# Patient Record
Sex: Female | Born: 1960 | Race: Black or African American | Hispanic: No | Marital: Single | Smoking: Former smoker
Health system: Southern US, Community
[De-identification: ages and names within clinical notes are randomized; demographics above are authoritative.]

## PROBLEM LIST (undated history)

## (undated) DIAGNOSIS — E785 Hyperlipidemia, unspecified: Secondary | ICD-10-CM

## (undated) DIAGNOSIS — I340 Nonrheumatic mitral (valve) insufficiency: Secondary | ICD-10-CM

## (undated) DIAGNOSIS — E872 Acidosis, unspecified: Secondary | ICD-10-CM

## (undated) DIAGNOSIS — I219 Acute myocardial infarction, unspecified: Secondary | ICD-10-CM

## (undated) DIAGNOSIS — D649 Anemia, unspecified: Secondary | ICD-10-CM

## (undated) DIAGNOSIS — G8929 Other chronic pain: Secondary | ICD-10-CM

## (undated) DIAGNOSIS — K649 Unspecified hemorrhoids: Secondary | ICD-10-CM

## (undated) DIAGNOSIS — E119 Type 2 diabetes mellitus without complications: Secondary | ICD-10-CM

## (undated) DIAGNOSIS — M2550 Pain in unspecified joint: Secondary | ICD-10-CM

## (undated) DIAGNOSIS — F419 Anxiety disorder, unspecified: Secondary | ICD-10-CM

## (undated) DIAGNOSIS — M4802 Spinal stenosis, cervical region: Secondary | ICD-10-CM

## (undated) DIAGNOSIS — E871 Hypo-osmolality and hyponatremia: Secondary | ICD-10-CM

## (undated) DIAGNOSIS — S92309A Fracture of unspecified metatarsal bone(s), unspecified foot, initial encounter for closed fracture: Secondary | ICD-10-CM

## (undated) DIAGNOSIS — I10 Essential (primary) hypertension: Secondary | ICD-10-CM

## (undated) DIAGNOSIS — Z5189 Encounter for other specified aftercare: Secondary | ICD-10-CM

## (undated) DIAGNOSIS — I639 Cerebral infarction, unspecified: Secondary | ICD-10-CM

## (undated) DIAGNOSIS — M3214 Glomerular disease in systemic lupus erythematosus: Secondary | ICD-10-CM

## (undated) DIAGNOSIS — K635 Polyp of colon: Secondary | ICD-10-CM

## (undated) DIAGNOSIS — I339 Acute and subacute endocarditis, unspecified: Secondary | ICD-10-CM

## (undated) DIAGNOSIS — R202 Paresthesia of skin: Secondary | ICD-10-CM

## (undated) DIAGNOSIS — K922 Gastrointestinal hemorrhage, unspecified: Secondary | ICD-10-CM

## (undated) DIAGNOSIS — M329 Systemic lupus erythematosus, unspecified: Secondary | ICD-10-CM

## (undated) DIAGNOSIS — J189 Pneumonia, unspecified organism: Secondary | ICD-10-CM

## (undated) DIAGNOSIS — M1990 Unspecified osteoarthritis, unspecified site: Secondary | ICD-10-CM

## (undated) DIAGNOSIS — IMO0002 Reserved for concepts with insufficient information to code with codable children: Secondary | ICD-10-CM

## (undated) DIAGNOSIS — N186 End stage renal disease: Secondary | ICD-10-CM

## (undated) DIAGNOSIS — I69398 Other sequelae of cerebral infarction: Secondary | ICD-10-CM

## (undated) DIAGNOSIS — I69359 Hemiplegia and hemiparesis following cerebral infarction affecting unspecified side: Secondary | ICD-10-CM

## (undated) DIAGNOSIS — M25579 Pain in unspecified ankle and joints of unspecified foot: Secondary | ICD-10-CM

## (undated) DIAGNOSIS — N052 Unspecified nephritic syndrome with diffuse membranous glomerulonephritis: Secondary | ICD-10-CM

## (undated) DIAGNOSIS — K529 Noninfective gastroenteritis and colitis, unspecified: Secondary | ICD-10-CM

## (undated) HISTORY — PX: TUBAL LIGATION: SHX77

## (undated) HISTORY — DX: Acidosis, unspecified: E87.20

## (undated) HISTORY — DX: Hemiplegia and hemiparesis following cerebral infarction affecting unspecified side: I69.359

## (undated) HISTORY — DX: Noninfective gastroenteritis and colitis, unspecified: K52.9

## (undated) HISTORY — DX: Gastrointestinal hemorrhage, unspecified: K92.2

## (undated) HISTORY — PX: COLONOSCOPY W/ BIOPSIES AND POLYPECTOMY: SHX1376

## (undated) HISTORY — DX: Acute myocardial infarction, unspecified: I21.9

## (undated) HISTORY — DX: Unspecified hemorrhoids: K64.9

## (undated) HISTORY — DX: Acidosis: E87.2

## (undated) HISTORY — DX: Hypo-osmolality and hyponatremia: E87.1

## (undated) HISTORY — DX: Hyperlipidemia, unspecified: E78.5

## (undated) HISTORY — DX: Other sequelae of cerebral infarction: I69.398

## (undated) HISTORY — PX: BREAST BIOPSY: SHX20

## (undated) HISTORY — DX: Polyp of colon: K63.5

---

## 1998-04-14 ENCOUNTER — Emergency Department (HOSPITAL_COMMUNITY): Admission: EM | Admit: 1998-04-14 | Discharge: 1998-04-14 | Payer: Self-pay | Admitting: Emergency Medicine

## 2000-05-18 ENCOUNTER — Encounter (INDEPENDENT_AMBULATORY_CARE_PROVIDER_SITE_OTHER): Payer: Self-pay | Admitting: *Deleted

## 2000-05-18 ENCOUNTER — Ambulatory Visit (HOSPITAL_COMMUNITY): Admission: RE | Admit: 2000-05-18 | Discharge: 2000-05-18 | Payer: Self-pay | Admitting: General Surgery

## 2000-05-18 ENCOUNTER — Encounter (HOSPITAL_BASED_OUTPATIENT_CLINIC_OR_DEPARTMENT_OTHER): Payer: Self-pay | Admitting: General Surgery

## 2000-09-03 ENCOUNTER — Emergency Department (HOSPITAL_COMMUNITY): Admission: EM | Admit: 2000-09-03 | Discharge: 2000-09-03 | Payer: Self-pay | Admitting: Internal Medicine

## 2005-11-01 ENCOUNTER — Ambulatory Visit (HOSPITAL_COMMUNITY): Admission: RE | Admit: 2005-11-01 | Discharge: 2005-11-01 | Payer: Self-pay | Admitting: Family Medicine

## 2006-02-09 ENCOUNTER — Emergency Department (HOSPITAL_COMMUNITY): Admission: EM | Admit: 2006-02-09 | Discharge: 2006-02-09 | Payer: Self-pay | Admitting: Emergency Medicine

## 2006-02-24 ENCOUNTER — Ambulatory Visit: Payer: Self-pay | Admitting: Gastroenterology

## 2006-03-12 ENCOUNTER — Encounter: Admission: RE | Admit: 2006-03-12 | Discharge: 2006-03-12 | Payer: Self-pay | Admitting: Internal Medicine

## 2006-04-01 ENCOUNTER — Ambulatory Visit: Payer: Self-pay | Admitting: Gastroenterology

## 2006-04-08 ENCOUNTER — Ambulatory Visit: Payer: Self-pay | Admitting: Gastroenterology

## 2006-04-08 ENCOUNTER — Encounter: Payer: Self-pay | Admitting: Gastroenterology

## 2006-04-08 DIAGNOSIS — D126 Benign neoplasm of colon, unspecified: Secondary | ICD-10-CM

## 2006-04-08 DIAGNOSIS — K649 Unspecified hemorrhoids: Secondary | ICD-10-CM | POA: Insufficient documentation

## 2006-06-02 ENCOUNTER — Encounter: Payer: Self-pay | Admitting: Cardiology

## 2006-06-02 ENCOUNTER — Ambulatory Visit: Payer: Self-pay

## 2006-06-23 ENCOUNTER — Ambulatory Visit (HOSPITAL_COMMUNITY): Admission: RE | Admit: 2006-06-23 | Discharge: 2006-06-23 | Payer: Self-pay | Admitting: Nephrology

## 2006-06-26 ENCOUNTER — Encounter: Admission: RE | Admit: 2006-06-26 | Discharge: 2006-06-26 | Payer: Self-pay | Admitting: Nephrology

## 2006-07-04 ENCOUNTER — Emergency Department (HOSPITAL_COMMUNITY): Admission: EM | Admit: 2006-07-04 | Discharge: 2006-07-04 | Payer: Self-pay | Admitting: Emergency Medicine

## 2006-07-08 ENCOUNTER — Encounter (INDEPENDENT_AMBULATORY_CARE_PROVIDER_SITE_OTHER): Payer: Self-pay | Admitting: Specialist

## 2006-07-08 ENCOUNTER — Ambulatory Visit (HOSPITAL_COMMUNITY): Admission: RE | Admit: 2006-07-08 | Discharge: 2006-07-09 | Payer: Self-pay | Admitting: Nephrology

## 2006-10-29 ENCOUNTER — Encounter: Payer: Self-pay | Admitting: Vascular Surgery

## 2006-10-29 ENCOUNTER — Ambulatory Visit (HOSPITAL_COMMUNITY): Admission: RE | Admit: 2006-10-29 | Discharge: 2006-10-29 | Payer: Self-pay | Admitting: Nephrology

## 2006-11-16 ENCOUNTER — Encounter (HOSPITAL_COMMUNITY): Admission: RE | Admit: 2006-11-16 | Discharge: 2007-02-12 | Payer: Self-pay | Admitting: Nephrology

## 2007-02-15 ENCOUNTER — Encounter (HOSPITAL_COMMUNITY): Admission: RE | Admit: 2007-02-15 | Discharge: 2007-05-16 | Payer: Self-pay | Admitting: Nephrology

## 2007-05-14 ENCOUNTER — Ambulatory Visit: Payer: Self-pay | Admitting: Gastroenterology

## 2007-06-23 ENCOUNTER — Ambulatory Visit: Payer: Self-pay | Admitting: Gastroenterology

## 2007-06-23 LAB — CONVERTED CEMR LAB
Fecal Occult Blood: NEGATIVE
Fecal Occult Blood: NEGATIVE
OCCULT 1: NEGATIVE
OCCULT 1: NEGATIVE
OCCULT 2: NEGATIVE
OCCULT 2: NEGATIVE
OCCULT 3: NEGATIVE
OCCULT 3: NEGATIVE
OCCULT 4: NEGATIVE
OCCULT 4: NEGATIVE
OCCULT 5: NEGATIVE
OCCULT 5: NEGATIVE

## 2007-08-19 ENCOUNTER — Encounter: Payer: Self-pay | Admitting: Infectious Diseases

## 2007-10-25 ENCOUNTER — Encounter: Payer: Self-pay | Admitting: Infectious Diseases

## 2007-10-25 ENCOUNTER — Ambulatory Visit: Payer: Self-pay | Admitting: Infectious Diseases

## 2007-10-25 DIAGNOSIS — M069 Rheumatoid arthritis, unspecified: Secondary | ICD-10-CM

## 2007-10-25 DIAGNOSIS — IMO0002 Reserved for concepts with insufficient information to code with codable children: Secondary | ICD-10-CM

## 2007-10-25 DIAGNOSIS — N766 Ulceration of vulva: Secondary | ICD-10-CM | POA: Insufficient documentation

## 2007-10-25 DIAGNOSIS — E119 Type 2 diabetes mellitus without complications: Secondary | ICD-10-CM | POA: Insufficient documentation

## 2007-10-25 DIAGNOSIS — M329 Systemic lupus erythematosus, unspecified: Secondary | ICD-10-CM | POA: Insufficient documentation

## 2007-10-25 DIAGNOSIS — E785 Hyperlipidemia, unspecified: Secondary | ICD-10-CM | POA: Insufficient documentation

## 2007-10-25 LAB — CONVERTED CEMR LAB
Chlamydia, DNA Probe: NEGATIVE
GC Probe Amp, Genital: NEGATIVE

## 2007-10-26 ENCOUNTER — Encounter: Payer: Self-pay | Admitting: Infectious Diseases

## 2007-10-26 LAB — CONVERTED CEMR LAB

## 2007-11-20 ENCOUNTER — Encounter: Payer: Self-pay | Admitting: Infectious Diseases

## 2007-12-16 ENCOUNTER — Encounter (INDEPENDENT_AMBULATORY_CARE_PROVIDER_SITE_OTHER): Payer: Self-pay | Admitting: *Deleted

## 2008-02-18 DIAGNOSIS — Z8639 Personal history of other endocrine, nutritional and metabolic disease: Secondary | ICD-10-CM | POA: Insufficient documentation

## 2008-02-18 DIAGNOSIS — G5790 Unspecified mononeuropathy of unspecified lower limb: Secondary | ICD-10-CM | POA: Insufficient documentation

## 2008-02-18 DIAGNOSIS — D509 Iron deficiency anemia, unspecified: Secondary | ICD-10-CM

## 2008-03-08 ENCOUNTER — Ambulatory Visit: Payer: Self-pay | Admitting: Infectious Diseases

## 2008-03-09 ENCOUNTER — Encounter: Payer: Self-pay | Admitting: Infectious Diseases

## 2008-05-02 ENCOUNTER — Telehealth: Payer: Self-pay | Admitting: Infectious Diseases

## 2008-10-22 ENCOUNTER — Ambulatory Visit: Payer: Self-pay | Admitting: Family Medicine

## 2008-10-22 ENCOUNTER — Observation Stay (HOSPITAL_COMMUNITY): Admission: EM | Admit: 2008-10-22 | Discharge: 2008-10-24 | Payer: Self-pay | Admitting: Emergency Medicine

## 2009-04-06 ENCOUNTER — Encounter (HOSPITAL_COMMUNITY): Admission: RE | Admit: 2009-04-06 | Discharge: 2009-07-05 | Payer: Self-pay | Admitting: Nephrology

## 2009-07-06 ENCOUNTER — Encounter (HOSPITAL_COMMUNITY): Admission: RE | Admit: 2009-07-06 | Discharge: 2009-10-04 | Payer: Self-pay | Admitting: Nephrology

## 2009-09-12 ENCOUNTER — Emergency Department (HOSPITAL_COMMUNITY): Admission: EM | Admit: 2009-09-12 | Discharge: 2009-09-13 | Payer: Self-pay | Admitting: Emergency Medicine

## 2009-10-05 ENCOUNTER — Encounter (HOSPITAL_COMMUNITY): Admission: RE | Admit: 2009-10-05 | Discharge: 2010-01-03 | Payer: Self-pay | Admitting: Nephrology

## 2010-01-09 ENCOUNTER — Encounter (HOSPITAL_COMMUNITY): Admission: RE | Admit: 2010-01-09 | Discharge: 2010-04-09 | Payer: Self-pay | Admitting: Nephrology

## 2010-04-10 ENCOUNTER — Encounter (HOSPITAL_COMMUNITY): Admission: RE | Admit: 2010-04-10 | Discharge: 2010-07-09 | Payer: Self-pay | Admitting: Nephrology

## 2010-07-12 ENCOUNTER — Encounter (HOSPITAL_COMMUNITY)
Admission: RE | Admit: 2010-07-12 | Discharge: 2010-10-10 | Payer: Self-pay | Source: Home / Self Care | Attending: Nephrology | Admitting: Nephrology

## 2010-10-11 ENCOUNTER — Encounter (HOSPITAL_COMMUNITY)
Admission: RE | Admit: 2010-10-11 | Discharge: 2010-12-03 | Payer: Self-pay | Source: Home / Self Care | Attending: Nephrology | Admitting: Nephrology

## 2010-11-18 LAB — POCT HEMOGLOBIN-HEMACUE: Hemoglobin: 11.7 g/dL — ABNORMAL LOW (ref 12.0–15.0)

## 2010-11-24 ENCOUNTER — Encounter: Payer: Self-pay | Admitting: Nephrology

## 2010-11-24 ENCOUNTER — Encounter: Payer: Self-pay | Admitting: Internal Medicine

## 2010-11-27 LAB — POCT HEMOGLOBIN-HEMACUE: Hemoglobin: 11.2 g/dL — ABNORMAL LOW (ref 12.0–15.0)

## 2010-12-10 ENCOUNTER — Other Ambulatory Visit: Payer: Self-pay

## 2010-12-10 ENCOUNTER — Encounter (HOSPITAL_COMMUNITY)
Admission: RE | Admit: 2010-12-10 | Discharge: 2010-12-10 | Disposition: A | Discharge status: Home / Self Care | Payer: PRIVATE HEALTH INSURANCE | Source: Ambulatory Visit | Attending: Nephrology | Admitting: Nephrology

## 2010-12-10 DIAGNOSIS — N183 Chronic kidney disease, stage 3 unspecified: Secondary | ICD-10-CM | POA: Insufficient documentation

## 2010-12-10 DIAGNOSIS — E785 Hyperlipidemia, unspecified: Secondary | ICD-10-CM | POA: Insufficient documentation

## 2010-12-10 DIAGNOSIS — D638 Anemia in other chronic diseases classified elsewhere: Secondary | ICD-10-CM | POA: Insufficient documentation

## 2010-12-12 LAB — POCT HEMOGLOBIN-HEMACUE: Hemoglobin: 9.4 g/dL — ABNORMAL LOW (ref 12.0–15.0)

## 2010-12-24 ENCOUNTER — Other Ambulatory Visit: Payer: Self-pay

## 2010-12-24 ENCOUNTER — Other Ambulatory Visit: Payer: Self-pay | Admitting: Nephrology

## 2010-12-24 ENCOUNTER — Encounter (HOSPITAL_COMMUNITY): Payer: PRIVATE HEALTH INSURANCE

## 2010-12-24 LAB — IRON AND TIBC
Iron: 25 ug/dL — ABNORMAL LOW (ref 42–135)
Saturation Ratios: 16 % — ABNORMAL LOW (ref 20–55)
TIBC: 156 ug/dL — ABNORMAL LOW (ref 250–470)
UIBC: 131 ug/dL

## 2010-12-25 LAB — POCT HEMOGLOBIN-HEMACUE: Hemoglobin: 12.1 g/dL (ref 12.0–15.0)

## 2011-01-04 ENCOUNTER — Emergency Department (HOSPITAL_COMMUNITY): Payer: PRIVATE HEALTH INSURANCE

## 2011-01-04 ENCOUNTER — Inpatient Hospital Stay (HOSPITAL_COMMUNITY)
Admission: EM | Admit: 2011-01-04 | Discharge: 2011-01-07 | DRG: 293 | Disposition: A | Discharge status: Home / Self Care | Payer: PRIVATE HEALTH INSURANCE | Attending: Family Medicine | Admitting: Family Medicine

## 2011-01-04 DIAGNOSIS — I10 Essential (primary) hypertension: Secondary | ICD-10-CM

## 2011-01-04 DIAGNOSIS — M069 Rheumatoid arthritis, unspecified: Secondary | ICD-10-CM | POA: Diagnosis present

## 2011-01-04 DIAGNOSIS — I5031 Acute diastolic (congestive) heart failure: Principal | ICD-10-CM | POA: Diagnosis present

## 2011-01-04 DIAGNOSIS — M329 Systemic lupus erythematosus, unspecified: Secondary | ICD-10-CM | POA: Diagnosis present

## 2011-01-04 DIAGNOSIS — I5033 Acute on chronic diastolic (congestive) heart failure: Secondary | ICD-10-CM

## 2011-01-04 DIAGNOSIS — N049 Nephrotic syndrome with unspecified morphologic changes: Secondary | ICD-10-CM

## 2011-01-04 DIAGNOSIS — N058 Unspecified nephritic syndrome with other morphologic changes: Secondary | ICD-10-CM | POA: Diagnosis present

## 2011-01-04 DIAGNOSIS — E119 Type 2 diabetes mellitus without complications: Secondary | ICD-10-CM | POA: Diagnosis present

## 2011-01-04 DIAGNOSIS — I509 Heart failure, unspecified: Secondary | ICD-10-CM | POA: Diagnosis present

## 2011-01-04 DIAGNOSIS — E876 Hypokalemia: Secondary | ICD-10-CM | POA: Diagnosis present

## 2011-01-04 LAB — DIFFERENTIAL
Basophils Absolute: 0 10*3/uL (ref 0.0–0.1)
Basophils Relative: 0 % (ref 0–1)
Eosinophils Absolute: 0.2 10*3/uL (ref 0.0–0.7)
Eosinophils Relative: 2 % (ref 0–5)
Lymphocytes Relative: 32 % (ref 12–46)
Lymphs Abs: 3.6 10*3/uL (ref 0.7–4.0)
Monocytes Absolute: 0.5 10*3/uL (ref 0.1–1.0)
Monocytes Relative: 4 % (ref 3–12)
Neutro Abs: 6.8 10*3/uL (ref 1.7–7.7)
Neutrophils Relative %: 61 % (ref 43–77)

## 2011-01-04 LAB — CBC
HCT: 37.9 % (ref 36.0–46.0)
Hemoglobin: 12.4 g/dL (ref 12.0–15.0)
MCH: 26.3 pg (ref 26.0–34.0)
MCHC: 32.7 g/dL (ref 30.0–36.0)
MCV: 80.3 fL (ref 78.0–100.0)
Platelets: 331 10*3/uL (ref 150–400)
RBC: 4.72 MIL/uL (ref 3.87–5.11)
RDW: 16.1 % — ABNORMAL HIGH (ref 11.5–15.5)
WBC: 11 10*3/uL — ABNORMAL HIGH (ref 4.0–10.5)

## 2011-01-04 LAB — URINE MICROSCOPIC-ADD ON

## 2011-01-04 LAB — URINALYSIS, ROUTINE W REFLEX MICROSCOPIC
Bilirubin Urine: NEGATIVE
Nitrite: NEGATIVE
Protein, ur: 100 mg/dL — AB
Specific Gravity, Urine: 1.009 (ref 1.005–1.030)
Urobilinogen, UA: 0.2 mg/dL (ref 0.0–1.0)

## 2011-01-04 LAB — CK TOTAL AND CKMB (NOT AT ARMC)
CK, MB: 5.3 ng/mL — ABNORMAL HIGH (ref 0.3–4.0)
Relative Index: 3.6 — ABNORMAL HIGH (ref 0.0–2.5)
Total CK: 146 U/L (ref 7–177)

## 2011-01-04 LAB — PROTIME-INR
INR: 0.94 (ref 0.00–1.49)
Prothrombin Time: 12.8 seconds (ref 11.6–15.2)

## 2011-01-04 LAB — POCT CARDIAC MARKERS
CKMB, poc: 5.7 ng/mL (ref 1.0–8.0)
Myoglobin, poc: 321 ng/mL (ref 12–200)
Troponin i, poc: 0.09 ng/mL (ref 0.00–0.09)

## 2011-01-04 LAB — BRAIN NATRIURETIC PEPTIDE: Pro B Natriuretic peptide (BNP): 1943 pg/mL — ABNORMAL HIGH (ref 0.0–100.0)

## 2011-01-04 LAB — BASIC METABOLIC PANEL
BUN: 11 mg/dL (ref 6–23)
Chloride: 108 mEq/L (ref 96–112)
Creatinine, Ser: 0.76 mg/dL (ref 0.4–1.2)
Glucose, Bld: 140 mg/dL — ABNORMAL HIGH (ref 70–99)
Potassium: 2.4 mEq/L — CL (ref 3.5–5.1)

## 2011-01-04 LAB — D-DIMER, QUANTITATIVE: D-Dimer, Quant: 8.4 ug/mL-FEU — ABNORMAL HIGH (ref 0.00–0.48)

## 2011-01-04 LAB — TROPONIN I: Troponin I: 0.06 ng/mL (ref 0.00–0.06)

## 2011-01-05 DIAGNOSIS — R0602 Shortness of breath: Secondary | ICD-10-CM

## 2011-01-05 DIAGNOSIS — I509 Heart failure, unspecified: Secondary | ICD-10-CM

## 2011-01-05 LAB — CBC
HCT: 30 % — ABNORMAL LOW (ref 36.0–46.0)
Hemoglobin: 9.9 g/dL — ABNORMAL LOW (ref 12.0–15.0)
Hemoglobin: 9.9 g/dL — ABNORMAL LOW (ref 12.0–15.0)
MCH: 26.1 pg (ref 26.0–34.0)
MCHC: 33 g/dL (ref 30.0–36.0)
MCHC: 33 g/dL (ref 30.0–36.0)
MCV: 79.1 fL (ref 78.0–100.0)
RBC: 3.83 MIL/uL — ABNORMAL LOW (ref 3.87–5.11)
RBC: 3.81 MIL/uL — ABNORMAL LOW (ref 3.87–5.11)
WBC: 8.1 10*3/uL (ref 4.0–10.5)

## 2011-01-05 LAB — CARDIAC PANEL(CRET KIN+CKTOT+MB+TROPI): Total CK: 194 U/L — ABNORMAL HIGH (ref 7–177)

## 2011-01-05 LAB — GLUCOSE, CAPILLARY
Glucose-Capillary: 116 mg/dL — ABNORMAL HIGH (ref 70–99)
Glucose-Capillary: 143 mg/dL — ABNORMAL HIGH (ref 70–99)
Glucose-Capillary: 100 mg/dL — ABNORMAL HIGH (ref 70–99)
Glucose-Capillary: 118 mg/dL — ABNORMAL HIGH (ref 70–99)

## 2011-01-05 LAB — BASIC METABOLIC PANEL
BUN: 10 mg/dL (ref 6–23)
CO2: 24 mEq/L (ref 19–32)
CO2: 23 mEq/L (ref 19–32)
Calcium: 7.6 mg/dL — ABNORMAL LOW (ref 8.4–10.5)
Calcium: 7.6 mg/dL — ABNORMAL LOW (ref 8.4–10.5)
Chloride: 111 mEq/L (ref 96–112)
Creatinine, Ser: 0.93 mg/dL (ref 0.4–1.2)
GFR calc Af Amer: >60 mL/min (ref >60)
GFR calc Af Amer: >60 mL/min (ref >60)
Glucose, Bld: 85 mg/dL (ref 70–99)
Glucose, Bld: 137 mg/dL — ABNORMAL HIGH (ref 70–99)
Potassium: 3.4 mEq/L — ABNORMAL LOW (ref 3.5–5.1)
Sodium: 138 mEq/L (ref 135–145)

## 2011-01-05 LAB — PHOSPHORUS: Phosphorus: 4.8 mg/dL — ABNORMAL HIGH (ref 2.3–4.6)

## 2011-01-05 LAB — MAGNESIUM: Magnesium: 1.5 mg/dL (ref 1.5–2.5)

## 2011-01-06 DIAGNOSIS — I059 Rheumatic mitral valve disease, unspecified: Secondary | ICD-10-CM

## 2011-01-06 LAB — COMPREHENSIVE METABOLIC PANEL
AST: 22 U/L (ref 0–37)
Albumin: 1.2 g/dL — ABNORMAL LOW (ref 3.5–5.2)
Alkaline Phosphatase: 51 U/L (ref 39–117)
CO2: 26 mEq/L (ref 19–32)
Chloride: 112 mEq/L (ref 96–112)
GFR calc Af Amer: >60 mL/min (ref >60)
GFR calc non Af Amer: 54 mL/min — ABNORMAL LOW (ref >60)
Potassium: 3.1 mEq/L — ABNORMAL LOW (ref 3.5–5.1)
Total Bilirubin: 0.2 mg/dL — ABNORMAL LOW (ref 0.3–1.2)

## 2011-01-06 LAB — CBC
Hemoglobin: 9.4 g/dL — ABNORMAL LOW (ref 12.0–15.0)
MCV: 79 fL (ref 78.0–100.0)
Platelets: 230 10*3/uL (ref 150–400)
RBC: 3.66 MIL/uL — ABNORMAL LOW (ref 3.87–5.11)
WBC: 6.1 10*3/uL (ref 4.0–10.5)

## 2011-01-06 LAB — PROTEIN / CREATININE RATIO, URINE: Protein Creatinine Ratio: 4.75 — ABNORMAL HIGH (ref 0.00–0.15)

## 2011-01-07 ENCOUNTER — Inpatient Hospital Stay (HOSPITAL_COMMUNITY): Payer: PRIVATE HEALTH INSURANCE

## 2011-01-07 ENCOUNTER — Encounter (HOSPITAL_COMMUNITY): Payer: PRIVATE HEALTH INSURANCE | Attending: Nephrology

## 2011-01-07 DIAGNOSIS — D638 Anemia in other chronic diseases classified elsewhere: Secondary | ICD-10-CM | POA: Insufficient documentation

## 2011-01-07 DIAGNOSIS — N183 Chronic kidney disease, stage 3 unspecified: Secondary | ICD-10-CM | POA: Insufficient documentation

## 2011-01-07 DIAGNOSIS — E785 Hyperlipidemia, unspecified: Secondary | ICD-10-CM | POA: Insufficient documentation

## 2011-01-07 LAB — GLUCOSE, CAPILLARY
Glucose-Capillary: 128 mg/dL — ABNORMAL HIGH (ref 70–99)
Glucose-Capillary: 77 mg/dL (ref 70–99)
Glucose-Capillary: 95 mg/dL (ref 70–99)

## 2011-01-07 LAB — CBC
MCH: 26.1 pg (ref 26.0–34.0)
MCHC: 32.3 g/dL (ref 30.0–36.0)
MCV: 80.7 fL (ref 78.0–100.0)
Platelets: 220 10*3/uL (ref 150–400)

## 2011-01-07 LAB — BASIC METABOLIC PANEL
BUN: 13 mg/dL (ref 6–23)
Creatinine, Ser: 0.92 mg/dL (ref 0.4–1.2)
GFR calc non Af Amer: >60 mL/min (ref >60)
Glucose, Bld: 78 mg/dL (ref 70–99)

## 2011-01-07 LAB — BRAIN NATRIURETIC PEPTIDE: Pro B Natriuretic peptide (BNP): 687 pg/mL — ABNORMAL HIGH (ref 0.0–100.0)

## 2011-01-10 LAB — ALDOSTERONE: Aldosterone, Serum: 2 ng/dL

## 2011-01-10 NOTE — Consult Note (Signed)
NAMEGEMA, [name] [name]                ACCOUNT NO.:  [id number]  MEDICAL RECORD NO.:  [id number]           PATIENT TYPE:  I  LOCATION:  3315                         FACILITY:  [facility]  PHYSICIAN:  Learta Codding, MD,FACC DATE OF BIRTH:  Mar 02, 1961  DATE OF CONSULTATION: DATE OF DISCHARGE:                                CONSULTATION   PRIMARY CARE PHYSICIAN:  Jonita Albee, M.D. at [facility].  CONSULTING PHYSICIAN:  Pearlean Brownie, M.D.  REASON FOR CONSULTATION:  Evaluation of a 50 year old female with new- onset congestive heart failure.  HISTORY OF PRESENT ILLNESS:  The patient is a 50 year old female who is actually followed by Dr. Hyman Hopes with [facility] for hypertension and lupus.  She also has a history of diabetes mellitus.  The patient was admitted yesterday because of acute onset of shortness of breath. Reportedly, while she was gambling she took a break to go to the bathroom and when she went back to her machine she suddenly found herself short of breath.  She called 911.  As EMS arrived on the scene, they found the saturation of 70% and the patient was placed on BiPAP. In the emergency room, the patient was in acute respiratory distress, but was markedly hypertensive with systolic blood pressures over 409 mmHg.  She rapidly improved with nitroglycerin and intravenous Lasix.  A chest x-ray demonstrated bilateral infiltrates consistent with congestive heart failure.  Her BNP level was markedly elevated.  The patient did apparently undergo a CT angiogram to rule out pulmonary embolism and this was negative for PE.  I reviewed this study myself and the patient appears to have cardiomegaly with significant left ventricular hypertrophy based on the CT scan findings.  I did not see any significant coronary calcifications.  The patient also was anemic with hemoglobin of 9.9 and severely hypokalemic with a potassium of 2.4. Her BNP level as outlined above is elevated  and was 1943.  Today, I did a bedside echocardiogram and the patient has significant left ventricular hypertrophy, albeit with only low normal ejection fraction of about 50-55%, but with moderate mitral regurgitation.  The mitral leaflets appeared to be intact without any significant prolapse. The patient is actually doing much better today.  We have asked the patient to be evaluated for her new-onset congestive heart failure.  She states that she is currently not short of breath.  She denied any episodes of substernal chest pain.  Her initial troponin was 0.09, the second troponin was 0.27.  ALLERGIES:  NO KNOWN DRUG ALLERGIES.  MEDICATIONS ON ADMISSION: 1. Labetalol 200 mg p.o. b.i.d. 2. Potassium 20 mg p.o. b.i.d. 3. Multivitamin. 4. Lisinopril 20 mg p.o. b.i.d. 5. Furosemide 20 mg p.o. b.i.d. 6. CellCept 250 mg 4 tablets twice a day. 7. Amlodipine 10 mg p.o. daily.  PAST MEDICAL HISTORY:  History of hypertension, diabetes, and lupus as well as rheumatoid arthritis and chronic anemia.  PAST SURGICAL HISTORY:  Left breast biopsy.  SOCIAL HISTORY:  The patient is with three children, works as an Health and safety inspector, smoked cigarettes today.  She has 2 drinks of alcohol per year.  She denies any drug use.  FAMILY HISTORY:  Mother with diabetes and hypertension.  Father, alcohol abuse and myocardial infarction.  Siblings are otherwise healthy.  REVIEW OF SYSTEMS:  The patient denies any fever or chills.  Denies any melena or hematochezia.  No dysuria or frequency.  She did say that she had over the last several days increased edema in the lower extremities. She also has more unilateral edema in the left leg.  She reports myalgias and arthralgias.  She has some visual changes with blurred vision over the last several weeks.  PHYSICAL EXAMINATION:  VITAL SIGNS:  Blood pressure is 162/89, heart rate is 74 beats per minute, respiratory rate is 19, saturation 99%, temperature is  98.5. GENERAL:  No acute distress. HEENT:  PERLA.  EOMI.  Poor dentition. NECK:  Normal carotid upstroke and no carotid bruits.  No thyromegaly. No nodular thyroids.  JVD is approximately 7 cm.  No cervical or supraclavicular adenopathy. HEART:  Regular rate and rhythm with a 2-3/6 holosystolic murmur at the apex. LUNGS:  Clear breath sounds bilaterally with few crackles at the bases. ABDOMEN:  Soft, nontender.  No rebound or guarding.  Good bowel sounds. EXTREMITIES:  No cyanosis or clubbing.  There is unilateral edema in the left leg and less edema in the right leg. NEUROLOGIC:  The patient is alert, oriented, and grossly nonfocal. SKIN:  Warm and dry. PSYCHIATRIC:  Normal affect.  LABORATORY WORK:  White count is 9.9, hemoglobin is 9.9, hematocrit 30.3, platelet count is 241.  Potassium is 3.3, sodium is 140, creatinine is 0.93, GFR is greater than 60, calcium 7.6, phosphorus 4.8, magnesium is 1.5.  Troponins as listed above are 0.06 and 0.27 respectively.  BNP is 1943.  UA showed moderate amount of blood and positive for protein.  Chest x-ray and CT angiogram as described above.  The 12-lead electrocardiogram demonstrates normal sinus rhythm.  I cannot rule out septal infarct pattern.  There is also significant QT prolongation up to 587 milliseconds.  This is in the setting of hypokalemia.  PROBLEM LIST: 1. Acute dyspnea with hypoxemia.     a.     Apparent acute diastolic heart failure.     b.     Bedside echocardiogram; ejection fraction of 50-55% with      severe left ventricular hypertrophy.     c.     Hypertensive heart disease. 2. Hypertension, poorly controlled.  The patient is being restarted on     her medications.  I would also suggest to add reserpine to her     medical regimen. 3. Diabetes mellitus, followed by the primary service. 4. Systolic murmur consistent with mitral regurgitation. 5. QTc prolongation, likely secondary to hypokalemia.  PLAN: 1. At this  point, I would recommend to obtain a formal echocardiogram     although a bedside echocardiogram reveals that the patient's     ejection fraction is relatively preserved and I suspect that her     acute heart failure is secondary to hypertensive heart disease.     She needs more aggressive control of her blood pressure.  I would     recommend to add reserpine to her medical regimen in addition to     what she is currently taking. 2. The patient does have unilateral edema and has pain in the left     lower extremity and I would obtain lower extremity venous Dopplers. 3. Although the EKG is consistent with an old septal infarct pattern  as suspected in the setting of severe left ventricular hypertrophy     and at this point, I do not think the patient needs necessarily a     cardiac catheterization.  I suspect her troponin is less elevated     in the setting of acute diastolic heart failure.  Prior to hospital     discharge, I would recommend an ischemia evaluation with nuclear     perfusion study. 4. Mitral regurgitation is likely low dependent and I did not see     intrinsically anything wrong with mitral valve with the bedside     echocardiogram, but further recommendation will be pending the     formal echocardiogram.  In the interim, medical therapy as     prescribed by the primary service is appropriate.     Learta Codding, MD,FACC     GED/MEDQ  D:  01/05/2011  T:  01/05/2011  Job:  [id number]  cc:   Jonita Albee, M.D. Pearlean Brownie, M.D.  Electronically Signed by Lewayne Bunting MDFACC on 01/09/2011 03:48:31 PM

## 2011-01-11 LAB — CULTURE, BLOOD (ROUTINE X 2)
Culture  Setup Time: 201203040034
Culture: NO GROWTH

## 2011-01-13 LAB — IRON AND TIBC
Iron: 16 ug/dL — ABNORMAL LOW (ref 42–135)
Saturation Ratios: 11 % — ABNORMAL LOW (ref 20–55)
TIBC: 148 ug/dL — ABNORMAL LOW (ref 250–470)

## 2011-01-13 LAB — POCT HEMOGLOBIN-HEMACUE: Hemoglobin: 9.6 g/dL — ABNORMAL LOW (ref 12.0–15.0)

## 2011-01-13 NOTE — Discharge Summary (Signed)
[name], [name]                ACCOUNT NO.:  [id number]  MEDICAL RECORD NO.:  [id number]           PATIENT TYPE:  I  LOCATION:  3315                         FACILITY:  [facility]  PHYSICIAN:  Leighton Roach Silveria Botz, M.D.DATE OF BIRTH:  1960-11-26  DATE OF ADMISSION:  01/04/2011 DATE OF DISCHARGE:  01/07/2011                              DISCHARGE SUMMARY   PRIMARY CARE PROVIDER:  [facility].  DISCHARGE DIAGNOSES: 1. Diastolic heart failure exacerbation. 2. Hypertension. 3. Lupus. 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  DISCHARGE MEDICATIONS: 1. Aspirin 81 mg p.o. daily. 2. Metoprolol 50 mg p.o. b.i.d. 3. Reserpine 0.1 mg p.o. daily. 4. Spironolactone 25 mg p.o. daily. 5. Lasix 40 mg p.o. daily. 6. Amlodipine 10 mg p.o. daily. 7. CellCept 250 mg 4 capsules p.o. b.i.d. 8. Lisinopril 20 mg p.o. b.i.d. 9. Multivitamins 1 tablet p.o. daily.  DISCONTINUED MEDICATIONS: 1. Potassium chloride 20 mEq 1 tablet p.o. b.i.d. 2. Labetalol 200 mg 2 tablets p.o. b.i.d.  CONSULTANTS:  Cardiology.  PERTINENT LABORATORY VALUES:  January 04, 2011, at 1819, troponin I 0.06. On January 05, 2011, at 4:55 a.m. troponin I 0.27, CK-MB 7.8.  On January 04, 2011, a D-dimer 8.4, beta natriuretic peptide 1943.0.  On January 07, 2011, beta natriuretic peptide 687.  RADIOLOGY:  On January 04, 2011, a chest x-ray, portable, one-view showed diffuse bilateral airspace disease and left effusion, most likely due to heart failure and edema underlying pneumonia cannot be excluded.  CT angiogram of the chest showed no evidence of pulmonary embolism.  It shows infiltrates bilaterally with associated pleural effusion.  On January 07, 2011, a chest x-ray, two-view showed improved pulmonary edema with persistent small bilateral pleural effusions and basilar airspace disease, both worse on the left.  On January 06, 2011, an echocardiogram showed systolic function normal with EF 55-60%.  Aortic valve, mild regurgitation.   Mitral valve, mild-to-moderate regurgitation.  Mildly dilated left atrium and pulmonary arteries PA peak pressure of 40 mmHg.  BRIEF HOSPITAL COURSE:  [name] [name] is a 50 year old female with past medical history of lupus, lupus nephritis, hypertension, and diabetes that is diet controlled who presented to the hospital with shortness of breath. 1. Shortness of breath.  The patient's chest x-ray indicated pulmonary     edema.  There is concern for congestive heart failure exacerbation,     although she did not carry a prior diagnosis.  There was also a     concern for pulmonary embolism as her D-dimer was elevated.  This     was ruled out with a CTA of the chest.  The patient's cardiac     enzymes were cycled.  The troponin was elevated with a peak at     0.27.  Cardiology was consulted who felt that the elevation in     troponin was due to congestive heart failure exacerbation.  An     echocardiogram was done that showed diastolic heart failure.  The     patient was diuresed with Lasix and her shortness of breath     improved.  On the day of discharge, she was euvolemic.  She was on     room air breathing comfortably, had no complaints of shortness of     breath. 2. Hypertension.  The patient has poorly-controlled hypertension.  On     admission, she was placed on some of her home medications.  She was     changed from labetalol to metoprolol as there is better evidence     for improved mortality with this beta-blocker.  Reserpine 0.1 mg     p.o. daily was added to her blood pressure regimen and     spironolactone 25 mg p.o. daily was also added, amlodipine and     lisinopril were continued during this hospitalization and at     discharge. 3. Hypokalemia.  The patient has had a history of low potassium before     she is chronically on Lasix.  Lasix was continued at discharge,     however, spironolactone was also added and her p.o. potassium     chloride supplement was discontinued.   We would ask Dr. Perrin Maltese at     [facility] to check a basic metabolic panel to ensure the patient's     potassium is within an acceptable range.  Our hope is that the     Lasix and spironolactone will balance out her potassium and she     will not have to take the supplement. 4. Lupus with kidney disease.  The patient's albumin was found to be     very low during this hospitalization.  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  She takes     CellCept for lupus.  Her creatinine was stable throughout     hospitalization.  However, it is thought that some of her lower     extremity edema may be complicated by her low albumin.  The patient     has a scheduled followup appointment with Dr. Hyman Hopes, her     nephrologist.  She was encouraged to keep this appointment during     after hospitalization.  FOLLOWUP ISSUES AND RECOMMENDATIONS:  The patient is to follow up with Dr. Perrin Maltese, [facility], in 1 week after discharge.  We would ask him to get a basic metabolic panel to follow up the patient's creatinine and potassium after medication changes.  The patient is to follow up with Dr. Hyman Hopes at [facility] in March 2012.  DISCHARGE CONDITION:  The patient was discharged home in stable medical condition.    ______________________________ Ardyth Gal, MD   ______________________________ Leighton Roach Enmanuel Zufall, M.D.    CR/MEDQ  D:  01/08/2011  T:  01/09/2011  Job:  [id number]  cc:   Jonita Albee, M.D. Garnetta Buddy, M.D.  Electronically Signed by Ardyth Gal MD on 01/12/2011 12:27:47 PM Electronically Signed by Acquanetta Belling M.D. on 01/13/2011 02:21:03 PM

## 2011-01-14 LAB — POCT HEMOGLOBIN-HEMACUE: Hemoglobin: 8.1 g/dL — ABNORMAL LOW (ref 12.0–15.0)

## 2011-01-15 LAB — IRON AND TIBC
Saturation Ratios: 6 % — ABNORMAL LOW (ref 20–55)
UIBC: 183 ug/dL

## 2011-01-16 LAB — IRON AND TIBC
Iron: 17 ug/dL — ABNORMAL LOW (ref 42–135)
UIBC: 159 ug/dL

## 2011-01-16 LAB — POCT HEMOGLOBIN-HEMACUE
Hemoglobin: 8.6 g/dL — ABNORMAL LOW (ref 12.0–15.0)
Hemoglobin: 8.6 g/dL — ABNORMAL LOW (ref 12.0–15.0)

## 2011-01-17 LAB — POCT HEMOGLOBIN-HEMACUE: Hemoglobin: 8.2 g/dL — ABNORMAL LOW (ref 12.0–15.0)

## 2011-01-18 LAB — IRON AND TIBC
Iron: 28 ug/dL — ABNORMAL LOW (ref 42–135)
Iron: 32 ug/dL — ABNORMAL LOW (ref 42–135)
Saturation Ratios: 18 % — ABNORMAL LOW (ref 20–55)
Saturation Ratios: 22 % (ref 20–55)
TIBC: 143 ug/dL — ABNORMAL LOW (ref 250–470)
UIBC: 132 ug/dL

## 2011-01-18 LAB — RENAL FUNCTION PANEL
CO2: 22 mEq/L (ref 19–32)
CO2: 24 mEq/L (ref 19–32)
Calcium: 7.9 mg/dL — ABNORMAL LOW (ref 8.4–10.5)
Chloride: 109 mEq/L (ref 96–112)
Creatinine, Ser: 0.46 mg/dL (ref 0.4–1.2)
Creatinine, Ser: 0.58 mg/dL (ref 0.4–1.2)
GFR calc Af Amer: >60 mL/min (ref >60)
GFR calc non Af Amer: >60 mL/min (ref >60)
Glucose, Bld: 112 mg/dL — ABNORMAL HIGH (ref 70–99)
Potassium: 3.6 mEq/L (ref 3.5–5.1)
Sodium: 139 mEq/L (ref 135–145)

## 2011-01-18 LAB — CBC
Hemoglobin: 10.1 g/dL — ABNORMAL LOW (ref 12.0–15.0)
MCHC: 32.8 g/dL (ref 30.0–36.0)
Platelets: 307 10*3/uL (ref 150–400)
RDW: 19.1 % — ABNORMAL HIGH (ref 11.5–15.5)

## 2011-01-18 LAB — POCT HEMOGLOBIN-HEMACUE: Hemoglobin: 9.6 g/dL — ABNORMAL LOW (ref 12.0–15.0)

## 2011-01-19 LAB — POCT HEMOGLOBIN-HEMACUE
Hemoglobin: 10.1 g/dL — ABNORMAL LOW (ref 12.0–15.0)
Hemoglobin: 9.5 g/dL — ABNORMAL LOW (ref 12.0–15.0)
Hemoglobin: 9.8 g/dL — ABNORMAL LOW (ref 12.0–15.0)

## 2011-01-20 LAB — POCT HEMOGLOBIN-HEMACUE
Hemoglobin: 12.5 g/dL (ref 12.0–15.0)
Hemoglobin: 10.7 g/dL — ABNORMAL LOW (ref 12.0–15.0)
Hemoglobin: 11.7 g/dL — ABNORMAL LOW (ref 12.0–15.0)

## 2011-01-20 LAB — IRON AND TIBC

## 2011-01-21 LAB — FERRITIN: Ferritin: 41 ng/mL (ref 10–291)

## 2011-01-21 LAB — POCT HEMOGLOBIN-HEMACUE
Hemoglobin: 10.2 g/dL — ABNORMAL LOW (ref 12.0–15.0)
Hemoglobin: 10.4 g/dL — ABNORMAL LOW (ref 12.0–15.0)
Hemoglobin: 9.4 g/dL — ABNORMAL LOW (ref 12.0–15.0)
Hemoglobin: 9.6 g/dL — ABNORMAL LOW (ref 12.0–15.0)

## 2011-01-21 LAB — IRON AND TIBC
Iron: 11 ug/dL — ABNORMAL LOW (ref 42–135)
TIBC: 121 ug/dL — ABNORMAL LOW (ref 250–470)

## 2011-01-21 LAB — RENAL FUNCTION PANEL
BUN: 10 mg/dL (ref 6–23)
CO2: 26 mEq/L (ref 19–32)
Chloride: 106 mEq/L (ref 96–112)
Creatinine, Ser: 0.58 mg/dL (ref 0.4–1.2)
Glucose, Bld: 84 mg/dL (ref 70–99)
Potassium: 3.5 mEq/L (ref 3.5–5.1)

## 2011-01-22 ENCOUNTER — Encounter (HOSPITAL_COMMUNITY): Payer: PRIVATE HEALTH INSURANCE

## 2011-01-22 LAB — POCT HEMOGLOBIN-HEMACUE
Hemoglobin: 8.9 g/dL — ABNORMAL LOW (ref 12.0–15.0)
Hemoglobin: 9.2 g/dL — ABNORMAL LOW (ref 12.0–15.0)

## 2011-01-22 LAB — FERRITIN: Ferritin: 50 ng/mL (ref 10–291)

## 2011-01-22 LAB — IRON AND TIBC
Iron: 20 ug/dL — ABNORMAL LOW (ref 42–135)
Iron: <10 ug/dL — ABNORMAL LOW (ref 42–135)
TIBC: 100 ug/dL — ABNORMAL LOW (ref 250–470)
UIBC: 135 ug/dL

## 2011-01-24 LAB — RENAL FUNCTION PANEL
Albumin: 1.3 g/dL — ABNORMAL LOW (ref 3.5–5.2)
BUN: 10 mg/dL (ref 6–23)
Calcium: 8.2 mg/dL — ABNORMAL LOW (ref 8.4–10.5)
Chloride: 112 mEq/L (ref 96–112)
Creatinine, Ser: 0.56 mg/dL (ref 0.4–1.2)
GFR calc Af Amer: >60 mL/min (ref >60)
GFR calc non Af Amer: >60 mL/min (ref >60)

## 2011-01-24 LAB — IRON AND TIBC
Iron: 47 ug/dL (ref 42–135)
UIBC: 101 ug/dL

## 2011-01-24 LAB — FERRITIN: Ferritin: 86 ng/mL (ref 10–291)

## 2011-01-24 LAB — POCT HEMOGLOBIN-HEMACUE
Hemoglobin: 8.3 g/dL — ABNORMAL LOW (ref 12.0–15.0)
Hemoglobin: 9.2 g/dL — ABNORMAL LOW (ref 12.0–15.0)

## 2011-02-03 LAB — BASIC METABOLIC PANEL
CO2: 27 mEq/L (ref 19–32)
Chloride: 104 mEq/L (ref 96–112)
Creatinine, Ser: 0.45 mg/dL (ref 0.4–1.2)
GFR calc Af Amer: >60 mL/min (ref >60)
Glucose, Bld: 120 mg/dL — ABNORMAL HIGH (ref 70–99)
Sodium: 138 mEq/L (ref 135–145)

## 2011-02-04 ENCOUNTER — Encounter (HOSPITAL_COMMUNITY): Payer: PRIVATE HEALTH INSURANCE | Attending: Nephrology

## 2011-02-04 ENCOUNTER — Other Ambulatory Visit: Payer: Self-pay | Admitting: Nephrology

## 2011-02-04 DIAGNOSIS — N183 Chronic kidney disease, stage 3 unspecified: Secondary | ICD-10-CM | POA: Insufficient documentation

## 2011-02-04 DIAGNOSIS — D638 Anemia in other chronic diseases classified elsewhere: Secondary | ICD-10-CM | POA: Insufficient documentation

## 2011-02-04 DIAGNOSIS — E785 Hyperlipidemia, unspecified: Secondary | ICD-10-CM | POA: Insufficient documentation

## 2011-02-04 LAB — RENAL FUNCTION PANEL
Albumin: 1.2 g/dL — ABNORMAL LOW (ref 3.5–5.2)
BUN: 9 mg/dL (ref 6–23)
Calcium: 7.9 mg/dL — ABNORMAL LOW (ref 8.4–10.5)
Creatinine, Ser: 0.5 mg/dL (ref 0.4–1.2)
GFR calc Af Amer: >60 mL/min (ref >60)
GFR calc non Af Amer: >60 mL/min (ref >60)
Phosphorus: 3.3 mg/dL (ref 2.3–4.6)

## 2011-02-04 LAB — IRON AND TIBC
Saturation Ratios: 27 % (ref 20–55)
TIBC: 139 ug/dL — ABNORMAL LOW (ref 250–470)

## 2011-02-04 LAB — MAGNESIUM: Magnesium: 1.7 mg/dL (ref 1.5–2.5)

## 2011-02-04 LAB — FERRITIN: Ferritin: 196 ng/mL (ref 10–291)

## 2011-02-05 LAB — ANTIPHOSPHOLIPID SYNDROME EVAL, BLD
Anticardiolipin IgA: 4 APL U/mL — ABNORMAL LOW (ref <10)
Anticardiolipin IgG: <10 GPL U/mL — ABNORMAL LOW (ref <10)
Anticardiolipin IgM: <3 MPL U/mL — ABNORMAL LOW (ref <10)
DRVVT: 34 secs — ABNORMAL LOW (ref 34.7–40.5)
Lupus Anticoagulant: NOT DETECTED
PTT Lupus Anticoagulant: 55.7 secs — ABNORMAL HIGH (ref 32.0–43.4)
PTTLA 4:1 Mix: 49 secs — ABNORMAL HIGH (ref 36.3–48.8)
PTTLA Confirmation: 4.1 secs (ref <8.0)

## 2011-02-05 LAB — URINE MICROSCOPIC-ADD ON

## 2011-02-05 LAB — DIFFERENTIAL
Basophils Absolute: 0 10*3/uL (ref 0.0–0.1)
Basophils Relative: 0 % (ref 0–1)
Eosinophils Absolute: 0 10*3/uL (ref 0.0–0.7)
Eosinophils Relative: 0 % (ref 0–5)
Lymphocytes Relative: 10 % — ABNORMAL LOW (ref 12–46)
Lymphs Abs: 1.5 10*3/uL (ref 0.7–4.0)
Monocytes Absolute: 0.5 10*3/uL (ref 0.1–1.0)
Monocytes Relative: 3 % (ref 3–12)
Neutro Abs: 13.5 10*3/uL — ABNORMAL HIGH (ref 1.7–7.7)
Neutrophils Relative %: 87 % — ABNORMAL HIGH (ref 43–77)

## 2011-02-05 LAB — URINALYSIS, ROUTINE W REFLEX MICROSCOPIC
Ketones, ur: NEGATIVE mg/dL
Leukocytes, UA: NEGATIVE
Nitrite: NEGATIVE
Protein, ur: >300 mg/dL — AB

## 2011-02-05 LAB — ANA: Anti Nuclear Antibody(ANA): POSITIVE — AB

## 2011-02-05 LAB — RENAL FUNCTION PANEL
Albumin: 1.2 g/dL — ABNORMAL LOW (ref 3.5–5.2)
Chloride: 106 mEq/L (ref 96–112)
Creatinine, Ser: 0.31 mg/dL — ABNORMAL LOW (ref 0.4–1.2)
GFR calc Af Amer: >60 mL/min (ref >60)
GFR calc non Af Amer: >60 mL/min (ref >60)
Potassium: 3.2 mEq/L — ABNORMAL LOW (ref 3.5–5.1)
Sodium: 136 mEq/L (ref 135–145)

## 2011-02-05 LAB — ANTI-NUCLEAR AB-TITER (ANA TITER): ANA Titer 1: 1:640 {titer} — ABNORMAL HIGH

## 2011-02-05 LAB — CBC
HCT: 32.6 % — ABNORMAL LOW (ref 36.0–46.0)
MCV: 82.3 fL (ref 78.0–100.0)
Platelets: 287 10*3/uL (ref 150–400)
RDW: 18.9 % — ABNORMAL HIGH (ref 11.5–15.5)

## 2011-02-05 LAB — SEDIMENTATION RATE: Sed Rate: 85 mm/hr — ABNORMAL HIGH (ref 0–22)

## 2011-02-05 LAB — BASIC METABOLIC PANEL
BUN: 9 mg/dL (ref 6–23)
Creatinine, Ser: 0.54 mg/dL (ref 0.4–1.2)
GFR calc non Af Amer: >60 mL/min (ref >60)
Glucose, Bld: 82 mg/dL (ref 70–99)
Potassium: 2.4 mEq/L — CL (ref 3.5–5.1)

## 2011-02-05 LAB — C4 COMPLEMENT: Complement C4, Body Fluid: 17 mg/dL (ref 16–47)

## 2011-02-05 LAB — POCT HEMOGLOBIN-HEMACUE
Hemoglobin: 10.1 g/dL — ABNORMAL LOW (ref 12.0–15.0)
Hemoglobin: 11.5 g/dL — ABNORMAL LOW (ref 12.0–15.0)
Hemoglobin: 8.4 g/dL — ABNORMAL LOW (ref 12.0–15.0)

## 2011-02-05 LAB — HIGH SENSITIVITY CRP: CRP, High Sensitivity: 111.6 mg/L — ABNORMAL HIGH

## 2011-02-05 LAB — C3 COMPLEMENT: C3 Complement: 80 mg/dL — ABNORMAL LOW (ref 88–201)

## 2011-02-05 LAB — COMPLEMENT, TOTAL: Compl, Total (CH50): 36 U/mL (ref 31–60)

## 2011-02-05 LAB — IRON AND TIBC: Iron: 17 ug/dL — ABNORMAL LOW (ref 42–135)

## 2011-02-05 LAB — FERRITIN: Ferritin: 186 ng/mL (ref 10–291)

## 2011-02-06 LAB — COMPREHENSIVE METABOLIC PANEL
AST: 22 U/L (ref 0–37)
CO2: 25 mEq/L (ref 19–32)
Calcium: 7.5 mg/dL — ABNORMAL LOW (ref 8.4–10.5)
Creatinine, Ser: 0.53 mg/dL (ref 0.4–1.2)
GFR calc Af Amer: >60 mL/min (ref >60)
GFR calc non Af Amer: >60 mL/min (ref >60)

## 2011-02-06 LAB — FERRITIN: Ferritin: 121 ng/mL (ref 10–291)

## 2011-02-06 LAB — POCT HEMOGLOBIN-HEMACUE
Hemoglobin: 10.5 g/dL — ABNORMAL LOW (ref 12.0–15.0)
Hemoglobin: 8.9 g/dL — ABNORMAL LOW (ref 12.0–15.0)
Hemoglobin: 9.3 g/dL — ABNORMAL LOW (ref 12.0–15.0)

## 2011-02-06 LAB — IRON AND TIBC
Iron: 16 ug/dL — ABNORMAL LOW (ref 42–135)
TIBC: 109 ug/dL — ABNORMAL LOW (ref 250–470)

## 2011-02-06 LAB — CK TOTAL AND CKMB (NOT AT ARMC)
Relative Index: 1.7 (ref 0.0–2.5)
Total CK: 104 U/L (ref 7–177)

## 2011-02-07 LAB — IRON AND TIBC
Iron: 17 ug/dL — ABNORMAL LOW (ref 42–135)
TIBC: 111 ug/dL — ABNORMAL LOW (ref 250–470)

## 2011-02-07 LAB — POCT HEMOGLOBIN-HEMACUE
Hemoglobin: 10.8 g/dL — ABNORMAL LOW (ref 12.0–15.0)
Hemoglobin: 8.8 g/dL — ABNORMAL LOW (ref 12.0–15.0)

## 2011-02-08 LAB — IRON AND TIBC
Iron: 14 ug/dL — ABNORMAL LOW (ref 42–135)
Saturation Ratios: 12 % — ABNORMAL LOW (ref 20–55)
TIBC: 102 ug/dL — ABNORMAL LOW (ref 250–470)
UIBC: 99 ug/dL
UIBC: 83 ug/dL

## 2011-02-08 LAB — POCT HEMOGLOBIN-HEMACUE
Hemoglobin: 9.8 g/dL — ABNORMAL LOW (ref 12.0–15.0)
Hemoglobin: 9.6 g/dL — ABNORMAL LOW (ref 12.0–15.0)

## 2011-02-09 LAB — POCT HEMOGLOBIN-HEMACUE
Hemoglobin: 10.1 g/dL — ABNORMAL LOW (ref 12.0–15.0)
Hemoglobin: 10.2 g/dL — ABNORMAL LOW (ref 12.0–15.0)
Hemoglobin: 9.9 g/dL — ABNORMAL LOW (ref 12.0–15.0)

## 2011-02-09 LAB — IRON AND TIBC
Saturation Ratios: 17 % — ABNORMAL LOW (ref 20–55)
TIBC: 140 ug/dL — ABNORMAL LOW (ref 250–470)
TIBC: 103 ug/dL — ABNORMAL LOW (ref 250–470)
UIBC: 85 ug/dL

## 2011-02-09 LAB — FERRITIN: Ferritin: 242 ng/mL (ref 10–291)

## 2011-02-10 LAB — COMPREHENSIVE METABOLIC PANEL
ALT: 13 U/L (ref 0–35)
Alkaline Phosphatase: 61 U/L (ref 39–117)
BUN: 9 mg/dL (ref 6–23)
CO2: 25 mEq/L (ref 19–32)
Chloride: 113 mEq/L — ABNORMAL HIGH (ref 96–112)
GFR calc non Af Amer: >60 mL/min (ref >60)
Glucose, Bld: 153 mg/dL — ABNORMAL HIGH (ref 70–99)
Potassium: 2.9 mEq/L — ABNORMAL LOW (ref 3.5–5.1)
Sodium: 143 mEq/L (ref 135–145)
Total Bilirubin: 0.5 mg/dL (ref 0.3–1.2)

## 2011-02-10 LAB — LIPID PANEL
Cholesterol: 207 mg/dL — ABNORMAL HIGH (ref 0–200)
LDL Cholesterol: 146 mg/dL — ABNORMAL HIGH (ref 0–99)
Total CHOL/HDL Ratio: 8.6 RATIO

## 2011-02-18 ENCOUNTER — Other Ambulatory Visit: Payer: Self-pay | Admitting: Nephrology

## 2011-02-18 ENCOUNTER — Encounter (HOSPITAL_COMMUNITY): Payer: PRIVATE HEALTH INSURANCE

## 2011-02-18 NOTE — H&P (Signed)
[name] [name], [name] [name]                ACCOUNT NO.:  [id number]  MEDICAL RECORD NO.:  [id number]           PATIENT TYPE:  I  LOCATION:  3315                         FACILITY:  [facility]  PHYSICIAN:  Pearlean Brownie, M.D.DATE OF BIRTH:  [birth year]  DATE OF ADMISSION:  01/04/2011 DATE OF DISCHARGE:                             HISTORY & PHYSICAL   PRIMARY CARE PHYSICIAN:  [facility], Dr. Perrin Maltese.  CHIEF COMPLAINT:  Shortness of breath.  HISTORY OF PRESENT ILLNESS:  The patient is a 50 year old female with a history of hypertension, diabetes, and lupus, admitted to the hospital for an episode of shortness of breath today.  The patient states she was gambling when she took a break to go to the bathroom.  When she got back to her machine, she suddenly got short of breath.  She went outside to get some air and became more short of breath, called 911.  Per ED report, EMS found the patient with sats in 70% on room air.  Placed on BiPAP.  On arrival to ER, patient still in acute respiratory distress, systolic blood pressure in the 200s, diastolics in the 100s.  Placed on nitroglycerin drip and given Lasix, and symptoms improved.  The patient denies diagnosis of congestive heart failure.  Has had swelling off and on in lower extremities, left greater than right, times years. Sometimes has swelling of entire legs and into abdomen.  No fever, no chest pain.  No nausea, vomiting, diarrhea.  No diaphoresis.  Occasional blurry vision.  ALLERGIES:  No known drug allergies.  MEDICATIONS: 1. Labetalol 200 mg p.o. b.i.d. 2. KCl 20 mEq p.o. b.i.d. 3. Multivitamin. 4. Lisinopril 20 mg p.o. b.i.d. 5. Furosemide 20 mg p.o. b.i.d. 6. CellCept 250 mg p.o. 4 capsules b.i.d. 7. Amlodipine 10 mg p.o. daily.  PAST MEDICAL HISTORY: 1. Hypertension. 2. Diabetes. 3. Lupus. 4. Rheumatoid arthritis. 5. Anemia.  PAST SURGICAL HISTORY:  Left breast biopsy.  SOCIAL HISTORY:  Lives with 3 children.   Works as a Sales promotion account executive. Smokes 6 cigarettes per day. Two drinks of alcohol per year.  Denies any drug use.  FAMILY HISTORY:  Mother, diabetes, hypertension.  Father, alcohol abuse, MI.  Siblings, healthy.  REVIEW OF SYSTEMS:  Negative for fevers.  No chills.  No appetite changes.  No fatigue.  No weight changes.  No headache.  No throat pain. No chest pain, no orthopnea, no cough, no nausea, no vomiting, no diarrhea.  No dysuria, hematuria.  No rash.  No easy bruising.  No polyuria.  Positive review of systems for edema, occasional wheezing. Positive feeling of fluid and edema in stomach for 2 days.  Positive ankle swelling off and on.  Positive myalgias and arthralgias that patient blames on rheumatoid arthritis.  The patient endorses visual changes, blurry vision x2-3 weeks in left eye greater than right, and positive polydipsia.  PHYSICAL EXAMINATION:  VITAL SIGNS:  Temperature 36.9, pulse 108, respiratory rate 19, blood pressure 181/99, pulse oximetry 98% on 2 liters. GENERAL:  No acute distress, resting quietly in bed with O2 per nasal cannula. HEENT:  No throat erythema.  Poor dentition.  Funduscopic exam within normal limits. NECK:  No lymphadenopathy. CARDIOVASCULAR:  Regular rate and rhythm.  Positive systolic murmur.  No rubs or gallops. LUNGS:  Clear to auscultation bilateral in upper lobes.  Diminish with scattered crackles in bases. ABDOMEN:  Soft, nontender, nondistended.  Positive bowel sounds. BACK:  No pain with palpation. EXTREMITIES:  2+ edema in left lower extremity, 1+ edema in right lower extremity.  Edema in both extremities up to knee level. NEUROLOGIC:  Alert and orient x3, moving all extremities.  Following all commands.  Strength equal grossly bilateral.  Extraocular movements intact.  LABS AND STUDIES:  BNP 1943.  White blood cells 11, hemoglobin 12.4, platelets 331.  Sodium 139, potassium 2.4, chloride 108, creatinine 0.76.  UA shows  moderate hemoglobin, negative nitrite, negative leukocyte esterase.  D-dimer elevated at 8.4.  Point-of-care cardiac enzymes and first set of cardiac enzymes within normal limits.  Chest x- ray shows diffuse bilateral airspace disease with left effusion, most likely secondary to heart failure and edema, underlying pneumonia cannot be excluded.  The patient is a 50 year old woman with hypertension and diabetes, admitted for shortness of breath.  ASSESSMENT/PLAN: 1. Shortness of breath, most likely secondary to congestive heart     failure exacerbation.  The patient denies history of this.  BNP     1943, left lower extremity edema bilateral significant, chest x-ray     suggestive of heart failure.  We will discontinue nitro drip now if     patient's symptoms improved.  We will continue Lasix 40 mg t.i.d.     x1 day.  We will monitor I's and O's and weight daily.  We will     obtain cardiac echocardiogram to further evaluate function.  We     will follow cardiac enzymes to rule out cardiac event, repeat EKG     in morning. 2. Hypertension.  Patient did not take blood pressure med today.  We     will restart beta-blocker, Lopressor.  We will monitor closely and     restart all home meds when stable. 3. Diabetes.  We will monitor CBG closely.  We will cover with sliding     scale insulin. 4. Lupus.  Patient states she takes CellCept for this reason.  We will     continue home dose. 5. Positive systolic murmur.  The patient not aware of history.  We     will evaluate murmur with cardiac echocardiogram that we are     obtaining in order to evaluate heart function. 6. Elevated D-dimer at 8.4 on arrival to the ER.  Shortness of breath     now resolved with treatment of congestive heart failure.  We will     order lower extremity Doppler since D-dimer is high, sudden onset     of symptoms and edema, greater in left than right, although this     pattern of edema has been present times year for  patient.  Since     symptoms improved, no urgent workup needed. 7. Decreased potassium 2.4, repleted.  We will follow up with     potassium in the morning. 8. Prophylaxis.  Heparin 5000 units t.i.d. 9. FEN/GI.  Saline lock IV, heart-healthy diet. 10.Disposition pending clinical improvement.     Ellin Mayhew, MD   ______________________________ Pearlean Brownie, M.D.    DC/MEDQ  D:  01/05/2011  T:  01/05/2011  Job:  [id number]  Electronically Signed by Ellin Mayhew  on 02/18/2011 02:17:25 PM Electronically Signed by  Pearlean Brownie M.D. on 02/18/2011 02:44:15 PM

## 2011-02-19 LAB — POCT HEMOGLOBIN-HEMACUE: Hemoglobin: 7.6 g/dL — ABNORMAL LOW (ref 12.0–15.0)

## 2011-02-21 ENCOUNTER — Encounter (HOSPITAL_COMMUNITY): Payer: PRIVATE HEALTH INSURANCE

## 2011-03-07 ENCOUNTER — Encounter (HOSPITAL_COMMUNITY)
Admission: RE | Admit: 2011-03-07 | Discharge: 2011-03-07 | Disposition: A | Discharge status: Home / Self Care | Payer: PRIVATE HEALTH INSURANCE | Source: Ambulatory Visit | Attending: Nephrology | Admitting: Nephrology

## 2011-03-07 DIAGNOSIS — E785 Hyperlipidemia, unspecified: Secondary | ICD-10-CM | POA: Insufficient documentation

## 2011-03-07 DIAGNOSIS — D638 Anemia in other chronic diseases classified elsewhere: Secondary | ICD-10-CM | POA: Insufficient documentation

## 2011-03-07 DIAGNOSIS — N183 Chronic kidney disease, stage 3 unspecified: Secondary | ICD-10-CM | POA: Insufficient documentation

## 2011-03-18 NOTE — H&P (Signed)
[name], [name]                ACCOUNT NO.:  [id number]   MEDICAL RECORD NO.:  [id number]          PATIENT TYPE:  INP   LOCATION:  6741                         FACILITY:  [facility]   PHYSICIAN:  [name] Ramp, MD        DATE OF BIRTH:  July 10, 1961   DATE OF ADMISSION:  10/22/2008  DATE OF DISCHARGE:                              HISTORY & PHYSICAL   CHIEF COMPLAINT:  Swelling.   PRIMARY CARE PHYSICIAN:  [name] [name], M.D.   NEPHROLOGIST:  [name] [name], M.D.   HISTORY OF PRESENT ILLNESS:  This is a 50 year old female with history  of SLE with membranous glomerulonephropathy who presents with a 1-week  history of progressive generalized edema and malaise.  The patient also  endorses feeling more tired than usual.  Previously she had a similar  episode years ago and was treated as outpatient with Methotrexate and  prednisone taper.  The patient states that the prednisone was stopped  and Methotrexate was recently stopped secondary to the possible rash  reaction.  The patient denies pain anywhere.  The patient denies any  change in urination.  She does not know an inciting factor for this  flare.  The patient reports compliance with medicines.  She recently had  to reschedule her appointment with Dr. Hyman Hopes, her nephrologist.  The  patient also endorses using OE often.  She came to [facility]  today to be evaluated given her increased swelling and more tiredness  and malaise than usual where she was found to have hemoglobin of 8.6 and  a urinalysis showing greater than 300 protein, small amounts of blood,  and RBC casts per micro from [place].   REVIEW OF SYSTEMS:  Positive for knee arthralgias that are starting a  tooth abscess for three days now in her left lower gum line/cheeks.  There is also a new rash that comes and goes per patient on her palms.  The patient denies fevers, chills, nausea, vomiting, diarrhea,  constipation, myalgias, or changes in weight.  Otherwise as in  HPI.   PAST MEDICAL HISTORY:  1. 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  2. Type 2 diabetes diagnosed two years ago and not on any medications.      Stopped six months ago.  3. Hypertension.  4. Rheumatoid arthritis.  5. Iron deficiency anemia.   ALLERGIES:  No known drug allergies per patient, although she does have  in her chart allergies to SULFA and HYDROCODONE.   MEDICATIONS:  1. Lasix 20 mg p.o. b.i.d.  2. Micardis/hydrochlorothiazide 80/12.5 mg daily.  3. Gabapentin 300 mg as needed.  4. Multivitamin per Dr. Hyman Hopes.  5. Aleve daily to b.i.d. a couple times a week.   FAMILY HISTORY:  Diabetes, hypertension, and grandmother with heart  disease into her 45s and an aunt with heart attack and stroke in her  48s.  No cancers, no kidney problems.   SOCIAL HISTORY:  The patient lives in [place] with her  three  children, 15, 18, and 23.  She is separated from her husband or  divorced.  No pets, no alcohol.  The patient does endorse smoking half a  pack a day, but denies recreational drugs.  The patient works at a [employer].   PHYSICAL EXAMINATION:  VITAL SIGNS:  Temperature 98, heart rate 100,  respiratory rate 18, blood pressure 193-196/91-95, O2 saturation 100% on  room air.  GENERAL:  African American female with evident preorbital edema.  HEENT:  Pupils equally round and reactive to light.  Extraocular  movements intact.  Periorbital edema.  Moist mucous membranes.  Left  cheek and lower gum line with swelling but no fluctuance noted.  NECK:  Positive anterior cervical lymphadenopathy bilaterally.  No  pharyngeal edema or exudates.  CARDIOVASCULAR:  Normal S1/S2, 2/6 systolic ejection murmur heard best  at left upper sternal border.  PULMONARY:  Clear to auscultation bilaterally.  No wheezing.  ABDOMEN:  Soft, nontender, nondistended.  Normal  active bowel sounds.  Obese.  No masses or hepatosplenomegaly noted.  EXTREMITIES:  1-2+ pitting edema in bilateral lower extremities.  No  edema in bilateral upper extremities.  SKIN:  Skin breakdown between breasts on chest and superior gluteal  cleft appeared to be small ulcers.  No jaundice.  There is a blanchable  macular rash on bilateral palms medially.  NEUROLOGIC:  Cranial nerves II-XII grossly intact.  Strength and  sensation intact.   LABORATORY DATA:  Sodium 144, potassium 2.5, on repeat 2.4, chloride  106, bicarb 29, BUN 7, creatinine 0.7, glucose 100.  White blood cell  5.8, hemoglobin 8.9, platelets 294, RDW 16.6, MCV 80.8.  Point of care  enzymes negative x1.  BNP 216.  Urinalysis showing greater than 300  protein, no nitrites, small leukocyte esterase.  Micro showing hyaline  casts and 3-6 white blood cells, and many bacteria, no reds.   Chest x-ray in the ER showed mild cardiomegaly with small bilateral  pleural effusions.   EKG:  Normal sinus rhythm.   At [facility], labs showed white blood cells 6.1, hemoglobin 8.6, platelets  318, MCV 78, RDW 18.9.  Urinalysis showed small blood and greater than  300 protein and positive for RBC gas on micro./   ASSESSMENT/PLAN:  This is a 50 year old female with history of  membranous glomerulonephropathy secondary to systemic lupus, also with  history of hypertension, diabetes, and rheumatoid arthritis, who  presents with a 1-week history of swelling, increased blood pressure,  and hypokalemia, and anemia.  1. Swelling.  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  With RBC casts and blood in the [place]      urinalysis, there was concern for concomitant proliferative      glomerulonephritis; however, repeat urinalysis here does not show      the same.  Will  start steroid and increase her Lasix to 40 b.i.d.      to help with her swelling.  The patient reports compliance with      medications  but noncompliance could then explain recent progressive      worsening along with increased blood pressure.  Also could be      explained by questionable new tooth infection.  Consult renal in am      for formal consult.  Per renal, patient with history of      noncompliance.  Encouraged patient to seek dental care.  2. Decreased hemoglobin.  A 9.5  hemoglobin in 2008, today 8.9.      Patient with history of iron deficiency anemia from a GI note in      the chart.  We will check an iron panel and ferritin and start her      on iron supplementation.  Her MCV today was 80, so borderline low,      and elevated RDW.  3. Hypertension.  Restart Micardis/HCTZ and hydralazine p.r.n.  This      could explain the worsening proteinuria.  4. Systemic lupus erythematosus.  The patient is off medications.      Monitor and question to restart prednisone.  Will discuss with      renal while we await renal biopsy.  5. Rheumatoid arthritis.  The patient is off Methotrexate but states      she is willing to retry.  She is followed by outpatient      rheumatology.  6. Type 2 diabetes.  Monitor.  Cover with sliding scale insulin.  7. FEN/GI.  Regular diet.  Block IV.  The patient does have      hypokalemia.  We will replete with 40 mEq p.o. x2  Will check a      magnesium, however, this hypokalemia could possibly be related to      her Lasix dose.  Consider supplementation when discharged.  8. Prophylaxis.  Lovenox as increased coagulable state with nephrosis.   DISPOSITION:  24-hour observation, renal consult in the morning.      [name] Boyden, MD  Electronically Signed      [name] Ramp, MD  Electronically Signed    JG/MEDQ  D:  10/22/2008  T:  10/23/2008  Job:  [id number]   cc:   [name] [name], M.D.

## 2011-03-18 NOTE — Assessment & Plan Note (Signed)
[name] [name]                         GASTROENTEROLOGY OFFICE NOTE   NAME:[name], [name] [name]                       MRN:          [id number]  DATE:05/14/2007                            DOB:          May 11, 1961    NEPHROLOGIST:  Garnetta Buddy, M.D.   GASTROINTESTINAL PROBLEM LIST:  1. Iron-deficiency anemia.  Colonoscopy, June 2007, by me that was      essentially normal, except for internal hemorrhoid.  No tubes or      adenomas.  There was a small hyperplastic polyp that was removed.      Her next routine colonoscopy would not be until 2017.  EGD, June      2007, by me.  It was normal.  Biopsies taken of duodenum to check      for underlying sprue were also normal.  The patient was sent home      with 2 sets of FOBT cards but she did not return them.   INTERVAL HISTORY:  I last saw [name] [name] in June 2007.  She was referred by  [facility] [place] for a workup for iron-deficiency anemia.  That  workup was summarized above.  The last contact was we gave her 2 sets of  hemoccult cards to return for review and the plan was if they were  positive to proceed with small bowel capsule endoscopy.  She did not  return those cards.  She was re-referred back here in April 2008, but  she no-showed for that appointment.  She is now showing up today.  She  does not see any overt GI bleeding.  She does have a menstrual cycle  with monthly periods that she has described as somewhat heavy.   CURRENT MEDICATIONS:  Metformin, prednisone, methotrexate, gabapentin,  acyclovir, Lasix, folate, and amlodipine.   PHYSICAL EXAMINATION:  VITAL SIGNS:  Weight 156 pounds, blood pressure  122/82, pulse 80.  CONSTITUTIONAL:  Generally well-appearing.  ABDOMEN:  Soft, nontender, nondistended.  Normal bowel sounds.  EXTREMITIES:  No lower extremity edema.   ASSESSMENT/PLAN:  A 50 year old woman with iron-deficiency anemia,  status post June 2007, colonoscopy and upper endoscopy that  were both  essentially negative.  The patient did not follow up with  recommendations (FOBT cards not sent back in).  It is not clear that her  iron-deficiency is from a gastrointestinal source given the workup  summarized above.  We have given her 2 sets of FOBT cards again and if  any of these are positive, then I will arrange for her to have a small  bowel capsule study performed.  She does have rather heavy periods on a  monthly basis and perhaps this is where she is loosing her iron.    Rachael Fee, MD  Electronically Signed   DPJ/MedQ  DD: 05/14/2007  DT: 05/15/2007  Job #: [id number]   cc:   Garnetta Buddy, M.D.

## 2011-03-21 NOTE — Discharge Summary (Signed)
NAMESANAIYA, [name] [name]                ACCOUNT NO.:  [id number]   MEDICAL RECORD NO.:  [id number]          PATIENT TYPE:  INP   LOCATION:  6741                         FACILITY:  [facility]   PHYSICIAN:  [name] [name], M.D.DATE OF BIRTH:  Dec 01, 1960   DATE OF ADMISSION:  10/22/2008  DATE OF DISCHARGE:  10/24/2008                               DISCHARGE SUMMARY   ATTENDING PHYSICIAN:  [name] [name], Family Practice Teaching  Service.   PRIMARY CARE [name] [name]:  [name] Braun L. Hal Hope, [name], [facility].   DISCHARGE DIAGNOSES:  1. Systemic lupus erythematosus nephropathy.  2. Hypertension.  3. Hypokalemia.  4. Diabetes mellitus.  5. Left bottom molar tooth abscess.  6. Bilateral superficial breast abscess.  7. Chronic gluteal cleft ulcerations.  8. Iron deficiency anemia.  9. Rheumatoid arthritis.  10.Dyslipidemia.   DISCHARGE MEDICATIONS:  1. Lasix 40 mg p.o. b.i.d.  2. Iron sulfate 325 mg 1 p.o. t.i.d.  3. Doxycycline 100 mg p.o. daily x14 days.  4. Simvastatin 80 mg p.o. nightly.  5. Prednisone 50 mg p.o. daily until the patient is seen by primary      nephrologist.  6. Potassium chloride 40 mEq p.o. daily.  7. Myocardial 80/25 mg p.o. daily.  8. Gabapentin 300 mg p.o. p.r.n.  9. Multivitamin daily per [name] [name].  10.Probiotics daily as directed while taking antibiotics.   DISCONTINUED MEDICATIONS:  Micardis 80/12.5 mg p.o. daily.   CONSULT:  Primary nephrologist was unofficially consulted.   PROCEDURES:  None.   IMAGING:  Chest x-ray on October 22, 2008.  Impression:  Mild  cardiomegaly with small bilateral pleural effusion.   DISCHARGE LABORATORY DATA:  A 16-hour urine collection pending.  Urine  culture, no growth.  Magnesium 1.7.  CMET, sodium 148, potassium 3.3,  chloride 109, CO2 of 27, glucose 168, BUN 6, and creatinine 0.58.  LFTs  within normal limits.  Total protein 4.5.  Albumin less than 1.  Calcium  7.7.  Hemoglobin 9.1, hematocrit 28.8, white count  5.2, and platelets  262.  ESR 103.  Ferritin 59 and iron less than 10.  Lipid profile, total  cholesterol 257, triglycerides 167, HDL 15, and LDL 209.  Repeat UA on  October 23, 2008, within normal limits.   BRIEF HOSPITAL COURSE:  A 50 year old female with history of systemic  lupus erythematosus with membranous glomerular nephropathy presented  with a 1-week history of progressive generalized edema and malaise to  primary care [name] [name].  Urinalysis at [facility] showed greater  than 300 protein with small amount of blood and rbc cast, concern for  progressive glomerular nephritis.  The patient was admitted with  increased edema, hypertension, hypokalemia, and concern for worsening  lupus nephritis.  1. Systemic lupus erythematosus nephropathy.  The patient with history      of membranous nephropathy.  Primary nephrologist was consulted and      thought deterioration, although stable to be secondary to some      noncompliance with diuretics and steroids per the patient.  Lasix      was increased to 40 mg p.o.  b.i.d. from the patient's home dose of      20 mg p.o. b.i.d.  The patient diuresed well.  Nephrology decided      the patient will follow up outpatient for repeat biopsy.  The      patient was started on prednisone 50 mg p.o. daily to be taken      until biopsy was done by Renal outpatient.  Lower extremity edema      as well as edema located in phase bilaterally improved prior to      discharge.  Initially, primary team started 24-hour urine which was      cut off at 16 hours as the patient will follow up with the Renal as      outpatient and repeat labs will be done under their supervision.  A      24-hour urine at that time would not change clinical course.  There      was a repeat urinalysis as initially the patient was found to have      red blood cell casts on UA at primary care [name] [name]'s office.  In-      and-out cath was done and did not show any evidence of rbc  cast.      Urine culture was also normal.  Antibiotics were not needed as      there was no evidence for urinary tract infection.  2. Hypertension.  The patient is found to have blood pressure elevated      upon admission.  Blood pressure ranged from 150-180 systolic over      70s to upper 90s diastolic.  The patient was initially treated with      home dose of Micardis/HCTZ, however, Benicar was substituted for      Micardis as this is the hospital formulary.  HCTZ was increased to      25 mg p.o. daily during admission.  Blood pressures did improve,      however, optimal blood pressures for this patient will be less than      125/75 with history of advanced renal disease.  Upon discharge, the      patient will continue with Micardis 80/25 HCTZ p.o. daily.  3. Hypokalemia.  The patient is found to be hypokalemic upon      discharge.  Initially, potassium was repleted with both p.o. and      IV.  Potassium was 3.3 at discharge, however, the patient did      receive p.o. repletions.  After that, potassium level was drawn.      Magnesium was within normal limits.  The patient will continue with      potassium supplementation daily as the patient is on large doses of      diuretics and BP meds.  4. Diabetes mellitus.  The patient with history of diabetes mellitus,      however, was diet controlled upon admission.  Sliding scale insulin      was used as the patient was receiving high doses of      corticosteroids.  Upon discharge, the patient will continue diet      control for diabetes mellitus and follow up with PCP.  5. Tooth abscess.  The patient is found to have tooth abscess upon      admission and will need followup with dentist.  Initially,      Augmentin was started, however, the patient was also found to have      bilateral superficial breast  abscess, therefore doxycycline was      used instead of Augmentin to cover for both tooth abscess and      bilateral breast infections.  6.  Bilateral breast abscess.  The patient gave a subjective history of      bilateral breast abscess which started a couple of days prior to      admission, with increasing swelling in lower extremities and upper      extremities.  Abscesses were superficial in nature with some white      discharge.  Doxycycline was started and the patient is to use      cornstarch powder beneath the breasts to keep dry.  Wound Care was      consulted and recommended dermatology rereferral if abscesses did      not clear.  Of note, the patient has a history of many abscesses      and superficial ulcerations, which have been present for years and      have been intermittent.  Per the patient, it is unclear regarding      etiology of recurrent abscess/ulcerations.  7. Chronic gluteal cleft abscess/ulcerations.  The patient with      chronic gluteal cleft ulcerations.  Wound Care was consulted,      however, the patient had previously seen dermatologist on multiple      occasions for these ulcerations.  There was no new treatment.      Barrier cream and bandaging was given.  8. Iron deficiency anemia.  The patient is found to be iron deficient      during admission.  After review of records, she appeared to have      chronic history of iron deficiency anemia.  Iron was started at 325      mg t.i.d., both total iron and ferritin were low.  9. Dyslipidemia.  Fasting lipid panel showed increased total      cholesterol as well as triglycerides and LDL.  The patient was      started on simvastatin 80 mg nightly especially with history of      progressive renal disease, hypertension, and diabetes.  10.Rheumatoid arthritis.  The patient with history of rheumatoid      arthritis.  Per the patient, prednisone and methotrexate have been      previously discontinued.  She will follow up with primary      rheumatologist upon discharge.  Will use Aleve as needed.   DISCHARGE INSTRUCTIONS:  Applied clean gauze beneath the  breasts  bilaterally.  Keep the area dry and clean.  Follow up with primary  rheumatologist and nephrologist as scheduled.  Follow up with primary  care Hajer Dwyer as scheduled.  Heart-healthy diet.   FOLLOWUP APPOINTMENTS:  1. Garnetta Buddy, [name], primary Nephrology, on November 07, 2008, at 1:45      p.m.  2. Marcos Eke. Hal [name], primary care Taressa Rauh, on November 10, 2008, at      1:15 p.m.   DISCHARGE CONDITION:  Stable.   DISCHARGE LOCATION:  Home.      Milinda Antis, [name]  Electronically Signed      [name] [name], M.D.  Electronically Signed    KD/MEDQ  D:  10/28/2008  T:  10/29/2008  Job:  [id number]   cc:   [name] [name], M.D.  Garnetta Buddy, M.D.

## 2011-03-21 NOTE — Op Note (Signed)
[name], [name]                ACCOUNT NO.:  [id number]   MEDICAL RECORD NO.:  [id number]          PATIENT TYPE:  OIB   LOCATION:  5127                         FACILITY:  [facility]   PHYSICIAN:  Garnetta Buddy, M.D.   DATE OF BIRTH:  [birth year]   DATE OF PROCEDURE:  07/08/2006  DATE OF DISCHARGE:                                 OPERATIVE REPORT   SURGEON:  Garnetta Buddy, M.D.   PROCEDURE:  Renal biopsy.   REASON FOR RENAL BIOPSY:  Nephrotic-range proteinuria.   DESCRIPTION OF PROCEDURE:  The patient was taken to the procedure ultrasound  room and was prepped and draped in the usual sterile fashion.  The  ultrasounds of her left kidney and right kidney were obtained.  Her left  kidney appeared to be within 5.3 cm of the surface.  The area was demarcated  with a nonsharp pen and 15 cc of 1% lidocaine was infiltrated into the area  in order to obtain anesthesia.  Using a biopsy gun, this was introduced  through a guide, and followed directly into the capsule of the kidney.  Three biopsies were obtained sequentially using a similar technique.  One  core of kidney material was obtained and placed into a saline solution.  The  review of the ultrasound after the biopsy revealed no evidence of any trauma  or bleeding.  The patient tolerated the procedure well and will be kept over  24 hours for observation with serial hemoglobins.      Garnetta Buddy, M.D.  Electronically Signed     MWW/MEDQ  D:  07/08/2006  T:  07/08/2006  Job:  [id number]

## 2011-03-21 NOTE — H&P (Signed)
[name] [name], [name] [name]                ACCOUNT NO.:  [id number]   MEDICAL RECORD NO.:  [id number]          PATIENT TYPE:  OIB   LOCATION:  2857                         FACILITY:  [facility]   PHYSICIAN:  Garnetta Buddy, M.D.   DATE OF BIRTH:  June 22, 1961   DATE OF ADMISSION:  07/08/2006  DATE OF DISCHARGE:                                HISTORY & PHYSICAL   Renal biopsy July 08, 2006, 3 p.m.   HISTORY OF PRESENT ILLNESS:  This is a 50 year old African-American female  with history of proteinuria, 3.7 g.  She has a known history of systemic  lupus erythematosus and rheumatoid arthritis and being followed by  rheumatology with renal function being relatively stable.   PAST MEDICAL HISTORY:  1. History of mixed connective tissue disorder.  2. History of hypertension.  3. History of hyperlipidemia.  4. Diabetes mellitus type 2.  5. History of tubal ligation.  6. History of menorrhagia.   MEDICATIONS:  1. Methotrexate 10 mg weekly.  2. 10 mg weekly.  3. Amlodipine 5 mg daily.  4. Lasix 20 mg b.i.d.  5. Zestoretic 20/25 mg daily.  6. Lipitor 20 mg daily.  7. Metformin 500 mg daily.   ALLERGIES:  SULFA.   REVIEW OF SYSTEMS:  GENERAL:  Denies fatigue, fever, sweats or chills.  EYES:  Denies visual complaints, blurred vision, double vision, eye pain.  EARS, NOSE, MOUTH, THROAT:  No hearing loss, sore throat, no epistaxis.  No  mouth or gum lesions.  CARDIOVASCULAR:  No angina, orthopnea, syncope.  Admits to lower extremity swelling.  RESPIRATORY:  Admits to occasional  cough.  Smokes 1 pack of cigarettes per day.  Denies any wheezing.  Denies  any use of inhaler or any abnormality on chest x-ray.  ABDOMEN:  Denies  weight change, nausea or vomiting or diarrhea.  She has a history of a  colonoscopy and EGD 1-2 months ago by Dr. Christella Hartigan for iron-deficiency anemia.  UROGENITAL:  Denies urgency, frequency, dysuria.  She has passed a lot of  blood per vagina with heavy periods.  No history  of renal calculi.  MUSCULOSKELETAL:  She has a diagnosis of mixed connective tissue disorder  and possibly systemic lupus erythematosus and rheumatoid arthritis, followed  by Dr. Jimmy Footman.  She denies any use of nonsteroidal anti-inflammatory  drugs and denies any present joint pain or swelling but does complain of  back pain and knee pain and wrist pain.  She does not have any history of  gout.  NEUROLOGIC:  No strokes, seizures.  Admits to paresthesias over the  lower extremities.  She denies any weakness.  HEMATOLOGIC/ONCOLOGIC:  No  history of DVT or pulmonary embolus.  No history of cancer.   SOCIAL HISTORY:  She is married, 3 children.  A one pack per day smoker.  Works in a factory.  High school education.   FAMILY HISTORY:  Negative for end-stage renal disease.  Positive for  diabetes.  Father died at 57 of heart failure.  His name was Dorothey Baseman.  Her mother has poor health.  Her name is Pattricia Boss  Corpening.   PHYSICAL EXAMINATION:  GENERAL:  Alert, very pleasant lady in no obvious  distress.  VITAL SIGNS:  Blood pressure 130/80, pulse of 100, temperature afebrile,  weight 172.  HEENT:  Head and eyes normocephalic, atraumatic, pupils are round, equally  reactive.  Funduscopic evaluation benign.  Ears, nose, mouth, throat:  The  __________ is normal, nasal mucosa clear.  Oropharynx is clear.  NECK:  Supple, no thyromegaly or adenopathy.  No JVD, no carotid bruits.  CARDIOVASCULAR:  No heaves, thrills, rubs, gallops.  Regular rate and  rhythm.  BREASTS:  Normal.  LUNGS:  Clear to auscultation.  No wheezes or rales to percussion, not  resonant throughout.  ABDOMEN:  Soft, nontender.  No hepatosplenomegaly.  Bowel sounds present.  No pulsatile abdominal masses.  No abdominal bruits.  Peripheral pulses 2+  radial, femoral, carotid, dorsalis pedis, posterior tibial, without bruits.  NEUROLOGIC:  Cranial nerves II-XII were intact.  Reflexes symmetric.  There  is no loss of sensation to  percussion or vibration, upper or lower  extremities.  She had noted edema on the lower extremities to the mid-shin,  1+ pitting.   Most recent labs pending.  Most recent creatinine 0.7.  Most recent  hemoglobin of 9.5.   ASSESSMENT AND PLAN:  Nephrotic range proteinuria.  Differential:  Mixed  connective tissue disorder, systemic lupus erythematosus, diabetes mellitus.  Will await the results of the renal biopsy and I will establish a diagnosis  and management plan.      Garnetta Buddy, M.D.  Electronically Signed     MWW/MEDQ  D:  07/08/2006  T:  07/08/2006  Job:  [id number]

## 2011-03-21 NOTE — Op Note (Signed)
[name]  Patient:    [name] [name], [name] [name]                       MRN: [id number] Proc. Date: 05/18/00 Adm. Date:  [id number] Attending:  Sonda Primes CC:         Mardene Celeste. Lurene Shadow, M.D. (2)                           Operative Report  PREOPERATIVE DIAGNOSIS:  Right breast mass.  POSTOPERATIVE DIAGNOSIS:  Right breast mass.  Pathology pending.  OPERATION PERFORMED:  Excisional biopsy of right breast mass.  SURGEON:  Mardene Celeste. Lurene Shadow, M.D.  ASSISTANT:  Nurse.  ANESTHESIA:  General.  INDICATIONS FOR PROCEDURE:  The patient is a 50 year old woman presenting with a subareolar right-sided breast mass with some nipple retraction.  The mass appears to be generally smooth-walled.  She is brought to the operating room now for excisional biopsy of this mass to rule out carcinoma.  DESCRIPTION OF PROCEDURE:  Following the induction of anesthesia, the patient was positioned supinely and the right breast prepped and draped to be included in a sterile operative field.  A circumareolar incision was made at the superior medial border of the nipple, deepened through the skin and subcutaneous tissues and dissection carried up to the nipple area.  The mass was dissected free from the overlying nipple tissues.  There was some milky white substance coming from within the mass consistent with an obstructed duct.  The mass was excised in its entirety, removed and forwarded for pathologic evaluation. Hemostasis was assured with electrocautery.  Sponge, instrument and sharp counts were verified.  Wound closed in layers as follows. Subcutaneous tissues closed with interrupted 3-0 Vicryl suture and the skin closed with a 5-0 Monocryl and reinforced with Steri-Strips.  Sterile compressive dressing was applied.  Anesthetic reversed.  Patient removed from the operating room to the recovery room in stable condition having tolerated the procedure well. DD:   05/18/00 TD:  05/18/00 Job: [id number]

## 2011-04-04 ENCOUNTER — Encounter (HOSPITAL_COMMUNITY): Payer: PRIVATE HEALTH INSURANCE

## 2011-04-10 ENCOUNTER — Encounter (HOSPITAL_COMMUNITY): Payer: PRIVATE HEALTH INSURANCE | Attending: Nephrology

## 2011-04-10 ENCOUNTER — Other Ambulatory Visit: Payer: Self-pay | Admitting: Nephrology

## 2011-04-10 DIAGNOSIS — D638 Anemia in other chronic diseases classified elsewhere: Secondary | ICD-10-CM | POA: Insufficient documentation

## 2011-04-10 DIAGNOSIS — E785 Hyperlipidemia, unspecified: Secondary | ICD-10-CM | POA: Insufficient documentation

## 2011-04-10 DIAGNOSIS — N183 Chronic kidney disease, stage 3 unspecified: Secondary | ICD-10-CM | POA: Insufficient documentation

## 2011-04-10 LAB — IRON AND TIBC: TIBC: 180 ug/dL — ABNORMAL LOW (ref 250–470)

## 2011-04-11 LAB — POCT HEMOGLOBIN-HEMACUE: Hemoglobin: 11 g/dL — ABNORMAL LOW (ref 12.0–15.0)

## 2011-04-24 ENCOUNTER — Other Ambulatory Visit: Payer: Self-pay | Admitting: Nephrology

## 2011-04-24 ENCOUNTER — Encounter (HOSPITAL_COMMUNITY): Payer: PRIVATE HEALTH INSURANCE

## 2011-04-25 LAB — IRON AND TIBC
Iron: 45 ug/dL (ref 42–135)
TIBC: 160 ug/dL — ABNORMAL LOW (ref 250–470)
UIBC: 115 ug/dL

## 2011-05-08 ENCOUNTER — Encounter (HOSPITAL_COMMUNITY): Payer: PRIVATE HEALTH INSURANCE

## 2011-06-12 ENCOUNTER — Other Ambulatory Visit: Payer: Self-pay | Admitting: Nephrology

## 2011-06-12 ENCOUNTER — Encounter (HOSPITAL_COMMUNITY)
Admission: RE | Admit: 2011-06-12 | Discharge: 2011-06-12 | Disposition: A | Discharge status: Home / Self Care | Payer: PRIVATE HEALTH INSURANCE | Source: Ambulatory Visit | Attending: Nephrology | Admitting: Nephrology

## 2011-06-12 DIAGNOSIS — D638 Anemia in other chronic diseases classified elsewhere: Secondary | ICD-10-CM | POA: Insufficient documentation

## 2011-06-12 DIAGNOSIS — N183 Chronic kidney disease, stage 3 unspecified: Secondary | ICD-10-CM | POA: Insufficient documentation

## 2011-06-12 DIAGNOSIS — E785 Hyperlipidemia, unspecified: Secondary | ICD-10-CM | POA: Insufficient documentation

## 2011-06-12 LAB — IRON AND TIBC
Iron: 32 ug/dL — ABNORMAL LOW (ref 42–135)
Saturation Ratios: 16 % — ABNORMAL LOW (ref 20–55)
TIBC: 197 ug/dL — ABNORMAL LOW (ref 250–470)
UIBC: 165 ug/dL

## 2011-06-12 LAB — POCT HEMOGLOBIN-HEMACUE: Hemoglobin: 10.7 g/dL — ABNORMAL LOW (ref 12.0–15.0)

## 2011-06-26 ENCOUNTER — Encounter (HOSPITAL_COMMUNITY): Payer: PRIVATE HEALTH INSURANCE

## 2011-06-27 ENCOUNTER — Other Ambulatory Visit: Payer: Self-pay | Admitting: Nephrology

## 2011-06-27 ENCOUNTER — Encounter (HOSPITAL_COMMUNITY): Payer: PRIVATE HEALTH INSURANCE

## 2011-06-27 LAB — POCT HEMOGLOBIN-HEMACUE: Hemoglobin: 10 g/dL — ABNORMAL LOW (ref 12.0–15.0)

## 2011-07-10 ENCOUNTER — Other Ambulatory Visit: Payer: Self-pay | Admitting: Nephrology

## 2011-07-10 ENCOUNTER — Encounter (HOSPITAL_COMMUNITY): Payer: PRIVATE HEALTH INSURANCE | Attending: Nephrology

## 2011-07-10 DIAGNOSIS — E785 Hyperlipidemia, unspecified: Secondary | ICD-10-CM | POA: Insufficient documentation

## 2011-07-10 DIAGNOSIS — D638 Anemia in other chronic diseases classified elsewhere: Secondary | ICD-10-CM | POA: Insufficient documentation

## 2011-07-10 DIAGNOSIS — N183 Chronic kidney disease, stage 3 unspecified: Secondary | ICD-10-CM | POA: Insufficient documentation

## 2011-07-10 LAB — IRON AND TIBC
Iron: 21 ug/dL — ABNORMAL LOW (ref 42–135)
UIBC: 121 ug/dL — ABNORMAL LOW (ref 125–400)

## 2011-07-24 ENCOUNTER — Encounter (HOSPITAL_COMMUNITY): Payer: PRIVATE HEALTH INSURANCE

## 2011-07-25 ENCOUNTER — Other Ambulatory Visit: Payer: Self-pay | Admitting: Nephrology

## 2011-07-25 ENCOUNTER — Encounter (HOSPITAL_COMMUNITY): Payer: PRIVATE HEALTH INSURANCE

## 2011-07-28 LAB — POCT HEMOGLOBIN-HEMACUE: Hemoglobin: 10.9 g/dL — ABNORMAL LOW (ref 12.0–15.0)

## 2011-08-07 LAB — DIFFERENTIAL
Basophils Absolute: 0 10*3/uL (ref 0.0–0.1)
Basophils Relative: 1 % (ref 0–1)
Eosinophils Absolute: 0 10*3/uL (ref 0.0–0.7)
Monocytes Relative: 8 % (ref 3–12)
Neutro Abs: 4.1 10*3/uL (ref 1.7–7.7)
Neutrophils Relative %: 71 % (ref 43–77)

## 2011-08-07 LAB — FERRITIN: Ferritin: 59 ng/mL (ref 10–291)

## 2011-08-07 LAB — RENAL FUNCTION PANEL
BUN: 7 mg/dL (ref 6–23)
Chloride: 110 mEq/L (ref 96–112)
Creatinine, Ser: 0.42 mg/dL (ref 0.4–1.2)
Glucose, Bld: 80 mg/dL (ref 70–99)
Potassium: 2.4 mEq/L — CL (ref 3.5–5.1)

## 2011-08-07 LAB — BASIC METABOLIC PANEL
CO2: 29 mEq/L (ref 19–32)
Calcium: 7.3 mg/dL — ABNORMAL LOW (ref 8.4–10.5)
Chloride: 103 mEq/L (ref 96–112)
GFR calc Af Amer: >60 mL/min (ref >60)
Potassium: 3 mEq/L — ABNORMAL LOW (ref 3.5–5.1)
Sodium: 141 mEq/L (ref 135–145)

## 2011-08-07 LAB — CBC
HCT: 24.1 % — ABNORMAL LOW (ref 36.0–46.0)
MCHC: 31.5 g/dL (ref 30.0–36.0)
MCHC: 32 g/dL (ref 30.0–36.0)
MCHC: 32.7 g/dL (ref 30.0–36.0)
MCV: 79.2 fL (ref 78.0–100.0)
MCV: 80.5 fL (ref 78.0–100.0)
MCV: 80.8 fL (ref 78.0–100.0)
Platelets: 252 10*3/uL (ref 150–400)
Platelets: 262 10*3/uL (ref 150–400)
Platelets: 294 10*3/uL (ref 150–400)
RBC: 3.04 MIL/uL — ABNORMAL LOW (ref 3.87–5.11)
RBC: 3.44 MIL/uL — ABNORMAL LOW (ref 3.87–5.11)
RDW: 16.5 % — ABNORMAL HIGH (ref 11.5–15.5)
RDW: 16.6 % — ABNORMAL HIGH (ref 11.5–15.5)
WBC: 5.2 10*3/uL (ref 4.0–10.5)
WBC: 5.4 10*3/uL (ref 4.0–10.5)

## 2011-08-07 LAB — URINE MICROSCOPIC-ADD ON

## 2011-08-07 LAB — CREATININE CLEARANCE, URINE, 24 HOUR
Creatinine, 24H Ur: 1115 mg/d (ref 700–1800)
Creatinine: 0.35 mg/dL — ABNORMAL LOW (ref 0.40–1.20)

## 2011-08-07 LAB — GLUCOSE, CAPILLARY: Glucose-Capillary: 109 mg/dL — ABNORMAL HIGH (ref 70–99)

## 2011-08-07 LAB — PROTEIN, URINE, 24 HOUR
Collection Interval-UPROT: 16 hours
Urine Total Volume-UPROT: 700 mL

## 2011-08-07 LAB — LIPID PANEL
HDL: 15 mg/dL — ABNORMAL LOW (ref >39)
LDL Cholesterol: 209 mg/dL — ABNORMAL HIGH (ref 0–99)
Total CHOL/HDL Ratio: 17.1 RATIO
Triglycerides: 167 mg/dL — ABNORMAL HIGH (ref <150)
VLDL: 33 mg/dL (ref 0–40)

## 2011-08-07 LAB — POCT I-STAT, CHEM 8
BUN: 7 mg/dL (ref 6–23)
Calcium, Ion: 1.06 mmol/L — ABNORMAL LOW (ref 1.12–1.32)
Hemoglobin: 8.5 g/dL — ABNORMAL LOW (ref 12.0–15.0)
Sodium: 144 mEq/L (ref 135–145)
TCO2: 29 mmol/L (ref 0–100)

## 2011-08-07 LAB — COMPREHENSIVE METABOLIC PANEL
AST: 19 U/L (ref 0–37)
Albumin: <1 g/dL — ABNORMAL LOW (ref 3.5–5.2)
Calcium: 7.7 mg/dL — ABNORMAL LOW (ref 8.4–10.5)
Creatinine, Ser: 0.58 mg/dL (ref 0.4–1.2)
GFR calc Af Amer: >60 mL/min (ref >60)
Total Protein: 4.5 g/dL — ABNORMAL LOW (ref 6.0–8.3)

## 2011-08-07 LAB — POCT CARDIAC MARKERS

## 2011-08-07 LAB — URINALYSIS, ROUTINE W REFLEX MICROSCOPIC
Bilirubin Urine: NEGATIVE
Glucose, UA: NEGATIVE mg/dL
Ketones, ur: NEGATIVE mg/dL
Nitrite: NEGATIVE
Nitrite: NEGATIVE
Specific Gravity, Urine: 1.015 (ref 1.005–1.030)
Specific Gravity, Urine: 1.029 (ref 1.005–1.030)
pH: 7 (ref 5.0–8.0)
pH: 7.5 (ref 5.0–8.0)

## 2011-08-07 LAB — SEDIMENTATION RATE: Sed Rate: 103 mm/hr — ABNORMAL HIGH (ref 0–22)

## 2011-08-07 LAB — URINE CULTURE

## 2011-08-07 LAB — POTASSIUM: Potassium: 2.5 mEq/L — CL (ref 3.5–5.1)

## 2011-08-08 ENCOUNTER — Encounter (HOSPITAL_COMMUNITY)
Admission: RE | Admit: 2011-08-08 | Discharge: 2011-08-08 | Disposition: A | Discharge status: Home / Self Care | Payer: PRIVATE HEALTH INSURANCE | Source: Ambulatory Visit | Attending: Nephrology | Admitting: Nephrology

## 2011-08-08 DIAGNOSIS — D638 Anemia in other chronic diseases classified elsewhere: Secondary | ICD-10-CM | POA: Insufficient documentation

## 2011-08-08 DIAGNOSIS — N183 Chronic kidney disease, stage 3 unspecified: Secondary | ICD-10-CM | POA: Insufficient documentation

## 2011-08-08 DIAGNOSIS — E785 Hyperlipidemia, unspecified: Secondary | ICD-10-CM | POA: Insufficient documentation

## 2011-08-11 ENCOUNTER — Encounter (HOSPITAL_COMMUNITY): Payer: PRIVATE HEALTH INSURANCE

## 2011-08-21 LAB — RENAL FUNCTION PANEL
Albumin: 1.1 — ABNORMAL LOW
Chloride: 107
Creatinine, Ser: 0.36 — ABNORMAL LOW
GFR calc Af Amer: >60
GFR calc non Af Amer: >60
Phosphorus: 4.5
Potassium: 3.1 — ABNORMAL LOW

## 2011-08-21 LAB — IRON AND TIBC: Saturation Ratios: 81 — ABNORMAL HIGH

## 2011-08-21 LAB — HEMOGLOBIN AND HEMATOCRIT, BLOOD: HCT: 29.4 — ABNORMAL LOW

## 2011-10-14 ENCOUNTER — Other Ambulatory Visit (HOSPITAL_COMMUNITY): Payer: Self-pay | Admitting: *Deleted

## 2011-10-15 ENCOUNTER — Encounter (HOSPITAL_COMMUNITY)
Admission: RE | Admit: 2011-10-15 | Discharge: 2011-10-15 | Disposition: A | Discharge status: Home / Self Care | Payer: PRIVATE HEALTH INSURANCE | Source: Ambulatory Visit | Attending: Nephrology | Admitting: Nephrology

## 2011-10-15 DIAGNOSIS — E785 Hyperlipidemia, unspecified: Secondary | ICD-10-CM | POA: Insufficient documentation

## 2011-10-15 DIAGNOSIS — N183 Chronic kidney disease, stage 3 unspecified: Secondary | ICD-10-CM | POA: Insufficient documentation

## 2011-10-15 DIAGNOSIS — D638 Anemia in other chronic diseases classified elsewhere: Secondary | ICD-10-CM | POA: Insufficient documentation

## 2011-10-15 LAB — IRON AND TIBC: Saturation Ratios: 14 % — ABNORMAL LOW (ref 20–55)

## 2011-10-15 MED ORDER — EPOETIN ALFA 20000 UNIT/ML IJ SOLN
INTRAMUSCULAR | Status: AC
  Administered 2011-10-15: 16:00:00 via SUBCUTANEOUS
  Filled 2011-10-15: qty 1

## 2011-10-15 MED ORDER — EPOETIN ALFA 20000 UNIT/ML IJ SOLN: 20000.0000 [IU] | INTRAMUSCULAR | Status: DC

## 2011-10-21 ENCOUNTER — Other Ambulatory Visit (HOSPITAL_COMMUNITY): Payer: Self-pay | Admitting: *Deleted

## 2011-10-24 ENCOUNTER — Inpatient Hospital Stay (HOSPITAL_COMMUNITY)
Admission: RE | Admit: 2011-10-24 | Discharge: 2011-10-24 | Payer: PRIVATE HEALTH INSURANCE | Source: Ambulatory Visit | Attending: Nephrology | Admitting: Nephrology

## 2011-10-24 MED ORDER — FERUMOXYTOL INJECTION 510 MG/17 ML
510.0000 mg | INTRAVENOUS | Status: DC
  Administered 2011-10-24: 510 mg via INTRAVENOUS

## 2011-10-31 ENCOUNTER — Encounter (HOSPITAL_COMMUNITY): Payer: PRIVATE HEALTH INSURANCE

## 2011-10-31 ENCOUNTER — Encounter (HOSPITAL_COMMUNITY)
Admission: RE | Admit: 2011-10-31 | Discharge: 2011-10-31 | Disposition: A | Discharge status: Home / Self Care | Payer: PRIVATE HEALTH INSURANCE | Source: Ambulatory Visit | Attending: Nephrology | Admitting: Nephrology

## 2011-10-31 MED ORDER — FERUMOXYTOL INJECTION 510 MG/17 ML
INTRAVENOUS | Status: AC
  Administered 2011-10-31: 510 mg via INTRAVENOUS
  Filled 2011-10-31: qty 17

## 2011-10-31 MED ORDER — EPOETIN ALFA 20000 UNIT/ML IJ SOLN
INTRAMUSCULAR | Status: AC
  Administered 2011-10-31: 20000 [IU] via SUBCUTANEOUS
  Filled 2011-10-31: qty 1

## 2011-10-31 MED ORDER — FERUMOXYTOL INJECTION 510 MG/17 ML
510.0000 mg | Freq: Once | INTRAVENOUS | Status: AC
  Administered 2011-10-31: 510 mg via INTRAVENOUS

## 2011-10-31 MED ORDER — EPOETIN ALFA 20000 UNIT/ML IJ SOLN
20000.0000 [IU] | INTRAMUSCULAR | Status: AC
  Administered 2011-10-31: 20000 [IU] via SUBCUTANEOUS

## 2011-11-17 ENCOUNTER — Encounter (HOSPITAL_COMMUNITY)
Admission: RE | Admit: 2011-11-17 | Discharge: 2011-11-17 | Disposition: A | Discharge status: Home / Self Care | Payer: PRIVATE HEALTH INSURANCE | Source: Ambulatory Visit | Attending: Nephrology | Admitting: Nephrology

## 2011-11-17 DIAGNOSIS — D638 Anemia in other chronic diseases classified elsewhere: Secondary | ICD-10-CM | POA: Insufficient documentation

## 2011-11-17 DIAGNOSIS — E785 Hyperlipidemia, unspecified: Secondary | ICD-10-CM | POA: Insufficient documentation

## 2011-11-17 DIAGNOSIS — N183 Chronic kidney disease, stage 3 unspecified: Secondary | ICD-10-CM | POA: Insufficient documentation

## 2011-11-17 NOTE — Progress Notes (Signed)
Pt's bp was too high.  Pt did not want to stay and take clonidine, rather she requested to reschedule her appt for this Friday. She stated she took all her meds today and has another BP med to take this evening which I told her to be sure she takes it.  She stated that the Dr. Riley Churches her BP meds around recently and she is taking labetalol now and thinks it hasnt started working yet.  I called [facility] and could not reach the nurse for Dr. Hyman Hopes so I left a voicemail stating all of the above and told them that the patient left but stated she would come back this Friday.  I stated on the message to give [place] or the pt a call if there  Were any further orders regarding this patient.

## 2011-11-21 ENCOUNTER — Encounter (HOSPITAL_COMMUNITY): Payer: PRIVATE HEALTH INSURANCE

## 2011-11-28 ENCOUNTER — Encounter (HOSPITAL_COMMUNITY): Admission: RE | Admit: 2011-11-28 | Payer: PRIVATE HEALTH INSURANCE | Source: Ambulatory Visit

## 2011-12-01 ENCOUNTER — Encounter (HOSPITAL_COMMUNITY)
Admission: RE | Admit: 2011-12-01 | Discharge: 2011-12-01 | Disposition: A | Discharge status: Home / Self Care | Payer: PRIVATE HEALTH INSURANCE | Source: Ambulatory Visit | Attending: Nephrology | Admitting: Nephrology

## 2011-12-01 MED ORDER — CLONIDINE HCL 0.1 MG PO TABS
0.1000 mg | ORAL_TABLET | Freq: Once | ORAL | Status: AC | PRN
  Administered 2011-12-01: 0.1 mg via ORAL

## 2011-12-01 NOTE — Progress Notes (Signed)
Called Dr. Hyman Hopes regarding bp elevation despite giving clonidine to pt per standing order. Orders given to hold shot for today and instruct pt to check her Bp at home this week and if her systolic is >140 to call his office.  Also told to schedule her for next week in short stay.

## 2011-12-04 ENCOUNTER — Other Ambulatory Visit: Payer: Self-pay | Admitting: Family Medicine

## 2011-12-08 ENCOUNTER — Encounter (HOSPITAL_COMMUNITY): Payer: PRIVATE HEALTH INSURANCE

## 2012-01-26 ENCOUNTER — Other Ambulatory Visit (HOSPITAL_COMMUNITY): Payer: Self-pay | Admitting: *Deleted

## 2012-01-27 ENCOUNTER — Encounter (HOSPITAL_COMMUNITY)
Admission: RE | Admit: 2012-01-27 | Discharge: 2012-01-27 | Disposition: A | Discharge status: Home / Self Care | Payer: PRIVATE HEALTH INSURANCE | Source: Ambulatory Visit | Attending: Nephrology | Admitting: Nephrology

## 2012-01-27 DIAGNOSIS — N183 Chronic kidney disease, stage 3 unspecified: Secondary | ICD-10-CM | POA: Insufficient documentation

## 2012-01-27 DIAGNOSIS — D638 Anemia in other chronic diseases classified elsewhere: Secondary | ICD-10-CM | POA: Insufficient documentation

## 2012-01-27 DIAGNOSIS — E785 Hyperlipidemia, unspecified: Secondary | ICD-10-CM | POA: Insufficient documentation

## 2012-01-27 LAB — IRON AND TIBC
Iron: 26 ug/dL — ABNORMAL LOW (ref 42–135)
UIBC: 136 ug/dL (ref 125–400)

## 2012-01-27 LAB — POCT HEMOGLOBIN-HEMACUE: Hemoglobin: 10 g/dL — ABNORMAL LOW (ref 12.0–15.0)

## 2012-01-27 MED ORDER — EPOETIN ALFA 20000 UNIT/ML IJ SOLN: 20000.0000 [IU] | INTRAMUSCULAR | Status: DC

## 2012-01-27 MED ORDER — CLONIDINE HCL 0.1 MG PO TABS
ORAL_TABLET | ORAL | Status: AC
  Filled 2012-01-27: qty 1

## 2012-01-27 MED ORDER — CLONIDINE HCL 0.1 MG PO TABS
0.1000 mg | ORAL_TABLET | Freq: Once | ORAL | Status: AC | PRN
  Administered 2012-01-27: 0.1 mg via ORAL

## 2012-02-09 ENCOUNTER — Other Ambulatory Visit (HOSPITAL_COMMUNITY): Payer: Self-pay | Admitting: *Deleted

## 2012-02-11 ENCOUNTER — Encounter (HOSPITAL_COMMUNITY)
Admission: RE | Admit: 2012-02-11 | Discharge: 2012-02-11 | Disposition: A | Discharge status: Home / Self Care | Payer: PRIVATE HEALTH INSURANCE | Source: Ambulatory Visit | Attending: Nephrology | Admitting: Nephrology

## 2012-02-11 DIAGNOSIS — N183 Chronic kidney disease, stage 3 unspecified: Secondary | ICD-10-CM | POA: Insufficient documentation

## 2012-02-11 DIAGNOSIS — D638 Anemia in other chronic diseases classified elsewhere: Secondary | ICD-10-CM | POA: Insufficient documentation

## 2012-02-11 DIAGNOSIS — E785 Hyperlipidemia, unspecified: Secondary | ICD-10-CM | POA: Insufficient documentation

## 2012-02-11 LAB — IRON AND TIBC
Iron: 791 ug/dL — ABNORMAL HIGH (ref 42–135)
UIBC: <15 ug/dL — ABNORMAL LOW (ref 125–400)

## 2012-02-11 LAB — POCT HEMOGLOBIN-HEMACUE: Hemoglobin: 7.9 g/dL — ABNORMAL LOW (ref 12.0–15.0)

## 2012-02-11 MED ORDER — SODIUM CHLORIDE 0.9 % IV SOLN
INTRAVENOUS | Status: DC
  Administered 2012-02-11: 15:00:00 via INTRAVENOUS

## 2012-02-11 MED ORDER — EPOETIN ALFA 10000 UNIT/ML IJ SOLN
INTRAMUSCULAR | Status: AC
  Administered 2012-02-11: 10000 [IU]
  Filled 2012-02-11: qty 1

## 2012-02-11 MED ORDER — FERUMOXYTOL INJECTION 510 MG/17 ML
510.0000 mg | INTRAVENOUS | Status: DC
  Administered 2012-02-11: 510 mg via INTRAVENOUS
  Filled 2012-02-11: qty 17

## 2012-02-11 MED ORDER — EPOETIN ALFA 10000 UNIT/ML IJ SOLN: 10000.0000 [IU] | Freq: Once | INTRAMUSCULAR | Status: DC

## 2012-02-11 MED ORDER — EPOETIN ALFA 20000 UNIT/ML IJ SOLN
20000.0000 [IU] | INTRAMUSCULAR | Status: DC
  Administered 2012-02-11: 20000 [IU] via SUBCUTANEOUS
  Filled 2012-02-11: qty 1

## 2012-02-18 ENCOUNTER — Encounter (HOSPITAL_COMMUNITY)
Admission: RE | Admit: 2012-02-18 | Discharge: 2012-02-18 | Disposition: A | Discharge status: Home / Self Care | Payer: PRIVATE HEALTH INSURANCE | Source: Ambulatory Visit | Attending: Nephrology | Admitting: Nephrology

## 2012-02-18 MED ORDER — FERUMOXYTOL INJECTION 510 MG/17 ML
510.0000 mg | INTRAVENOUS | Status: AC
  Administered 2012-02-18: 510 mg via INTRAVENOUS
  Filled 2012-02-18: qty 17

## 2012-02-18 MED ORDER — SODIUM CHLORIDE 0.9 % IV SOLN
INTRAVENOUS | Status: AC
  Administered 2012-02-18: 14:00:00 via INTRAVENOUS

## 2012-02-27 ENCOUNTER — Encounter (HOSPITAL_COMMUNITY)
Admission: RE | Admit: 2012-02-27 | Discharge: 2012-02-27 | Disposition: A | Discharge status: Home / Self Care | Payer: PRIVATE HEALTH INSURANCE | Source: Ambulatory Visit | Attending: Nephrology | Admitting: Nephrology

## 2012-02-27 LAB — CBC
HCT: 27.6 % — ABNORMAL LOW (ref 36.0–46.0)
MCH: 25.8 pg — ABNORMAL LOW (ref 26.0–34.0)
MCV: 80.9 fL (ref 78.0–100.0)
Platelets: 342 10*3/uL (ref 150–400)
RDW: 16.6 % — ABNORMAL HIGH (ref 11.5–15.5)
WBC: 6.9 10*3/uL (ref 4.0–10.5)

## 2012-02-27 LAB — IRON AND TIBC
Iron: 49 ug/dL (ref 42–135)
TIBC: 194 ug/dL — ABNORMAL LOW (ref 250–470)

## 2012-02-27 MED ORDER — EPOETIN ALFA 20000 UNIT/ML IJ SOLN: 20000.0000 [IU] | INTRAMUSCULAR | Status: DC

## 2012-03-01 ENCOUNTER — Telehealth: Payer: Self-pay | Admitting: Family Medicine

## 2012-03-01 ENCOUNTER — Ambulatory Visit (INDEPENDENT_AMBULATORY_CARE_PROVIDER_SITE_OTHER): Payer: PRIVATE HEALTH INSURANCE | Admitting: Emergency Medicine

## 2012-03-01 ENCOUNTER — Inpatient Hospital Stay (HOSPITAL_COMMUNITY): Admission: RE | Admit: 2012-03-01 | Payer: PRIVATE HEALTH INSURANCE | Source: Ambulatory Visit

## 2012-03-01 VITALS — BP 193/75 | HR 78 | Temp 98.3°F | Resp 18 | Ht 66.0 in | Wt 130.0 lb

## 2012-03-01 DIAGNOSIS — IMO0001 Reserved for inherently not codable concepts without codable children: Secondary | ICD-10-CM

## 2012-03-01 DIAGNOSIS — G510 Bell's palsy: Secondary | ICD-10-CM

## 2012-03-01 DIAGNOSIS — I10 Essential (primary) hypertension: Secondary | ICD-10-CM

## 2012-03-01 DIAGNOSIS — M329 Systemic lupus erythematosus, unspecified: Secondary | ICD-10-CM

## 2012-03-01 DIAGNOSIS — R29810 Facial weakness: Secondary | ICD-10-CM

## 2012-03-01 DIAGNOSIS — R9089 Other abnormal findings on diagnostic imaging of central nervous system: Secondary | ICD-10-CM

## 2012-03-01 DIAGNOSIS — E785 Hyperlipidemia, unspecified: Secondary | ICD-10-CM

## 2012-03-01 LAB — COMPREHENSIVE METABOLIC PANEL
ALT: <8 U/L (ref 0–35)
AST: 13 U/L (ref 0–37)
Alkaline Phosphatase: 72 U/L (ref 39–117)
Sodium: 142 mEq/L (ref 135–145)
Total Bilirubin: 0.2 mg/dL — ABNORMAL LOW (ref 0.3–1.2)
Total Protein: 5.8 g/dL — ABNORMAL LOW (ref 6.0–8.3)

## 2012-03-01 LAB — POCT URINALYSIS DIPSTICK
Glucose, UA: NEGATIVE
Leukocytes, UA: NEGATIVE
Protein, UA: >=300
Urobilinogen, UA: 0.2

## 2012-03-01 LAB — GLUCOSE, POCT (MANUAL RESULT ENTRY): POC Glucose: 149

## 2012-03-01 LAB — POCT GLYCOSYLATED HEMOGLOBIN (HGB A1C): Hemoglobin A1C: 4.4

## 2012-03-01 LAB — POCT CBC
Granulocyte percent: 61.5 %G (ref 37–80)
Hemoglobin: 9 g/dL — AB (ref 12.2–16.2)
Lymph, poc: 1.5 (ref 0.6–3.4)
MCHC: 30.7 g/dL — AB (ref 31.8–35.4)
MPV: 9.7 fL (ref 0–99.8)
POC Granulocyte: 3.4 (ref 2–6.9)
POC MID %: 10.6 %M (ref 0–12)
RDW, POC: 16.2 %

## 2012-03-01 LAB — POCT UA - MICROSCOPIC ONLY
Casts, Ur, LPF, POC: NEGATIVE
Crystals, Ur, HPF, POC: NEGATIVE

## 2012-03-01 MED ORDER — VALACYCLOVIR HCL 1 G PO TABS: 1000.0000 mg | ORAL_TABLET | Freq: Three times a day (TID) | ORAL | Status: AC

## 2012-03-01 NOTE — Progress Notes (Signed)
I have examined this patient along with the student and agree. Suspect primary diagnosis is Bell's Palsy.  Await CT results.  Fatigue and SOB are likely resultant to poor control of her other chronic illnesses.

## 2012-03-01 NOTE — Telephone Encounter (Signed)
Received call from Dr. Vear Clock at [facility] and MRI shows nothing acute, just old, deep white matter lesions and the patient is gone.  Report called to Dr. Cleta Alberts, who advises referral to Neuro for abn MRI and Bells Palsy followup.  Patient notified and I will put in referral.

## 2012-03-01 NOTE — Progress Notes (Signed)
  Subjective:    Patient ID: [name] [name], female    DOB: 10-Jul-1961, 51 y.o.   MRN: [id number]  HPI patient enters with onset over the weekend of any weakness in the left side of her face. She has not had any headaches weakness dizziness. She does have multiple medical problems.    Review of Systems She does not have any numbness weakness in her arms or legs. She has not any trouble with her speech. She's not had a headache     Objective:   Physical Exam physical exam pupils equal reactive to light cranial nerve exam reveals a dense left seventh nerve palsy the neck is supple. Deep tendon reflexes upper and lower extremities are 2+ motor strength 5 out of 5 in all muscle groups.        Assessment & Plan:  Suspect this is a dense left seventh nerve palsy. We'll treat without Trexan Lacri-Lube. We'll withhold steroids due to the history of diabetes high blood pressure and poor compliance with medications.

## 2012-03-01 NOTE — Progress Notes (Signed)
Call from Dr. Vear Clock at [facility] with report of CT scan.  Reports an area deep in the right white matter concerning for ischemia.  No evidence of bleeding.  Recommend MRI brain, without contrast.  Spoke with patient to advise of the findings and the plan. Also reviewed findings with Dr. Cleta Alberts.

## 2012-03-01 NOTE — Progress Notes (Signed)
Addended by: Fernande Bras on: 03/01/2012 03:05 PM   Modules accepted: Orders

## 2012-03-01 NOTE — Patient Instructions (Addendum)
You are going to have a Head CT. Your test will be performed at [facility]  Located at: [address] [place], [place]  [phone] () ? The radiologists will contact us with the results of your test.    Please wait at [facility] until we contact you with your results.   If everything is normal, please go to the pharmacy and pick up the prescription for Valtrex. Please take this medication as directed and drink plenty of water.  You should use paper tape or an eye patch at night to close your left eyelid.  You need to purchase Systane Balance eye drops or other lubricating eye drops and use them as directed to keep the left eye moist. Please return to [place] in 7 days to re evaluate your symptoms and address other health issues including your elevated blood pressures.

## 2012-03-01 NOTE — Progress Notes (Signed)
Subjective:    Patient ID: [name] [name], female    DOB: 11-17-1960, 51 y.o.   MRN: [id number]  HPI [name] [name] is a 51 y/o AA female with a history of HTN, DM II, SLE, and hyperlipidemia who presents today with a cc facial drooping.  [name] [name] states that it began Saturday 4/27.  [name] [name] went in to work today and coworkers urged her to see a Librarian, academic.  [name] [name] quit smoking last Friday (4/26) with nicotine patches byt has a 15 pack year smoking history.  [name] [name] states that her blood pressure has been "running high" for the past 2 weeks. [name] [name] has had increased fatigue and says [name] [name] can "fall asleep anywhere." [name] [name] has DOE with minimal walking- 10 -15 feet. [name] [name] denies chest pain, pressure, racing or skipping heart, nausea,vomiting, orthopnea, or PND.[name] [name] denies symptoms of claudication. [name] [name] denies, loss of vision, double vision, weakness, aphasia, or change in gait.   [name] [name] denies any recent history of fever, myalgias, chills or other constitutional sxs.  [name] [name] did have a dental abscess on the left side of her mouth last week which went away.  [name] [name] has been taking her blood pressure medications as directed.  [name] [name] does not check her blood glucose regularly.     Review of Systems as stated in HPI   Objective:   Physical Exam  Vitals reviewed. Constitutional: [name] [name] is oriented to person, place, and time.       Thin AA female with drooping left face.  Appears older than stated age. NAD.  Eyes: Conjunctivae and EOM are normal. Pupils are equal, round, and reactive to light.  Neck: Carotid bruit is not present.  Cardiovascular: Regular rhythm.   No extrasystoles are present. Exam reveals decreased pulses (dorsalis pedis and posterior tibialis. ).   Pulmonary/Chest: Breath sounds normal. No accessory muscle usage. No respiratory distress.       Effort poor, shallow breaths  Lymphadenopathy:    [name] [name] has no cervical adenopathy.  Neurological: [name] [name] is alert and oriented to person, place, and time. [name] [name] has normal strength and normal  reflexes. A cranial nerve deficit (facial weakness on left side, unable to wrinkle forehead) is present. No sensory deficit. [name] [name] displays a negative Romberg sign.   Filed Vitals:   03/01/12 1011  BP: 193/75  Pulse: 78  Temp: 98.3 F (36.8 C)  Resp: 18   Results for orders placed in visit on 03/01/12  POCT CBC      Component Value Range   WBC 5.5  4.6 - 10.2 (K/uL)   Lymph, poc 1.5  0.6 - 3.4    POC LYMPH PERCENT 27.9  10 - 50 (%L)   MID (cbc) 0.6  0 - 0.9    POC MID % 10.6  0 - 12 (%M)   POC Granulocyte 3.4  2 - 6.9    Granulocyte percent 61.5  37 - 80 (%G)   RBC 3.56 (*) 4.04 - 5.48 (M/uL)   Hemoglobin 9.0 (*) 12.2 - 16.2 (g/dL)   HCT, POC 08.6 (*) 57.8 - 47.9 (%)   MCV 82.4  80 - 97 (fL)   MCH, POC 25.3 (*) 27 - 31.2 (pg)   MCHC 30.7 (*) 31.8 - 35.4 (g/dL)   RDW, POC 46.9     Platelet Count, POC 284  142 - 424 (K/uL)   MPV 9.7  0 - 99.8 (fL)  GLUCOSE, POCT (MANUAL RESULT ENTRY)      Component Value Range   POC Glucose 149  POCT GLYCOSYLATED HEMOGLOBIN (HGB A1C)      Component Value Range   Hemoglobin A1C 4.4    POCT UA - MICROSCOPIC ONLY      Component Value Range   WBC, Ur, HPF, POC 0-2     RBC, urine, microscopic 6-8     Bacteria, U Microscopic 1+     Mucus, UA neg     Epithelial cells, urine per micros 3-6     Crystals, Ur, HPF, POC neg     Casts, Ur, LPF, POC neg     Yeast, UA neg    POCT URINALYSIS DIPSTICK      Component Value Range   Color, UA yellow     Clarity, UA clear     Glucose, UA neg     Bilirubin, UA neg     Ketones, UA neg     Spec Grav, UA 1.020     Blood, UA small     pH, UA 6.0     Protein, UA >=300     Urobilinogen, UA 0.2     Nitrite, UA neg     Leukocytes, UA Negative            Assessment & Plan:    1. Facial weakness  POCT CBC, CT Head Wo Contrast, Valtrex 1mg  TID x 7days  2. HTN (hypertension)  POCT CBC, POCT UA - Microscopic Only, POCT urinalysis dipstick, Comprehensive metabolic panel, TSH,   3. Type II or  unspecified type diabetes mellitus without mention of complication, uncontrolled  POCT glucose (manual entry), POCT glycosylated hemoglobin (Hb A1C), POCT UA - Microscopic Only, POCT urinalysis dipstick, Comprehensive metabolic panel, Microalbumin, urine  4. SLE (systemic lupus erythematosus)    5. Hyperlipidemia  Comprehensive metabolic panel

## 2012-03-01 NOTE — Progress Notes (Deleted)
  Subjective:    Patient ID: [name] [name], female    DOB: 08/05/1961, 51 y.o.   MRN: [id number]  HPI    Review of Systems     Objective:   Physical Exam        Assessment & Plan:

## 2012-03-02 ENCOUNTER — Telehealth: Payer: Self-pay

## 2012-03-02 NOTE — Telephone Encounter (Signed)
Please see students notes. They were routed to the staff message's.

## 2012-03-02 NOTE — Telephone Encounter (Signed)
Message copied by Wende Mott on Tue Mar 02, 2012  1:45 PM ------      Message from: Arthor Captain      Created: Tue Mar 02, 2012 11:13 AM      Regarding: fax results       Please also fax results of yesterday's (4/29) CT and MRI  To Dr. Hyman Hopes at [facility].      THANK YOU!

## 2012-03-02 NOTE — Telephone Encounter (Signed)
PATIENT NEEDS A WORK NOTE TO BE OUT FROM Monday 4/29 UNTIL Wednesday 03/03/2012. HER BLOOD PRESSURE IS STILL HIGH AND SHE HAS A HEADACHE. SHE WILL RETURN TO WORK ON THURS. 03/04/2012. PLEASE CALL HER WHEN IT IS READY TO BE PICKED UP. BEST PHONE [phone] (CELL)    MBC

## 2012-03-03 ENCOUNTER — Ambulatory Visit (INDEPENDENT_AMBULATORY_CARE_PROVIDER_SITE_OTHER): Payer: PRIVATE HEALTH INSURANCE | Admitting: Emergency Medicine

## 2012-03-03 VITALS — BP 188/71 | HR 79 | Temp 98.7°F | Resp 16 | Ht 65.0 in | Wt 127.6 lb

## 2012-03-03 DIAGNOSIS — I10 Essential (primary) hypertension: Secondary | ICD-10-CM

## 2012-03-03 DIAGNOSIS — C7640 Malignant neoplasm of unspecified upper limb: Secondary | ICD-10-CM

## 2012-03-03 DIAGNOSIS — G510 Bell's palsy: Secondary | ICD-10-CM

## 2012-03-03 DIAGNOSIS — Z0271 Encounter for disability determination: Secondary | ICD-10-CM

## 2012-03-03 NOTE — Progress Notes (Signed)
  Subjective:    Patient ID: [name] [name], female    DOB: 10-06-1961, 51 y.o.   MRN: [id number]  HPI patient here to followup on Bell's palsy. She had an MRI done which showed significant white matter disease on the right side of the brain consistent with microvascular disease. An appointment has been scheduled for her to followup with a neurologist before further testing is done. She has had no neurological symptoms develop since she was last here. She continues to have a dense left-sided facial weakness.    Review of Systems she has no history of lupus and renal disease is followed by Dr. Hyman Hopes the nephrologist      Objective:   Physical Exam  Neurological:       Cranial nerves reveal a dense left seventh nerve palsy. There are no other focal cranial nerve signs. Patellar reflexes are 2+ and symmetrical motor strength 5 out of 5 all muscle groups. MRI is reviewed and shows some nonspecific deep white matter and brainstem changes. Consideration to be given to be doing a carotid MRA) and brain mra in the future          Assessment & Plan:  Continue out of work for now. He is to continue the Valtrex. He did not place her on steroids because of elevated blood pressure. To check with Dr. Hyman Hopes regarding her blood pressure elevation.

## 2012-03-03 NOTE — Patient Instructions (Signed)
Bell's Palsy  Bell's palsy is a condition in which the muscles on one side of the face cannot move (paralysis). This is because the nerves in the face are paralyzed. It is most often thought to be caused by a virus. The virus causes swelling of the nerve that controls movement on one side of the face. The nerve travels through a tight space surrounded by bone. When the nerve swells, it can be compressed by the bone. This results in damage to the protective covering around the nerve. This damage interferes with how the nerve communicates with the muscles of the face. As a result, it can cause weakness or paralysis of the facial muscles.   Injury (trauma), tumor, and surgery may cause Bell's palsy, but most of the time the cause is unknown. It is a relatively common condition. It starts suddenly (abrupt onset) with the paralysis usually ending within 2 days. Bell's palsy is not dangerous. But because the eye does not close properly, you may need care to keep the eye from getting dry. This can include splinting (to keep the eye shut) or moistening with artificial tears. Bell's palsy very seldom occurs on both sides of the face at the same time.  SYMPTOMS    Eyebrow sagging.   Drooping of the eyelid and corner of the mouth.   Inability to close one eye.   Loss of taste on the front of the tongue.   Sensitivity to loud noises.  TREATMENT   The treatment is usually non-surgical. If the patient is seen within the first 24 to 48 hours, a short course of steroids may be prescribed, in an attempt to shorten the length of the condition. Antiviral medicines may also be used with the steroids, but it is unclear if they are helpful.   You will need to protect your eye, if you cannot close it. The cornea (clear covering over your eye) will become dry and can be damaged. Artificial tears can be used to keep your eye moist. Glasses or an eye patch should be worn to protect your eye.  PROGNOSIS   Recovery is variable, ranging  from days to months. Although the problem usually goes away completely (about 80% of cases resolve), predicting the outcome is impossible. Most people improve within 3 weeks of when the symptoms began. Improvement may continue for 3 to 6 months. A small number of people have moderate to severe weakness that is permanent.   HOME CARE INSTRUCTIONS    If your caregiver prescribed medication to reduce swelling in the nerve, use as directed. Do not stop taking the medication unless directed by your caregiver.   Use moisturizing eye drops as needed to prevent drying of your eye, as directed by your caregiver.   Protect your eye, as directed by your caregiver.   Use facial massage and exercises, as directed by your caregiver.   Perform your normal activities, and get your normal rest.  SEEK IMMEDIATE MEDICAL CARE IF:    There is pain, redness or irritation in the eye.   You or your child has an oral temperature above 102 F (38.9 C), not controlled by medicine.  MAKE SURE YOU:    Understand these instructions.   Will watch your condition.   Will get help right away if you are not doing well or get worse.  Document Released: 10/20/2005 Document Revised: 10/09/2011 Document Reviewed: 10/29/2009  [employer] Patient Information 2012 [employer].

## 2012-03-03 NOTE — Telephone Encounter (Signed)
LMOM notifying patient OOW note ready to pickup.  Recheck if unable to RTW tomorrow.

## 2012-03-03 NOTE — Telephone Encounter (Signed)
Please give pt OOW note for 03/01/12-03/03/12 with return to work on Thursday.  She should RTC and see Chelle if she persists in having a headache or her BP stays high another day or two.

## 2012-03-09 ENCOUNTER — Ambulatory Visit (INDEPENDENT_AMBULATORY_CARE_PROVIDER_SITE_OTHER): Payer: PRIVATE HEALTH INSURANCE | Admitting: Family Medicine

## 2012-03-09 ENCOUNTER — Ambulatory Visit: Payer: PRIVATE HEALTH INSURANCE

## 2012-03-09 VITALS — BP 171/63 | HR 73 | Temp 98.2°F | Resp 16 | Ht 65.0 in | Wt 126.0 lb

## 2012-03-09 DIAGNOSIS — M79671 Pain in right foot: Secondary | ICD-10-CM

## 2012-03-09 DIAGNOSIS — G510 Bell's palsy: Secondary | ICD-10-CM

## 2012-03-09 DIAGNOSIS — M329 Systemic lupus erythematosus, unspecified: Secondary | ICD-10-CM

## 2012-03-09 DIAGNOSIS — M79609 Pain in unspecified limb: Secondary | ICD-10-CM

## 2012-03-09 DIAGNOSIS — N049 Nephrotic syndrome with unspecified morphologic changes: Secondary | ICD-10-CM

## 2012-03-09 MED ORDER — HYDROCODONE-ACETAMINOPHEN 5-500 MG PO CAPS: 1.0000 | ORAL_CAPSULE | Freq: Three times a day (TID) | ORAL | Status: DC | PRN

## 2012-03-09 NOTE — Progress Notes (Signed)
  Subjective:    Patient ID: [name] [name], female    DOB: 12-15-1960, 51 y.o.   MRN: [id number]  HPI  Patient presents in follow up of Bell's Palsy.  States palsy of (L) facial muscles unchanged. See MRI report Visit with Dr. Allena Katz who reassured patient of temporary nature to facial droop. He did not feel additional testing necessary nor were any changes made to medication regimen.   Today complains of (R) foot pain; no injury and denies history of inflammatory arthropathy. Pain upon weight bearing.  Review of Systems     Objective:   Physical Exam  Constitutional:       Chronically ill appearing  HENT:  Mouth/Throat: Oropharynx is clear and moist.  Neck: Neck supple.       No bruits  Cardiovascular: Normal rate, regular rhythm and normal heart sounds.   Pulmonary/Chest: Effort normal and breath sounds normal.  Musculoskeletal:       Right foot: She exhibits tenderness. She exhibits normal range of motion, no swelling and no deformity.       Plantar surface of (R) forth phalanx and proximal aspect of forth metatarsal very tender to palpation  Neurological: She is alert. A cranial nerve deficit is present.       Dense (L) facial droop (L) eye lid fully closes     [facility] reading (PRIMARY) by  Dr. Hal Hope (R) foot ?punched out lesion distal phalanx of 4 th toe      Assessment & Plan:   1. Bell's palsy    2. Foot pain, right  Uric acid, DG Foot 2 Views Right, hydrocodone-acetaminophen (LORCET-HD) 5-500 MG per capsule  3. SLE (systemic lupus erythematosus)    4. Nephrotic syndrome      Post op shoe OK to extend FMLA with return to work 03/15/12

## 2012-03-10 ENCOUNTER — Telehealth: Payer: Self-pay | Admitting: Family Medicine

## 2012-03-10 LAB — URIC ACID: Uric Acid, Serum: 4.7 mg/dL (ref 2.4–7.0)

## 2012-03-10 NOTE — Telephone Encounter (Signed)
Can we let Maudia know that I extended patient's leave. She may RTW 03/15/12. Thanks

## 2012-03-12 ENCOUNTER — Inpatient Hospital Stay (HOSPITAL_COMMUNITY): Admission: RE | Admit: 2012-03-12 | Payer: PRIVATE HEALTH INSURANCE | Source: Ambulatory Visit

## 2012-03-16 ENCOUNTER — Encounter: Payer: Self-pay | Admitting: *Deleted

## 2012-04-13 ENCOUNTER — Other Ambulatory Visit (HOSPITAL_COMMUNITY): Payer: Self-pay | Admitting: *Deleted

## 2012-04-14 ENCOUNTER — Encounter (HOSPITAL_COMMUNITY)
Admission: RE | Admit: 2012-04-14 | Discharge: 2012-04-14 | Disposition: A | Discharge status: Home / Self Care | Payer: PRIVATE HEALTH INSURANCE | Source: Ambulatory Visit | Attending: Nephrology | Admitting: Nephrology

## 2012-04-14 DIAGNOSIS — E785 Hyperlipidemia, unspecified: Secondary | ICD-10-CM | POA: Insufficient documentation

## 2012-04-14 DIAGNOSIS — D638 Anemia in other chronic diseases classified elsewhere: Secondary | ICD-10-CM | POA: Insufficient documentation

## 2012-04-14 DIAGNOSIS — N183 Chronic kidney disease, stage 3 unspecified: Secondary | ICD-10-CM | POA: Insufficient documentation

## 2012-04-14 LAB — POCT HEMOGLOBIN-HEMACUE: Hemoglobin: 8 g/dL — ABNORMAL LOW (ref 12.0–15.0)

## 2012-04-14 MED ORDER — EPOETIN ALFA 20000 UNIT/ML IJ SOLN
20000.0000 [IU] | INTRAMUSCULAR | Status: DC
  Administered 2012-04-14: 20000 [IU] via SUBCUTANEOUS

## 2012-04-14 MED ORDER — EPOETIN ALFA 20000 UNIT/ML IJ SOLN
INTRAMUSCULAR | Status: AC
  Filled 2012-04-14: qty 1

## 2012-04-15 LAB — IRON AND TIBC: UIBC: 172 ug/dL (ref 125–400)

## 2012-04-27 ENCOUNTER — Other Ambulatory Visit (HOSPITAL_COMMUNITY): Payer: Self-pay | Admitting: *Deleted

## 2012-04-28 ENCOUNTER — Encounter (HOSPITAL_COMMUNITY)
Admission: RE | Admit: 2012-04-28 | Discharge: 2012-04-28 | Disposition: A | Discharge status: Home / Self Care | Payer: PRIVATE HEALTH INSURANCE | Source: Ambulatory Visit | Attending: Nephrology | Admitting: Nephrology

## 2012-04-28 ENCOUNTER — Encounter (HOSPITAL_COMMUNITY): Payer: PRIVATE HEALTH INSURANCE

## 2012-04-28 LAB — CBC
HCT: 28.9 % — ABNORMAL LOW (ref 36.0–46.0)
Hemoglobin: 9.3 g/dL — ABNORMAL LOW (ref 12.0–15.0)
RBC: 3.48 MIL/uL — ABNORMAL LOW (ref 3.87–5.11)

## 2012-04-28 LAB — URINALYSIS, MICROSCOPIC ONLY
Ketones, ur: NEGATIVE mg/dL
Leukocytes, UA: NEGATIVE
Nitrite: NEGATIVE
Specific Gravity, Urine: 1.013 (ref 1.005–1.030)
pH: 6.5 (ref 5.0–8.0)

## 2012-04-28 LAB — RENAL FUNCTION PANEL
CO2: 24 mEq/L (ref 19–32)
GFR calc Af Amer: 44 mL/min — ABNORMAL LOW (ref >90)
Glucose, Bld: 83 mg/dL (ref 70–99)
Potassium: 3.8 mEq/L (ref 3.5–5.1)
Sodium: 138 mEq/L (ref 135–145)

## 2012-04-28 MED ORDER — SODIUM CHLORIDE 0.9 % IV SOLN
INTRAVENOUS | Status: DC
  Administered 2012-04-28: 15:00:00 via INTRAVENOUS

## 2012-04-28 MED ORDER — EPOETIN ALFA 20000 UNIT/ML IJ SOLN
20000.0000 [IU] | INTRAMUSCULAR | Status: DC
  Administered 2012-04-28: 20000 [IU] via SUBCUTANEOUS
  Filled 2012-04-28: qty 1

## 2012-04-28 MED ORDER — FERUMOXYTOL INJECTION 510 MG/17 ML
510.0000 mg | INTRAVENOUS | Status: DC
  Administered 2012-04-28: 510 mg via INTRAVENOUS
  Filled 2012-04-28: qty 17

## 2012-04-28 NOTE — Progress Notes (Signed)
Temp reported to [facility]. Orders received

## 2012-04-30 LAB — URINE CULTURE: Colony Count: NO GROWTH

## 2012-05-07 ENCOUNTER — Encounter (HOSPITAL_COMMUNITY)
Admission: RE | Admit: 2012-05-07 | Discharge: 2012-05-07 | Disposition: A | Discharge status: Home / Self Care | Payer: PRIVATE HEALTH INSURANCE | Source: Ambulatory Visit | Attending: Nephrology | Admitting: Nephrology

## 2012-05-07 DIAGNOSIS — E785 Hyperlipidemia, unspecified: Secondary | ICD-10-CM | POA: Insufficient documentation

## 2012-05-07 DIAGNOSIS — N183 Chronic kidney disease, stage 3 unspecified: Secondary | ICD-10-CM | POA: Insufficient documentation

## 2012-05-07 DIAGNOSIS — D638 Anemia in other chronic diseases classified elsewhere: Secondary | ICD-10-CM | POA: Insufficient documentation

## 2012-05-07 MED ORDER — FERUMOXYTOL INJECTION 510 MG/17 ML
510.0000 mg | Freq: Once | INTRAVENOUS | Status: AC
  Administered 2012-05-07: 510 mg via INTRAVENOUS
  Filled 2012-05-07: qty 17

## 2012-05-07 MED ORDER — SODIUM CHLORIDE 0.9 % IV SOLN
Freq: Once | INTRAVENOUS | Status: AC
  Administered 2012-05-07: 09:00:00 via INTRAVENOUS

## 2012-05-12 ENCOUNTER — Encounter (HOSPITAL_COMMUNITY)
Admission: RE | Admit: 2012-05-12 | Discharge: 2012-05-12 | Disposition: A | Discharge status: Home / Self Care | Payer: PRIVATE HEALTH INSURANCE | Source: Ambulatory Visit | Attending: Nephrology | Admitting: Nephrology

## 2012-05-12 LAB — POCT HEMOGLOBIN-HEMACUE: Hemoglobin: 10.1 g/dL — ABNORMAL LOW (ref 12.0–15.0)

## 2012-05-12 MED ORDER — EPOETIN ALFA 20000 UNIT/ML IJ SOLN
INTRAMUSCULAR | Status: AC
  Filled 2012-05-12: qty 1

## 2012-05-12 MED ORDER — EPOETIN ALFA 20000 UNIT/ML IJ SOLN
20000.0000 [IU] | INTRAMUSCULAR | Status: DC
  Administered 2012-05-12: 20000 [IU] via SUBCUTANEOUS

## 2012-05-13 LAB — IRON AND TIBC
Iron: 35 ug/dL — ABNORMAL LOW (ref 42–135)
TIBC: 204 ug/dL — ABNORMAL LOW (ref 250–470)

## 2012-05-25 ENCOUNTER — Other Ambulatory Visit (HOSPITAL_COMMUNITY): Payer: Self-pay | Admitting: *Deleted

## 2012-05-26 ENCOUNTER — Encounter (HOSPITAL_COMMUNITY)
Admission: RE | Admit: 2012-05-26 | Discharge: 2012-05-26 | Disposition: A | Discharge status: Home / Self Care | Payer: PRIVATE HEALTH INSURANCE | Source: Ambulatory Visit | Attending: Nephrology | Admitting: Nephrology

## 2012-05-26 ENCOUNTER — Encounter (HOSPITAL_COMMUNITY): Payer: PRIVATE HEALTH INSURANCE

## 2012-05-26 MED ORDER — FERUMOXYTOL INJECTION 510 MG/17 ML
510.0000 mg | INTRAVENOUS | Status: DC
  Administered 2012-05-26: 510 mg via INTRAVENOUS
  Filled 2012-05-26: qty 17

## 2012-05-26 MED ORDER — SODIUM CHLORIDE 0.9 % IV SOLN
INTRAVENOUS | Status: DC
  Administered 2012-05-26: 14:00:00 via INTRAVENOUS

## 2012-05-26 MED ORDER — EPOETIN ALFA 20000 UNIT/ML IJ SOLN
INTRAMUSCULAR | Status: AC
  Administered 2012-05-26: 20000 [IU]
  Filled 2012-05-26: qty 1

## 2012-05-26 MED ORDER — EPOETIN ALFA 20000 UNIT/ML IJ SOLN: 20000.0000 [IU] | INTRAMUSCULAR | Status: DC

## 2012-05-26 MED ORDER — HYDROMORPHONE HCL PF 1 MG/ML IJ SOLN
INTRAMUSCULAR | Status: AC
  Filled 2012-05-26: qty 1

## 2012-06-02 ENCOUNTER — Encounter (HOSPITAL_COMMUNITY)
Admission: RE | Admit: 2012-06-02 | Discharge: 2012-06-02 | Disposition: A | Discharge status: Home / Self Care | Payer: PRIVATE HEALTH INSURANCE | Source: Ambulatory Visit | Attending: Nephrology | Admitting: Nephrology

## 2012-06-02 MED ORDER — SODIUM CHLORIDE 0.9 % IV SOLN
INTRAVENOUS | Status: AC
  Administered 2012-06-02: 14:00:00 via INTRAVENOUS

## 2012-06-02 MED ORDER — FERUMOXYTOL INJECTION 510 MG/17 ML
510.0000 mg | INTRAVENOUS | Status: AC
  Administered 2012-06-02: 510 mg via INTRAVENOUS
  Filled 2012-06-02: qty 17

## 2012-06-04 ENCOUNTER — Encounter: Payer: Self-pay | Admitting: Physician Assistant

## 2012-06-09 ENCOUNTER — Encounter (HOSPITAL_COMMUNITY)
Admission: RE | Admit: 2012-06-09 | Discharge: 2012-06-09 | Disposition: A | Discharge status: Home / Self Care | Payer: PRIVATE HEALTH INSURANCE | Source: Ambulatory Visit | Attending: Nephrology | Admitting: Nephrology

## 2012-06-09 DIAGNOSIS — E785 Hyperlipidemia, unspecified: Secondary | ICD-10-CM | POA: Insufficient documentation

## 2012-06-09 DIAGNOSIS — D638 Anemia in other chronic diseases classified elsewhere: Secondary | ICD-10-CM | POA: Insufficient documentation

## 2012-06-09 DIAGNOSIS — N183 Chronic kidney disease, stage 3 unspecified: Secondary | ICD-10-CM | POA: Insufficient documentation

## 2012-06-09 MED ORDER — EPOETIN ALFA 20000 UNIT/ML IJ SOLN
INTRAMUSCULAR | Status: AC
  Administered 2012-06-09: 20000 [IU] via SUBCUTANEOUS
  Filled 2012-06-09: qty 1

## 2012-06-09 MED ORDER — EPOETIN ALFA 20000 UNIT/ML IJ SOLN
20000.0000 [IU] | INTRAMUSCULAR | Status: DC
  Administered 2012-06-09: 20000 [IU] via SUBCUTANEOUS

## 2012-06-22 ENCOUNTER — Other Ambulatory Visit (HOSPITAL_COMMUNITY): Payer: Self-pay | Admitting: *Deleted

## 2012-06-23 ENCOUNTER — Encounter (HOSPITAL_COMMUNITY)
Admission: RE | Admit: 2012-06-23 | Discharge: 2012-06-23 | Disposition: A | Discharge status: Home / Self Care | Payer: PRIVATE HEALTH INSURANCE | Source: Ambulatory Visit | Attending: Nephrology | Admitting: Nephrology

## 2012-06-23 LAB — POCT HEMOGLOBIN-HEMACUE: Hemoglobin: 12.5 g/dL (ref 12.0–15.0)

## 2012-06-23 MED ORDER — EPOETIN ALFA 20000 UNIT/ML IJ SOLN: 20000.0000 [IU] | INTRAMUSCULAR | Status: DC

## 2012-07-07 ENCOUNTER — Encounter (HOSPITAL_COMMUNITY)
Admission: RE | Admit: 2012-07-07 | Discharge: 2012-07-07 | Disposition: A | Discharge status: Home / Self Care | Payer: PRIVATE HEALTH INSURANCE | Source: Ambulatory Visit | Attending: Nephrology | Admitting: Nephrology

## 2012-07-07 DIAGNOSIS — E785 Hyperlipidemia, unspecified: Secondary | ICD-10-CM | POA: Insufficient documentation

## 2012-07-07 DIAGNOSIS — D638 Anemia in other chronic diseases classified elsewhere: Secondary | ICD-10-CM | POA: Insufficient documentation

## 2012-07-07 DIAGNOSIS — N183 Chronic kidney disease, stage 3 unspecified: Secondary | ICD-10-CM | POA: Insufficient documentation

## 2012-07-07 LAB — IRON AND TIBC
Iron: 42 ug/dL (ref 42–135)
Saturation Ratios: 24 % (ref 20–55)
UIBC: 136 ug/dL (ref 125–400)

## 2012-07-07 LAB — POCT HEMOGLOBIN-HEMACUE: Hemoglobin: 10.1 g/dL — ABNORMAL LOW (ref 12.0–15.0)

## 2012-07-07 MED ORDER — EPOETIN ALFA 20000 UNIT/ML IJ SOLN
INTRAMUSCULAR | Status: AC
  Administered 2012-07-07: 20000 [IU] via SUBCUTANEOUS
  Filled 2012-07-07: qty 1

## 2012-07-07 MED ORDER — EPOETIN ALFA 20000 UNIT/ML IJ SOLN
20000.0000 [IU] | INTRAMUSCULAR | Status: DC
  Administered 2012-07-07: 20000 [IU] via SUBCUTANEOUS

## 2012-07-21 ENCOUNTER — Encounter (HOSPITAL_COMMUNITY)
Admission: RE | Admit: 2012-07-21 | Discharge: 2012-07-21 | Disposition: A | Discharge status: Home / Self Care | Payer: PRIVATE HEALTH INSURANCE | Source: Ambulatory Visit | Attending: Nephrology | Admitting: Nephrology

## 2012-07-21 MED ORDER — EPOETIN ALFA 20000 UNIT/ML IJ SOLN
20000.0000 [IU] | INTRAMUSCULAR | Status: DC
  Administered 2012-07-21: 20000 [IU] via SUBCUTANEOUS
  Filled 2012-07-21: qty 1

## 2012-07-22 LAB — POCT HEMOGLOBIN-HEMACUE: Hemoglobin: 11 g/dL — ABNORMAL LOW (ref 12.0–15.0)

## 2012-08-04 ENCOUNTER — Encounter (HOSPITAL_COMMUNITY)
Admission: RE | Admit: 2012-08-04 | Discharge: 2012-08-04 | Disposition: A | Discharge status: Home / Self Care | Payer: PRIVATE HEALTH INSURANCE | Source: Ambulatory Visit | Attending: Nephrology | Admitting: Nephrology

## 2012-08-04 DIAGNOSIS — N183 Chronic kidney disease, stage 3 unspecified: Secondary | ICD-10-CM | POA: Insufficient documentation

## 2012-08-04 DIAGNOSIS — D638 Anemia in other chronic diseases classified elsewhere: Secondary | ICD-10-CM | POA: Insufficient documentation

## 2012-08-04 DIAGNOSIS — E785 Hyperlipidemia, unspecified: Secondary | ICD-10-CM | POA: Insufficient documentation

## 2012-08-04 LAB — IRON AND TIBC
Saturation Ratios: 32 % (ref 20–55)
TIBC: 170 ug/dL — ABNORMAL LOW (ref 250–470)
UIBC: 116 ug/dL — ABNORMAL LOW (ref 125–400)

## 2012-08-04 MED ORDER — EPOETIN ALFA 20000 UNIT/ML IJ SOLN
20000.0000 [IU] | INTRAMUSCULAR | Status: DC
Start: 2012-08-04 — End: 2012-08-05
  Administered 2012-08-04: 20000 [IU] via SUBCUTANEOUS
  Filled 2012-08-04: qty 1

## 2012-08-10 ENCOUNTER — Ambulatory Visit: Payer: PRIVATE HEALTH INSURANCE

## 2012-08-10 ENCOUNTER — Ambulatory Visit (INDEPENDENT_AMBULATORY_CARE_PROVIDER_SITE_OTHER): Payer: PRIVATE HEALTH INSURANCE | Admitting: Family Medicine

## 2012-08-10 VITALS — BP 178/74 | HR 76 | Temp 98.1°F | Resp 16 | Ht 65.0 in | Wt 133.2 lb

## 2012-08-10 DIAGNOSIS — M79671 Pain in right foot: Secondary | ICD-10-CM

## 2012-08-10 DIAGNOSIS — I10 Essential (primary) hypertension: Secondary | ICD-10-CM

## 2012-08-10 DIAGNOSIS — M79609 Pain in unspecified limb: Secondary | ICD-10-CM

## 2012-08-10 DIAGNOSIS — S92901A Unspecified fracture of right foot, initial encounter for closed fracture: Secondary | ICD-10-CM

## 2012-08-10 DIAGNOSIS — S92909A Unspecified fracture of unspecified foot, initial encounter for closed fracture: Secondary | ICD-10-CM

## 2012-08-10 MED ORDER — VITAMIN D3 125 MCG (5000 UT) PO CAPS: 5000.0000 [IU] | ORAL_CAPSULE | ORAL | Status: DC

## 2012-08-10 MED ORDER — HYDROCODONE-ACETAMINOPHEN 5-500 MG PO CAPS: 1.0000 | ORAL_CAPSULE | Freq: Three times a day (TID) | ORAL | Status: AC | PRN

## 2012-08-10 MED ORDER — SPIRONOLACTONE 25 MG PO TABS: 25.0000 mg | ORAL_TABLET | Freq: Every day | ORAL | Status: DC

## 2012-08-10 NOTE — Progress Notes (Signed)
51 yo woman with SLE, diabetes, and hypertension who misstepped on rock on sidewalk and twisted right foot two days ago with immediate swelling and pain.  The swelling has gone down some, but the pain persists, particularly with weight bearing.  Also needs the aldactone 25 mg qd refilled.  Objective:  NAD Thin woman using cane Right Vth metatarsal is tender and swollen. [facility] reading (PRIMARY) by  Dr. Milus Glazier foot xray:  fx prox Vth metatarsal  A: 1. Foot pain, right  DG Foot Complete Right, hydrocodone-acetaminophen (LORCET-HD) 5-500 MG per capsule  2. Hypertension  spironolactone (ALDACTONE) 25 MG tablet  3. Foot fracture, right     .

## 2012-08-11 LAB — VITAMIN D 25 HYDROXY (VIT D DEFICIENCY, FRACTURES): Vit D, 25-Hydroxy: <10 ng/mL — ABNORMAL LOW (ref 30–89)

## 2012-08-12 ENCOUNTER — Telehealth: Payer: Self-pay

## 2012-08-12 NOTE — Telephone Encounter (Signed)
It looks like Dr L sent her in Vitamin D, she is questioning dose, pharmacy advised Dr Elbert Ewings may want her to take 50,000 units, please advise.

## 2012-08-12 NOTE — Telephone Encounter (Signed)
Pt calling to talk to the nurse about the vitamins we told her to take please call her at [phone]

## 2012-08-13 MED ORDER — VITAMIN D3 1.25 MG (50000 UT) PO CAPS: 1.0000 | ORAL_CAPSULE | ORAL | Status: DC

## 2012-08-13 NOTE — Telephone Encounter (Signed)
50,000 units is correct dose twice a week

## 2012-08-13 NOTE — Telephone Encounter (Signed)
Correct Rx is sent in for her, her phone was busy unable to let her know.

## 2012-08-14 NOTE — Telephone Encounter (Signed)
Lm for her to advise this was corrected for her at the pharmacy.

## 2012-08-17 ENCOUNTER — Ambulatory Visit: Payer: PRIVATE HEALTH INSURANCE

## 2012-08-17 ENCOUNTER — Ambulatory Visit (INDEPENDENT_AMBULATORY_CARE_PROVIDER_SITE_OTHER): Payer: PRIVATE HEALTH INSURANCE | Admitting: Family Medicine

## 2012-08-17 VITALS — BP 140/92 | HR 90 | Temp 98.2°F | Resp 18 | Ht 60.0 in | Wt 135.0 lb

## 2012-08-17 DIAGNOSIS — S92309A Fracture of unspecified metatarsal bone(s), unspecified foot, initial encounter for closed fracture: Secondary | ICD-10-CM

## 2012-08-17 DIAGNOSIS — M79609 Pain in unspecified limb: Secondary | ICD-10-CM

## 2012-08-17 NOTE — Progress Notes (Signed)
51 yo woman with SLE, diabetes, and hypertension who misstepped on rock on sidewalk and twisted right foot 9 days ago with immediate swelling and pain. The swelling has gone down some, but the pain persists, particularly with weight bearing.    Objective: NAD  Thin woman using cam walker Right Vth metatarsal is tender and swollen. [facility] reading (PRIMARY) by  Dr. Milus Glazier:  Right foot -no significant change in fracture  Assessment: jones type fx  Plan:  Continue Cam walker Recheck in 2 weeks No work until stable .

## 2012-08-18 ENCOUNTER — Encounter (HOSPITAL_COMMUNITY): Payer: PRIVATE HEALTH INSURANCE

## 2012-08-31 ENCOUNTER — Encounter (HOSPITAL_COMMUNITY)
Admission: RE | Admit: 2012-08-31 | Discharge: 2012-08-31 | Disposition: A | Discharge status: Home / Self Care | Payer: PRIVATE HEALTH INSURANCE | Source: Ambulatory Visit | Attending: Nephrology | Admitting: Nephrology

## 2012-08-31 LAB — POCT HEMOGLOBIN-HEMACUE: Hemoglobin: 13.4 g/dL (ref 12.0–15.0)

## 2012-08-31 LAB — IRON AND TIBC: UIBC: 147 ug/dL (ref 125–400)

## 2012-08-31 MED ORDER — EPOETIN ALFA 20000 UNIT/ML IJ SOLN: 20000.0000 [IU] | INTRAMUSCULAR | Status: DC

## 2012-09-04 ENCOUNTER — Encounter: Payer: Self-pay | Admitting: Family Medicine

## 2012-09-04 ENCOUNTER — Ambulatory Visit (INDEPENDENT_AMBULATORY_CARE_PROVIDER_SITE_OTHER): Payer: PRIVATE HEALTH INSURANCE | Admitting: Family Medicine

## 2012-09-04 ENCOUNTER — Ambulatory Visit: Payer: PRIVATE HEALTH INSURANCE

## 2012-09-04 VITALS — BP 159/70 | HR 76 | Temp 98.0°F | Resp 16 | Ht 65.5 in | Wt 135.0 lb

## 2012-09-04 DIAGNOSIS — M79609 Pain in unspecified limb: Secondary | ICD-10-CM

## 2012-09-04 DIAGNOSIS — S92309A Fracture of unspecified metatarsal bone(s), unspecified foot, initial encounter for closed fracture: Secondary | ICD-10-CM

## 2012-09-04 DIAGNOSIS — I10 Essential (primary) hypertension: Secondary | ICD-10-CM

## 2012-09-04 DIAGNOSIS — S92909A Unspecified fracture of unspecified foot, initial encounter for closed fracture: Secondary | ICD-10-CM

## 2012-09-04 NOTE — Progress Notes (Signed)
51 yo woman with SLE, diabetes, and hypertension who misstepped on rock on sidewalk and twisted right foot 28 days ago with immediate swelling and pain. The swelling has gone as well as the pain even with weight bearing.   Objective: NAD  Thin woman using cam walker  Right Vth metatarsal is nontender and without swelling [facility] reading (PRIMARY) by Dr. Milus Glazier: Right foot -no displacement.  Some blurring of fracture lines suggests new bone  Assessment: jones type fx, healing well  Plan:   Encourage smoking cessation Sweedo ankle brace Recheck 2 weeks.

## 2012-09-13 ENCOUNTER — Other Ambulatory Visit (HOSPITAL_COMMUNITY): Payer: Self-pay | Admitting: *Deleted

## 2012-09-14 ENCOUNTER — Encounter (HOSPITAL_COMMUNITY): Payer: PRIVATE HEALTH INSURANCE

## 2012-09-17 ENCOUNTER — Encounter (HOSPITAL_COMMUNITY)
Admission: RE | Admit: 2012-09-17 | Discharge: 2012-09-17 | Disposition: A | Discharge status: Home / Self Care | Payer: PRIVATE HEALTH INSURANCE | Source: Ambulatory Visit | Attending: Nephrology | Admitting: Nephrology

## 2012-09-17 DIAGNOSIS — N183 Chronic kidney disease, stage 3 unspecified: Secondary | ICD-10-CM | POA: Insufficient documentation

## 2012-09-17 DIAGNOSIS — D638 Anemia in other chronic diseases classified elsewhere: Secondary | ICD-10-CM | POA: Insufficient documentation

## 2012-09-17 DIAGNOSIS — E785 Hyperlipidemia, unspecified: Secondary | ICD-10-CM | POA: Insufficient documentation

## 2012-09-17 LAB — POCT HEMOGLOBIN-HEMACUE: Hemoglobin: 11.2 g/dL — ABNORMAL LOW (ref 12.0–15.0)

## 2012-09-17 MED ORDER — EPOETIN ALFA 20000 UNIT/ML IJ SOLN
INTRAMUSCULAR | Status: AC
  Filled 2012-09-17: qty 1

## 2012-09-17 MED ORDER — EPOETIN ALFA 20000 UNIT/ML IJ SOLN
20000.0000 [IU] | INTRAMUSCULAR | Status: DC
  Administered 2012-09-17: 20000 [IU] via SUBCUTANEOUS

## 2012-10-01 ENCOUNTER — Encounter (HOSPITAL_COMMUNITY)
Admission: RE | Admit: 2012-10-01 | Discharge: 2012-10-01 | Disposition: A | Discharge status: Home / Self Care | Payer: PRIVATE HEALTH INSURANCE | Source: Ambulatory Visit | Attending: Nephrology | Admitting: Nephrology

## 2012-10-01 LAB — IRON AND TIBC
Iron: 44 ug/dL (ref 42–135)
UIBC: 138 ug/dL (ref 125–400)

## 2012-10-01 MED ORDER — EPOETIN ALFA 20000 UNIT/ML IJ SOLN: 20000.0000 [IU] | INTRAMUSCULAR | Status: DC

## 2012-10-13 ENCOUNTER — Encounter (HOSPITAL_COMMUNITY)
Admission: RE | Admit: 2012-10-13 | Discharge: 2012-10-13 | Disposition: A | Discharge status: Home / Self Care | Payer: PRIVATE HEALTH INSURANCE | Source: Ambulatory Visit | Attending: Nephrology | Admitting: Nephrology

## 2012-10-13 DIAGNOSIS — E785 Hyperlipidemia, unspecified: Secondary | ICD-10-CM | POA: Insufficient documentation

## 2012-10-13 DIAGNOSIS — N183 Chronic kidney disease, stage 3 unspecified: Secondary | ICD-10-CM | POA: Insufficient documentation

## 2012-10-13 DIAGNOSIS — D638 Anemia in other chronic diseases classified elsewhere: Secondary | ICD-10-CM | POA: Insufficient documentation

## 2012-10-13 MED ORDER — EPOETIN ALFA 20000 UNIT/ML IJ SOLN
INTRAMUSCULAR | Status: AC
  Administered 2012-10-13: 20000 [IU] via SUBCUTANEOUS
  Filled 2012-10-13: qty 1

## 2012-10-13 MED ORDER — EPOETIN ALFA 20000 UNIT/ML IJ SOLN: 20000.0000 [IU] | INTRAMUSCULAR | Status: DC

## 2012-10-28 ENCOUNTER — Encounter (HOSPITAL_COMMUNITY)
Admission: RE | Admit: 2012-10-28 | Discharge: 2012-10-28 | Disposition: A | Discharge status: Home / Self Care | Payer: PRIVATE HEALTH INSURANCE | Source: Ambulatory Visit | Attending: Nephrology | Admitting: Nephrology

## 2012-10-28 LAB — POCT HEMOGLOBIN-HEMACUE: Hemoglobin: 11.7 g/dL — ABNORMAL LOW (ref 12.0–15.0)

## 2012-10-28 MED ORDER — EPOETIN ALFA 20000 UNIT/ML IJ SOLN
INTRAMUSCULAR | Status: AC
  Filled 2012-10-28: qty 1

## 2012-10-28 MED ORDER — EPOETIN ALFA 20000 UNIT/ML IJ SOLN
20000.0000 [IU] | INTRAMUSCULAR | Status: DC
  Administered 2012-10-28: 20000 [IU] via SUBCUTANEOUS

## 2012-11-11 ENCOUNTER — Encounter (HOSPITAL_COMMUNITY): Payer: PRIVATE HEALTH INSURANCE

## 2014-08-26 ENCOUNTER — Other Ambulatory Visit: Payer: Self-pay | Admitting: Nurse Practitioner

## 2014-09-28 ENCOUNTER — Encounter (HOSPITAL_COMMUNITY): Payer: Self-pay | Admitting: *Deleted

## 2014-09-28 ENCOUNTER — Inpatient Hospital Stay (HOSPITAL_COMMUNITY)
Admission: EM | Admit: 2014-09-28 | Discharge: 2014-10-05 | DRG: 065 | Disposition: A | Discharge status: Rehabilitation Facility | Payer: Managed Care, Other (non HMO) | Attending: Neurology | Admitting: Neurology

## 2014-09-28 ENCOUNTER — Emergency Department (HOSPITAL_COMMUNITY): Payer: Managed Care, Other (non HMO)

## 2014-09-28 DIAGNOSIS — I613 Nontraumatic intracerebral hemorrhage in brain stem: Secondary | ICD-10-CM | POA: Diagnosis present

## 2014-09-28 DIAGNOSIS — Z7982 Long term (current) use of aspirin: Secondary | ICD-10-CM | POA: Diagnosis not present

## 2014-09-28 DIAGNOSIS — M25579 Pain in unspecified ankle and joints of unspecified foot: Secondary | ICD-10-CM

## 2014-09-28 DIAGNOSIS — Z8673 Personal history of transient ischemic attack (TIA), and cerebral infarction without residual deficits: Secondary | ICD-10-CM | POA: Diagnosis present

## 2014-09-28 DIAGNOSIS — F17210 Nicotine dependence, cigarettes, uncomplicated: Secondary | ICD-10-CM | POA: Diagnosis present

## 2014-09-28 DIAGNOSIS — E119 Type 2 diabetes mellitus without complications: Secondary | ICD-10-CM | POA: Diagnosis present

## 2014-09-28 DIAGNOSIS — D649 Anemia, unspecified: Secondary | ICD-10-CM | POA: Diagnosis present

## 2014-09-28 DIAGNOSIS — Z9114 Patient's other noncompliance with medication regimen: Secondary | ICD-10-CM | POA: Diagnosis present

## 2014-09-28 DIAGNOSIS — R0602 Shortness of breath: Secondary | ICD-10-CM

## 2014-09-28 DIAGNOSIS — M329 Systemic lupus erythematosus, unspecified: Secondary | ICD-10-CM | POA: Diagnosis present

## 2014-09-28 DIAGNOSIS — E785 Hyperlipidemia, unspecified: Secondary | ICD-10-CM | POA: Diagnosis present

## 2014-09-28 DIAGNOSIS — I129 Hypertensive chronic kidney disease with stage 1 through stage 4 chronic kidney disease, or unspecified chronic kidney disease: Secondary | ICD-10-CM | POA: Diagnosis present

## 2014-09-28 DIAGNOSIS — R1313 Dysphagia, pharyngeal phase: Secondary | ICD-10-CM | POA: Diagnosis present

## 2014-09-28 DIAGNOSIS — G8929 Other chronic pain: Secondary | ICD-10-CM | POA: Diagnosis present

## 2014-09-28 DIAGNOSIS — N189 Chronic kidney disease, unspecified: Secondary | ICD-10-CM | POA: Diagnosis present

## 2014-09-28 DIAGNOSIS — G8191 Hemiplegia, unspecified affecting right dominant side: Secondary | ICD-10-CM | POA: Diagnosis present

## 2014-09-28 DIAGNOSIS — J9602 Acute respiratory failure with hypercapnia: Secondary | ICD-10-CM

## 2014-09-28 DIAGNOSIS — Z882 Allergy status to sulfonamides status: Secondary | ICD-10-CM | POA: Diagnosis not present

## 2014-09-28 DIAGNOSIS — I161 Hypertensive emergency: Secondary | ICD-10-CM | POA: Diagnosis present

## 2014-09-28 DIAGNOSIS — Z09 Encounter for follow-up examination after completed treatment for conditions other than malignant neoplasm: Secondary | ICD-10-CM

## 2014-09-28 DIAGNOSIS — R531 Weakness: Secondary | ICD-10-CM | POA: Diagnosis present

## 2014-09-28 DIAGNOSIS — I619 Nontraumatic intracerebral hemorrhage, unspecified: Secondary | ICD-10-CM | POA: Insufficient documentation

## 2014-09-28 DIAGNOSIS — I10 Essential (primary) hypertension: Secondary | ICD-10-CM

## 2014-09-28 DIAGNOSIS — E872 Acidosis: Secondary | ICD-10-CM | POA: Diagnosis present

## 2014-09-28 DIAGNOSIS — I639 Cerebral infarction, unspecified: Secondary | ICD-10-CM

## 2014-09-28 HISTORY — DX: Spinal stenosis, cervical region: M48.02

## 2014-09-28 HISTORY — DX: Systemic lupus erythematosus, unspecified: M32.9

## 2014-09-28 HISTORY — DX: Pain in unspecified ankle and joints of unspecified foot: M25.579

## 2014-09-28 HISTORY — DX: Essential (primary) hypertension: I10

## 2014-09-28 HISTORY — DX: Type 2 diabetes mellitus without complications: E11.9

## 2014-09-28 HISTORY — DX: Other chronic pain: G89.29

## 2014-09-28 HISTORY — DX: Fracture of unspecified metatarsal bone(s), unspecified foot, initial encounter for closed fracture: S92.309A

## 2014-09-28 HISTORY — DX: Reserved for concepts with insufficient information to code with codable children: IMO0002

## 2014-09-28 LAB — COMPREHENSIVE METABOLIC PANEL
ALBUMIN: 2.8 g/dL — AB (ref 3.5–5.2)
ALT: 20 U/L (ref 0–35)
ANION GAP: 17 — AB (ref 5–15)
AST: 27 U/L (ref 0–37)
Alkaline Phosphatase: 89 U/L (ref 39–117)
BUN: 38 mg/dL — AB (ref 6–23)
CALCIUM: 9.3 mg/dL (ref 8.4–10.5)
CHLORIDE: 108 meq/L (ref 96–112)
CO2: 16 mEq/L — ABNORMAL LOW (ref 19–32)
CREATININE: 3.2 mg/dL — AB (ref 0.50–1.10)
GFR calc Af Amer: 18 mL/min — ABNORMAL LOW (ref >90)
GFR, EST NON AFRICAN AMERICAN: 16 mL/min — AB (ref >90)
Glucose, Bld: 90 mg/dL (ref 70–99)
Potassium: 4.4 mEq/L (ref 3.7–5.3)
Sodium: 141 mEq/L (ref 137–147)
Total Protein: 7.8 g/dL (ref 6.0–8.3)

## 2014-09-28 LAB — DIFFERENTIAL
BASOS ABS: 0 10*3/uL (ref 0.0–0.1)
BASOS PCT: 0 % (ref 0–1)
BASOS PCT: 1 % (ref 0–1)
Basophils Absolute: 0 10*3/uL (ref 0.0–0.1)
EOS ABS: 0.1 10*3/uL (ref 0.0–0.7)
EOS PCT: 1 % (ref 0–5)
EOS PCT: 3 % (ref 0–5)
Eosinophils Absolute: 0.2 10*3/uL (ref 0.0–0.7)
LYMPHS ABS: 0.6 10*3/uL — AB (ref 0.7–4.0)
Lymphocytes Relative: 10 % — ABNORMAL LOW (ref 12–46)
Lymphocytes Relative: 31 % (ref 12–46)
Lymphs Abs: 1.9 10*3/uL (ref 0.7–4.0)
MONO ABS: 0.5 10*3/uL (ref 0.1–1.0)
MONOS PCT: 8 % (ref 3–12)
Monocytes Absolute: 0.3 10*3/uL (ref 0.1–1.0)
Monocytes Relative: 4 % (ref 3–12)
NEUTROS ABS: 3.6 10*3/uL (ref 1.7–7.7)
Neutro Abs: 5 10*3/uL (ref 1.7–7.7)
Neutrophils Relative %: 57 % (ref 43–77)
Neutrophils Relative %: 85 % — ABNORMAL HIGH (ref 43–77)

## 2014-09-28 LAB — I-STAT CHEM 8, ED
BUN: 39 mg/dL — AB (ref 6–23)
CALCIUM ION: 1.18 mmol/L (ref 1.12–1.23)
CREATININE: 3.5 mg/dL — AB (ref 0.50–1.10)
Chloride: 114 mEq/L — ABNORMAL HIGH (ref 96–112)
GLUCOSE: 90 mg/dL (ref 70–99)
HCT: 34 % — ABNORMAL LOW (ref 36.0–46.0)
HEMOGLOBIN: 11.6 g/dL — AB (ref 12.0–15.0)
Potassium: 4.1 mEq/L (ref 3.7–5.3)
SODIUM: 144 meq/L (ref 137–147)
TCO2: 17 mmol/L (ref 0–100)

## 2014-09-28 LAB — URINE MICROSCOPIC-ADD ON

## 2014-09-28 LAB — CBC
HCT: 30.6 % — ABNORMAL LOW (ref 36.0–46.0)
Hemoglobin: 9.7 g/dL — ABNORMAL LOW (ref 12.0–15.0)
MCH: 26.4 pg (ref 26.0–34.0)
MCHC: 31.7 g/dL (ref 30.0–36.0)
MCV: 83.4 fL (ref 78.0–100.0)
PLATELETS: 187 10*3/uL (ref 150–400)
RBC: 3.67 MIL/uL — AB (ref 3.87–5.11)
RDW: 14.2 % (ref 11.5–15.5)
WBC: 5.9 10*3/uL (ref 4.0–10.5)

## 2014-09-28 LAB — URINALYSIS, ROUTINE W REFLEX MICROSCOPIC
Bilirubin Urine: NEGATIVE
GLUCOSE, UA: NEGATIVE mg/dL
KETONES UR: NEGATIVE mg/dL
LEUKOCYTES UA: NEGATIVE
Nitrite: NEGATIVE
Protein, ur: >300 mg/dL — AB
Specific Gravity, Urine: 1.016 (ref 1.005–1.030)
Urobilinogen, UA: 0.2 mg/dL (ref 0.0–1.0)
pH: 6.5 (ref 5.0–8.0)

## 2014-09-28 LAB — CBG MONITORING, ED: Glucose-Capillary: 106 mg/dL — ABNORMAL HIGH (ref 70–99)

## 2014-09-28 LAB — I-STAT TROPONIN, ED: TROPONIN I, POC: 0.02 ng/mL (ref 0.00–0.08)

## 2014-09-28 LAB — RAPID URINE DRUG SCREEN, HOSP PERFORMED
Amphetamines: NOT DETECTED
BENZODIAZEPINES: NOT DETECTED
Barbiturates: NOT DETECTED
COCAINE: NOT DETECTED
Opiates: NOT DETECTED
Tetrahydrocannabinol: NOT DETECTED

## 2014-09-28 MED ORDER — ACETAMINOPHEN 650 MG RE SUPP: 650.0000 mg | RECTAL | Status: DC | PRN

## 2014-09-28 MED ORDER — NICARDIPINE HCL IN NACL 20-0.86 MG/200ML-% IV SOLN
5.0000 mg/h | Freq: Once | INTRAVENOUS | Status: AC
  Administered 2014-09-28: 5 mg/h via INTRAVENOUS
  Filled 2014-09-28: qty 200

## 2014-09-28 MED ORDER — STROKE: EARLY STAGES OF RECOVERY BOOK
Freq: Once | Status: AC
  Administered 2014-09-29: 1
  Filled 2014-09-28: qty 1

## 2014-09-28 MED ORDER — SODIUM CHLORIDE 0.9 % IV SOLN
INTRAVENOUS | Status: DC
  Administered 2014-09-28: 75 mL/h via INTRAVENOUS
  Administered 2014-09-29: 11:00:00 via INTRAVENOUS
  Administered 2014-09-30: 75 mL/h via INTRAVENOUS

## 2014-09-28 MED ORDER — LABETALOL HCL 5 MG/ML IV SOLN
20.0000 mg | Freq: Once | INTRAVENOUS | Status: AC
  Administered 2014-09-28: 20 mg via INTRAVENOUS
  Filled 2014-09-28: qty 4

## 2014-09-28 MED ORDER — PANTOPRAZOLE SODIUM 40 MG IV SOLR
40.0000 mg | Freq: Every day | INTRAVENOUS | Status: DC
  Administered 2014-09-29 (×2): 40 mg via INTRAVENOUS
  Filled 2014-09-28 (×2): qty 40

## 2014-09-28 MED ORDER — NICARDIPINE HCL IN NACL 20-0.86 MG/200ML-% IV SOLN
3.0000 mg/h | INTRAVENOUS | Status: DC
  Administered 2014-09-29: 5 mg/h via INTRAVENOUS
  Administered 2014-09-29: 3 mg/h via INTRAVENOUS
  Administered 2014-09-29: 15 mg/h via INTRAVENOUS
  Administered 2014-09-29: 9 mg/h via INTRAVENOUS
  Administered 2014-09-29: 7 mg/h via INTRAVENOUS
  Administered 2014-09-29: 10 mg/h via INTRAVENOUS
  Administered 2014-09-29: 5 mg/h via INTRAVENOUS
  Administered 2014-09-29: 15 mg/h via INTRAVENOUS
  Filled 2014-09-28 (×7): qty 200

## 2014-09-28 MED ORDER — SENNOSIDES-DOCUSATE SODIUM 8.6-50 MG PO TABS
1.0000 | ORAL_TABLET | Freq: Two times a day (BID) | ORAL | Status: DC
  Administered 2014-09-29 – 2014-10-05 (×10): 1 via ORAL
  Filled 2014-09-28 (×14): qty 1

## 2014-09-28 MED ORDER — ACETAMINOPHEN 325 MG PO TABS
650.0000 mg | ORAL_TABLET | ORAL | Status: DC | PRN
  Administered 2014-09-29 – 2014-10-01 (×6): 650 mg via ORAL
  Filled 2014-09-28 (×6): qty 2

## 2014-09-28 NOTE — ED Notes (Signed)
Per EMS: pt coming from home with c/o right sided numbness and gaze to the left. LSN 2055. Pt denies pain, pt is hypertensive, out of her medications for a week. Pt A&Ox4, respirations equal and unlabored, skin warm and dry

## 2014-09-28 NOTE — Code Documentation (Signed)
Ms. Nygard is a 53yo bf presenting with Rt side weakness, Lt gaze pref,, & Rt sensory deficit.  She has not taken her BP meds in over 3 weeks & sees Urgent Care for primary care.  NIH 7.  BP 258/99 on arrival labetalol 20mg  IV given x1.  BP then 238/85 & cardene gtt started.  Pt to be admitted to Neuro ICU.

## 2014-09-28 NOTE — H&P (Signed)
Admission H&P    Chief Complaint: Acute onset of right-sided weakness and numbness and gaze preference.  HPI: [name] [name] is an 53 y.o. female history of hypertension, hyperlipidemia as well as compliance issues with treatment, presenting with acute onset right-sided weakness and numbness as well as gaze to the left side. He has no previous history of stroke nor TIA. She ran out of blood pressure medicine about 3 weeks ago. Blood pressure in the emergency room was 238/88. She was initially given 20 mg of labetalol and subsequently started on Cardene drip. 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 NIH stroke score was 7.  LSN: 8:55 PM on 09/28/2014 tPA Given: No: Acute pontine hemorrhage mRankin:  Past medical history: Hypertension and hyperlipidemia  No past surgical history on file.  Family History  Problem Relation Age of Onset  . Hypertension    . Lupus    . Rheum arthritis    . Hypertension Mother   . Diabetes Mother   . Hypertension Sister   . Diabetes Father   . Hypertension Maternal Grandmother    Social History:  reports that she has been smoking Cigarettes.  She has a 15 pack-year smoking history. She does not have any smokeless tobacco history on file. She reports that she does not drink alcohol or use illicit drugs.  Allergies:  Allergies  Allergen Reactions  . Sulfa Antibiotics Itching   Medications: Patient's medication list was reviewed recently by me at the time of admission. Patient admitted to noncompliance with taking blood pressure medicine over the past 3 weeks.  ROS: History obtained from the patient  General ROS: negative for - chills, fatigue, fever, night sweats, weight gain or weight loss Psychological ROS: negative for - behavioral disorder, hallucinations, memory difficulties, mood swings or suicidal ideation Ophthalmic ROS: negative for - blurry vision, double vision, eye pain or loss of vision ENT ROS: negative for -  epistaxis, nasal discharge, oral lesions, sore throat, tinnitus or vertigo Allergy and Immunology ROS: negative for - hives or itchy/watery eyes Hematological and Lymphatic ROS: negative for - bleeding problems, bruising or swollen lymph nodes Endocrine ROS: negative for - galactorrhea, hair pattern changes, polydipsia/polyuria or temperature intolerance Respiratory ROS: negative for - cough, hemoptysis, shortness of breath or wheezing Cardiovascular ROS: negative for - chest pain, dyspnea on exertion, edema or irregular heartbeat Gastrointestinal ROS: negative for - abdominal pain, diarrhea, hematemesis, nausea/vomiting or stool incontinence Genito-Urinary ROS: negative for - dysuria, hematuria, incontinence or urinary frequency/urgency Musculoskeletal ROS: negative for - joint swelling or muscular weakness Neurological ROS: as noted in HPI Dermatological ROS: negative for rash and skin lesion changes  Physical Examination: Blood pressure 203/78, pulse 98, temperature 99 F (37.2 C), resp. rate 28, SpO2 100 %.  HEENT-  Normocephalic, no lesions, without obvious abnormality.  Normal external eye and conjunctiva.  Normal TM's bilaterally.  Normal auditory canals and external ears. Normal external nose, mucus membranes and septum.  Normal pharynx. Neck supple with no masses, nodes, nodules or enlargement. Cardiovascular - regular rate and rhythm, S1, S2 normal, no murmur, click, rub or gallop Lungs - chest clear, no wheezing, rales, normal symmetric air entry, Heart exam - S1, S2 normal, no murmur, no gallop, rate regular Abdomen - soft, non-tender; bowel sounds normal; no masses,  no organomegaly Extremities - no joint deformities, effusion, or inflammation, no edema and no skin discoloration  Neurologic Examination: Mental Status: Slightly lethargic, oriented, thought content appropriate.  Speech fluent without evidence of  aphasia. Able to follow commands without difficulty. Cranial  Nerves: II-Visual fields were normal. III/IV/VI-Pupils were equal and reacted. Extraocular movements were full and conjugate; patient had a gaze preference at rest  V/VII-there was right facial numbness to tactile stimulation on the right; no facial weakness. VIII-normal. X-normal speech. Motor: Mild drift of right upper extremity and moderate drift of right lower extremity; motor exam is otherwise unremarkable. Sensory: Sensory testing was normal and symmetrical involving the extremities. Deep Tendon Reflexes: 2+, brisk and symmetric. Plantars: Mute bilaterally Cerebellar: Impaired finger to nose and heel to shin testing on the right. Carotid auscultation: Normal  Results for orders placed or performed during the hospital encounter of 09/28/14 (from the past 48 hour(s))  Differential     Status: None   Collection Time: 09/28/14  9:53 PM  Result Value Ref Range   Neutrophils Relative % 57 43 - 77 %   Neutro Abs 3.6 1.7 - 7.7 K/uL   Lymphocytes Relative 31 12 - 46 %   Lymphs Abs 1.9 0.7 - 4.0 K/uL   Monocytes Relative 8 3 - 12 %   Monocytes Absolute 0.5 0.1 - 1.0 K/uL   Eosinophils Relative 3 0 - 5 %   Eosinophils Absolute 0.2 0.0 - 0.7 K/uL   Basophils Relative 1 0 - 1 %   Basophils Absolute 0.0 0.0 - 0.1 K/uL  Comprehensive metabolic panel     Status: Abnormal   Collection Time: 09/28/14  9:53 PM  Result Value Ref Range   Sodium 141 137 - 147 mEq/L   Potassium 4.4 3.7 - 5.3 mEq/L   Chloride 108 96 - 112 mEq/L   CO2 16 (L) 19 - 32 mEq/L   Glucose, Bld 90 70 - 99 mg/dL   BUN 38 (H) 6 - 23 mg/dL   Creatinine, Ser 2.80 (H) 0.50 - 1.10 mg/dL   Calcium 9.3 8.4 - 76.6 mg/dL   Total Protein 7.8 6.0 - 8.3 g/dL   Albumin 2.8 (L) 3.5 - 5.2 g/dL   AST 27 0 - 37 U/L   ALT 20 0 - 35 U/L   Alkaline Phosphatase 89 39 - 117 U/L   Total Bilirubin <0.2 (L) 0.3 - 1.2 mg/dL   GFR calc non Af Amer 16 (L) >90 mL/min   GFR calc Af Amer 18 (L) >90 mL/min    Comment: (NOTE) The eGFR has been  calculated using the CKD EPI equation. This calculation has not been validated in all clinical situations. eGFR's persistently <90 mL/min signify possible Chronic Kidney Disease.    Anion gap 17 (H) 5 - 15  I-stat troponin, ED (not at [facility])     Status: None   Collection Time: 09/28/14 10:00 PM  Result Value Ref Range   Troponin i, poc 0.02 0.00 - 0.08 ng/mL   Comment 3            Comment: Due to the release kinetics of cTnI, a negative result within the first hours of the onset of symptoms does not rule out myocardial infarction with certainty. If myocardial infarction is still suspected, repeat the test at appropriate intervals.   I-Stat Chem 8, ED     Status: Abnormal   Collection Time: 09/28/14 10:02 PM  Result Value Ref Range   Sodium 144 137 - 147 mEq/L   Potassium 4.1 3.7 - 5.3 mEq/L   Chloride 114 (H) 96 - 112 mEq/L   BUN 39 (H) 6 - 23 mg/dL   Creatinine, Ser 6.62 (H)  0.50 - 1.10 mg/dL   Glucose, Bld 90 70 - 99 mg/dL   Calcium, Ion 1.18 1.12 - 1.23 mmol/L   TCO2 17 0 - 100 mmol/L   Hemoglobin 11.6 (L) 12.0 - 15.0 g/dL   HCT 34.0 (L) 36.0 - 46.0 %   Ct Head (brain) Wo Contrast  09/28/2014   CLINICAL DATA:  Code stroke. Ran out of hypertensive Medicine several days ago.  EXAM: CT HEAD WITHOUT CONTRAST  TECHNIQUE: Contiguous axial images were obtained from the base of the skull through the vertex without intravenous contrast.  COMPARISON:  None.  FINDINGS: There is mild low attenuation within the subcortical white matter of the right frontoparietal lobe compatible with chronic microvascular disease. Within the pons there is a 1.1 cm hyper dense mass, image number 8/ series 2. This is consistent with pontine hemorrhage. No additional areas of hemorrhage identified. The ventricular volumes and sulci appear within normal limits. Paranasal sinuses and mastoid air cells are clear. The skull appears intact.  IMPRESSION: 1. 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 2.  Chronic microvascular disease 3. Critical Value/emergent results were called by telephone at the time of interpretation on 09/28/2014 at 10:12 pm to Dr. Wallie Char, who verbally acknowledged these results.   Electronically Signed   By: Kerby Moors M.D.   On: 09/28/2014 22:13    Assessment: 53 y.o. female with a history of hypertension and hyperlipidemia as well as compliance with treatment, presenting with acute pontine hemorrhage as well as hypertensive emergency.  Stroke Risk Factors - family history, hyperlipidemia and hypertension  Plan: 1. HgbA1c, fasting lipid panel 2. MRI, MRA  of the brain without contrast 3. PT consult, OT consult, Speech consult 4. Echocardiogram 5. Carotid dopplers 6. Prophylactic therapy-None 7. Management of hypertensive emergency with Cardene drip 8. Repeat CT scan of the head without contrast in the a.m.   C.R. Nicole Kindred, MD [facility] [phone]  09/28/2014, 10:55 PM

## 2014-09-29 ENCOUNTER — Inpatient Hospital Stay (HOSPITAL_COMMUNITY): Payer: Managed Care, Other (non HMO)

## 2014-09-29 DIAGNOSIS — I10 Essential (primary) hypertension: Secondary | ICD-10-CM | POA: Insufficient documentation

## 2014-09-29 LAB — COMPREHENSIVE METABOLIC PANEL
ALBUMIN: 2.6 g/dL — AB (ref 3.5–5.2)
ALT: 17 U/L (ref 0–35)
ANION GAP: 14 (ref 5–15)
AST: 21 U/L (ref 0–37)
Alkaline Phosphatase: 83 U/L (ref 39–117)
BUN: 39 mg/dL — AB (ref 6–23)
CALCIUM: 9 mg/dL (ref 8.4–10.5)
CO2: 17 mEq/L — ABNORMAL LOW (ref 19–32)
Chloride: 109 mEq/L (ref 96–112)
Creatinine, Ser: 3.17 mg/dL — ABNORMAL HIGH (ref 0.50–1.10)
GFR calc Af Amer: 18 mL/min — ABNORMAL LOW (ref >90)
GFR calc non Af Amer: 16 mL/min — ABNORMAL LOW (ref >90)
Glucose, Bld: 138 mg/dL — ABNORMAL HIGH (ref 70–99)
Potassium: 4.2 mEq/L (ref 3.7–5.3)
SODIUM: 140 meq/L (ref 137–147)
TOTAL PROTEIN: 7.2 g/dL (ref 6.0–8.3)
Total Bilirubin: <0.2 mg/dL — ABNORMAL LOW (ref 0.3–1.2)

## 2014-09-29 LAB — MRSA PCR SCREENING: MRSA by PCR: NEGATIVE

## 2014-09-29 LAB — PROTIME-INR
INR: 1.06 (ref 0.00–1.49)
Prothrombin Time: 13.9 seconds (ref 11.6–15.2)

## 2014-09-29 LAB — APTT: aPTT: 48 seconds — ABNORMAL HIGH (ref 24–37)

## 2014-09-29 LAB — ETHANOL: Alcohol, Ethyl (B): <11 mg/dL (ref 0–11)

## 2014-09-29 MED ORDER — ADULT MULTIVITAMIN W/MINERALS CH
1.0000 | ORAL_TABLET | Freq: Every day | ORAL | Status: DC
Start: 2014-09-29 — End: 2014-10-05
  Administered 2014-09-29 – 2014-10-05 (×6): 1 via ORAL
  Filled 2014-09-29 (×7): qty 1

## 2014-09-29 MED ORDER — LABETALOL HCL 200 MG PO TABS
200.0000 mg | ORAL_TABLET | Freq: Two times a day (BID) | ORAL | Status: DC
  Administered 2014-09-29 – 2014-09-30 (×4): 200 mg via ORAL
  Filled 2014-09-29 (×6): qty 1

## 2014-09-29 MED ORDER — HYDRALAZINE HCL 20 MG/ML IJ SOLN
INTRAMUSCULAR | Status: AC
  Filled 2014-09-29: qty 1

## 2014-09-29 MED ORDER — VITAMIN D3 25 MCG (1000 UNIT) PO TABS
5000.0000 [IU] | ORAL_TABLET | ORAL | Status: DC
Start: 2014-09-29 — End: 2014-10-05
  Administered 2014-09-29 – 2014-10-02 (×2): 5000 [IU] via ORAL
  Filled 2014-09-29 (×5): qty 5

## 2014-09-29 MED ORDER — FERROUS SULFATE 325 (65 FE) MG PO TABS
325.0000 mg | ORAL_TABLET | Freq: Every day | ORAL | Status: DC
  Administered 2014-09-29 – 2014-10-05 (×6): 325 mg via ORAL
  Filled 2014-09-29 (×8): qty 1

## 2014-09-29 MED ORDER — NICOTINE 7 MG/24HR TD PT24
7.0000 mg | MEDICATED_PATCH | Freq: Every day | TRANSDERMAL | Status: DC
  Administered 2014-09-29 – 2014-10-05 (×7): 7 mg via TRANSDERMAL
  Filled 2014-09-29 (×7): qty 1

## 2014-09-29 MED ORDER — LISINOPRIL 20 MG PO TABS
20.0000 mg | ORAL_TABLET | Freq: Every day | ORAL | Status: DC
  Administered 2014-09-29 – 2014-10-03 (×5): 20 mg via ORAL
  Filled 2014-09-29 (×5): qty 1

## 2014-09-29 MED ORDER — ALPRAZOLAM 0.5 MG PO TABS
0.5000 mg | ORAL_TABLET | Freq: Four times a day (QID) | ORAL | Status: DC | PRN
  Administered 2014-09-29 – 2014-09-30 (×4): 0.5 mg via ORAL
  Filled 2014-09-29 (×4): qty 1

## 2014-09-29 MED ORDER — NICARDIPINE HCL IN NACL 40-0.83 MG/200ML-% IV SOLN
3.0000 mg/h | INTRAVENOUS | Status: DC
  Administered 2014-09-29 – 2014-09-30 (×3): 15 mg/h via INTRAVENOUS
  Administered 2014-09-30 (×2): 9 mg/h via INTRAVENOUS
  Administered 2014-09-30: 15 mg/h via INTRAVENOUS
  Administered 2014-09-30: 8 mg/h via INTRAVENOUS
  Administered 2014-09-30: 15 mg/h via INTRAVENOUS
  Administered 2014-10-01: 4 mg/h via INTRAVENOUS
  Administered 2014-10-01 (×3): 15 mg/h via INTRAVENOUS
  Administered 2014-10-01: 5 mg/h via INTRAVENOUS
  Administered 2014-10-01 (×2): 15 mg/h via INTRAVENOUS
  Filled 2014-09-29 (×15): qty 200

## 2014-09-29 MED ORDER — AMLODIPINE BESYLATE 10 MG PO TABS
10.0000 mg | ORAL_TABLET | Freq: Every day | ORAL | Status: DC
  Administered 2014-09-29 – 2014-10-05 (×7): 10 mg via ORAL
  Filled 2014-09-29 (×7): qty 1

## 2014-09-29 MED ORDER — ADULT MULTIVITAMIN W/MINERALS CH
1.0000 | ORAL_TABLET | Freq: Every day | ORAL | Status: DC
  Filled 2014-09-29: qty 1

## 2014-09-29 MED ORDER — FUROSEMIDE 20 MG PO TABS
20.0000 mg | ORAL_TABLET | Freq: Two times a day (BID) | ORAL | Status: DC
  Administered 2014-09-29 – 2014-10-03 (×9): 20 mg via ORAL
  Filled 2014-09-29 (×12): qty 1

## 2014-09-29 MED ORDER — HYDRALAZINE HCL 20 MG/ML IJ SOLN
10.0000 mg | INTRAMUSCULAR | Status: DC | PRN
  Administered 2014-09-29 – 2014-10-03 (×3): 10 mg via INTRAVENOUS
  Filled 2014-09-29 (×3): qty 1

## 2014-09-29 MED ORDER — MYCOPHENOLATE MOFETIL 250 MG PO CAPS
1000.0000 mg | ORAL_CAPSULE | Freq: Two times a day (BID) | ORAL | Status: DC
  Administered 2014-09-29 – 2014-10-05 (×11): 1000 mg via ORAL
  Filled 2014-09-29 (×14): qty 4

## 2014-09-29 NOTE — Progress Notes (Signed)
STROKE TEAM PROGRESS NOTE   HISTORY [name] [name] is a 53 y.o. female history of hypertension, hyperlipidemia as well as compliance issues with treatment, presenting with acute onset left sided weakness and numbness as well as gaze to the right side. She has no previous history of stroke nor TIA. She ran out of blood pressure medicine about 3 weeks ago. Blood pressure in the emergency room was 238/88. She was initially given 20 mg of labetalol and subsequently started on Cardene drip. 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 NIH stroke score was 7.  LSN: 8:55 PM on 09/28/2014 tPA Given: No: Acute pontine hemorrhage   SUBJECTIVE (INTERVAL HISTORY) Patient has remained stable overnight with BP adequately controlled on cardene drip.Left sided  Numbness persists but weakness is improving.She is requesting nicotine patch for smoking cessation   OBJECTIVE Temp:  [98.2 F (36.8 C)-99 F (37.2 C)] 98.7 F (37.1 C) (11/27 0400) Pulse Rate:  [70-100] 73 (11/27 0715) Cardiac Rhythm:  [-] Normal sinus rhythm (11/27 0700) Resp:  [11-31] 14 (11/27 0715) BP: (124-238)/(49-95) 152/58 mmHg (11/27 0715) SpO2:  [99 %-100 %] 99 % (11/27 0715) Weight:  [142 lb 6.7 oz (64.6 kg)] 142 lb 6.7 oz (64.6 kg) (11/26 2345)   Recent Labs Lab 09/28/14 2229  GLUCAP 106*    Recent Labs Lab 09/28/14 2153 09/28/14 2202 09/28/14 2340  NA 141 144 140  K 4.4 4.1 4.2  CL 108 114* 109  CO2 16*  --  17*  GLUCOSE 90 90 138*  BUN 38* 39* 39*  CREATININE 3.20* 3.50* 3.17*  CALCIUM 9.3  --  9.0    Recent Labs Lab 09/28/14 2153 09/28/14 2340  AST 27 21  ALT 20 17  ALKPHOS 89 83  BILITOT <0.2* <0.2*  PROT 7.8 7.2  ALBUMIN 2.8* 2.6*    Recent Labs Lab 09/28/14 2153 09/28/14 2202 09/28/14 2340  WBC  --   --  5.9  NEUTROABS 3.6  --  5.0  HGB  --  11.6* 9.7*  HCT  --  34.0* 30.6*  MCV  --   --  83.4  PLT  --   --  187   No results for input(s): CKTOTAL, CKMB, CKMBINDEX,  TROPONINI in the last 168 hours.  Recent Labs  09/28/14 2340  LABPROT 13.9  INR 1.06    Recent Labs  09/28/14 2239  COLORURINE YELLOW  LABSPEC 1.016  PHURINE 6.5  GLUCOSEU NEGATIVE  HGBUR SMALL*  BILIRUBINUR NEGATIVE  KETONESUR NEGATIVE  PROTEINUR >300*  UROBILINOGEN 0.2  NITRITE NEGATIVE  LEUKOCYTESUR NEGATIVE       Component Value Date/Time   CHOL * 04/06/2009 1452    207        ATP III CLASSIFICATION:  <200     mg/dL   Desirable  200-239  mg/dL   Borderline High  >=240    mg/dL   High          TRIG 184* 04/06/2009 1452   HDL 24* 04/06/2009 1452   CHOLHDL 8.6 04/06/2009 1452   VLDL 37 04/06/2009 1452   LDLCALC * 04/06/2009 1452    146        Total Cholesterol/HDL:CHD Risk Coronary Heart Disease Risk Table                     Men   Women  1/2 Average Risk   3.4   3.3  Average Risk       5.0  4.4  2 X Average Risk   9.6   7.1  3 X Average Risk  23.4   11.0        Use the calculated Patient Ratio above and the CHD Risk Table to determine the patient's CHD Risk.        ATP III CLASSIFICATION (LDL):  <100     mg/dL   Optimal  100-129  mg/dL   Near or Above                    Optimal  130-159  mg/dL   Borderline  160-189  mg/dL   High  >190     mg/dL   Very High   Lab Results  Component Value Date   HGBA1C 4.4 03/01/2012      Component Value Date/Time   LABOPIA NONE DETECTED 09/28/2014 2239   COCAINSCRNUR NONE DETECTED 09/28/2014 2239   LABBENZ NONE DETECTED 09/28/2014 2239   AMPHETMU NONE DETECTED 09/28/2014 2239   THCU NONE DETECTED 09/28/2014 2239   LABBARB NONE DETECTED 09/28/2014 2239     Recent Labs Lab 09/28/14 2340  ETH <11    Ct Head (brain) Wo Contrast 09/28/2014    1. 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  2. Chronic microvascular disease     Chest Port 1 View 09/29/2014    There is no active cardiopulmonary disease. A PA and lateral chest x-ray would be useful when the patient can tolerate the procedure.    Electronically Signed   By: David  Martinique   On: 09/29/2014 07:42     PHYSICAL EXAM [name] not in distress.Awake alert. Afebrile. Head is nontraumatic. Neck is supple without bruit. Hearing is normal. Cardiac exam no murmur or gallop. Lungs are clear to auscultation. Distal pulses are well felt.  Neurological Exam :  Awake alert oriented x 3 normal speech and language. Mild left lower face asymmetry. Tongue midline. No drift. Mild diminished fine finger movements on left. Orbits right over left upper extremity. Mild left grip weak.Mild left hip flexor weakness. Diminished left hemibody touch/pinprick sensation. Normal sensation . Normal coordination.     ASSESSMENT/PLAN [name] [name] is a 53 y.o. female with history of hypertension, diabetes history, lupus, tobacco use, hyperlipidemia, and medical noncompliance,  presenting with blood pressure 238/88, right sided numbness and weakness, with left gaze preference. 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  Resultant - right hemiparesis and right hemisensory deficits.  MRI  pending  MRA    Carotid Doppler pending  2D Echo   LDL 146  HgbA1c pending  SCDs for VTE prophylaxis  Diet NPO time specified no liquids  aspirin 81 mg orally every day prior to admission, now on no antithrombotics secondary to hemorrhage.  Ongoing aggressive stroke risk factor management  Therapy recommendations:  Pending  Disposition:  Pending  Hypertension  Home meds:  Norvasc 10 mg daily, Lasix 20 mg daily, labetalol 400 mg twice a day, lisinopril 20 mg daily, spironolactone 25 mg daily, and reserpine 0.1 mg daily. Reportedly ran out of blood pressure medications 3 weeks prior to admission.  Stable - on Cardene drip.Home medicines resumed  Patient  counseled to be compliant with her blood pressure medications  Hyperlipidemia  Home meds:  The patient was not on cholesterol lowering medications prior to admission.  LDL 146, goal < 70  Add statin when access is available.  Continue statin at discharge  Diabetes  Hemoglobin A1c - pending  Other Stroke Risk Factors  Cigarette smoker, advised to stop smoking  Medical noncompliance   Other Active Problems  Anemia  Kidney disease  Other Pertinent History  Lupus - on immunosuppression.  Hospital day # 1 I have personally examined this patient, reviewed notes, independently viewed imaging studies, participated in medical decision making and plan of care. I have made any additions or clarifications directly to the above note.  Patient counselled to be compliant with her medicines. Will taper cardene drip and start home medicines.Repeat Ct head today or MRI whichever can be done. This patient is critically ill and at significant risk of neurological worsening, death and care requires constant monitoring of vital signs, hemodynamics,respiratory and cardiac monitoring,review of multiple databases, neurological assessment, discussion with family, other specialists and medical decision making of high complexity.I have made any additions or clarifications directly to the above note.  I spent 30 minutes of neurocritical care time  in the care of  this patient.  Antony Contras, MD Medical Director [facility] Pager: [phone] 09/29/2014 9:50 AM      To contact Stroke Continuity provider, please refer to [email or web address] After hours, contact General Neurology

## 2014-09-29 NOTE — Plan of Care (Signed)
Problem: Acute Treatment Outcomes Goal: BP within ordered parameters Outcome: Progressing Pt is within BP parameters with Cardene drip on board.  Goal: 02 Sats > 94% Outcome: Completed/Met Date Met:  09/29/14 Pt has been oxygenating > 94% for the entire shift and since admission.   Problem: Progression Outcomes Goal: Bowel & Bladder Continence Outcome: Completed/Met Date Met:  09/29/14 Pt is continent of bladder and bowel. Goal: Educational plan initiated Outcome: Completed/Met Date Met:  09/29/14 Education plan initiated.

## 2014-09-29 NOTE — ED Provider Notes (Signed)
I saw and evaluated the patient, reviewed the resident's note and I agree with the findings and plan.   EKG Interpretation   Date/Time:  Thursday September 28 2014 22:15:42 EST Ventricular Rate:  100 PR Interval:  175 QRS Duration: 87 QT Interval:  383 QTC Calculation: 494 R Axis:   -16 Text Interpretation:  Sinus tachycardia Biatrial enlargement Borderline  left axis deviation Anteroseptal infarct, old borderline  ST elevation  Confirmed by Hazle Coca [phone]) on 09/28/2014 10:23:08 PM      CRITICAL CARE Performed by: Quintella Reichert   Total critical care time: 30  Critical care time was exclusive of separately billable procedures and treating other patients.  Critical care was necessary to treat or prevent imminent or life-threatening deterioration.  Critical care was time spent personally by me on the following activities: development of treatment plan with patient and/or surrogate as well as nursing, discussions with consultants, evaluation of patient's response to treatment, examination of patient, obtaining history from patient or surrogate, ordering and performing treatments and interventions, ordering and review of laboratory studies, ordering and review of radiographic studies, pulse oximetry and re-evaluation of patient's condition.  Pt presented to ED as code stroke.  Pt with tinnitus, speech difficulties, markedly hypertensive.  CT head with ICH.  Pt started on cardene gtt for BP mgmt with plan to admit to neuro ICU.  Pt also with acute renal failure.  EKG with borderline ST elevation - no CP, no STEMI.     Quintella Reichert, MD 09/30/14 [phone]

## 2014-09-29 NOTE — Progress Notes (Signed)
Rehab Admissions Coordinator Note:  Patient was screened by Retta Diones for appropriateness for an Inpatient Acute Rehab Consult.  At this time, we are recommending Inpatient Rehab consult.  Retta Diones 09/29/2014, 12:57 PM  I can be reached at [phone].

## 2014-09-29 NOTE — Evaluation (Signed)
Physical Therapy Evaluation Patient Details Name: [name] [name] MRN: [id number] DOB: 06-Jun-1961 Today's Date: 09/29/2014   History of Present Illness  Patient is a 53 y/o female admitted with left side weakness and gaze preference with BP 238/88.  CT showed acute pontine hemorrhagic infarct.  PMH positive for hyperlipidemia, HTN and compliance issues.  Clinical Impression  Patient presents with decreased independence with mobility due to left side weakness and incoordination.  She will benefit from skilled PT in the acute setting and follow up CIR to maximize independence and allow return home with family assist.    Follow Up Recommendations CIR    Equipment Recommendations  Rolling walker with 5" wheels (may change depending on progress)    Recommendations for Other Services Rehab consult     Precautions / Restrictions Precautions Precautions: Fall Restrictions Weight Bearing Restrictions: No      Mobility  Bed Mobility Overal bed mobility: Needs Assistance Bed Mobility: Supine to Sit     Supine to sit: Min assist     General bed mobility comments: assist to lift trunk, patient moves feet off bed  Transfers Overall transfer level: Needs assistance Equipment used: Rolling walker (2 wheeled) Transfers: Sit to/from Stand Sit to Stand: Min assist         General transfer comment: cues and lifting assist   Ambulation/Gait Ambulation/Gait assistance: Mod assist Ambulation Distance (Feet): 12 Feet Assistive device: Rolling walker (2 wheeled) Gait Pattern/deviations: Step-to pattern;Ataxic;Scissoring     General Gait Details: left LE moves in synergy with difficulty with initiating swing and makes contact with left lateral heel and rolls over on lateral aspect of foot  Stairs            Wheelchair Mobility    Modified Rankin (Stroke Patients Only) Modified Rankin (Stroke Patients Only) Pre-Morbid Rankin Score: No symptoms Modified Rankin: Moderately  severe disability     Balance Overall balance assessment: Needs assistance Sitting-balance support: Feet unsupported;Bilateral upper extremity supported Sitting balance-Leahy Scale: Fair Sitting balance - Comments: leans right  Postural control: Right lateral lean Standing balance support: Bilateral upper extremity supported Standing balance-Leahy Scale: Poor Standing balance comment: weight shifted right and with initial difficulty loading left LE                             Pertinent Vitals/Pain Pain Assessment: No/denies pain    Home Living Family/patient expects to be discharged to:: Private residence Living Arrangements: Children Available Help at Discharge: Family;Available 24 hours/day Type of Home: House Home Access: Stairs to enter Entrance Stairs-Rails: Right Entrance Stairs-Number of Steps: 4 Home Layout: Two level;Able to live on main level with bedroom/bathroom Home Equipment: Walker - standard (belonged to her aunt)      Prior Function Level of Independence: Independent         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    Extremity/Trunk Assessment   Upper Extremity Assessment: LUE deficits/detail       LUE Deficits / Details: AROM WFL, strength shoulder flexion 4/5, elbow flexion 4/5, extension   Lower Extremity Assessment: LLE deficits/detail   LLE Deficits / Details: AROM WFL except ankle DF limited due to strength/coordination, strength Hip flexion 4-/5, knee extension 4/5, knee flexion 3+/5, ankle DF 2-/5  Cervical / Trunk Assessment: Other exceptions  Communication   Communication: No difficulties  Cognition Arousal/Alertness: Awake/alert Behavior During Therapy: WFL for tasks assessed/performed  Overall Cognitive Status: Within Functional Limits for tasks assessed                      General Comments General comments (skin integrity, edema, etc.): peripheral vision seems  intact; wears glasses normally    Exercises Other Exercises Other Exercises: encouraged pt to practice small graded movements of left LE on foot rest on chair for practice with coordination and movement out of synergy      Assessment/Plan    PT Assessment Patient needs continued PT services  PT Diagnosis Abnormality of gait;Hemiplegia non-dominant side   PT Problem List Decreased strength;Decreased balance;Decreased mobility;Decreased coordination;Decreased knowledge of use of DME;Decreased safety awareness;Impaired tone  PT Treatment Interventions DME instruction;Gait training;Balance training;Neuromuscular re-education;Stair training;Functional mobility training;Patient/family education;Therapeutic activities;Therapeutic exercise   PT Goals (Current goals can be found in the Care Plan section) Acute Rehab PT Goals Patient Stated Goal: To return to independent PT Goal Formulation: With patient Time For Goal Achievement: 10/13/14 Potential to Achieve Goals: Good    Frequency Min 4X/week   Barriers to discharge        Co-evaluation               End of Session Equipment Utilized During Treatment: Gait belt Activity Tolerance: Patient tolerated treatment well Patient left: in chair;with call bell/phone within reach Nurse Communication: Mobility status         Time: 0940-1009 PT Time Calculation (min) (ACUTE ONLY): 29 min   Charges:   PT Evaluation $Initial PT Evaluation Tier I: 1 Procedure PT Treatments $Gait Training: 8-22 mins   PT G Codes:          [name],[name] October 22, 2014, 10:38 AM Magda Kiel, [place] 2014/10/22

## 2014-09-29 NOTE — Evaluation (Signed)
Speech Language Pathology Evaluation Patient Details Name: [name] [name] MRN: [id number] DOB: Sep 14, 1961 Today's Date: 09/29/2014 Time: 8413-2440 SLP Time Calculation (min) (ACUTE ONLY): 20 min  Problem List:  Patient Active Problem List   Diagnosis Date Noted  . Malignant hypertension   . Stroke 09/28/2014  . 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 Hypertensive emergency 09/28/2014  . ANEMIA, IRON DEFICIENCY 02/18/2008  . 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 ADRENAL INSUFFICIENCY, HX OF 02/18/2008  . DIABETES MELLITUS, TYPE II 10/25/2007  . HYPERLIPIDEMIA 10/25/2007  . UNSPECIFIED ULCERATION OF VULVA 10/25/2007  . SYSTEMIC LUPUS ERYTHEMATOSUS 10/25/2007  . RHEUMATOID ARTHRITIS 10/25/2007  . HYPERTENSION NEC 10/25/2007  . COLONIC POLYPS, HYPERPLASTIC 04/08/2006  . HEMORRHOIDS 04/08/2006   Past Medical History:  Past Medical History  Diagnosis Date  . Hypertension    Past Surgical History: History reviewed. No pertinent past surgical history. HPI:  Patient is a 53 y/o female admitted with left side weakness and gaze preference with BP 238/88.  CT showed acute pontine hemorrhagic infarct.  PMH positive for hyperlipidemia, HTN and compliance issues.    Assessment / Plan / Recommendation Clinical Impression  Patient presents with high level cognitive impairements impacting short term recall of information and high level problem solving/reasoning, particularly in the area of mathmatics. Verbal cueing for repetition of information assists in facilitating functional short term recall of auditory information provided following a 1-2 minute delay. Education complete with patient regarding evaluation findings. Patient acknowledges deficits and agreeable to rehab. Recommend CIR prior to d/c.     SLP Assessment  Patient needs continued Speech Lanaguage Pathology Services    Follow Up Recommendations  Inpatient Rehab    Frequency and Duration min 2x/week  2  weeks   Pertinent Vitals/Pain Pain Assessment: No/denies pain   SLP Goals  Potential to Achieve Goals (ACUTE ONLY): Good  SLP Evaluation Prior Functioning  Cognitive/Linguistic Baseline: Within functional limits Type of Home: House Available Help at Discharge: Family;Available 24 hours/day Vocation: Full time employment   Cognition  Overall Cognitive Status: Impaired/Different from baseline Arousal/Alertness: Awake/alert Orientation Level: Oriented X4 Attention: Selective Selective Attention: Appears intact Memory: Impaired Memory Impairment: Retrieval deficit;Decreased short term memory Decreased Short Term Memory: Verbal basic Awareness: Appears intact Problem Solving: Impaired Problem Solving Impairment: Verbal complex Executive Function: 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, [employer]) Reasoning: Impaired Reasoning Impairment: Verbal complex Safety/Judgment: Appears intact    Comprehension  Auditory Comprehension Overall Auditory Comprehension: Appears within functional limits for tasks assessed Interfering Components: Hearing Visual Recognition/Discrimination Discrimination: Within Function Limits Reading Comprehension Reading Status: Within funtional limits    Expression Expression Primary Mode of Expression: Verbal Verbal Expression Overall Verbal Expression: Appears within functional limits for tasks assessed Written Expression Dominant Hand: Right   Oral / Motor Oral Motor/Sensory Function Overall Oral Motor/Sensory Function: Appears within functional limits for tasks assessed (except left eye twitching, droop at baseline) Motor Speech Overall Motor Speech: Appears within functional limits for tasks assessed   GO    [name] Rainwater MA, [name] [phone]  [name] [name] [name] [name] 09/29/2014, 11:10 AM

## 2014-09-29 NOTE — Progress Notes (Signed)
[facility] Progress Note Patient Name: [name] [name] DOB: Jul 30, 1961 MRN: [id number]   Date of Service  09/29/2014  HPI/Events of Note  Admitted with HTN urgency, acute ICH with neuro deficits. Currently stable  eICU Interventions       Intervention Category Evaluation Type: New Patient Evaluation  [name] S. 09/29/2014, 12:06 AM

## 2014-09-30 LAB — CBC
HEMATOCRIT: 29.6 % — AB (ref 36.0–46.0)
HEMOGLOBIN: 9.5 g/dL — AB (ref 12.0–15.0)
MCH: 26.9 pg (ref 26.0–34.0)
MCHC: 32.1 g/dL (ref 30.0–36.0)
MCV: 83.9 fL (ref 78.0–100.0)
Platelets: 165 10*3/uL (ref 150–400)
RBC: 3.53 MIL/uL — AB (ref 3.87–5.11)
RDW: 14.5 % (ref 11.5–15.5)
WBC: 10 10*3/uL (ref 4.0–10.5)

## 2014-09-30 LAB — LIPID PANEL
CHOL/HDL RATIO: 5.3 ratio
CHOLESTEROL: 163 mg/dL (ref 0–200)
HDL: 31 mg/dL — AB (ref >39)
LDL CALC: 109 mg/dL — AB (ref 0–99)
Triglycerides: 117 mg/dL (ref <150)
VLDL: 23 mg/dL (ref 0–40)

## 2014-09-30 LAB — BASIC METABOLIC PANEL
Anion gap: 13 (ref 5–15)
BUN: 33 mg/dL — AB (ref 6–23)
CO2: 12 meq/L — AB (ref 19–32)
Calcium: 9 mg/dL (ref 8.4–10.5)
Chloride: 113 mEq/L — ABNORMAL HIGH (ref 96–112)
Creatinine, Ser: 2.76 mg/dL — ABNORMAL HIGH (ref 0.50–1.10)
GFR calc Af Amer: 22 mL/min — ABNORMAL LOW (ref >90)
GFR, EST NON AFRICAN AMERICAN: 19 mL/min — AB (ref >90)
GLUCOSE: 117 mg/dL — AB (ref 70–99)
POTASSIUM: 4.9 meq/L (ref 3.7–5.3)
SODIUM: 138 meq/L (ref 137–147)

## 2014-09-30 LAB — HEMOGLOBIN A1C
Hgb A1c MFr Bld: 4.8 % (ref <5.7)
Mean Plasma Glucose: 91 mg/dL (ref <117)

## 2014-09-30 MED ORDER — PANTOPRAZOLE SODIUM 40 MG PO TBEC
40.0000 mg | DELAYED_RELEASE_TABLET | Freq: Every day | ORAL | Status: DC
  Administered 2014-09-30 – 2014-10-01 (×2): 40 mg via ORAL
  Filled 2014-09-30 (×2): qty 1

## 2014-09-30 MED ORDER — ATORVASTATIN CALCIUM 20 MG PO TABS
20.0000 mg | ORAL_TABLET | Freq: Every day | ORAL | Status: DC
  Administered 2014-09-30 – 2014-10-04 (×4): 20 mg via ORAL
  Filled 2014-09-30 (×6): qty 1

## 2014-09-30 NOTE — Plan of Care (Signed)
Problem: Consults Goal: Skin Care Protocol Initiated - if Braden Score 18 or less If consults are not indicated, leave blank or document N/A  Outcome: Completed/Met Date Met:  09/30/14 Goal: Diabetes Guidelines if Diabetic/Glucose > 140 If diabetic or lab glucose is > 140 mg/dl - Initiate Diabetes/Hyperglycemia Guidelines & Document Interventions  Outcome: Not Applicable Date Met:  68/61/68  Problem: Acute Treatment Outcomes Goal: Neuro exam at baseline or improved Outcome: Completed/Met Date Met:  09/30/14 Goal: Airway maintained/protected Outcome: Completed/Met Date Met:  09/30/14

## 2014-09-30 NOTE — Progress Notes (Addendum)
STROKE TEAM PROGRESS NOTE   HISTORY [name] [name] is a 53 y.o. female history of hypertension, hyperlipidemia as well as compliance issues with treatment, presenting with acute onset left sided weakness and numbness as well as gaze to the right side. She has no previous history of stroke nor TIA. She ran out of blood pressure medicine about 3 weeks ago. Blood pressure in the emergency room was 238/88. She was initially given 20 mg of labetalol and subsequently started on Cardene drip. 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 NIH stroke score was 7.  LSN: 8:55 PM on 09/28/2014 tPA Given: No: Acute pontine hemorrhage   SUBJECTIVE (INTERVAL HISTORY): She reports that she is feeling "better today". CIR has been recommended. EKG was done overnight due to patient's complaints of SOB, chest tightness, anti-anxiety medication was given. Per family, patient has a PMHx of Lupus as well as baseline left leg weakness and drags the left leg when she is tired.     OBJECTIVE Temp:  [97.7 F (36.5 C)-98.5 F (36.9 C)] 98 F (36.7 C) (11/28 0757) Pulse Rate:  [68-105] 80 (11/28 0715) Cardiac Rhythm:  [-] Normal sinus rhythm (11/28 0400) Resp:  [14-38] 19 (11/28 0715) BP: (125-192)/(47-149) 157/50 mmHg (11/28 0715) SpO2:  [96 %-100 %] 98 % (11/28 0715)   Recent Labs Lab 09/28/14 2229  GLUCAP 106*    Recent Labs Lab 09/28/14 2153 09/28/14 2202 09/28/14 2340  NA 141 144 140  K 4.4 4.1 4.2  CL 108 114* 109  CO2 16*  --  17*  GLUCOSE 90 90 138*  BUN 38* 39* 39*  CREATININE 3.20* 3.50* 3.17*  CALCIUM 9.3  --  9.0    Recent Labs Lab 09/28/14 2153 09/28/14 2340  AST 27 21  ALT 20 17  ALKPHOS 89 83  BILITOT <0.2* <0.2*  PROT 7.8 7.2  ALBUMIN 2.8* 2.6*    Recent Labs Lab 09/28/14 2153 09/28/14 2202 09/28/14 2340  WBC  --   --  5.9  NEUTROABS 3.6  --  5.0  HGB  --  11.6* 9.7*  HCT  --  34.0* 30.6*  MCV  --   --  83.4  PLT  --   --  187   No results  for input(s): CKTOTAL, CKMB, CKMBINDEX, TROPONINI in the last 168 hours.  Recent Labs  09/28/14 2340  LABPROT 13.9  INR 1.06    Recent Labs  09/28/14 2239  COLORURINE YELLOW  LABSPEC 1.016  PHURINE 6.5  GLUCOSEU NEGATIVE  HGBUR SMALL*  BILIRUBINUR NEGATIVE  KETONESUR NEGATIVE  PROTEINUR >300*  UROBILINOGEN 0.2  NITRITE NEGATIVE  LEUKOCYTESUR NEGATIVE       Component Value Date/Time   CHOL * 04/06/2009 1452    207        ATP III CLASSIFICATION:  <200     mg/dL   Desirable  200-239  mg/dL   Borderline High  >=240    mg/dL   High          TRIG 184* 04/06/2009 1452   HDL 24* 04/06/2009 1452   CHOLHDL 8.6 04/06/2009 1452   VLDL 37 04/06/2009 1452   LDLCALC * 04/06/2009 1452    146        Total Cholesterol/HDL:CHD Risk Coronary Heart Disease Risk Table                     Men   Women  1/2 Average Risk   3.4  3.3  Average Risk       5.0   4.4  2 X Average Risk   9.6   7.1  3 X Average Risk  23.4   11.0        Use the calculated Patient Ratio above and the CHD Risk Table to determine the patient's CHD Risk.        ATP III CLASSIFICATION (LDL):  <100     mg/dL   Optimal  100-129  mg/dL   Near or Above                    Optimal  130-159  mg/dL   Borderline  160-189  mg/dL   High  >190     mg/dL   Very High   Lab Results  Component Value Date   HGBA1C 4.4 03/01/2012      Component Value Date/Time   LABOPIA NONE DETECTED 09/28/2014 2239   COCAINSCRNUR NONE DETECTED 09/28/2014 2239   LABBENZ NONE DETECTED 09/28/2014 2239   AMPHETMU NONE DETECTED 09/28/2014 2239   THCU NONE DETECTED 09/28/2014 2239   LABBARB NONE DETECTED 09/28/2014 2239     Recent Labs Lab 09/28/14 2340  ETH <11    Ct Head (brain) Wo Contrast 09/28/2014    1. 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  2. Chronic microvascular disease     Chest Port 1 View 09/29/2014    There is no active cardiopulmonary disease. A PA and lateral chest x-ray would be useful when  the patient can tolerate the procedure.   Electronically Signed   By: David  Martinique   On: 09/29/2014 07:42    MRI of the Brain Pending   CT of the head without contrast Pending   PHYSICAL EXAM [name] not in distress.Awake alert. Afebrile. Head is nontraumatic. Neck is supple without bruit. Hearing is normal. Cardiac exam RRR, no murmur or gallop. Lungs are clear to auscultation.   Neurological Exam : Awake alert oriented x 3 normal speech and language. Visual fields full. EOMI. Pupils equal and reactive. Mild right lower face asymmetry and numbness, mild NL flattening. Tongue midline. No drift. Mild diminished fine finger movements on right . Mild right-sided weakness right arm 4/5,  Right leg 3+/5, mild right grip weak. Diminished right hemibody touch/pinprick sensation. Mild right-sided dysmetria. Toes downgoing.     ASSESSMENT/PLAN [name] [name] is a 53 y.o. female with history of hypertension, diabetes history, lupus, tobacco use, hyperlipidemia, and medical noncompliance,  presenting with blood pressure 238/88, right sided numbness and weakness, with left gaze preference. 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  Resultant - right hemiparesis and right hemisensory deficits.  MRI  pending  MRA  not ordered  Carotid Doppler pending  2D Echo not ordered  LDL 146  HgbA1c pending  SCDs for VTE prophylaxis  Diet Heart   aspirin 81 mg orally every day prior to admission, now on no antithrombotics secondary to hemorrhage.  Ongoing aggressive stroke risk factor management  Therapy recommendations:  CIR  Disposition:  Pending  Hypertension  Home meds:  Norvasc 10 mg daily, Lasix 20 mg daily, labetalol 400 mg twice a day, lisinopril 20 mg daily, spironolactone 25 mg daily,  and reserpine 0.1 mg daily. Reportedly ran out of blood pressure medications 3 weeks prior to admission.  Stable - on Cardene drip.Home medicines resumed today. Goal normotensive, still running high but just restarted home meds will review again tomorrow. Patient counseled to be compliant with her blood pressure medications on discharge  Hyperlipidemia  Home meds:  The patient was not on cholesterol lowering medications prior to admission.  LDL 146, goal < 70  Add Lipitor 20mg   Continue statin at discharge  Diabetes  Hemoglobin A1c - pending  Other Stroke Risk Factors  Cigarette smoker, advised to stop smoking  Medical noncompliance   Other Active Problems  Anemia  Kidney disease  Other Pertinent History  Lupus - on immunosuppression.   Mikey Bussing PA-C [facility] Pager [phone] 09/30/2014, 8:07 AM   Hospital day # 2   Personally examined patient and images, agree with history, physical, neuro exam as stated above. Agree with assessment and plan.   Sarina Ill, MD [facility]        To contact Stroke Continuity provider, please refer to [email or web address] After hours, contact General Neurology

## 2014-09-30 NOTE — Progress Notes (Signed)
Pt states that she is having chest pain but then denies that it is actual pain when asked to rate it on a scale of 0-10. She then describes it more as tightness, shortness of breath and difficulty taking breaths. Pt is oxygenating on room air at >98% at all times. Pt confirmed that she does take an anti-anxiety medication but cannot remember when she took it last or what it is called.   Called Dr. Nicole Kindred to report symptoms. EKG being done at this time.   MD gave order for anti-anxiety medication. See MAR.   Rosalynn Sergent GARNER

## 2014-09-30 NOTE — Progress Notes (Signed)
Two attempts made to complete the carotid duplex. In the chair once and asleep the second. RN request the study be done tomorror due to the fact that it had been a very difficult day with the patient. The patient needed to sleep.  [facility], [place] 09/30/2014 12:56 PM

## 2014-09-30 NOTE — Progress Notes (Signed)
Physical Therapy Treatment Patient Details Name: [name] [name] MRN: [id number] DOB: [birth year] Today's Date: 09/30/2014    History of Present Illness Patient is a 53 y/o female admitted with left side weakness and gaze preference with BP 238/88.  CT showed acute pontine hemorrhagic infarct.  PMH positive for hyperlipidemia, HTN and compliance issues.    PT Comments    Pt progressing with transfers and gait and not demonstrating gaze preference today. Pt with cues for sequence with transfers, gait and safety. Pt fatigued end of session and RN aware of mobility. Pt motivated to return to independent level.   Follow Up Recommendations  CIR     Equipment Recommendations       Recommendations for Other Services       Precautions / Restrictions Precautions Precautions: Fall Restrictions Weight Bearing Restrictions: No    Mobility  Bed Mobility Overal bed mobility: Needs Assistance Bed Mobility: Supine to Sit     Supine to sit: Min assist     General bed mobility comments: cues for sequence with min assist to elevate trunk  Transfers Overall transfer level: Needs assistance   Transfers: Sit to/from Stand;Stand Pivot Transfers Sit to Stand: Min assist Stand pivot transfers: Min assist       General transfer comment: cues for hand placement and sequence. Increased cueing and assist with pivot left due to decreased control of LLE with knee extension and increased hip flexion  Ambulation/Gait Ambulation/Gait assistance: Min assist Ambulation Distance (Feet): 60 Feet Assistive device: Rolling walker (2 wheeled) Gait Pattern/deviations: Step-to pattern;Decreased stance time - left;Narrow base of support   Gait velocity interpretation: Below normal speed for age/gender General Gait Details: Pt with narrow BOS without scissoring and maintaining left foot flat today with decreased knee flexion and short stride length with cues for sequence and RW use   Stairs             Wheelchair Mobility    Modified Rankin (Stroke Patients Only) Modified Rankin (Stroke Patients Only) Pre-Morbid Rankin Score: No symptoms Modified Rankin: Moderately severe disability     Balance Overall balance assessment: Needs assistance   Sitting balance-Leahy Scale: Fair       Standing balance-Leahy Scale: Poor                      Cognition Arousal/Alertness: Awake/alert Behavior During Therapy: Flat affect Overall Cognitive Status: Impaired/Different from baseline Area of Impairment: Orientation;Safety/judgement Orientation Level: Time       Safety/Judgement: Decreased awareness of deficits          Exercises General Exercises - Lower Extremity Long Arc Quad: AROM;Seated;Left;20 reps Hip Flexion/Marching: AROM;Seated;Left;20 reps    General Comments        Pertinent Vitals/Pain Pain Assessment: 0-10 Pain Score: 4  Pain Location: neck and back Pain Descriptors / Indicators: Aching Pain Intervention(s): Repositioned  176/66 HR 91 sats 100%    Home Living                      Prior Function            PT Goals (current goals can now be found in the care plan section) Progress towards PT goals: Progressing toward goals    Frequency       PT Plan Current plan remains appropriate    Co-evaluation             End of Session Equipment Utilized During Treatment: Gait belt Activity Tolerance: Patient  tolerated treatment well Patient left: in chair;with call bell/phone within reach     Time: 0945-1013 PT Time Calculation (min) (ACUTE ONLY): 28 min  Charges:  $Gait Training: 8-22 mins $Therapeutic Activity: 8-22 mins                    G Codes:      Melford Aase 10-18-2014, 10:20 AM Elwyn Reach, [place]

## 2014-09-30 NOTE — ED Provider Notes (Signed)
CSN: [id number]     Arrival date & time 09/28/14  2149 History   First MD Initiated Contact with Patient 09/28/14 2151     Chief Complaint  Patient presents with  . Code Stroke     (Consider location/radiation/quality/duration/timing/severity/associated sxs/prior Treatment) Patient is a 53 y.o. female presenting with neurologic complaint. The history is provided by the patient and the EMS personnel.  Neurologic Problem This is a new problem. The current episode started today. The problem occurs constantly. The problem has been unchanged. Associated symptoms include numbness and weakness. Pertinent negatives include no abdominal pain, chest pain, congestion, coughing, fever, headaches or joint swelling. Nothing aggravates the symptoms. She has tried nothing for the symptoms.    Past Medical History  Diagnosis Date  . Hypertension    History reviewed. No pertinent past surgical history. Family History  Problem Relation Age of Onset  . Hypertension    . Lupus    . Rheum arthritis    . Hypertension Mother   . Diabetes Mother   . Hypertension Sister   . Diabetes Father   . Hypertension Maternal Grandmother    History  Substance Use Topics  . Smoking status: Current Every Day Smoker -- 0.50 packs/day for 30 years    Types: Cigarettes  . Smokeless tobacco: Not on file  . Alcohol Use: No   OB History    No data available     Review of Systems  Constitutional: Negative for fever and activity change.  HENT: Negative for congestion and facial swelling.   Eyes: Negative for discharge and redness.  Respiratory: Negative for cough and shortness of breath.   Cardiovascular: Negative for chest pain and palpitations.  Gastrointestinal: Negative for abdominal pain and abdominal distention.  Endocrine: Negative for polydipsia and polyuria.  Genitourinary: Negative for dysuria and menstrual problem.  Musculoskeletal: Negative for back pain and joint swelling.  Skin: Negative for  color change and wound.  Neurological: Positive for weakness and numbness. Negative for dizziness, light-headedness and headaches.      Allergies  Sulfa antibiotics  Home Medications   Prior to Admission medications   Medication Sig Start Date End Date Taking? Authorizing Provider  amLODipine (NORVASC) 10 MG tablet take 1 tablet by mouth once daily 12/04/11  Yes Dayna N Dunn, PA-C  aspirin 81 MG tablet Take 81 mg by mouth daily.   Yes Historical Provider, MD  Cholecalciferol (VITAMIN D3) 50000 UNITS CAPS Take 1 capsule by mouth 3 (three) times a week. 08/13/12  Yes Robyn Haber, MD  ferrous sulfate 325 (65 FE) MG tablet Take 325 mg by mouth daily with breakfast.   Yes Historical Provider, MD  furosemide (LASIX) 20 MG tablet Take 20 mg by mouth 2 (two) times daily.   Yes Historical Provider, MD  labetalol (NORMODYNE) 200 MG tablet Take 200 mg by mouth 2 (two) times daily.    Yes Historical Provider, MD  lisinopril (PRINIVIL,ZESTRIL) 20 MG tablet Take 20 mg by mouth daily.   Yes Historical Provider, MD  multivitamin (RENA-VIT) TABS tablet Take 1 tablet by mouth daily.   Yes Historical Provider, MD  mycophenolate (CELLCEPT) 250 MG capsule Take 1,000 mg by mouth 2 (two) times daily.   Yes Historical Provider, MD  traMADol (ULTRAM) 50 MG tablet Take 50 mg by mouth 2 (two) times daily as needed for moderate pain.    Yes Historical Provider, MD  nicotine (NICODERM CQ - DOSED IN MG/24 HOURS) 14 mg/24hr patch Place 1 patch onto the skin  daily.    Historical Provider, MD  reserpine 0.1 MG TABS Take 0.1 mg by mouth daily.    Historical Provider, MD  spironolactone (ALDACTONE) 25 MG tablet Take 1 tablet (25 mg total) by mouth daily. Patient not taking: Reported on 09/28/2014 08/10/12   Robyn Haber, MD   BP 125/47 mmHg  Pulse 73  Temp(Src) 98.2 F (36.8 C) (Oral)  Resp 31  Ht 5' 5.5" (1.664 m)  Wt 142 lb 6.7 oz (64.6 kg)  BMI 23.33 kg/m2  SpO2 96%  LMP 07/18/2012 Physical Exam   Constitutional: She is oriented to person, place, and time. She appears well-developed and well-nourished.  HENT:  Head: Normocephalic and atraumatic.  Eyes: Conjunctivae and EOM are normal. Right eye exhibits no discharge. Left eye exhibits no discharge.  Cardiovascular: Normal rate and regular rhythm.   Pulmonary/Chest: Effort normal and breath sounds normal. No respiratory distress.  Abdominal: Soft. She exhibits no distension. There is no tenderness. There is no rebound.  Musculoskeletal: Normal range of motion. She exhibits no edema or tenderness.  Neurological: She is alert and oriented to person, place, and time.  Skin: Skin is warm and dry.  Nursing note and vitals reviewed.   ED Course  Procedures (including critical care time) Labs Review Labs Reviewed  COMPREHENSIVE METABOLIC PANEL - Abnormal; Notable for the following:    CO2 16 (*)    BUN 38 (*)    Creatinine, Ser 3.20 (*)    Albumin 2.8 (*)    Total Bilirubin <0.2 (*)    GFR calc non Af Amer 16 (*)    GFR calc Af Amer 18 (*)    Anion gap 17 (*)    All other components within normal limits  URINALYSIS, ROUTINE W REFLEX MICROSCOPIC - Abnormal; Notable for the following:    Hgb urine dipstick SMALL (*)    Protein, ur >300 (*)    All other components within normal limits  APTT - Abnormal; Notable for the following:    aPTT 48 (*)    All other components within normal limits  CBC - Abnormal; Notable for the following:    RBC 3.67 (*)    Hemoglobin 9.7 (*)    HCT 30.6 (*)    All other components within normal limits  DIFFERENTIAL - Abnormal; Notable for the following:    Neutrophils Relative % 85 (*)    Lymphocytes Relative 10 (*)    Lymphs Abs 0.6 (*)    All other components within normal limits  COMPREHENSIVE METABOLIC PANEL - Abnormal; Notable for the following:    CO2 17 (*)    Glucose, Bld 138 (*)    BUN 39 (*)    Creatinine, Ser 3.17 (*)    Albumin 2.6 (*)    Total Bilirubin <0.2 (*)    GFR calc non  Af Amer 16 (*)    GFR calc Af Amer 18 (*)    All other components within normal limits  URINE MICROSCOPIC-ADD ON - Abnormal; Notable for the following:    Squamous Epithelial / LPF FEW (*)    All other components within normal limits  CBG MONITORING, ED - Abnormal; Notable for the following:    Glucose-Capillary 106 (*)    All other components within normal limits  I-STAT CHEM 8, ED - Abnormal; Notable for the following:    Chloride 114 (*)    BUN 39 (*)    Creatinine, Ser 3.50 (*)    Hemoglobin 11.6 (*)  HCT 34.0 (*)    All other components within normal limits  MRSA PCR SCREENING  DIFFERENTIAL  ETHANOL  URINE RAPID DRUG SCREEN (HOSP PERFORMED)  PROTIME-INR  I-STAT TROPOININ, ED  I-STAT TROPOININ, ED    Imaging Review Ct Head (brain) Wo Contrast  09/28/2014   CLINICAL DATA:  Code stroke. Ran out of hypertensive Medicine several days ago.  EXAM: CT HEAD WITHOUT CONTRAST  TECHNIQUE: Contiguous axial images were obtained from the base of the skull through the vertex without intravenous contrast.  COMPARISON:  None.  FINDINGS: There is mild low attenuation within the subcortical white matter of the right frontoparietal lobe compatible with chronic microvascular disease. Within the pons there is a 1.1 cm hyper dense mass, image number 8/ series 2. This is consistent with pontine hemorrhage. No additional areas of hemorrhage identified. The ventricular volumes and sulci appear within normal limits. Paranasal sinuses and mastoid air cells are clear. The skull appears intact.  IMPRESSION: 1. 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 2. Chronic microvascular disease 3. Critical Value/emergent results were called by telephone at the time of interpretation on 09/28/2014 at 10:12 pm to Dr. Wallie Char, who verbally acknowledged these results.   Electronically Signed   By: Kerby Moors M.D.   On: 09/28/2014 22:13   Chest Port 1 View  09/29/2014   CLINICAL DATA:  Stroke  symptoms, shortness of breath  EXAM: PORTABLE CHEST - 1 VIEW  COMPARISON:  PA and lateral chest x-ray of January 07, 2011  FINDINGS: The lungs are adequately inflated. The left hemidiaphragm is better demonstrated today than in the past. The cardiac silhouette is top-normal in size. The pulmonary vascularity is not engorged. The mediastinum is normal in width. The trachea is midline.  IMPRESSION: There is no active cardiopulmonary disease. A PA and lateral chest x-ray would be useful when the patient can tolerate the procedure.   Electronically Signed   By: David  Martinique   On: 09/29/2014 07:42     EKG Interpretation   Date/Time:  Thursday September 28 2014 22:15:42 EST Ventricular Rate:  100 PR Interval:  175 QRS Duration: 87 QT Interval:  383 QTC Calculation: 494 R Axis:   -16 Text Interpretation:  Sinus tachycardia Biatrial enlargement Borderline  left axis deviation Anteroseptal infarct, old borderline  ST elevation  Confirmed by Hazle Coca [phone]) on 09/28/2014 10:23:08 PM      MDM   Final diagnoses:  Stroke    53 yo F here with new onset right sided numbness, hypertensive on arrival. Straight to ct scan found to have pontine hemorrhage, labetalol and subsequently cardine for BP control. Admitted to icu w/ neuro for further control.     Merrily Pew, MD 09/30/14 8527  Quintella Reichert, MD 09/30/14 [phone]

## 2014-10-01 MED ORDER — LABETALOL HCL 200 MG PO TABS
400.0000 mg | ORAL_TABLET | Freq: Two times a day (BID) | ORAL | Status: DC
  Administered 2014-10-01 – 2014-10-05 (×8): 400 mg via ORAL
  Filled 2014-10-01 (×11): qty 2

## 2014-10-01 MED ORDER — OXYCODONE-ACETAMINOPHEN 5-325 MG PO TABS
1.0000 | ORAL_TABLET | Freq: Four times a day (QID) | ORAL | Status: DC | PRN
  Administered 2014-10-01 – 2014-10-05 (×6): 1 via ORAL
  Filled 2014-10-01 (×6): qty 1

## 2014-10-01 NOTE — Progress Notes (Signed)
STROKE TEAM PROGRESS NOTE   HISTORY [name] [name] is a 53 y.o. female history of hypertension, hyperlipidemia as well as compliance issues with treatment, presenting with acute onset left sided weakness and numbness as well as gaze to the right side. She has no previous history of stroke nor TIA. She ran out of blood pressure medicine about 3 weeks ago. Blood pressure in the emergency room was 238/88. She was initially given 20 mg of labetalol and subsequently started on Cardene drip. 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 NIH stroke score was 7.  LSN: 8:55 PM on 09/28/2014 tPA Given: No: Acute pontine hemorrhage   SUBJECTIVE (INTERVAL HISTORY):  Patient is feeling better today. She has some muscular neck pain. No issues overnight. Unable to do carotids, they will try again in the morning. She is still on the Cardene drip due to elevated blood pressures. Her oral medications were started yesterday. Will increase her Labetalol today to try and wean off the Cardene drip. CIR has been recommended.    OBJECTIVE Temp:  [97.9 F (36.6 C)-99.7 F (37.6 C)] 97.9 F (36.6 C) (11/29 0316) Pulse Rate:  [83-104] 83 (11/29 0730) Cardiac Rhythm:  [-] Normal sinus rhythm (11/28 2000) Resp:  [14-34] 23 (11/29 0730) BP: (110-188)/(46-110) 150/58 mmHg (11/29 0730) SpO2:  [92 %-100 %] 94 % (11/29 0730)   Recent Labs Lab 09/28/14 2229  GLUCAP 106*    Recent Labs Lab 09/28/14 2153 09/28/14 2202 09/28/14 2340 09/30/14 1000  NA 141 144 140 138  K 4.4 4.1 4.2 4.9  CL 108 114* 109 113*  CO2 16*  --  17* 12*  GLUCOSE 90 90 138* 117*  BUN 38* 39* 39* 33*  CREATININE 3.20* 3.50* 3.17* 2.76*  CALCIUM 9.3  --  9.0 9.0    Recent Labs Lab 09/28/14 2153 09/28/14 2340  AST 27 21  ALT 20 17  ALKPHOS 89 83  BILITOT <0.2* <0.2*  PROT 7.8 7.2  ALBUMIN 2.8* 2.6*    Recent Labs Lab 09/28/14 2153 09/28/14 2202 09/28/14 2340 09/30/14 1000  WBC  --   --  5.9 10.0   NEUTROABS 3.6  --  5.0  --   HGB  --  11.6* 9.7* 9.5*  HCT  --  34.0* 30.6* 29.6*  MCV  --   --  83.4 83.9  PLT  --   --  187 165   No results for input(s): CKTOTAL, CKMB, CKMBINDEX, TROPONINI in the last 168 hours.  Recent Labs  09/28/14 2340  LABPROT 13.9  INR 1.06    Recent Labs  09/28/14 2239  COLORURINE YELLOW  LABSPEC 1.016  PHURINE 6.5  GLUCOSEU NEGATIVE  HGBUR SMALL*  BILIRUBINUR NEGATIVE  KETONESUR NEGATIVE  PROTEINUR >300*  UROBILINOGEN 0.2  NITRITE NEGATIVE  LEUKOCYTESUR NEGATIVE       Component Value Date/Time   CHOL 163 09/30/2014 1000   TRIG 117 09/30/2014 1000   HDL 31* 09/30/2014 1000   CHOLHDL 5.3 09/30/2014 1000   VLDL 23 09/30/2014 1000   LDLCALC 109* 09/30/2014 1000   Lab Results  Component Value Date   HGBA1C 4.8 09/30/2014      Component Value Date/Time   LABOPIA NONE DETECTED 09/28/2014 2239   COCAINSCRNUR NONE DETECTED 09/28/2014 2239   LABBENZ NONE DETECTED 09/28/2014 2239   AMPHETMU NONE DETECTED 09/28/2014 2239   THCU NONE DETECTED 09/28/2014 2239   LABBARB NONE DETECTED 09/28/2014 2239     Recent Labs Lab 09/28/14 2340  ETH <11    Ct Head (brain) Wo Contrast 09/28/2014    1. 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  2. Chronic microvascular disease     Chest Port 1 View 09/29/2014    There is no active cardiopulmonary disease. A PA and lateral chest x-ray would be useful when the patient can tolerate the procedure.   Electronically Signed   By: David  Martinique   On: 09/29/2014 07:42    MRI of the Brain Pending   CT of the head without contrast Pending   PHYSICAL EXAM [name] not in distress.Awake alert. Afebrile. Head is nontraumatic. Neck is supple without bruit. Hearing is normal. Cardiac exam RRR, no murmur or gallop. Lungs are clear to auscultation.   Neurological Exam : no changes from previous exam Awake alert oriented x 3 normal speech and language.  Visual fields full. EOMI. Pupils equal and reactive. Mild right lower face asymmetry and numbness, NL flattening. Tongue midline. No drift. Mild diminished fine finger movements on right . Mild right-sided weakness right arm 4/5,  Right leg 3+/5, mild right grip weak. Diminished right hemibody touch/pinprick sensation. Mild right-sided dysmetria. Toes downgoing.     ASSESSMENT/PLAN [name] [name] is a 53 y.o. female with history of hypertension, diabetes history, lupus, tobacco use, hyperlipidemia, and medical noncompliance,  presenting with blood pressure 238/88, right sided numbness and weakness, with left gaze preference. 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  Resultant - right hemiparesis and right hemisensory deficits.  MRI  pending  MRA  not ordered  Carotid Doppler pending  2D Echo not ordered  LDL 146  HgbA1c - 4.8  SCDs for VTE prophylaxis  Diet Heart   aspirin 81 mg orally every day prior to admission, now on no antithrombotics secondary to hemorrhage.   Ongoing aggressive stroke risk factor management  Therapy recommendations:  CIR - rehabilitation M.D. consult placed  Disposition:  Pending  Hypertension  Home meds:  Norvasc 10 mg daily, Lasix 20 mg daily, labetalol 400 mg twice a day, lisinopril 20 mg daily, spironolactone 25 mg daily, and reserpine 0.1 mg daily. Reportedly ran out of blood pressure medications 3 weeks prior to admission.  Stable - on Cardene drip.Home medicines resumed today. Goal normotensive, still running high but just restarted home meds will review again tomorrow. Patient counseled to be compliant with her blood pressure medications on discharge  Hyperlipidemia  Home meds:  The patient was not on cholesterol lowering medications prior to admission.  LDL 146, goal <  70  Add Lipitor 20mg   Continue statin at discharge  Diabetes  Hemoglobin A1c - 4.8  Other Stroke Risk Factors  Cigarette smoker, advised to stop smoking  Medical noncompliance   Other Active Problems  Anemia - recheck labs Monday  Kidney disease  Hypertension - SBP in the 150s range (Cardene drip; Zestril; Norvasc; Labetalol; PRN Apresoline) Will increase Labetalol.  Other Pertinent History  Lupus - on immunosuppression.   Mikey Bussing PA-C [facility] Pager [phone] 10/01/2014, 8:12 AM   Hospital day # 3   Personally examined patient and images, agree with history, physical, neuro exam as stated above. Agree with assessment and plan.   Sarina Ill, MD [facility]          To contact Stroke Continuity provider, please refer to  [email or web address] After hours, contact General Neurology

## 2014-10-01 NOTE — Progress Notes (Signed)
VASCULAR LAB PRELIMINARY  PRELIMINARY  PRELIMINARY  PRELIMINARY  Carotid duplex completed.    Preliminary report:  Right:  40-59% internal carotid artery stenosis.  Left - 1% to 39% ICA stenosis upper end of scale. Bilateral - Vertebral artery flow is antegrade  Madalynn Pickelsimer, RVS 10/01/2014, 5:14 PM

## 2014-10-01 NOTE — Plan of Care (Signed)
Problem: Acute Treatment Outcomes Goal: Other Acute Treatment Outcomes Outcome: Not Applicable Date Met:  29/93/71  Problem: Progression Outcomes Goal: Tolerating diet/TF at goal rate Outcome: Progressing

## 2014-10-02 ENCOUNTER — Encounter (HOSPITAL_COMMUNITY): Payer: Self-pay | Admitting: Physical Medicine and Rehabilitation

## 2014-10-02 ENCOUNTER — Inpatient Hospital Stay (HOSPITAL_COMMUNITY): Payer: Managed Care, Other (non HMO)

## 2014-10-02 DIAGNOSIS — I619 Nontraumatic intracerebral hemorrhage, unspecified: Secondary | ICD-10-CM

## 2014-10-02 DIAGNOSIS — M25579 Pain in unspecified ankle and joints of unspecified foot: Secondary | ICD-10-CM

## 2014-10-02 DIAGNOSIS — G8929 Other chronic pain: Secondary | ICD-10-CM | POA: Diagnosis present

## 2014-10-02 DIAGNOSIS — I6350 Cerebral infarction due to unspecified occlusion or stenosis of unspecified cerebral artery: Secondary | ICD-10-CM

## 2014-10-02 LAB — BASIC METABOLIC PANEL
ANION GAP: 16 — AB (ref 5–15)
BUN: 33 mg/dL — ABNORMAL HIGH (ref 6–23)
CALCIUM: 8.3 mg/dL — AB (ref 8.4–10.5)
CHLORIDE: 111 meq/L (ref 96–112)
CO2: 11 mEq/L — ABNORMAL LOW (ref 19–32)
Creatinine, Ser: 2.8 mg/dL — ABNORMAL HIGH (ref 0.50–1.10)
GFR calc non Af Amer: 18 mL/min — ABNORMAL LOW (ref >90)
GFR, EST AFRICAN AMERICAN: 21 mL/min — AB (ref >90)
Glucose, Bld: 95 mg/dL (ref 70–99)
Potassium: 4.9 mEq/L (ref 3.7–5.3)
Sodium: 138 mEq/L (ref 137–147)

## 2014-10-02 LAB — CBC
HCT: 26.6 % — ABNORMAL LOW (ref 36.0–46.0)
Hemoglobin: 8.4 g/dL — ABNORMAL LOW (ref 12.0–15.0)
MCH: 26.2 pg (ref 26.0–34.0)
MCHC: 31.6 g/dL (ref 30.0–36.0)
MCV: 82.9 fL (ref 78.0–100.0)
Platelets: 163 10*3/uL (ref 150–400)
RBC: 3.21 MIL/uL — ABNORMAL LOW (ref 3.87–5.11)
RDW: 14.6 % (ref 11.5–15.5)
WBC: 12.5 10*3/uL — ABNORMAL HIGH (ref 4.0–10.5)

## 2014-10-02 MED ORDER — MENTHOL 3 MG MT LOZG
1.0000 | LOZENGE | OROMUCOSAL | Status: DC | PRN
  Administered 2014-10-02: 3 mg via ORAL
  Filled 2014-10-02 (×2): qty 9

## 2014-10-02 NOTE — Plan of Care (Signed)
Problem: Progression Outcomes Goal: Hemodynamically stable Outcome: Completed/Met Date Met:  10/02/14 Goal: Progressive activity as tolerated Outcome: Completed/Met Date Met:  10/02/14 Goal: Tolerating diet/TF at goal rate Outcome: Completed/Met Date Met:  10/02/14 Goal: Pain controlled Outcome: Completed/Met Date Met:  10/02/14 Goal: Other Progression Outcomes Outcome: Not Applicable Date Met:  84/03/75

## 2014-10-02 NOTE — Plan of Care (Signed)
Problem: Acute Treatment Outcomes Goal: Pain controlled Outcome: Progressing Pt was started on new medication for pain control. Pain in neck and ear has gone from 8 to 0 during this shift.   Problem: Progression Outcomes Goal: Progressive activity as tolerated Outcome: Not Progressing Pt somnolent today, remained in bed all day, did not work with PT/OT.

## 2014-10-02 NOTE — Clinical Social Work Note (Signed)
Clinical Social Worker received referral for possible ST-SNF placement.  Chart reviewed.  PT/OT recommending inpatient rehab placement.  Spoke with rehab admissions coordinator who will follow up with patient to discuss potential inpatient rehab stay.  Patient in agreement and insurance authorization has been initiated.  CSW signing off - please re consult if social work needs arise.  Barbette Or, [place]

## 2014-10-02 NOTE — Progress Notes (Signed)
Rehab admissions - Evaluated for possible admission.  I met with patient.  She is interested in inpatient rehab admission here in the hospital.  I will open the case with [employer] and request acute inpatient rehab admission.  Call me for questions.  #[phone]

## 2014-10-02 NOTE — Consult Note (Signed)
Physical Medicine and Rehabilitation Consult  Reason for Consult: Left sided weakness, high level cognitive deficits and left inattention.  Referring Physician:  Dr. Leonie Man.    HPI: [name] [name] is a 53 y.o. female with history of HTN, hyperlipidemia, lupus with mild LLE weakness as well as compliance issues who was admitted on 09/28/14 with acute onset left sided weakness and numbness as well as gaze to the right side. Patient reported being out of BP meds for 3 weeks and BP 238/88 at admission. UDS negative.  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  Carotid dopplers significant for R-40-59% ICA stenosis. ST evaluation with high level cognitive deficits affecting STM as well as solving/reasoning especially in math area. Blood pressure remains labile requiring Cardene drip, neck/back pain as well as issues with lethargy. Patient with resultant left sided weakness impacting mobility as well as safety.  Therapy ongoing and CIR recommended by rehab team and MD.    Review of Systems  HENT: Negative for hearing loss.   Eyes: Negative for blurred vision.  Respiratory: Negative for cough and shortness of breath.   Cardiovascular: Negative for chest pain and palpitations.  Gastrointestinal: Negative for heartburn and nausea.  Musculoskeletal: Positive for myalgias, joint pain (left ankle pain) and neck pain.  Neurological: Positive for dizziness, sensory change (pins and neddles LLE), focal weakness, weakness and headaches.  Psychiatric/Behavioral: The patient is nervous/anxious. The patient does not have insomnia.      Past Medical History  Diagnosis Date  . Hypertension   . Lupus   . CKD (chronic kidney disease)     due to lupus/Dr. [name]  . Diabetes mellitus   . Stenosis of cervical spine region     with HNP at C5/6, C6/7  . Metatarsal bone fracture right      4th    Past Surgical History  Procedure Laterality Date  . Breast biopsy        Family History  Problem Relation Age of Onset  . Hypertension    . Lupus    . Rheum arthritis    . Hypertension Mother   . Diabetes Mother   . Hypertension Sister   . Diabetes Father   . Hypertension Maternal Grandmother     Social History:  reports that she has been smoking Cigarettes.  She has a 15 pack-year smoking history. She does not have any smokeless tobacco history on file. She reports that she does not drink alcohol or use illicit drugs.    Allergies  Allergen Reactions  . Sulfa Antibiotics Itching   Medications Prior to Admission  Medication Sig Dispense Refill  . amLODipine (NORVASC) 10 MG tablet take 1 tablet by mouth once daily 30 tablet 0  . aspirin 81 MG tablet Take 81 mg by mouth daily.    . Cholecalciferol (VITAMIN D3) 50000 UNITS CAPS Take 1 capsule by mouth 3 (three) times a week. 30 capsule 3  . ferrous sulfate 325 (65 FE) MG tablet Take 325 mg by mouth daily with breakfast.    . furosemide (LASIX) 20 MG tablet Take 20 mg by mouth 2 (two) times daily.    Marland Kitchen labetalol (NORMODYNE) 200 MG tablet Take 200 mg by mouth 2 (two) times daily.     Marland Kitchen lisinopril (PRINIVIL,ZESTRIL) 20 MG tablet Take 20 mg by mouth daily.    . multivitamin (RENA-VIT)  TABS tablet Take 1 tablet by mouth daily.    . mycophenolate (CELLCEPT) 250 MG capsule Take 1,000 mg by mouth 2 (two) times daily.    . traMADol (ULTRAM) 50 MG tablet Take 50 mg by mouth 2 (two) times daily as needed for moderate pain.     . nicotine (NICODERM CQ - DOSED IN MG/24 HOURS) 14 mg/24hr patch Place 1 patch onto the skin daily.    . reserpine 0.1 MG TABS Take 0.1 mg by mouth daily.    Marland Kitchen spironolactone (ALDACTONE) 25 MG tablet Take 1 tablet (25 mg total) by mouth daily. 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 Transfers Sit to Stand: Min assist Stand pivot transfers: Min assist General transfer comment: cues for hand placement and sequence. Increased cueing and assist with pivot left due to decreased control of LLE with knee extension and increased hip flexion Ambulation/Gait Ambulation/Gait assistance: Min assist Ambulation Distance (Feet): 60 Feet Assistive device: Rolling walker (2 wheeled) Gait Pattern/deviations: Step-to pattern, Decreased stance time - left, Narrow base of support Gait velocity interpretation: Below normal speed for age/gender General Gait Details: Pt with narrow BOS without scissoring and maintaining left foot flat today with decreased knee flexion and short stride length with cues for sequence and RW use    ADL:    Cognition: Cognition Overall Cognitive Status: Impaired/Different from baseline Arousal/Alertness: Awake/alert Orientation Level: Oriented X4 Attention: Selective Selective Attention: Appears intact Memory: Impaired Memory Impairment: Retrieval deficit, Decreased short term memory Decreased Short Term Memory: Verbal basic Awareness: Appears intact Problem Solving:  Impaired Problem Solving Impairment: Verbal complex Executive Function: 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, trade [employer]) Reasoning: Impaired Reasoning Impairment: Verbal complex Safety/Judgment: Appears intact Cognition Arousal/Alertness: Awake/alert Behavior During Therapy: Flat affect Overall Cognitive Status: Impaired/Different from baseline Area of Impairment: Orientation, Safety/judgement Orientation Level: Time Safety/Judgement: Decreased awareness of deficits  Blood pressure 163/66, pulse 96, temperature 98.2 F (36.8 C), temperature source Oral, resp. rate 18, height 5' 5.5" (1.664 m), weight 64.6 kg (142 lb 6.7 oz), last menstrual period 07/18/2012, SpO2 95 %. Physical Exam  Nursing note and vitals reviewed. Constitutional: She is oriented to person, place, and time. She appears well-developed and well-nourished. She appears lethargic. She is easily aroused. Nasal cannula in place.  HENT:  Head: Normocephalic and atraumatic.  Eyes: Conjunctivae are normal. Pupils are equal, round, and reactive to light.  Neck: Normal range of motion. Neck supple.  Cardiovascular: Regular rhythm.  Tachycardia present.   Respiratory: Effort normal. She has wheezes.  GI: Soft. Bowel sounds are normal. She exhibits no distension. There is no tenderness.  Musculoskeletal:  2+ edema bilateral hands  Neurological: She is oriented to person, place, and time and easily aroused. She appears lethargic.  Anxious appearing.  Needs cues to keep eyes open. Left inattention. Able to state day, month as well as medical questions. Able to follow basic commands without difficulty. Subjective RUE weakness --poor effort. LLE weakness with dysesthesias. Sensation 1/2 LUE and LLE with PP and LT. Left PD.   Skin: Skin is warm and dry.  Psychiatric:  Affect flat, follows basic commands.     Results for orders placed or performed during the hospital encounter of 09/28/14 (from the past 24 hour(s))  Basic metabolic panel      Status: Abnormal   Collection Time: 10/02/14  4:54 AM  Result Value Ref Range   Sodium 138 137 - 147 mEq/L   Potassium 4.9 3.7 - 5.3 mEq/L   Chloride 111 96 - 112 mEq/L   CO2 11 (L) 19 - 32 mEq/L   Glucose, Bld 95 70 - 99 mg/dL   BUN 33 (H) 6 - 23 mg/dL   Creatinine, Ser 2.80 (H) 0.50 - 1.10 mg/dL   Calcium 8.3 (L) 8.4 - 10.5 mg/dL   GFR calc non Af Amer 18 (L) >90 mL/min   GFR calc Af Amer 21 (L) >90 mL/min   Anion gap 16 (H) 5 - 15  CBC     Status: Abnormal   Collection Time: 10/02/14  4:54 AM  Result Value Ref Range   WBC 12.5 (H) 4.0 - 10.5 K/uL   RBC 3.21 (L) 3.87 - 5.11 MIL/uL   Hemoglobin 8.4 (L) 12.0 - 15.0 g/dL   HCT 26.6 (L) 36.0 - 46.0 %   MCV 82.9 78.0 - 100.0 fL   MCH 26.2 26.0 - 34.0 pg   MCHC 31.6 30.0 - 36.0 g/dL   RDW 14.6 11.5 - 15.5 %   Platelets 163 150 - 400 K/uL   No results found.  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 Does the need for close, 24 hr/day medical supervision in concert with the patient's rehab needs make it unreasonable for this patient to be served in a less intensive setting? Yes 2. Co-Morbidities requiring supervision/potential complications: htn, dm, ra 3. Due to bladder management, bowel management, safety, skin/wound care, disease management, medication administration, pain management and patient education, does the patient require 24 hr/day rehab nursing? Yes 4. Does the patient require coordinated care of a physician, rehab nurse, PT (1-2 hrs/day, 5 days/week), OT (1-2 hrs/day, 5 days/week) and SLP (1-2 hrs/day, 5 days/week) to address physical and functional deficits in the context of the above medical diagnosis(es)? Yes Addressing deficits in the following areas: balance, endurance, locomotion, strength, transferring, bowel/bladder control, bathing, dressing, feeding, grooming, toileting, cognition, speech and psychosocial support 5. Can the  patient actively participate in an intensive therapy program of at least 3 hrs of therapy per day at least 5 days per week? Yes 6. The potential for patient to make measurable gains while on inpatient rehab is excellent 7. Anticipated functional outcomes upon discharge from inpatient rehab are modified independent  with PT, modified independent with OT, modified independent with SLP. 8. Estimated rehab length of stay to reach the above functional goals is: 7-10 days 9. Does the patient have adequate social supports and living environment to accommodate these discharge functional goals? Yes 10. Anticipated D/C setting: Home 11. Anticipated post D/C treatments: [place] therapy 12. Overall Rehab/Functional Prognosis: excellent  RECOMMENDATIONS: This patient's condition is appropriate for continued rehabilitative care in the following setting: CIR Patient has agreed to participate in recommended program. Yes Note that insurance  prior authorization may be required for reimbursement for recommended care.  Comment: Rehab Admissions Coordinator to follow up.  Thanks,  Meredith Staggers, MD, Mellody Drown     10/02/2014

## 2014-10-02 NOTE — Progress Notes (Signed)
Physical Therapy Treatment Patient Details Name: CORLENE SABIA MRN: [id number] DOB: 08/08/1961 Today's Date: 10/02/2014    History of Present Illness Patient is a 53 y/o female admitted with left side weakness and gaze preference with BP 238/88.  CT showed acute pontine hemorrhagic infarct.  PMH positive for hyperlipidemia, HTN and compliance issues.    PT Comments    Pt mobility limited by pain in neck, back, and L side today.  RN aware.  Pt continues to require MinA throughout mobility for balance and safety.  Still feel CIR is most appropriate D/C plan at this time.  Will continue to follow.    Follow Up Recommendations  CIR     Equipment Recommendations  Rolling walker with 5" wheels    Recommendations for Other Services       Precautions / Restrictions Precautions Precautions: Fall Restrictions Weight Bearing Restrictions: No    Mobility  Bed Mobility Overal bed mobility: Needs Assistance Bed Mobility: Supine to Sit     Supine to sit: Mod assist;HOB elevated     General bed mobility comments: pt requiring increased A to come to sit today 2/2 increased pain.    Transfers Overall transfer level: Needs assistance Equipment used: Rolling walker (2 wheeled) Transfers: Sit to/from [facility] Sit to Stand: Min assist Stand pivot transfers: Min assist       General transfer comment: cues to scoot to EOB prior to trying to stand.  pt needed consistent A throughout transfer for balance and movement of RW.    Ambulation/Gait                 Stairs            Wheelchair Mobility    Modified Rankin (Stroke Patients Only) Modified Rankin (Stroke Patients Only) Pre-Morbid Rankin Score: No symptoms Modified Rankin: Moderately severe disability     Balance Overall balance assessment: Needs assistance Sitting-balance support: No upper extremity supported;Feet supported Sitting balance-Leahy Scale: Fair Sitting balance - Comments:  leans right    Standing balance support: Bilateral upper extremity supported;During functional activity Standing balance-Leahy Scale: Poor                      Cognition Arousal/Alertness: Awake/alert Behavior During Therapy: Flat affect Overall Cognitive Status: Impaired/Different from baseline Area of Impairment: Orientation;Safety/judgement Orientation Level: Disoriented to;Time       Safety/Judgement: Decreased awareness of deficits          Exercises      General Comments        Pertinent Vitals/Pain Pain Assessment: 0-10 Pain Score: 8  Pain Location: Neck, back, L side Pain Descriptors / Indicators: Aching Pain Intervention(s): Limited activity within patient's tolerance;Repositioned;Patient requesting pain meds-RN notified    Home Living                      Prior Function            PT Goals (current goals can now be found in the care plan section) Acute Rehab PT Goals Patient Stated Goal: To return to independent PT Goal Formulation: With patient Time For Goal Achievement: 10/13/14 Potential to Achieve Goals: Good Progress towards PT goals: Not progressing toward goals - comment (2/2 pain)    Frequency  Min 4X/week    PT Plan Current plan remains appropriate    Co-evaluation             End of Session Equipment Utilized During  Treatment: Gait belt Activity Tolerance: Patient limited by pain Patient left: in chair;with call bell/phone within reach;with family/visitor present     Time: 0932-1001 PT Time Calculation (min) (ACUTE ONLY): 29 min  Charges:  $Therapeutic Activity: 23-37 mins                    G CodesCatarina Hartshorn, [place] 10/02/2014, 12:22 PM

## 2014-10-02 NOTE — Evaluation (Signed)
Occupational Therapy Evaluation Patient Details Name: [name] [name] MRN: [id number] DOB: June 08, 1961 Today's Date: 10/02/2014    History of Present Illness Patient is a 53 y/o female admitted with left side weakness and gaze preference with BP 238/88.  CT showed acute pontine hemorrhagic infarct.  PMH positive for hyperlipidemia, HTN and compliance issues.   Clinical Impression   Pt was independent prior to admission in all aspects of her life.  Pt presents today with lethargy, impaired cognition, L inattention, B UE weakness and incoordination, generalized weakness and decreased balance interfering with ability to perform ADL and ADL transfers.   Pt will require intense rehab prior to return home and is highly motivated to return to PLOF.  Will follow acutely.  Follow Up Recommendations  CIR    Equipment Recommendations       Recommendations for Other Services Rehab consult     Precautions / Restrictions Precautions Precautions: Fall Restrictions Weight Bearing Restrictions: No      Mobility Bed Mobility Overal bed mobility: Needs Assistance Bed Mobility: Supine to Sit;Sit to Supine     Supine to sit: Mod assist;HOB elevated Sit to supine: Mod assist   General bed mobility comments: pt requiring increased A to come to sit today 2/2 increased pain.    Transfers Overall transfer level: Needs assistance Equipment used: Rolling walker (2 wheeled) Transfers: Sit to/from Stand Sit to Stand: Min assist Stand pivot transfers: Min assist       General transfer comment: step by step verbal cues to perform sit to stand with RW    Balance Overall balance assessment: Needs assistance Sitting-balance support: Feet supported Sitting balance-Leahy Scale: Fair Sitting balance - Comments: leans right  Postural control: Right lateral lean Standing balance support: Bilateral upper extremity supported;During functional activity Standing balance-Leahy Scale: Poor Standing balance  comment: unable to release walker for UE use                             ADL Overall ADL's : Needs assistance/impaired Eating/Feeding: Minimal assistance;Bed level   Grooming: Wash/dry hands;Wash/dry face;Moderate assistance;Sitting Grooming Details (indicate cue type and reason): decreased thoroughness and task persistence Upper Body Bathing: Moderate assistance;Sitting   Lower Body Bathing: Maximal assistance;Sit to/from stand   Upper Body Dressing : Moderate assistance;Sitting   Lower Body Dressing: Maximal assistance;Sit to/from stand   Toilet Transfer: Minimal assistance;Stand-pivot;RW Armed forces technical officer Details (indicate cue type and reason): simulated to chair Toileting- Clothing Manipulation and Hygiene: Maximal assistance;Sit to/from stand         General ADL Comments: Pt with lethargy interfering with ability to perform ADL.     Vision                     Perception Perception Comments: L side inattention   Praxis      Pertinent Vitals/Pain Pain Assessment: Faces Pain Score: 8  Faces Pain Scale: Hurts even more Pain Location: throat Pain Descriptors / Indicators: Sore Pain Intervention(s): Monitored during session (cool drink)     Hand Dominance Right   Extremity/Trunk Assessment Upper Extremity Assessment Upper Extremity Assessment: RUE deficits/detail;LUE deficits/detail RUE Deficits / Details: tremor with intention, full AROM, strength 4/5 grossly RUE Coordination: decreased fine motor LUE Deficits / Details: Full AROM, strength grossly 4/5, inattention for use LUE Sensation: decreased light touch LUE Coordination: decreased fine motor;decreased gross motor   Lower Extremity Assessment Lower Extremity Assessment: Defer to PT evaluation  Communication Communication Communication: No difficulties   Cognition Arousal/Alertness: Lethargic Behavior During Therapy: Flat affect Overall Cognitive Status: Impaired/Different  from baseline Area of Impairment: Orientation;Safety/judgement Orientation Level: Disoriented to;Time       Safety/Judgement: Decreased awareness of deficits         General Comments       Exercises       Shoulder Instructions      Home Living Family/patient expects to be discharged to:: Private residence Living Arrangements: Children Available Help at Discharge: Family;Available 24 hours/day Type of Home: House Home Access: Stairs to enter [employer] of Steps: 4 Entrance Stairs-Rails: Right Home Layout: Two level;Able to live on main level with bedroom/bathroom     Bathroom Shower/Tub: Walk-in shower   Bathroom Toilet: Handicapped height     Home Equipment: Environmental consultant - standard          Prior Functioning/Environment Level of Independence: Independent        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[employer]    OT Diagnosis: Generalized weakness;Cognitive deficits;Hemiplegia non-dominant side   OT Problem List: Decreased strength;Decreased activity tolerance;Impaired balance (sitting and/or standing);Decreased knowledge of use of DME or AE;Decreased safety awareness;Decreased cognition;Decreased coordination;Pain;Impaired UE functional use   OT Treatment/Interventions: Self-care/ADL training;DME and/or AE instruction;Neuromuscular education;Therapeutic activities;Cognitive remediation/compensation;Patient/family education;Balance training    OT Goals(Current goals can be found in the care plan section) Acute Rehab OT Goals Patient Stated Goal: To return to independent OT Goal Formulation: With patient Time For Goal Achievement: 10/16/14 Potential to Achieve Goals: Good ADL Goals Pt Will Perform Eating: sitting;with supervision Pt Will Perform Grooming: with min guard assist;standing Pt Will Perform Upper Body Dressing: with supervision;sitting Pt Will Perform Lower Body Dressing: with min guard assist;sit to/from stand Pt Will Transfer to Toilet: with min  guard assist;ambulating;bedside commode Pt Will Perform Toileting - Clothing Manipulation and hygiene: with min guard assist;sit to/from stand Additional ADL Goal #1: Pt will locate ADL items and visual targets on L side with minimal verbal cues.  OT Frequency: Min 3X/week   Barriers to D/C:            Co-evaluation              End of Session Equipment Utilized During Treatment: Gait belt;Rolling walker Nurse Communication:  (lethargy)  Activity Tolerance: Patient limited by fatigue Patient left: in bed;with call bell/phone within reach;with bed alarm set   Time: 1505-6979 OT Time Calculation (min): 32 min Charges:  OT General Charges $OT Visit: 1 Procedure OT Evaluation $Initial OT Evaluation Tier I: 1 Procedure OT Treatments $Self Care/Home Management : 8-22 mins G-Codes:    [name] 10/02/2014, 3:27 PM  [phone]

## 2014-10-02 NOTE — Progress Notes (Signed)
STROKE TEAM PROGRESS NOTE   HISTORY [name] [name] is a 53 y.o. female history of hypertension, hyperlipidemia as well as compliance issues with treatment, presenting with acute onset left sided weakness and numbness as well as gaze to the right side. She has no previous history of stroke nor TIA. She ran out of blood pressure medicine about 3 weeks ago. Blood pressure in the emergency room was 238/88. She was initially given 20 mg of labetalol and subsequently started on Cardene drip. 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 NIH stroke score was 7.  LSN: 8:55 PM on 09/28/2014 tPA Given: No: Acute pontine hemorrhage   SUBJECTIVE (INTERVAL HISTORY):  Patient is feeling better today.  . No issues overnight.  . She is now off the Cardene drip   Her oral medications were restarted  . CIR has been recommended. Husband is at bedside . Questions answered   OBJECTIVE Temp:  [97.3 F (36.3 C)-98.3 F (36.8 C)] 98.2 F (36.8 C) (11/30 0749) Pulse Rate:  [70-111] 98 (11/30 1200) Cardiac Rhythm:  [-] Normal sinus rhythm (11/30 1200) Resp:  [16-34] 21 (11/30 1200) BP: (119-177)/(45-69) 152/68 mmHg (11/30 1200) SpO2:  [91 %-98 %] 95 % (11/30 1200)   Recent Labs Lab 09/28/14 2229  GLUCAP 106*    Recent Labs Lab 09/28/14 2153 09/28/14 2202 09/28/14 2340 09/30/14 1000 10/02/14 0454  NA 141 144 140 138 138  K 4.4 4.1 4.2 4.9 4.9  CL 108 114* 109 113* 111  CO2 16*  --  17* 12* 11*  GLUCOSE 90 90 138* 117* 95  BUN 38* 39* 39* 33* 33*  CREATININE 3.20* 3.50* 3.17* 2.76* 2.80*  CALCIUM 9.3  --  9.0 9.0 8.3*    Recent Labs Lab 09/28/14 2153 09/28/14 2340  AST 27 21  ALT 20 17  ALKPHOS 89 83  BILITOT <0.2* <0.2*  PROT 7.8 7.2  ALBUMIN 2.8* 2.6*    Recent Labs Lab 09/28/14 2153 09/28/14 2202 09/28/14 2340 09/30/14 1000 10/02/14 0454  WBC  --   --  5.9 10.0 12.5*  NEUTROABS 3.6  --  5.0  --   --   HGB  --  11.6* 9.7* 9.5* 8.4*  HCT  --  34.0* 30.6*  29.6* 26.6*  MCV  --   --  83.4 83.9 82.9  PLT  --   --  187 165 163   No results for input(s): CKTOTAL, CKMB, CKMBINDEX, TROPONINI in the last 168 hours. No results for input(s): LABPROT, INR in the last 72 hours. No results for input(s): COLORURINE, LABSPEC, [place], GLUCOSEU, HGBUR, BILIRUBINUR, KETONESUR, PROTEINUR, UROBILINOGEN, NITRITE, LEUKOCYTESUR in the last 72 hours.  Invalid input(s): APPERANCEUR     Component Value Date/Time   CHOL 163 09/30/2014 1000   TRIG 117 09/30/2014 1000   HDL 31* 09/30/2014 1000   CHOLHDL 5.3 09/30/2014 1000   VLDL 23 09/30/2014 1000   LDLCALC 109* 09/30/2014 1000   Lab Results  Component Value Date   HGBA1C 4.8 09/30/2014      Component Value Date/Time   LABOPIA NONE DETECTED 09/28/2014 2239   COCAINSCRNUR NONE DETECTED 09/28/2014 2239   LABBENZ NONE DETECTED 09/28/2014 2239   AMPHETMU NONE DETECTED 09/28/2014 2239   THCU NONE DETECTED 09/28/2014 2239   LABBARB NONE DETECTED 09/28/2014 2239     Recent Labs Lab 09/28/14 2340  ETH <11    Ct Head (brain) Wo Contrast 09/28/2014    1. Acute hemorrhagic infarct identified within the  pons.  2. Chronic microvascular disease     Chest Port 1 View 09/29/2014    There is no active cardiopulmonary disease. A PA and lateral chest x-ray would be useful when the patient can tolerate the procedure.   Electronically Signed   By: David  Martinique   On: 09/29/2014 07:42    MRI of the Brain Pending   CT of the head without contrast Pending Carotid Dopplers :Right: 40-59% internal carotid artery stenosis. Left - 1% to 39% ICA stenosis upper end of scale. Bilateral - Vertebral artery flow is antegrade  PHYSICAL EXAM [name] not in distress.Awake alert. Afebrile. Head is nontraumatic. Neck is supple without bruit. Hearing is normal. Cardiac exam RRR, no murmur or gallop. Lungs are clear to auscultation.   Neurological Exam : no changes from previous  exam Awake alert oriented x 3 normal speech and language. Visual fields full. EOMI. Pupils equal and reactive. Mild right lower face asymmetry and numbness, NL flattening. Tongue midline. No drift. Mild diminished fine finger movements on right . Mild right-sided weakness right arm 4/5,  Right leg 3+/5, mild right grip weak. Diminished right hemibody touch/pinprick sensation. Mild right-sided dysmetria. Toes downgoing. Gait deferred    ASSESSMENT/PLAN [name] is a 53 y.o. female with history of hypertension, diabetes history, lupus, tobacco use, hyperlipidemia, and medical noncompliance,  presenting with blood pressure 238/88, right sided numbness and weakness, with left gaze preference. 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  Resultant - right hemiparesis and right hemisensory deficits.  MRI  pending  MRA  not ordered  Carotid Doppler pending  2D Echo not ordered  LDL 146  HgbA1c - 4.8  SCDs for VTE prophylaxis  Diet Heart   aspirin 81 mg orally every day prior to admission, now on no antithrombotics secondary to hemorrhage.   Ongoing aggressive stroke risk factor management  Therapy recommendations:  CIR    Disposition:  CLR Pending  Hypertension  Home meds:  Norvasc 10 mg daily, Lasix 20 mg daily, labetalol 400 mg twice a day, lisinopril 20 mg daily, spironolactone 25 mg daily, and reserpine 0.1 mg daily. Reportedly ran out of blood pressure medications 3 weeks prior to admission.  Stable - off Cardene drip.Home medicines resumed 10/01/14. Goal SBP below 180, still running high but just restarted home meds will review again tomorrow. Patient counseled to be compliant with her blood pressure medications on discharge  Hyperlipidemia  Home meds:  The patient was not on cholesterol lowering medications  prior to admission.  LDL 146, goal < 70  Add Lipitor 20mg   Continue statin at discharge  Diabetes  Hemoglobin A1c - 4.8  Other Stroke Risk Factors  Cigarette smoker, advised to stop smoking  Medical noncompliance   Other Active Problems  Anemia - recheck labs Monday  Kidney disease  Hypertension - SBP in the 150s range (Cardene drip; Zestril; Norvasc; Labetalol; PRN Apresoline) Will increase Labetalol.  Other Pertinent History  Lupus - on immunosuppression.  Transfer to neuro floor bed today. D/W husband and answered questions      Hospital day # 4   Personally examined patient and images, agree with history, physical, neuro exam as stated above. Agree with assessment and plan.   Antony Contras, MD [facility]          To contact  Stroke Continuity provider, please refer to [email or web address] After hours, contact General Neurology

## 2014-10-02 NOTE — Plan of Care (Signed)
Problem: Acute Treatment Outcomes Goal: BP within ordered parameters Outcome: Completed/Met Date Met:  10/02/14 Goal: Coagulation normalized Outcome: Completed/Met Date Met:  10/02/14 Goal: Pain controlled Outcome: Completed/Met Date Met:  10/02/14 Goal: Prognosis discussed with family/patient as appropriate Outcome: Completed/Met Date Met:  10/02/14

## 2014-10-02 NOTE — Progress Notes (Signed)
Pt very sleepy since beginning of this RN shift. Dayshift RN reported pt slept most of the day as well. Pt family expressed concern amount the level of drowsiness. Pt remains neurologically intact otherwise.   Also pt has not had a repeat CT head since admission.   This information was reported to Dr. Nicole Kindred at [facility] 10/02/2014. MD gave verbal order for a non-contrast head CT tomorrow.   [name] [name]

## 2014-10-03 ENCOUNTER — Inpatient Hospital Stay (HOSPITAL_COMMUNITY): Payer: Managed Care, Other (non HMO)

## 2014-10-03 DIAGNOSIS — J9621 Acute and chronic respiratory failure with hypoxia: Secondary | ICD-10-CM

## 2014-10-03 DIAGNOSIS — R1314 Dysphagia, pharyngoesophageal phase: Secondary | ICD-10-CM

## 2014-10-03 MED ORDER — PHENOL 1.4 % MT LIQD: 1.0000 | OROMUCOSAL | Status: DC | PRN

## 2014-10-03 MED ORDER — FUROSEMIDE 10 MG/ML IJ SOLN
40.0000 mg | Freq: Once | INTRAMUSCULAR | Status: AC
  Administered 2014-10-03: 40 mg via INTRAVENOUS
  Filled 2014-10-03: qty 4

## 2014-10-03 MED ORDER — FUROSEMIDE 10 MG/ML IJ SOLN
20.0000 mg | Freq: Two times a day (BID) | INTRAMUSCULAR | Status: DC
  Administered 2014-10-04: 20 mg via INTRAVENOUS
  Filled 2014-10-03 (×3): qty 2

## 2014-10-03 NOTE — Progress Notes (Signed)
Note given to son stating that patient is present in hospital since 11/26.

## 2014-10-03 NOTE — Progress Notes (Signed)
Rehab admissions - I have approval from [facility] for acute inpatient rehab admission for today.  Bed available and will admit to acute inpatient rehab today.  Call me for questions.  #[phone]

## 2014-10-03 NOTE — PMR Pre-admission (Addendum)
PMR Admission Coordinator Pre-Admission Assessment  Patient: [name] [name] is an 53 y.o., female MRN: [id number] DOB: 12/19/1960 Height: 5' 5.5" (166.4 cm) Weight: 64.6 kg (142 lb 6.7 oz)              Insurance Information HMO:      PPO:       PCP:       IPA:       80/20:       OTHER:   PRIMARY: [employer] managed      Policy#: [id number]      Subscriber: [name] [name] CM Name: [name] [name]      Phone#: [phone]     Fax#: [phone] Pre-Cert#: [id number]      Employer: [employer] Benefits:  Phone #: [phone]     Name: [name] [name]. Date: 12/04/12 X 7 days with update on 10/10/14     Deduct: $1500      Out of Pocket Max: $3500.00      Life Max: unlimited CIR: 80% w/auth      SNF: 80% w/auth and 100 days max Outpatient: 36 visits/yr     Co-Pay: $50 copay Home Health: 80%      Co-Pay: 20% DME: 80%     Co-Pay: 20% Providers: in network  Emergency Contact Information Contact Information    Name Relation Home Work Mobile   [name] [name] Son [phone] [name] [name] other [phone]  [phone]   [name] [name] Daughter   [phone]   [name] [name]   [phone]     Current Medical History  Patient Admitting Diagnosis:  [name] pontine infarct  History of Present Illness:  A 53 y.o. female with history of HTN, hyperlipidemia, lupus with mild LLE weakness as well as compliance issues who was admitted on 09/28/14 with acute onset left sided weakness and numbness as well as gaze to the right side. Patient reported being out of BP meds for 3 weeks and BP 238/88 at admission. UDS negative. 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 Carotid dopplers significant for [name]-40-59% ICA stenosis. ST evaluation with high level cognitive deficits affecting STM as well as solving/reasoning especially in math area. Blood pressure  remains labile requiring Cardene drip, neck, back and ankle pain as well as issues with fluctuating bouts of lethargy. Follow up MRI/MRA brain done today revealing changes c/w acute/subacute hemorrhage in right parmedian pons and no underlying mass evident. Minimal diffusion abnormality in high frontal lobes bilaterally likely due to calcification and probably meningioma.  She had worsening of respiratory status on 12/01 with difficulty handling secretions and complaints of severe throat pain. Admit to inpatient rehab on hold 12/01.  She was made NPO and CXR done revealing fluid overload. CCM was consulted for input and patient was treated with IV diuresis with improvement in symptoms. Lisinopril placed on hold due to woresing of renal status. Repeat cardiac echo showed EF 60-70% with AV sclerosis without stenosis. Patient with resultant left sided weakness impacting mobility as well as safety. Therapy ongoing and CIR recommended by rehab team and MD.   Total: 2=NIH  Past Medical History  Past Medical History  Diagnosis Date  . Hypertension   . Lupus   . CKD (chronic kidney disease)     due to lupus/Dr. Justin [name]  . Diabetes mellitus   . Stenosis of cervical spine region     with HNP  at C5/6, C6/7  . Metatarsal bone fracture right    4th  . 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    Family History  family history includes Diabetes in her father and mother; Hypertension in her maternal grandmother, mother, sister, and another family member; Lupus in an other family member; Rheum arthritis in an other family member.  Prior Rehab/Hospitalizations:  None   Current Medications  Current facility-administered medications: acetaminophen (TYLENOL) tablet 650 mg, 650 mg, Oral, Q4H PRN, 650 mg at 10/01/14 0552 **OR** acetaminophen (TYLENOL) suppository 650 mg, 650 mg, Rectal, Q4H PRN, [name] [name];  ALPRAZolam [name] [name]) tablet 0.5 mg, 0.5 mg, Oral, Q6H PRN, [name] [name], 0.5 mg at 09/30/14 2143;   amLODipine (NORVASC) tablet 10 mg, 10 mg, Oral, Daily, [name] Contras, MD, 10 mg at 10/05/14 1019 atorvastatin (LIPITOR) tablet 20 mg, 20 mg, Oral, q1800, [name] Beam, MD, 20 mg at 10/04/14 2213;  cholecalciferol (VITAMIN D) tablet 5,000 Units, 5,000 Units, Oral, Once per day on Mon Wed Fri, [name] Sethi, MD, 5,000 Units at 10/02/14 1539;  fentaNYL (SUBLIMAZE) injection 12.5 mcg, 12.5 mcg, Intravenous, Q2H PRN, [name] Miyamoto, MD, 12.5 mcg at 10/04/14 1220 ferrous sulfate tablet 325 mg, 325 mg, Oral, Q breakfast, [name] Contras, MD, 325 mg at 10/05/14 1020;  hydrALAZINE (APRESOLINE) injection 10 mg, 10 mg, Intravenous, Q4H PRN, [name] Portland, MD, 10 mg at 10/03/14 2001;  labetalol (NORMODYNE) tablet 400 mg, 400 mg, Oral, BID, [name] Beam, MD, 400 mg at 10/05/14 1019;  labetalol (NORMODYNE,TRANDATE) injection 10 mg, 10 mg, Intravenous, Q4H PRN, [name] Miyamoto, MD, 10 mg at 10/04/14 1652 menthol-cetylpyridinium (CEPACOL) lozenge 3 mg, 1 lozenge, Oral, PRN, [name] Starch, NP, 3 mg at 10/02/14 1552;  multivitamin with minerals tablet 1 tablet, 1 tablet, Oral, Q1500, [name] Contras, MD, 1 tablet at 10/04/14 1421;  mycophenolate (CELLCEPT) capsule 1,000 mg, 1,000 mg, Oral, BID, [name] Contras, MD, 1,000 mg at 10/05/14 1020 nicotine (NICODERM CQ - dosed in mg/24 hr) patch 7 mg, 7 mg, Transdermal, Daily, [name] Contras, MD, 7 mg at 10/05/14 1020;  oxyCODONE-acetaminophen (PERCOCET/ROXICET) 5-325 MG per tablet 1 tablet, 1 tablet, Oral, Q6H PRN, [name] Beam, MD, 1 tablet at 10/05/14 1019;  phenol (CHLORASEPTIC) mouth spray 1 spray, 1 spray, Mouth/Throat, PRN, Rush Farmer, MD senna-docusate (Senokot-S) tablet 1 tablet, 1 tablet, Oral, BID, [name] [name], 1 tablet at 10/05/14 1019  Patients Current Diet: Diet Heart  Precautions / Restrictions Precautions Precautions: Fall Restrictions Weight Bearing Restrictions: No   Prior Activity Level Community (5-7x/wk): Went out daily.  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   Home Assistive Devices / Equipment Home Assistive Devices/Equipment: Kasandra Knudsen (specify quad or straight) (pt stated she uses it when her arthritis flares up) Home Equipment: Walker - standard  Prior Functional Level Prior Function Level of Independence: Independent 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[employer]  Current Functional Level Cognition  Arousal/Alertness: Awake/alert Overall Cognitive Status: Impaired/Different from baseline Current Attention Level: Selective Orientation Level: Oriented X4 Safety/Judgement: Decreased awareness of safety, Decreased awareness of deficits General Comments: Pt responded to questions but did not initiate interactions; she was tired at the end of the session and had eyes closed when she was not conversing with therapy. Attention: Selective Selective Attention: Appears intact Memory: Impaired Memory Impairment: Retrieval deficit, Decreased short term memory Decreased Short Term Memory: Verbal basic Awareness: Appears intact Problem Solving: Impaired Problem Solving Impairment: Verbal complex Executive Function: 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, trade union) Reasoning: Impaired Reasoning  Impairment: Verbal complex Safety/Judgment: Appears intact    Extremity Assessment (includes Sensation/Coordination)  Upper Extremity Assessment: LUE deficits/detail LUE Deficits / Details: AROM WFL, strength shoulder flexion 4/5, elbow flexion 4/5, extension Lower Extremity Assessment: LLE deficits/detail LLE Deficits / Details: AROM WFL except ankle DF limited due to strength/coordination, strength Hip flexion 4-/5, knee extension 4/5, knee flexion 3+/5, ankle DF 2-/5 Cervical / Trunk Assessment: Other exceptions    ADLs  Overall ADL's : Needs assistance/impaired Eating/Feeding: Minimal assistance, Sitting Eating/Feeding Details (indicate cue type and reason): using L hand to hold pudding cup with min A to prevent dropping Grooming: Wash/dry hands,  Wash/dry face, Moderate assistance, Sitting Grooming Details (indicate cue type and reason): decreased thoroughness and task persistence Upper Body Bathing: Moderate assistance, Sitting Lower Body Bathing: Maximal assistance, Sit to/from stand Upper Body Dressing : Moderate assistance, Sitting Lower Body Dressing: Maximal assistance, Sit to/from stand Toilet Transfer: Moderate assistance, BSC, Stand-pivot Toilet Transfer Details (indicate cue type and reason): simulated to chair Toileting- Clothing Manipulation and Hygiene: Moderate assistance, Sitting/lateral lean Functional mobility during ADLs: Moderate assistance, Cueing for safety, Cueing for sequencing (stand pivot transfer) General ADL Comments: Pt with lethargy interfering with ability to perform ADL.    Mobility  Overal bed mobility: Needs Assistance Bed Mobility: Supine to Sit Rolling: Mod assist Sidelying to sit: Mod assist Supine to sit: Mod assist, HOB elevated Sit to supine: Mod assist General bed mobility comments: A to move legs off bed. Able to push trunk up to sitting with increased HOB    Transfers  Overall transfer level: Needs assistance Equipment used: Rolling walker (2 wheeled) Transfers: Stand Pivot Transfers, Sit to/from Stand Sit to Stand: Min assist Stand pivot transfers: Mod assist General transfer comment: increased assistance to transfer toward weak L side    Ambulation / Gait / Stairs / Wheelchair Mobility  Ambulation/Gait Ambulation/Gait assistance: Min assist Ambulation Distance (Feet): 60 Feet Assistive device: Rolling walker (2 wheeled) Gait Pattern/deviations: Step-to pattern, Decreased stride length, Decreased step length - left, Ataxic Gait velocity interpretation: Below normal speed for age/gender General Gait Details: Pt able to advance LLE and place it in an appropriate position with frequent verbal cues from therapist.  Min assist required to maintain balance.  Verbal cues for direction  and posture.  Pt had one minor LOB but recovered independently.    Posture / Balance Dynamic Sitting Balance Sitting balance - Comments: improved midline position today    Special needs/care consideration BiPAP/CPAP No CPM No Continuous Drip IV No Dialysis No     Life Vest No Oxygen NO Special Bed No Trach Size No Wound Vac (area) No     Skin NO                             Bowel mgmt: Last BM 10/05/14 Bladder mgmt: Voiding WDL.  Incontinent episode last night. Diabetic mgmt Yes, on oral medications at home    Previous Home Environment Living Arrangements: Children Available Help at Discharge: Family, Available 24 hours/day Type of Home: House Home Layout: Two level, Able to live on main level with bedroom/bathroom Home Access: Stairs to enter Entrance Stairs-Rails: Right Entrance Stairs-Number of Steps: 4 Bathroom Shower/Tub: Multimedia programmer: Handicapped height [place]: No  Discharge [place] for Discharge Living Setting: Lives with (comment), Apartment (Lives with 2 sons and 1 dtr.) Type of Home at Discharge: Apartment Discharge Home Layout: Two level, Able to live  on main level with bedroom/bathroom Alternate Level Stairs-Number of Steps: Flight Discharge Home Access: Stairs to enter Entrance Stairs-Number of Steps: 5 step entry Does the patient have any problems obtaining your medications?: Yes (Describe)  Social/Family/Support Systems Patient Roles: Parent (Has 2 sons and 1 daughter.) Contact Information: Navaya Wiatrek - son [phone] Anticipated Caregiver: Robin Searing - Rodney's father Anticipated Caregiver's Contact Information: Delfino Lovett - [phone] Ability/Limitations of Caregiver: Children all work.  Richard, Rodney's father, not working and can stay with patient as needed. Caregiver Availability: Other (Comment) (Can have someone stay while children working as needed.) Discharge Plan Discussed with Primary  Caregiver: Yes Is Caregiver In Agreement with Plan?: Yes Does Caregiver/Family have Issues with Lodging/Transportation while Pt is in Rehab?: No  Goals/Additional Needs Patient/Family Goal for Rehab: PT/OTST mod I goals Expected length of stay: 7-10 days Cultural Considerations: None Dietary Needs: Heart, thin liquids Equipment Needs: TBD Pt/Family Agrees to Admission and willing to participate: Yes Program Orientation Provided & Reviewed with Pt/Caregiver Including Roles  & Responsibilities: Yes  Decrease burden of Care through IP rehab admission: N/A  Possible need for SNF placement upon discharge: Not planned  Patient Condition: This patient's condition remains as documented in the consult dated 10/02/14, in which the Rehabilitation Physician determined and documented that the patient's condition is appropriate for intensive rehabilitative care in an inpatient rehabilitation facility.   Note:  Held admission on 12/02 to inpatient rehab due to pulmonary edema, fever, decline in status.  See above history update.  See by Dr. Naaman Plummer today and cleared for acute inpatient rehab admission.  Currently requiring min assist to ambulate 60 ft, min assist transfers, mod assist toilet transfers. Will admit to acute inpatient rehab today.  Preadmission Screen Completed By:  Retta Diones, 10/05/2014 11:07 AM ______________________________________________________________________   Discussed status with Dr. Naaman Plummer on 10/05/14 at 1107 and received telephone approval for admission today.  Admission Coordinator:  Retta Diones, time1123/Date12/03/15

## 2014-10-03 NOTE — Progress Notes (Signed)
STROKE TEAM PROGRESS NOTE   HISTORY [name] [name] is a 53 y.o. female history of hypertension, hyperlipidemia as well as compliance issues with treatment, presenting with acute onset left sided weakness and numbness as well as gaze to the right side. She has no previous history of stroke nor TIA. She ran out of blood pressure medicine about 3 weeks ago. Blood pressure in the emergency room was 238/88. She was initially given 20 mg of labetalol and subsequently started on Cardene drip. 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 NIH stroke score was 7.  LSN: 8:55 PM on 09/28/2014 tPA Given: No: Acute pontine hemorrhage   SUBJECTIVE (INTERVAL HISTORY):  Patient is feeling better today.  . No issues overnight.  .  BP well controlled on oral medications  . CIR has been recommended. Husband is at bedside . Questions answered   OBJECTIVE Temp:  [98.2 F (36.8 C)-100.5 F (38.1 C)] 99.6 F (37.6 C) (12/01 1148) Pulse Rate:  [99-118] 103 (12/01 1315) Cardiac Rhythm:  [-] Normal sinus rhythm (12/01 0800) Resp:  [20-38] 38 (12/01 1315) BP: (155-194)/(62-84) 170/68 mmHg (12/01 1315) SpO2:  [92 %-99 %] 99 % (12/01 1315)   Recent Labs Lab 09/28/14 2229  GLUCAP 106*    Recent Labs Lab 09/28/14 2153 09/28/14 2202 09/28/14 2340 09/30/14 1000 10/02/14 0454  NA 141 144 140 138 138  K 4.4 4.1 4.2 4.9 4.9  CL 108 114* 109 113* 111  CO2 16*  --  17* 12* 11*  GLUCOSE 90 90 138* 117* 95  BUN 38* 39* 39* 33* 33*  CREATININE 3.20* 3.50* 3.17* 2.76* 2.80*  CALCIUM 9.3  --  9.0 9.0 8.3*    Recent Labs Lab 09/28/14 2153 09/28/14 2340  AST 27 21  ALT 20 17  ALKPHOS 89 83  BILITOT <0.2* <0.2*  PROT 7.8 7.2  ALBUMIN 2.8* 2.6*    Recent Labs Lab 09/28/14 2153 09/28/14 2202 09/28/14 2340 09/30/14 1000 10/02/14 0454  WBC  --   --  5.9 10.0 12.5*  NEUTROABS 3.6  --  5.0  --   --   HGB  --  11.6* 9.7* 9.5* 8.4*  HCT  --  34.0* 30.6* 29.6* 26.6*  MCV  --   --   83.4 83.9 82.9  PLT  --   --  187 165 163   No results for input(s): CKTOTAL, CKMB, CKMBINDEX, TROPONINI in the last 168 hours. No results for input(s): LABPROT, INR in the last 72 hours. No results for input(s): COLORURINE, LABSPEC, [place], GLUCOSEU, HGBUR, BILIRUBINUR, KETONESUR, PROTEINUR, UROBILINOGEN, NITRITE, LEUKOCYTESUR in the last 72 hours.  Invalid input(s): APPERANCEUR     Component Value Date/Time   CHOL 163 09/30/2014 1000   TRIG 117 09/30/2014 1000   HDL 31* 09/30/2014 1000   CHOLHDL 5.3 09/30/2014 1000   VLDL 23 09/30/2014 1000   LDLCALC 109* 09/30/2014 1000   Lab Results  Component Value Date   HGBA1C 4.8 09/30/2014      Component Value Date/Time   LABOPIA NONE DETECTED 09/28/2014 2239   COCAINSCRNUR NONE DETECTED 09/28/2014 2239   LABBENZ NONE DETECTED 09/28/2014 2239   AMPHETMU NONE DETECTED 09/28/2014 2239   THCU NONE DETECTED 09/28/2014 2239   LABBARB NONE DETECTED 09/28/2014 2239     Recent Labs Lab 09/28/14 2340  ETH <11    Ct Head (brain) Wo Contrast 09/28/2014    1. 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  2. Chronic microvascular disease  Chest Port 1 View 09/29/2014    There is no active cardiopulmonary disease. A PA and lateral chest x-ray would be useful when the patient can tolerate the procedure.   Electronically Signed   By: David  Martinique   On: 09/29/2014 07:42    MRI of the Brain Pending   CT of the head without contrast Pending Carotid Dopplers :Right: 40-59% internal carotid artery stenosis. Left - 1% to 39% ICA stenosis upper end of scale. Bilateral - Vertebral artery flow is antegrade  PHYSICAL EXAM [name] not in distress.Awake alert. Afebrile. Head is nontraumatic. Neck is supple without bruit. Hearing is normal. Cardiac exam RRR, no murmur or gallop. Lungs are clear to auscultation.   Neurological Exam : no changes from previous exam Awake alert oriented x 3  normal speech and language. Visual fields full. EOMI. Pupils equal and reactive. Mild right lower face asymmetry and numbness, NL flattening. Tongue midline. No drift. Mild diminished fine finger movements on right . Mild right-sided weakness right arm 4/5,  Right leg 3+/5, mild right grip weak. Diminished right hemibody touch/pinprick sensation. Mild right-sided dysmetria. Toes downgoing. Gait deferred    ASSESSMENT/PLAN [name] [name] is a 53 y.o. female with history of hypertension, diabetes history, lupus, tobacco use, hyperlipidemia, and medical noncompliance,  presenting with blood pressure 238/88, right sided numbness and weakness, with left gaze preference. 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  Resultant - right hemiparesis and right hemisensory deficits.  MRI  pending  MRA  not ordered  Carotid Doppler pending  2D Echo not ordered  LDL 146  HgbA1c - 4.8  SCDs for VTE prophylaxis  Diet NPO time specified   aspirin 81 mg orally every day prior to admission, now on no antithrombotics secondary to hemorrhage.   Ongoing aggressive stroke risk factor management  Therapy recommendations:  CIR    Disposition:  CLR Pending  Hypertension  Home meds:  Norvasc 10 mg daily, Lasix 20 mg daily, labetalol 400 mg twice a day, lisinopril 20 mg daily, spironolactone 25 mg daily, and reserpine 0.1 mg daily. Reportedly ran out of blood pressure medications 3 weeks prior to admission.  Stable - off Cardene drip.Home medicines resumed 10/01/14. Goal SBP below 180, still running high but just restarted home meds will review again tomorrow. Patient counseled to be compliant with her blood pressure medications on discharge  Hyperlipidemia  Home meds:  The patient was not on cholesterol lowering medications prior to  admission.  LDL 146, goal < 70  Add Lipitor 20mg   Continue statin at discharge  Diabetes  Hemoglobin A1c - 4.8  Other Stroke Risk Factors  Cigarette smoker, advised to stop smoking  Medical noncompliance   Other Active Problems  Anemia - recheck labs Monday  Kidney disease  Hypertension - SBP in the 150s range (Cardene drip; Zestril; Norvasc; Labetalol; PRN Apresoline) Will increase Labetalol.  Other Pertinent History  Lupus - on immunosuppression.  Transfer to neuro floor bed today. D/W husband and answered questions      Hospital day # 5   Personally examined patient and images, agree with history, physical, neuro exam as stated above. Agree with assessment and plan. Transfer to inpatient rehab today pending bed availability and after insurance approval.  Antony Contras, MD [facility]  To contact Stroke Continuity provider, please refer to [email or web address] After hours, contact General Neurology

## 2014-10-03 NOTE — Evaluation (Signed)
Clinical/Bedside Swallow Evaluation Patient Details  Name: [name] [name] MRN: [id number] Date of Birth: 1961/05/16  Today's Date: 10/03/2014 Time: 6213-0865 SLP Time Calculation (min) (ACUTE ONLY): 23 min  Past Medical History:  Past Medical History  Diagnosis Date  . Hypertension   . Lupus   . CKD (chronic kidney disease)     due to lupus/Dr. Hyman Hopes  . Diabetes mellitus   . Stenosis of cervical spine region     with HNP at C5/6, C6/7  . Metatarsal bone fracture right    4th  . 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   Past Surgical History:  Past Surgical History  Procedure Laterality Date  . Breast biopsy     HPI:  Patient is a 53 y/o female admitted with left side weakness and gaze preference with BP 238/88.  CT showed acute pontine hemorrhagic infarct.  PMH positive for hyperlipidemia, HTN and compliance issues. Patient with new onset dysphagia 11/30 (patient reported) and increased lethargy. Repeat MRI complete 12/1 and indicated signal changes compatible with acute/subacute hemorrhage in the   Assessment / Plan / Recommendation Clinical Impression  Patient presents with significant change in swallowing function as compared to when seen 11/27 for cognitive-linguistic evaluation. Patient with increased lethargy and now presents with a severe pharyngeal based dysphagia characterized by suspected aspiration of secretions and c/o severe odynophagia. SLP provided cueing for attempts at initiation of swallow with hesitancy reported by patient due to pain. Once swallow initiated, patient with rapid worsening of wet vocal quality, throat clearing, and cough indicative of decreased airway protection. Differential diagnosis include a neurologically based dysphagia however per MD, MRI does not indicate significant changes vs possible thrush (pharyngeal?) which could result in pain/edema with swallow. Discussed with both CCM and neuro MD. Recommend NPO as aspiration risk severe at this time. SLP  will f/u 12/2 for readiness for instrumental testing.     Aspiration Risk  Severe    Diet Recommendation NPO   Medication Administration: Via alternative means    Other  Recommendations Oral Care Recommendations:  (QID)   Follow Up Recommendations  Inpatient Rehab    Frequency and Duration min 2x/week  2 weeks   Pertinent Vitals/Pain n/a        Swallow Study    General HPI: Patient is a 53 y/o female admitted with left side weakness and gaze preference with BP 238/88.  CT showed acute pontine hemorrhagic infarct.  PMH positive for hyperlipidemia, HTN and compliance issues. Patient with new onset dysphagia 11/30 (patient reported) and increased lethargy. Repeat MRI complete 12/1 and indicated signal changes compatible with acute/subacute hemorrhage in the Type of Study: Bedside swallow evaluation Previous Swallow Assessment: none Diet Prior to this Study: Regular;Thin liquids Temperature Spikes Noted: No Respiratory Status: Room air History of Recent Intubation: No Behavior/Cognition: Lethargic;Cooperative Oral Cavity - Dentition: Adequate natural dentition Self-Feeding Abilities: Able to feed self Patient Positioning: Upright in bed Baseline Vocal Quality: Wet Volitional Cough: Weak Volitional Swallow: Able to elicit    Oral/Motor/Sensory Function Overall Oral Motor/Sensory Function: Appears within functional limits for tasks assessed (except left eye twitching, droop at baseline)   Ice Chips Ice chips: Impaired Presentation: Spoon Pharyngeal Phase Impairments: Wet Vocal Quality;Throat Clearing - Immediate;Cough - Delayed   Thin Liquid Thin Liquid: Impaired Presentation: Spoon Pharyngeal  Phase Impairments: Multiple swallows;Wet Vocal Quality;Cough - Immediate    Nectar Thick Nectar Thick Liquid: Not tested   Honey Thick Honey Thick Liquid: Not tested  Puree Puree: Not tested   Solid   GO   [name] Can MA, [name] [phone]  Solid: Not tested        [name] [name] 10/03/2014,3:52 PM

## 2014-10-03 NOTE — Evaluation (Signed)
Clinical/Bedside Swallow Evaluation Patient Details  Name: [name] [name] MRN: [id number] Date of Birth: January 13, 1961  Today's Date: 10/03/2014 Time: 4098-1191 SLP Time Calculation (min) (ACUTE ONLY): 23 min  Past Medical History:  Past Medical History  Diagnosis Date  . Hypertension   . Lupus   . CKD (chronic kidney disease)     due to lupus/Dr. Hyman Hopes  . Diabetes mellitus   . Stenosis of cervical spine region     with HNP at C5/6, C6/7  . Metatarsal bone fracture right    4th  . 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   Past Surgical History:  Past Surgical History  Procedure Laterality Date  . Breast biopsy     HPI:  Patient is a 53 y/o female admitted with left side weakness and gaze preference with BP 238/88.  CT showed acute pontine hemorrhagic infarct.  PMH positive for hyperlipidemia, HTN and compliance issues. Patient with new onset dysphagia 11/30 (patient reported) and increased lethargy. Repeat MRI complete 12/1 and indicated signal changes compatible with acute/subacute hemorrhage in the   Assessment / Plan / Recommendation Clinical Impression    Patient presents with significant change in swallowing function as compared to when seen 11/27 for cognitive-linguistic evaluation. Patient with increased lethargy and now presents with a severe pharyngeal based dysphagia characterized by suspected aspiration of secretions and c/o severe odynophagia. SLP provided cueing for attempts at initiation of swallow with hesitancy reported by patient due to pain. Once swallow initiated, patient with rapid worsening of wet vocal quality, throat clearing, and cough indicative of decreased airway protection. Differential diagnosis include a neurologically based dysphagia however per MD, MRI does not indicate significant changes vs possible thrush (pharyngeal?) which could result in pain/edema with swallow. Discussed with both CCM and neuro MD. Recommend NPO as aspiration risk severe at this time. SLP  will f/u 12/2 for readiness for instrumental testing.     Aspiration Risk  Severe    Diet Recommendation NPO   Medication Administration: Via alternative means    Other  Recommendations Oral Care Recommendations:  (QID)   Follow Up Recommendations  Inpatient Rehab    Frequency and Duration min 2x/week  2 weeks               General HPI: Patient is a 53 y/o female admitted with left side weakness and gaze preference with BP 238/88.  CT showed acute pontine hemorrhagic infarct.  PMH positive for hyperlipidemia, HTN and compliance issues. Patient with new onset dysphagia 11/30 (patient reported) and increased lethargy. Repeat MRI complete 12/1 and indicated signal changes compatible with acute/subacute hemorrhage in the Type of Study: Bedside swallow evaluation Previous Swallow Assessment: none Diet Prior to this Study: Regular;Thin liquids Temperature Spikes Noted: No Respiratory Status: Room air History of Recent Intubation: No Behavior/Cognition: Lethargic;Cooperative Oral Cavity - Dentition: Adequate natural dentition Self-Feeding Abilities: Able to feed self Patient Positioning: Upright in bed Baseline Vocal Quality: Wet Volitional Cough: Weak Volitional Swallow: Able to elicit    Oral/Motor/Sensory Function Overall Oral Motor/Sensory Function: Appears within functional limits for tasks assessed (except left eye twitching, droop at baseline)   Ice Chips Ice chips: Impaired Presentation: Spoon Pharyngeal Phase Impairments: Wet Vocal Quality;Throat Clearing - Immediate;Cough - Delayed   Thin Liquid Thin Liquid: Impaired Presentation: Spoon Pharyngeal  Phase Impairments: Multiple swallows;Wet Vocal Quality;Cough - Immediate    Nectar Thick Nectar Thick Liquid: Not tested   Honey Thick Honey Thick Liquid: Not tested   Puree  Puree: Not tested   Solid   GO   [name] Benedict MA, [name] [phone]  Solid: Not tested       [name] [name] Meryl 10/03/2014,3:47  PM

## 2014-10-03 NOTE — Progress Notes (Signed)
UR completed.  Therapies recommending CIR at d/c and admission coordinator has auth process in progress.  Sandi Mariscal, RN BSN [place] CCM Trauma/Neuro ICU Case Manager [phone]

## 2014-10-03 NOTE — Discharge Summary (Addendum)
Physician Discharge Summary  Patient ID: [name] [name] MRN: [id number] DOB/AGE: 1960/11/14 53 y.o.  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 female history of hypertension, hyperlipidemia as well as compliance issues with treatment, presenting with acute onset right-sided weakness and numbness as well as gaze to the left side. He has no previous history of stroke nor TIA. She ran out of blood pressure medicine about 3 weeks ago. Blood pressure in the emergency room was 238/88. She was initially given 20 mg of labetalol and subsequently started on Cardene drip. CT scan of her head showed 9 mm acute  pontine hemorrhage. NIH stroke score was 7.  LSN: 8:55 PM on 09/28/2014 tPA Given: No: Acute pontine hemorrhage She was admitted to the intensive care unit where blood pressure was tightly controlled initially with Cardizem drip and subsequently when she was able to swallow her own medications were resumed. Patient admitted to being off her blood pressure medications for several weeks. Repeat CT scan of the head showed stable appearance of the small pontine hemorrhage and subsequently an MRI scan was obtained which Confirmed the right paramedian pontine hemorrhage without any underlying mass lesion noted. There are age advanced changes of generalized cerebral atrophy and chronic microvascular ischemia as well. MRA of the brain did not reveal any large vessel stenosis or aneurysm. Carotid ultrasound showed 40-59% right ICA and 1-39% left ICA stenosis only. Urine drug screen was negative.  Complete metabolic panel labs and CBC were unremarkable. Total cholesterol was 163, triglycerides 117, HDL 31 and LDL 109 mg percent. Patient was seen by physical therapy, occupational therapy therapy and rehabilitation team and felt to be a good patient for inpatient rehabilitation. She was found to have only mild right lower facial and hand and leg weakness on the day of discharge with diminished right hemibody sensation and felt to be a candidate for inpatient rehabilitation and medically stable for discharge.she was counseled to be compliant with her blood pressure and other medications and keep follow-up with her medical physician Consults: rehabilitation medicine  Significant Diagnostic Studies: see above Treatments: see above  Discharge Exam: Blood pressure 154/62, pulse 84, temperature 98.4 F (36.9 C), temperature source Oral, resp. rate 17, height 5' 5.5" (1.664 m), weight 142 lb 6.7 oz (64.6 kg), last menstrual period 07/18/2012, SpO2 97 %. PHYSICAL EXAM [name] not in distress.Awake alert. Afebrile. Head is nontraumatic. Neck is supple without bruit. Hearing is normal. Cardiac exam RRR, no murmur or gallop. Lungs are clear to auscultation.   Neurological Exam : no changes from previous exam Awake alert oriented x 3 normal speech and language. Visual fields full. EOMI. Pupils equal and reactive. Mild right lower face asymmetry and numbness, NL flattening. Tongue midline. No drift. Mild diminished fine finger movements on right . Mild right-sided weakness right arm 4/5, Right leg 3+/5, mild right grip weak. Diminished right hemibody touch/pinprick sensation. Mild right-sided dysmetria. Toes downgoing. Gait deferred  Disposition: Inpatient rehabilitation      Discharge Instructions    Ambulatory referral to Neurology    Complete by:  As directed  Stroke patient. Dr. Leonie Man prefers follow up in 1 month            Medication List    TAKE these  medications        amLODipine 10 MG tablet  Commonly known as:  NORVASC  take 1 tablet by mouth once daily     aspirin 81 MG tablet  Take 81 mg by mouth daily.     ferrous sulfate 325 (65 FE) MG tablet  Take 325 mg by mouth daily with breakfast.     furosemide 20 MG tablet  Commonly known as:  LASIX  Take 20 mg by mouth 2 (two) times daily.     labetalol 200 MG tablet  Commonly known as:  NORMODYNE  Take 200 mg by mouth 2 (two) times daily.     lisinopril 20 MG tablet  Commonly known as:  PRINIVIL,ZESTRIL  Take 20 mg by mouth daily.     multivitamin Tabs tablet  Take 1 tablet by mouth daily.     mycophenolate 250 MG capsule  Commonly known as:  CELLCEPT  Take 1,000 mg by mouth 2 (two) times daily.     nicotine 14 mg/24hr patch  Commonly known as:  NICODERM CQ - dosed in mg/24 hours  Place 1 patch onto the skin daily.     reserpine 0.1 MG Tabs tablet  Take 0.1 mg by mouth daily.     spironolactone 25 MG tablet  Commonly known as:  ALDACTONE  Take 1 tablet (25 mg total) by mouth daily.     traMADol 50 MG tablet  Commonly known as:  ULTRAM  Take 50 mg by mouth 2 (two) times daily as needed for moderate pain.     Vitamin D3 50000 UNITS Caps  Take 1 capsule by mouth 3 (three) times a week.       Follow-up Information    Follow up with SETHI,PRAMOD, MD In 1 month.   Specialties:  Neurology, Radiology   Why:  Stroke Clinic, Office will call you with appointment date & time   Contact information:   [place] [address] [phone]       Follow up with Bobby Rumpf, MD. Schedule an appointment as soon as possible for a visit in 1 month.   Specialty:  Infectious Diseases   Why:  for hospital follow up   Contact information:   [place] [address] [phone]     Discharge summary time spent : 35 minutes  Signed: SETHI,PRAMOD 10/05/2014, 3:18 PM

## 2014-10-03 NOTE — Plan of Care (Signed)
Problem: Consults Goal: Intracranial Hemorrhage Patient Education See Patient Education Module for education specifics.  Outcome: Completed/Met Date Met:  10/03/14

## 2014-10-03 NOTE — Consult Note (Signed)
Name: [name] [name] MRN: [id number] DOB: 1961-03-27    ADMISSION DATE:  09/28/2014 CONSULTATION DATE:  10/03/14  REFERRING MD :  Dr. 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  Patient was progressing and planned for discharge 12/1 but d/c was held due to concerns of pulmonary edema / respiratory status changes.  PCCM consulted for evaluation.    SIGNIFICANT EVENTS  11/26  Admit with acute pontine hemorrhage.  Stopped anti-hypertensive's 3 weeks PTA.  12/01  Planned d/c to CIR, concerns for pulmonary edema & d/c held.  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  The patient reportedly ran out of blood pressure medications approximately 3 weeks prior to admission.  No hx of stroke or TIA.  She was admitted to the ICU and BP was tightly controlled with IV medications.  Patient had apparently progressed well with an unchanged MRI of head on 12/1 and was being discharged to Rehab.  However, she was later noted to have difficulty swallowing, weak cough, pain with swallowing, change in secretion management, mild tachycardia, and low grade fevers (tmax 100.5).  PCCM consulted for evaluation.   PAST MEDICAL HISTORY :   has a past medical history of Hypertension; Lupus; CKD (chronic kidney disease); Diabetes mellitus; Stenosis of cervical spine region; Metatarsal bone fracture (right); and Chronic ankle pain.  has past surgical history that includes Breast biopsy.    Prior to Admission medications   Medication Sig Start Date End  Date Taking? Authorizing Provider  amLODipine (NORVASC) 10 MG tablet take 1 tablet by mouth once daily 12/04/11  Yes Dayna N Dunn, PA-C  aspirin 81 MG tablet Take 81 mg by mouth daily.   Yes Historical Provider, MD  Cholecalciferol (VITAMIN D3) 50000 UNITS CAPS Take 1 capsule by mouth 3 (three) times a week. 08/13/12  Yes Robyn Haber, MD  ferrous sulfate 325 (65 FE) MG tablet Take 325 mg by mouth daily with breakfast.   Yes Historical Provider, MD  furosemide (LASIX) 20 MG tablet Take 20 mg by mouth 2 (two) times daily.   Yes Historical Provider, MD  labetalol (NORMODYNE) 200 MG tablet Take 200 mg by mouth 2 (two) times daily.    Yes Historical Provider, MD  lisinopril (PRINIVIL,ZESTRIL) 20 MG tablet Take 20 mg by mouth daily.   Yes Historical Provider, MD  multivitamin (RENA-VIT) TABS tablet Take 1 tablet by mouth daily.   Yes Historical Provider, MD  mycophenolate (CELLCEPT) 250 MG capsule Take 1,000 mg by mouth 2 (two) times daily.   Yes Historical Provider, MD  traMADol (ULTRAM) 50 MG tablet Take 50 mg by mouth 2 (two) times daily as needed for moderate pain.    Yes Historical Provider, MD  nicotine (NICODERM CQ - DOSED IN MG/24 HOURS) 14 mg/24hr patch Place 1 patch onto the skin daily.    Historical Provider, MD  reserpine 0.1 MG TABS Take 0.1 mg by mouth daily.    Historical Provider, MD  spironolactone (ALDACTONE) 25 MG tablet  Take 1 tablet (25 mg total) by mouth daily. Patient not taking: Reported on 09/28/2014 08/10/12   Robyn Haber, MD   Allergies  Allergen Reactions  . Sulfa Antibiotics Itching    FAMILY HISTORY:  family history includes Diabetes in her father and mother; Hypertension in her maternal grandmother, mother, sister, and another family member; Lupus in an other family member; Rheum arthritis in an other family member.   SOCIAL HISTORY:  reports that she has been smoking Cigarettes.  She has a 15 pack-year smoking history. She does not have any smokeless tobacco  history on file. She reports that she does not drink alcohol or use illicit drugs.  REVIEW OF SYSTEMS:  Unable to complete with patient. Information obtained from staff and previous medical documentation.   SUBJECTIVE:  SLP reports worsening of swallowing, wet voice  VITAL SIGNS: Temp:  [98.2 F (36.8 C)-100.5 F (38.1 C)] 99.6 F (37.6 C) (12/01 1148) Pulse Rate:  [99-118] 103 (12/01 1315) Resp:  [20-38] 38 (12/01 1315) BP: (155-194)/(62-84) 170/68 mmHg (12/01 1315) SpO2:  [92 %-99 %] 99 % (12/01 1315)  PHYSICAL EXAMINATION: General:  Adult female, generalized weakness Neuro:  Mild lethargy, arouses, appropriate, generalized weakness, able to lift all 4 ext's to gravity, tongue midline, speech clear, mild wet voice quality HEENT:  Mm pink/moist Cardiovascular:  s1s2 rrr, tachy  Lungs:  Mild tachypnea, no distress, lungs bilaterally diminished but clear  Abdomen:  Round/soft, bsx4 active  Musculoskeletal:  No acute deformities  Skin:  Warm/dry, no edema    Recent Labs Lab 09/28/14 2340 09/30/14 1000 10/02/14 0454  NA 140 138 138  K 4.2 4.9 4.9  CL 109 113* 111  CO2 17* 12* 11*  BUN 39* 33* 33*  CREATININE 3.17* 2.76* 2.80*  GLUCOSE 138* 117* 95    Recent Labs Lab 09/28/14 2340 09/30/14 1000 10/02/14 0454  HGB 9.7* 9.5* 8.4*  HCT 30.6* 29.6* 26.6*  WBC 5.9 10.0 12.5*  PLT 187 165 163   Ct Head Wo Contrast  10/02/2014   CLINICAL DATA:  Brainstem hemorrhage. Acute onset of left-sided weakness gaze preference. Hypertension.  EXAM: CT HEAD WITHOUT CONTRAST  TECHNIQUE: Contiguous axial images were obtained from the base of the skull through the vertex without intravenous contrast.  COMPARISON:  CT head without contrast 09/28/2014.  FINDINGS: A 9.5 mm hyperdense lesion in the right paramedian posterior pons is slightly less distinct than on the prior exam, compatible with hemorrhage. There is mild surrounding edema. The fourth ventricle is intact.  Scattered subcortical  white matter hypoattenuation is advanced for age. No other hemorrhage or mass lesion is present. The cortex is intact without evidence for acute infarct. The basal ganglia are intact.  The paranasal sinuses and mastoid air cells are clear. The osseous skull is intact.  IMPRESSION: 1. Slight decrease in size of a hyperdense right paramedian posterior pontine lesion compatible with resolving hemorrhage. Recommend continued follow-up to assure resolution of the hyperdense lesion compatible with hemorrhage. 2. Mild subcortical white matter hypoattenuation is advanced for age.   Electronically Signed   By: Lawrence Santiago M.D.   On: 10/02/2014 12:57   Mr Jodene Nam Head Wo Contrast  10/03/2014   CLINICAL DATA:  Acute onset of left-sided weakness and numbness with right-sided gaze. Abnormal CT scan with a focal brainstem hemorrhage.  EXAM: MRI HEAD WITHOUT CONTRAST  MRA HEAD WITHOUT CONTRAST  TECHNIQUE: Multiplanar, multiecho pulse sequences of the brain and surrounding structures were obtained without intravenous contrast. Angiographic images of  the head were obtained using MRA technique without contrast.  COMPARISON:  CT head without contrast 10/02/2014  FINDINGS: MRI HEAD FINDINGS  A focal area of susceptibility is evident within the right paramedian pons compatible with hemorrhage. There is surrounding edema. No discrete mass lesion is evident. There is intermediate to high T1 signal compatible with acute blood products. The fourth ventricle is intact. No additional hemorrhage is present.  3 punctate areas of focal diffusion signal abnormality are evident thumb height medial left frontal lobe, just above the cingulate gyrus. These are adjacent to an area of focal calcification, likely representing a small meningioma. No other diffusion abnormalities are present.  Mild generalized atrophy and white matter changes are present bilaterally, advanced for age.  Flow is present in the major intracranial arteries. The globes and  orbits are intact. Midline structures are within normal limits. Mild mucosal thickening is present in the maxillary sinuses bilaterally.  MRA HEAD FINDINGS  The internal carotid arteries are within normal limits from the high cervical segments through the ICA termini bilaterally. The A1 and M1 segments are normal. Anterior communicating artery is patent. The MCA bifurcations are intact. There is some attenuation of distal small vessels bilaterally.  The right vertebral artery is the dominant vessel. There is mild irregularity of the distal left vertebral artery without a focal stenosis. The right PICA origin is visualized and normal. Bilateral AICA vessels are evident. The basilar artery is normal. Both posterior cerebral arteries originate the basilar tip. There is mild attenuation of distal PCA branch vessels.  IMPRESSION: 1. Signal changes compatible with acute/subacute hemorrhage in the right paramedian pons. No underlying mass lesion is evident. 2. Age advanced atrophy and white matter disease likely reflects the sequela of chronic microvascular ischemia. 3. Minimal diffusion abnormality in the medial high frontal lobes bilaterally is likely related to the adjacent calcification and probable meningioma. Punctate cortical infarcts in addition to the brainstem hemorrhage are considered less likely. 4. Mild distal small vessel disease without significant proximal stenosis, aneurysm, or branch vessel occlusion.   Electronically Signed   By: Lawrence Santiago M.D.   On: 10/03/2014 10:19   Mr Brain Wo Contrast  10/03/2014   CLINICAL DATA:  Acute onset of left-sided weakness and numbness with right-sided gaze. Abnormal CT scan with a focal brainstem hemorrhage.  EXAM: MRI HEAD WITHOUT CONTRAST  MRA HEAD WITHOUT CONTRAST  TECHNIQUE: Multiplanar, multiecho pulse sequences of the brain and surrounding structures were obtained without intravenous contrast. Angiographic images of the head were obtained using MRA technique  without contrast.  COMPARISON:  CT head without contrast 10/02/2014  FINDINGS: MRI HEAD FINDINGS  A focal area of susceptibility is evident within the right paramedian pons compatible with hemorrhage. There is surrounding edema. No discrete mass lesion is evident. There is intermediate to high T1 signal compatible with acute blood products. The fourth ventricle is intact. No additional hemorrhage is present.  3 punctate areas of focal diffusion signal abnormality are evident thumb height medial left frontal lobe, just above the cingulate gyrus. These are adjacent to an area of focal calcification, likely representing a small meningioma. No other diffusion abnormalities are present.  Mild generalized atrophy and white matter changes are present bilaterally, advanced for age.  Flow is present in the major intracranial arteries. The globes and orbits are intact. Midline structures are within normal limits. Mild mucosal thickening is present in the maxillary sinuses bilaterally.  MRA HEAD FINDINGS  The internal carotid arteries are within normal limits from  the high cervical segments through the ICA termini bilaterally. The A1 and M1 segments are normal. Anterior communicating artery is patent. The MCA bifurcations are intact. There is some attenuation of distal small vessels bilaterally.  The right vertebral artery is the dominant vessel. There is mild irregularity of the distal left vertebral artery without a focal stenosis. The right PICA origin is visualized and normal. Bilateral AICA vessels are evident. The basilar artery is normal. Both posterior cerebral arteries originate the basilar tip. There is mild attenuation of distal PCA branch vessels.  IMPRESSION: 1. Signal changes compatible with acute/subacute hemorrhage in the right paramedian pons. No underlying mass lesion is evident. 2. Age advanced atrophy and white matter disease likely reflects the sequela of chronic microvascular ischemia. 3. Minimal  diffusion abnormality in the medial high frontal lobes bilaterally is likely related to the adjacent calcification and probable meningioma. Punctate cortical infarcts in addition to the brainstem hemorrhage are considered less likely. 4. Mild distal small vessel disease without significant proximal stenosis, aneurysm, or branch vessel occlusion.   Electronically Signed   By: Lawrence Santiago M.D.   On: 10/03/2014 10:19   Dg Chest Port 1 View  10/03/2014   CLINICAL DATA:  Shortness of breath this morning.  EXAM: PORTABLE CHEST - 1 VIEW  COMPARISON:  09/29/2014  FINDINGS: The cardiac silhouette appears mildly enlarged. There is new pulmonary vascular congestion with interstitial and airspace opacities in the mid and lower lungs bilaterally. Both hemidiaphragms are obscured. Small bilateral pleural effusions are suspected. No pneumothorax is identified. No acute osseous abnormality is identified.  IMPRESSION: New pulmonary vascular congestion and bibasilar opacities consistent with pulmonary edema. Possible small bilateral pleural effusions.   Electronically Signed   By: Logan Bores   On: 10/03/2014 14:04    ASSESSMENT / PLAN:   Pulmonary Edema - in setting of CKD / HTN RLL Airspace Disease - pleural effusion in setting of CKD, HTN vs atx and poor inspiration on film  At Risk for Aspiration   Plan: Push pulmonary hygiene:  Mobilize, flutter valve  Additional dose 40 mg lasix now Follow up CXR in am to eval  Monitor fever curve / leukocytosis  Monitor for airway protection, given mental status would not use bipap unless improvement   Dysphagia   Plan: SLP following NPO   Pontine ICH   Plan: Further imaging in am 12/2 per Neuro BP goal < 180   HTN - longstanding, pattern of LVH on CXR.  Difficulty obtaining medications as outpatient ?   Plan: Assess ECHO to r/o cardiac source edema  Hold lisinopril in setting of CKD  BP control per primary  Will likely need med assistance prior to  d/c  CKD  Plan: Trend BMP Hold nephrotoxic agents  Noe Gens, NP-C [facility] Pgr: [phone] or [phone]  Patient's airway's protection is questionable at best.  Continue airway clearance.  Close monitoring for airway protection.  No BiPAP.  If deteriorates then proceed with intubation.  Hold in the ICU due to high risk of respiratory failure.  The patient is critically ill with multiple organ systems failure and requires high complexity decision making for assessment and support, frequent evaluation and titration of therapies, application of advanced monitoring technologies and extensive interpretation of multiple databases.   Critical Care Time devoted to patient care services described in this note is  35  Minutes. This time reflects time of care of this signee Dr Jennet Maduro. This critical care time does  not reflect procedure time, or teaching time or supervisory time of PA/NP/Med student/Med Resident etc but could involve care discussion time.  Rush Farmer, M.D. [facility]Critical [facility] Medicine. Pager: [phone]. After hours pager: [phone].  10/03/2014, 3:22 PM

## 2014-10-03 NOTE — Progress Notes (Signed)
Physical Therapy Treatment Patient Details Name: [name] [name] MRN: [id number] DOB: October 09, 1961 Today's Date: 10/03/2014    History of Present Illness Patient is a 53 y/o female admitted with left side weakness and gaze preference with BP 238/88.  CT showed acute pontine hemorrhagic infarct.  PMH positive for hyperlipidemia, HTN and compliance issues.    PT Comments    Pt continues to be drowsy, but able to participate in mobility today.  Pt requires A for all aspects of mobility and will continue to benefit from CIR at D/C.  Will continue to follow.    Follow Up Recommendations  CIR     Equipment Recommendations  Rolling walker with 5" wheels    Recommendations for Other Services       Precautions / Restrictions Precautions Precautions: Fall Restrictions Weight Bearing Restrictions: No    Mobility  Bed Mobility Overal bed mobility: Needs Assistance Bed Mobility: Supine to Sit     Supine to sit: Mod assist;HOB elevated     General bed mobility comments: pt continues to require A to come to sitting and needs cues for attending to task.    Transfers Overall transfer level: Needs assistance Equipment used: Rolling walker (2 wheeled) Transfers: Sit to/from Stand Sit to Stand: Min assist         General transfer comment: cues for use of UEs and positioning of L LE.    Ambulation/Gait Ambulation/Gait assistance: Min assist Ambulation Distance (Feet): 25 Feet Assistive device: Rolling walker (2 wheeled) Gait Pattern/deviations: Step-to pattern;Decreased step length - left;Decreased weight shift to left;Ataxic     General Gait Details: pt maintained more wide BOS and tends to drag L LE.  Cues to attend to L LE and not leave it outside of RW.  pt leans posteriorly and needs cueing for more upright psoture.     Stairs            Wheelchair Mobility    Modified Rankin (Stroke Patients Only) Modified Rankin (Stroke Patients Only) Pre-Morbid Rankin Score: No  symptoms Modified Rankin: Moderately severe disability     Balance Overall balance assessment: Needs assistance Sitting-balance support: Feet supported;No upper extremity supported Sitting balance-Leahy Scale: Fair Sitting balance - Comments: leans right  Postural control: Right lateral lean;Posterior lean Standing balance support: Bilateral upper extremity supported Standing balance-Leahy Scale: Poor                      Cognition Arousal/Alertness: Lethargic Behavior During Therapy: Flat affect Overall Cognitive Status: Impaired/Different from baseline Area of Impairment: Orientation;Safety/judgement Orientation Level: Disoriented to;Time       Safety/Judgement: Decreased awareness of deficits     General Comments: pt drowsy and maintaining eyes closed throughout most of session unless prompted.      Exercises      General Comments        Pertinent Vitals/Pain Pain Assessment: 0-10 Pain Score: 5  Pain Location: Throat and neck Pain Descriptors / Indicators: Sore Pain Intervention(s): Monitored during session;Repositioned    Home Living                      Prior Function            PT Goals (current goals can now be found in the care plan section) Acute Rehab PT Goals Patient Stated Goal: To return to independent PT Goal Formulation: With patient Time For Goal Achievement: 10/13/14 Progress towards PT goals: Progressing toward goals    Frequency  Min 4X/week    PT Plan Current plan remains appropriate    Co-evaluation             End of Session Equipment Utilized During Treatment: Gait belt Activity Tolerance: Patient limited by lethargy Patient left: in chair;with call bell/phone within reach     Time: 1008-1035 PT Time Calculation (min) (ACUTE ONLY): 27 min  Charges:  $Gait Training: 8-22 mins $Therapeutic Activity: 8-22 mins                    G CodesCatarina Hartshorn, [place] 10/03/2014, 11:12  AM

## 2014-10-03 NOTE — Progress Notes (Addendum)
Rehab admissions - I have cancelled the admission to inpatient rehab today per my medical director, Dr. Naaman Plummer.  The results of the chest xray were abnormal today.  I will follow up again tomorrow.  Call me for questions.  [phone]   Given the edema/?infiltrates on CXR, pt's dyspnea, poor handling of secretions, etc I feel it's best that we hold on this admission for now to make sure she's medically ready for our unit. Will follow up tomorrow.  I did make the patient NPO pending SLP bedside evaluation.  Meredith Staggers, MD, [facility] 10/03/2014

## 2014-10-04 ENCOUNTER — Inpatient Hospital Stay (HOSPITAL_COMMUNITY): Payer: Managed Care, Other (non HMO)

## 2014-10-04 ENCOUNTER — Encounter (HOSPITAL_COMMUNITY): Payer: Self-pay | Admitting: Radiology

## 2014-10-04 DIAGNOSIS — I619 Nontraumatic intracerebral hemorrhage, unspecified: Secondary | ICD-10-CM | POA: Insufficient documentation

## 2014-10-04 DIAGNOSIS — I616 Nontraumatic intracerebral hemorrhage, multiple localized: Secondary | ICD-10-CM

## 2014-10-04 DIAGNOSIS — R0609 Other forms of dyspnea: Secondary | ICD-10-CM

## 2014-10-04 DIAGNOSIS — I517 Cardiomegaly: Secondary | ICD-10-CM

## 2014-10-04 DIAGNOSIS — G8929 Other chronic pain: Secondary | ICD-10-CM

## 2014-10-04 DIAGNOSIS — J9602 Acute respiratory failure with hypercapnia: Secondary | ICD-10-CM | POA: Insufficient documentation

## 2014-10-04 DIAGNOSIS — E872 Acidosis: Secondary | ICD-10-CM

## 2014-10-04 DIAGNOSIS — M25571 Pain in right ankle and joints of right foot: Secondary | ICD-10-CM

## 2014-10-04 LAB — BASIC METABOLIC PANEL
Anion gap: 21 — ABNORMAL HIGH (ref 5–15)
BUN: 46 mg/dL — ABNORMAL HIGH (ref 6–23)
CO2: 11 meq/L — AB (ref 19–32)
Calcium: 9.1 mg/dL (ref 8.4–10.5)
Chloride: 111 mEq/L (ref 96–112)
Creatinine, Ser: 3.21 mg/dL — ABNORMAL HIGH (ref 0.50–1.10)
GFR calc Af Amer: 18 mL/min — ABNORMAL LOW (ref >90)
GFR, EST NON AFRICAN AMERICAN: 16 mL/min — AB (ref >90)
GLUCOSE: 75 mg/dL (ref 70–99)
POTASSIUM: 4.5 meq/L (ref 3.7–5.3)
SODIUM: 143 meq/L (ref 137–147)

## 2014-10-04 LAB — CBC
HEMATOCRIT: 26 % — AB (ref 36.0–46.0)
HEMOGLOBIN: 8.5 g/dL — AB (ref 12.0–15.0)
MCH: 26.3 pg (ref 26.0–34.0)
MCHC: 32.7 g/dL (ref 30.0–36.0)
MCV: 80.5 fL (ref 78.0–100.0)
Platelets: 205 10*3/uL (ref 150–400)
RBC: 3.23 MIL/uL — ABNORMAL LOW (ref 3.87–5.11)
RDW: 14 % (ref 11.5–15.5)
WBC: 13 10*3/uL — ABNORMAL HIGH (ref 4.0–10.5)

## 2014-10-04 LAB — BLOOD GAS, ARTERIAL
Acid-base deficit: 12.8 mmol/L — ABNORMAL HIGH (ref 0.0–2.0)
Bicarbonate: 12.2 mEq/L — ABNORMAL LOW (ref 20.0–24.0)
Drawn by: 249101
FIO2: 0.21 %
O2 SAT: 94.6 %
PO2 ART: 82.5 mmHg (ref 80.0–100.0)
Patient temperature: 98.6
TCO2: 12.9 mmol/L (ref 0–100)
pCO2 arterial: 24.4 mmHg — ABNORMAL LOW (ref 35.0–45.0)
pH, Arterial: 7.318 — ABNORMAL LOW (ref 7.350–7.450)

## 2014-10-04 LAB — LACTIC ACID, PLASMA: Lactic Acid, Venous: 0.5 mmol/L (ref 0.5–2.2)

## 2014-10-04 LAB — MAGNESIUM: MAGNESIUM: 1.8 mg/dL (ref 1.5–2.5)

## 2014-10-04 MED ORDER — SODIUM CHLORIDE 0.45 % IV SOLN
INTRAVENOUS | Status: DC
  Administered 2014-10-04: 12:00:00 via INTRAVENOUS
  Filled 2014-10-04 (×2): qty 100

## 2014-10-04 MED ORDER — SODIUM CHLORIDE 0.45 % IV SOLN: INTRAVENOUS | Status: DC

## 2014-10-04 MED ORDER — FENTANYL CITRATE 0.05 MG/ML IJ SOLN
12.5000 ug | INTRAMUSCULAR | Status: DC | PRN
  Administered 2014-10-04 – 2014-10-05 (×2): 12.5 ug via INTRAVENOUS
  Filled 2014-10-04: qty 2

## 2014-10-04 MED ORDER — FENTANYL CITRATE 0.05 MG/ML IJ SOLN
INTRAMUSCULAR | Status: AC
  Administered 2014-10-04: 12.5 ug via INTRAVENOUS
  Filled 2014-10-04: qty 2

## 2014-10-04 MED ORDER — LABETALOL HCL 5 MG/ML IV SOLN
10.0000 mg | INTRAVENOUS | Status: DC | PRN
  Administered 2014-10-04: 10 mg via INTRAVENOUS
  Filled 2014-10-04 (×2): qty 4

## 2014-10-04 NOTE — Progress Notes (Signed)
STROKE TEAM PROGRESS NOTE   HISTORY [name] [name] is a 53 y.o. female history of hypertension, hyperlipidemia as well as compliance issues with treatment, presenting with acute onset left sided weakness and numbness as well as gaze to the right side. She has no previous history of stroke nor TIA. She ran out of blood pressure medicine about 3 weeks ago. Blood pressure in the emergency room was 238/88. She was initially given 20 mg of labetalol and subsequently started on Cardene drip. 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 NIH stroke score was 7.  LSN: 8:55 PM on 09/28/2014 tPA Given: No: Acute pontine hemorrhage   SUBJECTIVE (INTERVAL HISTORY):  Patient is feeling better today.  . No issues overnight.  .  BP well controlled on oral medications  Follow up chest xray shows resolving pulm edema and Ct head shows stable pontine hemorrhage. Speech therapy plan swallow eval today. CIR has been recommended but on hold pending medical stability. Husband is at bedside . Questions answered   OBJECTIVE Temp:  [98.5 F (36.9 C)-100.5 F (38.1 C)] 99.1 F (37.3 C) (12/02 1200) Pulse Rate:  [110-120] 110 (12/02 1400) Cardiac Rhythm:  [-] Sinus tachycardia (12/02 0800) Resp:  [19-44] 36 (12/02 1400) BP: (142-194)/(66-156) 176/73 mmHg (12/02 1400) SpO2:  [88 %-99 %] 99 % (12/02 1400)   Recent Labs Lab 09/28/14 2229  GLUCAP 106*    Recent Labs Lab 09/28/14 2153 09/28/14 2202 09/28/14 2340 09/30/14 1000 10/02/14 0454 10/04/14 0258  NA 141 144 140 138 138 143  K 4.4 4.1 4.2 4.9 4.9 4.5  CL 108 114* 109 113* 111 111  CO2 16*  --  17* 12* 11* 11*  GLUCOSE 90 90 138* 117* 95 75  BUN 38* 39* 39* 33* 33* 46*  CREATININE 3.20* 3.50* 3.17* 2.76* 2.80* 3.21*  CALCIUM 9.3  --  9.0 9.0 8.3* 9.1  MG  --   --   --   --   --  1.8    Recent Labs Lab 09/28/14 2153 09/28/14 2340  AST 27 21  ALT 20 17  ALKPHOS 89 83  BILITOT <0.2* <0.2*  PROT 7.8 7.2  ALBUMIN 2.8*  2.6*    Recent Labs Lab 09/28/14 2153 09/28/14 2202 09/28/14 2340 09/30/14 1000 10/02/14 0454 10/04/14 0258  WBC  --   --  5.9 10.0 12.5* 13.0*  NEUTROABS 3.6  --  5.0  --   --   --   HGB  --  11.6* 9.7* 9.5* 8.4* 8.5*  HCT  --  34.0* 30.6* 29.6* 26.6* 26.0*  MCV  --   --  83.4 83.9 82.9 80.5  PLT  --   --  187 165 163 205   No results for input(s): CKTOTAL, CKMB, CKMBINDEX, TROPONINI in the last 168 hours. No results for input(s): LABPROT, INR in the last 72 hours. No results for input(s): COLORURINE, LABSPEC, [place], GLUCOSEU, HGBUR, BILIRUBINUR, KETONESUR, PROTEINUR, UROBILINOGEN, NITRITE, LEUKOCYTESUR in the last 72 hours.  Invalid input(s): APPERANCEUR     Component Value Date/Time   CHOL 163 09/30/2014 1000   TRIG 117 09/30/2014 1000   HDL 31* 09/30/2014 1000   CHOLHDL 5.3 09/30/2014 1000   VLDL 23 09/30/2014 1000   LDLCALC 109* 09/30/2014 1000   Lab Results  Component Value Date   HGBA1C 4.8 09/30/2014      Component Value Date/Time   LABOPIA NONE DETECTED 09/28/2014 2239   COCAINSCRNUR NONE DETECTED 09/28/2014 2239   LABBENZ  NONE DETECTED 09/28/2014 2239   AMPHETMU NONE DETECTED 09/28/2014 2239   THCU NONE DETECTED 09/28/2014 2239   LABBARB NONE DETECTED 09/28/2014 2239     Recent Labs Lab 09/28/14 2340  ETH <11    Ct Head (brain) Wo Contrast 09/28/2014    1. 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  2. Chronic microvascular disease     Chest Port 1 View 09/29/2014    There is no active cardiopulmonary disease. A PA and lateral chest x-ray would be useful when the patient can tolerate the procedure.   Electronically Signed   By: David  Martinique   On: 09/29/2014 07:42    MRI of the Brain Pending   CT of the head without contrast Pending Carotid Dopplers :Right: 40-59% internal carotid artery stenosis. Left - 1% to 39% ICA stenosis upper end of scale. Bilateral - Vertebral artery flow is antegrade  PHYSICAL EXAM Pleasant  middle aged african american lady not in distress.Awake alert. Afebrile. Head is nontraumatic. Neck is supple without bruit. Hearing is normal. Cardiac exam RRR, no murmur or gallop. Lungs are clear to auscultation.   Neurological Exam : no changes from previous exam Awake alert oriented x 3 normal speech and language. Visual fields full. EOMI. Pupils equal and reactive. Mild right lower face asymmetry and numbness, NL flattening. Tongue midline. No drift. Mild diminished fine finger movements on right . Mild right-sided weakness right arm 4/5,  Right leg 3+/5, mild right grip weak. Diminished right hemibody touch/pinprick sensation. Mild right-sided dysmetria. Toes downgoing. Gait deferred    ASSESSMENT/PLAN [name] is a 53 y.o. female with history of hypertension, diabetes history, lupus, tobacco use, hyperlipidemia, and medical noncompliance,  presenting with blood pressure 238/88, right sided numbness and weakness, with left gaze preference. 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  Resultant - right hemiparesis and right hemisensory deficits.  MRI  pending  MRA  not ordered  Carotid Doppler pending  2D Echo not ordered  LDL 146  HgbA1c - 4.8  SCDs for VTE prophylaxis  Diet Heart   aspirin 81 mg orally every day prior to admission, now on no antithrombotics secondary to hemorrhage.   Ongoing aggressive stroke risk factor management  Therapy recommendations:  CIR    Disposition:  CLR Pending  Hypertension  Home meds:  Norvasc 10 mg daily, Lasix 20 mg daily, labetalol 400 mg twice a day, lisinopril 20 mg daily, spironolactone 25 mg daily, and reserpine 0.1 mg daily. Reportedly ran out of blood pressure medications 3 weeks prior to admission.  Stable - off Cardene drip.Home medicines resumed 10/01/14.  Goal SBP below 180, still running high but just restarted home meds will review again tomorrow. Patient counseled to be compliant with her blood pressure medications on discharge  Hyperlipidemia  Home meds:  The patient was not on cholesterol lowering medications prior to admission.  LDL 146, goal < 70  Add Lipitor 20mg   Continue statin at discharge  Diabetes  Hemoglobin A1c - 4.8  Other Stroke Risk Factors  Cigarette smoker, advised to stop smoking  Medical noncompliance   Other Active Problems  Anemia - recheck labs Monday  Kidney disease  Hypertension - SBP in the 150s range (Cardene drip; Zestril; Norvasc; Labetalol; PRN Apresoline) Will increase Labetalol.  Other Pertinent History  Lupus - on immunosuppression.  Transfer to neuro floor  bed today. D/W husband and answered questions.Appreciate CCM help in managing pulm edema      Hospital day # 6   Personally examined patient and images, agree with history, physical, neuro exam as stated above. Agree with assessment and plan. Transfer to neurology floor bed today  l.  Antony Contras, MD [facility]          To contact Stroke Continuity provider, please refer to [email or web address] After hours, contact General Neurology

## 2014-10-04 NOTE — Consult Note (Signed)
Name: [name] [name] MRN: [id number] DOB: [birth year]    ADMISSION DATE:  09/28/2014 CONSULTATION DATE:  10/03/14  REFERRING MD :  Dr. 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  Patient was progressing and planned for discharge 12/1 but d/c was held due to concerns of pulmonary edema / respiratory status changes.  PCCM consulted for evaluation.    SIGNIFICANT EVENTS  11/26  Admit with acute pontine hemorrhage.  Stopped anti-hypertensive's 3 weeks PTA.  12/01  Planned d/c to CIR, concerns for pulmonary edema & d/c held.  PCCM consulted  STUDIES:  11/26  CT Head >> acute hemorrhage within the pons, chronic microvascular disease 11/30  CT Head >> decrease of hemorrhage size 12/01  MRI Head >> acute / subacute hemorrhage in R paramedian pons, advanced age atrophy   SUBJECTIVE: no sob, has a cough  VITAL SIGNS: Temp:  [98.5 F (36.9 C)-100.5 F (38.1 C)] 100.3 F (37.9 C) (12/02 0800) Pulse Rate:  [102-120] 118 (12/02 0900) Resp:  [19-44] 26 (12/02 0900) BP: (142-194)/(65-156) 183/80 mmHg (12/02 0900) SpO2:  [88 %-99 %] 99 % (12/02 0900)  PHYSICAL EXAMINATION: General:  Adult female, generalized weakness Neuro:  More awake, follows commands, left slight weaker than rt HEENT:  Mm pink/moist Cardiovascular:  s1s2 rrr, tachy mild Lungs:  NO SOB, cta improved  Abdomen:  Round/soft, bsx4 active  Musculoskeletal:  No acute deformities  Skin:  Warm/dry, no edema    Recent Labs Lab 09/30/14 1000 10/02/14 0454 10/04/14 0258  NA 138 138 143  K 4.9 4.9 4.5  CL 113* 111 111  CO2 12* 11* 11*  BUN 33* 33* 46*  CREATININE 2.76* 2.80* 3.21*  GLUCOSE 117* 95 75    Recent Labs Lab 09/30/14 1000 10/02/14 0454 10/04/14 0258  HGB 9.5* 8.4* 8.5*  HCT 29.6* 26.6* 26.0*  WBC 10.0 12.5* 13.0*  PLT 165 163 205   Ct Head Wo Contrast  10/04/2014   CLINICAL DATA:  Follow-up exam for  intracranial hemorrhage, left-sided weakness  EXAM: CT HEAD WITHOUT CONTRAST  TECHNIQUE: Contiguous axial images were obtained from the base of the skull through the vertex without intravenous contrast.  COMPARISON:  Prior MRI from 10/14/2014 and CT from 10/02/2014.  FINDINGS: There has been continued interval evolution of right paramedian hemorrhagic focus within the pons, which is slightly less dense in appearance as compared to prior CT from 10/02/2014. This hemorrhage measures or size at 9-10 mm AP, and 7 mm transverse. This is slightly decreased in size. No significant edema. The adjacent fourth ventricle and basilar cisterns remain widely patent. No hydrocephalus.  No new intracranial hemorrhage or infarct. Chronic microvascular ischemic changes again noted. No midline shift. No extra-axial fluid collection.  No acute abnormality seen about the orbits. Scalp soft tissues within normal limits. Calvarium intact.  Paranasal sinuses and mastoid air cells remain clear  IMPRESSION: 1. Continued interval evolution of resolving parenchymal hemorrhage within the right paramedian pons, which is slightly decreased in size and less dense as compared to 10/02/14. No hydrocephalus. 2. No new intracranial process. 3. Chronic microvascular ischemic disease.   Electronically Signed   By: Jeannine Boga M.D.   On: 10/04/2014 05:41   Ct Head Wo Contrast  10/02/2014   CLINICAL DATA:  Brainstem hemorrhage. Acute onset of left-sided weakness gaze preference. Hypertension.  EXAM: CT HEAD WITHOUT CONTRAST  TECHNIQUE: Contiguous axial images were obtained  from the base of the skull through the vertex without intravenous contrast.  COMPARISON:  CT head without contrast 09/28/2014.  FINDINGS: A 9.5 mm hyperdense lesion in the right paramedian posterior pons is slightly less distinct than on the prior exam, compatible with hemorrhage. There is mild surrounding edema. The fourth ventricle is intact.  Scattered subcortical white  matter hypoattenuation is advanced for age. No other hemorrhage or mass lesion is present. The cortex is intact without evidence for acute infarct. The basal ganglia are intact.  The paranasal sinuses and mastoid air cells are clear. The osseous skull is intact.  IMPRESSION: 1. Slight decrease in size of a hyperdense right paramedian posterior pontine lesion compatible with resolving hemorrhage. Recommend continued follow-up to assure resolution of the hyperdense lesion compatible with hemorrhage. 2. Mild subcortical white matter hypoattenuation is advanced for age.   Electronically Signed   By: Lawrence Santiago M.D.   On: 10/02/2014 12:57   Mr Jodene Nam Head Wo Contrast  10/03/2014   CLINICAL DATA:  Acute onset of left-sided weakness and numbness with right-sided gaze. Abnormal CT scan with a focal brainstem hemorrhage.  EXAM: MRI HEAD WITHOUT CONTRAST  MRA HEAD WITHOUT CONTRAST  TECHNIQUE: Multiplanar, multiecho pulse sequences of the brain and surrounding structures were obtained without intravenous contrast. Angiographic images of the head were obtained using MRA technique without contrast.  COMPARISON:  CT head without contrast 10/02/2014  FINDINGS: MRI HEAD FINDINGS  A focal area of susceptibility is evident within the right paramedian pons compatible with hemorrhage. There is surrounding edema. No discrete mass lesion is evident. There is intermediate to high T1 signal compatible with acute blood products. The fourth ventricle is intact. No additional hemorrhage is present.  3 punctate areas of focal diffusion signal abnormality are evident thumb height medial left frontal lobe, just above the cingulate gyrus. These are adjacent to an area of focal calcification, likely representing a small meningioma. No other diffusion abnormalities are present.  Mild generalized atrophy and white matter changes are present bilaterally, advanced for age.  Flow is present in the major intracranial arteries. The globes and orbits  are intact. Midline structures are within normal limits. Mild mucosal thickening is present in the maxillary sinuses bilaterally.  MRA HEAD FINDINGS  The internal carotid arteries are within normal limits from the high cervical segments through the ICA termini bilaterally. The A1 and M1 segments are normal. Anterior communicating artery is patent. The MCA bifurcations are intact. There is some attenuation of distal small vessels bilaterally.  The right vertebral artery is the dominant vessel. There is mild irregularity of the distal left vertebral artery without a focal stenosis. The right PICA origin is visualized and normal. Bilateral AICA vessels are evident. The basilar artery is normal. Both posterior cerebral arteries originate the basilar tip. There is mild attenuation of distal PCA branch vessels.  IMPRESSION: 1. Signal changes compatible with acute/subacute hemorrhage in the right paramedian pons. No underlying mass lesion is evident. 2. Age advanced atrophy and white matter disease likely reflects the sequela of chronic microvascular ischemia. 3. Minimal diffusion abnormality in the medial high frontal lobes bilaterally is likely related to the adjacent calcification and probable meningioma. Punctate cortical infarcts in addition to the brainstem hemorrhage are considered less likely. 4. Mild distal small vessel disease without significant proximal stenosis, aneurysm, or branch vessel occlusion.   Electronically Signed   By: Lawrence Santiago M.D.   On: 10/03/2014 10:19   Mr Brain Wo Contrast  10/03/2014  CLINICAL DATA:  Acute onset of left-sided weakness and numbness with right-sided gaze. Abnormal CT scan with a focal brainstem hemorrhage.  EXAM: MRI HEAD WITHOUT CONTRAST  MRA HEAD WITHOUT CONTRAST  TECHNIQUE: Multiplanar, multiecho pulse sequences of the brain and surrounding structures were obtained without intravenous contrast. Angiographic images of the head were obtained using MRA technique without  contrast.  COMPARISON:  CT head without contrast 10/02/2014  FINDINGS: MRI HEAD FINDINGS  A focal area of susceptibility is evident within the right paramedian pons compatible with hemorrhage. There is surrounding edema. No discrete mass lesion is evident. There is intermediate to high T1 signal compatible with acute blood products. The fourth ventricle is intact. No additional hemorrhage is present.  3 punctate areas of focal diffusion signal abnormality are evident thumb height medial left frontal lobe, just above the cingulate gyrus. These are adjacent to an area of focal calcification, likely representing a small meningioma. No other diffusion abnormalities are present.  Mild generalized atrophy and white matter changes are present bilaterally, advanced for age.  Flow is present in the major intracranial arteries. The globes and orbits are intact. Midline structures are within normal limits. Mild mucosal thickening is present in the maxillary sinuses bilaterally.  MRA HEAD FINDINGS  The internal carotid arteries are within normal limits from the high cervical segments through the ICA termini bilaterally. The A1 and M1 segments are normal. Anterior communicating artery is patent. The MCA bifurcations are intact. There is some attenuation of distal small vessels bilaterally.  The right vertebral artery is the dominant vessel. There is mild irregularity of the distal left vertebral artery without a focal stenosis. The right PICA origin is visualized and normal. Bilateral AICA vessels are evident. The basilar artery is normal. Both posterior cerebral arteries originate the basilar tip. There is mild attenuation of distal PCA branch vessels.  IMPRESSION: 1. Signal changes compatible with acute/subacute hemorrhage in the right paramedian pons. No underlying mass lesion is evident. 2. Age advanced atrophy and white matter disease likely reflects the sequela of chronic microvascular ischemia. 3. Minimal diffusion  abnormality in the medial high frontal lobes bilaterally is likely related to the adjacent calcification and probable meningioma. Punctate cortical infarcts in addition to the brainstem hemorrhage are considered less likely. 4. Mild distal small vessel disease without significant proximal stenosis, aneurysm, or branch vessel occlusion.   Electronically Signed   By: Gennette Pac M.D.   On: 10/03/2014 10:19   Dg Chest Port 1 View  10/04/2014   CLINICAL DATA:  Intracranial hemorrhage.  EXAM: PORTABLE CHEST - 1 VIEW  COMPARISON:  10/03/2014.  FINDINGS: Mediastinum and hilar structures are normal. Cardiomegaly with mild pulmonary vascular prominence. Interim partial clearing of bilateral pulmonary edema. Small bilateral pleural effusions. No pneumothorax. No acute osseous abnormality .  IMPRESSION: Interim partial clearing of bilateral pulmonary edema.   Electronically Signed   By: Maisie Fus  Register   On: 10/04/2014 07:22   Dg Chest Port 1 View  10/03/2014   CLINICAL DATA:  Shortness of breath this morning.  EXAM: PORTABLE CHEST - 1 VIEW  COMPARISON:  09/29/2014  FINDINGS: The cardiac silhouette appears mildly enlarged. There is new pulmonary vascular congestion with interstitial and airspace opacities in the mid and lower lungs bilaterally. Both hemidiaphragms are obscured. Small bilateral pleural effusions are suspected. No pneumothorax is identified. No acute osseous abnormality is identified.  IMPRESSION: New pulmonary vascular congestion and bibasilar opacities consistent with pulmonary edema. Possible small bilateral pleural effusions.   Electronically Signed  By: Logan Bores   On: 10/03/2014 14:04    ASSESSMENT / PLAN:   Pulmonary Edema - in setting of CKD / HTN RLL Airspace Disease - pleural effusion in setting of CKD, HTN vs atx and poor inspiration on film  At Risk for Aspiration  Component of compensation for met acidosis R/o worsening MR  Plan: Push pulmonary hygiene: flutter valve  May  need to hold lasix with crt rise Follow up CXR in am to eval for edema further Improved clinically abg to assessment compensation and winters formula  Echo re assessment for mitral  Dysphagia   Plan: SLP following, for FEES NPO  maintain  Pontine ICH   Plan: Further imaging in am 12/2 per Neuro BP goal < 180 Alter meds to IV, dc oral   HTN - longstanding, pattern of LVH on CXR.  Difficulty obtaining medications as outpatient ?   Plan: Assess ECHO to r/o cardiac source edema  Hold lisinopril in setting of CKD  Will add IV labetolol Will likely need med assistance prior to d/c  CKD Metabolic acidosis (uremia) larger portion Small NONAG in setting of renal failure  Plan: Trend BMP bicarb  Add bicarb drip for NONAG correction with som increase rr Hold nephrotoxic agents Dc lasix Send bnp  To med floor, will follow as medical consultant further Address acidosis, bicarb ofr small NONAG, BP control fees, hold lasix  Lavon Paganini. Titus Mould, MD, [facility]: [facility]

## 2014-10-04 NOTE — Plan of Care (Signed)
Problem: Progression Outcomes Goal: Rehab Team goals identified Outcome: Completed/Met Date Met:  10/04/14

## 2014-10-04 NOTE — Progress Notes (Signed)
Physical Therapy Treatment Patient Details Name: [name] [name] MRN: [id number] DOB: [birth year] Today's Date: 10/04/2014    History of Present Illness Patient is a 53 y/o female admitted with left side weakness and gaze preference with BP 238/88.  CT showed acute pontine hemorrhagic infarct.  PMH positive for hyperlipidemia, HTN and compliance issues.    PT Comments    Patient had complaints of pain in her neck throughout session and was lethargic but agreeable to therapy.  Monitored HR during activity; reached 134 bpm at end of 104ft walk but came down quickly with seated rest.  Plan for eventual D/C to CIR remains appropriate.  Will continue to follow to maximize independence and functional mobility.   Follow Up Recommendations  CIR     Equipment Recommendations  Rolling walker with 5" wheels    Recommendations for Other Services       Precautions / Restrictions Precautions Precautions: Fall Restrictions Weight Bearing Restrictions: No    Mobility  Bed Mobility Overal bed mobility: Needs Assistance Bed Mobility: Rolling;Sidelying to Sit Rolling: Mod assist Sidelying to sit: Mod assist       General bed mobility comments: Pt requiring assistance to move legs off side of bed, elevate trunk to seated, and scooting to EOB.  Verbal cues throughout for hand position and sequencing.  Transfers Overall transfer level: Needs assistance Equipment used: Rolling walker (2 wheeled) Transfers: Sit to/from Stand Sit to Stand: Min assist Stand pivot transfers: Min assist       General transfer comment: sit-to-stand x1, stand-pivot x2; pt requiring min assist to power up from seated position and to acheive upright posture.  Verbal cues for positioning of LLE, hand position, and sequencing to stand.  Ambulation/Gait Ambulation/Gait assistance: Min assist Ambulation Distance (Feet): 60 Feet Assistive device: Rolling walker (2 wheeled) Gait Pattern/deviations: Step-to  pattern;Decreased stride length;Decreased step length - left;Ataxic   Gait velocity interpretation: Below normal speed for age/gender General Gait Details: Pt able to advance LLE and place it in an appropriate position with frequent verbal cues from therapist.  Min assist required to maintain balance.  Verbal cues for direction and posture.  Pt had one minor LOB but recovered independently.   Stairs            Wheelchair Mobility    Modified Rankin (Stroke Patients Only)       Balance Overall balance assessment: Needs assistance Sitting-balance support: Feet supported;No upper extremity supported Sitting balance-Leahy Scale: Fair Sitting balance - Comments: leans right  Postural control: Posterior lean;Right lateral lean Standing balance support: Bilateral upper extremity supported Standing balance-Leahy Scale: Poor                      Cognition Arousal/Alertness: Lethargic Behavior During Therapy: Flat affect Overall Cognitive Status: Impaired/Different from baseline Area of Impairment: Orientation;Attention;Safety/judgement Orientation Level: Disoriented to;Time Current Attention Level: Sustained     Safety/Judgement: Decreased awareness of deficits     General Comments: Pt responded to questions but did not initiate interactions; she was tired at the end of the session and had eyes closed when she was not conversing with therapy.    Exercises      General Comments        Pertinent Vitals/Pain Pain Assessment: 0-10 Pain Score: 8  Pain Location: Neck Pain Descriptors / Indicators: Sore Pain Intervention(s): Limited activity within patient's tolerance;Monitored during session;Repositioned (Pain remained unchanged throughout session)    Home Living  Prior Function            PT Goals (current goals can now be found in the care plan section) Progress towards PT goals: Progressing toward goals    Frequency  Min  4X/week    PT Plan Current plan remains appropriate    Co-evaluation             End of Session Equipment Utilized During Treatment: Gait belt Activity Tolerance: Patient tolerated treatment well;Patient limited by fatigue Patient left: in chair;with call bell/phone within reach;with family/visitor present     Time: 0981-1914 PT Time Calculation (min) (ACUTE ONLY): 35 min  Charges:                       G Codes:      Alic Hilburn SPT 10/04/2014, 12:01 PM

## 2014-10-04 NOTE — Progress Notes (Signed)
Occupational Therapy Treatment Patient Details Name: [name] [name] MRN: [id number] DOB: Feb 28, 1961 Today's Date: 10/04/2014    History of present illness Patient is a 53 y/o female admitted with left side weakness and gaze preference with BP 238/88.  CT showed acute pontine hemorrhagic infarct.  PMH positive for hyperlipidemia, HTN and compliance issues.   OT comments  Pt making steady progress. Pt very motivated to improved functional level of independence. Continue to recommend CIR. Will further assess vision next session.   Follow Up Recommendations  CIR    Equipment Recommendations  3 in 1 bedside comode    Recommendations for Other Services Rehab consult    Precautions / Restrictions Precautions Precautions: Fall       Mobility Bed Mobility Overal bed mobility: Needs Assistance Bed Mobility: Supine to Sit Rolling: Mod assist         General bed mobility comments: A to move legs off bed. Able to push trunk up to sitting with increased HOB  Transfers Overall transfer level: Needs assistance   Transfers: Stand Pivot Transfers;Sit to/from Stand Sit to Stand: Min assist Stand pivot transfers: Mod assist       General transfer comment: increased assistance to transfer toward weak L side    Balance Overall balance assessment: Needs assistance   Sitting balance-Leahy Scale: Fair Sitting balance - Comments: improved midline position today   Standing balance support: Bilateral upper extremity supported;During functional activity Standing balance-Leahy Scale: Poor Standing balance comment: unable to maintain midline postural control                   ADL Overall ADL's : Needs assistance/impaired Eating/Feeding: Minimal assistance;Sitting Eating/Feeding Details (indicate cue type and reason): using L hand to hold pudding cup with min A to prevent dropping                     Toilet Transfer: Moderate assistance;BSC;Stand-pivot   Toileting-  Clothing Manipulation and Hygiene: Moderate assistance;Sitting/lateral lean       Functional mobility during ADLs: Moderate assistance;Cueing for safety;Cueing for sequencing (stand pivot transfer)        Vision                     Perception     Praxis      Cognition   Behavior During Therapy: Flat affect Overall Cognitive Status: Impaired/Different from baseline Area of Impairment: Orientation;Attention;Safety/judgement;Problem solving;Awareness Orientation Level: Disoriented to;Time Current Attention Level: Selective      Safety/Judgement: Decreased awareness of safety;Decreased awareness of deficits Awareness: Emergent Problem Solving: Slow processing      Extremity/Trunk Assessment               Exercises     Shoulder Instructions       General Comments      Pertinent Vitals/ Pain       Pain Assessment: Faces Faces Pain Scale: Hurts little more Pain Location: all over Pain Descriptors / Indicators: Sore Pain Intervention(s): Monitored during session  Home Living                                          Prior Functioning/Environment              Frequency Min 3X/week     Progress Toward Goals  OT Goals(current goals can now be found in the care plan section)  Progress towards OT goals: Progressing toward goals  Acute Rehab OT Goals Patient Stated Goal: To return to independent OT Goal Formulation: With patient Time For Goal Achievement: 10/16/14 Potential to Achieve Goals: Good ADL Goals Pt Will Perform Eating: sitting;with supervision Pt Will Perform Grooming: with min guard assist;standing Pt Will Perform Upper Body Dressing: with supervision;sitting Pt Will Perform Lower Body Dressing: with min guard assist;sit to/from stand Pt Will Transfer to Toilet: with min guard assist;ambulating;bedside commode Pt Will Perform Toileting - Clothing Manipulation and hygiene: with min guard assist;sit to/from  stand Additional ADL Goal #1: Pt will locate ADL items and visual targets on L side with minimal verbal cues.  Plan Discharge plan remains appropriate    Co-evaluation                 End of Session Equipment Utilized During Treatment: Gait belt   Activity Tolerance Patient tolerated treatment well   Patient Left Other (comment) (w/c with nursing)   Nurse Communication Mobility status        Time: 4098-1191 OT Time Calculation (min): 29 min  Charges: OT General Charges $OT Visit: 1 Procedure OT Treatments $Self Care/Home Management : 23-37 mins  Ermine Spofford,HILLARY 10/04/2014, 4:08 PM   [facility], OTR/L  [phone] 10/04/2014

## 2014-10-04 NOTE — Procedures (Signed)
Objective Swallowing Evaluation: Fiberoptic Endoscopic Evaluation of Swallowing  Patient Details  Name: [name] [name] MRN: [id number] Date of Birth: 11-11-1960  Today's Date: 10/04/2014 Time: 1300-1400 SLP Time Calculation (min) (ACUTE ONLY): 60 min  Past Medical History:  Past Medical History  Diagnosis Date  . Hypertension   . Lupus   . CKD (chronic kidney disease)     due to lupus/Dr. Hyman Hopes  . Diabetes mellitus   . Stenosis of cervical spine region     with HNP at C5/6, C6/7  . Metatarsal bone fracture right    4th  . 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   Past Surgical History:  Past Surgical History  Procedure Laterality Date  . Breast biopsy     HPI:  Patient is a 53 y/o female admitted with left side weakness and gaze preference with BP 238/88.  CT showed acute pontine hemorrhagic infarct.  PMH positive for hyperlipidemia, HTN and compliance issues. Patient with new onset dysphagia 11/30 (patient reported) and increased lethargy. Repeat MRI complete 12/1 and indicated signal changes compatible with acute/subacute hemorrhage in the paramedian pons.     Assessment / Plan / Recommendation Clinical Impression  Dysphagia Diagnosis: Mild pharyngeal phase dysphagia  Clinical impression: Pt presents with mild pharyngeal dysphagia marked by penetration of thin liquids during consumption of large, consecutive boluses.  Pt with adequate sensation, and spontaneously cleared throat, which effectively expelled penetrate.  There was good bilateral mobility of vocal folds; standing secretions noted in larynx, leading to frequent throat-clearing and wet-sounding phonation at baseline.   Adequate pharyngeal clearance noted with all liquid and solid consistencies.  Pt reviewed video after completion of study; areas of concern identified for pt.  She agrees with recs for basic precautions and resumption of regular diet, thin liquids.   RR frequently increased to 40s during assessment - pt  cautioned to allow adequate time to breathe between PO boluses.  Will follow briefly to ensure tolerance/precautions.      Treatment Recommendation       Diet Recommendation Regular;Thin liquid   Liquid Administration via: Cup Medication Administration: Whole meds with puree Supervision: Patient able to self feed Compensations: Slow rate;Small sips/bites;Clear throat intermittently Postural Changes and/or Swallow Maneuvers: Seated upright 90 degrees    Other  Recommendations Oral Care Recommendations: Oral care BID   Follow Up Recommendations       Frequency and Duration min 1 x/week  1 week       SLP Swallow Goals     General Date of Onset: 09/28/14 HPI: Patient is a 53 y/o female admitted with left side weakness and gaze preference with BP 238/88.  CT showed acute pontine hemorrhagic infarct.  PMH positive for hyperlipidemia, HTN and compliance issues. Patient with new onset dysphagia 11/30 (patient reported) and increased lethargy. Repeat MRI complete 12/1 and indicated signal changes compatible with acute/subacute hemorrhage in the paramedian pons. Type of Study: Fiberoptic Endoscopic Evaluation of Swallowing Reason for Referral: Objectively evaluate swallowing function Previous Swallow Assessment: clinical swallow eval 12/2 am Diet Prior to this Study: NPO Temperature Spikes Noted: No Respiratory Status: Room air History of Recent Intubation: No Behavior/Cognition: Alert;Cooperative Oral Cavity - Dentition: Adequate natural dentition Oral Motor / Sensory Function: Within functional limits Self-Feeding Abilities: Able to feed self Patient Positioning: Upright in bed Volitional Cough: Strong Volitional Swallow: Able to elicit Anatomy: Within functional limits Pharyngeal Secretions: Standing secretions in (comment) (mild - in laryngeal vestibule)    Reason for Referral  Objectively evaluate swallowing function   Oral Phase Oral Preparation/Oral Phase Oral Phase: WFL    Pharyngeal Phase Pharyngeal Phase Pharyngeal Phase: Impaired Pharyngeal - Thin Pharyngeal - Thin Straw: Delayed swallow initiation;Reduced airway/laryngeal closure;Penetration/Aspiration after swallow;Penetration/Aspiration during swallow Penetration/Aspiration details (thin straw): Material enters airway, remains ABOVE vocal cords then ejected out  Cervical Esophageal Phase  [name] L. Samson Frederic, [place] CCC/SLP Pager [phone]               Blenda Mounts [name] 10/04/2014, 2:12 PM

## 2014-10-04 NOTE — Progress Notes (Signed)
Speech Language Pathology Treatment: Dysphagia  Patient Details Name: [name] [name] MRN: [id number] DOB: 1961-10-15 Today's Date: 10/04/2014 Time: 1749-4496 SLP Time Calculation (min) (ACUTE ONLY): 12 min  Assessment / Plan / Recommendation Clinical Impression  Diagnostic treatment complete with focus on swallowing function. Patient remains mildly lethargic however improved from 12/1 with ability to maintain alert state with minimal verbal cueing. Po trials provided. Patient continues to demonstrate signs of a neurogenic dysphagia characterized by wet vocal quality at baseline suggestive of penetration and/or aspiration of secretions as well as  evidence of decreased airway protection across consistencies with throat clearing and coughing post swallow. Presence of dysphagia not surprising given location of CVA (pontine) although not documented as present initially after admit or during initial SLP cognitive evaluation. Question if patient with mild dysphagia resulting from acute CVA not detected on RN stroke swallow screen which has now exacerbated with acute medical changes including renal failure and pulmonary edema as head CT remains stable if not improved from admission. Discussed with CCM and neuro MD. Plan to complete FEES this pm to instrumentally assess swallow function as aspiration risk appears to remain high.    HPI HPI: Patient is a 53 y/o female admitted with left side weakness and gaze preference with BP 238/88.  CT showed acute pontine hemorrhagic infarct.  PMH positive for hyperlipidemia, HTN and compliance issues. Patient with new onset dysphagia 11/30 (patient reported) and increased lethargy. Repeat MRI complete 12/1 and indicated signal changes compatible with acute/subacute hemorrhage in the   Pertinent Vitals Pain Assessment: No/denies pain (reports improving throat pain with swallow)  SLP Plan   (FEES)    Recommendations Diet recommendations: NPO Medication Administration:  Via alternative means              Oral Care Recommendations:  (QID) Follow up Recommendations: Inpatient Rehab Plan:  (FEES)    Mack MA, [name] [phone]  [name] 10/04/2014, 10:12 AM

## 2014-10-04 NOTE — Patient Care Conference (Signed)
Pt bp 220/84 hr 121, paged Dr. Leonie Man

## 2014-10-04 NOTE — Progress Notes (Signed)
  Echocardiogram 2D Echocardiogram has been performed.  [name] [name] 10/04/2014, 10:04 AM

## 2014-10-04 NOTE — Progress Notes (Signed)
Rehab admissions - Not medically ready for acute inpatient rehab admission today.  Will follow progress daily.  Call me for questions.  #[phone]

## 2014-10-04 NOTE — Progress Notes (Signed)
Pt systollic BP 680, gave labetalol 10 mg IV for bp.  Pt asymptomatic to bp.

## 2014-10-04 NOTE — Progress Notes (Signed)
Re-paged Dr. Leonie Man for pt Bp 220/84 hr 118 at this time.

## 2014-10-05 ENCOUNTER — Inpatient Hospital Stay (HOSPITAL_COMMUNITY)
Admission: RE | Admit: 2014-10-05 | Discharge: 2014-10-10 | DRG: 057 | Disposition: A | Discharge status: Home / Self Care | Payer: Managed Care, Other (non HMO) | Source: Intra-hospital | Attending: Physical Medicine & Rehabilitation | Admitting: Physical Medicine & Rehabilitation

## 2014-10-05 ENCOUNTER — Inpatient Hospital Stay (HOSPITAL_COMMUNITY): Payer: Managed Care, Other (non HMO)

## 2014-10-05 DIAGNOSIS — D631 Anemia in chronic kidney disease: Secondary | ICD-10-CM | POA: Diagnosis present

## 2014-10-05 DIAGNOSIS — G8194 Hemiplegia, unspecified affecting left nondominant side: Secondary | ICD-10-CM | POA: Diagnosis present

## 2014-10-05 DIAGNOSIS — E119 Type 2 diabetes mellitus without complications: Secondary | ICD-10-CM | POA: Diagnosis present

## 2014-10-05 DIAGNOSIS — D72829 Elevated white blood cell count, unspecified: Secondary | ICD-10-CM | POA: Diagnosis not present

## 2014-10-05 DIAGNOSIS — G8929 Other chronic pain: Secondary | ICD-10-CM | POA: Diagnosis present

## 2014-10-05 DIAGNOSIS — M329 Systemic lupus erythematosus, unspecified: Secondary | ICD-10-CM | POA: Diagnosis present

## 2014-10-05 DIAGNOSIS — R531 Weakness: Secondary | ICD-10-CM | POA: Diagnosis present

## 2014-10-05 DIAGNOSIS — E785 Hyperlipidemia, unspecified: Secondary | ICD-10-CM | POA: Diagnosis present

## 2014-10-05 DIAGNOSIS — R6510 Systemic inflammatory response syndrome (SIRS) of non-infectious origin without acute organ dysfunction: Secondary | ICD-10-CM | POA: Diagnosis present

## 2014-10-05 DIAGNOSIS — A419 Sepsis, unspecified organism: Secondary | ICD-10-CM | POA: Diagnosis present

## 2014-10-05 DIAGNOSIS — I129 Hypertensive chronic kidney disease with stage 1 through stage 4 chronic kidney disease, or unspecified chronic kidney disease: Secondary | ICD-10-CM | POA: Diagnosis present

## 2014-10-05 DIAGNOSIS — Z79899 Other long term (current) drug therapy: Secondary | ICD-10-CM

## 2014-10-05 DIAGNOSIS — N189 Chronic kidney disease, unspecified: Secondary | ICD-10-CM

## 2014-10-05 DIAGNOSIS — N184 Chronic kidney disease, stage 4 (severe): Secondary | ICD-10-CM

## 2014-10-05 DIAGNOSIS — D62 Acute posthemorrhagic anemia: Secondary | ICD-10-CM | POA: Diagnosis present

## 2014-10-05 DIAGNOSIS — F17210 Nicotine dependence, cigarettes, uncomplicated: Secondary | ICD-10-CM | POA: Diagnosis present

## 2014-10-05 DIAGNOSIS — N179 Acute kidney failure, unspecified: Secondary | ICD-10-CM | POA: Diagnosis present

## 2014-10-05 DIAGNOSIS — J810 Acute pulmonary edema: Secondary | ICD-10-CM

## 2014-10-05 DIAGNOSIS — M436 Torticollis: Secondary | ICD-10-CM | POA: Diagnosis present

## 2014-10-05 DIAGNOSIS — I613 Nontraumatic intracerebral hemorrhage in brain stem: Secondary | ICD-10-CM | POA: Diagnosis present

## 2014-10-05 DIAGNOSIS — I10 Essential (primary) hypertension: Secondary | ICD-10-CM | POA: Diagnosis present

## 2014-10-05 DIAGNOSIS — I69054 Hemiplegia and hemiparesis following nontraumatic subarachnoid hemorrhage affecting left non-dominant side: Secondary | ICD-10-CM | POA: Diagnosis not present

## 2014-10-05 DIAGNOSIS — Z7982 Long term (current) use of aspirin: Secondary | ICD-10-CM | POA: Diagnosis not present

## 2014-10-05 DIAGNOSIS — J9601 Acute respiratory failure with hypoxia: Secondary | ICD-10-CM

## 2014-10-05 DIAGNOSIS — D649 Anemia, unspecified: Secondary | ICD-10-CM | POA: Diagnosis present

## 2014-10-05 DIAGNOSIS — E875 Hyperkalemia: Secondary | ICD-10-CM | POA: Diagnosis present

## 2014-10-05 LAB — COMPREHENSIVE METABOLIC PANEL
ALK PHOS: 83 U/L (ref 39–117)
ALT: 7 U/L (ref 0–35)
ANION GAP: 16 — AB (ref 5–15)
AST: 12 U/L (ref 0–37)
Albumin: 1.9 g/dL — ABNORMAL LOW (ref 3.5–5.2)
BUN: 50 mg/dL — AB (ref 6–23)
CALCIUM: 8.9 mg/dL (ref 8.4–10.5)
CO2: 15 mEq/L — ABNORMAL LOW (ref 19–32)
CREATININE: 2.86 mg/dL — AB (ref 0.50–1.10)
Chloride: 108 mEq/L (ref 96–112)
GFR calc Af Amer: 21 mL/min — ABNORMAL LOW (ref >90)
GFR calc non Af Amer: 18 mL/min — ABNORMAL LOW (ref >90)
Glucose, Bld: 114 mg/dL — ABNORMAL HIGH (ref 70–99)
POTASSIUM: 4 meq/L (ref 3.7–5.3)
Sodium: 139 mEq/L (ref 137–147)
TOTAL PROTEIN: 6.6 g/dL (ref 6.0–8.3)
Total Bilirubin: 0.3 mg/dL (ref 0.3–1.2)

## 2014-10-05 LAB — URINALYSIS, ROUTINE W REFLEX MICROSCOPIC
BILIRUBIN URINE: NEGATIVE
Glucose, UA: 100 mg/dL — AB
KETONES UR: NEGATIVE mg/dL
Leukocytes, UA: NEGATIVE
Nitrite: NEGATIVE
Protein, ur: >300 mg/dL — AB
Specific Gravity, Urine: 1.016 (ref 1.005–1.030)
Urobilinogen, UA: 0.2 mg/dL (ref 0.0–1.0)
pH: 6 (ref 5.0–8.0)

## 2014-10-05 LAB — PRO B NATRIURETIC PEPTIDE: Pro B Natriuretic peptide (BNP): 20673 pg/mL — ABNORMAL HIGH (ref 0–125)

## 2014-10-05 LAB — CBC WITH DIFFERENTIAL/PLATELET
Basophils Absolute: 0 10*3/uL (ref 0.0–0.1)
Basophils Relative: 0 % (ref 0–1)
Eosinophils Absolute: 0.1 10*3/uL (ref 0.0–0.7)
Eosinophils Relative: 1 % (ref 0–5)
HCT: 23.8 % — ABNORMAL LOW (ref 36.0–46.0)
HEMOGLOBIN: 7.7 g/dL — AB (ref 12.0–15.0)
Lymphocytes Relative: 9 % — ABNORMAL LOW (ref 12–46)
Lymphs Abs: 0.8 10*3/uL (ref 0.7–4.0)
MCH: 26 pg (ref 26.0–34.0)
MCHC: 32.4 g/dL (ref 30.0–36.0)
MCV: 80.4 fL (ref 78.0–100.0)
MONO ABS: 0.6 10*3/uL (ref 0.1–1.0)
MONOS PCT: 7 % (ref 3–12)
NEUTROS ABS: 7.3 10*3/uL (ref 1.7–7.7)
NEUTROS PCT: 83 % — AB (ref 43–77)
Platelets: 213 10*3/uL (ref 150–400)
RBC: 2.96 MIL/uL — ABNORMAL LOW (ref 3.87–5.11)
RDW: 14 % (ref 11.5–15.5)
WBC: 8.8 10*3/uL (ref 4.0–10.5)

## 2014-10-05 LAB — GLUCOSE, CAPILLARY: GLUCOSE-CAPILLARY: 146 mg/dL — AB (ref 70–99)

## 2014-10-05 LAB — URINE MICROSCOPIC-ADD ON

## 2014-10-05 MED ORDER — MYCOPHENOLATE MOFETIL 250 MG PO CAPS
1000.0000 mg | ORAL_CAPSULE | Freq: Two times a day (BID) | ORAL | Status: DC
Start: 2014-10-05 — End: 2014-10-10
  Administered 2014-10-05 – 2014-10-10 (×10): 1000 mg via ORAL
  Filled 2014-10-05 (×12): qty 4

## 2014-10-05 MED ORDER — BISACODYL 10 MG RE SUPP: 10.0000 mg | Freq: Every day | RECTAL | Status: DC | PRN

## 2014-10-05 MED ORDER — AMLODIPINE BESYLATE 10 MG PO TABS
10.0000 mg | ORAL_TABLET | Freq: Every day | ORAL | Status: DC
  Administered 2014-10-06 – 2014-10-10 (×5): 10 mg via ORAL
  Filled 2014-10-05 (×6): qty 1

## 2014-10-05 MED ORDER — ALPRAZOLAM 0.25 MG PO TABS: 0.5000 mg | ORAL_TABLET | Freq: Four times a day (QID) | ORAL | Status: DC | PRN

## 2014-10-05 MED ORDER — FERROUS SULFATE 325 (65 FE) MG PO TABS
325.0000 mg | ORAL_TABLET | Freq: Every day | ORAL | Status: DC
Start: 2014-10-06 — End: 2014-10-10
  Administered 2014-10-06 – 2014-10-10 (×5): 325 mg via ORAL
  Filled 2014-10-05 (×6): qty 1

## 2014-10-05 MED ORDER — OXYCODONE HCL 5 MG PO TABS
5.0000 mg | ORAL_TABLET | ORAL | Status: DC | PRN
  Administered 2014-10-05 – 2014-10-06 (×4): 10 mg via ORAL
  Administered 2014-10-07: 5 mg via ORAL
  Administered 2014-10-07 – 2014-10-09 (×3): 10 mg via ORAL
  Administered 2014-10-09 (×2): 5 mg via ORAL
  Administered 2014-10-10: 10 mg via ORAL
  Filled 2014-10-05 (×2): qty 1
  Filled 2014-10-05 (×3): qty 2
  Filled 2014-10-05: qty 1
  Filled 2014-10-05 (×6): qty 2
  Filled 2014-10-05 (×2): qty 1

## 2014-10-05 MED ORDER — POLYETHYLENE GLYCOL 3350 17 G PO PACK
17.0000 g | PACK | Freq: Every day | ORAL | Status: DC | PRN
  Filled 2014-10-05: qty 1

## 2014-10-05 MED ORDER — SENNOSIDES-DOCUSATE SODIUM 8.6-50 MG PO TABS
1.0000 | ORAL_TABLET | Freq: Two times a day (BID) | ORAL | Status: DC
  Administered 2014-10-05 – 2014-10-10 (×10): 1 via ORAL
  Filled 2014-10-05 (×14): qty 1

## 2014-10-05 MED ORDER — ENSURE COMPLETE PO LIQD
237.0000 mL | Freq: Two times a day (BID) | ORAL | Status: DC
  Administered 2014-10-06 – 2014-10-09 (×6): 237 mL via ORAL

## 2014-10-05 MED ORDER — MENTHOL 3 MG MT LOZG
1.0000 | LOZENGE | OROMUCOSAL | Status: DC | PRN
  Filled 2014-10-05: qty 9

## 2014-10-05 MED ORDER — ATORVASTATIN CALCIUM 20 MG PO TABS
20.0000 mg | ORAL_TABLET | Freq: Every day | ORAL | Status: DC
Start: 2014-10-05 — End: 2014-10-10
  Administered 2014-10-05 – 2014-10-09 (×5): 20 mg via ORAL
  Filled 2014-10-05 (×6): qty 1

## 2014-10-05 MED ORDER — CYCLOBENZAPRINE HCL 10 MG PO TABS
5.0000 mg | ORAL_TABLET | Freq: Three times a day (TID) | ORAL | Status: DC | PRN
  Administered 2014-10-05 – 2014-10-10 (×2): 5 mg via ORAL
  Filled 2014-10-05 (×2): qty 1

## 2014-10-05 MED ORDER — PHENOL 1.4 % MT LIQD
1.0000 | OROMUCOSAL | Status: DC | PRN
  Filled 2014-10-05: qty 177

## 2014-10-05 MED ORDER — ACETAMINOPHEN 325 MG PO TABS
325.0000 mg | ORAL_TABLET | ORAL | Status: DC | PRN
  Administered 2014-10-06 – 2014-10-10 (×3): 650 mg via ORAL
  Filled 2014-10-05 (×3): qty 2

## 2014-10-05 MED ORDER — ONDANSETRON HCL 4 MG/2ML IJ SOLN: 4.0000 mg | Freq: Four times a day (QID) | INTRAMUSCULAR | Status: DC | PRN

## 2014-10-05 MED ORDER — LABETALOL HCL 200 MG PO TABS
400.0000 mg | ORAL_TABLET | Freq: Two times a day (BID) | ORAL | Status: DC
  Administered 2014-10-05 – 2014-10-10 (×10): 400 mg via ORAL
  Filled 2014-10-05 (×12): qty 2

## 2014-10-05 MED ORDER — NICOTINE 7 MG/24HR TD PT24
7.0000 mg | MEDICATED_PATCH | Freq: Every day | TRANSDERMAL | Status: DC
  Administered 2014-10-06 – 2014-10-10 (×5): 7 mg via TRANSDERMAL
  Filled 2014-10-05 (×6): qty 1

## 2014-10-05 MED ORDER — ONDANSETRON HCL 4 MG PO TABS: 4.0000 mg | ORAL_TABLET | Freq: Four times a day (QID) | ORAL | Status: DC | PRN

## 2014-10-05 MED ORDER — FLEET ENEMA 7-19 GM/118ML RE ENEM: 1.0000 | ENEMA | Freq: Once | RECTAL | Status: AC | PRN

## 2014-10-05 MED ORDER — ALUM & MAG HYDROXIDE-SIMETH 200-200-20 MG/5ML PO SUSP: 30.0000 mL | ORAL | Status: DC | PRN

## 2014-10-05 MED ORDER — VITAMIN D3 25 MCG (1000 UNIT) PO TABS
5000.0000 [IU] | ORAL_TABLET | ORAL | Status: DC
  Administered 2014-10-06 – 2014-10-09 (×2): 5000 [IU] via ORAL
  Filled 2014-10-05 (×3): qty 5

## 2014-10-05 MED ORDER — GUAIFENESIN-DM 100-10 MG/5ML PO SYRP: 5.0000 mL | ORAL_SOLUTION | Freq: Four times a day (QID) | ORAL | Status: DC | PRN

## 2014-10-05 MED ORDER — ADULT MULTIVITAMIN W/MINERALS CH
1.0000 | ORAL_TABLET | Freq: Every day | ORAL | Status: DC
  Administered 2014-10-06 – 2014-10-09 (×4): 1 via ORAL
  Filled 2014-10-05 (×5): qty 1

## 2014-10-05 NOTE — H&P (Signed)
Physical Medicine and Rehabilitation Admission H&P   Chief Complaint  Patient presents with  . Left sided weakness, high level cognitive deficits and left inattention   HPI: [name] [name] is a 53 y.o. female with history of HTN, hyperlipidemia, lupus with mild LLE weakness as well as compliance issues who was admitted on 09/28/14 with acute onset left sided weakness and numbness as well as gaze to the right side. Patient reported being out of BP meds for 3 weeks and BP 238/88 at admission. UDS negative. 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 Carotid dopplers significant for R-40-59% ICA stenosis. ST evaluation with high level cognitive deficits affecting STM as well as solving/reasoning especially in math area. Blood pressure remains labile requiring Cardene drip, neck, back and ankle pain as well as issues with fluctuating bouts of lethargy. Follow up MRI/MRA brain done today revealing changes c/w acute/subacute hemorrhage in right parmedian pons and no underlying mass evident. Minimal diffusion abnormality in high frontal lobes bilaterally likely due to calcification and probably meningioma.  She has worsening of respiratory status on 12/01 with difficulty handling secretions and complaints of severe throat pain. She was made NPO and CXR was ordered revealing fluid overload. CCM was consulted for input and patient was treated with IV diuresis and has had improvement in symptoms. Lisinopril was dicontinued due to of renal status and she was treated with IV bicarb for acidosis. Repeat cardiac echo showed EF 60-70% with AV sclerosis without stenosis. FEES done yesterday showing mild dysphagia with pentration due to large boluses but no aspiration therefore she was started on regular textures with thin liquids. Patient with resultant left sided weakness impacting  mobility as well as safety. Therapy ongoing and CIR recommended by rehab team and MD.    Review of Systems  HENT: Negative for hearing loss.  Eyes: Negative for blurred vision and double vision.  Respiratory: Negative for shortness of breath.  Cardiovascular: Negative for chest pain and palpitations.  Gastrointestinal: Negative for heartburn, nausea, abdominal pain and constipation.   Throat feels better  Genitourinary: Negative for urgency and frequency.  Musculoskeletal: Positive for myalgias, back pain, joint pain (chronic bilateral ankle pain) and neck pain.  Neurological: Positive for weakness. Negative for dizziness and headaches.  Psychiatric/Behavioral: The patient has insomnia.      Past Medical History  Diagnosis Date  . Hypertension   . Lupus   . CKD (chronic kidney disease)     due to lupus/Dr. [name]  . Diabetes mellitus   . Stenosis of cervical spine region     with HNP at C5/6, C6/7  . Metatarsal bone fracture right    4th  . 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   Past Surgical History  Procedure Laterality Date  . Breast biopsy     Family History  Problem Relation Age of Onset  . Hypertension    . Lupus    . Rheum arthritis    . Hypertension Mother   . Diabetes Mother   . Hypertension Sister   . Diabetes Father   . Hypertension Maternal Grandmother    Social History: Lives with son and daughter. 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 Has a boyfriend who can also assist past discharge. She reports that she has been smoking Cigarettes--about 1/2 PPD. She has a 15 pack-year smoking history. She does not  have any smokeless tobacco history on file. She reports that she drink alcohol couple of times a year. She does not use illicit drugs.    Allergies  Allergen Reactions  . Sulfa Antibiotics Itching   Medications Prior to Admission  Medication Sig Dispense  Refill  . amLODipine (NORVASC) 10 MG tablet take 1 tablet by mouth once daily 30 tablet 0  . aspirin 81 MG tablet Take 81 mg by mouth daily.    . Cholecalciferol (VITAMIN D3) 50000 UNITS CAPS Take 1 capsule by mouth 3 (three) times a week. 30 capsule 3  . ferrous sulfate 325 (65 FE) MG tablet Take 325 mg by mouth daily with breakfast.    . furosemide (LASIX) 20 MG tablet Take 20 mg by mouth 2 (two) times daily.    Marland Kitchen labetalol (NORMODYNE) 200 MG tablet Take 200 mg by mouth 2 (two) times daily.     Marland Kitchen lisinopril (PRINIVIL,ZESTRIL) 20 MG tablet Take 20 mg by mouth daily.    . multivitamin (RENA-VIT) TABS tablet Take 1 tablet by mouth daily.    . mycophenolate (CELLCEPT) 250 MG capsule Take 1,000 mg by mouth 2 (two) times daily.    . traMADol (ULTRAM) 50 MG tablet Take 50 mg by mouth 2 (two) times daily as needed for moderate pain.     . nicotine (NICODERM CQ - DOSED IN MG/24 HOURS) 14 mg/24hr patch Place 1 patch onto the skin daily.    . reserpine 0.1 MG TABS Take 0.1 mg by mouth daily.    Marland Kitchen spironolactone (ALDACTONE) 25 MG tablet Take 1 tablet (25 mg total) by mouth daily. (Patient not taking: Reported on 09/28/2014) 90 tablet 1    Home: [place] expects to be discharged to:: Private residence Living Arrangements: Children Available Help at Discharge: Family, Available 24 hours/day Type of Home: House Home Access: Stairs to enter [employer] of Steps: 4 Entrance Stairs-Rails: Right Home Layout: Two level, Able to live on main level with bedroom/bathroom Home Equipment: Walker - standard  Functional History: Prior Function Level of Independence: Independent 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[employer]:  Mobility: Bed Mobility Overal bed mobility: Needs Assistance Bed Mobility: Supine to Sit Rolling: Mod assist Sidelying to sit: Mod assist Supine to sit: Mod assist, HOB  elevated Sit to supine: Mod assist General bed mobility comments: A to move legs off bed. Able to push trunk up to sitting with increased HOB Transfers Overall transfer level: Needs assistance Equipment used: Rolling walker (2 wheeled) Transfers: Stand Pivot Transfers, Sit to/from Stand Sit to Stand: Min assist Stand pivot transfers: Mod assist General transfer comment: increased assistance to transfer toward weak L side Ambulation/Gait Ambulation/Gait assistance: Min assist Ambulation Distance (Feet): 60 Feet Assistive device: Rolling walker (2 wheeled) Gait Pattern/deviations: Step-to pattern, Decreased stride length, Decreased step length - left, Ataxic Gait velocity interpretation: Below normal speed for age/gender General Gait Details: Pt able to advance LLE and place it in an appropriate position with frequent verbal cues from therapist. Min assist required to maintain balance. Verbal cues for direction and posture. Pt had one minor LOB but recovered independently.    ADL: ADL Overall ADL's : Needs assistance/impaired Eating/Feeding: Minimal assistance, Sitting Eating/Feeding Details (indicate cue type and reason): using L hand to hold pudding cup with min A to prevent dropping Grooming: Wash/dry hands, Wash/dry face, Moderate assistance, Sitting Grooming Details (indicate cue type and reason): decreased thoroughness and task persistence Upper Body Bathing: Moderate assistance, Sitting Lower Body  Bathing: Maximal assistance, Sit to/from stand Upper Body Dressing : Moderate assistance, Sitting Lower Body Dressing: Maximal assistance, Sit to/from stand Toilet Transfer: Moderate assistance, BSC, Stand-pivot Toilet Transfer Details (indicate cue type and reason): simulated to chair Toileting- Clothing Manipulation and Hygiene: Moderate assistance, Sitting/lateral lean Functional mobility during ADLs: Moderate assistance, Cueing for safety, Cueing for sequencing (stand pivot  transfer) General ADL Comments: Pt with lethargy interfering with ability to perform ADL.  Cognition: Cognition Overall Cognitive Status: Impaired/Different from baseline Arousal/Alertness: Awake/alert Orientation Level: Oriented X4 Attention: Selective Selective Attention: Appears intact Memory: Impaired Memory Impairment: Retrieval deficit, Decreased short term memory Decreased Short Term Memory: Verbal basic Awareness: Appears intact Problem Solving: Impaired Problem Solving Impairment: Verbal complex Executive Function: 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, trade [employer]) Reasoning: Impaired Reasoning Impairment: Verbal complex Safety/Judgment: Appears intact Cognition Arousal/Alertness: Awake/alert Behavior During Therapy: Flat affect Overall Cognitive Status: Impaired/Different from baseline Area of Impairment: Orientation, Attention, Safety/judgement, Problem solving, Awareness Orientation Level: Disoriented to, Time Current Attention Level: Selective Safety/Judgement: Decreased awareness of safety, Decreased awareness of deficits Awareness: Emergent Problem Solving: Slow processing General Comments: Pt responded to questions but did not initiate interactions; she was tired at the end of the session and had eyes closed when she was not conversing with therapy.  Physical Exam: Blood pressure 146/65, pulse 84, temperature 98.9 F (37.2 C), temperature source Oral, resp. rate 18, height 5' 5.5" (1.664 m), weight 64.6 kg (142 lb 6.7 oz), last menstrual period 07/18/2012, SpO2 100 %.  Constitutional: She is oriented to person, place, and time. She appears well-developed and well-nourished.  Lying in bed with head turned to the right. 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  HENT:  Head: Normocephalic and atraumatic.  Blistered areas on tip of tongue--non tender  Eyes: Conjunctivae are normal. Pupils are equal, round, and reactive to  light.  Neck: Muscular tenderness present. Decreased range of motion present.  Cardiovascular: Regular rhythm. Tachycardia present.  Respiratory: Effort normal. She has decreased breath sounds in the right lower field and the left lower field.  GI: Soft. Bowel sounds are normal.  Musculoskeletal: She exhibits no edema or tenderness.  Neurological: She is oriented to person, place, and time.  Left inattention. Able to state day, month as well as medical questions. Able to follow basic commands without difficulty. Subjective RUE weakness --poor effort. LLE weakness with dysesthesias. Sensation 1/2 LUE and LLE with PP and LT. Left PD. Emerging tone LUE. Has poor awareness and insight into deficits.  Skin: Skin is warm and dry.  Psychiatric: Her speech is normal. Her affect is blunt. She is inattentive.     Lab Results Last 48 Hours    Results for orders placed or performed during the hospital encounter of 09/28/14 (from the past 48 hour(s))  Basic metabolic panel Status: Abnormal   Collection Time: 10/04/14 2:58 AM  Result Value Ref Range   Sodium 143 137 - 147 mEq/L   Potassium 4.5 3.7 - 5.3 mEq/L   Chloride 111 96 - 112 mEq/L   CO2 11 (L) 19 - 32 mEq/L   Glucose, Bld 75 70 - 99 mg/dL   BUN 46 (H) 6 - 23 mg/dL   Creatinine, Ser 3.21 (H) 0.50 - 1.10 mg/dL   Calcium 9.1 8.4 - 10.5 mg/dL   GFR calc non Af Amer 16 (L) >90 mL/min   GFR calc Af Amer 18 (L) >90 mL/min    Comment: (NOTE) The eGFR has been  calculated using the CKD EPI equation. This calculation has not been validated in all clinical situations. eGFR's persistently <90 mL/min signify possible Chronic Kidney Disease.    Anion gap 21 (H) 5 - 15  Magnesium Status: None   Collection Time: 10/04/14 2:58 AM  Result Value Ref Range   Magnesium 1.8 1.5 - 2.5 mg/dL  CBC Status: Abnormal   Collection Time: 10/04/14 2:58 AM  Result Value Ref  Range   WBC 13.0 (H) 4.0 - 10.5 K/uL   RBC 3.23 (L) 3.87 - 5.11 MIL/uL   Hemoglobin 8.5 (L) 12.0 - 15.0 g/dL   HCT 26.0 (L) 36.0 - 46.0 %   MCV 80.5 78.0 - 100.0 fL   MCH 26.3 26.0 - 34.0 pg   MCHC 32.7 30.0 - 36.0 g/dL   RDW 14.0 11.5 - 15.5 %   Platelets 205 150 - 400 K/uL  Lactic acid, plasma Status: None   Collection Time: 10/04/14 10:00 AM  Result Value Ref Range   Lactic Acid, Venous 0.5 0.5 - 2.2 mmol/L  Blood gas, arterial Status: Abnormal   Collection Time: 10/04/14 12:00 PM  Result Value Ref Range   FIO2 0.21 %   Delivery systems ROOM AIR    pH, Arterial 7.318 (L) 7.350 - 7.450   pCO2 arterial 24.4 (L) 35.0 - 45.0 mmHg   pO2, Arterial 82.5 80.0 - 100.0 mmHg   Bicarbonate 12.2 (L) 20.0 - 24.0 mEq/L   TCO2 12.9 0 - 100 mmol/L   Acid-base deficit 12.8 (H) 0.0 - 2.0 mmol/L   O2 Saturation 94.6 %   Patient temperature 98.6    Collection site LEFT RADIAL    Drawn by [phone]    Sample type ARTERIAL DRAW    Allens test (pass/fail) PASS PASS  Comprehensive metabolic panel Status: Abnormal   Collection Time: 10/05/14 5:21 AM  Result Value Ref Range   Sodium 139 137 - 147 mEq/L   Potassium 4.0 3.7 - 5.3 mEq/L   Chloride 108 96 - 112 mEq/L   CO2 15 (L) 19 - 32 mEq/L   Glucose, Bld 114 (H) 70 - 99 mg/dL   BUN 50 (H) 6 - 23 mg/dL   Creatinine, Ser 2.86 (H) 0.50 - 1.10 mg/dL   Calcium 8.9 8.4 - 10.5 mg/dL   Total Protein 6.6 6.0 - 8.3 g/dL   Albumin 1.9 (L) 3.5 - 5.2 g/dL   AST 12 0 - 37 U/L   ALT 7 0 - 35 U/L   Alkaline Phosphatase 83 39 - 117 U/L   Total Bilirubin 0.3 0.3 - 1.2 mg/dL   GFR calc non Af Amer 18 (L) >90 mL/min   GFR calc Af Amer 21 (L) >90 mL/min    Comment: (NOTE) The eGFR has been calculated using the CKD EPI equation. This calculation has not been validated in  all clinical situations. eGFR's persistently <90 mL/min signify possible Chronic Kidney Disease.    Anion gap 16 (H) 5 - 15  CBC with Differential Status: Abnormal   Collection Time: 10/05/14 5:21 AM  Result Value Ref Range   WBC 8.8 4.0 - 10.5 K/uL   RBC 2.96 (L) 3.87 - 5.11 MIL/uL   Hemoglobin 7.7 (L) 12.0 - 15.0 g/dL   HCT 23.8 (L) 36.0 - 46.0 %   MCV 80.4 78.0 - 100.0 fL   MCH 26.0 26.0 - 34.0 pg   MCHC 32.4 30.0 - 36.0 g/dL   RDW 14.0 11.5 - 15.5 %   Platelets 213 150 -  400 K/uL   Neutrophils Relative % 83 (H) 43 - 77 %   Neutro Abs 7.3 1.7 - 7.7 K/uL   Lymphocytes Relative 9 (L) 12 - 46 %   Lymphs Abs 0.8 0.7 - 4.0 K/uL   Monocytes Relative 7 3 - 12 %   Monocytes Absolute 0.6 0.1 - 1.0 K/uL   Eosinophils Relative 1 0 - 5 %   Eosinophils Absolute 0.1 0.0 - 0.7 K/uL   Basophils Relative 0 0 - 1 %   Basophils Absolute 0.0 0.0 - 0.1 K/uL  Pro b natriuretic peptide (BNP) Status: Abnormal   Collection Time: 10/05/14 5:21 AM  Result Value Ref Range   Pro B Natriuretic peptide (BNP) 20673.0 (H) 0 - 125 pg/mL      Imaging Results (Last 48 hours)    Ct Head Wo Contrast  10/04/2014 CLINICAL DATA: Follow-up exam for intracranial hemorrhage, left-sided weakness EXAM: CT HEAD WITHOUT CONTRAST TECHNIQUE: Contiguous axial images were obtained from the base of the skull through the vertex without intravenous contrast. COMPARISON: Prior MRI from 10/14/2014 and CT from 10/02/2014. FINDINGS: There has been continued interval evolution of right paramedian hemorrhagic focus within the pons, which is slightly less dense in appearance as compared to prior CT from 10/02/2014. This hemorrhage measures or size at 9-10 mm AP, and 7 mm transverse. This is slightly decreased in size. No significant edema. The adjacent fourth ventricle and basilar cisterns remain widely patent. No  hydrocephalus. No new intracranial hemorrhage or infarct. Chronic microvascular ischemic changes again noted. No midline shift. No extra-axial fluid collection. No acute abnormality seen about the orbits. Scalp soft tissues within normal limits. Calvarium intact. Paranasal sinuses and mastoid air cells remain clear IMPRESSION: 1. Continued interval evolution of resolving parenchymal hemorrhage within the right paramedian pons, which is slightly decreased in size and less dense as compared to 10/02/14. No hydrocephalus. 2. No new intracranial process. 3. Chronic microvascular ischemic disease. Electronically Signed By: Jeannine Boga M.D. On: 10/04/2014 05:41   Dg Chest Port 1 View  10/05/2014 CLINICAL DATA: Acute respiratory acidosis. EXAM: PORTABLE CHEST - 1 VIEW COMPARISON: 10/04/2014 FINDINGS: No residual pulmonary edema. Probable small effusions with mild basilar atelectasis. Cardiac silhouette is mildly enlarged. Normal mediastinal and hilar contours. No pneumothorax. IMPRESSION: Resolved pulmonary edema. Probable small residual pleural effusions with mild basilar atelectasis. Electronically Signed By: Lajean Manes M.D. On: 10/05/2014 08:00   Dg Chest Port 1 View  10/04/2014 CLINICAL DATA: Intracranial hemorrhage. EXAM: PORTABLE CHEST - 1 VIEW COMPARISON: 10/03/2014. FINDINGS: Mediastinum and hilar structures are normal. Cardiomegaly with mild pulmonary vascular prominence. Interim partial clearing of bilateral pulmonary edema. Small bilateral pleural effusions. No pneumothorax. No acute osseous abnormality . IMPRESSION: Interim partial clearing of bilateral pulmonary edema. Electronically Signed By: Marcello Moores Register On: 10/04/2014 07:22   Dg Chest Port 1 View  10/03/2014 CLINICAL DATA: Shortness of breath this morning. EXAM: PORTABLE CHEST - 1 VIEW COMPARISON: 09/29/2014 FINDINGS: The cardiac silhouette appears mildly enlarged. There is new pulmonary  vascular congestion with interstitial and airspace opacities in the mid and lower lungs bilaterally. Both hemidiaphragms are obscured. Small bilateral pleural effusions are suspected. No pneumothorax is identified. No acute osseous abnormality is identified. IMPRESSION: New pulmonary vascular congestion and bibasilar opacities consistent with pulmonary edema. Possible small bilateral pleural effusions. Electronically Signed By: Logan Bores On: 10/03/2014 14:04        Medical Problem List and Plan: 1. 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 DVT Prophylaxis/Anticoagulation: Mechanical: Sequential compression devices, below knee Bilateral lower extremities 3. Chronic pain/Pain Management: Alternate heat with ice to neck in addition to oxycodone prn. Therapy to work on ROM.  4. Mood: More alert today. LCSW to follow for evaluation and support.  5. Neuropsych: This patient is capable of making decisions on his own behalf. 6. Skin/Wound Care: Routine pressure relief measures.  7. Fluids/Electrolytes/Nutrition: Monitor I/O. Will add supplements to help with hydration and intake. Check lyes in am.  8. HTN: Monitor qid with SBP goal below 180. Continue lasix, Normodyne and Norvasc. Monitor for hypotensive episode as medication reach steady state.  9. Lupus: Has not seen rheum (Dr. Mearl Latin) for the past year and question compliance with medications. Continue Cellcept bid.  10. Acute on chronic CKD: Baseline BUN/Cr- 38/3.20 at admission. Encourage po fluid intake.  11. ABLA: Has had drop in Hgb 11.6--> 9.5-->8.5-->7.7. Continue iron supplement. Check stool guaiacs.  12. Dyslipidemia: On Lipitor daily.  13. Medical non-compliance: Counsel/Educate patient on importance of adhering to medication regimen.  14. Leucocytosis: Likely reactive. Will check UA/UCS.    Post Admission Physician  Evaluation: 1. Functional deficits secondary to right paramedian pontine infarct with left hemiparesis and sensory deficits.  2. Patient is admitted to receive collaborative, interdisciplinary care between the physiatrist, rehab nursing staff, and therapy team. 3. Patient's level of medical complexity and substantial therapy needs in context of that medical necessity cannot be provided at a lesser intensity of care such as a SNF. 4. Patient has experienced substantial functional loss from his/her baseline which was documented above under the "Functional History" and "Functional Status" headings. Judging by the patient's diagnosis, physical exam, and functional history, the patient has potential for functional progress which will result in measurable gains while on inpatient rehab. These gains will be of substantial and practical use upon discharge in facilitating mobility and self-care at the household level. 5. Physiatrist will provide 24 hour management of medical needs as well as oversight of the therapy plan/treatment and provide guidance as appropriate regarding the interaction of the two. 6. 24 hour rehab nursing will assist with bladder management, bowel management, safety, skin/wound care, disease management, medication administration, pain management and patient education and help integrate therapy concepts, techniques,education, etc. 7. PT will assess and treat for/with: Lower extremity strength, range of motion, stamina, balance, functional mobility, safety, adaptive techniques and equipment, NMR, cognitive perceptual rx, pain mgt, ego support. Goals are: mod I to supervision. 8. OT will assess and treat for/with: ADL's, functional mobility, safety, upper extremity strength, adaptive techniques and equipment, NMR, cognitive perceptual awareness, leisure awareness, community reintegration. Goals are: mod I to supervision. Therapy may proceed with showering this patient. 9. SLP will  assess and treat for/with: speech, swallowing, communication. Goals are: mod I. 10. Case Management and Social Worker will assess and treat for psychological issues and discharge planning. 11. Team conference will be held weekly to assess progress toward goals and to determine barriers to discharge. 12. Patient will receive at least 3 hours of therapy per day at least 5 days per week. 13. ELOS: 10-15 days  14. Prognosis: good     Meredith Staggers, MD, [facility] 10/05/2014

## 2014-10-05 NOTE — Progress Notes (Signed)
PMR Admission Coordinator Pre-Admission Assessment  Patient: [name] [name] is an 53 y.o., female MRN: [id number] DOB: 14-Mar-1961 Height: 5' 5.5" (166.4 cm) Weight: 64.6 kg (142 lb 6.7 oz)  Insurance Information HMO: PPO: PCP: IPA: 80/20: OTHER:  PRIMARY: [employer] managed Policy#: [id number] Subscriber: Tresa Res CM Name: Rayetta Humphrey Phone#: [phone] Fax#: [phone] Pre-Cert#: [id number] Employer: [employer] Benefits: Phone #: [phone] Name: [name]. Date: 12/04/12 X 7 days with update on 10/10/14 Deduct: $1500 Out of Pocket Max: $3500.00 Life Max: unlimited CIR: 80% w/auth SNF: 80% w/auth and 100 days max Outpatient: 36 visits/yr Co-Pay: $50 copay Home Health: 80% Co-Pay: 20% DME: 80% Co-Pay: 20% Providers: in network  Emergency Contact Information Contact Information    Name Relation Home Work Mobile   [place] Son [phone] [place] other [phone]  [phone]   Fauteux,Brittany Daughter   [phone]   Melea, Prezioso   [phone]     Current Medical History  Patient Admitting Diagnosis: R pontine infarct  History of Present Illness: A 53 y.o. female with history of HTN, hyperlipidemia, lupus with mild LLE weakness as well as compliance issues who was admitted on 09/28/14 with acute onset left sided weakness and numbness as well as gaze to the right side. Patient reported being out of BP meds for 3 weeks and BP 238/88 at admission. UDS negative. 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 Carotid dopplers  significant for R-40-59% ICA stenosis. ST evaluation with high level cognitive deficits affecting STM as well as solving/reasoning especially in math area. Blood pressure remains labile requiring Cardene drip, neck, back and ankle pain as well as issues with fluctuating bouts of lethargy. Follow up MRI/MRA brain done today revealing changes c/w acute/subacute hemorrhage in right parmedian pons and no underlying mass evident. Minimal diffusion abnormality in high frontal lobes bilaterally likely due to calcification and probably meningioma.  She had worsening of respiratory status on 12/01 with difficulty handling secretions and complaints of severe throat pain. Admit to inpatient rehab on hold 12/01. She was made NPO and CXR done revealing fluid overload. CCM was consulted for input and patient was treated with IV diuresis with improvement in symptoms. Lisinopril placed on hold due to woresing of renal status. Repeat cardiac echo showed EF 60-70% with AV sclerosis without stenosis. Patient with resultant left sided weakness impacting mobility as well as safety. Therapy ongoing and CIR recommended by rehab team and MD.   Total: 2=NIH  Past Medical History  Past Medical History  Diagnosis Date  . Hypertension   . Lupus   . CKD (chronic kidney disease)     due to lupus/Dr. [name]  . Diabetes mellitus   . Stenosis of cervical spine region     with HNP at C5/6, C6/7  . Metatarsal bone fracture right    4th  . 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    Family History  family history includes Diabetes in her father and mother; Hypertension in her maternal grandmother, mother, sister, and another family member; Lupus in an other family member; Rheum arthritis in an other family member.  Prior Rehab/Hospitalizations: None  Current Medications  Current facility-administered medications: acetaminophen (TYLENOL) tablet 650 mg, 650 mg, Oral, Q4H PRN, 650 mg  at 10/01/14 0552 **OR** acetaminophen (TYLENOL) suppository 650 mg, 650 mg, Rectal, Q4H PRN, Wallie Char; ALPRAZolam Duanne Moron)  tablet 0.5 mg, 0.5 mg, Oral, Q6H PRN, Wallie Char, 0.5 mg at 09/30/14 2143; amLODipine (NORVASC) tablet 10 mg, 10 mg, Oral, Daily, Antony Contras, MD, 10 mg at 10/05/14 1019 atorvastatin (LIPITOR) tablet 20 mg, 20 mg, Oral, q1800, Melvenia Beam, MD, 20 mg at 10/04/14 2213; cholecalciferol (VITAMIN D) tablet 5,000 Units, 5,000 Units, Oral, Once per day on Mon Wed Fri, Pramod Sethi, MD, 5,000 Units at 10/02/14 1539; fentaNYL (SUBLIMAZE) injection 12.5 mcg, 12.5 mcg, Intravenous, Q2H PRN, Raylene Miyamoto, MD, 12.5 mcg at 10/04/14 1220 ferrous sulfate tablet 325 mg, 325 mg, Oral, Q breakfast, Antony Contras, MD, 325 mg at 10/05/14 1020; hydrALAZINE (APRESOLINE) injection 10 mg, 10 mg, Intravenous, Q4H PRN, Amie Portland, MD, 10 mg at 10/03/14 2001; labetalol (NORMODYNE) tablet 400 mg, 400 mg, Oral, BID, Melvenia Beam, MD, 400 mg at 10/05/14 1019; labetalol (NORMODYNE,TRANDATE) injection 10 mg, 10 mg, Intravenous, Q4H PRN, Raylene Miyamoto, MD, 10 mg at 10/04/14 1652 menthol-cetylpyridinium (CEPACOL) lozenge 3 mg, 1 lozenge, Oral, PRN, Donzetta Starch, NP, 3 mg at 10/02/14 1552; multivitamin with minerals tablet 1 tablet, 1 tablet, Oral, Q1500, Antony Contras, MD, 1 tablet at 10/04/14 1421; mycophenolate (CELLCEPT) capsule 1,000 mg, 1,000 mg, Oral, BID, Antony Contras, MD, 1,000 mg at 10/05/14 1020 nicotine (NICODERM CQ - dosed in mg/24 hr) patch 7 mg, 7 mg, Transdermal, Daily, Antony Contras, MD, 7 mg at 10/05/14 1020; oxyCODONE-acetaminophen (PERCOCET/ROXICET) 5-325 MG per tablet 1 tablet, 1 tablet, Oral, Q6H PRN, Melvenia Beam, MD, 1 tablet at 10/05/14 1019; phenol (CHLORASEPTIC) mouth spray 1 spray, 1 spray, Mouth/Throat, PRN, Rush Farmer, MD senna-docusate (Senokot-S) tablet 1 tablet, 1 tablet, Oral, BID, Wallie Char, 1 tablet at 10/05/14 1019  Patients  Current Diet: Diet Heart  Precautions / Restrictions Precautions Precautions: Fall Restrictions Weight Bearing Restrictions: No   Prior Activity Level Community (5-7x/wk): Went out daily. 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   Home Assistive Devices / Equipment Home Assistive Devices/Equipment: Kasandra Knudsen (specify quad or straight) (pt stated she uses it when her arthritis flares up) Home Equipment: Walker - standard  Prior Functional Level Prior Function Level of Independence: Independent 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[employer]  Current Functional Level Cognition  Arousal/Alertness: Awake/alert Overall Cognitive Status: Impaired/Different from baseline Current Attention Level: Selective Orientation Level: Oriented X4 Safety/Judgement: Decreased awareness of safety, Decreased awareness of deficits General Comments: Pt responded to questions but did not initiate interactions; she was tired at the end of the session and had eyes closed when she was not conversing with therapy. Attention: Selective Selective Attention: Appears intact Memory: Impaired Memory Impairment: Retrieval deficit, Decreased short term memory Decreased Short Term Memory: Verbal basic Awareness: Appears intact Problem Solving: Impaired Problem Solving Impairment: Verbal complex Executive Function: 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, trade union) Reasoning: Impaired Reasoning Impairment: Verbal complex Safety/Judgment: Appears intact   Extremity Assessment (includes Sensation/Coordination)  Upper Extremity Assessment: LUE deficits/detail LUE Deficits / Details: AROM WFL, strength shoulder flexion 4/5, elbow flexion 4/5, extension Lower Extremity Assessment: LLE deficits/detail LLE Deficits / Details: AROM WFL except ankle DF limited due to strength/coordination, strength Hip flexion 4-/5, knee extension 4/5, knee flexion 3+/5, ankle DF 2-/5 Cervical / Trunk Assessment: Other exceptions   ADLs  Overall ADL's : Needs  assistance/impaired Eating/Feeding: Minimal assistance, Sitting Eating/Feeding Details (indicate cue type and reason): using L hand to hold pudding cup with min A to prevent dropping Grooming: Wash/dry hands, Wash/dry face, Moderate assistance, Sitting Grooming Details (indicate cue type and reason): decreased thoroughness and  task persistence Upper Body Bathing: Moderate assistance, Sitting Lower Body Bathing: Maximal assistance, Sit to/from stand Upper Body Dressing : Moderate assistance, Sitting Lower Body Dressing: Maximal assistance, Sit to/from stand Toilet Transfer: Moderate assistance, BSC, Stand-pivot Toilet Transfer Details (indicate cue type and reason): simulated to chair Toileting- Clothing Manipulation and Hygiene: Moderate assistance, Sitting/lateral lean Functional mobility during ADLs: Moderate assistance, Cueing for safety, Cueing for sequencing (stand pivot transfer) General ADL Comments: Pt with lethargy interfering with ability to perform ADL.    Mobility  Overal bed mobility: Needs Assistance Bed Mobility: Supine to Sit Rolling: Mod assist Sidelying to sit: Mod assist Supine to sit: Mod assist, HOB elevated Sit to supine: Mod assist General bed mobility comments: A to move legs off bed. Able to push trunk up to sitting with increased HOB    Transfers  Overall transfer level: Needs assistance Equipment used: Rolling walker (2 wheeled) Transfers: Stand Pivot Transfers, Sit to/from Stand Sit to Stand: Min assist Stand pivot transfers: Mod assist General transfer comment: increased assistance to transfer toward weak L side    Ambulation / Gait / Stairs / Wheelchair Mobility  Ambulation/Gait Ambulation/Gait assistance: Min assist Ambulation Distance (Feet): 60 Feet Assistive device: Rolling walker (2 wheeled) Gait Pattern/deviations: Step-to pattern, Decreased stride length, Decreased step length - left, Ataxic Gait velocity interpretation: Below normal  speed for age/gender General Gait Details: Pt able to advance LLE and place it in an appropriate position with frequent verbal cues from therapist. Min assist required to maintain balance. Verbal cues for direction and posture. Pt had one minor LOB but recovered independently.    Posture / Balance Dynamic Sitting Balance Sitting balance - Comments: improved midline position today    Special needs/care consideration BiPAP/CPAP No CPM No Continuous Drip IV No Dialysis No  Life Vest No Oxygen NO Special Bed No Trach Size No Wound Vac (area) No  Skin NO  Bowel mgmt: Last BM 10/05/14 Bladder mgmt: Voiding WDL. Incontinent episode last night. Diabetic mgmt Yes, on oral medications at home    Previous Home Environment Living Arrangements: Children Available Help at Discharge: Family, Available 24 hours/day Type of Home: House Home Layout: Two level, Able to live on main level with bedroom/bathroom Home Access: Stairs to enter Entrance Stairs-Rails: Right Entrance Stairs-Number of Steps: 4 Bathroom Shower/Tub: Multimedia programmer: Handicapped height [place]: No  Discharge [place] for Discharge Living Setting: Lives with (comment), Apartment (Lives with 2 sons and 1 dtr.) Type of Home at Discharge: Apartment Discharge Home Layout: Two level, Able to live on main level with bedroom/bathroom Alternate Level Stairs-Number of Steps: Flight Discharge Home Access: Stairs to enter Entrance Stairs-Number of Steps: 5 step entry Does the patient have any problems obtaining your medications?: Yes (Describe)  Social/Family/Support Systems Patient Roles: Parent (Has 2 sons and 1 daughter.) Contact Information: Gerda Yin - son [phone] Anticipated Caregiver: Robin Searing - Rodney's father Anticipated Caregiver's Contact Information: Delfino Lovett - [phone] Ability/Limitations of Caregiver: Children all  work. Richard, Rodney's father, not working and can stay with patient as needed. Caregiver Availability: Other (Comment) (Can have someone stay while children working as needed.) Discharge Plan Discussed with Primary Caregiver: Yes Is Caregiver In Agreement with Plan?: Yes Does Caregiver/Family have Issues with Lodging/Transportation while Pt is in Rehab?: No  Goals/Additional Needs Patient/Family Goal for Rehab: PT/OTST mod I goals Expected length of stay: 7-10 days Cultural Considerations: None Dietary Needs: Heart, thin liquids Equipment Needs: TBD Pt/Family Agrees to Admission and willing  to participate: Yes Program Orientation Provided & Reviewed with Pt/Caregiver Including Roles & Responsibilities: Yes  Decrease burden of Care through IP rehab admission: N/A  Possible need for SNF placement upon discharge: Not planned  Patient Condition: This patient's condition remains as documented in the consult dated 10/02/14, in which the Rehabilitation Physician determined and documented that the patient's condition is appropriate for intensive rehabilitative care in an inpatient rehabilitation facility. Note: Held admission on 12/02 to inpatient rehab due to pulmonary edema, fever, decline in status. See above history update. See by Dr. Naaman Plummer today and cleared for acute inpatient rehab admission. Currently requiring min assist to ambulate 60 ft, min assist transfers, mod assist toilet transfers. Will admit to acute inpatient rehab today.  Preadmission Screen Completed By: Retta Diones, 10/05/2014 11:07 AM ______________________________________________________________________  Discussed status with Dr. Naaman Plummer on 10/05/14 at 1107 and received telephone approval for admission today.  Admission Coordinator: Retta Diones, time1123/Date12/03/15          Cosigned by: Meredith Staggers, MD at 10/05/2014 12:22 PM

## 2014-10-05 NOTE — Progress Notes (Signed)
Physical Medicine and Rehabilitation Consult  Reason for Consult: Left sided weakness, high level cognitive deficits and left inattention.  Referring Physician: Dr. Leonie Man.    HPI: [name] [name] is a 53 y.o. female with history of HTN, hyperlipidemia, lupus with mild LLE weakness as well as compliance issues who was admitted on 09/28/14 with acute onset left sided weakness and numbness as well as gaze to the right side. Patient reported being out of BP meds for 3 weeks and BP 238/88 at admission. UDS negative. 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 Carotid dopplers significant for R-40-59% ICA stenosis. ST evaluation with high level cognitive deficits affecting STM as well as solving/reasoning especially in math area. Blood pressure remains labile requiring Cardene drip, neck/back pain as well as issues with lethargy. Patient with resultant left sided weakness impacting mobility as well as safety. Therapy ongoing and CIR recommended by rehab team and MD.    Review of Systems  HENT: Negative for hearing loss.  Eyes: Negative for blurred vision.  Respiratory: Negative for cough and shortness of breath.  Cardiovascular: Negative for chest pain and palpitations.  Gastrointestinal: Negative for heartburn and nausea.  Musculoskeletal: Positive for myalgias, joint pain (left ankle pain) and neck pain.  Neurological: Positive for dizziness, sensory change (pins and neddles LLE), focal weakness, weakness and headaches.  Psychiatric/Behavioral: The patient is nervous/anxious. The patient does not have insomnia.     Past Medical History  Diagnosis Date  . Hypertension   . Lupus   . CKD (chronic kidney disease)     due to lupus/Dr. [name]  . Diabetes mellitus   . Stenosis of cervical spine region     with HNP at C5/6, C6/7  .  Metatarsal bone fracture right    4th    Past Surgical History  Procedure Laterality Date  . Breast biopsy        Family History  Problem Relation Age of Onset  . Hypertension    . Lupus    . Rheum arthritis    . Hypertension Mother   . Diabetes Mother   . Hypertension Sister   . Diabetes Father   . Hypertension Maternal Grandmother     Social History:  reports that she has been smoking Cigarettes. She has a 15 pack-year smoking history. She does not have any smokeless tobacco history on file. She reports that she does not drink alcohol or use illicit drugs.    Allergies  Allergen Reactions  . Sulfa Antibiotics Itching   Medications Prior to Admission  Medication Sig Dispense Refill  . amLODipine (NORVASC) 10 MG tablet take 1 tablet by mouth once daily 30 tablet 0  . aspirin 81 MG tablet Take 81 mg by mouth daily.    . Cholecalciferol (VITAMIN D3) 50000 UNITS CAPS Take 1 capsule by mouth 3 (three) times a week. 30 capsule 3  . ferrous sulfate 325 (65 FE) MG tablet Take 325 mg by mouth daily with breakfast.    . furosemide (LASIX) 20 MG tablet Take 20 mg by mouth 2 (two) times daily.    Marland Kitchen labetalol (NORMODYNE) 200 MG tablet Take 200 mg by mouth 2 (two) times daily.     Marland Kitchen lisinopril (PRINIVIL,ZESTRIL) 20 MG tablet Take 20 mg by mouth daily.    . multivitamin (RENA-VIT) TABS tablet Take 1 tablet by mouth daily.    Marland Kitchen  mycophenolate (CELLCEPT) 250 MG capsule Take 1,000 mg by mouth 2 (two) times daily.    . traMADol (ULTRAM) 50 MG tablet Take 50 mg by mouth 2 (two) times daily as needed for moderate pain.     . nicotine (NICODERM CQ - DOSED IN MG/24 HOURS) 14 mg/24hr patch Place 1 patch onto the skin daily.    . reserpine 0.1 MG TABS Take 0.1 mg by mouth daily.    Marland Kitchen spironolactone (ALDACTONE) 25 MG tablet Take 1 tablet (25 mg total) by mouth daily.  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 Transfers Sit to Stand: Min assist Stand pivot transfers: Min assist General transfer comment: cues for hand placement and sequence. Increased cueing and assist with pivot left due to decreased control of LLE with knee extension and increased hip flexion Ambulation/Gait Ambulation/Gait assistance: Min assist Ambulation Distance (Feet): 60 Feet Assistive device: Rolling walker (2 wheeled) Gait Pattern/deviations: Step-to pattern, Decreased stance time - left, Narrow base of support Gait velocity interpretation: Below normal speed for age/gender General Gait Details: Pt with narrow BOS without scissoring and maintaining left foot flat today with decreased knee flexion and short stride length with cues for sequence and RW use    ADL:    Cognition: Cognition Overall Cognitive Status: Impaired/Different from baseline Arousal/Alertness: Awake/alert Orientation Level: Oriented X4 Attention: Selective Selective Attention: Appears intact Memory:  Impaired Memory Impairment: Retrieval deficit, Decreased short term memory Decreased Short Term Memory: Verbal basic Awareness: Appears intact Problem Solving: Impaired Problem Solving Impairment: Verbal complex Executive Function: 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, trade union) Reasoning: Impaired Reasoning Impairment: Verbal complex Safety/Judgment: Appears intact Cognition Arousal/Alertness: Awake/alert Behavior During Therapy: Flat affect Overall Cognitive Status: Impaired/Different from baseline Area of Impairment: Orientation, Safety/judgement Orientation Level: Time Safety/Judgement: Decreased awareness of deficits  Blood pressure 163/66, pulse 96, temperature 98.2 F (36.8 C), temperature source Oral, resp. rate 18, height 5' 5.5" (1.664 m), weight 64.6 kg (142 lb 6.7 oz), last menstrual period 07/18/2012, SpO2 95 %. Physical Exam  Nursing note and vitals reviewed. Constitutional: She is oriented to person, place, and time. She appears well-developed and well-nourished. She appears lethargic. She is easily aroused. Nasal cannula in place.  HENT:  Head: Normocephalic and atraumatic.  Eyes: Conjunctivae are normal. Pupils are equal, round, and reactive to light.  Neck: Normal range of motion. Neck supple.  Cardiovascular: Regular rhythm. Tachycardia present.  Respiratory: Effort normal. She has wheezes.  GI: Soft. Bowel sounds are normal. She exhibits no distension. There is no tenderness.  Musculoskeletal:  2+ edema bilateral hands  Neurological: She is oriented to person, place, and time and easily aroused. She appears lethargic.  Anxious appearing. Needs cues to keep eyes open. Left inattention. Able to state day, month as  well as medical questions. Able to follow basic commands without difficulty. Subjective RUE weakness --poor effort. LLE weakness with dysesthesias. Sensation 1/2 LUE and LLE with PP and LT. Left PD.  Skin: Skin is warm and dry.  Psychiatric:  Affect flat, follows  basic commands.     Lab Results Last 24 Hours    Results for orders placed or performed during the hospital encounter of 09/28/14 (from the past 24 hour(s))  Basic metabolic panel Status: Abnormal   Collection Time: 10/02/14 4:54 AM  Result Value Ref Range   Sodium 138 137 - 147 mEq/L   Potassium 4.9 3.7 - 5.3 mEq/L   Chloride 111 96 - 112 mEq/L   CO2 11 (L) 19 - 32 mEq/L   Glucose, Bld 95 70 - 99 mg/dL   BUN 33 (H) 6 - 23 mg/dL   Creatinine, Ser 2.80 (H) 0.50 - 1.10 mg/dL   Calcium 8.3 (L) 8.4 - 10.5 mg/dL   GFR calc non Af Amer 18 (L) >90 mL/min   GFR calc Af Amer 21 (L) >90 mL/min   Anion gap 16 (H) 5 - 15  CBC Status: Abnormal   Collection Time: 10/02/14 4:54 AM  Result Value Ref Range   WBC 12.5 (H) 4.0 - 10.5 K/uL   RBC 3.21 (L) 3.87 - 5.11 MIL/uL   Hemoglobin 8.4 (L) 12.0 - 15.0 g/dL   HCT 26.6 (L) 36.0 - 46.0 %   MCV 82.9 78.0 - 100.0 fL   MCH 26.2 26.0 - 34.0 pg   MCHC 31.6 30.0 - 36.0 g/dL   RDW 14.6 11.5 - 15.5 %   Platelets 163 150 - 400 K/uL      Imaging Results (Last 48 hours)    No results found.    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 Does the need for close, 24 hr/day medical supervision in concert with the patient's rehab needs make it unreasonable for this patient to be served in a less intensive setting? Yes 2. Co-Morbidities requiring supervision/potential complications: htn, dm, ra 3. Due to bladder management, bowel management, safety, skin/wound care, disease management, medication administration, pain management and patient education, does the patient require 24 hr/day rehab nursing? Yes 4. Does the patient require coordinated care of a physician, rehab nurse, PT (1-2 hrs/day, 5 days/week), OT (1-2 hrs/day, 5 days/week) and SLP (1-2 hrs/day, 5 days/week)  to address physical and functional deficits in the context of the above medical diagnosis(es)? Yes Addressing deficits in the following areas: balance, endurance, locomotion, strength, transferring, bowel/bladder control, bathing, dressing, feeding, grooming, toileting, cognition, speech and psychosocial support 5. Can the patient actively participate in [name] intensive therapy program of at least 3 hrs of therapy per day at least 5 days per week? Yes 6. The potential for patient to make measurable gains while on inpatient rehab is excellent 7. Anticipated functional outcomes upon discharge from inpatient rehab are modified independent with PT, modified independent with OT, modified independent with SLP. 8. Estimated rehab length of stay to reach the above functional goals is: 7-10 days 9. Does the patient have adequate social supports and living environment to accommodate these discharge functional goals? Yes 10. Anticipated D/C setting: Home 11. Anticipated post D/C treatments: [place] therapy 12. Overall Rehab/Functional Prognosis: excellent  RECOMMENDATIONS: This patient's condition is appropriate for continued rehabilitative care in the following setting: CIR Patient has agreed to participate in recommended program. Yes Note that insurance prior authorization may be required  for reimbursement for recommended care.  Comment: Rehab Admissions Coordinator to follow up.  Thanks,  Meredith Staggers, MD, [facility]     10/02/2014       Revision History

## 2014-10-05 NOTE — Progress Notes (Addendum)
Pt transferred to [facility]. Report called to  Flo Shanks RN. Per pt report, valuables had be sent home with family.

## 2014-10-05 NOTE — Plan of Care (Signed)
Problem: SLP Dysphagia Goals Goal: Patient will demonstrate readiness for PO's Patient will demonstrate readiness for PO's and/or instrumental swallow study as evidenced by:  Outcome: Completed/Met Date Met:  10/05/14

## 2014-10-05 NOTE — Plan of Care (Signed)
Problem: Discharge/Transitional Outcomes Goal: Family/Caregiver willing and able to support plan Family/Caregiver willing and able to support plan for self-management after transition home  Outcome: Progressing

## 2014-10-05 NOTE — Progress Notes (Signed)
Rehab admissions - Noted Hgb down to 7.7 today and has trended down over over past several days.  RN and MD are aware of Hgb.  Once cleared medically, I hope to be able to admit to acute inpatient rehab today.  Call me for questions.  #[phone]

## 2014-10-05 NOTE — Care Management Note (Signed)
CARE MANAGEMENT NOTE 10/05/2014  Patient:  [name] [name], [name] [name]   Account Number:  [id number]  Date Initiated:  10/03/2014  Documentation initiated by:  Sandi Mariscal  Subjective/Objective Assessment:   hemorrhagic CVA; Nicardipine gtt until 11/30     Action/Plan:   therapy evals recommending CIR; Josem Kaufmann is in progress for transfer  10/05/2014 Pt to d/c to CIR for rehab.   Anticipated DC Date:  10/05/2014   Anticipated DC Plan:  IP REHAB FACILITY      DC Planning Services  CM consult      Choice offered to / List presented to:             Status of service:  Completed, signed off Medicare Important Message given?  NA - LOS <3 / Initial given by admissions (If response is "NO", the following Medicare IM given date fields will be blank) Date Medicare IM given:   Medicare IM given by:   Date Additional Medicare IM given:   Additional Medicare IM given by:    Discharge Disposition:  IP REHAB FACILITY  Per UR Regulation:  Reviewed for med. necessity/level of care/duration of stay  If discussed at [place] of Stay Meetings, dates discussed:   10/03/2014    Comments:

## 2014-10-05 NOTE — Progress Notes (Signed)
Notified MD at [phone] of current Hg of 7.7. Will continue to monitor pt closely.

## 2014-10-05 NOTE — Progress Notes (Signed)
   Name: [name] [name] MRN: [id number] DOB: 11/13/1960    ADMISSION DATE:  09/28/2014 CONSULTATION DATE:  10/03/14  REFERRING MD :  Dr. Leonie Man   CHIEF COMPLAINT:  Pulmonary Edema   BRIEF PATIENT DESCRIPTION: 53 y/o F admitted 11/26 after an acute pontine hemorrhage.  Patient was progressing and planned for discharge 12/1 but d/c was held due to concerns of pulmonary edema / respiratory status changes.  She was tx to ICU and PCCM consulted for evaluation.  Diuresed with improvement.    SIGNIFICANT EVENTS  11/26  Admit with acute pontine hemorrhage.  Stopped anti-hypertensive's 3 weeks PTA.  12/01  Planned d/c to CIR, concerns for pulmonary edema & d/c held.  PCCM consulted  STUDIES:  11/26  CT Head >> acute hemorrhage within the pons, chronic microvascular disease 11/30  CT Head >> decrease of hemorrhage size 12/01  MRI Head >> acute / subacute hemorrhage in R paramedian pons, advanced age atrophy   SUBJECTIVE:  Denies SOB, chest pain.  Eating breakfast without difficulty.   VITAL SIGNS: Temp:  [98.7 F (37.1 C)-99.8 F (37.7 C)] 98.9 F (37.2 C) (12/03 0459) Pulse Rate:  [84-120] 84 (12/03 0459) Resp:  [18-36] 18 (12/03 0459) BP: (146-206)/(65-121) 146/65 mmHg (12/03 0459) SpO2:  [97 %-100 %] 100 % (12/03 0459)  PHYSICAL EXAMINATION: General:  Adult female, NAD eating breakfast  Neuro:  Awake, alert, appropriate, LUE weakness, uses RUE freely HEENT:  Mm pink/moist Cardiovascular:  s1s2 rrr, tachy mild Lungs:  NO SOB, cta  Abdomen:  Round/soft, bsx4 active  Musculoskeletal:  No acute deformities  Skin:  Warm/dry, no edema    Recent Labs Lab 10/02/14 0454 10/04/14 0258 10/05/14 0521  NA 138 143 139  K 4.9 4.5 4.0  CL 111 111 108  CO2 11* 11* 15*  BUN 33* 46* 50*  CREATININE 2.80* 3.21* 2.86*  GLUCOSE 95 75 114*    Recent Labs Lab 10/02/14 0454 10/04/14 0258 10/05/14 0521  HGB 8.4* 8.5* 7.7*  HCT 26.6* 26.0* 23.8*  WBC 12.5* 13.0* 8.8  PLT 163 205 213    CXR:  Resolving pulmonary edema   ASSESSMENT / PLAN:  Pulmonary Edema, resolved RLL Airspace Disease, resolved  REC: Optimize RX for dCHF  Dysphagia  Per Stroke team   Pontine ICH  Per Stroke team   HTN 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 Please call if we can be of further assistance  Nickolas Madrid, NP 10/05/2014  9:28 AM Pager: [phone] or [phone]  Merton Border, MD ; [facility] service Mobile [phone].  After 5:30 PM or weekends, call [phone]

## 2014-10-05 NOTE — Progress Notes (Signed)
Speech Language Pathology Treatment: Dysphagia  Patient Details Name: [name] [name] MRN: [id number] DOB: Jan 02, 1961 Today's Date: 10/05/2014 Time: 9024-0973 SLP Time Calculation (min) (ACUTE ONLY): 11 min  Assessment / Plan / Recommendation Clinical Impression  F/u after 12/2 FEES: Pt today with diminished clinical symptoms of dysphagia; better secretion management.  Following basic precautions, pt consumed regular solids, thin liquids with no s/s of penetration/aspiration, + use of intermittent throat-clear.  Recommend continuing regular diet, thin liquids.  No f/u for swallow warranted.  Continue tx higher-level cognitive goals and f/u on CIR.    HPI HPI: Patient is a 53 y/o female admitted with left side weakness and gaze preference with BP 238/88.  CT showed acute pontine hemorrhagic infarct.  PMH positive for hyperlipidemia, HTN and compliance issues. Patient with new onset dysphagia 11/30 (patient reported) and increased lethargy. Repeat MRI complete 12/1 and indicated signal changes compatible with acute/subacute hemorrhage in the paramedian pons.   Pertinent Vitals Pain Assessment: 0-10 Pain Score: 4  Pain Location: neck Pain Intervention(s): Repositioned  SLP Plan  All goals met (dysphagia goals met; continue higher level cogn )    Recommendations Diet recommendations: Regular;Thin liquid Liquids provided via: Straw;Cup Medication Administration: Whole meds with puree Supervision: Patient able to self feed Compensations: Clear throat intermittently Postural Changes and/or Swallow Maneuvers: Seated upright 90 degrees              Oral Care Recommendations: Oral care BID Follow up Recommendations: Inpatient Rehab Plan: All goals met (dysphagia goals met; continue higher level cogn )   [name] [name] L. Tivis Ringer, [place] CCC/SLP Pager [phone]      [name] [name] 10/05/2014, 11:36 AM

## 2014-10-05 NOTE — Progress Notes (Signed)
Rehab admissions - I have clearance for Stroke service to admit to acute inpatient rehab today.  Bed available and will admit to inpatient rehab today.  Call me for questions.  #[phone]

## 2014-10-06 ENCOUNTER — Inpatient Hospital Stay (HOSPITAL_COMMUNITY): Payer: Managed Care, Other (non HMO) | Admitting: Occupational Therapy

## 2014-10-06 ENCOUNTER — Inpatient Hospital Stay (HOSPITAL_COMMUNITY): Payer: Managed Care, Other (non HMO) | Admitting: Speech Pathology

## 2014-10-06 ENCOUNTER — Inpatient Hospital Stay (HOSPITAL_COMMUNITY): Payer: Managed Care, Other (non HMO) | Admitting: Physical Therapy

## 2014-10-06 DIAGNOSIS — M436 Torticollis: Secondary | ICD-10-CM | POA: Diagnosis present

## 2014-10-06 LAB — COMPREHENSIVE METABOLIC PANEL
ALBUMIN: 1.8 g/dL — AB (ref 3.5–5.2)
ALT: 7 U/L (ref 0–35)
ANION GAP: 16 — AB (ref 5–15)
AST: 11 U/L (ref 0–37)
Alkaline Phosphatase: 85 U/L (ref 39–117)
BILIRUBIN TOTAL: 0.3 mg/dL (ref 0.3–1.2)
BUN: 49 mg/dL — AB (ref 6–23)
CHLORIDE: 108 meq/L (ref 96–112)
CO2: 15 mEq/L — ABNORMAL LOW (ref 19–32)
Calcium: 9.1 mg/dL (ref 8.4–10.5)
Creatinine, Ser: 3.04 mg/dL — ABNORMAL HIGH (ref 0.50–1.10)
GFR calc Af Amer: 19 mL/min — ABNORMAL LOW (ref >90)
GFR calc non Af Amer: 17 mL/min — ABNORMAL LOW (ref >90)
Glucose, Bld: 88 mg/dL (ref 70–99)
Potassium: 4.3 mEq/L (ref 3.7–5.3)
Sodium: 139 mEq/L (ref 137–147)
Total Protein: 6.8 g/dL (ref 6.0–8.3)

## 2014-10-06 LAB — CBC WITH DIFFERENTIAL/PLATELET
BASOS PCT: 0 % (ref 0–1)
Basophils Absolute: 0 10*3/uL (ref 0.0–0.1)
Eosinophils Absolute: 0.1 10*3/uL (ref 0.0–0.7)
Eosinophils Relative: 1 % (ref 0–5)
HCT: 25 % — ABNORMAL LOW (ref 36.0–46.0)
HEMOGLOBIN: 8.2 g/dL — AB (ref 12.0–15.0)
LYMPHS PCT: 13 % (ref 12–46)
Lymphs Abs: 1 10*3/uL (ref 0.7–4.0)
MCH: 27.3 pg (ref 26.0–34.0)
MCHC: 32.8 g/dL (ref 30.0–36.0)
MCV: 83.3 fL (ref 78.0–100.0)
MONOS PCT: 5 % (ref 3–12)
Monocytes Absolute: 0.4 10*3/uL (ref 0.1–1.0)
NEUTROS ABS: 6.6 10*3/uL (ref 1.7–7.7)
NEUTROS PCT: 81 % — AB (ref 43–77)
Platelets: 216 10*3/uL (ref 150–400)
RBC: 3 MIL/uL — ABNORMAL LOW (ref 3.87–5.11)
RDW: 14.1 % (ref 11.5–15.5)
WBC: 8.1 10*3/uL (ref 4.0–10.5)

## 2014-10-06 LAB — URINE CULTURE: Colony Count: 30000

## 2014-10-06 LAB — OCCULT BLOOD X 1 CARD TO LAB, STOOL: Fecal Occult Bld: NEGATIVE

## 2014-10-06 MED ORDER — DICLOFENAC SODIUM 1 % TD GEL
2.0000 g | Freq: Four times a day (QID) | TRANSDERMAL | Status: DC
  Administered 2014-10-06 – 2014-10-10 (×15): 2 g via TOPICAL
  Filled 2014-10-06: qty 100

## 2014-10-06 MED ORDER — TROLAMINE SALICYLATE 10 % EX CREA
1.0000 "application " | TOPICAL_CREAM | Freq: Three times a day (TID) | CUTANEOUS | Status: DC
  Filled 2014-10-06: qty 85

## 2014-10-06 MED ORDER — TIZANIDINE HCL 2 MG PO TABS
2.0000 mg | ORAL_TABLET | Freq: Every day | ORAL | Status: DC
  Administered 2014-10-06 – 2014-10-09 (×4): 2 mg via ORAL
  Filled 2014-10-06 (×5): qty 1

## 2014-10-06 MED ORDER — MUSCLE RUB 10-15 % EX CREA
1.0000 "application " | TOPICAL_CREAM | Freq: Three times a day (TID) | CUTANEOUS | Status: DC
  Administered 2014-10-06 – 2014-10-10 (×11): 1 via TOPICAL
  Filled 2014-10-06: qty 85

## 2014-10-06 NOTE — Progress Notes (Signed)
Social Work Assessment and Plan Social Work Assessment and Plan  Patient Details  Name: [name] [name] MRN: [id number] Date of Birth: 1961-08-24  Today's Date: 10/06/2014  Problem List:  Patient Active Problem List   Diagnosis Date Noted  . Torticollis, acquired 10/06/2014  . Essential hypertension 10/05/2014  . Acute respiratory acidosis   . ICH (intracerebral hemorrhage)   . Chronic ankle pain   . Cerebral hemorrhage   . Malignant hypertension   . Stroke 09/28/2014  . 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 Hypertensive emergency 09/28/2014  . ANEMIA, IRON DEFICIENCY 02/18/2008  . 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 ADRENAL INSUFFICIENCY, HX OF 02/18/2008  . DIABETES MELLITUS, TYPE II 10/25/2007  . HYPERLIPIDEMIA 10/25/2007  . UNSPECIFIED ULCERATION OF VULVA 10/25/2007  . Systemic lupus erythematosus 10/25/2007  . RHEUMATOID ARTHRITIS 10/25/2007  . HYPERTENSION NEC 10/25/2007  . COLONIC POLYPS, HYPERPLASTIC 04/08/2006  . HEMORRHOIDS 04/08/2006   Past Medical History:  Past Medical History  Diagnosis Date  . Hypertension   . Lupus   . CKD (chronic kidney disease)     due to lupus/Dr. [name]  . Diabetes mellitus   . Stenosis of cervical spine region     with HNP at C5/6, C6/7  . Metatarsal bone fracture right    4th  . 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   Past Surgical History:  Past Surgical History  Procedure Laterality Date  . Breast biopsy     Social History:  reports that she has been smoking Cigarettes.  She has a 15 pack-year smoking history. She does not have any smokeless tobacco history on file. She reports that she does not drink alcohol or use illicit drugs.  Family / Support Systems Marital Status: Single Patient Roles: Partner, Parent, Other (Comment) (Employee) Spouse/Significant Other: Richard Wharton-boyfriend/fatehr of Barbaraann Rondo Children: Rodney-son  [phone]  [phone]  Brittany-daughter  [phone] Other  Supports: Joan-sister  [phone] Anticipated Caregiver: Children and boyfriend Ability/Limitations of Caregiver: Between all of them they can provide 24 hr care-work different shifts Caregiver Availability: 24/7 Family Dynamics: Close knit family pt and three children all live together and boyfriend comes over every day to see them.  They have a very strong relationship and will do anything for one another.    Social History Preferred language: English Religion: Non-Denominational Cultural Background: No issues Education: High School Read: Yes Write: Yes Employment Status: Employed Name of Employer: [employer] Return to Work Plans: Plans to return if can Freight forwarder Issues: No issues Guardian/Conservator: None-according to MD pt is capable of making her own decisions while here.   Abuse/Neglect Physical Abuse: Denies Verbal Abuse: Denies Sexual Abuse: Denies Exploitation of patient/patient's resources: Denies Self-Neglect: Denies  Emotional Status Pt's affect, behavior adn adjustment status: Pt is motivated but is very sleepy and can't stay awake.  She wants to do therapies but has trouble keeping her eyes open.  She is aware it is due to her stroke/bleed.  She has always been independent and wants to regain this while here. Recent Psychosocial Issues: Other health issues which she was non-complaint with.  She is realizing she needs to take her meds and go to the doctor's regularly. Pyschiatric History: No history deferred depression screen due to not able to stay awake, but do feel with her age she would benefit form Neuro-psych.  Will see on Monday Substance Abuse History: Tobacco-smoked a pack a day-plans to quit.  Awar eof the resources available to her  Patient /  Family Perceptions, Expectations & Goals Pt/Family understanding of illness & functional limitations: Pt and son have a good understanding of her stroke and deficits.  They both are hopeful she will do  well here and not need much care at discharge.  Family would like to talk with the MD and find out more information abouther bleed and how to prevent another one.  RN has given them information to read.  Will have PA speak to . Premorbid pt/family roles/activities: Mother, Employee, grandmother, Sister, church member,. etc Anticipated changes in roles/activities/participation: resume Pt/family expectations/goals: Pt states: " I want to do for myself, not used to havng others do for me."  Son states: " We will do whatever she needs done, but hope she does well here."  [employer]: None Premorbid Home Care/DME Agencies: None Transportation available at discharge: [employer] referrals recommended: Neuropsychology, Support group (specify)  Discharge Planning Living Arrangements: Children Support Systems: Spouse/significant other, Children, Other relatives, Water engineer, Social worker community Type of Residence: Private residence Insurance Resources: Multimedia programmer (specify) Psychologist, counselling) Financial Resources: Employment, Secondary school teacher Screen Referred: Yes Living Expenses: Rent Money Management: Patient Does the patient have any problems obtaining your medications?: No Home Management: Everyone-pt and children Patient/Family Preliminary Plans: Return home with children and boyfriend assisting with her care.  Between all of them they feel they can provide 24 hr care, boyfriend doesn't work and can be there when children are working. Pt and three grown children live together.  Aware she will need 24 hr care/supervision at discharge. Social Work Anticipated Follow Up Needs: HH/OP, Support Group  Clinical Impression Pleasant sleepy female who is falling asleep in our interview.  She reports she can't keep her eyes open since her stroke.  Very committed and supportive family who will provide 24 hr care at discharge. Pt is young and will benefit from  Neuro-psych while here and to discuss the importance of going to the doctors' regularly and taking her medicines.  Will work with all of a safe discharge plan.  Elease Hashimoto 10/06/2014, 3:42 PM

## 2014-10-06 NOTE — Evaluation (Signed)
Occupational Therapy Assessment and Plan  Patient Details  Name: [name] [name] MRN: [id number] Date of Birth: 12/15/1960  OT Diagnosis: abnormal posture, disturbance of vision and hemiplegia affecting non-dominant side Rehab Potential:  good for stated goals ELOS: 14-18days   Today's Date: 10/06/2014 OT Individual Time: 9937-1696 OT Individual Time Calculation (min): 60 min     Problem List:  Patient Active Problem List   Diagnosis Date Noted  . Torticollis, acquired 10/06/2014  . Essential hypertension 10/05/2014  . Acute respiratory acidosis   . ICH (intracerebral hemorrhage)   . Chronic ankle pain   . Cerebral hemorrhage   . Malignant hypertension   . Stroke 09/28/2014  . 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 Hypertensive emergency 09/28/2014  . ANEMIA, IRON DEFICIENCY 02/18/2008  . 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 ADRENAL INSUFFICIENCY, HX OF 02/18/2008  . DIABETES MELLITUS, TYPE II 10/25/2007  . HYPERLIPIDEMIA 10/25/2007  . UNSPECIFIED ULCERATION OF VULVA 10/25/2007  . Systemic lupus erythematosus 10/25/2007  . RHEUMATOID ARTHRITIS 10/25/2007  . HYPERTENSION NEC 10/25/2007  . COLONIC POLYPS, HYPERPLASTIC 04/08/2006  . HEMORRHOIDS 04/08/2006    Past Medical History:  Past Medical History  Diagnosis Date  . Hypertension   . Lupus   . CKD (chronic kidney disease)     due to lupus/Dr. [name]  . Diabetes mellitus   . Stenosis of cervical spine region     with HNP at C5/6, C6/7  . Metatarsal bone fracture right    4th  . 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   Past Surgical History:  Past Surgical History  Procedure Laterality Date  . Breast biopsy      Assessment & Plan Clinical Impression: Patient is a 53 y.o. year old female with history of HTN, hyperlipidemia, lupus with mild LLE weakness as well as compliance issues who was admitted on 09/28/14 with acute onset left sided weakness and numbness as well as gaze to the right side.  Patient reported being out of BP meds for 3 weeks and BP 238/88 at admission. UDS negative. 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 Carotid dopplers significant for R-40-59% ICA stenosis. ST evaluation with high level cognitive deficits affecting STM as well as solving/reasoning especially in math area. Blood pressure remains labile requiring Cardene drip, neck, back and ankle pain as well as issues with fluctuating bouts of lethargy. Follow up MRI/MRA brain done today revealing changes c/w acute/subacute hemorrhage in right parmedian pons and no underlying mass evident. Minimal diffusion abnormality in high frontal lobes bilaterally likely due to calcification and probably meningioma.  She has worsening of respiratory status on 12/01 with difficulty handling secretions and complaints of severe throat pain. She was made NPO and CXR was ordered revealing fluid overload. CCM was consulted for input and patient was treated with IV diuresis and has had improvement in symptoms. Lisinopril was dicontinued due to of renal status and she was treated with IV bicarb for acidosis. Repeat cardiac echo showed EF 60-70% with AV sclerosis without stenosis. FEES done yesterday showing mild dysphagia with pentration due to large boluses but no aspiration therefore she was started on regular textures with thin liquids. Patient with resultant left sided weakness impacting mobility as well as safety. Therapy ongoing and CIR recommended by rehab team and MD.  Patient transferred to CIR on 10/05/2014 .    Patient currently requires  Mod- Max A with basic self-care skills secondary to muscle weakness, decreased cardiorespiratoy endurance, L inattention, decreased awareness and decreased safety awareness and decreased standing balance, decreased postural control, hemiplegia and decreased balance  strategies.  Prior to hospitalization, patient could complete ADLs and IADLs with independent .  Patient will benefit from skilled intervention to increase independence with basic self-care skills prior to discharge home with care partner.  Anticipate patient will require 24 hour supervision and follow up home health.      Skilled Therapeutic Intervention Upon entering the room, pt supine in bed with no c/o pain this session. OT educated pt on OT purpose, POC, and goals in which pt verbalized understanding and agreement. B & D session performed seated on EOB with close supervision - steady assist while seated. Pt standing with Mod A and Max A for dynamic standing balance for clothing management. Pt transferred to wheelchair with Mod A stand pivot. Pt appears to be anxious with movement and repeats, "Don't let me fall." Grooming performed seated in wheelchair at sink side with set up A. Prior to exiting room, food tray set up as well with opening of containers and pt seated in wheelchair with tray table in front of her and call bell within reach while eating.   OT Evaluation Precautions/Restrictions  Precautions Precautions: Fall Restrictions Weight Bearing Restrictions: No General Chart Reviewed: Yes Vital Signs Therapy Vitals Temp: 98.7 F (37.1 C) Pulse Rate: 85 Resp: 16 BP: (!) 159/70 mmHg Oxygen Therapy SpO2: 96 % O2 Device: Not Delivered Pain Pain Assessment Pain Assessment: 0-10 Pain Score: 3  Pain Location: Neck Pain Orientation: Left Pain Descriptors / Indicators: Aching;Sore Pain Onset: On-going Patients Stated Pain Goal: 0 Pain Intervention(s): RN made aware;Repositioned;Other (Comment) (heating pad on pt neck and L UE upon entering the room) Multiple Pain Sites: No Home Living/Prior Functioning Home Living Available Help at Discharge: Family, Available 24 hours/day Type of Home: House Home Access: Stairs to enter [employer] of Steps: 3-4 Entrance  Stairs-Rails: Right Home Layout: Two level, Able to live on main level with bedroom/bathroom IADL History Homemaking Responsibilities: Yes Homemaking Comments: Pt reports she has 3 children ages 85-29 living in the home who work different shifts. "Everyone takes turns with the chores and cooking." Occupation: Full time employment Prior Function Level of Independence: Independent with basic ADLs, Independent with homemaking with ambulation, Independent with gait  Able to Take Stairs?: Yes Driving: Yes Vocation: Full time employment Leisure: Hobbies-yes (Comment) 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 Hobbies include spending time with family and going out to eat Vision/Perception  Vision- History Baseline Vision/History: Wears glasses Wears Glasses: At all times Patient Visual Report: No change from baseline Vision- Assessment Vision Assessment?: Vision impaired- to be further tested in functional context Additional Comments: L inattention  Cognition Overall Cognitive Status: Impaired/Different from baseline Arousal/Alertness: Lethargic Orientation Level: Oriented to person;Oriented to place;Disoriented to time;Oriented to situation Attention: Sustained Sustained Attention: Impaired Sustained Attention Impairment: Verbal basic;Functional basic Memory: Impaired Memory Impairment: Storage deficit;Retrieval deficit;Decreased recall of new information Decreased Short Term Memory: Verbal basic;Functional basic Awareness: Impaired Awareness Impairment: Emergent impairment Problem Solving: Impaired Problem Solving Impairment: Functional basic Executive Function: Reasoning;Self Monitoring;Self Correcting Reasoning: Impaired Reasoning Impairment: Functional complex Self Monitoring: Impaired Self Monitoring Impairment: Functional basic;Functional complex Self Correcting: Impaired Self Correcting Impairment: Functional basic;Functional complex Safety/Judgment: Impaired Comments: L  inattention Sensation Sensation Light Touch: Impaired Detail Light Touch Impaired Details: Impaired LUE;Impaired LLE Stereognosis: Not tested Hot/Cold: Impaired Detail Hot/Cold Impaired Details: Impaired  LUE;Impaired LLE Proprioception: Impaired by gross assessment Coordination Gross Motor Movements are Fluid and Coordinated: No Fine Motor Movements are Fluid and Coordinated: No Coordination and Movement Description: Pt with decreased [place] and gross coordination in L UE with functional tasks especially for speed and dexterity Motor  Motor Motor: Hemiplegia Mobility  Transfers Sit to Stand: 4: Min assist;3: Mod assist;With armrests;With upper extremity assist;From chair/3-in-1 Sit to Stand Details: Manual facilitation for weight shifting;Manual facilitation for placement;Verbal cues for technique;Verbal cues for sequencing;Tactile cues for weight shifting Sit to Stand Details (indicate cue type and reason): pt requires manual facilitation for anterior weight shift and assist for L foot placement as she tends to keep it extended in front when attempting to stand Stand to Sit: 4: Min assist;With upper extremity assist;With armrests;To chair/3-in-1 Stand to Sit Details (indicate cue type and reason): Verbal cues for technique;Verbal cues for precautions/safety  Trunk/Postural Assessment  Cervical Assessment Cervical Assessment: Exceptions to [facility] (R gaze and head turn) Thoracic Assessment Thoracic Assessment: Within Functional Limits Lumbar Assessment Lumbar Assessment: Within Functional Limits Postural Control Postural Control: Deficits on evaluation Protective Responses: delayed/impaired Postural Limitations: difficulty with neck AROM due to R rotation and L lateral flexion as well as pain  Balance Balance Balance Assessed: Yes Dynamic Sitting Balance Dynamic Sitting - Balance Support: Feet supported;During functional activity Dynamic Sitting - Level of Assistance: 5: Stand by  assistance Static Standing Balance Static Standing - Balance Support: During functional activity;Bilateral upper extremity supported Static Standing - Level of Assistance: 4: Min assist Dynamic Standing Balance Dynamic Standing - Balance Support: Bilateral upper extremity supported;During functional activity Dynamic Standing - Level of Assistance: 3: Mod assist Dynamic Standing - Comments: LB bathing and dressing Extremity/Trunk Assessment RUE Assessment RUE Assessment: Within Functional Limits LUE Assessment LUE Assessment: Exceptions to WFL LUE AROM (degrees) Overall AROM Left Upper Extremity: Deficits;Due to pain;Other (comment) (AROM ~ 120 degress but limited secondary to pain ) LUE PROM (degrees) LUE Overall PROM Comments: PROM slightly limited secondary to pain LUE Strength LUE Overall Strength: Within Functional Limits for tasks assessed (3+/5 throughout. Pt able to wring wash cloth and assist with bathing and dressing with L UE)  FIM:  FIM - Eating Eating Activity: 5: Set-up assist for open containers FIM - Grooming Grooming Steps: Wash, rinse, dry face;Wash, rinse, dry hands;Oral care, brush teeth, clean dentures Grooming: 5: Set-up assist to open containers FIM - Bathing Bathing Steps Patient Completed: Chest;Right Arm;Left Arm;Abdomen;Front perineal area;Right upper leg;Left upper leg Bathing: 3: Mod-Patient completes 5-7 [id number] parts or 50-74% FIM - Upper Body Dressing/Undressing Upper body dressing/undressing steps patient completed: Thread/unthread right bra strap;Thread/unthread left bra strap;Thread/unthread right sleeve of pullover shirt/dresss;Thread/unthread left sleeve of pullover shirt/dress Upper body dressing/undressing: 3: Mod-Patient completed 50-74% of tasks FIM - Lower Body Dressing/Undressing Lower body dressing/undressing steps patient completed: Thread/unthread right underwear leg;Thread/unthread right pants leg Lower body dressing/undressing: 2:  Max-Patient completed 25-49% of tasks FIM - BEngineer, siteAssistive Devices: Arm rests Bed/Chair Transfer: 3: Supine > Sit: Mod A (lifting assist/Pt. 50-74%/lift 2 legs;3: Bed > Chair or W/C: Mod A (lift or lower assist) FIM - Tub/Shower Transfers Tub/shower Transfers: 0-Activity did not occur or was simulated   Refer to Care Plan for Long Term Goals  Recommendations for other services: Neuropsych  Discharge Criteria: Patient will be discharged from OT if patient refuses treatment 3 consecutive times without medical reason, if treatment goals not met, if there is a change in medical status, if patient makes no  progress towards goals or if patient is discharged from hospital.  The above assessment, treatment plan, treatment alternatives and goals were discussed and mutually agreed upon: by patient  Phineas Semen 10/06/2014, 8:21 AM

## 2014-10-06 NOTE — Plan of Care (Signed)
Problem: RH SAFETY Goal: RH STG ADHERE TO SAFETY PRECAUTIONS W/ASSISTANCE/DEVICE STG Adhere to Safety Precautions With min Assistance/Device or cues.  Outcome: Progressing  Problem: RH PAIN MANAGEMENT Goal: RH STG PAIN MANAGED AT OR BELOW PT'S PAIN GOAL Pt will maintain a pain score of 5 or less with PRN pain medications. Outcome: Progressing

## 2014-10-06 NOTE — Progress Notes (Signed)
Patient information reviewed and entered into eRehab system by Krikor Willet, RN, CRRN, PPS Coordinator.  Information including medical coding and functional independence measure will be reviewed and updated through discharge.    

## 2014-10-06 NOTE — Evaluation (Signed)
Speech Language Pathology Assessment and Plan  Patient Details  Name: [name] [name] MRN: [id number] Date of Birth: 02/28/1961  SLP Diagnosis: Cognitive Impairments;Dysphagia  Rehab Potential: Good ELOS: 14-18 days    Today's Date: 10/06/2014 SLP Individual Time: 6967-8938 SLP Individual Time Calculation (min): 60 min   Problem List:  Patient Active Problem List   Diagnosis Date Noted  . Torticollis, acquired 10/06/2014  . Essential hypertension 10/05/2014  . Acute respiratory acidosis   . ICH (intracerebral hemorrhage)   . Chronic ankle pain   . Cerebral hemorrhage   . Malignant hypertension   . Stroke 09/28/2014  . 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 Hypertensive emergency 09/28/2014  . ANEMIA, IRON DEFICIENCY 02/18/2008  . 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 ADRENAL INSUFFICIENCY, HX OF 02/18/2008  . DIABETES MELLITUS, TYPE II 10/25/2007  . HYPERLIPIDEMIA 10/25/2007  . UNSPECIFIED ULCERATION OF VULVA 10/25/2007  . Systemic lupus erythematosus 10/25/2007  . RHEUMATOID ARTHRITIS 10/25/2007  . HYPERTENSION NEC 10/25/2007  . COLONIC POLYPS, HYPERPLASTIC 04/08/2006  . HEMORRHOIDS 04/08/2006   Past Medical History:  Past Medical History  Diagnosis Date  . Hypertension   . Lupus   . CKD (chronic kidney disease)     due to lupus/Dr. [name]  . Diabetes mellitus   . Stenosis of cervical spine region     with HNP at C5/6, C6/7  . Metatarsal bone fracture right    4th  . 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   Past Surgical History:  Past Surgical History  Procedure Laterality Date  . Breast biopsy      Assessment / Plan / Recommendation Clinical Impression [name] is a 53 year old female with history of HTN, hyperlipidemia, lupus with mild LLE weakness who was admitted on 09/28/14 with acute onset left sided weakness and numbness as well as gaze to the right side. Patient reported being out of BP meds for 3 weeks and BP 238/88 at  admission. UDS negative.  CT of head revealed acute hemorrhagic infarct in pons and chronic subcortical white matter disease in right frontoparietal area. SLP evaluation identified high level cognitive deficits affecting short term memory as well as solving/reasoning especially in math area. Follow up MRI/MRA brain done revealing changes in acute/subacute hemorrhage in right parmedian pons and no underlying mass evident. Minimal diffusion abnormality in high frontal lobes bilaterally likely due to calcification and probably meningioma. She had worsening of respiratory status on 12/01 with difficulty handling secretions and complaints of severe throat pain. She was made NPO and CXR was ordered revealing fluid overload. CCM was consulted for input and patient was treated with IV diuresis and has had improvement in symptoms.  FEES done yesterday showing mild dysphagia with pentration due to large boluses but no aspiration therefore she was started on regular textures with thin liquids.  Patient with resultant left sided weakness impacting mobility as well as safety.  Therapy ongoing and CIR recommended by rehab team and MD; patient admitted 10/05/14.     Orders received; bedside swallow and cognitive-linguistic evaluations completed.  Patient presents with intact speech and language abilities.  Oral motor exam remarkable for mild left sided lingual and labial weakness and FEES confirmed mild pharyngeal weakness resulting in mild dysphagia which is compound by current cognitive deficits. Patient demonstrates moderately severe cognitive deficits resulting from impaired sustained attention, which impacts all higher level abilities.  As a result, patient requires skilled SLP services to address deficits to maximize functional  independence and reduce overall burden of care prior to discharge home with 24/7 supervision.       Skilled Therapeutic Interventions          Bedside swallow and cognitive-linguistic evaluation  completed with results and recommendations reviewed with patient.  Patient also required Max multimodal cues to sustain attention to tasks throughout session for ~1 minute increments.     SLP Assessment  Patient will need skilled Speech Lanaguage Pathology Services during CIR admission    Recommendations  Diet Recommendations: Regular;Thin liquid Liquid Administration via: Cup;Straw Medication Administration: Whole meds with puree (or crushed for large meds) Supervision: Patient able to self feed;Intermittent supervision to cue for compensatory strategies Compensations: Slow rate;Small sips/bites;Clear throat intermittently Postural Changes and/or Swallow Maneuvers: Out of bed for meals;Upright 30-60 min after meal Oral Care Recommendations: Oral care BID Recommendations for Other Services: Neuropsych consult Patient destination: Home Follow up Recommendations: 24 hour supervision/assistance;Outpatient SLP Equipment Recommended: None recommended by SLP    SLP Frequency 5 out of 7 days   SLP Treatment/Interventions Cognitive remediation/compensation;Cueing hierarchy;Dysphagia/aspiration precaution training;Environmental controls;Functional tasks;Internal/external aids;Patient/family education;Therapeutic Activities    Pain Pain Assessment Pain Assessment: No/denies pain  Prior Functioning Cognitive/Linguistic Baseline: Within functional limits Type of Home: House  Lives With: Son;Daughter Available Help at Discharge: Family;Available 24 hours/day Education: 2 years of college Vocation: Full time employment  Short Term Goals: Week 1: SLP Short Term Goal 1 (Week 1): Patient will consume regular textures and thin liquids with Mod I for use of safe swallow compensatory strategies SLP Short Term Goal 2 (Week 1): Patient will sustain attention to familiar tasks for 2-3 minutes with Mod vebral cues for redirection  SLP Short Term Goal 3 (Week 1): Patient will solve moderately complex  problems with Mod verbal cues to self-monitor errors  SLP Short Term Goal 4 (Week 1): Patient will utilize external aids to assist with recall of daily information with Mod question cues SLP Short Term Goal 5 (Week 1): Patient will request help as needed with Mod question cues  See FIM for current functional status Refer to Care Plan for Long Term Goals  Recommendations for other services: Neuropsych  Discharge Criteria: Patient will be discharged from SLP if patient refuses treatment 3 consecutive times without medical reason, if treatment goals not met, if there is a change in medical status, if patient makes no progress towards goals or if patient is discharged from hospital.  The above assessment, treatment plan, treatment alternatives and goals were discussed and mutually agreed upon: by patient  Gunnar Fusi, M.A., [name] [phone]  [place] 10/06/2014, 12:35 PM

## 2014-10-06 NOTE — Progress Notes (Signed)
[facility] PHYSICAL [facility]ICINE & REHABILITATION     PROGRESS NOTE    Subjective/Complaints:   Objective: Vital Signs: Blood pressure 175/75, pulse 97, temperature 98 F (36.7 C), temperature source Oral, resp. rate 20, height 5' 5.5" (1.664 m), weight 66.5 kg (146 lb 9.7 oz), last menstrual period 07/18/2012, SpO2 98 %. Dg Chest Port 1 View  10/05/2014   CLINICAL DATA:  Acute respiratory acidosis.  EXAM: PORTABLE CHEST - 1 VIEW  COMPARISON:  10/04/2014  FINDINGS: No residual pulmonary edema. Probable small effusions with mild basilar atelectasis. Cardiac silhouette is mildly enlarged. Normal mediastinal and hilar contours. No pneumothorax.  IMPRESSION: Resolved pulmonary edema. Probable small residual pleural effusions with mild basilar atelectasis.   Electronically Signed   By: Lajean Manes M.D.   On: 10/05/2014 08:00    Recent Labs  10/05/14 0521 10/06/14 0611  WBC 8.8 8.1  HGB 7.7* 8.2*  HCT 23.8* 25.0*  PLT 213 216    Recent Labs  10/05/14 0521 10/06/14 0611  NA 139 139  K 4.0 4.3  CL 108 108  GLUCOSE 114* 88  BUN 50* 49*  CREATININE 2.86* 3.04*  CALCIUM 8.9 9.1   CBG (last 3)   Recent Labs  10/05/14 1059  GLUCAP 146*    Wt Readings from Last 3 Encounters:  10/05/14 66.5 kg (146 lb 9.7 oz)  09/28/14 64.6 kg (142 lb 6.7 oz)  09/04/12 61.236 kg (135 lb)    Physical Exam:  Constitutional: She is oriented to person, place, and time. She appears well-developed and well-nourished.   HENT: dentition fair Head: Normocephalic and atraumatic.  Blistered areas on tip of tongue--non tender  Eyes: Conjunctivae are normal. Pupils are equal, round, and reactive to light.  Neck: Muscular tenderness present. Decreased range of motion present. right torticollis Cardiovascular: Regular rhythm. Tachycardia present.  Respiratory: Effort normal. She has decreased breath sounds in the right lower field and the left lower field.  GI: Soft. Bowel sounds are normal.   Musculoskeletal: She exhibits no edema or tenderness. Head still rotated rightward Neurological: She is oriented to person, place, and time.  Left inattention. Able to state day, month as well as medical questions. Able to follow basic commands without difficulty. Inconsistent RUE weakness --poor effort. LLE weakness with dysesthesias. Sensation 1/2 LUE and LLE with PP and LT. Left PD. intermittent tone LUE. Has poor awareness and insight into deficits.  Skin: Skin is warm and dry.  Psychiatric: Her speech is normal. Her affect is blunt. She is inattentive.   Assessment/Plan: 1. Functional deficits secondary to right paramedian pontine infarct which require 3+ hours per day of interdisciplinary therapy in a comprehensive inpatient rehab setting. Physiatrist is providing close team supervision and 24 hour management of active medical problems listed below. Physiatrist and rehab team continue to assess barriers to discharge/monitor patient progress toward functional and medical goals. FIM:                   Comprehension Comprehension Mode: Auditory Comprehension: 5-Follows basic conversation/direction: With no assist  Expression Expression Mode: Verbal Expression: 5-Expresses basic needs/ideas: With no assist  Social Interaction Social Interaction: 6-Interacts appropriately with others with medication or extra time (anti-anxiety, antidepressant).  Problem Solving Problem Solving: 5-Solves complex 90% of the time/cues < 10% of the time  Memory Memory: 6-More than reasonable amt of time  Medical Problem List and Plan: 1. 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 DVT Prophylaxis/Anticoagulation: Mechanical:  Sequential compression devices, below knee Bilateral lower extremities 3. Chronic pain/Pain Management: Alternate heat with ice to neck in addition to oxycodone prn. Therapy to work on ROM for neck.  Keep pillow on head/neck to support and stretch  -add low dose zanaflex as well. 4. Mood: More alert today. LCSW to follow for evaluation and support.  5. Neuropsych: This patient is capable of making decisions on his own behalf. 6. Skin/Wound Care: Routine pressure relief measures.  7. Fluids/Electrolytes/Nutrition: Monitor I/O. Will add supplements to help with hydration and intake. Check lyes in am.  8. HTN: Monitor qid with SBP goal below 180. Continue lasix, Normodyne and Norvasc.   -bp's improving 9. Lupus: Has not seen rheum (Dr. Mearl Latin) for the past year and question compliance with medications. Continue Cellcept bid.  10. Acute on chronic CKD: Baseline BUN/Cr- 38/3.20 at admission. Encourage po fluid intake.  11. ABLA: Has had drop in Hgb 11.6--> 9.5-->8.5-->7.7. Continue iron supplement.   -hgb back up to 8.2 today  -need a stool guaiac  12. Dyslipidemia: On Lipitor daily.  13. Medical non-compliance: Counsel/Educate patient on importance of adhering to medication regimen.  14. Leucocytosis: Likely reactive. Will check UA/UCS.  LOS (Days) 1 A FACE TO FACE EVALUATION WAS PERFORMED  SWARTZ,ZACHARY T 10/06/2014 7:51 AM

## 2014-10-06 NOTE — Evaluation (Signed)
Physical Therapy Assessment and Plan  Patient Details  Name: [name] [name] MRN: [id number] Date of Birth: 05-17-1961  PT Diagnosis: Abnormal posture, Abnormality of gait, Ataxia, Cognitive deficits, Coordination disorder, Hemiplegia non-dominant, Impaired cognition, Impaired sensation, Muscle weakness and Pain in ankles and neck Rehab Potential: Good ELOS: 14-18 days   Today's Date: 10/06/2014 PT Individual Time: 3149-7026 PT Individual Time Calculation (min): 66 min    Problem List:  Patient Active Problem List   Diagnosis Date Noted  . Torticollis, acquired 10/06/2014  . Essential hypertension 10/05/2014  . Acute respiratory acidosis   . ICH (intracerebral hemorrhage)   . Chronic ankle pain   . Cerebral hemorrhage   . Malignant hypertension   . Stroke 09/28/2014  . 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 Hypertensive emergency 09/28/2014  . ANEMIA, IRON DEFICIENCY 02/18/2008  . 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 ADRENAL INSUFFICIENCY, HX OF 02/18/2008  . DIABETES MELLITUS, TYPE II 10/25/2007  . HYPERLIPIDEMIA 10/25/2007  . UNSPECIFIED ULCERATION OF VULVA 10/25/2007  . Systemic lupus erythematosus 10/25/2007  . RHEUMATOID ARTHRITIS 10/25/2007  . HYPERTENSION NEC 10/25/2007  . COLONIC POLYPS, HYPERPLASTIC 04/08/2006  . HEMORRHOIDS 04/08/2006    Past Medical History:  Past Medical History  Diagnosis Date  . Hypertension   . Lupus   . CKD (chronic kidney disease)     due to lupus/Dr. [name]  . Diabetes mellitus   . Stenosis of cervical spine region     with HNP at C5/6, C6/7  . Metatarsal bone fracture right    4th  . 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   Past Surgical History:  Past Surgical History  Procedure Laterality Date  . Breast biopsy      Assessment & Plan Clinical Impression: [name] [name] is a 53 y.o. female with history of HTN, hyperlipidemia, lupus with mild LLE weakness as well as compliance issues who was admitted on  09/28/14 with acute onset left sided weakness and numbness as well as gaze to the right side. Patient reported being out of BP meds for 3 weeks and BP 238/88 at admission. UDS negative. 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 Carotid dopplers significant for R-40-59% ICA stenosis. ST evaluation with high level cognitive deficits affecting STM as well as solving/reasoning especially in math area. Blood pressure remains labile requiring Cardene drip, neck, back and ankle pain as well as issues with fluctuating bouts of lethargy. Follow up MRI/MRA brain done today revealing changes c/w acute/subacute hemorrhage in right parmedian pons and no underlying mass evident. Minimal diffusion abnormality in high frontal lobes bilaterally likely due to calcification and probably meningioma.  She has worsening of respiratory status on 12/01 with difficulty handling secretions and complaints of severe throat pain. She was made NPO and CXR was ordered revealing fluid overload. CCM was consulted for input and patient was treated with IV diuresis and has had improvement in symptoms. Lisinopril was dicontinued due to of renal status and she was treated with IV bicarb for acidosis. Repeat cardiac echo showed EF 60-70% with AV sclerosis without stenosis. FEES done yesterday showing mild dysphagia with pentration due to large boluses but no aspiration therefore she was started on regular textures with thin liquids. Patient with resultant left sided weakness impacting mobility as well as safety. Patient transferred to CIR on 10/05/2014.   Patient currently requires mod with mobility  secondary to muscle weakness, decreased cardiorespiratoy endurance, impaired timing and sequencing, abnormal tone, unbalanced muscle activation, ataxia and decreased coordination, decreased attention to left and decreased  attention, decreased awareness, decreased problem solving, decreased safety awareness and decreased memory.  Prior to hospitalization, patient was independent  with mobility and lived with Son, Daughter in a House home.  Home access is 4Stairs to enter.  Patient will benefit from skilled PT intervention to maximize safe functional mobility, minimize fall risk and decrease caregiver burden for planned discharge home with 24 hour supervision.  Anticipate patient will benefit from follow up HH at discharge.  PT - End of Session Activity Tolerance: Decreased this session;Tolerates 30+ min activity with multiple rests Endurance Deficit: Yes Endurance Deficit Description: pt reporting fatigue after ambulation and stair negotiation, requires seated rest breaks PT Assessment Rehab Potential (ACUTE/IP ONLY): Good Barriers to Discharge: Inaccessible home environment;Decreased caregiver support PT Patient demonstrates impairments in the following area(s): Balance;Behavior;Endurance;Motor;Nutrition;Pain;Perception;Safety;Sensory PT Transfers Functional Problem(s): Bed Mobility;Bed to Chair;Car;Furniture;Floor PT Locomotion Functional Problem(s): Ambulation;Wheelchair Mobility;Stairs PT Plan PT Intensity: Minimum of 1-2 x/day ,45 to 90 minutes PT Frequency: 5 out of 7 days PT Duration Estimated Length of Stay: 14-18 days PT Treatment/Interventions: Ambulation/gait training;Balance/vestibular training;Cognitive remediation/compensation;Discharge planning;Disease management/prevention;DME/adaptive equipment instruction;Functional mobility training;Neuromuscular re-education;Pain management;Patient/family education;Psychosocial support;Stair training;Therapeutic Activities;Therapeutic Exercise;UE/LE Strength taining/ROM;UE/LE Coordination activities;Wheelchair propulsion/positioning PT Transfers Anticipated Outcome(s): supervision PT Locomotion Anticipated Outcome(s): supervision PT Recommendation Follow Up  Recommendations: Home health PT Patient destination: Home Equipment Recommended: To be determined Equipment Details: anticipate patient will need RW  Skilled Therapeutic Intervention Skilled therapeutic intervention initiated after completion of evaluation. Discussed with patient falls risk, safety within room, and focus of therapy during stay. Discussed possible LOS, goals, and f/u therapy. Pt with ataxic movement in LLE and keeps LLE extended with hips hinged, posterior weight shift. Unable to attempt gait with HHA but able to ambulate 30 ft using RW with mod A and LLE in full extension during swing phase. Pt left sitting in w/c with quick release belt on and all needs within reach.   PT Evaluation Precautions/Restrictions Precautions Precautions: Fall Restrictions Weight Bearing Restrictions: No General Chart Reviewed: Yes Family/Caregiver Present: No Vital Signs Pain Pain Assessment Pain Assessment: 0-10 Pain Score: 8  Pain Type: Acute pain Pain Location: Neck Pain Orientation: Left Pain Descriptors / Indicators: Aching Pain Onset: On-going Patients Stated Pain Goal: 2 Pain Intervention(s): Heat applied Multiple Pain Sites: No Home Living/Prior Functioning Home Living Available Help at Discharge: Family;Available 24 hours/day Type of Home: House Home Access: Stairs to enter Entrance Stairs-Number of Steps: 4 Entrance Stairs-Rails: Right Home Layout: Two level;Able to live on main level with bedroom/bathroom  Lives With: Son;Daughter Prior Function Level of Independence: Independent with basic ADLs;Independent with homemaking with ambulation;Independent with gait;Independent with transfers  Able to Take Stairs?: Yes Driving: Yes Vocation: Full time employment Leisure: Hobbies-yes (Comment) 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 Hobbies include spending time with family and going out to eat, play candy crush Vision/Perception   No changes from baseline   Cognition Overall Cognitive Status: Impaired/Different from baseline Arousal/Alertness: Lethargic Orientation Level: Oriented to person;Oriented to place;Disoriented to time;Oriented to situation Attention: Sustained Sustained Attention: Impaired Sustained Attention Impairment: Verbal basic;Functional basic Memory: Impaired Memory Impairment: Storage deficit;Retrieval deficit;Decreased recall of new information Decreased Short Term Memory: Verbal basic;Functional basic Awareness: Impaired Awareness Impairment: Emergent impairment Problem Solving: Impaired Problem Solving Impairment: Functional basic Executive Function: Reasoning;Self Monitoring;Self Correcting Reasoning: Impaired Reasoning Impairment: Functional complex Self Monitoring: Impaired Self Monitoring Impairment: Functional   basic;Functional complex Self Correcting: Impaired Self Correcting Impairment: Functional basic;Functional complex Behaviors: Other (comment) (left inattention ) Safety/Judgment: Impaired Sensation Sensation Light Touch: Impaired Detail Light Touch Impaired Details: Impaired LUE;Impaired LLE Stereognosis: Not tested Hot/Cold: Impaired Detail Hot/Cold Impaired Details: Impaired LUE;Impaired LLE Proprioception: Impaired by gross assessment Additional Comments: decreased sensation LLE and unable to detect LT at L ankle Coordination Gross Motor Movements are Fluid and Coordinated: No Fine Motor Movements are Fluid and Coordinated: No Coordination and Movement Description: Pt with decreased FMC and gross coordination in L UE with functional tasks especially for speed and dexterity Motor  Motor Motor: Hemiplegia;Abnormal tone;Abnormal postural alignment and control Motor - Skilled Clinical Observations: Pt demo mild L UE/LE hemiplegia, question tone versus ataxia versus proprioceptive deficits in LLE with standing/ambulation, pt with head turn to R and L cervical lateral flexion   Mobility Transfers Transfers: Yes Sit to Stand: 4: Min assist;3: Mod assist;With armrests;With upper extremity assist;From chair/3-in-1 Sit to Stand Details: Manual facilitation for weight shifting;Manual facilitation for placement;Verbal cues for technique;Verbal cues for sequencing;Tactile cues for weight shifting Sit to Stand Details (indicate cue type and reason): pt requires manual facilitation for anterior weight shift and assist for L foot placement as she tends to keep it extended in front when attempting to stand Stand to Sit: 4: Min assist;With upper extremity assist;With armrests;To chair/3-in-1 Stand to Sit Details (indicate cue type and reason): Verbal cues for technique;Verbal cues for precautions/safety Squat Pivot Transfers: 3: Mod assist;With upper extremity assistance Squat Pivot Transfer Details: Verbal cues for technique;Verbal cues for sequencing;Manual facilitation for weight bearing;Verbal cues for precautions/safety;Visual cues/gestures for sequencing Squat Pivot Transfer Details (indicate cue type and reason): patient requires assist for LLE weightbearing with transfer, maintains L knee flexed and foot off floor due to tingling sensation in LE Locomotion  Ambulation Ambulation: Yes Ambulation/Gait Assistance: 4: Min assist;3: Mod assist Ambulation Distance (Feet): 30 Feet Assistive device: Rolling walker Gait Gait: Yes Gait Pattern: Impaired Gait Pattern: Step-to pattern;Decreased step length - right;Decreased stance time - left;Decreased hip/knee flexion - left;Decreased dorsiflexion - left;Decreased weight shift to left;Decreased trunk rotation;Trunk flexed;Ataxic Gait velocity: decreased Stairs / Additional Locomotion Stairs: Yes Stairs Assistance: 3: Mod assist Stairs Assistance Details: Verbal cues for technique;Verbal cues for sequencing;Manual facilitation for weight shifting Stairs Assistance Details (indicate cue type and reason): maintains weight shifted  posteriorly with hips flexed Stair Management Technique: Two rails;Step to pattern;Forwards Number of Stairs: 5 Height of Stairs: 6 Ramp: Not tested (comment) Curb: Not tested (comment) Wheelchair Mobility Wheelchair Mobility: Yes Wheelchair Assistance: 5: Supervision Wheelchair Assistance Details: Verbal cues for technique;Verbal cues for sequencing Wheelchair Propulsion: Both upper extremities Wheelchair Parts Management: Needs assistance Distance: 150  Trunk/Postural Assessment  Cervical Assessment Cervical Assessment: Exceptions to WFL (cervical rotation to R with L lateral flexion) Thoracic Assessment Thoracic Assessment: Within Functional Limits Lumbar Assessment Lumbar Assessment: Within Functional Limits Postural Control Postural Control: Deficits on evaluation Protective Responses: delayed/impaired Postural Limitations: difficulty with neck AROM due to R rotation and L lateral flexion as well as pain  Balance Balance Balance Assessed: Yes Dynamic Sitting Balance Dynamic Sitting - Balance Support: Feet supported;During functional activity Dynamic Sitting - Level of Assistance: 5: Stand by assistance Static Standing Balance Static Standing - Balance Support: During functional activity;Bilateral upper extremity supported Static Standing - Level of Assistance: 4: Min assist Dynamic Standing Balance Dynamic Standing - Balance Support: Bilateral upper extremity supported;During functional activity Dynamic Standing - Level of Assistance: 3: Mod assist Extremity Assessment  RUE Assessment   RUE Assessment: Within Functional Limits LUE Assessment LUE Assessment: Exceptions to WFL LUE AROM (degrees) Overall AROM Left Upper Extremity: Deficits;Due to pain;Other (comment) (AROM ~ 120 degress but limited secondary to pain ) LUE PROM (degrees) LUE Overall PROM Comments: PROM slightly limited secondary to pain LUE Strength LUE Overall Strength: Within Functional Limits for tasks  assessed (3+/5 throughout. Pt able to wring wash cloth and assist with bathing and dressing with L UE) RLE Assessment RLE Assessment: Exceptions to [facility] RLE Strength RLE Overall Strength: Deficits RLE Overall Strength Comments: grossly 4+/5 throughout, pain on dorsal aspect of base of toes on palpation LLE Assessment LLE Assessment: Exceptions to [facility] LLE Strength LLE Overall Strength: Deficits LLE Overall Strength Comments: grossly 3+ to 4-/5 throughout, L ankle PROM appears fixed with no DF or PF noted, pain with palpation of ball of foot  FIM:  FIM - Bed/Chair Transfer Bed/Chair Transfer Assistive Devices: Arm rests Bed/Chair Transfer: 3: Chair or W/C > Bed: Mod A (lift or lower assist);3: Bed > Chair or W/C: Mod A (lift or lower assist) FIM - Locomotion: Wheelchair Distance: 150 Locomotion: Wheelchair: 5: Travels 150 ft or more: maneuvers on rugs and over door sills with supervision, cueing or coaxing FIM - Locomotion: Ambulation Locomotion: Ambulation Assistive Devices: Administrator Ambulation/Gait Assistance: 4: Min assist;3: Mod assist Locomotion: Ambulation: 1: Travels less than 50 ft with moderate assistance (Pt: 50 - 74%) FIM - Locomotion: Stairs Locomotion: Scientist, physiological: Hand rail - 2 Locomotion: Stairs: 2: Up and Down 4 - 11 stairs with moderate assistance (Pt: 50 - 74%)   Refer to Care Plan for Long Term Goals  Recommendations for other services: None  Discharge Criteria: Patient will be discharged from PT if patient refuses treatment 3 consecutive times without medical reason, if treatment goals not met, if there is a change in medical status, if patient makes no progress towards goals or if patient is discharged from hospital.  The above assessment, treatment plan, treatment alternatives and goals were discussed and mutually agreed upon: by patient  Carney Living A 10/06/2014, 11:01 AM

## 2014-10-06 NOTE — IPOC Note (Addendum)
Overall Plan of Care [facility]) Patient Details Name: [name] [name] MRN: [id number] DOB: 07/21/1961  Admitting Diagnosis: r cva  Hospital Problems: Principal Problem:   Pontine hemorrhage Active Problems:   Systemic lupus erythematosus   Malignant hypertension   Torticollis, acquired     Functional Problem List: Nursing Bladder, Sensory, Endurance, Medication Management, Motor, Pain  PT Balance, Behavior, Endurance, Motor, Nutrition, Pain, Perception, Safety, Sensory  OT Balance, Safety, Sensory, Behavior, Cognition, Vision, Endurance, Motor  SLP Cognition, Nutrition  TR  Activity tolerance, functional mobility, balance, cognition, safety, pain, anxiety/stress       Basic ADL's: OT Grooming, Bathing, Dressing, Toileting     Advanced  ADL's: OT Simple Meal Preparation     Transfers: PT Bed Mobility, Bed to Chair, Car, Furniture, Futures trader, Metallurgist: PT Ambulation, Emergency planning/management officer, Stairs     Additional Impairments: OT Fuctional Use of Upper Extremity  SLP Swallowing, Social Cognition   Problem Solving, Memory, Attention, Awareness, Social Interaction  TR      Anticipated Outcomes Item Anticipated Outcome  Self Feeding    Swallowing  Mod I    Basic self-care  supervision  Toileting  supervision   Bathroom Transfers supervision  Bowel/Bladder  cont  Transfers  supervision  Locomotion  supervision  Communication     Cognition  Supervision   Pain  Pain cxontrolled with prn meds and non med interventionw to allow pt to complete ADLs with Mod I assist  Safety/Judgment  maintain safety with cues   Therapy Plan: PT Intensity: Minimum of 1-2 x/day ,45 to 90 minutes PT Frequency: 5 out of 7 days PT Duration Estimated Length of Stay: 14-18 days OT Intensity: Minimum of 1-2 x/day, 45 to 90 minutes OT Frequency: 5 out of 7 days OT Duration/Estimated Length of Stay: 14-18days SLP Intensity: Minumum of 1-2 x/day, 30 to 90  minutes SLP Frequency: 5 out of 7 days SLP Duration/Estimated Length of Stay: 14-18 days  TR Duration/ELOS:  2 weeks TR Frequency:  Min 1 time per week >20 minutes        Team Interventions: Nursing Interventions Patient/Family Education, Pain Management, Medication Management, Discharge Planning, Psychosocial Support, Disease Management/Prevention, Dysphagia/Aspiration Precaution Training  PT interventions Ambulation/gait training, Training and development officer, Cognitive remediation/compensation, Discharge planning, Disease management/prevention, DME/adaptive equipment instruction, Functional mobility training, Neuromuscular re-education, Pain management, Patient/family education, Psychosocial support, Stair training, Therapeutic Activities, Therapeutic Exercise, UE/LE Strength taining/ROM, UE/LE Coordination activities, Wheelchair propulsion/positioning  OT Interventions Balance/vestibular training, Discharge planning, Self Care/advanced ADL retraining, Therapeutic Activities, UE/LE Coordination activities, Cognitive remediation/compensation, Functional mobility training, Patient/family education, Therapeutic Exercise, Visual/perceptual remediation/compensation, Community reintegration, Engineer, drilling, Neuromuscular re-education, Psychosocial support, UE/LE Strength taining/ROM  SLP Interventions Cognitive remediation/compensation, English as a second language teacher, Dysphagia/aspiration precaution training, Environmental controls, Functional tasks, Internal/external aids, Patient/family education, Therapeutic Activities  TR Interventions Recreation/leisure participation, Balance/Vestibular training, functional mobility, therapeutic activities, UE/LE strength/coordination, cognitive retraining/compensation, w/c mobility, community reintegration, pt/family education, adaptive equipment instruction/use, discharge planning, psychosocial support  SW/CM Interventions Discharge Planning, Astronomer, Patient/Family Education    Team Discharge Planning: Destination: PT-Home ,OT- Home , SLP-Home Projected Follow-up: PT-Home health PT, OT-  24 hour supervision/assistance, Home health OT, SLP-24 hour supervision/assistance, Outpatient SLP Projected Equipment Needs: PT-To be determined, OT- To be determined, SLP-None recommended by SLP Equipment Details: PT-anticipate patient will need RW, OT-  Patient/family involved in discharge planning: PT- Patient,  OT-Patient, SLP-Patient  MD ELOS: 14-18 days Medical Rehab Prognosis:  Excellent Assessment: The patient has been admitted for  CIR therapies with the diagnosis of CVA. The team will be addressing functional mobility, strength, stamina, balance, safety, adaptive techniques and equipment, self-care, bowel and bladder mgt, patient and caregiver education, swallowing, cognition, communication, NMR, stroke education, ego support, leisure awareness. Goals have been set at supervision for mobility, self-care and cognition/swallowing.    Meredith Staggers, MD, FAAPMR      See Team Conference Notes for weekly updates to the plan of care

## 2014-10-06 NOTE — Care Management Note (Signed)
Inpatient [place] Individual Statement of Services  Patient Name:  [name] [name]  Date:  10/06/2014  Welcome to the [facility].  Our goal is to provide you with an individualized program based on your diagnosis and situation, designed to meet your specific needs.  With this comprehensive rehabilitation program, you will be expected to participate in at least 3 hours of rehabilitation therapies Monday-Friday, with modified therapy programming on the weekends.  Your rehabilitation program will include the following services:  Physical Therapy (PT), Occupational Therapy (OT), Speech Therapy (ST), 24 hour per day rehabilitation nursing, Therapeutic Recreaction (TR), Neuropsychology, Case Management (Social Worker), Rehabilitation Medicine, Nutrition Services and Pharmacy Services  Weekly team conferences will be held on Wednesday to discuss your progress.  Your Social Worker will talk with you frequently to get your input and to update you on team discussions.  Team conferences with you and your family in attendance may also be held  Expected length of stay: 14-18 days  Overall anticipated outcome: supervision with cues  Depending on your progress and recovery, your program may change. Your Social Worker will coordinate services and will keep you informed of any changes. Your Social Worker's name and contact numbers are listed  below.  The following services may also be recommended but are not provided by the [facility] will be made to provide these services after discharge if needed.  Arrangements include referral to agencies that provide these services.  Your insurance has been verified to be:  [place] Your primary doctor is:  Lita Mains  Pertinent information will be shared with your doctor and  your insurance company.  Social Worker:  Ovidio Kin, [place] or [phone]  Information discussed with and copy given to patient by: Elease Hashimoto, 10/06/2014, 2:51 PM

## 2014-10-06 NOTE — Plan of Care (Signed)
Problem: RH BLADDER ELIMINATION Goal: RH STG MANAGE BLADDER WITH ASSISTANCE STG Manage Bladder With Mod I Assistance  Outcome: Completed/Met Date Met:  10/06/14  Problem: RH SAFETY Goal: RH STG ADHERE TO SAFETY PRECAUTIONS W/ASSISTANCE/DEVICE STG Adhere to Safety Precautions With min Assistance/Device or cues.  Outcome: Progressing  Problem: RH PAIN MANAGEMENT Goal: RH STG PAIN MANAGED AT OR BELOW PT'S PAIN GOAL Pt will maintain a pain score of 5 or less with PRN pain medications. Outcome: Progressing

## 2014-10-07 ENCOUNTER — Inpatient Hospital Stay (HOSPITAL_COMMUNITY): Payer: Managed Care, Other (non HMO) | Admitting: Speech Pathology

## 2014-10-07 ENCOUNTER — Inpatient Hospital Stay (HOSPITAL_COMMUNITY): Payer: Managed Care, Other (non HMO) | Admitting: Occupational Therapy

## 2014-10-07 ENCOUNTER — Inpatient Hospital Stay (HOSPITAL_COMMUNITY): Payer: Managed Care, Other (non HMO) | Admitting: Physical Therapy

## 2014-10-07 LAB — HEMOGLOBIN AND HEMATOCRIT, BLOOD
HEMATOCRIT: 23.9 % — AB (ref 36.0–46.0)
HEMOGLOBIN: 8.1 g/dL — AB (ref 12.0–15.0)

## 2014-10-07 NOTE — Progress Notes (Signed)
[name] [name] is a 53 y.o. female [birth year] [id number]  Subjective: No new complaints. No new problems. Slept well. Feeling OK.  Objective: Vital signs in last 24 hours: Temp:  [98 F (36.7 C)-99.3 F (37.4 C)] 99.3 F (37.4 C) (12/05 0506) Pulse Rate:  [88-99] 99 (12/05 0804) Resp:  [17-18] 18 (12/05 0506) BP: (140-156)/(60-78) 143/60 mmHg (12/05 0804) SpO2:  [98 %-99 %] 99 % (12/05 0506) Weight change:  Last BM Date: 10/06/14  Intake/Output from previous day: 12/04 0701 - 12/05 0700 In: 360 [P.O.:360] Out: -  Last cbgs: CBG (last 3)   Recent Labs  10/05/14 1059  GLUCAP 146*     Physical Exam General: No apparent distress.  Appears depressed HEENT: not dry Lungs: Normal effort. Lungs clear to auscultation, no crackles or wheezes. Cardiovascular: Regular rate and rhythm, no edema Abdomen: S/NT/ND; BS(+) Musculoskeletal:  unchanged Neurological: No new neurological deficits Wounds: clean    Skin: clear  Aging changes Mental state: Alert, oriented, cooperative    Lab Results: BMET    Component Value Date/Time   NA 139 10/06/2014 0611   K 4.3 10/06/2014 0611   CL 108 10/06/2014 0611   CO2 15* 10/06/2014 0611   GLUCOSE 88 10/06/2014 0611   BUN 49* 10/06/2014 0611   CREATININE 3.04* 10/06/2014 0611   CREATININE 2.10* 03/01/2012 1123   CREATININE 0.35* 10/23/2008 2300   CALCIUM 9.1 10/06/2014 0611   GFRNONAA 17* 10/06/2014 0611   GFRAA 19* 10/06/2014 0611   CBC    Component Value Date/Time   WBC 8.1 10/06/2014 0611   WBC 5.5 03/01/2012 1128   RBC 3.00* 10/06/2014 0611   RBC 3.56* 03/01/2012 1128   HGB 8.2* 10/06/2014 0611   HGB 9.0* 03/01/2012 1128   HCT 25.0* 10/06/2014 0611   HCT 29.3* 03/01/2012 1128   PLT 216 10/06/2014 0611   MCV 83.3 10/06/2014 0611   MCV 82.4 03/01/2012 1128   MCH 27.3 10/06/2014 0611   MCH 25.3* 03/01/2012 1128   MCHC 32.8 10/06/2014 0611   MCHC 30.7* 03/01/2012 1128   RDW 14.1 10/06/2014 0611   LYMPHSABS 1.0  10/06/2014 0611   MONOABS 0.4 10/06/2014 0611   EOSABS 0.1 10/06/2014 0611   BASOSABS 0.0 10/06/2014 0611    Studies/Results: No results found.  Medications: I have reviewed the patient's current medications.  Assessment/Plan:  1. 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 DVT Prophylaxis/Anticoagulation: Mechanical: Sequential compression devices, below knee Bilateral lower extremities 3. Chronic pain/Pain Management: Alternate heat with ice to neck in addition to oxycodone prn. Therapy to work on ROM for neck. Keep pillow on head/neck to support and stretch -add low dose zanaflex as well. 4. Mood: More alert today. LCSW to follow for evaluation and support. Consider an antidepressant 5. Neuropsych: This patient is capable of making decisions on his own behalf. 6. Skin/Wound Care: Routine pressure relief measures.  7. Fluids/Electrolytes/Nutrition: Monitor I/O. Will add supplements to help with hydration and intake. Check lyes in am.  8. HTN: Monitor qid with SBP goal below 180. Continue lasix, Normodyne and Norvasc.  -bp's improving 9. Lupus: Has not seen rheum (Dr. Mearl Latin) for the past year and question compliance with medications. Continue Cellcept bid.  10. Acute on chronic CKD: Baseline BUN/Cr- 38/3.20 at admission. Encourage po fluid intake.  11. ABLA: Has had drop in Hgb 11.6--> 9.5-->8.5-->7.7. Continue iron supplement.  -hgb back up to 8.2 today -need a stool guaiac  12. Dyslipidemia:  On Lipitor daily.  13. Medical non-compliance: Counsel/Educate patient on importance of adhering to medication regimen.  14. Leucocytosis: Likely reactive. Will check UA/UCS.  15. Anemia. Monitor CBC  Cont Rx    Length of stay, days: 2  Walker Kehr , MD 10/07/2014, 9:36 AM

## 2014-10-07 NOTE — Progress Notes (Signed)
Physical Therapy Session Note  Patient Details  Name: [name] [name] MRN: [id number] Date of Birth: 1961/10/05  Today's Date: 10/07/2014 PT Individual Time: 0900-1000 PT Individual Time Calculation (min): 60 min   Short Term Goals: Week 1:  PT Short Term Goal 1 (Week 1): Pt will perform bed <> w/c transfers with min A. PT Short Term Goal 2 (Week 1): Pt will perform bed mobility with min A. PT Short Term Goal 3 (Week 1): Pt will ambulate 100 ft using RW with min A.  PT Short Term Goal 4 (Week 1): Pt negotiate up/down 4 stairs using 1 rail with min A.   Skilled Therapeutic Interventions/Progress Updates:  Pt was seen bedside in the am sitting up in w/c. Pt had difficulty maintaining arousal throughout treatment, pt would respond and attempt to participate but would quickly drift off when not engaged. Pt performed w/c mobility with mod A about 20 feet with B UEs and constant verbal cues. Pt transferred sit to stand with rolling walker and max A, pt tolerated standing about 15 seconds with max A. Pt transferred w/c to edge of mat and edge of mat to supine with max A and constant verbal cues. While supine on mat performed AAROM to B LEs for flexibility and strengthening. Pt transferred supine to edge of mat with total A. Pt tolerated edge of mat about 5 minutes with min guard to min A. Pt transferred edge of mat to w/c with max A, during transfer pt momentarily froze requiring constant verbal cues to complete transfer. Pt returned to room. Pt transferred w/c to edge of bed and edge of bed to supine with max A and constant verbal cues. Pt quickly drifted off once back in bed. Notified pt's tech would recommend assist x 2 for transfers at this time.   Therapy Documentation Precautions:  Precautions Precautions: Fall Restrictions Weight Bearing Restrictions: No General:  Pain: Pt stated she had no c/o pain, however during treatment c/o pain with any movement.  See FIM for current functional  status  Therapy/Group: Individual Therapy  Dub Amis 10/07/2014, 12:38 PM

## 2014-10-07 NOTE — Progress Notes (Signed)
Occupational Therapy Session Note  Patient Details  Name: [name] [name] MRN: [id number] Date of Birth: [birth year]  Today's Date: 10/07/2014 OT Individual Time: 8242-3536 and 1500-1530 60 min and 30 min   Short Term Goals: Week 1:  OT Short Term Goal 1 (Week 1): Pt will perform toilet transfer with Min A in order to increase I in functional transfers.  OT Short Term Goal 2 (Week 1): Pt will perform shower tranfer with Min a in order to increase I in functional transfers. OT Short Term Goal 3 (Week 1): Pt will perform LB dressing with Min A in order to increase I in self care. OT Short Term Goal 4 (Week 1): Pt will perform bathing with Min A in order to increase I in self care. OT Short Term Goal 5 (Week 1): Pt will perform UB dressing with Min a in order to increase I in self care.   Skilled Therapeutic Interventions/Progress Updates:  Session 1:Upon entering the room, pt supine in bed sleeping. Pt with 5/10 c/o pain this session generalized as "all over".  Pt very lethargic during session requiring max verbal cues to attend to tasks. Pt required Max A for bed mobility supine >sit. Squat pivot with Max A - total A to wheelchair as pt became very resistive to movements. Pt engaged in bathing and dressing seated in wheelchair at sink side with Mod -total A for all self care today. STS x3 with Max A for LB bathing and dressing. Pt unable to follow verbal cues for hemiplegic dressing as she continues to fall asleep. UB dressing requiring total A secondary to being lethargic. Pt seated in wheelchair with call bell, QRB donned, and breakfast tray set up in front of her upon exiting the room.   Session 2: Upon entering room, pt ending session with SLP and transitioning easily to OT session. R hand noted to have edema present as well as bruising. Pt may also be having possible lupus flare up at this time as pt having significant decline in functional status today. Pt seated in wheelchair for self feed to  continue. OT provided red built up utensil grip for feeding attempts. Pt continued to require max verbal cues during session secondary to lethargy. Pt falling asleep with food in mouth and being made full supervision by SLP for meals to increase safety. Pt able to get spoon to mouth 1/5 attempts successfully with R hand. R hand requiring hand over hand assistance to get drink to mouth. OT assisted pt back to bed with bobath technique with total A as pt very resistive with movements or fall asleep second person utilized for safety. Pt supine in bed with bed alarm on and call bell within reach.  Therapy Documentation Precautions:  Precautions Precautions: Fall Restrictions Weight Bearing Restrictions: No Pain: Pain Assessment Pain Assessment: 0-10 Pain Score: 5  Pain Type: Chronic pain Pain Location: Generalized Pain Descriptors / Indicators: Aching Pain Frequency: Constant Pain Onset: On-going Patients Stated Pain Goal: 3 Pain Intervention(s): Medication (See eMAR)  See FIM for current functional status  Therapy/Group: Individual Therapy  Phineas Semen 10/07/2014, 8:49 AM

## 2014-10-07 NOTE — Progress Notes (Signed)
Speech Language Pathology Daily Session Note  Patient Details  Name: [name] [name] MRN: [id number] Date of Birth: 02-Oct-1961  Today's Date: 10/07/2014 SLP Individual Time: 1430-1500 SLP Individual Time Calculation (min): 30 min  Short Term Goals: Week 1: SLP Short Term Goal 1 (Week 1): Patient will consume regular textures and thin liquids with Mod I for use of safe swallow compensatory strategies SLP Short Term Goal 2 (Week 1): Patient will sustain attention to familiar tasks for 2-3 minutes with Mod vebral cues for redirection  SLP Short Term Goal 3 (Week 1): Patient will solve moderately complex problems with Mod verbal cues to self-monitor errors  SLP Short Term Goal 4 (Week 1): Patient will utilize external aids to assist with recall of daily information with Mod question cues SLP Short Term Goal 5 (Week 1): Patient will request help as needed with Mod question cues  Skilled Therapeutic Interventions: Skilled treatment session focused on addressing cognitive goals.  SLP facilitated session with 2+ assist with RN for transfer from bed to chair.  SLP also facilitated session with Max multimodal cues to sustained attention to basic self-feeding task for ~15 minutes.  Patient required increased cues and demonstrated mild oral holding today, recommend change in plan of care to full supervision for safety.     FIM:  Comprehension Comprehension Mode: Auditory Comprehension: 3-Understands basic 50 - 74% of the time/requires cueing 25 - 50%  of the time Expression Expression Mode: Verbal Expression: 3-Expresses basic 50 - 74% of the time/requires cueing 25 - 50% of the time. Needs to repeat parts of sentences. Social Interaction Social Interaction: 2-Interacts appropriately 25 - 49% of time - Needs frequent redirection. Problem Solving Problem Solving: 2-Solves basic 25 - 49% of the time - needs direction more than half the time to initiate, plan or complete simple  activities Memory Memory: 2-Recognizes or recalls 25 - 49% of the time/requires cueing 51 - 75% of the time FIM - Eating Eating Activity: 5: Set-up assist for open containers;5: Set-up assist for cut food;5: Needs verbal cues/supervision;4: Helper checks for pocketed food;4: Helper occasionally brings food to mouth;4: Help with managing cup/glass;3: Helper scoops food on utensil every scoop  Pain Pain Assessment Pain Assessment: 0-10 Pain Score: 9  Pain Type: Chronic pain Pain Location: Generalized Pain Descriptors / Indicators: Aching Pain Onset: On-going Pain Intervention(s): RN made aware;Repositioned Multiple Pain Sites: No  Therapy/Group: Individual Therapy  Carmelia Roller., [place] [phone]  [place] 10/07/2014, 3:13 PM

## 2014-10-07 NOTE — Plan of Care (Signed)
Problem: RH SAFETY Goal: RH STG ADHERE TO SAFETY PRECAUTIONS W/ASSISTANCE/DEVICE STG Adhere to Safety Precautions With min Assistance/Device or cues.  Outcome: Progressing  Problem: RH PAIN MANAGEMENT Goal: RH STG PAIN MANAGED AT OR BELOW PT'S PAIN GOAL Pt will maintain a pain score of 5 or less with PRN pain medications. Outcome: Progressing

## 2014-10-08 ENCOUNTER — Inpatient Hospital Stay (HOSPITAL_COMMUNITY): Payer: Managed Care, Other (non HMO) | Admitting: *Deleted

## 2014-10-08 DIAGNOSIS — M436 Torticollis: Secondary | ICD-10-CM

## 2014-10-08 LAB — OCCULT BLOOD X 1 CARD TO LAB, STOOL: Fecal Occult Bld: NEGATIVE

## 2014-10-08 NOTE — Plan of Care (Signed)
Problem: RH SAFETY Goal: RH STG ADHERE TO SAFETY PRECAUTIONS W/ASSISTANCE/DEVICE STG Adhere to Safety Precautions With min Assistance/Device or cues.  Outcome: Progressing  Problem: RH PAIN MANAGEMENT Goal: RH STG PAIN MANAGED AT OR BELOW PT'S PAIN GOAL Pt will maintain a pain score of 5 or less with PRN pain medications. Outcome: Progressing

## 2014-10-08 NOTE — Plan of Care (Signed)
Problem: RH PAIN MANAGEMENT Goal: RH STG PAIN MANAGED AT OR BELOW PT'S PAIN GOAL Pt will maintain a pain score of 5 or less with PRN pain medications. Outcome: Progressing  Problem: RH KNOWLEDGE DEFICIT Goal: RH STG INCREASE KNOWLEDGE OF HYPERTENSION Pt will be able to verbalize strategies to manage her blood pressure with medications and diet with cues.  Outcome: Progressing

## 2014-10-08 NOTE — Progress Notes (Signed)
Occupational Therapy Session Note  Patient Details  Name: [name] [name] MRN: [id number] Date of Birth: 1961-02-26  Today's Date: 10/08/2014 OT Individual Time:  -   1530-1600  (30 min)  Short Term Goals: Week 1:  OT Short Term Goal 1 (Week 1): Pt will perform toilet transfer with Min A in order to increase I in functional transfers.  OT Short Term Goal 2 (Week 1): Pt will perform shower tranfer with Min a in order to increase I in functional transfers. OT Short Term Goal 3 (Week 1): Pt will perform LB dressing with Min A in order to increase I in self care. OT Short Term Goal 4 (Week 1): Pt will perform bathing with Min A in order to increase I in self care. OT Short Term Goal 5 (Week 1): Pt will perform UB dressing with Min a in order to increase I in self care.   Skilled Therapeutic Interventions/Progress Updates:    Pt. Lying in bed upon OT arrival.  Pt went from supine to sit with mod assist.  Engaged in LUE NMRE with AROM, AAROM.  Provided exercises for pt to work Left hand with flexion and  Extension.  Pt. Verbalized understanding.  Pt. returned to supine and assisted pt to get in comfortable position.   Therapy Documentation Precautions:  Precautions Precautions: Fall Restrictions Weight Bearing Restrictions: No    Pain:  Some stiffness but no pain    See FIM for current functional status  Therapy/Group: Individual Therapy  Lisa Roca 10/08/2014, 12:52 PM

## 2014-10-08 NOTE — Progress Notes (Signed)
[name] [name] is a 53 y.o. female [birth year] [id number]  Subjective: No new complaints. No new problems. Slept well. Had a BM  Objective: Vital signs in last 24 hours: Temp:  [98.3 F (36.8 C)-100.7 F (38.2 C)] 98.3 F (36.8 C) (12/06 0628) Pulse Rate:  [98-118] 98 (12/06 0628) Resp:  [16] 16 (12/06 0628) BP: (134-174)/(58-66) 134/59 mmHg (12/06 0628) SpO2:  [95 %-99 %] 97 % (12/06 0628) Weight change:  Last BM Date: 10/06/14  Intake/Output from previous day: 12/05 0701 - 12/06 0700 In: 37 [P.O.:60] Out: -  Last cbgs: CBG (last 3)   Recent Labs  10/05/14 1059  GLUCAP 146*     Physical Exam General: No apparent distress.  Appears depressed HEENT: not dry Lungs: Normal effort. Lungs clear to auscultation, no crackles or wheezes. Cardiovascular: Regular rate and rhythm, no edema Abdomen: S/NT/ND; BS(+) Musculoskeletal:  unchanged Neurological: No new neurological deficits, dysarthric Wounds: clean    Skin: clear  Aging changes Mental state: Alert, oriented, cooperative    Lab Results: BMET    Component Value Date/Time   NA 139 10/06/2014 0611   K 4.3 10/06/2014 0611   CL 108 10/06/2014 0611   CO2 15* 10/06/2014 0611   GLUCOSE 88 10/06/2014 0611   BUN 49* 10/06/2014 0611   CREATININE 3.04* 10/06/2014 0611   CREATININE 2.10* 03/01/2012 1123   CREATININE 0.35* 10/23/2008 2300   CALCIUM 9.1 10/06/2014 0611   GFRNONAA 17* 10/06/2014 0611   GFRAA 19* 10/06/2014 0611   CBC    Component Value Date/Time   WBC 8.1 10/06/2014 0611   WBC 5.5 03/01/2012 1128   RBC 3.00* 10/06/2014 0611   RBC 3.56* 03/01/2012 1128   HGB 8.1* 10/07/2014 1118   HGB 9.0* 03/01/2012 1128   HCT 23.9* 10/07/2014 1118   HCT 29.3* 03/01/2012 1128   PLT 216 10/06/2014 0611   MCV 83.3 10/06/2014 0611   MCV 82.4 03/01/2012 1128   MCH 27.3 10/06/2014 0611   MCH 25.3* 03/01/2012 1128   MCHC 32.8 10/06/2014 0611   MCHC 30.7* 03/01/2012 1128   RDW 14.1 10/06/2014 0611   LYMPHSABS  1.0 10/06/2014 0611   MONOABS 0.4 10/06/2014 0611   EOSABS 0.1 10/06/2014 0611   BASOSABS 0.0 10/06/2014 0611    Studies/Results: No results found.  Medications: I have reviewed the patient's current medications.  Assessment/Plan:  1. 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 DVT Prophylaxis/Anticoagulation: Mechanical: Sequential compression devices, below knee Bilateral lower extremities 3. Chronic pain/Pain Management: Alternate heat with ice to neck in addition to oxycodone prn. Therapy to work on ROM for neck. Keep pillow on head/neck to support and stretch -add low dose zanaflex as well. 4. Mood: More alert today. LCSW to follow for evaluation and support. Consider an antidepressant 5. Neuropsych: This patient is capable of making decisions on his own behalf. 6. Skin/Wound Care: Routine pressure relief measures.  7. Fluids/Electrolytes/Nutrition: Monitor I/O. Will add supplements to help with hydration and intake. Check lyes in am.  8. HTN: Monitor qid with SBP goal below 180. Continue lasix, Normodyne and Norvasc.  -bp's improving 9. Lupus: Has not seen rheum (Dr. Mearl Latin) for the past year and question compliance with medications. Continue Cellcept bid.  10. Acute on chronic CKD: Baseline BUN/Cr- 38/3.20 at admission. Encourage po fluid intake.  11. ABLA: Has had drop in Hgb 11.6--> 9.5-->8.5-->7.7. Continue iron supplement.  -hgb back up to 8.1-8.2  - stool guaiac (-) x1  12. Dyslipidemia: On Lipitor daily.  13. Medical non-compliance: Counsel/Educate patient on importance of adhering to medication regimen.  14. Leucocytosis: Likely reactive. Will check UA/UCS.  15. Anemia. Monitor CBC  Cont current Rx    Length of stay, days: 3  Walker Kehr , MD 10/08/2014, 8:07 AM

## 2014-10-09 ENCOUNTER — Inpatient Hospital Stay (HOSPITAL_COMMUNITY): Payer: Managed Care, Other (non HMO) | Admitting: Speech Pathology

## 2014-10-09 ENCOUNTER — Inpatient Hospital Stay (HOSPITAL_COMMUNITY): Payer: Managed Care, Other (non HMO) | Admitting: Occupational Therapy

## 2014-10-09 ENCOUNTER — Inpatient Hospital Stay (HOSPITAL_COMMUNITY): Payer: Managed Care, Other (non HMO) | Admitting: Rehabilitation

## 2014-10-09 ENCOUNTER — Encounter (HOSPITAL_COMMUNITY): Payer: Self-pay | Admitting: *Deleted

## 2014-10-09 NOTE — Progress Notes (Signed)
[facility] PHYSICAL MEDICINE & REHABILITATION     PROGRESS NOTE    Subjective/Complaints: Asleep. No new questions this am. Denies fever chills.  Objective: Vital Signs: Blood pressure 134/59, pulse 99, temperature 99.6 F (37.6 C), temperature source Oral, resp. rate 18, height 5' 5.5" (1.664 m), weight 66.5 kg (146 lb 9.7 oz), last menstrual period 07/18/2012, SpO2 95 %. No results found.  Recent Labs  10/07/14 1118  HGB 8.1*  HCT 23.9*   No results for input(s): NA, K, CL, GLUCOSE, BUN, CREATININE, CALCIUM in the last 72 hours.  Invalid input(s): CO CBG (last 3)  No results for input(s): GLUCAP in the last 72 hours.  Wt Readings from Last 3 Encounters:  10/05/14 66.5 kg (146 lb 9.7 oz)  09/28/14 64.6 kg (142 lb 6.7 oz)  09/04/12 61.236 kg (135 lb)    Physical Exam:  Constitutional: She is oriented to person, place, and time. She appears well-developed and well-nourished.   HENT: dentition fair Head: Normocephalic and atraumatic.  Blistered areas on tip of tongue--non tender  Eyes: Conjunctivae are normal. Pupils are equal, round, and reactive to light.  Neck: Muscular tenderness present. Decreased range of motion present. right torticollis Cardiovascular: Regular rhythm. Tachycardia present.  Respiratory: Effort normal. She has decreased breath sounds in the right lower field and the left lower field.  GI: Soft. Bowel sounds are normal.  Musculoskeletal: She exhibits no edema or tenderness. Head still rotated rightward Neurological: She is oriented to person, place, and time.  Left inattention. Able to state day, month as well as medical questions. Able to follow basic commands without difficulty. Inconsistent RUE weakness --poor effort. LLE weakness with dysesthesias. Sensation 1/2 LUE and LLE with PP and LT. Left PD. intermittent tone LUE. Has poor awareness and insight into deficits.  Skin: Skin is warm and dry.  Psychiatric: Her speech is normal. Her  affect is blunt. She is inattentive still.   Assessment/Plan: 1. Functional deficits secondary to right paramedian pontine infarct which require 3+ hours per day of interdisciplinary therapy in a comprehensive inpatient rehab setting. Physiatrist is providing close team supervision and 24 hour management of active medical problems listed below. Physiatrist and rehab team continue to assess barriers to discharge/monitor patient progress toward functional and medical goals. FIM: FIM - Bathing Bathing Steps Patient Completed: Chest, Abdomen, Front perineal area, Right upper leg, Left upper leg Bathing: 3: Mod-Patient completes 5-7 76f 10 parts or 50-74%  FIM - Upper Body Dressing/Undressing Upper body dressing/undressing steps patient completed: Thread/unthread right bra strap, Thread/unthread left bra strap, Thread/unthread right sleeve of pullover shirt/dresss, Thread/unthread left sleeve of pullover shirt/dress Upper body dressing/undressing: 1: Total-Patient completed less than 25% of tasks FIM - Lower Body Dressing/Undressing Lower body dressing/undressing steps patient completed: Thread/unthread right underwear leg, Thread/unthread right pants leg Lower body dressing/undressing: 2: Max-Patient completed 25-49% of tasks        FIM - Control and instrumentation engineer Devices: Arm rests Bed/Chair Transfer: 1: Two helpers, 1: Chair or W/C > Bed: Total A (helper does all/Pt. < 25%)  FIM - Locomotion: Wheelchair Distance: 150 Locomotion: Wheelchair: 1: Travels less than 50 ft with moderate assistance (Pt: 50 - 74%) FIM - Locomotion: Ambulation Locomotion: Ambulation Assistive Devices: Administrator Ambulation/Gait Assistance: 4: Min assist, 3: Mod assist Locomotion: Ambulation: 1: Travels less than 50 ft with moderate assistance (Pt: 50 - 74%)  Comprehension Comprehension Mode: Auditory Comprehension: 3-Understands basic 50 - 74% of the time/requires cueing 25 - 50%  of the time  Expression Expression Mode: Verbal Expression: 3-Expresses basic 50 - 74% of the time/requires cueing 25 - 50% of the time. Needs to repeat parts of sentences.  Social Interaction Social Interaction: 2-Interacts appropriately 25 - 49% of time - Needs frequent redirection.  Problem Solving Problem Solving: 2-Solves basic 25 - 49% of the time - needs direction more than half the time to initiate, plan or complete simple activities  Memory Memory: 2-Recognizes or recalls 25 - 49% of the time/requires cueing 51 - 75% of the time  Medical Problem List and Plan: 1. 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 DVT Prophylaxis/Anticoagulation: Mechanical: Sequential compression devices, below knee Bilateral lower extremities 3. Chronic pain/Pain Management: Alternate heat with ice to neck in addition to oxycodone prn. Therapy to work on ROM for neck. Keep pillow on head/neck to support and stretch  -added low dose zanaflex as well---watch for sedation 4. Mood: More alert today. LCSW to follow for evaluation and support.  5. Neuropsych: This patient is capable of making decisions on his own behalf. 6. Skin/Wound Care: Routine pressure relief measures.  7. Fluids/Electrolytes/Nutrition: Monitor I/O. Will add supplements to help with hydration and intake. Check lyes in am.  8. HTN: Monitor qid with SBP goal below 180. Continue lasix, Normodyne and Norvasc.   -bp's improving 9. Lupus: Has not seen rheum (Dr. Mearl Latin) for the past year and question compliance with medications. Continue Cellcept bid.  10. Acute on chronic CKD: Baseline BUN/Cr- 38/3.20 at admission. Encourage po fluid intake.  11. ABLA: Has had drop in Hgb 11.6--> 9.5-->8.5-->7.7. Continue iron supplement.   -hgb at 8.1 over weekend---recheck tomorrow  -need a stool guaiac  12. Dyslipidemia: On Lipitor daily.  13. Medical non-compliance:  Counsel/Educate patient on importance of adhering to medication regimen.  14. Leucocytosis: ucx 30k only  -encourage IS, OOB LOS (Days) 4 A FACE TO FACE EVALUATION WAS PERFORMED  [name] [name] 10/09/2014 7:58 AM

## 2014-10-09 NOTE — Progress Notes (Signed)
Recreational Therapy Assessment and Plan  Patient Details  Name: [name] [name] MRN: [id number] Date of Birth: Aug 12, 1961 Today's Date: 10/09/2014  Rehab Potential: Good ELOS: 2 weeks   Assessment Clinical Impression: Problem List:  Patient Active Problem List   Diagnosis Date Noted  . Torticollis, acquired 10/06/2014  . Essential hypertension 10/05/2014  . Acute respiratory acidosis   . ICH (intracerebral hemorrhage)   . Chronic ankle pain   . Cerebral hemorrhage   . Malignant hypertension   . Stroke 09/28/2014  . 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 Hypertensive emergency 09/28/2014  . ANEMIA, IRON DEFICIENCY 02/18/2008  . 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 ADRENAL INSUFFICIENCY, HX OF 02/18/2008  . DIABETES MELLITUS, TYPE II 10/25/2007  . HYPERLIPIDEMIA 10/25/2007  . UNSPECIFIED ULCERATION OF VULVA 10/25/2007  . Systemic lupus erythematosus 10/25/2007  . RHEUMATOID ARTHRITIS 10/25/2007  . HYPERTENSION NEC 10/25/2007  . COLONIC POLYPS, HYPERPLASTIC 04/08/2006  . HEMORRHOIDS 04/08/2006    Past Medical History:  Past Medical History  Diagnosis Date  . Hypertension   . Lupus   . CKD (chronic kidney disease)     due to lupus/Dr. [name]  . Diabetes mellitus   . Stenosis of cervical spine region     with HNP at C5/6, C6/7  . Metatarsal bone fracture right    4th  . 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   Past Surgical History:  Past Surgical History  Procedure Laterality Date  . Breast biopsy      Assessment & Plan Clinical Impression: [name] [name] is a 53 y.o. female with history of HTN, hyperlipidemia, lupus with mild LLE weakness as well as compliance issues who was admitted on 09/28/14 with acute onset left sided weakness and numbness as well as gaze to the right side. Patient reported being out of BP meds for 3 weeks and BP  238/88 at admission. UDS negative. 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 Carotid dopplers significant for R-40-59% ICA stenosis. ST evaluation with high level cognitive deficits affecting STM as well as solving/reasoning especially in math area. Blood pressure remains labile requiring Cardene drip, neck, back and ankle pain as well as issues with fluctuating bouts of lethargy. Follow up MRI/MRA brain done today revealing changes c/w acute/subacute hemorrhage in right parmedian pons and no underlying mass evident. Minimal diffusion abnormality in high frontal lobes bilaterally likely due to calcification and probably meningioma.  She has worsening of respiratory status on 12/01 with difficulty handling secretions and complaints of severe throat pain. She was made NPO and CXR was ordered revealing fluid overload. CCM was consulted for input and patient was treated with IV diuresis and has had improvement in symptoms. Lisinopril was dicontinued due to of renal status and she was treated with IV bicarb for acidosis. Repeat cardiac echo showed EF 60-70% with AV sclerosis without stenosis. FEES done yesterday showing mild dysphagia with pentration due to large boluses but no aspiration therefore she was started on regular textures with thin liquids. Patient with resultant left sided weakness impacting mobility as well as safety. Patient transferred to CIR on 10/05/2014.   Pt presents with decreased activity tolerance, decreased functional mobility, decreased balance, ataxia, decreased coordination, left inattention, decreased attention, decreased awareness, decreased problem solving, decreased safety awareness and decreased memory Limiting pt's independence with leisure/community pursuits.    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, so busy with activities with them, recently joined a gym, walking) Other Leisure Interests: Television Leisure Participation Style: With Family/Friends Psychosocial / Spiritual Social interaction - Mood/Behavior: Cooperative Academic librarian Appropriate for Education?: Yes Recreational Therapy Orientation Orientation -Reviewed with patient: Available activity resources Strengths/Weaknesses Patient Strengths/Abilities: Willingness to participate;Active premorbidly Patient weaknesses: Physical limitations TR Patient demonstrates impairments in the following area(s): Motor;Endurance;Edema;Pain;Safety;Skin Integrity  Plan Rec Therapy Plan Is patient appropriate for Therapeutic Recreation?: Yes Rehab Potential: Good Treatment times per week: Min 1 time per week >20 minutes Estimated Length of Stay: 2 weeks TR Treatment/Interventions: Adaptive equipment instruction;1:1 session;Balance/vestibular training;Functional mobility training;Community reintegration;Cognitive remediation/compensation;Patient/family education;Therapeutic activities;Recreation/leisure participation;Provide activity resources in room;Therapeutic exercise;UE/LE Coordination activities;Wheelchair propulsion/positioning Recommendations for other services: Neuropsych  Recommendations for other services: Neuropsych  Discharge Criteria: Patient will be discharged from TR if patient refuses treatment 3 consecutive times without medical reason.  If treatment goals not met, if there is a change in medical status, if patient makes no progress towards goals or if patient is discharged from hospital.  The above assessment, treatment plan, treatment alternatives and goals were discussed and mutually agreed upon: by patient  [place] 10/09/2014, 10:52 AM

## 2014-10-09 NOTE — Plan of Care (Signed)
Problem: RH SAFETY Goal: RH STG ADHERE TO SAFETY PRECAUTIONS W/ASSISTANCE/DEVICE STG Adhere to Safety Precautions With min Assistance/Device or cues.  Outcome: Progressing  Problem: RH PAIN MANAGEMENT Goal: RH STG PAIN MANAGED AT OR BELOW PT'S PAIN GOAL Pt will maintain a pain score of 5 or less with PRN pain medications. Outcome: Progressing

## 2014-10-09 NOTE — Progress Notes (Signed)
Instructed pt on use of Flutter Valve.  Pt stated she knows how to use.  She has used one in ICU

## 2014-10-09 NOTE — Progress Notes (Signed)
Social Work Patient ID: [name] [name], female   DOB: 1961-02-26, 53 y.o.   MRN: [id number] Met with sister-Joan to get pt's paperwork from work to be completed, have left for Pam-PA to complete. Discussed pt will require 24 hr supervision level at discharge, will contact Wed to inform of team conference and targeted date. They plan to take time off and arrange 24 hr care for pt at discharge.

## 2014-10-09 NOTE — Progress Notes (Signed)
Physical Therapy Session Note  Patient Details  Name: [name] [name] MRN: [id number] Date of Birth: April 05, 1961  Today's Date: 10/09/2014 PT Individual Time: 6606-3016 PT Individual Time Calculation (min): 63 min   Short Term Goals: Week 1:  PT Short Term Goal 1 (Week 1): Pt will perform bed <> w/c transfers with min A. PT Short Term Goal 2 (Week 1): Pt will perform bed mobility with min A. PT Short Term Goal 3 (Week 1): Pt will ambulate 100 ft using RW with min A.  PT Short Term Goal 4 (Week 1): Pt negotiate up/down 4 stairs using 1 rail with min A.   Skilled Therapeutic Interventions/Progress Updates:   Pt received lying in bed, notably fatigued, but agreeable to therapy session.  Sister present during session to observe.  Pt performed bed mobility with HOB flat and without rails at max A level with total A continuous multimodal cues for sequencing and technique to get to EOB.  Pt with decreased movement in LLE, therefore requires assist with c/o pain in L knee.  Once at [facility], educated on scooting to front of bed so that feet are on floor prior to transfer.  Then performed squat pivot transfer to w/c with mod/max A.  Requires continuous max verbal cues and facilitation for forward weight shift for improved buttocks clearance during transfer.  Also provided cues for using LUE to reach for chair during transfer.  Once in chair, had pt attempt w/c propulsion x 50' with R hemi technique.  Pt with increased difficulty motor planning and coordinating movements of RUE and LE together and note difficulty reaching RLE to ground, therefore donned shoes once in hallway to assist.  Performed gait x 30' with RW at mod A level.  Pt requires increased facilitation for forward weight shift to stand with max verbal cues for hand placement throughout session.  Note that she continues to keep LLE in extension, despite cues for flexion during swing phase of gait.  Also note difficulty with placement of LLE and narrow  BOS.  Provided visual/verbal cues for "aim R foot to R wheel."  Pt unable to maintain during gait.  Once in therapy gym on mat, donned 2LB ankle weight to LLE in order to perform standing stepping task to 4" step with BUE support on rails.  Pt able to perform, however note hip IR and foot moving out before actually getting foot to target.  RT joined session to continue to work on standing task without RW for forward weight shift as she tends to keep weight posteriorly and to the R.  Provided visual target of soccer ball for her to "bring belly to ball."  Pt able to perform x 15 reps with initially max assist fading to min A.  Provided pt with RW to then work on kicking LLE to ball.  Performed x 5 reps.  Transferred back to w/c with RW at mod A level.  Assisted pt back to room and left in w/c with quick release belt donned and all needs in reach.   Therapy Documentation Precautions:  Precautions Precautions: Fall Restrictions Weight Bearing Restrictions: No  Pain: Pain Assessment Pain Score: 4    Locomotion : Ambulation Ambulation/Gait Assistance: 3: Mod assist Wheelchair Mobility Distance: 50   See FIM for current functional status  Therapy/Group: Individual Therapy and co-treat with RT during last part of session.   Denice Bors 10/09/2014, 10:53 AM

## 2014-10-09 NOTE — Plan of Care (Signed)
Problem: RH SAFETY Goal: RH STG ADHERE TO SAFETY PRECAUTIONS W/ASSISTANCE/DEVICE STG Adhere to Safety Precautions With min Assistance/Device or cues.  Outcome: Progressing  Problem: RH PAIN MANAGEMENT Goal: RH STG PAIN MANAGED AT OR BELOW PT'S PAIN GOAL Pt will maintain a pain score of 5 or less with PRN pain medications. Outcome: Progressing  Problem: RH KNOWLEDGE DEFICIT Goal: RH STG INCREASE KNOWLEDGE OF DIABETES Pt will be able to demonstrate understanding of diet and food choices to manage diabetes.  Outcome: Not Progressing Patient is too drowsy for education    Goal: RH STG INCREASE KNOWLEDGE OF HYPERTENSION Pt will be able to verbalize strategies to manage her blood pressure with medications and diet with cues.  Outcome: Not Progressing

## 2014-10-09 NOTE — Progress Notes (Signed)
Speech Language Pathology Daily Session Note  Patient Details  Name: [name] [name] MRN: [id number] Date of Birth: 20-Aug-1961  Today's Date: 10/09/2014 SLP Individual Time: 1101-1208 SLP Individual Time Calculation (min): 67 min  Short Term Goals: Week 1: SLP Short Term Goal 1 (Week 1): Patient will consume regular textures and thin liquids with Mod I for use of safe swallow compensatory strategies SLP Short Term Goal 2 (Week 1): Patient will sustain attention to familiar tasks for 2-3 minutes with Mod vebral cues for redirection  SLP Short Term Goal 3 (Week 1): Patient will solve moderately complex problems with Mod verbal cues to self-monitor errors  SLP Short Term Goal 4 (Week 1): Patient will utilize external aids to assist with recall of daily information with Mod question cues SLP Short Term Goal 5 (Week 1): Patient will request help as needed with Mod question cues  Skilled Therapeutic Interventions:  Pt was seen for skilled ST targeting cognitive goals.  Upon arrival, pt was seated upright in wheelchair with family present, awake, lethargic, but agreeable to participate in ST with encouragement.  SLP facilitated the session with a basic money management task targeting sustained attention and functional problem solving for organization and error awareness.  Pt sorted coins into groups by value with min assist cues for alertness.  SLP increased task challenge with pt requiring overall mod assist verbal cues to generate values with coins when named; mod-max assist to make change when given two different values.  SLP provided skilled family education related to the cognitive hierarchy and how alertness and attention impact all higher level cognitive processes.   Upon return to room, pt was very anxious when transferring from wheelchair to bed, stating that she felt she was going to have a "panic attack" and required max encouragement and mod cues for sequencing and safety awareness to safely get  to bed.  Pt left with family member present, 3 bed rails up, and bed alarm activated.  Continue per current plan of care.    FIM:  Comprehension Comprehension Mode: Auditory Comprehension: 4-Understands basic 75 - 89% of the time/requires cueing 10 - 24% of the time Expression Expression Mode: Verbal Expression: 3-Expresses basic 50 - 74% of the time/requires cueing 25 - 50% of the time. Needs to repeat parts of sentences. Social Interaction Social Interaction: 3-Interacts appropriately 50 - 74% of the time - May be physically or verbally inappropriate. Problem Solving Problem Solving: 3-Solves basic 50 - 74% of the time/requires cueing 25 - 49% of the time Memory Memory: 2-Recognizes or recalls 25 - 49% of the time/requires cueing 51 - 75% of the time  Pain Pain Assessment Pain Assessment: No/denies pain  Therapy/Group: Individual Therapy   Windell Moulding, M.A. [name]  Danylah Holden, Selinda Orion 10/09/2014, 12:46 PM

## 2014-10-09 NOTE — Progress Notes (Signed)
Occupational Therapy Session Note  Patient Details  Name: [name] [name] MRN: [id number] Date of Birth: Jan 19, 1961  Today's Date: 10/09/2014 OT Individual Time: 1300-1400 OT Individual Time Calculation (min): 60 min    Short Term Goals: Week 1:  OT Short Term Goal 1 (Week 1): Pt will perform toilet transfer with Min A in order to increase I in functional transfers.  OT Short Term Goal 2 (Week 1): Pt will perform shower tranfer with Min a in order to increase I in functional transfers. OT Short Term Goal 3 (Week 1): Pt will perform LB dressing with Min A in order to increase I in self care. OT Short Term Goal 4 (Week 1): Pt will perform bathing with Min A in order to increase I in self care. OT Short Term Goal 5 (Week 1): Pt will perform UB dressing with Min a in order to increase I in self care.   Skilled Therapeutic Interventions/Progress Updates:    Pt received supine in bed, requiring increased time for arousal and to respond to all verbal cues.  Pt continuing to drift back to sleep throughout session requiring constant verbal cues and attempts at movement to promote arousal.  Increased time for bed mobility and stand pivot to w/c with max multimodal cues and max assist (lift and lower) due to decreased motor planning and initiation.  Performed toilet transfer with use of grab bar with max verbal cues for sequencing of transfer and hand placement to promote participation in sit > stand.  +2 for safety with transfer secondary to pt anxiety and fearfulness of falling and +2 for clothing management.  Multimodal cues for upright standing to improve standing tolerance and balance.  Pt willing to participate in standing activity in therapy gym, however upon arrival pt reports too fatigued and requesting to return to bed.  Engaged in 1 additional sit > stand in gym with focus on hand placement and forward weight shift for sit > stand as well as increased weight distribution in standing.  Pt returned to  bed at end of session, sister present for 2nd half of session.  Therapy Documentation Precautions:  Precautions Precautions: Fall Restrictions Weight Bearing Restrictions: No Pain: Pain Assessment Pain Assessment: No/denies pain  See FIM for current functional status  Therapy/Group: Individual Therapy  Simonne Come 10/09/2014, 2:15 PM

## 2014-10-10 ENCOUNTER — Encounter (HOSPITAL_COMMUNITY): Payer: Managed Care, Other (non HMO) | Admitting: Occupational Therapy

## 2014-10-10 ENCOUNTER — Inpatient Hospital Stay (HOSPITAL_COMMUNITY): Payer: Managed Care, Other (non HMO) | Admitting: Physical Therapy

## 2014-10-10 ENCOUNTER — Inpatient Hospital Stay (HOSPITAL_COMMUNITY): Payer: Managed Care, Other (non HMO)

## 2014-10-10 ENCOUNTER — Encounter (HOSPITAL_COMMUNITY): Payer: Self-pay | Admitting: Nephrology

## 2014-10-10 ENCOUNTER — Inpatient Hospital Stay (HOSPITAL_COMMUNITY)
Admission: AD | Admit: 2014-10-10 | Discharge: 2014-10-17 | DRG: 871 | Payer: Managed Care, Other (non HMO) | Source: Intra-hospital | Attending: Internal Medicine | Admitting: Internal Medicine

## 2014-10-10 ENCOUNTER — Inpatient Hospital Stay (HOSPITAL_COMMUNITY): Payer: Managed Care, Other (non HMO) | Admitting: Speech Pathology

## 2014-10-10 DIAGNOSIS — N184 Chronic kidney disease, stage 4 (severe): Secondary | ICD-10-CM

## 2014-10-10 DIAGNOSIS — F17210 Nicotine dependence, cigarettes, uncomplicated: Secondary | ICD-10-CM | POA: Diagnosis present

## 2014-10-10 DIAGNOSIS — Z720 Tobacco use: Secondary | ICD-10-CM | POA: Insufficient documentation

## 2014-10-10 DIAGNOSIS — I619 Nontraumatic intracerebral hemorrhage, unspecified: Secondary | ICD-10-CM | POA: Diagnosis present

## 2014-10-10 DIAGNOSIS — N186 End stage renal disease: Secondary | ICD-10-CM | POA: Diagnosis present

## 2014-10-10 DIAGNOSIS — T464X5A Adverse effect of angiotensin-converting-enzyme inhibitors, initial encounter: Secondary | ICD-10-CM | POA: Diagnosis present

## 2014-10-10 DIAGNOSIS — I429 Cardiomyopathy, unspecified: Secondary | ICD-10-CM | POA: Diagnosis present

## 2014-10-10 DIAGNOSIS — M4802 Spinal stenosis, cervical region: Secondary | ICD-10-CM | POA: Diagnosis present

## 2014-10-10 DIAGNOSIS — A419 Sepsis, unspecified organism: Secondary | ICD-10-CM | POA: Diagnosis present

## 2014-10-10 DIAGNOSIS — J811 Chronic pulmonary edema: Secondary | ICD-10-CM | POA: Diagnosis present

## 2014-10-10 DIAGNOSIS — N052 Unspecified nephritic syndrome with diffuse membranous glomerulonephritis: Secondary | ICD-10-CM | POA: Diagnosis present

## 2014-10-10 DIAGNOSIS — R739 Hyperglycemia, unspecified: Secondary | ICD-10-CM | POA: Diagnosis present

## 2014-10-10 DIAGNOSIS — M1990 Unspecified osteoarthritis, unspecified site: Secondary | ICD-10-CM | POA: Diagnosis present

## 2014-10-10 DIAGNOSIS — T380X5A Adverse effect of glucocorticoids and synthetic analogues, initial encounter: Secondary | ICD-10-CM | POA: Diagnosis present

## 2014-10-10 DIAGNOSIS — E872 Acidosis: Secondary | ICD-10-CM | POA: Diagnosis present

## 2014-10-10 DIAGNOSIS — R52 Pain, unspecified: Secondary | ICD-10-CM

## 2014-10-10 DIAGNOSIS — I613 Nontraumatic intracerebral hemorrhage in brain stem: Secondary | ICD-10-CM | POA: Diagnosis present

## 2014-10-10 DIAGNOSIS — D509 Iron deficiency anemia, unspecified: Secondary | ICD-10-CM | POA: Diagnosis present

## 2014-10-10 DIAGNOSIS — N179 Acute kidney failure, unspecified: Secondary | ICD-10-CM | POA: Diagnosis present

## 2014-10-10 DIAGNOSIS — Z882 Allergy status to sulfonamides status: Secondary | ICD-10-CM

## 2014-10-10 DIAGNOSIS — Z9119 Patient's noncompliance with other medical treatment and regimen: Secondary | ICD-10-CM | POA: Diagnosis present

## 2014-10-10 DIAGNOSIS — D62 Acute posthemorrhagic anemia: Secondary | ICD-10-CM | POA: Insufficient documentation

## 2014-10-10 DIAGNOSIS — D589 Hereditary hemolytic anemia, unspecified: Secondary | ICD-10-CM | POA: Diagnosis present

## 2014-10-10 DIAGNOSIS — G9340 Encephalopathy, unspecified: Secondary | ICD-10-CM | POA: Diagnosis present

## 2014-10-10 DIAGNOSIS — M069 Rheumatoid arthritis, unspecified: Secondary | ICD-10-CM | POA: Diagnosis present

## 2014-10-10 DIAGNOSIS — M351 Other overlap syndromes: Secondary | ICD-10-CM | POA: Insufficient documentation

## 2014-10-10 DIAGNOSIS — I10 Essential (primary) hypertension: Secondary | ICD-10-CM | POA: Diagnosis present

## 2014-10-10 DIAGNOSIS — E876 Hypokalemia: Secondary | ICD-10-CM | POA: Diagnosis present

## 2014-10-10 DIAGNOSIS — Z9114 Patient's other noncompliance with medication regimen: Secondary | ICD-10-CM | POA: Diagnosis present

## 2014-10-10 DIAGNOSIS — E8770 Fluid overload, unspecified: Secondary | ICD-10-CM | POA: Insufficient documentation

## 2014-10-10 DIAGNOSIS — E785 Hyperlipidemia, unspecified: Secondary | ICD-10-CM | POA: Diagnosis present

## 2014-10-10 DIAGNOSIS — M3214 Glomerular disease in systemic lupus erythematosus: Secondary | ICD-10-CM | POA: Diagnosis present

## 2014-10-10 DIAGNOSIS — E871 Hypo-osmolality and hyponatremia: Secondary | ICD-10-CM | POA: Insufficient documentation

## 2014-10-10 DIAGNOSIS — D649 Anemia, unspecified: Secondary | ICD-10-CM | POA: Diagnosis present

## 2014-10-10 DIAGNOSIS — E875 Hyperkalemia: Secondary | ICD-10-CM | POA: Diagnosis present

## 2014-10-10 DIAGNOSIS — I120 Hypertensive chronic kidney disease with stage 5 chronic kidney disease or end stage renal disease: Secondary | ICD-10-CM | POA: Diagnosis present

## 2014-10-10 DIAGNOSIS — R6510 Systemic inflammatory response syndrome (SIRS) of non-infectious origin without acute organ dysfunction: Secondary | ICD-10-CM | POA: Diagnosis present

## 2014-10-10 DIAGNOSIS — N2581 Secondary hyperparathyroidism of renal origin: Secondary | ICD-10-CM | POA: Diagnosis present

## 2014-10-10 DIAGNOSIS — Z992 Dependence on renal dialysis: Secondary | ICD-10-CM

## 2014-10-10 DIAGNOSIS — N189 Chronic kidney disease, unspecified: Secondary | ICD-10-CM

## 2014-10-10 DIAGNOSIS — R4182 Altered mental status, unspecified: Secondary | ICD-10-CM | POA: Diagnosis present

## 2014-10-10 DIAGNOSIS — M329 Systemic lupus erythematosus, unspecified: Secondary | ICD-10-CM | POA: Insufficient documentation

## 2014-10-10 DIAGNOSIS — Z91148 Patient's other noncompliance with medication regimen for other reason: Secondary | ICD-10-CM | POA: Insufficient documentation

## 2014-10-10 DIAGNOSIS — D72829 Elevated white blood cell count, unspecified: Secondary | ICD-10-CM | POA: Diagnosis not present

## 2014-10-10 DIAGNOSIS — E559 Vitamin D deficiency, unspecified: Secondary | ICD-10-CM | POA: Diagnosis present

## 2014-10-10 DIAGNOSIS — M130 Polyarthritis, unspecified: Secondary | ICD-10-CM | POA: Diagnosis present

## 2014-10-10 HISTORY — DX: Unspecified osteoarthritis, unspecified site: M19.90

## 2014-10-10 HISTORY — DX: Anemia, unspecified: D64.9

## 2014-10-10 HISTORY — DX: Unspecified nephritic syndrome with diffuse membranous glomerulonephritis: N05.2

## 2014-10-10 HISTORY — DX: Cerebral infarction, unspecified: I63.9

## 2014-10-10 LAB — LACTATE DEHYDROGENASE: LDH: 182 U/L (ref 94–250)

## 2014-10-10 LAB — COMPREHENSIVE METABOLIC PANEL
ALT: 23 U/L (ref 0–35)
AST: 31 U/L (ref 0–37)
Albumin: 1.8 g/dL — ABNORMAL LOW (ref 3.5–5.2)
Alkaline Phosphatase: 111 U/L (ref 39–117)
Anion gap: 20 — ABNORMAL HIGH (ref 5–15)
BILIRUBIN TOTAL: 0.6 mg/dL (ref 0.3–1.2)
BUN: 74 mg/dL — ABNORMAL HIGH (ref 6–23)
CHLORIDE: 98 meq/L (ref 96–112)
CO2: 14 meq/L — AB (ref 19–32)
Calcium: 9.4 mg/dL (ref 8.4–10.5)
Creatinine, Ser: 4.45 mg/dL — ABNORMAL HIGH (ref 0.50–1.10)
GFR calc Af Amer: 12 mL/min — ABNORMAL LOW (ref >90)
GFR, EST NON AFRICAN AMERICAN: 10 mL/min — AB (ref >90)
Glucose, Bld: 105 mg/dL — ABNORMAL HIGH (ref 70–99)
Potassium: 5.5 mEq/L — ABNORMAL HIGH (ref 3.7–5.3)
SODIUM: 132 meq/L — AB (ref 137–147)
Total Protein: 7.1 g/dL (ref 6.0–8.3)

## 2014-10-10 LAB — URINALYSIS, ROUTINE W REFLEX MICROSCOPIC
Bilirubin Urine: NEGATIVE
GLUCOSE, UA: NEGATIVE mg/dL
Ketones, ur: NEGATIVE mg/dL
LEUKOCYTES UA: NEGATIVE
Nitrite: NEGATIVE
Protein, ur: 100 mg/dL — AB
Specific Gravity, Urine: 1.018 (ref 1.005–1.030)
Urobilinogen, UA: 0.2 mg/dL (ref 0.0–1.0)
pH: 5 (ref 5.0–8.0)

## 2014-10-10 LAB — CBC WITH DIFFERENTIAL/PLATELET
BASOS ABS: 0 10*3/uL (ref 0.0–0.1)
Basophils Relative: 0 % (ref 0–1)
EOS PCT: 0 % (ref 0–5)
Eosinophils Absolute: 0 10*3/uL (ref 0.0–0.7)
HEMATOCRIT: 18.9 % — AB (ref 36.0–46.0)
Hemoglobin: 6.1 g/dL — CL (ref 12.0–15.0)
Lymphocytes Relative: 3 % — ABNORMAL LOW (ref 12–46)
Lymphs Abs: 0.7 10*3/uL (ref 0.7–4.0)
MCH: 26 pg (ref 26.0–34.0)
MCHC: 32.3 g/dL (ref 30.0–36.0)
MCV: 80.4 fL (ref 78.0–100.0)
MONO ABS: 1.3 10*3/uL — AB (ref 0.1–1.0)
MONOS PCT: 6 % (ref 3–12)
Neutro Abs: 19.9 10*3/uL — ABNORMAL HIGH (ref 1.7–7.7)
Neutrophils Relative %: 91 % — ABNORMAL HIGH (ref 43–77)
Platelets: 232 10*3/uL (ref 150–400)
RBC: 2.35 MIL/uL — ABNORMAL LOW (ref 3.87–5.11)
RDW: 13.6 % (ref 11.5–15.5)
WBC: 21.9 10*3/uL — AB (ref 4.0–10.5)

## 2014-10-10 LAB — PREPARE RBC (CROSSMATCH)

## 2014-10-10 LAB — IRON AND TIBC
IRON: 42 ug/dL (ref 42–135)
SATURATION RATIOS: 32 % (ref 20–55)
TIBC: 130 ug/dL — ABNORMAL LOW (ref 250–470)
UIBC: 88 ug/dL — ABNORMAL LOW (ref 125–400)

## 2014-10-10 LAB — PROTEIN / CREATININE RATIO, URINE
Creatinine, Urine: 114.51 mg/dL
Protein Creatinine Ratio: 1.88 — ABNORMAL HIGH (ref 0.00–0.15)
TOTAL PROTEIN, URINE: 215.8 mg/dL

## 2014-10-10 LAB — URINE MICROSCOPIC-ADD ON

## 2014-10-10 LAB — CBC
HCT: 18.3 % — ABNORMAL LOW (ref 36.0–46.0)
Hemoglobin: 6.2 g/dL — CL (ref 12.0–15.0)
MCH: 27.8 pg (ref 26.0–34.0)
MCHC: 33.9 g/dL (ref 30.0–36.0)
MCV: 82.1 fL (ref 78.0–100.0)
PLATELETS: 218 10*3/uL (ref 150–400)
RBC: 2.23 MIL/uL — ABNORMAL LOW (ref 3.87–5.11)
RDW: 13.6 % (ref 11.5–15.5)
WBC: 20.1 10*3/uL — ABNORMAL HIGH (ref 4.0–10.5)

## 2014-10-10 LAB — MRSA PCR SCREENING: MRSA by PCR: NEGATIVE

## 2014-10-10 LAB — SODIUM, URINE, RANDOM

## 2014-10-10 LAB — VITAMIN B12: Vitamin B-12: 825 pg/mL (ref 211–911)

## 2014-10-10 LAB — SEDIMENTATION RATE

## 2014-10-10 LAB — LACTIC ACID, PLASMA: Lactic Acid, Venous: 0.5 mmol/L (ref 0.5–2.2)

## 2014-10-10 LAB — URIC ACID: URIC ACID, SERUM: 8.3 mg/dL — AB (ref 2.4–7.0)

## 2014-10-10 LAB — CREATININE, URINE, RANDOM: CREATININE, URINE: 114.32 mg/dL

## 2014-10-10 LAB — CK: CK TOTAL: 244 U/L — AB (ref 7–177)

## 2014-10-10 MED ORDER — SODIUM CHLORIDE 0.9 % IV SOLN
INTRAVENOUS | Status: DC
Start: 2014-10-10 — End: 2014-10-10

## 2014-10-10 MED ORDER — CYCLOBENZAPRINE HCL 5 MG PO TABS: 5.0000 mg | ORAL_TABLET | Freq: Three times a day (TID) | ORAL | Status: DC | PRN

## 2014-10-10 MED ORDER — ONDANSETRON HCL 4 MG/2ML IJ SOLN: 4.0000 mg | Freq: Four times a day (QID) | INTRAMUSCULAR | Status: DC | PRN

## 2014-10-10 MED ORDER — SODIUM CHLORIDE 0.9 % IV SOLN: Freq: Once | INTRAVENOUS | Status: DC

## 2014-10-10 MED ORDER — ONDANSETRON HCL 4 MG PO TABS: 4.0000 mg | ORAL_TABLET | Freq: Four times a day (QID) | ORAL | Status: DC | PRN

## 2014-10-10 MED ORDER — ACETAMINOPHEN 650 MG RE SUPP: 650.0000 mg | Freq: Four times a day (QID) | RECTAL | Status: DC | PRN

## 2014-10-10 MED ORDER — SODIUM BICARBONATE 8.4 % IV SOLN
INTRAVENOUS | Status: DC
  Administered 2014-10-10: 21:00:00 via INTRAVENOUS
  Filled 2014-10-10 (×3): qty 1000

## 2014-10-10 MED ORDER — VITAMIN D (ERGOCALCIFEROL) 1.25 MG (50000 UNIT) PO CAPS
50000.0000 [IU] | ORAL_CAPSULE | ORAL | Status: DC
  Filled 2014-10-10: qty 1

## 2014-10-10 MED ORDER — VITAMIN D3 1.25 MG (50000 UT) PO CAPS: 1.0000 | ORAL_CAPSULE | ORAL | Status: DC

## 2014-10-10 MED ORDER — MORPHINE SULFATE 4 MG/ML IJ SOLN
4.0000 mg | Freq: Once | INTRAMUSCULAR | Status: AC
  Administered 2014-10-10: 4 mg via INTRAVENOUS
  Filled 2014-10-10: qty 1

## 2014-10-10 MED ORDER — SODIUM CHLORIDE 0.9 % IJ SOLN
3.0000 mL | Freq: Two times a day (BID) | INTRAMUSCULAR | Status: DC
  Administered 2014-10-10 – 2014-10-15 (×8): 3 mL via INTRAVENOUS

## 2014-10-10 MED ORDER — ACETAMINOPHEN 325 MG PO TABS
650.0000 mg | ORAL_TABLET | Freq: Four times a day (QID) | ORAL | Status: DC | PRN
  Administered 2014-10-11 – 2014-10-13 (×2): 650 mg via ORAL
  Filled 2014-10-10 (×2): qty 2

## 2014-10-10 MED ORDER — PANTOPRAZOLE SODIUM 40 MG IV SOLR
40.0000 mg | Freq: Two times a day (BID) | INTRAVENOUS | Status: DC
  Administered 2014-10-10: 40 mg via INTRAVENOUS
  Filled 2014-10-10 (×3): qty 40

## 2014-10-10 MED ORDER — VANCOMYCIN HCL IN DEXTROSE 1-5 GM/200ML-% IV SOLN
1000.0000 mg | INTRAVENOUS | Status: DC
  Administered 2014-10-10: 1000 mg via INTRAVENOUS
  Filled 2014-10-10: qty 200

## 2014-10-10 MED ORDER — AMLODIPINE BESYLATE 10 MG PO TABS
10.0000 mg | ORAL_TABLET | Freq: Every day | ORAL | Status: DC
  Administered 2014-10-10 – 2014-10-17 (×8): 10 mg via ORAL
  Filled 2014-10-10 (×9): qty 1

## 2014-10-10 MED ORDER — SODIUM CHLORIDE 0.9 % IV SOLN
INTRAVENOUS | Status: DC
  Administered 2014-10-10: 09:00:00 via INTRAVENOUS

## 2014-10-10 MED ORDER — PIPERACILLIN-TAZOBACTAM IN DEX 2-0.25 GM/50ML IV SOLN
2.2500 g | Freq: Three times a day (TID) | INTRAVENOUS | Status: DC
  Administered 2014-10-10 – 2014-10-11 (×3): 2.25 g via INTRAVENOUS
  Filled 2014-10-10 (×5): qty 50

## 2014-10-10 MED ORDER — CETYLPYRIDINIUM CHLORIDE 0.05 % MT LIQD
7.0000 mL | Freq: Two times a day (BID) | OROMUCOSAL | Status: DC
  Administered 2014-10-10 – 2014-10-17 (×14): 7 mL via OROMUCOSAL

## 2014-10-10 MED ORDER — FERROUS SULFATE 325 (65 FE) MG PO TABS
325.0000 mg | ORAL_TABLET | Freq: Every day | ORAL | Status: DC
  Administered 2014-10-11 – 2014-10-13 (×3): 325 mg via ORAL
  Filled 2014-10-10 (×5): qty 1

## 2014-10-10 MED ORDER — MORPHINE SULFATE 4 MG/ML IJ SOLN: 4.0000 mg | Freq: Once | INTRAMUSCULAR | Status: DC

## 2014-10-10 MED ORDER — MYCOPHENOLATE MOFETIL 250 MG PO CAPS
1000.0000 mg | ORAL_CAPSULE | Freq: Two times a day (BID) | ORAL | Status: DC
  Administered 2014-10-10 – 2014-10-12 (×4): 1000 mg via ORAL
  Filled 2014-10-10 (×6): qty 4

## 2014-10-10 MED ORDER — NICOTINE 14 MG/24HR TD PT24
14.0000 mg | MEDICATED_PATCH | TRANSDERMAL | Status: DC
  Administered 2014-10-10 – 2014-10-17 (×8): 14 mg via TRANSDERMAL
  Filled 2014-10-10 (×9): qty 1

## 2014-10-10 MED ORDER — RENA-VITE PO TABS
1.0000 | ORAL_TABLET | Freq: Every day | ORAL | Status: DC
  Administered 2014-10-10 – 2014-10-17 (×8): 1 via ORAL
  Filled 2014-10-10 (×8): qty 1

## 2014-10-10 NOTE — Progress Notes (Signed)
Orthopedic Tech Progress Note Patient Details:  [name] [name] [birth year] [id number]  Patient ID: [name], female   DOB: Apr 16, 1961, 53 y.o.   MRN: [id number] Called in advanced brace order; spoke with Jane Canary, Caileigh Canche 10/10/2014, 9:19 AM

## 2014-10-10 NOTE — Progress Notes (Signed)
10/10/2014 Patient transfer from rehab to 2 central at 1245. She is alert, oriented and very weak. Patient c/o pain arms, and legs. Patient was sleeping while on unit, but was able to wake patient. Patient have bruise on hand, swelling on both hands. She does have a heal area on sacrum.  Patient was place on telemetry and bed alarm. Scnetx RN.

## 2014-10-10 NOTE — H&P (Addendum)
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 [name]), rheumatoid arthritis, chronic right torticollis, who was hospitalized recently with a right paramedian pontine infarct and monitored in the ICU. Patient had residual upper extremity weakness. Stroke workup done showed nonsignificant carotid stenosis and 2-D echo with severe LVH. During hospitalization she also double up mild pulmonary edema and improved with diuresis. I'll up head CT and MRI brain showed improvement in the size of hemorrhage. Patient was discharged to inpatient rehabilitation for ongoing physical therapy on 12/3. Reportedly following admission there patient has been more sleepy . Patient reports she has not been participating in rehabilitation much due to worsening pain in her knees and ankles for past few days. Blood work done at the rehabilitation showing acute leukocytosis and progressive drop in H&H (11>9>8>6.1 today). She has history of iron deficiency anemia 19 supplements. Stool for occult blood done today was negative. Chemistry done showing anion gap metabolic acidosis with hyperkalemia and worsening renal function. Reportedly patient has poor by mouth intake. Also noted for low-grade temperature this morning. Patient has been reporting pain in her neck, worsened wrists, elbows, bilateral knee and ankle pain. Patient is not able to provide a good detailed history as she is sleepy but is easily arousable and oriented on questioning. This morning she continued to be sleepy and blood work showed abnormal results as outlined above. Hospitalist was consulted for evaluation and admitted to stepdown  with concern for sepsis and further workup. Patient denies headache, dizziness, fever, chills, nausea , vomiting, chest pain, palpitations, SOB, abdominal pain, bowel or urinary symptoms. Denies hematemesis or melena.  Review of Systems:  As outlined in history of present illness. Detailed review of systems Limited due to patient's increased sleepiness and not engaging quickly in conversation.   Past Medical History  Diagnosis Date  . Hypertension   . Lupus   . CKD (chronic kidney disease)     due to lupus/Dr. [name]  . Diabetes mellitus   . Stenosis of cervical spine region     with HNP at C5/6, C6/7  . Metatarsal bone fracture right    4th  . 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   Past Surgical History  Procedure Laterality Date  . Breast biopsy     Social History:  reports that she has been smoking Cigarettes.  She has a 15 pack-year smoking history. She does not have any smokeless tobacco history on file. She reports that she does not drink alcohol or use illicit drugs.  Allergies  Allergen Reactions  . Sulfa Antibiotics Itching    Family History  Problem Relation Age of Onset  . Hypertension    . Lupus    . Rheum arthritis    . Hypertension Mother   . Diabetes Mother   . Hypertension Sister   . Diabetes Father   . Hypertension Maternal Grandmother     Prior to Admission medications   Medication Sig Start Date End Date Taking? Authorizing Provider  amLODipine (NORVASC) 10 MG tablet take 1 tablet by mouth once daily 12/04/11  Yes Dayna N Dunn, PA-C  Cholecalciferol (VITAMIN D3) 50000 UNITS CAPS Take 1 capsule by mouth 3 (three) times a  week. 08/13/12  Yes Robyn Haber, MD  ferrous sulfate 325 (65 FE) MG tablet Take 325 mg by mouth daily with breakfast.   Yes Historical Provider, MD  multivitamin (RENA-VIT) TABS tablet Take 1 tablet by mouth daily.   Yes Historical Provider, MD  mycophenolate (CELLCEPT) 250 MG capsule Take 1,000 mg by mouth 2 (two) times daily.   Yes  Historical Provider, MD  nicotine (NICODERM CQ - DOSED IN MG/24 HOURS) 14 mg/24hr patch Place 1 patch onto the skin daily.   Yes Historical Provider, MD  cyclobenzaprine (FLEXERIL) 5 MG tablet Take 1 tablet (5 mg total) by mouth 3 (three) times daily as needed for muscle spasms. 10/10/14   Bary Leriche, PA-C     Physical Exam:  Filed Vitals:   10/10/14 1049 10/10/14 1231  BP: 116/52 133/59  Temp: 97.7 F (36.5 C) 98.1 F (36.7 C)  TempSrc: Oral Axillary  Height: 5\' 1"  (1.549 m)   Weight: 66.679 kg (147 lb)     Constitutional: Vital signs reviewed.  Middle aged female lying in bed appears sleepy but not in distress HEENT: no pallor, no icterus, moist oral mucosa, no cervical lymphadenopathy Cardiovascular: RRR, S1 normal, S2 normal, no MRG Chest: CTAB, no wheezes, rales, or rhonchi Abdominal: Soft. Non-tender, non-distended, bowel sounds are normal,  GU: no CVA tenderness Ext: warm, minimal swelling over bilateral knees and ankles with tenderness to pressure, swollen hands with tender to pressure over bilateral wrists and elbows. swelling or tenderness  Neurological: Sleepy but arousable and oriented. Cranial lungs 2-12 intact, 3+/5 power over RUE, patient unable to lift bilateral leg c/o pain in her ankles and knees, answers downgoing bilaterally, 4/5 power over her left upper extremity. Normal sensations. Cerebellar function and gait not checked  Labs on Admission:  Basic Metabolic Panel:  Recent Labs Lab 10/04/14 0258 10/05/14 0521 10/06/14 0611 10/10/14 0726  NA 143 139 139 132*  K 4.5 4.0 4.3 5.5*  CL 111 108 108 98  CO2 11* 15* 15* 14*  GLUCOSE 75 114* 88 105*  BUN 46* 50* 49* 74*  CREATININE 3.21* 2.86* 3.04* 4.45*  CALCIUM 9.1 8.9 9.1 9.4  MG 1.8  --   --   --    Liver Function Tests:  Recent Labs Lab 10/05/14 0521 10/06/14 0611 10/10/14 0726  AST 12 11 31   ALT 7 7 23   ALKPHOS 83 85 111  BILITOT 0.3 0.3 0.6  PROT 6.6 6.8 7.1  ALBUMIN 1.9* 1.8* 1.8*    No results for input(s): LIPASE, AMYLASE in the last 168 hours. No results for input(s): AMMONIA in the last 168 hours. CBC:  Recent Labs Lab 10/04/14 0258 10/05/14 0521 10/06/14 0611 10/07/14 1118 10/10/14 0439 10/10/14 0726  WBC 13.0* 8.8 8.1  --  20.1* 21.9*  NEUTROABS  --  7.3 6.6  --   --  19.9*  HGB 8.5* 7.7* 8.2* 8.1* 6.2* 6.1*  HCT 26.0* 23.8* 25.0* 23.9* 18.3* 18.9*  MCV 80.5 80.4 83.3  --  82.1 80.4  PLT 205 213 216  --  218 232   Cardiac Enzymes: No results for input(s): CKTOTAL, CKMB, CKMBINDEX, TROPONINI in the last 168 hours. BNP: Invalid input(s): POCBNP CBG:  Recent Labs Lab 10/05/14 1059  GLUCAP 146*    Radiological Exams on Admission: Dg Chest Port 1 View  10/10/2014   CLINICAL DATA:  Sepsis, hypertension, shortness of Breath.  EXAM: PORTABLE CHEST - 1 VIEW  COMPARISON:  10/05/2014  FINDINGS: Stable moderate cardiomegaly. Slightly  improved aeration with some residual subsegmental atelectasis in the lung bases. No definite effusion Visualized skeletal structures are unremarkable.  IMPRESSION: 1. Stable cardiomegaly. 2. Improved aeration with minimal residual bibasilar subsegmental atelectasis.   Electronically Signed   By: Arne Cleveland M.D.   On: 10/10/2014 12:09    EKG: Normal Sinus rhythm, no ST-T changes (independently reviewed)  Assessment/Plan Principal Problem:   Sepsis Admit to stepdown for close monitoring. HR>100, and significant leucocytosis with encephalopathy meeting criteria for sepsis. Check blood culture, UA and urine culture, chest x-ray. Placed on empiric vancomycin and Zosyn. -Continue neuro checks every 2 hours. -Lactic acid normal. -No clear source of sepsis at this time. Patient has progressive diffuse polyarthritis. Cannot  Rule out septic arthritis. Will check bilateral knee x-rays. Have ask Dr. Marcelino Scot to evaluate need for diagnostic arthrocentesis.  Active Problems: Acute anemia Patient has progressive drop in her H&H  from 11.7 I'll in the hospital to 6.1 today. Stool for occult blood checked this morning is negative. She has underlying iron deficiency anemia. No signs of GI bleed noted. Would place on IV PPI twice a day. -Avoid NSAIDs. Will transfuse with 2 units PRBC. Monitor serial H&H. [facility] consulted for evaluation.   Right pontine hemorrhage with acute encephalopathy and ? New RUE weakness Recent hospitalization for the same. Now has acute encephalopathy and right upper extremity weakness which seems new. Patient had residual left upper extremity weakness during recent hospitalization.. Will obtain follow-up MRI brain and EEG. -Continue neuro checks. PT/OT eval. Not on antiplatelet therapy. Continue statin. Consulted neurology to evaluate. -disontinue Flexeril and avoid narcotics or benzodiazepines.     Acute renal failure superimposed on stage 4 chronic kidney disease Patient has acute worsening of her underlying chronic kidney disease. (Baseline creatinine appears to be around 3.2-3.5.). Likely related to lupus nephritis. She has underlying metabolic acidosis and hypokalemia. On reviewing recent discharge summary patient was on Lasix and ACE inhibitor which is now discontinued (possibly for the last few days). Check UA, urine lites, uric acid and renal ultrasound. Avoid NSAIDs. Patient is also on CellCept for her underlying lupus. Check level. -Monitor I/O closely and daily electrolytes and renal function. -We'll place on D5 with 2 amps of IV bicarbonate. This would likely correct the hyperkalemia.  -Place Foley catheter for closer urine output monitoring.    Hyperkalemia No EKG changes. Placed on D5 with IV bicarbonate which would likely correct her hypokalemia. Will give her 15 mg of Kayexalate.  Rheumatoid arthritis Followed with Dr. [name] as outpt until one year ago. On CellCept. Check levels.  Acute polyarthritis As outlined above. Acute septic arthritis versus acute polyarticular gout  versus worsening of rheumatoid arthritis. Check x-rays of the knees. Check uric acid. Orthopedics consulted.     Essential hypertension Blood pressure stable. Continue amlodipine.     Diet: npo until cleared for bedside swallow.  DVT prophylaxis: SCD   Code Status: Full code Family Communication: Discussed with son and daughter at bedside Disposition Plan: Continue step down monitoring  Louellen Molder [facility] Pager [phone]  Total time spent on admission :70 minutes  If 7PM-7AM, please contact night-coverage [email or web address] Password [facility] 10/10/2014, 12:33 PM

## 2014-10-10 NOTE — Progress Notes (Signed)
ANTIBIOTIC CONSULT NOTE - INITIAL  Pharmacy Consult for Vancomycin / Zosyn Indication: sepsis  Allergies  Allergen Reactions  . Sulfa Antibiotics Itching    Patient Measurements: Height: 5\' 1"  (154.9 cm) Weight: 147 lb (66.679 kg) IBW/kg (Calculated) : 47.8  Labs:  Recent Labs  10/10/14 0439 10/10/14 0726  WBC 20.1* 21.9*  HGB 6.2* 6.1*  PLT 218 232  CREATININE  --  4.45*   Estimated Creatinine Clearance: 12.9 mL/min (by C-G formula based on Cr of 4.45). No results for input(s): VANCOTROUGH, VANCOPEAK, VANCORANDOM, GENTTROUGH, GENTPEAK, GENTRANDOM, TOBRATROUGH, TOBRAPEAK, TOBRARND, AMIKACINPEAK, AMIKACINTROU, AMIKACIN in the last 72 hours.   Microbiology: Recent Results (from the past 720 hour(s))  MRSA PCR Screening     Status: None   Collection Time: 09/28/14 11:31 PM  Result Value Ref Range Status   MRSA by PCR NEGATIVE NEGATIVE Final    Comment:        The GeneXpert MRSA Assay (FDA approved for NASAL specimens only), is one component of a comprehensive MRSA colonization surveillance program. It is not intended to diagnose MRSA infection nor to guide or monitor treatment for MRSA infections.   Urine culture     Status: None   Collection Time: 10/05/14  7:03 PM  Result Value Ref Range Status   Specimen Description URINE, CLEAN CATCH  Final   Special Requests NONE  Final   Culture  Setup Time   Final    10/05/2014 23:02 Performed at [facility]   Final    30,000 COLONIES/ML Performed at [facility]    Culture   Final    Multiple bacterial morphotypes present, none predominant. Suggest appropriate recollection if clinically indicated. Performed at [facility]    Report Status 10/06/2014 FINAL  Final    Medical History: Past Medical History  Diagnosis Date  . Hypertension   . Lupus   . CKD (chronic kidney disease)     due to lupus/Dr. [name]  . Diabetes mellitus   . Stenosis of cervical spine region    with HNP at C5/6, C6/7  . Metatarsal bone fracture right    4th  . 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    Assessment: 53 year old female originally admitted 09/28/14 with a hemorrhagic CVA.  She has a history of HTN, hyperlipidemia, lupus, and CKD.   She transferred to rehab 12/3, but is now transferred back to 2 C with a low HgB and elevated WBC.  Pharmacy asked to begin vancomycin and Zosyn for rule out sepsis.  Goal of Therapy:  Vancomycin trough level 15-20 mcg/ml  Appropriate Zosyn dosing  Plan:  Zosyn 2.25 grams iv Q 8 hours Vancomycin 1 gram iv Q 48 hours Follow up Scr, cultures, progress  Thank you. Anette Guarneri, PharmD [phone]  10/10/2014,11:25 AM

## 2014-10-10 NOTE — Progress Notes (Signed)
EEG completed; results pending.    

## 2014-10-10 NOTE — Progress Notes (Signed)
[facility]     PROGRESS NOTE    Subjective/Complaints: Bloodwork reviewed, c/o joint pain in hands and knees.  No BRBPR, no hematemesis Denies fever chills.  Objective: Vital Signs: Blood pressure 114/52, pulse 96, temperature 99.7 F (37.6 C), temperature source Oral, resp. rate 18, height 5' 5.5" (1.664 m), weight 66.5 kg (146 lb 9.7 oz), last menstrual period 07/18/2012, SpO2 97 %. No results found.  Recent Labs  10/07/14 1118 10/10/14 0439  WBC  --  20.1*  HGB 8.1* 6.2*  HCT 23.9* 18.3*  PLT  --  218   No results for input(s): NA, K, CL, GLUCOSE, BUN, CREATININE, CALCIUM in the last 72 hours.  Invalid input(s): CO CBG (last 3)  No results for input(s): GLUCAP in the last 72 hours.  Wt Readings from Last 3 Encounters:  10/05/14 66.5 kg (146 lb 9.7 oz)  09/28/14 64.6 kg (142 lb 6.7 oz)  09/04/12 61.236 kg (135 lb)    Physical Exam:  Constitutional: She is oriented to person, place, and time. She appears well-developed and well-nourished.   HENT: dentition fair Head: Normocephalic and atraumatic.  Blistered areas on tip of tongue--non tender  Eyes: Conjunctivae are normal. Pupils are equal, round, and reactive to light.  Neck: Muscular tenderness present. Decreased range of motion present. right torticollis Cardiovascular: Regular rhythm. Tachycardia present. systolic ej Murmur 3/6 Respiratory: Effort normal. She has decreased breath sounds in the right lower field and the left lower field.  GI: Soft. Bowel sounds are normal.  Musculoskeletal: She exhibits no edema or tenderness. Pain to palp B MCP, PIP and knees Neurological: She is oriented to person, place, and time.  . Able to state day, month as well as medical questions. Able to follow basic commands without difficulty. Inconsistent RUE weakness -- joint painpoor effort. LLE weakness with dysesthesias. Sensation 1/2 LUE and LLE with PP and LT. Left PD. intermittent  tone LUE. Has poor awareness and insight into deficits.  Skin: Skin is warm and dry.  Psychiatric: Her speech is normal. Her affect is blunt.  Assessment/Plan: 1. Functional deficits secondary to right paramedian pontine infarct which require 3+ hours per day of interdisciplinary therapy in a comprehensive inpatient rehab setting. Physiatrist is providing close team supervision and 24 hour management of active medical problems listed below. Physiatrist and rehab team continue to assess barriers to discharge/monitor patient progress toward functional and medical goals. FIM: FIM - Bathing Bathing Steps Patient Completed: Chest, Abdomen, Front perineal area, Right upper leg, Left upper leg Bathing: 3: Mod-Patient completes 5-7 13f 10 parts or 50-74%  FIM - Upper Body Dressing/Undressing Upper body dressing/undressing steps patient completed: Thread/unthread right bra strap, Thread/unthread left bra strap, Thread/unthread right sleeve of pullover shirt/dresss, Thread/unthread left sleeve of pullover shirt/dress Upper body dressing/undressing: 1: Total-Patient completed less than 25% of tasks FIM - Lower Body Dressing/Undressing Lower body dressing/undressing steps patient completed: Thread/unthread right underwear leg, Thread/unthread right pants leg Lower body dressing/undressing: 2: Max-Patient completed 25-49% of tasks  FIM - Toileting Toileting steps completed by patient: Performs perineal hygiene Toileting: 2: Max-Patient completed 1 of 3 steps  FIM - Radio producer Devices: Grab bars Toilet Transfers: 2-To toilet/BSC: Max A (lift and lower assist), 2-From toilet/BSC: Max A (lift and lower assist)  FIM - Control and instrumentation engineer Devices: Arm rests Bed/Chair Transfer: 2: Bed > Chair or W/C: Max A (lift and lower assist), 2: Chair or W/C > Bed: Max A (lift  and lower assist), 2: Supine > Sit: Max A (lifting assist/Pt. 25-49%), 3: Sit >  Supine: Mod A (lifting assist/Pt. 50-74%/lift 2 legs)  FIM - Locomotion: Wheelchair Distance: 50 Locomotion: Wheelchair: 2: Travels 50 - 149 ft with moderate assistance (Pt: 50 - 74%) FIM - Locomotion: Ambulation Locomotion: Ambulation Assistive Devices: Administrator Ambulation/Gait Assistance: 3: Mod assist Locomotion: Ambulation: 1: Travels less than 50 ft with moderate assistance (Pt: 50 - 74%)  Comprehension Comprehension Mode: Auditory Comprehension: 5-Understands basic 90% of the time/requires cueing < 10% of the time  Expression Expression Mode: Verbal Expression: 4-Expresses basic 75 - 89% of the time/requires cueing 10 - 24% of the time. Needs helper to occlude trach/needs to repeat words.  Social Interaction Social Interaction: 4-Interacts appropriately 75 - 89% of the time - Needs redirection for appropriate language or to initiate interaction.  Problem Solving Problem Solving: 4-Solves basic 75 - 89% of the time/requires cueing 10 - 24% of the time  Memory Memory: 4-Recognizes or recalls 75 - 89% of the time/requires cueing 10 - 24% of the time  Medical Problem List and Plan: 1. 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 DVT Prophylaxis/Anticoagulation: Mechanical: Sequential compression devices, below knee Bilateral lower extremities 3. Chronic pain/Pain Management: Alternate heat with ice to neck in addition to oxycodone prn. Therapy to work on ROM for neck. Keep pillow on head/neck to support and stretch  -added low dose zanaflex as well---watch for sedation 4. Mood: More alert today. LCSW to follow for evaluation and support.  5. Neuropsych: This patient is capable of making decisions on his own behalf. 6. Skin/Wound Care: Routine pressure relief measures.  7. Fluids/Electrolytes/Nutrition: Monitor I/O. Will add supplements to help with hydration and intake. Check lyes in am.  8. HTN:  Monitor qid with SBP goal below 180. Continue lasix, Normodyne and Norvasc.   -bp's improving 9. Lupus: Has not seen rheum (Dr. Mearl Latin) for the past year and question compliance with medications. Continue Cellcept bid.  10. Acute on chronic CKD: Baseline BUN/Cr- 38/3.20 at admission. Encourage po fluid intake.  11. ABLA: Has had drop in Hgb 11.6--> 9.5-->8.5-->7.7. Continue iron supplement.   -hgb at 8.1 over weekend---recheck 6.2 but will repeat to make sure, no obvious signs of blood loss  -stool guaiac negative 12. Dyslipidemia: On Lipitor daily.  13. Medical non-compliance: Counsel/Educate patient on importance of adhering to medication regimen.  14. Leucocytosis: ? RA flare vs infectious  Will consult IM     LOS (Days) 5 A FACE TO FACE EVALUATION WAS PERFORMED  Kiree Dejarnette E 10/10/2014 6:50 AM

## 2014-10-10 NOTE — Procedures (Signed)
EEG report.  Brief clinical history:  53 y/o with right pontine hemorrhage with acute encephalopathy and ? New RUE weakness.    Technique: this is a 17 channel routine scalp EEG performed at the bedside with bipolar and monopolar montages arranged in accordance to the international 10/20 system of electrode placement. One channel was dedicated to EKG recording.  The study was performed during wakefulness and drowsiness. No activating procedures performed.  Description:In the wakeful state, the best background consisted of a medium amplitude, posterior dominant, well sustained, symmetric and reactive 9 Hz rhythm. Drowsiness demonstrated dropout of the alpha rhythm. No focal or generalized epileptiform discharges noted.  No pathologic areas of slowing seen.  EKG showed sinus rhythm.  Impression: this is a normal awake and drowsy EEG. Please, be aware that a normal EEG does not exclude the possibility of epilepsy.  Clinical correlation is advised.  Dorian Pod, MD

## 2014-10-10 NOTE — Discharge Summary (Signed)
Physician Discharge Summary  Patient ID: [name] [name] MRN: [id number] DOB/AGE: July 28, 1961 53 y.o.  Admit date: 10/05/2014 Discharge date: 10/10/2014  Discharge Diagnoses:  Principal Problem:   SIRS (systemic inflammatory response syndrome) Active Problems:   Systemic lupus erythematosus   Pontine hemorrhage   Malignant hypertension   Torticollis, acquired   Sepsis   Leucocytosis   Chronic kidney disease   Acute renal failure superimposed on stage 4 chronic kidney disease   Anemia   Hyperkalemia   Discharged Condition: Guarded  Significant Diagnostic Studies: Dg Chest Port 1 View  10/05/2014   CLINICAL DATA:  Acute respiratory acidosis.  EXAM: PORTABLE CHEST - 1 VIEW  COMPARISON:  10/04/2014  FINDINGS: No residual pulmonary edema. Probable small effusions with mild basilar atelectasis. Cardiac silhouette is mildly enlarged. Normal mediastinal and hilar contours. No pneumothorax.  IMPRESSION: Resolved pulmonary edema. Probable small residual pleural effusions with mild basilar atelectasis.   Electronically Signed   By: Lajean Manes M.D.   On: 10/05/2014 08:00   Dg Chest Port 1 View  10/04/2014   CLINICAL DATA:  Intracranial hemorrhage.  EXAM: PORTABLE CHEST - 1 VIEW  COMPARISON:  10/03/2014.  FINDINGS: Mediastinum and hilar structures are normal. Cardiomegaly with mild pulmonary vascular prominence. Interim partial clearing of bilateral pulmonary edema. Small bilateral pleural effusions. No pneumothorax. No acute osseous abnormality .  IMPRESSION: Interim partial clearing of bilateral pulmonary edema.   Electronically Signed   By: Marcello Moores  Register   On: 10/04/2014 07:22   Dg Chest Port 1 View  10/03/2014   CLINICAL DATA:  Shortness of breath this morning.  EXAM: PORTABLE CHEST - 1 VIEW  COMPARISON:  09/29/2014  FINDINGS: The cardiac silhouette appears mildly enlarged. There is new pulmonary vascular congestion with interstitial and airspace opacities in the mid and lower lungs  bilaterally. Both hemidiaphragms are obscured. Small bilateral pleural effusions are suspected. No pneumothorax is identified. No acute osseous abnormality is identified.  IMPRESSION: New pulmonary vascular congestion and bibasilar opacities consistent with pulmonary edema. Possible small bilateral pleural effusions.   Electronically Signed   By: Logan Bores   On: 10/03/2014 14:04    Labs:  Basic Metabolic Panel:  Recent Labs Lab 10/04/14 0258 10/05/14 0521 10/06/14 0611 10/10/14 0726  NA 143 139 139 132*  K 4.5 4.0 4.3 5.5*  CL 111 108 108 98  CO2 11* 15* 15* 14*  GLUCOSE 75 114* 88 105*  BUN 46* 50* 49* 74*  CREATININE 3.21* 2.86* 3.04* 4.45*  CALCIUM 9.1 8.9 9.1 9.4  MG 1.8  --   --   --     CBC:  Recent Labs Lab 10/05/14 0521 10/06/14 0611 10/07/14 1118 10/10/14 0439 10/10/14 0726  WBC 8.8 8.1  --  20.1* 21.9*  NEUTROABS 7.3 6.6  --   --  19.9*  HGB 7.7* 8.2* 8.1* 6.2* 6.1*  HCT 23.8* 25.0* 23.9* 18.3* 18.9*  MCV 80.4 83.3  --  82.1 80.4  PLT 213 216  --  218 232    CBG:  Recent Labs Lab 10/05/14 1059  GLUCAP 146*    Brief HPI:   [name] [name] is a 53 y.o. female with history of HTN, hyperlipidemia, lupus with mild LLE weakness as well as compliance issues who was admitted on 09/28/14 with acute onset left sided weakness and numbness as well as gaze to the right side. Patient reported being out of BP meds for 3 weeks and was BP 238/88 at admission. UDS negative. 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  Follow up MRI/MRA brain revealed changes c/w acute/subacute hemorrhage in right parmedian pons and no underlying mass evident. Minimal diffusion abnormality in high frontal lobes bilaterally likely due to calcification and probably meningioma.  She has worsening of respiratory status on 12/01 with difficulty handling secretions and  complaints of severe throat pain and was found to be in fluid overload requiring IV diuresis with improvement in respiratory status.  Lisinopril was dicontinued due to of renal status and she was treated with IV bicarb for acidosis. Repeat cardiac echo showed EF 60-70% with AV sclerosis without stenosis. FEES was done  showing mild dysphagia with pentration due to large boluses but no aspiration therefore she was started on regular textures with thin liquids. Patient with resultant left sided weakness impacting mobility as well as safety, left neck and shoulder pain as well as fluctuating bouts of lethargy. CIR was recommended by rehab team and MD.    Hospital Course: [name] was admitted to rehab 10/05/2014 for inpatient therapies to consist of PT, ST and OT at least three hours five days a week. Past admission physiatrist, therapy team and rehab RN have worked together to provide customized collaborative inpatient rehab. Blood pressures have been controlled and respiratory status has been stable. Follow up CXR shows that pulmonary edema has resolved. Follow up check of lytes shows minimal improvement in renal status.  CBC done revealed some improvement in H/H to 8.2/25.0. She was noted to have torticollis with neck fixed to the right.  Local therapy with heat alternating with ice as well as Sportscreme was added to help with symptoms. Oxycodone was used additionally on prn basis to help with pain  Management as well as tolerance of therapy. Range of motion of neck as well as pain has greatly improved but she has had complaints of bilateral forefoot pain with ambulation. Therefore Voltaren gel was adde to bilateral feet to help with acute on chronic pain.    She continues to have Left hemiparesis with ongoing bouts of lethargy and fatigue. Full supervision was added at meals to monitor for safety with meals. Po intake has been variable and nutritional supplements were offered to augment intake.  Therapy was ongoing and patient required moderate to max assist with ADL tasks and max assist with mobility. On early am of 12/8, patient was noted to be febrile with T-101. Am labs done revealed leucocytosis with WBC 21.9 as well as worsening of renal status. [facility] hospitalist were contacted for assistance with management of sepsis as well as drop in Hgb to 6.1 despite heme negative stools. Dr. Clementeen Graham that patient was septic with SIRS and recommended transferring patient to step down for work up and closer monitoring. Patient was discharged to acute services on 10/10/14   Disposition: ICU  Diet: Cardiac diet. Full supervision with meals.      Medication List    STOP taking these medications        aspirin 81 MG tablet     furosemide 20 MG tablet  Commonly known as:  LASIX     labetalol 200 MG tablet  Commonly known as:  NORMODYNE     lisinopril 20 MG tablet  Commonly known as:  PRINIVIL,ZESTRIL     reserpine 0.1 MG Tabs tablet     spironolactone 25 MG tablet  Commonly known as:  ALDACTONE  traMADol 50 MG tablet  Commonly known as:  ULTRAM      TAKE these medications        amLODipine 10 MG tablet  Commonly known as:  NORVASC  take 1 tablet by mouth once daily     cyclobenzaprine 5 MG tablet  Commonly known as:  FLEXERIL  Take 1 tablet (5 mg total) by mouth 3 (three) times daily as needed for muscle spasms.     ferrous sulfate 325 (65 FE) MG tablet  Take 325 mg by mouth daily with breakfast.     multivitamin Tabs tablet  Take 1 tablet by mouth daily.     mycophenolate 250 MG capsule  Commonly known as:  CELLCEPT  Take 1,000 mg by mouth 2 (two) times daily.     nicotine 14 mg/24hr patch  Commonly known as:  NICODERM CQ - dosed in mg/24 hours  Place 1 patch onto the skin daily.     Vitamin D3 50000 UNITS Caps  Take 1 capsule by mouth 3 (three) times a week.       Follow-up Information    Follow up with Bobby Rumpf, MD.   Specialty:  Infectious  Diseases   Contact information:   [address] [place] [address] [phone]       Signed: Bary Leriche 10/10/2014, 9:43 AM

## 2014-10-10 NOTE — Progress Notes (Signed)
Pt to be transferred to medical SD unit. Report called to nurse on [facility]. Pt's son called and notified of transfer and belongings gathered to send home with family.

## 2014-10-10 NOTE — Progress Notes (Signed)
Patient has c/o of muscle spasms in legs. Provided PRN Flexeril.  Oval Linsey, RN

## 2014-10-10 NOTE — Progress Notes (Signed)
10/10/2014 Patient had a bladder scan done today  And she had 290 cc. Orders were given by MD to place  foley cath. Foley cath was place  at 1315, she had yellow, cloudy urine. Rico Sheehan RN

## 2014-10-10 NOTE — Consult Note (Signed)
[name] [name] Admit Date: 10/10/2014 10/10/2014 [name] [name] Requesting Physician:  Dhungel  Reason for Consult:  AKI, Hyperkalemia, metabolic acidosis HPI:  89A hx/o CKD (sees ?[name] [name] at [employer], ? BL SCr high 2s), hx/o SLE on MMF (manifestations unclear) admitted late last month with small ICH and went to [facility].  There has developed fever, leukocytosis, fatigue, worsened anemia and found to have AoCKD, mild hyperkalemia, and persistent metabolic acidosis.  Was rec lasix, lisinopril, and spironolactone at [facility].  Was complaining of polyarticular arthralgias, esp hands, feet, shoulders,and knees over past 10d or so  No overt blood loss identified.  FOBT negative.    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  ANA 09/2009 positive 1:640, relatively normal C3 and C4.  Kidneys structurally normal in 2010 CT A/P.   CREATININE (mg/dL)  Date Value  10/23/2008 0.35*   CREAT (mg/dL)  Date Value  03/01/2012 2.10*   CREATININE, SER (mg/dL)  Date Value  10/10/2014 4.45*  10/06/2014 3.04*  10/05/2014 2.86*  10/04/2014 3.21*  10/02/2014 2.80*  09/30/2014 2.76*  09/28/2014 3.17*  09/28/2014 3.50*  09/28/2014 3.20*  04/28/2012 1.57*  ] ROS Balance of 12 systems is negative w/ exceptions as above  PMH  Past Medical History  Diagnosis Date  . Hypertension   . Lupus   . CKD (chronic kidney disease)     due to lupus/Dr. Justin [name]  . Diabetes mellitus   . Stenosis of cervical spine region     with HNP at C5/6, C6/7  . Metatarsal bone fracture right    4th  . 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   PSH  Past Surgical History  Procedure Laterality Date  . Breast biopsy     FH  Family History  Problem Relation Age of Onset  . Hypertension    . Lupus    . Rheum arthritis    . Hypertension Mother   . Diabetes Mother   . Hypertension Sister   . Diabetes Father   . Hypertension Maternal Grandmother    SH  reports that she has been smoking  Cigarettes.  She has a 15 pack-year smoking history. She does not have any smokeless tobacco history on file. She reports that she does not drink alcohol or use illicit drugs. Allergies  Allergies  Allergen Reactions  . Sulfa Antibiotics Itching   Home medications Prior to Admission medications   Medication Sig Start Date End Date Taking? Authorizing Provider  amLODipine (NORVASC) 10 MG tablet take 1 tablet by mouth once daily 12/04/11  Yes [name] N Dunn, PA-C  Cholecalciferol (VITAMIN D3) 50000 UNITS CAPS Take 1 capsule by mouth 3 (three) times a week. 08/13/12  Yes Robyn Haber, MD  ferrous sulfate 325 (65 FE) MG tablet Take 325 mg by mouth daily with breakfast.   Yes Historical Provider, MD  multivitamin (RENA-VIT) TABS tablet Take 1 tablet by mouth daily.   Yes Historical Provider, MD  mycophenolate (CELLCEPT) 250 MG capsule Take 1,000 mg by mouth 2 (two) times daily.   Yes Historical Provider, MD  nicotine (NICODERM CQ - DOSED IN MG/24 HOURS) 14 mg/24hr patch Place 1 patch onto the skin daily.   Yes Historical Provider, MD  cyclobenzaprine (FLEXERIL) 5 MG tablet Take 1 tablet (5 mg total) by mouth 3 (three) times daily as needed for muscle spasms. 10/10/14   Bary Leriche, PA-C    Current Medications Scheduled Meds: . sodium chloride  Intravenous Once  . amLODipine  10 mg Oral Daily  . [START ON 10/11/2014] ferrous sulfate  325 mg Oral Q breakfast  . multivitamin  1 tablet Oral Daily  . mycophenolate  1,000 mg Oral BID  . nicotine  14 mg Transdermal Q24H  . pantoprazole (PROTONIX) IV  40 mg Intravenous Q12H  . piperacillin-tazobactam (ZOSYN)  IV  2.25 g Intravenous Q8H  . sodium chloride  3 mL Intravenous Q12H  . vancomycin  1,000 mg Intravenous Q48H  . [START ON 10/11/2014] Vitamin D (Ergocalciferol)  50,000 Units Oral Q M,W,F   Continuous Infusions: . dextrose 5 % 1,000 mL with sodium bicarbonate 100 mEq infusion     PRN Meds:.acetaminophen **OR** acetaminophen, ondansetron  **OR** ondansetron (ZOFRAN) IV  CBC  Recent Labs Lab 10/05/14 0521 10/06/14 0611 10/07/14 1118 10/10/14 0439 10/10/14 0726  WBC 8.8 8.1  --  20.1* 21.9*  NEUTROABS 7.3 6.6  --   --  19.9*  HGB 7.7* 8.2* 8.1* 6.2* 6.1*  HCT 23.8* 25.0* 23.9* 18.3* 18.9*  MCV 80.4 83.3  --  82.1 80.4  PLT 213 216  --  218 170   Basic Metabolic Panel  Recent Labs Lab 10/04/14 0258 10/05/14 0521 10/06/14 0611 10/10/14 0726  NA 143 139 139 132*  K 4.5 4.0 4.3 5.5*  CL 111 108 108 98  CO2 11* 15* 15* 14*  GLUCOSE 75 114* 88 105*  BUN 46* 50* 49* 74*  CREATININE 3.21* 2.86* 3.04* 4.45*  CALCIUM 9.1 8.9 9.1 9.4    Physical Exam  Blood pressure 137/56, pulse 161, temperature 98.1 F (36.7 C), temperature source Axillary, resp. rate 15, height 5\' 1"  (1.549 m), weight 66.679 kg (147 lb), last menstrual period 07/18/2012, SpO2 66 %. GEN: appears unwell, uncomfortable ENT: NCAT EYES: EOMI CV: RRR, no rub, nl s1s2 PULM: clear b/l ABD: s/nt/nd MSK: b/l MCP, PIP effusions, erythrema, tenderness.  B/l Knee tenderness.  Metatarsal squeeze test + b/l SKIN: no rashes/lesiosn EXT:no LEE NEURO: AAOx3   Assessment/Plan 70F with CKD, BL SCr high 2s/low 3s, originally admitted with small ICH, transferred from [facility] 10/10/14 with AoCKD, Hyperkalemia (On ACEi/MRB), persistent metabolic acidosis.  Has arthralgias, leukocytosis, F/C.  Hx/o SLE w/ unclear course.    1. AoCKD 1. Hx/o Membranous 2007 2. BL SCr high 2s, low 3s 3. Suspect recent worsening is hypovolemic / hemodynamic 4. Agree with held furosemide, spionolactone, lisinopril 5. Daily renal panel 6. Strict I/Os 7. Will follow along, no RRT indicated        2. Hyperkalemia, mild 1. Holding meds as above 2. Would not treat hyperkalemia unless > 6 3. Metabolic Acidosis, AG about 24 1. Placed onto D5W + 176mEq NaHCO3 at 75/h 2. ? 2/2 CKD 4. Hx/o Membranous GN 1. ACEi held 2. UP/C 5. SLE 1. On MMF 1000 BID, not sure how adherent as  outpt 2. As per #6 6. Arthralgias, Polyarticular with swelling, erythema, tenderness 1. ? Inflammatory / autoimmune (RA or SLE) 2. ? Crystal based 3. Agree with possible arthrocentesis 4. Check C3, C4, ANA, dsDNA, RF, CCP 7. Low Grade Fever / Leukocytosis   1. Pt pan cultured 2. Consider noninfectious causes as well 3. Vanc / Zosyn per TR[facility] 4. Not on prednisone 5. If micro data stays negative, consider empiric prednisone 8. Recent [place] ICH 9. Anemia, borderline microcytic 1. 2u PRBC 10/10/14 2. FOBT neg  Pearson Grippe MD 10/10/2014, 2:50 PM

## 2014-10-10 NOTE — Progress Notes (Signed)
CRITICAL VALUE ALERT  Critical value received:HGB 6.2  Date of notification: 10/10/2014  Time of notification:0522  Critical value read back:Yes.    Nurse who received alert:Louis Ivery Terence Lux  MD notified (1st page):Eunice Marcello Moores  Time of first page:0522  MD notified (2nd page):  Time of second page:  Responding ZJ:IRCVEL Marcello Moores  Time MD [id number]

## 2014-10-10 NOTE — Progress Notes (Signed)
Patient's temperature reading was 101.3, discussed with patient removing extra blankets, applied thin sheet, applied ice pack to back of the neck. Will continue to monitor.  Oval Linsey, RN

## 2014-10-10 NOTE — Progress Notes (Signed)
Patient stated that she did not have the urge to void,scanned her bladder, reading was 0 ml, re-scanned and reading was still 0 ml. Offered the patient 240 ml of water, then offered an additional cup of water. Will continue to monitor.  Oval Linsey, RN

## 2014-10-10 NOTE — Progress Notes (Signed)
Physical Therapy Session Note  Patient Details  Name: [name] [name] MRN: [id number] Date of Birth: 1961/07/13  Today's Date: 10/10/2014   Pt missed 60 min of skilled physical therapy due to RN notifying therapist pt is medically unable to participate and pending transfer off unit.   Carney Living A 10/10/2014, 8:06 AM

## 2014-10-10 NOTE — Progress Notes (Signed)
Received critical lab HGB 6.2 called and made Danella Sensing aware, patient denies chest pain and shortness of breath and fatigue. Received order for type and cross. Will continue to monitor.  Oval Linsey, RN

## 2014-10-10 NOTE — Consult Note (Signed)
Orthopaedic Trauma Service (OTS)  Reason for Consult: Polyarthralgia Referring Physician: Louellen Molder, MD (internal medicine)   HPI: [name] [name] is an 53 y.o. b;ack female with history notable for lupus nephritis, CK-MB, hypertension, hyperlipidemia, ? Rheumatoid arthritis who is originally hospitalized on 09/28/2012 after sustaining a right paramedian pontine infarct. Patient was subsequently discharged to inpatient rehabilitation. She was there for approximately 3 days when she began to complain of worsening pain in her extremities, all lab work was obtained which demonstrated acute psychosis as well as drop in her H&H. Patient was transferred to the acute unit here in the hospital. Orthopedics consult requested for her polyarthralgias.  I did speak with the rehabilitation physician assistant who indicated that patient's pain is essentially been ongoing since her original admission. That essentially no significant changes. She also notes that the patient has been fairly noncompliant with her home medication regimen. She has not seen her rheumatologist in over a year. She is on CellCept chronically.  Patient seen in [facility], family at bedside. Patient is sleepy but arousable and does answer questions. She complains of diffuse pain in her neck, bilateral wrists and hands, bilateral knees and bilateral ankles. She does report some soreness in her low back as well. PT notes reviewed from her original hospitalization. It does appear that she had appropriate progression given her medical conditions albeit her progressions or slow. I did see a maximum distance ambulated about 60 feet with a rolling walker.   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 He was ambulating without any assistive devices by report of her family  Past Medical History  Diagnosis Date  . Hypertension   . Lupus   . CKD (chronic kidney disease)     due to lupus/Dr. [name]  . Diabetes  mellitus   . Stenosis of cervical spine region     with HNP at C5/6, C6/7  . Metatarsal bone fracture right    4th  . 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 Arthritis     RA  . Anemia   . Stroke     Past Surgical History  Procedure Laterality Date  . Breast biopsy      Family History  Problem Relation Age of Onset  . Hypertension    . Lupus    . Rheum arthritis    . Hypertension Mother   . Diabetes Mother   . Hypertension Sister   . Diabetes Father   . Hypertension Maternal Grandmother     Social History:  reports that she has been smoking Cigarettes.  She has a 15 pack-year smoking history. She has never used smokeless tobacco. She reports that she does not drink alcohol or use illicit drugs.  Allergies:  Allergies  Allergen Reactions  . Sulfa Antibiotics Itching    Medications:  I have reviewed the patient's current medications. Prior to Admission:  Prescriptions prior to admission  Medication Sig Dispense Refill Last Dose  . amLODipine (NORVASC) 10 MG tablet take 1 tablet by mouth once daily 30 tablet 0 10/05/14  . Cholecalciferol (VITAMIN D3) 50000 UNITS CAPS Take 1 capsule by mouth 3 (three) times a week. 30 capsule 3 Past Month at Unknown time  . ferrous sulfate 325 (65 FE) MG tablet Take 325 mg by mouth daily with breakfast.   Past Month at Unknown time  . multivitamin (RENA-VIT) TABS  tablet Take 1 tablet by mouth daily.   10/05/14  . mycophenolate (CELLCEPT) 250 MG capsule Take 1,000 mg by mouth 2 (two) times daily.   10/05/14  . nicotine (NICODERM CQ - DOSED IN MG/24 HOURS) 14 mg/24hr patch Place 1 patch onto the skin daily.   10/04/14  . cyclobenzaprine (FLEXERIL) 5 MG tablet Take 1 tablet (5 mg total) by mouth 3 (three) times daily as needed for muscle spasms. 30 tablet 0 Not yet picked up    Results for orders placed or performed during the hospital encounter of 10/10/14 (from the past 48 hour(s))  MRSA PCR  Screening     Status: None   Collection Time: 10/10/14 10:48 AM  Result Value Ref Range   MRSA by PCR NEGATIVE NEGATIVE    Comment:        The GeneXpert MRSA Assay (FDA approved for NASAL specimens only), is one component of a comprehensive MRSA colonization surveillance program. It is not intended to diagnose MRSA infection nor to guide or monitor treatment for MRSA infections.   Uric acid     Status: Abnormal   Collection Time: 10/10/14 12:05 PM  Result Value Ref Range   Uric Acid, Serum 8.3 (H) 2.4 - 7.0 mg/dL  Vitamin B12     Status: None   Collection Time: 10/10/14 12:05 PM  Result Value Ref Range   Vitamin B-12 825 211 - 911 pg/mL    Comment: Performed at [facility]  Prepare RBC     Status: None   Collection Time: 10/10/14  1:00 PM  Result Value Ref Range   Order Confirmation ORDER PROCESSED BY BLOOD BANK   Urinalysis, Routine w reflex microscopic     Status: Abnormal   Collection Time: 10/10/14  4:29 PM  Result Value Ref Range   Color, Urine YELLOW YELLOW   APPearance TURBID (A) CLEAR   Specific Gravity, Urine 1.018 1.005 - 1.030   pH 5.0 5.0 - 8.0   Glucose, UA NEGATIVE NEGATIVE mg/dL   Hgb urine dipstick MODERATE (A) NEGATIVE   Bilirubin Urine NEGATIVE NEGATIVE   Ketones, ur NEGATIVE NEGATIVE mg/dL   Protein, ur 100 (A) NEGATIVE mg/dL   Urobilinogen, UA 0.2 0.0 - 1.0 mg/dL   Nitrite NEGATIVE NEGATIVE   Leukocytes, UA NEGATIVE NEGATIVE  Sodium, urine, random     Status: None   Collection Time: 10/10/14  4:29 PM  Result Value Ref Range   Sodium, Ur <20 mEq/L  Creatinine, urine, random     Status: None   Collection Time: 10/10/14  4:29 PM  Result Value Ref Range   Creatinine, Urine 114.32 mg/dL  Urine microscopic-add on     Status: Abnormal   Collection Time: 10/10/14  4:29 PM  Result Value Ref Range   Squamous Epithelial / LPF MANY (A) RARE   WBC, UA 0-2 <3 WBC/hpf   RBC / HPF 3-6 <3 RBC/hpf   Bacteria, UA RARE RARE   Casts GRANULAR CAST (A)  NEGATIVE   Urine-Other AMORPHOUS URATES/PHOSPHATES   Protein / creatinine ratio, urine     Status: Abnormal   Collection Time: 10/10/14  4:33 PM  Result Value Ref Range   Creatinine, Urine 114.51 mg/dL   Total Protein, Urine 215.8 mg/dL    Comment: NO NORMAL RANGE ESTABLISHED FOR THIS TEST   Protein Creatinine Ratio 1.88 (H) 0.00 - 0.15    Corrected Calcium approximately 11.2 mg/dL  Dg Knee 1-2 Views Left  10/10/2014   CLINICAL DATA:  Bilateral  knee pain for several days, no known injury  EXAM: LEFT KNEE - 1-2 VIEW  COMPARISON:  None.  FINDINGS: Two views of the left knee submitted. No acute fracture or subluxation. Minimal narrowing of medial joint compartment. Small joint effusion.  IMPRESSION: No acute fracture or subluxation. Minimal narrowing of medial joint compartment. Small joint effusion.   Electronically Signed   By: Lahoma Crocker M.D.   On: 10/10/2014 14:41   Dg Knee 1-2 Views Right  10/10/2014   CLINICAL DATA:  Bilateral knee pain for several days, no known injury  EXAM: RIGHT KNEE - 1-2 VIEW  COMPARISON:  None.  FINDINGS: Two views of the right knee submitted. No acute fracture or subluxation. Mild narrowing of medial joint compartment. Small joint effusion.  IMPRESSION: No acute fracture or subluxation. Mild narrowing of medial joint compartment. Small joint effusion.   Electronically Signed   By: Lahoma Crocker M.D.   On: 10/10/2014 14:40   US Renal  10/10/2014   CLINICAL DATA:  Acute kidney injury  EXAM: RENAL/URINARY TRACT ULTRASOUND COMPLETE  COMPARISON:  CT abdomen 09/12/2009  FINDINGS: Right Kidney:  Length: 10.6 cm. Increased renal cortical echogenicity. No mass or hydronephrosis visualized. Mild fullness of the right renal collecting system.  Left Kidney:  Length: 11.2 cm. Increased renal cortical echogenicity. 2.7 x 2.2 x 2.6 cm anechoic left renal mass with increased through transmission most consistent with a cyst. No hydronephrosis visualized.  IMPRESSION: 1. Increased  bilateral renal cortical echogenicity as can be seen with medical renal disease versus acute kidney injury. No obstructive uropathy.   Electronically Signed   By: Kathreen Devoid   On: 10/10/2014 17:42   Dg Chest Port 1 View  10/10/2014   CLINICAL DATA:  Sepsis, hypertension, shortness of Breath.  EXAM: PORTABLE CHEST - 1 VIEW  COMPARISON:  10/05/2014  FINDINGS: Stable moderate cardiomegaly. Slightly improved aeration with some residual subsegmental atelectasis in the lung bases. No definite effusion Visualized skeletal structures are unremarkable.  IMPRESSION: 1. Stable cardiomegaly. 2. Improved aeration with minimal residual bibasilar subsegmental atelectasis.   Electronically Signed   By: Arne Cleveland M.D.   On: 10/10/2014 12:09    Review of Systems  Constitutional: Negative for fever and chills.  Respiratory: Negative for shortness of breath and wheezing.   Cardiovascular: Negative for chest pain and palpitations.  Gastrointestinal: Negative for nausea, vomiting and abdominal pain.  Musculoskeletal: Positive for myalgias, joint pain and neck pain.       Diffuse myalgias and arthralgias  Neurological: Negative for tingling.   Blood pressure 131/71, pulse 89, temperature 98.8 F (37.1 C), temperature source Oral, resp. rate 30, height $RemoveBe'5\' 1"'ZUwsyrAeo$  (1.549 m), weight 66.679 kg (147 lb), last menstrual period 07/18/2012, SpO2 93 %. Physical Exam  Constitutional: She is cooperative. She is easily aroused.  Sleepy   Cardiovascular: Normal rate and regular rhythm.   Pulses:      Dorsalis pedis pulses are 2+ on the right side, and 2+ on the left side.  Musculoskeletal:  Right upper extremity  Inspection:   Shoulder and elbow w/o acute findings   Wrist and hand with edema   No deformities noted  Bone and soft tissue:   Diffusely tender with evaluation    No crepitus or gross motion with palpation of osseous structures    No open wounds or lesions  ROM:   Painful ROM of elbow/wrist and  hand Sensation:   Grossly intact along R/U/M distributions  Motor:   Grossly intact  along R/U/M distributions     Intact elbow flexion, extension    Intact shoulder ABD and FF Vascular:    + radial pulse     Ext warm  Left Upper Extremity  Inspection:   Shoulder and elbow w/o acute findings   Wrist and hand with edema   No deformities noted  Bone and soft tissue:   Diffusely tender with evaluation    No crepitus or gross motion with palpation of osseous structures    No open wounds or lesions  ROM:   Painful ROM of elbow/wrist and hand Sensation:   Grossly intact along R/U/M distributions  Motor:   Grossly intact along R/U/M distributions     Intact elbow flexion, extension    Intact shoulder ABD and FF Vascular:    + radial pulse     Ext warm  Bilateral Lower Extremities  Inspection:   No gross deformities   No clinically apparent effusions of the knees or ankles   Atrophic skin changes B    dystrophic toe nail changes noted as well, ? Related to onychomycosis Bone and soft tissue:   Diffusely tender with palpation of knees and ankles    Very nonspecific but a lot of tenderness over tendon insertion/origin sites (tibial tuberosity/pes). Joint pain as well knees and ankles   No open wounds or lesion    No effusions  ROM:   Pt not willing to move hips, knees or ankles  Sensation:   DPN, SPN, TN grossly intact  Motor:   EHL, FHL, AT, PT, peroneals, gastroc grossly intact B    Pt not willing to attempt SLR, knee flexion, extension, hip flexion  Vascular:   + DP pulses   Did not appreciate PT pulses   Pelvis   Low back soreness with LC of pelvis   No gross instability   Neurological: She is easily aroused.    Assessment/Plan:  53 y/o black female with complicated history with concern for sepsis and polyarthralgias/myalgias  1. Sepsis  Per primary team  Do not think pt has septic knees or other septic joints at this time. Would expect some appreciable  effusion and other signs consistent with joint infection which I do not appreciate   Knee films do show some mild arthritis B  Also do not think this is gout again given number of areas involved and lack of findings   2. Polyarthralgias and Polymyalgias  Nephrology has ordered labs to eval for autoimmune diseases agree with these  Added CRP, ESR, and histone abs  Pt is also taking vitamin d 2 50,0000 IU's three x week, check vitamin D levels and repeat calcium levels to r/o hypervitaminosis D/hypercalcemia as this has been linked to renal damage, muscle weakness, confusion, HTN, fatigue   pts corrected calcium with albumin of 1.8 is approximately 11.2  Will also check PTH levels and urinary calcium    Appears pt is having some type of rheumatologic flare be it SLE and/or RA.    Also check CK levels    Given ? Hx of RA will check c spine films with flex and ext given neck pain      Pt needs to re-establish care with Rheumatology and pt likely needs systemic treatment     Will review xrays that are pending as well   3. Acute on chronic kidney disease  Per renal   4. Dispo  Continue per primary service   Trial of systemic steroids may be warranted if primary  team feels appropriate   Will continue to follow   Jari Pigg, PA-C [facility] [phone] (P) 10/10/2014, 6:15 PM

## 2014-10-10 NOTE — Progress Notes (Signed)
PT Cancellation Note  Patient Details Name: [name] [name] MRN: [id number] DOB: [birth year]   Cancelled Treatment:    Reason Eval/Treat Not Completed: Medical issues which prohibited therapy .  Per [name], RN, pt not ready for therapy today. 10/10/2014  [name] [name], [name] [phone]  (pager)  [name] [name], [name] 10/10/2014, 3:21 PM

## 2014-10-10 NOTE — Progress Notes (Signed)
Pt discharged from [facility] and transferred to [facility].

## 2014-10-11 ENCOUNTER — Inpatient Hospital Stay (HOSPITAL_COMMUNITY): Payer: Managed Care, Other (non HMO)

## 2014-10-11 DIAGNOSIS — E875 Hyperkalemia: Secondary | ICD-10-CM

## 2014-10-11 DIAGNOSIS — M069 Rheumatoid arthritis, unspecified: Secondary | ICD-10-CM

## 2014-10-11 LAB — COMPREHENSIVE METABOLIC PANEL
ALT: 22 U/L (ref 0–35)
AST: 26 U/L (ref 0–37)
Albumin: 1.6 g/dL — ABNORMAL LOW (ref 3.5–5.2)
Alkaline Phosphatase: 142 U/L — ABNORMAL HIGH (ref 39–117)
Anion gap: 22 — ABNORMAL HIGH (ref 5–15)
BUN: 76 mg/dL — ABNORMAL HIGH (ref 6–23)
CALCIUM: 9.2 mg/dL (ref 8.4–10.5)
CO2: 12 mEq/L — ABNORMAL LOW (ref 19–32)
Chloride: 97 mEq/L (ref 96–112)
Creatinine, Ser: 4.48 mg/dL — ABNORMAL HIGH (ref 0.50–1.10)
GFR calc Af Amer: 12 mL/min — ABNORMAL LOW (ref >90)
GFR calc non Af Amer: 10 mL/min — ABNORMAL LOW (ref >90)
Glucose, Bld: 105 mg/dL — ABNORMAL HIGH (ref 70–99)
Potassium: 5.6 mEq/L — ABNORMAL HIGH (ref 3.7–5.3)
SODIUM: 131 meq/L — AB (ref 137–147)
Total Bilirubin: 0.7 mg/dL (ref 0.3–1.2)
Total Protein: 6.8 g/dL (ref 6.0–8.3)

## 2014-10-11 LAB — ANTI-NUCLEAR AB-TITER (ANA TITER): ANA Titer 1: 1:1280 {titer} — ABNORMAL HIGH

## 2014-10-11 LAB — TYPE AND SCREEN
ABO/RH(D): O POS
Antibody Screen: NEGATIVE
UNIT DIVISION: 0
Unit division: 0

## 2014-10-11 LAB — CBC
HCT: 26 % — ABNORMAL LOW (ref 36.0–46.0)
Hemoglobin: 8.9 g/dL — ABNORMAL LOW (ref 12.0–15.0)
MCH: 28 pg (ref 26.0–34.0)
MCHC: 34.2 g/dL (ref 30.0–36.0)
MCV: 81.8 fL (ref 78.0–100.0)
PLATELETS: 217 10*3/uL (ref 150–400)
RBC: 3.18 MIL/uL — AB (ref 3.87–5.11)
RDW: 13.6 % (ref 11.5–15.5)
WBC: 21.9 10*3/uL — ABNORMAL HIGH (ref 4.0–10.5)

## 2014-10-11 LAB — VITAMIN D 25 HYDROXY (VIT D DEFICIENCY, FRACTURES): Vit D, 25-Hydroxy: 18 ng/mL — ABNORMAL LOW (ref 30–100)

## 2014-10-11 LAB — C3 COMPLEMENT: C3 Complement: 153 mg/dL (ref 90–180)

## 2014-10-11 LAB — C-REACTIVE PROTEIN: CRP: 29.4 mg/dL — ABNORMAL HIGH (ref <0.60)

## 2014-10-11 LAB — CALCIUM, URINE, RANDOM: Calcium, Ur: <1 mg/dL

## 2014-10-11 LAB — OCCULT BLOOD X 1 CARD TO LAB, STOOL: FECAL OCCULT BLD: NEGATIVE

## 2014-10-11 LAB — ANA: Anti Nuclear Antibody(ANA): POSITIVE — AB

## 2014-10-11 LAB — RHEUMATOID FACTOR: RHEUMATOID FACTOR: 66 [IU]/mL — AB (ref <=14)

## 2014-10-11 LAB — ANTI-DNA ANTIBODY, DOUBLE-STRANDED: DS DNA AB: 1 [IU]/mL

## 2014-10-11 LAB — CYCLIC CITRUL PEPTIDE ANTIBODY, IGG: Cyclic Citrullin Peptide Ab: 16.8 U/mL — ABNORMAL HIGH (ref 0.0–5.0)

## 2014-10-11 LAB — PTH, INTACT AND CALCIUM
CALCIUM TOTAL (PTH): 8.9 mg/dL (ref 8.4–10.5)
PTH: 185 pg/mL — ABNORMAL HIGH (ref 14–64)

## 2014-10-11 LAB — CALCIUM, IONIZED: Calcium, Ion: 1.28 mmol/L — ABNORMAL HIGH (ref 1.12–1.23)

## 2014-10-11 LAB — HISTONE ANTIBODIES, IGG, BLOOD: DNA-Histone: >8

## 2014-10-11 LAB — C4 COMPLEMENT: Complement C4, Body Fluid: 37 mg/dL (ref 10–40)

## 2014-10-11 MED ORDER — CAMPHOR-MENTHOL 0.5-0.5 % EX LOTN
TOPICAL_LOTION | CUTANEOUS | Status: DC | PRN
  Filled 2014-10-11: qty 222

## 2014-10-11 MED ORDER — DEXTROSE 5 % IV SOLN
INTRAVENOUS | Status: DC
  Administered 2014-10-11 – 2014-10-13 (×4): via INTRAVENOUS
  Filled 2014-10-11 (×6): qty 1000

## 2014-10-11 MED ORDER — VITAMIN D (ERGOCALCIFEROL) 1.25 MG (50000 UNIT) PO CAPS
50000.0000 [IU] | ORAL_CAPSULE | ORAL | Status: DC
  Administered 2014-10-11: 50000 [IU] via ORAL
  Filled 2014-10-11: qty 1

## 2014-10-11 MED ORDER — CYCLOBENZAPRINE HCL 10 MG PO TABS: 5.0000 mg | ORAL_TABLET | Freq: Three times a day (TID) | ORAL | Status: DC | PRN

## 2014-10-11 MED ORDER — PREDNISONE 50 MG PO TABS
60.0000 mg | ORAL_TABLET | Freq: Every day | ORAL | Status: DC
  Administered 2014-10-11 – 2014-10-13 (×3): 60 mg via ORAL
  Filled 2014-10-11 (×6): qty 1

## 2014-10-11 MED ORDER — PANTOPRAZOLE SODIUM 40 MG PO TBEC
40.0000 mg | DELAYED_RELEASE_TABLET | Freq: Two times a day (BID) | ORAL | Status: DC
  Administered 2014-10-11 – 2014-10-17 (×13): 40 mg via ORAL
  Filled 2014-10-11 (×12): qty 1

## 2014-10-11 MED ORDER — LABETALOL HCL 100 MG PO TABS
100.0000 mg | ORAL_TABLET | Freq: Two times a day (BID) | ORAL | Status: DC
  Administered 2014-10-11 (×2): 100 mg via ORAL
  Filled 2014-10-11 (×4): qty 1

## 2014-10-11 MED ORDER — SODIUM BICARBONATE 650 MG PO TABS
1300.0000 mg | ORAL_TABLET | Freq: Three times a day (TID) | ORAL | Status: DC
  Administered 2014-10-11 – 2014-10-13 (×7): 1300 mg via ORAL
  Filled 2014-10-11 (×9): qty 2

## 2014-10-11 NOTE — Progress Notes (Deleted)
10/11/2014 Patient came back to [place] from intervention radiology at 1815. Dressing to the right groin after having a angiogram is clean and dry. Patient have pulses noted in right femoral plus 2 and in feet. Skin was warm to touch.

## 2014-10-11 NOTE — Progress Notes (Signed)
Admit: 10/10/2014 LOS: 1  65F with CKD, BL SCr high 2s/low 3s, originally admitted with small ICH, transferred from [facility] 10/10/14 with AoCKD, Hyperkalemia (On ACEi/MRB), persistent metabolic acidosis. Has arthralgias, leukocytosis, F/C. Hx/o SLE w/ unclear course.   Subjective:  Seen by orthopedics and GI +RF, ESR.  Complements normal UA with 3-6 RBC / HPF; no pyuria, minimal proteinuria Renal US with no obstruction. Medical renal disease.  Normal size Culture data reviewed, on Vanc/Zosyn Tranfused 2u PRBC  12/08 0701 - 12/09 0700 In: 6659 [I.V.:450; Blood:670; IV Piggyback:150] Out: -   Filed Weights   10/10/14 1049 10/11/14 0006  Weight: 66.679 kg (147 lb) 65.8 kg (145 lb 1 oz)    Scheduled Meds: . sodium chloride   Intravenous Once  . amLODipine  10 mg Oral Daily  . antiseptic oral rinse  7 mL Mouth Rinse BID  . ferrous sulfate  325 mg Oral Q breakfast  . multivitamin  1 tablet Oral Daily  . mycophenolate  1,000 mg Oral BID  . nicotine  14 mg Transdermal Q24H  . pantoprazole (PROTONIX) IV  40 mg Intravenous Q12H  . piperacillin-tazobactam (ZOSYN)  IV  2.25 g Intravenous Q8H  . sodium bicarbonate  1,300 mg Oral TID  . sodium chloride  3 mL Intravenous Q12H  . vancomycin  1,000 mg Intravenous Q48H  . Vitamin D (Ergocalciferol)  50,000 Units Oral Q M,W,F   Continuous Infusions: . dextrose 5 % 1,000 mL with sodium bicarbonate 100 mEq infusion 75 mL/hr at 10/10/14 2112   PRN Meds:.acetaminophen **OR** acetaminophen, ondansetron **OR** ondansetron (ZOFRAN) IV  Current Labs: reviewed    Physical Exam:  Blood pressure 156/76, pulse 104, temperature 98.7 F (37.1 C), temperature source Oral, resp. rate 30, height 5' 5.5" (1.664 m), weight 65.8 kg (145 lb 1 oz), last menstrual period 07/18/2012, SpO2 93 %. GEN: appears unwell, uncomfortable ENT: NCAT EYES: EOMI CV: RRR, no rub, nl s1s2 PULM: clear b/l ABD: s/nt/nd MSK: b/l MCP, PIP effusions, erythrema, tenderness. B/l  Knee tenderness. Metatarsal squeeze test + b/l SKIN: no rashes/lesiosn EXT:no LEE NEURO: AAOx3  A/P 1. AoCKD 1. Hx/o Membranous 2007 2. BL SCr high 2s, low 3s 3. Renal US 10/10/14 w/o obstruction 4. Suspect recent worsening is hypovolemic / hemodynamic 5. Cont to hold furosemide, spionolactone, lisinopril 6. Daily renal panel 7. Strict I/Os 8. Will follow along, no RRT indicated 9. Foley in place  2. Hyperkalemia, mild 1. Holding meds as above 2. Would not treat hyperkalemia unless > 6 3. Metabolic Acidosis, AG about 24 1. Cont on D5W + 151mq NaHCO3 at 75/h 2. ? 2/2 CKD 3. Add NaHCO3 130102mPO TID today 4. Monitor serum Na with slightly hypotonic fluids 4. Hx/o Membranous GN 1. ACEi held 2. UP/C 1.88 on 10/10/14 5. SLE 1. On MMF 1000 BID, not sure how adherent as outpt 2. As per #6 6. Arthralgias, Polyarticular with swelling, erythema, tenderness 1. ? Inflammatory / autoimmune (RA or SLE) 2. Serologies reveiwed / pending: C3, C4, ANA, dsDNA, RF, CCP 3. I doubt this is septic joints; favor trial of prednisone, perhaps [id number] 4. Not sure how much MMF is helping here 7. Low Grade Fever / Leukocytosis  1. Pt pan cultured 12/8; NGTD 2. Could be 2/2 #6 3. Vanc / Zosyn per TRH 8. Recent Pontine ICH 9. Anemia, borderline microcytic 1. 2u PRBC 10/10/14 2. FOBT neg 3. GI following,  RyaPearson Grippe 10/11/2014, 8:36 AM   Recent Labs Lab 10/06/14 [id number] 7/06/18  0726 10/11/14 0417  NA 139 132* 131*  K 4.3 5.5* 5.6*  CL 108 98 97  CO2 15* 14* 12*  GLUCOSE 88 105* 105*  BUN 49* 74* 76*  CREATININE 3.04* 4.45* 4.48*  CALCIUM 9.1 9.4 9.2    Recent Labs Lab 10/05/14 0521 10/06/14 0611  10/10/14 0439 10/10/14 0726 10/11/14 0417  WBC 8.8 8.1  --  20.1* 21.9* 21.9*  NEUTROABS 7.3 6.6  --   --  19.9*  --   HGB 7.7* 8.2*  < > 6.2* 6.1* 8.9*  HCT 23.8* 25.0*  < > 18.3* 18.9* 26.0*  MCV 80.4 83.3  --  82.1 80.4 81.8  PLT 213 216  --  218 232 217  < > =  values in this interval not displayed.

## 2014-10-11 NOTE — Evaluation (Signed)
Clinical/Bedside Swallow Evaluation Patient Details  Name: [name] [name] MRN: [id number] Date of Birth: Nov 24, 1960  Today's Date: 10/11/2014 Time: 0830-0915 SLP Time Calculation (min) (ACUTE ONLY): 45 min  Past Medical History:  Past Medical History  Diagnosis Date  . Hypertension   . Lupus   . CKD (chronic kidney disease)     due to lupus/Dr. Hyman [name]  . Diabetes mellitus   . Stenosis of cervical spine region     with HNP at C5/6, C6/7  . Metatarsal bone fracture right    4th  . 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 Arthritis     RA  . Anemia   . Stroke    Past Surgical History:  Past Surgical History  Procedure Laterality Date  . Breast biopsy     HPI:  Patient is a 53 y/o female admitted with left side weakness and gaze preference with BP 238/88.  CT showed acute pontine hemorrhagic infarct.  PMH positive for hyperlipidemia, HTN and compliance issues. Patient with new onset dysphagia 11/30 (patient reported) and increased lethargy. Repeat MRI complete 12/1 and indicated signal changes compatible with acute/subacute hemorrhage in the paramedian pons. Patient had FEES and was recommended regular textures with thin liquids. Patient transferred to CIR on 10/05/14 but then was discharged back to acute on 12/8 due to questionable sepsis. BSE today to assess swallow function.    Assessment / Plan / Recommendation Clinical Impression  Orders received and patient administered a BSE. Oral care was provided and patient demonstrated what appeared to be a timely swallow with thin liquids via cup and puree textures without overt s/s of aspiration. Patient also consumed trials of Dys. 2 textures and demonstrated prolonged mastication with moderate oral residue, suspect due to generalized weakness and fatigue. Patient's RR and O2 saturations also fluctuated throughout the evaluation, suspect due to lethargy and pain, especially when attempting to  self-feed.  Recommend patient initiate Dys. 1 textures with thin liquids with full supervision in order to maximize swallowing safety and overall energy conservation.  Patient may also require assistance with self-feeding due to severe joint pain. SLP will f/u for diet tolerance.     Aspiration Risk  Moderate    Diet Recommendation Thin liquid;Dysphagia 1 (Puree)   Liquid Administration via: Cup;No straw Medication Administration: Crushed with puree Supervision: Full supervision/cueing for compensatory strategies;Staff to assist with self feeding;Patient able to self feed Compensations: Slow rate;Small sips/bites;Clear throat intermittently Postural Changes and/or Swallow Maneuvers: Upright 30-60 min after meal;Seated upright 90 degrees    Other  Recommendations Oral Care Recommendations: Oral care BID   Follow Up Recommendations  Inpatient Rehab    Frequency and Duration min 1 x/week  1 week   Pertinent Vitals/Pain Patient reports severe pain in joints, especially with any movement     Swallow Study       General Date of Onset: 09/28/14 HPI: Patient is a 53 y/o female admitted with left side weakness and gaze preference with BP 238/88.  CT showed acute pontine hemorrhagic infarct.  PMH positive for hyperlipidemia, HTN and compliance issues. Patient with new onset dysphagia 11/30 (patient reported) and increased lethargy. Repeat MRI complete 12/1 and indicated signal changes compatible with acute/subacute hemorrhage in the paramedian pons. Patient had FEES and was recommended regular textures with thin liquids. Patient transferred to CIR on 10/05/14 but then was discharged back to acute on 12/8 due to questionable sepsis.  BSE today to assess swallow function.  Type of Study: Bedside swallow evaluation Previous Swallow Assessment: FEES 12/2 and recommended regular textures with thin liquids  Diet Prior to this Study: NPO Temperature Spikes Noted: No Respiratory Status: Room  air History of Recent Intubation: No Behavior/Cognition: Lethargic;Cooperative;Pleasant mood Oral Cavity - Dentition: Adequate natural dentition;Missing dentition Self-Feeding Abilities: Able to feed self;Needs assist Patient Positioning: Upright in bed Baseline Vocal Quality: Clear;Low vocal intensity Volitional Cough: Strong Volitional Swallow: Able to elicit    Oral/Motor/Sensory Function Overall Oral Motor/Sensory Function: Impaired Lingual Sensation: Reduced (left)   Ice Chips Ice chips: Within functional limits Presentation: Spoon   Thin Liquid Thin Liquid: Within functional limits Presentation: Self Fed;Cup    Nectar Thick Nectar Thick Liquid: Not tested   Honey Thick Honey Thick Liquid: Not tested   Puree Puree: Within functional limits Presentation: Self Fed;Spoon   Solid   GO    Solid: Impaired Presentation: Self Fed;Spoon Oral Phase Impairments: Impaired mastication Oral Phase Functional Implications: Oral residue Other Comments: Patient with proloned masticatin and moderate lingual residue with Dys. 2 textures       [name] [name] 10/11/2014,9:38 AM  Feliberto Gottron, MA, [name] [phone]

## 2014-10-11 NOTE — Progress Notes (Signed)
Patient c/o itchy skin.  MD notified. Awaiting new orders. RN will continue to monitor. Shellee Milo, RN

## 2014-10-11 NOTE — Progress Notes (Signed)
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  Workup showed nonsignificant carotid stenosis and 2-D echo noted severe LVH.  During that hospitalization she developed mild pulmonary edema which improved with diuresis. Follow up head CT and MRI brain showed improvement in the size of hemorrhage. Patient was discharged to inpatient rehabilitation for ongoing physical therapy on 12/3.   Patient reported she had not been participating in rehabilitation much due to worsening pain in her knees and ankles. Blood work revealed acute leukocytosis and progressive drop in H&H (11>9>8>6.1). Stool for occult blood done was negative. Chemistries noted anion gap metabolic acidosis with hyperkalemia and worsening renal function. Reportedly patient had poor by mouth intake. Patient had been reporting pain in her neck, worsened wrists, elbows, bilateral knee and ankle pain.   HPI/Subjective: Pt c/o achy joints "all over."  Denies cp, sob, n/v, or abdom pain.    Assessment/Plan:  SIRS No evidence of active infection - most c/w connective tissue disease flair - SIRS physiology improving - stop abx and follow  Acute anemia progressive drop in her Hgb from 11.7 to 6.1 - stool occult blood negative - transfused 2 units PRBC - Hgb improved accordingly - follow trend - Fe studies c/w ACD  Right pontine hemorrhage with acute encephalopathy and ? New RUE weakness acute encephalopathy and right upper extremity weakness seems new - had residual left upper extremity weakness during recent  hospitalization - follow-up MRI brain w/o acute findings - delay PT/OT until joints cool off a bit   Acute renal failure superimposed on stage 4 chronic kidney disease acute worsening of her underlying chronic kidney disease - baseline appears to be 3.2-3.5 - Lasix and ACE inhibitor discontinued - on CellCept for her underlying lupus so check level - Nephrology following - no obstruction on renal US  Hyperkalemia Improving on IV bicarb - follow   Rheumatoid arthritis  Followed with Dr. [name] as outpt until one year ago - on CellCept  Acute polyarthritis - Progressive diffuse polyarthritis Most c/w flair of rheumatoid arthritis - serologic w/u underway - x-rays unrevealing - agree w/ trial of prednisone 60mg  daily   Essential hypertension BP trending up - adjust med tx and follow trend   Code Status: FULL Family Communication: no family present at time of exam Disposition Plan: SDU  Consultants: Ortho GI Nephrology  Procedures: EEG - 12/8 - normal   Antibiotics: Zosyn 12/8 Vanc 12/8  DVT prophylaxis: SCDs  Objective: Blood pressure 156/76, pulse 104, temperature 98.7 F (37.1 C), temperature source Oral, resp. rate 30, height 5' 5.5" (1.664 m), weight 65.8 kg (145 lb 1 oz), last menstrual period 07/18/2012, SpO2 93 %.  Intake/Output Summary (Last 24 hours) at 10/11/14 9470 Last data filed at 10/11/14 0316  Gross per 24 hour  Intake   1270 ml  Output      0 ml  Net   1270 ml   Exam: General: No acute respiratory distress Lungs: Clear to auscultation bilaterally without wheezes or crackles Cardiovascular: Regular rhythm - tachycardic - without murmur gallop or rub normal S1 and S2 Abdomen: Nontender, nondistended, soft, bowel sounds positive, no rebound, no ascites,  no appreciable mass Extremities: No significant cyanosis, clubbing, or edema bilateral lower extremities  Data Reviewed: Basic Metabolic Panel:  Recent Labs Lab 10/05/14 0521 10/06/14 0611  10/10/14 0726 10/11/14 0417  NA 139 139 132* 131*  K 4.0 4.3 5.5* 5.6*  CL 108 108 98 97  CO2 15* 15* 14* 12*  GLUCOSE 114* 88 105* 105*  BUN 50* 49* 74* 76*  CREATININE 2.86* 3.04* 4.45* 4.48*  CALCIUM 8.9 9.1 9.4 9.2    Liver Function Tests:  Recent Labs Lab 10/05/14 0521 10/06/14 0611 10/10/14 0726 10/11/14 0417  AST 12 11 31 26   ALT 7 7 23 22   ALKPHOS 83 85 111 142*  BILITOT 0.3 0.3 0.6 0.7  PROT 6.6 6.8 7.1 6.8  ALBUMIN 1.9* 1.8* 1.8* 1.6*   CBC:  Recent Labs Lab 10/05/14 0521 10/06/14 0611 10/07/14 1118 10/10/14 0439 10/10/14 0726 10/11/14 0417  WBC 8.8 8.1  --  20.1* 21.9* 21.9*  NEUTROABS 7.3 6.6  --   --  19.9*  --   HGB 7.7* 8.2* 8.1* 6.2* 6.1* 8.9*  HCT 23.8* 25.0* 23.9* 18.3* 18.9* 26.0*  MCV 80.4 83.3  --  82.1 80.4 81.8  PLT 213 216  --  218 232 217    Cardiac Enzymes:  Recent Labs Lab 10/10/14 1900  CKTOTAL 244*   BNP (last 3 results)  Recent Labs  10/05/14 0521  PROBNP 20673.0*    CBG:  Recent Labs Lab 10/05/14 1059  GLUCAP 146*    Recent Results (from the past 240 hour(s))  Urine culture     Status: None   Collection Time: 10/05/14  7:03 PM  Result Value Ref Range Status   Specimen Description URINE, CLEAN CATCH  Final   Special Requests NONE  Final   Culture  Setup Time   Final    10/05/2014 23:02 Performed at [place]   Final    30,000 COLONIES/ML Performed at [facility]    Culture   Final    Multiple bacterial morphotypes present, none predominant. Suggest appropriate recollection if clinically indicated. Performed at [facility]    Report Status 10/06/2014 FINAL  Final  Culture, blood (routine x 2)     Status: None (Preliminary result)   Collection Time: 10/10/14  9:38 AM  Result Value Ref Range Status   Specimen Description BLOOD LEFT FOREARM  Final   Special Requests BOTTLES DRAWN AEROBIC ONLY 5CC  Final   Culture  Setup Time   Final    10/10/2014  14:39 Performed at [facility]    Culture   Final           BLOOD CULTURE RECEIVED NO GROWTH TO DATE CULTURE WILL BE HELD FOR 5 DAYS BEFORE ISSUING A FINAL NEGATIVE REPORT Performed at [facility]    Report Status PENDING  Incomplete  Culture, blood (routine x 2)     Status: None (Preliminary result)   Collection Time: 10/10/14  9:44 AM  Result Value Ref Range Status   Specimen Description BLOOD LEFT ARM  Final   Special Requests   Final    BOTTLES DRAWN AEROBIC AND ANAEROBIC 5CC BLUE  4CC RED   Culture  Setup Time   Final    10/10/2014 14:39 Performed at [facility]    Culture   Final           BLOOD CULTURE RECEIVED NO GROWTH TO DATE CULTURE WILL BE HELD FOR  5 DAYS BEFORE ISSUING A FINAL NEGATIVE REPORT Performed at [facility]    Report Status PENDING  Incomplete  MRSA PCR Screening     Status: None   Collection Time: 10/10/14 10:48 AM  Result Value Ref Range Status   MRSA by PCR NEGATIVE NEGATIVE Final    Comment:        The GeneXpert MRSA Assay (FDA approved for NASAL specimens only), is one component of a comprehensive MRSA colonization surveillance program. It is not intended to diagnose MRSA infection nor to guide or monitor treatment for MRSA infections.      Studies:  Recent x-ray studies have been reviewed in detail by the Attending Physician  Scheduled Meds:  Scheduled Meds: . sodium chloride   Intravenous Once  . amLODipine  10 mg Oral Daily  . antiseptic oral rinse  7 mL Mouth Rinse BID  . ferrous sulfate  325 mg Oral Q breakfast  . multivitamin  1 tablet Oral Daily  . mycophenolate  1,000 mg Oral BID  . nicotine  14 mg Transdermal Q24H  . pantoprazole (PROTONIX) IV  40 mg Intravenous Q12H  . piperacillin-tazobactam (ZOSYN)  IV  2.25 g Intravenous Q8H  . sodium bicarbonate  1,300 mg Oral TID  . sodium chloride  3 mL Intravenous Q12H  . vancomycin  1,000 mg Intravenous Q48H  . Vitamin D (Ergocalciferol)   50,000 Units Oral Q7 days    Time spent on care of this patient: 35 mins   [facility] T , MD   [facility] Office  [phone] Pager - Text Page per Shea Evans as per below:  On-Call/Text Page:      [email or web address]      password [email or web address]  If 7PM-7AM, please contact night-coverage [email or web address] Password [email or web address] 10/11/2014, 9:28 AM   LOS: 1 day

## 2014-10-11 NOTE — Consult Note (Signed)
[facility] Consult Note  Referring Provider: No ref. provider found Primary Care Physician:  Bobby Rumpf, MD Primary Gastroenterologist:  Dr.  Laurel Dimmer Complaint: Anemia HPI: [name] [name] is an 53 y.o. black female  transferred from stroke rehabilitation with a history of lupus and recent hemorrhagic pontine infarct. On rehabilitation he became very lethargic with low-grade temperature. Her hemoglobin was also noted to progressively dropped from 11-9-6.1 over several days. There is not any report of any emesis melena or hematochezia. She had one heme-negative stool upon transfer. She states she has had a colonoscopy about 5 or 6 years ago but is not sure where. She does not think she has had an EGD. She denies any dysphagia nausea vomiting abdominal pain or rectal bleeding.  Past Medical History  Diagnosis Date  . Hypertension   . Lupus   . CKD (chronic kidney disease)     due to lupus/Dr. [name]  . Diabetes mellitus   . Stenosis of cervical spine region     with HNP at C5/6, C6/7  . Metatarsal bone fracture right    4th  . 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 Arthritis     RA  . Anemia   . Stroke     Past Surgical History  Procedure Laterality Date  . Breast biopsy      Medications Prior to Admission  Medication Sig Dispense Refill  . amLODipine (NORVASC) 10 MG tablet take 1 tablet by mouth once daily 30 tablet 0  . Cholecalciferol (VITAMIN D3) 50000 UNITS CAPS Take 1 capsule by mouth 3 (three) times a week. 30 capsule 3  . ferrous sulfate 325 (65 FE) MG tablet Take 325 mg by mouth daily with breakfast.    . multivitamin (RENA-VIT) TABS tablet Take 1 tablet by mouth daily.    . mycophenolate (CELLCEPT) 250 MG capsule Take 1,000 mg by mouth 2 (two) times daily.    . nicotine (NICODERM CQ - DOSED IN MG/24 HOURS) 14 mg/24hr patch Place 1 patch onto the skin daily.    . cyclobenzaprine (FLEXERIL) 5 MG tablet Take  1 tablet (5 mg total) by mouth 3 (three) times daily as needed for muscle spasms. 30 tablet 0    Allergies:  Allergies  Allergen Reactions  . Sulfa Antibiotics Itching    Family History  Problem Relation Age of Onset  . Hypertension    . Lupus    . Rheum arthritis    . Hypertension Mother   . Diabetes Mother   . Hypertension Sister   . Diabetes Father   . Hypertension Maternal Grandmother     Social History:  reports that she has been smoking Cigarettes.  She has a 15 pack-year smoking history. She has never used smokeless tobacco. She reports that she does not drink alcohol or use illicit drugs.  Review of Systems: negative except as above   Blood pressure 156/76, pulse 104, temperature 98.7 F (37.1 C), temperature source Oral, resp. rate 30, height 5' 5.5" (1.664 m), weight 65.8 kg (145 lb 1 oz), last menstrual period 07/18/2012, SpO2 93 %. Head: Normocephalic, without obvious abnormality, atraumatic Neck: no adenopathy, no carotid bruit, no JVD, supple, symmetrical, trachea midline and thyroid not enlarged, symmetric, no tenderness/mass/nodules Resp: clear to auscultation bilaterally Cardio: regular rate and rhythm, S1, S2 normal, no murmur, click, rub or gallop GI: Abdomen soft, nondistended with normoactive bowel sounds. No hepatosplenomegaly mass  or guarding Extremities: extremities normal, atraumatic, no cyanosis or edema  Results for orders placed or performed during the hospital encounter of 10/10/14 (from the past 48 hour(s))  MRSA PCR Screening     Status: None   Collection Time: 10/10/14 10:48 AM  Result Value Ref Range   MRSA by PCR NEGATIVE NEGATIVE    Comment:        The GeneXpert MRSA Assay (FDA approved for NASAL specimens only), is one component of a comprehensive MRSA colonization surveillance program. It is not intended to diagnose MRSA infection nor to guide or monitor treatment for MRSA infections.   Uric acid     Status: Abnormal    Collection Time: 10/10/14 12:05 PM  Result Value Ref Range   Uric Acid, Serum 8.3 (H) 2.4 - 7.0 mg/dL  Iron and TIBC     Status: Abnormal   Collection Time: 10/10/14 12:05 PM  Result Value Ref Range   Iron 42 42 - 135 ug/dL   TIBC 130 (L) 250 - 470 ug/dL   Saturation Ratios 32 20 - 55 %   UIBC 88 (L) 125 - 400 ug/dL    Comment: Performed at [facility]  Vitamin B12     Status: None   Collection Time: 10/10/14 12:05 PM  Result Value Ref Range   Vitamin B-12 825 211 - 911 pg/mL    Comment: Performed at [facility]  Prepare RBC     Status: None   Collection Time: 10/10/14  1:00 PM  Result Value Ref Range   Order Confirmation ORDER PROCESSED BY BLOOD BANK   Urinalysis, Routine w reflex microscopic     Status: Abnormal   Collection Time: 10/10/14  4:29 PM  Result Value Ref Range   Color, Urine YELLOW YELLOW   APPearance TURBID (A) CLEAR   Specific Gravity, Urine 1.018 1.005 - 1.030   pH 5.0 5.0 - 8.0   Glucose, UA NEGATIVE NEGATIVE mg/dL   Hgb urine dipstick MODERATE (A) NEGATIVE   Bilirubin Urine NEGATIVE NEGATIVE   Ketones, ur NEGATIVE NEGATIVE mg/dL   Protein, ur 100 (A) NEGATIVE mg/dL   Urobilinogen, UA 0.2 0.0 - 1.0 mg/dL   Nitrite NEGATIVE NEGATIVE   Leukocytes, UA NEGATIVE NEGATIVE  Sodium, urine, random     Status: None   Collection Time: 10/10/14  4:29 PM  Result Value Ref Range   Sodium, Ur <20 mEq/L  Creatinine, urine, random     Status: None   Collection Time: 10/10/14  4:29 PM  Result Value Ref Range   Creatinine, Urine 114.32 mg/dL  Urine microscopic-add on     Status: Abnormal   Collection Time: 10/10/14  4:29 PM  Result Value Ref Range   Squamous Epithelial / LPF MANY (A) RARE   WBC, UA 0-2 <3 WBC/hpf   RBC / HPF 3-6 <3 RBC/hpf   Bacteria, UA RARE RARE   Casts GRANULAR CAST (A) NEGATIVE   Urine-Other AMORPHOUS URATES/PHOSPHATES   Protein / creatinine ratio, urine     Status: Abnormal   Collection Time: 10/10/14  4:33 PM  Result Value  Ref Range   Creatinine, Urine 114.51 mg/dL   Total Protein, Urine 215.8 mg/dL    Comment: NO NORMAL RANGE ESTABLISHED FOR THIS TEST   Protein Creatinine Ratio 1.88 (H) 0.00 - 0.15  C3 complement     Status: None   Collection Time: 10/10/14  7:00 PM  Result Value Ref Range   C3 Complement 153 90 - 180 mg/dL  Comment: Performed at [place] complement     Status: None   Collection Time: 10/10/14  7:00 PM  Result Value Ref Range   Complement C4, Body Fluid 37 10 - 40 mg/dL    Comment: Performed at [facility]  Rheumatoid factor     Status: Abnormal   Collection Time: 10/10/14  7:00 PM  Result Value Ref Range   Rhuematoid fact SerPl-aCnc 66 (H) <=14 IU/mL    Comment: (NOTE)                         Interpretive Table                    Low Positive: 15 - 41 IU/mL                    High Positive:  >= 42 IU/mL In addition to the RF result, and clinical symptoms including joint involvement, the 2010 ACR Classification Criteria for scoring/diagnosing Rheumatoid Arthritis include the results of the following tests:  CRP (65537), ESR (15010), and CCP (APCA) (48270). www.rheumatology.org/practice/clinical/classification/ra/ra_2010.asp Performed at [facility]   Sedimentation rate     Status: Abnormal   Collection Time: 10/10/14  7:00 PM  Result Value Ref Range   Sed Rate >140 (H) 0 - 22 mm/hr  C-reactive protein     Status: Abnormal   Collection Time: 10/10/14  7:00 PM  Result Value Ref Range   CRP 29.4 (H) <0.60 mg/dL    Comment: Performed at [facility]  CK     Status: Abnormal   Collection Time: 10/10/14  7:00 PM  Result Value Ref Range   Total CK 244 (H) 7 - 177 U/L  Vit D  25 hydroxy (routine osteoporosis monitoring)     Status: Abnormal   Collection Time: 10/10/14  7:00 PM  Result Value Ref Range   Vit D, 25-Hydroxy 18 (L) 30 - 100 ng/mL    Comment: (NOTE) ** Please note change in reference range(s). ** Vitamin D Status            25-OH Vitamin D       Deficiency                <20 ng/mL       Insufficiency         20 - 29 ng/mL       Optimal             > or = 30 ng/mL For 25-OH Vitamin D testing on patients on D2-supplementation and patients for whom quantitation of D2 and D3 fractions is required, the QuestAssureD 25-OH VIT D, (D2,D3), LC/MS/MS is recommended: order code [id number] (patients > 2 yrs). Performed at [facility]   Calcium, ionized     Status: Abnormal   Collection Time: 10/10/14  7:31 PM  Result Value Ref Range   Calcium, Ion 1.28 (H) 1.12 - 1.23 mmol/L    Comment: Performed at [facility]  CBC     Status: Abnormal   Collection Time: 10/11/14  4:17 AM  Result Value Ref Range   WBC 21.9 (H) 4.0 - 10.5 K/uL   RBC 3.18 (L) 3.87 - 5.11 MIL/uL   Hemoglobin 8.9 (L) 12.0 - 15.0 g/dL    Comment: DELTA CHECK NOTED POST TRANSFUSION SPECIMEN    HCT 26.0 (L) 36.0 - 46.0 %   MCV 81.8 78.0 - 100.0  fL   MCH 28.0 26.0 - 34.0 pg   MCHC 34.2 30.0 - 36.0 g/dL   RDW 13.6 11.5 - 15.5 %   Platelets 217 150 - 400 K/uL  Comprehensive metabolic panel     Status: Abnormal   Collection Time: 10/11/14  4:17 AM  Result Value Ref Range   Sodium 131 (L) 137 - 147 mEq/L   Potassium 5.6 (H) 3.7 - 5.3 mEq/L   Chloride 97 96 - 112 mEq/L   CO2 12 (L) 19 - 32 mEq/L   Glucose, Bld 105 (H) 70 - 99 mg/dL   BUN 76 (H) 6 - 23 mg/dL   Creatinine, Ser 4.48 (H) 0.50 - 1.10 mg/dL   Calcium 9.2 8.4 - 10.5 mg/dL   Total Protein 6.8 6.0 - 8.3 g/dL   Albumin 1.6 (L) 3.5 - 5.2 g/dL   AST 26 0 - 37 U/L   ALT 22 0 - 35 U/L   Alkaline Phosphatase 142 (H) 39 - 117 U/L   Total Bilirubin 0.7 0.3 - 1.2 mg/dL   GFR calc non Af Amer 10 (L) >90 mL/min   GFR calc Af Amer 12 (L) >90 mL/min    Comment: (NOTE) The eGFR has been calculated using the CKD EPI equation. This calculation has not been validated in all clinical situations. eGFR's persistently <90 mL/min signify possible Chronic Kidney Disease.    Anion gap 22 (H)  5 - 15   Dg Cervical Spine With Flex & Extend  10/10/2014   CLINICAL DATA:  Full body pain, no known injury.Pt has pain and swelling on the posterior portion of both of her hands. Her head is "heavy" and tilts to the right, with extreme neck pain. Both ankles are in pain and are stiff.Hx of Stroke 1 week ago, left sided weakness as a result. Pt also has a Hx of Lupus and a Fx on her right foot as a result Chronic ankle pain  EXAM: CERVICAL SPINE COMPLETE WITH FLEXION AND EXTENSION VIEWS  COMPARISON:  MR 03/12/2006 and companion radiographs  FINDINGS: No evidence of dynamic instability on lateral flexion-extension. There is narrowing of the C5-6 interspace with small anterior endplate spurs. Negative for fracture or dislocation. The cervicothoracic junction is not well visualized. No swimmer's view was submitted.  IMPRESSION: 1. Negative for fracture or other acute bone abnormality. 2. No dynamic instability. 3. Degenerative disc disease C5-6.   Electronically Signed   By: Arne Cleveland M.D.   On: 10/10/2014 19:16   Dg Wrist Complete Left  10/10/2014   CLINICAL DATA:  Full body pain, no known injury.Pt has pain and swelling on the posterior portion of both of her hands. Her head is "heavy" and tilts to the right, with extreme neck pain. Both ankles are in pain and are stiff.Hx of Stroke 1 week ago, left sided weakness as a result. Pt also has a Hx of Lupus and a Fx on her right foot as a result  EXAM: LEFT WRIST - COMPLETE 3+ VIEW  COMPARISON:  None.  FINDINGS: There is no evidence of fracture or dislocation. There is no evidence of arthropathy or other focal bone abnormality. Soft tissues are unremarkable.  IMPRESSION: Negative.   Electronically Signed   By: Arne Cleveland M.D.   On: 10/10/2014 19:06   Dg Wrist Complete Right  10/10/2014   CLINICAL DATA:  Full body pain, no known injury.Pt has pain and swelling on the posterior portion of both of her hands. Her head is "heavy"  and tilts to the right,  with extreme neck pain. Both ankles are in pain and are stiff.Hx of Stroke 1 week ago, left sided weakness as a result. Pt also has a Hx of Lupus and a Fx on her right foot as a result Chronic ankle pain  EXAM: RIGHT WRIST - COMPLETE 3+ VIEW  COMPARISON:  None.  FINDINGS: There is no evidence of fracture or dislocation. There is no evidence of arthropathy or other focal bone abnormality. Soft tissues are unremarkable. Carpal rows intact.  IMPRESSION: Negative.   Electronically Signed   By: Arne Cleveland M.D.   On: 10/10/2014 19:18   Dg Knee 1-2 Views Left  10/10/2014   CLINICAL DATA:  Bilateral knee pain for several days, no known injury  EXAM: LEFT KNEE - 1-2 VIEW  COMPARISON:  None.  FINDINGS: Two views of the left knee submitted. No acute fracture or subluxation. Minimal narrowing of medial joint compartment. Small joint effusion.  IMPRESSION: No acute fracture or subluxation. Minimal narrowing of medial joint compartment. Small joint effusion.   Electronically Signed   By: Lahoma Crocker M.D.   On: 10/10/2014 14:41   Dg Knee 1-2 Views Right  10/10/2014   CLINICAL DATA:  Bilateral knee pain for several days, no known injury  EXAM: RIGHT KNEE - 1-2 VIEW  COMPARISON:  None.  FINDINGS: Two views of the right knee submitted. No acute fracture or subluxation. Mild narrowing of medial joint compartment. Small joint effusion.  IMPRESSION: No acute fracture or subluxation. Mild narrowing of medial joint compartment. Small joint effusion.   Electronically Signed   By: Lahoma Crocker M.D.   On: 10/10/2014 14:40   Dg Ankle Complete Left  10/10/2014   CLINICAL DATA:  Full body pain, no known injury.Pt has pain and swelling on the posterior portion of both of her hands. Her head is "heavy" and tilts to the right, with extreme neck pain. Both ankles are in pain and are stiff.Hx of Stroke 1 week ago, left sided weakness as a result. Pt also has a Hx of Lupus and a Fx on her right foot as a result Chronic ankle pain  EXAM:  LEFT ANKLE COMPLETE - 3+ VIEW  COMPARISON:  MR 06/24/2007  FINDINGS: Small dorsal spurs from the distal talus and navicular. Calcaneal spur at the Achilles tendon insertion. Negative for fracture. Normal alignment. Mild diffuse osteopenia.  IMPRESSION: 1. Negative for fracture or other acute bone abnormality. 2. Osteopenia and chronic/degenerative changes as above   Electronically Signed   By: Arne Cleveland M.D.   On: 10/10/2014 19:12   Dg Ankle Complete Right  10/10/2014   CLINICAL DATA:  Full body pain, no known injury.Pt has pain and swelling on the posterior portion of both of her hands. Her head is "heavy" and tilts to the right, with extreme neck pain. Both ankles are in pain and are stiff.Hx of Stroke 1 week ago, left sided weakness as a result. Pt also has a Hx of Lupus and a Fx on her right foot as a result Chronic ankle pain  EXAM: RIGHT ANKLE - COMPLETE 3+ VIEW  COMPARISON:  09/04/2012  FINDINGS: There is no evidence of fracture, dislocation, or joint effusion. Interval fusion between the navicular and at least 1 of the cuneiforms. Small dorsal spurs from the distal talus and navicular. Small calcaneal spur at the Achilles tendon insertion. There is no evidence of other focal bone abnormality. Soft tissues are unremarkable.  IMPRESSION: 1. Negative for fracture  or other acute abnormality. 2. Interval midfoot fusion with small dorsal degenerative spur formation.   Electronically Signed   By: Arne Cleveland M.D.   On: 10/10/2014 19:14   Mr Brain Wo Contrast  10/11/2014   CLINICAL DATA:  Recent pontine infarct, residual RIGHT upper extremity weakness. Subsequent encounter.  EXAM: MRI HEAD WITHOUT CONTRAST  TECHNIQUE: Multiplanar, multiecho pulse sequences of the brain and surrounding structures were obtained without intravenous contrast.  COMPARISON:  MR brain 10/03/2014. CT head 10/02/2014. CT head 09/28/2014.  FINDINGS: Redemonstrated is a RIGHT paramedian pontine hemorrhage. There is both T1 and  T2 shortening consistent with a subacute time course. Mild surrounding edema is slightly diminishing. No new areas of hemorrhage.  Slight premature for age cerebral and cerebellar atrophy. Moderate T2 and FLAIR hyperintensity of the periventricular and subcortical white matter, also affecting the brainstem, consistent with chronic microvascular ischemic change.  Minor DWI susceptibility artifact related to falx calcification is redemonstrated in the LEFT frontal parasagittal cortex, and does not represent acute infarction.  Flow voids are maintained. Slight hypercellular marrow could relate to anemia.  IMPRESSION: Findings consistent with expected time course of the previously demonstrated brainstem hemorrhage, now subacute. Slight diminution surrounding edema.   Electronically Signed   By: Rolla Flatten M.D.   On: 10/11/2014 08:29   US Renal  10/10/2014   CLINICAL DATA:  Acute kidney injury  EXAM: RENAL/URINARY TRACT ULTRASOUND COMPLETE  COMPARISON:  CT abdomen 09/12/2009  FINDINGS: Right Kidney:  Length: 10.6 cm. Increased renal cortical echogenicity. No mass or hydronephrosis visualized. Mild fullness of the right renal collecting system.  Left Kidney:  Length: 11.2 cm. Increased renal cortical echogenicity. 2.7 x 2.2 x 2.6 cm anechoic left renal mass with increased through transmission most consistent with a cyst. No hydronephrosis visualized.  IMPRESSION: 1. Increased bilateral renal cortical echogenicity as can be seen with medical renal disease versus acute kidney injury. No obstructive uropathy.   Electronically Signed   By: Kathreen Devoid   On: 10/10/2014 17:42   Dg Chest Port 1 View  10/10/2014   CLINICAL DATA:  Sepsis, hypertension, shortness of Breath.  EXAM: PORTABLE CHEST - 1 VIEW  COMPARISON:  10/05/2014  FINDINGS: Stable moderate cardiomegaly. Slightly improved aeration with some residual subsegmental atelectasis in the lung bases. No definite effusion Visualized skeletal structures are  unremarkable.  IMPRESSION: 1. Stable cardiomegaly. 2. Improved aeration with minimal residual bibasilar subsegmental atelectasis.   Electronically Signed   By: Arne Cleveland M.D.   On: 10/10/2014 12:09   Dg Hand Complete Left  10/10/2014   CLINICAL DATA:  Full body pain, no known injury.Pt has pain and swelling on the posterior portion of both of her hands. Her head is "heavy" and tilts to the right, with extreme neck pain. Both ankles are in pain and are stiff.Hx of Stroke 1 week ago, left sided weakness as a result. Pt also has a Hx of Lupus and a Fx on her right foot as a result Chronic ankle pain  EXAM: LEFT HAND - COMPLETE 3+ VIEW  COMPARISON:  None.  FINDINGS: There is no evidence of fracture or dislocation. There is no evidence of arthropathy or other focal bone abnormality. No osseous erosions. Soft tissues are unremarkable.  IMPRESSION: Negative.   Electronically Signed   By: Arne Cleveland M.D.   On: 10/10/2014 19:17   Dg Hand Complete Right  10/10/2014   CLINICAL DATA:  Full body pain, no known injury.Pt has pain and swelling on the  posterior portion of both of her hands. Her head is "heavy" and tilts to the right, with extreme neck pain. Both ankles are in pain and are stiff.Hx of Stroke 1 week ago, left sided weakness as a result. Pt also has a Hx of Lupus and a Fx on her right foot as a result Chronic ankle pain  EXAM: RIGHT HAND - COMPLETE 3+ VIEW  COMPARISON:  None.  FINDINGS: There is no evidence of fracture or dislocation. There is no evidence of arthropathy or other focal bone abnormality. Soft tissues are unremarkable.  IMPRESSION: Negative.   Electronically Signed   By: Arne Cleveland M.D.   On: 10/10/2014 19:17    Assessment: Anemia with drop in hemoglobin in the face of multiple medical problems including lupus, no reported overt GI bleeding and stool heme negative. Plan:  As long as stools heme negative doubt significant GI bleeding, could be an autoimmune component to her  anemia. We'll continue to monitor stools and hemoglobin after transfusion. Reserve endoscopy for confirmed GI bleeding. Will follow with you Jaquese Irving C 10/11/2014, 8:33 AM

## 2014-10-11 NOTE — Evaluation (Signed)
Physical Therapy Evaluation Patient Details Name: [name] [name] MRN: [id number] DOB: 06/15/1961 Today's Date: 10/11/2014   History of Present Illness  53 y/o black female with complicated history with concern for sepsis and polyarthralgias/myalgias.  Recently was at [facility] following admission with acute pontine hemorrhagic infarct. PMH positive for hyperlipidemia, HTN and compliance issues.  Clinical Impression  Patient demonstrates deficits in functional mobility as indicated below. Will need continued skilled PT to address deficits and maximize function. Will see as indicated and progress as tolerated.    Follow Up Recommendations CIR    Equipment Recommendations  Rolling walker with 5" wheels    Recommendations for Other Services Rehab consult     Precautions / Restrictions Precautions Precautions: Fall Precaution Comments: swollen joints, increased pain      Mobility  Bed Mobility Overal bed mobility: Needs Assistance Bed Mobility: Supine to Sit     Supine to sit: Max assist;+2 for physical assistance;HOB elevated Sit to supine: Max assist;+2 for physical assistance   General bed mobility comments: Increased pain with mobility, difficulty tolerating movement, able to initate LE movement off EOB, assist to improve ROM in knee flexion upon sitting EOB  Transfers Overall transfer level: Needs assistance Equipment used: 2 person hand held assist Transfers: Sit to/from Stand Sit to Stand: Mod assist;+2 physical assistance         General transfer comment: mod assist +2 to power up to standing secondary to pain, patient able to tolerate static standing for 30 seconds but unable to tolerate further, assisted back to bed and positioned for comfort and edema control  Ambulation/Gait                Stairs            Wheelchair Mobility    Modified Rankin (Stroke Patients Only)       Balance   Sitting-balance support: Feet supported (passive ROM with  rhythmic motion to position [facility]) Sitting balance-Leahy Scale: Fair Sitting balance - Comments: improved midline position today     Standing balance-Leahy Scale: Poor                               Pertinent Vitals/Pain Pain Assessment: 0-10 Pain Score: 9  Pain Location: joints Pain Descriptors / Indicators: Sore Pain Intervention(s): Limited activity within patient's tolerance;Monitored during session;Repositioned    Home Living Family/patient expects to be discharged to:: Inpatient rehab Living Arrangements: Children Available Help at Discharge: Family;Available 24 hours/day Type of Home: House Home Access: Stairs to enter Entrance Stairs-Rails: Right Entrance Stairs-Number of Steps: 4 Home Layout: Two level;Able to live on main level with bedroom/bathroom Home Equipment: Walker - standard      Prior Function Level of Independence: Independent         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 Hobbies include spending time with family and going out to eat, play candy crush     Hand Dominance   Dominant Hand: Right    Extremity/Trunk Assessment   Upper Extremity Assessment: Defer to OT evaluation           Lower Extremity Assessment: RLE deficits/detail;LLE deficits/detail (limited ROM secondary to pain and swelling of joints)         Communication   Communication: No difficulties (needed soft touch call bell)  Cognition Arousal/Alertness: Awake/alert Behavior During Therapy: Flat affect Overall Cognitive Status: Impaired/Different from baseline     Current Attention Level: Selective  Safety/Judgement: Decreased awareness of safety;Decreased awareness of deficits Awareness: Emergent Problem Solving: Slow processing      General Comments      Exercises  PROM BLEs during EOB       Assessment/Plan    PT Assessment Patient needs continued PT services  PT Diagnosis Abnormality of gait;Hemiplegia non-dominant side   PT Problem List  Decreased strength;Decreased balance;Decreased mobility;Decreased coordination;Decreased knowledge of use of DME;Decreased safety awareness;Impaired tone  PT Treatment Interventions DME instruction;Gait training;Balance training;Neuromuscular re-education;Stair training;Functional mobility training;Patient/family education;Therapeutic activities;Therapeutic exercise   PT Goals (Current goals can be found in the Care Plan section) Acute Rehab PT Goals Patient Stated Goal: To return to independent PT Goal Formulation: With patient Time For Goal Achievement: 10/27/14 Potential to Achieve Goals: Good    Frequency Min 3X/week   Barriers to discharge Inaccessible home environment;Decreased caregiver support      Co-evaluation               End of Session     Patient left: in bed;with call bell/phone within reach (soft touch call bell set up)           Time: 0950-1004 PT Time Calculation (min) (ACUTE ONLY): 14 min   Charges:   PT Evaluation $Initial PT Evaluation Tier I: 1 Procedure PT Treatments $Therapeutic Activity: 8-22 mins   PT G CodesDuncan Dull 10/11/2014, 3:45 PM Alben Deeds, [place] DPT  [phone]

## 2014-10-12 DIAGNOSIS — I429 Cardiomyopathy, unspecified: Secondary | ICD-10-CM

## 2014-10-12 DIAGNOSIS — E871 Hypo-osmolality and hyponatremia: Secondary | ICD-10-CM | POA: Insufficient documentation

## 2014-10-12 DIAGNOSIS — Z9114 Patient's other noncompliance with medication regimen: Secondary | ICD-10-CM | POA: Insufficient documentation

## 2014-10-12 DIAGNOSIS — D62 Acute posthemorrhagic anemia: Secondary | ICD-10-CM | POA: Insufficient documentation

## 2014-10-12 DIAGNOSIS — M329 Systemic lupus erythematosus, unspecified: Secondary | ICD-10-CM

## 2014-10-12 DIAGNOSIS — Z720 Tobacco use: Secondary | ICD-10-CM

## 2014-10-12 DIAGNOSIS — M351 Other overlap syndromes: Secondary | ICD-10-CM | POA: Insufficient documentation

## 2014-10-12 DIAGNOSIS — I613 Nontraumatic intracerebral hemorrhage in brain stem: Secondary | ICD-10-CM

## 2014-10-12 DIAGNOSIS — R739 Hyperglycemia, unspecified: Secondary | ICD-10-CM

## 2014-10-12 DIAGNOSIS — N179 Acute kidney failure, unspecified: Secondary | ICD-10-CM | POA: Insufficient documentation

## 2014-10-12 DIAGNOSIS — N189 Chronic kidney disease, unspecified: Secondary | ICD-10-CM

## 2014-10-12 DIAGNOSIS — M358 Other specified systemic involvement of connective tissue: Secondary | ICD-10-CM

## 2014-10-12 LAB — LIPID PANEL
CHOL/HDL RATIO: 6.5 ratio
Cholesterol: 78 mg/dL (ref 0–200)
HDL: 12 mg/dL — AB (ref >39)
LDL CALC: 48 mg/dL (ref 0–99)
Triglycerides: 92 mg/dL (ref <150)
VLDL: 18 mg/dL (ref 0–40)

## 2014-10-12 LAB — COMPREHENSIVE METABOLIC PANEL
ALT: 21 U/L (ref 0–35)
AST: 27 U/L (ref 0–37)
Albumin: 1.6 g/dL — ABNORMAL LOW (ref 3.5–5.2)
Alkaline Phosphatase: 145 U/L — ABNORMAL HIGH (ref 39–117)
Anion gap: 19 — ABNORMAL HIGH (ref 5–15)
BILIRUBIN TOTAL: 0.5 mg/dL (ref 0.3–1.2)
BUN: 77 mg/dL — ABNORMAL HIGH (ref 6–23)
CALCIUM: 9.1 mg/dL (ref 8.4–10.5)
CO2: 19 mEq/L (ref 19–32)
Chloride: 93 mEq/L — ABNORMAL LOW (ref 96–112)
Creatinine, Ser: 4.19 mg/dL — ABNORMAL HIGH (ref 0.50–1.10)
GFR calc non Af Amer: 11 mL/min — ABNORMAL LOW (ref >90)
GFR, EST AFRICAN AMERICAN: 13 mL/min — AB (ref >90)
GLUCOSE: 236 mg/dL — AB (ref 70–99)
POTASSIUM: 4.5 meq/L (ref 3.7–5.3)
Sodium: 131 mEq/L — ABNORMAL LOW (ref 137–147)
Total Protein: 6.8 g/dL (ref 6.0–8.3)

## 2014-10-12 LAB — CBC
HCT: 25.1 % — ABNORMAL LOW (ref 36.0–46.0)
Hemoglobin: 8.8 g/dL — ABNORMAL LOW (ref 12.0–15.0)
MCH: 27.6 pg (ref 26.0–34.0)
MCHC: 35.1 g/dL (ref 30.0–36.0)
MCV: 78.7 fL (ref 78.0–100.0)
PLATELETS: 231 10*3/uL (ref 150–400)
RBC: 3.19 MIL/uL — ABNORMAL LOW (ref 3.87–5.11)
RDW: 13.7 % (ref 11.5–15.5)
WBC: 19.9 10*3/uL — AB (ref 4.0–10.5)

## 2014-10-12 LAB — GLUCOSE, CAPILLARY
GLUCOSE-CAPILLARY: 246 mg/dL — AB (ref 70–99)
Glucose-Capillary: 244 mg/dL — ABNORMAL HIGH (ref 70–99)
Glucose-Capillary: 228 mg/dL — ABNORMAL HIGH (ref 70–99)

## 2014-10-12 LAB — MAGNESIUM: MAGNESIUM: 1.8 mg/dL (ref 1.5–2.5)

## 2014-10-12 LAB — ANTI-SMITH ANTIBODY: ENA SM AB SER-ACNC: POSITIVE

## 2014-10-12 LAB — HEMOGLOBIN A1C
Hgb A1c MFr Bld: 5.2 % (ref <5.7)
Mean Plasma Glucose: 103 mg/dL (ref <117)

## 2014-10-12 MED ORDER — CARVEDILOL 12.5 MG PO TABS
12.5000 mg | ORAL_TABLET | Freq: Two times a day (BID) | ORAL | Status: DC
  Filled 2014-10-12 (×2): qty 1

## 2014-10-12 MED ORDER — CARVEDILOL 25 MG PO TABS: 25.0000 mg | ORAL_TABLET | Freq: Two times a day (BID) | ORAL | Status: DC

## 2014-10-12 MED ORDER — CLONIDINE HCL 0.1 MG PO TABS
0.1000 mg | ORAL_TABLET | Freq: Two times a day (BID) | ORAL | Status: DC
  Administered 2014-10-12 – 2014-10-13 (×3): 0.1 mg via ORAL
  Filled 2014-10-12 (×4): qty 1

## 2014-10-12 MED ORDER — INSULIN ASPART 100 UNIT/ML ~~LOC~~ SOLN
0.0000 [IU] | SUBCUTANEOUS | Status: DC
  Administered 2014-10-12 (×2): 3 [IU] via SUBCUTANEOUS

## 2014-10-12 MED ORDER — HYDROXYCHLOROQUINE SULFATE 200 MG PO TABS
400.0000 mg | ORAL_TABLET | Freq: Every day | ORAL | Status: DC
  Administered 2014-10-12 – 2014-10-17 (×6): 400 mg via ORAL
  Filled 2014-10-12 (×6): qty 2

## 2014-10-12 MED ORDER — CARVEDILOL 12.5 MG PO TABS
12.5000 mg | ORAL_TABLET | Freq: Two times a day (BID) | ORAL | Status: DC
Start: 2014-10-12 — End: 2014-10-14
  Administered 2014-10-12 – 2014-10-13 (×4): 12.5 mg via ORAL
  Filled 2014-10-12 (×6): qty 1

## 2014-10-12 MED ORDER — VANCOMYCIN HCL IN DEXTROSE 1-5 GM/200ML-% IV SOLN
1000.0000 mg | INTRAVENOUS | Status: DC
  Administered 2014-10-12: 1000 mg via INTRAVENOUS
  Filled 2014-10-12: qty 200

## 2014-10-12 MED ORDER — INSULIN ASPART 100 UNIT/ML ~~LOC~~ SOLN
0.0000 [IU] | SUBCUTANEOUS | Status: DC
  Administered 2014-10-12: 5 [IU] via SUBCUTANEOUS
  Administered 2014-10-13: 3 [IU] via SUBCUTANEOUS
  Administered 2014-10-13 (×2): 5 [IU] via SUBCUTANEOUS
  Administered 2014-10-13: 3 [IU] via SUBCUTANEOUS

## 2014-10-12 NOTE — Progress Notes (Signed)
Orthopaedic Trauma [facility] Progress Note  Subjective  Doing better after starting prednisone Soreness/myalgias/arthralgias decreased Waiting to start therapy Understands she needs to followup with Rheum as an outpatient  Review of Systems  Constitutional: Negative for fever.  Respiratory: Negative for shortness of breath and wheezing.   Cardiovascular: Negative for chest pain and palpitations.  Musculoskeletal:       Decreased muscle and joint pain      Objective   BP 165/80 mmHg  Pulse 88  Temp(Src) 97.5 F (36.4 C) (Oral)  Resp 17  Ht 5' 5.5" (1.664 m)  Wt 70.6 kg (155 lb 10.3 oz)  BMI 25.50 kg/m2  SpO2 94%  LMP 07/18/2012  Intake/Output      12/09 0701 - 12/10 0700 12/10 0701 - 12/11 0700   P.O. 240    I.V. (mL/kg) 690 (9.8) 739.3 (10.5)   Blood     IV Piggyback     Total Intake(mL/kg) 930 (13.2) 739.3 (10.5)   Urine (mL/kg/hr) 150 (0.1)    Total Output 150     Net +780 +739.3        Stool Occurrence 1 x      Labs Results for [facility] (MRN [id number]) as of 10/12/2014 12:52  Ref. Range 10/10/2014 19:00  Vit D, 25-Hydroxy Latest Range: 30-100 ng/mL 18 (L)   Results for [facility] (MRN [id number]) as of 10/12/2014 12:52  Ref. Range 10/10/2014 19:31  PTH Latest Range: 14-64 pg/mL 185 (H)   Results for [facility] (MRN [id number]) as of 10/12/2014 12:52  Ref. Range 10/10/2014 19:00  CRP Latest Range: <0.60 mg/dL 29.4 (H)   Results for [facility] (MRN [id number]) as of 10/12/2014 12:52  Ref. Range 10/10/2014 19:00  Sed Rate Latest Range: 0-22 mm/hr >140 (H)   Results for [facility] (MRN [id number]) as of 10/12/2014 12:52  Ref. Range 10/10/2014 19:00  ANA Ser Ql Latest Range: NEGATIVE  POSITIVE (A)  ANA Pattern 1 No range found SPECKLED  ANA Titer 1 Latest Range: <1:40  6:3149 (H)  Cyclic Citrullin Peptide Ab Latest Range: 0.0-5.0 U/mL 16.8 (H)  ds DNA Ab No range found 1  Rhuematoid fact SerPl-aCnc Latest Range: <=14 IU/mL 66 (H)   C3 Complement Latest Range: 90-180 mg/dL 153  Complement C4, Body Fluid Latest Range: 10-40 mg/dL 37   Results for [facility], Kellyn A (MRN [id number]) as of 10/12/2014 12:52  Ref. Range 10/10/2014 19:31  Calcium Ionized Latest Range: 1.12-1.23 mmol/L 1.28 (H)    Histone antibodies, IgG, blood  Status: Final result     Visible to patient:  Not Released     Next appt: None              Ref Range 2d ago     DNA-Histone <1.0 NEG AI  >8.0 POS    Comments: Performed at [facility]     Specimen Collected: 10/10/14 7:00 PM Last Resulted: 10/11/14 1:25 PM      Exam  Gen: resting comfortably in bed, NAD, more engaging today  Ext:  Decreased tenderness throughout  AROM improved   Assessment and Plan   POD/HD#: 2  Labs and responsiveness to prednisone suggest rheumatologic condition (SLE +/- RA) Will need outpatient follow up with Rheum 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  Pt with hyperparathyroidism as well, ? If related to CKD   No acute ortho issues at this time Will sign off  Please call if there are questions   Jari Pigg, PA-C [facility] [phone] (O) 10/12/2014 12:51 PM

## 2014-10-12 NOTE — Progress Notes (Signed)
CRITICAL VALUE ALERT  Critical value received:  Anaerobic gram positive rod blood culture  Date of notification:  10/12/14  Time of notification:  7:30 PM  Critical value read back: Yes  Nurse who received alert:  Alessandra Grout  MD notified (1st page):  Fredirick Maudlin  Time of first page:  0730 PM  MD notified (2nd page):  Time of second page:  Responding MD:    Time MD responded:  [phone]

## 2014-10-12 NOTE — Progress Notes (Signed)
Speech Language Pathology Treatment: Dysphagia  Patient Details Name: SAKINAH ROSAMOND MRN: [id number] DOB: 04-21-1961 Today's Date: 10/12/2014 Time: 5872-7618 SLP Time Calculation (min) (ACUTE ONLY): 17 min  Assessment / Plan / Recommendation Clinical Impression  Pt fully alert, able to masticate solids without difficulty. Pt required min verbal cues to state and follow basic aspiration precautions and throat clear strategy. Even with large sips, no evidence of aspiration seen. Reviewed findings of FEES, precautions with pt and family. Visual reminder placed. Will upgrade diet to regular thin, No SLP f/u for dysphagia needed. May need f/u for cognition at next level of care.    HPI     Pertinent Vitals    SLP Plan  All goals met    Recommendations Diet recommendations: Regular;Thin liquid Liquids provided via: Cup;No straw Medication Administration: Whole meds with liquid Supervision: Intermittent supervision to cue for compensatory strategies Compensations: Slow rate;Small sips/bites;Clear throat intermittently Postural Changes and/or Swallow Maneuvers: Upright 30-60 min after meal;Seated upright 90 degrees              Oral Care Recommendations: Oral care BID Plan: All goals met    GO    Herbie Baltimore, MA [name] [phone]  Lynann Beaver 10/12/2014, 1:54 PM

## 2014-10-12 NOTE — Progress Notes (Signed)
ANTIBIOTIC CONSULT NOTE - INITIAL  Pharmacy Consult for Restart vancomycin Indication: bacteremia  Allergies  Allergen Reactions  . Sulfa Antibiotics Itching    Patient Measurements: Height: 5' 5.5" (166.4 cm) Weight: 155 lb 10.3 oz (70.6 kg) IBW/kg (Calculated) : 58.15 Vital Signs: Temp: 97.6 F (36.4 C) (12/10 1620) Temp Source: Oral (12/10 1620) BP: 161/84 mmHg (12/10 1800) Pulse Rate: 64 (12/10 1800) Intake/Output from previous day: 12/09 0701 - 12/10 0700 In: 930 [P.O.:240; I.V.:690] Out: 150 [Urine:150] Intake/Output from this shift:    Labs:  Recent Labs  10/10/14 0726 10/10/14 1629 10/10/14 1633 10/11/14 0417 10/12/14 0251  WBC 21.9*  --   --  21.9* 19.9*  HGB 6.1*  --   --  8.9* 8.8*  PLT 232  --   --  217 231  LABCREA  --  114.32 114.51  --   --   CREATININE 4.45*  --   --  4.48* 4.19*   Estimated Creatinine Clearance: 15.7 mL/min (by C-G formula based on Cr of 4.19). No results for input(s): VANCOTROUGH, VANCOPEAK, VANCORANDOM, GENTTROUGH, GENTPEAK, GENTRANDOM, TOBRATROUGH, TOBRAPEAK, TOBRARND, AMIKACINPEAK, AMIKACINTROU, AMIKACIN in the last 72 hours.   Microbiology: Recent Results (from the past 720 hour(s))  MRSA PCR Screening     Status: None   Collection Time: 09/28/14 11:31 PM  Result Value Ref Range Status   MRSA by PCR NEGATIVE NEGATIVE Final    Comment:        The GeneXpert MRSA Assay (FDA approved for NASAL specimens only), is one component of a comprehensive MRSA colonization surveillance program. It is not intended to diagnose MRSA infection nor to guide or monitor treatment for MRSA infections.   Urine culture     Status: None   Collection Time: 10/05/14  7:03 PM  Result Value Ref Range Status   Specimen Description URINE, CLEAN CATCH  Final   Special Requests NONE  Final   Culture  Setup Time   Final    10/05/2014 23:02 Performed at [facility]   Final    30,000 COLONIES/ML Performed at [facility]    Culture   Final    Multiple bacterial morphotypes present, none predominant. Suggest appropriate recollection if clinically indicated. Performed at [facility]    Report Status 10/06/2014 FINAL  Final  Culture, blood (routine x 2)     Status: None (Preliminary result)   Collection Time: 10/10/14  9:38 AM  Result Value Ref Range Status   Specimen Description BLOOD LEFT FOREARM  Final   Special Requests BOTTLES DRAWN AEROBIC ONLY 5CC  Final   Culture  Setup Time   Final    10/10/2014 14:39 Performed at [facility]    Culture   Final           BLOOD CULTURE RECEIVED NO GROWTH TO DATE CULTURE WILL BE HELD FOR 5 DAYS BEFORE ISSUING A FINAL NEGATIVE REPORT Performed at [facility]    Report Status PENDING  Incomplete  Culture, blood (routine x 2)     Status: None (Preliminary result)   Collection Time: 10/10/14  9:44 AM  Result Value Ref Range Status   Specimen Description BLOOD LEFT ARM  Final   Special Requests   Final    BOTTLES DRAWN AEROBIC AND ANAEROBIC 5CC BLUE  4CC RED   Culture  Setup Time   Final    10/10/2014 14:39 Performed at [facility]  Final    GRAM POSITIVE RODS        BLOOD CULTURE RECEIVED NO GROWTH TO DATE CULTURE WILL BE HELD FOR 5 DAYS BEFORE ISSUING A FINAL NEGATIVE REPORT Note: Gram Stain Report Called to,Read Back By and Verified With: Alessandra Grout RN 8P Performed at [facility]    Report Status PENDING  Incomplete  MRSA PCR Screening     Status: None   Collection Time: 10/10/14 10:48 AM  Result Value Ref Range Status   MRSA by PCR NEGATIVE NEGATIVE Final    Comment:        The GeneXpert MRSA Assay (FDA approved for NASAL specimens only), is one component of a comprehensive MRSA colonization surveillance program. It is not intended to diagnose MRSA infection nor to guide or monitor treatment for MRSA infections.     Medical History: Past Medical History  Diagnosis Date   . Hypertension   . Lupus   . CKD (chronic kidney disease)     due to lupus/Dr. [name]  . Diabetes mellitus   . Stenosis of cervical spine region     with HNP at C5/6, C6/7  . Metatarsal bone fracture right    4th  . 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 Arthritis     RA  . Anemia   . Stroke    Assessment: 53 year old female s/p vancomycin and zosyn from 12/8 to 12/9 for rule out sepsis now to resume vancomycin with lab report of anaerobic GPR in blood culture. WBC 19.9 (down). Afebrile. Patient has acute on chronic kidney disease- SCr up to 4.19, slow improvement (baseline 2-3s) - no dialysis per nephrology at this time. Estimated CrCl ~15 mL/min. Last vancomycin dose was on 12/8 at 12:40PM x1 dose. Current UOP recorded as 0.1-0.2 cc/kg/hr.   Goal of Therapy:  Vancomycin trough level 15-20 mcg/ml  Plan:  Restart Vancomycin 1g IV q48h.  May need to follow based on levels with changing renal function.  Follow-up ability to narrow therapy based on culture results.   Sloan Leiter, PharmD, BCPS Clinical Pharmacist [phone] 10/12/2014,7:40 PM

## 2014-10-12 NOTE — Progress Notes (Signed)
[facility] TEAM 1 - Stepdown/ICU TEAM Progress Note  [name] [name] DQQ:[id number] DOB: November 10, 1960 DOA: 10/10/2014 PCP: No primary care provider on file.  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 Workup showed nonsignificant carotid stenosis and 2-D echo noted severe LVH. During that hospitalization she developed mild pulmonary edema which improved with diuresis. Follow up head CT and MRI brain showed improvement in the size of hemorrhage. Patient was discharged to inpatient rehabilitation for ongoing physical therapy on 12/3.   Patient reported she had not been participating in rehabilitation much due to worsening pain in her knees and ankles. Blood work revealed acute leukocytosis and progressive drop in H&H (11>9>8>6.1). Stool for occult blood done was negative. Chemistries noted anion gap metabolic acidosis with hyperkalemia and worsening renal function. Reportedly patient had poor by mouth intake. Patient had been reporting pain in her neck, worsened wrists, elbows, bilateral knee and ankle pain.   HPI/Subjective: A/O 4, NAD, states joint pain has decreased significantly, negative CP, negative SOB. States has not been compliant with her home medication.  Assessment/Plan: SIRS -No evidence of active infection, most c/w connective tissue disease flair  - SIRS physiology improving, stop abx and follow  Acute anemia -progressive drop in her Hgb from 11.7 to 6.1 - stool occult blood negative  - transfused 2 units PRBC - Hgb improved accordingly  -Hemoglobin Stable - Fe studies c/w ACD  Right pontine hemorrhage with acute encephalopathy and ? New RUE weakness -acute encephalopathy resolved, right upper extremity weakness  resolved.  -follow-up MRI brain w/o acute findings - Consult PT/OT  CIR vs SNF vs HH   Acute renal failure superimposed on stage 4 chronic kidney disease -acute worsening of her underlying chronic kidney disease - baseline appears to be 3.2-3.5  - Lasix and ACE inhibitor discontinued  - Hold CellCept in the face of acute renal failure  - Nephrology following  - no obstruction on renal US  Hyperkalemia -Resolved  -Continue sodium bicarbonate PO 1300 mg TID  Hyponatremia -Most likely secondary to cardiomyopathy, and acute renal failure   Hyperglycemia -Drug-induced? 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 -Will need rheumatology referral upon discharge  Rheumatoid arthritis  -Followed with Dr. [name] as outpt until one year ago - on CellCept -Hold CellCept secondary to acute renal failure (can cause/exacerbate) Patient both RF and CCP positive which is a bad prognostic sign. Patients with positive RF+ CCP positive will usually have a more severe disease process which progresses more rapidly. -Continue prednisone 60 mg daily -Patient needs to be on DMARD. Considering patient's renal function would start on Plaquenil 400 mg daily -On discharge patient will need ophthalmology appointment to obtain baseline  Acute polyarthritis - Progressive diffuse polyarthritis Most c/w flair of rheumatoid arthritis + SLE  -See rheumatoid arthritis   Essential hypertension -Hydralazine DC'd secondary to possible use lupus. BP still uncontrolled  -Do not use ACE inhibitors or ARB's secondary to acute on chronic renal failure -Continue amlodipine 10 mg daily  -Coreg to 12.5 mg BID -Clonidine 0.1mg  BID   Cardiomyopathy -See hypertension  Noncompliance with medication -Counseled patient and family on sequela of noncompliance to include ESRD on  HD,  additional hemorrhagic stroke, and death  Tobacco abuse -Counseled on sequela of continued tobacco abuse to include increased risk of stroke, MI, death    Code Status: FULL Family Communication: family present at time of exam Disposition Plan: Resolution acute on chronic renal failure, MCTD flair    Consultants: Dr. Ainsley Spinner (orthopedics) Dr. Allison Quarry (nephrology) Dr. Teena Irani [facility]) Dr. Dorian Pod (neurology)     Procedure/Significant Events: 12/2 echocardiogram; Left ventricle: severe LVH. -LVEF  65%- 70%. -No cardiac source of embolism  12/8 renal ultrasound;- Increased bilateral renal cortical echogenicity seen with medical renal disease vs acute kidney injury.- No obstructive uropathy 12/8 PCXR: Stable cardiomegaly.Improved aeration with minimal residual bibasilar subsegmental Atelectasis 12/8 EEG;normal awake and drowsy EEG 12/9 MRI brain without contrast;Findings consistent with expected time course previously demonstrated brainstem hemorrhage, now subacute. Slight diminution surrounding edema.     Culture 12/8 blood left forearm/arm NGTD 12/8 MRSA by PCR negative   Antibiotics: NA  DVT prophylaxis: SCD   Devices NA   LINES / TUBES:      Continuous Infusions: . dextrose 5 % 1,000 mL with sodium bicarbonate 150 mEq infusion 75 mL/hr at 10/12/14 0800    Objective: VITAL SIGNS: Temp: 97.5 F (36.4 C) (12/10 0752) Temp Source: Oral (12/10 0752) BP: 160/72 mmHg (12/10 0752) Pulse Rate: 87 (12/10 0752) SPO2; FIO2:   Intake/Output Summary (Last 24 hours) at 10/12/14 1051 Last data filed at 10/12/14 0800  Gross per 24 hour  Intake 1666.25 ml  Output    150 ml  Net 1516.25 ml     Exam: General: A/O 4, NAD, No acute respiratory distress Lungs: Clear to auscultation bilaterally without wheezes or crackles Cardiovascular: Tachycardic, Regular rhythm without murmur gallop or rub normal S1 and S2 Abdomen: Nontender,  nondistended, soft, bowel sounds positive, no rebound, no ascites, no appreciable mass Extremities: No significant cyanosis, clubbing, multiple joints tender to palpation left ankle/knee > right ankle/knee, positive increased pain with gripping objects bilateral Lt>Rt  Neurologic; cranial nerves II through XII intact, tongue/uvula midline, strength all extremities 5/5, sensation intact, did not ambulate patient.  Data Reviewed: Basic Metabolic Panel:  Recent Labs Lab 10/06/14 0611 10/10/14 0726 10/10/14 1931 10/11/14 0417 10/12/14 0251  NA 139 132*  --  131* 131*  K 4.3 5.5*  --  5.6* 4.5  CL 108 98  --  97 93*  CO2 15* 14*  --  12* 19  GLUCOSE 88 105*  --  105* 236*  BUN 49* 74*  --  76* 77*  CREATININE 3.04* 4.45*  --  4.48* 4.19*  CALCIUM 9.1 9.4 8.9 9.2 9.1   Liver Function Tests:  Recent Labs Lab 10/06/14 0611 10/10/14 0726 10/11/14 0417 10/12/14 0251  AST 11 31 26 27   ALT 7 23 22 21   ALKPHOS 85 111 142* 145*  BILITOT 0.3 0.6 0.7 0.5  PROT 6.8 7.1 6.8 6.8  ALBUMIN 1.8* 1.8* 1.6* 1.6*   No results for input(s): LIPASE, AMYLASE in the last 168 hours. No results for input(s): AMMONIA in the last 168 hours. CBC:  Recent Labs Lab 10/06/14 0611 10/07/14 1118 10/10/14 0439 10/10/14 0726 10/11/14 0417 10/12/14 0251  WBC 8.1  --  20.1* 21.9* 21.9* 19.9*  NEUTROABS 6.6  --   --  19.9*  --   --   HGB 8.2* 8.1* 6.2* 6.1* 8.9* 8.8*  HCT 25.0* 23.9* 18.3* 18.9* 26.0* 25.1*  MCV 83.3  --  82.1 80.4 81.8 78.7  PLT 216  --  218 232 217 231   Cardiac Enzymes:  Recent Labs Lab 10/10/14 1900  CKTOTAL 244*   BNP (last 3 results)  Recent Labs  10/05/14 0521  PROBNP 62694.8*   CBG:  Recent Labs Lab 10/05/14 1059  GLUCAP 146*    Recent Results (from the past 240 hour(s))  Urine culture     Status: None   Collection Time: 10/05/14  7:03 PM  Result Value Ref Range Status   Specimen Description URINE, CLEAN CATCH  Final   Special Requests NONE  Final    Culture  Setup Time   Final    10/05/2014 23:02 Performed at [facility]   Final    30,000 COLONIES/ML Performed at [facility]    Culture   Final    Multiple bacterial morphotypes present, none predominant. Suggest appropriate recollection if clinically indicated. Performed at [facility]    Report Status 10/06/2014 FINAL  Final  Culture, blood (routine x 2)     Status: None (Preliminary result)   Collection Time: 10/10/14  9:38 AM  Result Value Ref Range Status   Specimen Description BLOOD LEFT FOREARM  Final   Special Requests BOTTLES DRAWN AEROBIC ONLY 5CC  Final   Culture  Setup Time   Final    10/10/2014 14:39 Performed at [facility]    Culture   Final           BLOOD CULTURE RECEIVED NO GROWTH TO DATE CULTURE WILL BE HELD FOR 5 DAYS BEFORE ISSUING A FINAL NEGATIVE REPORT Performed at [facility]    Report Status PENDING  Incomplete  Culture, blood (routine x 2)     Status: None (Preliminary result)   Collection Time: 10/10/14  9:44 AM  Result Value Ref Range Status   Specimen Description BLOOD LEFT ARM  Final   Special Requests   Final    BOTTLES DRAWN AEROBIC AND ANAEROBIC 5CC BLUE  4CC RED   Culture  Setup Time   Final    10/10/2014 14:39 Performed at [facility]    Culture   Final           BLOOD CULTURE RECEIVED NO GROWTH TO DATE CULTURE WILL BE HELD FOR 5 DAYS BEFORE ISSUING A FINAL NEGATIVE REPORT Performed at [facility]    Report Status PENDING  Incomplete  MRSA PCR Screening     Status: None   Collection Time: 10/10/14 10:48 AM  Result Value Ref Range Status   MRSA by PCR NEGATIVE NEGATIVE Final    Comment:        The GeneXpert MRSA Assay (FDA approved for NASAL specimens only), is one component of a comprehensive MRSA colonization surveillance program. It is not intended to diagnose MRSA infection nor to guide or monitor treatment for MRSA infections.       Studies:  Recent x-ray studies have been reviewed in detail by the Attending Physician  Scheduled Meds:  Scheduled Meds: . amLODipine  10 mg Oral Daily  . antiseptic oral rinse  7 mL Mouth Rinse BID  . carvedilol  12.5 mg Oral BID WC  . ferrous sulfate  325 mg Oral Q breakfast  . multivitamin  1 tablet Oral Daily  . mycophenolate  1,000 mg Oral BID  . nicotine  14 mg Transdermal Q24H  . pantoprazole  40 mg Oral BID  . predniSONE  60 mg Oral Q breakfast  .  sodium bicarbonate  1,300 mg Oral TID  . sodium chloride  3 mL Intravenous Q12H  . Vitamin D (Ergocalciferol)  50,000 Units Oral Q Wed    Time spent on care of this patient: 40 mins   Allie Bossier , MD   [facility] Office  [phone] Pager [phone]  On-Call/Text Page:      [email or web address]      password [email or web address]  If 7PM-7AM, please contact night-coverage [email or web address] Password [email or web address] 10/12/2014, 10:51 AM   LOS: 2 days

## 2014-10-12 NOTE — Progress Notes (Signed)
Admit: 10/10/2014 LOS: 2  52F with CKD, BL SCr high 2s/low 3s, originally admitted with small ICH, transferred from [facility] 10/10/14 with AoCKD, Hyperkalemia (On ACEi/MRB), persistent metabolic acidosis. Has arthralgias, leukocytosis, F/C. Hx/o SLE w/ unclear course.   Subjective:  Started 60 prednisone yesterday Feels improved today, less achy, less joint paints + RF, CCP, Anti Histone Ab, ANA 1:1280 Plenty of urine in foley bag, ? Not recording Kidney function improved  12/09 0701 - 12/10 0700 In: 930 [P.O.:240; I.V.:690] Out: 150 [Urine:150]  Filed Weights   10/10/14 1049 10/11/14 0006 10/12/14 0000  Weight: 66.679 kg (147 lb) 65.8 kg (145 lb 1 oz) 70.6 kg (155 lb 10.3 oz)    Scheduled Meds: . amLODipine  10 mg Oral Daily  . antiseptic oral rinse  7 mL Mouth Rinse BID  . ferrous sulfate  325 mg Oral Q breakfast  . labetalol  100 mg Oral BID  . multivitamin  1 tablet Oral Daily  . mycophenolate  1,000 mg Oral BID  . nicotine  14 mg Transdermal Q24H  . pantoprazole  40 mg Oral BID  . predniSONE  60 mg Oral Q breakfast  . sodium bicarbonate  1,300 mg Oral TID  . sodium chloride  3 mL Intravenous Q12H  . Vitamin D (Ergocalciferol)  50,000 Units Oral Q Wed   Continuous Infusions: . dextrose 5 % 1,000 mL with sodium bicarbonate 150 mEq infusion 75 mL/hr at 10/12/14 0800   PRN Meds:.acetaminophen **OR** acetaminophen, camphor-menthol, cyclobenzaprine, ondansetron **OR** ondansetron (ZOFRAN) IV  Current Labs: reviewed    Physical Exam:  Blood pressure 160/72, pulse 87, temperature 97.5 F (36.4 C), temperature source Oral, resp. rate 17, height 5' 5.5" (1.664 m), weight 70.6 kg (155 lb 10.3 oz), last menstrual period 07/18/2012, SpO2 94 %. GEN: appears unwell, uncomfortable ENT: NCAT EYES: EOMI CV: RRR, no rub, nl s1s2 PULM: clear b/l ABD: s/nt/nd MSK: b/l MCP, PIP effusions, erythrema, tenderness. B/l Knee tenderness. Metatarsal squeeze test + b/l SKIN: no  rashes/lesiosn EXT:no LEE NEURO: AAOx3  A/P 1. AoCKD; improving 1. Hx/o Membranous 2007 2. BL SCr high 2s, low 3s 3. Renal US 10/10/14 w/o obstruction 4. Suspect recent worsening is hypovolemic / hemodynamic 5. Cont to hold furosemide, spionolactone, lisinopril 6. Daily renal panel 7. Strict I/Os 8. Will follow along, no RRT indicated 9. Foley in place  2. Hyperkalemia, resolved 1. Holding meds as above 2. Would not treat hyperkalemia unless > 6 3. Metabolic Acidosis, AG about 24 1. Add NaHCO3 1300mg  PO TID today 2. Monitor serum Na with slightly hypotonic fluids 4. Hx/o Membranous GN 1. ACEi held 2. UP/C 1.88 on 10/10/14 5. SLE 1. On MMF 1000 BID, not sure how adherent as outpt 2. As per #6 6. Arthralgias, Polyarticular with swelling, erythema, tenderness 1. Seems to have responded to prednisone 2. +RF, CCP, Histone Ab.   1. ? Hydralazine induced lupus; would hold all further hydralazine 2. No other clear Drug induced lupus meds 3. ? Contribution of rheumatoid arthritis 3. Not sure how much MMF is helping here 4. Will need rheum eval 7. Low Grade Fever / Leukocytosis  1. Pt pan cultured 12/8; NGTD 2. Could be 2/2 #6 3. Off ABX 8. Recent Pontine ICH 9. HTN, need tighter control 2/2 recent CVA/ICH 1. Amlodipine 10, Labetalol 100 BID 2. HR ok 3. Change to coreg 12.5 BID, stop labetalol 4. No hyralazine 5. ACEi contributed to hyperkalemia; can consider in future 10. Anemia, borderline microcytic 1. 2u PRBC 10/10/14,  now Hb stable 2. FOBT neg 3. GI following,  Pearson Grippe MD 10/12/2014, 9:10 AM   Recent Labs Lab 10/10/14 0726 10/10/14 1931 10/11/14 0417 10/12/14 0251  NA 132*  --  131* 131*  K 5.5*  --  5.6* 4.5  CL 98  --  97 93*  CO2 14*  --  12* 19  GLUCOSE 105*  --  105* 236*  BUN 74*  --  76* 77*  CREATININE 4.45*  --  4.48* 4.19*  CALCIUM 9.4 8.9 9.2 9.1    Recent Labs Lab 10/06/14 0611  10/10/14 0726 10/11/14 0417 10/12/14 0251   WBC 8.1  < > 21.9* 21.9* 19.9*  NEUTROABS 6.6  --  19.9*  --   --   HGB 8.2*  < > 6.1* 8.9* 8.8*  HCT 25.0*  < > 18.9* 26.0* 25.1*  MCV 83.3  < > 80.4 81.8 78.7  PLT 216  < > 232 217 231  < > = values in this interval not displayed.

## 2014-10-13 LAB — GLUCOSE, CAPILLARY
GLUCOSE-CAPILLARY: 193 mg/dL — AB (ref 70–99)
GLUCOSE-CAPILLARY: 229 mg/dL — AB (ref 70–99)
GLUCOSE-CAPILLARY: 241 mg/dL — AB (ref 70–99)
Glucose-Capillary: 196 mg/dL — ABNORMAL HIGH (ref 70–99)
Glucose-Capillary: 185 mg/dL — ABNORMAL HIGH (ref 70–99)
Glucose-Capillary: 241 mg/dL — ABNORMAL HIGH (ref 70–99)

## 2014-10-13 LAB — COMPREHENSIVE METABOLIC PANEL
ALBUMIN: 1.6 g/dL — AB (ref 3.5–5.2)
ALT: 21 U/L (ref 0–35)
AST: 23 U/L (ref 0–37)
Alkaline Phosphatase: 158 U/L — ABNORMAL HIGH (ref 39–117)
Anion gap: 18 — ABNORMAL HIGH (ref 5–15)
BILIRUBIN TOTAL: 0.3 mg/dL (ref 0.3–1.2)
BUN: 82 mg/dL — AB (ref 6–23)
CHLORIDE: 94 meq/L — AB (ref 96–112)
CO2: 27 mEq/L (ref 19–32)
CREATININE: 3.37 mg/dL — AB (ref 0.50–1.10)
Calcium: 9.2 mg/dL (ref 8.4–10.5)
GFR calc Af Amer: 17 mL/min — ABNORMAL LOW (ref >90)
GFR calc non Af Amer: 15 mL/min — ABNORMAL LOW (ref >90)
Glucose, Bld: 193 mg/dL — ABNORMAL HIGH (ref 70–99)
POTASSIUM: 4.1 meq/L (ref 3.7–5.3)
Sodium: 139 mEq/L (ref 137–147)
TOTAL PROTEIN: 6.7 g/dL (ref 6.0–8.3)

## 2014-10-13 LAB — CBC WITH DIFFERENTIAL/PLATELET
BASOS ABS: 0 10*3/uL (ref 0.0–0.1)
BASOS PCT: 0 % (ref 0–1)
EOS ABS: 0 10*3/uL (ref 0.0–0.7)
Eosinophils Relative: 0 % (ref 0–5)
HCT: 26.6 % — ABNORMAL LOW (ref 36.0–46.0)
Hemoglobin: 9 g/dL — ABNORMAL LOW (ref 12.0–15.0)
Lymphocytes Relative: 3 % — ABNORMAL LOW (ref 12–46)
Lymphs Abs: 0.5 10*3/uL — ABNORMAL LOW (ref 0.7–4.0)
MCH: 26.7 pg (ref 26.0–34.0)
MCHC: 33.8 g/dL (ref 30.0–36.0)
MCV: 78.9 fL (ref 78.0–100.0)
MONOS PCT: 2 % — AB (ref 3–12)
Monocytes Absolute: 0.3 10*3/uL (ref 0.1–1.0)
Neutro Abs: 14.9 10*3/uL — ABNORMAL HIGH (ref 1.7–7.7)
Neutrophils Relative %: 95 % — ABNORMAL HIGH (ref 43–77)
Platelets: 256 10*3/uL (ref 150–400)
RBC: 3.37 MIL/uL — ABNORMAL LOW (ref 3.87–5.11)
RDW: 13.8 % (ref 11.5–15.5)
WBC: 15.6 10*3/uL — ABNORMAL HIGH (ref 4.0–10.5)

## 2014-10-13 LAB — CULTURE, BLOOD (ROUTINE X 2)

## 2014-10-13 LAB — VITAMIN D 1,25 DIHYDROXY
VITAMIN D 1, 25 (OH) TOTAL: 13 pg/mL — AB (ref 18–72)
Vitamin D2 1, 25 (OH)2: <8 pg/mL
Vitamin D3 1, 25 (OH)2: 13 pg/mL

## 2014-10-13 MED ORDER — SODIUM BICARBONATE 650 MG PO TABS
1300.0000 mg | ORAL_TABLET | Freq: Two times a day (BID) | ORAL | Status: DC
  Administered 2014-10-13 – 2014-10-17 (×8): 1300 mg via ORAL
  Filled 2014-10-13 (×9): qty 2

## 2014-10-13 MED ORDER — INSULIN ASPART 100 UNIT/ML ~~LOC~~ SOLN
0.0000 [IU] | Freq: Three times a day (TID) | SUBCUTANEOUS | Status: DC
  Administered 2014-10-13: 3 [IU] via SUBCUTANEOUS

## 2014-10-13 MED ORDER — CLONIDINE HCL 0.1 MG PO TABS
0.1000 mg | ORAL_TABLET | Freq: Three times a day (TID) | ORAL | Status: DC
  Administered 2014-10-13 – 2014-10-17 (×13): 0.1 mg via ORAL
  Filled 2014-10-13 (×16): qty 1

## 2014-10-13 NOTE — Plan of Care (Addendum)
Problem: Consults Goal: General Medical Patient Education See Patient Education Module for specific education.  Outcome: Not Applicable Date Met:  76/16/07 Pt unable to understand medical condition     Charted in error

## 2014-10-13 NOTE — Progress Notes (Signed)
Pt transferred to [address] room 21 per bed with all belongings RN aware pt is here

## 2014-10-13 NOTE — Progress Notes (Signed)
[place] TEAM 1 - Stepdown/ICU TEAM Progress Note  [name] [name] WGN:[id number] DOB: April 27, 1961 DOA: 10/10/2014 PCP: No primary care provider on file.  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  Workup showed nonsignificant carotid stenosis and 2-D echo noted severe LVH.  During that hospitalization she developed mild pulmonary edema which improved with diuresis. Follow up head CT and MRI brain showed improvement in the size of hemorrhage. Patient was discharged to inpatient rehabilitation for ongoing physical therapy on 12/3.   Patient reported she had not been participating in rehabilitation much due to worsening pain in her knees and ankles. Blood work revealed acute leukocytosis and progressive drop in H&H (11>9>8>6.1). Stool for occult blood done was negative. Chemistries noted anion gap metabolic acidosis with hyperkalemia and worsening renal function. Reportedly patient had poor by mouth intake. Patient had been reporting pain in her neck, worsened wrists, elbows, bilateral knee and ankle pain.   HPI/Subjective: Pt looks much better.  Pain is well controlled.  She denies cp, n/v, sob, or abdom pain.  Her joints feel "much better" and she is ready to participate w/ PT/OT.  Assessment/Plan:  SIRS No evidence of active infection - most c/w connective tissue disease flair - SIRS physiology improving - stop abx and follow  Diptheroids in 1/2 blood cx Most c/w contaminant - no indication for abx tx for this issue   Acute anemia progressive drop in her Hgb from 11.7 to 6.1 - stool occult blood negative - transfused 2 units PRBC - Hgb improved accordingly - follow trend - Fe studies c/w ACD  Right  pontine hemorrhage with acute encephalopathy and ? New RUE weakness acute encephalopathy and right upper extremity weakness seems new - had residual left upper extremity weakness during recent hospitalization - follow-up MRI brain w/o acute findings - delay PT/OT until joints cool off a bit   Acute renal failure superimposed on stage 4 chronic kidney disease acute worsening of her underlying chronic kidney disease - baseline appears to be 3.2-3.5 - Lasix and ACE inhibitor discontinued - on CellCept for her underlying lupus so check level - Nephrology following - no obstruction on renal US  Hyperkalemia Improving on IV bicarb - follow   Hyperglycemia  Due to steroids - A1c 5.2 - utilize SSI only for now   MCTD Patient's current autoimmune workup indicates that she most likely has an aggressive form of RA - significance of a + ANA in this setting is unclear - will need Rheumatology referral upon discharge - trial of prednisone 60mg  daily resulting in significant clinical improvement - xrays and Ortho eval unrevealing   Rheumatoid arthritis  Followed with Dr. Angeline Slim as outpt until one year ago - holding CellCept secondary to acute renal failure - patient both RF and CCP positive which is a bad prognostic sign - continue prednisone 60 mg daily - patient needs to be on DMARD - considering patient's renal function Dr. Joseph Art started Plaquenil - on discharge patient will need ophthalmology appointment to obtain baseline and f/u appointment w/ Rheumatologist   Essential hypertension BP trending up - adjust med tx and follow trend - avoid hydralazine due to MCD  Tobacco abuse Counseled on sequela of continued tobacco abuse to include increased risk of stroke, MI, death  Code  Status: FULL Family Communication: no family present at time of exam Disposition Plan: transfer to med bed - follow Hgb and renal fxn - PT/OT to help determine venue for d/c    Consultants: Ortho GI Nephrology  Procedures: EEG - 12/8 - normal   Antibiotics: Zosyn 12/8 Vanc 12/8 + 12/10  DVT prophylaxis: SCDs  Objective: Blood pressure 159/76, pulse 65, temperature 97.4 F (36.3 C), temperature source Oral, resp. rate 14, height 5' 5.5" (1.664 m), weight 70.6 kg (155 lb 10.3 oz), last menstrual period 07/18/2012, SpO2 96 %.  Intake/Output Summary (Last 24 hours) at 10/13/14 1327 Last data filed at 10/13/14 1212  Gross per 24 hour  Intake 2671.25 ml  Output   1825 ml  Net 846.25 ml   Exam: General: No acute respiratory distress - alert and conversant  Lungs: Clear to auscultation bilaterally without wheezes or crackles Cardiovascular: Regular rhythm - without murmur gallop or rub normal S1 and S2 Abdomen: Nontender, nondistended, soft, bowel sounds positive, no rebound, no ascites, no appreciable mass Extremities: No significant cyanosis, clubbing, or edema bilateral lower extremities  Data Reviewed: Basic Metabolic Panel:  Recent Labs Lab 10/10/14 0726 10/10/14 1931 10/11/14 0417 10/12/14 0251 10/12/14 2230 10/13/14 0316  NA 132*  --  131* 131*  --  139  K 5.5*  --  5.6* 4.5  --  4.1  CL 98  --  97 93*  --  94*  CO2 14*  --  12* 19  --  27  GLUCOSE 105*  --  105* 236*  --  193*  BUN 74*  --  76* 77*  --  82*  CREATININE 4.45*  --  4.48* 4.19*  --  3.37*  CALCIUM 9.4 8.9 9.2 9.1  --  9.2  MG  --   --   --   --  1.8  --     Liver Function Tests:  Recent Labs Lab 10/10/14 0726 10/11/14 0417 10/12/14 0251 10/13/14 0316  AST 31 26 27 23   ALT 23 22 21 21   ALKPHOS 111 142* 145* 158*  BILITOT 0.6 0.7 0.5 0.3  PROT 7.1 6.8 6.8 6.7  ALBUMIN 1.8* 1.6* 1.6* 1.6*   CBC:  Recent Labs Lab 10/10/14 0439 10/10/14 0726 10/11/14 0417 10/12/14 0251 10/13/14 0316  WBC 20.1* 21.9* 21.9* 19.9* 15.6*  NEUTROABS  --  19.9*  --   --  14.9*  HGB 6.2* 6.1* 8.9* 8.8* 9.0*  HCT 18.3* 18.9* 26.0* 25.1* 26.6*  MCV 82.1 80.4 81.8 78.7  78.9  PLT 218 232 217 231 256    Cardiac Enzymes:  Recent Labs Lab 10/10/14 1900  CKTOTAL 244*   BNP (last 3 results)  Recent Labs  10/05/14 0521  PROBNP 20673.0*    CBG:  Recent Labs Lab 10/12/14 2055 10/13/14 0054 10/13/14 0318 10/13/14 0913 10/13/14 1206  GLUCAP 246* 229* 196* 185* 241*    Recent Results (from the past 240 hour(s))  Urine culture     Status: None   Collection Time: 10/05/14  7:03 PM  Result Value Ref Range Status   Specimen Description URINE, CLEAN CATCH  Final   Special Requests NONE  Final   Culture  Setup Time   Final    10/05/2014 23:02 Performed at [facility] Count   Final    30,000 COLONIES/ML Performed at [facility]    Culture   Final    Multiple bacterial morphotypes present, none predominant. Suggest appropriate  recollection if clinically indicated. Performed at [facility]    Report Status 10/06/2014 FINAL  Final  Culture, blood (routine x 2)     Status: None (Preliminary result)   Collection Time: 10/10/14  9:38 AM  Result Value Ref Range Status   Specimen Description BLOOD LEFT FOREARM  Final   Special Requests BOTTLES DRAWN AEROBIC ONLY 5CC  Final   Culture  Setup Time   Final    10/10/2014 14:39 Performed at [facility]    Culture   Final           BLOOD CULTURE RECEIVED NO GROWTH TO DATE CULTURE WILL BE HELD FOR 5 DAYS BEFORE ISSUING A FINAL NEGATIVE REPORT Performed at [facility]    Report Status PENDING  Incomplete  Culture, blood (routine x 2)     Status: None   Collection Time: 10/10/14  9:44 AM  Result Value Ref Range Status   Specimen Description BLOOD LEFT ARM  Final   Special Requests   Final    BOTTLES DRAWN AEROBIC AND ANAEROBIC 5CC BLUE  4CC RED   Culture  Setup Time   Final    10/10/2014 14:39 Performed at [facility]    Culture   Final    DIPHTHEROIDS(CORYNEBACTERIUM SPECIES) Note: Standardized susceptibility testing for this  organism is not available. Note: Gram Stain Report Called to,Read Back By and Verified With: Caralee Ates RN [phone] Performed at [facility]    Report Status 10/13/2014 FINAL  Final  MRSA PCR Screening     Status: None   Collection Time: 10/10/14 10:48 AM  Result Value Ref Range Status   MRSA by PCR NEGATIVE NEGATIVE Final    Comment:        The GeneXpert MRSA Assay (FDA approved for NASAL specimens only), is one component of a comprehensive MRSA colonization surveillance program. It is not intended to diagnose MRSA infection nor to guide or monitor treatment for MRSA infections.      Studies:  Recent x-ray studies have been reviewed in detail by the Attending Physician  Scheduled Meds:  Scheduled Meds: . amLODipine  10 mg Oral Daily  . antiseptic oral rinse  7 mL Mouth Rinse BID  . carvedilol  12.5 mg Oral BID WC  . cloNIDine  0.1 mg Oral BID  . ferrous sulfate  325 mg Oral Q breakfast  . hydroxychloroquine  400 mg Oral Daily  . insulin aspart  0-15 Units Subcutaneous 6 times per day  . multivitamin  1 tablet Oral Daily  . nicotine  14 mg Transdermal Q24H  . pantoprazole  40 mg Oral BID  . predniSONE  60 mg Oral Q breakfast  . sodium bicarbonate  1,300 mg Oral TID  . sodium chloride  3 mL Intravenous Q12H  . vancomycin  1,000 mg Intravenous Q48H  . Vitamin D (Ergocalciferol)  50,000 Units Oral Q Wed    Time spent on care of this patient: 35 mins   Belvin Gauss T , MD   [facility] Office  [phone] Pager - Text Page per Loretha Stapler as per below:  On-Call/Text Page:      [email or web address]      password [email or web address]  If 7PM-7AM, please contact night-coverage [email or web address] Password [email or web address] 10/13/2014, 1:27 PM   LOS: 3 days

## 2014-10-13 NOTE — Progress Notes (Signed)
[facility] Progress Note  Subjective: Patient without GI complaint, 3 out of 3 stools heme negative.  Objective: Vital signs in last 24 hours: Temp:  [97.4 F (36.3 C)-98.2 F (36.8 C)] 97.4 F (36.3 C) (12/11 0800) Pulse Rate:  [61-91] 67 (12/11 0800) Resp:  [14-24] 16 (12/11 0800) BP: (144-172)/(63-118) 169/98 mmHg (12/11 0800) SpO2:  [93 %-97 %] 96 % (12/11 0800) Weight change:    PE: Unchanged  Lab Results: Results for orders placed or performed during the hospital encounter of 10/10/14 (from the past 24 hour(s))  Hemoglobin A1c     Status: None   Collection Time: 10/12/14 12:46 PM  Result Value Ref Range   Hgb A1c MFr Bld 5.2 <5.7 %   Mean Plasma Glucose 103 <117 mg/dL  Lipid panel     Status: Abnormal   Collection Time: 10/12/14 12:46 PM  Result Value Ref Range   Cholesterol 78 0 - 200 mg/dL   Triglycerides 92 <150 mg/dL   HDL 12 (L) >39 mg/dL   Total CHOL/HDL Ratio 6.5 RATIO   VLDL 18 0 - 40 mg/dL   LDL Cholesterol 48 0 - 99 mg/dL  Glucose, capillary     Status: Abnormal   Collection Time: 10/12/14  2:06 PM  Result Value Ref Range   Glucose-Capillary 244 (H) 70 - 99 mg/dL  Glucose, capillary     Status: Abnormal   Collection Time: 10/12/14  4:14 PM  Result Value Ref Range   Glucose-Capillary 228 (H) 70 - 99 mg/dL  Glucose, capillary     Status: Abnormal   Collection Time: 10/12/14  8:55 PM  Result Value Ref Range   Glucose-Capillary 246 (H) 70 - 99 mg/dL  Magnesium     Status: None   Collection Time: 10/12/14 10:30 PM  Result Value Ref Range   Magnesium 1.8 1.5 - 2.5 mg/dL  Glucose, capillary     Status: Abnormal   Collection Time: 10/13/14 12:54 AM  Result Value Ref Range   Glucose-Capillary 229 (H) 70 - 99 mg/dL  CBC with Differential     Status: Abnormal   Collection Time: 10/13/14  3:16 AM  Result Value Ref Range   WBC 15.6 (H) 4.0 - 10.5 K/uL   RBC 3.37 (L) 3.87 - 5.11 MIL/uL   Hemoglobin 9.0 (L) 12.0 - 15.0 g/dL   HCT 26.6 (L)  36.0 - 46.0 %   MCV 78.9 78.0 - 100.0 fL   MCH 26.7 26.0 - 34.0 pg   MCHC 33.8 30.0 - 36.0 g/dL   RDW 13.8 11.5 - 15.5 %   Platelets 256 150 - 400 K/uL   Neutrophils Relative % 95 (H) 43 - 77 %   Neutro Abs 14.9 (H) 1.7 - 7.7 K/uL   Lymphocytes Relative 3 (L) 12 - 46 %   Lymphs Abs 0.5 (L) 0.7 - 4.0 K/uL   Monocytes Relative 2 (L) 3 - 12 %   Monocytes Absolute 0.3 0.1 - 1.0 K/uL   Eosinophils Relative 0 0 - 5 %   Eosinophils Absolute 0.0 0.0 - 0.7 K/uL   Basophils Relative 0 0 - 1 %   Basophils Absolute 0.0 0.0 - 0.1 K/uL  Comprehensive metabolic panel     Status: Abnormal   Collection Time: 10/13/14  3:16 AM  Result Value Ref Range   Sodium 139 137 - 147 mEq/L   Potassium 4.1 3.7 - 5.3 mEq/L   Chloride 94 (L) 96 - 112 mEq/L   CO2 27 19 -  32 mEq/L   Glucose, Bld 193 (H) 70 - 99 mg/dL   BUN 82 (H) 6 - 23 mg/dL   Creatinine, Ser 3.37 (H) 0.50 - 1.10 mg/dL   Calcium 9.2 8.4 - 10.5 mg/dL   Total Protein 6.7 6.0 - 8.3 g/dL   Albumin 1.6 (L) 3.5 - 5.2 g/dL   AST 23 0 - 37 U/L   ALT 21 0 - 35 U/L   Alkaline Phosphatase 158 (H) 39 - 117 U/L   Total Bilirubin 0.3 0.3 - 1.2 mg/dL   GFR calc non Af Amer 15 (L) >90 mL/min   GFR calc Af Amer 17 (L) >90 mL/min   Anion gap 18 (H) 5 - 15  Glucose, capillary     Status: Abnormal   Collection Time: 10/13/14  3:18 AM  Result Value Ref Range   Glucose-Capillary 196 (H) 70 - 99 mg/dL    Studies/Results: No results found.    Assessment: Anemia, reason for acute drop unclear but with 3 heme-negative stools almost certainly not due to GI blood loss.  Plan: I would hold off on endoscopic evaluation unless stools become heme-positive. She reportedly had a colonoscopy about 5 or 6 years ago but I cannot locate these results. Will sign off for now. Please call back if stools become heme-positive.    [name] [name] C 10/13/2014, 8:12 AM

## 2014-10-13 NOTE — Progress Notes (Signed)
Admit: 10/10/2014 LOS: 3  69F with CKD, BL SCr high 2s/low 3s, originally admitted with small ICH, transferred from [facility] 10/10/14 with AoCKD, Hyperkalemia (On ACEi/MRB), persistent metabolic acidosis. Has arthralgias, leukocytosis, F/C. Hx/o SLE w/ unclear course.   Subjective:  Joints further improved Great UOP, SCr returning to baseline Acidosis resolved  12/10 0701 - 12/11 0700 In: 3010.5 [P.O.:480; I.V.:2330.5; IV Piggyback:200] Out: 1875 [Urine:1875]  Filed Weights   10/10/14 1049 10/11/14 0006 10/12/14 0000  Weight: 66.679 kg (147 lb) 65.8 kg (145 lb 1 oz) 70.6 kg (155 lb 10.3 oz)    Scheduled Meds: . amLODipine  10 mg Oral Daily  . antiseptic oral rinse  7 mL Mouth Rinse BID  . carvedilol  12.5 mg Oral BID WC  . cloNIDine  0.1 mg Oral BID  . ferrous sulfate  325 mg Oral Q breakfast  . hydroxychloroquine  400 mg Oral Daily  . insulin aspart  0-15 Units Subcutaneous 6 times per day  . multivitamin  1 tablet Oral Daily  . nicotine  14 mg Transdermal Q24H  . pantoprazole  40 mg Oral BID  . predniSONE  60 mg Oral Q breakfast  . sodium bicarbonate  1,300 mg Oral TID  . sodium chloride  3 mL Intravenous Q12H  . vancomycin  1,000 mg Intravenous Q48H  . Vitamin D (Ergocalciferol)  50,000 Units Oral Q Wed   Continuous Infusions: . dextrose 5 % 1,000 mL with sodium bicarbonate 150 mEq infusion 75 mL/hr at 10/13/14 0614   PRN Meds:.acetaminophen **OR** acetaminophen, camphor-menthol, cyclobenzaprine, ondansetron **OR** ondansetron (ZOFRAN) IV  Current Labs: reviewed    Physical Exam:  Blood pressure 169/98, pulse 67, temperature 97.4 F (36.3 C), temperature source Oral, resp. rate 16, height 5' 5.5" (1.664 m), weight 70.6 kg (155 lb 10.3 oz), last menstrual period 07/18/2012, SpO2 96 %. GEN: appears more comfortable ENT: NCAT EYES: EOMI CV: RRR, no rub, nl s1s2 PULM: clear b/l ABD: s/nt/nd MSK: b/l MCP, PIP effusions, erythrema, tenderness. B/l Knee tenderness.  Metatarsal squeeze test + b/l SKIN: no rashes/lesiosn EXT:no LEE NEURO: AAOx3  A/P 1. AoCKD; improving 1. Hx/o Membranous 2007 -- Sees [facility] at [facility] 2. BL SCr high 2s, low 3s 3. Renal US 10/10/14 w/o obstruction 4. Suspect recent worsening was hypovolemic / hemodynamic 5. Cont to hold furosemide, spionolactone, lisinopril 6. Daily renal panel 7. Strict I/Os 8. OK with Foley removal 9. Will arragne f/u with [facility] / [name]  2. Hyperkalemia, resolved 3. Metabolic Acidosis, resolved 1. Cont NaHCO3 PO 2. Titrate to serum HCO3 > 22 4. Hx/o Membranous GN 1. ACEi held 2. UP/C 1.88 on 10/10/14 5. SLE 1. On MMF 1000 BID, not sure how adherent as outpt 2. As per #6 6. Arthralgias, Polyarticular with swelling, erythema, tenderness 1. Seems to have responded to prednisone 2. +RF, CCP, Histone Ab.   1. ? Hydralazine induced lupus; would hold all further hydralazine 2. No other clear Drug induced lupus meds 3. ? Contribution of rheumatoid arthritis 3. Not sure how much MMF is helping here 4. Will need rheum eval 7. Low Grade Fever / Leukocytosis  1. Probably 2/2 #6 8. Recent [place] ICH 9. HTN, need tighter control 2/2 recent CVA/ICH 1. Amlodipine 10, Labetalol 100 BID 2. HR ok 3. Change to coreg 12.5 BID, stop labetalol 4. No hyralazine 5. ACEi contributed to hyperkalemia; can consider in future 10. Anemia, borderline microcytic 1. 2u PRBC 10/10/14, now Hb stable 2. FOBT neg 3. GI following  Will sign  off for now.  Please call with any questions or concerns.  Pt does need follow up with nephrology and I will arrange.    Pearson Grippe MD 10/13/2014, 8:42 AM   Recent Labs Lab 10/11/14 0417 10/12/14 0251 10/13/14 0316  NA 131* 131* 139  K 5.6* 4.5 4.1  CL 97 93* 94*  CO2 12* 19 27  GLUCOSE 105* 236* 193*  BUN 76* 77* 82*  CREATININE 4.48* 4.19* 3.37*  CALCIUM 9.2 9.1 9.2    Recent Labs Lab 10/10/14 0726 10/11/14 0417 10/12/14 0251 10/13/14 0316  WBC 21.9* 21.9*  19.9* 15.6*  NEUTROABS 19.9*  --   --  14.9*  HGB 6.1* 8.9* 8.8* 9.0*  HCT 18.9* 26.0* 25.1* 26.6*  MCV 80.4 81.8 78.7 78.9  PLT 232 217 231 256

## 2014-10-13 NOTE — Progress Notes (Signed)
NURSING PROGRESS NOTE  TY [facility] [id number] Transfer Data: 10/13/2014 5:16 PM Attending Provider: Cherene Altes, MD PCP:No primary care provider on file. Code Status: Full  [name] [name] is a 53 y.o. female patient transferred from [place]  -No acute distress noted.  -No complaints of shortness of breath.  -No complaints of chest pain.    Blood pressure 159/76, pulse 65, temperature 97.4 F (36.3 C), temperature source Oral, resp. rate 14, height 5' 5.5" (1.664 m), weight 70.6 kg (155 lb 10.3 oz), last menstrual period 07/18/2012, SpO2 96 %.    Allergies:  Sulfa antibiotics  Past Medical History:   has a past medical history of Hypertension; Lupus; CKD (chronic kidney disease); Diabetes mellitus; Stenosis of cervical spine region; Metatarsal bone fracture (right); Chronic ankle pain; Membranous glomerulonephritis; Arthritis; Anemia; and Stroke.  Past Surgical History:   has past surgical history that includes Breast biopsy.  Social History:   reports that she has been smoking Cigarettes.  She has a 15 pack-year smoking history. She has never used smokeless tobacco. She reports that she does not drink alcohol or use illicit drugs.  Skin: Intact  Patient/Family orientated to room. Information packet given to patient/family. Admission inpatient armband information verified with patient/family to include name and date of birth and placed on patient arm. Side rails up x 2, fall assessment and education completed with patient/family. Patient/family able to verbalize understanding of risk associated with falls and verbalized understanding to call for assistance before getting out of bed. Call light within reach. Patient/family able to voice and demonstrate understanding of unit orientation instructions.    Will continue to evaluate and treat per MD orders.

## 2014-10-13 NOTE — Evaluation (Signed)
Occupational Therapy Evaluation Patient Details Name: [name] [name] MRN: [id number] DOB: [birth year] Today's Date: 10/13/2014    History of Present Illness 53 y/o black female with complicated history with concern for sepsis and polyarthralgias/myalgias.  Recently was at [facility] following admission with acute pontine hemorrhagic infarct. PMH positive for hyperlipidemia, HTN and compliance issues.   Clinical Impression   Pt admitted with above. She demonstrates the below listed deficits and will benefit from continued OT to maximize safety and independence with BADLs.  Pt presents to OT with ataxia; impaired sensation, decreased trunk control, and decreased UE funtion.  She requires min - mod A with BADLs.  She is very motivated, and should make excellent progress. Recommend CIR.       Follow Up Recommendations  CIR    Equipment Recommendations  3 in 1 bedside comode    Recommendations for Other Services Rehab consult     Precautions / Restrictions Precautions Precautions: Fall      Mobility Bed Mobility Overal bed mobility: Needs Assistance Bed Mobility: Supine to Sit     Supine to sit: Min guard     General bed mobility comments: Vcs for positioning at EOB  Transfers Overall transfer level: Needs assistance Equipment used: Rolling walker (2 wheeled) Transfers: Sit to/from [employer] Sit to Stand: Min assist;+2 physical assistance Stand pivot transfers: Mod assist       General transfer comment: assist to power to upright, VCs for hand placement and positioning    Balance Overall balance assessment: Needs assistance Sitting-balance support: Feet supported Sitting balance-Leahy Scale: Fair     Standing balance support: Bilateral upper extremity supported Standing balance-Leahy Scale: Poor                              ADL Overall ADL's : Needs assistance/impaired Eating/Feeding: Set up;Bed level   Grooming: Wash/dry  hands;Wash/dry face;Oral care;Minimal assistance;Standing   Upper Body Bathing: Minimal assitance;Sitting   Lower Body Bathing: Moderate assistance;Sit to/from stand   Upper Body Dressing : Minimal assistance;Sitting   Lower Body Dressing: Moderate assistance;Sit to/from stand Lower Body Dressing Details (indicate cue type and reason): Pt able to don/doff socks.  Dependent upon UE support for standing  Toilet Transfer: Minimal assistance;+2 for physical assistance Toilet Transfer Details (indicate cue type and reason): facilitation for knee control and hip extension as well as trunk control  Toileting- Clothing Manipulation and Hygiene: Moderate assistance;Sitting/lateral lean       Functional mobility during ADLs: Minimal assistance;+2 for physical assistance General ADL Comments: Pt very motivated     Vision                 Additional Comments: recomment further assessment    Perception     Praxis      Pertinent Vitals/Pain Pain Assessment: 0-10 Pain Score: 4  Faces Pain Scale: Hurts little more Pain Location: sore Lt. LE (reports ankle soreness Pain Descriptors / Indicators: Sore Pain Intervention(s): Monitored during session     Hand Dominance Right   Extremity/Trunk Assessment Upper Extremity Assessment Upper Extremity Assessment: LUE deficits/detail;Generalized weakness;RUE deficits/detail RUE Deficits / Details: generalized weakness LUE Deficits / Details: Pt with full AROM.  Strength 4-/5; impaired sensation and coordination LUE Sensation: decreased light touch;decreased proprioception LUE Coordination: decreased fine motor;decreased gross motor   Lower Extremity Assessment Lower Extremity Assessment: Defer to PT evaluation   Cervical / Trunk Assessment Cervical / Trunk Assessment: Other exceptions Cervical /  Trunk Exceptions: Lt. lateral flexion (passive) with dynamic standing and gait   Communication Communication Communication: No  difficulties   Cognition Arousal/Alertness: Awake/alert Behavior During Therapy: WFL for tasks assessed/performed Overall Cognitive Status: Within Functional Limits for tasks assessed                     General Comments       Exercises       Shoulder Instructions      Home Living Family/patient expects to be discharged to:: Inpatient rehab Living Arrangements: Children Available Help at Discharge: Family;Available 24 hours/day Type of Home: House Home Access: Stairs to enter [employer] of Steps: 4 Entrance Stairs-Rails: Right Home Layout: Two level;Able to live on main level with bedroom/bathroom     Bathroom Shower/Tub: Walk-in shower;Door   [employer] Toilet: Handicapped height Bathroom Accessibility: Yes How Accessible: Accessible via walker Home Equipment: Walker - standard      Lives With: Son;Daughter    Prior Functioning/Environment Level of Independence: Independent        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 Hobbies include spending time with family and going out to eat, play candy crush    OT Diagnosis: Generalized weakness;Disturbance of vision;Ataxia   OT Problem List: Decreased strength;Decreased activity tolerance;Impaired balance (sitting and/or standing);Impaired vision/perception;Decreased coordination;Decreased knowledge of use of DME or AE;Impaired sensation;Impaired UE functional use   OT Treatment/Interventions: Self-care/ADL training;Neuromuscular education;DME and/or AE instruction;Therapeutic activities;Visual/perceptual remediation/compensation;Patient/family education;Balance training    OT Goals(Current goals can be found in the care plan section) Acute Rehab OT Goals Patient Stated Goal: To return to independent OT Goal Formulation: With patient Time For Goal Achievement: 10/27/14 Potential to Achieve Goals: Good ADL Goals Pt Will Perform Grooming: with min guard assist;standing Pt Will Perform Upper Body  Bathing: with set-up;sitting Pt Will Perform Lower Body Bathing: with min guard assist;sit to/from stand Pt Will Perform Upper Body Dressing: with set-up;sitting Pt Will Perform Lower Body Dressing: with min guard assist;sit to/from stand Pt Will Transfer to Toilet: with min guard assist;ambulating;grab bars Pt Will Perform Toileting - Clothing Manipulation and hygiene: with min guard assist;sit to/from stand Additional ADL Goal #1: pt will participate in further visual assessment  OT Frequency: Min 3X/week   Barriers to D/C:            Co-evaluation   Reason for Co-Treatment: Complexity of the patient's impairments (multi-system involvement)   OT goals addressed during session: ADL's and self-care      End of Session Equipment Utilized During Treatment: Gait belt;Rolling walker Nurse Communication: Mobility status  Activity Tolerance: Patient tolerated treatment well Patient left: in bed;with call bell/phone within reach   Time: 1516-1550 OT Time Calculation (min): 34 min Charges:  OT General Charges $OT Visit: 1 Procedure OT Evaluation $Initial OT Evaluation Tier I: 1 Procedure OT Treatments $Neuromuscular Re-education: 8-22 mins G-Codes:    Salvatrice Morandi M Oct 17, 2014, 10:31 PM

## 2014-10-13 NOTE — Progress Notes (Signed)
Over the last three days patient has become increasing lethargic and edematous, she has occasionally had low grade temp elevations, her blood work indicates a WBC of 21.9, RBC of 2.35 and hemoglobin of 6.1.  The urine culture was negative and the blood culture was positive for gram positive rods. Consult to internal medicine per MD resulted in recomendation patient being moved to acute care for treatment.

## 2014-10-13 NOTE — Progress Notes (Signed)
Physical Therapy Treatment Patient Details Name: [name] [name] MRN: [id number] DOB: [birth year] Today's Date: 10/13/2014    History of Present Illness 53 y/o black female with complicated history with concern for sepsis and polyarthralgias/myalgias.  Recently was at [facility] following admission with acute pontine hemorrhagic infarct. PMH positive for hyperlipidemia, HTN and compliance issues.    PT Comments    Patient with significant improvements in activity tolerance and function this day, reports resolution of joint pain and swelling. Patient tolerated gait re-training and neuro-facilitation of functional positioning for standing and mobility activities. At this time, feel patient is ideal candidate to return to CIR when medically ready. Will continue to see and progress as tolerated.   Follow Up Recommendations  CIR     Equipment Recommendations  Rolling walker with 5" wheels    Recommendations for Other Services Rehab consult     Precautions / Restrictions Precautions Precautions: Fall Precaution Comments: swollen joints, increased pain Restrictions Weight Bearing Restrictions: No    Mobility  Bed Mobility Overal bed mobility: Needs Assistance Bed Mobility: Supine to Sit     Supine to sit: Min guard     General bed mobility comments: Vcs for positioning at EOB  Transfers Overall transfer level: Needs assistance Equipment used: Rolling walker (2 wheeled) Transfers: Sit to/from Stand Sit to Stand: Min assist;+2 physical assistance         General transfer comment: assist to power to upright, VCs for hand placement and positioning  Ambulation/Gait Ambulation/Gait assistance: Min assist;+2 physical assistance Ambulation Distance (Feet): 60 Feet Assistive device: Rolling walker (2 wheeled) Gait Pattern/deviations: Ataxic;Step-through pattern;Decreased stride length;Drifts right/left;Narrow base of support Gait velocity: decreased Gait velocity interpretation:  Below normal speed for age/gender General Gait Details: Gait retraining with tactile facilitation of LE position from heel strike through mid stance with attention to posture and postioning to address hyperextension LLE.  +2 faciliation for hip and trunk control   Stairs            Wheelchair Mobility    Modified Rankin (Stroke Patients Only)       Balance Overall balance assessment: Needs assistance Sitting-balance support: Feet supported Sitting balance-Leahy Scale: Fair     Standing balance support: Bilateral upper extremity supported Standing balance-Leahy Scale: Poor                      Cognition Arousal/Alertness: Awake/alert Behavior During Therapy: WFL for tasks assessed/performed Overall Cognitive Status: Within Functional Limits for tasks assessed                      Exercises      General Comments        Pertinent Vitals/Pain Pain Assessment: 0-10 Pain Score: 4  Pain Location: sore LLE (reports ankle soreness) Pain Descriptors / Indicators: Sore Pain Intervention(s): Monitored during session;Repositioned    Home Living Family/patient expects to be discharged to:: Inpatient rehab Living Arrangements: Children Available Help at Discharge: Family;Available 24 hours/day Type of Home: House Home Access: Stairs to enter Entrance Stairs-Rails: Right Home Layout: Two level;Able to live on main level with bedroom/bathroom Home Equipment: Walker - standard      Prior Function Level of Independence: Independent      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 Hobbies include spending time with family and going out to eat, play candy crush   PT Goals (current goals can now be found in the care plan section) Acute Rehab PT Goals Patient Stated Goal: To  return to independent PT Goal Formulation: With patient Time For Goal Achievement: 10/27/14 Potential to Achieve Goals: Good    Frequency  Min 3X/week    PT Plan      Co-evaluation  PT/OT/SLP Co-Evaluation/Treatment: Yes Reason for Co-Treatment: Complexity of the patient's impairments (multi-system involvement) PT goals addressed during session: Mobility/safety with mobility       End of Session Equipment Utilized During Treatment: Gait belt Activity Tolerance: Patient tolerated treatment well;Patient limited by fatigue Patient left: in bed;with call bell/phone within reach     Time: 1516-1550 PT Time Calculation (min) (ACUTE ONLY): 34 min  Charges:  $Gait Training: 8-22 mins                    G CodesDuncan Dull November 08, 2014, [place], [place] DPT  [phone]

## 2014-10-13 NOTE — Progress Notes (Signed)
Pt has 13 beats of V-tach, pt was asymptomatic, vital signs were stable, Rogue Bussing on call was notified, orders received for labs.------Luverne Zerkle, rn

## 2014-10-13 NOTE — Progress Notes (Signed)
Inpatient Diabetes Program Recommendations  AACE/ADA: New Consensus Statement on Inpatient Glycemic Control (2013)  Target Ranges:  Prepandial:   less than 140 mg/dL      Peak postprandial:   less than 180 mg/dL (1-2 hours)      Critically ill patients:  140 - 180 mg/dL   Reason for Visit: Hyperglycemia  Diabetes history: Dm2 Outpatient Diabetes medications: None Current orders for Inpatient glycemic control: Novolog moderate Q4H  Results for [facility] (MRN [id number]) as of 10/13/2014 12:03  Ref. Range 10/12/2014 16:14 10/12/2014 20:55 10/13/2014 00:54 10/13/2014 03:18 10/13/2014 09:13  Glucose-Capillary Latest Range: 70-99 mg/dL 228 (H) 246 (H) 229 (H) 196 (H) 185 (H)    Inpatient Diabetes Program Recommendations Insulin - Basal: Consider addition of Levemir 8 units QHS  Note: Will continue to follow. Thank you. Lorenda Peck, RD, LDN, CDE Inpatient Diabetes Coordinator [phone]

## 2014-10-14 DIAGNOSIS — I10 Essential (primary) hypertension: Secondary | ICD-10-CM

## 2014-10-14 LAB — BASIC METABOLIC PANEL
Anion gap: 17 — ABNORMAL HIGH (ref 5–15)
BUN: 90 mg/dL — ABNORMAL HIGH (ref 6–23)
CO2: 29 mEq/L (ref 19–32)
Calcium: 9.1 mg/dL (ref 8.4–10.5)
Chloride: 94 mEq/L — ABNORMAL LOW (ref 96–112)
Creatinine, Ser: 3.27 mg/dL — ABNORMAL HIGH (ref 0.50–1.10)
GFR, EST AFRICAN AMERICAN: 18 mL/min — AB (ref >90)
GFR, EST NON AFRICAN AMERICAN: 15 mL/min — AB (ref >90)
Glucose, Bld: 206 mg/dL — ABNORMAL HIGH (ref 70–99)
POTASSIUM: 3.7 meq/L (ref 3.7–5.3)
SODIUM: 140 meq/L (ref 137–147)

## 2014-10-14 LAB — CBC
HCT: 25.8 % — ABNORMAL LOW (ref 36.0–46.0)
Hemoglobin: 8.8 g/dL — ABNORMAL LOW (ref 12.0–15.0)
MCH: 28.2 pg (ref 26.0–34.0)
MCHC: 34.1 g/dL (ref 30.0–36.0)
MCV: 82.7 fL (ref 78.0–100.0)
PLATELETS: 267 10*3/uL (ref 150–400)
RBC: 3.12 MIL/uL — AB (ref 3.87–5.11)
RDW: 13.9 % (ref 11.5–15.5)
WBC: 11.6 10*3/uL — ABNORMAL HIGH (ref 4.0–10.5)

## 2014-10-14 LAB — MYCOPHENOLIC ACID (CELLCEPT)
MPA GLUCURONIDE: 315 ug/mL — AB (ref 35.0–100.0)
MPA: 1.2 ug/mL (ref 1.0–3.5)

## 2014-10-14 LAB — GLUCOSE, CAPILLARY
GLUCOSE-CAPILLARY: 261 mg/dL — AB (ref 70–99)
Glucose-Capillary: 172 mg/dL — ABNORMAL HIGH (ref 70–99)
Glucose-Capillary: 192 mg/dL — ABNORMAL HIGH (ref 70–99)
Glucose-Capillary: 299 mg/dL — ABNORMAL HIGH (ref 70–99)

## 2014-10-14 MED ORDER — PREDNISONE 50 MG PO TABS
60.0000 mg | ORAL_TABLET | Freq: Every day | ORAL | Status: DC
  Administered 2014-10-14 – 2014-10-16 (×3): 60 mg via ORAL
  Filled 2014-10-14 (×4): qty 1

## 2014-10-14 MED ORDER — FERROUS SULFATE 325 (65 FE) MG PO TABS
325.0000 mg | ORAL_TABLET | Freq: Every day | ORAL | Status: DC
  Administered 2014-10-14 – 2014-10-17 (×4): 325 mg via ORAL
  Filled 2014-10-14 (×5): qty 1

## 2014-10-14 MED ORDER — INSULIN ASPART 100 UNIT/ML ~~LOC~~ SOLN
0.0000 [IU] | Freq: Three times a day (TID) | SUBCUTANEOUS | Status: DC
  Administered 2014-10-14: 3 [IU] via SUBCUTANEOUS
  Administered 2014-10-14: 8 [IU] via SUBCUTANEOUS
  Administered 2014-10-14: 3 [IU] via SUBCUTANEOUS
  Administered 2014-10-15: 15 [IU] via SUBCUTANEOUS
  Administered 2014-10-15: 5 [IU] via SUBCUTANEOUS
  Administered 2014-10-15: 8 [IU] via SUBCUTANEOUS

## 2014-10-14 MED ORDER — CARVEDILOL 12.5 MG PO TABS
12.5000 mg | ORAL_TABLET | Freq: Two times a day (BID) | ORAL | Status: DC
  Administered 2014-10-14 – 2014-10-15 (×3): 12.5 mg via ORAL
  Filled 2014-10-14 (×5): qty 1

## 2014-10-14 NOTE — Progress Notes (Signed)
Progress Note  [name] [name] QAS:[id number] DOB: 12-08-1960 DOA: 10/10/2014 PCP: No primary care provider on file.  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  Workup showed nonsignificant carotid stenosis and 2-D echo noted severe LVH.  During that hospitalization she developed mild pulmonary edema which improved with diuresis. Follow up head CT and MRI brain showed improvement in the size of hemorrhage. Patient was discharged to inpatient rehabilitation for ongoing physical therapy on 12/3.   Patient reported she had not been participating in rehabilitation much due to worsening pain in her knees and ankles. Blood work revealed acute leukocytosis and progressive drop in H&H (11>9>8>6.1). Stool for occult blood done was negative. Chemistries noted anion gap metabolic acidosis with hyperkalemia and worsening renal function. Reportedly patient had poor by mouth intake. Patient had been reporting pain in her neck, worsened wrists, elbows, bilateral knee and ankle pain.   HPI/Subjective: Patient feels much better, denies any specific complaints. Ready for PT/OT.  Assessment/Plan:  SIRS No evidence of active infection - most c/w connective tissue disease flair - SIRS physiology improving - stop abx and follow. Continue prednisone. Start PT/OT.  Diptheroids in 1/2 blood cx Most c/w contaminant - no indication for abx tx for this issue   Acute anemia progressive drop in her Hgb from 11.7 to 6.1 - stool occult blood negative - transfused 2 units PRBC - Hgb improved accordingly - follow trend - Fe studies c/w ACD  Right pontine hemorrhage with acute encephalopathy and ? New RUE weakness acute  encephalopathy and right upper extremity weakness seems new - had residual left upper extremity weakness during recent hospitalization - follow-up MRI brain w/o acute findings - delay PT/OT until joints cool off a bit   Acute renal failure superimposed on stage 4 chronic kidney disease acute worsening of her underlying chronic kidney disease - baseline appears to be 3.2-3.5 - Lasix and ACE inhibitor discontinued - on CellCept for her underlying lupus so check level - Nephrology following - no obstruction on renal US This is improving, creatinine is 3.27 today.  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 [name] as outpt until one year ago - holding CellCept secondary to acute renal failure - patient both RF and CCP positive which is a bad prognostic sign - continue prednisone 60 mg daily - patient needs to be on DMARD - considering patient's renal function Dr. [name] started Plaquenil - on discharge patient will need ophthalmology appointment to obtain baseline and f/u appointment w/ Rheumatologist   Essential hypertension BP trending up - adjust med tx and follow trend - avoid hydralazine due to MCD  Tobacco abuse Counseled on sequela of continued tobacco abuse to include increased risk of stroke, MI, death  Code Status: FULL Family Communication: no family present at time of exam Disposition Plan: transfer to med bed -  follow Hgb and renal fxn - PT/OT to help determine venue for d/c   Consultants: Ortho GI Nephrology  Procedures: EEG - 12/8 -  normal   Antibiotics: Zosyn 12/8 Vanc 12/8 + 12/10  DVT prophylaxis: SCDs  Objective: Blood pressure 175/57, pulse 70, temperature 98.1 F (36.7 C), temperature source Oral, resp. rate 18, height 5' 5.5" (1.664 m), weight 70.6 kg (155 lb 10.3 oz), last menstrual period 07/18/2012, SpO2 98 %.  Intake/Output Summary (Last 24 hours) at 10/14/14 1053 Last data filed at 10/14/14 0751  Gross per 24 hour  Intake   1242 ml  Output   1550 ml  Net   -308 ml   Exam: General: No acute respiratory distress - alert and conversant  Lungs: Clear to auscultation bilaterally without wheezes or crackles Cardiovascular: Regular rhythm - without murmur gallop or rub normal S1 and S2 Abdomen: Nontender, nondistended, soft, bowel sounds positive, no rebound, no ascites, no appreciable mass Extremities: No significant cyanosis, clubbing, or edema bilateral lower extremities  Data Reviewed: Basic Metabolic Panel:  Recent Labs Lab 10/10/14 0726 10/10/14 1931 10/11/14 0417 10/12/14 0251 10/12/14 2230 10/13/14 0316 10/14/14 0450  NA 132*  --  131* 131*  --  139 140  K 5.5*  --  5.6* 4.5  --  4.1 3.7  CL 98  --  97 93*  --  94* 94*  CO2 14*  --  12* 19  --  27 29  GLUCOSE 105*  --  105* 236*  --  193* 206*  BUN 74*  --  76* 77*  --  82* 90*  CREATININE 4.45*  --  4.48* 4.19*  --  3.37* 3.27*  CALCIUM 9.4 8.9 9.2 9.1  --  9.2 9.1  MG  --   --   --   --  1.8  --   --     Liver Function Tests:  Recent Labs Lab 10/10/14 0726 10/11/14 0417 10/12/14 0251 10/13/14 0316  AST 31 26 27 23   ALT 23 22 21 21   ALKPHOS 111 142* 145* 158*  BILITOT 0.6 0.7 0.5 0.3  PROT 7.1 6.8 6.8 6.7  ALBUMIN 1.8* 1.6* 1.6* 1.6*   CBC:  Recent Labs Lab 10/10/14 0726 10/11/14 0417 10/12/14 0251 10/13/14 0316 10/14/14 0450  WBC 21.9* 21.9* 19.9* 15.6* 11.6*  NEUTROABS 19.9*  --   --  14.9*  --   HGB 6.1* 8.9* 8.8* 9.0* 8.8*  HCT 18.9* 26.0* 25.1* 26.6* 25.8*  MCV 80.4 81.8 78.7 78.9 82.7  PLT 232 217  231 256 267    Cardiac Enzymes:  Recent Labs Lab 10/10/14 1900  CKTOTAL 244*   BNP (last 3 results)  Recent Labs  10/05/14 0521  PROBNP 20673.0*    CBG:  Recent Labs Lab 10/13/14 0913 10/13/14 1206 10/13/14 1631 10/13/14 2142 10/14/14 0747  GLUCAP 185* 241* 193* 241* 172*    Recent Results (from the past 240 hour(s))  Urine culture     Status: None   Collection Time: 10/05/14  7:03 PM  Result Value Ref Range Status   Specimen Description URINE, CLEAN CATCH  Final   Special Requests NONE  Final   Culture  Setup Time   Final    10/05/2014 23:02 Performed at [place]   Final    30,000 COLONIES/ML Performed at [facility]    Culture   Final    Multiple bacterial morphotypes present, none predominant. Suggest appropriate recollection  if clinically indicated. Performed at [facility]    Report Status 10/06/2014 FINAL  Final  Culture, blood (routine x 2)     Status: None (Preliminary result)   Collection Time: 10/10/14  9:38 AM  Result Value Ref Range Status   Specimen Description BLOOD LEFT FOREARM  Final   Special Requests BOTTLES DRAWN AEROBIC ONLY 5CC  Final   Culture  Setup Time   Final    10/10/2014 14:39 Performed at [facility]    Culture   Final           BLOOD CULTURE RECEIVED NO GROWTH TO DATE CULTURE WILL BE HELD FOR 5 DAYS BEFORE ISSUING A FINAL NEGATIVE REPORT Performed at [facility]    Report Status PENDING  Incomplete  Culture, blood (routine x 2)     Status: None   Collection Time: 10/10/14  9:44 AM  Result Value Ref Range Status   Specimen Description BLOOD LEFT ARM  Final   Special Requests   Final    BOTTLES DRAWN AEROBIC AND ANAEROBIC 5CC BLUE  4CC RED   Culture  Setup Time   Final    10/10/2014 14:39 Performed at [facility]    Culture   Final    DIPHTHEROIDS(CORYNEBACTERIUM SPECIES) Note: Standardized susceptibility testing for this organism is not  available. Note: Gram Stain Report Called to,Read Back By and Verified With: Alessandra Grout RN [phone] Performed at [facility]    Report Status 10/13/2014 FINAL  Final  MRSA PCR Screening     Status: None   Collection Time: 10/10/14 10:48 AM  Result Value Ref Range Status   MRSA by PCR NEGATIVE NEGATIVE Final    Comment:        The GeneXpert MRSA Assay (FDA approved for NASAL specimens only), is one component of a comprehensive MRSA colonization surveillance program. It is not intended to diagnose MRSA infection nor to guide or monitor treatment for MRSA infections.      Studies:  Recent x-ray studies have been reviewed in detail by the Attending Physician  Scheduled Meds:  Scheduled Meds: . amLODipine  10 mg Oral Daily  . antiseptic oral rinse  7 mL Mouth Rinse BID  . carvedilol  12.5 mg Oral BID WC  . cloNIDine  0.1 mg Oral TID  . ferrous sulfate  325 mg Oral Q breakfast  . hydroxychloroquine  400 mg Oral Daily  . insulin aspart  0-15 Units Subcutaneous TID WC  . multivitamin  1 tablet Oral Daily  . nicotine  14 mg Transdermal Q24H  . pantoprazole  40 mg Oral BID  . predniSONE  60 mg Oral Q breakfast  . sodium bicarbonate  1,300 mg Oral BID  . sodium chloride  3 mL Intravenous Q12H  . Vitamin D (Ergocalciferol)  50,000 Units Oral Q Wed    Time spent on care of this patient: 35 mins   Birdie Hopes , MD   [facility] Office  [phone] Pager - Text Page per Shea Evans as per below:  On-Call/Text Page:      [email or web address]      password [email or web address]  If 7PM-7AM, please contact night-coverage [email or web address] Password [email or web address] 10/14/2014, 10:53 AM   LOS: 4 days

## 2014-10-15 LAB — RENAL FUNCTION PANEL
Albumin: 1.9 g/dL — ABNORMAL LOW (ref 3.5–5.2)
Anion gap: 17 — ABNORMAL HIGH (ref 5–15)
BUN: 94 mg/dL — ABNORMAL HIGH (ref 6–23)
CALCIUM: 9.4 mg/dL (ref 8.4–10.5)
CO2: 26 meq/L (ref 19–32)
Chloride: 93 mEq/L — ABNORMAL LOW (ref 96–112)
Creatinine, Ser: 3.26 mg/dL — ABNORMAL HIGH (ref 0.50–1.10)
GFR calc non Af Amer: 15 mL/min — ABNORMAL LOW (ref >90)
GFR, EST AFRICAN AMERICAN: 18 mL/min — AB (ref >90)
GLUCOSE: 231 mg/dL — AB (ref 70–99)
Phosphorus: 4.6 mg/dL (ref 2.3–4.6)
Potassium: 3.8 mEq/L (ref 3.7–5.3)
Sodium: 136 mEq/L — ABNORMAL LOW (ref 137–147)

## 2014-10-15 LAB — GLUCOSE, CAPILLARY
GLUCOSE-CAPILLARY: 298 mg/dL — AB (ref 70–99)
GLUCOSE-CAPILLARY: 232 mg/dL — AB (ref 70–99)
GLUCOSE-CAPILLARY: 381 mg/dL — AB (ref 70–99)
GLUCOSE-CAPILLARY: 324 mg/dL — AB (ref 70–99)

## 2014-10-15 MED ORDER — INSULIN ASPART 100 UNIT/ML ~~LOC~~ SOLN
0.0000 [IU] | Freq: Three times a day (TID) | SUBCUTANEOUS | Status: DC
  Administered 2014-10-16: 5 [IU] via SUBCUTANEOUS
  Administered 2014-10-16: 3 [IU] via SUBCUTANEOUS
  Administered 2014-10-16: 8 [IU] via SUBCUTANEOUS
  Administered 2014-10-17: 5 [IU] via SUBCUTANEOUS
  Administered 2014-10-17: 3 [IU] via SUBCUTANEOUS

## 2014-10-15 MED ORDER — ISOSORBIDE MONONITRATE ER 60 MG PO TB24
60.0000 mg | ORAL_TABLET | Freq: Every day | ORAL | Status: DC
  Administered 2014-10-15 – 2014-10-17 (×3): 60 mg via ORAL
  Filled 2014-10-15 (×3): qty 1

## 2014-10-15 MED ORDER — FUROSEMIDE 10 MG/ML IJ SOLN
40.0000 mg | Freq: Once | INTRAMUSCULAR | Status: AC
  Administered 2014-10-15: 40 mg via INTRAVENOUS
  Filled 2014-10-15: qty 4

## 2014-10-15 MED ORDER — INSULIN ASPART 100 UNIT/ML ~~LOC~~ SOLN
0.0000 [IU] | Freq: Every day | SUBCUTANEOUS | Status: DC
  Administered 2014-10-15: 4 [IU] via SUBCUTANEOUS
  Administered 2014-10-16: 3 [IU] via SUBCUTANEOUS

## 2014-10-15 MED ORDER — CLONIDINE HCL 0.1 MG PO TABS
0.1000 mg | ORAL_TABLET | Freq: Once | ORAL | Status: AC
  Administered 2014-10-15: 0.1 mg via ORAL
  Filled 2014-10-15: qty 1

## 2014-10-15 MED ORDER — CLONIDINE HCL 0.1 MG PO TABS
0.1000 mg | ORAL_TABLET | Freq: Once | ORAL | Status: AC
  Administered 2014-10-16: 0.1 mg via ORAL
  Filled 2014-10-15 (×2): qty 1

## 2014-10-15 MED ORDER — HYDRALAZINE HCL 10 MG PO TABS
10.0000 mg | ORAL_TABLET | Freq: Once | ORAL | Status: DC
  Filled 2014-10-15: qty 1

## 2014-10-15 MED ORDER — CARVEDILOL 25 MG PO TABS
25.0000 mg | ORAL_TABLET | Freq: Two times a day (BID) | ORAL | Status: DC
  Administered 2014-10-15 – 2014-10-16 (×2): 25 mg via ORAL
  Filled 2014-10-15 (×4): qty 1

## 2014-10-15 MED ORDER — LABETALOL HCL 5 MG/ML IV SOLN
5.0000 mg | Freq: Once | INTRAVENOUS | Status: DC
  Filled 2014-10-15: qty 4

## 2014-10-15 NOTE — Progress Notes (Signed)
On-call provider notified of SBP 179 following pm dose of Clonidine. Pt complains of no acute distress at this time. Will await orders and continue to monitor.

## 2014-10-15 NOTE — Progress Notes (Signed)
Progress Note  [name] [name] WVP:[id number] DOB: 11/16/1960 DOA: 10/10/2014 PCP: No primary care provider on file.  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  Workup showed nonsignificant carotid stenosis and 2-D echo noted severe LVH.  During that hospitalization she developed mild pulmonary edema which improved with diuresis. Follow up head CT and MRI brain showed improvement in the size of hemorrhage. Patient was discharged to inpatient rehabilitation for ongoing physical therapy on 12/3.   Patient reported she had not been participating in rehabilitation much due to worsening pain in her knees and ankles. Blood work revealed acute leukocytosis and progressive drop in H&H (11>9>8>6.1). Stool for occult blood done was negative. Chemistries noted anion gap metabolic acidosis with hyperkalemia and worsening renal function. Reportedly patient had poor by mouth intake. Patient had been reporting pain in her neck, worsened wrists, elbows, bilateral knee and ankle pain.   HPI/Subjective: Patient emotional this morning, when I asked her she said she remembers something upset her. Denies any chest pain or shortness of breath. Blood pressure is in the high side  Assessment/Plan:  SIRS No evidence of active infection - most c/w connective tissue disease flair - SIRS physiology improving - stop abx and follow. Continue prednisone. Start PT/OT.  Diptheroids in 1/2 blood cx Most c/w contaminant - no indication for abx tx for this issue. No evidence of infection.   Essential hypertension BP trending up, her Lisinopril discontinued because of the worsening renal function. I will adjust HTN medication as it is  trending up.  Acute anemia progressive drop in her Hgb from 11.7 to 6.1 - stool occult blood negative - transfused 2 units PRBC - Hgb improved accordingly - follow trend - Fe studies c/w ACD  Right pontine hemorrhage with acute encephalopathy and ? New RUE weakness acute encephalopathy and right upper extremity weakness seems new - had residual left upper extremity weakness during recent hospitalization - follow-up MRI brain w/o acute findings - delay PT/OT until joints cool off a bit   Acute renal failure superimposed on stage 4 chronic kidney disease acute worsening of her underlying chronic kidney disease - baseline appears to be 3.2-3.5 - Lasix and ACE inhibitor discontinued - on CellCept for her underlying lupus so check level - Nephrology following - no obstruction on renal US This is improving, creatinine is 3.27 today.  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 [name] as outpt until one year ago - holding CellCept secondary to acute renal failure - patient both RF and CCP positive which is a bad prognostic sign - continue prednisone 60 mg daily - patient needs to be on DMARD - considering patient's renal function Dr. [name] started Plaquenil - on discharge patient will need ophthalmology appointment to obtain baseline and f/u appointment w/ Rheumatologist   Tobacco abuse Counseled on sequela of continued  tobacco abuse to include increased risk of stroke, MI, death  Code Status: FULL Family Communication: no family present at time of  exam Disposition Plan: transfer to med bed - follow Hgb and renal fxn - PT/OT to help determine venue for d/c   Consultants: Ortho GI Nephrology  Procedures: EEG - 12/8 - normal   Antibiotics: Zosyn 12/8 Vanc 12/8 + 12/10  DVT prophylaxis: SCDs  Objective: Blood pressure 182/71, pulse 79, temperature 98.2 F (36.8 C), temperature source Oral, resp. rate 16, height 5' 5.5" (1.664 m), weight 70.6 kg (155 lb 10.3 oz), last menstrual period 07/18/2012, SpO2 95 %.  Intake/Output Summary (Last 24 hours) at 10/15/14 1224 Last data filed at 10/15/14 0543  Gross per 24 hour  Intake    120 ml  Output    800 ml  Net   -680 ml   Exam: General: No acute respiratory distress - alert and conversant  Lungs: Clear to auscultation bilaterally without wheezes or crackles Cardiovascular: Regular rhythm - without murmur gallop or rub normal S1 and S2 Abdomen: Nontender, nondistended, soft, bowel sounds positive, no rebound, no ascites, no appreciable mass Extremities: No significant cyanosis, clubbing, or edema bilateral lower extremities  Data Reviewed: Basic Metabolic Panel:  Recent Labs Lab 10/11/14 0417 10/12/14 0251 10/12/14 2230 10/13/14 0316 10/14/14 0450 10/15/14 0524  NA 131* 131*  --  139 140 136*  K 5.6* 4.5  --  4.1 3.7 3.8  CL 97 93*  --  94* 94* 93*  CO2 12* 19  --  27 29 26   GLUCOSE 105* 236*  --  193* 206* 231*  BUN 76* 77*  --  82* 90* 94*  CREATININE 4.48* 4.19*  --  3.37* 3.27* 3.26*  CALCIUM 9.2 9.1  --  9.2 9.1 9.4  MG  --   --  1.8  --   --   --   PHOS  --   --   --   --   --  4.6    Liver Function Tests:  Recent Labs Lab 10/10/14 0726 10/11/14 0417 10/12/14 0251 10/13/14 0316 10/15/14 0524  AST 31 26 27 23   --   ALT 23 22 21 21   --   ALKPHOS 111 142* 145* 158*  --   BILITOT 0.6 0.7 0.5 0.3  --   PROT 7.1 6.8 6.8 6.7  --   ALBUMIN 1.8* 1.6* 1.6* 1.6* 1.9*   CBC:  Recent Labs Lab 10/10/14 0726 10/11/14 0417 10/12/14 0251 10/13/14 0316  10/14/14 0450  WBC 21.9* 21.9* 19.9* 15.6* 11.6*  NEUTROABS 19.9*  --   --  14.9*  --   HGB 6.1* 8.9* 8.8* 9.0* 8.8*  HCT 18.9* 26.0* 25.1* 26.6* 25.8*  MCV 80.4 81.8 78.7 78.9 82.7  PLT 232 217 231 256 267    Cardiac Enzymes:  Recent Labs Lab 10/10/14 1900  CKTOTAL 244*   BNP (last 3 results)  Recent Labs  10/05/14 0521  PROBNP 20673.0*    CBG:  Recent Labs Lab 10/14/14 1148 10/14/14 1656 10/14/14 2120 10/15/14 0750 10/15/14 1208  GLUCAP 192* 299* 261* 298* 232*    Recent Results (from the past 240 hour(s))  Urine culture     Status: None   Collection Time: 10/05/14  7:03 PM  Result Value Ref Range Status   Specimen Description URINE, CLEAN CATCH  Final   Special Requests NONE  Final   Culture  Setup Time   Final    10/05/2014 23:02 Performed  at [facility]   Final    30,000 COLONIES/ML Performed at [facility]   Final    Multiple bacterial morphotypes present, none predominant. Suggest appropriate recollection if clinically indicated. Performed at [facility]    Report Status 10/06/2014 FINAL  Final  Culture, blood (routine x 2)     Status: None (Preliminary result)   Collection Time: 10/10/14  9:38 AM  Result Value Ref Range Status   Specimen Description BLOOD LEFT FOREARM  Final   Special Requests BOTTLES DRAWN AEROBIC ONLY 5CC  Final   Culture  Setup Time   Final    10/10/2014 14:39 Performed at [facility]    Culture   Final           BLOOD CULTURE RECEIVED NO GROWTH TO DATE CULTURE WILL BE HELD FOR 5 DAYS BEFORE ISSUING A FINAL NEGATIVE REPORT Performed at [facility]    Report Status PENDING  Incomplete  Culture, blood (routine x 2)     Status: None   Collection Time: 10/10/14  9:44 AM  Result Value Ref Range Status   Specimen Description BLOOD LEFT ARM  Final   Special Requests   Final    BOTTLES DRAWN AEROBIC AND ANAEROBIC 5CC BLUE  4CC RED   Culture  Setup  Time   Final    10/10/2014 14:39 Performed at [facility]    Culture   Final    DIPHTHEROIDS(CORYNEBACTERIUM SPECIES) Note: Standardized susceptibility testing for this organism is not available. Note: Gram Stain Report Called to,Read Back By and Verified With: Alessandra Grout RN [phone] Performed at [facility]    Report Status 10/13/2014 FINAL  Final  MRSA PCR Screening     Status: None   Collection Time: 10/10/14 10:48 AM  Result Value Ref Range Status   MRSA by PCR NEGATIVE NEGATIVE Final    Comment:        The GeneXpert MRSA Assay (FDA approved for NASAL specimens only), is one component of a comprehensive MRSA colonization surveillance program. It is not intended to diagnose MRSA infection nor to guide or monitor treatment for MRSA infections.      Studies:  Recent x-ray studies have been reviewed in detail by the Attending Physician  Scheduled Meds:  Scheduled Meds: . amLODipine  10 mg Oral Daily  . antiseptic oral rinse  7 mL Mouth Rinse BID  . carvedilol  12.5 mg Oral BID WC  . cloNIDine  0.1 mg Oral TID  . ferrous sulfate  325 mg Oral Q breakfast  . hydroxychloroquine  400 mg Oral Daily  . insulin aspart  0-15 Units Subcutaneous TID WC  . multivitamin  1 tablet Oral Daily  . nicotine  14 mg Transdermal Q24H  . pantoprazole  40 mg Oral BID  . predniSONE  60 mg Oral Q breakfast  . sodium bicarbonate  1,300 mg Oral BID  . sodium chloride  3 mL Intravenous Q12H  . Vitamin D (Ergocalciferol)  50,000 Units Oral Q Wed    Time spent on care of this patient: 35 mins   Birdie Hopes , MD   [facility] Office  [phone] Pager - Text Page per Shea Evans as per below:  On-Call/Text Page:      [email or web address]      password [email or web address]  If 7PM-7AM, please contact night-coverage [email or web address] Password [email or web address] 10/15/2014, 12:24 PM   LOS: 5 days

## 2014-10-15 NOTE — Progress Notes (Addendum)
On-call provider notified of patient's BP 175/56 after PM Clonidine dose. Pt complains of no distress at this time. Hydralazine PO ordered due to no IV access, note stated to avoid Hydralazine. Hydralazine DC, additional dose of Clonidine ordered. Will await med from pharmacy and continue to monitor.

## 2014-10-15 NOTE — Progress Notes (Signed)
On-call provider called to notify of SBP 189. No distress noted. Additional dose of Clonidine ordered. Will await med from pharmacy and continue to monitor.

## 2014-10-15 NOTE — Progress Notes (Signed)
Notified on-call provider of BP 177/72 following dose of Clonidine. No distress noted. Will continue to monitor.

## 2014-10-16 LAB — CULTURE, BLOOD (ROUTINE X 2): Culture: NO GROWTH

## 2014-10-16 LAB — BASIC METABOLIC PANEL
ANION GAP: 16 — AB (ref 5–15)
BUN: 93 mg/dL — ABNORMAL HIGH (ref 6–23)
CHLORIDE: 95 meq/L — AB (ref 96–112)
CO2: 26 mEq/L (ref 19–32)
Calcium: 9.5 mg/dL (ref 8.4–10.5)
Creatinine, Ser: 3.37 mg/dL — ABNORMAL HIGH (ref 0.50–1.10)
GFR calc Af Amer: 17 mL/min — ABNORMAL LOW (ref >90)
GFR, EST NON AFRICAN AMERICAN: 15 mL/min — AB (ref >90)
Glucose, Bld: 180 mg/dL — ABNORMAL HIGH (ref 70–99)
POTASSIUM: 3.9 meq/L (ref 3.7–5.3)
SODIUM: 137 meq/L (ref 137–147)

## 2014-10-16 LAB — GLUCOSE, CAPILLARY
Glucose-Capillary: 233 mg/dL — ABNORMAL HIGH (ref 70–99)
Glucose-Capillary: 167 mg/dL — ABNORMAL HIGH (ref 70–99)
Glucose-Capillary: 231 mg/dL — ABNORMAL HIGH (ref 70–99)
Glucose-Capillary: 277 mg/dL — ABNORMAL HIGH (ref 70–99)
Glucose-Capillary: 263 mg/dL — ABNORMAL HIGH (ref 70–99)

## 2014-10-16 MED ORDER — LABETALOL HCL 200 MG PO TABS
200.0000 mg | ORAL_TABLET | Freq: Two times a day (BID) | ORAL | Status: DC
Start: 2014-10-16 — End: 2014-10-17
  Administered 2014-10-16 – 2014-10-17 (×3): 200 mg via ORAL
  Filled 2014-10-16 (×4): qty 1

## 2014-10-16 MED ORDER — FUROSEMIDE 80 MG PO TABS
80.0000 mg | ORAL_TABLET | Freq: Two times a day (BID) | ORAL | Status: DC
Start: 2014-10-16 — End: 2014-10-17
  Administered 2014-10-16 – 2014-10-17 (×2): 80 mg via ORAL
  Filled 2014-10-16 (×4): qty 1

## 2014-10-16 MED ORDER — CLONIDINE HCL 0.1 MG PO TABS
0.1000 mg | ORAL_TABLET | Freq: Once | ORAL | Status: AC
  Administered 2014-10-16: 0.1 mg via ORAL
  Filled 2014-10-16: qty 1

## 2014-10-16 MED ORDER — PREDNISONE 20 MG PO TABS
40.0000 mg | ORAL_TABLET | Freq: Every day | ORAL | Status: DC
  Administered 2014-10-17: 40 mg via ORAL
  Filled 2014-10-16 (×2): qty 2

## 2014-10-16 MED ORDER — FUROSEMIDE 8 MG/ML PO SOLN
40.0000 mg | Freq: Once | ORAL | Status: DC
  Filled 2014-10-16 (×2): qty 5

## 2014-10-16 MED ORDER — FUROSEMIDE 10 MG/ML IJ SOLN
40.0000 mg | Freq: Once | INTRAMUSCULAR | Status: DC
  Filled 2014-10-16: qty 4

## 2014-10-16 MED ORDER — FUROSEMIDE 10 MG/ML PO SOLN
40.0000 mg | Freq: Once | ORAL | Status: AC
  Administered 2014-10-16: 40 mg via ORAL
  Filled 2014-10-16 (×2): qty 4

## 2014-10-16 NOTE — Progress Notes (Signed)
Rehab Admissions Coordinator Note:  Patient was screened by Retta Diones for appropriateness for an Inpatient Acute Rehab Consult. Patient known to me from previous inpatient rehab admission.  I will ask rehab MD if re admit to inpatient rehab would be appropriate.  I will then get back to all.  We would need [employer] approval again if Dr. Naaman Plummer decides to re admit.  I will follow up.  Retta Diones 10/16/2014, 10:43 AM  I can be reached at [phone].

## 2014-10-16 NOTE — H&P (Signed)
Physical Medicine and Rehabilitation Admission H&P   CC: left sided weakness, left inattention, diffuse pain.  HPI: [name] [name] is a 53 y.o. female with history of HTN, hyperlipidemia, lupus with mild LLE weakness as well as compliance issues who was admitted on 09/28/14 with acute onset left sided weakness and numbness as well as gaze to the right side. Patient reported being out of BP meds for 3 weeks and BP 238/88 at admission. UDS negative. 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 Carotid dopplers significant for R-40-59% ICA stenosis. She was treated with IV diuresis for SOB due to fluid overload. Lisinopril was dicontinued due to worsening of renal status and she was treated with IV bicarb for acidosis. Repeat cardiac echo showed EF 60-70% with AV sclerosis without stenosis. FEES done yesterday showing mild dysphagia with pentration due to large boluses but no aspiration therefore she was started on regular textures with thin liquids. She continued to have complaints of neck, back and ankle pain as well as issues with fluctuating bouts of lethargy. Follow up MRI/MRA brain done on 12/03 revealing changes c/w acute/subacute hemorrhage in right parmedian pons and no underlying mass evident. Minimal diffusion abnormality in high frontal lobes bilaterally likely due to calcification and probably meningioma.  Patient with resultant left sided weakness impacting mobility as well as safety.  CIR was recommended by rehab team and patient was admitted for inpatient rehab program on 10/05/14. Heat alternating with ice was added to help with left neck and shoulder pain. Oxycodone was used additionally for ongoing issues with diffuse arthralgias. Blood pressures were monitored on TID basis and were resonably controlled. H/H was monitored with serial checks due to downward  trend. Stool guaiac X 1 was negative but recheck labs on 12/8 showed further drop in H/H to 6.1 with elevated WBC 21.9 as well as hyperkalemia with K- 5.5. She was febrile with T-max 101 was noted to be lethargic with complaints of diffuse pain affecting bilateral knees, feet, elbows as well as neck and feet. Dr. Clementeen Graham was consulted for input and recommended transfer to stepdown due to concerns of sepsis as well as GIB.  EEG was negative for seizure activity.   She was transfused with 2 units PRBC and started on IV antibiotics . Work up without evidence of infection and most consistent with connective disease flare. X rays of bilateral knees, ankles, hands and wrists negative for inflammatory/infectious changes. She was started on high dose steroids per input from ortho. Renal was consulted for input and recommended IV bicarb for metabolic acidosis and no indication to treat mild hyperkalemia. 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 Blood work done revealing ANA +, elevated RA factor, normal complement and elevated levels mycophenolic acid. Rheumatology consult recommended for work up.  Renal ultrasound showed evidence of medical renal disease and no obstructive uropathy. Dr. Amedeo Plenty was consulted for input and recommended conservative management as stools were heme negative and anemia likely due to autoimmune component. H/H has been stable and GI has signed off. Patient has has labile blood pressures as well as increase of > 4 kg in weight therefore lasix was resumed and titrated to bid yesterday per renal input. Dr. Jimmy Footman suspects Cr to rise with better control and can push labatelol or coreg. On plaquenil with recommendations for Opthal evaluation for baseline past discharge. Recommendations for  prednisone taper in a week? As well as follow up with Rheum past discharge.   Arthralgias have improved with resolution of lethargy. Patient has been afebrile with reactive  leucocytosis resolving.  Blood sugars trending high on current dose steriods. Baseline Cr 3.2-3.5. Therapies ongoing and working on pregait activity. She is showing improvement in tolerance of activity and was admitted back to complete CIR program.    Review of Systems  HENT: Negative for hearing loss.   Eyes: Negative for blurred vision and double vision.  Respiratory: Negative for cough, shortness of breath and wheezing.   Cardiovascular: Negative for chest pain and palpitations.  Gastrointestinal: Negative for heartburn, nausea, vomiting and abdominal pain.  Genitourinary: Negative for urgency and frequency.  Musculoskeletal: Positive for myalgias, joint pain (left foot and ankle) and neck pain.  Neurological: Negative for dizziness, tingling and headaches.      Past Medical History  Diagnosis Date  . Hypertension   . Lupus   . CKD (chronic kidney disease)     due to lupus/Dr. [name]  . Diabetes mellitus   . Stenosis of cervical spine region     with HNP at C5/6, C6/7  . Metatarsal bone fracture right    4th  . 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 Arthritis     RA  . Anemia   . Stroke     Past Surgical History  Procedure Laterality Date  . Breast biopsy      Family History  Problem Relation Age of Onset  . Hypertension    . Lupus    . Rheum arthritis    . Hypertension Mother   . Diabetes Mother   . Hypertension Sister   . Diabetes Father   . Hypertension Maternal Grandmother     Social History:  Lives with son and daughter. 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 Has a boyfriend who can also assist past discharge. She reports that she has been smoking Cigarettes--about 1/2 PPD. She has a 15 pack-year smoking history. She does not have any smokeless tobacco history on file. She reports that she drink alcohol couple of times a year. She does not use illicit drugs.    Allergies  Allergen Reactions  . Sulfa  Antibiotics Itching    Medications Prior to Admission  Medication Sig Dispense Refill  . amLODipine (NORVASC) 10 MG tablet take 1 tablet by mouth once daily 30 tablet 0  . Cholecalciferol (VITAMIN D3) 50000 UNITS CAPS Take 1 capsule by mouth 3 (three) times a week. 30 capsule 3  . ferrous sulfate 325 (65 FE) MG tablet Take 325 mg by mouth daily with breakfast.    . multivitamin (RENA-VIT) TABS tablet Take 1 tablet by mouth daily.    . mycophenolate (CELLCEPT) 250 MG capsule Take 1,000 mg by mouth 2 (two) times daily.    . nicotine (NICODERM CQ - DOSED IN MG/24 HOURS) 14 mg/24hr patch Place 1 patch onto the skin daily.    . cyclobenzaprine (FLEXERIL) 5 MG tablet Take 1 tablet (5 mg total) by mouth 3 (three) times daily as needed for muscle spasms. 30 tablet 0    Home: Home Living Family/patient expects to be discharged to:: Inpatient rehab Living Arrangements: Children Available Help at Discharge: Family, Available 24 hours/day Type of Home: House Home Access: Stairs to enter [employer] of Steps: 4 Entrance Stairs-Rails: Right Home Layout: Two level, Able to  live on main level with bedroom/bathroom Home Equipment: Gilford Rile - standard  Lives With: Son, Daughter   Functional History: Prior Function Level of Independence: Independent 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 Hobbies include spending time with family and going out to eat, play candy crush  Functional Status:  Mobility: Bed Mobility Overal bed mobility: Needs Assistance Bed Mobility: Supine to Sit Supine to sit: Min guard Sit to supine: Max assist, +2 for physical assistance General bed mobility comments: Vcs for positioning at EOB Transfers Overall transfer level: Needs assistance Equipment used: Rolling walker (2 wheeled) Transfers: Sit to/from Stand, [employer] Transfers Sit to Stand: Min assist, +2 physical assistance Stand pivot transfers: Mod assist General transfer comment: assist to power to  upright, VCs for hand placement and positioning Ambulation/Gait Ambulation/Gait assistance: Min assist, +2 physical assistance Ambulation Distance (Feet): 60 Feet Assistive device: Rolling walker (2 wheeled) Gait Pattern/deviations: Ataxic, Step-through pattern, Decreased stride length, Drifts right/left, Narrow base of support Gait velocity: decreased Gait velocity interpretation: Below normal speed for age/gender General Gait Details: Gait retraining with tactile facilitation of LE position from heel strike through mid stance with attention to posture and postioning to address hyperextension LLE.  +2 faciliation for hip and trunk control    ADL: ADL Overall ADL's : Needs assistance/impaired Eating/Feeding: Set up, Bed level Grooming: Wash/dry hands, Wash/dry face, Oral care, Minimal assistance, Standing Upper Body Bathing: Minimal assitance, Sitting Lower Body Bathing: Moderate assistance, Sit to/from stand Upper Body Dressing : Minimal assistance, Sitting Lower Body Dressing: Moderate assistance, Sit to/from stand Lower Body Dressing Details (indicate cue type and reason): Pt able to don/doff socks.  Dependent upon UE support for standing  Toilet Transfer: Minimal assistance, +2 for physical assistance Toilet Transfer Details (indicate cue type and reason): facilitation for knee control and hip extension as well as trunk control  Toileting- Clothing Manipulation and Hygiene: Moderate assistance, Sitting/lateral lean Functional mobility during ADLs: Minimal assistance, +2 for physical assistance General ADL Comments: Pt very motivated  Cognition: Cognition Overall Cognitive Status: Within Functional Limits for tasks assessed Orientation Level: Oriented X4 Cognition Arousal/Alertness: Awake/alert Behavior During Therapy: WFL for tasks assessed/performed Overall Cognitive Status: Within Functional Limits for tasks assessed Current Attention Level: Selective Safety/Judgement:  Decreased awareness of safety, Decreased awareness of deficits Awareness: Emergent Problem Solving: Slow processing  Physical Exam: Blood pressure 176/72, pulse 65, temperature 98.3 F (36.8 C), temperature source Oral, resp. rate 18, height 5' 5.5" (1.664 m), weight 70.6 kg (155 lb 10.3 oz), last menstrual period 07/18/2012, SpO2 100 %. Physical Exam  Nursing note and vitals reviewed. Constitutional: She is oriented to person, place, and time. She appears well-developed and well-nourished.  HENT:  Head: Normocephalic and atraumatic.  Right Ear: External ear normal.  Left Ear: External ear normal.  Eyes: Conjunctivae are normal. Pupils are equal, round, and reactive to light.  Neck: Normal range of motion. Neck supple.  Cardiovascular: Normal rate and regular rhythm.   Murmur (systolic murmur) heard. Respiratory: Effort normal and breath sounds normal. No respiratory distress. She has no wheezes. She has no rales.  GI: Soft. Bowel sounds are normal. She exhibits no distension. There is tenderness. There is rebound.  Large area of dependent fluid collection LLQ?   Musculoskeletal: She exhibits no edema or tenderness.  Mild joint pain in hands, feet with palpation. No obvious joint irregularities   Neurological: She is alert and oriented to person, place, and time.  Alert without lethargy. Speech clear. Attending to left without difficulty. Moves  all four. No obvious cranial nerve abnormalities. Strength appears symmetrical. UE: 4/5 deltoid, bicep, tricep to 5/5 HI, LE: 4/5 HF, KE and 5/5 ankles. Good insight and awareness  Skin: Skin is warm and dry.  Psychiatric: She has a normal mood and affect. Her behavior is normal.    Results for orders placed or performed during the hospital encounter of 10/10/14 (from the past 48 hour(s))  Glucose, capillary     Status: Abnormal   Collection Time: 10/14/14 11:48 AM  Result Value Ref Range   Glucose-Capillary 192 (H) 70 - 99 mg/dL  Glucose,  capillary     Status: Abnormal   Collection Time: 10/14/14  4:56 PM  Result Value Ref Range   Glucose-Capillary 299 (H) 70 - 99 mg/dL  Glucose, capillary     Status: Abnormal   Collection Time: 10/14/14  9:20 PM  Result Value Ref Range   Glucose-Capillary 261 (H) 70 - 99 mg/dL  Renal function panel     Status: Abnormal   Collection Time: 10/15/14  5:24 AM  Result Value Ref Range   Sodium 136 (L) 137 - 147 mEq/L   Potassium 3.8 3.7 - 5.3 mEq/L   Chloride 93 (L) 96 - 112 mEq/L   CO2 26 19 - 32 mEq/L   Glucose, Bld 231 (H) 70 - 99 mg/dL   BUN 94 (H) 6 - 23 mg/dL   Creatinine, Ser 3.26 (H) 0.50 - 1.10 mg/dL   Calcium 9.4 8.4 - 10.5 mg/dL   Phosphorus 4.6 2.3 - 4.6 mg/dL   Albumin 1.9 (L) 3.5 - 5.2 g/dL   GFR calc non Af Amer 15 (L) >90 mL/min   GFR calc Af Amer 18 (L) >90 mL/min    Comment: (NOTE) The eGFR has been calculated using the CKD EPI equation. This calculation has not been validated in all clinical situations. eGFR's persistently <90 mL/min signify possible Chronic Kidney Disease.    Anion gap 17 (H) 5 - 15  Glucose, capillary     Status: Abnormal   Collection Time: 10/15/14  7:50 AM  Result Value Ref Range   Glucose-Capillary 298 (H) 70 - 99 mg/dL  Glucose, capillary     Status: Abnormal   Collection Time: 10/15/14 12:08 PM  Result Value Ref Range   Glucose-Capillary 232 (H) 70 - 99 mg/dL  Glucose, capillary     Status: Abnormal   Collection Time: 10/15/14  5:31 PM  Result Value Ref Range   Glucose-Capillary 381 (H) 70 - 99 mg/dL  Glucose, capillary     Status: Abnormal   Collection Time: 10/15/14  9:46 PM  Result Value Ref Range   Glucose-Capillary 324 (H) 70 - 99 mg/dL   Comment 1 Documented in Chart    Comment 2 Notify RN   Glucose, capillary     Status: Abnormal   Collection Time: 10/16/14 12:59 AM  Result Value Ref Range   Glucose-Capillary 233 (H) 70 - 99 mg/dL  Basic metabolic panel     Status: Abnormal   Collection Time: 10/16/14  5:47 AM  Result  Value Ref Range   Sodium 137 137 - 147 mEq/L   Potassium 3.9 3.7 - 5.3 mEq/L   Chloride 95 (L) 96 - 112 mEq/L   CO2 26 19 - 32 mEq/L   Glucose, Bld 180 (H) 70 - 99 mg/dL   BUN 93 (H) 6 - 23 mg/dL   Creatinine, Ser 3.37 (H) 0.50 - 1.10 mg/dL   Calcium 9.5 8.4 - 10.5 mg/dL  GFR calc non Af Amer 15 (L) >90 mL/min   GFR calc Af Amer 17 (L) >90 mL/min    Comment: (NOTE) The eGFR has been calculated using the CKD EPI equation. This calculation has not been validated in all clinical situations. eGFR's persistently <90 mL/min signify possible Chronic Kidney Disease.    Anion gap 16 (H) 5 - 15  Glucose, capillary     Status: Abnormal   Collection Time: 10/16/14  7:50 AM  Result Value Ref Range   Glucose-Capillary 167 (H) 70 - 99 mg/dL   No results found.     Medical Problem List and Plan: 1. 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  2.  DVT Prophylaxis/Anticoagulation: Mechanical:  Antiembolism stockings, knee (TED hose) Bilateral lower extremities Sequential compression devices, below knee Bilateral lower extremities 3. Pain Management: flexeril prn for spasms. rx of connective tissue disorder/rx with prednsione 4. Mood: team to provide ego support as needed. Appears to be in good spirits at present 5. Neuropsych: This patient is capable of making decisions on her own behalf. 6. Skin/Wound Care: ensure appropriate nutrition and skin pressure relief measures 7. Fluids/Electrolytes/Nutrition: encourage PO intake. labwork on admit 8. Acute on chronic renal failure---baseline Cr around 3.3.  -cellcept resumed, prednisone for now 9. Mixed Connective Tissue Disorder/RA-  -pain much better controlled after steroid burst  -outpt rheum follow up  -plaquenil  -prednisone taper to begin next week 10. Likely hemolytic anemia  -continue to monitor blood  counts  -immuno-supressive therapy as above 11. Steroid induced hyperglycemia:  -SSI 12. Malignant hypertension:   -lasix, catapres, labetalol, imdur  -further adjustments as possible      Post Admission Physician Evaluation: 1. Functional deficits secondary  to recent pontine hemorrhage complicated by flare of connective tissue disease/RA 2. Patient is admitted to receive collaborative, interdisciplinary care between the physiatrist, rehab nursing staff, and therapy team. 3. Patient's level of medical complexity and substantial therapy needs in context of that medical necessity cannot be provided at a lesser intensity of care such as a SNF. 4. Patient has experienced substantial functional loss from his/her baseline which was documented above under the "Functional History" and "Functional Status" headings.  Judging by the patient's diagnosis, physical exam, and functional history, the patient has potential for functional progress which will result in measurable gains while on inpatient rehab.  These gains will be of substantial and practical use upon discharge  in facilitating mobility and self-care at the household level. 5. Physiatrist will provide 24 hour management of medical needs as well as oversight of the therapy plan/treatment and provide guidance as appropriate regarding the interaction of the two. 6. 24 hour rehab nursing will assist with bladder management, bowel management, safety, skin/wound care, disease management, medication administration, pain management and patient education  and help integrate therapy concepts, techniques,education, etc. 7. PT will assess and treat for/with: Lower extremity strength, range of motion, stamina, balance, functional mobility, safety, adaptive techniques and equipment, pain mgt, ego support, community reintegration.   Goals are: mod I. 8. OT will assess and treat for/with: ADL's, functional mobility, safety, upper extremity strength, adaptive  techniques and equipment, pain mgt, ego suppoprt, leisure awareness, community reintegration.   Goals are: mod I. Therapy may proceed with showering this patient. 9. SLP will assess and treat for/with: cognition, communication.  Goals are: mod I 10. Case Management and Social Worker will assess and treat for psychological  issues and discharge planning. 11. Team conference will be held weekly to assess progress toward goals and to determine barriers to discharge. 12. Patient will receive at least 3 hours of therapy per day at least 5 days per week. 13. ELOS: 10 days       14. Prognosis:  excellent     Meredith Staggers, MD, [facility] 10/17/2014   10/16/2014

## 2014-10-16 NOTE — Progress Notes (Signed)
Nephrology:       Asked to comment on bp meds/bp.   Wgt is up >4kg since admit.  With low GFR, would start Lasix at 80mg  po bid.  Suspect Cr to rise with better bp control and lower RBF with relatively fixed Renovasc resistance.  Also can push Labetolol or Carvedilol (on previously), but would wait 2-3 d after add Lasix.

## 2014-10-16 NOTE — Progress Notes (Signed)
Progress Note  [name] [name]:[id number] DOB: 01-05-1961 DOA: 10/10/2014 PCP: No primary care provider on file.  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  Workup showed nonsignificant carotid stenosis and 2-D echo noted severe LVH.  During that hospitalization she developed mild pulmonary edema which improved with diuresis. Follow up head CT and MRI brain showed improvement in the size of hemorrhage. Patient was discharged to inpatient rehabilitation for ongoing physical therapy on 12/3.   Patient reported she had not been participating in rehabilitation much due to worsening pain in her knees and ankles. Blood work revealed acute leukocytosis and progressive drop in H&H (11>9>8>6.1). Stool for occult blood done was negative. Chemistries noted anion gap metabolic acidosis with hyperkalemia and worsening renal function. Reportedly patient had poor by mouth intake. Patient had been reporting pain in her neck, worsened wrists, elbows, bilateral knee and ankle pain.   HPI/Subjective: Await recommendation from CIR. Blood pressure still elevated, adjusted the medications.  Assessment/Plan:  SIRS No evidence of active infection - most c/w connective tissue disease flair - SIRS physiology improving - stop abx and follow. Continue prednisone. Start PT/OT.  Diptheroids in 1/2 blood cx Most c/w contaminant - no indication for abx tx for this issue. No evidence of infection.   Essential hypertension BP trending up, her Lisinopril discontinued because of the worsening renal function. Currently on amlodipine 10 mg, Coreg 25 mg, clonidine 0.1 mg and Imdur 60 mg. ACEI/ARB cannot be started because of  worsening renal function, try to avoid hydralazine because of lupus. Blood pressure still in the high side, discontinue Coreg and restarted labetalol.  Acute anemia progressive drop in her Hgb from 11.7 to 6.1 - stool occult blood negative - transfused 2 units PRBC - Hgb improved accordingly - follow trend - Fe studies c/w ACD  Right pontine hemorrhage with acute encephalopathy and ? New RUE weakness acute encephalopathy and right upper extremity weakness seems new - had residual left upper extremity weakness during recent hospitalization - follow-up MRI brain w/o acute findings - delay PT/OT until joints cool off a bit   Acute renal failure superimposed on stage 4 chronic kidney disease acute worsening of her underlying chronic kidney disease - baseline appears to be 3.2-3.5 - Lasix and ACE inhibitor discontinued - on CellCept for her underlying lupus so check level - Nephrology following - no obstruction on renal US This is improving, creatinine is 3.27 today.  Hyperkalemia This is resolved.  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 [name] as outpt until one year ago - holding CellCept secondary to acute renal failure - patient both RF and CCP positive which is a bad prognostic sign - continue prednisone 60 mg daily - patient needs to be on DMARD - considering patient's renal function Dr. [name] started Plaquenil - on discharge patient will need ophthalmology appointment to obtain baseline and f/u appointment w/  Rheumatologist   Tobacco abuse Counseled on sequela of continued tobacco abuse to  include increased risk of stroke, MI, death  Code Status: FULL Family Communication: no family present at time of exam Disposition Plan: transfer to med bed - follow Hgb and renal fxn - PT/OT to help determine venue for d/c   Consultants: Ortho GI Nephrology  Procedures: EEG - 12/8 - normal   Antibiotics: Zosyn 12/8 Vanc 12/8 + 12/10  DVT prophylaxis: SCDs  Objective: Blood pressure 176/72, pulse 65, temperature 98.3 F (36.8 C), temperature source Oral, resp. rate 18, height 5' 5.5" (1.664 m), weight 70.6 kg (155 lb 10.3 oz), last menstrual period 07/18/2012, SpO2 100 %.  Intake/Output Summary (Last 24 hours) at 10/16/14 1307 Last data filed at 10/16/14 0943  Gross per 24 hour  Intake    120 ml  Output    460 ml  Net   -340 ml   Exam: General: No acute respiratory distress - alert and conversant  Lungs: Clear to auscultation bilaterally without wheezes or crackles Cardiovascular: Regular rhythm - without murmur gallop or rub normal S1 and S2 Abdomen: Nontender, nondistended, soft, bowel sounds positive, no rebound, no ascites, no appreciable mass Extremities: No significant cyanosis, clubbing, or edema bilateral lower extremities  Data Reviewed: Basic Metabolic Panel:  Recent Labs Lab 10/12/14 0251 10/12/14 2230 10/13/14 0316 10/14/14 0450 10/15/14 0524 10/16/14 0547  NA 131*  --  139 140 136* 137  K 4.5  --  4.1 3.7 3.8 3.9  CL 93*  --  94* 94* 93* 95*  CO2 19  --  27 29 26 26   GLUCOSE 236*  --  193* 206* 231* 180*  BUN 77*  --  82* 90* 94* 93*  CREATININE 4.19*  --  3.37* 3.27* 3.26* 3.37*  CALCIUM 9.1  --  9.2 9.1 9.4 9.5  MG  --  1.8  --   --   --   --   PHOS  --   --   --   --  4.6  --     Liver Function Tests:  Recent Labs Lab 10/10/14 0726 10/11/14 0417 10/12/14 0251 10/13/14 0316 10/15/14 0524  AST 31 26 27 23   --   ALT 23 22 21 21   --   ALKPHOS 111 142* 145* 158*  --   BILITOT 0.6 0.7 0.5 0.3  --   PROT 7.1 6.8 6.8 6.7  --     ALBUMIN 1.8* 1.6* 1.6* 1.6* 1.9*   CBC:  Recent Labs Lab 10/10/14 0726 10/11/14 0417 10/12/14 0251 10/13/14 0316 10/14/14 0450  WBC 21.9* 21.9* 19.9* 15.6* 11.6*  NEUTROABS 19.9*  --   --  14.9*  --   HGB 6.1* 8.9* 8.8* 9.0* 8.8*  HCT 18.9* 26.0* 25.1* 26.6* 25.8*  MCV 80.4 81.8 78.7 78.9 82.7  PLT 232 217 231 256 267    Cardiac Enzymes:  Recent Labs Lab 10/10/14 1900  CKTOTAL 244*   BNP (last 3 results)  Recent Labs  10/05/14 0521  PROBNP 20673.0*    CBG:  Recent Labs Lab 10/15/14 1731 10/15/14 2146 10/16/14 0059 10/16/14 0750 10/16/14 1200  GLUCAP 381* 324* 233* 167* 231*    Recent Results (from the past 240 hour(s))  Culture, blood (routine x 2)     Status: None   Collection Time: 10/10/14  9:38 AM  Result Value Ref Range Status   Specimen Description BLOOD LEFT FOREARM  Final   Special Requests BOTTLES DRAWN AEROBIC ONLY  [facility]  Final   Culture  Setup Time   Final    10/10/2014 14:39 Performed at [facility]    Culture   Final    NO GROWTH 5 DAYS Performed at [facility]    Report Status 10/16/2014 FINAL  Final  Culture, blood (routine x 2)     Status: None   Collection Time: 10/10/14  9:44 AM  Result Value Ref Range Status   Specimen Description BLOOD LEFT ARM  Final   Special Requests   Final    BOTTLES DRAWN AEROBIC AND ANAEROBIC 5CC BLUE  4CC RED   Culture  Setup Time   Final    10/10/2014 14:39 Performed at [facility]    Culture   Final    DIPHTHEROIDS(CORYNEBACTERIUM SPECIES) Note: Standardized susceptibility testing for this organism is not available. Note: Gram Stain Report Called to,Read Back By and Verified With: Alessandra Grout RN [phone] Performed at [facility]    Report Status 10/13/2014 FINAL  Final  MRSA PCR Screening     Status: None   Collection Time: 10/10/14 10:48 AM  Result Value Ref Range Status   MRSA by PCR NEGATIVE NEGATIVE Final    Comment:        The GeneXpert MRSA Assay  (FDA approved for NASAL specimens only), is one component of a comprehensive MRSA colonization surveillance program. It is not intended to diagnose MRSA infection nor to guide or monitor treatment for MRSA infections.      Studies:  Recent x-ray studies have been reviewed in detail by the Attending Physician  Scheduled Meds:  Scheduled Meds: . amLODipine  10 mg Oral Daily  . antiseptic oral rinse  7 mL Mouth Rinse BID  . cloNIDine  0.1 mg Oral TID  . ferrous sulfate  325 mg Oral Q breakfast  . hydroxychloroquine  400 mg Oral Daily  . insulin aspart  0-15 Units Subcutaneous TID WC  . insulin aspart  0-5 Units Subcutaneous QHS  . isosorbide mononitrate  60 mg Oral Daily  . labetalol  200 mg Oral BID  . multivitamin  1 tablet Oral Daily  . nicotine  14 mg Transdermal Q24H  . pantoprazole  40 mg Oral BID  . [START ON 10/17/2014] predniSONE  40 mg Oral Q breakfast  . sodium bicarbonate  1,300 mg Oral BID  . sodium chloride  3 mL Intravenous Q12H  . Vitamin D (Ergocalciferol)  50,000 Units Oral Q Wed    Time spent on care of this patient: 35 mins   Birdie Hopes , MD   [facility] Office  [phone] Pager - Text Page per Shea Evans as per below:  On-Call/Text Page:      [email or web address]      password [email or web address]  If 7PM-7AM, please contact night-coverage [email or web address] Password [email or web address] 10/16/2014, 1:07 PM   LOS: 6 days

## 2014-10-16 NOTE — Progress Notes (Signed)
Physical Therapy Treatment Patient Details Name: [name] [name] MRN: [id number] DOB: 1961-04-04 Today's Date: 10/16/2014    History of Present Illness 53 y/o black female with complicated history with concern for sepsis and polyarthralgias/myalgias.  Recently was at [facility] following admission with acute pontine hemorrhagic infarct. PMH positive for hyperlipidemia, HTN and compliance issues.    PT Comments    Patient making progress toward goals.  Agree with need for Inpatient Rehab stay at discharge to maximize functional mobility.  Follow Up Recommendations  CIR     Equipment Recommendations  Rolling walker with 5" wheels    Recommendations for Other Services Rehab consult     Precautions / Restrictions Precautions Precautions: Fall Restrictions Weight Bearing Restrictions: No    Mobility  Bed Mobility Overal bed mobility: Needs Assistance Bed Mobility: Supine to Sit;Sit to Supine     Supine to sit: Min guard Sit to supine: Min guard   General bed mobility comments: Verbal cues for technique, and for symmetrical posture at EOB  Transfers Overall transfer level: Needs assistance Equipment used: None Transfers: Sit to/from Stand Sit to Stand: Min assist         General transfer comment: Verbal cues for hand placement.  Worked on partial sit > stand, focusing on midline transition - patient shifts weight to RLE during transition. PT sitting in front of patient, and patient putting hands on PT's shoulders. Patient then worked in standing on weight shifting to LLE, Lt knee control in stance, shallow knee bends, and midline stance posture.  Pre-gait activities of shifting weight to LLE and stepping forward/back with RLE.  Ambulation/Gait                 Stairs            Wheelchair Mobility    Modified Rankin (Stroke Patients Only)       Balance           Standing balance support: Bilateral upper extremity supported Standing balance-Leahy  Scale: Poor Standing balance comment: Patient tends to shift weight to RLE.  Focused on weight shifting to LLE and Lt knee control.                    Cognition Arousal/Alertness: Awake/alert Behavior During Therapy: WFL for tasks assessed/performed Overall Cognitive Status: Within Functional Limits for tasks assessed                      Exercises      General Comments        Pertinent Vitals/Pain Pain Assessment: No/denies pain    Home Living                      Prior Function            PT Goals (current goals can now be found in the care plan section) Progress towards PT goals: Progressing toward goals    Frequency  Min 3X/week    PT Plan Current plan remains appropriate    Co-evaluation             End of Session Equipment Utilized During Treatment: Gait belt Activity Tolerance: Patient tolerated treatment well;Patient limited by fatigue Patient left: in bed;with call bell/phone within reach;with bed alarm set     Time: 1607-3710 PT Time Calculation (min) (ACUTE ONLY): 23 min  Charges:  $Therapeutic Activity: 23-37 mins  G Codes:      Despina Pole 10/16/2014, 3:21 PM Carita Pian Sanjuana Kava, [place] Pager [phone]

## 2014-10-17 ENCOUNTER — Inpatient Hospital Stay (HOSPITAL_COMMUNITY)
Admission: RE | Admit: 2014-10-17 | Discharge: 2014-10-25 | DRG: 065 | Disposition: A | Discharge status: Home Health | Payer: Managed Care, Other (non HMO) | Source: Intra-hospital | Attending: Physical Medicine & Rehabilitation | Admitting: Physical Medicine & Rehabilitation

## 2014-10-17 DIAGNOSIS — M069 Rheumatoid arthritis, unspecified: Secondary | ICD-10-CM | POA: Diagnosis present

## 2014-10-17 DIAGNOSIS — M351 Other overlap syndromes: Secondary | ICD-10-CM | POA: Diagnosis present

## 2014-10-17 DIAGNOSIS — N184 Chronic kidney disease, stage 4 (severe): Secondary | ICD-10-CM | POA: Diagnosis present

## 2014-10-17 DIAGNOSIS — M358 Other specified systemic involvement of connective tissue: Secondary | ICD-10-CM

## 2014-10-17 DIAGNOSIS — I129 Hypertensive chronic kidney disease with stage 1 through stage 4 chronic kidney disease, or unspecified chronic kidney disease: Secondary | ICD-10-CM | POA: Diagnosis present

## 2014-10-17 DIAGNOSIS — I613 Nontraumatic intracerebral hemorrhage in brain stem: Principal | ICD-10-CM | POA: Diagnosis present

## 2014-10-17 DIAGNOSIS — R252 Cramp and spasm: Secondary | ICD-10-CM | POA: Diagnosis present

## 2014-10-17 DIAGNOSIS — E1165 Type 2 diabetes mellitus with hyperglycemia: Secondary | ICD-10-CM | POA: Diagnosis present

## 2014-10-17 DIAGNOSIS — D589 Hereditary hemolytic anemia, unspecified: Secondary | ICD-10-CM | POA: Diagnosis present

## 2014-10-17 DIAGNOSIS — N189 Chronic kidney disease, unspecified: Secondary | ICD-10-CM | POA: Diagnosis present

## 2014-10-17 DIAGNOSIS — T380X5A Adverse effect of glucocorticoids and synthetic analogues, initial encounter: Secondary | ICD-10-CM | POA: Diagnosis present

## 2014-10-17 DIAGNOSIS — N179 Acute kidney failure, unspecified: Secondary | ICD-10-CM | POA: Diagnosis present

## 2014-10-17 DIAGNOSIS — I10 Essential (primary) hypertension: Secondary | ICD-10-CM | POA: Diagnosis present

## 2014-10-17 LAB — GLUCOSE, CAPILLARY
GLUCOSE-CAPILLARY: 246 mg/dL — AB (ref 70–99)
GLUCOSE-CAPILLARY: 323 mg/dL — AB (ref 70–99)
GLUCOSE-CAPILLARY: 210 mg/dL — AB (ref 70–99)
Glucose-Capillary: 169 mg/dL — ABNORMAL HIGH (ref 70–99)
Glucose-Capillary: 264 mg/dL — ABNORMAL HIGH (ref 70–99)

## 2014-10-17 LAB — MYCOPHENOLIC ACID (CELLCEPT)
MPA Glucuronide: 262 ug/mL — ABNORMAL HIGH (ref 35.0–100.0)
MPA: 1.3 ug/mL (ref 1.0–3.5)

## 2014-10-17 MED ORDER — PREDNISONE 20 MG PO TABS
20.0000 mg | ORAL_TABLET | Freq: Every day | ORAL | Status: DC
  Filled 2014-10-17: qty 1

## 2014-10-17 MED ORDER — MENTHOL 3 MG MT LOZG
1.0000 | LOZENGE | OROMUCOSAL | Status: DC | PRN
  Filled 2014-10-17: qty 9

## 2014-10-17 MED ORDER — DICLOFENAC SODIUM 1 % TD GEL
2.0000 g | Freq: Four times a day (QID) | TRANSDERMAL | Status: DC
  Administered 2014-10-17 – 2014-10-24 (×15): 2 g via TOPICAL
  Filled 2014-10-17: qty 100

## 2014-10-17 MED ORDER — VITAMIN D3 25 MCG (1000 UNIT) PO TABS: 5000.0000 [IU] | ORAL_TABLET | ORAL | Status: DC

## 2014-10-17 MED ORDER — AMLODIPINE BESYLATE 10 MG PO TABS: 10.0000 mg | ORAL_TABLET | Freq: Every day | ORAL | Status: DC

## 2014-10-17 MED ORDER — FUROSEMIDE 80 MG PO TABS: 80.0000 mg | ORAL_TABLET | Freq: Two times a day (BID) | ORAL | Status: DC

## 2014-10-17 MED ORDER — GUAIFENESIN-DM 100-10 MG/5ML PO SYRP: 5.0000 mL | ORAL_SOLUTION | Freq: Four times a day (QID) | ORAL | Status: DC | PRN

## 2014-10-17 MED ORDER — CAMPHOR-MENTHOL 0.5-0.5 % EX LOTN
TOPICAL_LOTION | CUTANEOUS | Status: DC | PRN
  Filled 2014-10-17: qty 222

## 2014-10-17 MED ORDER — PREDNISONE 50 MG PO TABS
50.0000 mg | ORAL_TABLET | Freq: Every day | ORAL | Status: AC
  Administered 2014-10-18 – 2014-10-19 (×2): 50 mg via ORAL
  Filled 2014-10-17 (×3): qty 1

## 2014-10-17 MED ORDER — FUROSEMIDE 80 MG PO TABS
80.0000 mg | ORAL_TABLET | Freq: Two times a day (BID) | ORAL | Status: DC
  Administered 2014-10-17 – 2014-10-25 (×16): 80 mg via ORAL
  Filled 2014-10-17 (×19): qty 1

## 2014-10-17 MED ORDER — ONDANSETRON HCL 4 MG PO TABS: 4.0000 mg | ORAL_TABLET | Freq: Three times a day (TID) | ORAL | Status: DC | PRN

## 2014-10-17 MED ORDER — ALPRAZOLAM 0.5 MG PO TABS
0.5000 mg | ORAL_TABLET | Freq: Four times a day (QID) | ORAL | Status: DC | PRN
  Administered 2014-10-17 – 2014-10-25 (×2): 0.5 mg via ORAL
  Filled 2014-10-17 (×3): qty 1

## 2014-10-17 MED ORDER — TROLAMINE SALICYLATE 10 % EX CREA
TOPICAL_CREAM | Freq: Three times a day (TID) | CUTANEOUS | Status: DC
  Filled 2014-10-17: qty 85

## 2014-10-17 MED ORDER — ADULT MULTIVITAMIN W/MINERALS CH: 1.0000 | ORAL_TABLET | Freq: Every day | ORAL | Status: DC

## 2014-10-17 MED ORDER — FERROUS SULFATE 325 (65 FE) MG PO TABS
325.0000 mg | ORAL_TABLET | Freq: Every day | ORAL | Status: DC
Start: 2014-10-18 — End: 2014-10-25
  Administered 2014-10-18 – 2014-10-25 (×8): 325 mg via ORAL
  Filled 2014-10-17 (×10): qty 1

## 2014-10-17 MED ORDER — CLONIDINE HCL 0.1 MG PO TABS
0.1000 mg | ORAL_TABLET | Freq: Three times a day (TID) | ORAL | Status: DC
  Administered 2014-10-17 – 2014-10-18 (×2): 0.1 mg via ORAL
  Filled 2014-10-17 (×5): qty 1

## 2014-10-17 MED ORDER — CLONIDINE HCL 0.1 MG PO TABS: 0.1000 mg | ORAL_TABLET | Freq: Three times a day (TID) | ORAL | Status: DC

## 2014-10-17 MED ORDER — ONDANSETRON HCL 4 MG PO TABS: 4.0000 mg | ORAL_TABLET | Freq: Four times a day (QID) | ORAL | Status: DC | PRN

## 2014-10-17 MED ORDER — LABETALOL HCL 200 MG PO TABS
400.0000 mg | ORAL_TABLET | Freq: Two times a day (BID) | ORAL | Status: DC
  Administered 2014-10-17 – 2014-10-19 (×4): 400 mg via ORAL
  Filled 2014-10-17 (×6): qty 2

## 2014-10-17 MED ORDER — POLYETHYLENE GLYCOL 3350 17 G PO PACK: 17.0000 g | PACK | Freq: Every day | ORAL | Status: DC | PRN

## 2014-10-17 MED ORDER — MYCOPHENOLATE MOFETIL 250 MG PO CAPS
1000.0000 mg | ORAL_CAPSULE | Freq: Two times a day (BID) | ORAL | Status: DC
  Administered 2014-10-17 – 2014-10-25 (×16): 1000 mg via ORAL
  Filled 2014-10-17 (×19): qty 4

## 2014-10-17 MED ORDER — MUSCLE RUB 10-15 % EX CREA
1.0000 "application " | TOPICAL_CREAM | Freq: Three times a day (TID) | CUTANEOUS | Status: DC
  Administered 2014-10-18 – 2014-10-25 (×8): 1 via TOPICAL
  Filled 2014-10-17: qty 85

## 2014-10-17 MED ORDER — NICOTINE 7 MG/24HR TD PT24: 7.0000 mg | MEDICATED_PATCH | Freq: Every day | TRANSDERMAL | Status: DC

## 2014-10-17 MED ORDER — DICLOFENAC SODIUM 1 % TD GEL: 2.0000 g | Freq: Four times a day (QID) | TRANSDERMAL | Status: DC

## 2014-10-17 MED ORDER — PANTOPRAZOLE SODIUM 40 MG PO TBEC
40.0000 mg | DELAYED_RELEASE_TABLET | Freq: Two times a day (BID) | ORAL | Status: DC
  Administered 2014-10-17 – 2014-10-25 (×16): 40 mg via ORAL
  Filled 2014-10-17 (×14): qty 1

## 2014-10-17 MED ORDER — DIPHENHYDRAMINE HCL 12.5 MG/5ML PO ELIX: 12.5000 mg | ORAL_SOLUTION | Freq: Four times a day (QID) | ORAL | Status: DC | PRN

## 2014-10-17 MED ORDER — FERROUS SULFATE 325 (65 FE) MG PO TABS: 325.0000 mg | ORAL_TABLET | Freq: Every day | ORAL | Status: DC

## 2014-10-17 MED ORDER — CYCLOBENZAPRINE HCL 10 MG PO TABS: 5.0000 mg | ORAL_TABLET | Freq: Three times a day (TID) | ORAL | Status: DC | PRN

## 2014-10-17 MED ORDER — ACETAMINOPHEN 325 MG PO TABS: 650.0000 mg | ORAL_TABLET | ORAL | Status: DC | PRN

## 2014-10-17 MED ORDER — FLEET ENEMA 7-19 GM/118ML RE ENEM: 1.0000 | ENEMA | Freq: Once | RECTAL | Status: AC | PRN

## 2014-10-17 MED ORDER — CETYLPYRIDINIUM CHLORIDE 0.05 % MT LIQD
7.0000 mL | Freq: Two times a day (BID) | OROMUCOSAL | Status: DC
  Administered 2014-10-17 – 2014-10-25 (×14): 7 mL via OROMUCOSAL

## 2014-10-17 MED ORDER — AMLODIPINE BESYLATE 10 MG PO TABS
10.0000 mg | ORAL_TABLET | Freq: Every day | ORAL | Status: DC
  Administered 2014-10-18: 10 mg via ORAL
  Filled 2014-10-17 (×4): qty 1

## 2014-10-17 MED ORDER — HYDROXYCHLOROQUINE SULFATE 200 MG PO TABS
400.0000 mg | ORAL_TABLET | Freq: Every day | ORAL | Status: DC
  Administered 2014-10-18 – 2014-10-25 (×8): 400 mg via ORAL
  Filled 2014-10-17 (×10): qty 2

## 2014-10-17 MED ORDER — PREDNISONE 20 MG PO TABS
30.0000 mg | ORAL_TABLET | Freq: Every day | ORAL | Status: AC
  Administered 2014-10-23 – 2014-10-25 (×3): 30 mg via ORAL
  Filled 2014-10-17 (×4): qty 1

## 2014-10-17 MED ORDER — RENA-VITE PO TABS
1.0000 | ORAL_TABLET | Freq: Every day | ORAL | Status: DC
  Administered 2014-10-18 – 2014-10-25 (×8): 1 via ORAL
  Filled 2014-10-17 (×10): qty 1

## 2014-10-17 MED ORDER — INSULIN ASPART 100 UNIT/ML ~~LOC~~ SOLN
0.0000 [IU] | Freq: Every day | SUBCUTANEOUS | Status: DC
  Administered 2014-10-17: 2 [IU] via SUBCUTANEOUS
  Administered 2014-10-19 (×2): 3 [IU] via SUBCUTANEOUS
  Administered 2014-10-20 – 2014-10-21 (×2): 2 [IU] via SUBCUTANEOUS
  Administered 2014-10-23 – 2014-10-24 (×2): 3 [IU] via SUBCUTANEOUS

## 2014-10-17 MED ORDER — PREDNISONE 20 MG PO TABS
40.0000 mg | ORAL_TABLET | Freq: Every day | ORAL | Status: AC
  Administered 2014-10-20 – 2014-10-22 (×3): 40 mg via ORAL
  Filled 2014-10-17 (×3): qty 2

## 2014-10-17 MED ORDER — CLONIDINE HCL 0.1 MG PO TABS
0.1000 mg | ORAL_TABLET | Freq: Once | ORAL | Status: AC
Start: 2014-10-17 — End: 2014-10-17
  Administered 2014-10-17: 0.1 mg via ORAL
  Filled 2014-10-17: qty 1

## 2014-10-17 MED ORDER — ONDANSETRON HCL 4 MG/2ML IJ SOLN: 4.0000 mg | Freq: Four times a day (QID) | INTRAMUSCULAR | Status: DC | PRN

## 2014-10-17 MED ORDER — PREDNISONE 5 MG PO TABS: 5.0000 mg | ORAL_TABLET | Freq: Every day | ORAL | Status: DC

## 2014-10-17 MED ORDER — VITAMIN D (ERGOCALCIFEROL) 1.25 MG (50000 UNIT) PO CAPS
50000.0000 [IU] | ORAL_CAPSULE | ORAL | Status: DC
  Administered 2014-10-18 – 2014-10-25 (×2): 50000 [IU] via ORAL
  Filled 2014-10-17 (×2): qty 1

## 2014-10-17 MED ORDER — SODIUM BICARBONATE 650 MG PO TABS
1300.0000 mg | ORAL_TABLET | Freq: Two times a day (BID) | ORAL | Status: DC
  Administered 2014-10-17 – 2014-10-25 (×16): 1300 mg via ORAL
  Filled 2014-10-17 (×18): qty 2

## 2014-10-17 MED ORDER — LABETALOL HCL 200 MG PO TABS: 200.0000 mg | ORAL_TABLET | Freq: Two times a day (BID) | ORAL | Status: DC

## 2014-10-17 MED ORDER — SENNOSIDES-DOCUSATE SODIUM 8.6-50 MG PO TABS
1.0000 | ORAL_TABLET | Freq: Two times a day (BID) | ORAL | Status: DC
  Administered 2014-10-17 – 2014-10-25 (×16): 1 via ORAL
  Filled 2014-10-17 (×15): qty 1

## 2014-10-17 MED ORDER — TRAZODONE HCL 50 MG PO TABS
25.0000 mg | ORAL_TABLET | Freq: Every evening | ORAL | Status: DC | PRN
  Filled 2014-10-17: qty 1

## 2014-10-17 MED ORDER — PREDNISONE 20 MG PO TABS: 40.0000 mg | ORAL_TABLET | Freq: Every day | ORAL | Status: DC

## 2014-10-17 MED ORDER — NICOTINE 14 MG/24HR TD PT24
14.0000 mg | MEDICATED_PATCH | TRANSDERMAL | Status: DC
  Administered 2014-10-18 – 2014-10-24 (×7): 14 mg via TRANSDERMAL
  Filled 2014-10-17 (×8): qty 1

## 2014-10-17 MED ORDER — SODIUM CHLORIDE 0.9 % IV SOLN: INTRAVENOUS | Status: DC

## 2014-10-17 MED ORDER — OXYCODONE HCL 5 MG PO TABS: 10.0000 mg | ORAL_TABLET | ORAL | Status: DC | PRN

## 2014-10-17 MED ORDER — PREDNISONE 10 MG PO TABS: 10.0000 mg | ORAL_TABLET | Freq: Every day | ORAL | Status: DC

## 2014-10-17 MED ORDER — ENSURE COMPLETE PO LIQD
237.0000 mL | Freq: Two times a day (BID) | ORAL | Status: DC
  Administered 2014-10-18 – 2014-10-25 (×14): 237 mL via ORAL

## 2014-10-17 MED ORDER — INSULIN ASPART 100 UNIT/ML ~~LOC~~ SOLN
0.0000 [IU] | Freq: Three times a day (TID) | SUBCUTANEOUS | Status: DC
  Administered 2014-10-17: 11 [IU] via SUBCUTANEOUS
  Administered 2014-10-18: 5 [IU] via SUBCUTANEOUS
  Administered 2014-10-18 (×2): 3 [IU] via SUBCUTANEOUS
  Administered 2014-10-19: 5 [IU] via SUBCUTANEOUS
  Administered 2014-10-19: 3 [IU] via SUBCUTANEOUS
  Administered 2014-10-19: 11 [IU] via SUBCUTANEOUS
  Administered 2014-10-20 (×2): 2 [IU] via SUBCUTANEOUS
  Administered 2014-10-20: 3 [IU] via SUBCUTANEOUS
  Administered 2014-10-21: 2 [IU] via SUBCUTANEOUS
  Administered 2014-10-21: 5 [IU] via SUBCUTANEOUS
  Administered 2014-10-22: 11 [IU] via SUBCUTANEOUS
  Administered 2014-10-22 – 2014-10-23 (×3): 2 [IU] via SUBCUTANEOUS
  Administered 2014-10-23: 11 [IU] via SUBCUTANEOUS
  Administered 2014-10-24: 3 [IU] via SUBCUTANEOUS
  Administered 2014-10-24 – 2014-10-25 (×2): 2 [IU] via SUBCUTANEOUS

## 2014-10-17 MED ORDER — ALUM & MAG HYDROXIDE-SIMETH 200-200-20 MG/5ML PO SUSP: 30.0000 mL | ORAL | Status: DC | PRN

## 2014-10-17 MED ORDER — ALUMINUM HYDROXIDE GEL 320 MG/5ML PO SUSP: 30.0000 mL | Freq: Four times a day (QID) | ORAL | Status: DC | PRN

## 2014-10-17 MED ORDER — OXYCODONE HCL 5 MG PO TABS: 5.0000 mg | ORAL_TABLET | ORAL | Status: DC | PRN

## 2014-10-17 MED ORDER — POLYETHYLENE GLYCOL 3350 17 G PO PACK
17.0000 g | PACK | Freq: Every day | ORAL | Status: DC | PRN
  Filled 2014-10-17: qty 1

## 2014-10-17 MED ORDER — BISACODYL 10 MG RE SUPP: 10.0000 mg | Freq: Every day | RECTAL | Status: DC | PRN

## 2014-10-17 MED ORDER — ISOSORBIDE MONONITRATE ER 60 MG PO TB24
60.0000 mg | ORAL_TABLET | Freq: Every day | ORAL | Status: DC
  Administered 2014-10-18 – 2014-10-25 (×8): 60 mg via ORAL
  Filled 2014-10-17 (×11): qty 1

## 2014-10-17 MED ORDER — ATORVASTATIN CALCIUM 20 MG PO TABS
20.0000 mg | ORAL_TABLET | Freq: Every day | ORAL | Status: DC
  Administered 2014-10-17 – 2014-10-24 (×8): 20 mg via ORAL
  Filled 2014-10-17 (×9): qty 1

## 2014-10-17 MED ORDER — INSULIN ASPART 100 UNIT/ML ~~LOC~~ SOLN: 0.0000 [IU] | Freq: Three times a day (TID) | SUBCUTANEOUS | Status: DC

## 2014-10-17 NOTE — Care Management Note (Signed)
    Page 1 of 1   10/17/2014     2:36:15 PM CARE MANAGEMENT NOTE 10/17/2014  Patient:  [name] [name], [name] [name]   Account Number:  [id number]  Date Initiated:  10/12/2014  Documentation initiated by:  MAYO,HENRIETTA  Subjective/Objective Assessment:   dx sepsis; admitted from IP rehab     Action/Plan:   Anticipated DC Date:  10/17/2014   Anticipated DC Plan:  IP REHAB FACILITY  In-house referral  Clinical Social Worker      DC Forensic scientist  CM consult      PAC Choice  IP REHAB   Choice offered to / List presented to:  C-1 Patient           Status of service:  Completed, signed off Medicare Important Message given?  NO (If response is "NO", the following Medicare IM given date fields will be blank) Date Medicare IM given:   Medicare IM given by:   Date Additional Medicare IM given:   Additional Medicare IM given by:    Discharge Disposition:  IP REHAB FACILITY  Per UR Regulation:  Reviewed for med. necessity/level of care/duration of stay  If discussed at [place] of Stay Meetings, dates discussed:    Comments:  10/17/14 [place], BSN [phone] patient is for dc to CIR today, also NCM faxed paer work over to Dr. Elmon Else office for a f/u for RA and Lupsu per Dr. Hartford Poli.  Crystal at Dr. Elmon Else office states they will contact patient at home to schedule an appt.  10/12/14 [place] MSN BSN CCM Received request to assist pt with rheumatology referral. Provided list of rheumatologists in [place].  Pt will review list with sister.

## 2014-10-17 NOTE — Progress Notes (Signed)
Inpatient Diabetes Program Recommendations  AACE/ADA: New Consensus Statement on Inpatient Glycemic Control (2013)  Target Ranges:  Prepandial:   less than 140 mg/dL      Peak postprandial:   less than 180 mg/dL (1-2 hours)      Critically ill patients:  140 - 180 mg/dL   Glucose remains in 200 range despite increase in correction to moderate tidwc and HS scales. Prednisone, any dose greater than 10 mg tends to effect the post-prandial cbg's. Pt would benefit from addition of novolog meal coverage per below. Inpatient Diabetes Program Recommendations Insulin - Basal: xxxxxxxxxxxxxx Insulin - Meal Coverage: In addition to correction insulin, please consider addition of meal coverage novolog at 3 units tidwc until prednisone is titrated down to  5-10 units daily  Thank you, Rosita Kea, RN, CNS, Diabetes Coordinator [phone])

## 2014-10-17 NOTE — Progress Notes (Signed)
Rehab admissions - I am opening the case with [name] [name] this am requesting acute inpatient rehab admission.  I should be able to get patient into inpatient rehab today.  I will let all know once I hear back from insurance case manager.  Call me for questions.  #[phone]

## 2014-10-17 NOTE — Progress Notes (Signed)
Physical Medicine and Rehabilitation Consult  Reason for Consult: Left sided weakness, high level cognitive deficits and left inattention.  Referring Physician: Dr. Leonie Man.    HPI: [name] [name] is a 53 y.o. female with history of HTN, hyperlipidemia, lupus with mild LLE weakness as well as compliance issues who was admitted on 09/28/14 with acute onset left sided weakness and numbness as well as gaze to the right side. Patient reported being out of BP meds for 3 weeks and BP 238/88 at admission. UDS negative. 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 Carotid dopplers significant for R-40-59% ICA stenosis. ST evaluation with high level cognitive deficits affecting STM as well as solving/reasoning especially in math area. Blood pressure remains labile requiring Cardene drip, neck/back pain as well as issues with lethargy. Patient with resultant left sided weakness impacting mobility as well as safety. Therapy ongoing and CIR recommended by rehab team and MD.    Review of Systems  HENT: Negative for hearing loss.  Eyes: Negative for blurred vision.  Respiratory: Negative for cough and shortness of breath.  Cardiovascular: Negative for chest pain and palpitations.  Gastrointestinal: Negative for heartburn and nausea.  Musculoskeletal: Positive for myalgias, joint pain (left ankle pain) and neck pain.  Neurological: Positive for dizziness, sensory change (pins and neddles LLE), focal weakness, weakness and headaches.  Psychiatric/Behavioral: The patient is nervous/anxious. The patient does not have insomnia.     Past Medical History  Diagnosis Date  . Hypertension   . Lupus   . CKD (chronic kidney disease)     due to lupus/Dr. [name]  . Diabetes mellitus   . Stenosis of cervical spine region     with HNP at C5/6, C6/7  .  Metatarsal bone fracture right    4th    Past Surgical History  Procedure Laterality Date  . Breast biopsy        Family History  Problem Relation Age of Onset  . Hypertension    . Lupus    . Rheum arthritis    . Hypertension Mother   . Diabetes Mother   . Hypertension Sister   . Diabetes Father   . Hypertension Maternal Grandmother     Social History:  reports that she has been smoking Cigarettes. She has a 15 pack-year smoking history. She does not have any smokeless tobacco history on file. She reports that she does not drink alcohol or use illicit drugs.    Allergies  Allergen Reactions  . Sulfa Antibiotics Itching   Medications Prior to Admission  Medication Sig Dispense Refill  . amLODipine (NORVASC) 10 MG tablet take 1 tablet by mouth once daily 30 tablet 0  . aspirin 81 MG tablet Take 81 mg by mouth daily.    . Cholecalciferol (VITAMIN D3) 50000 UNITS CAPS Take 1 capsule by mouth 3 (three) times a week. 30 capsule 3  . ferrous sulfate 325 (65 FE) MG tablet Take 325 mg by mouth daily with breakfast.    . furosemide (LASIX) 20 MG tablet Take 20 mg by mouth 2 (two) times daily.    Marland Kitchen labetalol (NORMODYNE) 200 MG tablet Take 200 mg by mouth 2 (two) times daily.     Marland Kitchen lisinopril (PRINIVIL,ZESTRIL) 20 MG tablet Take 20 mg by mouth daily.    . multivitamin (RENA-VIT) TABS tablet Take 1 tablet by mouth daily.    Marland Kitchen  mycophenolate (CELLCEPT) 250 MG capsule Take 1,000 mg by mouth 2 (two) times daily.    . traMADol (ULTRAM) 50 MG tablet Take 50 mg by mouth 2 (two) times daily as needed for moderate pain.     . nicotine (NICODERM CQ - DOSED IN MG/24 HOURS) 14 mg/24hr patch Place 1 patch onto the skin daily.    . reserpine 0.1 MG TABS Take 0.1 mg by mouth daily.    Marland Kitchen spironolactone (ALDACTONE) 25 MG tablet Take 1 tablet (25 mg total) by mouth daily.  (Patient not taking: Reported on 09/28/2014) 90 tablet 1    Home: [place] expects to be discharged to:: Private residence Living Arrangements: Children Available Help at Discharge: Family, Available 24 hours/day Type of Home: House Home Access: Stairs to enter Technical brewer of Steps: 4 Entrance Stairs-Rails: Right Home Layout: Two level, Able to live on main level with bedroom/bathroom Home Equipment: Walker - standard (belonged to her aunt)  Functional History: Prior Function Level of Independence: Independent 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[employer]:  Mobility: Bed Mobility Overal bed mobility: Needs Assistance Bed Mobility: Supine to Sit Supine to sit: Min assist General bed mobility comments: cues for sequence with min assist to elevate trunk Transfers Overall transfer level: Needs assistance Equipment used: Rolling walker (2 wheeled) Transfers: Sit to/from Stand, [employer] Transfers Sit to Stand: Min assist Stand pivot transfers: Min assist General transfer comment: cues for hand placement and sequence. Increased cueing and assist with pivot left due to decreased control of LLE with knee extension and increased hip flexion Ambulation/Gait Ambulation/Gait assistance: Min assist Ambulation Distance (Feet): 60 Feet Assistive device: Rolling walker (2 wheeled) Gait Pattern/deviations: Step-to pattern, Decreased stance time - left, Narrow base of support Gait velocity interpretation: Below normal speed for age/gender General Gait Details: Pt with narrow BOS without scissoring and maintaining left foot flat today with decreased knee flexion and short stride length with cues for sequence and RW use    ADL:    Cognition: Cognition Overall Cognitive Status: Impaired/Different from baseline Arousal/Alertness: Awake/alert Orientation Level: Oriented X4 Attention: Selective Selective Attention: Appears intact Memory:  Impaired Memory Impairment: Retrieval deficit, Decreased short term memory Decreased Short Term Memory: Verbal basic Awareness: Appears intact Problem Solving: Impaired Problem Solving Impairment: Verbal complex Executive Function: 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, [employer]) Reasoning: Impaired Reasoning Impairment: Verbal complex Safety/Judgment: Appears intact Cognition Arousal/Alertness: Awake/alert Behavior During Therapy: Flat affect Overall Cognitive Status: Impaired/Different from baseline Area of Impairment: Orientation, Safety/judgement Orientation Level: Time Safety/Judgement: Decreased awareness of deficits  Blood pressure 163/66, pulse 96, temperature 98.2 F (36.8 C), temperature source Oral, resp. rate 18, height 5' 5.5" (1.664 m), weight 64.6 kg (142 lb 6.7 oz), last menstrual period 07/18/2012, SpO2 95 %. Physical Exam  Nursing note and vitals reviewed. Constitutional: She is oriented to person, place, and time. She appears well-developed and well-nourished. She appears lethargic. She is easily aroused. Nasal cannula in place.  HENT:  Head: Normocephalic and atraumatic.  Eyes: Conjunctivae are normal. Pupils are equal, round, and reactive to light.  Neck: Normal range of motion. Neck supple.  Cardiovascular: Regular rhythm. Tachycardia present.  Respiratory: Effort normal. She has wheezes.  GI: Soft. Bowel sounds are normal. She exhibits no distension. There is no tenderness.  Musculoskeletal:  2+ edema bilateral hands  Neurological: She is oriented to person, place, and time and easily aroused. She appears lethargic.  Anxious appearing. Needs cues to keep eyes open. Left inattention. Able to state day, month as  well as medical questions. Able to follow basic commands without difficulty. Subjective RUE weakness --poor effort. LLE weakness with dysesthesias. Sensation 1/2 LUE and LLE with PP and LT. Left PD.  Skin: Skin is warm and dry.  Psychiatric:  Affect flat, follows  basic commands.     Lab Results Last 24 Hours    Results for orders placed or performed during the hospital encounter of 09/28/14 (from the past 24 hour(s))  Basic metabolic panel Status: Abnormal   Collection Time: 10/02/14 4:54 AM  Result Value Ref Range   Sodium 138 137 - 147 mEq/L   Potassium 4.9 3.7 - 5.3 mEq/L   Chloride 111 96 - 112 mEq/L   CO2 11 (L) 19 - 32 mEq/L   Glucose, Bld 95 70 - 99 mg/dL   BUN 33 (H) 6 - 23 mg/dL   Creatinine, Ser 2.80 (H) 0.50 - 1.10 mg/dL   Calcium 8.3 (L) 8.4 - 10.5 mg/dL   GFR calc non Af Amer 18 (L) >90 mL/min   GFR calc Af Amer 21 (L) >90 mL/min   Anion gap 16 (H) 5 - 15  CBC Status: Abnormal   Collection Time: 10/02/14 4:54 AM  Result Value Ref Range   WBC 12.5 (H) 4.0 - 10.5 K/uL   RBC 3.21 (L) 3.87 - 5.11 MIL/uL   Hemoglobin 8.4 (L) 12.0 - 15.0 g/dL   HCT 26.6 (L) 36.0 - 46.0 %   MCV 82.9 78.0 - 100.0 fL   MCH 26.2 26.0 - 34.0 pg   MCHC 31.6 30.0 - 36.0 g/dL   RDW 14.6 11.5 - 15.5 %   Platelets 163 150 - 400 K/uL      Imaging Results (Last 48 hours)    No results found.    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 Does the need for close, 24 hr/day medical supervision in concert with the patient's rehab needs make it unreasonable for this patient to be served in a less intensive setting? Yes 2. Co-Morbidities requiring supervision/potential complications: htn, dm, ra 3. Due to bladder management, bowel management, safety, skin/wound care, disease management, medication administration, pain management and patient education, does the patient require 24 hr/day rehab nursing? Yes 4. Does the patient require coordinated care of a physician, rehab nurse, PT (1-2 hrs/day, 5 days/week), OT (1-2 hrs/day, 5 days/week) and SLP (1-2 hrs/day, 5 days/week)  to address physical and functional deficits in the context of the above medical diagnosis(es)? Yes Addressing deficits in the following areas: balance, endurance, locomotion, strength, transferring, bowel/bladder control, bathing, dressing, feeding, grooming, toileting, cognition, speech and psychosocial support 5. Can the patient actively participate in an intensive therapy program of at least 3 hrs of therapy per day at least 5 days per week? Yes 6. The potential for patient to make measurable gains while on inpatient rehab is excellent 7. Anticipated functional outcomes upon discharge from inpatient rehab are modified independent with PT, modified independent with OT, modified independent with SLP. 8. Estimated rehab length of stay to reach the above functional goals is: 7-10 days 9. Does the patient have adequate social supports and living environment to accommodate these discharge functional goals? Yes 10. Anticipated D/C setting: Home 11. Anticipated post D/C treatments: [facility] therapy 12. Overall Rehab/Functional Prognosis: excellent  RECOMMENDATIONS: This patient's condition is appropriate for continued rehabilitative care in the following setting: CIR Patient has agreed to participate in recommended program. Yes Note that insurance prior authorization may be required  for reimbursement for recommended care.  Comment: Rehab Admissions Coordinator to follow up.  Thanks,  Meredith Staggers, MD, [facility]     10/02/2014       Revision History     Date/Time User Provider Type Action   10/02/2014 9:32 AM Meredith Staggers, MD Physician Sign   10/02/2014 9:27 AM Meredith Staggers, MD Physician Share   10/02/2014 8:51 AM Bary Leriche, PA-C Physician Assistant Share   View Details Report       Routing History

## 2014-10-17 NOTE — PMR Pre-admission (Signed)
PMR Admission Coordinator Pre-Admission Assessment  Patient: [name] [name] is an 53 y.o., female MRN: [id number] DOB: July 15, 1961 Height: 5' 5.5" (166.4 cm) Weight: 71.169 kg (156 lb 14.4 oz)              Insurance Information HMO:     PPO:       PCP:       IPA:       80/20:       OTHER:  Group # [id number] PRIMARY: [employer] Managed      Policy#: [id number]      Subscriber: Tresa Res CM Name: Rayetta Humphrey      Phone#:  [phone]     Fax#: [phone] Pre-Cert#: [id number]      Employer: Working FT Benefits:  Phone #:  [phone]     Name: Automated Eff. Date: 12/04/12     Deduct: $1500 (met all)      Out of Pocket Max: $3500 (met $ 2064.31      Life Max: unlimited CIR: 80%      SNF: 80% with 100 days max Outpatient: 80%     Co-Pay: 20% Home Health: 80%      Co-Pay: 20% DME: 80%     Co-Pay: 20% Providers: in network   Emergency Contact Information Contact Information    Name Relation Home Work Mobile   Andrews Son [phone] [place] other [phone]  [phone]   Madill,Brittany Daughter   [phone]   Krisy, Dix   [phone]     Current Medical History  Patient Admitting Diagnosis:  R pontine hemorrhage  History of Present Illness:  A 53 y.o. female with history of HTN, hyperlipidemia, lupus with mild LLE weakness as well as compliance issues who was admitted on 09/28/14 with acute onset left sided weakness and numbness as well as gaze to the right side. Patient reported being out of BP meds for 3 weeks and BP 238/88 at admission. UDS negative. 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 Carotid dopplers significant for R-40-59% ICA stenosis. She was treated with IV diuresis for SOB due to fluid overload. Lisinopril was dicontinued due to worsening of renal status and she was treated  with IV bicarb for acidosis. Repeat cardiac echo showed EF 60-70% with AV sclerosis without stenosis. FEES done yesterday showing mild dysphagia with pentration due to large boluses but no aspiration therefore she was started on regular textures with thin liquids. She continued to have complaints of neck, back and ankle pain as well as issues with fluctuating bouts of lethargy. Follow up MRI/MRA brain done on 12/03 revealing changes c/w acute/subacute hemorrhage in right parmedian pons and no underlying mass evident. Minimal diffusion abnormality in high frontal lobes bilaterally likely due to calcification and probably meningioma.  Patient with resultant left sided weakness impacting mobility as well as safety.  CIR was recommended by rehab team and patient was admitted for inpatient rehab program on 10/05/14. Heat alternating with ice was added to help with left neck and shoulder pain. Oxycodone was used additionally for ongoing issues with diffuse arthralgias. Blood pressures were monitored on TID basis and were resonably controlled. H/H was monitored with serial checks due to downward trend. Stool guaiac X 1 was negative but recheck labs on 12/8 showed further drop in H/H to 6.1 with elevated WBC 21.9. Patient returned to acute hospital on 10/10/14  with sepsis.  She has continued to receive and participate with PT and OT and is now stable.  Will re admit to acute inpatient rehab today to complete rehab program.  Past Medical History  Past Medical History  Diagnosis Date  . Hypertension   . Lupus   . CKD (chronic kidney disease)     due to lupus/Dr. [name]  . Diabetes mellitus   . Stenosis of cervical spine region     with HNP at C5/6, C6/7  . Metatarsal bone fracture right    4th  . 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 Arthritis     RA  . Anemia   . Stroke     Family History  family history includes Diabetes in her father and mother;  Hypertension in her maternal grandmother, mother, sister, and another family member; Lupus in an other family member; Rheum arthritis in an other family member.  Prior Rehab/Hospitalizations:  Was on inpatient rehab from 10/05/14 to 10/10/14 when she returned to acute hospital with sepsis.   Current Medications  Current facility-administered medications: acetaminophen (TYLENOL) tablet 650 mg, 650 mg, Oral, Q6H PRN, 650 mg at 10/13/14 0316 **OR** acetaminophen (TYLENOL) suppository 650 mg, 650 mg, Rectal, Q6H PRN, Nishant Dhungel, MD;  amLODipine (NORVASC) tablet 10 mg, 10 mg, Oral, Daily, Nishant Dhungel, MD, 10 mg at 10/17/14 1004 antiseptic oral rinse (CPC / CETYLPYRIDINIUM CHLORIDE 0.05%) solution 7 mL, 7 mL, Mouth Rinse, BID, Nishant Dhungel, MD, 7 mL at 10/17/14 1000;  camphor-menthol (SARNA) lotion, , Topical, PRN, Jeryl Columbia, NP;  cloNIDine (CATAPRES) tablet 0.1 mg, 0.1 mg, Oral, TID, Cherene Altes, MD, 0.1 mg at 10/17/14 1308;  cyclobenzaprine (FLEXERIL) tablet 5 mg, 5 mg, Oral, TID PRN, Cherene Altes, MD ferrous sulfate tablet 325 mg, 325 mg, Oral, Q breakfast, Cherene Altes, MD, 325 mg at 10/17/14 0825;  furosemide (LASIX) tablet 80 mg, 80 mg, Oral, BID, Verlee Monte, MD, 80 mg at 10/17/14 0825;  hydroxychloroquine (PLAQUENIL) tablet 400 mg, 400 mg, Oral, Daily, Allie Bossier, MD, 400 mg at 10/17/14 1004;  insulin aspart (novoLOG) injection 0-15 Units, 0-15 Units, Subcutaneous, TID WC, Dianne Dun, NP, 5 Units at 10/17/14 1148 insulin aspart (novoLOG) injection 0-5 Units, 0-5 Units, Subcutaneous, QHS, Dianne Dun, NP, 3 Units at 10/16/14 2147;  isosorbide mononitrate (IMDUR) 24 hr tablet 60 mg, 60 mg, Oral, Daily, Verlee Monte, MD, 60 mg at 10/17/14 1004;  labetalol (NORMODYNE) tablet 200 mg, 200 mg, Oral, BID, Verlee Monte, MD, 200 mg at 10/17/14 1004 multivitamin (RENA-VIT) tablet 1 tablet, 1 tablet, Oral, Daily, Nishant Dhungel, MD, 1 tablet at  10/17/14 1004;  nicotine (NICODERM CQ - dosed in mg/24 hours) patch 14 mg, 14 mg, Transdermal, Q24H, Nishant Dhungel, MD, 14 mg at 10/17/14 1308;  ondansetron (ZOFRAN) tablet 4 mg, 4 mg, Oral, Q6H PRN **OR** ondansetron (ZOFRAN) injection 4 mg, 4 mg, Intravenous, Q6H PRN, Nishant Dhungel, MD pantoprazole (PROTONIX) EC tablet 40 mg, 40 mg, Oral, BID, Cherene Altes, MD, 40 mg at 10/17/14 1147;  predniSONE (DELTASONE) tablet 40 mg, 40 mg, Oral, Q breakfast, Verlee Monte, MD, 40 mg at 10/17/14 0825;  sodium bicarbonate tablet 1,300 mg, 1,300 mg, Oral, BID, Cherene Altes, MD, 1,300 mg at 10/17/14 1004;  sodium chloride 0.9 % injection 3 mL, 3 mL, Intravenous, Q12H, Nishant Dhungel, MD, 3 mL at 10/15/14 1043 Vitamin D (Ergocalciferol) (DRISDOL)  capsule 50,000 Units, 50,000 Units, Oral, Q Wed, [name], MD, 50,000 Units at 10/11/14 1056  Patients Current Diet: Diet Carb Modified Diet - low sodium heart healthy  Precautions / Restrictions Precautions Precautions: Fall Precaution Comments: swollen joints, increased pain Restrictions Weight Bearing Restrictions: No   Prior Activity Level Went out daily.  Worked FT in [employer].  Was driving.  Home Assistive Devices / Equipment Home Assistive Devices/Equipment: Cane (specify quad or straight) Home Equipment: Walker - standard  Prior Functional Level Prior Function Level of Independence: Independent 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 Hobbies include spending time with family and going out to eat, play candy crush  Current Functional Level Cognition  Overall Cognitive Status: Within Functional Limits for tasks assessed Current Attention Level: Selective Orientation Level: Oriented X4 Safety/Judgement: Decreased awareness of safety, Decreased awareness of deficits    Extremity Assessment (includes Sensation/Coordination)  Upper Extremity Assessment: Defer to OT evaluation Lower Extremity Assessment: RLE deficits/detail;LLE  deficits/detail (limited ROM secondary to pain and swelling of joints)    ADLs  Overall ADL's : Needs assistance/impaired Eating/Feeding: Set up, Bed level Grooming: Wash/dry hands, Wash/dry face, Oral care, Minimal assistance, Standing Upper Body Bathing: Minimal assitance, Sitting Lower Body Bathing: Moderate assistance, Sit to/from stand Upper Body Dressing : Min guard, Sitting, Standing Upper Body Dressing Details (indicate cue type and reason): Pt donned and doffed robe sit to stand. Lower Body Dressing: Moderate assistance, Sit to/from stand Lower Body Dressing Details (indicate cue type and reason): Pt able to don/doff socks.  Dependent upon UE support for standing  Toilet Transfer: Minimal assistance, Ambulation, BSC Toilet Transfer Details (indicate cue type and reason): Simulated transfer as pt did not need to toilet. Toileting- Clothing Manipulation and Hygiene: Moderate assistance, Sitting/lateral lean Functional mobility during ADLs: Minimal assistance General ADL Comments: Pt currently needs min assist for mobility but demonstrates short step length on the left with increased ataxia when advancing the LLE and decreased stance phase when attempting to advance the RLE.  Pt educated on FM coordination tasks of manipulating her tops on the toothpaste and deodorant with the LUE.  Also discussed getting a deck of cards to use for sorting/shuffling when she is not in therapy.  Provided taped up papertowel to simulate ball to work on manipulating and tossing with her son Barbaraann Rondo.       Mobility  Overal bed mobility: Needs Assistance Bed Mobility: Supine to Sit Supine to sit: Supervision, HOB elevated Sit to supine: Min guard General bed mobility comments: Pt with slight use of the bed rail on the right side to transition to sitting.    Transfers  Overall transfer level: Needs assistance Equipment used: None Transfers: Sit to/from Stand Sit to Stand: Min assist Stand pivot  transfers: Min assist General transfer comment: Pt with increased weightshift to the right in standing and with transfer.  Decreased ability to feel on her left side making it apprehensive to support her weight at times.     Ambulation / Gait / Stairs / Wheelchair Mobility  Ambulation/Gait Ambulation/Gait assistance: Min assist, +2 physical assistance Ambulation Distance (Feet): 60 Feet Assistive device: Rolling walker (2 wheeled) Gait Pattern/deviations: Ataxic, Step-through pattern, Decreased stride length, Drifts right/left, Narrow base of support Gait velocity: decreased Gait velocity interpretation: Below normal speed for age/gender General Gait Details: Gait retraining with tactile facilitation of LE position from heel strike through mid stance with attention to posture and postioning to address hyperextension LLE.  +2 faciliation for hip and trunk control  Posture / Balance Dynamic Sitting Balance Sitting balance - Comments: improved midline position today    Special needs/care consideration BiPAP/CPAP No CPM No Continuous Drip IV No Dialysis No         Life Vest No Oxygen No Special Bed No Trach Size No Wound Vac (area) No     Skin No                              Bowel mgmt: Last BM 10/16/14 Bladder mgmt: Voiding WDL Diabetic mgmt Yes    Previous Home Environment Living Arrangements: Children  Lives With: Son, Daughter Available Help at Discharge: Family, Available 24 hours/day Type of Home: House Home Layout: Two level, Able to live on main level with bedroom/bathroom Home Access: Stairs to enter Entrance Stairs-Rails: Right Entrance Stairs-Number of Steps: 4 Bathroom Shower/Tub: Gaffer, Door [employer] Toilet: Handicapped height Bathroom Accessibility: Yes How Accessible: Accessible via walker [place]: No  Discharge Living Setting Plans for Discharge Living Setting: Lives with (comment), Apartment (Lives with 2 sons and 1 daughter.  Children  all work.) Type of Home at Discharge: Apartment Discharge Home Layout: Two level, Able to live on main level with bedroom/bathroom Alternate Level Stairs-Number of Steps: Flight Discharge Home Access: Stairs to enter Entrance Stairs-Number of Steps: 4-5 steps Does the patient have any problems obtaining your medications?: No  Social/Family/Support Systems Patient Roles: Parent, Other (Comment) (Has 2 sons, 1 dtr and son's father.) Contact Information: Rashonda Warrior - son [phone] Anticipated Caregiver: Children and son's father Anticipated Caregiver's Contact Information: Delfino Lovett - son's father [phone] Ability/Limitations of Caregiver: Between sons, dtr and son's father, only short periods of the day may be alone, only 30 minute or so gaps in coverage Caregiver Availability: Other (Comment) (Has close to 24/7 coverage.) Discharge Plan Discussed with Primary Caregiver: Yes Is Caregiver In Agreement with Plan?: Yes Does Caregiver/Family have Issues with Lodging/Transportation while Pt is in Rehab?: No  Goals/Additional Needs Patient/Family Goal for Rehab: PT/OT/ST mod I to Supervision goals Expected length of stay: 7-10 days Cultural Considerations: None Dietary Needs: Carb mod med cal, thin liquids Equipment Needs: TBD Pt/Family Agrees to Admission and willing to participate: Yes Program Orientation Provided & Reviewed with Pt/Caregiver Including Roles  & Responsibilities: Yes  Decrease burden of Care through IP rehab admission: N/A  Possible need for SNF placement upon discharge: Not planned  Patient Condition: This patient's medical and functional status has changed since the consult dated: 10/02/14 in which the Rehabilitation Physician determined and documented that the patient's condition is appropriate for intensive rehabilitative care in an inpatient rehabilitation facility. See "History of Present Illness" (above) for medical update. Functional changes are: Currently  requiring min assist for transfers and min assist to amb 60 ft RW. Patient's medical and functional status update has been discussed with the Rehabilitation physician and patient remains appropriate for inpatient rehabilitation. Will admit to inpatient rehab today.  Preadmission Screen Completed By:  Retta Diones, 10/17/2014 2:16 PM ______________________________________________________________________   Discussed status with Dr. Naaman Plummer on 10/17/14 at 1415 and received telephone approval for admission today.  Admission Coordinator:  Retta Diones, time1415/Date12/15/15

## 2014-10-17 NOTE — Progress Notes (Signed)
Items gathered and pt moved to 4w04

## 2014-10-17 NOTE — Progress Notes (Signed)
Occupational Therapy Treatment Patient Details Name: [name] [name] MRN: [id number] DOB: 20-Feb-1961 Today's Date: 10/17/2014    History of present illness 53 y/o black female with complicated history with concern for sepsis and polyarthralgias/myalgias.  Recently was at [facility] following admission with acute pontine hemorrhagic infarct. PMH positive for hyperlipidemia, HTN and compliance issues.   OT comments  Pt making good progress.  Min assist hand held for transfers and short distance mobility.  Still with decreased timing and sequencing in the LLE resulting in short step length and the need for hand held support.  She has been educated on FM coordination tasks for the LUE to be performed when she is in her room.  Will benefit from continued more intensive rehab at CIR level.   Follow Up Recommendations  CIR    Equipment Recommendations  3 in 1 bedside comode    Recommendations for Other Services Rehab consult    Precautions / Restrictions Precautions Precautions: Fall Restrictions Weight Bearing Restrictions: No       Mobility Bed Mobility Overal bed mobility: Needs Assistance Bed Mobility: Supine to Sit     Supine to sit: Supervision;HOB elevated     General bed mobility comments: Pt with slight use of the bed rail on the right side to transition to sitting.  Transfers Overall transfer level: Needs assistance Equipment used: None   Sit to Stand: Min assist Stand pivot transfers: Min assist       General transfer comment: Pt with increased weightshift to the right in standing and with transfer.  Decreased ability to feel on her left side making it apprehensive to support her weight at times.     Balance Overall balance assessment: Needs assistance   Sitting balance-Leahy Scale: Good     Standing balance support: During functional activity Standing balance-Leahy Scale: Fair                     ADL Overall ADL's : Needs assistance/impaired                  Upper Body Dressing : Min guard;Sitting;Standing Upper Body Dressing Details (indicate cue type and reason): Pt donned and doffed robe sit to stand.     Toilet Transfer: Minimal assistance;Ambulation;BSC Toilet Transfer Details (indicate cue type and reason): Simulated transfer as pt did not need to toilet.         Functional mobility during ADLs: Minimal assistance General ADL Comments: Pt currently needs min assist for mobility but demonstrates short step length on the left with increased ataxia when advancing the LLE and decreased stance phase when attempting to advance the RLE.  Pt educated on FM coordination tasks of manipulating her tops on the toothpaste and deodorant with the LUE.  Also discussed getting a deck of cards to use for sorting/shuffling when she is not in therapy.  Provided taped up papertowel to simulate ball to work on manipulating and tossing with her son Thereasa Distance.                   Cognition   Behavior During Therapy: WFL for tasks assessed/performed Overall Cognitive Status: Within Functional Limits for tasks assessed     Current Attention Level: Selective                              Pertinent Vitals/ Pain       Pain Assessment: No/denies pain  Frequency Min 3X/week     Progress Toward Goals  OT Goals(current goals can now be found in the care plan section)  Progress towards OT goals: Progressing toward goals  Acute Rehab OT Goals Potential to Achieve Goals: Good  Plan Discharge plan remains appropriate       End of Session Equipment Utilized During Treatment: Gait belt   Activity Tolerance Patient tolerated treatment well   Patient Left in bed;with call bell/phone within reach;with family/visitor present   Nurse Communication Mobility status        Time: 1610-9604 OT Time Calculation (min): 30 min  Charges: OT General Charges $OT Visit: 1 Procedure OT Treatments $Neuromuscular Re-education: 23-37  mins  Ariq Khamis OTR/L 10/17/2014, 2:05 PM

## 2014-10-17 NOTE — Progress Notes (Signed)
Report called and given to [place] on [place].

## 2014-10-17 NOTE — Progress Notes (Signed)
PMR Admission Coordinator Pre-Admission Assessment  Patient: [name] [name] is an 53 y.o., female MRN: [id number] DOB: 1961/03/04 Height: 5' 5.5" (166.4 cm) Weight: 71.169 kg (156 lb 14.4 oz)  Insurance Information HMO: PPO: PCP: IPA: 80/20: OTHER: Group # [id number] PRIMARY: [employer] Managed Policy#: [id number] Subscriber: Tresa Res CM Name: Rayetta Humphrey Phone#: [phone] Fax#: [phone] Pre-Cert#: [id number] Employer: Working FT Benefits: Phone #: [phone] Name: Automated Eff. Date: 12/04/12 Deduct: $1500 (met all) Out of Pocket Max: $3500 (met $ 2064.31 Life Max: unlimited CIR: 80% SNF: 80% with 100 days max Outpatient: 80% Co-Pay: 20% Home Health: 80% Co-Pay: 20% DME: 80% Co-Pay: 20% Providers: in network   Emergency Contact Information Contact Information    Name Relation Home Work Mobile   [place] Son [phone] [place] other [phone]  [phone]   Capito,Brittany Daughter   [phone]   Melane, [place]   [phone]     Current Medical History  Patient Admitting Diagnosis: R pontine hemorrhage  History of Present Illness: A 53 y.o. female with history of HTN, hyperlipidemia, lupus with mild LLE weakness as well as compliance issues who was admitted on 09/28/14 with acute onset left sided weakness and numbness as well as gaze to the right side. Patient reported being out of BP meds for 3 weeks and BP 238/88 at admission. UDS negative. 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 Carotid dopplers significant for  R-40-59% ICA stenosis. She was treated with IV diuresis for SOB due to fluid overload. Lisinopril was dicontinued due to worsening of renal status and she was treated with IV bicarb for acidosis. Repeat cardiac echo showed EF 60-70% with AV sclerosis without stenosis. FEES done yesterday showing mild dysphagia with pentration due to large boluses but no aspiration therefore she was started on regular textures with thin liquids. She continued to have complaints of neck, back and ankle pain as well as issues with fluctuating bouts of lethargy. Follow up MRI/MRA brain done on 12/03 revealing changes c/w acute/subacute hemorrhage in right parmedian pons and no underlying mass evident. Minimal diffusion abnormality in high frontal lobes bilaterally likely due to calcification and probably meningioma.  Patient with resultant left sided weakness impacting mobility as well as safety.  CIR was recommended by rehab team and patient was admitted for inpatient rehab program on 10/05/14. Heat alternating with ice was added to help with left neck and shoulder pain. Oxycodone was used additionally for ongoing issues with diffuse arthralgias. Blood pressures were monitored on TID basis and were resonably controlled. H/H was monitored with serial checks due to downward trend. Stool guaiac X 1 was negative but recheck labs on 12/8 showed further drop in H/H to 6.1 with elevated WBC 21.9. Patient returned to acute hospital on 10/10/14 with sepsis. She has continued to receive and participate with PT and OT and is now stable. Will re admit to acute inpatient rehab today to complete rehab program.  Past Medical History  Past Medical History  Diagnosis Date  . Hypertension   . Lupus   . CKD (chronic kidney disease)     due to lupus/Dr. [name]  . Diabetes mellitus   . Stenosis of cervical spine region     with HNP at C5/6, C6/7  . Metatarsal bone fracture right    4th  . Chronic ankle  pain     due to RA?  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 Arthritis     RA  . Anemia   . Stroke     Family History  family history includes Diabetes in her father and mother; Hypertension in her maternal grandmother, mother, sister, and another family member; Lupus in an other family member; Rheum arthritis in an other family member.  Prior Rehab/Hospitalizations: Was on inpatient rehab from 10/05/14 to 10/10/14 when she returned to acute hospital with sepsis.  Current Medications  Current facility-administered medications: acetaminophen (TYLENOL) tablet 650 mg, 650 mg, Oral, Q6H PRN, 650 mg at 10/13/14 0316 **OR** acetaminophen (TYLENOL) suppository 650 mg, 650 mg, Rectal, Q6H PRN, Nishant Dhungel, MD; amLODipine (NORVASC) tablet 10 mg, 10 mg, Oral, Daily, Nishant Dhungel, MD, 10 mg at 10/17/14 1004 antiseptic oral rinse (CPC / CETYLPYRIDINIUM CHLORIDE 0.05%) solution 7 mL, 7 mL, Mouth Rinse, BID, Nishant Dhungel, MD, 7 mL at 10/17/14 1000; camphor-menthol (SARNA) lotion, , Topical, PRN, Jeryl Columbia, NP; cloNIDine (CATAPRES) tablet 0.1 mg, 0.1 mg, Oral, TID, Cherene Altes, MD, 0.1 mg at 10/17/14 1308; cyclobenzaprine (FLEXERIL) tablet 5 mg, 5 mg, Oral, TID PRN, Cherene Altes, MD ferrous sulfate tablet 325 mg, 325 mg, Oral, Q breakfast, Cherene Altes, MD, 325 mg at 10/17/14 0825; furosemide (LASIX) tablet 80 mg, 80 mg, Oral, BID, Verlee Monte, MD, 80 mg at 10/17/14 0825; hydroxychloroquine (PLAQUENIL) tablet 400 mg, 400 mg, Oral, Daily, Allie Bossier, MD, 400 mg at 10/17/14 1004; insulin aspart (novoLOG) injection 0-15 Units, 0-15 Units, Subcutaneous, TID WC, Dianne Dun, NP, 5 Units at 10/17/14 1148 insulin aspart (novoLOG) injection 0-5 Units, 0-5 Units, Subcutaneous, QHS, Dianne Dun, NP, 3 Units at 10/16/14 2147; isosorbide mononitrate (IMDUR) 24 hr tablet 60 mg, 60 mg, Oral, Daily, Verlee Monte, MD,  60 mg at 10/17/14 1004; labetalol (NORMODYNE) tablet 200 mg, 200 mg, Oral, BID, Verlee Monte, MD, 200 mg at 10/17/14 1004 multivitamin (RENA-VIT) tablet 1 tablet, 1 tablet, Oral, Daily, Nishant Dhungel, MD, 1 tablet at 10/17/14 1004; nicotine (NICODERM CQ - dosed in mg/24 hours) patch 14 mg, 14 mg, Transdermal, Q24H, Nishant Dhungel, MD, 14 mg at 10/17/14 1308; ondansetron (ZOFRAN) tablet 4 mg, 4 mg, Oral, Q6H PRN **OR** ondansetron (ZOFRAN) injection 4 mg, 4 mg, Intravenous, Q6H PRN, Nishant Dhungel, MD pantoprazole (PROTONIX) EC tablet 40 mg, 40 mg, Oral, BID, Cherene Altes, MD, 40 mg at 10/17/14 1147; predniSONE (DELTASONE) tablet 40 mg, 40 mg, Oral, Q breakfast, Verlee Monte, MD, 40 mg at 10/17/14 0825; sodium bicarbonate tablet 1,300 mg, 1,300 mg, Oral, BID, Cherene Altes, MD, 1,300 mg at 10/17/14 1004; sodium chloride 0.9 % injection 3 mL, 3 mL, Intravenous, Q12H, Nishant Dhungel, MD, 3 mL at 10/15/14 1043 Vitamin D (Ergocalciferol) (DRISDOL) capsule 50,000 Units, 50,000 Units, Oral, Q Wed, [name], MD, 50,000 Units at 10/11/14 1056  Patients Current Diet: Diet Carb Modified Diet - low sodium heart healthy  Precautions / Restrictions Precautions Precautions: Fall Precaution Comments: swollen joints, increased pain Restrictions Weight Bearing Restrictions: No   Prior Activity Level Went out daily. 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  Home Assistive Devices / Equipment Home Assistive Devices/Equipment: Cane (specify quad or straight) Home Equipment: Walker - standard  Prior Functional Level Prior Function Level of Independence: Independent 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 Hobbies include spending time with family and going out to eat, play candy crush  Current Functional Level Cognition  Overall Cognitive Status: Within Functional Limits for tasks assessed  Current Attention Level: Selective Orientation Level: Oriented X4 Safety/Judgement: Decreased  awareness of safety, Decreased awareness of deficits   Extremity Assessment (includes Sensation/Coordination)  Upper Extremity Assessment: Defer to OT evaluation Lower Extremity Assessment: RLE deficits/detail;LLE deficits/detail (limited ROM secondary to pain and swelling of joints)   ADLs  Overall ADL's : Needs assistance/impaired Eating/Feeding: Set up, Bed level Grooming: Wash/dry hands, Wash/dry face, Oral care, Minimal assistance, Standing Upper Body Bathing: Minimal assitance, Sitting Lower Body Bathing: Moderate assistance, Sit to/from stand Upper Body Dressing : Min guard, Sitting, Standing Upper Body Dressing Details (indicate cue type and reason): Pt donned and doffed robe sit to stand. Lower Body Dressing: Moderate assistance, Sit to/from stand Lower Body Dressing Details (indicate cue type and reason): Pt able to don/doff socks. Dependent upon UE support for standing  Toilet Transfer: Minimal assistance, Ambulation, BSC Toilet Transfer Details (indicate cue type and reason): Simulated transfer as pt did not need to toilet. Toileting- Clothing Manipulation and Hygiene: Moderate assistance, Sitting/lateral lean Functional mobility during ADLs: Minimal assistance General ADL Comments: Pt currently needs min assist for mobility but demonstrates short step length on the left with increased ataxia when advancing the LLE and decreased stance phase when attempting to advance the RLE. Pt educated on FM coordination tasks of manipulating her tops on the toothpaste and deodorant with the LUE. Also discussed getting a deck of cards to use for sorting/shuffling when she is not in therapy. Provided taped up papertowel to simulate ball to work on manipulating and tossing with her son [name] [name].     Mobility  Overal bed mobility: Needs Assistance Bed Mobility: Supine to Sit Supine to sit: Supervision, HOB elevated Sit to supine: Min guard General bed mobility comments: Pt with  slight use of the bed rail on the right side to transition to sitting.    Transfers  Overall transfer level: Needs assistance Equipment used: None Transfers: Sit to/from Stand Sit to Stand: Min assist Stand pivot transfers: Min assist General transfer comment: Pt with increased weightshift to the right in standing and with transfer. Decreased ability to feel on her left side making it apprehensive to support her weight at times.     Ambulation / Gait / Stairs / Wheelchair Mobility  Ambulation/Gait Ambulation/Gait assistance: Min assist, +2 physical assistance Ambulation Distance (Feet): 60 Feet Assistive device: Rolling walker (2 wheeled) Gait Pattern/deviations: Ataxic, Step-through pattern, Decreased stride length, Drifts right/left, Narrow base of support Gait velocity: decreased Gait velocity interpretation: Below normal speed for age/gender General Gait Details: Gait retraining with tactile facilitation of LE position from heel strike through mid stance with attention to posture and postioning to address hyperextension LLE. +2 faciliation for hip and trunk control    Posture / Balance Dynamic Sitting Balance Sitting balance - Comments: improved midline position today    Special needs/care consideration BiPAP/CPAP No CPM No Continuous Drip IV No Dialysis No  Life Vest No Oxygen No Special Bed No Trach Size No Wound Vac (area) No  Skin No  Bowel mgmt: Last BM 10/16/14 Bladder mgmt: Voiding WDL Diabetic mgmt Yes    Previous Home Environment Living Arrangements: Children Lives With: Son, Daughter Available Help at Discharge: Family, Available 24 hours/day Type of Home: House Home Layout: Two level, Able to live on main level with bedroom/bathroom Home Access: Stairs to enter Entrance Stairs-Rails: Right Entrance Stairs-Number of Steps: 4 Bathroom Shower/Tub: Gaffer, Door [employer] Toilet: Handicapped  height Bathroom Accessibility: Yes How Accessible: Accessible via walker Home Care Services: No  Discharge Living Setting Plans for Discharge Living Setting: Lives with (comment), Apartment (Lives with 2 sons and 1 daughter. Children all work.) Type of Home at Discharge: Apartment Discharge Home Layout: Two level, Able to live on main level with bedroom/bathroom Alternate Level Stairs-Number of Steps: Flight Discharge Home Access: Stairs to enter Entrance Stairs-Number of Steps: 4-5 steps Does the patient have any problems obtaining your medications?: No  Social/Family/Support Systems Patient Roles: Parent, Other (Comment) (Has 2 sons, 1 dtr and son's father.) Contact Information: [name] [name] - son [phone] Anticipated Caregiver: Children and son's father Anticipated Caregiver's Contact Information: [name] [name] - son's father [phone] Ability/Limitations of Caregiver: Between sons, dtr and son's father, only short periods of the day may be alone, only 30 minute or so gaps in coverage Caregiver Availability: Other (Comment) (Has close to 24/7 coverage.) Discharge Plan Discussed with Primary Caregiver: Yes Is Caregiver In Agreement with Plan?: Yes Does Caregiver/Family have Issues with Lodging/Transportation while Pt is in Rehab?: No  Goals/Additional Needs Patient/Family Goal for Rehab: PT/OT/ST mod I to Supervision goals Expected length of stay: 7-10 days Cultural Considerations: None Dietary Needs: Carb mod med cal, thin liquids Equipment Needs: TBD Pt/Family Agrees to Admission and willing to participate: Yes Program Orientation Provided & Reviewed with Pt/Caregiver Including Roles & Responsibilities: Yes  Decrease burden of Care through IP rehab admission: N/A  Possible need for SNF placement upon discharge: Not planned  Patient Condition: This patient's medical and functional status has changed since the consult dated: 10/02/14 in which the Rehabilitation Physician  determined and documented that the patient's condition is appropriate for intensive rehabilitative care in an inpatient rehabilitation facility. See "History of Present Illness" (above) for medical update. Functional changes are: Currently requiring min assist for transfers and min assist to amb 60 ft RW. Patient's medical and functional status update has been discussed with the Rehabilitation physician and patient remains appropriate for inpatient rehabilitation. Will admit to inpatient rehab today.  Preadmission Screen Completed By: Retta Diones, 10/17/2014 2:16 PM ______________________________________________________________________  Discussed status with Dr. Naaman Plummer on 10/17/14 at 1415 and received telephone approval for admission today.  Admission Coordinator: Retta Diones, time1415/Date12/15/15          Cosigned by: Meredith Staggers, MD at 10/17/2014 2:36 PM  Revision History

## 2014-10-17 NOTE — Progress Notes (Signed)
Rehab admissions - I have approval for acute inpatient rehab admission for today.  Bed available and will admit to rehab today.  Call me for questions.  #[phone]

## 2014-10-17 NOTE — H&P (View-Only) (Signed)
Physical Medicine and Rehabilitation Admission H&P   CC: left sided weakness, left inattention, diffuse pain.  HPI: [name] [name] is a 53 y.o. female with history of HTN, hyperlipidemia, lupus with mild LLE weakness as well as compliance issues who was admitted on 09/28/14 with acute onset left sided weakness and numbness as well as gaze to the right side. Patient reported being out of BP meds for 3 weeks and BP 238/88 at admission. UDS negative. 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 Carotid dopplers significant for R-40-59% ICA stenosis. She was treated with IV diuresis for SOB due to fluid overload. Lisinopril was dicontinued due to worsening of renal status and she was treated with IV bicarb for acidosis. Repeat cardiac echo showed EF 60-70% with AV sclerosis without stenosis. FEES done yesterday showing mild dysphagia with pentration due to large boluses but no aspiration therefore she was started on regular textures with thin liquids. She continued to have complaints of neck, back and ankle pain as well as issues with fluctuating bouts of lethargy. Follow up MRI/MRA brain done on 12/03 revealing changes c/w acute/subacute hemorrhage in right parmedian pons and no underlying mass evident. Minimal diffusion abnormality in high frontal lobes bilaterally likely due to calcification and probably meningioma.  Patient with resultant left sided weakness impacting mobility as well as safety.  CIR was recommended by rehab team and patient was admitted for inpatient rehab program on 10/05/14. Heat alternating with ice was added to help with left neck and shoulder pain. Oxycodone was used additionally for ongoing issues with diffuse arthralgias. Blood pressures were monitored on TID basis and were resonably controlled. H/H was monitored with serial checks due to downward  trend. Stool guaiac X 1 was negative but recheck labs on 12/8 showed further drop in H/H to 6.1 with elevated WBC 21.9 as well as hyperkalemia with K- 5.5. She was febrile with T-max 101 was noted to be lethargic with complaints of diffuse pain affecting bilateral knees, feet, elbows as well as neck and feet. Dr. Clementeen Graham was consulted for input and recommended transfer to stepdown due to concerns of sepsis as well as GIB.  EEG was negative for seizure activity.   She was transfused with 2 units PRBC and started on IV antibiotics . Work up without evidence of infection and most consistent with connective disease flare. X rays of bilateral knees, ankles, hands and wrists negative for inflammatory/infectious changes. She was started on high dose steroids per input from ortho. Renal was consulted for input and recommended IV bicarb for metabolic acidosis and no indication to treat mild hyperkalemia. 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 Blood work done revealing ANA +, elevated RA factor, normal complement and elevated levels mycophenolic acid. Rheumatology consult recommended for work up.  Renal ultrasound showed evidence of medical renal disease and no obstructive uropathy. Dr. Amedeo Plenty was consulted for input and recommended conservative management as stools were heme negative and anemia likely due to autoimmune component. H/H has been stable and GI has signed off. Patient has has labile blood pressures as well as increase of > 4 kg in weight therefore lasix was resumed and titrated to bid yesterday per renal input. Dr. Jimmy Footman suspects Cr to rise with better control and can push labatelol or coreg. On plaquenil with recommendations for Opthal evaluation for baseline past discharge. Recommendations for  prednisone taper in a week? As well as follow up with Rheum past discharge.   Arthralgias have improved with resolution of lethargy. Patient has been afebrile with reactive  leucocytosis resolving.  Blood sugars trending high on current dose steriods. Baseline Cr 3.2-3.5. Therapies ongoing and working on pregait activity. She is showing improvement in tolerance of activity and was admitted back to complete CIR program.    Review of Systems  HENT: Negative for hearing loss.   Eyes: Negative for blurred vision and double vision.  Respiratory: Negative for cough, shortness of breath and wheezing.   Cardiovascular: Negative for chest pain and palpitations.  Gastrointestinal: Negative for heartburn, nausea, vomiting and abdominal pain.  Genitourinary: Negative for urgency and frequency.  Musculoskeletal: Positive for myalgias, joint pain (left foot and ankle) and neck pain.  Neurological: Negative for dizziness, tingling and headaches.      Past Medical History  Diagnosis Date  . Hypertension   . Lupus   . CKD (chronic kidney disease)     due to lupus/Dr. Hyman Hopes  . Diabetes mellitus   . Stenosis of cervical spine region     with HNP at C5/6, C6/7  . Metatarsal bone fracture right    4th  . 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 Arthritis     RA  . Anemia   . Stroke     Past Surgical History  Procedure Laterality Date  . Breast biopsy      Family History  Problem Relation Age of Onset  . Hypertension    . Lupus    . Rheum arthritis    . Hypertension Mother   . Diabetes Mother   . Hypertension Sister   . Diabetes Father   . Hypertension Maternal Grandmother     Social History:  Lives with son and daughter. Works as a Merchandiser, retail in [employer]. Has a boyfriend who can also assist past discharge. She reports that she has been smoking Cigarettes--about 1/2 PPD. She has a 15 pack-year smoking history. She does not have any smokeless tobacco history on file. She reports that she drink alcohol couple of times a year. She does not use illicit drugs.    Allergies  Allergen Reactions  . Sulfa  Antibiotics Itching    Medications Prior to Admission  Medication Sig Dispense Refill  . amLODipine (NORVASC) 10 MG tablet take 1 tablet by mouth once daily 30 tablet 0  . Cholecalciferol (VITAMIN D3) 50000 UNITS CAPS Take 1 capsule by mouth 3 (three) times a week. 30 capsule 3  . ferrous sulfate 325 (65 FE) MG tablet Take 325 mg by mouth daily with breakfast.    . multivitamin (RENA-VIT) TABS tablet Take 1 tablet by mouth daily.    . mycophenolate (CELLCEPT) 250 MG capsule Take 1,000 mg by mouth 2 (two) times daily.    . nicotine (NICODERM CQ - DOSED IN MG/24 HOURS) 14 mg/24hr patch Place 1 patch onto the skin daily.    . cyclobenzaprine (FLEXERIL) 5 MG tablet Take 1 tablet (5 mg total) by mouth 3 (three) times daily as needed for muscle spasms. 30 tablet 0    Home: Home Living Family/patient expects to be discharged to:: Inpatient rehab Living Arrangements: Children Available Help at Discharge: Family, Available 24 hours/day Type of Home: House Home Access: Stairs to enter [employer] of Steps: 4 Entrance Stairs-Rails: Right Home Layout: Two level, Able to  live on main level with bedroom/bathroom Home Equipment: Dan Humphreys - standard  Lives With: Son, Daughter   Functional History: Prior Function Level of Independence: Independent 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 Hobbies include spending time with family and going out to eat, play candy crush  Functional Status:  Mobility: Bed Mobility Overal bed mobility: Needs Assistance Bed Mobility: Supine to Sit Supine to sit: Min guard Sit to supine: Max assist, +2 for physical assistance General bed mobility comments: Vcs for positioning at EOB Transfers Overall transfer level: Needs assistance Equipment used: Rolling walker (2 wheeled) Transfers: Sit to/from Stand, [employer] Transfers Sit to Stand: Min assist, +2 physical assistance Stand pivot transfers: Mod assist General transfer comment: assist to power to  upright, VCs for hand placement and positioning Ambulation/Gait Ambulation/Gait assistance: Min assist, +2 physical assistance Ambulation Distance (Feet): 60 Feet Assistive device: Rolling walker (2 wheeled) Gait Pattern/deviations: Ataxic, Step-through pattern, Decreased stride length, Drifts right/left, Narrow base of support Gait velocity: decreased Gait velocity interpretation: Below normal speed for age/gender General Gait Details: Gait retraining with tactile facilitation of LE position from heel strike through mid stance with attention to posture and postioning to address hyperextension LLE.  +2 faciliation for hip and trunk control    ADL: ADL Overall ADL's : Needs assistance/impaired Eating/Feeding: Set up, Bed level Grooming: Wash/dry hands, Wash/dry face, Oral care, Minimal assistance, Standing Upper Body Bathing: Minimal assitance, Sitting Lower Body Bathing: Moderate assistance, Sit to/from stand Upper Body Dressing : Minimal assistance, Sitting Lower Body Dressing: Moderate assistance, Sit to/from stand Lower Body Dressing Details (indicate cue type and reason): Pt able to don/doff socks.  Dependent upon UE support for standing  Toilet Transfer: Minimal assistance, +2 for physical assistance Toilet Transfer Details (indicate cue type and reason): facilitation for knee control and hip extension as well as trunk control  Toileting- Clothing Manipulation and Hygiene: Moderate assistance, Sitting/lateral lean Functional mobility during ADLs: Minimal assistance, +2 for physical assistance General ADL Comments: Pt very motivated  Cognition: Cognition Overall Cognitive Status: Within Functional Limits for tasks assessed Orientation Level: Oriented X4 Cognition Arousal/Alertness: Awake/alert Behavior During Therapy: WFL for tasks assessed/performed Overall Cognitive Status: Within Functional Limits for tasks assessed Current Attention Level: Selective Safety/Judgement:  Decreased awareness of safety, Decreased awareness of deficits Awareness: Emergent Problem Solving: Slow processing  Physical Exam: Blood pressure 176/72, pulse 65, temperature 98.3 F (36.8 C), temperature source Oral, resp. rate 18, height 5' 5.5" (1.664 m), weight 70.6 kg (155 lb 10.3 oz), last menstrual period 07/18/2012, SpO2 100 %. Physical Exam  Nursing note and vitals reviewed. Constitutional: She is oriented to person, place, and time. She appears well-developed and well-nourished.  HENT:  Head: Normocephalic and atraumatic.  Right Ear: External ear normal.  Left Ear: External ear normal.  Eyes: Conjunctivae are normal. Pupils are equal, round, and reactive to light.  Neck: Normal range of motion. Neck supple.  Cardiovascular: Normal rate and regular rhythm.   Murmur (systolic murmur) heard. Respiratory: Effort normal and breath sounds normal. No respiratory distress. She has no wheezes. She has no rales.  GI: Soft. Bowel sounds are normal. She exhibits no distension. There is tenderness. There is rebound.  Large area of dependent fluid collection LLQ?   Musculoskeletal: She exhibits no edema or tenderness.  Mild joint pain in hands, feet with palpation. No obvious joint irregularities   Neurological: She is alert and oriented to person, place, and time.  Alert without lethargy. Speech clear. Attending to left without difficulty. Moves  all four. No obvious cranial nerve abnormalities. Strength appears symmetrical. UE: 4/5 deltoid, bicep, tricep to 5/5 HI, LE: 4/5 HF, KE and 5/5 ankles. Good insight and awareness  Skin: Skin is warm and dry.  Psychiatric: She has a normal mood and affect. Her behavior is normal.    Results for orders placed or performed during the hospital encounter of 10/10/14 (from the past 48 hour(s))  Glucose, capillary     Status: Abnormal   Collection Time: 10/14/14 11:48 AM  Result Value Ref Range   Glucose-Capillary 192 (H) 70 - 99 mg/dL  Glucose,  capillary     Status: Abnormal   Collection Time: 10/14/14  4:56 PM  Result Value Ref Range   Glucose-Capillary 299 (H) 70 - 99 mg/dL  Glucose, capillary     Status: Abnormal   Collection Time: 10/14/14  9:20 PM  Result Value Ref Range   Glucose-Capillary 261 (H) 70 - 99 mg/dL  Renal function panel     Status: Abnormal   Collection Time: 10/15/14  5:24 AM  Result Value Ref Range   Sodium 136 (L) 137 - 147 mEq/L   Potassium 3.8 3.7 - 5.3 mEq/L   Chloride 93 (L) 96 - 112 mEq/L   CO2 26 19 - 32 mEq/L   Glucose, Bld 231 (H) 70 - 99 mg/dL   BUN 94 (H) 6 - 23 mg/dL   Creatinine, Ser 3.26 (H) 0.50 - 1.10 mg/dL   Calcium 9.4 8.4 - 10.5 mg/dL   Phosphorus 4.6 2.3 - 4.6 mg/dL   Albumin 1.9 (L) 3.5 - 5.2 g/dL   GFR calc non Af Amer 15 (L) >90 mL/min   GFR calc Af Amer 18 (L) >90 mL/min    Comment: (NOTE) The eGFR has been calculated using the CKD EPI equation. This calculation has not been validated in all clinical situations. eGFR's persistently <90 mL/min signify possible Chronic Kidney Disease.    Anion gap 17 (H) 5 - 15  Glucose, capillary     Status: Abnormal   Collection Time: 10/15/14  7:50 AM  Result Value Ref Range   Glucose-Capillary 298 (H) 70 - 99 mg/dL  Glucose, capillary     Status: Abnormal   Collection Time: 10/15/14 12:08 PM  Result Value Ref Range   Glucose-Capillary 232 (H) 70 - 99 mg/dL  Glucose, capillary     Status: Abnormal   Collection Time: 10/15/14  5:31 PM  Result Value Ref Range   Glucose-Capillary 381 (H) 70 - 99 mg/dL  Glucose, capillary     Status: Abnormal   Collection Time: 10/15/14  9:46 PM  Result Value Ref Range   Glucose-Capillary 324 (H) 70 - 99 mg/dL   Comment 1 Documented in Chart    Comment 2 Notify RN   Glucose, capillary     Status: Abnormal   Collection Time: 10/16/14 12:59 AM  Result Value Ref Range   Glucose-Capillary 233 (H) 70 - 99 mg/dL  Basic metabolic panel     Status: Abnormal   Collection Time: 10/16/14  5:47 AM  Result  Value Ref Range   Sodium 137 137 - 147 mEq/L   Potassium 3.9 3.7 - 5.3 mEq/L   Chloride 95 (L) 96 - 112 mEq/L   CO2 26 19 - 32 mEq/L   Glucose, Bld 180 (H) 70 - 99 mg/dL   BUN 93 (H) 6 - 23 mg/dL   Creatinine, Ser 3.37 (H) 0.50 - 1.10 mg/dL   Calcium 9.5 8.4 - 10.5 mg/dL  GFR calc non Af Amer 15 (L) >90 mL/min   GFR calc Af Amer 17 (L) >90 mL/min    Comment: (NOTE) The eGFR has been calculated using the CKD EPI equation. This calculation has not been validated in all clinical situations. eGFR's persistently <90 mL/min signify possible Chronic Kidney Disease.    Anion gap 16 (H) 5 - 15  Glucose, capillary     Status: Abnormal   Collection Time: 10/16/14  7:50 AM  Result Value Ref Range   Glucose-Capillary 167 (H) 70 - 99 mg/dL   No results found.     Medical Problem List and Plan: 1. 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  2.  DVT Prophylaxis/Anticoagulation: Mechanical:  Antiembolism stockings, knee (TED hose) Bilateral lower extremities Sequential compression devices, below knee Bilateral lower extremities 3. Pain Management: flexeril prn for spasms. rx of connective tissue disorder/rx with prednsione 4. Mood: team to provide ego support as needed. Appears to be in good spirits at present 5. Neuropsych: This patient is capable of making decisions on her own behalf. 6. Skin/Wound Care: ensure appropriate nutrition and skin pressure relief measures 7. Fluids/Electrolytes/Nutrition: encourage PO intake. labwork on admit 8. Acute on chronic renal failure---baseline Cr around 3.3.  -cellcept resumed, prednisone for now 9. Mixed Connective Tissue Disorder/RA-  -pain much better controlled after steroid burst  -outpt rheum follow up  -plaquenil  -prednisone taper to begin next week 10. Likely hemolytic anemia  -continue to monitor blood  counts  -immuno-supressive therapy as above 11. Steroid induced hyperglycemia:  -SSI 12. Malignant hypertension:   -lasix, catapres, labetalol, imdur  -further adjustments as possible      Post Admission Physician Evaluation: 1. Functional deficits secondary  to recent pontine hemorrhage complicated by flare of connective tissue disease/RA 2. Patient is admitted to receive collaborative, interdisciplinary care between the physiatrist, rehab nursing staff, and therapy team. 3. Patient's level of medical complexity and substantial therapy needs in context of that medical necessity cannot be provided at a lesser intensity of care such as a SNF. 4. Patient has experienced substantial functional loss from his/her baseline which was documented above under the "Functional History" and "Functional Status" headings.  Judging by the patient's diagnosis, physical exam, and functional history, the patient has potential for functional progress which will result in measurable gains while on inpatient rehab.  These gains will be of substantial and practical use upon discharge  in facilitating mobility and self-care at the household level. 5. Physiatrist will provide 24 hour management of medical needs as well as oversight of the therapy plan/treatment and provide guidance as appropriate regarding the interaction of the two. 6. 24 hour rehab nursing will assist with bladder management, bowel management, safety, skin/wound care, disease management, medication administration, pain management and patient education  and help integrate therapy concepts, techniques,education, etc. 7. PT will assess and treat for/with: Lower extremity strength, range of motion, stamina, balance, functional mobility, safety, adaptive techniques and equipment, pain mgt, ego support, community reintegration.   Goals are: mod I. 8. OT will assess and treat for/with: ADL's, functional mobility, safety, upper extremity strength, adaptive  techniques and equipment, pain mgt, ego suppoprt, leisure awareness, community reintegration.   Goals are: mod I. Therapy may proceed with showering this patient. 9. SLP will assess and treat for/with: cognition, communication.  Goals are: mod I 10. Case Management and Social Worker will assess and treat for psychological  issues and discharge planning. 11. Team conference will be held weekly to assess progress toward goals and to determine barriers to discharge. 12. Patient will receive at least 3 hours of therapy per day at least 5 days per week. 13. ELOS: 10 days       14. Prognosis:  excellent     Meredith Staggers, MD, [place] 10/17/2014   10/16/2014

## 2014-10-17 NOTE — Interval H&P Note (Signed)
[name] [name] was admitted today to Inpatient Rehabilitation with the diagnosis of mixed connective tissue disorder, recent pontine hemorrhage.  The patient's history has been reviewed, patient examined, and there is no change in status.  Patient continues to be appropriate for intensive inpatient rehabilitation.  I have reviewed the patient's chart and labs.  Questions were answered to the patient's satisfaction.  Min Collymore T 10/17/2014, 7:52 PM

## 2014-10-17 NOTE — Progress Notes (Signed)
On-call provider notified of SBP >160. No distress noted at this time. All PM BP meds were given. Will await any new orders and continue to monitor.

## 2014-10-17 NOTE — Discharge Summary (Signed)
Physician Discharge Summary  [name] [name] HKV:[id number] DOB: 1961-08-14 DOA: 10/10/2014  PCP: No primary care provider on file.  Admit date: 10/10/2014 Discharge date: 10/17/2014  Time spent: 40 minutes  Recommendations for Outpatient Follow-up:  1. Follow-up with primary care physician within one week. 2. Continue to follow pressure closely, reconsult with nephrology if blood pressure continues to be in the high side. 3. Please arrange follow-up with rheumatology as outpatient in 1-2 weeks after discharge  Discharge Diagnoses:  Principal Problem:   Sepsis Active Problems:   Iron deficiency anemia   Rheumatoid arthritis   Pontine hemorrhage   ICH (intracerebral hemorrhage)   Essential hypertension   Acute renal failure superimposed on stage 4 chronic kidney disease   Hyperkalemia   Acute encephalopathy   Membranous glomerulonephritis   Acute blood loss anemia   Acute on chronic renal failure   Hyponatremia   Hyperglycemia   MCTD (mixed connective tissue disease)   SLE (systemic lupus erythematosus)   Secondary cardiomyopathy   Noncompliance with medication regimen   Tobacco abuse   Discharge Condition: Stable  Diet recommendation: Heart healthy  Filed Weights   10/11/14 0006 10/12/14 0000 10/17/14 0954  Weight: 65.8 kg (145 lb 1 oz) 70.6 kg (155 lb 10.3 oz) 71.169 kg (156 lb 14.4 oz)    History of present illness:  53 year old female with a history of hypertension, hyperlipidemia, 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 Workup showed nonsignificant carotid stenosis and 2-D echo noted severe LVH. During that hospitalization she developed mild pulmonary edema which improved with diuresis. Follow up head CT and MRI brain showed improvement in the size of hemorrhage.  Patient was discharged to inpatient rehabilitation for ongoing physical therapy on 12/3.   Patient reported she had not been participating in rehabilitation much due to worsening pain in her knees and ankles. Blood work revealed acute leukocytosis and progressive drop in H&H (11>9>8>6.1). Stool for occult blood done was negative. Chemistries noted anion gap metabolic acidosis with hyperkalemia and worsening renal function. Reportedly patient had poor by mouth intake. Patient had been reporting pain in her neck, worsened wrists, elbows, bilateral knee and ankle pain.  Hospital Course:   SIRS No evidence of active infection - most c/w connective tissue disease flair. SIRS physiology improving - stop abx and follow. Patient does not show any symptoms or signs of infection off antibiotics.  Diptheroids in 1/2 blood cx Most c/w contaminant - no indication for abx tx for this issue. No evidence of infection.   Malignant hypertension Blood pressure medications were held at the time of admission including lisinopril because of worsening renal function. Was on amlodipine 10 mg, Coreg 25 mg, clonidine 0.1 mg and Imdur 60 mg. ACEI/ARB cannot be started because of worsening renal function, try to avoid hydralazine because of lupus. Blood pressure still in the high side, discontinue Coreg and restarted labetalol. Discussed with Dr. Darrick Penna, recommended to start 80 mg of Lasix twice a day. It will probably take some time for the blood pressure to improve, recommended to increase labetalol if still high in 1-2 days. If blood pressure continue to be high, reconsult nephrology please.  Acute anemia progressive drop in her Hgb from 11.7 to 6.1 - stool occult blood negative - transfused 2 units PRBC - Hgb improved accordingly - follow trend - Fe studies c/w ACD  Right pontine hemorrhage with acute encephalopathy and ? New RUE weakness acute encephalopathy and right upper extremity weakness seems new - had  residual left upper extremity weakness during recent hospitalization - follow-up MRI brain w/o acute findings. PT/OT recommended return to [facility]  Acute renal failure superimposed on stage 4 chronic kidney disease acute worsening of her underlying chronic kidney disease - baseline appears to be 3.2-3.5. Lasix and ACE inhibitor discontinued. CellCept was held and prednisone was started. Renal ultrasound showed no acute abnormalities. At the time of discharge restarted CellCept and tapered prednisone to be discontinued in about a week.  Hyperkalemia This is resolved.  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 Angeline Slim as outpt until one year ago. CellCept held secondary to acute renal failure - patient both RF and CCP positive which is a bad prognostic sign. Patient was started on prednisone, on discharge CellCept restarted and prednisone tapered. On discharge patient will need ophthalmology appointment to obtain baseline and f/u appointment w/ Rheumatologist    Tobacco abuse Counseled on sequela of continued tobacco abuse to include increased risk of stroke, MI, death  Procedures:  11/05/23 EEG was normal.  Consultations:  Nephrology.  Gastroenterology.  Discharge Exam: Filed Vitals:   10/17/14 1004  BP: 183/65  Pulse: 70  Temp:   Resp:    General: Alert and awake, oriented x3, not in any acute distress. HEENT: anicteric sclera, pupils reactive to light and accommodation, EOMI CVS: S1-S2 clear, no murmur rubs or gallops Chest: clear to auscultation  bilaterally, no wheezing, rales or rhonchi Abdomen: soft nontender, nondistended, normal bowel sounds, no organomegaly Extremities: no cyanosis, clubbing or edema noted bilaterally Neuro: Cranial nerves II-XII intact, no focal neurological deficits  Discharge Instructions You were cared for by a hospitalist during your hospital stay. If you have any questions about your discharge medications or the care you received while you were in the hospital after you are discharged, you can call the unit and asked to speak with the hospitalist on call if the hospitalist that took care of you is not available. Once you are discharged, your primary care physician will handle any further medical issues. Please note that NO REFILLS for any discharge medications will be authorized once you are discharged, as it is imperative that you return to your primary care physician (or establish a relationship with a primary care physician if you do not have one) for your aftercare needs so that they can reassess your need for medications and monitor your lab values.  Discharge Instructions    Diet - low sodium heart healthy    Complete by:  As directed      Increase activity slowly    Complete by:  As directed           Current Discharge Medication List    START taking these medications   Details  cloNIDine (CATAPRES) 0.1 MG tablet Take 1 tablet (0.1 mg total) by mouth 3 (three) times daily. Qty: 60 tablet, Refills: 11    furosemide (LASIX) 80 MG tablet Take 1 tablet (80 mg total) by mouth 2 (two)  times daily.    labetalol (NORMODYNE) 200 MG tablet Take 1 tablet (200 mg total) by mouth 2 (two) times daily.    predniSONE (DELTASONE) 20 MG tablet Take 2 tablets (40 mg total) by mouth daily with breakfast.      CONTINUE these medications which have NOT CHANGED   Details  amLODipine (NORVASC) 10 MG tablet take 1 tablet by mouth once daily Qty: 30 tablet, Refills: 0    Cholecalciferol (VITAMIN D3) 50000 UNITS  CAPS Take 1 capsule by mouth 3 (three) times a week. Qty: 30 capsule, Refills: 3    ferrous sulfate 325 (65 FE) MG tablet Take 325 mg by mouth daily with breakfast.    multivitamin (RENA-VIT) TABS tablet Take 1 tablet by mouth daily.    mycophenolate (CELLCEPT) 250 MG capsule Take 1,000 mg by mouth 2 (two) times daily.    cyclobenzaprine (FLEXERIL) 5 MG tablet Take 1 tablet (5 mg total) by mouth 3 (three) times daily as needed for muscle spasms. Qty: 30 tablet, Refills: 0      STOP taking these medications     nicotine (NICODERM CQ - DOSED IN MG/24 HOURS) 14 mg/24hr patch        Allergies  Allergen Reactions  . Sulfa Antibiotics Itching   Follow-up Information    Follow up with Garnetta Buddy, MD In 1 week.   Specialty:  Nephrology   Contact information:   [address] [place] [address] [phone]        The results of significant diagnostics from this hospitalization (including imaging, microbiology, ancillary and laboratory) are listed below for reference.    Significant Diagnostic Studies: Dg Cervical Spine With Flex & Extend  10/10/2014   CLINICAL DATA:  Full body pain, no known injury.Pt has pain and swelling on the posterior portion of both of her hands. Her head is "heavy" and tilts to the right, with extreme neck pain. Both ankles are in pain and are stiff.Hx of Stroke 1 week ago, left sided weakness as a result. Pt also has a Hx of Lupus and a Fx on her right foot as a result Chronic ankle pain  EXAM: CERVICAL SPINE COMPLETE WITH FLEXION AND EXTENSION VIEWS  COMPARISON:  MR 03/12/2006 and companion radiographs  FINDINGS: No evidence of dynamic instability on lateral flexion-extension. There is narrowing of the C5-6 interspace with small anterior endplate spurs. Negative for fracture or dislocation. The cervicothoracic junction is not well visualized. No swimmer's view was submitted.  IMPRESSION: 1. Negative for fracture or other acute bone abnormality. 2. No dynamic  instability. 3. Degenerative disc disease C5-6.   Electronically Signed   By: Oley Balm M.D.   On: 10/10/2014 19:16   Dg Wrist Complete Left  10/10/2014   CLINICAL DATA:  Full body pain, no known injury.Pt has pain and swelling on the posterior portion of both of her hands. Her head is "heavy" and tilts to the right, with extreme neck pain. Both ankles are in pain and are stiff.Hx of Stroke 1 week ago, left sided weakness as a result. Pt also has a Hx of Lupus and a Fx on her right foot as a result  EXAM: LEFT WRIST - COMPLETE 3+ VIEW  COMPARISON:  None.  FINDINGS: There is no evidence of fracture or dislocation. There is no evidence of arthropathy or other focal bone abnormality. Soft tissues are unremarkable.  IMPRESSION: Negative.   Electronically Signed   By: Oley Balm M.D.   On: 10/10/2014 19:06  Dg Wrist Complete Right  10/10/2014   CLINICAL DATA:  Full body pain, no known injury.Pt has pain and swelling on the posterior portion of both of her hands. Her head is "heavy" and tilts to the right, with extreme neck pain. Both ankles are in pain and are stiff.Hx of Stroke 1 week ago, left sided weakness as a result. Pt also has a Hx of Lupus and a Fx on her right foot as a result Chronic ankle pain  EXAM: RIGHT WRIST - COMPLETE 3+ VIEW  COMPARISON:  None.  FINDINGS: There is no evidence of fracture or dislocation. There is no evidence of arthropathy or other focal bone abnormality. Soft tissues are unremarkable. Carpal rows intact.  IMPRESSION: Negative.   Electronically Signed   By: Oley Balm M.D.   On: 10/10/2014 19:18   Dg Knee 1-2 Views Left  10/10/2014   CLINICAL DATA:  Bilateral knee pain for several days, no known injury  EXAM: LEFT KNEE - 1-2 VIEW  COMPARISON:  None.  FINDINGS: Two views of the left knee submitted. No acute fracture or subluxation. Minimal narrowing of medial joint compartment. Small joint effusion.  IMPRESSION: No acute fracture or subluxation. Minimal narrowing  of medial joint compartment. Small joint effusion.   Electronically Signed   By: Natasha Mead M.D.   On: 10/10/2014 14:41   Dg Knee 1-2 Views Right  10/10/2014   CLINICAL DATA:  Bilateral knee pain for several days, no known injury  EXAM: RIGHT KNEE - 1-2 VIEW  COMPARISON:  None.  FINDINGS: Two views of the right knee submitted. No acute fracture or subluxation. Mild narrowing of medial joint compartment. Small joint effusion.  IMPRESSION: No acute fracture or subluxation. Mild narrowing of medial joint compartment. Small joint effusion.   Electronically Signed   By: Natasha Mead M.D.   On: 10/10/2014 14:40   Dg Ankle Complete Left  10/10/2014   CLINICAL DATA:  Full body pain, no known injury.Pt has pain and swelling on the posterior portion of both of her hands. Her head is "heavy" and tilts to the right, with extreme neck pain. Both ankles are in pain and are stiff.Hx of Stroke 1 week ago, left sided weakness as a result. Pt also has a Hx of Lupus and a Fx on her right foot as a result Chronic ankle pain  EXAM: LEFT ANKLE COMPLETE - 3+ VIEW  COMPARISON:  MR 06/24/2007  FINDINGS: Small dorsal spurs from the distal talus and navicular. Calcaneal spur at the Achilles tendon insertion. Negative for fracture. Normal alignment. Mild diffuse osteopenia.  IMPRESSION: 1. Negative for fracture or other acute bone abnormality. 2. Osteopenia and chronic/degenerative changes as above   Electronically Signed   By: Oley Balm M.D.   On: 10/10/2014 19:12   Dg Ankle Complete Right  10/10/2014   CLINICAL DATA:  Full body pain, no known injury.Pt has pain and swelling on the posterior portion of both of her hands. Her head is "heavy" and tilts to the right, with extreme neck pain. Both ankles are in pain and are stiff.Hx of Stroke 1 week ago, left sided weakness as a result. Pt also has a Hx of Lupus and a Fx on her right foot as a result Chronic ankle pain  EXAM: RIGHT ANKLE - COMPLETE 3+ VIEW  COMPARISON:  09/04/2012   FINDINGS: There is no evidence of fracture, dislocation, or joint effusion. Interval fusion between the navicular and at least 1 of the cuneiforms. Small dorsal  spurs from the distal talus and navicular. Small calcaneal spur at the Achilles tendon insertion. There is no evidence of other focal bone abnormality. Soft tissues are unremarkable.  IMPRESSION: 1. Negative for fracture or other acute abnormality. 2. Interval midfoot fusion with small dorsal degenerative spur formation.   Electronically Signed   By: Oley Balm M.D.   On: 10/10/2014 19:14   Ct Head Wo Contrast  10/04/2014   CLINICAL DATA:  Follow-up exam for intracranial hemorrhage, left-sided weakness  EXAM: CT HEAD WITHOUT CONTRAST  TECHNIQUE: Contiguous axial images were obtained from the base of the skull through the vertex without intravenous contrast.  COMPARISON:  Prior MRI from 10/14/2014 and CT from 10/02/2014.  FINDINGS: There has been continued interval evolution of right paramedian hemorrhagic focus within the pons, which is slightly less dense in appearance as compared to prior CT from 10/02/2014. This hemorrhage measures or size at 9-10 mm AP, and 7 mm transverse. This is slightly decreased in size. No significant edema. The adjacent fourth ventricle and basilar cisterns remain widely patent. No hydrocephalus.  No new intracranial hemorrhage or infarct. Chronic microvascular ischemic changes again noted. No midline shift. No extra-axial fluid collection.  No acute abnormality seen about the orbits. Scalp soft tissues within normal limits. Calvarium intact.  Paranasal sinuses and mastoid air cells remain clear  IMPRESSION: 1. Continued interval evolution of resolving parenchymal hemorrhage within the right paramedian pons, which is slightly decreased in size and less dense as compared to 10/02/14. No hydrocephalus. 2. No new intracranial process. 3. Chronic microvascular ischemic disease.   Electronically Signed   By: Rise Mu M.D.   On: 10/04/2014 05:41   Ct Head Wo Contrast  10/02/2014   CLINICAL DATA:  Brainstem hemorrhage. Acute onset of left-sided weakness gaze preference. Hypertension.  EXAM: CT HEAD WITHOUT CONTRAST  TECHNIQUE: Contiguous axial images were obtained from the base of the skull through the vertex without intravenous contrast.  COMPARISON:  CT head without contrast 09/28/2014.  FINDINGS: A 9.5 mm hyperdense lesion in the right paramedian posterior pons is slightly less distinct than on the prior exam, compatible with hemorrhage. There is mild surrounding edema. The fourth ventricle is intact.  Scattered subcortical white matter hypoattenuation is advanced for age. No other hemorrhage or mass lesion is present. The cortex is intact without evidence for acute infarct. The basal ganglia are intact.  The paranasal sinuses and mastoid air cells are clear. The osseous skull is intact.  IMPRESSION: 1. Slight decrease in size of a hyperdense right paramedian posterior pontine lesion compatible with resolving hemorrhage. Recommend continued follow-up to assure resolution of the hyperdense lesion compatible with hemorrhage. 2. Mild subcortical white matter hypoattenuation is advanced for age.   Electronically Signed   By: Gennette Pac M.D.   On: 10/02/2014 12:57   Ct Head (brain) Wo Contrast  09/28/2014   CLINICAL DATA:  Code stroke. Ran out of hypertensive Medicine several days ago.  EXAM: CT HEAD WITHOUT CONTRAST  TECHNIQUE: Contiguous axial images were obtained from the base of the skull through the vertex without intravenous contrast.  COMPARISON:  None.  FINDINGS: There is mild low attenuation within the subcortical white matter of the right frontoparietal lobe compatible with chronic microvascular disease. Within the pons there is a 1.1 cm hyper dense mass, image number 8/ series 2. This is consistent with pontine hemorrhage. No additional areas of hemorrhage identified. The ventricular volumes and  sulci appear within normal limits. Paranasal sinuses and mastoid air  cells are clear. The skull appears intact.  IMPRESSION: 1. 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 2. Chronic microvascular disease 3. Critical Value/emergent results were called by telephone at the time of interpretation on 09/28/2014 at 10:12 pm to Dr. Noel Christmas, who verbally acknowledged these results.   Electronically Signed   By: Signa Kell M.D.   On: 09/28/2014 22:13   Mr Maxine Glenn Head Wo Contrast  10/03/2014   CLINICAL DATA:  Acute onset of left-sided weakness and numbness with right-sided gaze. Abnormal CT scan with a focal brainstem hemorrhage.  EXAM: MRI HEAD WITHOUT CONTRAST  MRA HEAD WITHOUT CONTRAST  TECHNIQUE: Multiplanar, multiecho pulse sequences of the brain and surrounding structures were obtained without intravenous contrast. Angiographic images of the head were obtained using MRA technique without contrast.  COMPARISON:  CT head without contrast 10/02/2014  FINDINGS: MRI HEAD FINDINGS  A focal area of susceptibility is evident within the right paramedian pons compatible with hemorrhage. There is surrounding edema. No discrete mass lesion is evident. There is intermediate to high T1 signal compatible with acute blood products. The fourth ventricle is intact. No additional hemorrhage is present.  3 punctate areas of focal diffusion signal abnormality are evident thumb height medial left frontal lobe, just above the cingulate gyrus. These are adjacent to an area of focal calcification, likely representing a small meningioma. No other diffusion abnormalities are present.  Mild generalized atrophy and white matter changes are present bilaterally, advanced for age.  Flow is present in the major intracranial arteries. The globes and orbits are intact. Midline structures are within normal limits. Mild mucosal thickening is present in the maxillary sinuses bilaterally.  MRA HEAD FINDINGS  The internal carotid  arteries are within normal limits from the high cervical segments through the ICA termini bilaterally. The A1 and M1 segments are normal. Anterior communicating artery is patent. The MCA bifurcations are intact. There is some attenuation of distal small vessels bilaterally.  The right vertebral artery is the dominant vessel. There is mild irregularity of the distal left vertebral artery without a focal stenosis. The right PICA origin is visualized and normal. Bilateral AICA vessels are evident. The basilar artery is normal. Both posterior cerebral arteries originate the basilar tip. There is mild attenuation of distal PCA branch vessels.  IMPRESSION: 1. Signal changes compatible with acute/subacute hemorrhage in the right paramedian pons. No underlying mass lesion is evident. 2. Age advanced atrophy and white matter disease likely reflects the sequela of chronic microvascular ischemia. 3. Minimal diffusion abnormality in the medial high frontal lobes bilaterally is likely related to the adjacent calcification and probable meningioma. Punctate cortical infarcts in addition to the brainstem hemorrhage are considered less likely. 4. Mild distal small vessel disease without significant proximal stenosis, aneurysm, or branch vessel occlusion.   Electronically Signed   By: Gennette Pac M.D.   On: 10/03/2014 10:19   Mr Brain Wo Contrast  10/11/2014   CLINICAL DATA:  Recent pontine infarct, residual RIGHT upper extremity weakness. Subsequent encounter.  EXAM: MRI HEAD WITHOUT CONTRAST  TECHNIQUE: Multiplanar, multiecho pulse sequences of the brain and surrounding structures were obtained without intravenous contrast.  COMPARISON:  MR brain 10/03/2014. CT head 10/02/2014. CT head 09/28/2014.  FINDINGS: Redemonstrated is a RIGHT paramedian pontine hemorrhage. There is both T1 and T2 shortening consistent with a subacute time course. Mild surrounding edema is slightly diminishing. No new areas of hemorrhage.  Slight  premature for age cerebral and cerebellar atrophy. Moderate T2 and FLAIR hyperintensity of  the periventricular and subcortical white matter, also affecting the brainstem, consistent with chronic microvascular ischemic change.  Minor DWI susceptibility artifact related to falx calcification is redemonstrated in the LEFT frontal parasagittal cortex, and does not represent acute infarction.  Flow voids are maintained. Slight hypercellular marrow could relate to anemia.  IMPRESSION: Findings consistent with expected time course of the previously demonstrated brainstem hemorrhage, now subacute. Slight diminution surrounding edema.   Electronically Signed   By: Davonna Belling M.D.   On: 10/11/2014 08:29   Mr Brain Wo Contrast  10/03/2014   CLINICAL DATA:  Acute onset of left-sided weakness and numbness with right-sided gaze. Abnormal CT scan with a focal brainstem hemorrhage.  EXAM: MRI HEAD WITHOUT CONTRAST  MRA HEAD WITHOUT CONTRAST  TECHNIQUE: Multiplanar, multiecho pulse sequences of the brain and surrounding structures were obtained without intravenous contrast. Angiographic images of the head were obtained using MRA technique without contrast.  COMPARISON:  CT head without contrast 10/02/2014  FINDINGS: MRI HEAD FINDINGS  A focal area of susceptibility is evident within the right paramedian pons compatible with hemorrhage. There is surrounding edema. No discrete mass lesion is evident. There is intermediate to high T1 signal compatible with acute blood products. The fourth ventricle is intact. No additional hemorrhage is present.  3 punctate areas of focal diffusion signal abnormality are evident thumb height medial left frontal lobe, just above the cingulate gyrus. These are adjacent to an area of focal calcification, likely representing a small meningioma. No other diffusion abnormalities are present.  Mild generalized atrophy and white matter changes are present bilaterally, advanced for age.  Flow is present in  the major intracranial arteries. The globes and orbits are intact. Midline structures are within normal limits. Mild mucosal thickening is present in the maxillary sinuses bilaterally.  MRA HEAD FINDINGS  The internal carotid arteries are within normal limits from the high cervical segments through the ICA termini bilaterally. The A1 and M1 segments are normal. Anterior communicating artery is patent. The MCA bifurcations are intact. There is some attenuation of distal small vessels bilaterally.  The right vertebral artery is the dominant vessel. There is mild irregularity of the distal left vertebral artery without a focal stenosis. The right PICA origin is visualized and normal. Bilateral AICA vessels are evident. The basilar artery is normal. Both posterior cerebral arteries originate the basilar tip. There is mild attenuation of distal PCA branch vessels.  IMPRESSION: 1. Signal changes compatible with acute/subacute hemorrhage in the right paramedian pons. No underlying mass lesion is evident. 2. Age advanced atrophy and white matter disease likely reflects the sequela of chronic microvascular ischemia. 3. Minimal diffusion abnormality in the medial high frontal lobes bilaterally is likely related to the adjacent calcification and probable meningioma. Punctate cortical infarcts in addition to the brainstem hemorrhage are considered less likely. 4. Mild distal small vessel disease without significant proximal stenosis, aneurysm, or branch vessel occlusion.   Electronically Signed   By: Gennette Pac M.D.   On: 10/03/2014 10:19   US Renal  10/10/2014   CLINICAL DATA:  Acute kidney injury  EXAM: RENAL/URINARY TRACT ULTRASOUND COMPLETE  COMPARISON:  CT abdomen 09/12/2009  FINDINGS: Right Kidney:  Length: 10.6 cm. Increased renal cortical echogenicity. No mass or hydronephrosis visualized. Mild fullness of the right renal collecting system.  Left Kidney:  Length: 11.2 cm. Increased renal cortical echogenicity.  2.7 x 2.2 x 2.6 cm anechoic left renal mass with increased through transmission most consistent with a cyst. No hydronephrosis visualized.  IMPRESSION: 1. Increased bilateral renal cortical echogenicity as can be seen with medical renal disease versus acute kidney injury. No obstructive uropathy.   Electronically Signed   By: Elige Ko   On: 10/10/2014 17:42   Dg Chest Port 1 View  10/10/2014   CLINICAL DATA:  Sepsis, hypertension, shortness of Breath.  EXAM: PORTABLE CHEST - 1 VIEW  COMPARISON:  10/05/2014  FINDINGS: Stable moderate cardiomegaly. Slightly improved aeration with some residual subsegmental atelectasis in the lung bases. No definite effusion Visualized skeletal structures are unremarkable.  IMPRESSION: 1. Stable cardiomegaly. 2. Improved aeration with minimal residual bibasilar subsegmental atelectasis.   Electronically Signed   By: Oley Balm M.D.   On: 10/10/2014 12:09   Dg Chest Port 1 View  10/05/2014   CLINICAL DATA:  Acute respiratory acidosis.  EXAM: PORTABLE CHEST - 1 VIEW  COMPARISON:  10/04/2014  FINDINGS: No residual pulmonary edema. Probable small effusions with mild basilar atelectasis. Cardiac silhouette is mildly enlarged. Normal mediastinal and hilar contours. No pneumothorax.  IMPRESSION: Resolved pulmonary edema. Probable small residual pleural effusions with mild basilar atelectasis.   Electronically Signed   By: Amie Portland M.D.   On: 10/05/2014 08:00   Dg Chest Port 1 View  10/04/2014   CLINICAL DATA:  Intracranial hemorrhage.  EXAM: PORTABLE CHEST - 1 VIEW  COMPARISON:  10/03/2014.  FINDINGS: Mediastinum and hilar structures are normal. Cardiomegaly with mild pulmonary vascular prominence. Interim partial clearing of bilateral pulmonary edema. Small bilateral pleural effusions. No pneumothorax. No acute osseous abnormality .  IMPRESSION: Interim partial clearing of bilateral pulmonary edema.   Electronically Signed   By: Maisie Fus  Register   On: 10/04/2014 07:22    Dg Chest Port 1 View  10/03/2014   CLINICAL DATA:  Shortness of breath this morning.  EXAM: PORTABLE CHEST - 1 VIEW  COMPARISON:  09/29/2014  FINDINGS: The cardiac silhouette appears mildly enlarged. There is new pulmonary vascular congestion with interstitial and airspace opacities in the mid and lower lungs bilaterally. Both hemidiaphragms are obscured. Small bilateral pleural effusions are suspected. No pneumothorax is identified. No acute osseous abnormality is identified.  IMPRESSION: New pulmonary vascular congestion and bibasilar opacities consistent with pulmonary edema. Possible small bilateral pleural effusions.   Electronically Signed   By: Sebastian Ache   On: 10/03/2014 14:04   Chest Port 1 View  09/29/2014   CLINICAL DATA:  Stroke symptoms, shortness of breath  EXAM: PORTABLE CHEST - 1 VIEW  COMPARISON:  PA and lateral chest x-ray of January 07, 2011  FINDINGS: The lungs are adequately inflated. The left hemidiaphragm is better demonstrated today than in the past. The cardiac silhouette is top-normal in size. The pulmonary vascularity is not engorged. The mediastinum is normal in width. The trachea is midline.  IMPRESSION: There is no active cardiopulmonary disease. A PA and lateral chest x-ray would be useful when the patient can tolerate the procedure.   Electronically Signed   By: David  [place]   On: 09/29/2014 07:42   Dg Hand Complete Left  10/10/2014   CLINICAL DATA:  Full body pain, no known injury.Pt has pain and swelling on the posterior portion of both of her hands. Her head is "heavy" and tilts to the right, with extreme neck pain. Both ankles are in pain and are stiff.Hx of Stroke 1 week ago, left sided weakness as a result. Pt also has a Hx of Lupus and a Fx on her right foot as a result Chronic ankle pain  EXAM: LEFT HAND - COMPLETE 3+ VIEW  COMPARISON:  None.  FINDINGS: There is no evidence of fracture or dislocation. There is no evidence of arthropathy or other focal bone  abnormality. No osseous erosions. Soft tissues are unremarkable.  IMPRESSION: Negative.   Electronically Signed   By: Oley Balm M.D.   On: 10/10/2014 19:17   Dg Hand Complete Right  10/10/2014   CLINICAL DATA:  Full body pain, no known injury.Pt has pain and swelling on the posterior portion of both of her hands. Her head is "heavy" and tilts to the right, with extreme neck pain. Both ankles are in pain and are stiff.Hx of Stroke 1 week ago, left sided weakness as a result. Pt also has a Hx of Lupus and a Fx on her right foot as a result Chronic ankle pain  EXAM: RIGHT HAND - COMPLETE 3+ VIEW  COMPARISON:  None.  FINDINGS: There is no evidence of fracture or dislocation. There is no evidence of arthropathy or other focal bone abnormality. Soft tissues are unremarkable.  IMPRESSION: Negative.   Electronically Signed   By: Oley Balm M.D.   On: 10/10/2014 19:17    Microbiology: Recent Results (from the past 240 hour(s))  Culture, blood (routine x 2)     Status: None   Collection Time: 10/10/14  9:38 AM  Result Value Ref Range Status   Specimen Description BLOOD LEFT FOREARM  Final   Special Requests BOTTLES DRAWN AEROBIC ONLY 5CC  Final   Culture  Setup Time   Final    10/10/2014 14:39 Performed at [facility]    Culture   Final    NO GROWTH 5 DAYS Performed at [facility]    Report Status 10/16/2014 FINAL  Final  Culture, blood (routine x 2)     Status: None   Collection Time: 10/10/14  9:44 AM  Result Value Ref Range Status   Specimen Description BLOOD LEFT ARM  Final   Special Requests   Final    BOTTLES DRAWN AEROBIC AND ANAEROBIC 5CC BLUE  4CC RED   Culture  Setup Time   Final    10/10/2014 14:39 Performed at [facility]    Culture   Final    DIPHTHEROIDS(CORYNEBACTERIUM SPECIES) Note: Standardized susceptibility testing for this organism is not available. Note: Gram Stain Report Called to,Read Back By and Verified With: Caralee Ates RN  [phone] Performed at [facility]    Report Status 10/13/2014 FINAL  Final  MRSA PCR Screening     Status: None   Collection Time: 10/10/14 10:48 AM  Result Value Ref Range Status   MRSA by PCR NEGATIVE NEGATIVE Final    Comment:        The GeneXpert MRSA Assay (FDA approved for NASAL specimens only), is one component of a comprehensive MRSA colonization surveillance program. It is not intended to diagnose MRSA infection nor to guide or monitor treatment for MRSA infections.      Labs: Basic Metabolic Panel:  Recent Labs Lab 10/12/14 0251 10/12/14 2230 10/13/14 0316 10/14/14 0450 10/15/14 0524 10/16/14 0547  NA 131*  --  139 140 136* 137  K 4.5  --  4.1 3.7 3.8 3.9  CL 93*  --  94* 94* 93* 95*  CO2 19  --  27 29 26 26   GLUCOSE 236*  --  193* 206* 231* 180*  BUN 77*  --  82* 90* 94* 93*  CREATININE 4.19*  --  3.37*  3.27* 3.26* 3.37*  CALCIUM 9.1  --  9.2 9.1 9.4 9.5  MG  --  1.8  --   --   --   --   PHOS  --   --   --   --  4.6  --    Liver Function Tests:  Recent Labs Lab 10/11/14 0417 10/12/14 0251 10/13/14 0316 10/15/14 0524  AST 26 27 23   --   ALT 22 21 21   --   ALKPHOS 142* 145* 158*  --   BILITOT 0.7 0.5 0.3  --   PROT 6.8 6.8 6.7  --   ALBUMIN 1.6* 1.6* 1.6* 1.9*   No results for input(s): LIPASE, AMYLASE in the last 168 hours. No results for input(s): AMMONIA in the last 168 hours. CBC:  Recent Labs Lab 10/11/14 0417 10/12/14 0251 10/13/14 0316 10/14/14 0450  WBC 21.9* 19.9* 15.6* 11.6*  NEUTROABS  --   --  14.9*  --   HGB 8.9* 8.8* 9.0* 8.8*  HCT 26.0* 25.1* 26.6* 25.8*  MCV 81.8 78.7 78.9 82.7  PLT 217 231 256 267   Cardiac Enzymes:  Recent Labs Lab 10/10/14 1900  CKTOTAL 244*   BNP: BNP (last 3 results)  Recent Labs  10/05/14 0521  PROBNP 20673.0*   CBG:  Recent Labs Lab 10/16/14 1200 10/16/14 1706 10/16/14 2132 10/17/14 0748 10/17/14 1136  GLUCAP 231* 277* 263* 169* 246*        Signed:  Briyana Badman A  [facility] 10/17/2014, 12:48 PM

## 2014-10-18 ENCOUNTER — Inpatient Hospital Stay (HOSPITAL_COMMUNITY): Payer: Managed Care, Other (non HMO) | Admitting: Occupational Therapy

## 2014-10-18 ENCOUNTER — Encounter (HOSPITAL_COMMUNITY): Payer: Self-pay

## 2014-10-18 ENCOUNTER — Inpatient Hospital Stay (HOSPITAL_COMMUNITY): Payer: Managed Care, Other (non HMO) | Admitting: Speech Pathology

## 2014-10-18 ENCOUNTER — Inpatient Hospital Stay (HOSPITAL_COMMUNITY): Payer: Managed Care, Other (non HMO) | Admitting: Rehabilitation

## 2014-10-18 LAB — COMPREHENSIVE METABOLIC PANEL
ALBUMIN: 2.1 g/dL — AB (ref 3.5–5.2)
ALT: 24 U/L (ref 0–35)
AST: 17 U/L (ref 0–37)
Alkaline Phosphatase: 97 U/L (ref 39–117)
Anion gap: 15 (ref 5–15)
BUN: 94 mg/dL — AB (ref 6–23)
CO2: 25 mEq/L (ref 19–32)
Calcium: 9.3 mg/dL (ref 8.4–10.5)
Chloride: 98 mEq/L (ref 96–112)
Creatinine, Ser: 3.21 mg/dL — ABNORMAL HIGH (ref 0.50–1.10)
GFR calc Af Amer: 18 mL/min — ABNORMAL LOW (ref >90)
GFR calc non Af Amer: 16 mL/min — ABNORMAL LOW (ref >90)
Glucose, Bld: 172 mg/dL — ABNORMAL HIGH (ref 70–99)
POTASSIUM: 3.8 meq/L (ref 3.7–5.3)
Sodium: 138 mEq/L (ref 137–147)
TOTAL PROTEIN: 6.2 g/dL (ref 6.0–8.3)
Total Bilirubin: 0.3 mg/dL (ref 0.3–1.2)

## 2014-10-18 LAB — CBC WITH DIFFERENTIAL/PLATELET
BASOS ABS: 0 10*3/uL (ref 0.0–0.1)
BASOS PCT: 0 % (ref 0–1)
EOS ABS: 0 10*3/uL (ref 0.0–0.7)
Eosinophils Relative: 0 % (ref 0–5)
HEMATOCRIT: 26.2 % — AB (ref 36.0–46.0)
Hemoglobin: 8.4 g/dL — ABNORMAL LOW (ref 12.0–15.0)
LYMPHS PCT: 9 % — AB (ref 12–46)
Lymphs Abs: 1.2 10*3/uL (ref 0.7–4.0)
MCH: 26.6 pg (ref 26.0–34.0)
MCHC: 32.1 g/dL (ref 30.0–36.0)
MCV: 82.9 fL (ref 78.0–100.0)
MONO ABS: 0.5 10*3/uL (ref 0.1–1.0)
MONOS PCT: 4 % (ref 3–12)
Neutro Abs: 11.1 10*3/uL — ABNORMAL HIGH (ref 1.7–7.7)
Neutrophils Relative %: 87 % — ABNORMAL HIGH (ref 43–77)
PLATELETS: 263 10*3/uL (ref 150–400)
RBC: 3.16 MIL/uL — AB (ref 3.87–5.11)
RDW: 13.8 % (ref 11.5–15.5)
WBC: 12.8 10*3/uL — AB (ref 4.0–10.5)

## 2014-10-18 LAB — GLUCOSE, CAPILLARY
GLUCOSE-CAPILLARY: 158 mg/dL — AB (ref 70–99)
GLUCOSE-CAPILLARY: 194 mg/dL — AB (ref 70–99)
Glucose-Capillary: 245 mg/dL — ABNORMAL HIGH (ref 70–99)

## 2014-10-18 MED ORDER — CLONIDINE HCL 0.2 MG PO TABS
0.2000 mg | ORAL_TABLET | Freq: Three times a day (TID) | ORAL | Status: DC
  Administered 2014-10-18 – 2014-10-24 (×18): 0.2 mg via ORAL
  Filled 2014-10-18 (×21): qty 1

## 2014-10-18 NOTE — Progress Notes (Signed)
Occupational Therapy Assessment and Plan  Patient Details  Name: [name] [name] MRN: [id number] Date of Birth: 1961/01/13  OT Diagnosis: ataxia, hemiplegia affecting non-dominant side and coordination disorder Rehab Potential:  Good for stated goals ELOS: 7 days   Today's Date: 10/18/2014 OT Individual Time:  2229- 7989     60 minutes  Problem List:  Patient Active Problem List   Diagnosis Date Noted  . Acute blood loss anemia   . Acute on chronic renal failure   . Hyponatremia   . Hyperglycemia   . MCTD (mixed connective tissue disease)   . SLE (systemic lupus erythematosus)   . Secondary cardiomyopathy   . Noncompliance with medication regimen   . Tobacco abuse   . Sepsis 10/10/2014  . Leucocytosis 10/10/2014  . Fluid overload 10/10/2014  . Chronic kidney disease 10/10/2014  . SIRS (systemic inflammatory response syndrome) 10/10/2014  . Acute renal failure superimposed on stage 4 chronic kidney disease 10/10/2014  . Anemia 10/10/2014  . Hyperkalemia 10/10/2014  . Acute encephalopathy 10/10/2014  . Membranous glomerulonephritis   . Torticollis, acquired 10/06/2014  . Essential hypertension 10/05/2014  . Acute respiratory acidosis   . ICH (intracerebral hemorrhage)   . Chronic ankle pain   . Cerebral hemorrhage   . Malignant hypertension   . Stroke 09/28/2014  . 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 Hypertensive emergency 09/28/2014  . Iron deficiency anemia 02/18/2008  . 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 ADRENAL INSUFFICIENCY, HX OF 02/18/2008  . DIABETES MELLITUS, TYPE II 10/25/2007  . HYPERLIPIDEMIA 10/25/2007  . UNSPECIFIED ULCERATION OF VULVA 10/25/2007  . Systemic lupus erythematosus 10/25/2007  . Rheumatoid arthritis 10/25/2007  . HYPERTENSION NEC 10/25/2007  . COLONIC POLYPS, HYPERPLASTIC 04/08/2006  . HEMORRHOIDS 04/08/2006    Past Medical History:  Past Medical History  Diagnosis Date  . Hypertension   . Lupus   . CKD  (chronic kidney disease)     due to lupus/Dr. [name]  . Diabetes mellitus   . Stenosis of cervical spine region     with HNP at C5/6, C6/7  . Metatarsal bone fracture right    4th  . 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 Arthritis     RA  . Anemia   . Stroke    Past Surgical History:  Past Surgical History  Procedure Laterality Date  . Breast biopsy      Assessment & Plan Clinical Impression: Patient is a 53 y.o. year old female with history of HTN, hyperlipidemia, lupus with mild LLE weakness as well as compliance issues who was admitted on 09/28/14 with acute onset left sided weakness and numbness as well as gaze to the right side. Patient reported being out of BP meds for 3 weeks and BP 238/88 at admission. UDS negative. 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 Carotid dopplers significant for R-40-59% ICA stenosis. She was treated with IV diuresis for SOB due to fluid overload. Lisinopril was dicontinued due to worsening of renal status and she was treated with IV bicarb for acidosis. Repeat cardiac echo showed EF 60-70% with AV sclerosis without stenosis. FEES done yesterday showing mild dysphagia with pentration due to large boluses but no aspiration therefore she was started on regular textures with thin liquids. She continued to have complaints of neck, back  and ankle pain as well as issues with fluctuating bouts of lethargy. Follow up MRI/MRA brain done on 12/03 revealing changes c/w acute/subacute hemorrhage in right parmedian pons and no underlying mass evident. Minimal diffusion abnormality in high frontal lobes bilaterally likely due to calcification and probably meningioma.  Patient with resultant left sided weakness impacting mobility as well as safety.  CIR was recommended by rehab team and patient  was admitted for inpatient rehab program on 10/05/14. Heat alternating with ice was added to help with left neck and shoulder pain. Oxycodone was used additionally for ongoing issues with diffuse arthralgias. Blood pressures were monitored on TID basis and were resonably controlled. H/H was monitored with serial checks due to downward trend. Stool guaiac X 1 was negative but recheck labs on 12/8 showed further drop in H/H to 6.1 with elevated WBC 21.9 as well as hyperkalemia with K- 5.5. She was febrile with T-max 101 was noted to be lethargic with complaints of diffuse pain affecting bilateral knees, feet, elbows as well as neck and feet. Dr. Clementeen Graham was consulted for input and recommended transfer to stepdown due to concerns of sepsis as well as GIB. EEG was negative for seizure activity.   She was transfused with 2 units PRBC and started on IV antibiotics . Work up without evidence of infection and most consistent with connective disease flare. X rays of bilateral knees, ankles, hands and wrists negative for inflammatory/infectious changes. She was started on high dose steroids per input from ortho. Renal was consulted for input and recommended IV bicarb for metabolic acidosis and no indication to treat mild hyperkalemia. 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 Blood work done revealing ANA +, elevated RA factor, normal complement and elevated levels mycophenolic acid. Rheumatology consult recommended for work up. Renal ultrasound showed evidence of medical renal disease and no obstructive uropathy. Dr. Amedeo Plenty was consulted for input and recommended conservative management as stools were heme negative and anemia likely due to autoimmune component. H/H has been stable and GI has signed off. Patient has has labile blood pressures as well as increase of > 4 kg in weight therefore lasix was resumed and titrated to bid yesterday per renal input. Dr. Jimmy Footman suspects Cr to  rise with better control and can push labatelol or coreg. On plaquenil with recommendations for Opthal evaluation for baseline past discharge. Recommendations for prednisone taper in a week? As well as follow up with Rheum past discharge.   Arthralgias have improved with resolution of lethargy. Patient has been afebrile with reactive leucocytosis resolving. Blood sugars trending high on current dose steriods. Baseline Cr 3.2-3.5. Therapies ongoing and working on pregait activity. She is showing improvement in tolerance of activity and was admitted back to complete CIR program.  Patient transferred to CIR on 10/17/2014 .    Patient currently requires min with basic self-care skills secondary to muscle weakness, decreased cardiorespiratoy endurance, ataxia, decreased coordination and decreased motor planning and decreased standing balance, hemiplegia and decreased balance strategies.  Prior to hospitalization, patient could complete ADLs and IADLs with independent .  Patient will benefit from skilled intervention to increase independence with basic self-care skills prior to discharge home with care partner.  Anticipate patient will require intermittent supervision and possible outpatient OT services.  OT - End of Session Activity Tolerance: Decreased this session Endurance Deficit: Yes Endurance Deficit Description: fatigues during B & D session OT Assessment Barriers to Discharge:  (none known at this time) OT Patient  demonstrates impairments in the following area(s): Balance;Sensory;Endurance;Motor;Safety OT Basic ADL's Functional Problem(s): Grooming;Bathing;Dressing;Toileting OT Advanced ADL's Functional Problem(s): Simple Meal Preparation;Laundry OT Transfers Functional Problem(s): Toilet;Tub/Shower OT Additional Impairment(s): None OT Plan OT Intensity: Minimum of 1-2 x/day, 45 to 90 minutes OT Frequency: 5 out of 7 days OT Duration/Estimated Length of Stay: 7 days OT  Treatment/Interventions: Balance/vestibular training;Discharge planning;Self Care/advanced ADL retraining;Therapeutic Activities;UE/LE Coordination activities;Cognitive remediation/compensation;Functional mobility training;Patient/family education;Therapeutic Exercise;Visual/perceptual remediation/compensation;Community reintegration;DME/adaptive equipment instruction;Neuromuscular re-education;Psychosocial support;UE/LE Strength taining/ROM OT Self Feeding Anticipated Outcome(s): n/a OT Basic Self-Care Anticipated Outcome(s): Mod I OT Toileting Anticipated Outcome(s): Mod I OT Bathroom Transfers Anticipated Outcome(s): toilet - Mod I  shower - S OT Recommendation Recommendations for Other Services: Neuropsych consult Patient destination: Home Follow Up Recommendations: Home health OT;Outpatient OT;24 hour supervision/assistance Equipment Recommended: To be determined   Skilled Therapeutic Intervention Upon entering room, pt supine in bed sleeping with no c/o pain this session. OT educated pt on OT purpose, POC, and goals. Pt performed supine > sit with supervision and verbal cues for technique. Bathing and dressing performed while seated on EOB with SBA for dynamic seated balance. Pt required Min A for balance when standing to wash peri area and buttocks and for LB clothing management. Pt ambulated to bathroom hand held with Min A for balance. Pt seated in recliner chair once dressed with feet up and call bell within reach awaiting breakfast tray.   OT Evaluation Precautions/Restrictions  Fall risk Pain  No c/o pain Home Living/Prior Functioning Home Living Family/patient expects to be discharged to:: Private residence Living Arrangements: Children Available Help at Discharge: Available 24 hours/day, Family Type of Home: Apartment Home Access: Stairs to enter Technical brewer of Steps: 4 (sidewalk to get to stairs) Entrance Stairs-Rails: Right Home Layout: Two level, Able to live  on main level with bedroom/bathroom Alternate Level Stairs-Number of Steps: flight  Lives With: Son, Daughter (two sons) IADL History Homemaking Responsibilities: Yes Meal Prep Responsibility: Primary Laundry Responsibility: Primary Cleaning Responsibility: Primary Homemaking Comments: Pt reports children are ages 20,24,and 29 who work different shifts and are available.  Current License: Yes Mode of TransportationNurse, mental health: 2 years of college Occupation: Full time employment Prior Function Level of Independence: Independent with basic ADLs, Independent with homemaking with ambulation, Independent with gait, Independent with transfers  Able to Take Stairs?: Yes Driving: Yes Vocation: Full time employment Vocation Requirements: Development worker, international aid at [employer] Leisure: Hobbies-yes (Comment) Comments: play candy crush, out to eat, family activities Vision/Perception Wears glasses at times with pt reported no change since baseline. Cognition Overall Cognitive Status: Impaired/Different from baseline Arousal/Alertness: Awake/alert Orientation Level: Oriented X4 Attention: Selective Sustained Attention: Appears intact Selective Attention: Impaired Selective Attention Impairment: Functional basic Memory: Impaired Memory Impairment: Decreased recall of new information;Retrieval deficit;Decreased short term memory Decreased Short Term Memory: Verbal basic Awareness: Impaired Awareness Impairment: Emergent impairment Problem Solving: Impaired Problem Solving Impairment: Functional complex Executive Function: Reasoning;Self Monitoring;Self Correcting Reasoning: Impaired Reasoning Impairment: Functional complex Self Monitoring: Impaired Self Monitoring Impairment: Functional complex Self Correcting: Impaired Self Correcting Impairment: Functional complex Safety/Judgment: Appears intact Sensation Sensation Light Touch: Impaired Detail Light Touch Impaired Details: Impaired  LLE;Impaired LUE Stereognosis: Not tested Hot/Cold: Impaired by gross assessment Hot/Cold Impaired Details: Impaired LLE;Impaired LUE Proprioception: Appears Intact Coordination Gross Motor Movements are Fluid and Coordinated: No Fine Motor Movements are Fluid and Coordinated: No Coordination and Movement Description: Pt with decreased L FMC but pt was able to tie shoes with increased time but unable to hook bra  Motor  Motor Motor: Hemiplegia;Abnormal postural alignment and control Motor - Skilled Clinical Observations: Pt with L hemi paresis, decreased balance and decreased coordination in LLE.  Mobility  Bed Mobility Bed Mobility: Supine to Sit;Sit to Supine Supine to Sit: 5: Supervision Supine to Sit Details: Verbal cues for precautions/safety Sit to Supine: 5: Supervision Sit to Supine - Details: Verbal cues for precautions/safety Transfers Sit to Stand: 4: Min assist Sit to Stand Details: Verbal cues for technique;Verbal cues for precautions/safety  Trunk/Postural Assessment  Cervical Assessment Cervical Assessment: Within Functional Limits Thoracic Assessment Thoracic Assessment: Within Functional Limits Lumbar Assessment Lumbar Assessment: Within Functional Limits Postural Control Postural Control: Within Functional Limits Protective Responses: delayed, especially to the L  Balance Balance Balance Assessed: Yes Dynamic Standing Balance Dynamic Standing - Balance Support: During functional activity;Bilateral upper extremity supported Dynamic Standing - Level of Assistance: 4: Min assist;5: Stand by assistance Dynamic Standing - Balance Activities: Other (comment) Dynamic Standing - Comments: clothing management and bathing Extremity/Trunk Assessment RUE Assessment RUE Assessment: Within Functional Limits LUE Assessment LUE Assessment: Exceptions to WFL LUE AROM (degrees) Overall AROM Left Upper Extremity: Deficits;Due to pain;Other (comment) (shoulder flexion  limited by pain but still WFLs)  FIM:  FIM - Grooming Grooming Steps: Wash, rinse, dry face;Wash, rinse, dry hands;Oral care, brush teeth, clean dentures;Brush, comb hair Grooming: 4: Steadying assist  or patient completes 3 of 4 or 4 of 5 steps FIM - Bathing Bathing Steps Patient Completed: Chest;Right Arm;Left Arm;Abdomen;Front perineal area;Buttocks;Right upper leg;Left upper leg Bathing: 4: Min-Patient completes 8-9 [id number] parts or 75+ percent FIM - Upper Body Dressing/Undressing Upper body dressing/undressing steps patient completed: Thread/unthread left bra strap;Thread/unthread right bra strap;Thread/unthread right sleeve of pullover shirt/dresss;Thread/unthread left sleeve of pullover shirt/dress;Put head through opening of pull over shirt/dress;Pull shirt over trunk Upper body dressing/undressing: 4: Min-Patient completed 75 plus % of tasks FIM - Lower Body Dressing/Undressing Lower body dressing/undressing steps patient completed: Thread/unthread right underwear leg;Thread/unthread left underwear leg;Pull underwear up/down;Thread/unthread right pants leg;Thread/unthread left pants leg;Pull pants up/down;Don/Doff right sock;Don/Doff left sock;Don/Doff right shoe;Don/Doff left shoe;Fasten/unfasten right shoe;Fasten/unfasten left shoe Lower body dressing/undressing: 4: Steadying Assist FIM - BControl and instrumentation engineerDevices: Arm rests Bed/Chair Transfer: 5: Supine > Sit: Supervision (verbal cues/safety issues);5: Sit > Supine: Supervision (verbal cues/safety issues);4: Bed > Chair or W/C: Min A (steadying Pt. > 75%);4: Chair or W/C > Bed: Min A (steadying Pt. > 75%) FIM - Tub/Shower Transfers Tub/shower Transfers: 0-Activity did not occur or was simulated   Refer to Care Plan for Long Term Goals  Recommendations for other services: Neuropsych  Discharge Criteria: Patient will be discharged from OT if patient refuses treatment 3 consecutive times without medical  reason, if treatment goals not met, if there is a change in medical status, if patient makes no progress towards goals or if patient is discharged from hospital.  The above assessment, treatment plan, treatment alternatives and goals were discussed and mutually agreed upon: by patient  PPhineas Semen12/16/2015, 8:32 PM

## 2014-10-18 NOTE — Evaluation (Signed)
Speech Language Pathology Assessment and Plan  Patient Details  Name: [name] [name] MRN: [id number] Date of Birth: Sep 12, 1961  SLP Diagnosis: Cognitive Impairments  Rehab Potential: Good ELOS: 12/23    Today's Date: 10/18/2014 SLP Individual Time: 6644-0347 SLP Individual Time Calculation (min): 50 min   Problem List:  Patient Active Problem List   Diagnosis Date Noted  . Acute blood loss anemia   . Acute on chronic renal failure   . Hyponatremia   . Hyperglycemia   . MCTD (mixed connective tissue disease)   . SLE (systemic lupus erythematosus)   . Secondary cardiomyopathy   . Noncompliance with medication regimen   . Tobacco abuse   . Sepsis 10/10/2014  . Leucocytosis 10/10/2014  . Fluid overload 10/10/2014  . Chronic kidney disease 10/10/2014  . SIRS (systemic inflammatory response syndrome) 10/10/2014  . Acute renal failure superimposed on stage 4 chronic kidney disease 10/10/2014  . Anemia 10/10/2014  . Hyperkalemia 10/10/2014  . Acute encephalopathy 10/10/2014  . Membranous glomerulonephritis   . Torticollis, acquired 10/06/2014  . Essential hypertension 10/05/2014  . Acute respiratory acidosis   . ICH (intracerebral hemorrhage)   . Chronic ankle pain   . Cerebral hemorrhage   . Malignant hypertension   . Stroke 09/28/2014  . 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 Hypertensive emergency 09/28/2014  . Iron deficiency anemia 02/18/2008  . 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 ADRENAL INSUFFICIENCY, HX OF 02/18/2008  . DIABETES MELLITUS, TYPE II 10/25/2007  . HYPERLIPIDEMIA 10/25/2007  . UNSPECIFIED ULCERATION OF VULVA 10/25/2007  . Systemic lupus erythematosus 10/25/2007  . Rheumatoid arthritis 10/25/2007  . HYPERTENSION NEC 10/25/2007  . COLONIC POLYPS, HYPERPLASTIC 04/08/2006  . HEMORRHOIDS 04/08/2006   Past Medical History:  Past Medical History  Diagnosis Date  . Hypertension   . Lupus   . CKD (chronic kidney disease)    due to lupus/Dr. Hyman Hopes  . Diabetes mellitus   . Stenosis of cervical spine region     with HNP at C5/6, C6/7  . Metatarsal bone fracture right    4th  . 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 Arthritis     RA  . Anemia   . Stroke    Past Surgical History:  Past Surgical History  Procedure Laterality Date  . Breast biopsy      Assessment / Plan / Recommendation Clinical Impression [name] [name] is a 53 year old female with history of HTN, hyperlipidemia, lupus with mild LLE weakness as well as compliance issues who was admitted on 09/28/14 with acute onset left sided weakness and numbness as well as gaze to the right side. Patient reported being out of BP meds for 3 weeks and BP 238/88 at admission. CT of head done revealing acute hemorrhagic infarct in pons and chronic subcortical white matter disease in right frontoparietal area.  Follow up MRI/MRA brain done on 12/03 revealing changes c/w acute/subacute hemorrhage in right parmedian pons and no underlying mass evident. Patient with resultant left sided weakness impacting mobility as well as safety. CIR was recommended by rehab team and patient was admitted for inpatient rehab program on 10/05/14. Patient was attempting to participate in therapies; WBC 21.9 as well as hyperkalemia with K- 5.5. She was febrile with T-max 101 was noted to be lethargic with complaints of diffuse pain affecting bilateral knees, feet, elbows as well as neck and feet. Dr. Gonzella Lex was consulted  for input and recommended transfer to stepdown due to concerns of sepsis as well as GIB.  Work up without evidence of infection and most consistent with connective disease flare. X rays of bilateral knees, ankles, hands and wrists negative for inflammatory/infectious changes. She was started on high dose steroids per input from ortho. Dr. Madilyn Fireman was consulted for input and recommended conservative management of anemia, which was  likely due to autoimmune component. Improvements have been noted with resolution of lethargy. Patient has been afebrile with reactive leucocytosis resolving.  Blood sugars trending high on current dose steriods. Therapies ongoing; she is showing improvement in tolerance of activity and was admitted back to complete CIR program 10/17/14.  Orders received and cognitive-linguistic testing revealed increased abilities with Min-Mod assist to complete complex problem solving tasks.  Patient would continue to benefit from skilled SLP services to maximize functional abilities prior to discharge home with intermittent supervision for complex problem solving.    Skilled Therapeutic Interventions          Cognitive-linguistic evaluation completed with results and recommendations reviewed with patient.  SLP initiated education regarding use of compensatory strategies for recall as well as self-monitoring and correcting of problem solving errors.      SLP Assessment  Patient will need skilled Speech Lanaguage Pathology Services during CIR admission    Recommendations  Oral Care Recommendations: Oral care BID Patient destination: Home Follow up Recommendations: Outpatient SLP;Other (comment) (Intermittent Supervision ) Equipment Recommended: None recommended by SLP    SLP Frequency 5 out of 7 days   SLP Treatment/Interventions Cognitive remediation/compensation;Cueing hierarchy;Environmental controls;Functional tasks;Internal/external aids;Patient/family education;Medication managment;Therapeutic Activities    Pain Pain Assessment Pain Assessment: No/denies pain Prior Functioning    Short Term Goals: Week 1: SLP Short Term Goal 1 (Week 1): STGs=LTGs due to length of stay  See FIM for current functional status Refer to Care Plan for Long Term Goals  Recommendations for other services: None  Discharge Criteria: Patient will be discharged from SLP if patient refuses treatment 3 consecutive times without  medical reason, if treatment goals not met, if there is a change in medical status, if patient makes no progress towards goals or if patient is discharged from hospital.  The above assessment, treatment plan, treatment alternatives and goals were discussed and mutually agreed upon: by patient  Fae Pippin, M.A., [name] [phone]  Ginny Loomer 10/18/2014, 4:27 PM

## 2014-10-18 NOTE — Progress Notes (Signed)
Patient information reviewed and entered into [facility] system by Trenisha Lafavor, RN, CRRN, PPS Coordinator.  Information including medical coding and functional independence measure will be reviewed and updated through discharge.    

## 2014-10-18 NOTE — Evaluation (Signed)
Physical Therapy Assessment and Plan  Patient Details  Name: [name] [name] MRN: [id number] Date of Birth: Mar 16, 1961  PT Diagnosis: Ataxia, Coordination disorder, Difficulty walking, Hemiparesis non-dominant and Impaired sensation Rehab Potential: Good ELOS: 7 days    Today's Date: 10/18/2014 PT Individual Time: 0830-0927 PT Individual Time Calculation (min): 57 min    Problem List:  Patient Active Problem List   Diagnosis Date Noted  . Acute blood loss anemia   . Acute on chronic renal failure   . Hyponatremia   . Hyperglycemia   . MCTD (mixed connective tissue disease)   . SLE (systemic lupus erythematosus)   . Secondary cardiomyopathy   . Noncompliance with medication regimen   . Tobacco abuse   . Sepsis 10/10/2014  . Leucocytosis 10/10/2014  . Fluid overload 10/10/2014  . Chronic kidney disease 10/10/2014  . SIRS (systemic inflammatory response syndrome) 10/10/2014  . Acute renal failure superimposed on stage 4 chronic kidney disease 10/10/2014  . Anemia 10/10/2014  . Hyperkalemia 10/10/2014  . Acute encephalopathy 10/10/2014  . Membranous glomerulonephritis   . Torticollis, acquired 10/06/2014  . Essential hypertension 10/05/2014  . Acute respiratory acidosis   . ICH (intracerebral hemorrhage)   . Chronic ankle pain   . Cerebral hemorrhage   . Malignant hypertension   . Stroke 09/28/2014  . 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 Hypertensive emergency 09/28/2014  . Iron deficiency anemia 02/18/2008  . 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 ADRENAL INSUFFICIENCY, HX OF 02/18/2008  . DIABETES MELLITUS, TYPE II 10/25/2007  . HYPERLIPIDEMIA 10/25/2007  . UNSPECIFIED ULCERATION OF VULVA 10/25/2007  . Systemic lupus erythematosus 10/25/2007  . Rheumatoid arthritis 10/25/2007  . HYPERTENSION NEC 10/25/2007  . COLONIC POLYPS, HYPERPLASTIC 04/08/2006  . HEMORRHOIDS 04/08/2006    Past Medical History:  Past Medical History  Diagnosis Date   . Hypertension   . Lupus   . CKD (chronic kidney disease)     due to lupus/Dr. Hyman Hopes  . Diabetes mellitus   . Stenosis of cervical spine region     with HNP at C5/6, C6/7  . Metatarsal bone fracture right    4th  . 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 Arthritis     RA  . Anemia   . Stroke    Past Surgical History:  Past Surgical History  Procedure Laterality Date  . Breast biopsy      Assessment & Plan Clinical Impression: Patient is a 53 y.o. year old female with history of HTN, hyperlipidemia, lupus with mild LLE weakness as well as compliance issues who was admitted on 09/28/14 with acute onset left sided weakness and numbness as well as gaze to the right side. Patient reported being out of BP meds for 3 weeks and BP 238/88 at admission. UDS negative. 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 Carotid dopplers significant for R-40-59% ICA stenosis. ST evaluation with high level cognitive deficits affecting STM as well as solving/reasoning especially in math area. Blood pressure remains labile requiring Cardene drip, neck/back pain as well as issues with lethargy. Patient with resultant left sided weakness impacting mobility as well as safety. Therapy ongoing and CIR recommended by rehab team and MD. Patient transferred to CIR on 10/17/2014 .   Patient currently requires min with mobility secondary to muscle weakness, decreased  cardiorespiratoy endurance, impaired timing and sequencing, ataxia and decreased coordination and decreased attention and decreased memory.  Prior to hospitalization, patient was independent  with mobility and lived with Son, Daughter (two sons) in a Apartment home.  Home access is 4 (sidewalk to get to stairs)Stairs to enter.  Patient will benefit from skilled PT intervention to  maximize safe functional mobility, minimize fall risk and decrease caregiver burden for planned discharge home with intermittent S.  Anticipate patient will benefit from follow up OP at discharge.  PT - End of Session Activity Tolerance: Decreased this session;Tolerates 30+ min activity with multiple rests Endurance Deficit: Yes Endurance Deficit Description: fatigues following multiple bouts of gait.  PT Assessment Rehab Potential (ACUTE/IP ONLY): Good PT Patient demonstrates impairments in the following area(s): Balance;Endurance;Motor;Safety PT Transfers Functional Problem(s): Bed Mobility;Bed to Chair;Car;Furniture;Floor PT Locomotion Functional Problem(s): Ambulation;Wheelchair Mobility;Stairs PT Plan PT Intensity: Minimum of 1-2 x/day ,45 to 90 minutes PT Frequency: 5 out of 7 days PT Duration Estimated Length of Stay: 7 days  PT Treatment/Interventions: Ambulation/gait training;Balance/vestibular training;Cognitive remediation/compensation;Community reintegration;Discharge planning;Disease management/prevention;DME/adaptive equipment instruction;Functional mobility training;Neuromuscular re-education;Patient/family education;UE/LE Strength taining/ROM;UE/LE Coordination activities;Wheelchair propulsion/positioning PT Transfers Anticipated Outcome(s): mod I  PT Locomotion Anticipated Outcome(s): mod I  PT Recommendation Follow Up Recommendations: Home health PT;Outpatient PT Patient destination: Home Equipment Recommended: To be determined Equipment Details: anticipate patient will need RW  Skilled Therapeutic Intervention Pt assessment and evaluation completed, see full details below.  Initiated gait training with RW and pt able to ambulate at close S level, however note difficulty with placement and coordination with LLE and tends to hyperextend L knee with L stance phase of gait.  Also performed BERG with score of 29/56, placing pt at 100% risk of falling, however feel that this  will greatly improve with PT and with use of RW.  Educated pt on rehab schedule, expected outcomes, equipment, ELOS.  Pt verbalized understanding.   Pt left with SLP for next session.   PT Evaluation Precautions/Restrictions Precautions Precautions: Fall Precaution Comments: swollen joints, increased pain ( feet and ankle) Restrictions Weight Bearing Restrictions: No General Chart Reviewed: Yes Family/Caregiver Present: No Vital SignsOxygen Therapy O2 Device: Not Delivered Pain Pain Assessment Pain Assessment: No/denies pain Pain Score: 0-No pain Faces Pain Scale: No hurt Home Living/Prior Functioning Home Living Family/patient expects to be discharged to:: Private residence Living Arrangements: Children Available Help at Discharge: Available 24 hours/day;Family Type of Home: Apartment Home Access: Stairs to enter [employer] of Steps: 4 (sidewalk to get to stairs) Entrance Stairs-Rails: Right Home Layout: Two level;Able to live on main level with bedroom/bathroom Alternate Level Stairs-Number of Steps: flight  Lives With: Son;Daughter (two sons) Prior Function Level of Independence: Independent with basic ADLs;Independent with homemaking with ambulation;Independent with gait;Independent with transfers  Able to Take Stairs?: Yes Driving: Yes Vocation: Full time employment Leisure: Hobbies-yes (Comment) Comments: play candy crush, out to eat, family activities Vision/Perception   See OT note Cognition Overall Cognitive Status: Impaired/Different from baseline Arousal/Alertness: Awake/alert Orientation Level: Oriented X4 Attention: Selective Sustained Attention: Appears intact Selective Attention: Impaired Selective Attention Impairment: Functional basic Memory: Impaired Memory Impairment: Decreased recall of new information Awareness: Impaired Awareness Impairment: Emergent impairment Safety/Judgment: Appears intact Comments: Pt with good understanding of  her balance and strength deficits during PT eval.  Sensation Sensation Light Touch: Impaired Detail Light Touch Impaired Details: Impaired LLE;Impaired LUE Stereognosis: Not tested Hot/Cold: Not tested Proprioception: Appears Intact Coordination Gross Motor Movements are Fluid and Coordinated: No Fine Motor Movements are  Fluid and Coordinated: No Coordination and Movement Description: Pt with decreased [place] in LUE, and note decreased fine motor and gross motor movements in LLE during gait and stair training.  Heel Shin Test: over shooting noted with LLE Motor  Motor Motor: Hemiplegia;Abnormal postural alignment and control Motor - Skilled Clinical Observations: Pt with L hemi paresis, decreased balance and decreased coordination in LLE.   Mobility Bed Mobility Bed Mobility: Supine to Sit;Sit to Supine Supine to Sit: 5: Supervision Supine to Sit Details: Verbal cues for precautions/safety Sit to Supine: 5: Supervision Sit to Supine - Details: Verbal cues for precautions/safety Transfers Transfers: Yes Sit to Stand: 4: Min assist Sit to Stand Details: Verbal cues for technique;Verbal cues for precautions/safety Stand to Sit: 4: Min assist Stand to Sit Details (indicate cue type and reason): Verbal cues for sequencing;Verbal cues for technique;Verbal cues for precautions/safety Stand Pivot Transfers: 4: Min assist Stand Pivot Transfer Details: Verbal cues for technique;Verbal cues for precautions/safety Locomotion  Ambulation Ambulation: Yes Ambulation/Gait Assistance: 4: Min assist Ambulation Distance (Feet): 100 Feet (another 100' with RW) Assistive device: None;Rolling walker Ambulation/Gait Assistance Details: Verbal cues for sequencing;Verbal cues for technique;Verbal cues for precautions/safety;Verbal cues for gait pattern;Verbal cues for safe use of DME/AE;Manual facilitation for weight shifting Ambulation/Gait Assistance Details: min facilitation to decrease L knee  hyperextension during L stance phase of gait.  Gait Gait: Yes Gait Pattern: Impaired Gait Pattern: Step-through pattern;Decreased stride length;Decreased weight shift to left;Ataxic;Narrow base of support Stairs / Additional Locomotion Stairs: Yes Stairs Assistance: 4: Min assist Stairs Assistance Details: Verbal cues for sequencing;Verbal cues for technique;Verbal cues for precautions/safety;Verbal cues for gait pattern Stair Management Technique: One rail Right;Step to pattern;Forwards Number of Stairs: 5 Height of Stairs: 4 Architect: Yes Wheelchair Assistance: 5: Investment banker, operational Details: Verbal cues for sequencing;Verbal cues for technique;Verbal cues for Information systems manager: Both upper extremities;Both lower extermities Wheelchair Parts Management: Needs assistance Distance: 150  Trunk/Postural Assessment  Cervical Assessment Cervical Assessment: Within Functional Limits Thoracic Assessment Thoracic Assessment: Within Functional Limits Lumbar Assessment Lumbar Assessment: Within Functional Limits Postural Control Postural Control: Within Functional Limits Protective Responses: delayed, especially to the L  Balance Balance Balance Assessed: Yes Standardized Balance Assessment Standardized Balance Assessment: Berg Balance Test Berg Balance Test Sit to Stand: Able to stand  independently using hands Standing Unsupported: Able to stand 2 minutes with supervision Sitting with Back Unsupported but Feet Supported on Floor or Stool: Able to sit safely and securely 2 minutes Stand to Sit: Sits independently, has uncontrolled descent Transfers: Able to transfer with verbal cueing and /or supervision Standing Unsupported with Eyes Closed: Able to stand 10 seconds with supervision Standing Ubsupported with Feet Together: Able to place feet together independently and stand for 1 minute with supervision From  Standing, Reach Forward with Outstretched Arm: Can reach forward >12 cm safely (5") From Standing Position, Pick up Object from Floor: Able to pick up shoe, needs supervision From Standing Position, Turn to Look Behind Over each Shoulder: Needs assist to keep from losing balance and falling Turn 360 Degrees: Needs assistance while turning Standing Unsupported, Alternately Place Feet on Step/Stool: Needs assistance to keep from falling or unable to try Standing Unsupported, One Foot in Front: Able to plae foot ahead of the other independently and hold 30 seconds Standing on One Leg: Tries to lift leg/unable to hold 3 seconds but remains standing independently Total Score: 29 Dynamic Sitting Balance Dynamic Sitting - Balance Support: Feet supported;During functional activity Dynamic  Sitting - Level of Assistance: 5: Stand by assistance Static Standing Balance Static Standing - Balance Support: Right upper extremity supported;During functional activity Static Standing - Level of Assistance: 5: Stand by assistance Dynamic Standing Balance Dynamic Standing - Balance Support: During functional activity;Bilateral upper extremity supported Dynamic Standing - Level of Assistance: 4: Min assist;5: Stand by assistance Extremity Assessment      RLE Assessment RLE Assessment: Within Functional Limits LLE Assessment LLE Assessment: Exceptions to [facility] LLE Strength LLE Overall Strength: Deficits LLE Overall Strength Comments: grossly 4/5 throughout, continue to note decreased ankle PF/DF, but seems improved from previous CIR stay.   FIM:  FIM - Control and instrumentation engineer Devices: Arm rests Bed/Chair Transfer: 5: Supine > Sit: Supervision (verbal cues/safety issues);5: Sit > Supine: Supervision (verbal cues/safety issues);4: Bed > Chair or W/C: Min A (steadying Pt. > 75%);4: Chair or W/C > Bed: Min A (steadying Pt. > 75%) FIM - Locomotion: Wheelchair Distance: 150 Locomotion:  Wheelchair: 5: Travels 150 ft or more: maneuvers on rugs and over door sills with supervision, cueing or coaxing FIM - Locomotion: Ambulation Locomotion: Ambulation Assistive Devices: Other (comment) (no AD) Ambulation/Gait Assistance: 4: Min assist Locomotion: Ambulation: 2: Travels 50 - 149 ft with minimal assistance (Pt.>75%) FIM - Locomotion: Stairs Locomotion: Scientist, physiological: Hand rail - 1 Locomotion: Stairs: 2: Up and Down 4 - 11 stairs with minimal assistance (Pt.>75%)   Refer to Care Plan for Long Term Goals  Recommendations for other services: None  Discharge Criteria: Patient will be discharged from PT if patient refuses treatment 3 consecutive times without medical reason, if treatment goals not met, if there is a change in medical status, if patient makes no progress towards goals or if patient is discharged from hospital.  The above assessment, treatment plan, treatment alternatives and goals were discussed and mutually agreed upon: by patient  Denice Bors 10/18/2014, 11:25 AM

## 2014-10-18 NOTE — Progress Notes (Signed)
53 y.o. female with history of HTN, hyperlipidemia, lupus with mild LLE weakness as well as compliance issues who was admitted on 09/28/14 with acute onset left sided weakness and numbness as well as gaze to the right side. Patient reported being out of BP meds for 3 weeks and BP 238/88 at admission. UDS negative.  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  Carotid dopplers significant for R-40-59% ICA stenosis.  She was treated with IV diuresis for SOB due to fluid overload.  Lisinopril was dicontinued due to worsening of renal status and she was treated with IV bicarb for acidosis. Repeat cardiac echo showed EF 60-70% with AV sclerosis without stenosis.  FEES done yesterday showing mild dysphagia with pentration due to large boluses but no aspiration therefore she was started on regular textures with thin liquids. She continued to have complaints of neck, back and ankle pain as well as issues with fluctuating bouts of lethargy.  Follow up MRI/MRA brain done on 12/03 revealing changes c/w acute/subacute hemorrhage in right parmedian pons and no underlying mass evident. Minimal diffusion abnormality in high frontal lobes bilaterally likely due to calcification and probably meningioma.    Subjective/Complaints:   Objective: Vital Signs: Blood pressure 184/73, pulse 86, temperature 98.6 F (37 C), temperature source Oral, resp. rate 18, weight 70.988 kg (156 lb 8 oz), last menstrual period 07/18/2012, SpO2 94 %. No results found. Results for orders placed or performed during the hospital encounter of 10/17/14 (from the past 72 hour(s))  Glucose, capillary     Status: Abnormal   Collection Time: 10/17/14  5:23 PM  Result Value Ref Range   Glucose-Capillary 323 (H) 70 - 99 mg/dL  Glucose, capillary     Status: Abnormal   Collection Time: 10/17/14  9:03 PM  Result Value Ref Range    Glucose-Capillary 264 (H) 70 - 99 mg/dL  Glucose, capillary     Status: Abnormal   Collection Time: 10/17/14 11:07 PM  Result Value Ref Range   Glucose-Capillary 210 (H) 70 - 99 mg/dL  CBC WITH DIFFERENTIAL     Status: Abnormal   Collection Time: 10/18/14  4:32 AM  Result Value Ref Range   WBC 12.8 (H) 4.0 - 10.5 K/uL   RBC 3.16 (L) 3.87 - 5.11 MIL/uL   Hemoglobin 8.4 (L) 12.0 - 15.0 g/dL   HCT 26.2 (L) 36.0 - 46.0 %   MCV 82.9 78.0 - 100.0 fL   MCH 26.6 26.0 - 34.0 pg   MCHC 32.1 30.0 - 36.0 g/dL   RDW 13.8 11.5 - 15.5 %   Platelets 263 150 - 400 K/uL   Neutrophils Relative % 87 (H) 43 - 77 %   Neutro Abs 11.1 (H) 1.7 - 7.7 K/uL   Lymphocytes Relative 9 (L) 12 - 46 %   Lymphs Abs 1.2 0.7 - 4.0 K/uL   Monocytes Relative 4 3 - 12 %   Monocytes Absolute 0.5 0.1 - 1.0 K/uL   Eosinophils Relative 0 0 - 5 %   Eosinophils Absolute 0.0 0.0 - 0.7 K/uL   Basophils Relative 0 0 - 1 %   Basophils Absolute 0.0 0.0 - 0.1 K/uL  Comprehensive metabolic panel     Status: Abnormal   Collection Time: 10/18/14  4:32 AM  Result Value Ref Range   Sodium 138 137 - 147 mEq/L   Potassium 3.8 3.7 -  5.3 mEq/L   Chloride 98 96 - 112 mEq/L   CO2 25 19 - 32 mEq/L   Glucose, Bld 172 (H) 70 - 99 mg/dL   BUN 94 (H) 6 - 23 mg/dL   Creatinine, Ser 3.21 (H) 0.50 - 1.10 mg/dL   Calcium 9.3 8.4 - 10.5 mg/dL   Total Protein 6.2 6.0 - 8.3 g/dL   Albumin 2.1 (L) 3.5 - 5.2 g/dL   AST 17 0 - 37 U/L   ALT 24 0 - 35 U/L   Alkaline Phosphatase 97 39 - 117 U/L   Total Bilirubin 0.3 0.3 - 1.2 mg/dL   GFR calc non Af Amer 16 (L) >90 mL/min   GFR calc Af Amer 18 (L) >90 mL/min    Comment: (NOTE) The eGFR has been calculated using the CKD EPI equation. This calculation has not been validated in all clinical situations. eGFR's persistently <90 mL/min signify possible Chronic Kidney Disease.    Anion gap 15 5 - 15  Glucose, capillary     Status: Abnormal   Collection Time: 10/18/14  7:03 AM  Result Value Ref  Range   Glucose-Capillary 158 (H) 70 - 99 mg/dL     HEENT: normal Cardio: RRR and no murmur Resp: CTA B/L and unlabored GI: BS positive and NT, ND Extremity:  Pulses positive and No Edema Skin:   Intact Neuro: Alert/Oriented, Normal Sensory, Abnormal Motor 5 minus/5 in the left deltoid, biceps, triceps, grip, hip flexor, knee extensor, ankle dorsal flexor plantar flexor and Abnormal FMC Ataxic/ dec FMC Musc/Skel:  Other no joint tenderness in the knees hands and feet, full active range of motion without pain Gen. no acute distress   Assessment/Plan: 1. 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 Physiatrist is providing close team supervision and 24 hour management of active medical problems listed below. Physiatrist and rehab team continue to assess barriers to discharge/monitor patient progress toward functional and medical goals. FIM:                   Comprehension Comprehension Mode: Auditory Comprehension: 5-Follows basic conversation/direction: With no assist  Expression Expression Mode: Verbal Expression: 5-Expresses basic needs/ideas: With no assist  Social Interaction Social Interaction: 6-Interacts appropriately with others with medication or extra time (anti-anxiety, antidepressant).  Problem Solving Problem Solving: 5-Solves basic problems: With no assist  Memory Memory: 6-More than reasonable amt of time  Medical Problem List and Plan: 1. 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   2.  DVT Prophylaxis/Anticoagulation: Mechanical:  Antiembolism stockings, knee (TED hose) Bilateral lower extremities Sequential compression  devices, below knee Bilateral lower extremities 3. Pain Management: flexeril prn for spasms. rx of connective tissue disorder/rx with prednsione 4. Mood: team to provide ego support as needed. Appears to be in good spirits at present 5. Neuropsych: This patient is capable of making decisions on her own behalf. 6. Skin/Wound Care: ensure appropriate nutrition and skin pressure relief measures 7. Fluids/Electrolytes/Nutrition: encourage PO intake. labwork on admit 8. Acute on chronic renal failure---baseline Cr around 3.3.             -cellcept resumed, prednisone for now 9. Mixed Connective Tissue Disorder/RA-             -  pain much better controlled after steroid burst             -outpt rheum follow up             -plaquenil             -prednisone taper to begin next week 10. Likely hemolytic anemia             -continue to monitor blood counts             -immuno-supressive therapy as above 11. Steroid induced hyperglycemia:             -SSI 12. Malignant hypertension:  mainly systolic elevation at this point             -lasix, catapres, labetalol, imdur             -further adjustments  LOS (Days) 1 A FACE TO FACE EVALUATION WAS PERFORMED  KIRSTEINS,ANDREW E 10/18/2014, 8:57 AM

## 2014-10-18 NOTE — Patient Care Conference (Signed)
Inpatient RehabilitationTeam Conference and Plan of Care Update Date: 10/18/2014   Time: 11:53 AM    Patient Name: [name] [name]      Medical Record Number: [id number]  Date of Birth: [birth year] Sex: Female         Room/Bed: 4W04C/4W04C-01 Payor Info: Payor: [employer] / Plan: CIGNA MANAGED / Product Type: *No Product type* /    Admitting Diagnosis: R Pontine hemorrhage  Admit Date/Time:  10/17/2014  5:07 PM Admission Comments: No comment available   Primary Diagnosis:  MCTD (mixed connective tissue disease) Principal Problem: MCTD (mixed connective tissue disease)  Patient Active Problem List   Diagnosis Date Noted  . Acute blood loss anemia   . Acute on chronic renal failure   . Hyponatremia   . Hyperglycemia   . MCTD (mixed connective tissue disease)   . SLE (systemic lupus erythematosus)   . Secondary cardiomyopathy   . Noncompliance with medication regimen   . Tobacco abuse   . Sepsis 10/10/2014  . Leucocytosis 10/10/2014  . Fluid overload 10/10/2014  . Chronic kidney disease 10/10/2014  . SIRS (systemic inflammatory response syndrome) 10/10/2014  . Acute renal failure superimposed on stage 4 chronic kidney disease 10/10/2014  . Anemia 10/10/2014  . Hyperkalemia 10/10/2014  . Acute encephalopathy 10/10/2014  . Membranous glomerulonephritis   . Torticollis, acquired 10/06/2014  . Essential hypertension 10/05/2014  . Acute respiratory acidosis   . ICH (intracerebral hemorrhage)   . Chronic ankle pain   . Cerebral hemorrhage   . Malignant hypertension   . Stroke 09/28/2014  . 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 Hypertensive emergency 09/28/2014  . Iron deficiency anemia 02/18/2008  . 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 ADRENAL INSUFFICIENCY, HX OF 02/18/2008  . DIABETES MELLITUS, TYPE II 10/25/2007  . HYPERLIPIDEMIA 10/25/2007  . UNSPECIFIED ULCERATION OF VULVA 10/25/2007  . Systemic lupus erythematosus 10/25/2007  . Rheumatoid arthritis  10/25/2007  . HYPERTENSION NEC 10/25/2007  . COLONIC POLYPS, HYPERPLASTIC 04/08/2006  . HEMORRHOIDS 04/08/2006    Expected Discharge Date: Expected Discharge Date: 10/25/14  Team Members Present: Physician leading conference: Dr. Claudette Laws Social Worker Present: Dossie Der, LCSW Nurse Present: Carmie End, RN PT Present: Edman Circle, PT;Marleah Beever Varner, Clearnce Hasten, PT OT Present: Perrin Maltese, Domenic Schwab, OT SLP Present: Jackalyn Lombard, SLP PPS Coordinator present : Tora Duck, RN, CRRN     Current Status/Progress Goal Weekly Team Focus  Medical   more awake, no joint swelling  maintain med stability  initiate program   Bowel/Bladder   pt. is continent of B/B  pt. will remain continent B/B upon discharge  reassess q shift   Swallow/Nutrition/ Hydration   WFL         ADL's     new eval        Mobility     new eval        Communication   [facility]         Safety/Cognition/ Behavioral Observations  min assist  supervision  monitor q shift   Pain   patient denies pain  manage and assess pain q shift  monitor for changes; assess q shift   Skin   no skin issues  patient to be free of skin breakdown upon discharge  monitor q shift      *See Care Plan and progress notes for long and short-term goals.  Barriers to Discharge: needs to be mod I    Possible Resolutions to Barriers:  Cont Rehab  Discharge Planning/Teaching Needs:    Home with children assisting her-taking time off-different shifts. Sister also involved     Team Discussion:  Doing much better medically stable and at min/supervision level.  Goals of mod/i level. 29/56 berg-short length of stay  Revisions to Treatment Plan:  New eval   Continued Need for Acute Rehabilitation Level of Care: The patient requires daily medical management by a physician with specialized training in physical medicine and rehabilitation for the following conditions: Daily direction of a multidisciplinary physical  rehabilitation program to ensure safe treatment while eliciting the highest outcome that is of practical value to the patient.: Yes Daily medical management of patient stability for increased activity during participation in an intensive rehabilitation regime.: Yes Daily analysis of laboratory values and/or radiology reports with any subsequent need for medication adjustment of medical intervention for : Neurological problems;Other  Clifford Coudriet, Lemar Livings 10/19/2014, 11:53 AM

## 2014-10-19 ENCOUNTER — Inpatient Hospital Stay (HOSPITAL_COMMUNITY): Payer: Managed Care, Other (non HMO) | Admitting: Speech Pathology

## 2014-10-19 ENCOUNTER — Inpatient Hospital Stay (HOSPITAL_COMMUNITY): Payer: Managed Care, Other (non HMO) | Admitting: Occupational Therapy

## 2014-10-19 ENCOUNTER — Inpatient Hospital Stay (HOSPITAL_COMMUNITY): Payer: Managed Care, Other (non HMO) | Admitting: Rehabilitation

## 2014-10-19 LAB — GLUCOSE, CAPILLARY
GLUCOSE-CAPILLARY: 314 mg/dL — AB (ref 70–99)
Glucose-Capillary: 196 mg/dL — ABNORMAL HIGH (ref 70–99)
Glucose-Capillary: 246 mg/dL — ABNORMAL HIGH (ref 70–99)
Glucose-Capillary: 251 mg/dL — ABNORMAL HIGH (ref 70–99)
Glucose-Capillary: 266 mg/dL — ABNORMAL HIGH (ref 70–99)
Glucose-Capillary: 294 mg/dL — ABNORMAL HIGH (ref 70–99)

## 2014-10-19 MED ORDER — INSULIN ASPART 100 UNIT/ML ~~LOC~~ SOLN
5.0000 [IU] | Freq: Three times a day (TID) | SUBCUTANEOUS | Status: DC
  Administered 2014-10-19 – 2014-10-25 (×16): 5 [IU] via SUBCUTANEOUS

## 2014-10-19 MED ORDER — AMLODIPINE BESYLATE 10 MG PO TABS
10.0000 mg | ORAL_TABLET | Freq: Every day | ORAL | Status: DC
  Administered 2014-10-19 – 2014-10-25 (×7): 10 mg via ORAL
  Filled 2014-10-19 (×8): qty 1

## 2014-10-19 MED ORDER — LABETALOL HCL 200 MG PO TABS
400.0000 mg | ORAL_TABLET | Freq: Two times a day (BID) | ORAL | Status: DC
  Administered 2014-10-19 – 2014-10-25 (×13): 400 mg via ORAL
  Filled 2014-10-19 (×14): qty 2

## 2014-10-19 NOTE — Progress Notes (Signed)
53 y.o. female with history of HTN, hyperlipidemia, lupus with mild LLE weakness as well as compliance issues who was admitted on 09/28/14 with acute onset left sided weakness and numbness as well as gaze to the right side. Patient reported being out of BP meds for 3 weeks and BP 238/88 at admission. UDS negative.  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  Carotid dopplers significant for R-40-59% ICA stenosis.  She was treated with IV diuresis for SOB due to fluid overload.  Lisinopril was dicontinued due to worsening of renal status and she was treated with IV bicarb for acidosis. Repeat cardiac echo showed EF 60-70% with AV sclerosis without stenosis.  FEES done yesterday showing mild dysphagia with pentration due to large boluses but no aspiration therefore she was started on regular textures with thin liquids. She continued to have complaints of neck, back and ankle pain as well as issues with fluctuating bouts of lethargy.  Follow up MRI/MRA brain done on 12/03 revealing changes c/w acute/subacute hemorrhage in right parmedian pons and no underlying mass evident. Minimal diffusion abnormality in high frontal lobes bilaterally likely due to calcification and probably meningioma.    Subjective/Complaints: No pain c/os  Review of Systems - Negative except as above  Objective: Vital Signs: Blood pressure 180/62, pulse 70, temperature 97.8 F (36.6 C), temperature source Oral, resp. rate 20, height $RemoveBe'5\' 5"'QkIxgmgWV$  (1.651 m), weight 69.3 kg (152 lb 12.5 oz), last menstrual period 07/18/2012, SpO2 99 %. No results found. Results for orders placed or performed during the hospital encounter of 10/17/14 (from the past 72 hour(s))  Glucose, capillary     Status: Abnormal   Collection Time: 10/17/14  5:23 PM  Result Value Ref Range   Glucose-Capillary 323 (H) 70 - 99 mg/dL  Glucose,  capillary     Status: Abnormal   Collection Time: 10/17/14  9:03 PM  Result Value Ref Range   Glucose-Capillary 264 (H) 70 - 99 mg/dL  Glucose, capillary     Status: Abnormal   Collection Time: 10/17/14 11:07 PM  Result Value Ref Range   Glucose-Capillary 210 (H) 70 - 99 mg/dL  CBC WITH DIFFERENTIAL     Status: Abnormal   Collection Time: 10/18/14  4:32 AM  Result Value Ref Range   WBC 12.8 (H) 4.0 - 10.5 K/uL   RBC 3.16 (L) 3.87 - 5.11 MIL/uL   Hemoglobin 8.4 (L) 12.0 - 15.0 g/dL   HCT 26.2 (L) 36.0 - 46.0 %   MCV 82.9 78.0 - 100.0 fL   MCH 26.6 26.0 - 34.0 pg   MCHC 32.1 30.0 - 36.0 g/dL   RDW 13.8 11.5 - 15.5 %   Platelets 263 150 - 400 K/uL   Neutrophils Relative % 87 (H) 43 - 77 %   Neutro Abs 11.1 (H) 1.7 - 7.7 K/uL   Lymphocytes Relative 9 (L) 12 - 46 %   Lymphs Abs 1.2 0.7 - 4.0 K/uL   Monocytes Relative 4 3 - 12 %   Monocytes Absolute 0.5 0.1 - 1.0 K/uL   Eosinophils Relative 0 0 - 5 %   Eosinophils Absolute 0.0 0.0 - 0.7 K/uL   Basophils Relative 0 0 - 1 %   Basophils Absolute 0.0 0.0 - 0.1 K/uL  Comprehensive metabolic panel     Status: Abnormal   Collection Time: 10/18/14  4:32 AM  Result Value  Ref Range   Sodium 138 137 - 147 mEq/L   Potassium 3.8 3.7 - 5.3 mEq/L   Chloride 98 96 - 112 mEq/L   CO2 25 19 - 32 mEq/L   Glucose, Bld 172 (H) 70 - 99 mg/dL   BUN 94 (H) 6 - 23 mg/dL   Creatinine, Ser 3.21 (H) 0.50 - 1.10 mg/dL   Calcium 9.3 8.4 - 10.5 mg/dL   Total Protein 6.2 6.0 - 8.3 g/dL   Albumin 2.1 (L) 3.5 - 5.2 g/dL   AST 17 0 - 37 U/L   ALT 24 0 - 35 U/L   Alkaline Phosphatase 97 39 - 117 U/L   Total Bilirubin 0.3 0.3 - 1.2 mg/dL   GFR calc non Af Amer 16 (L) >90 mL/min   GFR calc Af Amer 18 (L) >90 mL/min    Comment: (NOTE) The eGFR has been calculated using the CKD EPI equation. This calculation has not been validated in all clinical situations. eGFR's persistently <90 mL/min signify possible Chronic Kidney Disease.    Anion gap 15 5 - 15   Glucose, capillary     Status: Abnormal   Collection Time: 10/18/14  7:03 AM  Result Value Ref Range   Glucose-Capillary 158 (H) 70 - 99 mg/dL  Glucose, capillary     Status: Abnormal   Collection Time: 10/18/14 11:45 AM  Result Value Ref Range   Glucose-Capillary 194 (H) 70 - 99 mg/dL  Glucose, capillary     Status: Abnormal   Collection Time: 10/18/14  4:32 PM  Result Value Ref Range   Glucose-Capillary 245 (H) 70 - 99 mg/dL  Glucose, capillary     Status: Abnormal   Collection Time: 10/18/14  9:08 PM  Result Value Ref Range   Glucose-Capillary 266 (H) 70 - 99 mg/dL  Glucose, capillary     Status: Abnormal   Collection Time: 10/19/14 12:33 AM  Result Value Ref Range   Glucose-Capillary 251 (H) 70 - 99 mg/dL  Glucose, capillary     Status: Abnormal   Collection Time: 10/19/14  6:03 AM  Result Value Ref Range   Glucose-Capillary 246 (H) 70 - 99 mg/dL     HEENT: normal Cardio: RRR and no murmur Resp: CTA B/L and unlabored GI: BS positive and NT, ND Extremity:  Pulses positive and No Edema Skin:   Intact Neuro: Alert/Oriented, Normal Sensory, Abnormal Motor 5 minus/5 in the left deltoid, biceps, triceps, grip, hip flexor, knee extensor, ankle dorsal flexor plantar flexor and Abnormal FMC Ataxic/ dec FMC Musc/Skel:  Other no joint tenderness in the knees hands and feet, full active range of motion without pain Gen. no acute distress   Assessment/Plan: 1. 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 Physiatrist is providing close team supervision and 24 hour management of active medical problems listed below. Physiatrist and rehab team continue to assess barriers to discharge/monitor patient progress toward functional and medical goals. FIM: FIM - Bathing Bathing  Steps Patient Completed: Chest, Right Arm, Left Arm, Abdomen, Front perineal area, Buttocks, Right upper leg, Left upper leg, Right lower leg (including foot), Left lower leg (including foot) Bathing: 4: Steadying assist  FIM - Upper Body Dressing/Undressing Upper body dressing/undressing steps patient completed: Thread/unthread left bra strap, Thread/unthread right bra strap, Hook/unhook bra, Thread/unthread right sleeve of pullover shirt/dresss, Thread/unthread left  sleeve of pullover shirt/dress, Put head through opening of pull over shirt/dress, Pull shirt over trunk Upper body dressing/undressing: 4: Steadying assist FIM - Lower Body Dressing/Undressing Lower body dressing/undressing steps patient completed: Thread/unthread right underwear leg, Thread/unthread left underwear leg, Pull underwear up/down, Thread/unthread right pants leg, Thread/unthread left pants leg, Pull pants up/down, Don/Doff right sock, Don/Doff left sock, Don/Doff right shoe, Don/Doff left shoe, Fasten/unfasten right shoe, Fasten/unfasten left shoe Lower body dressing/undressing: 4: Steadying Assist  FIM - Toileting Toileting steps completed by patient: Adjust clothing prior to toileting, Performs perineal hygiene, Adjust clothing after toileting Toileting: 4: Steadying assist  FIM - Radio producer Devices: Insurance account manager Transfers: 4-To toilet/BSC: Min A (steadying Pt. > 75%), 4-From toilet/BSC: Min A (steadying Pt. > 75%)  FIM - Bed/Chair Transfer Bed/Chair Transfer Assistive Devices: Arm rests Bed/Chair Transfer: 5: Supine > Sit: Supervision (verbal cues/safety issues), 5: Sit > Supine: Supervision (verbal cues/safety issues), 4: Bed > Chair or W/C: Min A (steadying Pt. > 75%), 4: Chair or W/C > Bed: Min A (steadying Pt. > 75%)  FIM - Locomotion: Wheelchair Distance: 150 Locomotion: Wheelchair: 5: Travels 150 ft or more: maneuvers on rugs and over door sills with supervision, cueing or  coaxing FIM - Locomotion: Ambulation Locomotion: Ambulation Assistive Devices: Other (comment) (no AD) Ambulation/Gait Assistance: 4: Min assist Locomotion: Ambulation: 2: Travels 50 - 149 ft with minimal assistance (Pt.>75%)  Comprehension Comprehension Mode: Auditory Comprehension: 5-Follows basic conversation/direction: With no assist  Expression Expression Mode: Verbal Expression: 6-Expresses complex ideas: With extra time/assistive device  Social Interaction Social Interaction: 6-Interacts appropriately with others with medication or extra time (anti-anxiety, antidepressant).  Problem Solving Problem Solving: 5-Solves basic problems: With no assist  Memory Memory: 5-Recognizes or recalls 90% of the time/requires cueing < 10% of the time  Medical Problem List and Plan: 1. 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   2.  DVT Prophylaxis/Anticoagulation: Mechanical:  Antiembolism stockings, knee (TED hose) Bilateral lower extremities Sequential compression devices, below knee Bilateral lower extremities 3. Pain Management: flexeril prn for spasms. rx of connective tissue disorder/rx with prednsione 4. Mood: team to provide ego support as needed. Appears to be in good spirits at present 5. Neuropsych: This patient is capable of making decisions on her own behalf. 6. Skin/Wound Care: ensure appropriate nutrition and skin pressure relief measures 7. Fluids/Electrolytes/Nutrition: encourage PO intake. labwork on admit 8. Acute on chronic renal failure---baseline Cr around 3.3.             -cellcept resumed, prednisone for now 9. Mixed Connective Tissue Disorder/RA-             -pain much better controlled after steroid burst             -outpt rheum follow up             -plaquenil             -prednisone taper to begin next week 10. Likely hemolytic anemia              -continue to monitor blood counts             -immuno-supressive therapy as above 11. Steroid induced hyperglycemia:             -SSI 12. Malignant hypertension:  mainly systolic elevation at this point             -  lasix, catapres, labetalol, imdur             -further adjustments, increased catapres  LOS (Days) 2 A FACE TO FACE EVALUATION WAS PERFORMED  Beza Steppe E 10/19/2014, 8:41 AM

## 2014-10-19 NOTE — Progress Notes (Signed)
Physical Therapy Session Note  Patient Details  Name: [name] [name] MRN: [id number] Date of Birth: [birth year]  Today's Date: 10/19/2014 PT Individual Time: 1030-1130 PT Individual Time Calculation (min): 60 min   Short Term Goals: Week 1:  PT Short Term Goal 1 (Week 1): =LTG's due to ELOS  Skilled Therapeutic Interventions/Progress Updates:   Pt received sitting on EOB in room, getting meds from RN and agreeable to therapy session.  Pt able to doff non-skid socks and don shoes and tie shoes at mod I level for increased time and effort.  Had pt ambulate to/from therapy gym without AD to further challenge balance and to work on gait speed and coordination in LLE.  Provided auditory cues for stepping sequence to keep her movements more fluid.  Requires min A with cues at hips for increased anterior pelvic weight shift.  Once in therapy gym, worked on stepping RLE to 4" step with and without UE support for increased weight bearing through LLE and increased quad/glute activation.  Then progressed to tapping LLE to/from 4" step with and without UE support with 3LB weight on LLE to increase proprioceptive feedback.  Note marked improvement with weight added.  Then progressed to tapping to cones without UE support in alternating pattern.  Requires min A and requires increased cues for hip/knee flexion when removing LLE from cone.  Performed quadruped activity for WB and proprioceptive feedback through Bone Gap with reaching task with RUE and then with LUE for increased coordination.  Pt tolerated well with min facilitation for increased forward weight shift.  Ended session with gait with 3LB weight donned on LLE in controlled and carpeted environment at min A level.  Again, provided auditory cues for speed of gait for increased smoothness of step.  Note marked improvement with weight donned with min A, but lighter min A.  Pt left in bed with all needs in reach.  Discussed that if she does get back into w/c, she  can wheel around unit with LEs to improve coordination.  Pt verbalized understanding.    Therapy Documentation Precautions:  Precautions Precautions: Fall Precaution Comments: swollen joints, increased pain ( feet and ankle) Restrictions Weight Bearing Restrictions: No  Pain: Pain Assessment Pain Assessment: No/denies pain Pain Score: 0-No pain   Locomotion : Ambulation Ambulation/Gait Assistance: 4: Min assist   See FIM for current functional status  Therapy/Group: Individual Therapy  Denice Bors 10/19/2014, 12:02 PM

## 2014-10-19 NOTE — Progress Notes (Signed)
Inpatient Diabetes Program Recommendations  AACE/ADA: New Consensus Statement on Inpatient Glycemic Control (2013)  Target Ranges:  Prepandial:   less than 140 mg/dL      Peak postprandial:   less than 180 mg/dL (1-2 hours)      Critically ill patients:  140 - 180 mg/dL   Results for [facility] (MRN [id number]) as of 10/19/2014 08:40  Ref. Range 10/18/2014 11:45 10/18/2014 16:32 10/18/2014 21:08 10/19/2014 00:33 10/19/2014 06:03  Glucose-Capillary Latest Range: 70-99 mg/dL 194 (H) 245 (H) 266 (H) 251 (H) 246 (H)    Reason for assessment: elevated CBG  Diabetes history: Type 2 Outpatient Diabetes medications: none Current orders for Inpatient glycemic control: Novolog moderate correction 0-15 units tid with meals and Novolog 0-5 units qhs  Please consider starting Lantus 10 units qday and Novolog 3 units tid with meals- continue correction Novolog as ordered.  Gentry Fitz, RN, BA, MHA, CDE Diabetes Coordinator Inpatient Diabetes Program  [phone] (Team Pager) [phone] [facility]) 10/19/2014 8:41 AM

## 2014-10-19 NOTE — Progress Notes (Signed)
Social Work Assessment and Plan Social Work Assessment and Plan  Patient Details  Name: [name] [name] MRN: [id number] Date of Birth: [birth year]  Today's Date: 10/19/2014  Problem List:  Patient Active Problem List   Diagnosis Date Noted  . Acute blood loss anemia   . Acute on chronic renal failure   . Hyponatremia   . Hyperglycemia   . MCTD (mixed connective tissue disease)   . SLE (systemic lupus erythematosus)   . Secondary cardiomyopathy   . Noncompliance with medication regimen   . Tobacco abuse   . Sepsis 10/10/2014  . Leucocytosis 10/10/2014  . Fluid overload 10/10/2014  . Chronic kidney disease 10/10/2014  . SIRS (systemic inflammatory response syndrome) 10/10/2014  . Acute renal failure superimposed on stage 4 chronic kidney disease 10/10/2014  . Anemia 10/10/2014  . Hyperkalemia 10/10/2014  . Acute encephalopathy 10/10/2014  . Membranous glomerulonephritis   . Torticollis, acquired 10/06/2014  . Essential hypertension 10/05/2014  . Acute respiratory acidosis   . ICH (intracerebral hemorrhage)   . Chronic ankle pain   . Cerebral hemorrhage   . Malignant hypertension   . Stroke 09/28/2014  . 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 Hypertensive emergency 09/28/2014  . Iron deficiency anemia 02/18/2008  . 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 ADRENAL INSUFFICIENCY, HX OF 02/18/2008  . DIABETES MELLITUS, TYPE II 10/25/2007  . HYPERLIPIDEMIA 10/25/2007  . UNSPECIFIED ULCERATION OF VULVA 10/25/2007  . Systemic lupus erythematosus 10/25/2007  . Rheumatoid arthritis 10/25/2007  . HYPERTENSION NEC 10/25/2007  . COLONIC POLYPS, HYPERPLASTIC 04/08/2006  . HEMORRHOIDS 04/08/2006   Past Medical History:  Past Medical History  Diagnosis Date  . Hypertension   . Lupus   . CKD (chronic kidney disease)     due to lupus/Dr. [name]  . Diabetes mellitus   . Stenosis of cervical spine region     with HNP at C5/6, C6/7  . Metatarsal bone fracture  right    4th  . 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 Arthritis     RA  . Anemia   . Stroke    Past Surgical History:  Past Surgical History  Procedure Laterality Date  . Breast biopsy     Social History:  reports that she has been smoking Cigarettes.  She has a 15 pack-year smoking history. She has never used smokeless tobacco. She reports that she does not drink alcohol or use illicit drugs.  Family / Support Systems Marital Status: Divorced Patient Roles: Parent, Other (Comment), Partner (Employee) Spouse/Significant Other: [name] [name] [phone]  [phone] Children: [name] [name]  [phone]  [phone]   [name] [name] [phone] Other Supports: [name] [name]  [phone]-cell Anticipated Caregiver: All family to pull together to provide assist-boyfriend was going to assist daytime but having surgery 12/22. Ability/Limitations of Caregiver: family will provide 24 hr supervision level-short time Caregiver Availability: 24/7 Family Dynamics: Close knit family they all live together-three children and pt.  All work different shifts and can do what pt needs.  Pt's sister is also involved and will assist with what she can.  So pleased with how well pt is doing now.  Social History Preferred language: English Religion: Holiness Cultural Background: No issues Education: High School Read: Yes Write: Yes Employment Status: Employed Name of Employer: Advertising account executive Return to Work Plans: Plans to return once recovered Freight forwarder Issues: No issues Guardian/Conservator: None-according to MD pt is capable of making  her own decisions while here.   Abuse/Neglect Physical Abuse: Denies Verbal Abuse: Denies Sexual Abuse: Denies Exploitation of patient/patient's resources: Denies Self-Neglect: Denies  Emotional Status Pt's affect, behavior adn adjustment status: Pt is motivated and  awake now to be able to participate in therapies.  She has no memory of the past admission on rehab and is being told how she did. She has always been able to take care of herself and realizes she needs to change some of her ways now. Recent Psychosocial Issues: Other medical issues-lupus and arthritis Pyschiatric History: No issues deferrde depression screen due to doing so much better and reaching mod/i level goals. Substance Abuse History: Tobacco plans to quit now, awar eof resources available to her.  Patient / Family Perceptions, Expectations & Goals Pt/Family understanding of illness & functional limitations: Pt and sister are able to explain her stroke/bleed and is recovering from.  Both have spoken with MD and feel they are being heard and needs are being addressed. Premorbid pt/family roles/activities: Mom, Sister, Glass blower/designer, Girlfriend, etc Anticipated changes in roles/activities/participation: resume Pt/family expectations/goals: Pt states: " I can't believe how bad I was, they are telling me this."  Sister states: ": Isn't it a miracle how much better she is?  This is her old self."  Recruitment consultant: None Premorbid Home Care/DME Agencies: None Transportation available at discharge: [employer] referrals recommended: Neuropsychology, Support group (specify)  Discharge Planning Living Arrangements: Children Support Systems: Spouse/significant other, Children, Other relatives, Water engineer, Social worker community Type of Residence: Private residence Insurance Resources: Multimedia programmer (specify) Psychologist, counselling) Financial Resources: Employment Museum/gallery curator Screen Referred: No Living Expenses: Lives with family Money Management: Patient Does the patient have any problems obtaining your medications?: No Home Management: pt and children Patient/Family Preliminary Plans: Return home wiht chidlren to assist along wiht boyfriend and sister.  Pt is on target to  reach mod/i level and will not need 24 hr care.  All very pleased with this.  Pt plans to change her lifestyle habits and follow up with MD for her lupus now. Social Work Anticipated Follow Up Needs: HH/OP, Support Group  Clinical Impression Pleasant alert patient who is another person compared to her last admit here.  She is progressing quickly and will reach mod/i level. Her family is very supportive and involved. She should do well here and be short length of stay.  Elease Hashimoto 10/19/2014, 3:58 PM

## 2014-10-19 NOTE — Progress Notes (Signed)
Social Work Patient ID: [name] [name], female   DOB: January 20, 1961, 53 y.o.   MRN: [id number] Met with pt to inform team conference goals-mod/i level and discharge 12/23.  She is pleased with how well she is doing and has no memory of her past admit To rehab.  Will touch base with sister and get family in for education.  Pt is aware she will need to get into a lupus MD and she has someone to call. Work toward discharge.

## 2014-10-19 NOTE — Progress Notes (Signed)
Occupational Therapy Session Note  Patient Details  Name: [name] [name] MRN: [id number] Date of Birth: 1961-07-20  Today's Date: 10/19/2014 OT Individual Time:  8938- 1017   60 minutes   Short Term Goals: Week 1:  OT Short Term Goal 1 (Week 1): STGs= LTGs secondary to estimated short LOS  Skilled Therapeutic Interventions/Progress Updates:  Upon entering room, pt supine in bed sleeping with no c/o pain during session. Skilled OT session with focus on functional transfer, functional mobility, self care, dynamic standing balance, and pt education. Pt performed bed mobility with supervision for supine >sit. Pt ambulated with RW and steady assist to bathroom for toileting. Pt required steady assist for clothing management and hygiene after void. Pt then ambulating to TTB with steady assist for transfer. Pt performing bathing at shower level with steady assist when standing to wash buttocks and peri area. She obtained all items for dressing for drawers with steady assist for balance with use of RW. Pt seated on EOB for dressing tasks with pt able to hook bra as well as tie shoes for bilateral [facility] tasks. OT placed breakfast tray in front of pt and she was able to open all containers. Bed alarm and call bell within reach upon exiting the room.   Therapy Documentation Precautions:  Precautions Precautions: Fall Precaution Comments: swollen joints, increased pain ( feet and ankle) Restrictions Weight Bearing Restrictions: No  See FIM for current functional status  Therapy/Group: Individual Therapy  Phineas Semen 10/19/2014, 7:47 AM

## 2014-10-19 NOTE — Progress Notes (Signed)
Speech Language Pathology Daily Session Note  Patient Details  Name: [name] [name] MRN: [id number] Date of Birth: January 14, 1961  Today's Date: 10/19/2014 SLP Individual Time: 0930-1030 SLP Individual Time Calculation (min): 60 min  Short Term Goals: Week 1: SLP Short Term Goal 1 (Week 1): STGs=LTGs due to length of stay  Skilled Therapeutic Interventions: Skilled treatment session focused on addressing cognitive goals.  SLP facilitated session with Min verbal cues to self-monitor and correct moderately complex calculations during completion of a daily household management task.  SLP also facilitated session with implementation of an external aid to address increase recall of new medications, functions and frequencies.  Patient initially required Mod cues to utilize aid but following 2 repetitions patient was able to utilize with Supervision question cues.  Continue with current plan of care.  FIM:  Comprehension Comprehension Mode: Auditory Comprehension: 5-Follows basic conversation/direction: With no assist Expression Expression Mode: Verbal Expression: 6-Expresses complex ideas: With extra time/assistive device Social Interaction Social Interaction: 6-Interacts appropriately with others with medication or extra time (anti-anxiety, antidepressant). Problem Solving Problem Solving: 5-Solves basic problems: With no assist Memory Memory: 5-Recognizes or recalls 90% of the time/requires cueing < 10% of the time  Pain Pain Assessment Pain Assessment: No/denies pain Pain Score: 0-No pain  Therapy/Group: Individual Therapy  Carmelia Roller., [name] [phone]  [place] 10/19/2014, 11:58 AM

## 2014-10-19 NOTE — IPOC Note (Signed)
Overall Plan of Care [facility]) Patient Details Name: [name] [name] MRN: [id number] DOB: 21-May-1961  Admitting Diagnosis: R Pontine hemorrhage  Hospital Problems: Principal Problem:   MCTD (mixed connective tissue disease) Active Problems:   Rheumatoid arthritis   Pontine hemorrhage   Malignant hypertension   Acute renal failure superimposed on stage 4 chronic kidney disease     Functional Problem List: Nursing    PT Balance, Endurance, Motor, Safety  OT Balance, Sensory, Endurance, Motor, Safety  SLP Cognition  TR         Basic ADL's: OT Grooming, Bathing, Dressing, Toileting     Advanced  ADL's: OT Simple Meal Preparation, Laundry     Transfers: PT Bed Mobility, Bed to Chair, Car, [employer], Futures trader, Metallurgist: PT Ambulation, Emergency planning/management officer, Stairs     Additional Impairments: OT None  SLP     Problem Solving, Memory, Attention, Awareness  TR      Anticipated Outcomes Item Anticipated Outcome  Self Feeding n/a  Swallowing      Basic self-care  Mod I  Toileting  Mod I   Bathroom Transfers toilet - Mod I  shower - S  Bowel/Bladder  continent of bowel and bladder with modified independence  Transfers  mod I   Locomotion  mod I   Communication     Cognition  Supervision with complex  Pain  3 or less on scale of 1-10  Safety/Judgment  supervision   Therapy Plan: PT Intensity: Minimum of 1-2 x/day ,45 to 90 minutes PT Frequency: 5 out of 7 days PT Duration Estimated Length of Stay: 7 days  OT Intensity: Minimum of 1-2 x/day, 45 to 90 minutes OT Frequency: 5 out of 7 days OT Duration/Estimated Length of Stay: 7 days SLP Intensity: Minumum of 1-2 x/day, 30 to 90 minutes SLP Frequency: 5 out of 7 days SLP Duration/Estimated Length of Stay: 12/23       Team Interventions: Nursing Interventions Patient/Family Education, Bladder Management, Bowel Management, Disease Management/Prevention, Pain Management, Medication  Management, Skin Care/Wound Management, Discharge Planning, Psychosocial Support  PT interventions Ambulation/gait training, Training and development officer, Cognitive remediation/compensation, Community reintegration, Discharge planning, Disease management/prevention, DME/adaptive equipment instruction, Functional mobility training, Neuromuscular re-education, Patient/family education, UE/LE Strength taining/ROM, UE/LE Coordination activities, Wheelchair propulsion/positioning  OT Interventions Training and development officer, Discharge planning, Self Care/advanced ADL retraining, Therapeutic Activities, UE/LE Coordination activities, Cognitive remediation/compensation, Functional mobility training, Patient/family education, Therapeutic Exercise, Visual/perceptual remediation/compensation, Community reintegration, Engineer, drilling, Neuromuscular re-education, Psychosocial support, UE/LE Strength taining/ROM  SLP Interventions Cognitive remediation/compensation, English as a second language teacher, Environmental controls, Functional tasks, Internal/external aids, Patient/family education, Medication managment, Therapeutic Activities  TR Interventions    SW/CM Interventions Discharge Planning, Psychosocial Support, Patient/Family Education    Team Discharge Planning: Destination: PT-Home ,OT- Home , SLP-Home Projected Follow-up: PT-Home health PT, Outpatient PT, OT-  Home health OT, Outpatient OT, 24 hour supervision/assistance, SLP-Outpatient SLP, Other (comment) (Intermittent Supervision ) Projected Equipment Needs: PT-To be determined, OT- To be determined, SLP-None recommended by SLP Equipment Details: PT-anticipate patient will need RW, OT-  Patient/family involved in discharge planning: PT- Patient,  OT-Patient, SLP-Patient  MD ELOS: 7-10d Medical Rehab Prognosis:  Excellent Assessment: 53 y.o. female with history of HTN, hyperlipidemia, lupus with mild LLE weakness as well as compliance issues who was  admitted on 09/28/14 with acute onset left sided weakness and numbness as well as gaze to the right side. Patient reported being out of BP meds for 3 weeks and BP 238/88 at admission.  UDS negative. 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 Carotid dopplers significant for R-40-59% ICA stenosis. She was treated with IV diuresis for SOB due to fluid overload. Lisinopril was dicontinued due to worsening of renal status and she was treated with IV bicarb for acidosis. Repeat cardiac echo showed EF 60-70% with AV sclerosis without stenosis. FEES done yesterday showing mild dysphagia with pentration due to large boluses but no aspiration therefore she was started on regular textures with thin liquids. She continued to have complaints of neck, back and ankle pain as well as issues with fluctuating bouts of lethargy. Follow up MRI/MRA brain done on 12/03 revealing changes c/w acute/subacute hemorrhage in right parmedian pons and no underlying mass evident. Minimal diffusion abnormality in high frontal lobes bilaterally likely due to calcification and probably meningioma.  Patient with resultant left sided weakness impacting mobility as well as safety.  CIR was recommended by rehab team and patient was admitted for inpatient rehab program on 10/05/14. Heat alternating with ice was added to help with left neck and shoulder pain. Oxycodone was used additionally for ongoing issues with diffuse arthralgias. Blood pressures were monitored on TID basis and were resonably controlled. H/H was monitored with serial checks due to downward trend. Stool guaiac X 1 was negative but recheck labs on 12/8 showed further drop in H/H to 6.1 with elevated WBC 21.9 as well as hyperkalemia with K- 5.5. She was febrile with T-max 101 was noted to be lethargic with complaints of diffuse pain affecting bilateral  knees, feet, elbows as well as neck and feet. Dr. Clementeen Graham was consulted for input and recommended transfer to stepdown due to concerns of sepsis as well as GIB.  Now requiring 24/7 Rehab RN,MD, as well as CIR level PT, OT and SLP.  Treatment team will focus on ADLs and mobility with goals set at Mod I   See Team Conference Notes for weekly updates to the plan of care

## 2014-10-19 NOTE — Progress Notes (Signed)
Recreational Therapy Session Note  Patient Details  Name: [name] [name] MRN: [id number] Date of Birth: 06-24-1961 Today's Date: 10/19/2014  Order received & chart reviewed.  Pt with short LOS, therefore full eval deferred.  Pt is appropriate for community reintegration & agreeable to participate in an outing on Monday.   Fausto Sampedro .  10/19/2014, 3:23 PM

## 2014-10-20 ENCOUNTER — Inpatient Hospital Stay (HOSPITAL_COMMUNITY): Payer: Managed Care, Other (non HMO) | Admitting: Speech Pathology

## 2014-10-20 ENCOUNTER — Inpatient Hospital Stay (HOSPITAL_COMMUNITY): Payer: Managed Care, Other (non HMO) | Admitting: Occupational Therapy

## 2014-10-20 ENCOUNTER — Inpatient Hospital Stay (HOSPITAL_COMMUNITY): Payer: Managed Care, Other (non HMO) | Admitting: Rehabilitation

## 2014-10-20 LAB — BASIC METABOLIC PANEL
ANION GAP: 15 (ref 5–15)
BUN: 85 mg/dL — ABNORMAL HIGH (ref 6–23)
CHLORIDE: 99 meq/L (ref 96–112)
CO2: 23 meq/L (ref 19–32)
CREATININE: 3.27 mg/dL — AB (ref 0.50–1.10)
Calcium: 9.3 mg/dL (ref 8.4–10.5)
GFR calc Af Amer: 18 mL/min — ABNORMAL LOW (ref >90)
GFR calc non Af Amer: 15 mL/min — ABNORMAL LOW (ref >90)
Glucose, Bld: 173 mg/dL — ABNORMAL HIGH (ref 70–99)
Potassium: 4.4 mEq/L (ref 3.7–5.3)
Sodium: 137 mEq/L (ref 137–147)

## 2014-10-20 LAB — CBC
HEMATOCRIT: 25.7 % — AB (ref 36.0–46.0)
HEMOGLOBIN: 8.6 g/dL — AB (ref 12.0–15.0)
MCH: 28.4 pg (ref 26.0–34.0)
MCHC: 33.5 g/dL (ref 30.0–36.0)
MCV: 84.8 fL (ref 78.0–100.0)
Platelets: 238 10*3/uL (ref 150–400)
RBC: 3.03 MIL/uL — ABNORMAL LOW (ref 3.87–5.11)
RDW: 14.1 % (ref 11.5–15.5)
WBC: 11 10*3/uL — ABNORMAL HIGH (ref 4.0–10.5)

## 2014-10-20 LAB — GLUCOSE, CAPILLARY
GLUCOSE-CAPILLARY: 142 mg/dL — AB (ref 70–99)
GLUCOSE-CAPILLARY: 229 mg/dL — AB (ref 70–99)
Glucose-Capillary: 161 mg/dL — ABNORMAL HIGH (ref 70–99)
Glucose-Capillary: 133 mg/dL — ABNORMAL HIGH (ref 70–99)

## 2014-10-20 NOTE — Progress Notes (Signed)
Physical Therapy Session Note  Patient Details  Name: [name] [name] MRN: [id number] Date of Birth: 07/02/1961  Today's Date: 10/20/2014 PT Individual Time: 1300-1400 PT Individual Time Calculation (min): 60 min   Short Term Goals: Week 1:  PT Short Term Goal 1 (Week 1): =LTG's due to ELOS  Skilled Therapeutic Interventions/Progress Updates:   Pt received in restroom with RN present in room for S and to provide meds.  Pt able to stand and manage clothing and peri care at S level and transferred back to w/c at S level.  Wheeled out of room and had pt doff non-skid socks and don shoes.  She was able to perform all tasks.  Daughter present during session to observe pts current level of functioning.  Explained that expected outcomes are mod I level with use of RW, however during therapy we will challenge her to ambulate without device to work on balance and more fluent stepping sequence.  Also educated daughter that our goals are mod I for mobility but that for higher level activities like medication and money management, she will still need supervision.  Daughter verbalized understanding.  Pt ambulated to/from therapy gym without device at min A level with facilitation for increased gait speed for more fluid stepping.  Once in therapy gym, donned 3lb ankle weight to LLE while working on stepping tasks to cones for coordination.  Also applied small heel lift in L shoe to prevent some L knee hyperextension.  Note mild improvement, but still intermittently forces back L knee.  Worked on gait with ankle weight donned x 130' at min/guard level, again with facilitation for increased gait speed.  Performed car transfer to simulated home vehicle height at S level with cues for hand placement and safe technique.  Ambulated back down hallway and then performed stairs x 11, 6" steps with R rail at min A level with cues for sequencing and technique.  Ended session with seated nustep x 5 mins at level 4-5 with LEs only  for increased resistance for proprioceptive feedback.  Ambulated back to room and left in recliner with all needs in reach.   Therapy Documentation Precautions:  Precautions Precautions: Fall Precaution Comments: swollen joints, increased pain ( feet and ankle) Restrictions Weight Bearing Restrictions: No   Vital Signs: Therapy Vitals Temp: 97.9 F (36.6 C) Temp Source: Oral Pulse Rate: 72 Resp: 18 BP: (!) 165/74 mmHg (165/74, pulse 72) Patient Position (if appropriate): Sitting Oxygen Therapy SpO2: 100 % Pain: Pt with no c/o pain during session.      See FIM for current functional status  Therapy/Group: Individual Therapy  Denice Bors 10/20/2014, 4:26 PM

## 2014-10-20 NOTE — Progress Notes (Signed)
53 y.o. female with history of HTN, hyperlipidemia, lupus with mild LLE weakness as well as compliance issues who was admitted on 09/28/14 with acute onset left sided weakness and numbness as well as gaze to the right side. Patient reported being out of BP meds for 3 weeks and BP 238/88 at admission. UDS negative.  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  Carotid dopplers significant for R-40-59% ICA stenosis.  She was treated with IV diuresis for SOB due to fluid overload.  Lisinopril was dicontinued due to worsening of renal status and she was treated with IV bicarb for acidosis. Repeat cardiac echo showed EF 60-70% with AV sclerosis without stenosis.  FEES done yesterday showing mild dysphagia with pentration due to large boluses but no aspiration therefore she was started on regular textures with thin liquids. She continued to have complaints of neck, back and ankle pain as well as issues with fluctuating bouts of lethargy.  Follow up MRI/MRA brain done on 12/03 revealing changes c/w acute/subacute hemorrhage in right parmedian pons and no underlying mass evident. Minimal diffusion abnormality in high frontal lobes bilaterally likely due to calcification and probably meningioma.    Subjective/Complaints: No joint pain  Feels like fluid on Left side of abdomen  Review of Systems - Negative except as above  Objective: Vital Signs: Blood pressure 170/69, pulse 80, temperature 97.9 F (36.6 C), temperature source Oral, resp. rate 18, height $RemoveBe'5\' 5"'DadOJVsWL$  (1.651 m), weight 69.3 kg (152 lb 12.5 oz), last menstrual period 07/18/2012, SpO2 97 %. No results found. Results for orders placed or performed during the hospital encounter of 10/17/14 (from the past 72 hour(s))  Glucose, capillary     Status: Abnormal   Collection Time: 10/17/14  5:23 PM  Result Value Ref Range    Glucose-Capillary 323 (H) 70 - 99 mg/dL  Glucose, capillary     Status: Abnormal   Collection Time: 10/17/14  9:03 PM  Result Value Ref Range   Glucose-Capillary 264 (H) 70 - 99 mg/dL  Glucose, capillary     Status: Abnormal   Collection Time: 10/17/14 11:07 PM  Result Value Ref Range   Glucose-Capillary 210 (H) 70 - 99 mg/dL  CBC WITH DIFFERENTIAL     Status: Abnormal   Collection Time: 10/18/14  4:32 AM  Result Value Ref Range   WBC 12.8 (H) 4.0 - 10.5 K/uL   RBC 3.16 (L) 3.87 - 5.11 MIL/uL   Hemoglobin 8.4 (L) 12.0 - 15.0 g/dL   HCT 26.2 (L) 36.0 - 46.0 %   MCV 82.9 78.0 - 100.0 fL   MCH 26.6 26.0 - 34.0 pg   MCHC 32.1 30.0 - 36.0 g/dL   RDW 13.8 11.5 - 15.5 %   Platelets 263 150 - 400 K/uL   Neutrophils Relative % 87 (H) 43 - 77 %   Neutro Abs 11.1 (H) 1.7 - 7.7 K/uL   Lymphocytes Relative 9 (L) 12 - 46 %   Lymphs Abs 1.2 0.7 - 4.0 K/uL   Monocytes Relative 4 3 - 12 %   Monocytes Absolute 0.5 0.1 - 1.0 K/uL   Eosinophils Relative 0 0 - 5 %   Eosinophils Absolute 0.0 0.0 - 0.7 K/uL   Basophils Relative 0 0 - 1 %   Basophils Absolute 0.0 0.0 - 0.1 K/uL  Comprehensive metabolic panel     Status: Abnormal  Collection Time: 10/18/14  4:32 AM  Result Value Ref Range   Sodium 138 137 - 147 mEq/L   Potassium 3.8 3.7 - 5.3 mEq/L   Chloride 98 96 - 112 mEq/L   CO2 25 19 - 32 mEq/L   Glucose, Bld 172 (H) 70 - 99 mg/dL   BUN 94 (H) 6 - 23 mg/dL   Creatinine, Ser 3.21 (H) 0.50 - 1.10 mg/dL   Calcium 9.3 8.4 - 10.5 mg/dL   Total Protein 6.2 6.0 - 8.3 g/dL   Albumin 2.1 (L) 3.5 - 5.2 g/dL   AST 17 0 - 37 U/L   ALT 24 0 - 35 U/L   Alkaline Phosphatase 97 39 - 117 U/L   Total Bilirubin 0.3 0.3 - 1.2 mg/dL   GFR calc non Af Amer 16 (L) >90 mL/min   GFR calc Af Amer 18 (L) >90 mL/min    Comment: (NOTE) The eGFR has been calculated using the CKD EPI equation. This calculation has not been validated in all clinical situations. eGFR's persistently <90 mL/min signify possible  Chronic Kidney Disease.    Anion gap 15 5 - 15  Glucose, capillary     Status: Abnormal   Collection Time: 10/18/14  7:03 AM  Result Value Ref Range   Glucose-Capillary 158 (H) 70 - 99 mg/dL  Glucose, capillary     Status: Abnormal   Collection Time: 10/18/14 11:45 AM  Result Value Ref Range   Glucose-Capillary 194 (H) 70 - 99 mg/dL  Glucose, capillary     Status: Abnormal   Collection Time: 10/18/14  4:32 PM  Result Value Ref Range   Glucose-Capillary 245 (H) 70 - 99 mg/dL  Glucose, capillary     Status: Abnormal   Collection Time: 10/18/14  9:08 PM  Result Value Ref Range   Glucose-Capillary 266 (H) 70 - 99 mg/dL  Glucose, capillary     Status: Abnormal   Collection Time: 10/19/14 12:33 AM  Result Value Ref Range   Glucose-Capillary 251 (H) 70 - 99 mg/dL  Glucose, capillary     Status: Abnormal   Collection Time: 10/19/14  6:03 AM  Result Value Ref Range   Glucose-Capillary 246 (H) 70 - 99 mg/dL  Glucose, capillary     Status: Abnormal   Collection Time: 10/19/14 11:27 AM  Result Value Ref Range   Glucose-Capillary 196 (H) 70 - 99 mg/dL  Glucose, capillary     Status: Abnormal   Collection Time: 10/19/14  4:33 PM  Result Value Ref Range   Glucose-Capillary 314 (H) 70 - 99 mg/dL  Glucose, capillary     Status: Abnormal   Collection Time: 10/19/14  8:22 PM  Result Value Ref Range   Glucose-Capillary 294 (H) 70 - 99 mg/dL  Basic metabolic panel     Status: Abnormal   Collection Time: 10/20/14  5:44 AM  Result Value Ref Range   Sodium 137 137 - 147 mEq/L   Potassium 4.4 3.7 - 5.3 mEq/L   Chloride 99 96 - 112 mEq/L   CO2 23 19 - 32 mEq/L   Glucose, Bld 173 (H) 70 - 99 mg/dL   BUN 85 (H) 6 - 23 mg/dL   Creatinine, Ser 3.27 (H) 0.50 - 1.10 mg/dL   Calcium 9.3 8.4 - 10.5 mg/dL   GFR calc non Af Amer 15 (L) >90 mL/min   GFR calc Af Amer 18 (L) >90 mL/min    Comment: (NOTE) The eGFR has been calculated using the CKD EPI  equation. This calculation has not been validated  in all clinical situations. eGFR's persistently <90 mL/min signify possible Chronic Kidney Disease.    Anion gap 15 5 - 15  CBC     Status: Abnormal   Collection Time: 10/20/14  5:44 AM  Result Value Ref Range   WBC 11.0 (H) 4.0 - 10.5 K/uL   RBC 3.03 (L) 3.87 - 5.11 MIL/uL   Hemoglobin 8.6 (L) 12.0 - 15.0 g/dL   HCT 25.7 (L) 36.0 - 46.0 %   MCV 84.8 78.0 - 100.0 fL   MCH 28.4 26.0 - 34.0 pg   MCHC 33.5 30.0 - 36.0 g/dL   RDW 14.1 11.5 - 15.5 %   Platelets 238 150 - 400 K/uL  Glucose, capillary     Status: Abnormal   Collection Time: 10/20/14  6:42 AM  Result Value Ref Range   Glucose-Capillary 161 (H) 70 - 99 mg/dL     HEENT: normal Cardio: RRR and no murmur Resp: CTA B/L and unlabored GI: BS positive and NT, mild distension, +tympani Extremity:  Pulses positive and No Edema Skin:   Intact Neuro: Alert/Oriented, Normal Sensory, Abnormal Motor 5 minus/5 in the left deltoid, biceps, triceps, grip, hip flexor, knee extensor, ankle dorsal flexor plantar flexor and Abnormal FMC Ataxic/ dec FMC Musc/Skel:  Other no joint tenderness in the knees hands and feet, full active range of motion without pain Gen. no acute distress   Assessment/Plan: 1. 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 Physiatrist is providing close team supervision and 24 hour management of active medical problems listed below. Physiatrist and rehab team continue to assess barriers to discharge/monitor patient progress toward functional and medical goals. FIM: FIM - Bathing Bathing Steps Patient Completed: Chest, Right Arm, Left Arm, Abdomen, Front perineal area, Buttocks, Right upper leg, Left upper leg, Right lower leg (including foot), Left lower leg (including foot) Bathing: 4: Steadying  assist  FIM - Upper Body Dressing/Undressing Upper body dressing/undressing steps patient completed: Thread/unthread left bra strap, Thread/unthread right bra strap, Hook/unhook bra, Thread/unthread right sleeve of pullover shirt/dresss, Thread/unthread left sleeve of pullover shirt/dress, Put head through opening of pull over shirt/dress, Pull shirt over trunk Upper body dressing/undressing: 4: Steadying assist FIM - Lower Body Dressing/Undressing Lower body dressing/undressing steps patient completed: Thread/unthread right underwear leg, Thread/unthread left underwear leg, Pull underwear up/down, Thread/unthread right pants leg, Thread/unthread left pants leg, Pull pants up/down, Don/Doff right sock, Don/Doff left sock, Don/Doff right shoe, Don/Doff left shoe, Fasten/unfasten right shoe, Fasten/unfasten left shoe Lower body dressing/undressing: 4: Steadying Assist  FIM - Toileting Toileting steps completed by patient: Adjust clothing prior to toileting, Performs perineal hygiene, Adjust clothing after toileting Toileting: 4: Steadying assist  FIM - Radio producer Devices: Insurance account manager Transfers: 4-To toilet/BSC: Min A (steadying Pt. > 75%), 4-From toilet/BSC: Min A (steadying Pt. > 75%)  FIM - Bed/Chair Transfer Bed/Chair Transfer Assistive Devices: Arm rests Bed/Chair Transfer: 5: Supine > Sit: Supervision (verbal cues/safety issues), 5: Sit > Supine: Supervision (verbal cues/safety issues), 4: Bed > Chair or W/C: Min A (steadying Pt. > 75%), 4: Chair or W/C > Bed: Min A (steadying Pt. > 75%)  FIM - Locomotion: Wheelchair Distance: 150 Locomotion: Wheelchair: 0: Activity did not occur FIM - Locomotion: Ambulation Locomotion: Ambulation Assistive Devices: Other (comment) (no AD) Ambulation/Gait  Assistance: 4: Min assist Locomotion: Ambulation: 4: Travels 150 ft or more with minimal assistance (Pt.>75%)  Comprehension Comprehension Mode:  Auditory Comprehension: 5-Follows basic conversation/direction: With no assist  Expression Expression Mode: Verbal Expression: 6-Expresses complex ideas: With extra time/assistive device  Social Interaction Social Interaction: 6-Interacts appropriately with others with medication or extra time (anti-anxiety, antidepressant).  Problem Solving Problem Solving: 5-Solves complex 90% of the time/cues < 10% of the time  Memory Memory: 5-Recognizes or recalls 90% of the time/requires cueing < 10% of the time  Medical Problem List and Plan: 1. 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   2.  DVT Prophylaxis/Anticoagulation: Mechanical:  Antiembolism stockings, knee (TED hose) Bilateral lower extremities Sequential compression devices, below knee Bilateral lower extremities 3. Pain Management: flexeril prn for spasms. rx of connective tissue disorder/rx with prednsione 4. Mood: team to provide ego support as needed. Appears to be in good spirits at present 5. Neuropsych: This patient is capable of making decisions on her own behalf. 6. Skin/Wound Care: ensure appropriate nutrition and skin pressure relief measures 7. Fluids/Electrolytes/Nutrition: encourage PO intake. labwork on admit 8. Acute on chronic renal failure---baseline Cr around 3.3.             -cellcept resumed, prednisone for now 9. Mixed Connective Tissue Disorder/RA-             -pain much better controlled after steroid burst             -outpt rheum follow up             -plaquenil             -prednisone taper to begin next week 10. Likely hemolytic anemia             -continue to monitor blood counts             -immuno-supressive therapy as above 11. Steroid induced hyperglycemia:             -SSI 12. Malignant hypertension:  mainly systolic elevation at this point             -lasix, catapres,  labetalol, imdur             -further adjustments, increased catapres  LOS (Days) 3 A FACE TO FACE EVALUATION WAS PERFORMED  Irys Nigh E 10/20/2014, 8:46 AM

## 2014-10-20 NOTE — Progress Notes (Signed)
Contacted [facility] office for patient referral. They report that patient has not been seen by Dr. Shirley Friar for about 3 years and will have to be set up as a new patient.  They will need information faxed for MD to review prior to setting up appointment. Contacted Dr. Arlean Hopping office (does not have appt till Feb 2016) and they too need records faxed to MD for review prior to appt set up.  Records faxed to [facility] [place] and Dr. Arlean Hopping office. They will contact the patient with follow up appointment.

## 2014-10-20 NOTE — Progress Notes (Addendum)
Speech Language Pathology Daily Session Note  Patient Details  Name: [name] [name] MRN: [id number] Date of Birth: May 12, 1961  Today's Date: 10/20/2014 SLP Individual Time: 4097-3532 SLP Individual Time Calculation (min): 75 min  Short Term Goals: Week 1: SLP Short Term Goal 1 (Week 1): STGs=LTGs due to length of stay  Skilled Therapeutic Interventions: Skilled treatment session focused on addressing cognitive and self-care goals. SLP facilitated session by initially providing Mod A multimodal cues which faded to Min A by end of task for problem solving and organization during a mildly complex task of organizing a 4 time a day pill box.  SLP also facilitated session by providing supervision multimodal cues for recall and problem solving during a mildly complex, new learning card task.  Continue with current plan of care.    Last 15 minutes of session was to make-up for previously missed minutes.   FIM:  Comprehension Comprehension Mode: Auditory Comprehension: 5-Understands complex 90% of the time/Cues < 10% of the time Expression Expression Mode: Verbal Expression: 6-Expresses complex ideas: With extra time/assistive device Social Interaction Social Interaction: 6-Interacts appropriately with others with medication or extra time (anti-anxiety, antidepressant). Problem Solving Problem Solving: 5-Solves complex 90% of the time/cues < 10% of the time Memory Memory: 5-Recognizes or recalls 90% of the time/requires cueing < 10% of the time  Pain No/Denies Pain   Therapy/Group: Individual Therapy  Cecilia Vancleve 10/20/2014, 3:46 PM

## 2014-10-20 NOTE — Progress Notes (Signed)
Occupational Therapy Session Note  Patient Details  Name: SANTORIA CHASON MRN: [id number] Date of Birth: 05/14/1961  Today's Date: 10/20/2014 OT Individual [id number]  - [id number]   Duration: 60 min   Short Term Goals: Week 1:  OT Short Term Goal 1 (Week 1): STGs= LTGs secondary to estimated short LOS  Skilled Therapeutic Interventions/Progress Updates:  Upon entering the room, pt supine in bed with no c/o pain this session. Skilled OT session with focus on dynamic standing balance, functional mobility, functional transfers, and self care. Pt ambulating with close supervision and use of RW to obtain all clothing and items needed for B & D session. Pt ambulating into bathroom for toileting and toilet transfer onto standard toilet with supervision. Pt required steady assist for transfer onto TTB for bathing. After bath completed pt seated in recliner chair for dressing as pt reporting fatigue with activity. Pt stood at sink for grooming tasks with close supervision. Upon exiting the room, NT present to take BP.  Therapy Documentation Precautions:  Precautions Precautions: Fall Precaution Comments: swollen joints, increased pain ( feet and ankle) Restrictions Weight Bearing Restrictions: No  See FIM for current functional status  Therapy/Group: Individual Therapy  Phineas Semen 10/20/2014, 8:41 PM

## 2014-10-21 ENCOUNTER — Inpatient Hospital Stay (HOSPITAL_COMMUNITY): Payer: Managed Care, Other (non HMO) | Admitting: *Deleted

## 2014-10-21 ENCOUNTER — Inpatient Hospital Stay (HOSPITAL_COMMUNITY): Payer: Managed Care, Other (non HMO) | Admitting: Speech Pathology

## 2014-10-21 ENCOUNTER — Inpatient Hospital Stay (HOSPITAL_COMMUNITY): Payer: Managed Care, Other (non HMO) | Admitting: Occupational Therapy

## 2014-10-21 LAB — GLUCOSE, CAPILLARY
GLUCOSE-CAPILLARY: 135 mg/dL — AB (ref 70–99)
GLUCOSE-CAPILLARY: 84 mg/dL (ref 70–99)
Glucose-Capillary: 235 mg/dL — ABNORMAL HIGH (ref 70–99)
Glucose-Capillary: 218 mg/dL — ABNORMAL HIGH (ref 70–99)

## 2014-10-21 NOTE — Progress Notes (Signed)
Occupational Therapy Session Note  Patient Details  Name: [name] [name] MRN: [id number] Date of Birth: [birth year]  Today's Date: 10/21/2014 OT Individual Time: 8341-9622 and 1330-1415 OT Individual Time Calculation (min): 45 min and 45 min   Short Term Goals: Week 1:  OT Short Term Goal 1 (Week 1): STGs= LTGs secondary to estimated short LOS  Skilled Therapeutic Interventions/Progress Updates:    1) Engaged in ADL retraining with focus on functional mobility with RW, transfers, dynamic standing balance, and increased independence with self-care tasks.  Pt ambulated with min assist initially progressing to supervision during mobility in room to obtain clothing items and perform toilet and walk-in shower transfer.  Pt completed bathing at sit > stand level with supervision with use of tub transfer bench for safety and energy conservation.  Pt dressed at sit > stand level from edge of bed as she reports that is where she will most likely dress at home.  Grooming completed in standing at sink with close supervision.  Pt left seated in recliner awaiting PT session.  2) Engaged in therapeutic activity with focus on home management tasks and functional mobility in community environment.  Discussed laundry goal and current routine/setup of laundry task with discussion of various methods of transporting laundry to/from laundry room and suggestions to improve ability to retrieve items from front loading dryer.  Pt transported laundry to unit washing machine with RW and supervision and loaded clothes into washing machine.  Pt will benefit from use of reacher and reports having a chair in laundry room she can sit in to retrieve clothes from dryer.  Engaged in functional mobility with RW in hospital gift shop while negotiating narrow, crowded aisles with no LOB or bumping in to anything.  Educated on energy conservation strategies in community and increased challenges that present when in community setting.   Upon return to room, pt completed toilet transfer and toileting with RW with supervision.  Therapy Documentation Precautions:  Precautions Precautions: Fall Precaution Comments: swollen joints, increased pain ( feet and ankle) Restrictions Weight Bearing Restrictions: No Pain:  Pt with no c/o pain  See FIM for current functional status  Therapy/Group: Individual Therapy  Simonne Come 10/21/2014, 10:49 AM

## 2014-10-21 NOTE — Progress Notes (Signed)
Physical Therapy Session Note  Patient Details  Name: [name] [name] MRN: [id number] Date of Birth: 1961-07-04  Today's Date: 10/21/2014 PT Individual Time: 5974-1638 PT Individual Time Calculation (min): 60 min   Skilled Therapeutic Interventions/Progress Updates:  Patient in her room at the beginning of the session, agrees to therapy and has no complains of pain. Gait training with RW 1x150 feet 1 x 300 feet with SBA and manual assistance to reduce knee hyperextension.  Stairs 1 x 11 with one rail and HHA with min A and cues for sequencing. Kinetron 2 x 1 min in standing to increase Quad strength and balance with weight shifting.  Reciprocal movement training, alternating marching and UE movement training.  Exercises to increase strength with B LE and incorporate balance: B SLR while holding a 2 lbs ball between ankles 2 x 10, resisted LAQ 2 x10, mini squats with hip abd 2 x10, toe raises 2 x 10. Patient returned to room with all needs within reach.  Therapy Documentation Precautions:  Precautions Precautions: Fall Precaution Comments: swollen joints, increased pain ( feet and ankle) Restrictions Weight Bearing Restrictions: No  See FIM for current functional status  Therapy/Group: Individual Therapy  Guadlupe Spanish 10/21/2014, 10:49 AM

## 2014-10-21 NOTE — Progress Notes (Signed)
53 y.o. female with history of HTN, hyperlipidemia, lupus with mild LLE weakness as well as compliance issues who was admitted on 09/28/14 with acute onset left sided weakness and numbness as well as gaze to the right side. Patient reported being out of BP meds for 3 weeks and BP 238/88 at admission. UDS negative.  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  Carotid dopplers significant for R-40-59% ICA stenosis.  She was treated with IV diuresis for SOB due to fluid overload.  Lisinopril was dicontinued due to worsening of renal status and she was treated with IV bicarb for acidosis. Repeat cardiac echo showed EF 60-70% with AV sclerosis without stenosis.  FEES done yesterday showing mild dysphagia with pentration due to large boluses but no aspiration therefore she was started on regular textures with thin liquids. She continued to have complaints of neck, back and ankle pain as well as issues with fluctuating bouts of lethargy.  Follow up MRI/MRA brain done on 12/03 revealing changes c/w acute/subacute hemorrhage in right parmedian pons and no underlying mass evident. Minimal diffusion abnormality in high frontal lobes bilaterally likely due to calcification and probably meningioma.    Subjective/Complaints: Patient with a fascia. She is able to answer yes no questions by shaking her head.  Objective: Vital Signs: Blood pressure 180/79, pulse 68, temperature 98.2 F (36.8 C), temperature source Oral, resp. rate 17, height 5\' 5"  (1.651 m), weight 155 lb 10.3 oz (70.6 kg), last menstrual period 07/18/2012, SpO2 100 %.   Thin female in no acute distress. She appears older than her documented age of 53 Chest clear to auscultation. Cardiac exam S1 and S2 are regular she does have a 3/6 systolic ejection murmur. Abdominal exam thin, active bowel sounds, soft. Assessment/Plan: 1.  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   2.  DVT Prophylaxis/Anticoagulation: Mechanical:  Antiembolism stockings, knee (TED hose) Bilateral lower extremities Sequential compression devices, below knee Bilateral lower extremities 3. Pain Management: flexeril prn for spasms. rx of connective tissue disorder/rx with prednsione 4. Mood: team to provide ego support as needed. Appears to be in good spirits at present 5. Neuropsych:  6. Skin/Wound Care: ensure appropriate nutrition and skin pressure relief measures 7. Fluids/Electrolytes/Nutrition: encourage PO intake. labwork on admit 8. Acute on chronic renal failure---baseline Cr around 3.3.             -cellcept resumed, prednisone for now 9. Mixed Connective Tissue Disorder/RA-             -pain much better controlled after steroid burst             -outpt rheum follow up             -plaquenil             -prednisone taper to begin next week 10. Likely hemolytic anemia             -continue to monitor blood counts             -immuno-supressive therapy as above 11. Steroid induced hyperglycemia:             -  SSI CBG (last 3)   Recent Labs  10/20/14 1135 10/20/14 1636 10/20/14 2035  GLUCAP 142* 133* 229*   12. Malignant hypertension: 131/160-197/75       blood pressure is variable. We'll continue to follow.  LOS (Days) 4 A FACE TO FACE EVALUATION WAS PERFORMED  Jericha Bryden HENRY 10/21/2014, 7:03 AM

## 2014-10-21 NOTE — Progress Notes (Signed)
Speech Language Pathology Daily Session Note  Patient Details  Name: [name] [name] MRN: [id number] Date of Birth: 1961/08/04  Today's Date: 10/21/2014 SLP Individual Time: 03-1454 SLP Individual Time Calculation (min): 30 min  Short Term Goals: Week 1: SLP Short Term Goal 1 (Week 1): STGs=LTGs due to length of stay  Skilled Therapeutic Interventions: Skilled treatment session focused on cognitive goals. SLP facilitated session by providing supervision question cues for recall of rules to a previously learned task in yesterday's session and for problem solving throughout the mildly complex task. Patient independently requested to use the bathroom and ambulated with the RW with supervision. Patient left in recliner with family member present. Continue with current plan of care.    FIM:  Comprehension Comprehension: 6-Follows complex conversation/direction: With extra time/assistive device Expression Expression: 6-Expresses complex ideas: With extra time/assistive device Social Interaction Social Interaction: 6-Interacts appropriately with others with medication or extra time (anti-anxiety, antidepressant). Problem Solving Problem Solving: 5-Solves complex 90% of the time/cues < 10% of the time Memory Memory: 5-Recognizes or recalls 90% of the time/requires cueing < 10% of the time  Pain Pain Assessment Pain Assessment: No/denies pain  Therapy/Group: Individual Therapy  Particia Strahm 10/21/2014, 3:00 PM

## 2014-10-22 ENCOUNTER — Inpatient Hospital Stay (HOSPITAL_COMMUNITY): Payer: Managed Care, Other (non HMO) | Admitting: *Deleted

## 2014-10-22 LAB — GLUCOSE, CAPILLARY
GLUCOSE-CAPILLARY: 318 mg/dL — AB (ref 70–99)
Glucose-Capillary: 136 mg/dL — ABNORMAL HIGH (ref 70–99)
Glucose-Capillary: 128 mg/dL — ABNORMAL HIGH (ref 70–99)
Glucose-Capillary: 132 mg/dL — ABNORMAL HIGH (ref 70–99)

## 2014-10-22 MED ORDER — HYDRALAZINE HCL 25 MG PO TABS
25.0000 mg | ORAL_TABLET | Freq: Three times a day (TID) | ORAL | Status: DC
  Administered 2014-10-22 – 2014-10-25 (×9): 25 mg via ORAL
  Filled 2014-10-22 (×13): qty 1

## 2014-10-22 MED ORDER — HYDROCORTISONE 2.5 % RE CREA
TOPICAL_CREAM | Freq: Two times a day (BID) | RECTAL | Status: DC
  Administered 2014-10-23: 10:00:00 via RECTAL
  Administered 2014-10-23: 1 via RECTAL
  Administered 2014-10-24 (×2): via RECTAL
  Filled 2014-10-22: qty 28.35

## 2014-10-22 NOTE — Progress Notes (Signed)
53 y.o. female with history of HTN, hyperlipidemia, lupus with mild LLE weakness as well as compliance issues who was admitted on 09/28/14 with acute onset left sided weakness and numbness as well as gaze to the right side. Patient reported being out of BP meds for 3 weeks and BP 238/88 at admission. UDS negative.  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  Carotid dopplers significant for R-40-59% ICA stenosis.  She was treated with IV diuresis for SOB due to fluid overload.  Lisinopril was dicontinued due to worsening of renal status and she was treated with IV bicarb for acidosis. Repeat cardiac echo showed EF 60-70% with AV sclerosis without stenosis.  FEES done yesterday showing mild dysphagia with pentration due to large boluses but no aspiration therefore she was started on regular textures with thin liquids. She continued to have complaints of neck, back and ankle pain as well as issues with fluctuating bouts of lethargy.  Follow up MRI/MRA brain done on 12/03 revealing changes c/w acute/subacute hemorrhage in right parmedian pons and no underlying mass evident. Minimal diffusion abnormality in high frontal lobes bilaterally likely due to calcification and probably meningioma.   (As recorded in history and physical.) Subjective/Complaints:  Patient has no significant complaints. Denies pain.  Objective: Vital Signs: Blood pressure 172/78, pulse 78, temperature 98.2 F (36.8 C), temperature source Oral, resp. rate 18, height 5\' 5"  (1.651 m), weight 155 lb 10.3 oz (70.6 kg), last menstrual period 07/18/2012, SpO2 98 %.   Thin female in no acute distress. She appears older than her documented age of 48 Chest clear to auscultation. Cardiac exam S1 and S2 are regular she does have a 3/6 systolic ejection murmur. Abdominal exam thin, active bowel sounds, soft. Extremities no  edema. Assessment/Plan: 1. 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   2.  DVT Prophylaxis/Anticoagulation: Mechanical:  Antiembolism stockings, knee (TED hose) Bilateral lower extremities Sequential compression devices, below knee Bilateral lower extremities 3. Pain Management: flexeril prn for spasms. rx of connective tissue disorder/rx with prednsione 4. Mood: team to provide ego support as needed. Appears to be in good spirits at present 5. Neuropsych:  6. Skin/Wound Care: ensure appropriate nutrition and skin pressure relief measures 7. Fluids/Electrolytes/Nutrition: encourage PO intake. labwork on admit 8. Acute on chronic renal failure---baseline Cr around 3.3.             -cellcept resumed, prednisone for now 9. Mixed Connective Tissue Disorder/RA-             -pain much better controlled after steroid burst             -outpt rheum follow up             -plaquenil             -prednisone taper to begin next week 10. Likely hemolytic anemia             -continue to monitor blood counts             -immuno-supressive therapy as above 11. Steroid induced hyperglycemia:             -  SSI CBG (last 3)   Recent Labs  10/21/14 1624 10/21/14 2106 10/22/14 0724  GLUCAP 235* 218* 136*   12. Malignant hypertension:   blood pressure is not controlled.Burnis Medin continue to follow. Will add hydralazine today. LOS (Days) 5 A FACE TO FACE EVALUATION WAS PERFORMED  SWORDS,BRUCE HENRY 10/22/2014, 9:13 AM

## 2014-10-22 NOTE — Progress Notes (Signed)
Physical Therapy Session Note  Patient Details  Name: [name] [name] MRN: [id number] Date of Birth: 08-24-1961  Today's Date: 10/22/2014 PT Individual Time: 8101-7510 PT Individual Time Calculation (min): 45 min     Skilled Therapeutic Interventions/Progress Updates:  Patient in bed at the beginning of the session, supervision for dressing lower body and donning compression stockings. Patient needs increased time to do dressing. Gait training with manual reduction of Knee hyperextension. NuStep 5 min , just LE to increase Quad strength and motor control. Eccentric exercises to increase motor control and strength in L Quad. Weighted exercises to increase strength 5 lbs on ankles for Hamstring curls and quad sets. Patient returned to room , all needs within reach.  Therapy Documentation Precautions:  Precautions Precautions: Fall Precaution Comments: swollen joints, increased pain ( feet and ankle) Restrictions Weight Bearing Restrictions: No Vital Signs: Therapy Vitals Temp: 98.2 F (36.8 C) Temp Source: Oral Resp: 18 Patient Position (if appropriate): Orthostatic Vitals Oxygen Therapy SpO2: 98 %   See FIM for current functional status  Therapy/Group: Individual Therapy  Guadlupe Spanish 10/22/2014, 9:33 AM

## 2014-10-23 ENCOUNTER — Inpatient Hospital Stay (HOSPITAL_COMMUNITY): Payer: Managed Care, Other (non HMO) | Admitting: Rehabilitation

## 2014-10-23 ENCOUNTER — Inpatient Hospital Stay (HOSPITAL_COMMUNITY): Payer: Managed Care, Other (non HMO) | Admitting: Occupational Therapy

## 2014-10-23 ENCOUNTER — Inpatient Hospital Stay (HOSPITAL_COMMUNITY): Payer: Managed Care, Other (non HMO) | Admitting: Speech Pathology

## 2014-10-23 LAB — GLUCOSE, CAPILLARY
GLUCOSE-CAPILLARY: 129 mg/dL — AB (ref 70–99)
GLUCOSE-CAPILLARY: 349 mg/dL — AB (ref 70–99)
GLUCOSE-CAPILLARY: 263 mg/dL — AB (ref 70–99)
Glucose-Capillary: 62 mg/dL — ABNORMAL LOW (ref 70–99)
Glucose-Capillary: 91 mg/dL (ref 70–99)

## 2014-10-23 NOTE — Progress Notes (Signed)
Orthopedic Tech Progress Note Patient Details:  [name] [name] May 12, 1961 [id number]  Patient ID: [name] [name], female   DOB: [birth year], 53 y.o.   MRN: [id number] Called in [facility] brace order; spoke with Theodosia Paling, Jaeven Wanzer 10/23/2014, 9:05 AM

## 2014-10-23 NOTE — Progress Notes (Signed)
Hypoglycemic Event  CBG: 62 Treatment: 15 GM carbohydrate snack Patient eating lunch Symptoms: None  Follow-up CBG: Time:1250  CBG Result:91  Possible Reasons for Event: Unknown  Comments/MD notified:P. Love, PA    Milus Mallick  Remember to initiate Hypoglycemia Order Set & complete

## 2014-10-23 NOTE — Progress Notes (Signed)
Physical Therapy Session Note  Patient Details  Name: [name] [name] MRN: [id number] Date of Birth: February 15, 1961  Today's Date: 10/23/2014 PT Individual Time: 0830-0930 PT Individual Time Calculation (min): 60 min   Short Term Goals: Week 1:  PT Short Term Goal 1 (Week 1): =LTG's due to ELOS  Skilled Therapeutic Interventions/Progress Updates:   Pt received lying in bed, agreeable to therapy session.  Donned and tied shoes at EOB at mod I level.  Pt requesting to use restroom prior to leaving room.  Pt able to ambulate to/from restroom with RW and perform all toileting tasks (adjusting clothing and peri care) at S level.  Discussed making pt mod I in room tomorrow as she continues to progress towards D/C.  Pt also washed/dried hands at S level.  Ambulated to/from therapy gym with RW at S level.  Continue to note mild L knee hyperextension during stance phase, therefore during session donned L ace wrap to knee only in slightly flexed position and this seemed to assist throughout session for >200' gait in controlled environment.  Performed seated nustep x 7 mins at level 4 resistance with LEs only for increased proprioceptive feedback and NMR through LLE.  Tolerated well with single rest break.  Ambulated to ortho gym in order to perform car transfer.  Did so at S level for management of RW.  Ambulated back to therapy gym and performed stairs x 4, 6" steps with single R rail at S level to simulate home entry.  Ended session with balance and LUE coordination activity.  Stood on balance beam for hip strategy while placing clothes pins with LUE to pole on the L for increased WB and weight shift to LLE.   Progressed to tossing horse shoes while in airdex pad and then retrieving from floor with LUE.  Tolerated well and ambulated back to room.  Notified PA to order knee brace and this PT notified [facility] for proper brace.  Pt left in recliner in room with all needs in reach.   Therapy  Documentation Precautions:  Precautions Precautions: Fall Precaution Comments: swollen joints, increased pain ( feet and ankle) Restrictions Weight Bearing Restrictions: No  Pain: pt with no c/o pain during session.    Locomotion : Ambulation Ambulation/Gait Assistance: 5: Supervision   See FIM for current functional status  Therapy/Group: Individual Therapy  Denice Bors 10/23/2014, 11:44 AM

## 2014-10-23 NOTE — Progress Notes (Signed)
53 y.o. female with history of HTN, hyperlipidemia, lupus with mild LLE weakness as well as compliance issues who was admitted on 09/28/14 with acute onset left sided weakness and numbness as well as gaze to the right side. Patient reported being out of BP meds for 3 weeks and BP 238/88 at admission. UDS negative.  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  Carotid dopplers significant for R-40-59% ICA stenosis.  She was treated with IV diuresis for SOB due to fluid overload.  Lisinopril was dicontinued due to worsening of renal status and she was treated with IV bicarb for acidosis. Repeat cardiac echo showed EF 60-70% with AV sclerosis without stenosis.  FEES done yesterday showing mild dysphagia with pentration due to large boluses but no aspiration therefore she was started on regular textures with thin liquids. She continued to have complaints of neck, back and ankle pain as well as issues with fluctuating bouts of lethargy.  Follow up MRI/MRA brain done on 12/03 revealing changes c/w acute/subacute hemorrhage in right parmedian pons and no underlying mass evident. Minimal diffusion abnormality in high frontal lobes bilaterally likely due to calcification and probably meningioma.    Subjective/Complaints: No joint pain , muscles feel a little achy coming off of prednisone, she notices this usually during a taper No abdominal discomfort, no breathing problems no bladder problems  Review of Systems - Negative except as above  Objective: Vital Signs: Blood pressure 165/69, pulse 81, temperature 98.1 F (36.7 C), temperature source Oral, resp. rate 19, height 5\' 5"  (1.651 m), weight 66.4 kg (146 lb 6.2 oz), last menstrual period 07/18/2012, SpO2 99 %. No results found. Results for orders placed or performed during the hospital encounter of 10/17/14 (from the past 72 hour(s))   Glucose, capillary     Status: Abnormal   Collection Time: 10/20/14 11:35 AM  Result Value Ref Range   Glucose-Capillary 142 (H) 70 - 99 mg/dL  Glucose, capillary     Status: Abnormal   Collection Time: 10/20/14  4:36 PM  Result Value Ref Range   Glucose-Capillary 133 (H) 70 - 99 mg/dL  Glucose, capillary     Status: Abnormal   Collection Time: 10/20/14  8:35 PM  Result Value Ref Range   Glucose-Capillary 229 (H) 70 - 99 mg/dL  Glucose, capillary     Status: Abnormal   Collection Time: 10/21/14  7:21 AM  Result Value Ref Range   Glucose-Capillary 135 (H) 70 - 99 mg/dL  Glucose, capillary     Status: None   Collection Time: 10/21/14 11:44 AM  Result Value Ref Range   Glucose-Capillary 84 70 - 99 mg/dL  Glucose, capillary     Status: Abnormal   Collection Time: 10/21/14  4:24 PM  Result Value Ref Range   Glucose-Capillary 235 (H) 70 - 99 mg/dL   Comment 1 Notify RN   Glucose, capillary     Status: Abnormal   Collection Time: 10/21/14  9:06 PM  Result Value Ref Range   Glucose-Capillary 218 (H) 70 - 99 mg/dL  Glucose, capillary     Status: Abnormal   Collection Time: 10/22/14  7:24 AM  Result Value Ref Range   Glucose-Capillary 136 (H) 70 - 99 mg/dL  Glucose, capillary     Status: Abnormal   Collection Time: 10/22/14 11:30 AM  Result Value Ref Range   Glucose-Capillary 128 (H) 70 -  99 mg/dL   Comment 1 Documented in Chart   Glucose, capillary     Status: Abnormal   Collection Time: 10/22/14  4:39 PM  Result Value Ref Range   Glucose-Capillary 318 (H) 70 - 99 mg/dL  Glucose, capillary     Status: Abnormal   Collection Time: 10/22/14  8:56 PM  Result Value Ref Range   Glucose-Capillary 132 (H) 70 - 99 mg/dL  Glucose, capillary     Status: Abnormal   Collection Time: 10/23/14  7:00 AM  Result Value Ref Range   Glucose-Capillary 129 (H) 70 - 99 mg/dL     HEENT: normal Cardio: RRR and no murmur Resp: CTA B/L and unlabored GI: BS positive and NT, mild distension,  +tympani Extremity:  Pulses positive and No Edema Skin:   Intact Neuro: Alert/Oriented, Normal Sensory, Abnormal Motor 5 minus/5 in the left deltoid, biceps, triceps, grip, hip flexor, knee extensor, ankle dorsal flexor plantar flexor and Abnormal FMC Ataxic/ dec FMC Musc/Skel:  Other no joint tenderness in the knees hands and feet, full active range of motion without pain Gen. no acute distress   Assessment/Plan: 1. 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 Physiatrist is providing close team supervision and 24 hour management of active medical problems listed below. Physiatrist and rehab team continue to assess barriers to discharge/monitor patient progress toward functional and medical goals. FIM: FIM - Bathing Bathing Steps Patient Completed: Chest, Right Arm, Left Arm, Abdomen, Front perineal area, Buttocks, Right upper leg, Left upper leg, Right lower leg (including foot), Left lower leg (including foot) Bathing: 5: Supervision: Safety issues/verbal cues  FIM - Upper Body Dressing/Undressing Upper body dressing/undressing steps patient completed: Thread/unthread left bra strap, Thread/unthread right bra strap, Hook/unhook bra, Thread/unthread right sleeve of pullover shirt/dresss, Thread/unthread left sleeve of pullover shirt/dress, Put head through opening of pull over shirt/dress, Pull shirt over trunk Upper body dressing/undressing: 5: Supervision: Safety issues/verbal cues FIM - Lower Body Dressing/Undressing Lower body dressing/undressing steps patient completed: Thread/unthread right underwear leg, Thread/unthread left underwear leg, Pull underwear up/down, Thread/unthread right pants leg, Thread/unthread left pants leg, Pull pants up/down, Don/Doff right shoe, Don/Doff  left shoe, Fasten/unfasten right shoe, Fasten/unfasten left shoe Lower body dressing/undressing: 5: Set-up assist to: Don/Doff TED stocking  FIM - Toileting Toileting steps completed by patient: Adjust clothing prior to toileting, Performs perineal hygiene, Adjust clothing after toileting Toileting Assistive Devices: Grab bar or rail for support Toileting: 5: Supervision: Safety issues/verbal cues  FIM - Radio producer Devices: Insurance account manager Transfers: 5-To toilet/BSC: Supervision (verbal cues/safety issues), 5-From toilet/BSC: Supervision (verbal cues/safety issues)  FIM - Control and instrumentation engineer Devices: Walker (Simultaneous filing. User may not have seen previous data.) Bed/Chair Transfer: 5: Supine > Sit: Supervision (verbal cues/safety issues), 4: Bed > Chair or W/C: Min A (steadying Pt. > 75%), 4: Chair or W/C > Bed: Min A (steadying Pt. > 75%) (Simultaneous filing. User may not have seen previous data.)  FIM - Locomotion: Wheelchair Distance: 150 Locomotion: Wheelchair: 0: Activity did not occur FIM - Locomotion: Ambulation Locomotion: Ambulation Assistive Devices: Administrator Ambulation/Gait Assistance: 5: Supervision Locomotion: Ambulation: 5: Travels 150 ft or more with supervision/safety issues  Comprehension Comprehension Mode: Auditory Comprehension: 6-Follows complex conversation/direction: With extra time/assistive device  Expression Expression Mode: Verbal Expression: 6-Expresses complex ideas: With  extra time/assistive device  Social Interaction Social Interaction: 6-Interacts appropriately with others with medication or extra time (anti-anxiety, antidepressant).  Problem Solving Problem Solving: 5-Solves complex 90% of the time/cues < 10% of the time  Memory Memory: 5-Recognizes or recalls 90% of the time/requires cueing < 10% of the time  Medical Problem List and Plan: 1. Functional deficits  secondary to recent right pontine hemorrhage and further deconditioning after acute blood loss and flare of mixed connective tissue disease/RA.   2.  DVT Prophylaxis/Anticoagulation: Mechanical:  Antiembolism stockings, knee (TED hose) Bilateral lower extremities Sequential compression devices, below knee Bilateral lower extremities 3. Pain Management: flexeril prn for spasms. rx of connective tissue disorder/rx with prednsione 4. Mood: team to provide ego support as needed. Appears to be in good spirits at present 5. Neuropsych: This patient is capable of making decisions on her own behalf. 6. Skin/Wound Care: ensure appropriate nutrition and skin pressure relief measures 7. Fluids/Electrolytes/Nutrition: encourage PO intake. labwork on admit 8. Acute on chronic renal failure---baseline Cr around 3.3.             -cellcept resumed, prednisone for now 9. Mixed Connective Tissue Disorder/RA-             -pain much better controlled after steroid burst             -outpt rheum follow up             -plaquenil             -prednisone taper , monitor for recurrence of symptoms 10. Likely hemolytic anemia             -continue to monitor blood counts             -immuno-supressive therapy as above 11. Steroid induced hyperglycemia:             -SSI 12. Malignant hypertension:  mainly systolic elevation at this point             -lasix, catapres, labetalol, imdur             -further adjustments, increased catapres  LOS (Days) 6 A FACE TO FACE EVALUATION WAS PERFORMED  KIRSTEINS,ANDREW E 10/23/2014, 10:34 AM

## 2014-10-23 NOTE — Progress Notes (Signed)
Social Work Patient ID: [name] [name], female   DOB: [birth year], 53 y.o.   MRN: [id number] Met with pt to discuss follow up therapies, she prefers home health due to transportation issues and co-pays high for OP.  Can transition to OP once only requires one therapy follow up. Pt is pleased with her progress and is looking forward to going home.

## 2014-10-23 NOTE — Progress Notes (Signed)
Speech Language Pathology Daily Session Note  Patient Details  Name: [name] [name] MRN: [id number] Date of Birth: 1961/05/10  Today's Date: 10/23/2014 SLP Individual Time: 0930-1030 SLP Individual Time Calculation (min): 60 min  Short Term Goals: Week 1: SLP Short Term Goal 1 (Week 1): STGs=LTGs due to length of stay  Skilled Therapeutic Interventions: Skilled treatment session focused on addressing cognition goals.  SLP facilitated session with moderately complex functional math calculations with 5/5 accuracy doing calculations in her head but after question cues to utilize a self-check strategy patient was able to identify and correct errors with 10/10 accuracy at the end of the task.  SLP discussed effective compensatory strategies for problem solving and memory.  Continue with current plan of care.    FIM:  Comprehension Comprehension Mode: Auditory Comprehension: 6-Follows complex conversation/direction: With extra time/assistive device Expression Expression Mode: Verbal Expression: 6-Expresses complex ideas: With extra time/assistive device Social Interaction Social Interaction: 6-Interacts appropriately with others with medication or extra time (anti-anxiety, antidepressant). Problem Solving Problem Solving: 5-Solves complex 90% of the time/cues < 10% of the time Memory Memory: 5-Recognizes or recalls 90% of the time/requires cueing < 10% of the time  Pain Pain Assessment Pain Assessment: No/denies pain  Therapy/Group: Individual Therapy  Carmelia Roller., [name] [phone]  [place] 10/23/2014, 12:16 PM

## 2014-10-23 NOTE — Progress Notes (Signed)
Occupational Therapy Session Note  Patient Details  Name: [name] [name] MRN: [id number] Date of Birth: Mar 04, 1961  Today's Date: 10/23/2014 OT Individual Time: 1110-1210 OT Individual Time Calculation (min): 60 min    Short Term Goals: Week 1:  OT Short Term Goal 1 (Week 1): STGs= LTGs secondary to estimated short LOS  Skilled Therapeutic Interventions/Progress Updates:    Engaged in ADL retraining with focus on dynamic standing balance, functional mobility and tranfers, and increased independence with self-care tasks.  Pt ambulated with RW with distant supervision while gathering clothing and necessary items prior to bathing and dressing. Pt Mod I with ambulation into bathroom and to toilet with RW.  Bathing completed at sit > stand level with pt recalling need to sit for increased safety with bathing.  Pt dressed at sit > stand level from EOB, reporting she will most likely dress there upon return home.  Had pt retrieve towels and items from floor at end of session with no LOB. Discussed simple meal prep goal with plan to address during session tomorrow.  Therapy Documentation Precautions:  Precautions Precautions: Fall Precaution Comments: swollen joints, increased pain ( feet and ankle) Restrictions Weight Bearing Restrictions: No General:   Vital Signs: Therapy Vitals Temp: 97.7 F (36.5 C) Temp Source: Oral Pulse Rate: 76 Resp: 18 BP: (!) 154/65 mmHg Patient Position (if appropriate): Lying Oxygen Therapy SpO2: 100 % O2 Device: Not Delivered Pain: Pain Assessment Pain Assessment: No/denies pain  See FIM for current functional status  Therapy/Group: Individual Therapy  Simonne Come 10/23/2014, 3:26 PM

## 2014-10-24 ENCOUNTER — Inpatient Hospital Stay (HOSPITAL_COMMUNITY): Payer: Managed Care, Other (non HMO) | Admitting: Occupational Therapy

## 2014-10-24 ENCOUNTER — Inpatient Hospital Stay (HOSPITAL_COMMUNITY): Payer: Managed Care, Other (non HMO) | Admitting: Speech Pathology

## 2014-10-24 ENCOUNTER — Inpatient Hospital Stay (HOSPITAL_COMMUNITY): Payer: Managed Care, Other (non HMO) | Admitting: Rehabilitation

## 2014-10-24 ENCOUNTER — Inpatient Hospital Stay (HOSPITAL_COMMUNITY): Payer: Managed Care, Other (non HMO)

## 2014-10-24 LAB — GLUCOSE, CAPILLARY
GLUCOSE-CAPILLARY: 125 mg/dL — AB (ref 70–99)
GLUCOSE-CAPILLARY: 103 mg/dL — AB (ref 70–99)
GLUCOSE-CAPILLARY: 198 mg/dL — AB (ref 70–99)
Glucose-Capillary: 286 mg/dL — ABNORMAL HIGH (ref 70–99)

## 2014-10-24 MED ORDER — CLONIDINE HCL 0.3 MG PO TABS
0.3000 mg | ORAL_TABLET | Freq: Three times a day (TID) | ORAL | Status: DC
  Administered 2014-10-24 – 2014-10-25 (×3): 0.3 mg via ORAL
  Filled 2014-10-24 (×6): qty 1

## 2014-10-24 NOTE — Plan of Care (Signed)
Problem: RH Wheelchair Mobility Goal: LTG Patient will propel w/c in controlled environment (PT) LTG: Patient will propel wheelchair in controlled environment, # of feet with assist (PT)  Outcome: Not Applicable Date Met:  29/52/84 Pt ambulatory at D/C, goal is N/A.

## 2014-10-24 NOTE — Progress Notes (Signed)
Subjective/Complaints: Pulling sensation Left ribs Ok for Mod I in room No abdominal discomfort, no breathing problems no bladder problems  Review of Systems - Negative except as above  Objective: Vital Signs: Blood pressure 182/66, pulse 80, temperature 98 F (36.7 C), temperature source Oral, resp. rate 20, height 5\' 5"  (1.651 m), weight 66.2 kg (145 lb 15.1 oz), last menstrual period 07/18/2012, SpO2 100 %. No results found. Results for orders placed or performed during the hospital encounter of 10/17/14 (from the past 72 hour(s))  Glucose, capillary     Status: None   Collection Time: 10/21/14 11:44 AM  Result Value Ref Range   Glucose-Capillary 84 70 - 99 mg/dL  Glucose, capillary     Status: Abnormal   Collection Time: 10/21/14  4:24 PM  Result Value Ref Range   Glucose-Capillary 235 (H) 70 - 99 mg/dL   Comment 1 Notify RN   Glucose, capillary     Status: Abnormal   Collection Time: 10/21/14  9:06 PM  Result Value Ref Range   Glucose-Capillary 218 (H) 70 - 99 mg/dL  Glucose, capillary     Status: Abnormal   Collection Time: 10/22/14  7:24 AM  Result Value Ref Range   Glucose-Capillary 136 (H) 70 - 99 mg/dL  Glucose, capillary     Status: Abnormal   Collection Time: 10/22/14 11:30 AM  Result Value Ref Range   Glucose-Capillary 128 (H) 70 - 99 mg/dL   Comment 1 Documented in Chart   Glucose, capillary     Status: Abnormal   Collection Time: 10/22/14  4:39 PM  Result Value Ref Range   Glucose-Capillary 318 (H) 70 - 99 mg/dL  Glucose, capillary     Status: Abnormal   Collection Time: 10/22/14  8:56 PM  Result Value Ref Range   Glucose-Capillary 132 (H) 70 - 99 mg/dL  Glucose, capillary     Status: Abnormal   Collection Time: 10/23/14  7:00 AM  Result Value Ref Range   Glucose-Capillary 129 (H) 70 - 99 mg/dL  Glucose, capillary     Status: Abnormal   Collection Time: 10/23/14 12:21 PM  Result Value Ref Range   Glucose-Capillary 62 (L) 70 - 99 mg/dL  Glucose,  capillary     Status: None   Collection Time: 10/23/14 12:54 PM  Result Value Ref Range   Glucose-Capillary 91 70 - 99 mg/dL  Glucose, capillary     Status: Abnormal   Collection Time: 10/23/14  4:33 PM  Result Value Ref Range   Glucose-Capillary 349 (H) 70 - 99 mg/dL  Glucose, capillary     Status: Abnormal   Collection Time: 10/23/14  8:33 PM  Result Value Ref Range   Glucose-Capillary 263 (H) 70 - 99 mg/dL  Glucose, capillary     Status: Abnormal   Collection Time: 10/24/14  6:26 AM  Result Value Ref Range   Glucose-Capillary 125 (H) 70 - 99 mg/dL     HEENT: normal Cardio: RRR and no murmur Resp: CTA B/L and unlabored GI: BS positive and NT, mild distension, +tympani Extremity:  Pulses positive and No Edema Skin:   Intact Neuro: Alert/Oriented, Normal Sensory, Abnormal Motor 5 minus/5 in the left deltoid, biceps, triceps, grip, hip flexor, knee extensor, ankle dorsal flexor plantar flexor and Abnormal FMC Ataxic/ dec FMC Musc/Skel:  Other no joint tenderness in the knees hands and feet, full active range of motion without pain Gen. no acute distress   Assessment/Plan: 1. 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 Physiatrist is providing close team supervision and 24 hour management of active medical problems listed below. Physiatrist and rehab team continue to assess barriers to discharge/monitor patient progress toward functional and medical goals. FIM: FIM - Bathing Bathing Steps Patient Completed: Chest, Right Arm, Left Arm, Abdomen, Front perineal area, Buttocks, Right upper leg, Left upper leg, Right lower leg (including foot), Left lower leg (including foot) Bathing: 6: Assistive device (Comment)  FIM - Upper Body Dressing/Undressing Upper body  dressing/undressing steps patient completed: Thread/unthread left bra strap, Thread/unthread right bra strap, Hook/unhook bra, Thread/unthread right sleeve of pullover shirt/dresss, Thread/unthread left sleeve of pullover shirt/dress, Put head through opening of pull over shirt/dress, Pull shirt over trunk Upper body dressing/undressing: 6: More than reasonable amount of time FIM - Lower Body Dressing/Undressing Lower body dressing/undressing steps patient completed: Thread/unthread right underwear leg, Thread/unthread left underwear leg, Pull underwear up/down, Thread/unthread right pants leg, Thread/unthread left pants leg, Pull pants up/down, Don/Doff right shoe, Don/Doff left shoe, Fasten/unfasten right shoe, Fasten/unfasten left shoe, Don/Doff right sock, Don/Doff left sock Lower body dressing/undressing: 6: More than reasonable amount of time  FIM - Toileting Toileting steps completed by patient: Adjust clothing prior to toileting, Performs perineal hygiene, Adjust clothing after toileting Toileting Assistive Devices: Grab bar or rail for support Toileting: 6: Assistive device: No helper  FIM - Radio producer Devices: Insurance account manager Transfers: 6-To toilet/ BSC, 6-From toilet/BSC, 6-Assistive device: No helper  FIM - Control and instrumentation engineer Devices: Walker (Simultaneous filing. User may not have seen previous data.) Bed/Chair Transfer: 5: Supine > Sit: Supervision (verbal cues/safety issues), 4: Bed > Chair or W/C: Min A (steadying Pt. > 75%), 4: Chair or W/C > Bed: Min A (steadying Pt. > 75%) (Simultaneous filing. User may not have seen previous data.)  FIM - Locomotion: Wheelchair Distance: 150 Locomotion: Wheelchair: 0: Activity did not occur FIM - Locomotion: Ambulation Locomotion: Ambulation Assistive Devices: Administrator Ambulation/Gait Assistance: 5: Supervision Locomotion: Ambulation: 5: Travels 150 ft or more with  supervision/safety issues  Comprehension Comprehension Mode: Auditory Comprehension: 6-Follows complex conversation/direction: With extra time/assistive device  Expression Expression Mode: Verbal Expression: 6-Expresses complex ideas: With extra time/assistive device  Social Interaction Social Interaction: 6-Interacts appropriately with others with medication or extra time (anti-anxiety, antidepressant).  Problem Solving Problem Solving: 6-Solves complex problems: With extra time  Memory Memory: 6-More than reasonable amt of time  Medical Problem List and Plan: 1. 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   2.  DVT Prophylaxis/Anticoagulation: Mechanical:  Antiembolism stockings, knee (TED hose) Bilateral lower extremities Sequential compression devices, below knee Bilateral lower extremities 3. Pain Management: flexeril prn for spasms. rx of connective tissue disorder/rx with prednsione 4. Mood: team to provide ego support as needed. Appears to be in good spirits at present 5. Neuropsych: This patient is capable of making decisions on her own behalf. 6. Skin/Wound Care: ensure appropriate nutrition and skin pressure relief measures 7. Fluids/Electrolytes/Nutrition: encourage PO intake. labwork on admit 8. Acute on chronic renal failure---baseline Cr around 3.3.             -cellcept resumed, prednisone for now 9. Mixed Connective Tissue Disorder/RA-             -  pain much better controlled after steroid burst             -outpt rheum follow up             -plaquenil             -prednisone taper , monitor for recurrence of symptoms 10. Likely hemolytic anemia             -continue to monitor blood counts             -immuno-supressive therapy as above 11. Steroid induced hyperglycemia:             -SSI 12. Malignant hypertension:  mainly systolic elevation  at this point             -lasix, catapres, labetalol, imdur, add norvasc             -further adjustments, increased catapres  LOS (Days) 7 A FACE TO FACE EVALUATION WAS PERFORMED  KIRSTEINS,ANDREW E 10/24/2014, 9:30 AM

## 2014-10-24 NOTE — Progress Notes (Signed)
Occupational Therapy Discharge Summary  Patient Details  Name: [name] [name] MRN: [id number] Date of Birth: 1961/07/11  Today's Date: 10/24/2014   Patient has met 10 of 10 long term goals due to improved activity tolerance, improved balance, postural control, ability to compensate for deficits, improved attention and improved awareness.  Patient to discharge at overall Modified Independent level.  Patient's care partner is independent to provide the necessary assistance at discharge.  Pt is modified overall and no family have been present for formal education with OT.  Reasons goals not met: NA  Recommendation:  Patient will benefit from ongoing skilled OT services in home health setting to continue to advance functional skills in the area of iADL and Reduce care partner burden.  Equipment: No equipment provided  Reasons for discharge: treatment goals met and discharge from hospital  Patient/family agrees with progress made and goals achieved: Yes  OT Discharge Precautions/Restrictions  Precautions Precautions: Fall Precaution Comments: swollen joints, increased pain ( feet and ankle) Restrictions Weight Bearing Restrictions: No General   Vital Signs Therapy Vitals Temp: 97.8 F (36.6 C) Temp Source: Oral Pulse Rate: 69 Resp: 20 BP: (!) 144/61 mmHg Patient Position (if appropriate): Sitting Oxygen Therapy SpO2: 100 % O2 Device: Not Delivered Pain Pain Assessment Pain Assessment: No/denies pain Pain Score: 0-No pain ADL   Vision/Perception  Vision- History Baseline Vision/History: Wears glasses Wears Glasses: At all times  Cognition Overall Cognitive Status: Impaired/Different from baseline Arousal/Alertness: Awake/alert Orientation Level: Oriented X4 Attention: Alternating Sustained Attention: Appears intact Selective Attention: Appears intact Alternating Attention: Impaired Alternating Attention Impairment: Functional basic Memory: Appears  intact Awareness: Appears intact Safety/Judgment: Appears intact Comments: pt Mod I in room Sensation Sensation Light Touch: Impaired Detail Light Touch Impaired Details: Impaired LUE Stereognosis: Not tested Hot/Cold: Not tested Hot/Cold Impaired Details: Impaired LLE;Impaired LUE Proprioception: Appears Intact Coordination 9 Hole Peg Test: Lt: 1:40 and Rt: 40 seconds Motor  Motor Motor: Hemiplegia Motor - Discharge Observations: Pt with slightly decreased strength in LLE/UE vs R, decreased balance.  Mobility  Bed Mobility Bed Mobility: Supine to Sit;Sit to Supine Supine to Sit: 6: Modified independent (Device/Increase time) Sit to Supine: 6: Modified independent (Device/Increase time) Transfers Sit to Stand: 6: Modified independent (Device/Increase time) Stand to Sit: 6: Modified independent (Device/Increase time)  Trunk/Postural Assessment  Cervical Assessment Cervical Assessment: Within Functional Limits Thoracic Assessment Thoracic Assessment: Within Functional Limits Lumbar Assessment Lumbar Assessment: Within Functional Limits Postural Control Postural Control: Within Functional Limits Protective Responses: delayed and tends to lose balance to the R with decreased stepping strategy noted.   Balance   Extremity/Trunk Assessment RUE Assessment RUE Assessment: Within Functional Limits LUE Assessment LUE Assessment: Within Functional Limits  See FIM for current functional status  Griff Badley, [facility] 10/24/2014, 3:16 PM

## 2014-10-24 NOTE — Progress Notes (Signed)
Occupational Therapy Session Note  Patient Details  Name: [name] [name] MRN: [id number] Date of Birth: 09-14-1961  Today's Date: 10/24/2014 OT Individual Time: 1100-1200 and 1300-1330 OT Individual Time Calculation (min): 60 min and 30 min   Short Term Goals: Week 1:  OT Short Term Goal 1 (Week 1): STGs= LTGs secondary to estimated short LOS  Skilled Therapeutic Interventions/Progress Updates:    1) Engaged in ADL retraining with focus on dynamic standing balance, functional mobility and transfers, and increased independence with self-care tasks.  Pt gathered all clothing and necessary items prior to bathing and dressing with use of RW at overall Mod I level.  Provided distant supervision with walk-in shower transfer.  Dressing completed at sit > stand level from EOB at Mod I level.  Ambulated to ADL apt to complete simple meal prep task.  Pt retrieved grapes from refrigerator, washed, and cut them for fruit salad with supervision.  Educated on use of counters to transport items.  Pt reports family member purchased a tray for the walker.  Also discussed having family member obtain large, heavy items and bring to counter so she doesn't have to retreive them.  Discussed having supervision for cooking tasks at this time, with pt reporting understanding.    2) Engaged in therapeutic activity with focus on LUE FMC and strength.  Pt ambulated to therapy gym with RW at mod I level.  Completed 9 hole peg test Lt: 1:40 and Rt: 40 seconds.  Pt reports decreased coordination and sensation in Lt fingers, making task difficult.  Provided pt with HEP handout for [facility] tasks and theraputty and had pt demonstrate tasks.  Completed in-hand manipulation, translation, and stacking coins with moderate difficulty with stacking.  Also located pennies in putty with focus on use of Lt hand for strengthening and [facility].  Therapy Documentation Precautions:  Precautions Precautions: Fall Precaution Comments: swollen joints,  increased pain ( feet and ankle) Restrictions Weight Bearing Restrictions: No Pain: Pain Assessment Pain Assessment: No/denies pain Pain Score: 0-No pain  See FIM for current functional status  Therapy/Group: Individual Therapy  Simonne Come 10/24/2014, 12:23 PM

## 2014-10-24 NOTE — Progress Notes (Signed)
Social Work Discharge Note Discharge Note  The overall goal for the admission was met for:   Discharge location: Yes-HOME WITH CHILDREN ASSISTING-SUPERVISION  Length of Stay: Yes-9 DAYS  Discharge activity level: Yes-MOD/I LEVEL  Home/community participation: Yes  Services provided included: MD, RD, PT, OT, SLP, RN, CM, TR, Pharmacy and [place]: Private Insurance: [employer]  Follow-up services arranged: Home Health: [place] [facility]ARE-PT,SP,OT, DME: [facility] and Patient/Family has no preference for HH/DME agencies  Comments (or additional information):FAMILY HERE FOR FAMILY EDUCATION-GRANDDAUGHTER GOT SICK AND THEY LEFT. WRITTEN INFORMATION GIVEN TO DAUGHTER  Patient/Family verbalized understanding of follow-up arrangements: Yes  Individual responsible for coordination of the follow-up plan: SELF & BRITTANY-DAUGHTER  Confirmed correct DME delivered: Elease Hashimoto 10/24/2014    Elease Hashimoto

## 2014-10-24 NOTE — Progress Notes (Signed)
Physical Therapy Discharge Summary  Patient Details  Name: [name] [name] MRN: [id number] Date of Birth: 11/28/1960  Today's Date: 10/24/2014 PT Individual Time: 0830-0930 PT Individual Time Calculation (min): 60 min    Patient has met 9 of 10 long term goals due to improved activity tolerance, improved balance, improved postural control, increased strength, ability to compensate for deficits, improved attention, improved awareness and improved coordination.  Patient to discharge at an ambulatory level Modified Independent.   Patient's care partner available to provide the necessary intermittent S for higher level cognitive tasks at discharge.  Reasons goals not met: pt did not meet w/c goal as she is ambulatory and will not be given a w/c.   Recommendation:  Patient will benefit from ongoing skilled PT services in outpatient setting to continue to advance safe functional mobility, address ongoing impairments in decreased balance, decreased coordination, decreased strength in LUE/LE, and minimize fall risk.  Equipment: RW  Reasons for discharge: treatment goals met and discharge from hospital  Patient/family agrees with progress made and goals achieved: Yes   PT Treatment/Intervention: Pt received lying in bed, getting meds from RN.  Performed bed mobility at mod I level.   Assisted with donning shoes to save time during session.  Pt requesting to use restroom prior to leaving room.  Ambulated in room and managed all toileting tasks at mod I level.  Ambulated to sink to wash/dry hands at mod I level.  Ambulated >200' with RW at mod I level during session in controlled, home and simulated community environment.  Pt with good problem solving tasks to perform side stepping with RW when can't go forwards.  Performed BERG balance test with new score of 42/56, improvement from 29/56 from day of eval.  Discussed that she is still a fall risk which is why we recommend she use RW and continue to get  therapy when she leaves.  Pt verbalized understanding.  Performed 6, 6" steps with R handrail to simulate home entry.  Performed at S level with min single cue for safety.  Ambulated to ADL apt in order to perform bed mobility at mod I level, furniture transfers at mod I level.  Ambulated to ortho gym in order to perform car transfer.  Did so at S level for management of RW.  Assessed strength, coordination, sensation, etc, see full details below.  Ended session with floor recovery.  Educated on when to perform vs when to call 911.  Pt verbalized understanding and return demonstration at S level.  Pt ambulated back to room.  Made pt mod I in room, RN made aware.  Also discussed that she could ambulate to Christmas party this afternoon if she wanted.    PT Discharge Precautions/Restrictions Precautions Precautions: Fall Precaution Comments: swollen joints, increased pain ( feet and ankle) Restrictions Weight Bearing Restrictions: No  Pain Pain Assessment Pain Assessment: No/denies pain Pain Score: 0-No pain Vision/Perception   See OT note Cognition Overall Cognitive Status: Impaired/Different from baseline Arousal/Alertness: Awake/alert Orientation Level: Oriented X4 Attention: Alternating Sustained Attention: Appears intact Selective Attention: Appears intact Alternating Attention: Impaired Alternating Attention Impairment: Functional basic Memory: Appears intact Awareness: Appears intact Safety/Judgment: Appears intact Comments: pt Mod I in room Sensation Sensation Light Touch: Impaired Detail Light Touch Impaired Details: Impaired LUE Stereognosis: Not tested Hot/Cold: Not tested Proprioception: Appears Intact Coordination Gross Motor Movements are Fluid and Coordinated: Yes Fine Motor Movements are Fluid and Coordinated: Yes Coordination and Movement Description: All movements grossly coordinated during testing and   functional tasks in [place].  Heel Shin Test: [facility] Motor   Motor Motor: Hemiplegia Motor - Discharge Observations: Pt with slightly decreased strength in LLE/UE vs R, decreased balance.   Mobility Bed Mobility Bed Mobility: Supine to Sit;Sit to Supine Supine to Sit: 6: Modified independent (Device/Increase time) Sit to Supine: 6: Modified independent (Device/Increase time) Transfers Transfers: Yes Sit to Stand: 6: Modified independent (Device/Increase time) Stand to Sit: 6: Modified independent (Device/Increase time) Stand Pivot Transfers: 6: Modified independent (Device/Increase time) Locomotion  Ambulation Ambulation: Yes Ambulation/Gait Assistance: 6: Modified independent (Device/Increase time) Ambulation Distance (Feet): 300 Feet Assistive device: Rolling walker Gait Gait: Yes Gait Pattern: Impaired Gait Pattern: Decreased weight shift to left;Decreased stride length;Left genu recurvatum Stairs / Additional Locomotion Stairs: Yes Stairs Assistance: 5: Supervision Stairs Assistance Details: Verbal cues for precautions/safety Stair Management Technique: One rail Right Number of Stairs: 6 Height of Stairs: 6 Wheelchair Mobility Wheelchair Mobility: No (pt ambulatory at D/C) Distance:  (pt is ambulatory at D/C)  Trunk/Postural Assessment  Cervical Assessment Cervical Assessment: Within Functional Limits Thoracic Assessment Thoracic Assessment: Within Functional Limits Lumbar Assessment Lumbar Assessment: Within Functional Limits Postural Control Postural Control: Within Functional Limits Protective Responses: delayed and tends to lose balance to the R with decreased stepping strategy noted.   Balance Balance Balance Assessed: Yes Standardized Balance Assessment Standardized Balance Assessment: Berg Balance Test Berg Balance Test Sit to Stand: Able to stand without using hands and stabilize independently Standing Unsupported: Able to stand safely 2 minutes Sitting with Back Unsupported but Feet Supported on Floor or Stool:  Able to sit safely and securely 2 minutes Stand to Sit: Controls descent by using hands Transfers: Able to transfer safely, definite need of hands Standing Unsupported with Eyes Closed: Able to stand 10 seconds with supervision Standing Ubsupported with Feet Together: Able to place feet together independently and stand 1 minute safely From Standing, Reach Forward with Outstretched Arm: Can reach confidently >25 cm (10") From Standing Position, Pick up Object from Floor: Able to pick up shoe safely and easily From Standing Position, Turn to Look Behind Over each Shoulder: Looks behind one side only/other side shows less weight shift Turn 360 Degrees: Able to turn 360 degrees safely but slowly Standing Unsupported, Alternately Place Feet on Step/Stool: Needs assistance to keep from falling or unable to try Standing Unsupported, One Foot in Front: Able to plae foot ahead of the other independently and hold 30 seconds Standing on One Leg: Tries to lift leg/unable to hold 3 seconds but remains standing independently Total Score: 42 Dynamic Sitting Balance Dynamic Sitting - Balance Support: Feet supported;During functional activity Dynamic Sitting - Level of Assistance: 6: Modified independent (Device/Increase time) Static Standing Balance Static Standing - Balance Support: During functional activity Static Standing - Level of Assistance: 6: Modified independent (Device/Increase time) Dynamic Standing Balance Dynamic Standing - Balance Support: During functional activity;Left upper extremity supported Dynamic Standing - Level of Assistance: 6: Modified independent (Device/Increase time) Dynamic Standing - Balance Activities: Lateral lean/weight shifting;Forward lean/weight shifting Extremity Assessment      RLE Assessment RLE Assessment: Within Functional Limits LLE Strength LLE Overall Strength: Deficits LLE Overall Strength Comments: hip flex 3+/5, knee flex 3+/5, otherwise 4/5, still  decreased ROM in L ankle  See FIM for current functional status  Denice Bors 10/24/2014, 11:51 AM

## 2014-10-24 NOTE — Progress Notes (Signed)
Speech Language Pathology Session Note & Discharge Summary  Patient Details  Name: [name] [name] MRN: [id number] Date of Birth: 11-29-1960  Today's Date: 10/24/2014 SLP Individual Time: 1330-1400 SLP Individual Time Calculation (min): 30 min   Skilled Therapeutic Interventions:  Skilled treatment session focused on completion of patient education. SLP provided education in regards to her cognitive function and strategies to utilize at home to increase working memory, problem solving and overall safety. Handouts were also given to reinforce information. Patient's daughter was not present for family education due to her daughter being sick, however, all information was provided in written form and patient will provide the handout to her family. Continue with current plan of care.     Patient has met 3 of 3 long term goals.  Patient to discharge at overall Supervision level.  Reasons goals not met: N/A   Clinical Impression/Discharge Summary: Patient has made functional gains and has met 3 of 3 LTG's this admission due to improved working memory, problem solving and awareness. Currently, patient requires supervision multimodal cues for functional problem solving with mildly complex tasks, recall of new information and anticipatory awareness.  Education is complete and patient will discharge home with intermittent supervision from family. Patient would benefit from f/u SLP services in order to maximize her cognitive function and overall functional independence.   Care Partner:  Caregiver Able to Provide Assistance: Yes  Type of Caregiver Assistance: Physical;Cognitive  Recommendation:  Outpatient SLP;Other (comment) (Intermittent supervision)  Rationale for SLP Follow Up: Maximize cognitive function and independence;Reduce caregiver burden   Equipment: N/A   Reasons for discharge: Treatment goals met;Discharged from hospital   Patient/Family Agrees with Progress Made and Goals Achieved:  Yes   See FIM for current functional status  Mavin Dyke 10/24/2014, 5:19 PM

## 2014-10-24 NOTE — Plan of Care (Signed)
Problem: RH BOWEL ELIMINATION Goal: RH STG MANAGE BOWEL W/MEDICATION W/ASSISTANCE STG Manage Bowel with Medication with [place].  Outcome: Progressing LBM 10/23/2014  Problem: RH SKIN INTEGRITY Goal: RH STG ABLE TO PERFORM INCISION/WOUND CARE W/ASSISTANCE STG Able To Perform Incision/Wound Care With total Assistance of caregiver. Alleyvn to left buttocks  Barrier cream to left buttucks

## 2014-10-25 LAB — GLUCOSE, CAPILLARY: Glucose-Capillary: 125 mg/dL — ABNORMAL HIGH (ref 70–99)

## 2014-10-25 MED ORDER — SODIUM BICARBONATE 650 MG PO TABS: 1300.0000 mg | ORAL_TABLET | Freq: Two times a day (BID) | ORAL | Status: DC

## 2014-10-25 MED ORDER — NICOTINE 14 MG/24HR TD PT24: 14.0000 mg | MEDICATED_PATCH | TRANSDERMAL | Status: DC

## 2014-10-25 MED ORDER — FERROUS SULFATE 325 (65 FE) MG PO TABS: 325.0000 mg | ORAL_TABLET | Freq: Every day | ORAL | Status: DC

## 2014-10-25 MED ORDER — HYDROXYCHLOROQUINE SULFATE 200 MG PO TABS: 400.0000 mg | ORAL_TABLET | Freq: Every day | ORAL | Status: DC

## 2014-10-25 MED ORDER — ALPRAZOLAM 0.5 MG PO TABS: 0.5000 mg | ORAL_TABLET | Freq: Two times a day (BID) | ORAL | Status: DC | PRN

## 2014-10-25 MED ORDER — FUROSEMIDE 80 MG PO TABS: 80.0000 mg | ORAL_TABLET | Freq: Two times a day (BID) | ORAL | Status: DC

## 2014-10-25 MED ORDER — AMLODIPINE BESYLATE 10 MG PO TABS: 10.0000 mg | ORAL_TABLET | Freq: Every day | ORAL | Status: DC

## 2014-10-25 MED ORDER — DICLOFENAC SODIUM 1 % TD GEL: 2.0000 g | Freq: Four times a day (QID) | TRANSDERMAL | Status: DC

## 2014-10-25 MED ORDER — HYDRALAZINE HCL 25 MG PO TABS: 25.0000 mg | ORAL_TABLET | Freq: Three times a day (TID) | ORAL | Status: DC

## 2014-10-25 MED ORDER — PREDNISONE 10 MG PO TABS: ORAL_TABLET | ORAL | Status: DC

## 2014-10-25 MED ORDER — ISOSORBIDE MONONITRATE ER 60 MG PO TB24: 60.0000 mg | ORAL_TABLET | Freq: Every day | ORAL | Status: DC

## 2014-10-25 MED ORDER — VITAMIN D (ERGOCALCIFEROL) 1.25 MG (50000 UNIT) PO CAPS: 50000.0000 [IU] | ORAL_CAPSULE | ORAL | Status: DC

## 2014-10-25 MED ORDER — ATORVASTATIN CALCIUM 20 MG PO TABS: 20.0000 mg | ORAL_TABLET | Freq: Every day | ORAL | Status: DC

## 2014-10-25 MED ORDER — SENNOSIDES-DOCUSATE SODIUM 8.6-50 MG PO TABS: 1.0000 | ORAL_TABLET | Freq: Two times a day (BID) | ORAL | Status: DC

## 2014-10-25 MED ORDER — LABETALOL HCL 200 MG PO TABS: 400.0000 mg | ORAL_TABLET | Freq: Two times a day (BID) | ORAL | Status: DC

## 2014-10-25 MED ORDER — ACETAMINOPHEN 325 MG PO TABS: 650.0000 mg | ORAL_TABLET | ORAL | Status: DC | PRN

## 2014-10-25 MED ORDER — PANTOPRAZOLE SODIUM 40 MG PO TBEC: 40.0000 mg | DELAYED_RELEASE_TABLET | Freq: Two times a day (BID) | ORAL | Status: DC

## 2014-10-25 MED ORDER — MUSCLE RUB 10-15 % EX CREA: 1.0000 "application " | TOPICAL_CREAM | Freq: Three times a day (TID) | CUTANEOUS | Status: DC

## 2014-10-25 MED ORDER — CLONIDINE HCL 0.3 MG PO TABS
0.3000 mg | ORAL_TABLET | Freq: Three times a day (TID) | ORAL | Status: DC
Start: 2014-10-25 — End: 2015-02-22

## 2014-10-25 MED ORDER — MYCOPHENOLATE MOFETIL 250 MG PO CAPS: 1000.0000 mg | ORAL_CAPSULE | Freq: Two times a day (BID) | ORAL | Status: DC

## 2014-10-25 NOTE — Discharge Instructions (Signed)
Inpatient Rehab Discharge Instructions  [name] [name] Discharge date and time: 10/25/14   Activities/Precautions/ Functional Status: Activity: activity as tolerated Diet: cardiac diet Wound Care: none needed   Functional status:  ___ No restrictions     ___ Walk up steps independently ___ 24/7 supervision/assistance   ___ Walk up steps with assistance _X__ Intermittent supervision/assistance  _X__ Bathe/dress independently ___ Walk with walker     ___ Bathe/dress with assistance ___ Walk Independently    ___ Shower independently ___ Walk with assistance    ___ Shower with assistance _X__ No alcohol/tobacco    ___ Return to work/school ________  Special Instructions: 1. Set appointment with opthalmology for eye exam in the next 1-2 weeks. Need to get baseline due to plaquenil (can cause eye problems)   COMMUNITY REFERRALS UPON DISCHARGE:    Home Health:   PT, OT, [place] EUMPN:[phone] Date of last service:10/25/2014   Medical Equipment/Items Ordered:ROLLING [facility]  Agency/Supplier:[facility]    [phone]   GENERAL COMMUNITY RESOURCES FOR PATIENT/FAMILY: Support Groups:[facility] SUPPORT GROUP  STROKE/TIA DISCHARGE INSTRUCTIONS SMOKING Cigarette smoking nearly doubles your risk of having a stroke & is the single most alterable risk factor  If you smoke or have smoked in the last 12 months, you are advised to quit smoking for your health.  Most of the excess cardiovascular risk related to smoking disappears within a year of stopping.  Ask you doctor about anti-smoking medications  [place] Quit Line: 1-800-QUIT NOW  Free Smoking Cessation Classes [phone]  CHOLESTEROL Know your levels; limit fat & cholesterol in your diet  Lipid Panel     Component Value Date/Time   CHOL 78 10/12/2014 1246   TRIG 92 10/12/2014 1246   HDL 12* 10/12/2014 1246   CHOLHDL 6.5 10/12/2014 1246   VLDL 18 10/12/2014 1246   [place] 48 10/12/2014 1246      Many  patients benefit from treatment even if their cholesterol is at goal.  Goal: Total Cholesterol (CHOL) less than 160  Goal:  Triglycerides (TRIG) less than 150  Goal:  HDL greater than 40  Goal:  LDL (LDLCALC) less than 100   BLOOD PRESSURE American Stroke Association blood pressure target is less that 120/80 mm/Hg  Your discharge blood pressure is:  BP: (!) 160/65 mmHg  Monitor your blood pressure  Limit your salt and alcohol intake  Many individuals will require more than one medication for high blood pressure  DIABETES (A1c is a blood sugar average for last 3 months) Goal HGBA1c is under 7% (HBGA1c is blood sugar average for last 3 months)  Diabetes: No known diagnosis of diabetes    Lab Results  Component Value Date   HGBA1C 5.2 10/12/2014     Your HGBA1c can be lowered with medications, healthy diet, and exercise.  Check your blood sugar as directed by your physician  Call your physician if you experience unexplained or low blood sugars.  PHYSICAL ACTIVITY/REHABILITATION Goal is 30 minutes at least 4 days per week  Activity: No driving, Therapies: See above Return to work: To be decided on follow up appointment.  Activity decreases your risk of heart attack and stroke and makes your heart stronger.  It helps control your weight and blood pressure; helps you relax and can improve your mood.  Participate in a regular exercise program.  Talk with your doctor about the best form of exercise for you (dancing, walking, swimming, cycling).  DIET/WEIGHT Goal is to maintain a healthy  weight  Your discharge diet is: Diet Carb Modified thin liquids Your height is:  Height: 5\' 5"  (165.1 cm) Your current weight is: Weight: 67.586 kg (149 lb) Your Body Mass Index (BMI) is:  BMI (Calculated): 26.1  Following the type of diet specifically designed for you will help prevent another stroke.  Your goal weight  is: 150 lbs  Your goal Body Mass Index (BMI) is 19-24.  Healthy food  habits can help reduce 3 risk factors for stroke:  High cholesterol, hypertension, and excess weight.  RESOURCES Stroke/Support Group:  Call [phone]   STROKE EDUCATION PROVIDED/REVIEWED AND GIVEN TO PATIENT Stroke warning signs and symptoms How to activate emergency medical system (call 911). Medications prescribed at discharge. Need for follow-up after discharge. Personal risk factors for stroke. Pneumonia vaccine given:  Flu vaccine given:  My questions have been answered, the writing is legible, and I understand these instructions.  I will adhere to these goals & educational materials that have been provided to me after my discharge from the hospital.      My questions have been answered and I understand these instructions. I will adhere to these goals and the provided educational materials after my discharge from the hospital.  Patient/Caregiver Signature _______________________________ Date __________  Clinician Signature _______________________________________ Date __________  Please bring this form and your medication list with you to all your follow-up doctor's appointments.

## 2014-10-25 NOTE — Progress Notes (Signed)
Patient received verbal and written discharge instructions from Algis Liming, PA-C with verbal understanding. Patient discharged to home with family and belongings.

## 2014-10-25 NOTE — Progress Notes (Signed)
Subjective/Complaints: Doing well amb in room No abdominal discomfort, no breathing problems no bladder problems  Review of Systems - Negative except as above  Objective: Vital Signs: Blood pressure 177/78, pulse 103, temperature 97.7 F (36.5 C), temperature source Oral, resp. rate 19, height 5\' 5"  (1.651 m), weight 67.586 kg (149 lb), last menstrual period 07/18/2012, SpO2 100 %. No results found. Results for orders placed or performed during the hospital encounter of 10/17/14 (from the past 72 hour(s))  Glucose, capillary     Status: Abnormal   Collection Time: 10/22/14 11:30 AM  Result Value Ref Range   Glucose-Capillary 128 (H) 70 - 99 mg/dL   Comment 1 Documented in Chart   Glucose, capillary     Status: Abnormal   Collection Time: 10/22/14  4:39 PM  Result Value Ref Range   Glucose-Capillary 318 (H) 70 - 99 mg/dL  Glucose, capillary     Status: Abnormal   Collection Time: 10/22/14  8:56 PM  Result Value Ref Range   Glucose-Capillary 132 (H) 70 - 99 mg/dL  Glucose, capillary     Status: Abnormal   Collection Time: 10/23/14  7:00 AM  Result Value Ref Range   Glucose-Capillary 129 (H) 70 - 99 mg/dL  Glucose, capillary     Status: Abnormal   Collection Time: 10/23/14 12:21 PM  Result Value Ref Range   Glucose-Capillary 62 (L) 70 - 99 mg/dL  Glucose, capillary     Status: None   Collection Time: 10/23/14 12:54 PM  Result Value Ref Range   Glucose-Capillary 91 70 - 99 mg/dL  Glucose, capillary     Status: Abnormal   Collection Time: 10/23/14  4:33 PM  Result Value Ref Range   Glucose-Capillary 349 (H) 70 - 99 mg/dL  Glucose, capillary     Status: Abnormal   Collection Time: 10/23/14  8:33 PM  Result Value Ref Range   Glucose-Capillary 263 (H) 70 - 99 mg/dL  Glucose, capillary     Status: Abnormal   Collection Time: 10/24/14  6:26 AM  Result Value Ref Range   Glucose-Capillary 125 (H) 70 - 99 mg/dL  Glucose, capillary     Status: Abnormal   Collection Time:  10/24/14 12:17 PM  Result Value Ref Range   Glucose-Capillary 103 (H) 70 - 99 mg/dL  Glucose, capillary     Status: Abnormal   Collection Time: 10/24/14  5:09 PM  Result Value Ref Range   Glucose-Capillary 198 (H) 70 - 99 mg/dL  Glucose, capillary     Status: Abnormal   Collection Time: 10/24/14  8:53 PM  Result Value Ref Range   Glucose-Capillary 286 (H) 70 - 99 mg/dL  Glucose, capillary     Status: Abnormal   Collection Time: 10/25/14  6:46 AM  Result Value Ref Range   Glucose-Capillary 125 (H) 70 - 99 mg/dL     HEENT: normal Cardio: RRR and no murmur Resp: CTA B/L and unlabored GI: BS positive and NT, mild distension, +tympani Extremity:  Pulses positive and No Edema Skin:   Intact Neuro: Alert/Oriented, Normal Sensory, Abnormal Motor 5 minus/5 in the left deltoid, biceps, triceps, grip, hip flexor, knee extensor, ankle dorsal flexor plantar flexor and Abnormal FMC Ataxic/ dec FMC Musc/Skel:  Other no joint tenderness in the knees hands and feet, full active range of motion without pain Gen. no acute distress   Assessment/Plan: 1. 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  Stable for D/C today F/u PCP in 1-2  weeks F/u Rheum F/u PM&R 3 weeks See D/C summary See D/C instructions FIM: FIM - Bathing Bathing Steps Patient Completed: Chest, Right Arm, Left Arm, Abdomen, Front perineal area, Buttocks, Right upper leg, Left upper leg, Right lower leg (including foot), Left lower leg (including foot) Bathing: 6: Assistive device (Comment)  FIM - Upper Body Dressing/Undressing Upper body dressing/undressing steps patient completed: Thread/unthread left bra strap, Thread/unthread right bra strap, Hook/unhook bra, Thread/unthread right sleeve of pullover shirt/dresss, Thread/unthread left sleeve of pullover shirt/dress, Put head through opening of pull over shirt/dress, Pull shirt over trunk Upper body dressing/undressing: 6: More than  reasonable amount of time FIM - Lower Body Dressing/Undressing Lower body dressing/undressing steps patient completed: Thread/unthread right underwear leg, Thread/unthread left underwear leg, Pull underwear up/down, Thread/unthread right pants leg, Thread/unthread left pants leg, Pull pants up/down, Don/Doff right shoe, Don/Doff left shoe, Fasten/unfasten right shoe, Fasten/unfasten left shoe, Don/Doff right sock, Don/Doff left sock Lower body dressing/undressing: 6: More than reasonable amount of time  FIM - Toileting Toileting steps completed by patient: Adjust clothing prior to toileting, Performs perineal hygiene, Adjust clothing after toileting Toileting Assistive Devices: Grab bar or rail for support Toileting: 6: Assistive device: No helper  FIM - Radio producer Devices: Insurance account manager Transfers: 6-To toilet/ BSC, 6-From toilet/BSC  FIM - Control and instrumentation engineer Devices: Copy: 6: Supine > Sit: No assist, 6: Sit > Supine: No assist, 6: Bed > Chair or W/C: No assist, 6: Chair or W/C > Bed: No assist  FIM - Locomotion: Wheelchair Distance:  (pt is ambulatory at D/C) Locomotion: Wheelchair: 0: Activity did not occur FIM - Locomotion: Ambulation Locomotion: Ambulation Assistive Devices: Administrator Ambulation/Gait Assistance: 6: Modified independent (Device/Increase time) Locomotion: Ambulation: 6: Travels 150 ft or more with assistive device/no helper  Comprehension Comprehension Mode: Auditory Comprehension: 6-Follows complex conversation/direction: With extra time/assistive device  Expression Expression Mode: Verbal Expression: 6-Expresses complex ideas: With extra time/assistive device  Social Interaction Social Interaction Mode: Not assessed Social Interaction: 6-Interacts appropriately with others with medication or extra time (anti-anxiety, antidepressant).  Problem Solving Problem Solving:  5-Solves complex 90% of the time/cues < 10% of the time  Memory Memory: 5-Requires cues to use assistive device  Medical Problem List and Plan: 1. 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   2.  DVT Prophylaxis/Anticoagulation: Mechanical:  Antiembolism stockings, knee (TED hose) Bilateral lower extremities Sequential compression devices, below knee Bilateral lower extremities 3. Pain Management: flexeril prn for spasms. rx of connective tissue disorder/rx with prednsione 4. Mood: team to provide ego support as needed. Appears to be in good spirits at present 5. Neuropsych: This patient is capable of making decisions on her own behalf. 6. Skin/Wound Care: ensure appropriate nutrition and skin pressure relief measures 7. Fluids/Electrolytes/Nutrition: encourage PO intake. labwork on admit 8. Acute on chronic renal failure---baseline Cr around 3.3.             -cellcept resumed, prednisone for now 9. Mixed Connective Tissue Disorder/RA-             -pain much better controlled after steroid burst             -outpt rheum follow up             -plaquenil             -prednisone taper , monitor for recurrence of symptoms  10. Likely hemolytic anemia             -continue to monitor blood counts             -immuno-supressive therapy as above 11. Steroid induced hyperglycemia:             -SSI 12. Malignant hypertension:  mainly systolic elevation at this point             -lasix, catapres, labetalol, imdur, add norvasc             -further adjustments, increased catapres  LOS (Days) 8 A FACE TO FACE EVALUATION WAS PERFORMED  KIRSTEINS,ANDREW E 10/25/2014, 7:53 AM

## 2014-10-30 NOTE — Discharge Summary (Signed)
Physician Discharge Summary  Patient ID: [name] [name] MRN: [id number] DOB/AGE: Oct 19, 1961 53 y.o.  Admit date: 10/17/2014 Discharge date: 10/25/2014  Discharge Diagnoses:  Principal Problem:   Pontine hemorrhage Active Problems:   Rheumatoid arthritis   Malignant hypertension   Acute renal failure superimposed on stage 4 chronic kidney disease   MCTD (mixed connective tissue disease)   Discharged Condition: Stable    Labs:  Basic Metabolic Panel: BMP Latest Ref Rng 10/20/2014 10/18/2014 10/16/2014  Glucose 70 - 99 mg/dL 173(H) 172(H) 180(H)  BUN 6 - 23 mg/dL 85(H) 94(H) 93(H)  Creatinine 0.50 - 1.10 mg/dL 3.27(H) 3.21(H) 3.37(H)  Sodium 137 - 147 mEq/L 137 138 137  Potassium 3.7 - 5.3 mEq/L 4.4 3.8 3.9  Chloride 96 - 112 mEq/L 99 98 95(L)  CO2 19 - 32 mEq/L 23 25 26   Calcium 8.4 - 10.5 mg/dL 9.3 9.3 9.5     CBC: CBC Latest Ref Rng 10/20/2014 10/18/2014 10/14/2014  WBC 4.0 - 10.5 K/uL 11.0(H) 12.8(H) 11.6(H)  Hemoglobin 12.0 - 15.0 g/dL 8.6(L) 8.4(L) 8.8(L)  Hematocrit 36.0 - 46.0 % 25.7(L) 26.2(L) 25.8(L)  Platelets 150 - 400 K/uL 238 263 267     CBG:  Recent Labs Lab 10/24/14 0626 10/24/14 1217 10/24/14 1709 10/24/14 2053 10/25/14 0646  GLUCAP 125* 103* 198* 286* 125*     Today's Vitals   10/24/14 2200 10/25/14 0508 10/25/14 0653 10/25/14 1037  BP: 164/60 177/78  160/65  Pulse: 88 103  78  Temp:  97.7 F (36.5 C)  97.7 F (36.5 C)  TempSrc:  Oral    Resp:  19  18  Height:      Weight:   67.586 kg (149 lb)   SpO2:  100%  100%  PainSc:       Brief HPI:   [name] [name] is a 53 y.o. female with history of HTN, hyperlipidemia, lupus with mild LLE weakness as well as compliance issues who was admitted on 09/28/14 with elevated BP and subsequent acute hemorrhagic infarct in pons and chronic subcortical white matter disease in right frontoparietal area.she has had fluctuating bouts of lethargy and follow up MRI/MRA brain done on 12/03 revealing  changes c/w acute/subacute hemorrhage in right parmedian pons and no underlying mass evident. Hospital course complicated by fluid overload requiring diuresis as well as complaints of left neck/shoulder pain. She was admitted to CIR on 10/05/14 but developed diffuse joint pain with fever, acute drop in Hgb to 6.1 with , elevated WBC 21.9 and hyperkalemia with K- 5.5. She was transferred to acute services and transfused with 2 units PRBC and started on IV antibiotics briefly. Work up was consistent with connective disease flare with blood  work revealing ANA +, elevated RA factor, normal complement and elevated levels mycophenolic acid.  She was started on high dose steroids per input from Dr. Marcelino Scot and renal service  has assisted with with management of metabolic acidosis. Nephrology questioned hydralazine induced lupus and renal status has shown gradual improvement. She was started on plaquenil with recommendations for outpatient follow up with Rheumatology and opthalmology.   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 H/H has been stable and arthralgias and lethargy has resolved. Therapies was ongoing and she was readmitted to complete CIR program.    Hospital Course: [name] [name] was admitted to rehab 10/17/2014 for inpatient therapies to consist of PT, ST and OT at least three hours five days  a week. Past admission physiatrist, therapy team and rehab RN have worked together to provide customized collaborative inpatient rehab. She was started on slow steroid taper and SSI was used to manage steroid induced hyperglycemia as Hgb A1c WNL. Po intake has been good and she is continent of bowel and bladder. She was educated on CM diet and is to continue this while on steroids. Pain has been well controlled on tylenol alone and no joint tenderness reported with full ROM. Blood pressures continued to be labile requiring multiple adjustment  in medication regimen. She was educated on importance of medication adherence and is to follow up with Dr. [name] for further adjustment in medication regimen. H/H has been stable on serial recheck and BUN/Cr has improved to 85/3.27.  She continues to have right ataxia but has had improvement in balance and coordination. She has made good progress during her rehab stay and is currently independent for mobility and ADL tasks. Supervision is recommended due to cues required for mildly complex tasks. She will continue to receive [place], [place], [place] by  [place] past discharge. She has not seen Rheumatology for the past 2-3 years and has been set up with Dr. Amil Amen for follow up as new patient. Importance of follow up with rheumatology as well as opthalmology for baseline vision screening has been stressed with patient. She was discharged to home on 112/23/15.    Rehab course: During patient's stay in rehab weekly team conferences were held to monitor patient's progress, set goals and discuss barriers to discharge. At admission patient required min assist with ADL tasks as well as mobility. She has had improvement in activity tolerance, balance, postural control, as well as ability to compensate for deficits. She is ambulating > 200' with RW at modified independent level. BERG score has improved from 29/56 to 42/56. She requires supervision for stair navigation. She is able to complete ADL tasks independently and needs distant supervision for shower transfers. She has been educated on HEP for [facility] and advised on having supervision with IADLs. Speech therapy has worked with patient on cognitive tasks. Patient requires supervision with multimodal cues for functional problem solving with mildly complex tasks, recall of new information and anticipatory awareness.   Disposition: 01-Home or Self Care   Diet: Carb modified diet  Special Instructions: 1. Set appointment with opthalmology for eye exam in the  next 1-2 weeks. Need to get baseline due to plaquenil (can cause eye problems)      Medication List    STOP taking these medications        cyclobenzaprine 5 MG tablet  Commonly known as:  FLEXERIL     Vitamin D3 50000 UNITS Caps      TAKE these medications        acetaminophen 325 MG tablet  Commonly known as:  TYLENOL  Take 2 tablets (650 mg total) by mouth every 4 (four) hours as needed for mild pain.     ALPRAZolam 0.5 MG tablet  Commonly known as:  XANAX  Take 1 tablet (0.5 mg total) by mouth 2 (two) times daily as needed for anxiety.     amLODipine 10 MG tablet  Commonly known as:  NORVASC  Take 1 tablet (10 mg total) by mouth daily.     atorvastatin 20 MG tablet  Commonly known as:  LIPITOR  Take 1 tablet (20 mg total) by mouth daily at 6 PM.     cloNIDine 0.3 MG tablet  Commonly known as:  CATAPRES  Take 1 tablet (0.3 mg total) by mouth 3 (three) times daily.     diclofenac sodium 1 % Gel  Commonly known as:  VOLTAREN  Apply 2 g topically 4 (four) times daily. To forefoot     ferrous sulfate 325 (65 FE) MG tablet  Take 1 tablet (325 mg total) by mouth daily with breakfast.     furosemide 80 MG tablet  Commonly known as:  LASIX  Take 1 tablet (80 mg total) by mouth 2 (two) times daily.     hydrALAZINE 25 MG tablet  Commonly known as:  APRESOLINE  Take 1 tablet (25 mg total) by mouth every 8 (eight) hours.     hydroxychloroquine 200 MG tablet  Commonly known as:  PLAQUENIL  Take 2 tablets (400 mg total) by mouth daily.     isosorbide mononitrate 60 MG 24 hr tablet  Commonly known as:  IMDUR  Take 1 tablet (60 mg total) by mouth daily.     labetalol 200 MG tablet  Commonly known as:  NORMODYNE  Take 2 tablets (400 mg total) by mouth 2 (two) times daily.     multivitamin Tabs tablet  Take 1 tablet by mouth daily.     MUSCLE RUB 10-15 % Crea  Apply 1 application topically 3 (three) times daily before meals.     mycophenolate 250 MG capsule   Commonly known as:  CELLCEPT  Take 4 capsules (1,000 mg total) by mouth 2 (two) times daily.     nicotine 14 mg/24hr patch  Commonly known as:  NICODERM CQ - dosed in mg/24 hours  Place 1 patch (14 mg total) onto the skin daily.     pantoprazole 40 MG tablet  Commonly known as:  PROTONIX  Take 1 tablet (40 mg total) by mouth 2 (two) times daily.     predniSONE 10 MG tablet  Commonly known as:  DELTASONE  Take two pills daily for three day. Then decrease to one pill X 3 days. Then decrease to 1/2 pill daily till gone.     senna-docusate 8.6-50 MG per tablet  Commonly known as:  Senokot-S  Take 1 tablet by mouth 2 (two) times daily.     sodium bicarbonate 650 MG tablet  Take 2 tablets (1,300 mg total) by mouth 2 (two) times daily.     Vitamin D (Ergocalciferol) 50000 UNITS Caps capsule  Commonly known as:  DRISDOL  Take 1 capsule (50,000 Units total) by mouth every Wednesday.            Follow-up Information    Follow up with [name], MD On 11/28/2014.   Specialty:  Physical Medicine and Rehabilitation   Why:  Be there at 9:00  for 9:30 am appointment   Contact information:   [place] [address] [phone]       Follow up with Hennie Duos, MD On 11/22/2014.   Specialty:  Rheumatology   Why:  Appointment at 10:30. Arrive 15 minutes early. Need picture ID, insurance card and co payment.    Contact information:   [address] [place]  [place] [address] [phone]       Follow up with [facility].   Why:  IIN THE NEXT WEEK FOLLOW UP WITH MD THERE. WALK IN M-THURS 8;00-8;30 PM, FRI-8;00-6;00PM SAT & SUN 8;00-4;00PM   Contact information:   [address] [place] [address] [phone]  SignedBary Leriche 10/30/2014, 6:34 PM

## 2014-11-03 DIAGNOSIS — I639 Cerebral infarction, unspecified: Secondary | ICD-10-CM

## 2014-11-03 HISTORY — DX: Cerebral infarction, unspecified: I63.9

## 2014-11-06 ENCOUNTER — Telehealth: Payer: Self-pay | Admitting: *Deleted

## 2014-11-06 ENCOUNTER — Emergency Department (HOSPITAL_COMMUNITY): Payer: Managed Care, Other (non HMO)

## 2014-11-06 ENCOUNTER — Emergency Department (HOSPITAL_COMMUNITY)
Admission: EM | Admit: 2014-11-06 | Discharge: 2014-11-06 | Disposition: A | Discharge status: Home / Self Care | Payer: Managed Care, Other (non HMO) | Attending: Emergency Medicine | Admitting: Emergency Medicine

## 2014-11-06 ENCOUNTER — Encounter (HOSPITAL_COMMUNITY): Payer: Self-pay | Admitting: Cardiology

## 2014-11-06 DIAGNOSIS — N184 Chronic kidney disease, stage 4 (severe): Secondary | ICD-10-CM | POA: Diagnosis not present

## 2014-11-06 DIAGNOSIS — Z79899 Other long term (current) drug therapy: Secondary | ICD-10-CM | POA: Insufficient documentation

## 2014-11-06 DIAGNOSIS — R1013 Epigastric pain: Secondary | ICD-10-CM | POA: Insufficient documentation

## 2014-11-06 DIAGNOSIS — Z8673 Personal history of transient ischemic attack (TIA), and cerebral infarction without residual deficits: Secondary | ICD-10-CM | POA: Diagnosis not present

## 2014-11-06 DIAGNOSIS — M1990 Unspecified osteoarthritis, unspecified site: Secondary | ICD-10-CM | POA: Insufficient documentation

## 2014-11-06 DIAGNOSIS — R079 Chest pain, unspecified: Secondary | ICD-10-CM | POA: Diagnosis present

## 2014-11-06 DIAGNOSIS — G8929 Other chronic pain: Secondary | ICD-10-CM | POA: Diagnosis not present

## 2014-11-06 DIAGNOSIS — J029 Acute pharyngitis, unspecified: Secondary | ICD-10-CM | POA: Insufficient documentation

## 2014-11-06 DIAGNOSIS — Z8781 Personal history of (healed) traumatic fracture: Secondary | ICD-10-CM | POA: Diagnosis not present

## 2014-11-06 DIAGNOSIS — R1310 Dysphagia, unspecified: Secondary | ICD-10-CM

## 2014-11-06 DIAGNOSIS — R05 Cough: Secondary | ICD-10-CM | POA: Diagnosis not present

## 2014-11-06 DIAGNOSIS — J3489 Other specified disorders of nose and nasal sinuses: Secondary | ICD-10-CM | POA: Insufficient documentation

## 2014-11-06 DIAGNOSIS — I129 Hypertensive chronic kidney disease with stage 1 through stage 4 chronic kidney disease, or unspecified chronic kidney disease: Secondary | ICD-10-CM | POA: Diagnosis not present

## 2014-11-06 DIAGNOSIS — R0602 Shortness of breath: Secondary | ICD-10-CM | POA: Diagnosis not present

## 2014-11-06 DIAGNOSIS — R0789 Other chest pain: Secondary | ICD-10-CM

## 2014-11-06 DIAGNOSIS — D649 Anemia, unspecified: Secondary | ICD-10-CM | POA: Insufficient documentation

## 2014-11-06 DIAGNOSIS — Z720 Tobacco use: Secondary | ICD-10-CM | POA: Diagnosis not present

## 2014-11-06 DIAGNOSIS — E119 Type 2 diabetes mellitus without complications: Secondary | ICD-10-CM | POA: Diagnosis not present

## 2014-11-06 LAB — BASIC METABOLIC PANEL
Anion gap: 16 — ABNORMAL HIGH (ref 5–15)
BUN: 86 mg/dL — AB (ref 6–23)
CALCIUM: 8.5 mg/dL (ref 8.4–10.5)
CO2: 17 mmol/L — ABNORMAL LOW (ref 19–32)
Chloride: 98 mEq/L (ref 96–112)
Creatinine, Ser: 4.5 mg/dL — ABNORMAL HIGH (ref 0.50–1.10)
GFR calc Af Amer: 12 mL/min — ABNORMAL LOW (ref >90)
GFR calc non Af Amer: 10 mL/min — ABNORMAL LOW (ref >90)
GLUCOSE: 135 mg/dL — AB (ref 70–99)
Potassium: 5.2 mmol/L — ABNORMAL HIGH (ref 3.5–5.1)
Sodium: 131 mmol/L — ABNORMAL LOW (ref 135–145)

## 2014-11-06 LAB — CBC
HEMATOCRIT: 26.5 % — AB (ref 36.0–46.0)
Hemoglobin: 8.6 g/dL — ABNORMAL LOW (ref 12.0–15.0)
MCH: 26.6 pg (ref 26.0–34.0)
MCHC: 32.5 g/dL (ref 30.0–36.0)
MCV: 82 fL (ref 78.0–100.0)
Platelets: 119 10*3/uL — ABNORMAL LOW (ref 150–400)
RBC: 3.23 MIL/uL — ABNORMAL LOW (ref 3.87–5.11)
RDW: 13.8 % (ref 11.5–15.5)
WBC: 13.1 10*3/uL — ABNORMAL HIGH (ref 4.0–10.5)

## 2014-11-06 LAB — I-STAT TROPONIN, ED: Troponin i, poc: 0.03 ng/mL (ref 0.00–0.08)

## 2014-11-06 MED ORDER — MAGIC MOUTHWASH W/LIDOCAINE: 5.0000 mL | Freq: Three times a day (TID) | ORAL | Status: DC | PRN

## 2014-11-06 MED ORDER — GI COCKTAIL ~~LOC~~
30.0000 mL | Freq: Once | ORAL | Status: AC
  Administered 2014-11-06: 30 mL via ORAL
  Filled 2014-11-06: qty 30

## 2014-11-06 MED ORDER — NYSTATIN-TRIAMCINOLONE 100000-0.1 UNIT/GM-% EX OINT: 1.0000 "application " | TOPICAL_OINTMENT | Freq: Two times a day (BID) | CUTANEOUS | Status: DC

## 2014-11-06 MED ORDER — SODIUM CHLORIDE 0.9 % IV BOLUS (SEPSIS)
1000.0000 mL | Freq: Once | INTRAVENOUS | Status: AC
  Administered 2014-11-06: 1000 mL via INTRAVENOUS

## 2014-11-06 MED ORDER — PANTOPRAZOLE SODIUM 40 MG IV SOLR
40.0000 mg | Freq: Once | INTRAVENOUS | Status: AC
  Administered 2014-11-06: 40 mg via INTRAVENOUS
  Filled 2014-11-06: qty 40

## 2014-11-06 MED ORDER — OMEPRAZOLE 20 MG PO CPDR: 20.0000 mg | DELAYED_RELEASE_CAPSULE | Freq: Every day | ORAL | Status: DC

## 2014-11-06 NOTE — ED Notes (Signed)
Patient returned from X-ray 

## 2014-11-06 NOTE — ED Notes (Signed)
Pt reports cough with small amount of productive phlegm. Pt reports chills/sweats since yesterday. PT also reports of epigastric pain since yesterday.

## 2014-11-06 NOTE — ED Provider Notes (Signed)
CSN: [id number]     Arrival date & time 11/06/14  1220 History   First MD Initiated Contact with Patient 11/06/14 1605     Chief Complaint  Patient presents with  . Chest Pain  . Dysphagia     (Consider location/radiation/quality/duration/timing/severity/associated sxs/prior Treatment) HPI Comments: [name] [name] is a 54 y.o. 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 She reports that when she swallows, she experiences a sharp pain from the back of her throat into the epigastrum, which she states is 9/10, radiating to her back, coming intermittently only with swallowing, unrelieved with times, and mildly relieved with elevating the head of her bed last night. She reports this occurs with all foods and liquids. She states that when she experiences this pain, she notices a "swelling" at the bottom of her rib cage where she is experiencing the pain. Additionally she reports that for the last 2 weeks she has had intermittent shortness of breath, which was evaluated while she was in the hospital and has not changed. She also has had a cough with clear sputum production and clear rhinorrhea. Additionally she states that since the hospitalization, her stool has been darker but not tarry. She denies any fevers, chills, ear pain or drainage, abdominal pain, nausea, vomiting, diarrhea, constipation, dysuria, hematuria, vaginal discharge or bleeding, hematochezia, lightheadedness, dizziness, headache, vision changes, recent travel or antibiotics, or sick contacts.  Patient is a 54 y.o. female presenting with chest pain. The history is provided by the patient. No language interpreter was used.  Chest Pain Pain location:  Epigastric Pain quality: sharp   Pain radiates to:   Mid back Pain radiates to the back: yes   Pain severity:  Severe (9/10) Onset quality:  Gradual Duration:  1 day Timing:  Intermittent Progression:  Unchanged Chronicity:  New Context: eating   Relieved by:  Certain positions (raising head of bed) Exacerbated by: eating. Ineffective treatments:  Antacids Associated symptoms: cough (clear sputum production), dysphagia (painful), heartburn and shortness of breath (chronic and ongoing since discharge)   Associated symptoms: no abdominal pain, no altered mental status, no anxiety, no diaphoresis, no dizziness, no fever, no headache, no lower extremity edema, no nausea, no near-syncope, no numbness, no orthopnea, no palpitations, no PND, no syncope, not vomiting and no weakness   Risk factors: diabetes mellitus and smoking     Past Medical History  Diagnosis Date  . Hypertension   . Lupus   . CKD (chronic kidney disease)     due to lupus/Dr. [name]  . Diabetes mellitus   . Stenosis of cervical spine region     with HNP at C5/6, C6/7  . Metatarsal bone fracture right    4th  . 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 Arthritis     RA  . Anemia   . Stroke    Past Surgical History  Procedure Laterality Date  . Breast biopsy     Family History  Problem Relation Age of Onset  . Hypertension    . Lupus    . Rheum arthritis    . Hypertension Mother   . Diabetes Mother   . Hypertension Sister   . Diabetes Father   .  Hypertension Maternal Grandmother    History  Substance Use Topics  . Smoking status: Current Every Day Smoker -- 0.50 packs/day for 30 years    Types: Cigarettes  . Smokeless tobacco: Never Used  . Alcohol Use: No   OB History    No data available     Review of Systems  Constitutional: Negative for fever, chills and diaphoresis.  HENT: Positive for rhinorrhea (clear), sore throat and trouble swallowing (painful). Negative for drooling, ear discharge and ear  pain.   Eyes: Negative for pain and visual disturbance.  Respiratory: Positive for cough (clear sputum production) and shortness of breath (chronic and ongoing since discharge). Negative for wheezing.   Cardiovascular: Positive for chest pain. Negative for palpitations, orthopnea, leg swelling, syncope, PND and near-syncope.  Gastrointestinal: Positive for heartburn. Negative for nausea, vomiting, abdominal pain, diarrhea and constipation.  Genitourinary: Negative for dysuria, hematuria, vaginal bleeding and vaginal discharge.  Musculoskeletal: Negative for myalgias, arthralgias and neck pain.  Skin: Negative for rash.  Allergic/Immunologic: Positive for immunocompromised state. Negative for environmental allergies.  Neurological: Negative for dizziness, weakness, light-headedness, numbness and headaches.  Psychiatric/Behavioral: Negative for confusion.   10 Systems reviewed and are negative for acute change except as noted in the HPI.    Allergies  Sulfa antibiotics  Home Medications   Prior to Admission medications   Medication Sig Start Date End Date Taking? Authorizing Provider  acetaminophen (TYLENOL) 325 MG tablet Take 2 tablets (650 mg total) by mouth every 4 (four) hours as needed for mild pain. 10/25/14   Bary Leriche, PA-C  ALPRAZolam Duanne Moron) 0.5 MG tablet Take 1 tablet (0.5 mg total) by mouth 2 (two) times daily as needed for anxiety. 10/25/14   Ivan Anchors Love, PA-C  amLODipine (NORVASC) 10 MG tablet Take 1 tablet (10 mg total) by mouth daily. 10/25/14   Bary Leriche, PA-C  atorvastatin (LIPITOR) 20 MG tablet Take 1 tablet (20 mg total) by mouth daily at 6 PM. 10/25/14   Ivan Anchors Love, PA-C  cloNIDine (CATAPRES) 0.3 MG tablet Take 1 tablet (0.3 mg total) by mouth 3 (three) times daily. 10/25/14   Bary Leriche, PA-C  diclofenac sodium (VOLTAREN) 1 % GEL Apply 2 g topically 4 (four) times daily. To forefoot 10/25/14   Ivan Anchors Love, PA-C  ferrous sulfate 325 (65 FE) MG tablet  Take 1 tablet (325 mg total) by mouth daily with breakfast. 10/25/14   Ivan Anchors Love, PA-C  furosemide (LASIX) 80 MG tablet Take 1 tablet (80 mg total) by mouth 2 (two) times daily. 10/25/14   Bary Leriche, PA-C  hydrALAZINE (APRESOLINE) 25 MG tablet Take 1 tablet (25 mg total) by mouth every 8 (eight) hours. 10/25/14   Bary Leriche, PA-C  hydroxychloroquine (PLAQUENIL) 200 MG tablet Take 2 tablets (400 mg total) by mouth daily. 10/25/14   Bary Leriche, PA-C  isosorbide mononitrate (IMDUR) 60 MG 24 hr tablet Take 1 tablet (60 mg total) by mouth daily. 10/25/14   Bary Leriche, PA-C  labetalol (NORMODYNE) 200 MG tablet Take 2 tablets (400 mg total) by mouth 2 (two) times daily. 10/25/14   Bary Leriche, PA-C  Menthol-Methyl Salicylate (MUSCLE RUB) 10-15 % CREA Apply 1 application topically 3 (three) times daily before meals. 10/25/14   Bary Leriche, PA-C  multivitamin (RENA-VIT) TABS tablet Take 1 tablet by mouth daily.    Historical Provider, MD  mycophenolate (CELLCEPT) 250 MG capsule Take 4 capsules (1,000 mg total) by  mouth 2 (two) times daily. 10/25/14   Bary Leriche, PA-C  nicotine (NICODERM CQ - DOSED IN MG/24 HOURS) 14 mg/24hr patch Place 1 patch (14 mg total) onto the skin daily. 10/25/14   Ivan Anchors Love, PA-C  pantoprazole (PROTONIX) 40 MG tablet Take 1 tablet (40 mg total) by mouth 2 (two) times daily. 10/25/14   Ivan Anchors Love, PA-C  predniSONE (DELTASONE) 10 MG tablet Take two pills daily for three day. Then decrease to one pill X 3 days. Then decrease to 1/2 pill daily till gone. 10/25/14   Ivan Anchors Love, PA-C  senna-docusate (SENOKOT-S) 8.6-50 MG per tablet Take 1 tablet by mouth 2 (two) times daily. 10/25/14   Ivan Anchors Love, PA-C  sodium bicarbonate 650 MG tablet Take 2 tablets (1,300 mg total) by mouth 2 (two) times daily. 10/25/14   Bary Leriche, PA-C  Vitamin D, Ergocalciferol, (DRISDOL) 50000 UNITS CAPS capsule Take 1 capsule (50,000 Units total) by mouth every Wednesday.  10/25/14   Ivan Anchors Love, PA-C   BP 120/70 mmHg  Pulse 100  Temp(Src) 97.9 F (36.6 C)  Resp 18  Wt 149 lb (67.586 kg)  SpO2 99%  LMP 07/18/2012 Physical Exam  Constitutional: She is oriented to person, place, and time. Vital signs are normal. She appears well-developed and well-nourished.  Non-toxic appearance. No distress.  Afebrile, nontoxic, NAD, thin frail appearing female  HENT:  Head: Normocephalic and atraumatic.  Right Ear: Hearing, tympanic membrane, external ear and ear canal normal.  Left Ear: Hearing, tympanic membrane, external ear and ear canal normal.  Nose: Nose normal.  Mouth/Throat: Uvula is midline, oropharynx is clear and moist and mucous membranes are normal. No trismus in the jaw. No uvula swelling.  Uvula midline without deviation, posterior oropharynx clear  Eyes: Conjunctivae and EOM are normal. Right eye exhibits no discharge. Left eye exhibits no discharge.  Neck: Normal range of motion. Neck supple.  Cardiovascular: Normal rate, regular rhythm, normal heart sounds and intact distal pulses.  Exam reveals no gallop and no friction rub.   No murmur heard. RRR, nl s1/s2, no m/r/g  Pulmonary/Chest: Effort normal and breath sounds normal. No respiratory distress. She has no decreased breath sounds. She has no wheezes. She has no rhonchi. She has no rales. She exhibits no tenderness, no crepitus and no retraction.  CTAB in all lung fields, no w/r/r, no chest wall TTP  Abdominal: Soft. Normal appearance and bowel sounds are normal. She exhibits no distension. There is tenderness in the epigastric area. There is no rigidity, no rebound, no guarding, no tenderness at McBurney's point and negative Murphy's sign.    Soft, ND, +BS throughout, with epigastric TTP at xyphoid process, no r/g/r, neg murphy's, neg mcburney's  Musculoskeletal: Normal range of motion.  Baseline strength and sensation intact  Neurological: She is alert and oriented to person, place, and time.  She has normal strength. No sensory deficit.  Skin: Skin is warm, dry and intact. No rash noted.  Psychiatric: She has a normal mood and affect.  Nursing note and vitals reviewed.   ED Course  Procedures (including critical care time) Labs Review Labs Reviewed  CBC - Abnormal; Notable for the following:    WBC 13.1 (*)    RBC 3.23 (*)    Hemoglobin 8.6 (*)    HCT 26.5 (*)    Platelets 119 (*)    All other components within normal limits  BASIC METABOLIC PANEL - Abnormal; Notable for the following:  Sodium 131 (*)    Potassium 5.2 (*)    CO2 17 (*)    Glucose, Bld 135 (*)    BUN 86 (*)    Creatinine, Ser 4.50 (*)    GFR calc non Af Amer 10 (*)    GFR calc Af Amer 12 (*)    Anion gap 16 (*)    All other components within normal limits  I-STAT TROPOININ, ED    Imaging Review Dg Chest 2 View  11/06/2014   CLINICAL DATA:  Initial evaluation for dysphagia and chest pain, shortness of breath, personal history of lupus and stroke, patient smokes  EXAM: CHEST  2 VIEW  COMPARISON:  10/10/2014  FINDINGS: Mild cardiac enlargement. Vascular pattern is normal. No consolidation or effusion. No change from prior study.  IMPRESSION: Stable cardiac enlargement with no acute findings   Electronically Signed   By: Skipper Cliche M.D.   On: 11/06/2014 14:29     EKG Interpretation   Date/Time:  Monday November 06 2014 12:30:51 EST Ventricular Rate:  101 PR Interval:  170 QRS Duration: 80 QT Interval:  348 QTC Calculation: 451 R Axis:   0 Text Interpretation:  Sinus tachycardia Biatrial enlargement Septal  infarct , age undetermined Abnormal ECG Sinus tachycardia T wave  abnormality diffusely, but with new inversion in aVl Abnormal ekg  Confirmed by Carmin Muskrat  MD [phone]) on 11/06/2014 4:04:26 PM      MDM   Final diagnoses:  Dysphagia  Atypical chest pain  CKD (chronic kidney disease), stage 4 (severe)    54 y.o. female with dysphagia and CP. EKG with sinus tachy, BAE, new T  wave inversion in aVL. Trop neg. CBC showing mildly elevated WBC but consistent with prior labs. Baseline anemia noted. Potassium bumped at 5.2 without EKG changes, serum Cr 4.5 (slightly worsened from baseline of ~3.4). CXR with stable cardiac enlargement. CP appears to be GI related, given pt's immunocompromised status, could represent esophagitis vs GERD. Will give GI cocktail and protonix with IVFs now and reassess.  6:04 PM Pt feeling improved. Discussed use of magic mouthwash (swish and swallow), and prilosec for her symptoms, and f/up with PCP for GI referral. Pt mentioning a rash on her L breast, appears to be yeast, will give nystatin cream. Discussed staying hydrated due to bumped kidney function, and call her nephrologist this week for an appointment. I explained the diagnosis and have given explicit precautions to return to the ER including for any other new or worsening symptoms. The patient understands and accepts the medical plan as it's been dictated and I have answered their questions. Discharge instructions concerning home care and prescriptions have been given. The patient is STABLE and is discharged to home in good condition.  BP 147/73 mmHg  Pulse 87  Temp(Src) 97.9 F (36.6 C)  Resp 18  Wt 149 lb (67.586 kg)  SpO2 100%  LMP 07/18/2012  Meds ordered this encounter  Medications  . gi cocktail (Maalox,Lidocaine,Donnatal)    Sig:   . pantoprazole (PROTONIX) injection 40 mg    Sig:   . sodium chloride 0.9 % bolus 1,000 mL    Sig:   . Alum & Mag Hydroxide-Simeth (MAGIC MOUTHWASH W/LIDOCAINE) SOLN    Sig: Take 5 mLs by mouth 3 (three) times daily as needed for mouth pain.    Dispense:  150 mL    Refill:  0    Order Specific Question:  Supervising Provider    Answer:  Johnna Acosta [  [id number]]  . omeprazole (PRILOSEC) 20 MG capsule    Sig: Take 1 capsule (20 mg total) by mouth daily.    Dispense:  30 capsule    Refill:  0    Order Specific Question:  Supervising Provider     Answer:  Noemi Chapel D [[id number]]  . nystatin-triamcinolone ointment (MYCOLOG)    Sig: Apply 1 application topically 2 (two) times daily.    Dispense:  30 g    Refill:  0    Order Specific Question:  Supervising Provider    Answer:  Johnna Acosta [address] [place], PA-C 11/06/14 1805  Carmin Muskrat, MD 11/06/14 [phone]

## 2014-11-06 NOTE — Telephone Encounter (Signed)
Debra from [facility] called - inquiring about a PCP for this patient.  I let her know that she does not have one at this time but we will encourage her to get one at her first office visit on 11/17/14

## 2014-11-06 NOTE — ED Notes (Signed)
Pt reports chest pain and dysphasia. Reports she has also been having some SOB. States that she was just in the hospital for lupus and a stroke

## 2014-11-06 NOTE — Discharge Instructions (Signed)
Your chest pain seems to be related to gastrointestinal issues, such as esophagitis or reflux. Start taking prilosec and using magic mouthwash to help with this discomfort, and see your regular doctor for a referral to a gastroenterologist [facility]) for a possible endoscopy. Use the ointment on your breast rash to help with this possible yeast infection. Stay well hydrated and see your kidney specialist this week for ongoing management of your kidney disease. Return to the ER for changes or worsening in your symptoms.   Dysphagia Swallowing problems (dysphagia) occur when solids and liquids seem to stick in your throat on the way down to your stomach, or the food takes longer to get to the stomach. Other symptoms include regurgitating food, noises coming from the throat, chest discomfort with swallowing, and a feeling of fullness or the feeling of something being stuck in your throat when swallowing. When blockage in your throat is complete, it may be associated with drooling. CAUSES  Problems with swallowing may occur because of problems with the muscles. The food cannot be propelled in the usual manner into your stomach. You may have ulcers, scar tissue, or inflammation in the tube down which food travels from your mouth to your stomach (esophagus), which blocks food from passing normally into the stomach. Causes of inflammation include:  Acid reflux from your stomach into your esophagus.  Infection.  Radiation treatment for cancer.  Medicines taken without enough fluids to wash them down into your stomach. You may have nerve problems that prevent signals from being sent to the muscles of your esophagus to contract and move your food down to your stomach. Globus pharyngeus is a relatively common problem in which there is a sense of an obstruction or difficulty in swallowing, without any physical abnormalities of the swallowing passages being found. This problem usually improves over  time with reassurance and testing to rule out other causes. DIAGNOSIS Dysphagia can be diagnosed and its cause can be determined by tests in which you swallow a white substance that helps illuminate the inside of your throat (contrast medium) while X-rays are taken. Sometimes a flexible telescope that is inserted down your throat (endoscopy) to look at your esophagus and stomach is used. TREATMENT   If the dysphagia is caused by acid reflux or infection, medicines may be used.  If the dysphagia is caused by problems with your swallowing muscles, swallowing therapy may be used to help you strengthen your swallowing muscles.  If the dysphagia is caused by a blockage or mass, procedures to remove the blockage may be done. HOME CARE INSTRUCTIONS  Try to eat soft food that is easier to swallow and check your weight on a daily basis to be sure that it is not decreasing.  Be sure to drink liquids when sitting upright (not lying down). SEEK MEDICAL CARE IF:  You are losing weight because you are unable to swallow.  You are coughing when you drink liquids (aspiration).  You are coughing up partially digested food. SEEK IMMEDIATE MEDICAL CARE IF:  You are unable to swallow your own saliva .  You are having shortness of breath or a fever, or both.  You have a hoarse voice along with difficulty swallowing. MAKE SURE YOU:  Understand these instructions.  Will watch your condition.  Will get help right away if you are not doing well or get worse. Document Released: 10/17/2000 Document Revised: 03/06/2014 Document Reviewed: 04/08/2013 [facility] Patient Information 2015 [place], [place]. This information is not intended to replace  advice given to you by your health care provider. Make sure you discuss any questions you have with your health care provider.  Food Basics for Chronic Kidney Disease When your kidneys are not working well, they cannot remove waste and excess substances from your  blood as effectively as they did before. This can lead to a buildup and imbalance of these substances, which can affect how your body functions. This buildup can also make your kidneys work harder, causing even more damage. You may need to eat less of certain foods that can lead to the buildup of these substances in your body. By making the changes to your diet that are recommended by your dietitian or health care provider, you could possibly help prevent further kidney damage and delay or prevent the need for dialysis. The following information can help give you a basic understanding of these substances and how they affect your bodily functions. The information also gives examples of foods that contain the highest amounts of these substances. WHAT DO I NEED TO KNOW ABOUT SUBSTANCES IN MY FOOD THAT I MAY NEED TO ADJUST? Food adjustments will be different for each person with chronic kidney disease. It is important that you see a dietitian who can help you determine the specific adjustments that you will need to make for each of the following substances: Potassium Potassium affects how steadily your heart beats. If too much potassium builds up in your blood, it can cause an irregular heartbeat or even a heart attack. Examples of foods rich in potassium include:  Milk.  Fruits.  Vegetables. Phosphorus Phosphorus is a mineral found in your bones. A balance between calcium and phosphorous is needed to build and maintain healthy bones. Too much phosphorus pulls calcium from your bones. This can make your bones weak and more likely to break. Too much phosphorus can also make your skin itch. Examples of foods rich in phosphorus include:  Milk and cheese.  Dried beans.  Peas.  Colas.  Nuts and peanut butter. Animal Protein Animal protein helps you make and keep muscle. It also helps in the repair of your body's cells and tissues. One of the natural breakdown products of protein is a waste product  called urea. When your kidneys are not working properly, they cannot remove wastes such as urea like they did before you developed chronic kidney disease. You will likely need to limit the amount of protein you eat to help prevent a buildup of urea in your blood. Examples of animal protein include:  Meat (all types).  Fish and seafood.  Poultry.  Eggs. Sodium Sodium, which is found in salt, helps maintain a healthy balance of fluids in your body. Too much sodium can increase your blood pressure level and have a negative affect on the function of your heart and lungs. Too much sodium also can cause your body to retain too much fluid, making your kidneys work harder. Examples of foods with high levels of sodium include:  Salt seasonings.  Soy sauce.  Cured and processed meats.  Salted crackers and snack foods.  Fast food.  Canned soups and most canned foods. Glucose Glucose provides energy for your body. If you have diabetes mellitus that is not properly controlled, you have too much glucose in your blood. Too much glucose in your blood can worsen the function of your kidneys by damaging small blood vessels. This prevents enough blood flow to your kidneys to give them what they need to work. If you have diabetes  mellitus and chronic kidney disease, it is important to maintain your blood glucose at a level recommended by your health care provider. SHOULD I TAKE A VITAMIN AND MINERAL SUPPLEMENT? Because you may need to avoid eating certain foods, you may not get all of the vitamins and minerals that would normally come from those foods. Your health care provider or dietitian may recommend that you take a supplement to ensure that you get all of the vitamins and minerals that your body needs.  Document Released: 01/10/2003 Document Revised: 03/06/2014 Document Reviewed: 09/16/2013 [facility] Patient Information 2015 [place], [place]. This information is not intended to replace advice given to you  by your health care provider. Make sure you discuss any questions you have with your health care provider.  Chest Pain (Nonspecific) It is often hard to give a diagnosis for the cause of chest pain. There is always a chance that your pain could be related to something serious, such as a heart attack or a blood clot in the lungs. You need to follow up with your doctor. HOME CARE  If antibiotic medicine was given, take it as directed by your doctor. Finish the medicine even if you start to feel better.  For the next few days, avoid activities that bring on chest pain. Continue physical activities as told by your doctor.  Do not use any tobacco products. This includes cigarettes, chewing tobacco, and e-cigarettes.  Avoid drinking alcohol.  Only take medicine as told by your doctor.  Follow your doctor's suggestions for more testing if your chest pain does not go away.  Keep all doctor visits you made. GET HELP IF:  Your chest pain does not go away, even after treatment.  You have a rash with blisters on your chest.  You have a fever. GET HELP RIGHT AWAY IF:   You have more pain or pain that spreads to your arm, neck, jaw, back, or belly (abdomen).  You have shortness of breath.  You cough more than usual or cough up blood.  You have very bad back or belly pain.  You feel sick to your stomach (nauseous) or throw up (vomit).  You have very bad weakness.  You pass out (faint).  You have chills. This is an emergency. Do not wait to see if the problems will go away. Call your local emergency services (911 in [place] Do not drive yourself to the hospital. MAKE SURE YOU:   Understand these instructions.  Will watch your condition.  Will get help right away if you are not doing well or get worse. Document Released: 04/07/2008 Document Revised: 10/25/2013 Document Reviewed: 04/07/2008 [facility] Patient Information 2015 [place], [place]. This information is not intended to replace  advice given to you by your health care provider. Make sure you discuss any questions you have with your health care provider.

## 2014-11-06 NOTE — ED Notes (Signed)
Patient transported to X-ray 

## 2014-11-10 ENCOUNTER — Ambulatory Visit (INDEPENDENT_AMBULATORY_CARE_PROVIDER_SITE_OTHER): Payer: PRIVATE HEALTH INSURANCE | Admitting: Family Medicine

## 2014-11-10 VITALS — BP 130/65 | HR 90 | Temp 98.6°F | Resp 18 | Ht 66.0 in | Wt 131.0 lb

## 2014-11-10 DIAGNOSIS — N189 Chronic kidney disease, unspecified: Secondary | ICD-10-CM

## 2014-11-10 DIAGNOSIS — M792 Neuralgia and neuritis, unspecified: Secondary | ICD-10-CM

## 2014-11-10 DIAGNOSIS — I69359 Hemiplegia and hemiparesis following cerebral infarction affecting unspecified side: Secondary | ICD-10-CM

## 2014-11-10 DIAGNOSIS — I69959 Hemiplegia and hemiparesis following unspecified cerebrovascular disease affecting unspecified side: Secondary | ICD-10-CM

## 2014-11-10 NOTE — Patient Instructions (Signed)
We may well be able to try a medication called gabapentin for your arm pain.  Let me know see how your kidney function looks and then we can decide on this.   Take care and please see me for recheck in about 1 month

## 2014-11-10 NOTE — Progress Notes (Addendum)
[facility] [address], [place] [address] [phone]  Date:  11/10/2014   Name:  [name] [name]   DOB:  November 04, 1960   MRN:  [id number]  PCP:  No primary care provider on file.    Chief Complaint: Follow-up   History of Present Illness:  [name] [name] is a 54 y.o. very pleasant female patient who presents with the following:  Here today for follow-up of stroke. She was originally admitted on 11/26 with hypertensive urgency, acute left sided weakness and numbness.  She spent two periods in inpt rehab- finished rehab for the second time on 12/23.  She is living with her 3 children.   She is getting PT, OT and speech therapies now at home.   She is on regular diet, is able to swallow normally. Is using a walker to ambulate in her home  History of CKD- she is a pt of Dr. [name]- her creat had gone up at her last visit to the ER- she was seen because she was not able to swallow due to odonophagia.  This was treated and she now feels better.  However noted that her creat was at approx 4.5 when she normally runs about 3.2  One of her main concerns today is a persistent "numbing, burning" feeling in her left hand.  This has been present since her recent stroke and she is not sure what to do about it. It is really bothering her.   BP Readings from Last 3 Encounters:  11/10/14 148/62  11/06/14 122/83  10/25/14 160/65     Patient Active Problem List   Diagnosis Date Noted  . Acute blood loss anemia   . Acute on chronic renal failure   . Hyponatremia   . Hyperglycemia   . MCTD (mixed connective tissue disease)   . SLE (systemic lupus erythematosus)   . Secondary cardiomyopathy   . Noncompliance with medication regimen   . Tobacco abuse   . Sepsis 10/10/2014  . Leucocytosis 10/10/2014  . Fluid overload 10/10/2014  . Chronic kidney disease 10/10/2014  . SIRS (systemic inflammatory response syndrome) 10/10/2014  . Acute renal failure superimposed on stage 4  chronic kidney disease 10/10/2014  . Anemia 10/10/2014  . Hyperkalemia 10/10/2014  . Acute encephalopathy 10/10/2014  . Membranous glomerulonephritis   . Torticollis, acquired 10/06/2014  . Essential hypertension 10/05/2014  . Acute respiratory acidosis   . ICH (intracerebral hemorrhage)   . Chronic ankle pain   . Cerebral hemorrhage   . Malignant hypertension   . Stroke 09/28/2014  . 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 Hypertensive emergency 09/28/2014  . Iron deficiency anemia 02/18/2008  . 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 ADRENAL INSUFFICIENCY, HX OF 02/18/2008  . DIABETES MELLITUS, TYPE II 10/25/2007  . HYPERLIPIDEMIA 10/25/2007  . UNSPECIFIED ULCERATION OF VULVA 10/25/2007  . Systemic lupus erythematosus 10/25/2007  . Rheumatoid arthritis 10/25/2007  . HYPERTENSION NEC 10/25/2007  . COLONIC POLYPS, HYPERPLASTIC 04/08/2006  . HEMORRHOIDS 04/08/2006    Past Medical History  Diagnosis Date  . Hypertension   . Lupus   . CKD (chronic kidney disease)     due to lupus/Dr. [name]  . Diabetes mellitus   . Stenosis of cervical spine region     with HNP at C5/6, C6/7  . Metatarsal bone fracture right    4th  . 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  Arthritis     RA  . Anemia   . Stroke     Past Surgical History  Procedure Laterality Date  . Breast biopsy      History  Substance Use Topics  . Smoking status: Former Smoker -- 0.50 packs/day for 30 years    Types: Cigarettes  . Smokeless tobacco: Never Used  . Alcohol Use: No    Family History  Problem Relation Age of Onset  . Hypertension    . Lupus    . Rheum arthritis    . Hypertension Mother   . Diabetes Mother   . Hypertension Sister   . Diabetes Father   . Hypertension Maternal Grandmother     Allergies  Allergen Reactions  . Sulfa Antibiotics Itching    Medication list has been reviewed and updated.  Current  Outpatient Prescriptions on File Prior to Visit  Medication Sig Dispense Refill  . acetaminophen (TYLENOL) 325 MG tablet Take 2 tablets (650 mg total) by mouth every 4 (four) hours as needed for mild pain.    Marland Kitchen ALPRAZolam (XANAX) 0.5 MG tablet Take 1 tablet (0.5 mg total) by mouth 2 (two) times daily as needed for anxiety. 15 tablet 0  . Alum & Mag Hydroxide-Simeth (MAGIC MOUTHWASH W/LIDOCAINE) SOLN Take 5 mLs by mouth 3 (three) times daily as needed for mouth pain. 150 mL 0  . amLODipine (NORVASC) 10 MG tablet Take 1 tablet (10 mg total) by mouth daily. 30 tablet 0  . atorvastatin (LIPITOR) 20 MG tablet Take 1 tablet (20 mg total) by mouth daily at 6 PM. 30 tablet 1  . cloNIDine (CATAPRES) 0.3 MG tablet Take 1 tablet (0.3 mg total) by mouth 3 (three) times daily. 90 tablet 1  . diclofenac sodium (VOLTAREN) 1 % GEL Apply 2 g topically 4 (four) times daily. To forefoot 4 Tube 0  . ferrous sulfate 325 (65 FE) MG tablet Take 1 tablet (325 mg total) by mouth daily with breakfast. 30 tablet 1  . furosemide (LASIX) 80 MG tablet Take 1 tablet (80 mg total) by mouth 2 (two) times daily. 60 tablet 1  . hydrALAZINE (APRESOLINE) 25 MG tablet Take 1 tablet (25 mg total) by mouth every 8 (eight) hours. 90 tablet 1  . hydroxychloroquine (PLAQUENIL) 200 MG tablet Take 2 tablets (400 mg total) by mouth daily. 30 tablet 1  . isosorbide mononitrate (IMDUR) 60 MG 24 hr tablet Take 1 tablet (60 mg total) by mouth daily. 30 tablet 0  . labetalol (NORMODYNE) 200 MG tablet Take 2 tablets (400 mg total) by mouth 2 (two) times daily. 120 tablet 0  . Menthol-Methyl Salicylate (MUSCLE RUB) 10-15 % CREA Apply 1 application topically 3 (three) times daily before meals. 85 g 0  . multivitamin (RENA-VIT) TABS tablet Take 1 tablet by mouth daily.    . mycophenolate (CELLCEPT) 250 MG capsule Take 4 capsules (1,000 mg total) by mouth 2 (two) times daily. 320 capsule 1  . nicotine (NICODERM CQ - DOSED IN MG/24 HOURS) 14 mg/24hr  patch Place 1 patch (14 mg total) onto the skin daily. 28 patch 0  . nystatin-triamcinolone ointment (MYCOLOG) Apply 1 application topically 2 (two) times daily. 30 g 0  . omeprazole (PRILOSEC) 20 MG capsule Take 1 capsule (20 mg total) by mouth daily. 30 capsule 0  . pantoprazole (PROTONIX) 40 MG tablet Take 1 tablet (40 mg total) by mouth 2 (two) times daily. 60 tablet 1  . predniSONE (DELTASONE) 10 MG tablet Take two  pills daily for three day. Then decrease to one pill X 3 days. Then decrease to 1/2 pill daily till gone. 11 tablet 0  . senna-docusate (SENOKOT-S) 8.6-50 MG per tablet Take 1 tablet by mouth 2 (two) times daily. 60 tablet 1  . sodium bicarbonate 650 MG tablet Take 2 tablets (1,300 mg total) by mouth 2 (two) times daily. 120 tablet 1  . Vitamin D, Ergocalciferol, (DRISDOL) 50000 UNITS CAPS capsule Take 1 capsule (50,000 Units total) by mouth every Wednesday. 4 capsule 1   No current facility-administered medications on file prior to visit.    Review of Systems:  As per HPI- otherwise negative.   Physical Examination: Filed Vitals:   11/10/14 1713  BP: 148/62  Pulse: 102  Temp: 98.6 F (37 C)  Resp: 18   Filed Vitals:   11/10/14 1713  Height: 5\' 6"  (1.676 m)  Weight: 131 lb (59.421 kg)   Body mass index is 21.15 kg/(m^2). Ideal Body Weight: Weight in (lb) to have BMI = 25: 154.6  GEN: WDWN, NAD, Non-toxic, A & O x 3, appears older than stated age 55: Atraumatic, Normocephalic. Neck supple. No masses, No LAD. Ears and Nose: No external deformity. CV: RRR, No M/G/R. No JVD. No thrill. No extra heart sounds. PULM: CTA B, no wheezes, crackles, rhonchi. No retractions. No resp. distress. No accessory muscle use. ABD: S, NT, ND, +BS. No rebound. No HSM. EXTR: No c/c/e NEURO uses a walker for support.  Normal facial movement and speech.  Diffuse weakness of all extremiteis  PSYCH: Normally interactive. Conversant. Not depressed or anxious appearing.  Calm  demeanor.    Assessment and Plan: Chronic renal failure, unspecified stage - Plan: Basic metabolic panel  History of hemorrhagic stroke with residual hemiplegia  Follow-up visit today after recent stroke Her BP is controlled Will recheck BMP to see if her renal function is back to baseline Once creat clearance is available we can think about gabapentin for her hand pain  Signed Lamar Blinks, MD  addnd 1/11: creat clearance is 20.  She can take 200- 700 mg of gabapentin daily.  Called back and spoke to a female relative who had Terilynn close to the phone. She would like to start on gabapentin in hopes of improving her hand pain.  Will have her start on 100qd, and work up to TID over a week or so.   The relative also mentioned that [name] [name]'s ears are hurting and that anything she eats seems to "go right through her."  Explained that she had not raised these concerns to me so I am not sure of the cause but we are glad to see her for a recheck any time.  Also explained that she needs a repeat BMP in 1-2 weeks for stable mild hyponatremia.  Her creatinine is closer to her baseline Results for orders placed or performed in visit on 27/03/50  Basic metabolic panel  Result Value Ref Range   Sodium 131 (L) 135 - 145 mEq/L   Potassium 4.5 3.5 - 5.3 mEq/L   Chloride 100 96 - 112 mEq/L   CO2 18 (L) 19 - 32 mEq/L   Glucose, Bld 173 (H) 70 - 99 mg/dL   BUN 63 (H) 6 - 23 mg/dL   Creat 3.49 (H) 0.50 - 1.10 mg/dL   Calcium 7.8 (L) 8.4 - 10.5 mg/dL

## 2014-11-11 LAB — BASIC METABOLIC PANEL
BUN: 63 mg/dL — AB (ref 6–23)
CHLORIDE: 100 meq/L (ref 96–112)
CO2: 18 meq/L — AB (ref 19–32)
CREATININE: 3.49 mg/dL — AB (ref 0.50–1.10)
Calcium: 7.8 mg/dL — ABNORMAL LOW (ref 8.4–10.5)
GLUCOSE: 173 mg/dL — AB (ref 70–99)
Potassium: 4.5 mEq/L (ref 3.5–5.3)
Sodium: 131 mEq/L — ABNORMAL LOW (ref 135–145)

## 2014-11-13 ENCOUNTER — Telehealth: Payer: Self-pay

## 2014-11-13 MED ORDER — GABAPENTIN 100 MG PO CAPS: 100.0000 mg | ORAL_CAPSULE | Freq: Three times a day (TID) | ORAL | Status: DC

## 2014-11-13 NOTE — Telephone Encounter (Signed)
Patient states that she is expecting a script for Gabapentin for her hand (says she is in a lot of pain). She wants to know how quickly this med will be called in. [facility]   [phone]

## 2014-11-13 NOTE — Telephone Encounter (Signed)
Pt calling in regards to lab. Please advise. Thanks

## 2014-11-16 ENCOUNTER — Telehealth: Payer: Self-pay

## 2014-11-16 NOTE — Telephone Encounter (Signed)
[name] [name] at [facility] requesting to speak to a nurse or provider regarding patients BP medication. Her call back number is [phone]

## 2014-11-17 NOTE — Telephone Encounter (Signed)
Lm for [name] [name] to rtn call.

## 2014-11-17 NOTE — Telephone Encounter (Signed)
Pt had her BP taken yesterday around noon- 115/62 pt states that after taking her amlodipine. It makes her feel weaker and not wanting to do anything she is very fatigued. Pt has advised her PT that she is no longer going to take amlodipine. Is this acceptable and do you want to have pt RTC- she was just here on 1/8.   Please advise. Either pt or Ebony Hail

## 2014-11-17 NOTE — Telephone Encounter (Signed)
Called and LMOM.  Suggested that she try taking a 1/2 tablet of her amlodipine prior to stopping altogether, as it seems like HTN contributed to her recent stroke.  Of course she is free to make her own decision regarding this medication.  Please come and see me soon for a recheck/ labs

## 2014-11-18 ENCOUNTER — Encounter (HOSPITAL_COMMUNITY): Payer: Self-pay | Admitting: *Deleted

## 2014-11-18 ENCOUNTER — Telehealth: Payer: Self-pay | Admitting: Family Medicine

## 2014-11-18 ENCOUNTER — Inpatient Hospital Stay (HOSPITAL_COMMUNITY)
Admission: EM | Admit: 2014-11-18 | Discharge: 2014-11-25 | DRG: 377 | Disposition: A | Discharge status: Home Health | Payer: Managed Care, Other (non HMO) | Attending: Internal Medicine | Admitting: Internal Medicine

## 2014-11-18 DIAGNOSIS — K5900 Constipation, unspecified: Secondary | ICD-10-CM | POA: Diagnosis present

## 2014-11-18 DIAGNOSIS — Z8673 Personal history of transient ischemic attack (TIA), and cerebral infarction without residual deficits: Secondary | ICD-10-CM

## 2014-11-18 DIAGNOSIS — T380X5A Adverse effect of glucocorticoids and synthetic analogues, initial encounter: Secondary | ICD-10-CM | POA: Diagnosis present

## 2014-11-18 DIAGNOSIS — M069 Rheumatoid arthritis, unspecified: Secondary | ICD-10-CM | POA: Diagnosis present

## 2014-11-18 DIAGNOSIS — R109 Unspecified abdominal pain: Secondary | ICD-10-CM | POA: Diagnosis present

## 2014-11-18 DIAGNOSIS — D62 Acute posthemorrhagic anemia: Secondary | ICD-10-CM | POA: Diagnosis present

## 2014-11-18 DIAGNOSIS — K921 Melena: Secondary | ICD-10-CM | POA: Diagnosis not present

## 2014-11-18 DIAGNOSIS — E872 Acidosis: Secondary | ICD-10-CM | POA: Diagnosis present

## 2014-11-18 DIAGNOSIS — Z720 Tobacco use: Secondary | ICD-10-CM | POA: Diagnosis present

## 2014-11-18 DIAGNOSIS — D649 Anemia, unspecified: Secondary | ICD-10-CM

## 2014-11-18 DIAGNOSIS — R251 Tremor, unspecified: Secondary | ICD-10-CM

## 2014-11-18 DIAGNOSIS — D509 Iron deficiency anemia, unspecified: Secondary | ICD-10-CM | POA: Diagnosis present

## 2014-11-18 DIAGNOSIS — N189 Chronic kidney disease, unspecified: Secondary | ICD-10-CM

## 2014-11-18 DIAGNOSIS — E785 Hyperlipidemia, unspecified: Secondary | ICD-10-CM | POA: Diagnosis present

## 2014-11-18 DIAGNOSIS — I129 Hypertensive chronic kidney disease with stage 1 through stage 4 chronic kidney disease, or unspecified chronic kidney disease: Secondary | ICD-10-CM | POA: Diagnosis present

## 2014-11-18 DIAGNOSIS — I69198 Other sequelae of nontraumatic intracerebral hemorrhage: Secondary | ICD-10-CM

## 2014-11-18 DIAGNOSIS — Z87891 Personal history of nicotine dependence: Secondary | ICD-10-CM

## 2014-11-18 DIAGNOSIS — Z7952 Long term (current) use of systemic steroids: Secondary | ICD-10-CM

## 2014-11-18 DIAGNOSIS — M792 Neuralgia and neuritis, unspecified: Secondary | ICD-10-CM

## 2014-11-18 DIAGNOSIS — D631 Anemia in chronic kidney disease: Secondary | ICD-10-CM | POA: Diagnosis present

## 2014-11-18 DIAGNOSIS — Z882 Allergy status to sulfonamides status: Secondary | ICD-10-CM

## 2014-11-18 DIAGNOSIS — M329 Systemic lupus erythematosus, unspecified: Secondary | ICD-10-CM | POA: Diagnosis present

## 2014-11-18 DIAGNOSIS — G629 Polyneuropathy, unspecified: Secondary | ICD-10-CM | POA: Diagnosis present

## 2014-11-18 DIAGNOSIS — E1165 Type 2 diabetes mellitus with hyperglycemia: Secondary | ICD-10-CM | POA: Diagnosis present

## 2014-11-18 DIAGNOSIS — N052 Unspecified nephritic syndrome with diffuse membranous glomerulonephritis: Secondary | ICD-10-CM | POA: Diagnosis present

## 2014-11-18 DIAGNOSIS — N184 Chronic kidney disease, stage 4 (severe): Secondary | ICD-10-CM

## 2014-11-18 DIAGNOSIS — K529 Noninfective gastroenteritis and colitis, unspecified: Secondary | ICD-10-CM

## 2014-11-18 DIAGNOSIS — E43 Unspecified severe protein-calorie malnutrition: Secondary | ICD-10-CM | POA: Diagnosis present

## 2014-11-18 DIAGNOSIS — B379 Candidiasis, unspecified: Secondary | ICD-10-CM | POA: Diagnosis not present

## 2014-11-18 DIAGNOSIS — I619 Nontraumatic intracerebral hemorrhage, unspecified: Secondary | ICD-10-CM | POA: Diagnosis present

## 2014-11-18 DIAGNOSIS — E871 Hypo-osmolality and hyponatremia: Secondary | ICD-10-CM | POA: Diagnosis present

## 2014-11-18 DIAGNOSIS — K219 Gastro-esophageal reflux disease without esophagitis: Secondary | ICD-10-CM | POA: Diagnosis present

## 2014-11-18 DIAGNOSIS — R197 Diarrhea, unspecified: Secondary | ICD-10-CM | POA: Diagnosis not present

## 2014-11-18 DIAGNOSIS — K922 Gastrointestinal hemorrhage, unspecified: Secondary | ICD-10-CM

## 2014-11-18 DIAGNOSIS — Z8639 Personal history of other endocrine, nutritional and metabolic disease: Secondary | ICD-10-CM

## 2014-11-18 DIAGNOSIS — J189 Pneumonia, unspecified organism: Secondary | ICD-10-CM | POA: Diagnosis present

## 2014-11-18 DIAGNOSIS — I10 Essential (primary) hypertension: Secondary | ICD-10-CM | POA: Diagnosis present

## 2014-11-18 DIAGNOSIS — K559 Vascular disorder of intestine, unspecified: Secondary | ICD-10-CM | POA: Diagnosis present

## 2014-11-18 DIAGNOSIS — R509 Fever, unspecified: Secondary | ICD-10-CM

## 2014-11-18 DIAGNOSIS — Z79899 Other long term (current) drug therapy: Secondary | ICD-10-CM

## 2014-11-18 HISTORY — DX: Paresthesia of skin: R20.2

## 2014-11-18 HISTORY — DX: Pain in unspecified joint: M25.50

## 2014-11-18 LAB — CBC WITH DIFFERENTIAL/PLATELET
BASOS ABS: 0 10*3/uL (ref 0.0–0.1)
Basophils Relative: 0 % (ref 0–1)
Eosinophils Absolute: 0 10*3/uL (ref 0.0–0.7)
Eosinophils Relative: 0 % (ref 0–5)
HCT: 16.9 % — ABNORMAL LOW (ref 36.0–46.0)
Hemoglobin: 5.4 g/dL — CL (ref 12.0–15.0)
LYMPHS PCT: 11 % — AB (ref 12–46)
Lymphs Abs: 0.6 10*3/uL — ABNORMAL LOW (ref 0.7–4.0)
MCH: 26.6 pg (ref 26.0–34.0)
MCHC: 32 g/dL (ref 30.0–36.0)
MCV: 83.3 fL (ref 78.0–100.0)
MONOS PCT: 8 % (ref 3–12)
Monocytes Absolute: 0.4 10*3/uL (ref 0.1–1.0)
NEUTROS ABS: 4.5 10*3/uL (ref 1.7–7.7)
NEUTROS PCT: 81 % — AB (ref 43–77)
Platelets: 245 10*3/uL (ref 150–400)
RBC: 2.03 MIL/uL — ABNORMAL LOW (ref 3.87–5.11)
RDW: 14.1 % (ref 11.5–15.5)
WBC: 5.6 10*3/uL (ref 4.0–10.5)

## 2014-11-18 MED ORDER — SODIUM CHLORIDE 0.9 % IV SOLN
INTRAVENOUS | Status: DC
  Administered 2014-11-19 (×2): via INTRAVENOUS

## 2014-11-18 NOTE — Telephone Encounter (Signed)
Called and spoke to her sister. I met [name] [name] once about 8 days ago when she was recovering from her acute stroke.  We had planned to see her back for a lab recheck.  Her sister reports that [name] [name] is having dark watery stools and seems weak. This has been going on for a few days.  Advised her that as [name] [name] was fragile to being with I think she should take her to the ER now; there is not a lot I can help with over the phone, she needs eval and labs.  Her sister agreed with the plan and will take her for further care.

## 2014-11-18 NOTE — ED Notes (Signed)
The pt has not had an appetite for  3 weeks.  For the past 4-5 days the pt has had  Diarrhea and is c/o leg pain

## 2014-11-19 ENCOUNTER — Emergency Department (HOSPITAL_COMMUNITY): Payer: Managed Care, Other (non HMO)

## 2014-11-19 ENCOUNTER — Inpatient Hospital Stay (HOSPITAL_COMMUNITY): Payer: Managed Care, Other (non HMO)

## 2014-11-19 DIAGNOSIS — Z7952 Long term (current) use of systemic steroids: Secondary | ICD-10-CM | POA: Diagnosis not present

## 2014-11-19 DIAGNOSIS — E872 Acidosis: Secondary | ICD-10-CM | POA: Diagnosis present

## 2014-11-19 DIAGNOSIS — Z8639 Personal history of other endocrine, nutritional and metabolic disease: Secondary | ICD-10-CM

## 2014-11-19 DIAGNOSIS — K219 Gastro-esophageal reflux disease without esophagitis: Secondary | ICD-10-CM | POA: Diagnosis present

## 2014-11-19 DIAGNOSIS — R109 Unspecified abdominal pain: Secondary | ICD-10-CM

## 2014-11-19 DIAGNOSIS — M329 Systemic lupus erythematosus, unspecified: Secondary | ICD-10-CM

## 2014-11-19 DIAGNOSIS — Z8673 Personal history of transient ischemic attack (TIA), and cerebral infarction without residual deficits: Secondary | ICD-10-CM

## 2014-11-19 DIAGNOSIS — D509 Iron deficiency anemia, unspecified: Secondary | ICD-10-CM | POA: Diagnosis present

## 2014-11-19 DIAGNOSIS — Z87891 Personal history of nicotine dependence: Secondary | ICD-10-CM | POA: Diagnosis not present

## 2014-11-19 DIAGNOSIS — E43 Unspecified severe protein-calorie malnutrition: Secondary | ICD-10-CM | POA: Diagnosis present

## 2014-11-19 DIAGNOSIS — R269 Unspecified abnormalities of gait and mobility: Secondary | ICD-10-CM | POA: Diagnosis not present

## 2014-11-19 DIAGNOSIS — K559 Vascular disorder of intestine, unspecified: Secondary | ICD-10-CM | POA: Diagnosis present

## 2014-11-19 DIAGNOSIS — E785 Hyperlipidemia, unspecified: Secondary | ICD-10-CM | POA: Diagnosis present

## 2014-11-19 DIAGNOSIS — I69398 Other sequelae of cerebral infarction: Secondary | ICD-10-CM | POA: Diagnosis not present

## 2014-11-19 DIAGNOSIS — Z79899 Other long term (current) drug therapy: Secondary | ICD-10-CM | POA: Diagnosis not present

## 2014-11-19 DIAGNOSIS — M6281 Muscle weakness (generalized): Secondary | ICD-10-CM | POA: Diagnosis not present

## 2014-11-19 DIAGNOSIS — N184 Chronic kidney disease, stage 4 (severe): Secondary | ICD-10-CM

## 2014-11-19 DIAGNOSIS — K5289 Other specified noninfective gastroenteritis and colitis: Secondary | ICD-10-CM

## 2014-11-19 DIAGNOSIS — K529 Noninfective gastroenteritis and colitis, unspecified: Secondary | ICD-10-CM

## 2014-11-19 DIAGNOSIS — D62 Acute posthemorrhagic anemia: Secondary | ICD-10-CM | POA: Diagnosis present

## 2014-11-19 DIAGNOSIS — E1165 Type 2 diabetes mellitus with hyperglycemia: Secondary | ICD-10-CM | POA: Diagnosis present

## 2014-11-19 DIAGNOSIS — G629 Polyneuropathy, unspecified: Secondary | ICD-10-CM | POA: Diagnosis present

## 2014-11-19 DIAGNOSIS — K921 Melena: Secondary | ICD-10-CM | POA: Diagnosis present

## 2014-11-19 DIAGNOSIS — D631 Anemia in chronic kidney disease: Secondary | ICD-10-CM | POA: Diagnosis present

## 2014-11-19 DIAGNOSIS — I10 Essential (primary) hypertension: Secondary | ICD-10-CM

## 2014-11-19 DIAGNOSIS — I6931 Cognitive deficits following cerebral infarction: Secondary | ICD-10-CM | POA: Diagnosis not present

## 2014-11-19 DIAGNOSIS — T380X5A Adverse effect of glucocorticoids and synthetic analogues, initial encounter: Secondary | ICD-10-CM | POA: Diagnosis present

## 2014-11-19 DIAGNOSIS — M069 Rheumatoid arthritis, unspecified: Secondary | ICD-10-CM

## 2014-11-19 DIAGNOSIS — E871 Hypo-osmolality and hyponatremia: Secondary | ICD-10-CM | POA: Diagnosis present

## 2014-11-19 DIAGNOSIS — B379 Candidiasis, unspecified: Secondary | ICD-10-CM | POA: Diagnosis not present

## 2014-11-19 DIAGNOSIS — I69198 Other sequelae of nontraumatic intracerebral hemorrhage: Secondary | ICD-10-CM | POA: Diagnosis not present

## 2014-11-19 DIAGNOSIS — I129 Hypertensive chronic kidney disease with stage 1 through stage 4 chronic kidney disease, or unspecified chronic kidney disease: Secondary | ICD-10-CM | POA: Diagnosis present

## 2014-11-19 DIAGNOSIS — J189 Pneumonia, unspecified organism: Secondary | ICD-10-CM | POA: Diagnosis present

## 2014-11-19 DIAGNOSIS — K5900 Constipation, unspecified: Secondary | ICD-10-CM | POA: Diagnosis present

## 2014-11-19 DIAGNOSIS — Z882 Allergy status to sulfonamides status: Secondary | ICD-10-CM | POA: Diagnosis not present

## 2014-11-19 DIAGNOSIS — R197 Diarrhea, unspecified: Secondary | ICD-10-CM | POA: Diagnosis present

## 2014-11-19 DIAGNOSIS — K922 Gastrointestinal hemorrhage, unspecified: Secondary | ICD-10-CM

## 2014-11-19 LAB — CBC
HCT: 15.4 % — ABNORMAL LOW (ref 36.0–46.0)
HCT: 21 % — ABNORMAL LOW (ref 36.0–46.0)
HCT: 22.9 % — ABNORMAL LOW (ref 36.0–46.0)
HEMATOCRIT: 23.2 % — AB (ref 36.0–46.0)
HEMOGLOBIN: 5 g/dL — AB (ref 12.0–15.0)
HEMOGLOBIN: 7 g/dL — AB (ref 12.0–15.0)
Hemoglobin: 7.7 g/dL — ABNORMAL LOW (ref 12.0–15.0)
Hemoglobin: 7.6 g/dL — ABNORMAL LOW (ref 12.0–15.0)
MCH: 27.6 pg (ref 26.0–34.0)
MCH: 27.8 pg (ref 26.0–34.0)
MCH: 27.7 pg (ref 26.0–34.0)
MCH: 28 pg (ref 26.0–34.0)
MCHC: 32.5 g/dL (ref 30.0–36.0)
MCHC: 33.2 g/dL (ref 30.0–36.0)
MCHC: 33.3 g/dL (ref 30.0–36.0)
MCHC: 33.2 g/dL (ref 30.0–36.0)
MCV: 85.1 fL (ref 78.0–100.0)
MCV: 83.8 fL (ref 78.0–100.0)
MCV: 83 fL (ref 78.0–100.0)
MCV: 84.5 fL (ref 78.0–100.0)
PLATELETS: 211 10*3/uL (ref 150–400)
Platelets: 206 10*3/uL (ref 150–400)
Platelets: 196 10*3/uL (ref 150–400)
Platelets: 201 10*3/uL (ref 150–400)
RBC: 1.81 MIL/uL — AB (ref 3.87–5.11)
RBC: 2.77 MIL/uL — ABNORMAL LOW (ref 3.87–5.11)
RBC: 2.53 MIL/uL — ABNORMAL LOW (ref 3.87–5.11)
RBC: 2.71 MIL/uL — ABNORMAL LOW (ref 3.87–5.11)
RDW: 14.2 % (ref 11.5–15.5)
RDW: 13.8 % (ref 11.5–15.5)
RDW: 14.2 % (ref 11.5–15.5)
RDW: 14.4 % (ref 11.5–15.5)
WBC: 5.3 10*3/uL (ref 4.0–10.5)
WBC: 4.4 10*3/uL (ref 4.0–10.5)
WBC: 4.8 10*3/uL (ref 4.0–10.5)
WBC: 4.4 10*3/uL (ref 4.0–10.5)

## 2014-11-19 LAB — BASIC METABOLIC PANEL
Anion gap: 13 (ref 5–15)
BUN: 55 mg/dL — ABNORMAL HIGH (ref 6–23)
CO2: 16 mmol/L — ABNORMAL LOW (ref 19–32)
Calcium: 7.3 mg/dL — ABNORMAL LOW (ref 8.4–10.5)
Chloride: 103 mEq/L (ref 96–112)
Creatinine, Ser: 3.24 mg/dL — ABNORMAL HIGH (ref 0.50–1.10)
GFR, EST AFRICAN AMERICAN: 18 mL/min — AB (ref >90)
GFR, EST NON AFRICAN AMERICAN: 15 mL/min — AB (ref >90)
Glucose, Bld: 97 mg/dL (ref 70–99)
Potassium: 4 mmol/L (ref 3.5–5.1)
Sodium: 132 mmol/L — ABNORMAL LOW (ref 135–145)

## 2014-11-19 LAB — URINALYSIS, ROUTINE W REFLEX MICROSCOPIC
Bilirubin Urine: NEGATIVE
Glucose, UA: NEGATIVE mg/dL
Hgb urine dipstick: NEGATIVE
Ketones, ur: NEGATIVE mg/dL
Leukocytes, UA: NEGATIVE
Nitrite: NEGATIVE
Protein, ur: 100 mg/dL — AB
SPECIFIC GRAVITY, URINE: 1.015 (ref 1.005–1.030)
Urobilinogen, UA: 0.2 mg/dL (ref 0.0–1.0)
pH: 5.5 (ref 5.0–8.0)

## 2014-11-19 LAB — TROPONIN I: Troponin I: <0.03 ng/mL (ref <0.031)

## 2014-11-19 LAB — COMPREHENSIVE METABOLIC PANEL
ALK PHOS: 77 U/L (ref 39–117)
ALT: 8 U/L (ref 0–35)
AST: 18 U/L (ref 0–37)
Albumin: 2.1 g/dL — ABNORMAL LOW (ref 3.5–5.2)
Anion gap: 14 (ref 5–15)
BUN: 59 mg/dL — ABNORMAL HIGH (ref 6–23)
CHLORIDE: 97 meq/L (ref 96–112)
CO2: 18 mmol/L — ABNORMAL LOW (ref 19–32)
Calcium: 7.7 mg/dL — ABNORMAL LOW (ref 8.4–10.5)
Creatinine, Ser: 3.57 mg/dL — ABNORMAL HIGH (ref 0.50–1.10)
GFR calc Af Amer: 16 mL/min — ABNORMAL LOW (ref >90)
GFR calc non Af Amer: 14 mL/min — ABNORMAL LOW (ref >90)
Glucose, Bld: 128 mg/dL — ABNORMAL HIGH (ref 70–99)
POTASSIUM: 4.5 mmol/L (ref 3.5–5.1)
Sodium: 129 mmol/L — ABNORMAL LOW (ref 135–145)
TOTAL PROTEIN: 6.2 g/dL (ref 6.0–8.3)
Total Bilirubin: 0.3 mg/dL (ref 0.3–1.2)

## 2014-11-19 LAB — LACTIC ACID, PLASMA: LACTIC ACID, VENOUS: 1 mmol/L (ref 0.5–2.2)

## 2014-11-19 LAB — PREPARE RBC (CROSSMATCH)

## 2014-11-19 LAB — APTT: aPTT: 43 seconds — ABNORMAL HIGH (ref 24–37)

## 2014-11-19 LAB — MRSA PCR SCREENING: MRSA by PCR: NEGATIVE

## 2014-11-19 LAB — URINE MICROSCOPIC-ADD ON

## 2014-11-19 LAB — GLUCOSE, CAPILLARY: GLUCOSE-CAPILLARY: 84 mg/dL (ref 70–99)

## 2014-11-19 LAB — PROTIME-INR
INR: 1.18 (ref 0.00–1.49)
Prothrombin Time: 15.1 seconds (ref 11.6–15.2)

## 2014-11-19 LAB — LIPASE, BLOOD: Lipase: 28 U/L (ref 11–59)

## 2014-11-19 LAB — POC OCCULT BLOOD, ED: FECAL OCCULT BLD: POSITIVE — AB

## 2014-11-19 LAB — CK: Total CK: 36 U/L (ref 7–177)

## 2014-11-19 MED ORDER — HYDROXYCHLOROQUINE SULFATE 200 MG PO TABS
400.0000 mg | ORAL_TABLET | Freq: Every day | ORAL | Status: DC
  Administered 2014-11-19 – 2014-11-25 (×7): 400 mg via ORAL
  Filled 2014-11-19 (×7): qty 2

## 2014-11-19 MED ORDER — FERROUS SULFATE 325 (65 FE) MG PO TABS
325.0000 mg | ORAL_TABLET | Freq: Every day | ORAL | Status: DC
  Administered 2014-11-19 – 2014-11-25 (×7): 325 mg via ORAL
  Filled 2014-11-19 (×8): qty 1

## 2014-11-19 MED ORDER — METRONIDAZOLE IN NACL 5-0.79 MG/ML-% IV SOLN
500.0000 mg | Freq: Three times a day (TID) | INTRAVENOUS | Status: DC
  Administered 2014-11-19 – 2014-11-21 (×7): 500 mg via INTRAVENOUS
  Filled 2014-11-19 (×11): qty 100

## 2014-11-19 MED ORDER — FENTANYL CITRATE 0.05 MG/ML IJ SOLN: 25.0000 ug | INTRAMUSCULAR | Status: DC | PRN

## 2014-11-19 MED ORDER — ACETAMINOPHEN 500 MG PO TABS
1000.0000 mg | ORAL_TABLET | Freq: Once | ORAL | Status: AC
  Administered 2014-11-19: 1000 mg via ORAL
  Filled 2014-11-19: qty 2

## 2014-11-19 MED ORDER — SODIUM CHLORIDE 0.9 % IJ SOLN
3.0000 mL | Freq: Two times a day (BID) | INTRAMUSCULAR | Status: DC
  Administered 2014-11-19 (×2): 3 mL via INTRAVENOUS
  Administered 2014-11-20: 10 mL via INTRAVENOUS
  Administered 2014-11-21 – 2014-11-25 (×7): 3 mL via INTRAVENOUS

## 2014-11-19 MED ORDER — IOHEXOL 300 MG/ML  SOLN
25.0000 mL | INTRAMUSCULAR | Status: AC
  Administered 2014-11-19: 25 mL via ORAL

## 2014-11-19 MED ORDER — CIPROFLOXACIN IN D5W 400 MG/200ML IV SOLN
400.0000 mg | INTRAVENOUS | Status: DC
  Administered 2014-11-19 – 2014-11-21 (×3): 400 mg via INTRAVENOUS
  Filled 2014-11-19 (×4): qty 200

## 2014-11-19 MED ORDER — ISOSORBIDE MONONITRATE ER 60 MG PO TB24
60.0000 mg | ORAL_TABLET | Freq: Every day | ORAL | Status: DC
Start: 2014-11-19 — End: 2014-11-25
  Administered 2014-11-19 – 2014-11-25 (×7): 60 mg via ORAL
  Filled 2014-11-19 (×7): qty 1

## 2014-11-19 MED ORDER — PANTOPRAZOLE SODIUM 40 MG IV SOLR
40.0000 mg | Freq: Two times a day (BID) | INTRAVENOUS | Status: DC
  Administered 2014-11-19 – 2014-11-21 (×5): 40 mg via INTRAVENOUS
  Filled 2014-11-19 (×6): qty 40

## 2014-11-19 MED ORDER — SODIUM BICARBONATE 650 MG PO TABS
1300.0000 mg | ORAL_TABLET | Freq: Two times a day (BID) | ORAL | Status: DC
  Administered 2014-11-19 – 2014-11-25 (×13): 1300 mg via ORAL
  Filled 2014-11-19 (×14): qty 2

## 2014-11-19 MED ORDER — ONDANSETRON HCL 4 MG PO TABS: 4.0000 mg | ORAL_TABLET | Freq: Four times a day (QID) | ORAL | Status: DC | PRN

## 2014-11-19 MED ORDER — DICLOFENAC SODIUM 1 % TD GEL
2.0000 g | Freq: Four times a day (QID) | TRANSDERMAL | Status: DC
  Administered 2014-11-19 – 2014-11-25 (×18): 2 g via TOPICAL
  Filled 2014-11-19: qty 100

## 2014-11-19 MED ORDER — ATORVASTATIN CALCIUM 20 MG PO TABS
20.0000 mg | ORAL_TABLET | Freq: Every day | ORAL | Status: DC
  Administered 2014-11-19 – 2014-11-24 (×6): 20 mg via ORAL
  Filled 2014-11-19 (×7): qty 1

## 2014-11-19 MED ORDER — MORPHINE SULFATE 2 MG/ML IJ SOLN
2.0000 mg | INTRAMUSCULAR | Status: DC | PRN
  Administered 2014-11-21: 2 mg via INTRAVENOUS
  Filled 2014-11-19: qty 1

## 2014-11-19 MED ORDER — NYSTATIN-TRIAMCINOLONE 100000-0.1 UNIT/GM-% EX OINT
1.0000 "application " | TOPICAL_OINTMENT | Freq: Two times a day (BID) | CUTANEOUS | Status: DC
  Administered 2014-11-19 (×2): 1 via TOPICAL
  Filled 2014-11-19: qty 15

## 2014-11-19 MED ORDER — SODIUM CHLORIDE 0.9 % IV BOLUS (SEPSIS)
500.0000 mL | Freq: Once | INTRAVENOUS | Status: AC
  Administered 2014-11-19: 500 mL via INTRAVENOUS

## 2014-11-19 MED ORDER — FENTANYL CITRATE 0.05 MG/ML IJ SOLN
25.0000 ug | INTRAMUSCULAR | Status: DC | PRN
  Administered 2014-11-19: 25 ug via INTRAVENOUS
  Filled 2014-11-19: qty 2

## 2014-11-19 MED ORDER — MYCOPHENOLATE MOFETIL 250 MG PO CAPS
1000.0000 mg | ORAL_CAPSULE | Freq: Two times a day (BID) | ORAL | Status: DC
  Administered 2014-11-19 – 2014-11-25 (×13): 1000 mg via ORAL
  Filled 2014-11-19 (×14): qty 4

## 2014-11-19 MED ORDER — HYDRALAZINE HCL 20 MG/ML IJ SOLN
10.0000 mg | Freq: Four times a day (QID) | INTRAMUSCULAR | Status: DC | PRN
  Administered 2014-11-20: 10 mg via INTRAVENOUS
  Filled 2014-11-19: qty 1

## 2014-11-19 MED ORDER — SODIUM CHLORIDE 0.9 % IV SOLN: 10.0000 mL/h | Freq: Once | INTRAVENOUS | Status: DC

## 2014-11-19 MED ORDER — GABAPENTIN 100 MG PO CAPS
100.0000 mg | ORAL_CAPSULE | Freq: Three times a day (TID) | ORAL | Status: DC
  Administered 2014-11-19 – 2014-11-22 (×12): 100 mg via ORAL
  Filled 2014-11-19 (×15): qty 1

## 2014-11-19 MED ORDER — CLONIDINE HCL 0.3 MG PO TABS
0.3000 mg | ORAL_TABLET | Freq: Three times a day (TID) | ORAL | Status: DC
  Administered 2014-11-19 – 2014-11-25 (×19): 0.3 mg via ORAL
  Filled 2014-11-19 (×21): qty 1

## 2014-11-19 MED ORDER — RENA-VITE PO TABS
1.0000 | ORAL_TABLET | Freq: Every day | ORAL | Status: DC
  Administered 2014-11-19: 1 via ORAL
  Administered 2014-11-20: 11:00:00 via ORAL
  Administered 2014-11-21 – 2014-11-25 (×5): 1 via ORAL
  Filled 2014-11-19 (×7): qty 1

## 2014-11-19 MED ORDER — HYDRALAZINE HCL 25 MG PO TABS
25.0000 mg | ORAL_TABLET | Freq: Three times a day (TID) | ORAL | Status: DC
  Administered 2014-11-19 – 2014-11-20 (×3): 25 mg via ORAL
  Filled 2014-11-19 (×8): qty 1

## 2014-11-19 MED ORDER — AMLODIPINE BESYLATE 10 MG PO TABS
10.0000 mg | ORAL_TABLET | Freq: Every day | ORAL | Status: DC
  Administered 2014-11-20 – 2014-11-25 (×6): 10 mg via ORAL
  Filled 2014-11-19 (×8): qty 1

## 2014-11-19 MED ORDER — LABETALOL HCL 200 MG PO TABS
400.0000 mg | ORAL_TABLET | Freq: Two times a day (BID) | ORAL | Status: DC
  Administered 2014-11-19 – 2014-11-23 (×9): 400 mg via ORAL
  Filled 2014-11-19 (×10): qty 2

## 2014-11-19 MED ORDER — ACETAMINOPHEN 325 MG PO TABS: 650.0000 mg | ORAL_TABLET | ORAL | Status: DC | PRN

## 2014-11-19 MED ORDER — ONDANSETRON HCL 4 MG/2ML IJ SOLN
4.0000 mg | Freq: Four times a day (QID) | INTRAMUSCULAR | Status: DC | PRN
Start: 2014-11-19 — End: 2014-11-25
  Administered 2014-11-19: 4 mg via INTRAVENOUS
  Filled 2014-11-19: qty 2

## 2014-11-19 MED ORDER — MUSCLE RUB 10-15 % EX CREA
1.0000 "application " | TOPICAL_CREAM | Freq: Three times a day (TID) | CUTANEOUS | Status: DC
  Administered 2014-11-19 – 2014-11-25 (×12): 1 via TOPICAL
  Filled 2014-11-19: qty 85

## 2014-11-19 MED ORDER — ALPRAZOLAM 0.5 MG PO TABS
0.5000 mg | ORAL_TABLET | Freq: Two times a day (BID) | ORAL | Status: DC | PRN
  Administered 2014-11-24: 0.5 mg via ORAL
  Filled 2014-11-19: qty 1

## 2014-11-19 NOTE — ED Provider Notes (Signed)
CSN: [id number]     Arrival date & time 11/18/14  2256 History   First MD Initiated Contact with Patient 11/18/14 2353     Chief Complaint  Patient presents with  . Diarrhea      HPI  Pt was seen at [place] [facility]. Per pt and her family, c/o gradual onset and persistence of multiple intermittent episodes of diarrhea for the past 4 to 5 days. Describes the stools as "black" and "watery." Has been associated with nausea, poor PO intake, generalized abd "cramping," and well as generalized weakness/fatigue. Denies back pain, no vomiting, no fevers, no CP/SOB.    Past Medical History  Diagnosis Date  . Hypertension   . Lupus   . CKD (chronic kidney disease)     due to lupus/Dr. [name]  . Diabetes mellitus   . Stenosis of cervical spine region     with HNP at C5/6, C6/7  . Metatarsal bone fracture right    4th  . 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 Arthritis     RA  . Anemia   . Stroke 11/2014    left sided weakness, dysphagia  . Pain in joints   . Paresthesias    Past Surgical History  Procedure Laterality Date  . Breast biopsy     Family History  Problem Relation Age of Onset  . Hypertension    . Lupus    . Rheum arthritis    . Hypertension Mother   . Diabetes Mother   . Hypertension Sister   . Diabetes Father   . Hypertension Maternal Grandmother    History  Substance Use Topics  . Smoking status: Former Smoker -- 0.50 packs/day for 30 years    Types: Cigarettes  . Smokeless tobacco: Never Used  . Alcohol Use: No    Review of Systems ROS: Statement: All systems negative except as marked or noted in the HPI; Constitutional: Negative for fever and chills. ; ; Eyes: Negative for eye pain, redness and discharge. ; ; ENMT: Negative for ear pain, hoarseness, nasal congestion, sinus pressure and sore throat. ; ; Cardiovascular: Negative for chest pain, palpitations, diaphoresis, dyspnea and peripheral edema. ; ; Respiratory:  Negative for cough, wheezing and stridor. ; ; Gastrointestinal: +nausea, diarrhea. Negative for vomiting, abdominal pain, blood in stool, hematemesis, jaundice and rectal bleeding. . ; ; Genitourinary: Negative for dysuria, flank pain and hematuria. ; ; Musculoskeletal: +muscles cramping. Negative for back pain and neck pain. Negative for swelling and trauma.; ; Skin: Negative for pruritus, rash, abrasions, blisters, bruising and skin lesion.; ; Neuro: Negative for headache, lightheadedness and neck stiffness. Negative for weakness, altered level of consciousness , altered mental status, extremity weakness, paresthesias, involuntary movement, seizure and syncope.     Allergies  Sulfa antibiotics  Home Medications   Prior to Admission medications   Medication Sig Start Date End Date Taking? Authorizing Provider  acetaminophen (TYLENOL) 325 MG tablet Take 2 tablets (650 mg total) by mouth every 4 (four) hours as needed for mild pain. 10/25/14  Yes Ivan Anchors Love, PA-C  ALPRAZolam (XANAX) 0.5 MG tablet Take 1 tablet (0.5 mg total) by mouth 2 (two) times daily as needed for anxiety. 10/25/14  Yes Ivan Anchors Love, PA-C  amLODipine (NORVASC) 10 MG tablet Take 1 tablet (10 mg total) by mouth daily. 10/25/14  Yes Ivan Anchors Love, PA-C  atorvastatin (LIPITOR) 20 MG tablet Take 1 tablet (  20 mg total) by mouth daily at 6 PM. 10/25/14  Yes Ivan Anchors Love, PA-C  cloNIDine (CATAPRES) 0.3 MG tablet Take 1 tablet (0.3 mg total) by mouth 3 (three) times daily. 10/25/14  Yes Ivan Anchors Love, PA-C  diclofenac sodium (VOLTAREN) 1 % GEL Apply 2 g topically 4 (four) times daily. To forefoot 10/25/14  Yes Bary Leriche, PA-C  ferrous sulfate 325 (65 FE) MG tablet Take 1 tablet (325 mg total) by mouth daily with breakfast. 10/25/14  Yes Ivan Anchors Love, PA-C  furosemide (LASIX) 80 MG tablet Take 1 tablet (80 mg total) by mouth 2 (two) times daily. 10/25/14  Yes Ivan Anchors Love, PA-C  gabapentin (NEURONTIN) 100 MG capsule Take 1  capsule (100 mg total) by mouth 3 (three) times daily. Start with 1 daily and taper up to three times daily 11/13/14  Yes Gay Filler Copland, MD  hydrALAZINE (APRESOLINE) 25 MG tablet Take 1 tablet (25 mg total) by mouth every 8 (eight) hours. 10/25/14  Yes Ivan Anchors Love, PA-C  hydroxychloroquine (PLAQUENIL) 200 MG tablet Take 2 tablets (400 mg total) by mouth daily. 10/25/14  Yes Ivan Anchors Love, PA-C  isosorbide mononitrate (IMDUR) 60 MG 24 hr tablet Take 1 tablet (60 mg total) by mouth daily. 10/25/14  Yes Ivan Anchors Love, PA-C  labetalol (NORMODYNE) 200 MG tablet Take 2 tablets (400 mg total) by mouth 2 (two) times daily. 10/25/14  Yes Ivan Anchors Love, PA-C  Menthol-Methyl Salicylate (MUSCLE RUB) 10-15 % CREA Apply 1 application topically 3 (three) times daily before meals. 10/25/14  Yes Ivan Anchors Love, PA-C  multivitamin (RENA-VIT) TABS tablet Take 1 tablet by mouth daily.   Yes Historical Provider, MD  mycophenolate (CELLCEPT) 250 MG capsule Take 4 capsules (1,000 mg total) by mouth 2 (two) times daily. 10/25/14  Yes Ivan Anchors Love, PA-C  nicotine (NICODERM CQ - DOSED IN MG/24 HOURS) 14 mg/24hr patch Place 1 patch (14 mg total) onto the skin daily. 10/25/14  Yes Ivan Anchors Love, PA-C  nystatin-triamcinolone ointment (MYCOLOG) Apply 1 application topically 2 (two) times daily. 11/06/14  Yes Mercedes Strupp Camprubi-Soms, PA-C  omeprazole (PRILOSEC) 20 MG capsule Take 1 capsule (20 mg total) by mouth daily. 11/06/14  Yes Mercedes Strupp Camprubi-Soms, PA-C  pantoprazole (PROTONIX) 40 MG tablet Take 1 tablet (40 mg total) by mouth 2 (two) times daily. 10/25/14  Yes Ivan Anchors Love, PA-C  senna-docusate (SENOKOT-S) 8.6-50 MG per tablet Take 1 tablet by mouth 2 (two) times daily. 10/25/14  Yes Ivan Anchors Love, PA-C  sodium bicarbonate 650 MG tablet Take 2 tablets (1,300 mg total) by mouth 2 (two) times daily. 10/25/14  Yes Ivan Anchors Love, PA-C  Alum & Mag Hydroxide-Simeth (MAGIC MOUTHWASH W/LIDOCAINE) SOLN Take 5 mLs by  mouth 3 (three) times daily as needed for mouth pain. Patient not taking: Reported on 11/18/2014 11/06/14   Patty Sermons Camprubi-Soms, PA-C  predniSONE (DELTASONE) 10 MG tablet Take two pills daily for three day. Then decrease to one pill X 3 days. Then decrease to 1/2 pill daily till gone. Patient not taking: Reported on 11/18/2014 10/25/14   Ivan Anchors Love, PA-C  Vitamin D, Ergocalciferol, (DRISDOL) 50000 UNITS CAPS capsule Take 1 capsule (50,000 Units total) by mouth every Wednesday. 10/25/14   Ivan Anchors Love, PA-C   BP 156/76 mmHg  Pulse 128  Temp(Src) 102.5 F (39.2 C) (Oral)  Resp 35  SpO2 97%  LMP 07/18/2012   Filed Vitals:   11/19/14 0200 11/19/14 0315 11/19/14  0327 11/19/14 0330  BP: 132/65 140/77  156/76  Pulse: 117 123  128  Temp:   102.5 F (39.2 C)   TempSrc:   Oral   Resp: 22 26  35  SpO2: 99% 100%  97%    Physical Exam  0010: Physical examination:  Nursing notes reviewed; Vital signs and O2 SAT reviewed;  Constitutional: Well developed, Well nourished, In no acute distress; Head:  Normocephalic, atraumatic; Eyes: EOMI, PERRL, No scleral icterus. Conjunctiva pale.; ENMT: Mouth and pharynx normal, Mucous membranes dry; Neck: Supple, Full range of motion, No lymphadenopathy; Cardiovascular: Tachycardic rate and rhythm, No gallop; Respiratory: Breath sounds clear & equal bilaterally, No rales, rhonchi, wheezes.  Speaking full sentences with ease, Normal respiratory effort/excursion; Chest: Nontender, Movement normal; Abdomen: Soft, Nontender, Nondistended, Normal bowel sounds. Rectal exam performed w/permission of pt and ED RN chaperone present.  Anal tone normal.  Non-tender, soft black stool in rectal vault, heme positive.  No fissures, +external hemorrhoids without thrombosis or bleeding. No palp masses.; Genitourinary: No CVA tenderness; Extremities: Pulses normal, No tenderness, No edema, No calf edema or asymmetry.; Neuro: AA&Ox3, Major CN grossly intact.  Speech clear.  +mild left sided weakness (hx CVA), otherwise no new gross focal motor deficits in extremities.; Skin: Color pale, Warm, Dry.   ED Course  Procedures     EKG Interpretation   Date/Time:  Sunday November 19 2014 00:49:34 EST Ventricular Rate:  118 PR Interval:  161 QRS Duration: 85 QT Interval:  347 QTC Calculation: 486 R Axis:   -19 Text Interpretation:  Sinus tachycardia Probable left atrial enlargement  Left axis deviation Probable anteroseptal infarct, old Nonspecific T wave  abnormality Lateral leads Baseline wander When compared with ECG of  11/06/2014 No significant change was found Confirmed by [facility]  MD,  Nunzio Cory [phone]) on 11/19/2014 1:03:15 AM      MDM  MDM Reviewed: previous chart, nursing note and vitals Reviewed previous: labs and ECG Interpretation: labs, ECG and x-ray Total time providing critical care: 30-74 minutes. This excludes time spent performing separately reportable procedures and services. Consults: admitting MD   CRITICAL CARE Performed by: Alfonzo Feller Total critical care time: 35 Critical care time was exclusive of separately billable procedures and treating other patients. Critical care was necessary to treat or prevent imminent or life-threatening deterioration. Critical care was time spent personally by me on the following activities: development of treatment plan with patient and/or surrogate as well as nursing, discussions with consultants, evaluation of patient's response to treatment, examination of patient, obtaining history from patient or surrogate, ordering and performing treatments and interventions, ordering and review of laboratory studies, ordering and review of radiographic studies, pulse oximetry and re-evaluation of patient's condition.    Results for orders placed or performed during the hospital encounter of 11/18/14  CBC with Differential  Result Value Ref Range   WBC 5.6 4.0 - 10.5 K/uL   RBC 2.03 (L) 3.87 - 5.11  MIL/uL   Hemoglobin 5.4 (LL) 12.0 - 15.0 g/dL   HCT 16.9 (L) 36.0 - 46.0 %   MCV 83.3 78.0 - 100.0 fL   MCH 26.6 26.0 - 34.0 pg   MCHC 32.0 30.0 - 36.0 g/dL   RDW 14.1 11.5 - 15.5 %   Platelets 245 150 - 400 K/uL   Neutrophils Relative % 81 (H) 43 - 77 %   Neutro Abs 4.5 1.7 - 7.7 K/uL   Lymphocytes Relative 11 (L) 12 - 46 %   Lymphs Abs 0.6 (  L) 0.7 - 4.0 K/uL   Monocytes Relative 8 3 - 12 %   Monocytes Absolute 0.4 0.1 - 1.0 K/uL   Eosinophils Relative 0 0 - 5 %   Eosinophils Absolute 0.0 0.0 - 0.7 K/uL   Basophils Relative 0 0 - 1 %   Basophils Absolute 0.0 0.0 - 0.1 K/uL  Comprehensive metabolic panel  Result Value Ref Range   Sodium 129 (L) 135 - 145 mmol/L   Potassium 4.5 3.5 - 5.1 mmol/L   Chloride 97 96 - 112 mEq/L   CO2 18 (L) 19 - 32 mmol/L   Glucose, Bld 128 (H) 70 - 99 mg/dL   BUN 59 (H) 6 - 23 mg/dL   Creatinine, Ser 3.57 (H) 0.50 - 1.10 mg/dL   Calcium 7.7 (L) 8.4 - 10.5 mg/dL   Total Protein 6.2 6.0 - 8.3 g/dL   Albumin 2.1 (L) 3.5 - 5.2 g/dL   AST 18 0 - 37 U/L   ALT 8 0 - 35 U/L   Alkaline Phosphatase 77 39 - 117 U/L   Total Bilirubin 0.3 0.3 - 1.2 mg/dL   GFR calc non Af Amer 14 (L) >90 mL/min   GFR calc Af Amer 16 (L) >90 mL/min   Anion gap 14 5 - 15  Urinalysis, Routine w reflex microscopic  Result Value Ref Range   Color, Urine YELLOW YELLOW   APPearance CLEAR CLEAR   Specific Gravity, Urine 1.015 1.005 - 1.030   pH 5.5 5.0 - 8.0   Glucose, UA NEGATIVE NEGATIVE mg/dL   Hgb urine dipstick NEGATIVE NEGATIVE   Bilirubin Urine NEGATIVE NEGATIVE   Ketones, ur NEGATIVE NEGATIVE mg/dL   Protein, ur 100 (A) NEGATIVE mg/dL   Urobilinogen, UA 0.2 0.0 - 1.0 mg/dL   Nitrite NEGATIVE NEGATIVE   Leukocytes, UA NEGATIVE NEGATIVE  Lipase, blood  Result Value Ref Range   Lipase 28 11 - 59 U/L  Lactic acid, plasma  Result Value Ref Range   Lactic Acid, Venous 1.0 0.5 - 2.2 mmol/L  Troponin I  Result Value Ref Range   Troponin I <0.03 <0.031 ng/mL  CK   Result Value Ref Range   Total CK 36 7 - 177 U/L  Urine microscopic-add on  Result Value Ref Range   WBC, UA 0-2 <3 WBC/hpf   RBC / HPF 0-2 <3 RBC/hpf   Bacteria, UA RARE RARE  POC occult blood, ED  Result Value Ref Range   Fecal Occult Bld POSITIVE (A) NEGATIVE  Type and screen  Result Value Ref Range   ABO/RH(D) O POS    Antibody Screen NEG    Sample Expiration 11/21/2014    Unit Number [id number]    Blood Component Type RED CELLS,LR    Unit division 00    Status of Unit ISSUED    Transfusion Status OK TO TRANSFUSE    Crossmatch Result Compatible    Unit Number [id number]    Blood Component Type RED CELLS,LR    Unit division 00    Status of Unit ALLOCATED    Transfusion Status OK TO TRANSFUSE    Crossmatch Result Compatible   Prepare RBC  Result Value Ref Range   Order Confirmation ORDER PROCESSED BY BLOOD BANK    Dg Abd Acute W/chest 11/19/2014   CLINICAL DATA:  Diarrhea for 2 weeks.  EXAM: ACUTE ABDOMEN SERIES (ABDOMEN 2 VIEW & CHEST 1 VIEW)  COMPARISON:  Chest radiograph 11/06/2014  FINDINGS: Mild cardiomegaly is stable. The lungs are clear.  There is no free intra-abdominal air. No dilated bowel loops to suggest obstruction. There is questionable colonic wall thickening involving the rectosigmoid colon. No radiopaque calculi. No acute osseous abnormalities are seen.  IMPRESSION: 1. Nonobstructive bowel gas pattern. No free air. There is questionable wall thickening of the sigmoid colon, may reflect enteritis/colitis. 2. Stable mild cardiomegaly.   Electronically Signed   By: Jeb Levering M.D.   On: 11/19/2014 02:16    Results for TYESHIA, CORNFORTH (MRN [id number]) as of 11/19/2014 02:21  Ref. Range 10/14/2014 04:50 10/18/2014 04:32 10/20/2014 05:44 11/06/2014 13:00 11/18/2014 23:24  Hemoglobin Latest Range: 12.0-15.0 g/dL 8.8 (L) 8.4 (L) 8.6 (L) 8.6 (L) 5.4 (LL)  HCT Latest Range: 36.0-46.0 % 25.8 (L) 26.2 (L) 25.7 (L) 26.5 (L) 16.9 (L)   Results for [facility]  (MRN [id number]) as of 11/19/2014 02:21  Ref. Range 11/06/2014 13:00 11/10/2014 18:22 11/18/2014 23:24  BUN Latest Range: 6-23 mg/dL 86 (H) 63 (H) 59 (H)  Creatinine Latest Range: 0.50-1.10 mg/dL 4.50 (H) 3.49 (H) 3.57 (H)    0245:  BUN/Cr elevated per baseline. New anemia with heme positive stool; will transfuse PRBC's. Tachycardia improving with IVF; will continue same. Abd remains benign on exam. No N/V or stooling while in the ED; cdiff PCR and GI pathogen panel ordered. Dx and testing d/w pt and family.  Questions answered.  Verb understanding, agreeable to admit. [place] Residents are capped per Unit Secretary. T/C to [facility]. Blaine Hamper, case discussed, including:  HPI, pertinent PM/SHx, VS/PE, dx testing, ED course and treatment:  Agreeable to admit, requests to write temporary orders and call GI MD to consult, obtain tele bed to team MCAdmits.  0400:  No c/b from GI MD.    Francine Graven, DO 11/20/14 [phone]

## 2014-11-19 NOTE — ED Notes (Signed)
The patient is unable to give an urine specimen at this time. The tech has reported to the RN in charge. 

## 2014-11-19 NOTE — Progress Notes (Addendum)
PT Cancellation Note  Patient Details Name: [name] [name] MRN: [id number] DOB: 1961-08-13   Cancelled Treatment:    Reason Eval/Treat Not Completed: Other (comment) (pt on bedrest, MD paged.)  Pt's Hgb is also 5 and she is getting blood.  Pt would not be appropriate for PT today anyway.  Please update activity orders when appropriate for pt and PT will complete assessment.   Thanks,    Barbarann Ehlers. [place], [place], DPT [phone]   11/19/2014, 9:22 AM

## 2014-11-19 NOTE — Progress Notes (Signed)
PATIENT DETAILS Name: [name] [name] Age: 54 y.o. Sex: female Date of Birth: 21-Jun-1961 Admit Date: 11/18/2014 Admitting Physician Ivor Costa, MD PCP:No primary care provider on file.  Subjective: No new complaints this am  Assessment/Plan: Principal Problem:   Lower GIB (gastrointestinal bleeding):suspect secondary to Colitis. GI consulted, plans are for a CT Abd.Follow CBC.  Active Problems:   Suspected Colitis:black watery stools for 1 week,on immunosuppressives. Will continue Empiric Cipro/Flagyl. Await stool studies and CT Abd.Clear liquids started.    Acute Blood Loss Anemia:has baseline chronic anemia secondary to CKD, acute anemia is secondary to blood loss from colitis. Transfused 2 units of PRBC, follow post transfusion CBC.    Hyponatremia:better. Suspect this was secondary to dehydration.    Metabolic Acidosis:secondary to CKD, continue Bicarbonate    CKD stage 4:creatinine close to usual baseline. 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 Continue Cellcept.    Hx of SLE/RA:continue Cellcept and Plaquenil.    JHE:RDEYCXKG control, continue Amlodipine,Clonidine,Hydralazine,Imdur, Labetalol. Will follow for another 24 hours before adjusting. Lasix remains on hold-resume in next few days depending on clinical course.    Recent hx of YJE:HUDJ residual left sided deficits.  Disposition: Remain inpatient  Antibiotics:  See below   Anti-infectives    Start     Dose/Rate Route Frequency Ordered Stop   11/19/14 1000  hydroxychloroquine (PLAQUENIL) tablet 400 mg     400 mg Oral Daily 11/19/14 0530     11/19/14 0600  metroNIDAZOLE (FLAGYL) IVPB 500 mg     500 mg100 mL/hr over 60 Minutes Intravenous Every 8 hours 11/19/14 0530     11/19/14 0600  ciprofloxacin (CIPRO) IVPB 400 mg     400 mg200 mL/hr over 60 Minutes Intravenous Every 24 hours 11/19/14 0539        DVT Prophylaxis: SCD's  Code Status: Full code  Family Communication None  at bedside  Procedures:  None  CONSULTS:  GI  Time spent 40 minutes-which includes 50% of the time with face-to-face with patient/ family and coordinating care related to the above assessment and plan.  MEDICATIONS: Scheduled Meds: . amLODipine  10 mg Oral Daily  . atorvastatin  20 mg Oral q1800  . ciprofloxacin  400 mg Intravenous Q24H  . cloNIDine  0.3 mg Oral TID  . diclofenac sodium  2 g Topical QID  . ferrous sulfate  325 mg Oral Q breakfast  . gabapentin  100 mg Oral TID  . hydrALAZINE  25 mg Oral 3 times per day  . hydroxychloroquine  400 mg Oral Daily  . isosorbide mononitrate  60 mg Oral Daily  . labetalol  400 mg Oral BID  . metronidazole  500 mg Intravenous Q8H  . multivitamin  1 tablet Oral Daily  . MUSCLE RUB  1 application Topical TID AC  . mycophenolate  1,000 mg Oral BID  . nystatin-triamcinolone ointment  1 application Topical BID  . pantoprazole (PROTONIX) IV  40 mg Intravenous Q12H  . sodium bicarbonate  1,300 mg Oral BID  . sodium chloride  3 mL Intravenous Q12H   Continuous Infusions: . sodium chloride 100 mL/hr at 11/19/14 0800   PRN Meds:.acetaminophen, ALPRAZolam, morphine injection, ondansetron **OR** ondansetron (ZOFRAN) IV    PHYSICAL EXAM: Vital signs in last 24 hours: Filed Vitals:   11/19/14 1015 11/19/14 1100 11/19/14 1200 11/19/14 1224  BP:  154/57 175/71 175/71  Pulse: 101 102 104 106  Temp:    99.3 F (37.4 C)  TempSrc:    Oral  Resp: 25 16 30  34  Height:      Weight:      SpO2: 100% 100% 100% 99%    Weight change:  Filed Weights   11/19/14 0700  Weight: 58.06 kg (128 lb)   Body mass index is 21.3 kg/(m^2).   Gen Exam: Awake and alert.Sleepy when I walked in. Neck: Supple, No JVD.   Chest: B/L Clear.   CVS: S1 S2 Regular, no murmurs.  Abdomen: soft, BS +, tender in left abd, non distended. Extremities: no edema, lower extremities warm to touch. Neurologic: Left sided residual weakness Skin: No Rash.   Wounds:  N/A.    Intake/Output from previous day:  Intake/Output Summary (Last 24 hours) at 11/19/14 1241 Last data filed at 11/19/14 1200  Gross per 24 hour  Intake   2115 ml  Output    700 ml  Net   1415 ml     LAB RESULTS: CBC  Recent Labs Lab 11/18/14 2324 11/19/14 0603 11/19/14 1133  WBC 5.6 5.3 4.4  HGB 5.4* 5.0* 7.7*  HCT 16.9* 15.4* 23.2*  PLT 245 206 211  MCV 83.3 85.1 83.8  MCH 26.6 27.6 27.8  MCHC 32.0 32.5 33.2  RDW 14.1 14.2 13.8  LYMPHSABS 0.6*  --   --   MONOABS 0.4  --   --   EOSABS 0.0  --   --   BASOSABS 0.0  --   --     Chemistries   Recent Labs Lab 11/18/14 2324 11/19/14 0603  NA 129* 132*  K 4.5 4.0  CL 97 103  CO2 18* 16*  GLUCOSE 128* 97  BUN 59* 55*  CREATININE 3.57* 3.24*  CALCIUM 7.7* 7.3*    CBG:  Recent Labs Lab 11/19/14 0821  GLUCAP 84    GFR Estimated Creatinine Clearance: 18.3 mL/min (by C-G formula based on Cr of 3.24).  Coagulation profile  Recent Labs Lab 11/19/14 0603  INR 1.18    Cardiac Enzymes  Recent Labs Lab 11/19/14 0302  TROPONINI <0.03    Invalid input(s): POCBNP No results for input(s): DDIMER in the last 72 hours. No results for input(s): HGBA1C in the last 72 hours. No results for input(s): CHOL, HDL, LDLCALC, TRIG, CHOLHDL, LDLDIRECT in the last 72 hours. No results for input(s): TSH, T4TOTAL, T3FREE, THYROIDAB in the last 72 hours.  Invalid input(s): FREET3 No results for input(s): VITAMINB12, FOLATE, FERRITIN, TIBC, IRON, RETICCTPCT in the last 72 hours.  Recent Labs  11/19/14 0302  LIPASE 28    Urine Studies No results for input(s): UHGB, CRYS in the last 72 hours.  Invalid input(s): UACOL, UAPR, USPG, UPH, UTP, UGL, UKET, UBIL, UNIT, UROB, ULEU, UEPI, UWBC, URBC, UBAC, CAST, UCOM, BILUA  MICROBIOLOGY: Recent Results (from the past 240 hour(s))  MRSA PCR Screening     Status: None   Collection Time: 11/19/14  5:30 AM  Result Value Ref Range Status   MRSA by PCR NEGATIVE  NEGATIVE Final    Comment:        The GeneXpert MRSA Assay (FDA approved for NASAL specimens only), is one component of a comprehensive MRSA colonization surveillance program. It is not intended to diagnose MRSA infection nor to guide or monitor treatment for MRSA infections.     RADIOLOGY STUDIES/RESULTS: Dg Chest 2 View  11/06/2014   CLINICAL DATA:  Initial evaluation for dysphagia and chest pain, shortness of breath, personal history of lupus and stroke, patient smokes  EXAM: CHEST  2 VIEW  COMPARISON:  10/10/2014  FINDINGS: Mild cardiac enlargement. Vascular pattern is normal. No consolidation or effusion. No change from prior study.  IMPRESSION: Stable cardiac enlargement with no acute findings   Electronically Signed   By: Skipper Cliche M.D.   On: 11/06/2014 14:29   Dg Abd Acute W/chest  11/19/2014   CLINICAL DATA:  Diarrhea for 2 weeks.  EXAM: ACUTE ABDOMEN SERIES (ABDOMEN 2 VIEW & CHEST 1 VIEW)  COMPARISON:  Chest radiograph 11/06/2014  FINDINGS: Mild cardiomegaly is stable. The lungs are clear. There is no free intra-abdominal air. No dilated bowel loops to suggest obstruction. There is questionable colonic wall thickening involving the rectosigmoid colon. No radiopaque calculi. No acute osseous abnormalities are seen.  IMPRESSION: 1. Nonobstructive bowel gas pattern. No free air. There is questionable wall thickening of the sigmoid colon, may reflect enteritis/colitis. 2. Stable mild cardiomegaly.   Electronically Signed   By: Jeb Levering M.D.   On: 11/19/2014 02:16    Oren Binet, MD  [facility] Pager:[phone]  If 7PM-7AM, please contact night-coverage [email or web address] Password [email or web address] 11/19/2014, 12:41 PM   LOS: 1 day

## 2014-11-19 NOTE — H&P (Addendum)
[facility] History and Physical  DESHONDA CRYDERMAN RWE:[id number] DOB: 07/04/1961 DOA: 11/18/2014  Referring physician: ED physician PCP: No primary care provider on file.  Specialists:   Chief Complaint: abdominal pain, diarrhea and black stool.  HPI: [name] [name] is a 54 y.o. female with past medical history of hypertension, lupus (on plaquenil and CellCept), chronic kidney disease-IV, recent history of intracranial hemorrhage, memberanous glomerulonephritis, GERD, history of adrenal insufficiency, who presents with abdominal pain, diarrhea and black stool.  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 She went home, and currently is doing physical therapy at home. She has been doing fine until 4-5 days ago when she started having abdominal pain and diarrhea with black and watery stools. She does not have nausea, vomiting, no subjective fever or chills. Patient does not have chest pain or shortness of breath. Denies recent antibiotic use. She has right side of the weakness from previous stroke which has not worsened. She also has severe generalized weakness. Patient denies cough, chest pain, SOB, dysuria, urgency, frequency, hematuria, skin rashes or leg swelling.   Work up in the ED demonstrates a decreased hemoglobin from 8.6 on 11/06/14 to 5.4 on this admission. FOBT is positive. X-ray of abdomen showed possible enteritis or colitis. Patient has mild fever with temperature 99.9. She has tachycardia with heart rate at about 120/minute. Patient is admitted to inpatient for further evaluation and treatment. GI was consulted by ED.  * patient belongs to [place], but family medicine is capped. We were asked to admit patient to ED.  Review of Systems: As presented in the history of presenting illness, rest negative.  Where does patient live? At home  Can patient participate in ADLs? none  Allergy:  Allergies  Allergen Reactions  . Sulfa Antibiotics  Itching    Past Medical History  Diagnosis Date  . Hypertension   . Lupus   . CKD (chronic kidney disease)     due to lupus/Dr. [name]  . Diabetes mellitus   . Stenosis of cervical spine region     with HNP at C5/6, C6/7  . Metatarsal bone fracture right    4th  . 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 Arthritis     RA  . Anemia   . Stroke 11/2014    left sided weakness, dysphagia  . Pain in joints   . Paresthesias     Past Surgical History  Procedure Laterality Date  . Breast biopsy      Social History:  reports that she has quit smoking. Her smoking use included Cigarettes. She has a 15 pack-year smoking history. She has never used smokeless tobacco. She reports that she does not drink alcohol or use illicit drugs.  Family History:  Family History  Problem Relation Age of Onset  . Hypertension    . Lupus    . Rheum arthritis    . Hypertension Mother   . Diabetes Mother   . Hypertension Sister   . Diabetes Father   . Hypertension Maternal Grandmother      Prior to Admission medications   Medication Sig Start Date End Date Taking? Authorizing Provider  acetaminophen (TYLENOL) 325 MG tablet Take 2 tablets (650 mg total) by mouth every 4 (four) hours as needed for mild pain. 10/25/14  Yes Ivan Anchors Love, PA-C  ALPRAZolam Duanne Moron) 0.5 MG tablet Take 1 tablet (0.5 mg total)  by mouth 2 (two) times daily as needed for anxiety. 10/25/14  Yes Ivan Anchors Love, PA-C  amLODipine (NORVASC) 10 MG tablet Take 1 tablet (10 mg total) by mouth daily. 10/25/14  Yes Ivan Anchors Love, PA-C  atorvastatin (LIPITOR) 20 MG tablet Take 1 tablet (20 mg total) by mouth daily at 6 PM. 10/25/14  Yes Ivan Anchors Love, PA-C  cloNIDine (CATAPRES) 0.3 MG tablet Take 1 tablet (0.3 mg total) by mouth 3 (three) times daily. 10/25/14  Yes Ivan Anchors Love, PA-C  diclofenac sodium (VOLTAREN) 1 % GEL Apply 2 g topically 4 (four) times daily. To forefoot 10/25/14  Yes  Bary Leriche, PA-C  ferrous sulfate 325 (65 FE) MG tablet Take 1 tablet (325 mg total) by mouth daily with breakfast. 10/25/14  Yes Ivan Anchors Love, PA-C  furosemide (LASIX) 80 MG tablet Take 1 tablet (80 mg total) by mouth 2 (two) times daily. 10/25/14  Yes Ivan Anchors Love, PA-C  gabapentin (NEURONTIN) 100 MG capsule Take 1 capsule (100 mg total) by mouth 3 (three) times daily. Start with 1 daily and taper up to three times daily 11/13/14  Yes Gay Filler Copland, MD  hydrALAZINE (APRESOLINE) 25 MG tablet Take 1 tablet (25 mg total) by mouth every 8 (eight) hours. 10/25/14  Yes Ivan Anchors Love, PA-C  hydroxychloroquine (PLAQUENIL) 200 MG tablet Take 2 tablets (400 mg total) by mouth daily. 10/25/14  Yes Ivan Anchors Love, PA-C  isosorbide mononitrate (IMDUR) 60 MG 24 hr tablet Take 1 tablet (60 mg total) by mouth daily. 10/25/14  Yes Ivan Anchors Love, PA-C  labetalol (NORMODYNE) 200 MG tablet Take 2 tablets (400 mg total) by mouth 2 (two) times daily. 10/25/14  Yes Ivan Anchors Love, PA-C  Menthol-Methyl Salicylate (MUSCLE RUB) 10-15 % CREA Apply 1 application topically 3 (three) times daily before meals. 10/25/14  Yes Ivan Anchors Love, PA-C  multivitamin (RENA-VIT) TABS tablet Take 1 tablet by mouth daily.   Yes Historical Provider, MD  mycophenolate (CELLCEPT) 250 MG capsule Take 4 capsules (1,000 mg total) by mouth 2 (two) times daily. 10/25/14  Yes Ivan Anchors Love, PA-C  nicotine (NICODERM CQ - DOSED IN MG/24 HOURS) 14 mg/24hr patch Place 1 patch (14 mg total) onto the skin daily. 10/25/14  Yes Ivan Anchors Love, PA-C  nystatin-triamcinolone ointment (MYCOLOG) Apply 1 application topically 2 (two) times daily. 11/06/14  Yes Mercedes Strupp Camprubi-Soms, PA-C  omeprazole (PRILOSEC) 20 MG capsule Take 1 capsule (20 mg total) by mouth daily. 11/06/14  Yes Mercedes Strupp Camprubi-Soms, PA-C  pantoprazole (PROTONIX) 40 MG tablet Take 1 tablet (40 mg total) by mouth 2 (two) times daily. 10/25/14  Yes Ivan Anchors Love, PA-C   senna-docusate (SENOKOT-S) 8.6-50 MG per tablet Take 1 tablet by mouth 2 (two) times daily. 10/25/14  Yes Ivan Anchors Love, PA-C  sodium bicarbonate 650 MG tablet Take 2 tablets (1,300 mg total) by mouth 2 (two) times daily. 10/25/14  Yes Ivan Anchors Love, PA-C  Alum & Mag Hydroxide-Simeth (MAGIC MOUTHWASH W/LIDOCAINE) SOLN Take 5 mLs by mouth 3 (three) times daily as needed for mouth pain. Patient not taking: Reported on 11/18/2014 11/06/14   Patty Sermons Camprubi-Soms, PA-C  predniSONE (DELTASONE) 10 MG tablet Take two pills daily for three day. Then decrease to one pill X 3 days. Then decrease to 1/2 pill daily till gone. Patient not taking: Reported on 11/18/2014 10/25/14   Ivan Anchors Love, PA-C  Vitamin D, Ergocalciferol, (DRISDOL) 50000 UNITS CAPS capsule Take 1 capsule (50,000  Units total) by mouth every Wednesday. 10/25/14   Bary Leriche, PA-C    Physical Exam: Filed Vitals:   11/19/14 0100 11/19/14 0119 11/19/14 0149 11/19/14 0200  BP: 148/64 148/100 150/69 132/65  Pulse: 119  121 117  Temp:      TempSrc:      Resp: 21  20 22   SpO2: 100%  100% 99%   General: Not in acute distress HEENT:       Eyes: PERRL, EOMI, no scleral icterus       ENT: No discharge from the ears and nose, no pharynx injection, no tonsillar enlargement.        Neck: No JVD, no bruit, no mass felt. Cardiac: S1/S2, RRR, No murmurs, No gallops or rubs Pulm: Good air movement bilaterally. Clear to auscultation bilaterally. No rales, wheezing, rhonchi or rubs. Abd: Soft, nondistended, diffused tenderness, no rebound pain, no organomegaly, BS present Ext: No edema bilaterally. 2+DP/PT pulse bilaterally Musculoskeletal: No joint deformities, erythema, or stiffness, ROM full Skin: No rashes.  Neuro: Alert and oriented X3, cranial nerves II-XII grossly intact, muscle strength 2/5 in in left arm and leg.  Brachial reflex 1+ bilaterally. Knee reflex 1+ bilaterally. Negative Babinski's sign. Psych: Patient is not psychotic,  no suicidal or hemocidal ideation.  Labs on Admission:  Basic Metabolic Panel:  Recent Labs Lab 11/18/14 2324  NA 129*  K 4.5  CL 97  CO2 18*  GLUCOSE 128*  BUN 59*  CREATININE 3.57*  CALCIUM 7.7*   Liver Function Tests:  Recent Labs Lab 11/18/14 2324  AST 18  ALT 8  ALKPHOS 77  BILITOT 0.3  PROT 6.2  ALBUMIN 2.1*   No results for input(s): LIPASE, AMYLASE in the last 168 hours. No results for input(s): AMMONIA in the last 168 hours. CBC:  Recent Labs Lab 11/18/14 2324  WBC 5.6  NEUTROABS 4.5  HGB 5.4*  HCT 16.9*  MCV 83.3  PLT 245   Cardiac Enzymes:  Recent Labs Lab 11/19/14 0018  CKTOTAL 36    BNP (last 3 results)  Recent Labs  10/05/14 0521  PROBNP 20673.0*   CBG: No results for input(s): GLUCAP in the last 168 hours.  Radiological Exams on Admission: Dg Abd Acute W/chest  11/19/2014   CLINICAL DATA:  Diarrhea for 2 weeks.  EXAM: ACUTE ABDOMEN SERIES (ABDOMEN 2 VIEW & CHEST 1 VIEW)  COMPARISON:  Chest radiograph 11/06/2014  FINDINGS: Mild cardiomegaly is stable. The lungs are clear. There is no free intra-abdominal air. No dilated bowel loops to suggest obstruction. There is questionable colonic wall thickening involving the rectosigmoid colon. No radiopaque calculi. No acute osseous abnormalities are seen.  IMPRESSION: 1. Nonobstructive bowel gas pattern. No free air. There is questionable wall thickening of the sigmoid colon, may reflect enteritis/colitis. 2. Stable mild cardiomegaly.   Electronically Signed   By: Jeb Levering M.D.   On: 11/19/2014 02:16    EKG: Independently reviewed.   Assessment/Plan Principal Problem:   GIB (gastrointestinal bleeding) Active Problems:   Iron deficiency anemia   Systemic lupus erythematosus   Rheumatoid arthritis   History of adrenal insufficiency   History of stroke   ICH (intracerebral hemorrhage)   Essential hypertension   Membranous glomerulonephritis   Hyponatremia   Tobacco abuse    CKD (chronic kidney disease) stage 4, GFR 15-29 ml/min  Abdominal pain and GIB: It is most likely caused by enteritis or colitis. Patient's diarrhea, abdominal pain, fever and x-ray findings are all consistent with this  diagnosis. Since patient is immunosuppressed, she is at high risk for opportunistic infection. Currently patient is hemodynamically stable.  - will admit to SDU - GI consulted by Ed, will follow up recommendations - NPO - transfuse 2 units of blood - NS at 100 mL/hr - Start IV pantoprazole 40 mg bib - Zofran IV for nausea and morphine for pain - Avoid NSAIDs and SQ heparin - Monitor closely and follow q6h cbc, transfuse as necessary. - LaB: INR, PTT, Lactate, lipase, cortisol level - start IV Flagyl and Cipro - blood culture x 2, stool culture, ova/parasite, GI pathogen panel  HTN: -hold Lasix -continue amlodipine, clonidine, hydralazine, labetalol, -will d/c all bp meds if her bp drops which will be due to sepsis  SLE and RA: -continue home Plaquenil and cellcept  Hx of ICH: with sequela of left side weakness   -continue lipitor -bp control  CKD-IV: Baseline creatinine is 3.2-3.49. Her creatinine is 3.57 on admission, which is close to baseline. Slight elevation is most likely due to prerenal secondary to severe anemia. -will hold lasix in the setting of GIB -continue other home meds, including bicarbonate, rena-vit -IVF and blood transfusion as above   DVT ppx: SCD Code Status: Full code Family Communication:    Yes, patient's  sister      at bed side Disposition Plan: Admit to inpatient   Date of Service 11/19/2014    Ivor Costa [facility] Pager [phone]  If 7PM-7AM, please contact night-coverage [email or web address] Password [email or web address] 11/19/2014, 2:55 AM

## 2014-11-19 NOTE — Consult Note (Signed)
Consultation  Referring Provider: [facility] Primary Care Physician:  No primary care provider on file. Primary Gastroenterologist:  none  Reason for Consultation:  Melena, anemia, diarrhea  HPI: [name] [name] is a 54 y.o. female with multiple medical problems who was admitted last night with complaints of 1 week history of diarrhea abdominal discomfort and black stools. She has history of lupus and is maintained on Plaquenil and CellCept. 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 She has adult onset diabetes mellitus, hyperlipidemia, peripheral neuropathy, rheumatoid arthritis, adrenal insufficiency, and had a recent intracranial hemorrhage in December 2015. Patient had seen Dr. Ardis Hughs remotely in 2008 she believes for colonoscopy. Those records are not available  In EP[facility]. Patient says she had been sick at home for about a week having diarrhea with everything that she ate. She was generally having 5-6 watery dark to black bowel movements per day. No bright red blood. No documented fever or chills. No nausea or vomiting. She has had some generalized lower abdominal discomfort. She also developed progressive weakness prior to admission. She denies any regular aspirin or NSAID use. She does have history of chronic anemia and hemoglobin was 8.6 on 11/06/2014 down to 5.4 on admission. No recent antibiotics. She has had low-grade temps since admission. Stool cultures and stool for C. difficile are pending. Plain abdominal films on admission showed questionable  thickening of the sigmoid colon. Today she says she has no appetite has left greater than right lower abdominal discomfort and has not had any diarrhea.   Past Medical History  Diagnosis Date  . Hypertension   . Lupus   . CKD (chronic kidney disease)     due to lupus/Dr. [name]  . Diabetes mellitus   . Stenosis of cervical spine region     with HNP at C5/6, C6/7  . Metatarsal bone fracture right      4th  . 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 Arthritis     RA  . Anemia   . Stroke 11/2014    left sided weakness, dysphagia  . Pain in joints   . Paresthesias     Past Surgical History  Procedure Laterality Date  . Breast biopsy      Prior to Admission medications   Medication Sig Start Date End Date Taking? Authorizing Provider  acetaminophen (TYLENOL) 325 MG tablet Take 2 tablets (650 mg total) by mouth every 4 (four) hours as needed for mild pain. 10/25/14  Yes Ivan Anchors Love, PA-C  ALPRAZolam (XANAX) 0.5 MG tablet Take 1 tablet (0.5 mg total) by mouth 2 (two) times daily as needed for anxiety. 10/25/14  Yes Ivan Anchors Love, PA-C  amLODipine (NORVASC) 10 MG tablet Take 1 tablet (10 mg total) by mouth daily. 10/25/14  Yes Ivan Anchors Love, PA-C  atorvastatin (LIPITOR) 20 MG tablet Take 1 tablet (20 mg total) by mouth daily at 6 PM. 10/25/14  Yes Ivan Anchors Love, PA-C  cloNIDine (CATAPRES) 0.3 MG tablet Take 1 tablet (0.3 mg total) by mouth 3 (three) times daily. 10/25/14  Yes Ivan Anchors Love, PA-C  diclofenac sodium (VOLTAREN) 1 % GEL Apply 2 g topically 4 (four) times daily. To forefoot 10/25/14  Yes Bary Leriche, PA-C  ferrous sulfate 325 (65 FE) MG tablet Take 1 tablet (325 mg total) by mouth daily with breakfast. 10/25/14  Yes [place], PA-C  furosemide (LASIX)  80 MG tablet Take 1 tablet (80 mg total) by mouth 2 (two) times daily. 10/25/14  Yes Ivan Anchors Love, PA-C  gabapentin (NEURONTIN) 100 MG capsule Take 1 capsule (100 mg total) by mouth 3 (three) times daily. Start with 1 daily and taper up to three times daily 11/13/14  Yes Gay Filler Copland, MD  hydrALAZINE (APRESOLINE) 25 MG tablet Take 1 tablet (25 mg total) by mouth every 8 (eight) hours. 10/25/14  Yes Ivan Anchors Love, PA-C  hydroxychloroquine (PLAQUENIL) 200 MG tablet Take 2 tablets (400 mg total) by mouth daily. 10/25/14  Yes Ivan Anchors Love, PA-C  isosorbide mononitrate  (IMDUR) 60 MG 24 hr tablet Take 1 tablet (60 mg total) by mouth daily. 10/25/14  Yes Ivan Anchors Love, PA-C  labetalol (NORMODYNE) 200 MG tablet Take 2 tablets (400 mg total) by mouth 2 (two) times daily. 10/25/14  Yes Ivan Anchors Love, PA-C  Menthol-Methyl Salicylate (MUSCLE RUB) 10-15 % CREA Apply 1 application topically 3 (three) times daily before meals. 10/25/14  Yes Ivan Anchors Love, PA-C  multivitamin (RENA-VIT) TABS tablet Take 1 tablet by mouth daily.   Yes Historical Provider, MD  mycophenolate (CELLCEPT) 250 MG capsule Take 4 capsules (1,000 mg total) by mouth 2 (two) times daily. 10/25/14  Yes Ivan Anchors Love, PA-C  nicotine (NICODERM CQ - DOSED IN MG/24 HOURS) 14 mg/24hr patch Place 1 patch (14 mg total) onto the skin daily. 10/25/14  Yes Ivan Anchors Love, PA-C  nystatin-triamcinolone ointment (MYCOLOG) Apply 1 application topically 2 (two) times daily. 11/06/14  Yes Mercedes Strupp Camprubi-Soms, PA-C  omeprazole (PRILOSEC) 20 MG capsule Take 1 capsule (20 mg total) by mouth daily. 11/06/14  Yes Mercedes Strupp Camprubi-Soms, PA-C  pantoprazole (PROTONIX) 40 MG tablet Take 1 tablet (40 mg total) by mouth 2 (two) times daily. 10/25/14  Yes Ivan Anchors Love, PA-C  senna-docusate (SENOKOT-S) 8.6-50 MG per tablet Take 1 tablet by mouth 2 (two) times daily. 10/25/14  Yes Ivan Anchors Love, PA-C  sodium bicarbonate 650 MG tablet Take 2 tablets (1,300 mg total) by mouth 2 (two) times daily. 10/25/14  Yes Ivan Anchors Love, PA-C  Alum & Mag Hydroxide-Simeth (MAGIC MOUTHWASH W/LIDOCAINE) SOLN Take 5 mLs by mouth 3 (three) times daily as needed for mouth pain. Patient not taking: Reported on 11/18/2014 11/06/14   Patty Sermons Camprubi-Soms, PA-C  predniSONE (DELTASONE) 10 MG tablet Take two pills daily for three day. Then decrease to one pill X 3 days. Then decrease to 1/2 pill daily till gone. Patient not taking: Reported on 11/18/2014 10/25/14   Ivan Anchors Love, PA-C  Vitamin D, Ergocalciferol, (DRISDOL) 50000 UNITS CAPS  capsule Take 1 capsule (50,000 Units total) by mouth every Wednesday. 10/25/14   Bary Leriche, PA-C    Current Facility-Administered Medications  Medication Dose Route Frequency Provider Last Rate Last Dose  . 0.9 %  sodium chloride infusion   Intravenous Continuous Francine Graven, DO 100 mL/hr at 11/19/14 [phone]    . acetaminophen (TYLENOL) tablet 650 mg  650 mg Oral Q4H PRN Ivor Costa, MD      . ALPRAZolam Duanne Moron) tablet 0.5 mg  0.5 mg Oral BID PRN Ivor Costa, MD      . amLODipine (NORVASC) tablet 10 mg  10 mg Oral Daily Ivor Costa, MD      . atorvastatin (LIPITOR) tablet 20 mg  20 mg Oral q1800 Ivor Costa, MD      . ciprofloxacin (CIPRO) IVPB 400 mg  400 mg Intravenous Q24H Soledad Gerlach  Blaine Hamper, MD   400 mg at 11/19/14 [phone]  . cloNIDine (CATAPRES) tablet 0.3 mg  0.3 mg Oral TID Ivor Costa, MD      . diclofenac sodium (VOLTAREN) 1 % transdermal gel 2 g  2 g Topical QID Ivor Costa, MD      . ferrous sulfate tablet 325 mg  325 mg Oral Q breakfast Ivor Costa, MD      . gabapentin (NEURONTIN) capsule 100 mg  100 mg Oral TID Ivor Costa, MD      . hydrALAZINE (APRESOLINE) tablet 25 mg  25 mg Oral 3 times per day Ivor Costa, MD   25 mg at 11/19/14 0600  . hydroxychloroquine (PLAQUENIL) tablet 400 mg  400 mg Oral Daily Ivor Costa, MD      . isosorbide mononitrate (IMDUR) 24 hr tablet 60 mg  60 mg Oral Daily Ivor Costa, MD      . labetalol (NORMODYNE) tablet 400 mg  400 mg Oral BID Ivor Costa, MD      . metroNIDAZOLE (FLAGYL) IVPB 500 mg  500 mg Intravenous Q8H Ivor Costa, MD   500 mg at 11/19/14 0716  . morphine 2 MG/ML injection 2 mg  2 mg Intravenous Q3H PRN Ivor Costa, MD      . multivitamin (RENA-VIT) tablet 1 tablet  1 tablet Oral Daily Ivor Costa, MD      . MUSCLE RUB CREA 1 application  1 application Topical TID [facility] Ivor Costa, MD   1 application at 35/45/62 0800  . mycophenolate (CELLCEPT) capsule 1,000 mg  1,000 mg Oral BID Ivor Costa, MD      . nystatin-triamcinolone ointment (MYCOLOG) 1 application  1 application  Topical BID Ivor Costa, MD      . ondansetron [facility]) tablet 4 mg  4 mg Oral Q6H PRN Ivor Costa, MD       Or  . ondansetron [facility]) injection 4 mg  4 mg Intravenous Q6H PRN Ivor Costa, MD      . pantoprazole (PROTONIX) injection 40 mg  40 mg Intravenous Q12H Ivor Costa, MD   40 mg at 11/19/14 0545  . sodium bicarbonate tablet 1,300 mg  1,300 mg Oral BID Ivor Costa, MD      . sodium chloride 0.9 % injection 3 mL  3 mL Intravenous Q12H Ivor Costa, MD        Allergies as of 11/18/2014 - Review Complete 11/18/2014  Allergen Reaction Noted  . Sulfa antibiotics Itching 03/01/2012    Family History  Problem Relation Age of Onset  . Hypertension    . Lupus    . Rheum arthritis    . Hypertension Mother   . Diabetes Mother   . Hypertension Sister   . Diabetes Father   . Hypertension Maternal Grandmother     History   Social History  . Marital Status: Single    Spouse Name: N/A    Number of Children: N/A  . Years of Education: N/A   Occupational History  . Not on file.   Social History Main Topics  . Smoking status: Former Smoker -- 0.50 packs/day for 30 years    Types: Cigarettes  . Smokeless tobacco: Never Used  . Alcohol Use: No  . Drug Use: No  . Sexual Activity: Not on file   Other Topics Concern  . Not on file   Social History Narrative    Review of Systems: Pertinent positive and negative review of systems were noted in the above HPI section.  All other review of systems was otherwise negative.  Physical Exam: Vital signs in last 24 hours: Temp:  [98.1 F (36.7 C)-102.5 F (39.2 C)] 98.8 F (37.1 C) (01/17 1000) Pulse Rate:  [93-128] 101 (01/17 1015) Resp:  [13-35] 25 (01/17 1015) BP: (116-174)/(58-100) 160/62 mmHg (01/17 1000) SpO2:  [97 %-100 %] 100 % (01/17 1015) Weight:  [128 lb (58.06 kg)] 128 lb (58.06 kg) (01/17 0700) Last BM Date: 11/18/14 General:   Alert,  Well-developed, chronically ill-appearing African-American female uncomfortable but in no  acute distress Head:  Normocephalic and atraumatic. Eyes:  Sclera clear, no icterus.   Conjunctiva pale Ears:  Normal auditory acuity. Nose:  No deformity, discharge,  or lesions. Mouth:  No deformity or lesions.   Neck:  Supple; no masses or thyromegaly. Lungs:  Clear throughout to auscultation.   No wheezes, crackles, or rhonchi. Heart: Tachycardia Regular rate and rhythm; no  clicks, rubs,  or gallops.  Over 6 early systolic murmur Abdomen:  Soft, bowel sounds are present, she is quite tender in the left mid and left lower quadrant and across to the suprapubic area no rebound no palpable mass or hepatosplenomegaly  .  An abdominal bruit is present Rectal:  Deferred  Msk:  Symmetrical without gross deformities. . Pulses:  Normal pulses noted. Extremities:  Without clubbing or edema. Neurologic:  Alert and  oriented x4;  grossly normal neurologically. Skin:  Intact without significant lesions or rashes.. Psych:  Alert and cooperative. Normal mood and affect.  Intake/Output from previous day: 01/16 0701 - 01/17 0700 In: 335 [Blood:335] Out: 250 [Urine:250] Intake/Output this shift: Total I/O In: 430 [Blood:330; IV Piggyback:100] Out: -   Lab Results:  Recent Labs  11/18/14 2324 11/19/14 0603  WBC 5.6 5.3  HGB 5.4* 5.0*  HCT 16.9* 15.4*  PLT 245 206   BMET  Recent Labs  11/18/14 2324 11/19/14 0603  NA 129* 132*  K 4.5 4.0  CL 97 103  CO2 18* 16*  GLUCOSE 128* 97  BUN 59* 55*  CREATININE 3.57* 3.24*  CALCIUM 7.7* 7.3*   LFT  Recent Labs  11/18/14 2324  PROT 6.2  ALBUMIN 2.1*  AST 18  ALT 8  ALKPHOS 77  BILITOT 0.3   PT/INR  Recent Labs  11/19/14 0603  LABPROT 15.1  INR 1.18      IMPRESSION:  #39 54 year old female with multiple medical problems including lupus for which she is on immunosuppressive therapy with Plaquenil and CellCept and stage IV chronic kidney disease who was admitted with 1 week history of diarrhea, lower abdominal discomfort  ,and melena. Plan no abdominal films on admission questioned sigmoid thickening. Her symptoms are consistent with an acute colitis, unclear whether this is infectious or inflammatory. With lupus she would be at increased risk for an ischemic colitis #2 acute on chronic anemia #3 recent intracranial hemorrhage December 2015 #4 history of hypertension #5 membranous glomerulonephritis #6 rheumatoid arthritis #7 renal insufficiency #8 history of iron deficiency anemia #9 diabetes mellitus #10 remote history of hyperplastic colon polyps  PLAN: #1 continue empiric Cipro and Flagyl for now, pending results of stool cultures and stool for C. Difficile #2 serial hemoglobins and transfuse to keep hemoglobin close to 7 #3 start clear liquids #4  CT scan of the abdomen and pelvis with no IV contrast Will follow with you   Amy Esterwood  11/19/2014, 11:22 AM   GI Attending Note   Chart was reviewed and patient was examined. X-rays and lab  were reviewed.    I agree with management and plans.  Strongly suspect patient has an acute colitis responsible for pain, diarrhea and bleeding.  Favor ischemic colitis over infectious colitis.  Note abdominal bruit.  If stool pathogen study is negative would discontinue antibiotics.  Sandy Salaam. Deatra Ina, M.D., [facility] Cell [phone]

## 2014-11-19 NOTE — ED Notes (Signed)
Spoke with admitting physician regarding elevated temperature in regards to giving blood.  States give 500cc ns bolus that is ordered and then start the blood.

## 2014-11-19 NOTE — Progress Notes (Signed)
Report received that pt has been experiencing black diarrhea x 4-5 days; none noted since arrival to [facility]; pt placed on nurse driven c diff protocol; will continue to closely monitor; awaiting stool sample to send to lab

## 2014-11-20 DIAGNOSIS — D62 Acute posthemorrhagic anemia: Secondary | ICD-10-CM

## 2014-11-20 DIAGNOSIS — K529 Noninfective gastroenteritis and colitis, unspecified: Secondary | ICD-10-CM

## 2014-11-20 DIAGNOSIS — E43 Unspecified severe protein-calorie malnutrition: Secondary | ICD-10-CM | POA: Insufficient documentation

## 2014-11-20 DIAGNOSIS — R1032 Left lower quadrant pain: Secondary | ICD-10-CM

## 2014-11-20 DIAGNOSIS — D500 Iron deficiency anemia secondary to blood loss (chronic): Secondary | ICD-10-CM

## 2014-11-20 LAB — BASIC METABOLIC PANEL
Anion gap: 5 (ref 5–15)
BUN: 43 mg/dL — ABNORMAL HIGH (ref 6–23)
CALCIUM: 8.1 mg/dL — AB (ref 8.4–10.5)
CO2: 18 mmol/L — ABNORMAL LOW (ref 19–32)
CREATININE: 2.62 mg/dL — AB (ref 0.50–1.10)
Chloride: 111 mEq/L (ref 96–112)
GFR calc Af Amer: 23 mL/min — ABNORMAL LOW (ref >90)
GFR calc non Af Amer: 20 mL/min — ABNORMAL LOW (ref >90)
GLUCOSE: 104 mg/dL — AB (ref 70–99)
POTASSIUM: 3.7 mmol/L (ref 3.5–5.1)
Sodium: 134 mmol/L — ABNORMAL LOW (ref 135–145)

## 2014-11-20 LAB — CBC
HCT: 21.7 % — ABNORMAL LOW (ref 36.0–46.0)
HCT: 23.6 % — ABNORMAL LOW (ref 36.0–46.0)
HEMOGLOBIN: 7.9 g/dL — AB (ref 12.0–15.0)
Hemoglobin: 7.3 g/dL — ABNORMAL LOW (ref 12.0–15.0)
MCH: 28 pg (ref 26.0–34.0)
MCH: 28.6 pg (ref 26.0–34.0)
MCHC: 33.6 g/dL (ref 30.0–36.0)
MCHC: 33.5 g/dL (ref 30.0–36.0)
MCV: 83.1 fL (ref 78.0–100.0)
MCV: 85.5 fL (ref 78.0–100.0)
PLATELETS: 205 10*3/uL (ref 150–400)
Platelets: 216 10*3/uL (ref 150–400)
RBC: 2.61 MIL/uL — ABNORMAL LOW (ref 3.87–5.11)
RBC: 2.76 MIL/uL — ABNORMAL LOW (ref 3.87–5.11)
RDW: 14.5 % (ref 11.5–15.5)
RDW: 14.7 % (ref 11.5–15.5)
WBC: 4 10*3/uL (ref 4.0–10.5)
WBC: 3.3 10*3/uL — AB (ref 4.0–10.5)

## 2014-11-20 LAB — GLUCOSE, CAPILLARY: GLUCOSE-CAPILLARY: 95 mg/dL (ref 70–99)

## 2014-11-20 LAB — TYPE AND SCREEN
ABO/RH(D): O POS
ANTIBODY SCREEN: NEGATIVE
UNIT DIVISION: 0
Unit division: 0

## 2014-11-20 LAB — CORTISOL-AM, BLOOD: Cortisol - AM: 13.9 ug/dL (ref 4.3–22.4)

## 2014-11-20 MED ORDER — HYDRALAZINE HCL 50 MG PO TABS
50.0000 mg | ORAL_TABLET | Freq: Three times a day (TID) | ORAL | Status: DC
  Filled 2014-11-20 (×3): qty 1

## 2014-11-20 MED ORDER — BOOST / RESOURCE BREEZE PO LIQD
1.0000 | Freq: Two times a day (BID) | ORAL | Status: DC
  Administered 2014-11-20: 11:00:00 via ORAL
  Administered 2014-11-20 – 2014-11-24 (×7): 1 via ORAL

## 2014-11-20 MED ORDER — HYDRALAZINE HCL 50 MG PO TABS
75.0000 mg | ORAL_TABLET | Freq: Three times a day (TID) | ORAL | Status: DC
  Administered 2014-11-20: 25 mg via ORAL
  Filled 2014-11-20 (×2): qty 1

## 2014-11-20 MED ORDER — HYDRALAZINE HCL 50 MG PO TABS
50.0000 mg | ORAL_TABLET | Freq: Three times a day (TID) | ORAL | Status: DC
  Administered 2014-11-20 – 2014-11-21 (×3): 50 mg via ORAL
  Filled 2014-11-20 (×5): qty 1

## 2014-11-20 MED ORDER — WHITE PETROLATUM GEL
Status: AC
  Administered 2014-11-20: 0.2
  Filled 2014-11-20: qty 1

## 2014-11-20 NOTE — Progress Notes (Signed)
INITIAL NUTRITION ASSESSMENT  DOCUMENTATION CODES Per approved criteria  -Severe malnutrition in the context of chronic illness   Pt meets criteria of severe Malnutrition in the context of chronic illness as evidenced by 15% weight loss in 3 months and severe depletion of fat and muscle mass.    INTERVENTION: -RB BID providing 250 kcals and 9 grams of protein -Continue to monitor pt as diet advances  NUTRITION DIAGNOSIS: Inadaquate oral intake related to decreased appetite as evidenced by pt diet history of decreased appetite and intake and weight loss.   Goal: Pt to meet >/= 90% of need through meals and supplements.   Monitor:  Pt PO supplement intake, weight, appetite, labs  Reason for Assessment: MST=4  54 y.o. female  Admitting Dx: GIB (gastrointestinal bleeding)  ASSESSMENT: Pt admitted for GIB.  Hx of lupus, CKD, DM, GERD, RA, chronic anemia.  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  Has been doing well since except for past 4-5 days prior to admission pt reports abdominal pain and watery black stool 5-6 times a day.  C. Diff cultures are pending. Currently on immunosuppressants for lupus.    Glucose is slightly elevated, sodium is low and potassium is with in range.   Reports weight loss since November, usual weight 155lbs.  States clothes are too big for her.  Pt reports decrease in appetite since December due to medications.  Pt has consumed boost supplements at home and willing to try RB since she is on a clear liquid diet.  Pt consumed all of her breakfast except for the broth, which she claims she does not like, confirmed by RN. Pt reports eating a liquid diet over the past few days since onset of diarrhea.    Nutrition Focused Physical Exam:  Subcutaneous Fat:  Orbital Region: severe depletion Upper Arm Region: mild to moderate depletion Thoracic and Lumbar Region: severe depletion  Muscle:  Temple Region: severe depletion Clavicle  Bone Region: severe depletion Clavicle and Acromion Bone Region: severe depletion Scapular Bone Region: not accessed Dorsal Hand: mild depletion Patellar Region: not accessed Anterior Thigh Region: severe depletion Posterior Calf Region: severe depletion  Edema: none present    Height: Ht Readings from Last 1 Encounters:  11/19/14 5\' 5"  (1.651 m)    Weight: Wt Readings from Last 1 Encounters:  11/20/14 132 lb 4.4 oz (60 kg)    Ideal Body Weight: 125 lbs  % Ideal Body Weight: 106%  Wt Readings from Last 10 Encounters:  11/20/14 132 lb 4.4 oz (60 kg)  11/10/14 131 lb (59.421 kg)  11/06/14 149 lb (67.586 kg)  10/25/14 149 lb (67.586 kg)  10/17/14 156 lb 14.4 oz (71.169 kg)  10/05/14 146 lb 9.7 oz (66.5 kg)  09/28/14 142 lb 6.7 oz (64.6 kg)  09/04/12 135 lb (61.236 kg)  08/17/12 135 lb (61.236 kg)  08/10/12 133 lb 3.2 oz (60.419 kg)    Usual Body Weight: 155 lbs   % Usual Body Weight: 85%  BMI:  Body mass index is 22.01 kg/(m^2).  Estimated Nutritional Needs: Kcal: 1800-2100 kcals Protein: 75-85 g protien Fluid: >/= 1800 mL/day  Skin: Stage II Pressure Ulcers on Sacrum and Buttocks, open incision left breast  Diet Order: Diet clear liquid   Intake/Output Summary (Last 24 hours) at 11/20/14 0928 Last data filed at 11/20/14 0839  Gross per 24 hour  Intake 3448.75 ml  Output   1925 ml  Net 1523.75 ml    Last BM: 1/17   Labs:  Recent Labs Lab 11/18/14 2324 11/19/14 0603 11/20/14 0510  NA 129* 132* 134*  K 4.5 4.0 3.7  CL 97 103 111  CO2 18* 16* 18*  BUN 59* 55* 43*  CREATININE 3.57* 3.24* 2.62*  CALCIUM 7.7* 7.3* 8.1*  GLUCOSE 128* 97 104*    CBG (last 3)   Recent Labs  11/19/14 0821  GLUCAP 84    Scheduled Meds: . amLODipine  10 mg Oral Daily  . atorvastatin  20 mg Oral q1800  . ciprofloxacin  400 mg Intravenous Q24H  . cloNIDine  0.3 mg Oral TID  . diclofenac sodium  2 g Topical QID  . ferrous sulfate  325 mg Oral Q breakfast   . gabapentin  100 mg Oral TID  . hydrALAZINE  50 mg Oral 3 times per day  . hydroxychloroquine  400 mg Oral Daily  . isosorbide mononitrate  60 mg Oral Daily  . labetalol  400 mg Oral BID  . metronidazole  500 mg Intravenous Q8H  . multivitamin  1 tablet Oral Daily  . MUSCLE RUB  1 application Topical TID AC  . mycophenolate  1,000 mg Oral BID  . nystatin-triamcinolone ointment  1 application Topical BID  . pantoprazole (PROTONIX) IV  40 mg Intravenous Q12H  . sodium bicarbonate  1,300 mg Oral BID  . sodium chloride  3 mL Intravenous Q12H    Continuous Infusions: . sodium chloride 10 mL/hr (11/20/14 8299)    Past Medical History  Diagnosis Date  . Hypertension   . Lupus   . CKD (chronic kidney disease)     due to lupus/Dr. [name]  . Diabetes mellitus   . Stenosis of cervical spine region     with HNP at C5/6, C6/7  . Metatarsal bone fracture right    4th  . 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 Arthritis     RA  . Anemia   . Stroke 11/2014    left sided weakness, dysphagia  . Pain in joints   . Paresthesias     Past Surgical History  Procedure Laterality Date  . Breast biopsy      Elmer Picker MS Dietetic Intern Pager Number [phone]

## 2014-11-20 NOTE — Care Management Note (Addendum)
    Page 1 of 1   11/23/2014     4:30:49 PM CARE MANAGEMENT NOTE 11/23/2014  Patient:  [name] [name], [name] [name]   Account Number:  [id number]  Date Initiated:  11/20/2014  Documentation initiated by:  Elissa Hefty  Subjective/Objective Assessment:   adm w gi bleed     Action/Plan:   lives w fam   Anticipated DC Date:  11/24/2014   Anticipated DC Plan:  [place]  CM consult      [facility] Choice  Resumption Of Svcs/PTA Provider  HOME HEALTH   Choice offered to / List presented to:  C-1 Patient        [place] arranged  HH-1 RN  [place].   Status of service:  Completed, signed off Medicare Important Message given?  NO (If response is "NO", the following Medicare IM given date fields will be blank) Date Medicare IM given:   Medicare IM given by:   Date Additional Medicare IM given:   Additional Medicare IM given by:    Discharge Disposition:  [place]  Per UR Regulation:  Reviewed for med. necessity/level of care/duration of stay  If discussed at [place] of Stay Meetings, dates discussed:    Comments:  11/21/2014 0930 NCM spoke to pt and states she is active with [facility] for [facility]. Pt states she has RW and 3n1 at home. She has two son and dtr at home to assist with her care. Contacted [facility] for scheduled dc home today. Jonnie Finner RN CCM Case Mgmt phone [phone]

## 2014-11-20 NOTE — Evaluation (Signed)
Physical Therapy Evaluation Patient Details Name: [name] [name] MRN: [id number] DOB: Jan 10, 1961 Today's Date: 11/20/2014   History of Present Illness  54 y.o. female admitted to [facility] on 11/18/14 for abdominal pain, diarrhea, and black stool.  GI workup in progress.  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    Clinical Impression  Pt is generally weak and deconditioned, but overall, moving without much assistance. Her gait distance is limited due to fatigue and weakness and she needs to use a RW right now for safety and energy conservation.  Pt will likely progress well enough to go home with family's assist and resumption of HHPT at discharge.   PT to follow acutely for deficits listed below.       Follow Up Recommendations Home health PT    Equipment Recommendations  None recommended by PT    Recommendations for Other Services   NA    Precautions / Restrictions Precautions Precautions: Fall Precaution Comments: due to generalized weakness      Mobility  Bed Mobility Overal bed mobility: Modified Independent             General bed mobility comments: pt using bed rail to pull up to sitting.   Transfers Overall transfer level: Needs assistance Equipment used: Rolling walker (2 wheeled) Transfers: Sit to/from Stand Sit to Stand: Min guard         General transfer comment: Min guard assist for safety and balance during transitions, heavy reliance on upper extremity support.   Ambulation/Gait Ambulation/Gait assistance: Min guard Ambulation Distance (Feet): 30 Feet Assistive device: Rolling walker (2 wheeled) Gait  Pattern/deviations: Step-through pattern;Shuffle Gait velocity: decreased Gait velocity interpretation: Below normal speed for age/gender General Gait Details: pt visibly shaking due to weakness and fatigue during gait, so gait distance limited.  Min guard assist for safety.               Balance Overall balance assessment: Needs assistance Sitting-balance support: Feet supported;No upper extremity supported Sitting balance-Leahy Scale: Good     Standing balance support: Bilateral upper extremity supported;No upper extremity supported;Single extremity supported Standing balance-Leahy Scale: Fair Standing balance comment: Pt able to preform her own peri care after toileting and was able to stand at the sink and wash her hands without upper extremity support (however, pt was leaning trunk on sink).                              Pertinent Vitals/Pain Pain Assessment: Faces Faces Pain Scale: Hurts little more Pain Location: chronic pain bil knees, shoulders, voltaran gel applied by RN Pain Descriptors / Indicators: Aching Pain Intervention(s): Limited activity within patient's tolerance;Monitored during session;Repositioned;Premedicated before session    Home Living Family/patient expects to be discharged to:: Private residence Living Arrangements: Children Available Help at Discharge: Available 24 hours/day;Family   Home Access: Stairs to enter Entrance Stairs-Rails: Right Entrance Stairs-Number of Steps: 4 Home Layout: Two level;Able to live on main level with bedroom/bathroom Home Equipment: Dan Humphreys - 2 wheels;Cane - single point      Prior Function Level of Independence: Independent with assistive  device(s)         Comments: Pt reports on her bad days she uses the cane or the RW.       Hand Dominance   Dominant Hand: Right    Extremity/Trunk Assessment   Upper Extremity Assessment: Defer to OT evaluation           Lower Extremity Assessment:  Generalized weakness      Cervical / Trunk Assessment: Other exceptions  Communication   Communication: No difficulties  Cognition Arousal/Alertness: Awake/alert Behavior During Therapy: WFL for tasks assessed/performed Overall Cognitive Status: Within Functional Limits for tasks assessed (not specifically tested, but does have PMhx of ICH)                               Assessment/Plan    PT Assessment Patient needs continued PT services  PT Diagnosis Difficulty walking;Abnormality of gait;Generalized weakness   PT Problem List Decreased strength;Decreased activity tolerance;Decreased balance;Decreased mobility;Decreased knowledge of use of DME  PT Treatment Interventions DME instruction;Gait training;Stair training;Functional mobility training;Therapeutic activities;Therapeutic exercise;Balance training;Neuromuscular re-education;Patient/family education   PT Goals (Current goals can be found in the Care Plan section) Acute Rehab PT Goals Patient Stated Goal: to get strong enough to go home and resume her home therapy PT Goal Formulation: With patient Time For Goal Achievement: 12/04/14 Potential to Achieve Goals: Good    Frequency Min 3X/week           End of Session   Activity Tolerance: Patient limited by fatigue Patient left: in chair;with call bell/phone within reach Nurse Communication: Mobility status         Time: 1100-1134 PT Time Calculation (min) (ACUTE ONLY): 34 min   Charges:   PT Evaluation $Initial PT Evaluation Tier I: 1 Procedure PT Treatments $Therapeutic Activity: 8-22 mins        Khloie Hamada B. Aimy Sweeting, PT, DPT [phone]   11/20/2014, 11:47 AM

## 2014-11-20 NOTE — Consult Note (Addendum)
WOC approached to assist bedside nurse with inframammary intertriginous dermatitis. Antimicrobial wicking fabric ordered for patient. Instructions for use provided to the bedside nursing staff. Topical nystatin discontinued since Interdry Ag+ to be used instead.   Discussed POC with patient and bedside nurse.  Re consult if needed, will not follow at this time. Thanks Deztiny Sarra Kellogg, [place] [phone])

## 2014-11-20 NOTE — Progress Notes (Signed)
Report received from [facility] for patient to be transferred into 5w11

## 2014-11-20 NOTE — Progress Notes (Signed)
Progress Note   Subjective  wants to eat. No nausea. No diarrhea or any BM in a few days.    Objective   Vital signs in last 24 hours: Temp:  [98.3 F (36.8 C)-99.3 F (37.4 C)] 98.3 F (36.8 C) (01/18 0757) Pulse Rate:  [85-107] 86 (01/18 0800) Resp:  [14-34] 15 (01/18 0800) BP: (140-184)/(57-86) 166/79 mmHg (01/18 0800) SpO2:  [99 %-100 %] 99 % (01/18 0800) Weight:  [132 lb 4.4 oz (60 kg)] 132 lb 4.4 oz (60 kg) (01/18 0420) Last BM Date: 11/19/14 General:    black female in NAD Heart:  Regular rate and rhythm; no murmurs Abdomen:  Soft, nontender and nondistended. Normal bowel sounds. Neurologic:  Alert and oriented,  grossly normal neurologically. Psych:  Cooperative. Normal mood and affect.   Lab Results:  Recent Labs  11/19/14 1700 11/19/14 2319 11/20/14 0510  WBC 4.8 4.4 4.0  HGB 7.0* 7.6* 7.3*  HCT 21.0* 22.9* 21.7*  PLT 196 201 205   BMET  Recent Labs  11/18/14 2324 11/19/14 0603 11/20/14 0510  NA 129* 132* 134*  K 4.5 4.0 3.7  CL 97 103 111  CO2 18* 16* 18*  GLUCOSE 128* 97 104*  BUN 59* 55* 43*  CREATININE 3.57* 3.24* 2.62*  CALCIUM 7.7* 7.3* 8.1*   LFT  Recent Labs  11/18/14 2324  PROT 6.2  ALBUMIN 2.1*  AST 18  ALT 8  ALKPHOS 77  BILITOT 0.3   PT/INR  Recent Labs  11/19/14 0603  LABPROT 15.1  INR 1.18    Studies/Results: Ct Abdomen Pelvis Wo Contrast  11/19/2014   CLINICAL DATA:  53 y.o. female with past medical history of hypertension, lupus (on plaquenil and CellCept), chronic kidney disease-IV, recent history of intracranial hemorrhage, memberanous glomerulonephritis, GERD, history of adrenal insufficiency, who presents with abdominal pain, diarrhea and black stool. Initial encounter.  EXAM: CT ABDOMEN AND PELVIS WITHOUT CONTRAST  TECHNIQUE: Multidetector CT imaging of the abdomen and pelvis was performed following the standard protocol without IV contrast.  COMPARISON:  CT, 09/12/2009.  FINDINGS: Small effusions. The  heart mildly enlarged. The linear/reticular lung base opacity is noted dependently consistent with subsegmental atelectasis. No convincing pulmonary edema.  Liver, spleen, gallbladder, pancreas: Unremarkable. No bile duct dilation. No adrenal masses.  2.3 cm low-density left midpole renal mass consistent with a cyst, enlarged from the prior exam. No other renal masses, no stones and no hydronephrosis. Ureters normal course and in caliber. Bladder is unremarkable.  No pathologically enlarged lymph nodes.  Small amount of reach fluid in the posterior pelvic recess.  No colonic wall thickening or mesenteric inflammation. No evidence of obstruction. No bowel dilation. Normal small bowel. Normal appendix.  No osteoblastic or osteolytic lesions.  IMPRESSION: 1. No acute findings in the abdomen pelvis. 2. No bowel inflammatory change. No findings to explain patient's abdominal pain or diarrhea or black stools. 3. Small amount of free fluid in the pelvis, nonspecific in this patient with a history chronic kidney disease. Small pleural effusions. Convincing pulmonary edema.   Electronically Signed   By: Lajean Manes M.D.   On: 11/19/2014 21:59     Assessment / Plan:    23. 54 year old female admitted with diarrhea / lower abdominal pain. Plain abdominal films raised suspicion of sigmoid thickening but non contrast CTscan didn't corroborate. On Cipro / Flagyl. No further diarrhea or any BMs. Abdominal exam is benign, WBC normal. Per RN, stool samples misplaced and patient hasn't had  any BMs to obtain another sample. She could probably stop antibiotics.    .2. Acute on chronic anemia. Hgb 5 this admission, down from 8.6 earlier this month. Got 2 units of blood yesterday. Hgb rose appropriately to 7.3 today. She reports that diarrhea was black raising suspicion of upper GI bleed but patient does take iron and did have some recent bismuth to stop the already black diarrhea.  She has CKD so BUN elevation not really  helpful. Continue BID PPI. She can advance to solids from a GI standpoint.      LOS: 2 days   Tye Savoy  11/20/2014, 10:53 AM  GI ATTENDING  Patient's case reviewed in GI morning report with colleagues. Interval history and data reviewed. Patient personally seen and examined. Patient's family in room. Agree with progress note as outlined above. She is feeling better. No further abdominal pain. CT scan without contrast unrevealing. No bleeding. Hungry. The clinical suspicion was ischemic colitis. Mild. Agree with continuing PPI, advancing diet, and monitoring stools/blood counts. We'll follow.  Docia Chuck. Geri [place]in[place]., M.D. [facility] Division of Gastroenterology

## 2014-11-20 NOTE — Evaluation (Signed)
Occupational Therapy Evaluation Patient Details Name: [name] [name] MRN: [id number] DOB: 01/12/1961 Today's Date: 11/20/2014    History of Present Illness 54 y.o. female admitted to [facility] on 11/18/14 for abdominal pain, diarrhea, and black stool.  GI workup in progress.  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     Clinical Impression   Pt admitted with above. She demonstrates the below listed deficits and will benefit from continued OT to maximize safety and independence with BADLs.  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  Currently, she requires min guard assist with BADLs.   Anticipate she is close to her baseline level of functioning.  Will follow acutely and recommend resumption of HHOT at discharge.       Follow Up Recommendations  Home health OT;Supervision/Assistance - 24 hour    Equipment Recommendations  None recommended by OT    Recommendations for Other Services       Precautions / Restrictions Precautions Precautions: Fall Precaution Comments: h/o ICH with Lt sided weakness       Mobility Bed Mobility                  Transfers Overall transfer level: Needs assistance Equipment used: Rolling walker (2 wheeled) Transfers: Sit to/from [employer] Sit to Stand: Min guard Stand pivot transfers: Min guard       General transfer comment: min guard assist for safety and balance     Balance Overall balance assessment: Needs assistance Sitting-balance support: Feet supported Sitting balance-Leahy Scale: Good     Standing balance support: During  functional activity Standing balance-Leahy Scale: Fair                              ADL Overall ADL's : Needs assistance/impaired Eating/Feeding: Independent;Sitting   Grooming: Wash/dry hands;Min guard;Standing   Upper Body Bathing: Set up;Sitting   Lower Body Bathing: Min guard;Sit to/from stand   Upper Body Dressing : Set up;Sitting   Lower Body Dressing: Min guard;Sit to/from stand   Toilet Transfer: Min guard;Ambulation;Comfort height toilet;BSC;RW   Toileting- Architect and Hygiene: Min guard;Sit to/from stand       Functional mobility during ADLs: Min guard;Rolling walker General ADL Comments: Pt does fatigue with activity      Vision     Ocular Range of Motion: Within Functional Limits Tracking/Visual Pursuits: Able to track stimulus in all quads without difficulty             Perception     Praxis      Pertinent Vitals/Pain Pain Assessment: No/denies pain     Hand Dominance Right   Extremity/Trunk Assessment Upper Extremity Assessment Upper Extremity Assessment: LUE deficits/detail LUE Deficits / Details: Pt demonstrates full AROM with mild spasticity and weakness 4/5 noted  LUE Coordination: decreased gross motor;decreased fine motor   Lower Extremity Assessment Lower Extremity Assessment: Overall WFL for tasks assessed       Communication Communication Communication: No difficulties   Cognition Arousal/Alertness: Awake/alert Behavior During Therapy: WFL for tasks assessed/performed;Flat affect Overall Cognitive  Status: Within Functional Limits for tasks assessed (for tasks assessed. )                     General Comments       Exercises       Shoulder Instructions      Home Living Family/patient expects to be discharged to:: Private residence Living Arrangements: Children Available Help at Discharge: Available 24 hours/day;Family   Home Access: Stairs to enter Entrance Stairs-Number of Steps:  4 Entrance Stairs-Rails: Right Home Layout: Two level;Able to live on main level with bedroom/bathroom Alternate Level Stairs-Number of Steps: flight   Bathroom Shower/Tub: Walk-in shower;Door   [employer] Toilet: Handicapped height Bathroom Accessibility: Yes How Accessible: Accessible via walker Home Equipment: Walker - 2 wheels;Cane - single point;Shower seat;Bedside commode   Additional Comments: Pt reports she receives HHOT and PT      Prior Functioning/Environment Level of Independence: Independent with assistive device(s)        Comments: Pt reports on her bad days she uses the cane or the RW.      OT Diagnosis: Generalized weakness;Hemiplegia non-dominant side   OT Problem List: Decreased strength;Decreased activity tolerance;Impaired balance (sitting and/or standing);Decreased coordination;Impaired UE functional use   OT Treatment/Interventions: Self-care/ADL training;Neuromuscular education;DME and/or AE instruction;Therapeutic activities;Patient/family education;Balance training    OT Goals(Current goals can be found in the care plan section) Acute Rehab OT Goals Patient Stated Goal: to return home  OT Goal Formulation: With patient Time For Goal Achievement: 12/04/14 Potential to Achieve Goals: Good ADL Goals Pt Will Perform Grooming: with modified independence;standing Pt Will Perform Lower Body Bathing: with modified independence;sit to/from stand Pt Will Perform Lower Body Dressing: with modified independence;sit to/from stand Pt Will Transfer to Toilet: with modified independence;ambulating;regular height toilet;bedside commode;grab bars Pt Will Perform Toileting - Clothing Manipulation and hygiene: with modified independence;sit to/from stand Pt Will Perform Tub/Shower Transfer: Shower transfer;with supervision;ambulating;shower seat;rolling walker  OT Frequency: Min 2X/week   Barriers to D/C:            Co-evaluation              End of Session  Equipment Utilized During Treatment: Rolling walker Nurse Communication: Mobility status  Activity Tolerance: Patient tolerated treatment well Patient left: in chair;with call bell/phone within reach   Time: 1610-9604 OT Time Calculation (min): 20 min Charges:  OT General Charges $OT Visit: 1 Procedure OT Evaluation $Initial OT Evaluation Tier I: 1 Procedure OT Treatments $Self Care/Home Management : 8-22 mins G-Codes:    Wallis Spizzirri M 2014/12/11, 3:53 PM

## 2014-11-20 NOTE — Progress Notes (Signed)
Upon assessment (0800) this am, patient has 5 individual sores to buttocks and sacrum.  All of them approximately size of a pencil eraser.  Removed sacral foam dressing as it was saturated with urine, cleansed area with incontinent cleanser, dried and barrier cream applied.  Patient stated she has had sores on her bottom since November, encouraged her to lie on her side intermittently to allow it to stay dry and not hold moisture, she stated she would.  Will continue to monitor.   Left breast has open weeping, macerated area in skin fold, cleansed and applied nystatin at 0800 this am, after speaking with wound nurse that was on unit, changed order (d/c nystatin) and she has interdry under breast presently.  Per [place] interdry to stay in place for 5 days change out on 1/23

## 2014-11-20 NOTE — Progress Notes (Signed)
PATIENT DETAILS Name: [name] [name] Age: 54 y.o. Sex: female Date of Birth: 05/03/1961 Admit Date: 11/18/2014 Admitting Physician Ivor Costa, MD PCP:No primary care provider on file.  Subjective: No new complaints this am  Assessment/Plan: Principal Problem:   Lower GIB (gastrointestinal bleeding):initially suspected secondary to Colitis, however CT scan of the abdomen negative for any colitis. No further melena/black stools since admission. Continue to follow CBC. GI consulted and following.  Active Problems:   Suspected Colitis:black watery stools for 1 week,on immunosuppressives. Will continue Empiric Cipro/Flagyl. Await stool studies. But diarrhea seems to have resolved. CT of the abdomen negative.    Acute Blood Loss Anemia:has baseline chronic anemia secondary to CKD, acute anemia is secondary to blood loss from colitis. Transfused 2 units of PRBC on admission, hemoglobin currently up to 7.3.    Hyponatremia:better. Suspect this was secondary to dehydration.    Metabolic Acidosis:secondary to CKD, continue Bicarbonate    CKD stage 4:creatinine close to usual baseline. 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 Continue Cellcept.    Hx of SLE/RA:continue Cellcept and Plaquenil.    EFE:OFHQRFXJ control, continue Amlodipine,Clonidine,Imdur, Labetalol. Stop IV fluids, increase hydralazine to 75 mg 3 times a day.    Recent hx of OIT:GPQD residual mild left sided deficits.  Disposition: Remain inpatient-transfer to telemetry  Antibiotics:  See below   Anti-infectives    Start     Dose/Rate Route Frequency Ordered Stop   11/19/14 1000  hydroxychloroquine (PLAQUENIL) tablet 400 mg     400 mg Oral Daily 11/19/14 0530     11/19/14 0600  metroNIDAZOLE (FLAGYL) IVPB 500 mg     500 mg100 mL/hr over 60 Minutes Intravenous Every 8 hours 11/19/14 0530     11/19/14 0600  ciprofloxacin (CIPRO) IVPB 400 mg     400 mg200 mL/hr over 60 Minutes Intravenous  Every 24 hours 11/19/14 0539        DVT Prophylaxis: SCD's  Code Status: Full code  Family Communication None at bedside  Procedures:  None  CONSULTS:  GI  Time spent 40 minutes-which includes 50% of the time with face-to-face with patient/ family and coordinating care related to the above assessment and plan.  MEDICATIONS: Scheduled Meds: . amLODipine  10 mg Oral Daily  . atorvastatin  20 mg Oral q1800  . ciprofloxacin  400 mg Intravenous Q24H  . cloNIDine  0.3 mg Oral TID  . diclofenac sodium  2 g Topical QID  . feeding supplement (RESOURCE BREEZE)  1 Container Oral BID BM  . ferrous sulfate  325 mg Oral Q breakfast  . gabapentin  100 mg Oral TID  . hydrALAZINE  50 mg Oral 3 times per day  . hydroxychloroquine  400 mg Oral Daily  . isosorbide mononitrate  60 mg Oral Daily  . labetalol  400 mg Oral BID  . metronidazole  500 mg Intravenous Q8H  . multivitamin  1 tablet Oral Daily  . MUSCLE RUB  1 application Topical TID AC  . mycophenolate  1,000 mg Oral BID  . pantoprazole (PROTONIX) IV  40 mg Intravenous Q12H  . sodium bicarbonate  1,300 mg Oral BID  . sodium chloride  3 mL Intravenous Q12H   Continuous Infusions: . sodium chloride 10 mL/hr (11/20/14 0839)   PRN Meds:.acetaminophen, ALPRAZolam, hydrALAZINE, morphine injection, ondansetron **OR** ondansetron (ZOFRAN) IV    PHYSICAL EXAM: Vital signs in last 24 hours: Filed Vitals:   11/20/14 0757 11/20/14 0800 11/20/14 1058  11/20/14 1100  BP: 175/86 166/79 167/73 183/73  Pulse: 95 86  90  Temp: 98.3 F (36.8 C)   97.9 F (36.6 C)  TempSrc: Oral   Oral  Resp: 20 15  23   Height:      Weight:      SpO2: 99% 99%  100%    Weight change: 1.94 kg (4 lb 4.4 oz) Filed Weights   11/19/14 0700 11/20/14 0420  Weight: 58.06 kg (128 lb) 60 kg (132 lb 4.4 oz)   Body mass index is 22.01 kg/(m^2).   Gen Exam: Awake and alert.Sleepy when I walked in. Neck: Supple, No JVD.   Chest: B/L Clear.  No rales or  rhonchi CVS: S1 S2 Regular, no murmurs.  Abdomen: soft, BS +, tender in left abd, non distended. Extremities: no edema, lower extremities warm to touch. Neurologic:Mild Left sided residual weakness Skin: No Rash.   Wounds: N/A.    Intake/Output from previous day:  Intake/Output Summary (Last 24 hours) at 11/20/14 1309 Last data filed at 11/20/14 1300  Gross per 24 hour  Intake 2642.25 ml  Output   1475 ml  Net 1167.25 ml     LAB RESULTS: CBC  Recent Labs Lab 11/18/14 2324 11/19/14 0603 11/19/14 1133 11/19/14 1700 11/19/14 2319 11/20/14 0510  WBC 5.6 5.3 4.4 4.8 4.4 4.0  HGB 5.4* 5.0* 7.7* 7.0* 7.6* 7.3*  HCT 16.9* 15.4* 23.2* 21.0* 22.9* 21.7*  PLT 245 206 211 196 201 205  MCV 83.3 85.1 83.8 83.0 84.5 83.1  MCH 26.6 27.6 27.8 27.7 28.0 28.0  MCHC 32.0 32.5 33.2 33.3 33.2 33.6  RDW 14.1 14.2 13.8 14.2 14.4 14.5  LYMPHSABS 0.6*  --   --   --   --   --   MONOABS 0.4  --   --   --   --   --   EOSABS 0.0  --   --   --   --   --   BASOSABS 0.0  --   --   --   --   --     Chemistries   Recent Labs Lab 11/18/14 2324 11/19/14 0603 11/20/14 0510  NA 129* 132* 134*  K 4.5 4.0 3.7  CL 97 103 111  CO2 18* 16* 18*  GLUCOSE 128* 97 104*  BUN 59* 55* 43*  CREATININE 3.57* 3.24* 2.62*  CALCIUM 7.7* 7.3* 8.1*    CBG:  Recent Labs Lab 11/19/14 0821 11/20/14 0752  GLUCAP 84 95    GFR Estimated Creatinine Clearance: 22.6 mL/min (by C-G formula based on Cr of 2.62).  Coagulation profile  Recent Labs Lab 11/19/14 0603  INR 1.18    Cardiac Enzymes  Recent Labs Lab 11/19/14 0302  TROPONINI <0.03    Invalid input(s): POCBNP No results for input(s): DDIMER in the last 72 hours. No results for input(s): HGBA1C in the last 72 hours. No results for input(s): CHOL, HDL, LDLCALC, TRIG, CHOLHDL, LDLDIRECT in the last 72 hours. No results for input(s): TSH, T4TOTAL, T3FREE, THYROIDAB in the last 72 hours.  Invalid input(s): FREET3 No results for input(s):  VITAMINB12, FOLATE, FERRITIN, TIBC, IRON, RETICCTPCT in the last 72 hours.  Recent Labs  11/19/14 0302  LIPASE 28    Urine Studies No results for input(s): UHGB, CRYS in the last 72 hours.  Invalid input(s): UACOL, UAPR, USPG, UPH, UTP, UGL, UKET, UBIL, UNIT, UROB, ULEU, UEPI, UWBC, URBC, UBAC, CAST, UCOM, BILUA  MICROBIOLOGY: Recent Results (from the past 240  hour(s))  MRSA PCR Screening     Status: None   Collection Time: 11/19/14  5:30 AM  Result Value Ref Range Status   MRSA by PCR NEGATIVE NEGATIVE Final    Comment:        The GeneXpert MRSA Assay (FDA approved for NASAL specimens only), is one component of a comprehensive MRSA colonization surveillance program. It is not intended to diagnose MRSA infection nor to guide or monitor treatment for MRSA infections.   Culture, blood (routine x 2)     Status: None (Preliminary result)   Collection Time: 11/19/14  6:00 AM  Result Value Ref Range Status   Specimen Description BLOOD LEFT ARM  Final   Special Requests BOTTLES DRAWN AEROBIC AND ANAEROBIC 5CC  Final   Culture   Final           BLOOD CULTURE RECEIVED NO GROWTH TO DATE CULTURE WILL BE HELD FOR 5 DAYS BEFORE ISSUING A FINAL NEGATIVE REPORT Performed at [facility]    Report Status PENDING  Incomplete  Culture, blood (routine x 2)     Status: None (Preliminary result)   Collection Time: 11/19/14  6:54 AM  Result Value Ref Range Status   Specimen Description BLOOD RIGHT ARM  Final   Special Requests BOTTLES DRAWN AEROBIC AND ANAEROBIC 10CC  Final   Culture   Final           BLOOD CULTURE RECEIVED NO GROWTH TO DATE CULTURE WILL BE HELD FOR 5 DAYS BEFORE ISSUING A FINAL NEGATIVE REPORT Performed at [facility]    Report Status PENDING  Incomplete    RADIOLOGY STUDIES/RESULTS: Ct Abdomen Pelvis Wo Contrast  11/19/2014   CLINICAL DATA:  54 y.o. female with past medical history of hypertension, lupus (on plaquenil and CellCept), chronic kidney  disease-IV, recent history of intracranial hemorrhage, memberanous glomerulonephritis, GERD, history of adrenal insufficiency, who presents with abdominal pain, diarrhea and black stool. Initial encounter.  EXAM: CT ABDOMEN AND PELVIS WITHOUT CONTRAST  TECHNIQUE: Multidetector CT imaging of the abdomen and pelvis was performed following the standard protocol without IV contrast.  COMPARISON:  CT, 09/12/2009.  FINDINGS: Small effusions. The heart mildly enlarged. The linear/reticular lung base opacity is noted dependently consistent with subsegmental atelectasis. No convincing pulmonary edema.  Liver, spleen, gallbladder, pancreas: Unremarkable. No bile duct dilation. No adrenal masses.  2.3 cm low-density left midpole renal mass consistent with a cyst, enlarged from the prior exam. No other renal masses, no stones and no hydronephrosis. Ureters normal course and in caliber. Bladder is unremarkable.  No pathologically enlarged lymph nodes.  Small amount of reach fluid in the posterior pelvic recess.  No colonic wall thickening or mesenteric inflammation. No evidence of obstruction. No bowel dilation. Normal small bowel. Normal appendix.  No osteoblastic or osteolytic lesions.  IMPRESSION: 1. No acute findings in the abdomen pelvis. 2. No bowel inflammatory change. No findings to explain patient's abdominal pain or diarrhea or black stools. 3. Small amount of free fluid in the pelvis, nonspecific in this patient with a history chronic kidney disease. Small pleural effusions. Convincing pulmonary edema.   Electronically Signed   By: Lajean Manes M.D.   On: 11/19/2014 21:59   Dg Chest 2 View  11/06/2014   CLINICAL DATA:  Initial evaluation for dysphagia and chest pain, shortness of breath, personal history of lupus and stroke, patient smokes  EXAM: CHEST  2 VIEW  COMPARISON:  10/10/2014  FINDINGS: Mild cardiac enlargement. Vascular pattern is  normal. No consolidation or effusion. No change from prior study.   IMPRESSION: Stable cardiac enlargement with no acute findings   Electronically Signed   By: Skipper Cliche M.D.   On: 11/06/2014 14:29   Dg Abd Acute W/chest  11/19/2014   CLINICAL DATA:  Diarrhea for 2 weeks.  EXAM: ACUTE ABDOMEN SERIES (ABDOMEN 2 VIEW & CHEST 1 VIEW)  COMPARISON:  Chest radiograph 11/06/2014  FINDINGS: Mild cardiomegaly is stable. The lungs are clear. There is no free intra-abdominal air. No dilated bowel loops to suggest obstruction. There is questionable colonic wall thickening involving the rectosigmoid colon. No radiopaque calculi. No acute osseous abnormalities are seen.  IMPRESSION: 1. Nonobstructive bowel gas pattern. No free air. There is questionable wall thickening of the sigmoid colon, may reflect enteritis/colitis. 2. Stable mild cardiomegaly.   Electronically Signed   By: Jeb Levering M.D.   On: 11/19/2014 02:16    Oren Binet, MD  [facility] Pager:[phone]  If 7PM-7AM, please contact night-coverage [email or web address] Password [email or web address] 11/20/2014, 1:09 PM   LOS: 2 days

## 2014-11-20 NOTE — Progress Notes (Signed)
NURSING PROGRESS NOTE  [facility] [id number] Transfer Data: 11/20/2014 7:25 PM Attending Provider: Jonetta Osgood, MD PCP:No primary care provider on file. Code Status: FULL  [name] PLAUGHER is a 54 y.o. female patient transferred from [place] -No acute distress noted.  -No complaints of shortness of breath.  -No complaints of chest pain.   Cardiac Monitoring: Box # 13 in place. Cardiac monitor yields:normal sinus rhythm.  Blood pressure 142/67, pulse 84, temperature 98.1 F (36.7 C), temperature source Oral, resp. rate 16, height 5\' 5"  (1.651 m), weight 60 kg (132 lb 4.4 oz), last menstrual period 07/18/2012, SpO2 100 %.   IV Fluids:  Peripheral IV intact to RFA and LFA infusing NS  Allergies:  Sulfa antibiotics  Past Medical History:   has a past medical history of Hypertension; Lupus; CKD (chronic kidney disease); Diabetes mellitus; Stenosis of cervical spine region; Metatarsal bone fracture (right); Chronic ankle pain; Membranous glomerulonephritis; Arthritis; Anemia; Stroke (11/2014); Pain in joints; and Paresthesias.  Past Surgical History:   has past surgical history that includes Breast biopsy.   Skin: multiple opened small circular areas to buttocks fold. MSAD noted to L breast area raw and red.   Patient/Family orientated to room. Information packet given to patient/family. Admission inpatient armband information verified with patient/family to include name and date of birth and placed on patient arm. Side rails up x 2, fall assessment and education completed with patient/family. Patient/family able to verbalize understanding of risk associated with falls and verbalized understanding to call for assistance before getting out of bed. Call light within reach. Patient/family able to voice and demonstrate understanding of unit orientation instructions.    Will continue to evaluate and treat per MD orders.  Wallie Renshaw, RN

## 2014-11-20 NOTE — Progress Notes (Signed)
[facility]  Patient Status: Active (receiving services up to time of hospitalization)  [facility] is providing the following services: RN, PT, OT and ST  If patient discharges after hours, please call [phone].   [name] [name] 11/20/2014, 11:34 AM

## 2014-11-21 ENCOUNTER — Encounter: Payer: Self-pay | Admitting: Gastroenterology

## 2014-11-21 DIAGNOSIS — M6281 Muscle weakness (generalized): Secondary | ICD-10-CM

## 2014-11-21 DIAGNOSIS — E871 Hypo-osmolality and hyponatremia: Secondary | ICD-10-CM

## 2014-11-21 DIAGNOSIS — I10 Essential (primary) hypertension: Secondary | ICD-10-CM

## 2014-11-21 DIAGNOSIS — R251 Tremor, unspecified: Secondary | ICD-10-CM

## 2014-11-21 DIAGNOSIS — R269 Unspecified abnormalities of gait and mobility: Secondary | ICD-10-CM | POA: Diagnosis not present

## 2014-11-21 DIAGNOSIS — I6931 Cognitive deficits following cerebral infarction: Secondary | ICD-10-CM

## 2014-11-21 DIAGNOSIS — M329 Systemic lupus erythematosus, unspecified: Secondary | ICD-10-CM

## 2014-11-21 DIAGNOSIS — E785 Hyperlipidemia, unspecified: Secondary | ICD-10-CM

## 2014-11-21 DIAGNOSIS — I69398 Other sequelae of cerebral infarction: Secondary | ICD-10-CM

## 2014-11-21 LAB — URINALYSIS, ROUTINE W REFLEX MICROSCOPIC
BILIRUBIN URINE: NEGATIVE
Glucose, UA: 100 mg/dL — AB
Hgb urine dipstick: NEGATIVE
Ketones, ur: NEGATIVE mg/dL
NITRITE: NEGATIVE
Protein, ur: >300 mg/dL — AB
Specific Gravity, Urine: 1.014 (ref 1.005–1.030)
Urobilinogen, UA: 0.2 mg/dL (ref 0.0–1.0)
pH: 5.5 (ref 5.0–8.0)

## 2014-11-21 LAB — TSH: TSH: 2.468 u[IU]/mL (ref 0.350–4.500)

## 2014-11-21 LAB — BASIC METABOLIC PANEL
Anion gap: 11 (ref 5–15)
BUN: 37 mg/dL — AB (ref 6–23)
CO2: 16 mmol/L — ABNORMAL LOW (ref 19–32)
Calcium: 7.8 mg/dL — ABNORMAL LOW (ref 8.4–10.5)
Chloride: 108 mEq/L (ref 96–112)
Creatinine, Ser: 2.71 mg/dL — ABNORMAL HIGH (ref 0.50–1.10)
GFR calc Af Amer: 22 mL/min — ABNORMAL LOW (ref >90)
GFR, EST NON AFRICAN AMERICAN: 19 mL/min — AB (ref >90)
Glucose, Bld: 111 mg/dL — ABNORMAL HIGH (ref 70–99)
Potassium: 3.8 mmol/L (ref 3.5–5.1)
SODIUM: 135 mmol/L (ref 135–145)

## 2014-11-21 LAB — CBC
HCT: 22.9 % — ABNORMAL LOW (ref 36.0–46.0)
Hemoglobin: 7.6 g/dL — ABNORMAL LOW (ref 12.0–15.0)
MCH: 27.5 pg (ref 26.0–34.0)
MCHC: 33.2 g/dL (ref 30.0–36.0)
MCV: 83 fL (ref 78.0–100.0)
Platelets: 221 10*3/uL (ref 150–400)
RBC: 2.76 MIL/uL — AB (ref 3.87–5.11)
RDW: 14.5 % (ref 11.5–15.5)
WBC: 3.7 10*3/uL — ABNORMAL LOW (ref 4.0–10.5)

## 2014-11-21 LAB — URINE MICROSCOPIC-ADD ON

## 2014-11-21 LAB — GLUCOSE, CAPILLARY: GLUCOSE-CAPILLARY: 109 mg/dL — AB (ref 70–99)

## 2014-11-21 LAB — AMMONIA: AMMONIA: 33 umol/L — AB (ref 11–32)

## 2014-11-21 MED ORDER — PREDNISONE 50 MG PO TABS
60.0000 mg | ORAL_TABLET | Freq: Every day | ORAL | Status: DC
  Administered 2014-11-22 – 2014-11-24 (×3): 60 mg via ORAL
  Filled 2014-11-21 (×4): qty 1

## 2014-11-21 MED ORDER — PANTOPRAZOLE SODIUM 40 MG PO TBEC
40.0000 mg | DELAYED_RELEASE_TABLET | Freq: Every day | ORAL | Status: DC
  Administered 2014-11-22 – 2014-11-25 (×4): 40 mg via ORAL
  Filled 2014-11-21 (×4): qty 1

## 2014-11-21 MED ORDER — ACETAMINOPHEN 325 MG PO TABS
650.0000 mg | ORAL_TABLET | ORAL | Status: DC | PRN
  Administered 2014-11-21 (×2): 650 mg via ORAL
  Filled 2014-11-21 (×2): qty 2

## 2014-11-21 MED ORDER — OXYCODONE HCL 5 MG PO TABS
5.0000 mg | ORAL_TABLET | Freq: Four times a day (QID) | ORAL | Status: DC | PRN
  Administered 2014-11-21: 5 mg via ORAL
  Filled 2014-11-21: qty 1

## 2014-11-21 NOTE — Progress Notes (Signed)
Patient having tremors-both at rest/intention. Also developing arthralgias-currently in her B/L ankles. 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 Renal then had recommended that hydralazine be discontinued. It seems that patient was restarted on Hydralaxine at discharge from [facility]. Since having new onset arthralgias, will stop Hydralazine, and start 60 mg of Prednisone. Not sure what to make of the tremors at this point, case was d/w Dr Katha Cabal did not think that Cellcept was the cause. Have consulted Neuro for further recommendations.

## 2014-11-21 NOTE — Progress Notes (Signed)
MD Ghimire notified that patient's temp 101.8. Patient reported that she feels "shaky." Patient also complained of 8/10 leg pain. Orders received.

## 2014-11-21 NOTE — Progress Notes (Signed)
MD notified due to acute change in patient status. Vital signs and labs are listed below.  MD notified(1st page) Time of 1st page:  Sloan Leiter Responding MD:  Sloan Leiter Time MD responded: 1:30 PM MD response: MD stated aware  Vital Signs Filed Vitals:   11/20/14 2106 11/21/14 0554 11/21/14 0926 11/21/14 1316  BP: 130/84 149/80 141/97 145/69  Pulse:    108  Temp: 98.6 F (37 C) 99.4 F (37.4 C)  101.8 F (38.8 C)  TempSrc: Oral Oral  Oral  Resp: 20 17  24   Height:      Weight:  59.6 kg (131 lb 6.3 oz)    SpO2: 100% 99%  98%     Lab Results WBC  Date/Time Value Ref Range Status  11/21/2014 05:16 AM 3.7* 4.0 - 10.5 K/uL Final  11/20/2014 04:45 PM 3.3* 4.0 - 10.5 K/uL Final  11/20/2014 05:10 AM 4.0 4.0 - 10.5 K/uL Final  03/01/2012 11:28 AM 5.5 4.6 - 10.2 K/uL Final   NEUTROPHILS RELATIVE %  Date/Time Value Ref Range Status  11/18/2014 11:24 PM 81* 43 - 77 % Final  10/18/2014 04:32 AM 87* 43 - 77 % Final  10/13/2014 03:16 AM 95* 43 - 77 % Final   PCO2 ARTERIAL  Date/Time Value Ref Range Status  10/04/2014 12:00 PM 24.4* 35.0 - 45.0 mmHg Final   LACTIC ACID, VENOUS  Date/Time Value Ref Range Status  11/18/2014 12:18 AM 1.0 0.5 - 2.2 mmol/L Final  10/10/2014 09:38 AM 0.5 0.5 - 2.2 mmol/L Final  10/04/2014 10:00 AM 0.5 0.5 - 2.2 mmol/L Final   No results found for: PCO2VEN   Jaquasha Carnevale L, RN 11/21/2014, 1:49 PM

## 2014-11-21 NOTE — Progress Notes (Signed)
PATIENT DETAILS Name: [name] [name] Age: 54 y.o. Sex: female Date of Birth: 1961/09/12 Admit Date: 11/18/2014 Admitting Physician Ivor Costa, MD PCP:No primary care provider on file.  Brief narrative  76-day-old female with a history of lupus on immunosuppressive agents, recent history of ICH,stage IV chronic kidney, anemia of chronic disease disease admitted for 1 week history of loose black stools. She was found to have a hemoglobin of 5.4 on admission (8.6 on 11/06/14). She was admitted and transfused 2 units of PRBC. Gastroenterology was consulted, no endoscopic procedures has been recommended, current suspicion is for suspected ischemic colitis and resultant lower GI bleed with acute blood loss anemia. Hemoglobin has been stable posttransfusion. Plans are to discontinue antibiotics today, watch another 24 hours before discharge.  Subjective: No new complaints this am  Assessment/Plan: Principal Problem:   Lower GIB (gastrointestinal bleeding):suspected secondary to Ischemic Colitis although CT of the abdomen did not show any evidence of colitis. Transfused 2 units of PRBC, posttransfusion hemoglobin remained stable. Gastroenterology following, no plans for endoscopic procedures at this time.  Active Problems:   Suspected ischemic Colitis: Given history of being on immunosuppressive, and loose stools, was empirically started on Cipro/Flagyl. CT of the abdomen did not show any major abnormalities. No further diarrhea-in fact no further bowel movement since admission. Will discontinue Cipro/Flagyl today, monitor for another 24 hours, if hemoglobin continues to remain stable, suspect can be discharged on 11/22/14.    Acute Blood Loss Anemia:has baseline chronic anemia secondary to CKD(8.6 on 11/06/14)., acute anemia is secondary to blood loss from colitis. Transfused 2 units of PRBC on admission, hemoglobin currently up to 7.6.    Hyponatremia:Resolved. Suspect this was secondary to  dehydration.    Metabolic Acidosis:secondary to CKD, continue Bicarbonate    CKD stage 4:creatinine close to usual baseline. 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 Continue Cellcept.    Hx of SLE/RA:continue Cellcept and Plaquenil.    HTN: moderate control, continue Amlodipine,Clonidine,Imdur, Labetalol andHydralazine.    Recent hx of NGE:XBMW residual mild left sided deficits.  Disposition: Remain inpatient-if Hb stable-suspect can be discharged on 1/20  Antibiotics:  See below   Anti-infectives    Start     Dose/Rate Route Frequency Ordered Stop   11/19/14 1000  hydroxychloroquine (PLAQUENIL) tablet 400 mg     400 mg Oral Daily 11/19/14 0530     11/19/14 0600  metroNIDAZOLE (FLAGYL) IVPB 500 mg  Status:  Discontinued     500 mg100 mL/hr over 60 Minutes Intravenous Every 8 hours 11/19/14 0530 11/21/14 1032   11/19/14 0600  ciprofloxacin (CIPRO) IVPB 400 mg  Status:  Discontinued     400 mg200 mL/hr over 60 Minutes Intravenous Every 24 hours 11/19/14 0539 11/21/14 1032      DVT Prophylaxis: SCD's  Code Status: Full code  Family Communication None at bedside  Procedures:  None  CONSULTS:  GI  MEDICATIONS: Scheduled Meds: . amLODipine  10 mg Oral Daily  . atorvastatin  20 mg Oral q1800  . cloNIDine  0.3 mg Oral TID  . diclofenac sodium  2 g Topical QID  . feeding supplement (RESOURCE BREEZE)  1 Container Oral BID BM  . ferrous sulfate  325 mg Oral Q breakfast  . gabapentin  100 mg Oral TID  . hydrALAZINE  50 mg Oral 3 times per day  . hydroxychloroquine  400 mg Oral Daily  . isosorbide mononitrate  60 mg Oral Daily  . labetalol  400 mg Oral BID  . multivitamin  1 tablet Oral Daily  . MUSCLE RUB  1 application Topical TID AC  . mycophenolate  1,000 mg Oral BID  . pantoprazole  40 mg Oral Q0600  . sodium bicarbonate  1,300 mg Oral BID  . sodium chloride  3 mL Intravenous Q12H   Continuous Infusions:   PRN Meds:.acetaminophen,  ALPRAZolam, hydrALAZINE, morphine injection, ondansetron **OR** ondansetron (ZOFRAN) IV    PHYSICAL EXAM: Vital signs in last 24 hours: Filed Vitals:   11/20/14 1829 11/20/14 2106 11/21/14 0554 11/21/14 0926  BP: 142/67 130/84 149/80 141/97  Pulse: 84     Temp: 98.1 F (36.7 C) 98.6 F (37 C) 99.4 F (37.4 C)   TempSrc: Oral Oral Oral   Resp: 16 20 17    Height:      Weight:   59.6 kg (131 lb 6.3 oz)   SpO2: 100% 100% 99%     Weight change: -0.4 kg (-14.1 oz) Filed Weights   11/19/14 0700 11/20/14 0420 11/21/14 0554  Weight: 58.06 kg (128 lb) 60 kg (132 lb 4.4 oz) 59.6 kg (131 lb 6.3 oz)   Body mass index is 21.87 kg/(m^2).   Gen Exam: Awake and alert.Sleepy when I walked in. Neck: Supple, No JVD.   Chest: B/L Clear.  No rales or rhonchi CVS: S1 S2 Regular, no murmurs.  Abdomen: soft, BS +,  non distended and not tender Extremities: no edema, lower extremities warm to touch. Neurologic:Mild Left sided residual weakness Skin: No Rash.   Wounds: N/A.    Intake/Output from previous day:  Intake/Output Summary (Last 24 hours) at 11/21/14 1128 Last data filed at 11/21/14 1009  Gross per 24 hour  Intake 1495.33 ml  Output      0 ml  Net 1495.33 ml     LAB RESULTS: CBC  Recent Labs Lab 11/18/14 2324  11/19/14 1700 11/19/14 2319 11/20/14 0510 11/20/14 1645 11/21/14 0516  WBC 5.6  < > 4.8 4.4 4.0 3.3* 3.7*  HGB 5.4*  < > 7.0* 7.6* 7.3* 7.9* 7.6*  HCT 16.9*  < > 21.0* 22.9* 21.7* 23.6* 22.9*  PLT 245  < > 196 201 205 216 221  MCV 83.3  < > 83.0 84.5 83.1 85.5 83.0  MCH 26.6  < > 27.7 28.0 28.0 28.6 27.5  MCHC 32.0  < > 33.3 33.2 33.6 33.5 33.2  RDW 14.1  < > 14.2 14.4 14.5 14.7 14.5  LYMPHSABS 0.6*  --   --   --   --   --   --   MONOABS 0.4  --   --   --   --   --   --   EOSABS 0.0  --   --   --   --   --   --   BASOSABS 0.0  --   --   --   --   --   --   < > = values in this interval not displayed.  Chemistries   Recent Labs Lab 11/18/14 2324  11/19/14 0603 11/20/14 0510 11/21/14 0516  NA 129* 132* 134* 135  K 4.5 4.0 3.7 3.8  CL 97 103 111 108  CO2 18* 16* 18* 16*  GLUCOSE 128* 97 104* 111*  BUN 59* 55* 43* 37*  CREATININE 3.57* 3.24* 2.62* 2.71*  CALCIUM 7.7* 7.3* 8.1* 7.8*    CBG:  Recent Labs Lab 11/19/14 0821 11/20/14 0752 11/21/14 0811  GLUCAP 84 95 109*  GFR Estimated Creatinine Clearance: 21.9 mL/min (by C-G formula based on Cr of 2.71).  Coagulation profile  Recent Labs Lab 11/19/14 0603  INR 1.18    Cardiac Enzymes  Recent Labs Lab 11/19/14 0302  TROPONINI <0.03    Invalid input(s): POCBNP No results for input(s): DDIMER in the last 72 hours. No results for input(s): HGBA1C in the last 72 hours. No results for input(s): CHOL, HDL, LDLCALC, TRIG, CHOLHDL, LDLDIRECT in the last 72 hours. No results for input(s): TSH, T4TOTAL, T3FREE, THYROIDAB in the last 72 hours.  Invalid input(s): FREET3 No results for input(s): VITAMINB12, FOLATE, FERRITIN, TIBC, IRON, RETICCTPCT in the last 72 hours.  Recent Labs  11/19/14 0302  LIPASE 28    Urine Studies No results for input(s): UHGB, CRYS in the last 72 hours.  Invalid input(s): UACOL, UAPR, USPG, UPH, UTP, UGL, UKET, UBIL, UNIT, UROB, ULEU, UEPI, UWBC, URBC, UBAC, CAST, UCOM, BILUA  MICROBIOLOGY: Recent Results (from the past 240 hour(s))  MRSA PCR Screening     Status: None   Collection Time: 11/19/14  5:30 AM  Result Value Ref Range Status   MRSA by PCR NEGATIVE NEGATIVE Final    Comment:        The GeneXpert MRSA Assay (FDA approved for NASAL specimens only), is one component of a comprehensive MRSA colonization surveillance program. It is not intended to diagnose MRSA infection nor to guide or monitor treatment for MRSA infections.   Culture, blood (routine x 2)     Status: None (Preliminary result)   Collection Time: 11/19/14  6:00 AM  Result Value Ref Range Status   Specimen Description BLOOD LEFT ARM  Final    Special Requests BOTTLES DRAWN AEROBIC AND ANAEROBIC 5CC  Final   Culture   Final           BLOOD CULTURE RECEIVED NO GROWTH TO DATE CULTURE WILL BE HELD FOR 5 DAYS BEFORE ISSUING A FINAL NEGATIVE REPORT Performed at [facility]    Report Status PENDING  Incomplete  Culture, blood (routine x 2)     Status: None (Preliminary result)   Collection Time: 11/19/14  6:54 AM  Result Value Ref Range Status   Specimen Description BLOOD RIGHT ARM  Final   Special Requests BOTTLES DRAWN AEROBIC AND ANAEROBIC 10CC  Final   Culture   Final           BLOOD CULTURE RECEIVED NO GROWTH TO DATE CULTURE WILL BE HELD FOR 5 DAYS BEFORE ISSUING A FINAL NEGATIVE REPORT Performed at [facility]    Report Status PENDING  Incomplete    RADIOLOGY STUDIES/RESULTS: Ct Abdomen Pelvis Wo Contrast  11/19/2014   CLINICAL DATA:  54 y.o. female with past medical history of hypertension, lupus (on plaquenil and CellCept), chronic kidney disease-IV, recent history of intracranial hemorrhage, memberanous glomerulonephritis, GERD, history of adrenal insufficiency, who presents with abdominal pain, diarrhea and black stool. Initial encounter.  EXAM: CT ABDOMEN AND PELVIS WITHOUT CONTRAST  TECHNIQUE: Multidetector CT imaging of the abdomen and pelvis was performed following the standard protocol without IV contrast.  COMPARISON:  CT, 09/12/2009.  FINDINGS: Small effusions. The heart mildly enlarged. The linear/reticular lung base opacity is noted dependently consistent with subsegmental atelectasis. No convincing pulmonary edema.  Liver, spleen, gallbladder, pancreas: Unremarkable. No bile duct dilation. No adrenal masses.  2.3 cm low-density left midpole renal mass consistent with a cyst, enlarged from the prior exam. No other renal masses, no stones and no hydronephrosis. Ureters normal  course and in caliber. Bladder is unremarkable.  No pathologically enlarged lymph nodes.  Small amount of reach fluid in the  posterior pelvic recess.  No colonic wall thickening or mesenteric inflammation. No evidence of obstruction. No bowel dilation. Normal small bowel. Normal appendix.  No osteoblastic or osteolytic lesions.  IMPRESSION: 1. No acute findings in the abdomen pelvis. 2. No bowel inflammatory change. No findings to explain patient's abdominal pain or diarrhea or black stools. 3. Small amount of free fluid in the pelvis, nonspecific in this patient with a history chronic kidney disease. Small pleural effusions. Convincing pulmonary edema.   Electronically Signed   By: Lajean Manes M.D.   On: 11/19/2014 21:59   Dg Chest 2 View  11/06/2014   CLINICAL DATA:  Initial evaluation for dysphagia and chest pain, shortness of breath, personal history of lupus and stroke, patient smokes  EXAM: CHEST  2 VIEW  COMPARISON:  10/10/2014  FINDINGS: Mild cardiac enlargement. Vascular pattern is normal. No consolidation or effusion. No change from prior study.  IMPRESSION: Stable cardiac enlargement with no acute findings   Electronically Signed   By: Skipper Cliche M.D.   On: 11/06/2014 14:29   Dg Abd Acute W/chest  11/19/2014   CLINICAL DATA:  Diarrhea for 2 weeks.  EXAM: ACUTE ABDOMEN SERIES (ABDOMEN 2 VIEW & CHEST 1 VIEW)  COMPARISON:  Chest radiograph 11/06/2014  FINDINGS: Mild cardiomegaly is stable. The lungs are clear. There is no free intra-abdominal air. No dilated bowel loops to suggest obstruction. There is questionable colonic wall thickening involving the rectosigmoid colon. No radiopaque calculi. No acute osseous abnormalities are seen.  IMPRESSION: 1. Nonobstructive bowel gas pattern. No free air. There is questionable wall thickening of the sigmoid colon, may reflect enteritis/colitis. 2. Stable mild cardiomegaly.   Electronically Signed   By: Jeb Levering M.D.   On: 11/19/2014 02:16    Oren Binet, MD  [facility] Pager:[phone]  If 7PM-7AM, please contact  night-coverage [email or web address] Password [email or web address] 11/21/2014, 11:28 AM   LOS: 3 days

## 2014-11-21 NOTE — Progress Notes (Signed)
CARE MANAGEMENT NOTE 11/21/2014  Patient:  EARNSTINE, MEINDERS   Account Number:  [id number]  Date Initiated:  11/20/2014  Documentation initiated by:  Elissa Hefty  Subjective/Objective Assessment:   adm w gi bleed     Action/Plan:   lives w fam   Anticipated DC Date:  11/21/2014   Anticipated DC Plan:  [place]  CM consult      [facility] Choice  Resumption Of Svcs/PTA Provider  HOME HEALTH   Choice offered to / List presented to:  C-1 Patient        [place] arranged  HH-1 RN  [place].   Status of service:  Completed, signed off Medicare Important Message given?  NO (If response is "NO", the following Medicare IM given date fields will be blank) Date Medicare IM given:   Medicare IM given by:   Date Additional Medicare IM given:   Additional Medicare IM given by:    Discharge Disposition:  [place]  Per UR Regulation:  Reviewed for med. necessity/level of care/duration of stay  If discussed at [place] of Stay Meetings, dates discussed:    Comments:  11/21/2014 0930 NCM spoke to pt and states she is active with [facility] for [facility]. Pt states she has RW and 3n1 at home. She has two son and dtr at home to assist with her care. Contacted [facility] for scheduled dc home today. Jonnie Finner RN CCM Case Mgmt phone [phone]

## 2014-11-21 NOTE — Consult Note (Signed)
Reason for Consult:Tremor Referring Physician: Ghimire  CC: Tremor  HPI: [name] [name] is an 54 y.o. female with a history of multiple medical problems admitted 1/17 with complaints of abdominal pain, diarrhea and black stools.  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  Reports that last evening she developed a bilateral upper extremity tremor and also notes that she occasionally notices her lower extremities to tremble as well.  This has been persistent since last night.  There has been no associated weakness or numbness.  No change in speech.    Past Medical History  Diagnosis Date  . Hypertension   . Lupus   . CKD (chronic kidney disease)     due to lupus/Dr. [name]  . Diabetes mellitus   . Stenosis of cervical spine region     with HNP at C5/6, C6/7  . Metatarsal bone fracture right    4th  . 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 Arthritis     RA  . Anemia   . Stroke 11/2014    left sided weakness, dysphagia  . Pain in joints   . Paresthesias     Past Surgical History  Procedure Laterality Date  . Breast biopsy      Family History  Problem Relation Age of Onset  . Hypertension    . Lupus    . Rheum arthritis    . Hypertension Mother   . Diabetes Mother   . Hypertension Sister   . Diabetes Father   . Hypertension Maternal Grandmother     Social History:  reports that she has quit smoking. Her smoking use included Cigarettes. She has a 15 pack-year smoking history. She has never used smokeless tobacco. She reports that she does not drink alcohol or use illicit drugs.  Allergies  Allergen Reactions  . Sulfa Antibiotics Itching    Medications:  I have reviewed the patient's current medications. Prior to Admission:  Prescriptions prior to admission  Medication Sig Dispense Refill Last Dose  . acetaminophen (TYLENOL) 325 MG tablet Take 2 tablets (650 mg  total) by mouth every 4 (four) hours as needed for mild pain.   11/18/2014  . ALPRAZolam (XANAX) 0.5 MG tablet Take 1 tablet (0.5 mg total) by mouth 2 (two) times daily as needed for anxiety. 15 tablet 0 11/18/2014  . amLODipine (NORVASC) 10 MG tablet Take 1 tablet (10 mg total) by mouth daily. 30 tablet 0 11/18/2014  . atorvastatin (LIPITOR) 20 MG tablet Take 1 tablet (20 mg total) by mouth daily at 6 PM. 30 tablet 1 11/18/2014  . cloNIDine (CATAPRES) 0.3 MG tablet Take 1 tablet (0.3 mg total) by mouth 3 (three) times daily. 90 tablet 1 11/18/2014  . diclofenac sodium (VOLTAREN) 1 % GEL Apply 2 g topically 4 (four) times daily. To forefoot 4 Tube 0 11/18/2014  . ferrous sulfate 325 (65 FE) MG tablet Take 1 tablet (325 mg total) by mouth daily with breakfast. 30 tablet 1 11/18/2014  . furosemide (LASIX) 80 MG tablet Take 1 tablet (80 mg total) by mouth 2 (two) times daily. 60 tablet 1 11/18/2014  . gabapentin (NEURONTIN) 100 MG capsule Take 1 capsule (100 mg total) by mouth 3 (three) times daily. Start with 1 daily and taper up to three times daily 90 capsule 3 11/18/2014  . hydrALAZINE (APRESOLINE) 25  MG tablet Take 1 tablet (25 mg total) by mouth every 8 (eight) hours. 90 tablet 1 11/18/2014  . hydroxychloroquine (PLAQUENIL) 200 MG tablet Take 2 tablets (400 mg total) by mouth daily. 30 tablet 1 11/18/2014  . isosorbide mononitrate (IMDUR) 60 MG 24 hr tablet Take 1 tablet (60 mg total) by mouth daily. 30 tablet 0 11/18/2014  . labetalol (NORMODYNE) 200 MG tablet Take 2 tablets (400 mg total) by mouth 2 (two) times daily. 120 tablet 0 11/18/2014  . Menthol-Methyl Salicylate (MUSCLE RUB) 10-15 % CREA Apply 1 application topically 3 (three) times daily before meals. 85 g 0 Past Week at Unknown time  . multivitamin (RENA-VIT) TABS tablet Take 1 tablet by mouth daily.   11/18/2014  . mycophenolate (CELLCEPT) 250 MG capsule Take 4 capsules (1,000 mg total) by mouth 2 (two) times daily. 320 capsule 1 11/18/2014  .  nicotine (NICODERM CQ - DOSED IN MG/24 HOURS) 14 mg/24hr patch Place 1 patch (14 mg total) onto the skin daily. 28 patch 0 11/18/2014  . nystatin-triamcinolone ointment (MYCOLOG) Apply 1 application topically 2 (two) times daily. 30 g 0 11/18/2014  . omeprazole (PRILOSEC) 20 MG capsule Take 1 capsule (20 mg total) by mouth daily. 30 capsule 0 11/18/2014  . pantoprazole (PROTONIX) 40 MG tablet Take 1 tablet (40 mg total) by mouth 2 (two) times daily. 60 tablet 1 11/18/2014  . senna-docusate (SENOKOT-S) 8.6-50 MG per tablet Take 1 tablet by mouth 2 (two) times daily. 60 tablet 1 unknown  . sodium bicarbonate 650 MG tablet Take 2 tablets (1,300 mg total) by mouth 2 (two) times daily. 120 tablet 1 11/18/2014  . Alum & Mag Hydroxide-Simeth (MAGIC MOUTHWASH W/LIDOCAINE) SOLN Take 5 mLs by mouth 3 (three) times daily as needed for mouth pain. (Patient not taking: Reported on 11/18/2014) 150 mL 0 Taking  . predniSONE (DELTASONE) 10 MG tablet Take two pills daily for three day. Then decrease to one pill X 3 days. Then decrease to 1/2 pill daily till gone. (Patient not taking: Reported on 11/18/2014) 11 tablet 0 Taking  . Vitamin D, Ergocalciferol, (DRISDOL) 50000 UNITS CAPS capsule Take 1 capsule (50,000 Units total) by mouth every Wednesday. 4 capsule 1 11/15/2014   Scheduled: . amLODipine  10 mg Oral Daily  . atorvastatin  20 mg Oral q1800  . cloNIDine  0.3 mg Oral TID  . diclofenac sodium  2 g Topical QID  . feeding supplement (RESOURCE BREEZE)  1 Container Oral BID BM  . ferrous sulfate  325 mg Oral Q breakfast  . gabapentin  100 mg Oral TID  . hydroxychloroquine  400 mg Oral Daily  . isosorbide mononitrate  60 mg Oral Daily  . labetalol  400 mg Oral BID  . multivitamin  1 tablet Oral Daily  . MUSCLE RUB  1 application Topical TID AC  . mycophenolate  1,000 mg Oral BID  . pantoprazole  40 mg Oral Q0600  . [START ON 11/22/2014] predniSONE  60 mg Oral Q breakfast  . sodium bicarbonate  1,300 mg Oral BID   . sodium chloride  3 mL Intravenous Q12H    ROS: History obtained from the patient  General ROS: negative for - chills, fatigue, fever, night sweats, weight gain or weight loss Psychological ROS: negative for - behavioral disorder, hallucinations, memory difficulties, mood swings or suicidal ideation Ophthalmic ROS: negative for - blurry vision, double vision, eye pain or loss of vision ENT ROS: negative for - epistaxis, nasal discharge, oral lesions, sore  throat, tinnitus or vertigo Allergy and Immunology ROS: negative for - hives or itchy/watery eyes Hematological and Lymphatic ROS: negative for - bleeding problems, bruising or swollen lymph nodes Endocrine ROS: negative for - galactorrhea, hair pattern changes, polydipsia/polyuria or temperature intolerance Respiratory ROS: negative for - cough, hemoptysis, shortness of breath or wheezing Cardiovascular ROS: negative for - chest pain, dyspnea on exertion, edema or irregular heartbeat Gastrointestinal ROS: as noted in HPI Genito-Urinary ROS: negative for - dysuria, hematuria, incontinence or urinary frequency/urgency Musculoskeletal ROS: arthralgias Neurological ROS: as noted in HPI Dermatological ROS: negative for rash and skin lesion changes  Physical Examination: Blood pressure 137/68, pulse 108, temperature 99.6 F (37.6 C), temperature source Oral, resp. rate 24, height 5\' 5"  (1.651 m), weight 59.6 kg (131 lb 6.3 oz), last menstrual period 07/18/2012, SpO2 98 %.  HEENT-  Normocephalic, no lesions, without obvious abnormality.  Normal external eye and conjunctiva.  Normal TM's bilaterally.  Normal auditory canals and external ears. Normal external nose, mucus membranes and septum.  Normal pharynx. Cardiovascular- S1, S2 normal, pulses palpable throughout   Lungs- chest clear, no wheezing, rales, normal symmetric air entry Abdomen- distended but nontender Extremities- no edema Lymph-no adenopathy palpable Musculoskeletal- joint  tenderness with palpation, no deformity or swelling Skin-warm and dry, no hyperpigmentation, vitiligo, or suspicious lesions  Neurological Examination Mental Status: Alert, oriented, thought content appropriate.  Speech fluent without evidence of aphasia.  Able to follow 3 step commands without difficulty. Cranial Nerves: II: Discs flat bilaterally; Visual fields grossly normal, pupils equal, round, reactive to light and accommodation III,IV, VI: ptosis not present, extra-ocular motions intact bilaterally V,VII: smile symmetric, facial light touch sensation normal bilaterally VIII: hearing normal bilaterally IX,X: gag reflex present XI: bilateral shoulder shrug XII: midline tongue extension Motor: Right : Upper extremity   5/5    Left:     Upper extremity   5/5  Lower extremity   5/5     Lower extremity   5/5 Rest tremor noted bilaterally that improves with action.  Occasional jerking of the legs noted.   Sensory: Pinprick and light touch intact throughout, bilaterally Deep Tendon Reflexes: 2+ and symmetric with absent AJ;s bilaterally Plantars: Right: mute   Left: mute Cerebellar: normal finger-to-nose and normal heel-to-shin testing bilaterally   Laboratory Studies:   Basic Metabolic Panel:  Recent Labs Lab 11/18/14 2324 11/19/14 0603 11/20/14 0510 11/21/14 0516  NA 129* 132* 134* 135  K 4.5 4.0 3.7 3.8  CL 97 103 111 108  CO2 18* 16* 18* 16*  GLUCOSE 128* 97 104* 111*  BUN 59* 55* 43* 37*  CREATININE 3.57* 3.24* 2.62* 2.71*  CALCIUM 7.7* 7.3* 8.1* 7.8*    Liver Function Tests:  Recent Labs Lab 11/18/14 2324  AST 18  ALT 8  ALKPHOS 77  BILITOT 0.3  PROT 6.2  ALBUMIN 2.1*    Recent Labs Lab 11/19/14 0302  LIPASE 28   No results for input(s): AMMONIA in the last 168 hours.  CBC:  Recent Labs Lab 11/18/14 2324  11/19/14 1700 11/19/14 2319 11/20/14 0510 11/20/14 1645 11/21/14 0516  WBC 5.6  < > 4.8 4.4 4.0 3.3* 3.7*  NEUTROABS 4.5  --   --    --   --   --   --   HGB 5.4*  < > 7.0* 7.6* 7.3* 7.9* 7.6*  HCT 16.9*  < > 21.0* 22.9* 21.7* 23.6* 22.9*  MCV 83.3  < > 83.0 84.5 83.1 85.5 83.0  PLT 245  < >  196 201 205 216 221  < > = values in this interval not displayed.  Cardiac Enzymes:  Recent Labs Lab 11/19/14 0018 11/19/14 0302  CKTOTAL 36  --   TROPONINI  --  <0.03    BNP: Invalid input(s): POCBNP  CBG:  Recent Labs Lab 11/19/14 0821 11/20/14 0752 11/21/14 0811  GLUCAP 84 95 109*    Microbiology: Results for orders placed or performed during the hospital encounter of 11/18/14  MRSA PCR Screening     Status: None   Collection Time: 11/19/14  5:30 AM  Result Value Ref Range Status   MRSA by PCR NEGATIVE NEGATIVE Final    Comment:        The GeneXpert MRSA Assay (FDA approved for NASAL specimens only), is one component of a comprehensive MRSA colonization surveillance program. It is not intended to diagnose MRSA infection nor to guide or monitor treatment for MRSA infections.   Culture, blood (routine x 2)     Status: None (Preliminary result)   Collection Time: 11/19/14  6:00 AM  Result Value Ref Range Status   Specimen Description BLOOD LEFT ARM  Final   Special Requests BOTTLES DRAWN AEROBIC AND ANAEROBIC 5CC  Final   Culture   Final           BLOOD CULTURE RECEIVED NO GROWTH TO DATE CULTURE WILL BE HELD FOR 5 DAYS BEFORE ISSUING A FINAL NEGATIVE REPORT Performed at [facility]    Report Status PENDING  Incomplete  Culture, blood (routine x 2)     Status: None (Preliminary result)   Collection Time: 11/19/14  6:54 AM  Result Value Ref Range Status   Specimen Description BLOOD RIGHT ARM  Final   Special Requests BOTTLES DRAWN AEROBIC AND ANAEROBIC 10CC  Final   Culture   Final           BLOOD CULTURE RECEIVED NO GROWTH TO DATE CULTURE WILL BE HELD FOR 5 DAYS BEFORE ISSUING A FINAL NEGATIVE REPORT Performed at [facility]    Report Status PENDING  Incomplete    Coagulation  Studies:  Recent Labs  11/19/14 0603  LABPROT 15.1  INR 1.18    Urinalysis:  Recent Labs Lab 11/19/14 0108 11/21/14 1350  COLORURINE YELLOW YELLOW  LABSPEC 1.015 1.014  PHURINE 5.5 5.5  GLUCOSEU NEGATIVE 100*  HGBUR NEGATIVE NEGATIVE  BILIRUBINUR NEGATIVE NEGATIVE  KETONESUR NEGATIVE NEGATIVE  PROTEINUR 100* >300*  UROBILINOGEN 0.2 0.2  NITRITE NEGATIVE NEGATIVE  LEUKOCYTESUR NEGATIVE TRACE*    Lipid Panel:     Component Value Date/Time   CHOL 78 10/12/2014 1246   TRIG 92 10/12/2014 1246   HDL 12* 10/12/2014 1246   CHOLHDL 6.5 10/12/2014 1246   VLDL 18 10/12/2014 1246   LDLCALC 48 10/12/2014 1246    HgbA1C:  Lab Results  Component Value Date   HGBA1C 5.2 10/12/2014    Urine Drug Screen:     Component Value Date/Time   LABOPIA NONE DETECTED 09/28/2014 2239   COCAINSCRNUR NONE DETECTED 09/28/2014 2239   LABBENZ NONE DETECTED 09/28/2014 2239   AMPHETMU NONE DETECTED 09/28/2014 2239   THCU NONE DETECTED 09/28/2014 2239   LABBARB NONE DETECTED 09/28/2014 2239    Alcohol Level: No results for input(s): ETH in the last 168 hours.  Other results: EKG: sinus tachycardia at 118 bpm.  Imaging: Ct Abdomen Pelvis Wo Contrast  11/19/2014   CLINICAL DATA:  54 y.o. female with past medical history of hypertension, lupus (on plaquenil and CellCept),  chronic kidney disease-IV, recent history of intracranial hemorrhage, memberanous glomerulonephritis, GERD, history of adrenal insufficiency, who presents with abdominal pain, diarrhea and black stool. Initial encounter.  EXAM: CT ABDOMEN AND PELVIS WITHOUT CONTRAST  TECHNIQUE: Multidetector CT imaging of the abdomen and pelvis was performed following the standard protocol without IV contrast.  COMPARISON:  CT, 09/12/2009.  FINDINGS: Small effusions. The heart mildly enlarged. The linear/reticular lung base opacity is noted dependently consistent with subsegmental atelectasis. No convincing pulmonary edema.  Liver, spleen,  gallbladder, pancreas: Unremarkable. No bile duct dilation. No adrenal masses.  2.3 cm low-density left midpole renal mass consistent with a cyst, enlarged from the prior exam. No other renal masses, no stones and no hydronephrosis. Ureters normal course and in caliber. Bladder is unremarkable.  No pathologically enlarged lymph nodes.  Small amount of reach fluid in the posterior pelvic recess.  No colonic wall thickening or mesenteric inflammation. No evidence of obstruction. No bowel dilation. Normal small bowel. Normal appendix.  No osteoblastic or osteolytic lesions.  IMPRESSION: 1. No acute findings in the abdomen pelvis. 2. No bowel inflammatory change. No findings to explain patient's abdominal pain or diarrhea or black stools. 3. Small amount of free fluid in the pelvis, nonspecific in this patient with a history chronic kidney disease. Small pleural effusions. Convincing pulmonary edema.   Electronically Signed   By: Lajean Manes M.D.   On: 11/19/2014 21:59     Assessment/Plan: 54 year old female with multiple medical problems and development of tremor since last night.  Exam reveals a rest tremor that is bilateral.  Likely metabolic in etiology.  Patient has been on Neurontin with impaired renal function and has been increasing dose.  Has also had some GI bleeding that may have led to an increased ammonia.  Further work up recommended to rule out other possible causes.   Recommendations: 1.  TSH, B12, ammonia 2.  Unclear use for Neurontin.  Would discontinue if possible.      Alexis Goodell, MD [facility] [phone] 11/21/2014, 5:50 PM

## 2014-11-21 NOTE — Progress Notes (Signed)
Daily Rounding Note  11/21/2014, 10:28 AM  LOS: 3 days   SUBJECTIVE:       Last BM was on 1/16.  Abdominal cramping resolved.  Tolerating solids. Chronic ankle pain.   OBJECTIVE:         Vital signs in last 24 hours:    Temp:  [97.1 F (36.2 C)-99.4 F (37.4 C)] 99.4 F (37.4 C) (01/19 0554) Pulse Rate:  [83-95] 84 (01/18 1829) Resp:  [14-23] 17 (01/19 0554) BP: (113-183)/(57-97) 141/97 mmHg (01/19 0926) SpO2:  [99 %-100 %] 99 % (01/19 0554) Weight:  [131 lb 6.3 oz (59.6 kg)] 131 lb 6.3 oz (59.6 kg) (01/19 0554) Last BM Date: 11/18/14 Filed Weights   11/19/14 0700 11/20/14 0420 11/21/14 0554  Weight: 128 lb (58.06 kg) 132 lb 4.4 oz (60 kg) 131 lb 6.3 oz (59.6 kg)   General: somewhat frail and ill looking.  Comfortable and pleasant.    Heart: RRR with early 1 to 2/6 systolic murmer.   Chest: reduced BS bil but clear.  No dyspnea Abdomen: soft, NT, ND, active BS  Extremities: no CCE Neuro/Psych:  Oriented x 3.  Alert.  Relaxed.     Intake/Output from previous day: 01/18 0701 - 01/19 0700 In: 1667.6 [P.O.:540; I.V.:427.6; IV Piggyback:700] Out: -   Intake/Output this shift: Total I/O In: 360 [P.O.:360] Out: -   Lab Results:  Recent Labs  11/20/14 0510 11/20/14 1645 11/21/14 0516  WBC 4.0 3.3* 3.7*  HGB 7.3* 7.9* 7.6*  HCT 21.7* 23.6* 22.9*  PLT 205 216 221   BMET  Recent Labs  11/19/14 0603 11/20/14 0510 11/21/14 0516  NA 132* 134* 135  K 4.0 3.7 3.8  CL 103 111 108  CO2 16* 18* 16*  GLUCOSE 97 104* 111*  BUN 55* 43* 37*  CREATININE 3.24* 2.62* 2.71*  CALCIUM 7.3* 8.1* 7.8*   LFT  Recent Labs  11/18/14 2324  PROT 6.2  ALBUMIN 2.1*  AST 18  ALT 8  ALKPHOS 77  BILITOT 0.3   PT/INR  Recent Labs  11/19/14 0603  LABPROT 15.1  INR 1.18   Hepatitis Panel No results for input(s): HEPBSAG, HCVAB, HEPAIGM, HEPBIGM in the last 72 hours.  Studies/Results: Ct Abdomen Pelvis Wo  Contrast  11/19/2014   CLINICAL DATA:  54 y.o. female with past medical history of hypertension, lupus (on plaquenil and CellCept), chronic kidney disease-IV, recent history of intracranial hemorrhage, memberanous glomerulonephritis, GERD, history of adrenal insufficiency, who presents with abdominal pain, diarrhea and black stool. Initial encounter.  EXAM: CT ABDOMEN AND PELVIS WITHOUT CONTRAST  TECHNIQUE: Multidetector CT imaging of the abdomen and pelvis was performed following the standard protocol without IV contrast.  COMPARISON:  CT, 09/12/2009.  FINDINGS: Small effusions. The heart mildly enlarged. The linear/reticular lung base opacity is noted dependently consistent with subsegmental atelectasis. No convincing pulmonary edema.  Liver, spleen, gallbladder, pancreas: Unremarkable. No bile duct dilation. No adrenal masses.  2.3 cm low-density left midpole renal mass consistent with a cyst, enlarged from the prior exam. No other renal masses, no stones and no hydronephrosis. Ureters normal course and in caliber. Bladder is unremarkable.  No pathologically enlarged lymph nodes.  Small amount of reach fluid in the posterior pelvic recess.  No colonic wall thickening or mesenteric inflammation. No evidence of obstruction. No bowel dilation. Normal small bowel. Normal appendix.  No osteoblastic or osteolytic lesions.  IMPRESSION: 1. No acute findings in the abdomen pelvis. 2.  No bowel inflammatory change. No findings to explain patient's abdominal pain or diarrhea or black stools. 3. Small amount of free fluid in the pelvis, nonspecific in this patient with a history chronic kidney disease. Small pleural effusions. Convincing pulmonary edema.   Electronically Signed   By: Lajean Manes M.D.   On: 11/19/2014 21:59   Scheduled Meds: . amLODipine  10 mg Oral Daily  . atorvastatin  20 mg Oral q1800  . ciprofloxacin  400 mg Intravenous Q24H  . cloNIDine  0.3 mg Oral TID  . diclofenac sodium  2 g Topical QID  .  feeding supplement (RESOURCE BREEZE)  1 Container Oral BID BM  . ferrous sulfate  325 mg Oral Q breakfast  . gabapentin  100 mg Oral TID  . hydrALAZINE  50 mg Oral 3 times per day  . hydroxychloroquine  400 mg Oral Daily  . isosorbide mononitrate  60 mg Oral Daily  . labetalol  400 mg Oral BID  . metronidazole  500 mg Intravenous Q8H  . multivitamin  1 tablet Oral Daily  . MUSCLE RUB  1 application Topical TID AC  . mycophenolate  1,000 mg Oral BID  . pantoprazole (PROTONIX) IV  40 mg Intravenous Q12H  . sodium bicarbonate  1,300 mg Oral BID  . sodium chloride  3 mL Intravenous Q12H   Continuous Infusions: . sodium chloride 10 mL/hr (11/20/14 0839)   PRN Meds:.acetaminophen, ALPRAZolam, hydrALAZINE, morphine injection, ondansetron **OR** ondansetron (ZOFRAN) IV  ASSESMENT:   *  Diarrhea, lower abdominal pain:  All resolved.   FOBT +. Though not CT confirmed, suspect ischemic colitis.  2007 colonoscopy with mixed rrhoids and HP colon polyp.  No signs of villous atrophy on EGD with duodenal bx.   *  Acute on chronic anemia. On chronic  Po iron at home. S/p 2 PRBCs, Hgb 5 to 7.3 post transfusion.   *  CKD.   *  RA.  On Plaquenil.    PLAN   *  Stop abx. Stop enteric precautions.  No need to check stool pathogens.  Stop IV PPI. Switch to oral PPI.      Azucena Freed  11/21/2014, 10:28 AM Pager: [phone]  GI ATTENDING  Interval history and data reviewed. Patient seen and examined. Agree with progress note as outlined above. No abdominal complaints or bleeding. No bowel movements. Anemia stable without change. Previous endoscopic procedures retrieved and reviewed. Colonoscopy and upper endoscopy performed June 2007 with Dr. Ardis Hughs. Upper endoscopy was completely normal. Colonoscopy was normal except for tiny benign hyperplastic colon polyp and hemorrhoids. She was being evaluated at that time for anemia. Recommend discontinuing antibiotics. Diet as tolerated. GI available if  needed. Will sign off  John N. Geri [place]., M.D. [facility] Division of Gastroenterology

## 2014-11-22 ENCOUNTER — Inpatient Hospital Stay (HOSPITAL_COMMUNITY): Payer: Managed Care, Other (non HMO)

## 2014-11-22 DIAGNOSIS — K922 Gastrointestinal hemorrhage, unspecified: Secondary | ICD-10-CM | POA: Insufficient documentation

## 2014-11-22 DIAGNOSIS — R509 Fever, unspecified: Secondary | ICD-10-CM

## 2014-11-22 LAB — CBC
HEMATOCRIT: 23.1 % — AB (ref 36.0–46.0)
Hemoglobin: 7.6 g/dL — ABNORMAL LOW (ref 12.0–15.0)
MCH: 27.5 pg (ref 26.0–34.0)
MCHC: 32.9 g/dL (ref 30.0–36.0)
MCV: 83.7 fL (ref 78.0–100.0)
Platelets: 231 10*3/uL (ref 150–400)
RBC: 2.76 MIL/uL — ABNORMAL LOW (ref 3.87–5.11)
RDW: 14.6 % (ref 11.5–15.5)
WBC: 8.4 10*3/uL (ref 4.0–10.5)

## 2014-11-22 LAB — BASIC METABOLIC PANEL
ANION GAP: 12 (ref 5–15)
BUN: 34 mg/dL — ABNORMAL HIGH (ref 6–23)
CO2: 17 mmol/L — AB (ref 19–32)
Calcium: 7.8 mg/dL — ABNORMAL LOW (ref 8.4–10.5)
Chloride: 105 mEq/L (ref 96–112)
Creatinine, Ser: 2.95 mg/dL — ABNORMAL HIGH (ref 0.50–1.10)
GFR calc Af Amer: 20 mL/min — ABNORMAL LOW (ref >90)
GFR, EST NON AFRICAN AMERICAN: 17 mL/min — AB (ref >90)
GLUCOSE: 105 mg/dL — AB (ref 70–99)
Potassium: 3.7 mmol/L (ref 3.5–5.1)
SODIUM: 134 mmol/L — AB (ref 135–145)

## 2014-11-22 LAB — MAGNESIUM: Magnesium: 1 mg/dL — ABNORMAL LOW (ref 1.5–2.5)

## 2014-11-22 LAB — GLUCOSE, CAPILLARY
Glucose-Capillary: 118 mg/dL — ABNORMAL HIGH (ref 70–99)
Glucose-Capillary: 362 mg/dL — ABNORMAL HIGH (ref 70–99)

## 2014-11-22 LAB — URIC ACID: Uric Acid, Serum: 7 mg/dL (ref 2.4–7.0)

## 2014-11-22 LAB — C-REACTIVE PROTEIN: CRP: 12.9 mg/dL — ABNORMAL HIGH (ref <0.60)

## 2014-11-22 LAB — VITAMIN B12: VITAMIN B 12: 1425 pg/mL — AB (ref 211–911)

## 2014-11-22 LAB — SEDIMENTATION RATE: Sed Rate: 124 mm/hr — ABNORMAL HIGH (ref 0–22)

## 2014-11-22 MED ORDER — DOCUSATE SODIUM 100 MG PO CAPS
200.0000 mg | ORAL_CAPSULE | Freq: Once | ORAL | Status: AC
  Administered 2014-11-22: 200 mg via ORAL
  Filled 2014-11-22: qty 2

## 2014-11-22 MED ORDER — INSULIN ASPART 100 UNIT/ML ~~LOC~~ SOLN
4.0000 [IU] | Freq: Once | SUBCUTANEOUS | Status: AC
  Administered 2014-11-22: 4 [IU] via SUBCUTANEOUS

## 2014-11-22 NOTE — Progress Notes (Signed)
Review of pt's tele strips shows multiple PVCs on tele (some in pairs) since noon today. These did not alarm the RN's phone for some reason. Night shift MD notified.

## 2014-11-22 NOTE — Progress Notes (Addendum)
Subjective: Tremor continues  Objective: Current vital signs: BP 125/63 mmHg  Pulse 98  Temp(Src) 97.9 F (36.6 C) (Oral)  Resp 1  Ht 5\' 5"  (1.651 m)  Wt 58.6 kg (129 lb 3 oz)  BMI 21.50 kg/m2  SpO2 100%  LMP 07/18/2012 Vital signs in last 24 hours: Temp:  [97.9 F (36.6 C)-101.4 F (38.6 C)] 97.9 F (36.6 C) (01/20 1445) Pulse Rate:  [87-116] 98 (01/20 1445) Resp:  [1-18] 1 (01/20 1445) BP: (113-149)/(58-73) 125/63 mmHg (01/20 1553) SpO2:  [98 %-100 %] 100 % (01/20 1445) Weight:  [58.6 kg (129 lb 3 oz)] 58.6 kg (129 lb 3 oz) (01/20 0538)  Intake/Output from previous day: 01/19 0701 - 01/20 0700 In: 840 [P.O.:840] Out: 700 [Urine:700] Intake/Output this shift:   Nutritional status: Diet Heart  Neurologic Exam: Mental Status: Alert, oriented, thought content appropriate. Speech fluent without evidence of aphasia. Able to follow 3 step commands without difficulty. Cranial Nerves: II: Discs flat bilaterally; Visual fields grossly normal, pupils equal, round, reactive to light and accommodation III,IV, VI: ptosis not present, extra-ocular motions intact bilaterally V,VII: smile symmetric, facial light touch sensation normal bilaterally VIII: hearing normal bilaterally IX,X: gag reflex present XI: bilateral shoulder shrug XII: midline tongue extension Motor: Right :Upper extremity 5/5Left: Upper extremity 5/5 Lower extremity 5/5Lower extremity 5/5 Rest tremor noted bilaterally that improves with action. Occasional jerking of the legs noted.  Sensory: Pinprick and light touch intact throughout, bilaterally Deep Tendon Reflexes: 2+ and symmetric with absent AJ's bilaterally Plantars: Right: muteLeft: mute Cerebellar: normal finger-to-nose and normal heel-to-shin testing bilaterally  Lab Results: Basic Metabolic  Panel:  Recent Labs Lab 11/18/14 2324 11/19/14 0603 11/20/14 0510 11/21/14 0516 11/22/14 0530  NA 129* 132* 134* 135 134*  K 4.5 4.0 3.7 3.8 3.7  CL 97 103 111 108 105  CO2 18* 16* 18* 16* 17*  GLUCOSE 128* 97 104* 111* 105*  BUN 59* 55* 43* 37* 34*  CREATININE 3.57* 3.24* 2.62* 2.71* 2.95*  CALCIUM 7.7* 7.3* 8.1* 7.8* 7.8*    Liver Function Tests:  Recent Labs Lab 11/18/14 2324  AST 18  ALT 8  ALKPHOS 77  BILITOT 0.3  PROT 6.2  ALBUMIN 2.1*    Recent Labs Lab 11/19/14 0302  LIPASE 28    Recent Labs Lab 11/21/14 2108  AMMONIA 33*    CBC:  Recent Labs Lab 11/18/14 2324  11/19/14 2319 11/20/14 0510 11/20/14 1645 11/21/14 0516 11/22/14 0530  WBC 5.6  < > 4.4 4.0 3.3* 3.7* 8.4  NEUTROABS 4.5  --   --   --   --   --   --   HGB 5.4*  < > 7.6* 7.3* 7.9* 7.6* 7.6*  HCT 16.9*  < > 22.9* 21.7* 23.6* 22.9* 23.1*  MCV 83.3  < > 84.5 83.1 85.5 83.0 83.7  PLT 245  < > 201 205 216 221 231  < > = values in this interval not displayed.  Cardiac Enzymes:  Recent Labs Lab 11/19/14 0018 11/19/14 0302  CKTOTAL 36  --   TROPONINI  --  <0.03    Lipid Panel: No results for input(s): CHOL, TRIG, HDL, CHOLHDL, VLDL, LDLCALC in the last 168 hours.  CBG:  Recent Labs Lab 11/19/14 0821 11/20/14 0752 11/21/14 0811 11/22/14 0806  GLUCAP 84 95 109* 118*    Microbiology: Results for orders placed or performed during the hospital encounter of 11/18/14  MRSA PCR Screening     Status: None   Collection  Time: 11/19/14  5:30 AM  Result Value Ref Range Status   MRSA by PCR NEGATIVE NEGATIVE Final    Comment:        The GeneXpert MRSA Assay (FDA approved for NASAL specimens only), is one component of a comprehensive MRSA colonization surveillance program. It is not intended to diagnose MRSA infection nor to guide or monitor treatment for MRSA infections.   Culture, blood (routine x 2)     Status: None (Preliminary result)   Collection Time: 11/19/14   6:00 AM  Result Value Ref Range Status   Specimen Description BLOOD LEFT ARM  Final   Special Requests BOTTLES DRAWN AEROBIC AND ANAEROBIC 5CC  Final   Culture   Final           BLOOD CULTURE RECEIVED NO GROWTH TO DATE CULTURE WILL BE HELD FOR 5 DAYS BEFORE ISSUING A FINAL NEGATIVE REPORT Performed at [facility]    Report Status PENDING  Incomplete  Culture, blood (routine x 2)     Status: None (Preliminary result)   Collection Time: 11/19/14  6:54 AM  Result Value Ref Range Status   Specimen Description BLOOD RIGHT ARM  Final   Special Requests BOTTLES DRAWN AEROBIC AND ANAEROBIC 10CC  Final   Culture   Final           BLOOD CULTURE RECEIVED NO GROWTH TO DATE CULTURE WILL BE HELD FOR 5 DAYS BEFORE ISSUING A FINAL NEGATIVE REPORT Performed at [facility]    Report Status PENDING  Incomplete  Culture, blood (routine x 2)     Status: None (Preliminary result)   Collection Time: 11/21/14  2:05 PM  Result Value Ref Range Status   Specimen Description BLOOD RIGHT ANTECUBITAL  Final   Special Requests BOTTLES DRAWN AEROBIC AND ANAEROBIC 10CC  Final   Culture   Final           BLOOD CULTURE RECEIVED NO GROWTH TO DATE CULTURE WILL BE HELD FOR 5 DAYS BEFORE ISSUING A FINAL NEGATIVE REPORT Performed at [facility]    Report Status PENDING  Incomplete  Culture, blood (routine x 2)     Status: None (Preliminary result)   Collection Time: 11/21/14  2:20 PM  Result Value Ref Range Status   Specimen Description BLOOD RIGHT FOREARM  Final   Special Requests BOTTLES DRAWN AEROBIC AND ANAEROBIC 10CC  Final   Culture   Final           BLOOD CULTURE RECEIVED NO GROWTH TO DATE CULTURE WILL BE HELD FOR 5 DAYS BEFORE ISSUING A FINAL NEGATIVE REPORT Performed at [facility]    Report Status PENDING  Incomplete    Coagulation Studies: No results for input(s): LABPROT, INR in the last 72 hours.  Imaging: Dg Chest 2 View  11/22/2014   CLINICAL DATA:  Initial  evaluation for fever  EXAM: CHEST  2 VIEW  COMPARISON:  11/06/2014  FINDINGS: Stable mild cardiac enlargement. Vascular pattern is normal. Mild patchy bibasilar infiltrates superior this is new from the prior study. Minimal blunting right costophrenic angle could indicate a tiny effusion.  IMPRESSION: New bibasilar patchy infiltrates concerning for possible pneumonia. Tiny right pleural effusion possible.   Electronically Signed   By: Skipper Cliche M.D.   On: 11/22/2014 08:28    Medications:  I have reviewed the patient's current medications. Scheduled: . amLODipine  10 mg Oral Daily  . atorvastatin  20 mg Oral q1800  . cloNIDine  0.3 mg Oral TID  . diclofenac sodium  2 g Topical QID  . feeding supplement (RESOURCE BREEZE)  1 Container Oral BID BM  . ferrous sulfate  325 mg Oral Q breakfast  . gabapentin  100 mg Oral TID  . hydroxychloroquine  400 mg Oral Daily  . isosorbide mononitrate  60 mg Oral Daily  . labetalol  400 mg Oral BID  . multivitamin  1 tablet Oral Daily  . MUSCLE RUB  1 application Topical TID AC  . mycophenolate  1,000 mg Oral BID  . pantoprazole  40 mg Oral Q0600  . predniSONE  60 mg Oral Q breakfast  . sodium bicarbonate  1,300 mg Oral BID  . sodium chloride  3 mL Intravenous Q12H    Assessment/Plan: Tremor continues.  Likely multifactorial and related to elevated ammonia, decreased renal function and use of Neurontin.  Ammonia 33.  TSH and B12 unremarkable.    Recommendations: 1. Agree with addressing above factors as reasonable   LOS: 4 days   Alexis Goodell, MD [facility] [phone] 11/22/2014  7:37 PM

## 2014-11-22 NOTE — Progress Notes (Signed)
Physical Therapy Treatment Patient Details Name: [name] [name] MRN: [id number] DOB: Mar 22, 1961 Today's Date: 11/22/2014    History of Present Illness 54 y.o. female admitted to [facility] on 11/18/14 for abdominal pain, diarrhea, and black stool.  GI workup in progress.  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      PT Comments    Progressing steadily.  Emphasis on practice/improving heel/toe pattern of gait.  Follow Up Recommendations  Home health PT     Equipment Recommendations  None recommended by PT    Recommendations for Other Services       Precautions / Restrictions Precautions Precautions: Fall Precaution Comments: h/o ICH with Lt sided weakness     Mobility  Bed Mobility Overal bed mobility: Modified Independent                Transfers Overall transfer level: Needs assistance Equipment used: Rolling walker (2 wheeled) Transfers: Sit to/from Stand Sit to Stand: Supervision         General transfer comment: cues for max safety  Ambulation/Gait Ambulation/Gait assistance: Min guard Ambulation Distance (Feet): 220 Feet Assistive device: Rolling walker (2 wheeled) Gait Pattern/deviations: Step-through pattern Gait velocity: decreased   General Gait Details: worked on improving heel/toe gait with a little more knee flexion.  Knee snap into extension lessened with better heel/toe pattern.   Stairs            Wheelchair Mobility    Modified Rankin (Stroke Patients Only)       Balance Overall balance assessment: Needs assistance Sitting-balance support: No upper extremity supported Sitting  balance-Leahy Scale: Good     Standing balance support: No upper extremity supported Standing balance-Leahy Scale: Fair Standing balance comment: based on her doing her on pericare                    Cognition Arousal/Alertness: Awake/alert Behavior During Therapy: WFL for tasks assessed/performed;Flat affect Overall Cognitive Status: Within Functional Limits for tasks assessed                      Exercises      General Comments General comments (skin integrity, edema, etc.): sats with some exertion at 97% and EHR 99 bpm      Pertinent Vitals/Pain Pain Assessment: No/denies pain    Home Living                      Prior Function            PT Goals (current goals can now be found in the care plan section) Acute Rehab PT Goals Patient Stated Goal: to return home  PT Goal Formulation: With patient Time For Goal Achievement: 12/04/14 Potential to Achieve Goals: Good Progress towards PT goals: Progressing toward goals    Frequency  Min 3X/week    PT Plan Current plan remains appropriate    Co-evaluation             End of Session   Activity Tolerance: Patient tolerated treatment well Patient left: in bed;with call bell/phone within reach     Time: 1722-1739 PT  Time Calculation (min) (ACUTE ONLY): 17 min  Charges:  $Gait Training: 8-22 mins                    G Codes:      Amiayah Giebel, Eliseo Gum 11/22/2014, 5:46 PM 11/22/2014  Loretto Bing, PT [phone]  (pager)

## 2014-11-22 NOTE — Progress Notes (Signed)
PROGRESS NOTE  [name] [name] PPI:[id number] DOB: 03/15/1961 DOA: 11/18/2014 PCP: No primary care provider on file.  Assessment/Plan: Lower GIB (gastrointestinal bleeding): -suspected secondary to Ischemic Colitis although CT of the abdomen did not show any evidence of colitis.  -Transfused 2 units of PRBC, posttransfusion hemoglobin remained stable. Gastroenterology following, no plans for endoscopic procedures at this time.  Fever -source unclear presently -Urinalysis negative for pyuria -11/21/2014 -Blood cultures 2 sets--pending -Obtain chest x-ray -Check uric acid -C3, C4, CH 50, dsDNA, ESR, CRP--to clarify if the patient is having a lupus flareup which may be contributing to her fever -hold abx for now as pt is clinically stable   Suspected ischemic Colitis:  -Given history of being on immunosuppressive, and loose stools, was empirically started on Cipro/Flagyl. CT of the abdomen did not show any major abnormalities. No further diarrhea-in fact no further bowel movement since admission. Will discontinue Cipro/Flagyl today, monitor for another 24 hours, if hemoglobin continues to remain stable, suspect can be discharged on 11/22/14.   Acute Blood Loss Anemia:has baseline chronic anemia secondary to CKD(8.6 on 11/06/14)., acute anemia is secondary to blood loss from colitis. Transfused 2 units of PRBC on admission, hemoglobin currently up to 7.6.   Hyponatremia: -Improved. Suspect this was secondary to dehydration.   Metabolic Acidosis:secondary to CKD, continue Bicarbonate   CKD stage 4: -creatinine close to usual baseline. 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 Continue Cellcept.   Hx of SLE/RA:continue Cellcept and Plaquenil.   HTN:  -moderate control, continue Amlodipine,Clonidine,Imdur, Labetalol  -Hydralazine was discontinued due to concerns of it causing arthralgias  Recent hx of AYT:KZSW residual  mild left sided deficits.  Tremor -Ammonia, serum B12, TSH normal -May be due to the patient's gabapentin--patient unwilling to discontinue medication at this time, but is willing to decrease dose   Severe protein calorie malnutrition -Continue nutritional supplement     Family Communication:   Pt at beside Disposition Plan:   Home when medically stable        Procedures/Studies: Ct Abdomen Pelvis Wo Contrast  11/19/2014   CLINICAL DATA:  54 y.o. female with past medical history of hypertension, lupus (on plaquenil and CellCept), chronic kidney disease-IV, recent history of intracranial hemorrhage, memberanous glomerulonephritis, GERD, history of adrenal insufficiency, who presents with abdominal pain, diarrhea and black stool. Initial encounter.  EXAM: CT ABDOMEN AND PELVIS WITHOUT CONTRAST  TECHNIQUE: Multidetector CT imaging of the abdomen and pelvis was performed following the standard protocol without IV contrast.  COMPARISON:  CT, 09/12/2009.  FINDINGS: Small effusions. The heart mildly enlarged. The linear/reticular lung base opacity is noted dependently consistent with subsegmental atelectasis. No convincing pulmonary edema.  Liver, spleen, gallbladder, pancreas: Unremarkable. No bile duct dilation. No adrenal masses.  2.3 cm low-density left midpole renal mass consistent with a cyst, enlarged from the prior exam. No other renal masses, no stones and no hydronephrosis. Ureters normal course and in caliber. Bladder is unremarkable.  No pathologically enlarged lymph nodes.  Small amount of reach fluid in the posterior pelvic recess.  No colonic wall thickening or mesenteric inflammation. No evidence of obstruction. No bowel dilation. Normal small bowel. Normal appendix.  No osteoblastic or osteolytic lesions.  IMPRESSION: 1. No acute findings in the abdomen pelvis. 2. No bowel inflammatory change. No findings to explain patient's abdominal pain or diarrhea or black stools. 3. Small  amount of free fluid in the pelvis, nonspecific in this  patient with a history chronic kidney disease. Small pleural effusions. Convincing pulmonary edema.   Electronically Signed   By: Amie Portland M.D.   On: 11/19/2014 21:59   Dg Chest 2 View  11/06/2014   CLINICAL DATA:  Initial evaluation for dysphagia and chest pain, shortness of breath, personal history of lupus and stroke, patient smokes  EXAM: CHEST  2 VIEW  COMPARISON:  10/10/2014  FINDINGS: Mild cardiac enlargement. Vascular pattern is normal. No consolidation or effusion. No change from prior study.  IMPRESSION: Stable cardiac enlargement with no acute findings   Electronically Signed   By: Esperanza Heir M.D.   On: 11/06/2014 14:29   Dg Abd Acute W/chest  11/19/2014   CLINICAL DATA:  Diarrhea for 2 weeks.  EXAM: ACUTE ABDOMEN SERIES (ABDOMEN 2 VIEW & CHEST 1 VIEW)  COMPARISON:  Chest radiograph 11/06/2014  FINDINGS: Mild cardiomegaly is stable. The lungs are clear. There is no free intra-abdominal air. No dilated bowel loops to suggest obstruction. There is questionable colonic wall thickening involving the rectosigmoid colon. No radiopaque calculi. No acute osseous abnormalities are seen.  IMPRESSION: 1. Nonobstructive bowel gas pattern. No free air. There is questionable wall thickening of the sigmoid colon, may reflect enteritis/colitis. 2. Stable mild cardiomegaly.   Electronically Signed   By: Rubye Oaks M.D.   On: 11/19/2014 02:16         Subjective: Patient states the ankle pain is better than yesterday, but she has some residual left ankle pain. She denies any chest pain, shortness breath, coughing, hemoptysis, vomiting, diarrhea, dysuria, hematuria, hematochezia, melena.  She denies any headache or visual disturbance  Objective: Filed Vitals:   11/21/14 1535 11/21/14 2043 11/21/14 2126 11/22/14 0538  BP: 137/68 147/58 149/66 143/67  Pulse:  116 98 87  Temp: 99.6 F (37.6 C) 100.1 F (37.8 C) 101.4 F (38.6 C) 97.9  F (36.6 C)  TempSrc: Oral Oral Oral Oral  Resp:  18 18 18   Height:      Weight:    58.6 kg (129 lb 3 oz)  SpO2:  100% 98% 99%    Intake/Output Summary (Last 24 hours) at 11/22/14 0754 Last data filed at 11/22/14 0426  Gross per 24 hour  Intake    840 ml  Output    700 ml  Net    140 ml   Weight change: -1 kg (-2 lb 3.3 oz) Exam:   General:  Pt is alert, follows commands appropriately, not in acute distress  HEENT: No icterus, No thrush, No neck mass, no meningismus, [place]/AT  Cardiovascular: RRR, S1/S2, no rubs, no gallops  Respiratory: Bibasilar crackles, R>L  Abdomen: Soft/+BS, non tender, non distended, no guarding  Extremities: No edema, No lymphangitis, No petechiae, No rashes, no synovitis  Data Reviewed: Basic Metabolic Panel:  Recent Labs Lab 11/18/14 2324 11/19/14 0603 11/20/14 0510 11/21/14 0516 11/22/14 0530  NA 129* 132* 134* 135 134*  K 4.5 4.0 3.7 3.8 3.7  CL 97 103 111 108 105  CO2 18* 16* 18* 16* 17*  GLUCOSE 128* 97 104* 111* 105*  BUN 59* 55* 43* 37* 34*  CREATININE 3.57* 3.24* 2.62* 2.71* 2.95*  CALCIUM 7.7* 7.3* 8.1* 7.8* 7.8*   Liver Function Tests:  Recent Labs Lab 11/18/14 2324  AST 18  ALT 8  ALKPHOS 77  BILITOT 0.3  PROT 6.2  ALBUMIN 2.1*    Recent Labs Lab 11/19/14 0302  LIPASE 28    Recent Labs Lab 11/21/14 2108  AMMONIA 33*   CBC:  Recent Labs Lab 11/18/14 2324  11/19/14 2319 11/20/14 0510 11/20/14 1645 11/21/14 0516 11/22/14 0530  WBC 5.6  < > 4.4 4.0 3.3* 3.7* 8.4  NEUTROABS 4.5  --   --   --   --   --   --   HGB 5.4*  < > 7.6* 7.3* 7.9* 7.6* 7.6*  HCT 16.9*  < > 22.9* 21.7* 23.6* 22.9* 23.1*  MCV 83.3  < > 84.5 83.1 85.5 83.0 83.7  PLT 245  < > 201 205 216 221 231  < > = values in this interval not displayed. Cardiac Enzymes:  Recent Labs Lab 11/19/14 0018 11/19/14 0302  CKTOTAL 36  --   TROPONINI  --  <0.03   BNP: Invalid input(s): POCBNP CBG:  Recent Labs Lab 11/19/14 0821  11/20/14 0752 11/21/14 0811  GLUCAP 84 95 109*    Recent Results (from the past 240 hour(s))  MRSA PCR Screening     Status: None   Collection Time: 11/19/14  5:30 AM  Result Value Ref Range Status   MRSA by PCR NEGATIVE NEGATIVE Final    Comment:        The GeneXpert MRSA Assay (FDA approved for NASAL specimens only), is one component of a comprehensive MRSA colonization surveillance program. It is not intended to diagnose MRSA infection nor to guide or monitor treatment for MRSA infections.   Culture, blood (routine x 2)     Status: None (Preliminary result)   Collection Time: 11/19/14  6:00 AM  Result Value Ref Range Status   Specimen Description BLOOD LEFT ARM  Final   Special Requests BOTTLES DRAWN AEROBIC AND ANAEROBIC 5CC  Final   Culture   Final           BLOOD CULTURE RECEIVED NO GROWTH TO DATE CULTURE WILL BE HELD FOR 5 DAYS BEFORE ISSUING A FINAL NEGATIVE REPORT Performed at [facility]    Report Status PENDING  Incomplete  Culture, blood (routine x 2)     Status: None (Preliminary result)   Collection Time: 11/19/14  6:54 AM  Result Value Ref Range Status   Specimen Description BLOOD RIGHT ARM  Final   Special Requests BOTTLES DRAWN AEROBIC AND ANAEROBIC 10CC  Final   Culture   Final           BLOOD CULTURE RECEIVED NO GROWTH TO DATE CULTURE WILL BE HELD FOR 5 DAYS BEFORE ISSUING A FINAL NEGATIVE REPORT Performed at [facility]    Report Status PENDING  Incomplete  Culture, blood (routine x 2)     Status: None (Preliminary result)   Collection Time: 11/21/14  2:05 PM  Result Value Ref Range Status   Specimen Description BLOOD RIGHT ANTECUBITAL  Final   Special Requests BOTTLES DRAWN AEROBIC AND ANAEROBIC 10CC  Final   Culture   Final           BLOOD CULTURE RECEIVED NO GROWTH TO DATE CULTURE WILL BE HELD FOR 5 DAYS BEFORE ISSUING A FINAL NEGATIVE REPORT Performed at [facility]    Report Status PENDING  Incomplete  Culture,  blood (routine x 2)     Status: None (Preliminary result)   Collection Time: 11/21/14  2:20 PM  Result Value Ref Range Status   Specimen Description BLOOD RIGHT FOREARM  Final   Special Requests BOTTLES DRAWN AEROBIC AND ANAEROBIC 10CC  Final   Culture   Final  BLOOD CULTURE RECEIVED NO GROWTH TO DATE CULTURE WILL BE HELD FOR 5 DAYS BEFORE ISSUING A FINAL NEGATIVE REPORT Performed at [facility]    Report Status PENDING  Incomplete     Scheduled Meds: . amLODipine  10 mg Oral Daily  . atorvastatin  20 mg Oral q1800  . cloNIDine  0.3 mg Oral TID  . diclofenac sodium  2 g Topical QID  . feeding supplement (RESOURCE BREEZE)  1 Container Oral BID BM  . ferrous sulfate  325 mg Oral Q breakfast  . gabapentin  100 mg Oral TID  . hydroxychloroquine  400 mg Oral Daily  . isosorbide mononitrate  60 mg Oral Daily  . labetalol  400 mg Oral BID  . multivitamin  1 tablet Oral Daily  . MUSCLE RUB  1 application Topical TID AC  . mycophenolate  1,000 mg Oral BID  . pantoprazole  40 mg Oral Q0600  . predniSONE  60 mg Oral Q breakfast  . sodium bicarbonate  1,300 mg Oral BID  . sodium chloride  3 mL Intravenous Q12H   Continuous Infusions:    Kayley Zeiders, DO  [facility] Pager [phone]  If 7PM-7AM, please contact night-coverage [email or web address] Password [email or web address] 11/22/2014, 7:54 AM   LOS: 4 days

## 2014-11-22 NOTE — Progress Notes (Signed)
Occupational Therapy Treatment Patient Details Name: [name] [name] MRN: [id number] DOB: Nov 12, 1960 Today's Date: 11/22/2014    History of present illness 54 y.o. female admitted to [facility] on 11/18/14 for abdominal pain, diarrhea, and black stool.  GI workup in progress.  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     OT comments  Pt seen today for ADL session to promote independence. Pt continues to require min guard for transfers and functional mobility, however required min (A) from low toilet. Pt with minimal tremors noted and c/o L>R in UEs. Pt will continue to benefit from acute OT for strengthening and endurance to promote safety with ADLs.   Follow Up Recommendations  Home health OT;Supervision/Assistance - 24 hour    Equipment Recommendations  None recommended by OT    Recommendations for Other Services      Precautions / Restrictions Precautions Precautions: Fall Precaution Comments: h/o ICH with Lt sided weakness  Restrictions Weight Bearing Restrictions: No       Mobility Bed Mobility Overal bed mobility: Modified Independent             General bed mobility comments: pt using bed rail to pull up to sitting.   Transfers Overall transfer level: Needs assistance Equipment used: Rolling walker (2 wheeled) Transfers: Sit to/from Stand Sit to Stand: Min guard;Min assist         General transfer comment: Min (A) from low toilet, however min guard from bed.         ADL Overall ADL's : Needs assistance/impaired     Grooming: Wash/dry Radiographer, therapeutic:  Ambulation;RW;Minimal assistance;Grab bars;Comfort height toilet Toilet Transfer Details (indicate cue type and reason): pt required min (A) to lower to toilet and to rise. Use of grab bar.  Toileting- Clothing Manipulation and Hygiene: Minimal assistance;Sit to/from stand Toileting - Clothing Manipulation Details (indicate cue type and reason): sit<>stand from comfort height toilet. Pt would likely require min guard/supervision from raised BSC     Functional mobility during ADLs: Min guard;Rolling walker General ADL Comments: Pt required min (A) for sit<>stand to low toilet and would likely do better from raised BSC. Pt fatigues, but did well with in-room ambulation to toilet and standing at sink for grooming tasks.                 Cognition  Arousal/Alertness: Awake/Alert Behavior During Therapy: WFL for tasks assessed/performed;Flat affect Overall Cognitive Status: Within Functional Limits for tasks assessed                                    Pertinent Vitals/ Pain       Pain Assessment: No/denies pain         Frequency Min 2X/week     Progress Toward Goals  OT Goals(current goals can now be found in the care plan section)  Progress towards OT goals: Progressing toward goals  Acute Rehab OT Goals Patient Stated Goal: to return home  OT Goal  Formulation: With patient Time For Goal Achievement: 12/04/14 Potential to Achieve Goals: Good  Plan Discharge plan remains appropriate       End of Session Equipment Utilized During Treatment: Gait belt;Rolling walker   Activity Tolerance Patient tolerated treatment well   Patient Left in bed;with call bell/phone within reach   Nurse Communication          Time: 2248-2500 OT Time Calculation (min): 22 min  Charges: OT General Charges $OT Visit: 1 Procedure OT Treatments $Self Care/Home Management : 8-22 mins  Villa Herb M 11/22/2014, 1:50 PM  Cyndie Chime, OTR/L Occupational  Therapist [phone] (pager)

## 2014-11-23 DIAGNOSIS — E43 Unspecified severe protein-calorie malnutrition: Secondary | ICD-10-CM

## 2014-11-23 DIAGNOSIS — J189 Pneumonia, unspecified organism: Secondary | ICD-10-CM

## 2014-11-23 LAB — GLUCOSE, CAPILLARY
GLUCOSE-CAPILLARY: 242 mg/dL — AB (ref 70–99)
GLUCOSE-CAPILLARY: 429 mg/dL — AB (ref 70–99)

## 2014-11-23 LAB — CBC
HEMATOCRIT: 22.4 % — AB (ref 36.0–46.0)
Hemoglobin: 7.4 g/dL — ABNORMAL LOW (ref 12.0–15.0)
MCH: 27.8 pg (ref 26.0–34.0)
MCHC: 33 g/dL (ref 30.0–36.0)
MCV: 84.2 fL (ref 78.0–100.0)
Platelets: 253 10*3/uL (ref 150–400)
RBC: 2.66 MIL/uL — ABNORMAL LOW (ref 3.87–5.11)
RDW: 14.5 % (ref 11.5–15.5)
WBC: 9.3 10*3/uL (ref 4.0–10.5)

## 2014-11-23 LAB — ANTI-NUCLEAR AB-TITER (ANA TITER)

## 2014-11-23 LAB — ANTI-DNA ANTIBODY, DOUBLE-STRANDED

## 2014-11-23 LAB — BASIC METABOLIC PANEL
Anion gap: 11 (ref 5–15)
BUN: 42 mg/dL — ABNORMAL HIGH (ref 6–23)
CO2: 19 mmol/L (ref 19–32)
Calcium: 8 mg/dL — ABNORMAL LOW (ref 8.4–10.5)
Chloride: 102 mEq/L (ref 96–112)
Creatinine, Ser: 2.87 mg/dL — ABNORMAL HIGH (ref 0.50–1.10)
GFR calc Af Amer: 21 mL/min — ABNORMAL LOW (ref >90)
GFR, EST NON AFRICAN AMERICAN: 18 mL/min — AB (ref >90)
GLUCOSE: 274 mg/dL — AB (ref 70–99)
Potassium: 4 mmol/L (ref 3.5–5.1)
SODIUM: 132 mmol/L — AB (ref 135–145)

## 2014-11-23 LAB — ANA: Anti Nuclear Antibody(ANA): POSITIVE — AB

## 2014-11-23 MED ORDER — GABAPENTIN 100 MG PO CAPS
100.0000 mg | ORAL_CAPSULE | Freq: Every day | ORAL | Status: DC
  Administered 2014-11-23 – 2014-11-24 (×2): 100 mg via ORAL
  Filled 2014-11-23 (×3): qty 1

## 2014-11-23 MED ORDER — PIPERACILLIN-TAZOBACTAM IN DEX 2-0.25 GM/50ML IV SOLN
2.2500 g | Freq: Four times a day (QID) | INTRAVENOUS | Status: DC
  Administered 2014-11-23 – 2014-11-25 (×9): 2.25 g via INTRAVENOUS
  Filled 2014-11-23 (×11): qty 50

## 2014-11-23 MED ORDER — INSULIN ASPART 100 UNIT/ML ~~LOC~~ SOLN
0.0000 [IU] | Freq: Three times a day (TID) | SUBCUTANEOUS | Status: DC
  Administered 2014-11-24: 5 [IU] via SUBCUTANEOUS

## 2014-11-23 MED ORDER — INSULIN GLARGINE 100 UNIT/ML ~~LOC~~ SOLN
10.0000 [IU] | Freq: Every day | SUBCUTANEOUS | Status: DC
  Administered 2014-11-23: 10 [IU] via SUBCUTANEOUS
  Filled 2014-11-23 (×2): qty 0.1

## 2014-11-23 MED ORDER — LABETALOL HCL 300 MG PO TABS
600.0000 mg | ORAL_TABLET | Freq: Two times a day (BID) | ORAL | Status: DC
  Administered 2014-11-23 – 2014-11-25 (×4): 600 mg via ORAL
  Filled 2014-11-23 (×5): qty 2

## 2014-11-23 MED ORDER — DOCUSATE SODIUM 100 MG PO CAPS
100.0000 mg | ORAL_CAPSULE | Freq: Two times a day (BID) | ORAL | Status: DC
  Administered 2014-11-23 – 2014-11-25 (×4): 100 mg via ORAL
  Filled 2014-11-23 (×5): qty 1

## 2014-11-23 MED ORDER — MAGNESIUM SULFATE 4 GM/100ML IV SOLN
4.0000 g | Freq: Once | INTRAVENOUS | Status: AC
  Administered 2014-11-23: 4 g via INTRAVENOUS
  Filled 2014-11-23: qty 100

## 2014-11-23 MED ORDER — BISACODYL 10 MG RE SUPP
10.0000 mg | Freq: Once | RECTAL | Status: AC
  Administered 2014-11-23: 10 mg via RECTAL
  Filled 2014-11-23: qty 1

## 2014-11-23 MED ORDER — INSULIN ASPART 100 UNIT/ML ~~LOC~~ SOLN
8.0000 [IU] | Freq: Once | SUBCUTANEOUS | Status: AC
  Administered 2014-11-24: 8 [IU] via SUBCUTANEOUS

## 2014-11-23 NOTE — Progress Notes (Signed)
Subjective: Patient reports that the tremor is improved but continues.    Objective: Current vital signs: BP 167/74 mmHg  Pulse 99  Temp(Src) 97.8 F (36.6 C) (Oral)  Resp 18  Ht 5\' 5"  (1.651 m)  Wt 64.3 kg (141 lb 12.1 oz)  BMI 23.59 kg/m2  SpO2 99%  LMP 07/18/2012 Vital signs in last 24 hours: Temp:  [97.8 F (36.6 C)-98.2 F (36.8 C)] 97.8 F (36.6 C) (01/21 0541) Pulse Rate:  [97-99] 99 (01/21 1028) Resp:  [1-18] 18 (01/21 0541) BP: (113-172)/(63-74) 167/74 mmHg (01/21 1028) SpO2:  [99 %-100 %] 99 % (01/21 0541) Weight:  [64.3 kg (141 lb 12.1 oz)] 64.3 kg (141 lb 12.1 oz) (01/21 0540)  Intake/Output from previous day: 01/20 0701 - 01/21 0700 In: 240 [P.O.:240] Out: 200 [Urine:200] Intake/Output this shift: Total I/O In: 240 [P.O.:240] Out: -  Nutritional status: Diet Heart  Neurologic Exam: Mental Status: Alert, oriented, thought content appropriate. Speech fluent without evidence of aphasia. Able to follow 3 step commands without difficulty. Cranial Nerves: II: Discs flat bilaterally; Visual fields grossly normal, pupils equal, round, reactive to light and accommodation III,IV, VI: ptosis not present, extra-ocular motions intact bilaterally V,VII: smile symmetric, facial light touch sensation normal bilaterally VIII: hearing normal bilaterally IX,X: gag reflex present XI: bilateral shoulder shrug XII: midline tongue extension Motor: Right :Upper extremity 5/5Left: Upper extremity 5/5 Lower extremity 5/5Lower extremity 5/5 Rest tremor noted bilaterally that improves with action. Less amplitude than previous evaluations.   Sensory: Pinprick and light touch intact throughout, bilaterally Deep Tendon Reflexes: 2+ and symmetric with absent AJ's bilaterally Plantars: Right: muteLeft: mute Cerebellar: normal  finger-to-nose and normal heel-to-shin testing bilaterally  Lab Results: Basic Metabolic Panel:  Recent Labs Lab 11/19/14 0603 11/20/14 0510 11/21/14 0516 11/22/14 0530 11/22/14 2046 11/23/14 0741  NA 132* 134* 135 134*  --  132*  K 4.0 3.7 3.8 3.7  --  4.0  CL 103 111 108 105  --  102  CO2 16* 18* 16* 17*  --  19  GLUCOSE 97 104* 111* 105*  --  274*  BUN 55* 43* 37* 34*  --  42*  CREATININE 3.24* 2.62* 2.71* 2.95*  --  2.87*  CALCIUM 7.3* 8.1* 7.8* 7.8*  --  8.0*  MG  --   --   --   --  1.0*  --     Liver Function Tests:  Recent Labs Lab 11/18/14 2324  AST 18  ALT 8  ALKPHOS 77  BILITOT 0.3  PROT 6.2  ALBUMIN 2.1*    Recent Labs Lab 11/19/14 0302  LIPASE 28    Recent Labs Lab 11/21/14 2108  AMMONIA 33*    CBC:  Recent Labs Lab 11/18/14 2324  11/20/14 0510 11/20/14 1645 11/21/14 0516 11/22/14 0530 11/23/14 0741  WBC 5.6  < > 4.0 3.3* 3.7* 8.4 9.3  NEUTROABS 4.5  --   --   --   --   --   --   HGB 5.4*  < > 7.3* 7.9* 7.6* 7.6* 7.4*  HCT 16.9*  < > 21.7* 23.6* 22.9* 23.1* 22.4*  MCV 83.3  < > 83.1 85.5 83.0 83.7 84.2  PLT 245  < > 205 216 221 231 253  < > = values in this interval not displayed.  Cardiac Enzymes:  Recent Labs Lab 11/19/14 0018 11/19/14 0302  CKTOTAL 36  --   TROPONINI  --  <0.03    Lipid Panel: No results for input(s): CHOL,  TRIG, HDL, CHOLHDL, VLDL, LDLCALC in the last 168 hours.  CBG:  Recent Labs Lab 11/20/14 0752 11/21/14 0811 11/22/14 0806 11/22/14 2214 11/23/14 0750  GLUCAP 95 109* 118* 362* 242*    Microbiology: Results for orders placed or performed during the hospital encounter of 11/18/14  MRSA PCR Screening     Status: None   Collection Time: 11/19/14  5:30 AM  Result Value Ref Range Status   MRSA by PCR NEGATIVE NEGATIVE Final    Comment:        The GeneXpert MRSA Assay (FDA approved for NASAL specimens only), is one component of a comprehensive MRSA colonization surveillance program. It  is not intended to diagnose MRSA infection nor to guide or monitor treatment for MRSA infections.   Culture, blood (routine x 2)     Status: None (Preliminary result)   Collection Time: 11/19/14  6:00 AM  Result Value Ref Range Status   Specimen Description BLOOD LEFT ARM  Final   Special Requests BOTTLES DRAWN AEROBIC AND ANAEROBIC 5CC  Final   Culture   Final           BLOOD CULTURE RECEIVED NO GROWTH TO DATE CULTURE WILL BE HELD FOR 5 DAYS BEFORE ISSUING A FINAL NEGATIVE REPORT Performed at [facility]    Report Status PENDING  Incomplete  Culture, blood (routine x 2)     Status: None (Preliminary result)   Collection Time: 11/19/14  6:54 AM  Result Value Ref Range Status   Specimen Description BLOOD RIGHT ARM  Final   Special Requests BOTTLES DRAWN AEROBIC AND ANAEROBIC 10CC  Final   Culture   Final           BLOOD CULTURE RECEIVED NO GROWTH TO DATE CULTURE WILL BE HELD FOR 5 DAYS BEFORE ISSUING A FINAL NEGATIVE REPORT Performed at [facility]    Report Status PENDING  Incomplete  Culture, blood (routine x 2)     Status: None (Preliminary result)   Collection Time: 11/21/14  2:05 PM  Result Value Ref Range Status   Specimen Description BLOOD RIGHT ANTECUBITAL  Final   Special Requests BOTTLES DRAWN AEROBIC AND ANAEROBIC 10CC  Final   Culture   Final           BLOOD CULTURE RECEIVED NO GROWTH TO DATE CULTURE WILL BE HELD FOR 5 DAYS BEFORE ISSUING A FINAL NEGATIVE REPORT Performed at [facility]    Report Status PENDING  Incomplete  Culture, blood (routine x 2)     Status: None (Preliminary result)   Collection Time: 11/21/14  2:20 PM  Result Value Ref Range Status   Specimen Description BLOOD RIGHT FOREARM  Final   Special Requests BOTTLES DRAWN AEROBIC AND ANAEROBIC 10CC  Final   Culture   Final           BLOOD CULTURE RECEIVED NO GROWTH TO DATE CULTURE WILL BE HELD FOR 5 DAYS BEFORE ISSUING A FINAL NEGATIVE REPORT Performed at [facility]    Report Status PENDING  Incomplete    Coagulation Studies: No results for input(s): LABPROT, INR in the last 72 hours.  Imaging: Dg Chest 2 View  11/22/2014   CLINICAL DATA:  Initial evaluation for fever  EXAM: CHEST  2 VIEW  COMPARISON:  11/06/2014  FINDINGS: Stable mild cardiac enlargement. Vascular pattern is normal. Mild patchy bibasilar infiltrates superior this is new from the prior study. Minimal blunting right costophrenic angle could indicate a tiny effusion.  IMPRESSION: New bibasilar patchy infiltrates concerning for possible pneumonia. Tiny right pleural effusion possible.   Electronically Signed   By: Skipper Cliche M.D.   On: 11/22/2014 08:28    Medications:  I have reviewed the patient's current medications. Scheduled: . amLODipine  10 mg Oral Daily  . atorvastatin  20 mg Oral q1800  . cloNIDine  0.3 mg Oral TID  . diclofenac sodium  2 g Topical QID  . feeding supplement (RESOURCE BREEZE)  1 Container Oral BID BM  . ferrous sulfate  325 mg Oral Q breakfast  . gabapentin  100 mg Oral QHS  . hydroxychloroquine  400 mg Oral Daily  . isosorbide mononitrate  60 mg Oral Daily  . labetalol  400 mg Oral BID  . multivitamin  1 tablet Oral Daily  . MUSCLE RUB  1 application Topical TID AC  . mycophenolate  1,000 mg Oral BID  . pantoprazole  40 mg Oral Q0600  . piperacillin-tazobactam (ZOSYN)  IV  2.25 g Intravenous 4 times per day  . predniSONE  60 mg Oral Q breakfast  . sodium bicarbonate  1,300 mg Oral BID  . sodium chloride  3 mL Intravenous Q12H    Assessment/Plan: Tremor slightly improved.  Neurontin dose decreased today.    Recommendations: 1.  Will continue to follow with you.     LOS: 5 days   Alexis Goodell, MD [facility] [phone] 11/23/2014  11:37 AM

## 2014-11-23 NOTE — Progress Notes (Addendum)
,           PROGRESS NOTE  CHARMIKA MERVINE YQM:[id number] DOB: 07/01/1961 DOA: 11/18/2014 PCP: No primary care provider on file.  Assessment/Plan: Lower GIB (gastrointestinal bleeding): -suspected secondary to Ischemic Colitis although CT of the abdomen did not show any evidence of colitis.  -Transfused 2 units of PRBC, posttransfusion hemoglobin remained stable. -Gastroenterology was consulted, no plans for endoscopic procedures at this time.  Fever -HAP vs RA/MCTD flare -Urinalysis negative for pyuria -11/21/2014 -Blood cultures 2 sets--pending -11/22/2014 Obtain chest x-ray--new patchy basilar infiltrates -Check uric acid--7.0 -C3, C4, CH 50--pending -dsDNA< 1 but ANA >1:2560 -ESR--124, CRP--12.9 HAP -start Zosyn -1/17 and 1/19 blood cultures neg Suspected ischemic Colitis:  -Given history of being on immunosuppressive, and loose stools, was empirically started on Cipro/Flagyl. -Patient received 3 days of Cipro and Flagyl after which it was discontinued -CT of the abdomen did not show any major abnormalities. No further diarrhea-in fact no further bowel movement since admission.  constipation  -Started cathartics   Acute Blood Loss Anemia:has baseline chronic anemia secondary to CKD(8.6 on 11/06/14)., acute anemia is secondary to blood loss from colitis. Transfused 2 units of PRBC on admission, hemoglobin currently up to 7.6.   Hyponatremia: -Improved. Suspect this was secondary to dehydration.   Metabolic Acidosis:secondary to CKD, continue Bicarbonate   CKD stage 4: -creatinine close to usual baseline. 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 Continue Cellcept.   Hx of MCTD/RA:continue Cellcept and Plaquenil. -pt missed appt with Dr. Dierdre Forth on 11/22/13 due to hospitalization--will need to be rescheduled -continue prednisone for now due to arthritis flare--plan to wean at d/c -will need ophth eval after d/c -was  noncompliant with meds and Rheum follow--had not seen prior Rheumatologist, Dr. 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  Tremor -improving but not gone -Ammonia--33 -serum B12, TSH normal -May be due to the patient's gabapentin--patient unwilling to discontinue medication at this time, but is willing to decrease dose  -appreciate neurology consult  Hyperglycemia -Secondary to steroids -10/12/2014 hemoglobin A1c 5.2 -Lantus 10 units at bedtime while the patient is on steroids -NovoLog sliding scale Severe protein calorie malnutrition -Continue nutritional supplement   Family Communication:   Pt at beside Disposition Plan:   Home when medically stable       Procedures/Studies: Ct Abdomen Pelvis Wo Contrast  11/19/2014   CLINICAL DATA:  54 y.o. female with past medical history of hypertension, lupus (on plaquenil and CellCept), chronic kidney disease-IV, recent history of intracranial hemorrhage, memberanous glomerulonephritis, GERD, history of adrenal insufficiency, who presents with abdominal pain, diarrhea and black stool. Initial encounter.  EXAM: CT ABDOMEN AND PELVIS WITHOUT CONTRAST  TECHNIQUE: Multidetector CT imaging of the abdomen and pelvis was performed following the standard protocol without IV contrast.  COMPARISON:  CT, 09/12/2009.  FINDINGS: Small effusions. The heart mildly enlarged. The linear/reticular lung base opacity is noted dependently consistent with subsegmental atelectasis. No convincing pulmonary edema.  Liver, spleen, gallbladder, pancreas: Unremarkable. No bile duct dilation. No adrenal masses.  2.3 cm low-density left midpole renal mass  consistent with a cyst, enlarged from the prior exam. No other renal masses, no stones and no hydronephrosis. Ureters normal course  and in caliber. Bladder is unremarkable.  No pathologically enlarged lymph nodes.  Small amount of reach fluid in the posterior pelvic recess.  No colonic wall thickening or mesenteric inflammation. No evidence of obstruction. No bowel dilation. Normal small bowel. Normal appendix.  No osteoblastic or osteolytic lesions.  IMPRESSION: 1. No acute findings in the abdomen pelvis. 2. No bowel inflammatory change. No findings to explain patient's abdominal pain or diarrhea or black stools. 3. Small amount of free fluid in the pelvis, nonspecific in this patient with a history chronic kidney disease. Small pleural effusions. Convincing pulmonary edema.   Electronically Signed   By: Amie Portland M.D.   On: 11/19/2014 21:59   Dg Chest 2 View  11/22/2014   CLINICAL DATA:  Initial evaluation for fever  EXAM: CHEST  2 VIEW  COMPARISON:  11/06/2014  FINDINGS: Stable mild cardiac enlargement. Vascular pattern is normal. Mild patchy bibasilar infiltrates superior this is new from the prior study. Minimal blunting right costophrenic angle could indicate a tiny effusion.  IMPRESSION: New bibasilar patchy infiltrates concerning for possible pneumonia. Tiny right pleural effusion possible.   Electronically Signed   By: Esperanza Heir M.D.   On: 11/22/2014 08:28   Dg Chest 2 View  11/06/2014   CLINICAL DATA:  Initial evaluation for dysphagia and chest pain, shortness of breath, personal history of lupus and stroke, patient smokes  EXAM: CHEST  2 VIEW  COMPARISON:  10/10/2014  FINDINGS: Mild cardiac enlargement. Vascular pattern is normal. No consolidation or effusion. No change from prior study.  IMPRESSION: Stable cardiac enlargement with no acute findings   Electronically Signed   By: Esperanza Heir M.D.   On: 11/06/2014 14:29   Dg Abd Acute W/chest  11/19/2014   CLINICAL DATA:  Diarrhea for  2 weeks.  EXAM: ACUTE ABDOMEN SERIES (ABDOMEN 2 VIEW & CHEST 1 VIEW)  COMPARISON:  Chest radiograph 11/06/2014  FINDINGS: Mild cardiomegaly is stable. The lungs are clear. There is no free intra-abdominal air. No dilated bowel loops to suggest obstruction. There is questionable colonic wall thickening involving the rectosigmoid colon. No radiopaque calculi. No acute osseous abnormalities are seen.  IMPRESSION: 1. Nonobstructive bowel gas pattern. No free air. There is questionable wall thickening of the sigmoid colon, may reflect enteritis/colitis. 2. Stable mild cardiomegaly.   Electronically Signed   By: Rubye Oaks M.D.   On: 11/19/2014 02:16         Subjective: Patient complains of abdominal bloating and constipation. Denies any fevers, chills, chest pain, nausea, vomiting, diarrhea. She complains of some shortness of breath.  Objective: Filed Vitals:   11/23/14 0540 11/23/14 0541 11/23/14 1028 11/23/14 1456  BP:  172/71 167/74 125/76  Pulse:  99 99 94  Temp:  97.8 F (36.6 C)  97.3 F (36.3 C)  TempSrc:  Oral  Oral  Resp:  18  20  Height:      Weight: 64.3 kg (141 lb 12.1 oz)     SpO2:  99%  99%    Intake/Output Summary (Last 24 hours) at 11/23/14 1612 Last data filed at 11/23/14 1458  Gross per 24 hour  Intake    960 ml  Output    600 ml  Net    360 ml   Weight change: 5.7 kg (12 lb 9.1 oz) Exam:   General:  Pt is alert, follows commands appropriately, not in acute distress  HEENT: No icterus, No thrush, No neck mass, [place]/AT  Cardiovascular: RRR, S1/S2, no rubs,  no gallops  Respiratory: Bibasilar crackles, right greater than left. No wheezing.  Abdomen: Soft/+BS, non tender, non distended, no guarding  Extremities: No edema, No lymphangitis, No petechiae, No rashes, no synovitis  Data Reviewed: Basic Metabolic Panel:  Recent Labs Lab 11/19/14 0603 11/20/14 0510 11/21/14 0516 11/22/14 0530 11/22/14 2046 11/23/14 0741  NA 132* 134* 135 134*  --   132*  K 4.0 3.7 3.8 3.7  --  4.0  CL 103 111 108 105  --  102  CO2 16* 18* 16* 17*  --  19  GLUCOSE 97 104* 111* 105*  --  274*  BUN 55* 43* 37* 34*  --  42*  CREATININE 3.24* 2.62* 2.71* 2.95*  --  2.87*  CALCIUM 7.3* 8.1* 7.8* 7.8*  --  8.0*  MG  --   --   --   --  1.0*  --    Liver Function Tests:  Recent Labs Lab 11/18/14 2324  AST 18  ALT 8  ALKPHOS 77  BILITOT 0.3  PROT 6.2  ALBUMIN 2.1*    Recent Labs Lab 11/19/14 0302  LIPASE 28    Recent Labs Lab 11/21/14 2108  AMMONIA 33*   CBC:  Recent Labs Lab 11/18/14 2324  11/20/14 0510 11/20/14 1645 11/21/14 0516 11/22/14 0530 11/23/14 0741  WBC 5.6  < > 4.0 3.3* 3.7* 8.4 9.3  NEUTROABS 4.5  --   --   --   --   --   --   HGB 5.4*  < > 7.3* 7.9* 7.6* 7.6* 7.4*  HCT 16.9*  < > 21.7* 23.6* 22.9* 23.1* 22.4*  MCV 83.3  < > 83.1 85.5 83.0 83.7 84.2  PLT 245  < > 205 216 221 231 253  < > = values in this interval not displayed. Cardiac Enzymes:  Recent Labs Lab 11/19/14 0018 11/19/14 0302  CKTOTAL 36  --   TROPONINI  --  <0.03   BNP: Invalid input(s): POCBNP CBG:  Recent Labs Lab 11/20/14 0752 11/21/14 0811 11/22/14 0806 11/22/14 2214 11/23/14 0750  GLUCAP 95 109* 118* 362* 242*    Recent Results (from the past 240 hour(s))  MRSA PCR Screening     Status: None   Collection Time: 11/19/14  5:30 AM  Result Value Ref Range Status   MRSA by PCR NEGATIVE NEGATIVE Final    Comment:        The GeneXpert MRSA Assay (FDA approved for NASAL specimens only), is one component of a comprehensive MRSA colonization surveillance program. It is not intended to diagnose MRSA infection nor to guide or monitor treatment for MRSA infections.   Culture, blood (routine x 2)     Status: None (Preliminary result)   Collection Time: 11/19/14  6:00 AM  Result Value Ref Range Status   Specimen Description BLOOD LEFT ARM  Final   Special Requests BOTTLES DRAWN AEROBIC AND ANAEROBIC 5CC  Final   Culture    Final           BLOOD CULTURE RECEIVED NO GROWTH TO DATE CULTURE WILL BE HELD FOR 5 DAYS BEFORE ISSUING A FINAL NEGATIVE REPORT Performed at [facility]    Report Status PENDING  Incomplete  Culture, blood (routine x 2)     Status: None (Preliminary result)   Collection Time: 11/19/14  6:54 AM  Result Value Ref Range Status   Specimen Description BLOOD RIGHT ARM  Final   Special Requests BOTTLES DRAWN AEROBIC AND ANAEROBIC 10CC  Final  Culture   Final           BLOOD CULTURE RECEIVED NO GROWTH TO DATE CULTURE WILL BE HELD FOR 5 DAYS BEFORE ISSUING A FINAL NEGATIVE REPORT Performed at [facility]    Report Status PENDING  Incomplete  Culture, blood (routine x 2)     Status: None (Preliminary result)   Collection Time: 11/21/14  2:05 PM  Result Value Ref Range Status   Specimen Description BLOOD RIGHT ANTECUBITAL  Final   Special Requests BOTTLES DRAWN AEROBIC AND ANAEROBIC 10CC  Final   Culture   Final           BLOOD CULTURE RECEIVED NO GROWTH TO DATE CULTURE WILL BE HELD FOR 5 DAYS BEFORE ISSUING A FINAL NEGATIVE REPORT Performed at [facility]    Report Status PENDING  Incomplete  Culture, blood (routine x 2)     Status: None (Preliminary result)   Collection Time: 11/21/14  2:20 PM  Result Value Ref Range Status   Specimen Description BLOOD RIGHT FOREARM  Final   Special Requests BOTTLES DRAWN AEROBIC AND ANAEROBIC 10CC  Final   Culture   Final           BLOOD CULTURE RECEIVED NO GROWTH TO DATE CULTURE WILL BE HELD FOR 5 DAYS BEFORE ISSUING A FINAL NEGATIVE REPORT Performed at [facility]    Report Status PENDING  Incomplete     Scheduled Meds: . amLODipine  10 mg Oral Daily  . atorvastatin  20 mg Oral q1800  . bisacodyl  10 mg Rectal Once  . cloNIDine  0.3 mg Oral TID  . diclofenac sodium  2 g Topical QID  . docusate sodium  100 mg Oral BID  . feeding supplement (RESOURCE BREEZE)  1 Container Oral BID BM  . ferrous sulfate  325 mg  Oral Q breakfast  . gabapentin  100 mg Oral QHS  . hydroxychloroquine  400 mg Oral Daily  . isosorbide mononitrate  60 mg Oral Daily  . labetalol  400 mg Oral BID  . multivitamin  1 tablet Oral Daily  . MUSCLE RUB  1 application Topical TID AC  . mycophenolate  1,000 mg Oral BID  . pantoprazole  40 mg Oral Q0600  . piperacillin-tazobactam (ZOSYN)  IV  2.25 g Intravenous 4 times per day  . predniSONE  60 mg Oral Q breakfast  . sodium bicarbonate  1,300 mg Oral BID  . sodium chloride  3 mL Intravenous Q12H   Continuous Infusions:    Renia Mikelson, DO  [facility] Pager [phone]  If 7PM-7AM, please contact night-coverage [email or web address] Password [email or web address] 11/23/2014, 4:12 PM   LOS: 5 days

## 2014-11-23 NOTE — Progress Notes (Signed)
Inpatient Diabetes Program Recommendations  AACE/ADA: New Consensus Statement on Inpatient Glycemic Control (2013)  Target Ranges:  Prepandial:   less than 140 mg/dL      Peak postprandial:   less than 180 mg/dL (1-2 hours)      Critically ill patients:  140 - 180 mg/dL   Reason for assessment: elevated CBG  Results for [facility] (MRN [id number]) as of 11/23/2014 15:57  Ref. Range 11/22/2014 08:06 11/22/2014 22:14 11/23/2014 07:50  Glucose-Capillary Latest Range: 70-99 mg/dL 118 (H) 362 (H) 242 (H)    Diabetes history: Type 2 Outpatient Diabetes medications: none Current orders for Inpatient glycemic control: None On Prednisone 60 mg QD  Please consider starting Lantus 10 units qday and Novolog 3 units tid with meals. Add Novolog moderate tidwc and hs Add CHO mod med to heart healthy diet. Hgba1C is 5.2% - not accurate with low hemoglobin  Will continue to follow. Thank you. Lorenda Peck, RD, LDN, CDE Inpatient Diabetes Coordinator [phone]

## 2014-11-24 LAB — MAGNESIUM: Magnesium: 2.1 mg/dL (ref 1.5–2.5)

## 2014-11-24 LAB — GLUCOSE, CAPILLARY
Glucose-Capillary: 282 mg/dL — ABNORMAL HIGH (ref 70–99)
Glucose-Capillary: 404 mg/dL — ABNORMAL HIGH (ref 70–99)
Glucose-Capillary: 450 mg/dL — ABNORMAL HIGH (ref 70–99)
Glucose-Capillary: 381 mg/dL — ABNORMAL HIGH (ref 70–99)
Glucose-Capillary: 327 mg/dL — ABNORMAL HIGH (ref 70–99)

## 2014-11-24 LAB — CBC
HCT: 22.9 % — ABNORMAL LOW (ref 36.0–46.0)
Hemoglobin: 7.5 g/dL — ABNORMAL LOW (ref 12.0–15.0)
MCH: 27.5 pg (ref 26.0–34.0)
MCHC: 32.8 g/dL (ref 30.0–36.0)
MCV: 83.9 fL (ref 78.0–100.0)
Platelets: 259 10*3/uL (ref 150–400)
RBC: 2.73 MIL/uL — ABNORMAL LOW (ref 3.87–5.11)
RDW: 14.5 % (ref 11.5–15.5)
WBC: 9.8 10*3/uL (ref 4.0–10.5)

## 2014-11-24 LAB — BASIC METABOLIC PANEL
ANION GAP: 13 (ref 5–15)
BUN: 47 mg/dL — ABNORMAL HIGH (ref 6–23)
CO2: 20 mmol/L (ref 19–32)
Calcium: 8.6 mg/dL (ref 8.4–10.5)
Chloride: 100 mEq/L (ref 96–112)
Creatinine, Ser: 2.99 mg/dL — ABNORMAL HIGH (ref 0.50–1.10)
GFR calc Af Amer: 20 mL/min — ABNORMAL LOW (ref >90)
GFR calc non Af Amer: 17 mL/min — ABNORMAL LOW (ref >90)
GLUCOSE: 387 mg/dL — AB (ref 70–99)
POTASSIUM: 4 mmol/L (ref 3.5–5.1)
Sodium: 133 mmol/L — ABNORMAL LOW (ref 135–145)

## 2014-11-24 LAB — COMPLEMENT, TOTAL: Compl, Total (CH50): 52 U/mL (ref 31–60)

## 2014-11-24 MED ORDER — INSULIN ASPART 100 UNIT/ML ~~LOC~~ SOLN: 4.0000 [IU] | Freq: Three times a day (TID) | SUBCUTANEOUS | Status: DC

## 2014-11-24 MED ORDER — INSULIN ASPART 100 UNIT/ML ~~LOC~~ SOLN
0.0000 [IU] | Freq: Three times a day (TID) | SUBCUTANEOUS | Status: DC
  Administered 2014-11-25: 3 [IU] via SUBCUTANEOUS

## 2014-11-24 MED ORDER — INSULIN GLARGINE 100 UNIT/ML ~~LOC~~ SOLN
12.0000 [IU] | Freq: Every day | SUBCUTANEOUS | Status: DC
  Administered 2014-11-24: 12 [IU] via SUBCUTANEOUS
  Filled 2014-11-24 (×2): qty 0.12

## 2014-11-24 MED ORDER — CLOTRIMAZOLE 10 MG MT TROC
10.0000 mg | Freq: Every day | OROMUCOSAL | Status: DC
  Administered 2014-11-24 – 2014-11-25 (×4): 10 mg via ORAL
  Filled 2014-11-24 (×8): qty 1

## 2014-11-24 MED ORDER — PREDNISONE 20 MG PO TABS
40.0000 mg | ORAL_TABLET | Freq: Every day | ORAL | Status: DC
  Filled 2014-11-24: qty 2

## 2014-11-24 MED ORDER — INSULIN ASPART 100 UNIT/ML ~~LOC~~ SOLN
15.0000 [IU] | Freq: Once | SUBCUTANEOUS | Status: AC
  Administered 2014-11-24: 15 [IU] via SUBCUTANEOUS

## 2014-11-24 MED ORDER — PREDNISONE 20 MG PO TABS
30.0000 mg | ORAL_TABLET | Freq: Every day | ORAL | Status: DC
  Administered 2014-11-25: 30 mg via ORAL
  Filled 2014-11-24 (×2): qty 1

## 2014-11-24 MED ORDER — INSULIN GLARGINE 100 UNIT/ML ~~LOC~~ SOLN
15.0000 [IU] | Freq: Every day | SUBCUTANEOUS | Status: DC
  Filled 2014-11-24: qty 0.15

## 2014-11-24 MED ORDER — INSULIN ASPART 100 UNIT/ML ~~LOC~~ SOLN
20.0000 [IU] | Freq: Once | SUBCUTANEOUS | Status: AC
  Administered 2014-11-24: 20 [IU] via SUBCUTANEOUS

## 2014-11-24 MED ORDER — INSULIN ASPART 100 UNIT/ML ~~LOC~~ SOLN
0.0000 [IU] | Freq: Every day | SUBCUTANEOUS | Status: DC
  Administered 2014-11-24: 4 [IU] via SUBCUTANEOUS

## 2014-11-24 NOTE — Progress Notes (Signed)
Subjective: Tremor improved.  Patient reports that she is on Neurontin for paresthesias that are unbearable.  They have not returned.    Objective: Current vital signs: BP 165/76 mmHg  Pulse 98  Temp(Src) 97.7 F (36.5 C) (Oral)  Resp 15  Ht 5\' 5"  (1.651 m)  Wt 64.728 kg (142 lb 11.2 oz)  BMI 23.75 kg/m2  SpO2 100%  LMP 07/18/2012 Vital signs in last 24 hours: Temp:  [97.3 F (36.3 C)-97.7 F (36.5 C)] 97.7 F (36.5 C) (01/22 0541) Pulse Rate:  [94-99] 98 (01/22 0541) Resp:  [15-20] 15 (01/22 0541) BP: (125-167)/(61-76) 165/76 mmHg (01/22 0541) SpO2:  [99 %-100 %] 100 % (01/22 0541) Weight:  [64.728 kg (142 lb 11.2 oz)] 64.728 kg (142 lb 11.2 oz) (01/22 0544)  Intake/Output from previous day: 01/21 0701 - 01/22 0700 In: 1700 [P.O.:1400; IV Piggyback:300] Out: 1050 [Urine:1050] Intake/Output this shift:   Nutritional status: Diet Heart  Neurologic Exam: Mental Status: Alert, oriented, thought content appropriate. Speech fluent without evidence of aphasia. Able to follow 3 step commands without difficulty. Cranial Nerves: II: Discs flat bilaterally; Visual fields grossly normal, pupils equal, round, reactive to light and accommodation III,IV, VI: ptosis not present, extra-ocular motions intact bilaterally V,VII: smile symmetric, facial light touch sensation normal bilaterally VIII: hearing normal bilaterally IX,X: gag reflex present XI: bilateral shoulder shrug XII: midline tongue extension Motor: Right :Upper extremity 5/5Left: Upper extremity 5/5 Lower extremity 5/5Lower extremity 5/5 Minimal tremor.   Sensory: Pinprick and light touch intact throughout, bilaterally Deep Tendon Reflexes: 2+ and symmetric with absent AJ's bilaterally Plantars: Right: muteLeft: mute Cerebellar: normal finger-to-nose and normal  heel-to-shin testing bilaterally  Lab Results: Basic Metabolic Panel:  Recent Labs Lab 11/20/14 0510 11/21/14 0516 11/22/14 0530 11/22/14 2046 11/23/14 0741 11/24/14 0600  NA 134* 135 134*  --  132* 133*  K 3.7 3.8 3.7  --  4.0 4.0  CL 111 108 105  --  102 100  CO2 18* 16* 17*  --  19 20  GLUCOSE 104* 111* 105*  --  274* 387*  BUN 43* 37* 34*  --  42* 47*  CREATININE 2.62* 2.71* 2.95*  --  2.87* 2.99*  CALCIUM 8.1* 7.8* 7.8*  --  8.0* 8.6  MG  --   --   --  1.0*  --  2.1    Liver Function Tests:  Recent Labs Lab 11/18/14 2324  AST 18  ALT 8  ALKPHOS 77  BILITOT 0.3  PROT 6.2  ALBUMIN 2.1*    Recent Labs Lab 11/19/14 0302  LIPASE 28    Recent Labs Lab 11/21/14 2108  AMMONIA 33*    CBC:  Recent Labs Lab 11/18/14 2324  11/20/14 1645 11/21/14 0516 11/22/14 0530 11/23/14 0741 11/24/14 0600  WBC 5.6  < > 3.3* 3.7* 8.4 9.3 9.8  NEUTROABS 4.5  --   --   --   --   --   --   HGB 5.4*  < > 7.9* 7.6* 7.6* 7.4* 7.5*  HCT 16.9*  < > 23.6* 22.9* 23.1* 22.4* 22.9*  MCV 83.3  < > 85.5 83.0 83.7 84.2 83.9  PLT 245  < > 216 221 231 253 259  < > = values in this interval not displayed.  Cardiac Enzymes:  Recent Labs Lab 11/19/14 0018 11/19/14 0302  CKTOTAL 36  --   TROPONINI  --  <0.03    Lipid Panel: No results for input(s): CHOL, TRIG, HDL, CHOLHDL, VLDL, LDLCALC in  the last 168 hours.  CBG:  Recent Labs Lab 11/22/14 0806 11/22/14 2214 11/23/14 0750 11/23/14 2319 11/24/14 0853  GLUCAP 118* 362* 242* 429* 282*    Microbiology: Results for orders placed or performed during the hospital encounter of 11/18/14  MRSA PCR Screening     Status: None   Collection Time: 11/19/14  5:30 AM  Result Value Ref Range Status   MRSA by PCR NEGATIVE NEGATIVE Final    Comment:        The GeneXpert MRSA Assay (FDA approved for NASAL specimens only), is one component of a comprehensive MRSA colonization surveillance program. It is not intended to  diagnose MRSA infection nor to guide or monitor treatment for MRSA infections.   Culture, blood (routine x 2)     Status: None (Preliminary result)   Collection Time: 11/19/14  6:00 AM  Result Value Ref Range Status   Specimen Description BLOOD LEFT ARM  Final   Special Requests BOTTLES DRAWN AEROBIC AND ANAEROBIC 5CC  Final   Culture   Final           BLOOD CULTURE RECEIVED NO GROWTH TO DATE CULTURE WILL BE HELD FOR 5 DAYS BEFORE ISSUING A FINAL NEGATIVE REPORT Performed at [facility]    Report Status PENDING  Incomplete  Culture, blood (routine x 2)     Status: None (Preliminary result)   Collection Time: 11/19/14  6:54 AM  Result Value Ref Range Status   Specimen Description BLOOD RIGHT ARM  Final   Special Requests BOTTLES DRAWN AEROBIC AND ANAEROBIC 10CC  Final   Culture   Final           BLOOD CULTURE RECEIVED NO GROWTH TO DATE CULTURE WILL BE HELD FOR 5 DAYS BEFORE ISSUING A FINAL NEGATIVE REPORT Performed at [facility]    Report Status PENDING  Incomplete  Culture, blood (routine x 2)     Status: None (Preliminary result)   Collection Time: 11/21/14  2:05 PM  Result Value Ref Range Status   Specimen Description BLOOD RIGHT ANTECUBITAL  Final   Special Requests BOTTLES DRAWN AEROBIC AND ANAEROBIC 10CC  Final   Culture   Final           BLOOD CULTURE RECEIVED NO GROWTH TO DATE CULTURE WILL BE HELD FOR 5 DAYS BEFORE ISSUING A FINAL NEGATIVE REPORT Performed at [facility]    Report Status PENDING  Incomplete  Culture, blood (routine x 2)     Status: None (Preliminary result)   Collection Time: 11/21/14  2:20 PM  Result Value Ref Range Status   Specimen Description BLOOD RIGHT FOREARM  Final   Special Requests BOTTLES DRAWN AEROBIC AND ANAEROBIC 10CC  Final   Culture   Final           BLOOD CULTURE RECEIVED NO GROWTH TO DATE CULTURE WILL BE HELD FOR 5 DAYS BEFORE ISSUING A FINAL NEGATIVE REPORT Performed at [facility]    Report  Status PENDING  Incomplete    Coagulation Studies: No results for input(s): LABPROT, INR in the last 72 hours.  Imaging: No results found.  Medications:  I have reviewed the patient's current medications. Scheduled: . amLODipine  10 mg Oral Daily  . atorvastatin  20 mg Oral q1800  . cloNIDine  0.3 mg Oral TID  . diclofenac sodium  2 g Topical QID  . docusate sodium  100 mg Oral BID  . feeding supplement (RESOURCE BREEZE)  1 Container  Oral BID BM  . ferrous sulfate  325 mg Oral Q breakfast  . gabapentin  100 mg Oral QHS  . hydroxychloroquine  400 mg Oral Daily  . insulin aspart  0-9 Units Subcutaneous TID WC  . insulin glargine  10 Units Subcutaneous QHS  . isosorbide mononitrate  60 mg Oral Daily  . labetalol  600 mg Oral BID  . multivitamin  1 tablet Oral Daily  . MUSCLE RUB  1 application Topical TID AC  . mycophenolate  1,000 mg Oral BID  . pantoprazole  40 mg Oral Q0600  . piperacillin-tazobactam (ZOSYN)  IV  2.25 g Intravenous 4 times per day  . predniSONE  60 mg Oral Q breakfast  . sodium bicarbonate  1,300 mg Oral BID  . sodium chloride  3 mL Intravenous Q12H    Assessment/Plan: Tremor markedly improved.  Paresthesias still controlled on lower dose of Neurontin.  Recommendations: 1.  Agree with current management   LOS: 6 days   Alexis Goodell, MD [facility] [phone] 11/24/2014  10:04 AM

## 2014-11-24 NOTE — Progress Notes (Signed)
Inpatient Diabetes Program Recommendations  AACE/ADA: New Consensus Statement on Inpatient Glycemic Control (2013)  Target Ranges:  Prepandial:   less than 140 mg/dL      Peak postprandial:   less than 180 mg/dL (1-2 hours)      Critically ill patients:  140 - 180 mg/dL   Reason for Assessment: Hyperglycemia  Inpatient Diabetes Program Recommendations Insulin - Basal: Increase Lantus to 12 units QHS Correction (SSI): Increase Novolog to moderate and add HS coverage Insulin - Meal Coverage: Add Novolog 4 units tidwc for meal coverage while on steroids Diet: Add CHO mod to heart healthy diet  Note: Will continue to follow. Thank you. Lorenda Peck, RD, LDN, CDE Inpatient Diabetes Coordinator [phone]

## 2014-11-24 NOTE — Progress Notes (Signed)
PROGRESS NOTE  [name] [name] FAO:[id number] DOB: 16-Apr-1961 DOA: 11/18/2014 PCP: No primary care provider on file.  Assessment/Plan: Lower GIB (gastrointestinal bleeding): -suspected secondary to Ischemic Colitis although CT of the abdomen did not show any evidence of colitis.  -Transfused 2 units of PRBC, posttransfusion hemoglobin remained stable. -Gastroenterology was consulted, no plans for endoscopic procedures at this time.  Fever -HAP vs RA/MCTD flare -Urinalysis negative for pyuria -11/21/2014 -Blood cultures 2 sets--pending -11/22/2014 Obtain chest x-ray--new patchy basilar infiltrates -Check uric acid--7.0 -C3, C4, CH 50--pending -dsDNA< 1 but ANA >1:2560 -ESR--124, CRP--12.9 -improved with steroids HAP -continue Zosyn -1/17 and 1/19 blood cultures neg Suspected ischemic Colitis:  -Given history of being on immunosuppressive, and loose stools, was empirically started on Cipro/Flagyl. -Patient received 3 days of Cipro and Flagyl after which it was discontinued -CT of the abdomen did not show any major abnormalities. No further diarrhea-in fact no further bowel movement since admission. constipation  -Started cathartics   Acute Blood Loss Anemia:has baseline chronic anemia secondary to CKD(8.6 on 11/06/14)., acute anemia is secondary to blood loss from colitis. Transfused 2 units of PRBC on admission, hemoglobin currently up to 7.5.   Hyponatremia: -Improved. Suspect this was secondary to dehydration.   Metabolic Acidosis:secondary to CKD, continue Bicarbonate   CKD stage 4: -creatinine close to usual baseline. 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 Continue Cellcept.   Hx of MCTD/RA:continue Cellcept and Plaquenil. -pt missed appt with Dr. Dierdre Forth on 11/22/13 due to hospitalization--will need to be rescheduled -continue prednisone for now due to arthritis flare--plan to wean at d/c -will need  ophth eval after d/c -was noncompliant with meds and Rheum follow--had not seen prior Rheumatologist, Dr. 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  Tremor -improving but not gone with decreased gabapentin dose -Ammonia--33 -serum B12, TSH normal -May be due to the patient's gabapentin--patient unwilling to discontinue medication at this time, but is willing to decrease dose  -appreciate neurology consult  Hyperglycemia -Secondary to steroids -10/12/2014 hemoglobin A1c 5.2 -Increase Lantus 15 units at bedtime while the patient is on steroids -NovoLog sliding scale -novolog 4 units with meals -change to carb-modified diet Severe protein calorie malnutrition -Continue nutritional supplement   Family Communication: Pt at beside Disposition Plan: Home 1/23         Procedures/Studies: Ct Abdomen Pelvis Wo Contrast  11/19/2014   CLINICAL DATA:  54 y.o. female with past medical history of hypertension, lupus (on plaquenil and CellCept), chronic kidney disease-IV, recent history of intracranial hemorrhage, memberanous glomerulonephritis, GERD, history of adrenal insufficiency, who presents with abdominal pain, diarrhea and black stool. Initial encounter.  EXAM: CT ABDOMEN AND PELVIS WITHOUT CONTRAST  TECHNIQUE: Multidetector CT imaging of the abdomen and pelvis was performed following the standard protocol without IV contrast.  COMPARISON:  CT, 09/12/2009.  FINDINGS: Small effusions. The heart mildly enlarged. The linear/reticular lung base opacity is noted dependently consistent with subsegmental atelectasis. No convincing pulmonary edema.  Liver, spleen, gallbladder,  pancreas: Unremarkable. No bile duct dilation. No adrenal masses.  2.3 cm low-density left midpole renal mass consistent with a cyst, enlarged from the prior exam. No other  renal masses, no stones and no hydronephrosis. Ureters normal course and in caliber. Bladder is unremarkable.  No pathologically enlarged lymph nodes.  Small amount of reach fluid in the posterior pelvic recess.  No colonic wall thickening or mesenteric inflammation. No evidence of obstruction. No bowel dilation. Normal small bowel. Normal appendix.  No osteoblastic or osteolytic lesions.  IMPRESSION: 1. No acute findings in the abdomen pelvis. 2. No bowel inflammatory change. No findings to explain patient's abdominal pain or diarrhea or black stools. 3. Small amount of free fluid in the pelvis, nonspecific in this patient with a history chronic kidney disease. Small pleural effusions. Convincing pulmonary edema.   Electronically Signed   By: Amie Portland M.D.   On: 11/19/2014 21:59   Dg Chest 2 View  11/22/2014   CLINICAL DATA:  Initial evaluation for fever  EXAM: CHEST  2 VIEW  COMPARISON:  11/06/2014  FINDINGS: Stable mild cardiac enlargement. Vascular pattern is normal. Mild patchy bibasilar infiltrates superior this is new from the prior study. Minimal blunting right costophrenic angle could indicate a tiny effusion.  IMPRESSION: New bibasilar patchy infiltrates concerning for possible pneumonia. Tiny right pleural effusion possible.   Electronically Signed   By: Esperanza Heir M.D.   On: 11/22/2014 08:28   Dg Chest 2 View  11/06/2014   CLINICAL DATA:  Initial evaluation for dysphagia and chest pain, shortness of breath, personal history of lupus and stroke, patient smokes  EXAM: CHEST  2 VIEW  COMPARISON:  10/10/2014  FINDINGS: Mild cardiac enlargement. Vascular pattern is normal. No consolidation or effusion. No change from prior study.  IMPRESSION: Stable cardiac enlargement with no acute findings   Electronically Signed   By:  Esperanza Heir M.D.   On: 11/06/2014 14:29   Dg Abd Acute W/chest  11/19/2014   CLINICAL DATA:  Diarrhea for 2 weeks.  EXAM: ACUTE ABDOMEN SERIES (ABDOMEN 2 VIEW & CHEST 1 VIEW)  COMPARISON:  Chest radiograph 11/06/2014  FINDINGS: Mild cardiomegaly is stable. The lungs are clear. There is no free intra-abdominal air. No dilated bowel loops to suggest obstruction. There is questionable colonic wall thickening involving the rectosigmoid colon. No radiopaque calculi. No acute osseous abnormalities are seen.  IMPRESSION: 1. Nonobstructive bowel gas pattern. No free air. There is questionable wall thickening of the sigmoid colon, may reflect enteritis/colitis. 2. Stable mild cardiomegaly.   Electronically Signed   By: Rubye Oaks M.D.   On: 11/19/2014 02:16         Subjective: Patient is feeling much better. She denies any chest pain, shortness breath, nausea, vomiting, diarrhea, abdominal pain. No further fevers or chills. Arthralgias are improved.  Objective: Filed Vitals:   11/23/14 2130 11/24/14 0541 11/24/14 0544 11/24/14 1404  BP: 165/75 165/76  138/84  Pulse: 94 98    Temp: 97.5 F (36.4 C) 97.7 F (36.5 C)  98.6 F (37 C)  TempSrc: Oral Oral    Resp: 20 15  16   Height:      Weight:   64.728 kg (142 lb 11.2 oz)   SpO2: 100% 100%  99%    Intake/Output Summary (Last 24 hours) at 11/24/14 1811 Last data filed at 11/24/14 1540  Gross per 24 hour  Intake   1620 ml  Output    650 ml  Net    970 ml   Weight change: 0.428 kg (15.1 oz) Exam:   General:  Pt is alert, follows commands appropriately, not in acute distress  HEENT: No icterus,  No thrush,  [place]/AT  Cardiovascular: RRR, S1/S2, no rubs, no gallops  Respiratory: Bibasilar crackles. No wheezing. Good air movement.  Abdomen: Soft/+BS, non tender, non distended, no guarding  Extremities: No edema, No lymphangitis, No petechiae, No rashes, no synovitis  Data Reviewed: Basic Metabolic Panel:  Recent Labs Lab  11/20/14 0510 11/21/14 0516 11/22/14 0530 11/22/14 2046 11/23/14 0741 11/24/14 0600  NA 134* 135 134*  --  132* 133*  K 3.7 3.8 3.7  --  4.0 4.0  CL 111 108 105  --  102 100  CO2 18* 16* 17*  --  19 20  GLUCOSE 104* 111* 105*  --  274* 387*  BUN 43* 37* 34*  --  42* 47*  CREATININE 2.62* 2.71* 2.95*  --  2.87* 2.99*  CALCIUM 8.1* 7.8* 7.8*  --  8.0* 8.6  MG  --   --   --  1.0*  --  2.1   Liver Function Tests:  Recent Labs Lab 11/18/14 2324  AST 18  ALT 8  ALKPHOS 77  BILITOT 0.3  PROT 6.2  ALBUMIN 2.1*    Recent Labs Lab 11/19/14 0302  LIPASE 28    Recent Labs Lab 11/21/14 2108  AMMONIA 33*   CBC:  Recent Labs Lab 11/18/14 2324  11/20/14 1645 11/21/14 0516 11/22/14 0530 11/23/14 0741 11/24/14 0600  WBC 5.6  < > 3.3* 3.7* 8.4 9.3 9.8  NEUTROABS 4.5  --   --   --   --   --   --   HGB 5.4*  < > 7.9* 7.6* 7.6* 7.4* 7.5*  HCT 16.9*  < > 23.6* 22.9* 23.1* 22.4* 22.9*  MCV 83.3  < > 85.5 83.0 83.7 84.2 83.9  PLT 245  < > 216 221 231 253 259  < > = values in this interval not displayed. Cardiac Enzymes:  Recent Labs Lab 11/19/14 0018 11/19/14 0302  CKTOTAL 36  --   TROPONINI  --  <0.03   BNP: Invalid input(s): POCBNP CBG:  Recent Labs Lab 11/23/14 0750 11/23/14 2319 11/24/14 0853 11/24/14 1206 11/24/14 1705  GLUCAP 242* 429* 282* 404* 450*    Recent Results (from the past 240 hour(s))  MRSA PCR Screening     Status: None   Collection Time: 11/19/14  5:30 AM  Result Value Ref Range Status   MRSA by PCR NEGATIVE NEGATIVE Final    Comment:        The GeneXpert MRSA Assay (FDA approved for NASAL specimens only), is one component of a comprehensive MRSA colonization surveillance program. It is not intended to diagnose MRSA infection nor to guide or monitor treatment for MRSA infections.   Culture, blood (routine x 2)     Status: None (Preliminary result)   Collection Time: 11/19/14  6:00 AM  Result Value Ref Range Status    Specimen Description BLOOD LEFT ARM  Final   Special Requests BOTTLES DRAWN AEROBIC AND ANAEROBIC 5CC  Final   Culture   Final           BLOOD CULTURE RECEIVED NO GROWTH TO DATE CULTURE WILL BE HELD FOR 5 DAYS BEFORE ISSUING A FINAL NEGATIVE REPORT Performed at [facility]    Report Status PENDING  Incomplete  Culture, blood (routine x 2)     Status: None (Preliminary result)   Collection Time: 11/19/14  6:54 AM  Result Value Ref Range Status   Specimen Description BLOOD RIGHT ARM  Final   Special Requests BOTTLES  DRAWN AEROBIC AND ANAEROBIC 10CC  Final   Culture   Final           BLOOD CULTURE RECEIVED NO GROWTH TO DATE CULTURE WILL BE HELD FOR 5 DAYS BEFORE ISSUING A FINAL NEGATIVE REPORT Performed at [facility]    Report Status PENDING  Incomplete  Culture, blood (routine x 2)     Status: None (Preliminary result)   Collection Time: 11/21/14  2:05 PM  Result Value Ref Range Status   Specimen Description BLOOD RIGHT ANTECUBITAL  Final   Special Requests BOTTLES DRAWN AEROBIC AND ANAEROBIC 10CC  Final   Culture   Final           BLOOD CULTURE RECEIVED NO GROWTH TO DATE CULTURE WILL BE HELD FOR 5 DAYS BEFORE ISSUING A FINAL NEGATIVE REPORT Performed at [facility]    Report Status PENDING  Incomplete  Culture, blood (routine x 2)     Status: None (Preliminary result)   Collection Time: 11/21/14  2:20 PM  Result Value Ref Range Status   Specimen Description BLOOD RIGHT FOREARM  Final   Special Requests BOTTLES DRAWN AEROBIC AND ANAEROBIC 10CC  Final   Culture   Final           BLOOD CULTURE RECEIVED NO GROWTH TO DATE CULTURE WILL BE HELD FOR 5 DAYS BEFORE ISSUING A FINAL NEGATIVE REPORT Performed at [facility]    Report Status PENDING  Incomplete     Scheduled Meds: . amLODipine  10 mg Oral Daily  . atorvastatin  20 mg Oral q1800  . cloNIDine  0.3 mg Oral TID  . clotrimazole  10 mg Oral 5 X Daily  . diclofenac sodium  2 g Topical QID    . docusate sodium  100 mg Oral BID  . feeding supplement (RESOURCE BREEZE)  1 Container Oral BID BM  . ferrous sulfate  325 mg Oral Q breakfast  . gabapentin  100 mg Oral QHS  . hydroxychloroquine  400 mg Oral Daily  . [START ON 11/25/2014] insulin aspart  0-15 Units Subcutaneous TID WC  . insulin aspart  0-5 Units Subcutaneous QHS  . [START ON 11/25/2014] insulin aspart  4 Units Subcutaneous TID WC  . insulin glargine  15 Units Subcutaneous QHS  . isosorbide mononitrate  60 mg Oral Daily  . labetalol  600 mg Oral BID  . multivitamin  1 tablet Oral Daily  . MUSCLE RUB  1 application Topical TID AC  . mycophenolate  1,000 mg Oral BID  . pantoprazole  40 mg Oral Q0600  . piperacillin-tazobactam (ZOSYN)  IV  2.25 g Intravenous 4 times per day  . [START ON 11/25/2014] predniSONE  30 mg Oral Q breakfast  . sodium bicarbonate  1,300 mg Oral BID  . sodium chloride  3 mL Intravenous Q12H   Continuous Infusions:    Bronte Kropf, DO  [facility] Pager [phone]  If 7PM-7AM, please contact night-coverage [email or web address] Password [email or web address] 11/24/2014, 6:11 PM   LOS: 6 days

## 2014-11-24 NOTE — Progress Notes (Signed)
Physical Therapy Treatment Patient Details Name: [name] [name] MRN: [id number] DOB: [birth year] Today's Date: 11/24/2014    History of Present Illness 54 y.o. female admitted to [facility] on 11/18/14 for abdominal pain, diarrhea, and black stool.  GI workup in progress.  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      PT Comments    Pt improved distance with ambulation today; had to stop twice due to "burning" in feet.  Pt reports this is chronic.  Follow Up Recommendations  Home health PT     Equipment Recommendations  None recommended by PT    Recommendations for Other Services       Precautions / Restrictions Precautions Precautions: Fall Precaution Comments: h/o ICH with Lt sided weakness  Restrictions Weight Bearing Restrictions: No    Mobility  Bed Mobility Overal bed mobility: Independent             General bed mobility comments:  (bed flat; no rail)  Transfers Overall transfer level: Needs assistance Equipment used: Rolling walker (2 wheeled) Transfers: Sit to/from Stand Sit to Stand: Supervision         General transfer comment: pt demonstrated safe technique today  Ambulation/Gait Ambulation/Gait assistance: Min guard Ambulation Distance (Feet): 350 Feet Assistive device: Rolling walker (2 wheeled) Gait Pattern/deviations: Step-through pattern (L knee recurvatum)         Stairs            Wheelchair Mobility    Modified Rankin (Stroke Patients Only)       Balance Overall balance assessment: Needs assistance Sitting-balance support: No upper extremity supported;Feet supported Sitting  balance-Leahy Scale: Good     Standing balance support: Bilateral upper extremity supported Standing balance-Leahy Scale: Fair                      Cognition Arousal/Alertness: Awake/alert Behavior During Therapy: WFL for tasks assessed/performed;Flat affect Overall Cognitive Status: Within Functional Limits for tasks assessed                      Exercises      General Comments        Pertinent Vitals/Pain Pain Assessment: No/denies pain    Home Living                      Prior Function            PT Goals (current goals can now be found in the care plan section) Acute Rehab PT Goals Patient Stated Goal: to return home  PT Goal Formulation: With patient Time For Goal Achievement: 12/04/14 Potential to Achieve Goals: Good Progress towards PT goals: Progressing toward goals    Frequency  Min 3X/week    PT Plan Current plan remains appropriate    Co-evaluation             End of Session Equipment Utilized During Treatment: Gait belt Activity Tolerance: Patient tolerated treatment well Patient left: in bed;with call bell/phone within reach     Time: 0835-0855 PT Time Calculation (min) (ACUTE ONLY): 20 min  Charges:  $Gait Training: 8-22 mins  G Codes:       Laureen Abrahams, PT, DPT 11/24/2014 9:12 AM [phone]

## 2014-11-25 LAB — CULTURE, BLOOD (ROUTINE X 2)
Culture: NO GROWTH
Culture: NO GROWTH

## 2014-11-25 LAB — GLUCOSE, CAPILLARY: GLUCOSE-CAPILLARY: 176 mg/dL — AB (ref 70–99)

## 2014-11-25 MED ORDER — LABETALOL HCL 300 MG PO TABS: 600.0000 mg | ORAL_TABLET | Freq: Two times a day (BID) | ORAL | Status: DC

## 2014-11-25 MED ORDER — CLOTRIMAZOLE 10 MG MT TROC: 10.0000 mg | Freq: Every day | OROMUCOSAL | Status: DC

## 2014-11-25 MED ORDER — PREDNISONE 10 MG PO TABS: 30.0000 mg | ORAL_TABLET | Freq: Every day | ORAL | Status: DC

## 2014-11-25 MED ORDER — GABAPENTIN 100 MG PO CAPS: 100.0000 mg | ORAL_CAPSULE | Freq: Every day | ORAL | Status: DC

## 2014-11-25 MED ORDER — AMOXICILLIN-POT CLAVULANATE 500-125 MG PO TABS: 1.0000 | ORAL_TABLET | Freq: Two times a day (BID) | ORAL | Status: DC

## 2014-11-25 NOTE — Discharge Summary (Signed)
Physician Discharge Summary  [name] [name] DGU:[id number] DOB: 16-Oct-1961 DOA: 11/18/2014  PCP: No primary care provider on file.  Admit date: 11/18/2014 Discharge date: 11/25/2014  Recommendations for Outpatient Follow-up:  1. Pt will need to follow up with PCP in 2 weeks post discharge 2. Please obtain BMP and CBC in one week  Discharge Diagnoses:  Lower GIB (gastrointestinal bleeding): -suspected secondary to Ischemic Colitis although CT of the abdomen did not show any evidence of colitis.  -Transfused 2 units of PRBC, posttransfusion hemoglobin remained stable. -Gastroenterology was consulted, no plans for endoscopic procedures at this time. Fever -HAP vs RA/MCTD flare -Urinalysis negative for pyuria -11/21/2014 -Blood cultures 2 sets--pending -11/22/2014 Obtain chest x-ray--new patchy basilar infiltrates -Check uric acid--7.0 -C3, C4--pending  -CH 50 level--52 -dsDNA< 1 but ANA >1:2560 -ESR--124, CRP--12.9 -improved with steroids--home with prednisone 30mg  po daily x 3 more days which will complete one week of therapy HAP -continue Zosyn -1/17 and 1/19 blood cultures neg -Blood cultures negative -Discharge home with Augmentin 500mg /125 bid x 4 days to complete 7 days of therapy Suspected ischemic Colitis:  -Given history of being on immunosuppressive, and loose stools, was empirically started on Cipro/Flagyl. -Patient received 3 days of Cipro and Flagyl after which it was discontinued -CT of the abdomen did not show any major abnormalities. No further diarrhea -Patient did not have any further diarrhea or abdominal pain -Her diet was advanced which she tolerated constipation  -Started cathartics   Acute Blood Loss Anemia:has baseline chronic anemia secondary to CKD(8.6 on 11/06/14)., acute anemia is secondary to blood loss from colitis. Transfused 2 units of PRBC on admission, hemoglobin currently up to 7.5. -Hemoglobin remained stable after transfusion-    Hyponatremia: -Improved. Suspect this was secondary to dehydration.   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 Continue Cellcept.   Hx of MCTD/RA:continue Cellcept and Plaquenil. -pt missed appt with Dr. Dierdre Forth on 11/22/13 due to hospitalization--will need to be rescheduled -continue prednisone for now due to arthritis flare--plan to wean at d/c -will need ophth eval after d/c -was noncompliant with meds and Rheum follow--had not seen prior Rheumatologist, Dr. 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 -Was thought to be due to a hypertensive bleed  -Optimize blood pressure control  Tremor -improving but not gone with decreased gabapentin dose -Ammonia--33 -serum B12, TSH normal -May be due to the patient's gabapentin--patient unwilling to discontinue medication at this time, but is willing to decrease dose  -appreciate neurology consult  Hyperglycemia -Secondary to steroids -10/12/2014 hemoglobin A1c 5.2 -Increased Lantus 12 units at bedtime while the patient in hospital -NovoLog sliding scale -novolog 4 units with meals -change to carb-modified diet Severe protein calorie malnutrition -Continue nutritional supplement  Discharge Condition: stable  Disposition: home Follow-up Information    Follow up with [facility].   Why:  Home Health Physical Therapy, Occupational Therapy, and RN   Contact information:   [address] [place] [address] [phone]       Diet:carb  modified Wt Readings from Last 3 Encounters:  11/25/14 64.5 kg (142 lb 3.2  oz)  11/10/14 59.421 kg (131 lb)  11/06/14 67.586 kg (149 lb)    History of present illness:  54 y.o. female with past medical history of hypertension, lupus (on plaquenil and CellCept), chronic kidney disease-IV, recent history of intracranial hemorrhage, memberanous glomerulonephritis, GERD, history of adrenal insufficiency, who presents with abdominal pain, diarrhea and black stool.  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 She went home, and currently is doing physical therapy at home. She has been doing fine until 4-5 days ago when she started having abdominal pain and diarrhea with black and watery stools.  Denies recent antibiotic use. She has right side of the weakness from previous stroke which has not worsened.  Work up in the ED demonstrates a decreased hemoglobin from 8.6 on 11/06/14 to 5.4 on this admission. FOBT is positive. X-ray of abdomen showed possible enteritis or colitis. Patient has mild fever with temperature 99.9. She has tachycardia with heart rate at about 120/minute. Patient is admitted to inpatient for further evaluation and treatment. GI was consulted.  They felt that the patient likely had a component of ischemic colitis. They did not feel that endoscopy was necessary. The patient stabilized after transfusion with 2 units PRBC. She did not have any further drops in her hemoglobin. She remained hemodynamically stable. In fact, the patient remained hypertensive. Her labetalol dose was increased. During the hospitalization, the patient developed a fever and bibasilar infiltrates. It was thought that this may have been HCAP vs flare of her MCTD. The patient was started on intravenous antibiotics as well as po prednisone. The patient clinically improved. She'll be sent home with 4 more days of Augmentin and 3 more days of prednisone 30 mg daily. The patient  developed thrush and will be sent home with Mycelex troches    Consultants: [facility]  Discharge Exam: Filed Vitals:   11/25/14 0639  BP: 136/87  Pulse: 87  Temp:   Resp:    Filed Vitals:   11/24/14 2207 11/25/14 0500 11/25/14 0509 11/25/14 0639  BP: 167/84  172/80 136/87  Pulse: 89  90 87  Temp: 97.3 F (36.3 C)  97.6 F (36.4 C)   TempSrc: Axillary  Oral   Resp: 14     Height:      Weight:  64.5 kg (142 lb 3.2 oz)    SpO2: 100%  100%    General: A&O x 3, NAD, pleasant, cooperative Cardiovascular: RRR, no rub, no gallop, no S3 Respiratory: Bibasilar crackles, right greater than left. No wheezing.  Abdomen:soft, nontender, nondistended, positive bowel sounds Extremities: No edema, No lymphangitis, no petechiae  Discharge Instructions      Discharge Instructions    Diet - low sodium heart healthy    Complete by:  As directed      Increase activity slowly    Complete by:  As directed             Medication List    STOP taking these medications        furosemide 80 MG tablet  Commonly known as:  LASIX     hydrALAZINE 25 MG tablet  Commonly known as:  APRESOLINE     magic mouthwash w/lidocaine Soln      TAKE these medications        acetaminophen 325 MG tablet  Commonly known as:  TYLENOL  Take 2 tablets (650 mg total) by mouth every 4 (four) hours as needed for mild pain.  ALPRAZolam 0.5 MG tablet  Commonly known as:  XANAX  Take 1 tablet (0.5 mg total) by mouth 2 (two) times daily as needed for anxiety.     amLODipine 10 MG tablet  Commonly known as:  NORVASC  Take 1 tablet (10 mg total) by mouth daily.     amoxicillin-clavulanate 500-125 MG per tablet  Commonly known as:  AUGMENTIN  Take 1 tablet (500 mg total) by mouth 2 (two) times daily.     atorvastatin 20 MG tablet  Commonly known as:  LIPITOR  Take 1 tablet (20 mg total) by mouth daily at 6 PM.     cloNIDine 0.3 MG tablet  Commonly known as:  CATAPRES  Take 1  tablet (0.3 mg total) by mouth 3 (three) times daily.     clotrimazole 10 MG troche  Commonly known as:  MYCELEX  Take 1 tablet (10 mg total) by mouth 5 (five) times daily.     diclofenac sodium 1 % Gel  Commonly known as:  VOLTAREN  Apply 2 g topically 4 (four) times daily. To forefoot     ferrous sulfate 325 (65 FE) MG tablet  Take 1 tablet (325 mg total) by mouth daily with breakfast.     gabapentin 100 MG capsule  Commonly known as:  NEURONTIN  Take 1 capsule (100 mg total) by mouth at bedtime.     hydroxychloroquine 200 MG tablet  Commonly known as:  PLAQUENIL  Take 2 tablets (400 mg total) by mouth daily.     isosorbide mononitrate 60 MG 24 hr tablet  Commonly known as:  IMDUR  Take 1 tablet (60 mg total) by mouth daily.     labetalol 300 MG tablet  Commonly known as:  NORMODYNE  Take 2 tablets (600 mg total) by mouth 2 (two) times daily.     multivitamin Tabs tablet  Take 1 tablet by mouth daily.     MUSCLE RUB 10-15 % Crea  Apply 1 application topically 3 (three) times daily before meals.     mycophenolate 250 MG capsule  Commonly known as:  CELLCEPT  Take 4 capsules (1,000 mg total) by mouth 2 (two) times daily.     nicotine 14 mg/24hr patch  Commonly known as:  NICODERM CQ - dosed in mg/24 hours  Place 1 patch (14 mg total) onto the skin daily.     nystatin-triamcinolone ointment  Commonly known as:  MYCOLOG  Apply 1 application topically 2 (two) times daily.     omeprazole 20 MG capsule  Commonly known as:  PRILOSEC  Take 1 capsule (20 mg total) by mouth daily.     pantoprazole 40 MG tablet  Commonly known as:  PROTONIX  Take 1 tablet (40 mg total) by mouth 2 (two) times daily.     predniSONE 10 MG tablet  Commonly known as:  DELTASONE  Take 3 tablets (30 mg total) by mouth daily with breakfast. Start 11/26/14  Start taking on:  11/26/2014     senna-docusate 8.6-50 MG per tablet  Commonly known as:  Senokot-S  Take 1 tablet by mouth 2 (two)  times daily.     sodium bicarbonate 650 MG tablet  Take 2 tablets (1,300 mg total) by mouth 2 (two) times daily.     Vitamin D (Ergocalciferol) 50000 UNITS Caps capsule  Commonly known as:  DRISDOL  Take 1 capsule (50,000 Units total) by mouth every Wednesday.         The results of significant diagnostics  from this hospitalization (including imaging, microbiology, ancillary and laboratory) are listed below for reference.    Significant Diagnostic Studies: Ct Abdomen Pelvis Wo Contrast  11/19/2014   CLINICAL DATA:  54 y.o. female with past medical history of hypertension, lupus (on plaquenil and CellCept), chronic kidney disease-IV, recent history of intracranial hemorrhage, memberanous glomerulonephritis, GERD, history of adrenal insufficiency, who presents with abdominal pain, diarrhea and black stool. Initial encounter.  EXAM: CT ABDOMEN AND PELVIS WITHOUT CONTRAST  TECHNIQUE: Multidetector CT imaging of the abdomen and pelvis was performed following the standard protocol without IV contrast.  COMPARISON:  CT, 09/12/2009.  FINDINGS: Small effusions. The heart mildly enlarged. The linear/reticular lung base opacity is noted dependently consistent with subsegmental atelectasis. No convincing pulmonary edema.  Liver, spleen, gallbladder, pancreas: Unremarkable. No bile duct dilation. No adrenal masses.  2.3 cm low-density left midpole renal mass consistent with a cyst, enlarged from the prior exam. No other renal masses, no stones and no hydronephrosis. Ureters normal course and in caliber. Bladder is unremarkable.  No pathologically enlarged lymph nodes.  Small amount of reach fluid in the posterior pelvic recess.  No colonic wall thickening or mesenteric inflammation. No evidence of obstruction. No bowel dilation. Normal small bowel. Normal appendix.  No osteoblastic or osteolytic lesions.  IMPRESSION: 1. No acute findings in the abdomen pelvis. 2. No bowel inflammatory change. No findings to  explain patient's abdominal pain or diarrhea or black stools. 3. Small amount of free fluid in the pelvis, nonspecific in this patient with a history chronic kidney disease. Small pleural effusions. Convincing pulmonary edema.   Electronically Signed   By: Amie Portland M.D.   On: 11/19/2014 21:59   Dg Chest 2 View  11/22/2014   CLINICAL DATA:  Initial evaluation for fever  EXAM: CHEST  2 VIEW  COMPARISON:  11/06/2014  FINDINGS: Stable mild cardiac enlargement. Vascular pattern is normal. Mild patchy bibasilar infiltrates superior this is new from the prior study. Minimal blunting right costophrenic angle could indicate a tiny effusion.  IMPRESSION: New bibasilar patchy infiltrates concerning for possible pneumonia. Tiny right pleural effusion possible.   Electronically Signed   By: Esperanza Heir M.D.   On: 11/22/2014 08:28   Dg Chest 2 View  11/06/2014   CLINICAL DATA:  Initial evaluation for dysphagia and chest pain, shortness of breath, personal history of lupus and stroke, patient smokes  EXAM: CHEST  2 VIEW  COMPARISON:  10/10/2014  FINDINGS: Mild cardiac enlargement. Vascular pattern is normal. No consolidation or effusion. No change from prior study.  IMPRESSION: Stable cardiac enlargement with no acute findings   Electronically Signed   By: Esperanza Heir M.D.   On: 11/06/2014 14:29   Dg Abd Acute W/chest  11/19/2014   CLINICAL DATA:  Diarrhea for 2 weeks.  EXAM: ACUTE ABDOMEN SERIES (ABDOMEN 2 VIEW & CHEST 1 VIEW)  COMPARISON:  Chest radiograph 11/06/2014  FINDINGS: Mild cardiomegaly is stable. The lungs are clear. There is no free intra-abdominal air. No dilated bowel loops to suggest obstruction. There is questionable colonic wall thickening involving the rectosigmoid colon. No radiopaque calculi. No acute osseous abnormalities are seen.  IMPRESSION: 1. Nonobstructive bowel gas pattern. No free air. There is questionable wall thickening of the sigmoid colon, may reflect enteritis/colitis. 2.  Stable mild cardiomegaly.   Electronically Signed   By: Rubye Oaks M.D.   On: 11/19/2014 02:16     Microbiology: Recent Results (from the past 240 hour(s))  MRSA PCR Screening  Status: None   Collection Time: 11/19/14  5:30 AM  Result Value Ref Range Status   MRSA by PCR NEGATIVE NEGATIVE Final    Comment:        The GeneXpert MRSA Assay (FDA approved for NASAL specimens only), is one component of a comprehensive MRSA colonization surveillance program. It is not intended to diagnose MRSA infection nor to guide or monitor treatment for MRSA infections.   Culture, blood (routine x 2)     Status: None (Preliminary result)   Collection Time: 11/19/14  6:00 AM  Result Value Ref Range Status   Specimen Description BLOOD LEFT ARM  Final   Special Requests BOTTLES DRAWN AEROBIC AND ANAEROBIC 5CC  Final   Culture   Final           BLOOD CULTURE RECEIVED NO GROWTH TO DATE CULTURE WILL BE HELD FOR 5 DAYS BEFORE ISSUING A FINAL NEGATIVE REPORT Performed at [facility]    Report Status PENDING  Incomplete  Culture, blood (routine x 2)     Status: None (Preliminary result)   Collection Time: 11/19/14  6:54 AM  Result Value Ref Range Status   Specimen Description BLOOD RIGHT ARM  Final   Special Requests BOTTLES DRAWN AEROBIC AND ANAEROBIC 10CC  Final   Culture   Final           BLOOD CULTURE RECEIVED NO GROWTH TO DATE CULTURE WILL BE HELD FOR 5 DAYS BEFORE ISSUING A FINAL NEGATIVE REPORT Performed at [facility]    Report Status PENDING  Incomplete  Culture, blood (routine x 2)     Status: None (Preliminary result)   Collection Time: 11/21/14  2:05 PM  Result Value Ref Range Status   Specimen Description BLOOD RIGHT ANTECUBITAL  Final   Special Requests BOTTLES DRAWN AEROBIC AND ANAEROBIC 10CC  Final   Culture   Final           BLOOD CULTURE RECEIVED NO GROWTH TO DATE CULTURE WILL BE HELD FOR 5 DAYS BEFORE ISSUING A FINAL NEGATIVE REPORT Performed at  [facility]    Report Status PENDING  Incomplete  Culture, blood (routine x 2)     Status: None (Preliminary result)   Collection Time: 11/21/14  2:20 PM  Result Value Ref Range Status   Specimen Description BLOOD RIGHT FOREARM  Final   Special Requests BOTTLES DRAWN AEROBIC AND ANAEROBIC 10CC  Final   Culture   Final           BLOOD CULTURE RECEIVED NO GROWTH TO DATE CULTURE WILL BE HELD FOR 5 DAYS BEFORE ISSUING A FINAL NEGATIVE REPORT Performed at [facility]    Report Status PENDING  Incomplete     Labs: Basic Metabolic Panel:  Recent Labs Lab 11/20/14 0510 11/21/14 0516 11/22/14 0530 11/22/14 2046 11/23/14 0741 11/24/14 0600  NA 134* 135 134*  --  132* 133*  K 3.7 3.8 3.7  --  4.0 4.0  CL 111 108 105  --  102 100  CO2 18* 16* 17*  --  19 20  GLUCOSE 104* 111* 105*  --  274* 387*  BUN 43* 37* 34*  --  42* 47*  CREATININE 2.62* 2.71* 2.95*  --  2.87* 2.99*  CALCIUM 8.1* 7.8* 7.8*  --  8.0* 8.6  MG  --   --   --  1.0*  --  2.1   Liver Function Tests:  Recent Labs Lab 11/18/14 2324  AST 18  ALT 8  ALKPHOS 77  BILITOT 0.3  PROT 6.2  ALBUMIN 2.1*    Recent Labs Lab 11/19/14 0302  LIPASE 28    Recent Labs Lab 11/21/14 2108  AMMONIA 33*   CBC:  Recent Labs Lab 11/18/14 2324  11/20/14 1645 11/21/14 0516 11/22/14 0530 11/23/14 0741 11/24/14 0600  WBC 5.6  < > 3.3* 3.7* 8.4 9.3 9.8  NEUTROABS 4.5  --   --   --   --   --   --   HGB 5.4*  < > 7.9* 7.6* 7.6* 7.4* 7.5*  HCT 16.9*  < > 23.6* 22.9* 23.1* 22.4* 22.9*  MCV 83.3  < > 85.5 83.0 83.7 84.2 83.9  PLT 245  < > 216 221 231 253 259  < > = values in this interval not displayed. Cardiac Enzymes:  Recent Labs Lab 11/19/14 0018 11/19/14 0302  CKTOTAL 36  --   TROPONINI  --  <0.03   BNP: Invalid input(s): POCBNP CBG:  Recent Labs Lab 11/24/14 1206 11/24/14 1705 11/24/14 2041 11/24/14 2209 11/25/14 0848  GLUCAP 404* 450* 381* 327* 176*    Time coordinating  discharge:  Greater than 30 minutes  Signed:  Brailynn Breth, DO [facility] Pager: [phone] 11/25/2014, 9:18 AM

## 2014-11-25 NOTE — Progress Notes (Signed)
Patient reviewed with patient and patient verbalized understanding.  Discharge paper and belonging given.

## 2014-11-27 LAB — CULTURE, BLOOD (ROUTINE X 2)
CULTURE: NO GROWTH
Culture: NO GROWTH

## 2014-11-27 LAB — C4 COMPLEMENT: Complement C4, Body Fluid: 102 mg/dL (ref 16–47)

## 2014-11-27 LAB — C3 COMPLEMENT: C3 Complement: 29 mg/dL — ABNORMAL LOW (ref 88–201)

## 2014-11-28 ENCOUNTER — Encounter: Payer: Managed Care, Other (non HMO) | Attending: Physical Medicine & Rehabilitation

## 2014-11-28 ENCOUNTER — Ambulatory Visit (HOSPITAL_BASED_OUTPATIENT_CLINIC_OR_DEPARTMENT_OTHER): Payer: Managed Care, Other (non HMO) | Admitting: Physical Medicine & Rehabilitation

## 2014-11-28 ENCOUNTER — Encounter: Payer: Self-pay | Admitting: Physical Medicine & Rehabilitation

## 2014-11-28 VITALS — BP 168/83 | HR 85 | Resp 14

## 2014-11-28 DIAGNOSIS — K529 Noninfective gastroenteritis and colitis, unspecified: Secondary | ICD-10-CM | POA: Diagnosis not present

## 2014-11-28 DIAGNOSIS — D62 Acute posthemorrhagic anemia: Secondary | ICD-10-CM | POA: Insufficient documentation

## 2014-11-28 DIAGNOSIS — I613 Nontraumatic intracerebral hemorrhage in brain stem: Secondary | ICD-10-CM | POA: Insufficient documentation

## 2014-11-28 NOTE — Patient Instructions (Signed)
Very important to follow-up with the gastroenterologist. This is a specialist that works on the stomach and intestines and can evaluate and treat the gastrointestinal bleeding that you have.

## 2014-11-28 NOTE — Progress Notes (Signed)
Subjective:    Patient ID: [name] [name], female    DOB: 09/17/1961, 54 y.o.   MRN: [id number] 54 y.o. female with history of HTN, hyperlipidemia, lupus with mild LLE weakness as well as compliance issues who was admitted on 09/28/14 with acute onset left sided weakness and numbness as well as gaze to the right side. Patient reported being out of BP meds for 3 weeks and BP 238/88 at admission. UDS negative.  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  Carotid dopplers significant for R-40-59% ICA stenosis.  She was treated with IV diuresis for SOB due to fluid overload.  Lisinopril was dicontinued due to worsening of renal status and she was treated with IV bicarb for acidosis. Repeat cardiac echo showed EF 60-70% with AV sclerosis without stenosis.  FEES done yesterday showing mild dysphagia with pentration due to large boluses but no aspiration therefore she was started on regular textures with thin liquids. She continued to have complaints of neck, back and ankle pain as well as issues with fluctuating bouts of lethargy.  Follow up MRI/MRA brain done on 12/03 revealing changes c/w acute/subacute hemorrhage in right parmedian pons and no underlying mass evident. Minimal diffusion abnormality in high frontal lobes bilaterally likely due to calcification and probably meningioma.   Admit date: 10/17/2014 Discharge date: 10/25/2014  HPI Hospitalized for GI bleed secondary to colitis, Discharged about 3 days ago. She was asking what follow-up she needed on this. Reviewed discharge summary from hospitalist. Recommendations were for GI follow-up. No appointment time was given however.  Patient has had no falls. Dressing and bathing independently. Uses a walker for ambulation as well as a right knee cage  Has not followed up with gastroenterologist yet.  Receiving home health  therapy PT and OT, no speech therapy Pain Inventory Average Pain 0 Pain Right Now 0 My pain is no pain  In the last 24 hours, has pain interfered with the following? General activity 0 Relation with others 0 Enjoyment of life 0 What TIME of day is your pain at its worst? no pain Sleep (in general) no pain  Pain is worse with: no pain Pain improves with: no pain Relief from Meds: no pain  Mobility use a walker ability to climb steps?  yes do you drive?  no  Function not employed: date last employed . disabled: date disabled .  Neuro/Psych bladder control problems bowel control problems weakness numbness tingling trouble walking dizziness confusion depression anxiety loss of taste or smell  Prior Studies Any changes since last visit?  no  Physicians involved in your care Any changes since last visit?  no   Family History  Problem Relation Age of Onset  . Hypertension    . Lupus    . Rheum arthritis    . Hypertension Mother   . Diabetes Mother   . Hypertension Sister   . Diabetes Father   . Hypertension Maternal Grandmother    History   Social History  . Marital Status: Single    Spouse Name: N/A    Number of Children: N/A  . Years of Education: N/A   Social History Main Topics  . Smoking status: Former Smoker -- 0.50 packs/day for 30 years    Types: Cigarettes  . Smokeless tobacco: Never Used  . Alcohol Use: No  . Drug Use: No  . Sexual Activity: None  Other Topics Concern  . None   Social History Narrative   Past Surgical History  Procedure Laterality Date  . Breast biopsy     Past Medical History  Diagnosis Date  . Hypertension   . Lupus   . CKD (chronic kidney disease)     due to lupus/Dr. [name]  . Diabetes mellitus   . Stenosis of cervical spine region     with HNP at C5/6, C6/7  . Metatarsal bone fracture right    4th  . 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 Arthritis      RA  . Anemia   . Stroke 11/2014    left sided weakness, dysphagia  . Pain in joints   . Paresthesias    BP 168/83 mmHg  Pulse 85  Resp 14  SpO2 99%  LMP 07/18/2012  Opioid Risk Score:   Fall Risk Score: High Fall Risk (>13 points) (educated and given handout/  PT still in home)  Review of Systems  Constitutional: Positive for appetite change.       Loss of taste or smell  Respiratory: Positive for shortness of breath.   Gastrointestinal: Positive for diarrhea.       Bowel Control Problems  Genitourinary:       Bladder control problems  Musculoskeletal: Positive for gait problem.  Skin: Positive for rash.  Neurological: Positive for dizziness, weakness and numbness.       Tingling  Psychiatric/Behavioral: Positive for confusion and dysphoric mood. The patient is nervous/anxious.   All other systems reviewed and are negative.      Objective:   Physical Exam  Constitutional: She is oriented to person, place, and time. She appears well-developed and well-nourished.  HENT:  Head: Normocephalic and atraumatic.  Eyes: Conjunctivae and EOM are normal. Pupils are equal, round, and reactive to light.  Neck: Normal range of motion.  Neurological: She is alert and oriented to person, place, and time.  Motor strength is 5/5 bilateral deltoids, biceps, triceps, grip 4/5 left hip flexor and knee extensor and 3 minus at the ankle dorsiflexor 5/5 right lower extremity Sensation without deficit but does have paresthesias and tingling in the hand and foot.  Psychiatric: She has a normal mood and affect.  Nursing note and vitals reviewed.         Assessment & Plan:  1. 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 She requires continued physical therapy because of left foot drop and knee hyperextension. I would recommend that she wears her knee brace on a consistent basis to prevent knee pain related to genuine recurvatum.  Once  she completes home health therapy will need to reevaluate. She may require outpatient therapy if she is not back to walking without assistive device. In addition we discussed recommendation for no working at the current time. She may not be able to resume her previous job which required a lot of walking.  Return to clinic one month  2. Lower GI bleed with hospitalization and transfusion. Have made referral to gastroenterology for follow-up as the patient did not seem to know who to see in regards to this problem.

## 2014-12-01 ENCOUNTER — Telehealth: Payer: Self-pay | Admitting: *Deleted

## 2014-12-01 NOTE — Telephone Encounter (Signed)
Pt seen by Dr. Letta Pate on jan 26th, nurse case manager says pt was given a referral to be seen by a GI doctor. However pt says she does not know who the Doctor is and has not heard anything yet about scheduling an appt.  I returned a call to [facility] and informed the case manager that the doctors name is Erskine Emery with [facility]. I also called the patient and informed her of the Doctors name and the location of his practice. I gave her the phone number and advised her to call to check on the status

## 2014-12-06 ENCOUNTER — Ambulatory Visit: Payer: Managed Care, Other (non HMO) | Admitting: Gastroenterology

## 2014-12-06 ENCOUNTER — Telehealth: Payer: Self-pay | Admitting: *Deleted

## 2014-12-06 ENCOUNTER — Encounter: Payer: Self-pay | Admitting: *Deleted

## 2014-12-06 NOTE — Telephone Encounter (Signed)
Diane called and left message that patient does not want any therapy while she is having issues with diarrhea. She will be ready for PT once that problem is cleared up. Patient doesn't feel strong enough to do it.

## 2014-12-07 ENCOUNTER — Encounter: Payer: Self-pay | Admitting: Gastroenterology

## 2014-12-07 ENCOUNTER — Ambulatory Visit (INDEPENDENT_AMBULATORY_CARE_PROVIDER_SITE_OTHER): Payer: Managed Care, Other (non HMO) | Admitting: Gastroenterology

## 2014-12-07 VITALS — BP 124/64 | HR 76 | Ht 65.0 in | Wt 127.0 lb

## 2014-12-07 DIAGNOSIS — R197 Diarrhea, unspecified: Secondary | ICD-10-CM | POA: Insufficient documentation

## 2014-12-07 DIAGNOSIS — K649 Unspecified hemorrhoids: Secondary | ICD-10-CM | POA: Insufficient documentation

## 2014-12-07 DIAGNOSIS — D509 Iron deficiency anemia, unspecified: Secondary | ICD-10-CM

## 2014-12-07 MED ORDER — PANTOPRAZOLE SODIUM 40 MG PO TBEC: 40.0000 mg | DELAYED_RELEASE_TABLET | Freq: Every day | ORAL | Status: DC

## 2014-12-07 MED ORDER — HYDROCORTISONE ACE-PRAMOXINE 1-1 % RE CREA: 1.0000 "application " | TOPICAL_CREAM | Freq: Two times a day (BID) | RECTAL | Status: DC

## 2014-12-07 NOTE — Progress Notes (Signed)
12/07/2014 [name] [name] [id number] 1961/03/06   History of Present Illness:  This is a 54 year old female with multiple medical problems who was recently admitted to the hospital with complaints of diarrhea, abdominal discomfort, and black stools that was thought to be some type of acute colitis, either infectious or ischemic, however, CT scan W/O contrast did not demonstrate any issues.  Was treated expectantly and given some cipro and flagyl; symptoms subsided quickly and diet was advanced.  She is here today for follow-up.  She has history of lupus and is maintained on Plaquenil and CellCept. 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 She has adult onset diabetes mellitus, hyperlipidemia, peripheral neuropathy, rheumatoid arthritis, adrenal insufficiency, and had a recent intracranial hemorrhage in December 2015.  Patient had seen Dr. Ardis Hughs remotely in 2007 for colonoscopy at which time she was found to have internal and external hemorrhoids as well as a hyperplastic colon polyp.  Also had EGD at that time that was normal; duodenal biopsies were normal as well.  This evaluation was done for IDA at that time.  She says that her abdominal pain has not returned.  She does have some diarrhea again after eating and says that it is still very dark/black (says that they have been that way for a while).  She is on iron supplements and has been for a while I believe.  She has chronic anemia, but MCV is normal and iron studies did not demonstrate iron deficiency.  Hgb during recent admission was acutely low at 5.4 grams and she was transfused with 2 units PRBC's.  It has now been stable in the 7 gram range.  She was heme positive on one occasion during her hospitalization.  As stated above, she was treated with cipro and flagyl in the hospital then was on Augmentin, which was just completely recently as well.     Current Medications, Allergies, Past Medical History, Past  Surgical History, Family History and Social History were reviewed in [employer] record.   Physical Exam: BP 124/64 mmHg  Pulse 76  Ht 5\' 5"  (1.651 m)  Wt 127 lb (57.607 kg)  BMI 21.13 kg/m2  LMP 07/18/2012 General: Well developed black female in no acute distress; ambulates with walker. Head: Normocephalic and atraumatic Eyes:  Sclerae anicteric, conjunctiva pink  Ears: Normal auditory acuity Lungs: Clear throughout to auscultation Heart: Regular rate and rhythm Abdomen: Soft, non-distended.  Normal bowel sounds.  Non-tender. Rectal:  Sores noted in her gluteal cleft and on buttocks that are already being treated.  External hemorrhoids noted, one which was inflamed.  DRE did not reveal any masses.  Soft greenish-brown stool noted on exam glove that was heme negative. Musculoskeletal: Symmetrical with no gross deformities  Extremities: No edema  Neurological: Alert oriented x 4, grossly non-focal Psychological:  Alert and cooperative. Normal mood and affect  Assessment and Recommendations: -Anemia with heme negative stools today:  Her MCV is normal and iron levels are actually normal recently as well, but she has been on iron supplements.  Has been anemic in the past for which she had endoscopic evaluation in 2007.  Renal disease is likely contributing to her anemia.  Iron supplements could account for her previous dark stools.  CBC to be monitored by PCP or nephrology. -Hemorrhoids:  Noted on today's exam.  Will treat with analpram cream BID. -Diarrhea:  Patient is on several medications and was just on antibiotics in the hospital as well  as a course of Augmentin that was just completed, which could be contributing to her diarrhea.  Could check a Cdiff if diarrhea continues, but would just consider starting a probiotic for now and monitor.   -Recent intracranial hemorrhage December 2015 -Membranous glomerulonephritis -Rheumatoid arthritis -Diabetes  mellitus  *Would hold off with doing any procedures for now as she is high risk with her recent CVA/braintstem hemorrhage.   *Also, we adjusted her PPI.  She had listed that she was taking omeprazole 20 mg daily AND pantoprazole 40 mg BID.  We discontinued the omeprazole and decreased the pantoprazole to once daily for now. *Follow-up in 6-8 weeks.

## 2014-12-07 NOTE — Patient Instructions (Signed)
We have sent medications to your pharmacy for you to pick up at your convenience. Stop taking the omeprazole and decrease your pantoprazole to once daily. Follow up with Dr Ardis Hughs on 02/07/15 at 8:45 am. CC:  Beckie Salts MD

## 2014-12-07 NOTE — Progress Notes (Signed)
i agree with the above note, plan 

## 2014-12-25 ENCOUNTER — Encounter: Payer: Managed Care, Other (non HMO) | Attending: Physical Medicine & Rehabilitation

## 2014-12-25 ENCOUNTER — Encounter: Payer: Self-pay | Admitting: Physical Medicine & Rehabilitation

## 2014-12-25 ENCOUNTER — Ambulatory Visit (HOSPITAL_BASED_OUTPATIENT_CLINIC_OR_DEPARTMENT_OTHER): Payer: Managed Care, Other (non HMO) | Admitting: Physical Medicine & Rehabilitation

## 2014-12-25 VITALS — BP 118/58 | HR 76 | Resp 14

## 2014-12-25 DIAGNOSIS — K529 Noninfective gastroenteritis and colitis, unspecified: Secondary | ICD-10-CM | POA: Diagnosis not present

## 2014-12-25 DIAGNOSIS — I613 Nontraumatic intracerebral hemorrhage in brain stem: Secondary | ICD-10-CM | POA: Diagnosis present

## 2014-12-25 DIAGNOSIS — D62 Acute posthemorrhagic anemia: Secondary | ICD-10-CM | POA: Diagnosis not present

## 2014-12-25 NOTE — Progress Notes (Signed)
Subjective:    Patient ID: [name] [name], female    DOB: 1961/10/14, 54 y.o.   MRN: [id number] 54 y.o. female with history of HTN, hyperlipidemia, lupus with mild LLE weakness as well as compliance issues who was admitted on 09/28/14 with acute onset left sided weakness and numbness as well as gaze to the right side. Patient reported being out of BP meds for 3 weeks and BP 238/88 at admission. UDS negative. She was started on Cardene drip and CT Head done revealing acute hemorrhagic infarct in pons and chronic subcortical white matter disease in right frontoparietal area  HPI Taking BP meds No further GI problems Still getting home health nursing but has not resumed home health PT. No longer using a walker to ambulate down to a cane.  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 General activity 2 Relation with others 2 Enjoyment of life 4 What TIME of day is your pain at its worst? VARIES Sleep (in general) Fair  Pain is worse with: walking, standing and some activites Pain improves with: rest and therapy/exercise Relief from Meds: 1  Mobility walk without assistance use a cane how many minutes can you walk? 10 ability to climb steps?  yes do you drive?  no  Function disabled: date disabled .  Neuro/Psych weakness numbness tingling anxiety  Prior Studies Any changes since last visit?  no  Physicians involved in your care Any changes since last visit?  no   Family History  Problem Relation Age of Onset  . Hypertension    . Lupus    . Rheum arthritis    . Hypertension Mother   . Diabetes Mother   . Hypertension Sister   . Diabetes Father   . Hypertension Maternal Grandmother     History   Social History  . Marital Status: Single    Spouse Name: N/A  . Number of Children: N/A  . Years of Education: N/A   Social History Main Topics  . Smoking status: Former Smoker -- 0.50 packs/day for 30 years    Types: Cigarettes  . Smokeless tobacco: Never Used  . Alcohol Use: No  . Drug Use: No  . Sexual Activity: Not on file   Other Topics Concern  . None   Social History Narrative   Past Surgical History  Procedure Laterality Date  . Breast biopsy    . Tubal ligation     Past Medical History  Diagnosis Date  . Hypertension   . Lupus   . CKD (chronic kidney disease)     due to lupus/Dr. [name]  . Diabetes mellitus   . Stenosis of cervical spine region     with HNP at C5/6, C6/7  . Metatarsal bone fracture right    4th  . 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 Arthritis     RA  . Anemia   . Stroke 11/2014    left sided weakness, dysphagia  . Pain in joints   . Paresthesias   . Hyperlipidemia   . Colitis     ? per hospital notes 11/2014  . Lower  GI bleed   . Hyponatremia   . Metabolic acidosis   . Hyperplastic colon polyp   . Hemorrhoids    BP 118/58 mmHg  Pulse 76  Resp 14  SpO2 100%  LMP 07/18/2012  Opioid Risk Score:   Fall Risk Score: Low Fall Risk (0-5 points)  Review of Systems  HENT: Negative.   Eyes: Negative.   Respiratory: Negative.   Cardiovascular: Negative.   Gastrointestinal: Negative.   Endocrine: Negative.   Genitourinary: Negative.   Musculoskeletal: Positive for myalgias, back pain and arthralgias.  Skin: Negative.   Allergic/Immunologic: Negative.   Neurological: Positive for weakness and numbness.       Tingling hands and feet.  Hematological: Negative.   Psychiatric/Behavioral: The patient is nervous/anxious.        Objective:   Physical Exam  Visual fields are intact confrontation testing No evidence of nystagmus on extraocular muscle  testing Extraocular muscles are intact Motor strength is 5/5 bilateral deltoids, biceps, triceps, grip, hip flexor, knee extensor, ankle dorsal flexor plantar flexor  Gait has a widened base of support she uses a cane no evidence of toe drag or knee instability.      Assessment & Plan:  1. History of right hemorrhagic pontine infarct approximately 3 months ago. Has residual balance problems. Overall doing much better. Would recommend resuming her PT  Discussed gradual return to driving. Instructions below  Also discussed return to work at this point is not able to return to work but we will see how she does when she completes her therapy program. She may require some outpatient therapy as well  Graduated return to driving instructions were provided. It is recommended that the patient first drives with another licensed driver in an empty parking lot. If the patient does well with this, and they can drive on a quiet street with the licensed driver. If the patient does well with this they can drive on a busy street with a licensed driver. If the patient does well with this, the next time out they can go by himself. For the first month after resuming driving, I recommend no nighttime or Interstate driving.

## 2014-12-25 NOTE — Patient Instructions (Signed)

## 2015-01-03 ENCOUNTER — Other Ambulatory Visit: Payer: Self-pay

## 2015-01-03 ENCOUNTER — Telehealth: Payer: Self-pay | Admitting: *Deleted

## 2015-01-03 DIAGNOSIS — Z01810 Encounter for preprocedural cardiovascular examination: Secondary | ICD-10-CM

## 2015-01-03 DIAGNOSIS — N184 Chronic kidney disease, stage 4 (severe): Secondary | ICD-10-CM

## 2015-01-03 DIAGNOSIS — I129 Hypertensive chronic kidney disease with stage 1 through stage 4 chronic kidney disease, or unspecified chronic kidney disease: Secondary | ICD-10-CM

## 2015-01-03 NOTE — Telephone Encounter (Signed)
Asking for verbal orders to continue seeing patient 1 visit a week for 6 more weeks. Pt is about to start dialysis, is having ongoing anemia, pt looking pale. I gave Juliann Pulse the verbal order to continue home health care per office protocol

## 2015-01-05 ENCOUNTER — Encounter (HOSPITAL_COMMUNITY)
Admission: RE | Admit: 2015-01-05 | Discharge: 2015-01-05 | Disposition: A | Discharge status: Home / Self Care | Payer: Managed Care, Other (non HMO) | Source: Ambulatory Visit | Attending: Nephrology | Admitting: Nephrology

## 2015-01-05 ENCOUNTER — Encounter (HOSPITAL_COMMUNITY): Payer: Managed Care, Other (non HMO)

## 2015-01-05 DIAGNOSIS — N184 Chronic kidney disease, stage 4 (severe): Secondary | ICD-10-CM | POA: Diagnosis not present

## 2015-01-05 DIAGNOSIS — D631 Anemia in chronic kidney disease: Secondary | ICD-10-CM | POA: Insufficient documentation

## 2015-01-05 LAB — PREPARE RBC (CROSSMATCH)

## 2015-01-05 LAB — IRON AND TIBC
IRON: 135 ug/dL (ref 42–145)
Saturation Ratios: 68 % — ABNORMAL HIGH (ref 20–55)
TIBC: 199 ug/dL — ABNORMAL LOW (ref 250–470)
UIBC: 64 ug/dL — ABNORMAL LOW (ref 125–400)

## 2015-01-05 LAB — FERRITIN: FERRITIN: 1206 ng/mL — AB (ref 10–291)

## 2015-01-05 MED ORDER — SODIUM CHLORIDE 0.9 % IV SOLN: Freq: Once | INTRAVENOUS | Status: DC

## 2015-01-05 MED ORDER — EPOETIN ALFA 10000 UNIT/ML IJ SOLN
10000.0000 [IU] | INTRAMUSCULAR | Status: DC
  Administered 2015-01-05: 10000 [IU] via SUBCUTANEOUS

## 2015-01-05 MED ORDER — FUROSEMIDE 10 MG/ML IJ SOLN
40.0000 mg | Freq: Once | INTRAMUSCULAR | Status: AC
  Administered 2015-01-05: 40 mg via INTRAVENOUS

## 2015-01-06 DIAGNOSIS — D631 Anemia in chronic kidney disease: Secondary | ICD-10-CM | POA: Diagnosis not present

## 2015-01-06 LAB — TYPE AND SCREEN
ABO/RH(D): O POS
ANTIBODY SCREEN: NEGATIVE
Unit division: 0

## 2015-01-06 MED ORDER — FUROSEMIDE 10 MG/ML IJ SOLN
INTRAMUSCULAR | Status: AC
  Administered 2015-01-05: 40 mg via INTRAVENOUS
  Filled 2015-01-06: qty 4

## 2015-01-06 MED ORDER — EPOETIN ALFA 10000 UNIT/ML IJ SOLN
INTRAMUSCULAR | Status: AC
  Filled 2015-01-06: qty 1

## 2015-01-08 ENCOUNTER — Telehealth: Payer: Self-pay | Admitting: *Deleted

## 2015-01-08 LAB — POCT HEMOGLOBIN-HEMACUE: HEMOGLOBIN: 5.8 g/dL — AB (ref 12.0–15.0)

## 2015-01-08 NOTE — Telephone Encounter (Signed)
[name] of [facility] called to report a missed visit with the patient

## 2015-01-12 ENCOUNTER — Encounter (HOSPITAL_COMMUNITY)
Admission: RE | Admit: 2015-01-12 | Discharge: 2015-01-12 | Disposition: A | Discharge status: Home / Self Care | Payer: Managed Care, Other (non HMO) | Source: Ambulatory Visit | Attending: Nephrology | Admitting: Nephrology

## 2015-01-12 DIAGNOSIS — D631 Anemia in chronic kidney disease: Secondary | ICD-10-CM | POA: Diagnosis not present

## 2015-01-12 LAB — POCT HEMOGLOBIN-HEMACUE: Hemoglobin: 6.9 g/dL — CL (ref 12.0–15.0)

## 2015-01-12 MED ORDER — EPOETIN ALFA 10000 UNIT/ML IJ SOLN
10000.0000 [IU] | INTRAMUSCULAR | Status: DC
  Administered 2015-01-12: 10000 [IU] via SUBCUTANEOUS

## 2015-01-13 MED ORDER — EPOETIN ALFA 10000 UNIT/ML IJ SOLN
INTRAMUSCULAR | Status: AC
Start: 2015-01-13 — End: 2015-01-12
  Administered 2015-01-12: 10000 [IU] via SUBCUTANEOUS
  Filled 2015-01-13: qty 1

## 2015-01-15 DIAGNOSIS — I6931 Cognitive deficits following cerebral infarction: Secondary | ICD-10-CM | POA: Diagnosis not present

## 2015-01-15 DIAGNOSIS — R269 Unspecified abnormalities of gait and mobility: Secondary | ICD-10-CM | POA: Diagnosis not present

## 2015-01-15 DIAGNOSIS — E785 Hyperlipidemia, unspecified: Secondary | ICD-10-CM

## 2015-01-15 DIAGNOSIS — M329 Systemic lupus erythematosus, unspecified: Secondary | ICD-10-CM

## 2015-01-15 DIAGNOSIS — I69398 Other sequelae of cerebral infarction: Secondary | ICD-10-CM | POA: Diagnosis not present

## 2015-01-15 DIAGNOSIS — M6281 Muscle weakness (generalized): Secondary | ICD-10-CM | POA: Diagnosis not present

## 2015-01-15 DIAGNOSIS — I10 Essential (primary) hypertension: Secondary | ICD-10-CM

## 2015-01-18 ENCOUNTER — Emergency Department (HOSPITAL_COMMUNITY): Payer: Managed Care, Other (non HMO)

## 2015-01-18 ENCOUNTER — Encounter (HOSPITAL_COMMUNITY): Payer: Self-pay | Admitting: Emergency Medicine

## 2015-01-18 ENCOUNTER — Inpatient Hospital Stay (HOSPITAL_COMMUNITY)
Admission: EM | Admit: 2015-01-18 | Discharge: 2015-02-15 | DRG: 981 | Disposition: A | Discharge status: Rehabilitation Facility | Payer: Managed Care, Other (non HMO) | Attending: Internal Medicine | Admitting: Internal Medicine

## 2015-01-18 DIAGNOSIS — Z8249 Family history of ischemic heart disease and other diseases of the circulatory system: Secondary | ICD-10-CM

## 2015-01-18 DIAGNOSIS — D62 Acute posthemorrhagic anemia: Secondary | ICD-10-CM | POA: Diagnosis present

## 2015-01-18 DIAGNOSIS — Y92239 Unspecified place in hospital as the place of occurrence of the external cause: Secondary | ICD-10-CM

## 2015-01-18 DIAGNOSIS — R0602 Shortness of breath: Secondary | ICD-10-CM | POA: Diagnosis not present

## 2015-01-18 DIAGNOSIS — I340 Nonrheumatic mitral (valve) insufficiency: Secondary | ICD-10-CM | POA: Diagnosis present

## 2015-01-18 DIAGNOSIS — R778 Other specified abnormalities of plasma proteins: Secondary | ICD-10-CM | POA: Diagnosis present

## 2015-01-18 DIAGNOSIS — Z4659 Encounter for fitting and adjustment of other gastrointestinal appliance and device: Secondary | ICD-10-CM

## 2015-01-18 DIAGNOSIS — T448X6A Underdosing of centrally-acting and adrenergic-neuron-blocking agents, initial encounter: Secondary | ICD-10-CM | POA: Diagnosis present

## 2015-01-18 DIAGNOSIS — N179 Acute kidney failure, unspecified: Secondary | ICD-10-CM | POA: Diagnosis present

## 2015-01-18 DIAGNOSIS — R451 Restlessness and agitation: Secondary | ICD-10-CM | POA: Diagnosis present

## 2015-01-18 DIAGNOSIS — D649 Anemia, unspecified: Secondary | ICD-10-CM | POA: Diagnosis not present

## 2015-01-18 DIAGNOSIS — R7989 Other specified abnormal findings of blood chemistry: Secondary | ICD-10-CM

## 2015-01-18 DIAGNOSIS — N289 Disorder of kidney and ureter, unspecified: Secondary | ICD-10-CM

## 2015-01-18 DIAGNOSIS — J9601 Acute respiratory failure with hypoxia: Secondary | ICD-10-CM | POA: Insufficient documentation

## 2015-01-18 DIAGNOSIS — J80 Acute respiratory distress syndrome: Principal | ICD-10-CM | POA: Diagnosis present

## 2015-01-18 DIAGNOSIS — G9340 Encephalopathy, unspecified: Secondary | ICD-10-CM

## 2015-01-18 DIAGNOSIS — M3214 Glomerular disease in systemic lupus erythematosus: Secondary | ICD-10-CM | POA: Diagnosis present

## 2015-01-18 DIAGNOSIS — N184 Chronic kidney disease, stage 4 (severe): Secondary | ICD-10-CM | POA: Diagnosis present

## 2015-01-18 DIAGNOSIS — I214 Non-ST elevation (NSTEMI) myocardial infarction: Secondary | ICD-10-CM

## 2015-01-18 DIAGNOSIS — I5031 Acute diastolic (congestive) heart failure: Secondary | ICD-10-CM | POA: Insufficient documentation

## 2015-01-18 DIAGNOSIS — Z419 Encounter for procedure for purposes other than remedying health state, unspecified: Secondary | ICD-10-CM

## 2015-01-18 DIAGNOSIS — E46 Unspecified protein-calorie malnutrition: Secondary | ICD-10-CM | POA: Diagnosis present

## 2015-01-18 DIAGNOSIS — R5381 Other malaise: Secondary | ICD-10-CM | POA: Diagnosis not present

## 2015-01-18 DIAGNOSIS — Z79899 Other long term (current) drug therapy: Secondary | ICD-10-CM

## 2015-01-18 DIAGNOSIS — J029 Acute pharyngitis, unspecified: Secondary | ICD-10-CM | POA: Diagnosis present

## 2015-01-18 DIAGNOSIS — R109 Unspecified abdominal pain: Secondary | ICD-10-CM

## 2015-01-18 DIAGNOSIS — R6339 Other feeding difficulties: Secondary | ICD-10-CM

## 2015-01-18 DIAGNOSIS — Z452 Encounter for adjustment and management of vascular access device: Secondary | ICD-10-CM

## 2015-01-18 DIAGNOSIS — R000 Tachycardia, unspecified: Secondary | ICD-10-CM | POA: Diagnosis not present

## 2015-01-18 DIAGNOSIS — N186 End stage renal disease: Secondary | ICD-10-CM

## 2015-01-18 DIAGNOSIS — Z882 Allergy status to sulfonamides status: Secondary | ICD-10-CM

## 2015-01-18 DIAGNOSIS — J9811 Atelectasis: Secondary | ICD-10-CM | POA: Diagnosis present

## 2015-01-18 DIAGNOSIS — Z833 Family history of diabetes mellitus: Secondary | ICD-10-CM

## 2015-01-18 DIAGNOSIS — R0603 Acute respiratory distress: Secondary | ICD-10-CM

## 2015-01-18 DIAGNOSIS — E11649 Type 2 diabetes mellitus with hypoglycemia without coma: Secondary | ICD-10-CM | POA: Diagnosis present

## 2015-01-18 DIAGNOSIS — T43595A Adverse effect of other antipsychotics and neuroleptics, initial encounter: Secondary | ICD-10-CM | POA: Diagnosis not present

## 2015-01-18 DIAGNOSIS — Z87891 Personal history of nicotine dependence: Secondary | ICD-10-CM

## 2015-01-18 DIAGNOSIS — E871 Hypo-osmolality and hyponatremia: Secondary | ICD-10-CM | POA: Diagnosis present

## 2015-01-18 DIAGNOSIS — E785 Hyperlipidemia, unspecified: Secondary | ICD-10-CM | POA: Diagnosis present

## 2015-01-18 DIAGNOSIS — J9600 Acute respiratory failure, unspecified whether with hypoxia or hypercapnia: Secondary | ICD-10-CM

## 2015-01-18 DIAGNOSIS — I129 Hypertensive chronic kidney disease with stage 1 through stage 4 chronic kidney disease, or unspecified chronic kidney disease: Secondary | ICD-10-CM | POA: Diagnosis present

## 2015-01-18 DIAGNOSIS — I959 Hypotension, unspecified: Secondary | ICD-10-CM | POA: Diagnosis present

## 2015-01-18 DIAGNOSIS — R531 Weakness: Secondary | ICD-10-CM

## 2015-01-18 DIAGNOSIS — J069 Acute upper respiratory infection, unspecified: Secondary | ICD-10-CM | POA: Diagnosis present

## 2015-01-18 DIAGNOSIS — I471 Supraventricular tachycardia: Secondary | ICD-10-CM | POA: Diagnosis not present

## 2015-01-18 DIAGNOSIS — R410 Disorientation, unspecified: Secondary | ICD-10-CM

## 2015-01-18 DIAGNOSIS — L98499 Non-pressure chronic ulcer of skin of other sites with unspecified severity: Secondary | ICD-10-CM | POA: Diagnosis not present

## 2015-01-18 DIAGNOSIS — M329 Systemic lupus erythematosus, unspecified: Secondary | ICD-10-CM | POA: Diagnosis present

## 2015-01-18 DIAGNOSIS — Z8673 Personal history of transient ischemic attack (TIA), and cerebral infarction without residual deficits: Secondary | ICD-10-CM

## 2015-01-18 DIAGNOSIS — E872 Acidosis: Secondary | ICD-10-CM | POA: Diagnosis present

## 2015-01-18 DIAGNOSIS — I10 Essential (primary) hypertension: Secondary | ICD-10-CM

## 2015-01-18 DIAGNOSIS — M1990 Unspecified osteoarthritis, unspecified site: Secondary | ICD-10-CM | POA: Diagnosis present

## 2015-01-18 DIAGNOSIS — Z9119 Patient's noncompliance with other medical treatment and regimen: Secondary | ICD-10-CM | POA: Diagnosis present

## 2015-01-18 DIAGNOSIS — Z992 Dependence on renal dialysis: Secondary | ICD-10-CM

## 2015-01-18 DIAGNOSIS — J189 Pneumonia, unspecified organism: Secondary | ICD-10-CM | POA: Diagnosis present

## 2015-01-18 DIAGNOSIS — I9589 Other hypotension: Secondary | ICD-10-CM | POA: Insufficient documentation

## 2015-01-18 DIAGNOSIS — Z791 Long term (current) use of non-steroidal anti-inflammatories (NSAID): Secondary | ICD-10-CM

## 2015-01-18 DIAGNOSIS — R197 Diarrhea, unspecified: Secondary | ICD-10-CM | POA: Diagnosis present

## 2015-01-18 DIAGNOSIS — R633 Feeding difficulties: Secondary | ICD-10-CM

## 2015-01-18 DIAGNOSIS — Y95 Nosocomial condition: Secondary | ICD-10-CM

## 2015-01-18 DIAGNOSIS — N189 Chronic kidney disease, unspecified: Secondary | ICD-10-CM

## 2015-01-18 DIAGNOSIS — D638 Anemia in other chronic diseases classified elsewhere: Secondary | ICD-10-CM | POA: Diagnosis present

## 2015-01-18 HISTORY — DX: Encounter for other specified aftercare: Z51.89

## 2015-01-18 LAB — GLUCOSE, CAPILLARY: GLUCOSE-CAPILLARY: 96 mg/dL (ref 70–99)

## 2015-01-18 LAB — BASIC METABOLIC PANEL
Anion gap: 10 (ref 5–15)
BUN: 38 mg/dL — AB (ref 6–23)
CO2: 20 mmol/L (ref 19–32)
Calcium: 6.6 mg/dL — ABNORMAL LOW (ref 8.4–10.5)
Chloride: 105 mmol/L (ref 96–112)
Creatinine, Ser: 2.97 mg/dL — ABNORMAL HIGH (ref 0.50–1.10)
GFR calc non Af Amer: 17 mL/min — ABNORMAL LOW (ref >90)
GFR, EST AFRICAN AMERICAN: 20 mL/min — AB (ref >90)
Glucose, Bld: 144 mg/dL — ABNORMAL HIGH (ref 70–99)
Potassium: 4.3 mmol/L (ref 3.5–5.1)
Sodium: 135 mmol/L (ref 135–145)

## 2015-01-18 LAB — CBC
HCT: 22.4 % — ABNORMAL LOW (ref 36.0–46.0)
Hemoglobin: 7.1 g/dL — ABNORMAL LOW (ref 12.0–15.0)
MCH: 26.6 pg (ref 26.0–34.0)
MCHC: 31.7 g/dL (ref 30.0–36.0)
MCV: 83.9 fL (ref 78.0–100.0)
Platelets: 279 10*3/uL (ref 150–400)
RBC: 2.67 MIL/uL — AB (ref 3.87–5.11)
RDW: 14.6 % (ref 11.5–15.5)
WBC: 13.3 10*3/uL — AB (ref 4.0–10.5)

## 2015-01-18 LAB — I-STAT TROPONIN, ED: TROPONIN I, POC: 0.33 ng/mL — AB (ref 0.00–0.08)

## 2015-01-18 LAB — I-STAT CG4 LACTIC ACID, ED: Lactic Acid, Venous: 1.48 mmol/L (ref 0.5–2.0)

## 2015-01-18 LAB — TROPONIN I: Troponin I: 0.57 ng/mL (ref <0.031)

## 2015-01-18 LAB — POC OCCULT BLOOD, ED: Fecal Occult Bld: NEGATIVE

## 2015-01-18 LAB — PREPARE RBC (CROSSMATCH)

## 2015-01-18 MED ORDER — ATORVASTATIN CALCIUM 20 MG PO TABS
20.0000 mg | ORAL_TABLET | Freq: Every day | ORAL | Status: DC
  Administered 2015-01-19 – 2015-01-20 (×3): 20 mg via ORAL
  Filled 2015-01-18 (×4): qty 1

## 2015-01-18 MED ORDER — ISOSORBIDE MONONITRATE ER 60 MG PO TB24
60.0000 mg | ORAL_TABLET | Freq: Every day | ORAL | Status: DC
  Filled 2015-01-18: qty 1

## 2015-01-18 MED ORDER — SODIUM CHLORIDE 0.9 % IJ SOLN
3.0000 mL | Freq: Two times a day (BID) | INTRAMUSCULAR | Status: DC
  Administered 2015-01-19 – 2015-01-24 (×7): 3 mL via INTRAVENOUS
  Administered 2015-01-26: 6 mL via INTRAVENOUS
  Administered 2015-01-26 – 2015-01-28 (×3): 3 mL via INTRAVENOUS

## 2015-01-18 MED ORDER — CLONIDINE HCL 0.3 MG PO TABS
0.3000 mg | ORAL_TABLET | Freq: Three times a day (TID) | ORAL | Status: DC
  Administered 2015-01-19: 0.3 mg via ORAL
  Filled 2015-01-18 (×4): qty 1

## 2015-01-18 MED ORDER — ONDANSETRON HCL 4 MG/2ML IJ SOLN
4.0000 mg | Freq: Four times a day (QID) | INTRAMUSCULAR | Status: DC | PRN
  Administered 2015-01-21: 4 mg via INTRAVENOUS
  Filled 2015-01-18: qty 2

## 2015-01-18 MED ORDER — NICOTINE 14 MG/24HR TD PT24
14.0000 mg | MEDICATED_PATCH | TRANSDERMAL | Status: DC
Start: 2015-01-18 — End: 2015-01-21
  Filled 2015-01-18 (×4): qty 1

## 2015-01-18 MED ORDER — SODIUM BICARBONATE 650 MG PO TABS
1300.0000 mg | ORAL_TABLET | Freq: Two times a day (BID) | ORAL | Status: DC
  Administered 2015-01-19 – 2015-01-22 (×7): 1300 mg via ORAL
  Filled 2015-01-18 (×10): qty 2

## 2015-01-18 MED ORDER — HYDROCORTISONE ACE-PRAMOXINE 1-1 % RE CREA
1.0000 "application " | TOPICAL_CREAM | Freq: Two times a day (BID) | RECTAL | Status: DC
  Filled 2015-01-18: qty 30

## 2015-01-18 MED ORDER — FOLIC ACID 1 MG PO TABS
1.0000 mg | ORAL_TABLET | Freq: Every day | ORAL | Status: DC
  Administered 2015-01-19 – 2015-02-15 (×25): 1 mg via ORAL
  Filled 2015-01-18 (×28): qty 1

## 2015-01-18 MED ORDER — ALPRAZOLAM 0.5 MG PO TABS
0.5000 mg | ORAL_TABLET | Freq: Two times a day (BID) | ORAL | Status: DC | PRN
  Administered 2015-01-21: 0.5 mg via ORAL
  Filled 2015-01-18: qty 1

## 2015-01-18 MED ORDER — SODIUM CHLORIDE 0.9 % IJ SOLN
3.0000 mL | Freq: Two times a day (BID) | INTRAMUSCULAR | Status: DC
  Administered 2015-01-18 – 2015-01-23 (×6): 3 mL via INTRAVENOUS

## 2015-01-18 MED ORDER — MYCOPHENOLATE MOFETIL 250 MG PO CAPS
1000.0000 mg | ORAL_CAPSULE | Freq: Two times a day (BID) | ORAL | Status: DC
  Administered 2015-01-19 – 2015-01-20 (×5): 1000 mg via ORAL
  Filled 2015-01-18 (×7): qty 4

## 2015-01-18 MED ORDER — ONDANSETRON HCL 4 MG PO TABS: 4.0000 mg | ORAL_TABLET | Freq: Four times a day (QID) | ORAL | Status: DC | PRN

## 2015-01-18 MED ORDER — ASPIRIN 81 MG PO CHEW
324.0000 mg | CHEWABLE_TABLET | Freq: Once | ORAL | Status: AC
  Administered 2015-01-18: 324 mg via ORAL
  Filled 2015-01-18: qty 4

## 2015-01-18 MED ORDER — FUROSEMIDE 80 MG PO TABS
80.0000 mg | ORAL_TABLET | Freq: Two times a day (BID) | ORAL | Status: DC
  Administered 2015-01-19: 80 mg via ORAL
  Filled 2015-01-18 (×4): qty 1

## 2015-01-18 MED ORDER — GABAPENTIN 100 MG PO CAPS
100.0000 mg | ORAL_CAPSULE | Freq: Every day | ORAL | Status: DC
  Administered 2015-01-19 – 2015-01-20 (×3): 100 mg via ORAL
  Filled 2015-01-18 (×4): qty 1

## 2015-01-18 MED ORDER — FERROUS SULFATE 325 (65 FE) MG PO TABS
325.0000 mg | ORAL_TABLET | Freq: Two times a day (BID) | ORAL | Status: DC
  Administered 2015-01-19 – 2015-01-24 (×10): 325 mg via ORAL
  Filled 2015-01-18 (×17): qty 1

## 2015-01-18 MED ORDER — AMLODIPINE BESYLATE 10 MG PO TABS
10.0000 mg | ORAL_TABLET | Freq: Every day | ORAL | Status: DC
  Filled 2015-01-18: qty 1

## 2015-01-18 MED ORDER — LABETALOL HCL 200 MG PO TABS
400.0000 mg | ORAL_TABLET | Freq: Two times a day (BID) | ORAL | Status: DC
  Administered 2015-01-19: 400 mg via ORAL
  Filled 2015-01-18 (×3): qty 2

## 2015-01-18 MED ORDER — VITAMIN B-1 100 MG PO TABS
100.0000 mg | ORAL_TABLET | Freq: Every day | ORAL | Status: DC
  Administered 2015-01-19 – 2015-02-15 (×25): 100 mg via ORAL
  Filled 2015-01-18 (×28): qty 1

## 2015-01-18 MED ORDER — VITAMIN D (ERGOCALCIFEROL) 1.25 MG (50000 UNIT) PO CAPS
50000.0000 [IU] | ORAL_CAPSULE | ORAL | Status: DC
  Filled 2015-01-18: qty 1

## 2015-01-18 MED ORDER — HYDROXYCHLOROQUINE SULFATE 200 MG PO TABS
400.0000 mg | ORAL_TABLET | Freq: Every day | ORAL | Status: DC
  Administered 2015-01-19 – 2015-01-20 (×2): 400 mg via ORAL
  Filled 2015-01-18 (×3): qty 2

## 2015-01-18 MED ORDER — SODIUM CHLORIDE 0.9 % IV BOLUS (SEPSIS)
500.0000 mL | Freq: Once | INTRAVENOUS | Status: AC
  Administered 2015-01-18: 500 mL via INTRAVENOUS

## 2015-01-18 MED ORDER — SODIUM CHLORIDE 0.9 % IV SOLN
INTRAVENOUS | Status: DC
  Administered 2015-01-18: 19:00:00 via INTRAVENOUS

## 2015-01-18 MED ORDER — SODIUM CHLORIDE 0.9 % IJ SOLN: 3.0000 mL | INTRAMUSCULAR | Status: DC | PRN

## 2015-01-18 MED ORDER — OXYCODONE HCL 5 MG PO TABS
5.0000 mg | ORAL_TABLET | ORAL | Status: DC | PRN
  Administered 2015-01-20 (×2): 5 mg via ORAL
  Filled 2015-01-18 (×2): qty 1

## 2015-01-18 MED ORDER — SODIUM CHLORIDE 0.9 % IV SOLN: 250.0000 mL | INTRAVENOUS | Status: DC | PRN

## 2015-01-18 MED ORDER — HYDROCORTISONE ACE-PRAMOXINE 1-1 % RE FOAM
1.0000 | Freq: Two times a day (BID) | RECTAL | Status: DC
  Administered 2015-01-20 – 2015-01-24 (×9): 1 via RECTAL
  Filled 2015-01-18 (×8): qty 10

## 2015-01-18 MED ORDER — HYDRALAZINE HCL 25 MG PO TABS
25.0000 mg | ORAL_TABLET | Freq: Three times a day (TID) | ORAL | Status: DC
  Administered 2015-01-19: 25 mg via ORAL
  Filled 2015-01-18 (×5): qty 1

## 2015-01-18 MED ORDER — PANTOPRAZOLE SODIUM 40 MG PO TBEC
40.0000 mg | DELAYED_RELEASE_TABLET | Freq: Every day | ORAL | Status: DC
  Administered 2015-01-19 – 2015-01-20 (×2): 40 mg via ORAL
  Filled 2015-01-18 (×2): qty 1

## 2015-01-18 MED ORDER — RENA-VITE PO TABS
1.0000 | ORAL_TABLET | Freq: Every day | ORAL | Status: DC
Start: 2015-01-18 — End: 2015-02-15
  Administered 2015-01-19 – 2015-02-14 (×27): 1 via ORAL
  Filled 2015-01-18 (×31): qty 1

## 2015-01-18 NOTE — ED Notes (Signed)
Pt undressed, in gown, on monitor, continuous pulse oximetry and blood pressure cuff; assisted phlebotomy with attempting blood collection with no success; will wait until pt is more hydrated to attempt recollection of bloodwork; family at bedside and is aware along with pt

## 2015-01-18 NOTE — ED Notes (Addendum)
Pt from home via [facility] with c/o SOB and diarrhea starting this am.  Pt reports starting to feel sick yesterday with a sore throat.  EMS reports pt pale and hx of low HGB with transfusion 3 weeks ago.  Pt given 500 cc NS for tachycardia.  EKG ST and unremarkable.  Pt in NAD, A&O.

## 2015-01-18 NOTE — H&P (Addendum)
[facility] History and Physical  JOZLIN SCHUTH ZOX:[id number] DOB: 1961/02/22 DOA: 01/18/2015  Referring physician: Gray Bernhardt, MD PCP: Abbe Amsterdam, MD   Chief Complaint: Shortness of Breath  HPI: [name] [name] is a 54 y.o. female presents with shortness of breath. Patient states that she has been experiencing shortness of breath since yesterday. Patient states that she was also having cold like symptoms. Patient states that she has had a cough and some congestion. She states that she has some white mucus coming up. She also admits to having a sore throat. She states taht she has not yet been treated for this. In addition she had labwork in the ED and this shows significant anemia. Patient has been having anemia for some time. She was recently in the hospital for the anemia but a workup was not conclusive. She states that she has been taking iron. Also she had a minimally elevated troponin level. She sttes her daughter did have a sore throat on sunday   Review of Systems:  Constitutional:  No weight loss, night sweats, +Fevers, +chills, +fatigue.  HEENT:  No headaches, No sneezing, itching, ear ache, +nasal congestion, +post nasal drip,  Cardio-vascular:  No chest pain, swelling in lower extremities, anasarca, dizziness, palpitations  GI:  No heartburn, indigestion, abdominal pain, nausea, vomiting, +diarrhea Resp:  +shortness of breath with exertion +productive cough,  No coughing up of blood Skin:  no rash or lesions.  GU:  no dysuria, change in color of urine, no urgency or frequency Musculoskeletal:  No joint pain or swelling. No decreased range of motion Psych:  No change in mood or affect. No depression or anxiety. No memory loss.   Past Medical History  Diagnosis Date  . Hypertension   . Lupus   . CKD (chronic kidney disease)     due to lupus/Dr. Hyman Hopes  . Diabetes mellitus   . Stenosis of cervical spine region     with HNP at C5/6, C6/7  . Metatarsal  bone fracture right    4th  . 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 Arthritis     RA  . Anemia   . Stroke 11/2014    left sided weakness, dysphagia  . Pain in joints   . Paresthesias   . Hyperlipidemia   . Colitis     ? per hospital notes 11/2014  . Lower GI bleed   . Hyponatremia   . Metabolic acidosis   . Hyperplastic colon polyp   . Hemorrhoids   . Blood transfusion without reported diagnosis    Past Surgical History  Procedure Laterality Date  . Breast biopsy    . Tubal ligation     Social History:  reports that she has quit smoking. Her smoking use included Cigarettes. She has a 15 pack-year smoking history. She has never used smokeless tobacco. She reports that she does not drink alcohol or use illicit drugs.  Allergies  Allergen Reactions  . Sulfa Antibiotics Itching    Family History  Problem Relation Age of Onset  . Hypertension    . Lupus    . Rheum arthritis    . Hypertension Mother   . Diabetes Mother   . Hypertension Sister   . Diabetes Father   . Hypertension Maternal Grandmother      Prior to Admission medications   Medication Sig Start Date End Date Taking? Authorizing Provider  ALPRAZolam Prudy Feeler)  0.5 MG tablet Take 1 tablet (0.5 mg total) by mouth 2 (two) times daily as needed for anxiety. 10/25/14  Yes Evlyn Kanner Love, PA-C  amLODipine (NORVASC) 10 MG tablet Take 1 tablet (10 mg total) by mouth daily. 10/25/14  Yes Evlyn Kanner Love, PA-C  atorvastatin (LIPITOR) 20 MG tablet Take 1 tablet (20 mg total) by mouth daily at 6 PM. 10/25/14  Yes Evlyn Kanner Love, PA-C  cloNIDine (CATAPRES) 0.3 MG tablet Take 1 tablet (0.3 mg total) by mouth 3 (three) times daily. 10/25/14  Yes Evlyn Kanner Love, PA-C  ferrous sulfate 325 (65 FE) MG tablet Take 1 tablet (325 mg total) by mouth daily with breakfast. Patient taking differently: Take 325 mg by mouth 2 (two) times daily with a meal.  10/25/14  Yes Evlyn Kanner Love, PA-C    furosemide (LASIX) 80 MG tablet Take 80 mg by mouth 2 (two) times daily. 10/25/14  Yes Historical Provider, MD  gabapentin (NEURONTIN) 100 MG capsule Take 1 capsule (100 mg total) by mouth at bedtime. 11/25/14  Yes Catarina Hartshorn, MD  hydrALAZINE (APRESOLINE) 25 MG tablet Take 25 mg by mouth every 8 (eight) hours. 10/25/14  Yes Historical Provider, MD  hydroxychloroquine (PLAQUENIL) 200 MG tablet Take 2 tablets (400 mg total) by mouth daily. 10/25/14  Yes Evlyn Kanner Love, PA-C  labetalol (NORMODYNE) 300 MG tablet Take 2 tablets (600 mg total) by mouth 2 (two) times daily. 11/25/14  Yes Catarina Hartshorn, MD  mycophenolate (CELLCEPT) 250 MG capsule Take 4 capsules (1,000 mg total) by mouth 2 (two) times daily. 10/25/14  Yes Evlyn Kanner Love, PA-C  pantoprazole (PROTONIX) 40 MG tablet Take 1 tablet (40 mg total) by mouth daily. 12/07/14  Yes Jessica D Zehr, PA-C  sodium bicarbonate 650 MG tablet Take 2 tablets (1,300 mg total) by mouth 2 (two) times daily. 10/25/14  Yes Evlyn Kanner Love, PA-C  acetaminophen (TYLENOL) 325 MG tablet Take 2 tablets (650 mg total) by mouth every 4 (four) hours as needed for mild pain. 10/25/14   Evlyn Kanner Love, PA-C  amoxicillin-clavulanate (AUGMENTIN) 500-125 MG per tablet Take 1 tablet (500 mg total) by mouth 2 (two) times daily. Patient not taking: Reported on 12/07/2014 11/25/14   Catarina Hartshorn, MD  clotrimazole (MYCELEX) 10 MG troche Take 1 tablet (10 mg total) by mouth 5 (five) times daily. 11/25/14   Catarina Hartshorn, MD  cyclobenzaprine (FLEXERIL) 5 MG tablet  10/10/14   Historical Provider, MD  diclofenac sodium (VOLTAREN) 1 % GEL Apply 2 g topically 4 (four) times daily. To forefoot 10/25/14   Evlyn Kanner Love, PA-C  isosorbide mononitrate (IMDUR) 60 MG 24 hr tablet Take 1 tablet (60 mg total) by mouth daily. 10/25/14   Jacquelynn Cree, PA-C  labetalol (NORMODYNE) 200 MG tablet Take 400 mg by mouth 2 (two) times daily. 10/25/14   Historical Provider, MD  lidocaine (XYLOCAINE) 2 % solution  11/06/14    Historical Provider, MD  Menthol-Methyl Salicylate (MUSCLE RUB) 10-15 % CREA Apply 1 application topically 3 (three) times daily before meals. 10/25/14   Jacquelynn Cree, PA-C  multivitamin (RENA-VIT) TABS tablet Take 1 tablet by mouth daily.    Historical Provider, MD  nicotine (NICODERM CQ - DOSED IN MG/24 HOURS) 14 mg/24hr patch Place 1 patch (14 mg total) onto the skin daily. 10/25/14   Jacquelynn Cree, PA-C  nystatin-triamcinolone (MYCOLOG II) cream Apply topically 2 (two) times daily. 11/06/14   Historical Provider, MD  nystatin-triamcinolone ointment (MYCOLOG) Apply 1  application topically 2 (two) times daily. 11/06/14   Mercedes Camprubi-Soms, PA-C  pramoxine-hydrocortisone (PROCTOCREAM-HC) 1-1 % rectal cream Place 1 application rectally 2 (two) times daily. 12/07/14   Princella Pellegrini Zehr, PA-C  predniSONE (DELTASONE) 10 MG tablet Take 3 tablets (30 mg total) by mouth daily with breakfast. Start 11/26/14 Patient not taking: Reported on 12/07/2014 11/26/14   Catarina Hartshorn, MD  senna-docusate (SENOKOT-S) 8.6-50 MG per tablet Take 1 tablet by mouth 2 (two) times daily. 10/25/14   Jacquelynn Cree, PA-C  Vitamin D, Ergocalciferol, (DRISDOL) 50000 UNITS CAPS capsule Take 1 capsule (50,000 Units total) by mouth every Wednesday. 10/25/14   Jacquelynn Cree, PA-C   Physical Exam: Filed Vitals:   01/18/15 1848 01/18/15 1851 01/18/15 2015 01/18/15 2037  BP: 95/75  110/57 111/64  Pulse:   115 115  Temp:   98.8 F (37.1 C) 99.2 F (37.3 C)  TempSrc:   Oral Oral  Resp: 17 18 19 17   SpO2:  100% 100% 100%    Wt Readings from Last 3 Encounters:  12/07/14 57.607 kg (127 lb)  11/25/14 64.5 kg (142 lb 3.2 oz)  11/10/14 59.421 kg (131 lb)    General:  Appears calm and comfortable Eyes: PERRL, normal lids, irises & conjunctiva ENT: grossly normal hearing, lips & tongue Neck: no LAD, masses or thyromegaly Cardiovascular: RRR, no m/r/g Respiratory: CTA bilaterally, no w/r/r Abdomen: soft, ntnd Skin: no rash or induration  seen on limited exam Musculoskeletal: grossly normal tone BUE/BLE Psychiatric: grossly normal mood and affect, speech fluent and appropriate Neurologic: grossly non-focal.          Labs on Admission:  Basic Metabolic Panel:  Recent Labs Lab 01/18/15 1840  NA 135  K 4.3  CL 105  CO2 20  GLUCOSE 144*  BUN 38*  CREATININE 2.97*  CALCIUM 6.6*   Liver Function Tests: No results for input(s): AST, ALT, ALKPHOS, BILITOT, PROT, ALBUMIN in the last 168 hours. No results for input(s): LIPASE, AMYLASE in the last 168 hours. No results for input(s): AMMONIA in the last 168 hours. CBC:  Recent Labs Lab 01/12/15 1512 01/18/15 1840  WBC  --  13.3*  HGB 6.9* 7.1*  HCT  --  22.4*  MCV  --  83.9  PLT  --  279   Cardiac Enzymes: No results for input(s): CKTOTAL, CKMB, CKMBINDEX, TROPONINI in the last 168 hours.  BNP (last 3 results) No results for input(s): BNP in the last 8760 hours.  ProBNP (last 3 results)  Recent Labs  10/05/14 0521  PROBNP 20673.0*    CBG: No results for input(s): GLUCAP in the last 168 hours.  Radiological Exams on Admission: Dg Chest Portable 1 View  01/18/2015   CLINICAL DATA:  Acute onset of shortness of breath and diarrhea. Initial encounter.  EXAM: PORTABLE CHEST - 1 VIEW  COMPARISON:  Chest radiograph performed 11/22/2014  FINDINGS: The lungs are well-aerated and clear. There is no evidence of focal opacification, pleural effusion or pneumothorax.  The cardiomediastinal silhouette is within normal limits. No acute osseous abnormalities are seen.  IMPRESSION: No acute cardiopulmonary process seen.   Electronically Signed   By: Roanna Raider M.D.   On: 01/18/2015 19:13      Assessment/Plan Active Problems:   Essential hypertension   Anemia   Acute blood loss anemia   CKD (chronic kidney disease) stage 4, GFR 15-29 ml/min   Shortness of breath   1. Symptomatic Anemia -will be admitted for blood transfusion -repeat  labs in  am  2. HTN -pressures are soft  -may be related to anemia -will monitor after transfusion  3. CKD IV -secondary to lupus -will continue to monitor -she is followed by nephrology and currently is not on dialysis  4. Lupus -on immune suppressives -will continue with present therapy  5. Shortness of breath -related to anemia -CXR was reviewed and currently no failure or infiltrates noted  6. URI -will start on empiric antibiotics -also check flu swab  7. Elevated Glucose -will monitor FSBS -coverage as needed   Code Status: Full Code (must indicate code status--if unknown or must be presumed, indicate so) DVT Prophylaxis:SCD Family Communication: None (indicate person spoken with, if applicable, with phone number if by telephone) Disposition Plan: Home (indicate anticipated LOS)  Time spent:  [facility] Pager [phone]

## 2015-01-18 NOTE — ED Notes (Signed)
Notified Dr Eulis Foster of pt BP and condition.

## 2015-01-18 NOTE — ED Provider Notes (Signed)
CSN: [id number]     Arrival date & time 01/18/15  1731 History   First MD Initiated Contact with Patient 01/18/15 1756     Chief Complaint  Patient presents with  . Shortness of Breath  . Diarrhea     (Consider location/radiation/quality/duration/timing/severity/associated sxs/prior Treatment) HPI   [name] [name] is a 54 y.o. female who presents for evaluation of sore throat, shortness of breath, chest congestion, and decreased oral intake for 2 days. She has not seen any rectal bleeding recently. She is getting regular iron infusions, and had a recent gastrointestinal bleed, requiring transfusion of blood. She is debilitated from having a prior stroke, several months ago. She is here with family members. She lives with family. Been no recent fever, chills, cough, shortness of breath or chest pain. She was transferred by EMS, who found her to have hypotension, and tachycardia. She was treated with IV fluids with improvement of her tachycardia. She received an injection of Procrit, ordered by her kidney doctor, 3 days ago. There are no other known modifying factors.   Past Medical History  Diagnosis Date  . Hypertension   . Lupus   . CKD (chronic kidney disease)     due to lupus/Dr. [name]  . Diabetes mellitus   . Stenosis of cervical spine region     with HNP at C5/6, C6/7  . Metatarsal bone fracture right    4th  . 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 Arthritis     RA  . Anemia   . Stroke 11/2014    left sided weakness, dysphagia  . Pain in joints   . Paresthesias   . Hyperlipidemia   . Colitis     ? per hospital notes 11/2014  . Lower GI bleed   . Hyponatremia   . Metabolic acidosis   . Hyperplastic colon polyp   . Hemorrhoids   . Blood transfusion without reported diagnosis    Past Surgical History  Procedure Laterality Date  . Breast biopsy    . Tubal ligation     Family History  Problem Relation Age of Onset  .  Hypertension    . Lupus    . Rheum arthritis    . Hypertension Mother   . Diabetes Mother   . Hypertension Sister   . Diabetes Father   . Hypertension Maternal Grandmother    History  Substance Use Topics  . Smoking status: Former Smoker -- 0.50 packs/day for 30 years    Types: Cigarettes  . Smokeless tobacco: Never Used  . Alcohol Use: No   OB History    No data available     Review of Systems  All other systems reviewed and are negative.     Allergies  Sulfa antibiotics  Home Medications   Prior to Admission medications   Medication Sig Start Date End Date Taking? Authorizing Provider  ALPRAZolam Duanne Moron) 0.5 MG tablet Take 1 tablet (0.5 mg total) by mouth 2 (two) times daily as needed for anxiety. 10/25/14  Yes Ivan Anchors Love, PA-C  amLODipine (NORVASC) 10 MG tablet Take 1 tablet (10 mg total) by mouth daily. 10/25/14  Yes Ivan Anchors Love, PA-C  atorvastatin (LIPITOR) 20 MG tablet Take 1 tablet (20 mg total) by mouth daily at 6 PM. 10/25/14  Yes Ivan Anchors Love, PA-C  cloNIDine (CATAPRES) 0.3 MG tablet Take 1 tablet (0.3 mg total) by mouth 3 (  three) times daily. 10/25/14  Yes Ivan Anchors Love, PA-C  ferrous sulfate 325 (65 FE) MG tablet Take 1 tablet (325 mg total) by mouth daily with breakfast. Patient taking differently: Take 325 mg by mouth 2 (two) times daily with a meal.  10/25/14  Yes Ivan Anchors Love, PA-C  furosemide (LASIX) 80 MG tablet Take 80 mg by mouth 2 (two) times daily. 10/25/14  Yes Historical Provider, MD  gabapentin (NEURONTIN) 100 MG capsule Take 1 capsule (100 mg total) by mouth at bedtime. 11/25/14  Yes Orson Eva, MD  hydrALAZINE (APRESOLINE) 25 MG tablet Take 25 mg by mouth every 8 (eight) hours. 10/25/14  Yes Historical Provider, MD  hydroxychloroquine (PLAQUENIL) 200 MG tablet Take 2 tablets (400 mg total) by mouth daily. 10/25/14  Yes Ivan Anchors Love, PA-C  labetalol (NORMODYNE) 300 MG tablet Take 2 tablets (600 mg total) by mouth 2 (two) times daily. 11/25/14   Yes Orson Eva, MD  mycophenolate (CELLCEPT) 250 MG capsule Take 4 capsules (1,000 mg total) by mouth 2 (two) times daily. 10/25/14  Yes Ivan Anchors Love, PA-C  pantoprazole (PROTONIX) 40 MG tablet Take 1 tablet (40 mg total) by mouth daily. 12/07/14  Yes Jessica D Zehr, PA-C  sodium bicarbonate 650 MG tablet Take 2 tablets (1,300 mg total) by mouth 2 (two) times daily. 10/25/14  Yes Ivan Anchors Love, PA-C  acetaminophen (TYLENOL) 325 MG tablet Take 2 tablets (650 mg total) by mouth every 4 (four) hours as needed for mild pain. 10/25/14   Ivan Anchors Love, PA-C  amoxicillin-clavulanate (AUGMENTIN) 500-125 MG per tablet Take 1 tablet (500 mg total) by mouth 2 (two) times daily. Patient not taking: Reported on 12/07/2014 11/25/14   Orson Eva, MD  clotrimazole (MYCELEX) 10 MG troche Take 1 tablet (10 mg total) by mouth 5 (five) times daily. 11/25/14   Orson Eva, MD  cyclobenzaprine (FLEXERIL) 5 MG tablet  10/10/14   Historical Provider, MD  diclofenac sodium (VOLTAREN) 1 % GEL Apply 2 g topically 4 (four) times daily. To forefoot 10/25/14   Ivan Anchors Love, PA-C  isosorbide mononitrate (IMDUR) 60 MG 24 hr tablet Take 1 tablet (60 mg total) by mouth daily. 10/25/14   Bary Leriche, PA-C  labetalol (NORMODYNE) 200 MG tablet Take 400 mg by mouth 2 (two) times daily. 10/25/14   Historical Provider, MD  lidocaine (XYLOCAINE) 2 % solution  11/06/14   Historical Provider, MD  Menthol-Methyl Salicylate (MUSCLE RUB) 10-15 % CREA Apply 1 application topically 3 (three) times daily before meals. 10/25/14   Bary Leriche, PA-C  multivitamin (RENA-VIT) TABS tablet Take 1 tablet by mouth daily.    Historical Provider, MD  nicotine (NICODERM CQ - DOSED IN MG/24 HOURS) 14 mg/24hr patch Place 1 patch (14 mg total) onto the skin daily. 10/25/14   Bary Leriche, PA-C  nystatin-triamcinolone (MYCOLOG II) cream Apply topically 2 (two) times daily. 11/06/14   Historical Provider, MD  nystatin-triamcinolone ointment (MYCOLOG) Apply 1 application  topically 2 (two) times daily. 11/06/14   Mercedes Camprubi-Soms, PA-C  pramoxine-hydrocortisone (PROCTOCREAM-HC) 1-1 % rectal cream Place 1 application rectally 2 (two) times daily. 12/07/14   Laban Emperor Zehr, PA-C  predniSONE (DELTASONE) 10 MG tablet Take 3 tablets (30 mg total) by mouth daily with breakfast. Start 11/26/14 Patient not taking: Reported on 12/07/2014 11/26/14   Orson Eva, MD  senna-docusate (SENOKOT-S) 8.6-50 MG per tablet Take 1 tablet by mouth 2 (two) times daily. 10/25/14   Bary Leriche, PA-C  Vitamin D, Ergocalciferol, (DRISDOL) 50000 UNITS CAPS capsule Take 1 capsule (50,000 Units total) by mouth every Wednesday. 10/25/14   Bary Leriche, PA-C   BP 112/62 mmHg  Pulse 92  Temp(Src) 98.8 F (37.1 C) (Oral)  Resp 18  SpO2 99%  LMP 07/18/2012 Physical Exam  Constitutional: She is oriented to person, place, and time. She appears well-developed. No distress.  Appears older than stated age. She is undernourished  HENT:  Head: Normocephalic and atraumatic.  Right Ear: External ear normal.  Left Ear: External ear normal.  Eyes: Conjunctivae and EOM are normal. Pupils are equal, round, and reactive to light.  Neck: Normal range of motion and phonation normal. Neck supple.  Cardiovascular: Normal rate, regular rhythm and normal heart sounds.   Pulmonary/Chest: Effort normal and breath sounds normal. She exhibits no bony tenderness.  Abdominal: Soft. There is no tenderness.  Genitourinary:  Hanus has small, nonthrombosed hemorrhoidal tissue, without active bleeding. Normal anal sphincter tone. Small amount of stool, brown color, in rectum.  Musculoskeletal: Normal range of motion.  Neurological: She is alert and oriented to person, place, and time. No cranial nerve deficit or sensory deficit. She exhibits normal muscle tone. Coordination normal.  Skin: Skin is warm, dry and intact.  Psychiatric: She has a normal mood and affect. Her behavior is normal. Judgment and thought content  normal.  Nursing note and vitals reviewed.   ED Course  Procedures (including critical care time)  Medications  pantoprazole (PROTONIX) EC tablet 40 mg (not administered)  furosemide (LASIX) tablet 80 mg (not administered)  hydrALAZINE (APRESOLINE) tablet 25 mg (25 mg Oral Given 01/19/15 0005)  labetalol (NORMODYNE) tablet 400 mg (400 mg Oral Given 01/19/15 0004)  gabapentin (NEURONTIN) capsule 100 mg (100 mg Oral Given 01/19/15 0005)  ALPRAZolam (XANAX) tablet 0.5 mg (not administered)  atorvastatin (LIPITOR) tablet 20 mg (20 mg Oral Given 01/19/15 0006)  cloNIDine (CATAPRES) tablet 0.3 mg (0.3 mg Oral Given 01/19/15 0007)  ferrous sulfate tablet 325 mg (not administered)  hydroxychloroquine (PLAQUENIL) tablet 400 mg (not administered)  isosorbide mononitrate (IMDUR) 24 hr tablet 60 mg (not administered)  nicotine (NICODERM CQ - dosed in mg/24 hours) patch 14 mg (14 mg Transdermal Not Given 01/18/15 2300)  sodium bicarbonate tablet 1,300 mg (1,300 mg Oral Given 01/19/15 0007)  Vitamin D (Ergocalciferol) (DRISDOL) capsule 50,000 Units (not administered)  multivitamin (RENA-VIT) tablet 1 tablet (1 tablet Oral Given 01/19/15 0001)  mycophenolate (CELLCEPT) capsule 1,000 mg (1,000 mg Oral Given 01/19/15 0003)  amLODipine (NORVASC) tablet 10 mg (not administered)  sodium chloride 0.9 % injection 3 mL (3 mLs Intravenous Not Given 01/18/15 2215)  sodium chloride 0.9 % injection 3 mL (3 mLs Intravenous Given 01/18/15 2215)  sodium chloride 0.9 % injection 3 mL (not administered)  0.9 %  sodium chloride infusion (not administered)  oxyCODONE (Oxy IR/ROXICODONE) immediate release tablet 5 mg (not administered)  ondansetron (ZOFRAN) tablet 4 mg (not administered)    Or  ondansetron (ZOFRAN) injection 4 mg (not administered)  folic acid (FOLVITE) tablet 1 mg (not administered)  thiamine (VITAMIN B-1) tablet 100 mg (not administered)  hydrocortisone-pramoxine (PROCTOFOAM-HC) rectal foam 1 applicator (not  administered)  sodium chloride 0.9 % bolus 500 mL (0 mLs Intravenous Stopped 01/18/15 1851)  aspirin chewable tablet 324 mg (324 mg Oral Given 01/18/15 1945)    Patient Vitals for the past 24 hrs:  BP Temp Temp src Pulse Resp SpO2  01/19/15 0004 112/62 mmHg - - 92 - -  01/18/15  2216 (!) 98/56 mmHg 98.8 F (37.1 C) Oral (!) 106 18 99 %  01/18/15 2130 120/66 mmHg - - 114 19 100 %  01/18/15 2115 109/67 mmHg - - 113 14 100 %  01/18/15 2100 114/65 mmHg - - 113 19 100 %  01/18/15 2037 111/64 mmHg 99.2 F (37.3 C) Oral 115 17 100 %  01/18/15 2030 104/63 mmHg - - 113 25 100 %  01/18/15 2015 110/57 mmHg 98.8 F (37.1 C) Oral 115 19 100 %  01/18/15 2000 111/61 mmHg - - 116 19 100 %  01/18/15 1851 - - - - 18 100 %  01/18/15 1848 95/75 mmHg - - - 17 -  01/18/15 1830 102/64 mmHg - - 112 19 98 %  01/18/15 1824 - 100.2 F (37.9 C) Rectal - - -  01/18/15 1800 - - - 117 - 100 %  01/18/15 1751 96/63 mmHg - - 110 - 100 %  01/18/15 1746 90/58 mmHg 98.6 F (37 C) Oral - 19 100 %  01/18/15 1731 - - - - - 98 %    7:40 PM Reevaluation with update and discussion. After initial assessment and treatment, an updated evaluation reveals she is resting more comfortably now, she is supine. Oxygen saturation is 100% on 2 L nasal cannula oxygen. Blood pressure is 754 systolic. Findings discussed with patient, as well as family members were with her. All questions answered.Daleen Bo L   20:00- case discussed with cardiology, Dr. Harrington Challenger. She does not believe that the patient needs cardiology intervention at this time, and will be willing to see the patient on an as-needed basis, during or after treatment for anemia.  8:05 PM-Consult complete with hospitalist. Patient case explained and discussed. He agrees to admit patient for further evaluation and treatment. Call ended at 20:15  CRITICAL CARE Performed by: Richarda Blade Total critical care time: 45 minutes  Critical care time was exclusive of separately  billable procedures and treating other patients. Critical care was necessary to treat or prevent imminent or life-threatening deterioration. Critical care was time spent personally by me on the following activities: development of treatment plan with patient and/or surrogate as well as nursing, discussions with consultants, evaluation of patient's response to treatment, examination of patient, obtaining history from patient or surrogate, ordering and performing treatments and interventions, ordering and review of laboratory studies, ordering and review of radiographic studies, pulse oximetry and re-evaluation of patient's condition.    Labs Review Labs Reviewed  BASIC METABOLIC PANEL - Abnormal; Notable for the following:    Glucose, Bld 144 (*)    BUN 38 (*)    Creatinine, Ser 2.97 (*)    Calcium 6.6 (*)    GFR calc non Af Amer 17 (*)    GFR calc Af Amer 20 (*)    All other components within normal limits  CBC - Abnormal; Notable for the following:    WBC 13.3 (*)    RBC 2.67 (*)    Hemoglobin 7.1 (*)    HCT 22.4 (*)    All other components within normal limits  TROPONIN I - Abnormal; Notable for the following:    Troponin I 0.57 (*)    All other components within normal limits  I-STAT TROPOININ, ED - Abnormal; Notable for the following:    Troponin i, poc 0.33 (*)    All other components within normal limits  GLUCOSE, CAPILLARY  TSH  TROPONIN I  TROPONIN I  HEMOGLOBIN A1C  CBC  COMPREHENSIVE METABOLIC  PANEL  POC OCCULT BLOOD, ED  I-STAT CG4 LACTIC ACID, ED  TYPE AND SCREEN  PREPARE RBC (CROSSMATCH)    Imaging Review Dg Chest Portable 1 View  01/18/2015   CLINICAL DATA:  Acute onset of shortness of breath and diarrhea. Initial encounter.  EXAM: PORTABLE CHEST - 1 VIEW  COMPARISON:  Chest radiograph performed 11/22/2014  FINDINGS: The lungs are well-aerated and clear. There is no evidence of focal opacification, pleural effusion or pneumothorax.  The cardiomediastinal  silhouette is within normal limits. No acute osseous abnormalities are seen.  IMPRESSION: No acute cardiopulmonary process seen.   Electronically Signed   By: Garald Balding M.D.   On: 01/18/2015 19:13     EKG Interpretation   Date/Time:  Thursday January 18 2015 18:18:45 EDT Ventricular Rate:  114 PR Interval:  142 QRS Duration: 81 QT Interval:  481 QTC Calculation: 663 R Axis:   -10 Text Interpretation:  Sinus tachycardia Right atrial enlargement  Anteroseptal infarct, age indeterminate Prolonged QT interval Since last  tracing QT has lengthened Confirmed by Eulis Foster  MD, Jerardo Costabile [phone]) on  01/18/2015 7:38:56 PM      MDM   Final diagnoses:  NSTEMI (non-ST elevated myocardial infarction)  Anemia, unspecified anemia type  Renal insufficiency    Anemia, with increased workload, and troponin leak, all related to renal insufficiency. She will require admission for further treatment, and management.  Nursing Notes Reviewed/ Care Coordinated, and agree without changes. Applicable Imaging Reviewed.  Interpretation of Laboratory Data incorporated into ED treatment  Plan: Admit    Daleen Bo, MD 01/19/15 [phone]

## 2015-01-18 NOTE — ED Notes (Signed)
Dr. Wentz at bedside. 

## 2015-01-18 NOTE — ED Notes (Signed)
Per Dr Eulis Foster, administer blood over 2 hours.

## 2015-01-19 ENCOUNTER — Encounter (HOSPITAL_COMMUNITY): Payer: Managed Care, Other (non HMO)

## 2015-01-19 DIAGNOSIS — Z882 Allergy status to sulfonamides status: Secondary | ICD-10-CM | POA: Diagnosis present

## 2015-01-19 DIAGNOSIS — R509 Fever, unspecified: Secondary | ICD-10-CM | POA: Diagnosis not present

## 2015-01-19 DIAGNOSIS — J029 Acute pharyngitis, unspecified: Secondary | ICD-10-CM | POA: Diagnosis present

## 2015-01-19 DIAGNOSIS — R0600 Dyspnea, unspecified: Secondary | ICD-10-CM | POA: Diagnosis not present

## 2015-01-19 DIAGNOSIS — E785 Hyperlipidemia, unspecified: Secondary | ICD-10-CM | POA: Diagnosis present

## 2015-01-19 DIAGNOSIS — I5031 Acute diastolic (congestive) heart failure: Secondary | ICD-10-CM | POA: Diagnosis not present

## 2015-01-19 DIAGNOSIS — G9340 Encephalopathy, unspecified: Secondary | ICD-10-CM | POA: Diagnosis present

## 2015-01-19 DIAGNOSIS — R778 Other specified abnormalities of plasma proteins: Secondary | ICD-10-CM | POA: Diagnosis present

## 2015-01-19 DIAGNOSIS — M3214 Glomerular disease in systemic lupus erythematosus: Secondary | ICD-10-CM | POA: Diagnosis present

## 2015-01-19 DIAGNOSIS — J069 Acute upper respiratory infection, unspecified: Secondary | ICD-10-CM | POA: Diagnosis present

## 2015-01-19 DIAGNOSIS — E46 Unspecified protein-calorie malnutrition: Secondary | ICD-10-CM | POA: Diagnosis present

## 2015-01-19 DIAGNOSIS — J80 Acute respiratory distress syndrome: Secondary | ICD-10-CM | POA: Diagnosis present

## 2015-01-19 DIAGNOSIS — M1990 Unspecified osteoarthritis, unspecified site: Secondary | ICD-10-CM | POA: Diagnosis present

## 2015-01-19 DIAGNOSIS — Z87891 Personal history of nicotine dependence: Secondary | ICD-10-CM | POA: Diagnosis present

## 2015-01-19 DIAGNOSIS — Y92239 Unspecified place in hospital as the place of occurrence of the external cause: Secondary | ICD-10-CM | POA: Diagnosis not present

## 2015-01-19 DIAGNOSIS — R451 Restlessness and agitation: Secondary | ICD-10-CM | POA: Diagnosis present

## 2015-01-19 DIAGNOSIS — R7989 Other specified abnormal findings of blood chemistry: Secondary | ICD-10-CM

## 2015-01-19 DIAGNOSIS — Z79899 Other long term (current) drug therapy: Secondary | ICD-10-CM | POA: Diagnosis present

## 2015-01-19 DIAGNOSIS — N185 Chronic kidney disease, stage 5: Secondary | ICD-10-CM | POA: Diagnosis not present

## 2015-01-19 DIAGNOSIS — Y95 Nosocomial condition: Secondary | ICD-10-CM | POA: Diagnosis not present

## 2015-01-19 DIAGNOSIS — N179 Acute kidney failure, unspecified: Secondary | ICD-10-CM | POA: Diagnosis present

## 2015-01-19 DIAGNOSIS — Z791 Long term (current) use of non-steroidal anti-inflammatories (NSAID): Secondary | ICD-10-CM | POA: Diagnosis present

## 2015-01-19 DIAGNOSIS — R5381 Other malaise: Secondary | ICD-10-CM | POA: Diagnosis not present

## 2015-01-19 DIAGNOSIS — I129 Hypertensive chronic kidney disease with stage 1 through stage 4 chronic kidney disease, or unspecified chronic kidney disease: Secondary | ICD-10-CM | POA: Diagnosis present

## 2015-01-19 DIAGNOSIS — I9589 Other hypotension: Secondary | ICD-10-CM | POA: Diagnosis not present

## 2015-01-19 DIAGNOSIS — N189 Chronic kidney disease, unspecified: Secondary | ICD-10-CM | POA: Diagnosis not present

## 2015-01-19 DIAGNOSIS — R197 Diarrhea, unspecified: Secondary | ICD-10-CM

## 2015-01-19 DIAGNOSIS — L98499 Non-pressure chronic ulcer of skin of other sites with unspecified severity: Secondary | ICD-10-CM | POA: Diagnosis not present

## 2015-01-19 DIAGNOSIS — T43595A Adverse effect of other antipsychotics and neuroleptics, initial encounter: Secondary | ICD-10-CM | POA: Diagnosis not present

## 2015-01-19 DIAGNOSIS — J9601 Acute respiratory failure with hypoxia: Secondary | ICD-10-CM | POA: Diagnosis not present

## 2015-01-19 DIAGNOSIS — J189 Pneumonia, unspecified organism: Secondary | ICD-10-CM | POA: Diagnosis present

## 2015-01-19 DIAGNOSIS — N184 Chronic kidney disease, stage 4 (severe): Secondary | ICD-10-CM | POA: Diagnosis present

## 2015-01-19 DIAGNOSIS — Z8249 Family history of ischemic heart disease and other diseases of the circulatory system: Secondary | ICD-10-CM | POA: Diagnosis present

## 2015-01-19 DIAGNOSIS — E872 Acidosis: Secondary | ICD-10-CM | POA: Diagnosis present

## 2015-01-19 DIAGNOSIS — N186 End stage renal disease: Secondary | ICD-10-CM | POA: Diagnosis not present

## 2015-01-19 DIAGNOSIS — R0602 Shortness of breath: Secondary | ICD-10-CM | POA: Diagnosis present

## 2015-01-19 DIAGNOSIS — E871 Hypo-osmolality and hyponatremia: Secondary | ICD-10-CM | POA: Diagnosis present

## 2015-01-19 DIAGNOSIS — J9811 Atelectasis: Secondary | ICD-10-CM | POA: Diagnosis not present

## 2015-01-19 DIAGNOSIS — D649 Anemia, unspecified: Secondary | ICD-10-CM | POA: Diagnosis not present

## 2015-01-19 DIAGNOSIS — Z8673 Personal history of transient ischemic attack (TIA), and cerebral infarction without residual deficits: Secondary | ICD-10-CM | POA: Diagnosis present

## 2015-01-19 DIAGNOSIS — T448X6A Underdosing of centrally-acting and adrenergic-neuron-blocking agents, initial encounter: Secondary | ICD-10-CM | POA: Diagnosis present

## 2015-01-19 DIAGNOSIS — I471 Supraventricular tachycardia: Secondary | ICD-10-CM | POA: Diagnosis not present

## 2015-01-19 DIAGNOSIS — I959 Hypotension, unspecified: Secondary | ICD-10-CM | POA: Diagnosis present

## 2015-01-19 DIAGNOSIS — R531 Weakness: Secondary | ICD-10-CM | POA: Diagnosis not present

## 2015-01-19 DIAGNOSIS — Z992 Dependence on renal dialysis: Secondary | ICD-10-CM | POA: Diagnosis not present

## 2015-01-19 DIAGNOSIS — I340 Nonrheumatic mitral (valve) insufficiency: Secondary | ICD-10-CM | POA: Diagnosis present

## 2015-01-19 DIAGNOSIS — I10 Essential (primary) hypertension: Secondary | ICD-10-CM | POA: Diagnosis not present

## 2015-01-19 DIAGNOSIS — E11649 Type 2 diabetes mellitus with hypoglycemia without coma: Secondary | ICD-10-CM | POA: Diagnosis present

## 2015-01-19 DIAGNOSIS — I351 Nonrheumatic aortic (valve) insufficiency: Secondary | ICD-10-CM | POA: Diagnosis not present

## 2015-01-19 DIAGNOSIS — Z9119 Patient's noncompliance with other medical treatment and regimen: Secondary | ICD-10-CM | POA: Diagnosis present

## 2015-01-19 DIAGNOSIS — R410 Disorientation, unspecified: Secondary | ICD-10-CM | POA: Diagnosis not present

## 2015-01-19 DIAGNOSIS — F05 Delirium due to known physiological condition: Secondary | ICD-10-CM | POA: Diagnosis not present

## 2015-01-19 DIAGNOSIS — D638 Anemia in other chronic diseases classified elsewhere: Secondary | ICD-10-CM | POA: Diagnosis present

## 2015-01-19 DIAGNOSIS — Z833 Family history of diabetes mellitus: Secondary | ICD-10-CM | POA: Diagnosis present

## 2015-01-19 DIAGNOSIS — D62 Acute posthemorrhagic anemia: Secondary | ICD-10-CM | POA: Diagnosis present

## 2015-01-19 DIAGNOSIS — M329 Systemic lupus erythematosus, unspecified: Secondary | ICD-10-CM | POA: Diagnosis not present

## 2015-01-19 LAB — COMPREHENSIVE METABOLIC PANEL
ALT: 8 U/L (ref 0–35)
AST: 14 U/L (ref 0–37)
Albumin: 2.2 g/dL — ABNORMAL LOW (ref 3.5–5.2)
Alkaline Phosphatase: 58 U/L (ref 39–117)
Anion gap: 12 (ref 5–15)
BILIRUBIN TOTAL: 0.7 mg/dL (ref 0.3–1.2)
BUN: 38 mg/dL — AB (ref 6–23)
CHLORIDE: 107 mmol/L (ref 96–112)
CO2: 18 mmol/L — AB (ref 19–32)
CREATININE: 3.27 mg/dL — AB (ref 0.50–1.10)
Calcium: 6.4 mg/dL — CL (ref 8.4–10.5)
GFR calc Af Amer: 18 mL/min — ABNORMAL LOW (ref >90)
GFR calc non Af Amer: 15 mL/min — ABNORMAL LOW (ref >90)
Glucose, Bld: 82 mg/dL (ref 70–99)
Potassium: 3.9 mmol/L (ref 3.5–5.1)
Sodium: 137 mmol/L (ref 135–145)
Total Protein: 6.1 g/dL (ref 6.0–8.3)

## 2015-01-19 LAB — HEMOGLOBIN AND HEMATOCRIT, BLOOD
HCT: 29 % — ABNORMAL LOW (ref 36.0–46.0)
Hemoglobin: 9.4 g/dL — ABNORMAL LOW (ref 12.0–15.0)

## 2015-01-19 LAB — CBC
HCT: 26.6 % — ABNORMAL LOW (ref 36.0–46.0)
HEMOGLOBIN: 8.6 g/dL — AB (ref 12.0–15.0)
MCH: 26.7 pg (ref 26.0–34.0)
MCHC: 32.3 g/dL (ref 30.0–36.0)
MCV: 82.6 fL (ref 78.0–100.0)
Platelets: 215 10*3/uL (ref 150–400)
RBC: 3.22 MIL/uL — ABNORMAL LOW (ref 3.87–5.11)
RDW: 15.7 % — AB (ref 11.5–15.5)
WBC: 8.3 10*3/uL (ref 4.0–10.5)

## 2015-01-19 LAB — INFLUENZA PANEL BY PCR (TYPE A & B)
H1N1FLUPCR: NOT DETECTED
Influenza A By PCR: NEGATIVE
Influenza B By PCR: NEGATIVE

## 2015-01-19 LAB — TSH: TSH: 1.149 u[IU]/mL (ref 0.350–4.500)

## 2015-01-19 LAB — TROPONIN I
Troponin I: 0.44 ng/mL — ABNORMAL HIGH (ref <0.031)
Troponin I: 0.38 ng/mL — ABNORMAL HIGH (ref <0.031)

## 2015-01-19 LAB — GLUCOSE, CAPILLARY: Glucose-Capillary: 88 mg/dL (ref 70–99)

## 2015-01-19 MED ORDER — DARBEPOETIN ALFA 25 MCG/0.42ML IJ SOSY
25.0000 ug | PREFILLED_SYRINGE | Freq: Once | INTRAMUSCULAR | Status: AC
  Administered 2015-01-19: 25 ug via SUBCUTANEOUS
  Filled 2015-01-19: qty 0.42

## 2015-01-19 MED ORDER — MENTHOL 3 MG MT LOZG
1.0000 | LOZENGE | OROMUCOSAL | Status: DC | PRN
  Filled 2015-01-19: qty 9

## 2015-01-19 MED ORDER — SODIUM CHLORIDE 0.9 % IV SOLN
INTRAVENOUS | Status: DC
  Administered 2015-01-19 – 2015-01-20 (×3): via INTRAVENOUS

## 2015-01-19 NOTE — Progress Notes (Signed)
New Admission Note:  Arrival Method: Via stretcher with RN, transported with blood transfusing Mental Orientation: Alert and orientedx4 Telemetry: Placed on box 16, CCMD notified  Assessment: Completed Skin: Pressure ulcer x2 (on left buttocks),  assessed with Volney Presser, RN IV: Right wrist, Left AC Pain: No complains of pain Tubes: n/a Admission: Completed [address] Orientation: Patient has been orientated to the room, unit and the staff. Family: None at bedside  Orders have been reviewed and implemented. Will continue to monitor the patient. Call light has been placed within reach and bed alarm has been activated.   Leandro Reasoner BSN, RN  Phone Number: [phone] [facility]

## 2015-01-19 NOTE — Progress Notes (Signed)
Notified MD Conley Canal of patient's low blood pressure. Told to hold am bp meds.

## 2015-01-19 NOTE — Progress Notes (Signed)
Pharmacy consulted to dose Aranesp for anemia of chronic disease.  Patient has history of Lupus and CKD stage 4.  She is on oral iron as an outpatient. Iron panel from 01/05/15 are acceptable to start ESA therapy. Her Tsat was 68 and ferritin was 1206.  Dosing for Aranesp in CKD patient not on dialysis is a starting dose of 0.43mcg/kg q4 weeks. Based on weight of 57kg, patient's dose is calculated to be 2mcg q4weeks. Since she is not on HD, this will be given subQ.  Hgb on admit was 7.1. Noted she received a transfusion and Hgb today is still low at 8.6. Since Hgb is <10, it is appropriate to start an ESA.  Plan: -Aranesp 63mcg subQ x1 tonight -future doses would be based on her anemia workup and any input from renal -recommend CBC 1 week after discharge. Will take a few weeks to see full effects of ESA dose -recommend patient continue taking oral iron supplements -pharmacy to sign off as no future doses likely required this admission. Please reconsult if needed.  Evren Shankland D. Marva Hendryx, PharmD, BCPS Clinical Pharmacist Pager: [phone] 01/19/2015 4:39 PM

## 2015-01-19 NOTE — Progress Notes (Signed)
UR completed 

## 2015-01-19 NOTE — Progress Notes (Signed)
Notified NP on call about troponin level of 0.57. Patient sweaty, no complains of chest pain. Blood pressure =112/62, HR=92. No orders received. Will continue to monitor patient.

## 2015-01-19 NOTE — Evaluation (Signed)
Physical Therapy Evaluation Patient Details Name: [name] [name] MRN: [id number] DOB: [birth year] Today's Date: 01/19/2015   History of Present Illness  Patient is a 54 y/o female admitted with SOB, cough and sore throat. In ED, found to have anemia as well as minimally elevated troponin (trending down). PMH of CKD, lupus, HTN, DM and CVA.    Clinical Impression  Patient presents with generalized weakness, deconditioning and mild balance deficits impacting mobility. Tolerated ambulation within room with supervision and use of IV pole for support. Anticipate mobility will improve with increased activity. Pt would benefit from skilled PT to improve transfers, gait, balance and mobility so pt can maximize independence and return to PLOF.    Follow Up Recommendations Home health PT;Supervision - Intermittent    Equipment Recommendations  None recommended by PT    Recommendations for Other Services OT consult     Precautions / Restrictions Precautions Precautions: None Precaution Comments: Droplet and enteric precautions Restrictions Weight Bearing Restrictions: No      Mobility  Bed Mobility Overal bed mobility: Modified Independent                Transfers Overall transfer level: Needs assistance Equipment used: None Transfers: Sit to/from Stand Sit to Stand: Supervision         General transfer comment: Min guard for safety. Use of IV pole for support. Stood from [employer], from toilet x1.   Ambulation/Gait Ambulation/Gait assistance: Supervision Ambulation Distance (Feet): 50 Feet Assistive device:  (IV pole) Gait Pattern/deviations: Step-through pattern;Decreased stride length   Gait velocity interpretation: Below normal speed for age/gender General Gait Details: Mild unsteadiness noted during gait however no LOB. use of IV pole to simulate SPC. Ambulated within room and to/from bathroom. Dyspnea present. HR 112 bpm.  Stairs            Wheelchair  Mobility    Modified Rankin (Stroke Patients Only)       Balance Overall balance assessment: Needs assistance Sitting-balance support: Feet supported;No upper extremity supported Sitting balance-Leahy Scale: Good     Standing balance support: During functional activity Standing balance-Leahy Scale: Fair Standing balance comment: Tolerated dynamic standing at the sink washing hands without UE support.                              Pertinent Vitals/Pain Pain Assessment: No/denies pain    Home Living Family/patient expects to be discharged to:: Private residence Living Arrangements: Children Available Help at Discharge: Available 24 hours/day;Family Type of Home: Apartment Home Access: Stairs to enter Entrance Stairs-Rails: Right Entrance Stairs-Number of Steps: 4 Home Layout: Two level;Able to live on main level with bedroom/bathroom Home Equipment: Gilford Rile - 2 wheels;Cane - single point;Shower seat;Bedside commode Additional Comments: Pt reports she receives HHOT and PT    Prior Function Level of Independence: Independent with assistive device(s)         Comments: Pt reports on her bad days she uses the cane or the RW.       Hand Dominance   Dominant Hand: Right    Extremity/Trunk Assessment   Upper Extremity Assessment: Defer to OT evaluation           Lower Extremity Assessment: Generalized weakness         Communication   Communication: No difficulties  Cognition Arousal/Alertness: Awake/alert Behavior During Therapy: WFL for tasks assessed/performed Overall Cognitive Status: Within Functional Limits for tasks assessed  General Comments      Exercises        Assessment/Plan    PT Assessment Patient needs continued PT services  PT Diagnosis Generalized weakness   PT Problem List Decreased strength;Cardiopulmonary status limiting activity;Decreased activity tolerance;Decreased balance;Decreased  mobility  PT Treatment Interventions Balance training;Gait training;Stair training;Patient/family education;Functional mobility training;Therapeutic activities;Therapeutic exercise   PT Goals (Current goals can be found in the Care Plan section) Acute Rehab PT Goals Patient Stated Goal: none stated PT Goal Formulation: With patient Time For Goal Achievement: 02/02/15 Potential to Achieve Goals: Good    Frequency Min 3X/week   Barriers to discharge        Co-evaluation               End of Session Equipment Utilized During Treatment: Gait belt Activity Tolerance: Patient tolerated treatment well Patient left: in bed;with call bell/phone within reach;with bed alarm set Nurse Communication: Mobility status         Time: 1555-1610 PT Time Calculation (min) (ACUTE ONLY): 15 min   Charges:   PT Evaluation $Initial PT Evaluation Tier I: 1 Procedure     PT G CodesCandy Sledge A 2015/01/31, 4:21 PM Candy Sledge, [place], DPT [phone]

## 2015-01-19 NOTE — Progress Notes (Signed)
[facility] PROGRESS NOTE  [name] [name] NWG:[id number] DOB: June 06, 1961 DOA: 01/18/2015 PCP: Lamar Blinks, MD  Assessment/Plan:  Principal Problem:   Weakness, multifactorial, secondary to anemia, diarrhea, weight loss, relative hypotension, URI. Active Problems:   Anemia: stool heme negative. Acute on chronic. Weakness improved after transfusion. CKD, chronic disease, lupus. Recently worked up   Hypotension: SBP 89. Hold antihypertensives. No evidence of sepsis. May be volume depletion. Has had diarrhea after food for 3 weeks, lost 20 lbs. Last prednisone only 7 days, doubt adrenal crisis.    Diarrhea:  Report multiple loose stools particularly after eating for 3 weeks. Check GI pathogen panel. On immunosuppresants, so at risk for infection. Could also be cellcept. If infection ruled out   Systemic lupus erythematosus: on cellcept and plaquenil.   Essential hypertension   CKD (chronic kidney disease) stage 4, GFR 15-29 ml/min   Shortness of breath   Elevated troponin level: will check echo. Last echo with normal EF URI: flu neg.   HPI/Subjective: Feels better after transfusion. No n/v losing weight. No bleeding. Loose stools for 3 weeks. No CP  Objective: Filed Vitals:   01/19/15 1835  BP: 121/56  Pulse: 106  Temp: 98.5 F (36.9 C)  Resp: 18    Intake/Output Summary (Last 24 hours) at 01/19/15 2035 Last data filed at 01/19/15 1900  Gross per 24 hour  Intake 1262.83 ml  Output      0 ml  Net 1262.83 ml   There were no vitals filed for this visit.  Exam:   General:  Chronically ill appearing. A and o  Cardiovascular: RRR  Respiratory: CTA without WRR  Abdomen: S, NT, ND  Ext: no CCE  Basic Metabolic Panel:  Recent Labs Lab 01/18/15 1840 01/19/15 0500  NA 135 137  K 4.3 3.9  CL 105 107  CO2 20 18*  GLUCOSE 144* 82  BUN 38* 38*  CREATININE 2.97* 3.27*  CALCIUM 6.6* 6.4*   Liver Function Tests:  Recent Labs Lab 01/19/15 0500  AST 14   ALT 8  ALKPHOS 58  BILITOT 0.7  PROT 6.1  ALBUMIN 2.2*   No results for input(s): LIPASE, AMYLASE in the last 168 hours. No results for input(s): AMMONIA in the last 168 hours. CBC:  Recent Labs Lab 01/18/15 1840 01/19/15 0153 01/19/15 0500  WBC 13.3*  --  8.3  HGB 7.1* 9.4* 8.6*  HCT 22.4* 29.0* 26.6*  MCV 83.9  --  82.6  PLT 279  --  215   Cardiac Enzymes:  Recent Labs Lab 01/18/15 2255 01/19/15 0500 01/19/15 0956  TROPONINI 0.57* 0.44* 0.38*   BNP (last 3 results) No results for input(s): BNP in the last 8760 hours.  ProBNP (last 3 results)  Recent Labs  10/05/14 0521  PROBNP 20673.0*    CBG:  Recent Labs Lab 01/18/15 2221 01/19/15 0757  GLUCAP 96 88    No results found for this or any previous visit (from the past 240 hour(s)).   Studies: Dg Chest Portable 1 View  01/18/2015   CLINICAL DATA:  Acute onset of shortness of breath and diarrhea. Initial encounter.  EXAM: PORTABLE CHEST - 1 VIEW  COMPARISON:  Chest radiograph performed 11/22/2014  FINDINGS: The lungs are well-aerated and clear. There is no evidence of focal opacification, pleural effusion or pneumothorax.  The cardiomediastinal silhouette is within normal limits. No acute osseous abnormalities are seen.  IMPRESSION: No acute cardiopulmonary process seen.   Electronically Signed   By: Garald Balding  M.D.   On: 01/18/2015 19:13    Scheduled Meds: . atorvastatin  20 mg Oral q1800  . ferrous sulfate  325 mg Oral BID WC  . folic acid  1 mg Oral Daily  . gabapentin  100 mg Oral QHS  . hydrocortisone-pramoxine  1 applicator Rectal BID  . hydroxychloroquine  400 mg Oral Daily  . multivitamin  1 tablet Oral QHS  . mycophenolate  1,000 mg Oral BID  . nicotine  14 mg Transdermal Q24H  . pantoprazole  40 mg Oral Daily  . sodium bicarbonate  1,300 mg Oral BID  . sodium chloride  3 mL Intravenous Q12H  . sodium chloride  3 mL Intravenous Q12H  . thiamine  100 mg Oral Daily  . [START ON  01/24/2015] Vitamin D (Ergocalciferol)  50,000 Units Oral Q Wed   Continuous Infusions: . sodium chloride 50 mL/hr at 01/19/15 1753    Time spent: 35 minutes  [facility]  [email or web address], password [facility] 01/19/2015, 8:35 PM  LOS: 0 days

## 2015-01-19 NOTE — Progress Notes (Signed)
CRITICAL VALUE ALERT  Critical value received:  Ca 6.4  Date of notification:  01/19/15  Time of notification:  0720  Critical value read back:Yes.    Nurse who received alert:  Francena Hanly  MD notified (1st page):  MD Humphrey Rolls  Time of first page:  7:54  MD notified (2nd page): MD Humphrey Rolls  Time of second page: 8:am  MD notified (3rd page): MD Conley Canal  Responding MD:  MD Conley Canal  Time MD responded:  9:45

## 2015-01-20 DIAGNOSIS — R0600 Dyspnea, unspecified: Secondary | ICD-10-CM

## 2015-01-20 LAB — RENAL FUNCTION PANEL
Albumin: 2.3 g/dL — ABNORMAL LOW (ref 3.5–5.2)
Anion gap: 9 (ref 5–15)
BUN: 39 mg/dL — ABNORMAL HIGH (ref 6–23)
CHLORIDE: 105 mmol/L (ref 96–112)
CO2: 21 mmol/L (ref 19–32)
Calcium: 6.2 mg/dL — CL (ref 8.4–10.5)
Creatinine, Ser: 3.29 mg/dL — ABNORMAL HIGH (ref 0.50–1.10)
GFR, EST AFRICAN AMERICAN: 17 mL/min — AB (ref >90)
GFR, EST NON AFRICAN AMERICAN: 15 mL/min — AB (ref >90)
Glucose, Bld: 90 mg/dL (ref 70–99)
Phosphorus: 4.2 mg/dL (ref 2.3–4.6)
Potassium: 3.9 mmol/L (ref 3.5–5.1)
Sodium: 135 mmol/L (ref 135–145)

## 2015-01-20 LAB — HEMOGLOBIN AND HEMATOCRIT, BLOOD
HCT: 23.4 % — ABNORMAL LOW (ref 36.0–46.0)
Hemoglobin: 7.4 g/dL — ABNORMAL LOW (ref 12.0–15.0)

## 2015-01-20 LAB — CALCIUM, IONIZED: Calcium, Ion: 0.85 mmol/L — ABNORMAL LOW (ref 1.12–1.32)

## 2015-01-20 LAB — GLUCOSE, CAPILLARY: GLUCOSE-CAPILLARY: 79 mg/dL (ref 70–99)

## 2015-01-20 MED ORDER — AZITHROMYCIN 250 MG PO TABS
250.0000 mg | ORAL_TABLET | Freq: Every day | ORAL | Status: DC
  Filled 2015-01-20: qty 1

## 2015-01-20 MED ORDER — AZITHROMYCIN 500 MG PO TABS
500.0000 mg | ORAL_TABLET | Freq: Every day | ORAL | Status: AC
  Administered 2015-01-20: 500 mg via ORAL
  Filled 2015-01-20: qty 1

## 2015-01-20 MED ORDER — SODIUM CHLORIDE 0.9 % IV SOLN
1.0000 g | Freq: Once | INTRAVENOUS | Status: AC
  Administered 2015-01-20: 1 g via INTRAVENOUS
  Filled 2015-01-20: qty 10

## 2015-01-20 NOTE — Progress Notes (Signed)
INITIAL NUTRITION ASSESSMENT  DOCUMENTATION CODES Per approved criteria  -Severe malnutrition in the context of chronic illness   INTERVENTION: 1.  Meals/snacks; patient agrees to snacks BID between meals. 2.  Nutrition-related medications; suspect some degree of weight loss is attributable to dumping syndrome experienced after eating.  RD notes investigation of Cellcept as cause of diarrhea by medical providers.  3. Modify diet; consider diet liberalization to Regular and allow patient to choose foods best tolerated at this time.    NUTRITION DIAGNOSIS: Altered GI function; diarrhea related to possible medication side effect as evidenced by patient reports frequent and consistent diarrhea x4-5 months.   Monitor:  1.  Food/Beverage; pt meeting >/=90% estimated needs with tolerance. 2.  Wt/wt change; monitor trends 3.  Gastrointestinal; pt to have normal bowel function.   Reason for Assessment: MST, consult  54 y.o. female  Admitting Dx: Weakness  ASSESSMENT: Patient admitted with weakness and recent wt loss.  RD consulted for nutrition assessment.    RD met with patient who reports she has had frequent diarrhea since starting Cellcept in November.  Patient states that she has diarrhea after every meal within 45 minutes after eating.  Patient states that she has been eating less to try and prevent the diarrhea, however it is not helping and ultimately she has lost weight.  Patient states that she has started eating more refined carbohydrates such as bagels and rice to try and help the diarrhea with minimal improvement.  Patient reports 20 lbs of weight loss which is consistent with chart review.  Pt last weighed 145 lbs in early December.    Encouraged small, frequent meals and snacks.  Will order patients snacks BID to be sent from foodservice.  Pt meets criteria for severe MALNUTRITION in the context of chronic illness as evidenced by >7.5% weight loss in >3 months, with decreased  intake insufficient to meet 75% of estimated need for >1 month.  Suspect some degree of malabsorption.   Height: Ht Readings from Last 1 Encounters:  12/07/14 $RemoveB'5\' 5"'[id number]$  (1.651 m)    Weight: Wt Readings from Last 1 Encounters:  01/19/15 124 lb 11.2 oz (56.564 kg)    Ideal Body Weight: 125 lbs  % Ideal Body Weight: 99%  Wt Readings from Last 10 Encounters:  01/19/15 124 lb 11.2 oz (56.564 kg)  12/07/14 127 lb (57.607 kg)  11/25/14 142 lb 3.2 oz (64.5 kg)  11/10/14 131 lb (59.421 kg)  11/06/14 149 lb (67.586 kg)  10/25/14 149 lb (67.586 kg)  10/17/14 156 lb 14.4 oz (71.169 kg)  10/05/14 146 lb 9.7 oz (66.5 kg)  09/28/14 142 lb 6.7 oz (64.6 kg)  09/04/12 135 lb (61.236 kg)    Usual Body Weight: 145 lbs  % Usual Body Weight: 86%  BMI:  Body mass index is 20.75 kg/(m^2).  Estimated Nutritional Needs: Kcal: 1800-2000 Protein: 65-85g Fluid: >2.0 L/day  Skin: intact  Diet Order: Diet heart healthy/carb modified  EDUCATION NEEDS: -Education needs addressed   Intake/Output Summary (Last 24 hours) at 01/20/15 1312 Last data filed at 01/20/15 0900  Gross per 24 hour  Intake 995.83 ml  Output      0 ml  Net 995.83 ml    Last BM: 3/19, diarrhea   Labs:   Recent Labs Lab 01/18/15 1840 01/19/15 0500 01/20/15 0427  NA 135 137 135  K 4.3 3.9 3.9  CL 105 107 105  CO2 20 18* 21  BUN 38* 38* 39*  CREATININE 2.97* 3.27*  3.29*  CALCIUM 6.6* 6.4* 6.2*  PHOS  --   --  4.2  GLUCOSE 144* 82 90    CBG (last 3)   Recent Labs  01/18/15 2221 01/19/15 0757 01/20/15 0808  GLUCAP 96 88 79    Scheduled Meds: . atorvastatin  20 mg Oral q1800  . [START ON 01/21/2015] azithromycin  250 mg Oral Daily  . ferrous sulfate  325 mg Oral BID WC  . folic acid  1 mg Oral Daily  . gabapentin  100 mg Oral QHS  . hydrocortisone-pramoxine  1 applicator Rectal BID  . hydroxychloroquine  400 mg Oral Daily  . multivitamin  1 tablet Oral QHS  . mycophenolate  1,000 mg Oral BID  .  nicotine  14 mg Transdermal Q24H  . pantoprazole  40 mg Oral Daily  . sodium bicarbonate  1,300 mg Oral BID  . sodium chloride  3 mL Intravenous Q12H  . sodium chloride  3 mL Intravenous Q12H  . thiamine  100 mg Oral Daily  . [START ON 01/24/2015] Vitamin D (Ergocalciferol)  50,000 Units Oral Q Wed    Continuous Infusions: . sodium chloride 50 mL/hr at 01/20/15 0202    Past Medical History  Diagnosis Date  . Hypertension   . Lupus   . CKD (chronic kidney disease)     due to lupus/Dr. [name]  . Diabetes mellitus   . Stenosis of cervical spine region     with HNP at C5/6, C6/7  . Metatarsal bone fracture right    4th  . 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 Arthritis     RA  . Anemia   . Stroke 11/2014    left sided weakness, dysphagia  . Pain in joints   . Paresthesias   . Hyperlipidemia   . Colitis     ? per hospital notes 11/2014  . Lower GI bleed   . Hyponatremia   . Metabolic acidosis   . Hyperplastic colon polyp   . Hemorrhoids   . Blood transfusion without reported diagnosis     Past Surgical History  Procedure Laterality Date  . Breast biopsy    . Tubal ligation      Brynda Greathouse, MS RD LDN Clinical Inpatient Dietitian Weekend/After hours pager: [phone]

## 2015-01-20 NOTE — Progress Notes (Signed)
Echocardiogram 2D Echocardiogram has been performed.  [name] [name] 01/20/2015, 12:15 PM

## 2015-01-20 NOTE — Progress Notes (Signed)
CRITICAL VALUE ALERT  Critical value received:  Ca 6.2  Date of notification:  01/20/15  Time of notification:  4235  Critical value read back:Yes.    Nurse who received alert:  Shelbie Hutching  MD notified (1st page):  M. Pearce  Time of first page:  0550  MD notified (2nd page): M. Bistline  Time of second page: [phone]  Responding MD: M. Donnal Debar  Time MD responded:  [phone]

## 2015-01-20 NOTE — Progress Notes (Signed)
[facility] PROGRESS NOTE  [name] [name] MNO:[id number] DOB: 1961-07-25 DOA: 01/18/2015 PCP: Lamar Blinks, MD  Assessment/Plan:    Weakness, multifactorial, secondary to anemia, diarrhea, weight loss, relative hypotension, URI.    Anemia of chronic disease: stool heme negative. Acute on chronic. Weakness improved after transfusion. CKD, chronic disease, lupus. Recently worked up   Hypotension: SBP 89. Hold antihypertensives. No evidence of sepsis. May be volume depletion. Has had diarrhea after food for 3 weeks, lost 20 lbs. Last prednisone only 7 days, doubt adrenal crisis.    Diarrhea:  Report multiple loose stools particularly after eating for 3 weeks. Check GI pathogen panel. On immunosuppresants, so at risk for infection. Could also be cellcept. If infection ruled out   Systemic lupus erythematosus: on cellcept and plaquenil.   Essential hypertension   CKD (chronic kidney disease) stage 4, GFR 15-29 ml/min 2.8-3.2 baseline   Shortness of breath   Elevated troponin level: will check echo. Last echo with normal EF URI: flu neg.   HPI/Subjective: C/o pain in her hand  Objective: Filed Vitals:   01/20/15 0554  BP: 145/77  Pulse: 114  Temp: 98.6 F (37 C)  Resp: 19    Intake/Output Summary (Last 24 hours) at 01/20/15 1022 Last data filed at 01/20/15 0900  Gross per 24 hour  Intake 995.83 ml  Output      0 ml  Net 995.83 ml   Filed Weights   01/19/15 2127  Weight: 56.564 kg (124 lb 11.2 oz)    Exam:   General:  Chronically ill appearing. A and o  Cardiovascular: RRR  Respiratory: CTA without WRR  Abdomen: S, NT, ND  Ext: no CCE  Basic Metabolic Panel:  Recent Labs Lab 01/18/15 1840 01/19/15 0500 01/20/15 0427  NA 135 137 135  K 4.3 3.9 3.9  CL 105 107 105  CO2 20 18* 21  GLUCOSE 144* 82 90  BUN 38* 38* 39*  CREATININE 2.97* 3.27* 3.29*  CALCIUM 6.6* 6.4* 6.2*  PHOS  --   --  4.2   Liver Function Tests:  Recent Labs Lab  01/19/15 0500 01/20/15 0427  AST 14  --   ALT 8  --   ALKPHOS 58  --   BILITOT 0.7  --   PROT 6.1  --   ALBUMIN 2.2* 2.3*   No results for input(s): LIPASE, AMYLASE in the last 168 hours. No results for input(s): AMMONIA in the last 168 hours. CBC:  Recent Labs Lab 01/18/15 1840 01/19/15 0153 01/19/15 0500 01/20/15 0427  WBC 13.3*  --  8.3  --   HGB 7.1* 9.4* 8.6* 7.4*  HCT 22.4* 29.0* 26.6* 23.4*  MCV 83.9  --  82.6  --   PLT 279  --  215  --    Cardiac Enzymes:  Recent Labs Lab 01/18/15 2255 01/19/15 0500 01/19/15 0956  TROPONINI 0.57* 0.44* 0.38*   BNP (last 3 results) No results for input(s): BNP in the last 8760 hours.  ProBNP (last 3 results)  Recent Labs  10/05/14 0521  PROBNP 20673.0*    CBG:  Recent Labs Lab 01/18/15 2221 01/19/15 0757 01/20/15 0808  GLUCAP 96 88 79    No results found for this or any previous visit (from the past 240 hour(s)).   Studies: Dg Chest Portable 1 View  01/18/2015   CLINICAL DATA:  Acute onset of shortness of breath and diarrhea. Initial encounter.  EXAM: PORTABLE CHEST - 1 VIEW  COMPARISON:  Chest radiograph performed  11/22/2014  FINDINGS: The lungs are well-aerated and clear. There is no evidence of focal opacification, pleural effusion or pneumothorax.  The cardiomediastinal silhouette is within normal limits. No acute osseous abnormalities are seen.  IMPRESSION: No acute cardiopulmonary process seen.   Electronically Signed   By: Garald Balding M.D.   On: 01/18/2015 19:13    Scheduled Meds: . atorvastatin  20 mg Oral q1800  . [START ON 01/21/2015] azithromycin  250 mg Oral Daily  . ferrous sulfate  325 mg Oral BID WC  . folic acid  1 mg Oral Daily  . gabapentin  100 mg Oral QHS  . hydrocortisone-pramoxine  1 applicator Rectal BID  . hydroxychloroquine  400 mg Oral Daily  . multivitamin  1 tablet Oral QHS  . mycophenolate  1,000 mg Oral BID  . nicotine  14 mg Transdermal Q24H  . pantoprazole  40 mg Oral  Daily  . sodium bicarbonate  1,300 mg Oral BID  . sodium chloride  3 mL Intravenous Q12H  . sodium chloride  3 mL Intravenous Q12H  . thiamine  100 mg Oral Daily  . [START ON 01/24/2015] Vitamin D (Ergocalciferol)  50,000 Units Oral Q Wed   Continuous Infusions: . sodium chloride 50 mL/hr at 01/20/15 0202    Time spent: 35 minutes  VANN, JESSICA  [facility]  [email or web address], password [facility] 01/20/2015, 10:22 AM  LOS: 1 day

## 2015-01-21 ENCOUNTER — Inpatient Hospital Stay (HOSPITAL_COMMUNITY): Payer: Managed Care, Other (non HMO)

## 2015-01-21 DIAGNOSIS — R509 Fever, unspecified: Secondary | ICD-10-CM

## 2015-01-21 DIAGNOSIS — I9589 Other hypotension: Secondary | ICD-10-CM

## 2015-01-21 DIAGNOSIS — M329 Systemic lupus erythematosus, unspecified: Secondary | ICD-10-CM

## 2015-01-21 DIAGNOSIS — R000 Tachycardia, unspecified: Secondary | ICD-10-CM | POA: Diagnosis not present

## 2015-01-21 DIAGNOSIS — I351 Nonrheumatic aortic (valve) insufficiency: Secondary | ICD-10-CM

## 2015-01-21 LAB — POCT I-STAT 3, ART BLOOD GAS (G3+)
ACID-BASE DEFICIT: 12 mmol/L — AB (ref 0.0–2.0)
BICARBONATE: 13.8 meq/L — AB (ref 20.0–24.0)
O2 SAT: 99 %
PH ART: 7.269 — AB (ref 7.350–7.450)
PO2 ART: 154 mmHg — AB (ref 80.0–100.0)
TCO2: 15 mmol/L (ref 0–100)
pCO2 arterial: 30.6 mmHg — ABNORMAL LOW (ref 35.0–45.0)

## 2015-01-21 LAB — URINALYSIS, ROUTINE W REFLEX MICROSCOPIC
BILIRUBIN URINE: NEGATIVE
GLUCOSE, UA: NEGATIVE mg/dL
KETONES UR: 15 mg/dL — AB
Leukocytes, UA: NEGATIVE
NITRITE: NEGATIVE
SPECIFIC GRAVITY, URINE: 1.019 (ref 1.005–1.030)
Urobilinogen, UA: 0.2 mg/dL (ref 0.0–1.0)
pH: 5.5 (ref 5.0–8.0)

## 2015-01-21 LAB — HEPATIC FUNCTION PANEL
ALT: 7 U/L (ref 0–35)
AST: 17 U/L (ref 0–37)
Albumin: 2.3 g/dL — ABNORMAL LOW (ref 3.5–5.2)
Alkaline Phosphatase: 63 U/L (ref 39–117)
Bilirubin, Direct: 0.1 mg/dL (ref 0.0–0.5)
Indirect Bilirubin: 0.5 mg/dL (ref 0.3–0.9)
Total Bilirubin: 0.6 mg/dL (ref 0.3–1.2)
Total Protein: 5.9 g/dL — ABNORMAL LOW (ref 6.0–8.3)

## 2015-01-21 LAB — BLOOD GAS, ARTERIAL
Acid-base deficit: 10.6 mmol/L — ABNORMAL HIGH (ref 0.0–2.0)
Bicarbonate: 14 mEq/L — ABNORMAL LOW (ref 20.0–24.0)
DRAWN BY: 27022
FIO2: 1 %
O2 SAT: 92.2 %
PATIENT TEMPERATURE: 100.7
TCO2: 14.8 mmol/L (ref 0–100)
pCO2 arterial: 27.8 mmHg — ABNORMAL LOW (ref 35.0–45.0)
pH, Arterial: 7.329 — ABNORMAL LOW (ref 7.350–7.450)
pO2, Arterial: 77 mmHg — ABNORMAL LOW (ref 80.0–100.0)

## 2015-01-21 LAB — BASIC METABOLIC PANEL
Anion gap: 16 — ABNORMAL HIGH (ref 5–15)
BUN: 32 mg/dL — AB (ref 6–23)
CALCIUM: 6.8 mg/dL — AB (ref 8.4–10.5)
CO2: 14 mmol/L — AB (ref 19–32)
Chloride: 104 mmol/L (ref 96–112)
Creatinine, Ser: 2.73 mg/dL — ABNORMAL HIGH (ref 0.50–1.10)
GFR, EST AFRICAN AMERICAN: 22 mL/min — AB (ref >90)
GFR, EST NON AFRICAN AMERICAN: 19 mL/min — AB (ref >90)
Glucose, Bld: 86 mg/dL (ref 70–99)
Potassium: 3.9 mmol/L (ref 3.5–5.1)
SODIUM: 134 mmol/L — AB (ref 135–145)

## 2015-01-21 LAB — HEPARIN LEVEL (UNFRACTIONATED): Heparin Unfractionated: 0.88 IU/mL — ABNORMAL HIGH (ref 0.30–0.70)

## 2015-01-21 LAB — CBC
HCT: 26.1 % — ABNORMAL LOW (ref 36.0–46.0)
Hemoglobin: 8.6 g/dL — ABNORMAL LOW (ref 12.0–15.0)
MCH: 27 pg (ref 26.0–34.0)
MCHC: 33 g/dL (ref 30.0–36.0)
MCV: 82.1 fL (ref 78.0–100.0)
PLATELETS: 265 10*3/uL (ref 150–400)
RBC: 3.18 MIL/uL — AB (ref 3.87–5.11)
RDW: 15.4 % (ref 11.5–15.5)
WBC: 12.3 10*3/uL — AB (ref 4.0–10.5)

## 2015-01-21 LAB — TROPONIN I: Troponin I: 0.65 ng/mL (ref <0.031)

## 2015-01-21 LAB — LACTIC ACID, PLASMA: Lactic Acid, Venous: 0.9 mmol/L (ref 0.5–2.0)

## 2015-01-21 LAB — AMYLASE: Amylase: 98 U/L (ref 0–105)

## 2015-01-21 LAB — MRSA PCR SCREENING: MRSA by PCR: NEGATIVE

## 2015-01-21 LAB — GLUCOSE, CAPILLARY
GLUCOSE-CAPILLARY: 78 mg/dL (ref 70–99)
GLUCOSE-CAPILLARY: 91 mg/dL (ref 70–99)
Glucose-Capillary: 135 mg/dL — ABNORMAL HIGH (ref 70–99)
Glucose-Capillary: 100 mg/dL — ABNORMAL HIGH (ref 70–99)
Glucose-Capillary: 115 mg/dL — ABNORMAL HIGH (ref 70–99)

## 2015-01-21 LAB — URINE MICROSCOPIC-ADD ON

## 2015-01-21 LAB — PROCALCITONIN: PROCALCITONIN: 1.12 ng/mL

## 2015-01-21 LAB — LIPASE, BLOOD: Lipase: 18 U/L (ref 11–59)

## 2015-01-21 MED ORDER — MORPHINE SULFATE 2 MG/ML IJ SOLN
INTRAMUSCULAR | Status: AC
  Administered 2015-01-21: 2 mg via INTRAVENOUS
  Filled 2015-01-21: qty 1

## 2015-01-21 MED ORDER — DEXTROSE 5 % IV SOLN
5.0000 mg/h | INTRAVENOUS | Status: DC
  Administered 2015-01-21: 5 mg/h via INTRAVENOUS
  Filled 2015-01-21: qty 100

## 2015-01-21 MED ORDER — PIPERACILLIN-TAZOBACTAM 3.375 G IVPB
3.3750 g | Freq: Three times a day (TID) | INTRAVENOUS | Status: DC
  Administered 2015-01-21 – 2015-01-22 (×4): 3.375 g via INTRAVENOUS
  Filled 2015-01-21 (×6): qty 50

## 2015-01-21 MED ORDER — MIDAZOLAM HCL 2 MG/2ML IJ SOLN
2.0000 mg | Freq: Once | INTRAMUSCULAR | Status: AC
  Administered 2015-01-21: 2 mg via INTRAVENOUS

## 2015-01-21 MED ORDER — SODIUM CHLORIDE 0.9 % IV BOLUS (SEPSIS)
500.0000 mL | Freq: Once | INTRAVENOUS | Status: AC
  Administered 2015-01-21: 500 mL via INTRAVENOUS

## 2015-01-21 MED ORDER — FAMOTIDINE IN NACL 20-0.9 MG/50ML-% IV SOLN
20.0000 mg | INTRAVENOUS | Status: DC
  Administered 2015-01-21 – 2015-02-04 (×15): 20 mg via INTRAVENOUS
  Filled 2015-01-21 (×16): qty 50

## 2015-01-21 MED ORDER — MORPHINE SULFATE 2 MG/ML IJ SOLN
1.0000 mg | INTRAMUSCULAR | Status: DC | PRN
  Administered 2015-01-21: 1 mg via INTRAVENOUS

## 2015-01-21 MED ORDER — MORPHINE SULFATE 2 MG/ML IJ SOLN
2.0000 mg | INTRAMUSCULAR | Status: DC | PRN
  Administered 2015-01-21: 2 mg via INTRAVENOUS
  Filled 2015-01-21: qty 1

## 2015-01-21 MED ORDER — DILTIAZEM LOAD VIA INFUSION
10.0000 mg | Freq: Once | INTRAVENOUS | Status: AC
  Administered 2015-01-21: 10 mg via INTRAVENOUS
  Filled 2015-01-21: qty 10

## 2015-01-21 MED ORDER — HEPARIN BOLUS VIA INFUSION
3500.0000 [IU] | Freq: Once | INTRAVENOUS | Status: AC
  Administered 2015-01-21: 3500 [IU] via INTRAVENOUS
  Filled 2015-01-21: qty 3500

## 2015-01-21 MED ORDER — MIDAZOLAM HCL 2 MG/2ML IJ SOLN
2.0000 mg | INTRAMUSCULAR | Status: DC | PRN
  Administered 2015-01-24 – 2015-01-27 (×14): 2 mg via INTRAVENOUS
  Filled 2015-01-21 (×14): qty 2

## 2015-01-21 MED ORDER — FENTANYL CITRATE 0.05 MG/ML IJ SOLN
100.0000 ug | INTRAMUSCULAR | Status: DC | PRN
  Administered 2015-01-23 (×3): 100 ug via INTRAVENOUS
  Filled 2015-01-21 (×3): qty 2

## 2015-01-21 MED ORDER — MIDAZOLAM HCL 2 MG/2ML IJ SOLN
INTRAMUSCULAR | Status: AC
  Filled 2015-01-21: qty 4

## 2015-01-21 MED ORDER — ACETAMINOPHEN 325 MG PO TABS
650.0000 mg | ORAL_TABLET | Freq: Four times a day (QID) | ORAL | Status: DC | PRN
  Administered 2015-01-21 – 2015-02-13 (×3): 650 mg via ORAL
  Filled 2015-01-21 (×3): qty 2

## 2015-01-21 MED ORDER — PROPOFOL 10 MG/ML IV EMUL
5.0000 ug/kg/min | INTRAVENOUS | Status: DC
  Administered 2015-01-21: 20 ug/kg/min via INTRAVENOUS
  Administered 2015-01-21: 10 ug/kg/min via INTRAVENOUS
  Administered 2015-01-22: 50 ug/kg/min via INTRAVENOUS
  Administered 2015-01-22: 49.834 ug/kg/min via INTRAVENOUS
  Administered 2015-01-22: 20 ug/kg/min via INTRAVENOUS
  Administered 2015-01-22: 65 ug/kg/min via INTRAVENOUS
  Administered 2015-01-23: 40 ug/kg/min via INTRAVENOUS
  Administered 2015-01-23 (×2): 30 ug/kg/min via INTRAVENOUS
  Administered 2015-01-23: 60 ug/kg/min via INTRAVENOUS
  Administered 2015-01-24: 15 ug/kg/min via INTRAVENOUS
  Administered 2015-01-24: 30 ug/kg/min via INTRAVENOUS
  Administered 2015-01-25: 25 ug/kg/min via INTRAVENOUS
  Administered 2015-01-25: 20 ug/kg/min via INTRAVENOUS
  Administered 2015-01-25: 25 ug/kg/min via INTRAVENOUS
  Administered 2015-01-26: 20 ug/kg/min via INTRAVENOUS
  Filled 2015-01-21 (×13): qty 100
  Filled 2015-01-21: qty 200

## 2015-01-21 MED ORDER — FENTANYL CITRATE 0.05 MG/ML IJ SOLN
100.0000 ug | INTRAMUSCULAR | Status: DC | PRN
  Administered 2015-01-22: 100 ug via INTRAVENOUS
  Filled 2015-01-21 (×4): qty 2

## 2015-01-21 MED ORDER — FENTANYL CITRATE 0.05 MG/ML IJ SOLN
100.0000 ug | Freq: Once | INTRAMUSCULAR | Status: AC
  Administered 2015-01-21: 100 ug via INTRAVENOUS

## 2015-01-21 MED ORDER — VANCOMYCIN HCL 500 MG IV SOLR
500.0000 mg | INTRAVENOUS | Status: DC
  Administered 2015-01-21 – 2015-01-23 (×3): 500 mg via INTRAVENOUS
  Filled 2015-01-21 (×4): qty 500

## 2015-01-21 MED ORDER — MIDAZOLAM HCL 2 MG/2ML IJ SOLN
2.0000 mg | INTRAMUSCULAR | Status: AC | PRN
  Administered 2015-01-24 (×3): 2 mg via INTRAVENOUS
  Filled 2015-01-21 (×2): qty 2

## 2015-01-21 MED ORDER — HEPARIN (PORCINE) IN NACL 100-0.45 UNIT/ML-% IJ SOLN
1000.0000 [IU]/h | INTRAMUSCULAR | Status: DC
  Administered 2015-01-21: 1000 [IU]/h via INTRAVENOUS
  Filled 2015-01-21: qty 250

## 2015-01-21 MED ORDER — FENTANYL CITRATE 0.05 MG/ML IJ SOLN
INTRAMUSCULAR | Status: AC
  Filled 2015-01-21: qty 4

## 2015-01-21 MED ORDER — METOPROLOL TARTRATE 1 MG/ML IV SOLN
5.0000 mg | Freq: Once | INTRAVENOUS | Status: AC
  Administered 2015-01-21: 5 mg via INTRAVENOUS

## 2015-01-21 MED ORDER — METOPROLOL TARTRATE 1 MG/ML IV SOLN
INTRAVENOUS | Status: AC
  Filled 2015-01-21: qty 5

## 2015-01-21 MED ORDER — METOPROLOL TARTRATE 1 MG/ML IV SOLN
5.0000 mg | Freq: Once | INTRAVENOUS | Status: AC
  Administered 2015-01-21: 5 mg via INTRAVENOUS
  Filled 2015-01-21: qty 5

## 2015-01-21 MED ORDER — HEPARIN (PORCINE) IN NACL 100-0.45 UNIT/ML-% IJ SOLN
1150.0000 [IU]/h | INTRAMUSCULAR | Status: DC
  Administered 2015-01-21: 850 [IU]/h via INTRAVENOUS
  Administered 2015-01-23: 1300 [IU]/h via INTRAVENOUS
  Administered 2015-01-24: 1150 [IU]/h via INTRAVENOUS
  Filled 2015-01-21 (×7): qty 250

## 2015-01-21 NOTE — Progress Notes (Signed)
ANTIBIOTIC CONSULT NOTE - INITIAL  Pharmacy Consult for Vanc/Zosyn Indication: rule out sepsis  Allergies  Allergen Reactions  . Sulfa Antibiotics Itching    Patient Measurements: Weight: 132 lb 11.2 oz (60.192 kg)  Vital Signs: Temp: 100.7 F (38.2 C) (03/20 0716) Temp Source: Oral (03/20 0716) BP: 160/87 mmHg (03/20 0940) Pulse Rate: 154 (03/20 0945) Intake/Output from previous day: 03/19 0701 - 03/20 0700 In: 880 [P.O.:480; I.V.:400] Out: 850 [Urine:850] Intake/Output from this shift:    Labs:  Recent Labs  01/18/15 1840  01/19/15 0500 01/20/15 0427 01/21/15 0305  WBC 13.3*  --  8.3  --  12.3*  HGB 7.1*  < > 8.6* 7.4* 8.6*  PLT 279  --  215  --  265  CREATININE 2.97*  --  3.27* 3.29* 2.73*  < > = values in this interval not displayed. Estimated Creatinine Clearance: 21.7 mL/min (by C-G formula based on Cr of 2.73). No results for input(s): VANCOTROUGH, VANCOPEAK, VANCORANDOM, GENTTROUGH, GENTPEAK, GENTRANDOM, TOBRATROUGH, TOBRAPEAK, TOBRARND, AMIKACINPEAK, AMIKACINTROU, AMIKACIN in the last 72 hours.   Microbiology: No results found for this or any previous visit (from the past 720 hour(s)).  Medical History: Past Medical History  Diagnosis Date  . Hypertension   . Lupus   . CKD (chronic kidney disease)     due to lupus/Dr. [name]  . Diabetes mellitus   . Stenosis of cervical spine region     with HNP at C5/6, C6/7  . Metatarsal bone fracture right    4th  . 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 Arthritis     RA  . Anemia   . Stroke 11/2014    left sided weakness, dysphagia  . Pain in joints   . Paresthesias   . Hyperlipidemia   . Colitis     ? per hospital notes 11/2014  . Lower GI bleed   . Hyponatremia   . Metabolic acidosis   . Hyperplastic colon polyp   . Hemorrhoids   . Blood transfusion without reported diagnosis     Assessment: 54 yo female immunosuppressed and on Cellcept and  Plaquenil for Lupus. Overnight 3/19 developed intermittent SVT, then persistent tachycardia with HR 150's and worsening SOB and hypoxia requiring NRB. Patient is febrile to 100.7, WBC 12.3, ?c diff (sample sent), pharmacy consulted to dose vanc/Zosyn for sepsis. Patient with CKD 4, SCr improving to 2.73 and CrCl ~21. Recent outpatient charted weights and first weight here>> 56kg (next day was 62kg here). Using more consistent 56kg weight for vanc dosing.  Goal of Therapy:  Vancomycin trough level 15-20 mcg/ml  Plan:  Start Zosyn 3.375g IV q8h (4 hr infusion) Start vancomycin 500mg  IV q24h Measure antibiotic drug levels at steady state Follow up culture results  Tilmon Wisehart E. Rufina Kimery, Pharm.D Clinical Pharmacy Resident Pager: [phone] 01/21/2015 11:20 AM

## 2015-01-21 NOTE — Progress Notes (Signed)
Pt with c/o SOB. HR elevated at 152 and sustaining. Denies chest pain. O2 saturation 93% on RA . BP = 163/89.  Page to [facility]. [place] for notification. Page to [facility] for second assess. Dorthey Sawyer, RN

## 2015-01-21 NOTE — Progress Notes (Signed)
Return call from M. Brozowski. New order for lopressor. Dorthey Sawyer, RN

## 2015-01-21 NOTE — Progress Notes (Signed)
Subjective/Objective Patient experiencing abdominal pain and tightness and having difficulty breathing.   Physical exam: General: alert, oriented, appears in pain Heart: tachycardic rate, regular rhythm- EKG w/sinus tach Lungs : clear to auscultation bilaterally Abd: soft, tender to palpation w/guarding.  Scheduled Meds: . atorvastatin  20 mg Oral q1800  . azithromycin  250 mg Oral Daily  . ferrous sulfate  325 mg Oral BID WC  . folic acid  1 mg Oral Daily  . gabapentin  100 mg Oral QHS  . hydrocortisone-pramoxine  1 applicator Rectal BID  . hydroxychloroquine  400 mg Oral Daily  . multivitamin  1 tablet Oral QHS  . mycophenolate  1,000 mg Oral BID  . nicotine  14 mg Transdermal Q24H  . pantoprazole  40 mg Oral Daily  . sodium bicarbonate  1,300 mg Oral BID  . sodium chloride  3 mL Intravenous Q12H  . sodium chloride  3 mL Intravenous Q12H  . thiamine  100 mg Oral Daily  . [START ON 01/24/2015] Vitamin D (Ergocalciferol)  50,000 Units Oral Q Wed   Continuous Infusions: . sodium chloride 50 mL/hr at 01/20/15 2145   PRN Meds:sodium chloride, ALPRAZolam, menthol-cetylpyridinium, morphine injection, ondansetron **OR** ondansetron (ZOFRAN) IV, oxyCODONE, sodium chloride  Vital signs in last 24 hours: Temp:  [98.4 F (36.9 C)-99.8 F (37.7 C)] 99.1 F (37.3 C) (03/20 0148) Pulse Rate:  [114-152] 152 (03/20 0148) Resp:  [18-22] 22 (03/20 0020) BP: (145-180)/(77-89) 163/89 mmHg (03/20 0148) SpO2:  [93 %-100 %] 93 % (03/20 0148)  Intake/Output last 3 shifts: I/O last 3 completed shifts: In: 1715.8 [P.O.:1560; I.V.:155.8] Out: -  Intake/Output this shift: Total I/O In: 400 [I.V.:400] Out: -   Problem Assessment/Plan  Dyspnea with acute abominal pain Abd xray with normal gas pattern, repeating CBC to evaluate hgb. GI pathogen panel pending.

## 2015-01-21 NOTE — Progress Notes (Signed)
*  PRELIMINARY RESULTS* Echocardiogram 2D Echocardiogram has been performed.  [name] [name] 01/21/2015, 5:01 PM

## 2015-01-21 NOTE — Progress Notes (Addendum)
Pt chart shadowed for tx to sepdown, pt on enteric precautions for loose stools, GI Pathogen test ordered, no C-Diff by PCR ordered per protocol, lab called to see if able to add-on lab; unable to add-on, new stool sample to be collected for C-Diff, txing RN updated

## 2015-01-21 NOTE — Consult Note (Signed)
PULMONARY / CRITICAL CARE MEDICINE   Name: [name] [name] MRN: [id number] DOB: [birth year]    ADMISSION DATE:  01/18/2015 CONSULTATION DATE:  3/20  REFERRING MD :  Eliseo Squires   CHIEF COMPLAINT:  Tachycardia, resp failure   INITIAL PRESENTATION:  54 yo female with hx lupus (on cellcept, plaquenil), CKD, DM, HTN initially presented 3/17 with vague c/o SOB, diarrhea, malaise, weakness, cough.  She was admitted by [facility] and r/o for flu.  Was undergoing further w/u for diarrhea, mildly elevated troponin and anemia.  Overnight 3/19 developed intermittent SVT, then persistent tachycardia with HR 150's and worsening SOB and hypoxia requiring NRB.  PCCM consulted 3/20.   STUDIES:  CXR 3/20>>>neg  2D echo 3/19>>> DG64-40%, grade 1 diastolic dysfunction, mod AR, mod MR, mod TR. No pericardial effusion.   SIGNIFICANT EVENTS:   HISTORY OF PRESENT ILLNESS: 54 yo female with hx lupus, CKD, DM, HTN initially presented 3/17 with vague c/o SOB, diarrhea, malaise, weakness, cough.  She was admitted by [facility] and r/o for flu.  Was undergoing further w/u for diarrhea, mildly elevated troponin and anemia.  Overnight 3/19 developed intermittent SVT, then persistent tachycardia with HR 150's and worsening SOB and hypoxia requiring NRB.  PCCM consulted 3/20.   Per pt, SOB is acutely worse since last night.  Does c/o cough, fever, chills, malaise, diarrhea.  Denies chest pain, lightheadedness, purulent sputum, hemoptysis, abd pain, vomiting.     PAST MEDICAL HISTORY :   has a past medical history of Hypertension; Lupus; CKD (chronic kidney disease); Diabetes mellitus; Stenosis of cervical spine region; Metatarsal bone fracture (right); Chronic ankle pain; Membranous glomerulonephritis; Arthritis; Anemia; Stroke (11/2014); Pain in joints; Paresthesias; Hyperlipidemia; Colitis; Lower GI bleed; Hyponatremia; Metabolic acidosis; Hyperplastic colon polyp; Hemorrhoids; and Blood transfusion without reported diagnosis.  has past  surgical history that includes Breast biopsy and Tubal ligation. Prior to Admission medications   Medication Sig Start Date End Date Taking? Authorizing Provider  acetaminophen (TYLENOL) 325 MG tablet Take 2 tablets (650 mg total) by mouth every 4 (four) hours as needed for mild pain. 10/25/14  Yes Ivan Anchors Love, PA-C  ALPRAZolam (XANAX) 0.5 MG tablet Take 1 tablet (0.5 mg total) by mouth 2 (two) times daily as needed for anxiety. 10/25/14  Yes Ivan Anchors Love, PA-C  gabapentin (NEURONTIN) 100 MG capsule Take 1 capsule (100 mg total) by mouth at bedtime. Patient taking differently: Take 100 mg by mouth 3 (three) times daily.  11/25/14  Yes Orson Eva, MD  labetalol (NORMODYNE) 300 MG tablet Take 2 tablets (600 mg total) by mouth 2 (two) times daily. 11/25/14  Yes Orson Eva, MD  mycophenolate (CELLCEPT) 250 MG capsule Take 4 capsules (1,000 mg total) by mouth 2 (two) times daily. 10/25/14  Yes Ivan Anchors Love, PA-C  senna-docusate (SENOKOT-S) 8.6-50 MG per tablet Take 1 tablet by mouth 2 (two) times daily. Patient taking differently: Take 1 tablet by mouth 2 (two) times daily as needed.  10/25/14  Yes Ivan Anchors Love, PA-C  sodium bicarbonate 650 MG tablet Take 2 tablets (1,300 mg total) by mouth 2 (two) times daily. 10/25/14  Yes Ivan Anchors Love, PA-C  Vitamin D, Ergocalciferol, (DRISDOL) 50000 UNITS CAPS capsule Take 1 capsule (50,000 Units total) by mouth every Wednesday. 10/25/14  Yes Ivan Anchors Love, PA-C  atorvastatin (LIPITOR) 20 MG tablet Take 1 tablet (20 mg total) by mouth daily at 6 PM. 10/25/14   Ivan Anchors Love, PA-C  cloNIDine (CATAPRES) 0.3 MG tablet Take 1 tablet (  0.3 mg total) by mouth 3 (three) times daily. 10/25/14   Bary Leriche, PA-C  clotrimazole (MYCELEX) 10 MG troche Take 1 tablet (10 mg total) by mouth 5 (five) times daily. 11/25/14   Orson Eva, MD  cyclobenzaprine (FLEXERIL) 5 MG tablet Take 5 mg by mouth 3 (three) times daily as needed for muscle spasms.  10/10/14   Historical Provider, MD   diclofenac sodium (VOLTAREN) 1 % GEL Apply 2 g topically 4 (four) times daily. To forefoot 10/25/14   Ivan Anchors Love, PA-C  ferrous sulfate 325 (65 FE) MG tablet Take 1 tablet (325 mg total) by mouth daily with breakfast. 10/25/14   Ivan Anchors Love, PA-C  furosemide (LASIX) 80 MG tablet Take 80 mg by mouth 2 (two) times daily. 10/25/14   Historical Provider, MD  hydrALAZINE (APRESOLINE) 25 MG tablet Take 25 mg by mouth every 8 (eight) hours. 10/25/14   Historical Provider, MD  hydroxychloroquine (PLAQUENIL) 200 MG tablet Take 2 tablets (400 mg total) by mouth daily. 10/25/14   Bary Leriche, PA-C  lidocaine (XYLOCAINE) 2 % solution  11/06/14   Historical Provider, MD  Menthol-Methyl Salicylate (MUSCLE RUB) 10-15 % CREA Apply 1 application topically 3 (three) times daily before meals. 10/25/14   Bary Leriche, PA-C  multivitamin (RENA-VIT) TABS tablet Take 1 tablet by mouth daily.    Historical Provider, MD  nystatin-triamcinolone (MYCOLOG II) cream Apply topically 2 (two) times daily. 11/06/14   Historical Provider, MD  nystatin-triamcinolone ointment (MYCOLOG) Apply 1 application topically 2 (two) times daily. 11/06/14   Mercedes Camprubi-Soms, PA-C  pantoprazole (PROTONIX) 40 MG tablet Take 1 tablet (40 mg total) by mouth daily. 12/07/14   Laban Emperor Zehr, PA-C  pramoxine-hydrocortisone (PROCTOCREAM-HC) 1-1 % rectal cream Place 1 application rectally 2 (two) times daily. Patient taking differently: Place 1 application rectally 2 (two) times daily as needed for hemorrhoids or itching.  12/07/14   Laban Emperor Zehr, PA-C   Allergies  Allergen Reactions  . Sulfa Antibiotics Itching    FAMILY HISTORY:  indicated that her mother is alive. She indicated that her father is deceased. She indicated that her sister is alive. She indicated that her brother is alive. She indicated that her maternal grandmother is deceased. She indicated that her maternal grandfather is deceased. She indicated that her paternal  grandmother is deceased. She indicated that her paternal grandfather is deceased. She indicated that her daughter is alive. She indicated that both of her sons are alive.  SOCIAL HISTORY:  reports that she has quit smoking. Her smoking use included Cigarettes. She has a 15 pack-year smoking history. She has never used smokeless tobacco. She reports that she does not drink alcohol or use illicit drugs.  REVIEW OF SYSTEMS:  As per HPI - All other systems reviewed and were neg.   SUBJECTIVE:  Complains of dyspnea  VITAL SIGNS: Temp:  [98.4 F (36.9 C)-100.7 F (38.2 C)] 100.7 F (38.2 C) (03/20 0716) Pulse Rate:  [116-157] 154 (03/20 0945) Resp:  [18-44] 40 (03/20 0945) BP: (149-180)/(81-95) 160/87 mmHg (03/20 0940) SpO2:  [90 %-99 %] 92 % (03/20 0945) FiO2 (%):  [40 %] 40 % (03/20 0945) Weight:  [132 lb 11.2 oz (60.192 kg)] 132 lb 11.2 oz (60.192 kg) (03/19 2100) HEMODYNAMICS:   VENTILATOR SETTINGS: Vent Mode:  [-]  FiO2 (%):  [40 %] 40 % INTAKE / OUTPUT:  Intake/Output Summary (Last 24 hours) at 01/21/15 1024 Last data filed at 01/21/15 0500  Gross per 24  hour  Intake    640 ml  Output    850 ml  Net   -210 ml    PHYSICAL EXAMINATION: General:  Chronically ill appearing female, acute resp distress  Neuro:  Somnolent but arousable, answers questions, moans at times, MAE  HEENT:  Mm dry, NRB, no LA Cardiovascular:  s1s2 tachy 150's  Lungs:  resps shallow, tachypneic RR 40-50.  Few scattered crackles but ess clear Abdomen:  *round, mildly distended, soft, mildly tender diffusely  Musculoskeletal:  Warm and dry, gray appearing, no edema  LABS:  CBC  Recent Labs Lab 01/18/15 1840  01/19/15 0500 01/20/15 0427 01/21/15 0305  WBC 13.3*  --  8.3  --  12.3*  HGB 7.1*  < > 8.6* 7.4* 8.6*  HCT 22.4*  < > 26.6* 23.4* 26.1*  PLT 279  --  215  --  265  < > = values in this interval not displayed. Coag's No results for input(s): APTT, INR in the last 168  hours. BMET  Recent Labs Lab 01/19/15 0500 01/20/15 0427 01/21/15 0305  NA 137 135 134*  K 3.9 3.9 3.9  CL 107 105 104  CO2 18* 21 14*  BUN 38* 39* 32*  CREATININE 3.27* 3.29* 2.73*  GLUCOSE 82 90 86   Electrolytes  Recent Labs Lab 01/19/15 0500 01/20/15 0427 01/21/15 0305  CALCIUM 6.4* 6.2* 6.8*  PHOS  --  4.2  --    Sepsis Markers  Recent Labs Lab 01/18/15 1858  LATICACIDVEN 1.48   ABG  Recent Labs Lab 01/21/15 0955  PHART 7.329*  PCO2ART 27.8*  PO2ART 77.0*   Liver Enzymes  Recent Labs Lab 01/19/15 0500 01/20/15 0427  AST 14  --   ALT 8  --   ALKPHOS 58  --   BILITOT 0.7  --   ALBUMIN 2.2* 2.3*   Cardiac Enzymes  Recent Labs Lab 01/18/15 2255 01/19/15 0500 01/19/15 0956  TROPONINI 0.57* 0.44* 0.38*   Glucose  Recent Labs Lab 01/18/15 2221 01/19/15 0757 01/20/15 0808 01/21/15 0214 01/21/15 0746  GLUCAP 96 88 79 78 91    Imaging No results found.   ASSESSMENT / PLAN:  PULMONARY Acute hypoxic respiratory failure - unknown etiology. ?viral illness v PE v decompensated resp status in setting extended SVT/sinus tach v underlying infection in immunocompromised pt.  P:   Tx ICU  Intubation  Vent support  Hold VQ scan for now. Cannot CTA chest to r/o PE r/t CKD > will plan to anticoagulate since PE high in the DDx Supplemental O2  F/u ABG  F/u CXR  HR control   CARDIOVASCULAR SVT/sinus tach - HR 150's despite cardizem, B blockade  Mildly elevated troponin  dCHF - new.  EF 40-45% with MOD aortic, tricuspid and mitral regrurg.  ??PE  P:  Cont cardizem gtt for now, likely d/c once sedated Consider adenosine once in ICU to insure not a flutter Stat 12 lead  Repeat lopressor 5mg  IV x 1  Cycle troponin  BLE venous dopplers  Empiric heparin gtt for ??PE   RENAL CKD - baseline Scr 2.6-2.9 Hyponatremia  P:   F/u chem   GASTROINTESTINAL Diarrhea  abd pain - neg abd xray  P:   CDiff, stool cultures pending  NPO   pepcid    HEMATOLOGIC Leukocytosis  Anemia  P:  Heparin gtt as above  Cont iron  F/u CBC  INFECTIOUS Fever  Unclear etiology of infection.  ??GI given diarrhea v viral v other  source.  Immunocompromised on rx for lupus.  P:   BCx2 3/20>>> UC 3/20>>>  Vanc 3/20 (empiric) >>> Zosyn 3/20 (empiric) >>>  Pan culture  Check lactate, pct  Empiric broad spectrum abx  Hold cellcept, plaquenil temporarily   ENDOCRINE No active issue    P:   Monitor glucose on chem   NEUROLOGIC Sedation needs  P:   RASS goal: -1 PRN fent/ versed while on vent    FAMILY  - Updates:  Mother updated at beside 3/20  - Inter-disciplinary family meet or Palliative Care meeting due by: 3/26   Nickolas Madrid, NP 01/21/2015  10:24 AM Pager: [phone] or [phone]  Attending Note:  I have examined patient, reviewed labs, studies and notes. I have discussed the case with Shon Millet, and I agree with the data and plans as amended above. Ms Jeancharles has SLE, has been dealing with diarrhea and GI sx for months. She was admitted with viral sx and similar GI sx. She became acutely dyspneic, tachycardic 3/19 pm, etiology unclear. On my evaluation she has some B insp crackles, is uncomfortable, tachypneic. Her o2 needs have increased through the morning. She has impending respiratory failure. We will plan to move her to the the ICU for vent support, start empiric abx, empiric heparin in case this is PE. Suspect that her tachycardia is reactive. If possible we will check CT scan abd. She is not in position to get CT-PA due to renal fxn. Will check LE dopplers. Independent critical care time is 60 minutes.   Baltazar Apo, MD, PhD 01/21/2015, 4:15 PM [facility] [phone] or if no answer [phone]

## 2015-01-21 NOTE — Progress Notes (Addendum)
[facility] PROGRESS NOTE  [name] [name]:[id number] DOB: 10/08/1961 DOA: 01/18/2015 PCP: Abbe Amsterdam, MD  Assessment/Plan:    Fever- ? Etiology- blood cultures pending -flu negative -procalcitonin -lactic acid  Tachycardia- will try to lower the fever with tylenol, cardizem gtt R/o PE- heparin gtt    Anemia of chronic disease: stool heme negative. Acute on chronic. Weakness improved after transfusion. CKD, chronic disease, lupus. Recently worked up    Hypotension: SBP 89. Hold antihypertensives. No evidence of sepsis. May be volume depletion. Has had diarrhea after food for 3 weeks, lost 20 lbs. Last prednisone only 7 days, doubt adrenal crisis.     Diarrhea:  Report multiple loose stools particularly after eating for 3 weeks. Check GI pathogen panel. On immunosuppresants, so at risk for infection. Could also be cellcept. If infection ruled out    Systemic lupus erythematosus: on cellcept and plaquenil.    Essential hypertension    CKD (chronic kidney disease) stage 4, GFR 15-29 ml/min 2.8-3.2 baseline    Shortness of breath -chest x ray 3/20 AM showed- tiny pleural effusions -echo shows- EF 40-45% -cardiology consult when patient more stable    Elevated troponin level: see above echo. Last echo with normal EF  URI: flu neg.   Hypocalcemia -repleted  With fever, tachycardia and c/o SOB, will move to SDU to r/o PE and start cardizem gtt/heparin gtt -will ask PCCM to see patient   HPI/Subjective: C/o SOB   Objective: Filed Vitals:   01/21/15 0716  BP: 165/94  Pulse: 148  Temp: 100.7 F (38.2 C)  Resp: 20    Intake/Output Summary (Last 24 hours) at 01/21/15 8119 Last data filed at 01/21/15 0500  Gross per 24 hour  Intake    640 ml  Output    850 ml  Net   -210 ml   Filed Weights   01/19/15 2127 01/20/15 2100  Weight: 56.564 kg (124 lb 11.2 oz) 60.192 kg (132 lb 11.2 oz)    Exam:   General:  Uncomfortable. Ill  appearing  Cardiovascular: tachy  Respiratory: no wheezing  Abdomen: S, NT, ND  Ext: no CCE  Basic Metabolic Panel:  Recent Labs Lab 01/18/15 1840 01/19/15 0500 01/20/15 0427 01/21/15 0305  NA 135 137 135 134*  K 4.3 3.9 3.9 3.9  CL 105 107 105 104  CO2 20 18* 21 14*  GLUCOSE 144* 82 90 86  BUN 38* 38* 39* 32*  CREATININE 2.97* 3.27* 3.29* 2.73*  CALCIUM 6.6* 6.4* 6.2* 6.8*  PHOS  --   --  4.2  --    Liver Function Tests:  Recent Labs Lab 01/19/15 0500 01/20/15 0427  AST 14  --   ALT 8  --   ALKPHOS 58  --   BILITOT 0.7  --   PROT 6.1  --   ALBUMIN 2.2* 2.3*   No results for input(s): LIPASE, AMYLASE in the last 168 hours. No results for input(s): AMMONIA in the last 168 hours. CBC:  Recent Labs Lab 01/18/15 1840 01/19/15 0153 01/19/15 0500 01/20/15 0427 01/21/15 0305  WBC 13.3*  --  8.3  --  12.3*  HGB 7.1* 9.4* 8.6* 7.4* 8.6*  HCT 22.4* 29.0* 26.6* 23.4* 26.1*  MCV 83.9  --  82.6  --  82.1  PLT 279  --  215  --  265   Cardiac Enzymes:  Recent Labs Lab 01/18/15 2255 01/19/15 0500 01/19/15 0956  TROPONINI 0.57* 0.44* 0.38*   BNP (last 3 results) No  results for input(s): BNP in the last 8760 hours.  ProBNP (last 3 results)  Recent Labs  10/05/14 0521  PROBNP 20673.0*    CBG:  Recent Labs Lab 01/18/15 2221 01/19/15 0757 01/20/15 0808 01/21/15 0214 01/21/15 0746  GLUCAP 96 88 79 78 91    No results found for this or any previous visit (from the past 240 hour(s)).   Studies: Dg Chest Port 1 View  01/21/2015   CLINICAL DATA:  Dyspnea, postprandial  EXAM: PORTABLE CHEST - 1 VIEW  COMPARISON:  01/18/2015  FINDINGS: There are mildly prominent left ventricular contours, unchanged. The lungs are clear. Minimal blunting of the lateral costophrenic angles may represent tiny effusions. Pulmonary vasculature is normal.  IMPRESSION: Possible tiny pleural effusions.   Electronically Signed   By: Ellery Plunk M.D.   On: 01/21/2015  03:32   Dg Abd Portable 1v  01/21/2015   CLINICAL DATA:  Sudden onset abdominal pain with dyspnea, postprandial  EXAM: PORTABLE ABDOMEN - 1 VIEW  COMPARISON:  CT 11/19/2014  FINDINGS: The bowel gas pattern is normal. No radio-opaque calculi or other significant radiographic abnormality are seen.  IMPRESSION: Negative.   Electronically Signed   By: Ellery Plunk M.D.   On: 01/21/2015 03:26    Scheduled Meds: . atorvastatin  20 mg Oral q1800  . azithromycin  250 mg Oral Daily  . diltiazem  10 mg Intravenous Once  . ferrous sulfate  325 mg Oral BID WC  . folic acid  1 mg Oral Daily  . gabapentin  100 mg Oral QHS  . hydrocortisone-pramoxine  1 applicator Rectal BID  . hydroxychloroquine  400 mg Oral Daily  . multivitamin  1 tablet Oral QHS  . mycophenolate  1,000 mg Oral BID  . nicotine  14 mg Transdermal Q24H  . pantoprazole  40 mg Oral Daily  . sodium bicarbonate  1,300 mg Oral BID  . sodium chloride  3 mL Intravenous Q12H  . sodium chloride  3 mL Intravenous Q12H  . thiamine  100 mg Oral Daily  . [START ON 01/24/2015] Vitamin D (Ergocalciferol)  50,000 Units Oral Q Wed   Continuous Infusions: . sodium chloride 50 mL/hr at 01/20/15 2145  . diltiazem (CARDIZEM) infusion      Time spent: 35 minutes  Damara Klunder  [facility]  [email or web address], password [facility] 01/21/2015, 9:22 AM  LOS: 2 days

## 2015-01-21 NOTE — Significant Event (Signed)
Rapid Response Event Note  Overview: Assisted with transport from 6E to 3S. Patient transported in bed, on monitoring and O2. HRs 150s, O2 Sats 93% on 2 LPM Nasal Cannula. Initial Focused Assessment:   Interventions:   Event Summary:    at    Outcome: Stayed in room and stabalized  Event End Time: 0330  Baron Hamper

## 2015-01-21 NOTE — Progress Notes (Signed)
Report called to 3S RN, "Di Kindle." Transferred to [phone] by Rapid Response RN  And NT. All belongings sent with patient. "Mother" called prior to leaving room and informed of transfer and room number. Patient requested no other information to be given.

## 2015-01-21 NOTE — Progress Notes (Signed)
Pt sleeping comfortably. HR remains in the 150's. Will continue to monitor. Dorthey Sawyer, RN

## 2015-01-21 NOTE — Procedures (Signed)
Intubation Procedure Note DACY ENRICO [id number] 1961-03-23  Procedure: Intubation Indications: Respiratory insufficiency  Procedure Details Consent: Risks of procedure as well as the alternatives and risks of each were explained to the (patient/caregiver).  Consent for procedure obtained. Time Out: Verified patient identification, verified procedure, site/side was marked, verified correct patient position, special equipment/implants available, medications/allergies/relevent history reviewed, required imaging and test results available.  Performed  Maximum sterile technique was used including antiseptics, cap, gloves, gown, hand hygiene, mask and sheet.  4 glidescope     Evaluation Hemodynamic Status: BP stable throughout; O2 sats: transiently fell during during procedure Patient's Current Condition: stable Complications: No apparent complications Patient did tolerate procedure well. Chest X-ray ordered to verify placement.  CXR: pending.   Nickolas Madrid, NP 01/21/2015  2:19 PM Pager: [phone] or [phone]   Baltazar Apo, MD, PhD 01/21/2015, 4:22 PM [facility] [phone] or if no answer [phone]

## 2015-01-21 NOTE — Significant Event (Signed)
Rapid Response Event Note Called per floor RN for pt with mild SOB and increased HR 150s  Overview: Time Called: 0150 Arrival Time: 0200 Event Type: Cardiac  Initial Focused Assessment: Pt found resting in bed with eyes closed, alert oriented x 4. Denies Chest pain but confirms "tightness in my stomach" and nausea. Abdomen soft with guarding present to touch, BS x 4. Lungs clear, diminished at bases bilateral. Pt warm to touch, oral temp 99.1, HR 152 ST on tele monitor, pulse ox 97% on RA, BP 135/85.   Interventions: EKG done revealing ST with no other change from pt prior EKG. [facility] NP M.Seigler at bedside to review EKG. Labs and ABD film ordered Stat. Morphine 2mg  and Zofran IV given.  Update --0330-Pt asleep but HR remains 150s. M.Choquette at bedside to check on pt. ABD film negative, small pleural effusions seen on CXR, no orders at this time. RN to continue monitor closely, advised to notify provider if HR remains elevated in 1 hr, may need lopressor.  Event Summary: Name of Physician Notified: M. Donnal Debar [facility] NP at 0150    at    Outcome: Stayed in room and stabalized  Event End Time: [place], [place]

## 2015-01-21 NOTE — Progress Notes (Signed)
Jonette Eva to bedside for assessment along with RRT. Pt complains of abdominal tightness. New orders for abdominal x-ray, CBC, BMET, zofran and Morphine IV. EKG completed and uploaded to chart. Awaiting lab and x-ray. Dorthey Sawyer, RN

## 2015-01-21 NOTE — Progress Notes (Signed)
Pt tx from 6 East, HR 150s, RR 50s, BP 160/70, pt started on cardizem per MD order, pt with not change, MD/N, MD Eliseo Squires arrived at [facility], CCM consulted, mg Loppresor given HR 130s, RR 40s, pt transitioned to Non Rebx during this  time, MD order to tx ICU

## 2015-01-21 NOTE — Progress Notes (Signed)
Pt HR remains elevated in the 150's, B/P=173/95, temp 100.1. Reports that she remains comfortable, O2 sats remain stable at 96 % on 2 l/m via n/c. Denies any further complaints of pain. Page to [facility]. [place] with update. Dorthey Sawyer, RN

## 2015-01-21 NOTE — Procedures (Signed)
Central Venous Catheter Insertion Procedure Note JOHNETTA SLONIKER [id number] 1961-04-09  Procedure: Insertion of Central Venous Catheter Indications: Assessment of intravascular volume and Drug and/or fluid administration  Procedure Details Consent: Risks of procedure as well as the alternatives and risks of each were explained to the (patient/caregiver).  Consent for procedure obtained. Time Out: Verified patient identification, verified procedure, site/side was marked, verified correct patient position, special equipment/implants available, medications/allergies/relevent history reviewed, required imaging and test results available.  Performed  Maximum sterile technique was used including antiseptics, cap, gloves, gown, hand hygiene, mask and sheet. Skin prep: Chlorhexidine; local anesthetic administered A antimicrobial bonded/coated triple lumen catheter was placed in the left internal jugular vein using the Seldinger technique.  Evaluation Blood flow good Complications: No apparent complications Patient did tolerate procedure well. Chest X-ray ordered to verify placement.  CXR: pending.  Performed under direct MD supervision.  Performed using ultrasound guidance.  Wire visualized in vessel under ultrasound.     Nickolas Madrid, NP 01/21/2015  2:18 PM Pager: [phone] or [phone]   Baltazar Apo, MD, PhD 01/21/2015, 4:22 PM [place] [facility] [phone] or if no answer [phone]

## 2015-01-21 NOTE — Progress Notes (Addendum)
ANTICOAGULATION CONSULT NOTE - Initial Consult  Pharmacy Consult for heparin Indication: pulmonary embolus  Allergies  Allergen Reactions  . Sulfa Antibiotics Itching    Patient Measurements: Weight: 132 lb 11.2 oz (60.192 kg)  Vital Signs: Temp: 100.7 F (38.2 C) (03/20 0716) Temp Source: Oral (03/20 0716) BP: 160/87 mmHg (03/20 0940) Pulse Rate: 154 (03/20 0945)  Labs:  Recent Labs  01/18/15 1840 01/18/15 2255  01/19/15 0500 01/19/15 0956 01/20/15 0427 01/21/15 0305  HGB 7.1*  --   < > 8.6*  --  7.4* 8.6*  HCT 22.4*  --   < > 26.6*  --  23.4* 26.1*  PLT 279  --   --  215  --   --  265  CREATININE 2.97*  --   --  3.27*  --  3.29* 2.73*  TROPONINI  --  0.57*  --  0.44* 0.38*  --   --   < > = values in this interval not displayed.  Estimated Creatinine Clearance: 21.7 mL/min (by C-G formula based on Cr of 2.73).   Medical History: Past Medical History  Diagnosis Date  . Hypertension   . Lupus   . CKD (chronic kidney disease)     due to lupus/Dr. [name]  . Diabetes mellitus   . Stenosis of cervical spine region     with HNP at C5/6, C6/7  . Metatarsal bone fracture right    4th  . 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 Arthritis     RA  . Anemia   . Stroke 11/2014    left sided weakness, dysphagia  . Pain in joints   . Paresthesias   . Hyperlipidemia   . Colitis     ? per hospital notes 11/2014  . Lower GI bleed   . Hyponatremia   . Metabolic acidosis   . Hyperplastic colon polyp   . Hemorrhoids   . Blood transfusion without reported diagnosis    Assessment: 54 yo female with SOB and CXR this AM showing PE. Pharmacy consulted to dose heparin. Patient with anemia of chronic disease and Hgb 7.4>>8.2 after transfusion, plt stable at 265.   Goal of Therapy:  Heparin level 0.3-0.7 units/ml Monitor platelets by anticoagulation protocol: Yes   Plan:  Give 3500 units bolus x 1 Start heparin infusion at  1000 units/hr Check anti-Xa level in 8 hours and daily while on heparin Continue to monitor H&H and platelets  Megan E. Supple, Pharm.D Clinical Pharmacy Resident Pager: [phone] 01/21/2015 11:02 AM   Addendum: Initial heparin level is above goal at 0.88 - could be reflective of bolus. Will decrease rate to 850 units/hr and follow up AM labs.  Nena Jordan, PharmD, BCPS 01/21/2015, 9:16PM

## 2015-01-22 ENCOUNTER — Inpatient Hospital Stay (HOSPITAL_COMMUNITY): Payer: Managed Care, Other (non HMO)

## 2015-01-22 DIAGNOSIS — R7989 Other specified abnormal findings of blood chemistry: Secondary | ICD-10-CM

## 2015-01-22 DIAGNOSIS — J9601 Acute respiratory failure with hypoxia: Secondary | ICD-10-CM | POA: Diagnosis present

## 2015-01-22 DIAGNOSIS — R0602 Shortness of breath: Secondary | ICD-10-CM

## 2015-01-22 DIAGNOSIS — J80 Acute respiratory distress syndrome: Principal | ICD-10-CM

## 2015-01-22 LAB — GLUCOSE, CAPILLARY
GLUCOSE-CAPILLARY: 104 mg/dL — AB (ref 70–99)
GLUCOSE-CAPILLARY: 67 mg/dL — AB (ref 70–99)
Glucose-Capillary: 135 mg/dL — ABNORMAL HIGH (ref 70–99)
Glucose-Capillary: 66 mg/dL — ABNORMAL LOW (ref 70–99)
Glucose-Capillary: 79 mg/dL (ref 70–99)
Glucose-Capillary: 59 mg/dL — ABNORMAL LOW (ref 70–99)
Glucose-Capillary: 111 mg/dL — ABNORMAL HIGH (ref 70–99)

## 2015-01-22 LAB — CBC
HEMATOCRIT: 27.9 % — AB (ref 36.0–46.0)
Hemoglobin: 8.8 g/dL — ABNORMAL LOW (ref 12.0–15.0)
MCH: 26.9 pg (ref 26.0–34.0)
MCHC: 31.5 g/dL (ref 30.0–36.0)
MCV: 85.3 fL (ref 78.0–100.0)
Platelets: 251 10*3/uL (ref 150–400)
RBC: 3.27 MIL/uL — AB (ref 3.87–5.11)
RDW: 15.8 % — ABNORMAL HIGH (ref 11.5–15.5)
WBC: 26.3 10*3/uL — ABNORMAL HIGH (ref 4.0–10.5)

## 2015-01-22 LAB — BASIC METABOLIC PANEL
ANION GAP: 13 (ref 5–15)
BUN: 41 mg/dL — ABNORMAL HIGH (ref 6–23)
CO2: 18 mmol/L — ABNORMAL LOW (ref 19–32)
Calcium: 6.4 mg/dL — CL (ref 8.4–10.5)
Chloride: 108 mmol/L (ref 96–112)
Creatinine, Ser: 4.5 mg/dL — ABNORMAL HIGH (ref 0.50–1.10)
GFR calc Af Amer: 12 mL/min — ABNORMAL LOW (ref >90)
GFR, EST NON AFRICAN AMERICAN: 10 mL/min — AB (ref >90)
GLUCOSE: 64 mg/dL — AB (ref 70–99)
POTASSIUM: 4.3 mmol/L (ref 3.5–5.1)
SODIUM: 139 mmol/L (ref 135–145)

## 2015-01-22 LAB — URINE CULTURE
Colony Count: NO GROWTH
Culture: NO GROWTH

## 2015-01-22 LAB — TYPE AND SCREEN
ABO/RH(D): O POS
Antibody Screen: NEGATIVE
UNIT DIVISION: 0
UNIT DIVISION: 0

## 2015-01-22 LAB — PROCALCITONIN: PROCALCITONIN: 3.21 ng/mL

## 2015-01-22 LAB — HEMOGLOBIN A1C
HEMOGLOBIN A1C: 6.2 % — AB (ref 4.8–5.6)
MEAN PLASMA GLUCOSE: 131 mg/dL

## 2015-01-22 LAB — HEPARIN LEVEL (UNFRACTIONATED)
HEPARIN UNFRACTIONATED: 0.16 [IU]/mL — AB (ref 0.30–0.70)
Heparin Unfractionated: 0.15 IU/mL — ABNORMAL LOW (ref 0.30–0.70)

## 2015-01-22 LAB — TROPONIN I: TROPONIN I: 0.74 ng/mL — AB (ref <0.031)

## 2015-01-22 MED ORDER — CETYLPYRIDINIUM CHLORIDE 0.05 % MT LIQD
7.0000 mL | Freq: Four times a day (QID) | OROMUCOSAL | Status: DC
Start: 2015-01-23 — End: 2015-01-30
  Administered 2015-01-23 – 2015-01-30 (×31): 7 mL via OROMUCOSAL

## 2015-01-22 MED ORDER — PRISMASOL BGK 4/2.5 32-4-2.5 MEQ/L IV SOLN
INTRAVENOUS | Status: DC
  Administered 2015-01-22 – 2015-01-28 (×7): via INTRAVENOUS_CENTRAL
  Filled 2015-01-22 (×12): qty 5000

## 2015-01-22 MED ORDER — PRISMASOL BGK 4/2.5 32-4-2.5 MEQ/L IV SOLN
INTRAVENOUS | Status: DC
  Administered 2015-01-22 – 2015-01-29 (×6): via INTRAVENOUS_CENTRAL
  Filled 2015-01-22 (×8): qty 5000

## 2015-01-22 MED ORDER — DEXTROSE 50 % IV SOLN
25.0000 mL | Freq: Once | INTRAVENOUS | Status: AC
  Administered 2015-01-22: 25 mL via INTRAVENOUS

## 2015-01-22 MED ORDER — FENTANYL CITRATE 0.05 MG/ML IJ SOLN
INTRAMUSCULAR | Status: AC
  Filled 2015-01-22: qty 4

## 2015-01-22 MED ORDER — SODIUM BICARBONATE 8.4 % IV SOLN
INTRAVENOUS | Status: DC
  Administered 2015-01-22: 23:00:00 via INTRAVENOUS
  Filled 2015-01-22 (×2): qty 1000

## 2015-01-22 MED ORDER — HEPARIN (PORCINE) 2000 UNITS/L FOR CRRT
INTRAVENOUS_CENTRAL | Status: DC | PRN
  Administered 2015-01-24 – 2015-01-28 (×3): via INTRAVENOUS_CENTRAL
  Filled 2015-01-22 (×4): qty 1000

## 2015-01-22 MED ORDER — DEXTROSE 50 % IV SOLN
INTRAVENOUS | Status: AC
  Filled 2015-01-22: qty 50

## 2015-01-22 MED ORDER — FENTANYL CITRATE 0.05 MG/ML IJ SOLN
100.0000 ug | Freq: Once | INTRAMUSCULAR | Status: AC
  Administered 2015-01-22: 100 ug via INTRAVENOUS

## 2015-01-22 MED ORDER — ADENOSINE 6 MG/2ML IV SOLN
INTRAVENOUS | Status: AC
  Administered 2015-01-22: 12 mg
  Filled 2015-01-22: qty 4

## 2015-01-22 MED ORDER — HEPARIN SODIUM (PORCINE) 1000 UNIT/ML DIALYSIS
1000.0000 [IU] | INTRAMUSCULAR | Status: DC | PRN
  Administered 2015-01-25: 2200 [IU] via INTRAVENOUS_CENTRAL
  Administered 2015-01-28: 2400 [IU] via INTRAVENOUS_CENTRAL
  Filled 2015-01-22: qty 6
  Filled 2015-01-22: qty 3
  Filled 2015-01-22 (×2): qty 6
  Filled 2015-01-22: qty 4

## 2015-01-22 MED ORDER — HEPARIN BOLUS VIA INFUSION
1000.0000 [IU] | Freq: Once | INTRAVENOUS | Status: AC
  Administered 2015-01-22: 1000 [IU] via INTRAVENOUS
  Filled 2015-01-22: qty 1000

## 2015-01-22 MED ORDER — PRISMASOL BGK 4/2.5 32-4-2.5 MEQ/L IV SOLN
INTRAVENOUS | Status: DC
  Administered 2015-01-22 – 2015-01-30 (×31): via INTRAVENOUS_CENTRAL
  Filled 2015-01-22 (×39): qty 5000

## 2015-01-22 MED ORDER — PIPERACILLIN-TAZOBACTAM IN DEX 2-0.25 GM/50ML IV SOLN
2.2500 g | Freq: Four times a day (QID) | INTRAVENOUS | Status: DC
  Administered 2015-01-23 – 2015-01-29 (×27): 2.25 g via INTRAVENOUS
  Filled 2015-01-22 (×28): qty 50

## 2015-01-22 MED ORDER — CHLORHEXIDINE GLUCONATE 0.12 % MT SOLN
15.0000 mL | Freq: Two times a day (BID) | OROMUCOSAL | Status: DC
  Administered 2015-01-22 – 2015-01-30 (×16): 15 mL via OROMUCOSAL
  Filled 2015-01-22 (×14): qty 15

## 2015-01-22 NOTE — Progress Notes (Signed)
UR Completed.  [phone]  

## 2015-01-22 NOTE — Consult Note (Signed)
Requesting Physician:  Dr. Lake Bells Reason for Consult:  Anuric AKI on CKD, worsening metabolic acidosis HPI: The patient is a 54 y.o. year-old AAF with SLE on chronic cell cept and plaquenil, known CKD (see's Dr. [name] at [facility]) with a remote h/o membranous GN (presume but I do not know for sure if lupus membranous) by biopsy in 2007. She has had progressive CKD. Last seen by Dr. [name] on 12/26/14 with a creatinine of 3.5 and she was being teed up for eventual dialysis ([facility], [facility] referral).  She was started on EPO for significant anemia and takes sodium bicarbonate chronically for acidosis.  She was admitted on the current occasion with shortness of breath, cough, congestion, diarrhea (worse past 3 weeks), abdominal pain. She has had fevers, tachycardia (HR in the 150's sustained), and earlier in the admission had some hypotension into the 80's though through the evening tonight she has become hypertensive. . She developed worsening hypoxemia, with subsequent need for intubation. The etiology of her respiratory failure has been somewhat elusive - ? Viral vs PE (too unstable for V/Q and contrast being avoided b/c of her CKD) vs some other underlying infection since she has been immunocompromised on cellcept.  So far tests for influenza negative, blood cultures negative to date, stool screen for gi pathogens pending, and no further diarrhea so CDiff not collected.  She is receiving IV steroids, vancomycin, zosyn, IV heparin (b/c cannot test to r/o PE; venous dopplers negative for PE).  WBC now up to 26,000  Since admission her UOP has progressively dropped (80 cc total today) with worsening metabolic acidosis and creatinine is up to 4.5. Despite being 6 liters + and having progressive hypoxemia her CVP is only 5.   CREATININE, SER  Date/Time Value Ref Range Status  01/22/2015 05:28 PM 4.50* 0.50 - 1.10 mg/dL Final  01/21/2015 03:05 AM 2.73* 0.50 - 1.10 mg/dL Final  01/20/2015 04:27 AM 3.29* 0.50 -  1.10 mg/dL Final  01/19/2015 05:00 AM 3.27* 0.50 - 1.10 mg/dL Final  01/18/2015 06:40 PM 2.97* 0.50 - 1.10 mg/dL Final  12/26/14 3.5  (at [facility])    11/24/2014 06:00 AM 2.99* 0.50 - 1.10 mg/dL Final  11/23/2014 07:41 AM 2.87* 0.50 - 1.10 mg/dL Final  11/22/2014 05:30 AM 2.95* 0.50 - 1.10 mg/dL Final  11/21/2014 05:16 AM 2.71* 0.50 - 1.10 mg/dL Final  11/20/2014 05:10 AM 2.62* 0.50 - 1.10 mg/dL Final  11/19/2014 06:03 AM 3.24* 0.50 - 1.10 mg/dL Final  11/18/2014 11:24 PM 3.57* 0.50 - 1.10 mg/dL Final  11/06/2014 01:00 PM 4.50* 0.50 - 1.10 mg/dL Final  10/20/2014 05:44 AM 3.27* 0.50 - 1.10 mg/dL Final  10/18/2014 04:32 AM 3.21* 0.50 - 1.10 mg/dL Final  10/16/2014 05:47 AM 3.37* 0.50 - 1.10 mg/dL Final  10/15/2014 05:24 AM 3.26* 0.50 - 1.10 mg/dL Final  10/14/2014 04:50 AM 3.27* 0.50 - 1.10 mg/dL Final  10/13/2014 03:16 AM 3.37* 0.50 - 1.10 mg/dL Final  10/12/2014 02:51 AM 4.19* 0.50 - 1.10 mg/dL Final  10/11/2014 04:17 AM 4.48* 0.50 - 1.10 mg/dL Final  10/10/2014 07:26 AM 4.45* 0.50 - 1.10 mg/dL Final  10/06/2014 06:11 AM 3.04* 0.50 - 1.10 mg/dL Final  10/05/2014 05:21 AM 2.86* 0.50 - 1.10 mg/dL Final  10/04/2014 02:58 AM 3.21* 0.50 - 1.10 mg/dL Final  10/02/2014 04:54 AM 2.80* 0.50 - 1.10 mg/dL Final  09/12/2009 05:47 PM 0.54 0.4 - 1.2 mg/dL Final  08/03/2009 04:04 PM 0.53 0.4 - 1.2 mg/dL Final  04/06/2009 02:52 PM  0.50 0.4 - 1.2 mg/dL Final  10/24/2008 06:08 AM 0.58 0.4 - 1.2 mg/dL Final  10/23/2008 05:15 PM 0.59 0.4 - 1.2 mg/dL Final  10/23/2008 03:54 AM 0.42 0.4 - 1.2 mg/dL Final  10/22/2008 05:59 PM 0.7 0.4 - 1.2 mg/dL Final  04/13/2007 04:22 PM 0.36*  Final    Past Medical History  Diagnosis Date  . Hypertension   . Lupus   . CKD (chronic kidney disease)     due to lupus/Dr. [name]  . Diabetes mellitus   . Stenosis of cervical spine region     with HNP at C5/6, C6/7  . Metatarsal bone fracture right    4th  . 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  Marland Kitchen Membranous  glomerulonephritis     bx 07/2006  . Arthritis     RA  . Anemia   . Stroke 11/2014    left sided weakness, dysphagia  . Pain in joints   . Paresthesias   . Hyperlipidemia   . Colitis     ? per hospital notes 11/2014  . Lower GI bleed   . Hyponatremia   . Metabolic acidosis   . Hyperplastic colon polyp   . Hemorrhoids   . Blood transfusion without reported diagnosis     Past Surgical History  Procedure Laterality Date  . Breast biopsy    . Tubal ligation      Family History  Problem Relation Age of Onset  . Hypertension    . Lupus    . Rheum arthritis    . Hypertension Mother   . Diabetes Mother   . Hypertension Sister   . Diabetes Father   . Hypertension Maternal Grandmother    Social History:  reports that she has quit smoking. Her smoking use included Cigarettes. She has a 15 pack-year smoking history. She has never used smokeless tobacco. She reports that she does not drink alcohol or use illicit drugs.  Allergies  Allergen Reactions  . Sulfa Antibiotics Itching    Home medications: Prior to Admission medications   Medication Sig Start Date End Date Taking? Authorizing Provider  acetaminophen (TYLENOL) 325 MG tablet Take 2 tablets (650 mg total) by mouth every 4 (four) hours as needed for mild pain. 10/25/14  Yes Ivan Anchors Love, PA-C  ALPRAZolam (XANAX) 0.5 MG tablet Take 1 tablet (0.5 mg total) by mouth 2 (two) times daily as needed for anxiety. 10/25/14  Yes Ivan Anchors Love, PA-C  gabapentin (NEURONTIN) 100 MG capsule Take 1 capsule (100 mg total) by mouth at bedtime. Patient taking differently: Take 100 mg by mouth 3 (three) times daily.  11/25/14  Yes Orson Eva, MD  labetalol (NORMODYNE) 300 MG tablet Take 2 tablets (600 mg total) by mouth 2 (two) times daily. 11/25/14  Yes Orson Eva, MD  mycophenolate (CELLCEPT) 250 MG capsule Take 4 capsules (1,000 mg total) by mouth 2 (two) times daily. 10/25/14  Yes Ivan Anchors Love, PA-C  senna-docusate (SENOKOT-S) 8.6-50 MG per  tablet Take 1 tablet by mouth 2 (two) times daily. Patient taking differently: Take 1 tablet by mouth 2 (two) times daily as needed.  10/25/14  Yes Ivan Anchors Love, PA-C  sodium bicarbonate 650 MG tablet Take 2 tablets (1,300 mg total) by mouth 2 (two) times daily. 10/25/14  Yes Ivan Anchors Love, PA-C  Vitamin D, Ergocalciferol, (DRISDOL) 50000 UNITS CAPS capsule Take 1 capsule (50,000 Units total) by mouth every Wednesday. 10/25/14  Yes Bary Leriche, PA-C  atorvastatin (LIPITOR) 20 MG tablet Take 1 tablet (20 mg total) by mouth daily at 6 PM. 10/25/14   Ivan Anchors Love, PA-C  cloNIDine (CATAPRES) 0.3 MG tablet Take 1 tablet (0.3 mg total) by mouth 3 (three) times daily. 10/25/14   Bary Leriche, PA-C  clotrimazole (MYCELEX) 10 MG troche Take 1 tablet (10 mg total) by mouth 5 (five) times daily. 11/25/14   Orson Eva, MD  cyclobenzaprine (FLEXERIL) 5 MG tablet Take 5 mg by mouth 3 (three) times daily as needed for muscle spasms.  10/10/14   Historical Provider, MD  diclofenac sodium (VOLTAREN) 1 % GEL Apply 2 g topically 4 (four) times daily. To forefoot 10/25/14   Ivan Anchors Love, PA-C  ferrous sulfate 325 (65 FE) MG tablet Take 1 tablet (325 mg total) by mouth daily with breakfast. 10/25/14   Ivan Anchors Love, PA-C  furosemide (LASIX) 80 MG tablet Take 80 mg by mouth 2 (two) times daily. 10/25/14   Historical Provider, MD  hydrALAZINE (APRESOLINE) 25 MG tablet Take 25 mg by mouth every 8 (eight) hours. 10/25/14   Historical Provider, MD  hydroxychloroquine (PLAQUENIL) 200 MG tablet Take 2 tablets (400 mg total) by mouth daily. 10/25/14   Bary Leriche, PA-C  lidocaine (XYLOCAINE) 2 % solution  11/06/14   Historical Provider, MD  Menthol-Methyl Salicylate (MUSCLE RUB) 10-15 % CREA Apply 1 application topically 3 (three) times daily before meals. 10/25/14   Bary Leriche, PA-C  multivitamin (RENA-VIT) TABS tablet Take 1 tablet by mouth daily.    Historical Provider, MD  nystatin-triamcinolone (MYCOLOG II) cream  Apply topically 2 (two) times daily. 11/06/14   Historical Provider, MD  nystatin-triamcinolone ointment (MYCOLOG) Apply 1 application topically 2 (two) times daily. 11/06/14   Mercedes Camprubi-Soms, PA-C  pantoprazole (PROTONIX) 40 MG tablet Take 1 tablet (40 mg total) by mouth daily. 12/07/14   Laban Emperor Zehr, PA-C  pramoxine-hydrocortisone (PROCTOCREAM-HC) 1-1 % rectal cream Place 1 application rectally 2 (two) times daily. Patient taking differently: Place 1 application rectally 2 (two) times daily as needed for hemorrhoids or itching.  12/07/14   Loralie Champagne, PA-C    Inpatient medications: . [START ON 01/23/2015] antiseptic oral rinse  7 mL Mouth Rinse QID  . chlorhexidine  15 mL Mouth Rinse BID  . famotidine (PEPCID) IV  20 mg Intravenous Q24H  . ferrous sulfate  325 mg Oral BID WC  . folic acid  1 mg Oral Daily  . hydrocortisone-pramoxine  1 applicator Rectal BID  . multivitamin  1 tablet Oral QHS  . piperacillin-tazobactam (ZOSYN)  IV  3.375 g Intravenous 3 times per day  . sodium bicarbonate  1,300 mg Oral BID  . sodium chloride  3 mL Intravenous Q12H  . sodium chloride  3 mL Intravenous Q12H  . thiamine  100 mg Oral Daily  . vancomycin  500 mg Intravenous Q24H  . [START ON 01/24/2015] Vitamin D (Ergocalciferol)  50,000 Units Oral Q Wed  Infusions: . sodium chloride 50 mL/hr at 01/21/15 1100  . diltiazem (CARDIZEM) infusion 15 mg/hr (01/21/15 1100)  . heparin 2,500 Units/hr (01/22/15 1705)  . propofol 49.834 mcg/kg/min (01/22/15 1854)    Review of Systems Unobtainable as patient is intubated and sedated  Physical Exam: BP 185/96 mmHg  Pulse 149  Temp(Src) 99.1 F (37.3 C) (Oral)  Resp 19  Wt 60.192 kg (132 lb 11.2 oz)  SpO2 100%  LMP 07/18/2012 CVP 5 Gen: Slight AAF. Intubated. Skin: no rash, cyanosis Neck:  no JVD, no bruits or LAN Chest: Anteriorly fairly clear Heart: Tachy at 150 S1S2 No S3 Cannot appreciate a murmur Abdomen: Quiet. No bowel sounds to my exam.  Grimaces and withdraws with palpation of the RUQ/RLQ areas Ext: SCD's in place. No sig edema. Very low muscle mass Neuro: Intubated, sedated Heme/Lymph: no bruising or LAN   Recent Labs Lab 01/18/15 1840 01/19/15 0500 01/20/15 0427 01/21/15 0305 01/22/15 1728  NA 135 137 135 134* 139  K 4.3 3.9 3.9 3.9 4.3  CL 105 107 105 104 108  CO2 20 18* 21 14* 18*  GLUCOSE 144* 82 90 86 64*  BUN 38* 38* 39* 32* 41*  CREATININE 2.97* 3.27* 3.29* 2.73* 4.50*  CALCIUM 6.6* 6.4* 6.2* 6.8* 6.4*  PHOS  --   --  4.2  --   --     Recent Labs Lab 01/19/15 0500 01/20/15 0427 01/21/15 1630  AST 14  --  17  ALT 8  --  7  ALKPHOS 58  --  63  BILITOT 0.7  --  0.6  PROT 6.1  --  5.9*  ALBUMIN 2.2* 2.3* 2.3*    Recent Labs Lab 01/21/15 1630  LIPASE 18  AMYLASE 98    Recent Labs Lab 01/18/15 1840  01/19/15 0500 01/20/15 0427 01/21/15 0305 01/22/15 0245  WBC 13.3*  --  8.3  --  12.3* 26.3*  HGB 7.1*  < > 8.6* 7.4* 8.6* 8.8*  HCT 22.4*  < > 26.6* 23.4* 26.1* 27.9*  MCV 83.9  --  82.6  --  82.1 85.3  PLT 279  --  215  --  265 251  < > = values in this interval not displayed.  Recent Labs Lab 01/18/15 2255 01/19/15 0500 01/19/15 0956 01/21/15 1630 01/21/15 2308  TROPONINI 0.57* 0.44* 0.38* 0.65* 0.74*     Recent Labs Lab 01/21/15 1556 01/22/15 0740 01/22/15 0829 01/22/15 1659 01/22/15 1753  GLUCAP 115* 67* 135* 66* 79     ABG    Component Value Date/Time   PHART 7.269* 01/21/2015 1753   PCO2ART 30.6* 01/21/2015 1753   PO2ART 154.0* 01/21/2015 1753   HCO3 13.8* 01/21/2015 1753   TCO2 15 01/21/2015 1753   ACIDBASEDEF 12.0* 01/21/2015 1753   O2SAT 99.0 01/21/2015 1753  Results for [facility], Stefani A (MRN [id number]) as of 01/22/2015 21:00  Ref. Range 01/21/2015 16:23 01/22/2015 02:45  Procalcitonin Latest Units: ng/mL 1.12 3.21    Xrays/Other Studies: Dg Chest Port 1 View  01/22/2015   CLINICAL DATA:  Acute respiratory failure.  EXAM: PORTABLE CHEST - 1 VIEW   COMPARISON:  01/21/2015  FINDINGS: Support devices are in stable position. Improving bilateral airspace opacities, likely improving edema. Heart size is borderline. No effusions. No acute bony abnormality.  IMPRESSION: Improving bilateral airspace opacities.   Electronically Signed   By: Rolm Baptise M.D.   On: 01/22/2015 16:03   Dg Chest Portable 1 View  01/21/2015   CLINICAL DATA:  Status post central line placement  EXAM: PORTABLE CHEST - 1 VIEW  COMPARISON:  Film from earlier in the same day  FINDINGS: Endotracheal tube is now been placed in lies approximately 4.8 cm above the carina. A left jugular central line is noted in the proximal superior vena cava. A nasogastric catheter is noted within the stomach. Diffuse increased bilateral infiltrates are identified. No pneumothorax is identified. No bony abnormality is seen.  IMPRESSION: Increasing bilateral infiltrates.  Tubes and lines as described above.  No pneumothorax is  noted.   Electronically Signed   By: Inez Catalina M.D.   On: 01/21/2015 15:07   Dg Chest Port 1 View  01/21/2015   CLINICAL DATA:  Dyspnea, postprandial  EXAM: PORTABLE CHEST - 1 VIEW  COMPARISON:  01/18/2015  FINDINGS: There are mildly prominent left ventricular contours, unchanged. The lungs are clear. Minimal blunting of the lateral costophrenic angles may represent tiny effusions. Pulmonary vasculature is normal.  IMPRESSION: Possible tiny pleural effusions.   Electronically Signed   By: Andreas Newport M.D.   On: 01/21/2015 03:32   Dg Abd Portable 1v  01/21/2015   CLINICAL DATA:  Sudden onset abdominal pain with dyspnea, postprandial  EXAM: PORTABLE ABDOMEN - 1 VIEW  COMPARISON:  CT 11/19/2014  FINDINGS: The bowel gas pattern is normal. No radio-opaque calculi or other significant radiographic abnormality are seen.  IMPRESSION: Negative.   Electronically Signed   By: Andreas Newport M.D.   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 She is 6 liters + at time of consultation though CVP is low at 5.    Impression/Plan 1. AKI on CKD4 - Oligoanuric. 6 liters + but CVP low, K all right, progressive acidosis. Creatinine of 4.5 = GFR of  10 or less given her very low muscle mass.  CCM to place catheter. Will start CRRT to enable administration of peripheral bicarb, keep volume even if repeat CVP's indeed low, all 4K fluids, transition to HD as we can (she has plenty of BP right now but with her sustained HR's in the 150's, CRRT a little less hemodynamically stressful than intermittent HD) 2. Metabolic acidosis - peripheral isotonic bicarb at 75/hour. 3. Resp failure - infection vs PE. Per CCM. Immunosuppressive meds on hold 4. Fever/leukocytosis - on empiric ATB's. Procalcitonin rising.  5. Sustained tachycardia - SVT vs sinus tachy. Trops mildly up. EF 40-45%. ? Cards? 6. Diarrhea - no longer having. GI pathogens pending. Unable to collect CDiff. 7. SLE - currently just getting steroids. Cellcept and plaquenil on hold 8. Anemia - Aranesp. Check Fe studies 9. Secondary hyperpara - last PTH 161 outpt CKA - calcitriol when able to use gi tract 10. Abdominal pain - ? CT? (plain films unremarkable)  Jamal Maes,  MD [facility] [phone] pager 01/22/2015, 8:42 PM

## 2015-01-22 NOTE — Progress Notes (Signed)
*  PRELIMINARY RESULTS* Vascular Ultrasound Lower extremity venous duplex has been completed.  Preliminary findings: Technically limited due to immobility. No obvious DVT noted.   Landry Mellow, RDMS, RVT  01/22/2015, 11:56 AM

## 2015-01-22 NOTE — Progress Notes (Signed)
ANTICOAGULATION CONSULT NOTE - Follow Up Consult  Pharmacy Consult for Heparin  Indication: pulmonary embolus, RULE OUT  Allergies  Allergen Reactions  . Sulfa Antibiotics Itching    Patient Measurements: Weight: 132 lb 11.2 oz (60.192 kg) Vital Signs: Temp: 99.7 F (37.6 C) (03/21 0900) BP: 153/88 mmHg (03/21 0900) Pulse Rate: 147 (03/21 0900)  Labs:  Recent Labs  01/20/15 0427 01/21/15 0305 01/21/15 1630 01/21/15 1905 01/21/15 2308 01/22/15 0245 01/22/15 1415  HGB 7.4* 8.6*  --   --   --  8.8*  --   HCT 23.4* 26.1*  --   --   --  27.9*  --   PLT  --  265  --   --   --  251  --   HEPARINUNFRC  --   --   --  0.88*  --  0.16* 0.15*  CREATININE 3.29* 2.73*  --   --   --   --   --   TROPONINI  --   --  0.65*  --  0.74*  --   --     Estimated Creatinine Clearance: 21.7 mL/min (by C-G formula based on Cr of 2.73).  Assessment: 54 y/o on heparin drip for r/o PE. Initial heparin level was likely high at 0.88 due to the lab being drawn only 3 hours after bolus. Repeat heparin levels have been sub-therapeutic. No s/s of bleeding noted. H/H low but stable.   Goal of Therapy:  Heparin level 0.3-0.7 units/ml Monitor platelets by anticoagulation protocol: Yes   Plan:  -Repeat Heparin 1000 units bolus, then increase heparin infusion to 1200 units/hr.  -HL at midnight   -Daily CBC/HL -Monitor for bleeding -F/U PE work-up  Albertina Parr, PharmD., BCPS Clinical Pharmacist Pager [phone]

## 2015-01-22 NOTE — Progress Notes (Addendum)
NUTRITION FOLLOW UP  DOCUMENTATION CODES Per approved criteria  -Severe malnutrition in the context of chronic illness       Intervention:    If unable to extubate within next 24 hours, recommend initiate TF via OGT with Vital AF 1.2 at 25 ml/h and Prostat 30 ml BID on day 1; on day 2, d/c Prostat and increase to goal rate of 50 ml/h (1200 ml per day) to provide 1440 kcals, 90 gm protein, 973 ml free water daily.  Above TF regimen plus current Propofol dose will provide a total of 1678 kcals (97% of estimated needs).  New Nutrition Dx:   Inadequate oral intake related to inability to eat as evidenced by NPO status.  Goal:   Intake to meet >90% of estimated nutrition needs.  Monitor:   TF initiation/tolerance/adequacy, weight trend, labs, vent status.  Assessment:   Patient admitted with weakness and recent weight loss. History of lupus, CKD, DM. Patient with frequent diarrhea within 45 minutes after eating since starting Cellcept in November. She has lost 20 lbs in the past 3-4 months.  Developed intermittent SVT and worsening SOB overnight 3/19, required intubation on 3/20. Discussed patient in ICU rounds and with RN today. No plans to start enteral nutrition at this time.  Patient is currently intubated on ventilator support MV: 9.2 L/min Temp (24hrs), Avg:100.5 F (38.1 C), Min:99.3 F (37.4 C), Max:102.1 F (38.9 C)  Propofol: 9 ml/hr providing 238 kcals per day.   Height: Ht Readings from Last 1 Encounters:  12/07/14 5\' 5"  (1.651 m)    Weight Status:   Wt Readings from Last 1 Encounters:  01/20/15 132 lb 11.2 oz (60.192 kg)   01/19/15 124 lb 11.2 oz (56.564 kg)        Re-estimated needs:  Kcal: 1737 Protein: 80-95 gm Fluid: 1.8 L  Skin: stage 2 pressure ulcer to left buttocks  Diet Order: Diet NPO time specified   Intake/Output Summary (Last 24 hours) at 01/22/15 1314 Last data filed at 01/22/15 0900  Gross per 24 hour  Intake 1858.55 ml   Output     80 ml  Net 1778.55 ml    Last BM: 3/19   Labs:   Recent Labs Lab 01/19/15 0500 01/20/15 0427 01/21/15 0305  NA 137 135 134*  K 3.9 3.9 3.9  CL 107 105 104  CO2 18* 21 14*  BUN 38* 39* 32*  CREATININE 3.27* 3.29* 2.73*  CALCIUM 6.4* 6.2* 6.8*  PHOS  --  4.2  --   GLUCOSE 82 90 86    CBG (last 3)   Recent Labs  01/21/15 1556 01/22/15 0740 01/22/15 0829  GLUCAP 115* 67* 135*    Scheduled Meds: . famotidine (PEPCID) IV  20 mg Intravenous Q24H  . ferrous sulfate  325 mg Oral BID WC  . folic acid  1 mg Oral Daily  . hydrocortisone-pramoxine  1 applicator Rectal BID  . multivitamin  1 tablet Oral QHS  . piperacillin-tazobactam (ZOSYN)  IV  3.375 g Intravenous 3 times per day  . sodium bicarbonate  1,300 mg Oral BID  . sodium chloride  3 mL Intravenous Q12H  . sodium chloride  3 mL Intravenous Q12H  . thiamine  100 mg Oral Daily  . vancomycin  500 mg Intravenous Q24H  . [START ON 01/24/2015] Vitamin D (Ergocalciferol)  50,000 Units Oral Q Wed    Continuous Infusions: . sodium chloride 50 mL/hr at 01/21/15 1100  . diltiazem (CARDIZEM) infusion 15 mg/hr (  01/21/15 1100)  . heparin 1,000 Units/hr (01/22/15 0501)  . propofol 50 mcg/kg/min (01/22/15 1120)    Molli Barrows, RD, LDN, [place] Pager [phone] After Hours Pager [phone]

## 2015-01-22 NOTE — Progress Notes (Signed)
ANTICOAGULATION CONSULT NOTE - Follow Up Consult  Pharmacy Consult for Heparin  Indication: pulmonary embolus, RULE OUT  Allergies  Allergen Reactions  . Sulfa Antibiotics Itching    Patient Measurements: Weight: 132 lb 11.2 oz (60.192 kg) Vital Signs: Temp: 100 F (37.8 C) (03/21 0200) BP: 140/81 mmHg (03/21 0100) Pulse Rate: 140 (03/21 0310)  Labs:  Recent Labs  01/19/15 0500 01/19/15 0956 01/20/15 0427 01/21/15 0305 01/21/15 1630 01/21/15 1905 01/21/15 2308 01/22/15 0245  HGB 8.6*  --  7.4* 8.6*  --   --   --  8.8*  HCT 26.6*  --  23.4* 26.1*  --   --   --  27.9*  PLT 215  --   --  265  --   --   --  251  HEPARINUNFRC  --   --   --   --   --  0.88*  --  0.16*  CREATININE 3.27*  --  3.29* 2.73*  --   --   --   --   TROPONINI 0.44* 0.38*  --   --  0.65*  --  0.74*  --     Estimated Creatinine Clearance: 21.7 mL/min (by C-G formula based on Cr of 2.73).  Assessment: 54 y/o on heparin drip for r/o PE. Initial heparin level was likely high at 0.88 due to the lab being drawn only 3 hours after bolus, so will increase heparin back to initial rate.   Goal of Therapy:  Heparin level 0.3-0.7 units/ml Monitor platelets by anticoagulation protocol: Yes   Plan:  -Increase heparin to 1000 units/hr -1300 HL -Daily CBC/HL -Monitor for bleeding -F/U PE work-up  Narda Bonds 01/22/2015,4:57 AM

## 2015-01-22 NOTE — Procedures (Signed)
Hemodialysis Catheter Insertion Procedure Note [name] [name] [id number] [birth year]  Procedure: Insertion of Hemodialysis Catheter Indications: CVVH  Procedure Details Consent: Risks of procedure as well as the alternatives and risks of each were explained to the (patient/caregiver).  Consent for procedure obtained. Time Out: Verified patient identification, verified procedure, site/side was marked, verified correct patient position, special equipment/implants available, medications/allergies/relevent history reviewed, required imaging and test results available.  Performed  Maximum sterile technique was used including antiseptics, cap, gloves, gown, hand hygiene, mask and sheet. Skin prep: Chlorhexidine; local anesthetic administered A antimicrobial bonded/coated triple lumen catheter was placed in the right internal jugular vein using the Seldinger technique.  Evaluation Blood flow good Complications: No apparent complications Patient did tolerate procedure well. Chest X-ray ordered to verify placement.  CXR: pending.  Procedure performed under direct ultrasound guidance for real time vessel cannulation.      Montey Hora, [facility] Pgr: [phone]  or [phone] 01/22/2015, 9:52 PM

## 2015-01-22 NOTE — Progress Notes (Signed)
ANTIBIOTIC CONSULT NOTE - Follow-up  Pharmacy Consult for Vanc/Zosyn Indication: fevers - r/o PNA  Allergies  Allergen Reactions  . Sulfa Antibiotics Itching    Patient Measurements: Weight: 132 lb 11.2 oz (60.192 kg)  Vital Signs: Temp: 99.1 F (37.3 C) (03/21 2020) BP: 185/96 mmHg (03/21 2020) Pulse Rate: 149 (03/21 2020) Intake/Output from previous day: 03/20 0701 - 03/21 0700 In: 2068.1 [I.V.:1293.1; IV Piggyback:775] Out: 80 [Urine:80] Intake/Output from this shift: Total I/O In: 68 [I.V.:68] Out: 10 [Urine:10]  Labs:  Recent Labs  01/20/15 0427 01/21/15 0305 01/22/15 0245 01/22/15 1728  WBC  --  12.3* 26.3*  --   HGB 7.4* 8.6* 8.8*  --   PLT  --  265 251  --   CREATININE 3.29* 2.73*  --  4.50*   Estimated Creatinine Clearance: 13.2 mL/min (by C-G formula based on Cr of 4.5). No results for input(s): VANCOTROUGH, VANCOPEAK, VANCORANDOM, GENTTROUGH, GENTPEAK, GENTRANDOM, TOBRATROUGH, TOBRAPEAK, TOBRARND, AMIKACINPEAK, AMIKACINTROU, AMIKACIN in the last 72 hours.   Microbiology: Recent Results (from the past 720 hour(s))  MRSA PCR Screening     Status: None   Collection Time: 01/21/15 10:04 AM  Result Value Ref Range Status   MRSA by PCR NEGATIVE NEGATIVE Final    Comment:        The GeneXpert MRSA Assay (FDA approved for NASAL specimens only), is one component of a comprehensive MRSA colonization surveillance program. It is not intended to diagnose MRSA infection nor to guide or monitor treatment for MRSA infections.   Culture, blood (routine x 2)     Status: None (Preliminary result)   Collection Time: 01/21/15 11:30 AM  Result Value Ref Range Status   Specimen Description BLOOD LEFT ASSIST CONTROL  Final   Special Requests BOTTLES DRAWN AEROBIC ONLY 8CC  Final   Culture   Final           BLOOD CULTURE RECEIVED NO GROWTH TO DATE CULTURE WILL BE HELD FOR 5 DAYS BEFORE ISSUING A FINAL NEGATIVE REPORT Performed at [facility]    Report  Status PENDING  Incomplete    Assessment: 54 yo female on Vancomycin and Zosyn (Day #2) for fevers/sepsis - unknown origin. Pt is immunosuppressed (cellcept and plaquenil). Pt to start CRRT tonight after cath placed.  Goal of Therapy:  Vancomycin trough level 15-20 mcg/ml  Plan:  Change Zosyn to 2.25gm IV q6h Continue Vancomycin 500mg  IV q24h Measure antibiotic drug levels at steady state Follow up culture results  Sherlon Handing, PharmD, BCPS Clinical pharmacist, pager [phone] 01/22/2015 9:42 PM

## 2015-01-22 NOTE — Progress Notes (Signed)
[facility] Progress Note Patient Name: [name] [name] DOB: August 09, 1961 MRN: [id number]   Date of Service  01/22/2015  HPI/Events of Note  Worsening renal function, bicarb and pH Now oliguric  eICU Interventions  Renal consulted Will have NP place HD cath for CVVHD     Intervention Category Intermediate Interventions: Diagnostic test evaluation  Pollyann Roa 01/22/2015, 7:47 PM

## 2015-01-22 NOTE — Progress Notes (Signed)
PULMONARY / CRITICAL CARE MEDICINE   Name: [name] [name] MRN: [id number] DOB: 1961-10-04    ADMISSION DATE:  01/18/2015 CONSULTATION DATE:  3/20  REFERRING MD :  Eliseo Squires   CHIEF COMPLAINT:  Tachycardia, resp failure   INITIAL PRESENTATION:  54 yo female with hx lupus (on cellcept, plaquenil), CKD, DM, HTN initially presented 3/17 with vague c/o SOB, diarrhea, malaise, weakness, cough.  She was admitted by [facility] and r/o for flu.  Was undergoing further w/u for diarrhea, mildly elevated troponin and anemia.  Overnight 3/19 developed intermittent SVT, then persistent tachycardia with HR 150's and worsening SOB and hypoxia requiring NRB.  PCCM consulted 3/20.   STUDIES:  CXR 3/20>>>neg  2D echo 3/19>>> KK93-81%, grade 1 diastolic dysfunction, mod AR, mod MR, mod TR. No pericardial effusion.  LE doppler US 3/20 >> prelim result = negative >>   SIGNIFICANT EVENTS: Adenosine challenge 3/21 >> appears to be sinus rhythm with some non-conducted p-waves  HISTORY OF PRESENT ILLNESS: 54 yo female with hx lupus, CKD, DM, HTN initially presented 3/17 with vague c/o SOB, diarrhea, malaise, weakness, cough.  She was admitted by [facility] and r/o for flu.  Was undergoing further w/u for diarrhea, mildly elevated troponin and anemia.  Overnight 3/19 developed intermittent SVT, then persistent tachycardia with HR 150's and worsening SOB and hypoxia requiring NRB.  PCCM consulted 3/20.   Per pt, SOB is acutely worse since last night.  Does c/o cough, fever, chills, malaise, diarrhea.  Denies chest pain, lightheadedness, purulent sputum, hemoptysis, abd pain, vomiting.     SUBJECTIVE:  Intubated and sedated, remains tachycardic to 130's  VITAL SIGNS: Temp:  [99.3 F (37.4 C)-102.1 F (38.9 C)] 99.7 F (37.6 C) (03/21 0900) Pulse Rate:  [130-148] 147 (03/21 0900) Resp:  [0-25] 14 (03/21 0900) BP: (84-185)/(61-106) 153/88 mmHg (03/21 0900) SpO2:  [97 %-100 %] 100 % (03/21 0900) FiO2 (%):  [40 %-80 %] 40 %  (03/21 1120) HEMODYNAMICS:   VENTILATOR SETTINGS: Vent Mode:  [-] PRVC FiO2 (%):  [40 %-80 %] 40 % Set Rate:  [20 bmp] 20 bmp Vt Set:  [480 mL-509 mL] 509 mL PEEP:  [10 cmH20] 10 cmH20 Plateau Pressure:  [18 cmH20-28 cmH20] 26 cmH20 INTAKE / OUTPUT:  Intake/Output Summary (Last 24 hours) at 01/22/15 1400 Last data filed at 01/22/15 0900  Gross per 24 hour  Intake 1808.55 ml  Output     80 ml  Net 1728.55 ml    PHYSICAL EXAMINATION: General:  Chronically ill appearing female, intubated and sedated Neuro:  Wakes to voice, moves spontaneously but did not follow commands.  HEENT:  Mm dry, ETT in place Cardiovascular:  s1s2 tachy 130 Lungs:  Soft B insp crackles Abdomen:  round, mildly distended, soft, mildly tender diffusely  Musculoskeletal:  Warm and dry, gray appearing, no edema  LABS:  CBC  Recent Labs Lab 01/19/15 0500 01/20/15 0427 01/21/15 0305 01/22/15 0245  WBC 8.3  --  12.3* 26.3*  HGB 8.6* 7.4* 8.6* 8.8*  HCT 26.6* 23.4* 26.1* 27.9*  PLT 215  --  265 251   Coag's No results for input(s): APTT, INR in the last 168 hours. BMET  Recent Labs Lab 01/19/15 0500 01/20/15 0427 01/21/15 0305  NA 137 135 134*  K 3.9 3.9 3.9  CL 107 105 104  CO2 18* 21 14*  BUN 38* 39* 32*  CREATININE 3.27* 3.29* 2.73*  GLUCOSE 82 90 86   Electrolytes  Recent Labs Lab 01/19/15 0500 01/20/15 0427  01/21/15 0305  CALCIUM 6.4* 6.2* 6.8*  PHOS  --  4.2  --    Sepsis Markers  Recent Labs Lab 01/18/15 1858 01/21/15 1623 01/21/15 1646 01/22/15 0245  LATICACIDVEN 1.48  --  0.9  --   PROCALCITON  --  1.12  --  3.21   ABG  Recent Labs Lab 01/21/15 0955 01/21/15 1753  PHART 7.329* 7.269*  PCO2ART 27.8* 30.6*  PO2ART 77.0* 154.0*   Liver Enzymes  Recent Labs Lab 01/19/15 0500 01/20/15 0427 01/21/15 1630  AST 14  --  17  ALT 8  --  7  ALKPHOS 58  --  63  BILITOT 0.7  --  0.6  ALBUMIN 2.2* 2.3* 2.3*   Cardiac Enzymes  Recent Labs Lab  01/19/15 0956 01/21/15 1630 01/21/15 2308  TROPONINI 0.38* 0.65* 0.74*   Glucose  Recent Labs Lab 01/21/15 0952 01/21/15 1152 01/21/15 1301 01/21/15 1556 01/22/15 0740 01/22/15 0829  GLUCAP 135* 100* 104* 115* 67* 135*    Imaging Dg Chest Portable 1 View  01/21/2015   CLINICAL DATA:  Status post central line placement  EXAM: PORTABLE CHEST - 1 VIEW  COMPARISON:  Film from earlier in the same day  FINDINGS: Endotracheal tube is now been placed in lies approximately 4.8 cm above the carina. A left jugular central line is noted in the proximal superior vena cava. A nasogastric catheter is noted within the stomach. Diffuse increased bilateral infiltrates are identified. No pneumothorax is identified. No bony abnormality is seen.  IMPRESSION: Increasing bilateral infiltrates.  Tubes and lines as described above.  No pneumothorax is noted.   Electronically Signed   By: Inez Catalina M.D.   On: 01/21/2015 15:07   Dg Chest Port 1 View  01/21/2015   CLINICAL DATA:  Dyspnea, postprandial  EXAM: PORTABLE CHEST - 1 VIEW  COMPARISON:  01/18/2015  FINDINGS: There are mildly prominent left ventricular contours, unchanged. The lungs are clear. Minimal blunting of the lateral costophrenic angles may represent tiny effusions. Pulmonary vasculature is normal.  IMPRESSION: Possible tiny pleural effusions.   Electronically Signed   By: Andreas Newport M.D.   On: 01/21/2015 03:32   Dg Abd Portable 1v  01/21/2015   CLINICAL DATA:  Sudden onset abdominal pain with dyspnea, postprandial  EXAM: PORTABLE ABDOMEN - 1 VIEW  COMPARISON:  CT 11/19/2014  FINDINGS: The bowel gas pattern is normal. No radio-opaque calculi or other significant radiographic abnormality are seen.  IMPRESSION: Negative.   Electronically Signed   By: Andreas Newport M.D.   On: 01/21/2015 03:26     ASSESSMENT / PLAN:  PULMONARY Acute hypoxic respiratory failure - unknown etiology. ?viral illness v PE v decompensated resp status in  setting extended SVT/sinus tach v underlying infection in immunocompromised pt. Consider lupus pneumonitis.  P:   Vent support  Hold VQ scan for now. Cannot CTA chest to r/o PE r/t CKD > continue to anticoagulate since PE high in the DDx F/u ABG  F/u CXR  HR control   CARDIOVASCULAR SVT/sinus tach - HR 150's despite cardizem, B blockade. Adenosine given, appears to be sinus with some non-conducted p-waves Mildly elevated troponin peak 0.74 dCHF - new.  EF 40-45% with MOD aortic, tricuspid and mitral regrurg.  ??PE BLE venous dopplers prelim negative P:  Will ask cardiology opinion regarding rhythm strip after adenosine Repeat trop am 3/22 Empiric heparin gtt for ??PE   RENAL CKD - baseline Scr 2.6-2.66m w oliguria  Hyponatremia  P:   Follow  BMP and UOP May need to involve renal depending on trend   GASTROINTESTINAL Diarrhea  abd pain - neg abd xray  P:   CDiff, stool cultures pending, no longer having stools NPO  pepcid   HEMATOLOGIC Leukocytosis  Anemia  P:  Heparin gtt as above  Cont iron  F/u CBC  INFECTIOUS Fever  Unclear etiology of infection but suspect PNA in immunocompromised host.  ??GI given diarrhea v viral v other source (ie lupus pneumonitis).  Immunocompromised on rx for lupus. Note leukocytosis and rising Pct P:   BCx2 3/20>>> UC 3/20>>>  Vanc 3/20 (empiric) >>> Zosyn 3/20 (empiric) >>>  Pan culture pending C diff pending Empiric broad spectrum abx  Hold cellcept, plaquenil temporarily   ENDOCRINE No active issue    P:   Monitor glucose on chem   NEUROLOGIC Sedation needs  P:   RASS goal: -1 PRN fent/ versed while on vent    FAMILY  - Updates:  Mother updated at beside 3/20, sister on 3/21  - Inter-disciplinary family meet or Palliative Care meeting due by: 3/26  Independent CC time 12 minutes  Baltazar Apo, MD, PhD 01/22/2015, 2:00 PM [facility] [phone] or if no answer [phone]

## 2015-01-22 NOTE — Progress Notes (Signed)
Pt heart rate sustained in 140's and urine output less than 30 cc/ hour.  Elink Dr. Ree Kida.  Patient given 500 cc normal saline bolus.  Post bolus, patient still maintaining elevated heart rate and limited urine output.  Rito Ehrlich, RN.

## 2015-01-23 ENCOUNTER — Inpatient Hospital Stay (HOSPITAL_COMMUNITY): Payer: Managed Care, Other (non HMO)

## 2015-01-23 LAB — GI PATHOGEN PANEL BY PCR, STOOL
C DIFFICILE TOXIN A/B: NOT DETECTED
CRYPTOSPORIDIUM BY PCR: NOT DETECTED
Campylobacter by PCR: NOT DETECTED
E COLI (ETEC) LT/ST: NOT DETECTED
E COLI (STEC): NOT DETECTED
E COLI 0157 BY PCR: NOT DETECTED
G lamblia by PCR: NOT DETECTED
NOROVIRUS G1/G2: NOT DETECTED
ROTAVIRUS A BY PCR: NOT DETECTED
SHIGELLA BY PCR: NOT DETECTED
Salmonella by PCR: NOT DETECTED

## 2015-01-23 LAB — RENAL FUNCTION PANEL
ALBUMIN: 2 g/dL — AB (ref 3.5–5.2)
ALBUMIN: 2 g/dL — AB (ref 3.5–5.2)
ANION GAP: 13 (ref 5–15)
Anion gap: 14 (ref 5–15)
BUN: 28 mg/dL — ABNORMAL HIGH (ref 6–23)
BUN: 18 mg/dL (ref 6–23)
CHLORIDE: 103 mmol/L (ref 96–112)
CO2: 21 mmol/L (ref 19–32)
CO2: 22 mmol/L (ref 19–32)
CREATININE: 3.36 mg/dL — AB (ref 0.50–1.10)
Calcium: 6.3 mg/dL — CL (ref 8.4–10.5)
Calcium: 7 mg/dL — ABNORMAL LOW (ref 8.4–10.5)
Chloride: 103 mmol/L (ref 96–112)
Creatinine, Ser: 2.58 mg/dL — ABNORMAL HIGH (ref 0.50–1.10)
GFR calc Af Amer: 23 mL/min — ABNORMAL LOW (ref >90)
GFR calc non Af Amer: 20 mL/min — ABNORMAL LOW (ref >90)
GFR, EST AFRICAN AMERICAN: 17 mL/min — AB (ref >90)
GFR, EST NON AFRICAN AMERICAN: 15 mL/min — AB (ref >90)
GLUCOSE: 84 mg/dL (ref 70–99)
Glucose, Bld: 116 mg/dL — ABNORMAL HIGH (ref 70–99)
POTASSIUM: 3.4 mmol/L — AB (ref 3.5–5.1)
Phosphorus: 4.1 mg/dL (ref 2.3–4.6)
Phosphorus: 2.6 mg/dL (ref 2.3–4.6)
Potassium: 3.6 mmol/L (ref 3.5–5.1)
Sodium: 138 mmol/L (ref 135–145)
Sodium: 138 mmol/L (ref 135–145)

## 2015-01-23 LAB — GLUCOSE, CAPILLARY
GLUCOSE-CAPILLARY: 91 mg/dL (ref 70–99)
GLUCOSE-CAPILLARY: 92 mg/dL (ref 70–99)
Glucose-Capillary: 64 mg/dL — ABNORMAL LOW (ref 70–99)
Glucose-Capillary: 68 mg/dL — ABNORMAL LOW (ref 70–99)
Glucose-Capillary: 73 mg/dL (ref 70–99)
Glucose-Capillary: 73 mg/dL (ref 70–99)
Glucose-Capillary: 90 mg/dL (ref 70–99)

## 2015-01-23 LAB — BLOOD GAS, ARTERIAL
ACID-BASE DEFICIT: 8 mmol/L — AB (ref 0.0–2.0)
Bicarbonate: 16.8 mEq/L — ABNORMAL LOW (ref 20.0–24.0)
Drawn by: 31101
FIO2: 0.4 %
O2 Saturation: 98.7 %
PATIENT TEMPERATURE: 98.6
PCO2 ART: 32.9 mmHg — AB (ref 35.0–45.0)
PEEP: 10 cmH2O
RATE: 20 resp/min
TCO2: 17.8 mmol/L (ref 0–100)
VT: 480 mL
pH, Arterial: 7.327 — ABNORMAL LOW (ref 7.350–7.450)
pO2, Arterial: 165 mmHg — ABNORMAL HIGH (ref 80.0–100.0)

## 2015-01-23 LAB — HEPARIN LEVEL (UNFRACTIONATED)
HEPARIN UNFRACTIONATED: 0.89 [IU]/mL — AB (ref 0.30–0.70)
Heparin Unfractionated: 0.14 IU/mL — ABNORMAL LOW (ref 0.30–0.70)
Heparin Unfractionated: 0.92 IU/mL — ABNORMAL HIGH (ref 0.30–0.70)

## 2015-01-23 LAB — CBC
HEMATOCRIT: 22.5 % — AB (ref 36.0–46.0)
Hemoglobin: 7.4 g/dL — ABNORMAL LOW (ref 12.0–15.0)
MCH: 26.9 pg (ref 26.0–34.0)
MCHC: 32.9 g/dL (ref 30.0–36.0)
MCV: 81.8 fL (ref 78.0–100.0)
Platelets: 219 10*3/uL (ref 150–400)
RBC: 2.75 MIL/uL — ABNORMAL LOW (ref 3.87–5.11)
RDW: 15.8 % — ABNORMAL HIGH (ref 11.5–15.5)
WBC: 12 10*3/uL — ABNORMAL HIGH (ref 4.0–10.5)

## 2015-01-23 LAB — PROCALCITONIN: Procalcitonin: 1.81 ng/mL

## 2015-01-23 LAB — MAGNESIUM: Magnesium: 1.2 mg/dL — ABNORMAL LOW (ref 1.5–2.5)

## 2015-01-23 MED ORDER — SODIUM CHLORIDE 0.9 % IV SOLN
25.0000 ug/h | INTRAVENOUS | Status: DC
  Administered 2015-01-23: 100 ug/h via INTRAVENOUS
  Administered 2015-01-24: 400 ug/h via INTRAVENOUS
  Administered 2015-01-24: 250 ug/h via INTRAVENOUS
  Administered 2015-01-25: 300 ug/h via INTRAVENOUS
  Administered 2015-01-25: 200 ug/h via INTRAVENOUS
  Administered 2015-01-26 (×2): 300 ug/h via INTRAVENOUS
  Administered 2015-01-26: 250 ug/h via INTRAVENOUS
  Administered 2015-01-27 – 2015-01-28 (×4): 350 ug/h via INTRAVENOUS
  Administered 2015-01-28: 250 ug/h via INTRAVENOUS
  Administered 2015-01-28: 350 ug/h via INTRAVENOUS
  Administered 2015-01-29: 300 ug/h via INTRAVENOUS
  Filled 2015-01-23 (×17): qty 50

## 2015-01-23 MED ORDER — HYDRALAZINE HCL 20 MG/ML IJ SOLN
10.0000 mg | Freq: Four times a day (QID) | INTRAMUSCULAR | Status: DC | PRN
  Administered 2015-01-27: 20 mg via INTRAVENOUS
  Administered 2015-01-28: 10 mg via INTRAVENOUS
  Administered 2015-01-28 – 2015-01-29 (×3): 20 mg via INTRAVENOUS
  Filled 2015-01-23 (×4): qty 1

## 2015-01-23 MED ORDER — FENTANYL BOLUS VIA INFUSION
50.0000 ug | INTRAVENOUS | Status: DC | PRN
  Administered 2015-01-24 – 2015-01-27 (×7): 50 ug via INTRAVENOUS
  Filled 2015-01-23: qty 50

## 2015-01-23 MED ORDER — HEPARIN BOLUS VIA INFUSION
2000.0000 [IU] | Freq: Once | INTRAVENOUS | Status: AC
  Administered 2015-01-23: 2000 [IU] via INTRAVENOUS
  Filled 2015-01-23: qty 2000

## 2015-01-23 MED ORDER — MAGNESIUM SULFATE 2 GM/50ML IV SOLN
2.0000 g | Freq: Once | INTRAVENOUS | Status: AC
  Administered 2015-01-23: 2 g via INTRAVENOUS
  Filled 2015-01-23: qty 50

## 2015-01-23 MED ORDER — DEXTROSE 50 % IV SOLN
INTRAVENOUS | Status: AC
  Administered 2015-01-23: 25 mL
  Filled 2015-01-23: qty 50

## 2015-01-23 MED ORDER — FENTANYL CITRATE 0.05 MG/ML IJ SOLN
50.0000 ug | Freq: Once | INTRAMUSCULAR | Status: AC
  Administered 2015-01-23: 50 ug via INTRAVENOUS

## 2015-01-23 NOTE — Progress Notes (Addendum)
PULMONARY / CRITICAL CARE MEDICINE   Name: [name] [name] MRN: [id number] DOB: 05-22-1961    ADMISSION DATE:  01/18/2015 CONSULTATION DATE:  3/20  REFERRING MD :  Eliseo Squires   CHIEF COMPLAINT:  Tachycardia, resp failure   INITIAL PRESENTATION:  54 yo female with hx lupus (on cellcept, plaquenil) and associated CKD, DM, HTN initially presented 3/17 with vague c/o SOB, diarrhea, malaise, weakness, cough.  She was admitted by [facility] and r/o for flu.  Was undergoing further w/u for diarrhea, mildly elevated troponin and anemia.  Overnight 3/19 developed intermittent SVT, then persistent tachycardia with HR 150's and worsening SOB and hypoxia requiring NRB.  PCCM consulted 3/20.  She required ETT/MV 3/20. CXR with B infiltrates. Initiated CVVHD on 3/21 pm.   STUDIES:  CXR 3/20>>>neg  2D echo 3/19>>> MW10-27%, grade 1 diastolic dysfunction, mod AR, mod MR, mod TR. No pericardial effusion.  LE doppler US 3/20 >> prelim result = negative >>   SIGNIFICANT EVENTS: Adenosine challenge 3/21 >> appears to be sinus rhythm with some non-conducted p-waves  SUBJECTIVE:  Progressive oliguric renal failure and acidosis overnight, started on continuous HD, Na bicarb  VITAL SIGNS: Temp:  [95 F (35 C)-99.7 F (37.6 C)] 98.2 F (36.8 C) (03/22 1200) Pulse Rate:  [110-149] 110 (03/22 1200) Resp:  [0-28] 20 (03/22 1200) BP: (109-194)/(42-116) 119/69 mmHg (03/22 1200) SpO2:  [100 %] 100 % (03/22 1200) FiO2 (%):  [40 %] 40 % (03/22 1200) Weight:  [60.5 kg (133 lb 6.1 oz)] 60.5 kg (133 lb 6.1 oz) (03/22 0500) HEMODYNAMICS: CVP:  [0 mmHg-10 mmHg] 0 mmHg VENTILATOR SETTINGS: Vent Mode:  [-] PRVC FiO2 (%):  [40 %] 40 % Set Rate:  [20 bmp] 20 bmp Vt Set:  [480 mL-501 mL] 480 mL PEEP:  [10 cmH20] 10 cmH20 Plateau Pressure:  [23 OZD66-44 cmH20] 23 cmH20 INTAKE / OUTPUT:  Intake/Output Summary (Last 24 hours) at 01/23/15 1309 Last data filed at 01/23/15 1200  Gross per 24 hour  Intake 3222.43 ml  Output    2257 ml  Net 965.43 ml    PHYSICAL EXAMINATION: General:  Chronically ill appearing female, intubated and sedated Neuro:  Wakes to voice, moves spontaneously but does not follow commands.  HEENT:  Mm dry, ETT in place Cardiovascular:  s1s2 tachy but improved to 110's Lungs:  Soft B insp crackles Abdomen:  round, mildly distended, soft, mildly tender diffusely  Musculoskeletal:  Warm and dry, gray appearing, no edema  LABS:  CBC  Recent Labs Lab 01/21/15 0305 01/22/15 0245 01/23/15 0520  WBC 12.3* 26.3* 12.0*  HGB 8.6* 8.8* 7.4*  HCT 26.1* 27.9* 22.5*  PLT 265 251 219   Coag's No results for input(s): APTT, INR in the last 168 hours. BMET  Recent Labs Lab 01/21/15 0305 01/22/15 1728 01/23/15 0520  NA 134* 139 138  K 3.9 4.3 3.6  CL 104 108 103  CO2 14* 18* 21  BUN 32* 41* 28*  CREATININE 2.73* 4.50* 3.36*  GLUCOSE 86 64* 116*   Electrolytes  Recent Labs Lab 01/20/15 0427 01/21/15 0305 01/22/15 1728 01/23/15 0520  CALCIUM 6.2* 6.8* 6.4* 6.3*  MG  --   --   --  1.2*  PHOS 4.2  --   --  4.1   Sepsis Markers  Recent Labs Lab 01/18/15 1858 01/21/15 1623 01/21/15 1646 01/22/15 0245 01/23/15 0520  LATICACIDVEN 1.48  --  0.9  --   --   PROCALCITON  --  1.12  --  3.21 1.81   ABG  Recent Labs Lab 01/21/15 0955 01/21/15 1753 01/23/15 0312  PHART 7.329* 7.269* 7.327*  PCO2ART 27.8* 30.6* 32.9*  PO2ART 77.0* 154.0* 165.0*   Liver Enzymes  Recent Labs Lab 01/19/15 0500 01/20/15 0427 01/21/15 1630 01/23/15 0520  AST 14  --  17  --   ALT 8  --  7  --   ALKPHOS 58  --  63  --   BILITOT 0.7  --  0.6  --   ALBUMIN 2.2* 2.3* 2.3* 2.0*   Cardiac Enzymes  Recent Labs Lab 01/19/15 0956 01/21/15 1630 01/21/15 2308  TROPONINI 0.38* 0.65* 0.74*   Glucose  Recent Labs Lab 01/22/15 1950 01/22/15 2104 01/23/15 0024 01/23/15 0327 01/23/15 0808 01/23/15 1132  GLUCAP 59* 111* 68* 91 92 90    Imaging Dg Chest Port 1 View  01/22/2015    CLINICAL DATA:  Central line placement  EXAM: PORTABLE CHEST - 1 VIEW  COMPARISON:  01/22/2015 at 15:15  FINDINGS: There is a new right jugular central line with tip in the SVC the azygos vein junction. The left jugular central line extends into the SVC. Endotracheal tube tip is 4 cm above the carina. Nasogastric tube extends into the stomach. Mild airspace opacities persist in the central and basilar regions with slight continuing improvement.  IMPRESSION: Support equipment appears satisfactorily positioned.  New right jugular central line extends to the SVC at the azygos vein junction  No pneumothorax  Continued improvement in the airspace opacity   Electronically Signed   By: Andreas Newport M.D.   On: 01/22/2015 22:49   Dg Chest Port 1 View  01/22/2015   CLINICAL DATA:  Acute respiratory failure.  EXAM: PORTABLE CHEST - 1 VIEW  COMPARISON:  01/21/2015  FINDINGS: Support devices are in stable position. Improving bilateral airspace opacities, likely improving edema. Heart size is borderline. No effusions. No acute bony abnormality.  IMPRESSION: Improving bilateral airspace opacities.   Electronically Signed   By: Rolm Baptise M.D.   On: 01/22/2015 16:03     ASSESSMENT / PLAN:  PULMONARY Acute hypoxic respiratory failure - unknown etiology but in setting B infiltrates. Have considered and treated for HCAP, PE. Consider acute pulmonary edema in setting HTN and renal failure. Also consider lupus pneumonitis although infiltrates have almost completely cleared. Most likely cause is cardiogenic edema.  P:   Vent support  Continue heparin gtt for now but consider d/c soon if believe resp failure more likely due to infiltrates Have considered the possibility of steroids for lupus pneumonitis but her CXR has quickly cleared (with no therapy) making this very unlikely F/u ABG  F/u CXR  HR and BP control  Volume removal with HD  CARDIOVASCULAR SVT/sinus tach - HR was 150 at time of her acute  decompensation, Adenosine given, appears to be sinus with some non-conducted p-waves Mildly elevated troponin peak 0.74 dCHF - new.  EF 40-45% with MOD aortic, tricuspid and mitral regrurg.  ??PE BLE venous dopplers prelim negative; becoming less suspicious that this was the case P:  On empiric heparin gtt Hydralazine prn   RENAL Acute on chronic renal failure, presume lupus nephritis.  Hyponatremia  metabolic acidosis P:   Appreciate renal service's assistance Continue CVVHD for now, transition to intermittent soon. She is not expected to recover her renal fxn D/c bicarb gtt  GASTROINTESTINAL Diarrhea - resolved abd pain - neg abd xray  P:   CDiff, stool cultures pending, no longer having stools Start  TF's pepcid   HEMATOLOGIC Leukocytosis  Anemia  P:  Heparin gtt as above  Cont iron  F/u CBC  INFECTIOUS Fever  Unclear etiology of infection, consider PNA in immunocompromised host but CXR has quickly cleared.  ??GI given diarrhea v viral v other source (ie lupus pneumonitis).  Immunocompromised on rx for lupus. Note leukocytosis and rising Pct P:   BCx2 3/20>>> UC 3/20>>> negative  Vanc 3/20 (empiric) >>> 3/22 Zosyn 3/20 (empiric) >>>  Pan culture pending C diff cancelled as no further diarrhea Empiric broad spectrum abx > plan d/c vanco on 3/22 Hold cellcept, plaquenil temporarily   ENDOCRINE No active issue    P:   Monitor glucose on chem   NEUROLOGIC Sedation needs  P:   RASS goal: -1 PRN fent/ versed while on vent    FAMILY  - Updates:  Mother updated at beside 3/20, sister on 3/21 and 29  - Inter-disciplinary family meet or Palliative Care meeting due by: 3/26  Independent CC time 43 minutes  Baltazar Apo, MD, PhD 01/23/2015, 1:09 PM [facility] [phone] or if no answer [phone]

## 2015-01-23 NOTE — Progress Notes (Signed)
[facility] ROUNDING NOTE   Subjective:   Interval History: intubated and sedated BP controlled  Objective:  Vital signs in last 24 hours:  Temp:  [95 F (35 C)-99.7 F (37.6 C)] 97 F (36.1 C) (03/22 1000) Pulse Rate:  [112-149] 112 (03/22 1000) Resp:  [0-28] 20 (03/22 1000) BP: (87-194)/(42-116) 151/79 mmHg (03/22 1000) SpO2:  [100 %] 100 % (03/22 1000) FiO2 (%):  [40 %] 40 % (03/22 0800) Weight:  [60.5 kg (133 lb 6.1 oz)] 60.5 kg (133 lb 6.1 oz) (03/22 0500)  Weight change:  Filed Weights   01/19/15 2127 01/20/15 2100 01/23/15 0500  Weight: 56.564 kg (124 lb 11.2 oz) 60.192 kg (132 lb 11.2 oz) 60.5 kg (133 lb 6.1 oz)    Intake/Output: I/O last 3 completed shifts: In: 3934.9 [I.V.:3009.9; NG/GT:25; IV [id number]:235] Out: 5732 [Urine:210; KGURK:2706]   Intake/Output this shift:  Total I/O In: 585.7 [I.V.:455.7; Other:20; NG/GT:60; IV Piggyback:50] Out: 28 [Other:541]  CVS- RRR RS- CTA ABD- BS present soft non-distended EXT- no edema   Basic Metabolic Panel:  Recent Labs Lab 01/19/15 0500 01/20/15 0427 01/21/15 0305 01/22/15 1728 01/23/15 0520  NA 137 135 134* 139 138  K 3.9 3.9 3.9 4.3 3.6  CL 107 105 104 108 103  CO2 18* 21 14* 18* 21  GLUCOSE 82 90 86 64* 116*  BUN 38* 39* 32* 41* 28*  CREATININE 3.27* 3.29* 2.73* 4.50* 3.36*  CALCIUM 6.4* 6.2* 6.8* 6.4* 6.3*  MG  --   --   --   --  1.2*  PHOS  --  4.2  --   --  4.1    Liver Function Tests:  Recent Labs Lab 01/19/15 0500 01/20/15 0427 01/21/15 1630 01/23/15 0520  AST 14  --  17  --   ALT 8  --  7  --   ALKPHOS 58  --  63  --   BILITOT 0.7  --  0.6  --   PROT 6.1  --  5.9*  --   ALBUMIN 2.2* 2.3* 2.3* 2.0*    Recent Labs Lab 01/21/15 1630  LIPASE 18  AMYLASE 98   No results for input(s): AMMONIA in the last 168 hours.  CBC:  Recent Labs Lab 01/18/15 1840  01/19/15 0500 01/20/15 0427 01/21/15 0305 01/22/15 0245 01/23/15 0520  WBC 13.3*  --  8.3  --  12.3*  26.3* 12.0*  HGB 7.1*  < > 8.6* 7.4* 8.6* 8.8* 7.4*  HCT 22.4*  < > 26.6* 23.4* 26.1* 27.9* 22.5*  MCV 83.9  --  82.6  --  82.1 85.3 81.8  PLT 279  --  215  --  265 251 219  < > = values in this interval not displayed.  Cardiac Enzymes:  Recent Labs Lab 01/18/15 2255 01/19/15 0500 01/19/15 0956 01/21/15 1630 01/21/15 2308  TROPONINI 0.57* 0.44* 0.38* 0.65* 0.74*    BNP: Invalid input(s): POCBNP  CBG:  Recent Labs Lab 01/22/15 1950 01/22/15 2104 01/23/15 0024 01/23/15 0327 01/23/15 0808  GLUCAP 59* 111* 68* 91 92    Microbiology: Results for orders placed or performed during the hospital encounter of 01/18/15  MRSA PCR Screening     Status: None   Collection Time: 01/21/15 10:04 AM  Result Value Ref Range Status   MRSA by PCR NEGATIVE NEGATIVE Final    Comment:        The GeneXpert MRSA Assay (FDA approved for NASAL specimens only), is one component of a comprehensive  MRSA colonization surveillance program. It is not intended to diagnose MRSA infection nor to guide or monitor treatment for MRSA infections.   Culture, blood (routine x 2)     Status: None (Preliminary result)   Collection Time: 01/21/15 11:30 AM  Result Value Ref Range Status   Specimen Description BLOOD LEFT ASSIST CONTROL  Final   Special Requests BOTTLES DRAWN AEROBIC ONLY 8CC  Final   Culture   Final           BLOOD CULTURE RECEIVED NO GROWTH TO DATE CULTURE WILL BE HELD FOR 5 DAYS BEFORE ISSUING A FINAL NEGATIVE REPORT Performed at [facility]    Report Status PENDING  Incomplete  Culture, Urine     Status: None   Collection Time: 01/21/15  4:24 PM  Result Value Ref Range Status   Specimen Description URINE, CATHETERIZED  Final   Special Requests Immunocompromised  Final   Colony Count NO GROWTH Performed at [facility]   Final   Culture NO GROWTH Performed at [facility]   Final   Report Status 01/22/2015 FINAL  Final    Coagulation Studies: No  results for input(s): LABPROT, INR in the last 72 hours.  Urinalysis:  Recent Labs  01/21/15 1624  COLORURINE YELLOW  LABSPEC 1.019  PHURINE 5.5  GLUCOSEU NEGATIVE  HGBUR SMALL*  BILIRUBINUR NEGATIVE  KETONESUR 15*  PROTEINUR >300*  UROBILINOGEN 0.2  NITRITE NEGATIVE  LEUKOCYTESUR NEGATIVE      Imaging: Dg Chest Port 1 View  01/23/2015   CLINICAL DATA:  Acute respiratory failure  EXAM: PORTABLE CHEST - 1 VIEW  COMPARISON:  01/22/2015  FINDINGS: ET tube is 5 cm above the carina. Left jugular central line extends into the SVC. Right jugular central line extends into the SVC. Mild left base airspace opacity persists. There has been continued clearance of central airspace opacities, now off nearly completely resolved.  IMPRESSION: Support equipment appears satisfactorily positioned.  Continued improvement, with mild residual left base airspace opacity.   Electronically Signed   By: Andreas Newport M.D.   On: 01/23/2015 04:50   Dg Chest Port 1 View  01/22/2015   CLINICAL DATA:  Central line placement  EXAM: PORTABLE CHEST - 1 VIEW  COMPARISON:  01/22/2015 at 15:15  FINDINGS: There is a new right jugular central line with tip in the SVC the azygos vein junction. The left jugular central line extends into the SVC. Endotracheal tube tip is 4 cm above the carina. Nasogastric tube extends into the stomach. Mild airspace opacities persist in the central and basilar regions with slight continuing improvement.  IMPRESSION: Support equipment appears satisfactorily positioned.  New right jugular central line extends to the SVC at the azygos vein junction  No pneumothorax  Continued improvement in the airspace opacity   Electronically Signed   By: Andreas Newport M.D.   On: 01/22/2015 22:49   Dg Chest Port 1 View  01/22/2015   CLINICAL DATA:  Acute respiratory failure.  EXAM: PORTABLE CHEST - 1 VIEW  COMPARISON:  01/21/2015  FINDINGS: Support devices are in stable position. Improving bilateral  airspace opacities, likely improving edema. Heart size is borderline. No effusions. No acute bony abnormality.  IMPRESSION: Improving bilateral airspace opacities.   Electronically Signed   By: Rolm Baptise M.D.   On: 01/22/2015 16:03   Dg Chest Portable 1 View  01/21/2015   CLINICAL DATA:  Status post central line placement  EXAM: PORTABLE CHEST - 1 VIEW  COMPARISON:  Film from earlier in the same day  FINDINGS: Endotracheal tube is now been placed in lies approximately 4.8 cm above the carina. A left jugular central line is noted in the proximal superior vena cava. A nasogastric catheter is noted within the stomach. Diffuse increased bilateral infiltrates are identified. No pneumothorax is identified. No bony abnormality is seen.  IMPRESSION: Increasing bilateral infiltrates.  Tubes and lines as described above.  No pneumothorax is noted.   Electronically Signed   By: Inez Catalina M.D.   On: 01/21/2015 15:07     Medications:   . sodium chloride 50 mL/hr at 01/21/15 1100  . dextrose 5 % 1,000 mL with sodium bicarbonate 150 mEq infusion 75 mL/hr at 01/22/15 2303  . diltiazem (CARDIZEM) infusion 15 mg/hr (01/21/15 1100)  . heparin 1,300 Units/hr (01/23/15 0924)  . dialysis replacement fluid (prismasate) 300 mL/hr at 01/22/15 2345  . dialysis replacement fluid (prismasate) 200 mL/hr at 01/22/15 2345  . dialysate (PRISMASATE) 1,000 mL/hr at 01/22/15 2345  . propofol 30 mcg/kg/min (01/23/15 0922)   . antiseptic oral rinse  7 mL Mouth Rinse QID  . chlorhexidine  15 mL Mouth Rinse BID  . famotidine (PEPCID) IV  20 mg Intravenous Q24H  . ferrous sulfate  325 mg Oral BID WC  . folic acid  1 mg Oral Daily  . hydrocortisone-pramoxine  1 applicator Rectal BID  . multivitamin  1 tablet Oral QHS  . piperacillin-tazobactam (ZOSYN)  IV  2.25 g Intravenous 4 times per day  . sodium chloride  3 mL Intravenous Q12H  . sodium chloride  3 mL Intravenous Q12H  . thiamine  100 mg Oral Daily  . vancomycin  500  mg Intravenous Q24H  . [START ON 01/24/2015] Vitamin D (Ergocalciferol)  50,000 Units Oral Q Wed   sodium chloride, acetaminophen, fentaNYL, fentaNYL, heparin, heparin, menthol-cetylpyridinium, midazolam, midazolam, ondansetron **OR** ondansetron (ZOFRAN) IV, sodium chloride  Assessment/ Plan:  SLE on chronic cell cept and plaquenil, known CKD progressive CKD. Last seen by Dr. [name] on 12/26/14 with a creatinine of 3.5 and she was being teed up for eventual dialysis (TOPS Education, [facility] referral). 1. AKI on CKD4 -  CRRT a little less hemodynamically stressful than intermittent HD) 2. Metabolic acidosis - peripheral isotonic bicarb at 75/hour. 3. Resp failure - infection vs PE. Per CCM. Immunosuppressive meds on hold 4. Fever/leukocytosis - on empiric ATB's. Procalcitonin rising.  5. Sustained tachycardia - SVT vs sinus tachy. Trops mildly up. EF 40-45%.  6. Diarrhea - no longer having. GI pathogens pending. Unable to collect CDiff. 7. SLE - currently just getting steroids. Cellcept and plaquenil on hold 8. Anemia - Aranesp. 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

## 2015-01-23 NOTE — Progress Notes (Signed)
CRITICAL VALUE ALERT  Critical value received:  Ca 6.3  Date of notification:  3/22  Time of notification:  0617  Critical value read back:Yes.    Nurse who received alert:  Beatrix Shipper RN  MD notified (1st page):  [phone]  Time of first page:    MD notified (2nd page):  Time of second page:  Responding MD:  Deterding MD  Time MD responded:  [phone]

## 2015-01-23 NOTE — Progress Notes (Signed)
[place] for Heparin  Indication: r/o PE  Allergies  Allergen Reactions  . Sulfa Antibiotics Itching    Patient Measurements: Weight: 133 lb 6.1 oz (60.5 kg) Vital Signs: Temp: 96.7 F (35.9 C) (03/22 1900) Temp Source: Core (Comment) (03/22 1600) BP: 127/69 mmHg (03/22 1900) Pulse Rate: 31 (03/22 1725)  Labs:  Recent Labs  01/21/15 0305 01/21/15 1630  01/21/15 2308 01/22/15 0245  01/22/15 1728 01/23/15 01/23/15 0520 01/23/15 0800 01/23/15 1520 01/23/15 1756  HGB 8.6*  --   --   --  8.8*  --   --   --  7.4*  --   --   --   HCT 26.1*  --   --   --  27.9*  --   --   --  22.5*  --   --   --   PLT 265  --   --   --  251  --   --   --  219  --   --   --   HEPARINUNFRC  --   --   < >  --  0.16*  < >  --  0.14*  --  0.92*  --  0.89*  CREATININE 2.73*  --   --   --   --   --  4.50*  --  3.36*  --  2.58*  --   TROPONINI  --  0.65*  --  0.74*  --   --   --   --   --   --   --   --   < > = values in this interval not displayed.  Estimated Creatinine Clearance: 23 mL/min (by C-G formula based on Cr of 2.58).  Assessment: 54 y/o on heparin drip for r/o PE. Heparin level remains supra-therapeutic (0.89) - only decreased a little bit after rate change this a.m. No bleeding noted per RN.  Goal of Therapy:  Heparin level 0.3-0.7 units/ml Monitor platelets by anticoagulation protocol: Yes   Plan:  Decrease heparin infusion to 1150 units/hr  Follow up 8 hr HL Monitor daily HL, CBC and s/s of bleeding   Sherlon Handing, PharmD, BCPS Clinical pharmacist, pager [phone] 01/23/2015 7:34 PM

## 2015-01-23 NOTE — Progress Notes (Signed)
[place] for Heparin  Indication: r/o PE  Allergies  Allergen Reactions  . Sulfa Antibiotics Itching    Patient Measurements: Weight: 132 lb 11.2 oz (60.192 kg) Vital Signs: Temp: 99 F (37.2 C) (03/21 2312) BP: 109/86 mmHg (03/21 2312) Pulse Rate: 140 (03/21 2312)  Labs:  Recent Labs  01/20/15 0427 01/21/15 0305 01/21/15 1630  01/21/15 2308 01/22/15 0245 01/22/15 1415 01/22/15 1728 01/23/15  HGB 7.4* 8.6*  --   --   --  8.8*  --   --   --   HCT 23.4* 26.1*  --   --   --  27.9*  --   --   --   PLT  --  265  --   --   --  251  --   --   --   HEPARINUNFRC  --   --   --   < >  --  0.16* 0.15*  --  0.14*  CREATININE 3.29* 2.73*  --   --   --   --   --  4.50*  --   TROPONINI  --   --  0.65*  --  0.74*  --   --   --   --   < > = values in this interval not displayed.  Estimated Creatinine Clearance: 13.2 mL/min (by C-G formula based on Cr of 4.5).  Assessment: 54 y/o female with SOB, possible PE, for heparin   Goal of Therapy:  Heparin level 0.3-0.7 units/ml Monitor platelets by anticoagulation protocol: Yes   Plan:  Heparin 2000 units IV bolus, then increase heparin 1400 units/hr Follow-up am labs.  Phillis Knack, PharmD, BCPS

## 2015-01-23 NOTE — Progress Notes (Signed)
[place] for Heparin  Indication: r/o PE  Allergies  Allergen Reactions  . Sulfa Antibiotics Itching    Patient Measurements: Weight: 133 lb 6.1 oz (60.5 kg) Vital Signs: Temp: 95.1 F (35.1 C) (03/22 0800) BP: 161/92 mmHg (03/22 0800) Pulse Rate: 125 (03/22 0339)  Labs:  Recent Labs  01/21/15 0305 01/21/15 1630  01/21/15 2308 01/22/15 0245 01/22/15 1415 01/22/15 1728 01/23/15 01/23/15 0520 01/23/15 0800  HGB 8.6*  --   --   --  8.8*  --   --   --  7.4*  --   HCT 26.1*  --   --   --  27.9*  --   --   --  22.5*  --   PLT 265  --   --   --  251  --   --   --  219  --   HEPARINUNFRC  --   --   < >  --  0.16* 0.15*  --  0.14*  --  0.92*  CREATININE 2.73*  --   --   --   --   --  4.50*  --  3.36*  --   TROPONINI  --  0.65*  --  0.74*  --   --   --   --   --   --   < > = values in this interval not displayed.  Estimated Creatinine Clearance: 17.6 mL/min (by C-G formula based on Cr of 3.36).  Assessment: 54 y/o on heparin drip for r/o PE. Heparin level this AM is supra-therapeutic at 0.92. This is up significantly since the last level. RN reports no s/s of bleeding. H/H has trended down to 7.4/22.5 today. Plt wnl.   Goal of Therapy:  Heparin level 0.3-0.7 units/ml Monitor platelets by anticoagulation protocol: Yes   Plan:  Decrease heparin infusion to 1300 units/hr  Follow up 8 hr HL Monitor daily HL, CBC and s/s of bleeding   Albertina Parr, PharmD., BCPS Clinical Pharmacist Pager [phone]

## 2015-01-23 NOTE — Progress Notes (Signed)
Hypoglycemic Event  CBG: 64  Treatment: 1/2 amp d50  Symptoms: none  Follow-up CBG: Time:2343CBG Result:169  Possible Reasons for Event: npo    Wendi Snipes  Remember to initiate Hypoglycemia Order Set & complete

## 2015-01-23 NOTE — Progress Notes (Signed)
[name] Progress Note Patient Name: [name] [name] DOB: Mar 01, 1961 MRN: [id number]   Date of Service  01/23/2015  HPI/Events of Note  Patient with hypocalcemia in the setting of SLE and renal insufficiency.  Also with low Mag on CRRt  eICU Interventions  Plan: Hold on calcium replacement for now Check intact PTH - hypocalcemia may be related to secondary hyperPTH Mag replaced     Intervention Category Intermediate Interventions: Electrolyte abnormality - evaluation and management Minor Interventions: Routine modifications to care plan (e.g. PRN medications for pain, fever)  Dawnn Nam 01/23/2015, 6:21 AM

## 2015-01-24 LAB — RENAL FUNCTION PANEL
ALBUMIN: 1.8 g/dL — AB (ref 3.5–5.2)
ANION GAP: 7 (ref 5–15)
Albumin: 1.8 g/dL — ABNORMAL LOW (ref 3.5–5.2)
Albumin: 2.1 g/dL — ABNORMAL LOW (ref 3.5–5.2)
Anion gap: 10 (ref 5–15)
Anion gap: 7 (ref 5–15)
BUN: 14 mg/dL (ref 6–23)
BUN: 10 mg/dL (ref 6–23)
BUN: 9 mg/dL (ref 6–23)
CALCIUM: 7.1 mg/dL — AB (ref 8.4–10.5)
CO2: 27 mmol/L (ref 19–32)
CO2: 24 mmol/L (ref 19–32)
CO2: 28 mmol/L (ref 19–32)
CREATININE: 1.41 mg/dL — AB (ref 0.50–1.10)
Calcium: 7.3 mg/dL — ABNORMAL LOW (ref 8.4–10.5)
Calcium: 8.1 mg/dL — ABNORMAL LOW (ref 8.4–10.5)
Chloride: 101 mmol/L (ref 96–112)
Chloride: 102 mmol/L (ref 96–112)
Chloride: 104 mmol/L (ref 96–112)
Creatinine, Ser: 2.08 mg/dL — ABNORMAL HIGH (ref 0.50–1.10)
Creatinine, Ser: 1.98 mg/dL — ABNORMAL HIGH (ref 0.50–1.10)
GFR calc Af Amer: 32 mL/min — ABNORMAL LOW (ref >90)
GFR calc Af Amer: 49 mL/min — ABNORMAL LOW (ref >90)
GFR calc non Af Amer: 26 mL/min — ABNORMAL LOW (ref >90)
GFR calc non Af Amer: 28 mL/min — ABNORMAL LOW (ref >90)
GFR calc non Af Amer: 42 mL/min — ABNORMAL LOW (ref >90)
GFR, EST AFRICAN AMERICAN: 30 mL/min — AB (ref >90)
GLUCOSE: 141 mg/dL — AB (ref 70–99)
GLUCOSE: 91 mg/dL (ref 70–99)
Glucose, Bld: 129 mg/dL — ABNORMAL HIGH (ref 70–99)
Phosphorus: 1.6 mg/dL — ABNORMAL LOW (ref 2.3–4.6)
Phosphorus: 1.6 mg/dL — ABNORMAL LOW (ref 2.3–4.6)
Phosphorus: 1.7 mg/dL — ABNORMAL LOW (ref 2.3–4.6)
Potassium: 3.1 mmol/L — ABNORMAL LOW (ref 3.5–5.1)
Potassium: 3.1 mmol/L — ABNORMAL LOW (ref 3.5–5.1)
Potassium: 3.5 mmol/L (ref 3.5–5.1)
SODIUM: 135 mmol/L (ref 135–145)
Sodium: 136 mmol/L (ref 135–145)
Sodium: 139 mmol/L (ref 135–145)

## 2015-01-24 LAB — MAGNESIUM
Magnesium: 1.8 mg/dL (ref 1.5–2.5)
Magnesium: 2 mg/dL (ref 1.5–2.5)

## 2015-01-24 LAB — GLUCOSE, CAPILLARY
GLUCOSE-CAPILLARY: 75 mg/dL (ref 70–99)
GLUCOSE-CAPILLARY: 123 mg/dL — AB (ref 70–99)
Glucose-Capillary: 69 mg/dL — ABNORMAL LOW (ref 70–99)
Glucose-Capillary: 91 mg/dL (ref 70–99)
Glucose-Capillary: 119 mg/dL — ABNORMAL HIGH (ref 70–99)
Glucose-Capillary: 169 mg/dL — ABNORMAL HIGH (ref 70–99)
Glucose-Capillary: 91 mg/dL (ref 70–99)

## 2015-01-24 LAB — CBC
HCT: 18.6 % — ABNORMAL LOW (ref 36.0–46.0)
HEMATOCRIT: 27.3 % — AB (ref 36.0–46.0)
HEMOGLOBIN: 9.2 g/dL — AB (ref 12.0–15.0)
Hemoglobin: 6.1 g/dL — CL (ref 12.0–15.0)
MCH: 26.9 pg (ref 26.0–34.0)
MCH: 28 pg (ref 26.0–34.0)
MCHC: 32.8 g/dL (ref 30.0–36.0)
MCHC: 33.7 g/dL (ref 30.0–36.0)
MCV: 81.9 fL (ref 78.0–100.0)
MCV: 83 fL (ref 78.0–100.0)
PLATELETS: 190 10*3/uL (ref 150–400)
Platelets: 183 10*3/uL (ref 150–400)
RBC: 2.27 MIL/uL — ABNORMAL LOW (ref 3.87–5.11)
RBC: 3.29 MIL/uL — AB (ref 3.87–5.11)
RDW: 15.6 % — ABNORMAL HIGH (ref 11.5–15.5)
RDW: 14.6 % (ref 11.5–15.5)
WBC: 7 10*3/uL (ref 4.0–10.5)
WBC: 7.4 10*3/uL (ref 4.0–10.5)

## 2015-01-24 LAB — PARATHYROID HORMONE, INTACT (NO CA): PTH: 1171 pg/mL — ABNORMAL HIGH (ref 15–65)

## 2015-01-24 LAB — TRIGLYCERIDES: TRIGLYCERIDES: 258 mg/dL — AB (ref <150)

## 2015-01-24 LAB — PHOSPHORUS: PHOSPHORUS: 1.8 mg/dL — AB (ref 2.3–4.6)

## 2015-01-24 LAB — PREPARE RBC (CROSSMATCH)

## 2015-01-24 MED ORDER — METOCLOPRAMIDE HCL 5 MG/ML IJ SOLN
5.0000 mg | Freq: Three times a day (TID) | INTRAMUSCULAR | Status: AC
  Administered 2015-01-25 – 2015-01-26 (×6): 5 mg via INTRAVENOUS
  Filled 2015-01-24 (×6): qty 1

## 2015-01-24 MED ORDER — VITAL HIGH PROTEIN PO LIQD
1000.0000 mL | ORAL | Status: DC
  Filled 2015-01-24 (×2): qty 1000

## 2015-01-24 MED ORDER — DEXTROSE 50 % IV SOLN
INTRAVENOUS | Status: AC
  Administered 2015-01-24: 25 mL
  Filled 2015-01-24: qty 50

## 2015-01-24 MED ORDER — DARBEPOETIN ALFA 150 MCG/0.3ML IJ SOSY
150.0000 ug | PREFILLED_SYRINGE | INTRAMUSCULAR | Status: DC
  Filled 2015-01-24 (×2): qty 0.3

## 2015-01-24 MED ORDER — DEXTROSE-NACL 5-0.9 % IV SOLN
INTRAVENOUS | Status: DC
  Administered 2015-01-24 – 2015-02-03 (×4): via INTRAVENOUS

## 2015-01-24 MED ORDER — SODIUM CHLORIDE 0.9 % IV SOLN: Freq: Once | INTRAVENOUS | Status: DC

## 2015-01-24 MED ORDER — POTASSIUM CHLORIDE 10 MEQ/50ML IV SOLN
10.0000 meq | INTRAVENOUS | Status: AC
  Administered 2015-01-24 (×3): 10 meq via INTRAVENOUS
  Filled 2015-01-24 (×3): qty 50

## 2015-01-24 MED ORDER — PRO-STAT SUGAR FREE PO LIQD
60.0000 mL | Freq: Two times a day (BID) | ORAL | Status: DC
  Administered 2015-01-24 – 2015-01-29 (×10): 60 mL
  Filled 2015-01-24 (×12): qty 60

## 2015-01-24 MED ORDER — VITAL HIGH PROTEIN PO LIQD
1000.0000 mL | ORAL | Status: DC
  Administered 2015-01-24 – 2015-01-27 (×4): 1000 mL
  Filled 2015-01-24 (×4): qty 1000

## 2015-01-24 MED ORDER — DARBEPOETIN ALFA 150 MCG/0.3ML IJ SOSY
150.0000 ug | PREFILLED_SYRINGE | INTRAMUSCULAR | Status: DC
  Administered 2015-01-26: 150 ug via SUBCUTANEOUS
  Filled 2015-01-24: qty 0.3

## 2015-01-24 NOTE — Progress Notes (Signed)
Hypoglycemic Event  CBG: 69  Treatment: D50 IV 25 mL  Symptoms: None  Follow-up CBG: Time:0355 CBG Result: 116  Possible Reasons for Event: npo  Comments/MD notified elink d5ns@50  ordered    Wendi Snipes  Remember to initiate Hypoglycemia Order Set & complete

## 2015-01-24 NOTE — Progress Notes (Signed)
[facility] ROUNDING NOTE   Subjective:   Interval History: Patient is stable being transfused still sedated   BP control better   Remains on CRRT  Objective:  Vital signs in last 24 hours:  Temp:  [95.1 F (35.1 C)-98.3 F (36.8 C)] 97.7 F (36.5 C) (03/23 0655) Pulse Rate:  [31-119] 78 (03/23 0640) Resp:  [17-23] 20 (03/23 0655) BP: (105-164)/(60-92) 119/60 mmHg (03/23 0655) SpO2:  [98 %-100 %] 100 % (03/23 0600) FiO2 (%):  [40 %] 40 % (03/23 0600)  Weight change:  Filed Weights   01/19/15 2127 01/20/15 2100 01/23/15 0500  Weight: 56.564 kg (124 lb 11.2 oz) 60.192 kg (132 lb 11.2 oz) 60.5 kg (133 lb 6.1 oz)    Intake/Output: I/O last 3 completed shifts: In: 5531.4 [I.V.:4431.4; Blood:335; NG/GT:265; IV Piggyback:500] Out: 1694 [Urine:175; HWTUU:8280]   Intake/Output this shift:     CVS- RRR RS- CTA  Intubated  ABD- BS present soft non-distended EXT- no edema   Basic Metabolic Panel:  Recent Labs Lab 01/20/15 0427 01/21/15 0305 01/22/15 1728 01/23/15 0520 01/23/15 1520 01/24/15 0400 01/24/15 0500  NA 135 134* 139 138 138  --  136  K 3.9 3.9 4.3 3.6 3.4*  --  3.1*  CL 105 104 108 103 103  --  102  CO2 21 14* 18* 21 22  --  24  GLUCOSE 90 86 64* 116* 84  --  129*  BUN 39* 32* 41* 28* 18  --  10  CREATININE 3.29* 2.73* 4.50* 3.36* 2.58*  --  1.98*  CALCIUM 6.2* 6.8* 6.4* 6.3* 7.0*  --  7.3*  MG  --   --   --  1.2*  --  1.8  --   PHOS 4.2  --   --  4.1 2.6  --  1.6*    Liver Function Tests:  Recent Labs Lab 01/19/15 0500 01/20/15 0427 01/21/15 1630 01/23/15 0520 01/23/15 1520 01/24/15 0500  AST 14  --  17  --   --   --   ALT 8  --  7  --   --   --   ALKPHOS 58  --  63  --   --   --   BILITOT 0.7  --  0.6  --   --   --   PROT 6.1  --  5.9*  --   --   --   ALBUMIN 2.2* 2.3* 2.3* 2.0* 2.0* 1.8*    Recent Labs Lab 01/21/15 1630  LIPASE 18  AMYLASE 98   No results for input(s): AMMONIA in the last 168  hours.  CBC:  Recent Labs Lab 01/19/15 0500 01/20/15 0427 01/21/15 0305 01/22/15 0245 01/23/15 0520 01/24/15 0400  WBC 8.3  --  12.3* 26.3* 12.0* 7.0  HGB 8.6* 7.4* 8.6* 8.8* 7.4* 6.1*  HCT 26.6* 23.4* 26.1* 27.9* 22.5* 18.6*  MCV 82.6  --  82.1 85.3 81.8 81.9  PLT 215  --  265 251 219 190    Cardiac Enzymes:  Recent Labs Lab 01/18/15 2255 01/19/15 0500 01/19/15 0956 01/21/15 1630 01/21/15 2308  TROPONINI 0.57* 0.44* 0.38* 0.65* 0.74*    BNP: Invalid input(s): POCBNP  CBG:  Recent Labs Lab 01/23/15 1625 01/23/15 1628 01/23/15 2046 01/23/15 2329 01/24/15 0336  GLUCAP 19* 73 73 64* 19*    Microbiology: Results for orders placed or performed during the hospital encounter of 01/18/15  MRSA PCR Screening     Status: None   Collection  Time: 01/21/15 10:04 AM  Result Value Ref Range Status   MRSA by PCR NEGATIVE NEGATIVE Final    Comment:        The GeneXpert MRSA Assay (FDA approved for NASAL specimens only), is one component of a comprehensive MRSA colonization surveillance program. It is not intended to diagnose MRSA infection nor to guide or monitor treatment for MRSA infections.   Culture, blood (routine x 2)     Status: None (Preliminary result)   Collection Time: 01/21/15 11:30 AM  Result Value Ref Range Status   Specimen Description BLOOD LEFT ASSIST CONTROL  Final   Special Requests BOTTLES DRAWN AEROBIC ONLY 8CC  Final   Culture   Final           BLOOD CULTURE RECEIVED NO GROWTH TO DATE CULTURE WILL BE HELD FOR 5 DAYS BEFORE ISSUING A FINAL NEGATIVE REPORT Performed at [facility]    Report Status PENDING  Incomplete  Culture, Urine     Status: None   Collection Time: 01/21/15  4:24 PM  Result Value Ref Range Status   Specimen Description URINE, CATHETERIZED  Final   Special Requests Immunocompromised  Final   Colony Count NO GROWTH Performed at [facility]   Final   Culture NO GROWTH Performed at [facility]   Final   Report Status 01/22/2015 FINAL  Final    Coagulation Studies: No results for input(s): LABPROT, INR in the last 72 hours.  Urinalysis:  Recent Labs  01/21/15 1624  COLORURINE YELLOW  LABSPEC 1.019  PHURINE 5.5  GLUCOSEU NEGATIVE  HGBUR SMALL*  BILIRUBINUR NEGATIVE  KETONESUR 15*  PROTEINUR >300*  UROBILINOGEN 0.2  NITRITE NEGATIVE  LEUKOCYTESUR NEGATIVE      Imaging: Dg Chest Port 1 View  01/23/2015   CLINICAL DATA:  Acute respiratory failure  EXAM: PORTABLE CHEST - 1 VIEW  COMPARISON:  01/22/2015  FINDINGS: ET tube is 5 cm above the carina. Left jugular central line extends into the SVC. Right jugular central line extends into the SVC. Mild left base airspace opacity persists. There has been continued clearance of central airspace opacities, now off nearly completely resolved.  IMPRESSION: Support equipment appears satisfactorily positioned.  Continued improvement, with mild residual left base airspace opacity.   Electronically Signed   By: Andreas Newport M.D.   On: 01/23/2015 04:50   Dg Chest Port 1 View  01/22/2015   CLINICAL DATA:  Central line placement  EXAM: PORTABLE CHEST - 1 VIEW  COMPARISON:  01/22/2015 at 15:15  FINDINGS: There is a new right jugular central line with tip in the SVC the azygos vein junction. The left jugular central line extends into the SVC. Endotracheal tube tip is 4 cm above the carina. Nasogastric tube extends into the stomach. Mild airspace opacities persist in the central and basilar regions with slight continuing improvement.  IMPRESSION: Support equipment appears satisfactorily positioned.  New right jugular central line extends to the SVC at the azygos vein junction  No pneumothorax  Continued improvement in the airspace opacity   Electronically Signed   By: Andreas Newport M.D.   On: 01/22/2015 22:49   Dg Chest Port 1 View  01/22/2015   CLINICAL DATA:  Acute respiratory failure.  EXAM: PORTABLE CHEST - 1 VIEW   COMPARISON:  01/21/2015  FINDINGS: Support devices are in stable position. Improving bilateral airspace opacities, likely improving edema. Heart size is borderline. No effusions. No acute bony abnormality.  IMPRESSION: Improving bilateral airspace  opacities.   Electronically Signed   By: Rolm Baptise M.D.   On: 01/22/2015 16:03     Medications:   . dextrose 5 % and 0.9% NaCl 50 mL/hr at 01/24/15 0400  . fentaNYL infusion INTRAVENOUS 250 mcg/hr (01/24/15 0200)  . heparin 1,150 Units/hr (01/23/15 1934)  . dialysis replacement fluid (prismasate) 300 mL/hr at 01/23/15 1739  . dialysis replacement fluid (prismasate) 200 mL/hr at 01/24/15 0206  . dialysate (PRISMASATE) 1,000 mL/hr at 01/24/15 0214  . propofol 30 mcg/kg/min (01/24/15 0600)   . sodium chloride   Intravenous Once  . antiseptic oral rinse  7 mL Mouth Rinse QID  . chlorhexidine  15 mL Mouth Rinse BID  . famotidine (PEPCID) IV  20 mg Intravenous Q24H  . ferrous sulfate  325 mg Oral BID WC  . folic acid  1 mg Oral Daily  . hydrocortisone-pramoxine  1 applicator Rectal BID  . multivitamin  1 tablet Oral QHS  . piperacillin-tazobactam (ZOSYN)  IV  2.25 g Intravenous 4 times per day  . sodium chloride  3 mL Intravenous Q12H  . thiamine  100 mg Oral Daily  . vancomycin  500 mg Intravenous Q24H  . Vitamin D (Ergocalciferol)  50,000 Units Oral Q Wed   acetaminophen, fentaNYL, heparin, heparin, hydrALAZINE, menthol-cetylpyridinium, midazolam, midazolam, ondansetron **OR** ondansetron (ZOFRAN) IV  Assessment/ Plan:  SLE on chronic cell cept and plaquenil, known CKD progressive CKD. Last seen by Dr. [name] on 12/26/14 with a creatinine of 3.5 and she was being teed up for eventual dialysis (TOPS Education, [facility]VS referral).  1. AKI on CKD4 - CRRT a little less hemodynamically stressful than intermittent HD 2. Metabolic acidosis - resolved and IV dextrose began due to hypoglycemia. 3. Resp failure - infection vs PE. Per CCM.  Immunosuppressive meds on hold 4. Fever/leukocytosis - empiric vancomycin and zosyn 5. Sustained tachycardia - SVT vs sinus tachy. Trops mildly up. EF 40-45%.  6. Diarrhea - resolved  7. SLE - Patient now off immunosuppressants 8. Anemia - Aranesp. 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

## 2015-01-24 NOTE — Progress Notes (Signed)
[facility] Progress Note Patient Name: [name] [name] DOB: [birth year] MRN: [id number]   Date of Service  01/24/2015  HPI/Events of Note  High residuals On m ax fent gtt  eICU Interventions  reglan 5 q8 x 6 doses minimise fent gtt     Intervention Category Intermediate Interventions: Other:  Siddiq Kaluzny V. 01/24/2015, 11:44 PM

## 2015-01-24 NOTE — Progress Notes (Addendum)
PULMONARY / CRITICAL CARE MEDICINE   Name: [name] [name] MRN: [id number] DOB: Mar 03, 1961    ADMISSION DATE:  01/18/2015 CONSULTATION DATE:  3/20  REFERRING MD :  Eliseo Squires   CHIEF COMPLAINT:  Tachycardia, resp failure   INITIAL PRESENTATION:  54 yo female with hx lupus (on cellcept, plaquenil) and associated CKD, DM, HTN initially presented 3/17 with vague c/o SOB, diarrhea, malaise, weakness, cough.  She was admitted by [facility] and r/o for flu.  Was undergoing further w/u for diarrhea, mildly elevated troponin and anemia.  Overnight 3/19 developed intermittent SVT, then persistent tachycardia with HR 150's and worsening SOB and hypoxia requiring NRB.  PCCM consulted 3/20.  She required ETT/MV 3/20. CXR with B infiltrates. Initiated CVVHD on 3/21 pm.   STUDIES:  CXR 3/20>>>neg  2D echo 3/19>>> OH60-73%, grade 1 diastolic dysfunction, mod AR, mod MR, mod TR. No pericardial effusion.  LE doppler US 3/20 >> prelim result = negative >>   SIGNIFICANT EVENTS: Adenosine challenge 3/21 >> appears to be sinus rhythm with some non-conducted p-waves  SUBJECTIVE:  Has tolerated CVVHD, I/O even for last 24 h Tachycardia much improved Fentanyl was added to propofol 3/22  VITAL SIGNS: Temp:  [96.6 F (35.9 C)-98.3 F (36.8 C)] 97.1 F (36.2 C) (03/23 0930) Pulse Rate:  [31-119] 74 (03/23 0832) Resp:  [17-21] 20 (03/23 0930) BP: (105-164)/(57-90) 150/74 mmHg (03/23 0930) SpO2:  [98 %-100 %] 100 % (03/23 0832) FiO2 (%):  [40 %] 40 % (03/23 0832) Weight:  [61.2 kg (134 lb 14.7 oz)] 61.2 kg (134 lb 14.7 oz) (03/23 0500) HEMODYNAMICS: CVP:  [7 mmHg-10 mmHg] 10 mmHg VENTILATOR SETTINGS: Vent Mode:  [-] PRVC FiO2 (%):  [40 %] 40 % Set Rate:  [20 bmp] 20 bmp Vt Set:  [480 mL] 480 mL PEEP:  [10 cmH20] 10 cmH20 Plateau Pressure:  [23 cmH20-29 cmH20] 29 cmH20 INTAKE / OUTPUT:  Intake/Output Summary (Last 24 hours) at 01/24/15 1130 Last data filed at 01/24/15 1100  Gross per 24 hour  Intake 3608.05  ml  Output   3180 ml  Net 428.05 ml    PHYSICAL EXAMINATION: General:  Chronically ill appearing female, intubated and sedated Neuro:  Wakes to voice, moves spontaneously, did follow commands for RN HEENT:  Mm dry, ETT in place Cardiovascular:  s1s2 tachy but improved to 110's Lungs:  Clear B  Abdomen:  round, mildly distended, soft, mildly tender diffusely  Musculoskeletal:  Warm and dry, gray appearing, no edema  LABS:  CBC  Recent Labs Lab 01/22/15 0245 01/23/15 0520 01/24/15 0400  WBC 26.3* 12.0* 7.0  HGB 8.8* 7.4* 6.1*  HCT 27.9* 22.5* 18.6*  PLT 251 219 190   Coag's No results for input(s): APTT, INR in the last 168 hours. BMET  Recent Labs Lab 01/23/15 1520 01/24/15 0400 01/24/15 0500  NA 138 135 136  K 3.4* 3.1* 3.1*  CL 103 101 102  CO2 22 27 24   BUN 18 14 10   CREATININE 2.58* 2.08* 1.98*  GLUCOSE 84 141* 129*   Electrolytes  Recent Labs Lab 01/23/15 0520 01/23/15 1520 01/24/15 0400 01/24/15 0500  CALCIUM 6.3* 7.0* 7.1* 7.3*  MG 1.2*  --  1.8  --   PHOS 4.1 2.6 1.6* 1.6*   Sepsis Markers  Recent Labs Lab 01/18/15 1858 01/21/15 1623 01/21/15 1646 01/22/15 0245 01/23/15 0520  LATICACIDVEN 1.48  --  0.9  --   --   PROCALCITON  --  1.12  --  3.21 1.81  ABG  Recent Labs Lab 01/21/15 0955 01/21/15 1753 01/23/15 0312  PHART 7.329* 7.269* 7.327*  PCO2ART 27.8* 30.6* 32.9*  PO2ART 77.0* 154.0* 165.0*   Liver Enzymes  Recent Labs Lab 01/19/15 0500  01/21/15 1630  01/23/15 1520 01/24/15 0400 01/24/15 0500  AST 14  --  17  --   --   --   --   ALT 8  --  7  --   --   --   --   ALKPHOS 58  --  63  --   --   --   --   BILITOT 0.7  --  0.6  --   --   --   --   ALBUMIN 2.2*  < > 2.3*  < > 2.0* 1.8* 1.8*  < > = values in this interval not displayed. Cardiac Enzymes  Recent Labs Lab 01/19/15 0956 01/21/15 1630 01/21/15 2308  TROPONINI 0.38* 0.65* 0.74*   Glucose  Recent Labs Lab 01/23/15 1625 01/23/15 1628  01/23/15 2046 01/23/15 2329 01/24/15 0336 01/24/15 0758  GLUCAP 19* 73 73 64* 69* 91    Imaging Dg Chest Port 1 View  01/23/2015   CLINICAL DATA:  Acute respiratory failure  EXAM: PORTABLE CHEST - 1 VIEW  COMPARISON:  01/22/2015  FINDINGS: ET tube is 5 cm above the carina. Left jugular central line extends into the SVC. Right jugular central line extends into the SVC. Mild left base airspace opacity persists. There has been continued clearance of central airspace opacities, now off nearly completely resolved.  IMPRESSION: Support equipment appears satisfactorily positioned.  Continued improvement, with mild residual left base airspace opacity.   Electronically Signed   By: Andreas Newport M.D.   On: 01/23/2015 04:50    ASSESSMENT / PLAN:  PULMONARY Acute hypoxic respiratory failure - unknown etiology but in setting B infiltrates. Have considered and treated for HCAP, PE. Consider acute pulmonary edema in setting HTN and renal failure. Also consider lupus pneumonitis although infiltrates have almost completely cleared. Most likely cause is cardiogenic edema.  P:   Vent support  At this point PE is lower on the DDx. I believe heparin can be stopped Have considered the possibility of steroids for lupus pneumonitis but her CXR has quickly cleared (with no therapy) making this very unlikely. Will defer F/u CXR  HR and BP control  Volume removal with HD  CARDIOVASCULAR SVT/sinus tachycardia - HR was 150 at time of her acute decompensation, Adenosine given, appeared to be sinus with some non-conducted p-waves. Presumed reactive to critical illness Mildly elevated troponin peak 0.74 dCHF - new.  EF 40-45% with MOD aortic, tricuspid and mitral regurg.  ??PE BLE venous dopplers prelim negative; doubt P:  Stop empiric heparin gtt Hydralazine prn  RENAL Acute on chronic renal failure, presume lupus nephritis.  Hyponatremia  metabolic acidosis P:   Appreciate renal service's  assistance Continue CVVHD for now, transition to intermittent soon. She is not expected to recover her renal fxn Will work on some volume removal beginning 2/23  GASTROINTESTINAL Diarrhea - resolved abd pain - neg abd xray  P:   CDiff, stool cultures pending, no longer having stools; doubt infxn at this point Advance TF's pepcid   HEMATOLOGIC Leukocytosis  Anemia  P:  Cont iron  Received 1u PRBC overnight 3/22 F/u CBC  INFECTIOUS Fever  Unclear etiology of infection, consider PNA in immunocompromised host but CXR has quickly cleared.  ??GI given diarrhea v viral v other source (ie lupus  pneumonitis).  Immunocompromised on rx for lupus.  P:   BCx2 3/20>>> UC 3/20>>> negative  Vanc 3/20 (empiric) >>> 3/22 Zosyn 3/20 (empiric) >>>  Pan culture pending C diff cancelled as no further diarrhea Empiric broad spectrum abx > plan d/c vanco on 3/22 Hold cellcept, plaquenil temporarily - will plan to restart when stabilized  ENDOCRINE No active issue    P:   Monitor glucose on chem   NEUROLOGIC Sedation needs  P:   RASS goal: -1 PRN fent/ versed while on vent    FAMILY  - Updates:  Mother updated at beside 3/20, sister on 3/21 - 3/23  - Inter-disciplinary family meet or Palliative Care meeting due by: 3/26  Independent CC time 20 minutes  Baltazar Apo, MD, PhD 01/24/2015, 11:30 AM [facility] [phone] or if no answer [phone]

## 2015-01-24 NOTE — Progress Notes (Signed)
eLink Physician-Brief Progress Note Patient Name: [name] [name] DOB: 07-May-1961 MRN: [id number]   Date of Service  01/24/2015  HPI/Events of Note  Hgb down from 7.4 to 6.1 in patient with CVA and NSTEMI  eICU Interventions  Plan: Transfuse 2 units pRBC Post-transfusion CBC     Intervention Category Intermediate Interventions: Bleeding - evaluation and treatment with blood products  DETERDING,ELIZABETH 01/24/2015, 5:07 AM

## 2015-01-24 NOTE — Progress Notes (Signed)
eLink Physician-Brief Progress Note Patient Name: ROMANITA FAGER DOB: 02/13/1961 MRN: [id number]   Date of Service  01/24/2015  HPI/Events of Note  Hypoglycemia  eICU Interventions  D/C NS infusion Start D5NS at 50 cc/hr Continue to monitor blood sugar     Intervention Category Intermediate Interventions: Other:  DETERDING,ELIZABETH 01/24/2015, 3:42 AM

## 2015-01-24 NOTE — Progress Notes (Signed)
NUTRITION FOLLOW UP  Intervention:   Initiate TF via OGT with Vital High Protein at 25 ml/h and Prostat 60 ml BID on day 1; on day 2, increase to goal rate of 35 ml/h (840 ml per day) to provide 1240 kcals, 134 gm protein, 702 ml free water daily.  Total TF regimen (with current propofol rate) will provide 1430 kcals (100% of estimated needs), 134 grams protein, and 702 ml fluid daily  Nutrition Dx:   Inadequate oral intake related to inability to eat as evidenced by NPO status; ongoing  Goal:   Intake to meet >90% of estimated nutrition needs.  Monitor:   TF initiation/tolerance/adequacy, weight trend, labs, vent status.  Assessment:   Patient admitted with weakness and recent weight loss. History of lupus, CKD, DM. Patient with frequent diarrhea within 45 minutes after eating since starting Cellcept in November. She has lost 20 lbs in the past 3-4 months.  RD received consult for TF initiation and management.  Patient remains intubated on ventilator support. OGT in place.  MV: 10.1 L/min Temp (24hrs), Avg:97.4 F (36.3 C), Min:96.6 F (35.9 C), Max:98.3 F (36.8 C)  Propofol: 7.2 ml/hr (190 kcals).   Pt with declining renal function; nephrology following. HD cath placed and pt started CRRT on 01/22/15.   Labs reviewed. K: 3.1, Creat: 1.98, Calcium: 7.5, Phos: 1.6, Glucose: 129, CBGS: 64-91.   Height: Ht Readings from Last 1 Encounters:  12/07/14 5\' 5"  (1.651 m)    Weight Status:   Wt Readings from Last 1 Encounters:  01/24/15 134 lb 14.7 oz (61.2 kg)   01/20/15 132 lb 11.2 oz (60.192 kg)   01/19/15 124 lb 11.2 oz (56.564 kg)           Re-estimated needs:  Kcal: 1427.2 Protein: 130-140 grams Fluid: >1.5 L  Skin: stage II pressure ulcer on buttocks  Diet Order: Diet NPO time specified   Intake/Output Summary (Last 24 hours) at 01/24/15 1229 Last data filed at 01/24/15 1100  Gross per 24 hour  Intake 3454.25 ml  Output   2970 ml  Net 484.25 ml     Last BM: 01/20/15   Labs:   Recent Labs Lab 01/23/15 0520 01/23/15 1520 01/24/15 0400 01/24/15 0500  NA 138 138 135 136  K 3.6 3.4* 3.1* 3.1*  CL 103 103 101 102  CO2 21 22 27 24   BUN 28* 18 14 10   CREATININE 3.36* 2.58* 2.08* 1.98*  CALCIUM 6.3* 7.0* 7.1* 7.3*  MG 1.2*  --  1.8  --   PHOS 4.1 2.6 1.6* 1.6*  GLUCOSE 116* 84 141* 129*    CBG (last 3)   Recent Labs  01/23/15 2329 01/24/15 0336 01/24/15 0758  GLUCAP 64* 69* 91    Scheduled Meds: . sodium chloride   Intravenous Once  . antiseptic oral rinse  7 mL Mouth Rinse QID  . chlorhexidine  15 mL Mouth Rinse BID  . darbepoetin (ARANESP) injection - NON-DIALYSIS  150 mcg Subcutaneous Q Wed-1800  . famotidine (PEPCID) IV  20 mg Intravenous Q24H  . ferrous sulfate  325 mg Oral BID WC  . folic acid  1 mg Oral Daily  . hydrocortisone-pramoxine  1 applicator Rectal BID  . multivitamin  1 tablet Oral QHS  . piperacillin-tazobactam (ZOSYN)  IV  2.25 g Intravenous 4 times per day  . sodium chloride  3 mL Intravenous Q12H  . thiamine  100 mg Oral Daily  . vancomycin  500 mg Intravenous Q24H  .  Vitamin D (Ergocalciferol)  50,000 Units Oral Q Wed    Continuous Infusions: . dextrose 5 % and 0.9% NaCl 50 mL/hr at 01/24/15 0400  . fentaNYL infusion INTRAVENOUS 250 mcg/hr (01/24/15 0200)  . dialysis replacement fluid (prismasate) 300 mL/hr at 01/23/15 1739  . dialysis replacement fluid (prismasate) 200 mL/hr at 01/24/15 0206  . dialysate (PRISMASATE) 1,000 mL/hr at 01/24/15 0735  . propofol 30 mcg/kg/min (01/24/15 0600)    Ladavion Savitz A. Jimmye Norman, RD, LDN, CDE Pager: [phone] After hours Pager: [phone]

## 2015-01-25 ENCOUNTER — Inpatient Hospital Stay (HOSPITAL_COMMUNITY): Payer: Managed Care, Other (non HMO)

## 2015-01-25 LAB — GLUCOSE, CAPILLARY
GLUCOSE-CAPILLARY: 103 mg/dL — AB (ref 70–99)
GLUCOSE-CAPILLARY: 119 mg/dL — AB (ref 70–99)
GLUCOSE-CAPILLARY: 124 mg/dL — AB (ref 70–99)
GLUCOSE-CAPILLARY: 19 mg/dL — AB (ref 70–99)
Glucose-Capillary: 131 mg/dL — ABNORMAL HIGH (ref 70–99)
Glucose-Capillary: 106 mg/dL — ABNORMAL HIGH (ref 70–99)
Glucose-Capillary: 112 mg/dL — ABNORMAL HIGH (ref 70–99)

## 2015-01-25 LAB — TYPE AND SCREEN
ABO/RH(D): O POS
Antibody Screen: NEGATIVE
Unit division: 0
Unit division: 0

## 2015-01-25 LAB — CBC
HEMATOCRIT: 27.5 % — AB (ref 36.0–46.0)
Hemoglobin: 9.2 g/dL — ABNORMAL LOW (ref 12.0–15.0)
MCH: 27.7 pg (ref 26.0–34.0)
MCHC: 33.5 g/dL (ref 30.0–36.0)
MCV: 82.8 fL (ref 78.0–100.0)
PLATELETS: 190 10*3/uL (ref 150–400)
RBC: 3.32 MIL/uL — AB (ref 3.87–5.11)
RDW: 15.4 % (ref 11.5–15.5)
WBC: 6.8 10*3/uL (ref 4.0–10.5)

## 2015-01-25 LAB — MAGNESIUM
Magnesium: 2 mg/dL (ref 1.5–2.5)
Magnesium: 2 mg/dL (ref 1.5–2.5)
Magnesium: 2.1 mg/dL (ref 1.5–2.5)

## 2015-01-25 LAB — RENAL FUNCTION PANEL
ALBUMIN: 2 g/dL — AB (ref 3.5–5.2)
ANION GAP: 9 (ref 5–15)
Albumin: 2.3 g/dL — ABNORMAL LOW (ref 3.5–5.2)
Anion gap: 8 (ref 5–15)
BUN: 13 mg/dL (ref 6–23)
BUN: 14 mg/dL (ref 6–23)
CALCIUM: 8.7 mg/dL (ref 8.4–10.5)
CHLORIDE: 105 mmol/L (ref 96–112)
CO2: 24 mmol/L (ref 19–32)
CO2: 27 mmol/L (ref 19–32)
CREATININE: 1.37 mg/dL — AB (ref 0.50–1.10)
CREATININE: 1.46 mg/dL — AB (ref 0.50–1.10)
Calcium: 8 mg/dL — ABNORMAL LOW (ref 8.4–10.5)
Chloride: 103 mmol/L (ref 96–112)
GFR calc Af Amer: 47 mL/min — ABNORMAL LOW (ref >90)
GFR calc non Af Amer: 43 mL/min — ABNORMAL LOW (ref >90)
GFR calc non Af Amer: 40 mL/min — ABNORMAL LOW (ref >90)
GFR, EST AFRICAN AMERICAN: 50 mL/min — AB (ref >90)
GLUCOSE: 111 mg/dL — AB (ref 70–99)
GLUCOSE: 126 mg/dL — AB (ref 70–99)
PHOSPHORUS: 1.9 mg/dL — AB (ref 2.3–4.6)
POTASSIUM: 3.6 mmol/L (ref 3.5–5.1)
Phosphorus: 3.8 mg/dL (ref 2.3–4.6)
Potassium: 3.9 mmol/L (ref 3.5–5.1)
SODIUM: 138 mmol/L (ref 135–145)
Sodium: 138 mmol/L (ref 135–145)

## 2015-01-25 LAB — PHOSPHORUS
PHOSPHORUS: 3.7 mg/dL (ref 2.3–4.6)
Phosphorus: 1.9 mg/dL — ABNORMAL LOW (ref 2.3–4.6)

## 2015-01-25 MED ORDER — POTASSIUM PHOSPHATES 15 MMOLE/5ML IV SOLN
20.0000 mmol | Freq: Once | INTRAVENOUS | Status: AC
  Administered 2015-01-25: 20 mmol via INTRAVENOUS
  Filled 2015-01-25: qty 6.67

## 2015-01-25 NOTE — Progress Notes (Signed)
Notified Dr. [name] that pt's filter clotted. Per MD will not restart CRRT and reassess need in the morning.

## 2015-01-25 NOTE — Progress Notes (Signed)
PULMONARY / CRITICAL CARE MEDICINE   Name: [name] [name] MRN: [id number] DOB: 1961-05-30    ADMISSION DATE:  01/18/2015 CONSULTATION DATE:  3/20  REFERRING MD :  Eliseo Squires   CHIEF COMPLAINT:  Tachycardia, resp failure   INITIAL PRESENTATION:  54 y/o female with hx lupus (on cellcept, plaquenil) and associated CKD, DM, HTN initially presented 3/17 with vague c/o SOB, diarrhea, malaise, weakness, cough.  She was admitted by [facility] and r/o for flu.  Was undergoing further w/u for diarrhea, mildly elevated troponin and anemia.  Overnight 3/19 developed intermittent SVT, then persistent tachycardia with HR 150's and worsening SOB and hypoxia requiring NRB.  PCCM consulted 3/20.  She required ETT/MV 3/20. CXR with B infiltrates. Initiated CVVHD on 3/21 pm.   STUDIES:  CXR 3/20>>>neg  2D echo 3/19>>> TK16-01%, grade 1 diastolic dysfunction, mod AR, mod MR, mod TR. No pericardial effusion.  LE doppler US 3/20 >> prelim result = negative >>   SIGNIFICANT EVENTS: 3/21  Adenosine challenge >> appears to be sinus rhythm with some non-conducted p-waves 3/23  Tolerated CVVHD, I/O even last 24 h, tachycardia improved, fent + propofol  SUBJECTIVE: high residuals overnight, on max dose fentanyl.  Rx'd with Reglan per ELink.  Afebrile.  Tachypnea with WUA  VITAL SIGNS: Temp:  [96.6 F (35.9 C)-98.8 F (37.1 C)] 97 F (36.1 C) (03/24 1400) Pulse Rate:  [25-132] 93 (03/24 1300) Resp:  [15-55] 15 (03/24 1400) BP: (113-201)/(61-101) 138/72 mmHg (03/24 1400) SpO2:  [90 %-100 %] 100 % (03/24 1400) FiO2 (%):  [40 %] 40 % (03/24 1400) Weight:  [131 lb 6.3 oz (59.6 kg)] 131 lb 6.3 oz (59.6 kg) (03/24 0410)   HEMODYNAMICS: CVP:  [8 mmHg-10 mmHg] 8 mmHg   VENTILATOR SETTINGS: Vent Mode:  [-] PRVC FiO2 (%):  [40 %] 40 % Set Rate:  [20 bmp] 20 bmp Vt Set:  [480 mL] 480 mL PEEP:  [5 cmH20-10 cmH20] 5 cmH20 Pressure Support:  [16 cmH20] 16 cmH20 Plateau Pressure:  [23 cmH20-30 cmH20] 23 cmH20 INTAKE /  OUTPUT:  Intake/Output Summary (Last 24 hours) at 01/25/15 1420 Last data filed at 01/25/15 1400  Gross per 24 hour  Intake 3755.49 ml  Output   6041 ml  Net -2285.51 ml    PHYSICAL EXAMINATION: General:  Chronically ill appearing female, intubated and sedated Neuro:  Sedate, localizes, intermittent follow commands  HEENT:  MM dry, ETT in place Cardiovascular:  s1s2 tachy  Lungs:  Clear B  Abdomen:  round, mildly distended, soft, mildly tender diffusely  Musculoskeletal:  Warm and dry, no edema  LABS:  CBC  Recent Labs Lab 01/24/15 0400 01/24/15 1139 01/25/15 0400  WBC 7.0 7.4 6.8  HGB 6.1* 9.2* 9.2*  HCT 18.6* 27.3* 27.5*  PLT 190 183 190   Coag's No results for input(s): APTT, INR in the last 168 hours.   BMET  Recent Labs Lab 01/24/15 0500 01/24/15 1530 01/25/15 0400  NA 136 139 138  K 3.1* 3.5 3.6  CL 102 104 105  CO2 24 28 24   BUN 10 9 13   CREATININE 1.98* 1.41* 1.37*  GLUCOSE 129* 91 111*   Electrolytes  Recent Labs Lab 01/24/15 0500 01/24/15 1530 01/25/15 0107 01/25/15 0400  CALCIUM 7.3* 8.1*  --  8.0*  MG  --  2.0 2.0 2.0  PHOS 1.6* 1.8*  1.7* 1.9* 1.9*   Sepsis Markers  Recent Labs Lab 01/18/15 1858 01/21/15 1623 01/21/15 1646 01/22/15 0245 01/23/15 0520  LATICACIDVEN 1.48  --  0.9  --   --   PROCALCITON  --  1.12  --  3.21 1.81   ABG  Recent Labs Lab 01/21/15 0955 01/21/15 1753 01/23/15 0312  PHART 7.329* 7.269* 7.327*  PCO2ART 27.8* 30.6* 32.9*  PO2ART 77.0* 154.0* 165.0*   Liver Enzymes  Recent Labs Lab 01/19/15 0500  01/21/15 1630  01/24/15 0500 01/24/15 1530 01/25/15 0400  AST 14  --  17  --   --   --   --   ALT 8  --  7  --   --   --   --   ALKPHOS 58  --  63  --   --   --   --   BILITOT 0.7  --  0.6  --   --   --   --   ALBUMIN 2.2*  < > 2.3*  < > 1.8* 2.1* 2.0*  < > = values in this interval not displayed.   Cardiac Enzymes  Recent Labs Lab 01/19/15 0956 01/21/15 1630 01/21/15 2308   TROPONINI 0.38* 0.65* 0.74*   Glucose  Recent Labs Lab 01/24/15 1540 01/24/15 1957 01/24/15 2330 01/25/15 0358 01/25/15 0815 01/25/15 1136  GLUCAP 91 123* 124* 103* 131* 106*    Imaging No results found.  ASSESSMENT / PLAN:  PULMONARY Acute hypoxic respiratory failure - unknown etiology but in setting B infiltrates. Have considered and treated for HCAP, PE (d/c'd heparin 3/23). Consider acute pulmonary edema in setting HTN and renal failure. Also consider lupus pneumonitis although infiltrates have almost completely cleared. Most likely cause is cardiogenic edema.  P:   Vent support  Have considered the possibility of lupus pneumonitis but her CXR has quickly cleared (with no therapy) making this very unlikely, will defer rx F/u CXR  HR and BP control  Volume removal with HD Daily WUA / SBT  CARDIOVASCULAR SVT/sinus tachycardia - HR was 150 at time of her acute decompensation, Adenosine given, appeared to be sinus with some non-conducted p-waves. Presumed reactive to critical illness Mildly elevated troponin peak 0.74 dCHF - new.  EF 40-45% with MOD aortic, tricuspid and mitral regurg.  ??PE - BLE venous dopplers prelim negative; doubt PE (heparin gtt stopped 3/23) P:  Hydralazine prn Monitor hemodynamics / EKG  RENAL Acute on chronic renal failure, presume lupus nephritis.  Hyponatremia  Metabolic acidosis P:   Appreciate renal service's assistance Continue CVVHD for now, transition to intermittent soon. She is not expected to recover her renal fxn Will work on some volume removal beginning 3/23  GASTROINTESTINAL Diarrhea - resolved Abd pain - neg abd xray  High Gastric Residuals P:   CDiff, stool cultures pending, no longer having stools; doubt infxn at this point Advance TF's Reglan x 6 doses, started 3/23 pm with high residuals Pepcid   HEMATOLOGIC Leukocytosis  Anemia - s/p 1 unit PRBC 3/22 P:  Cont iron  F/u CBC  INFECTIOUS Fever   Immunocompromised Host Diarrhea - resolved Unclear etiology of infection, consider PNA in immunocompromised host but CXR has quickly cleared.  ??GI given diarrhea v viral v other source (ie lupus pneumonitis).  Immunocompromised on rx for lupus.  P:   BCx2 3/20 >>> UC 3/20 >>> negative  Vanc 3/20 (empiric) >>> 3/22 Zosyn 3/20 (empiric) >>>  Pan culture pending C diff cancelled as no further diarrhea Empiric broad spectrum abx  Hold cellcept, plaquenil temporarily - will plan to restart when stabilized  ENDOCRINE No active issue    P:   Monitor  glucose on chem   NEUROLOGIC Sedation needs  P:   RASS goal: 0 to -1 Fentanyl / Propofol gtt, allow for lighter sedation, goal to minimize and hopeful extubation soon   FAMILY  - Updates:  Updated at bedside per Dr. Lamonte Sakai   - Inter-disciplinary family meet or Palliative Care meeting due by: 3/26   Noe Gens, NP-C [facility] Pgr: [phone] or [phone] 01/25/2015, 2:20 PM   Attending Note:  I have examined patient, reviewed labs, studies and notes. I have discussed the case with B Ollis, and I agree with the data and plans as amended above. She SLE nephritis, resp failure with improving infiltrates. She remains on CVVHD. Our goals for today are to allow wake up and push for SBT. I am concerned that agitation will be an issue / barrier.  Independent critical care time is 40 minutes.   Baltazar Apo, MD, PhD 01/25/2015, 2:52 PM [facility] [phone] or if no answer [phone]

## 2015-01-25 NOTE — Progress Notes (Signed)
Pt's residuals >600 times 2 consecutive hours. Tube feeding re-fed and rate decreased to 15 cc/hr. At 1600 pt's residuals were 720. Tube feeds on hold per protocol. Will continue to monitor.

## 2015-01-25 NOTE — Progress Notes (Signed)
[name] ROUNDING NOTE   Subjective:   Interval History: still receiving CRRT and having no issues tolerating gentle fluid removal    Objective:  Vital signs in last 24 hours:  Temp:  [96.6 F (35.9 C)-98.8 F (37.1 C)] 96.9 F (36.1 C) (03/24 0900) Pulse Rate:  [25-125] 86 (03/24 0859) Resp:  [15-45] 20 (03/24 0900) BP: (113-201)/(64-101) 154/67 mmHg (03/24 0900) SpO2:  [90 %-100 %] 100 % (03/24 0859) FiO2 (%):  [40 %] 40 % (03/24 0900) Weight:  [59.6 kg (131 lb 6.3 oz)] 59.6 kg (131 lb 6.3 oz) (03/24 0410)  Weight change: -1.6 kg (-3 lb 8.4 oz) Filed Weights   01/23/15 0500 01/24/15 0500 01/25/15 0410  Weight: 60.5 kg (133 lb 6.1 oz) 61.2 kg (134 lb 14.7 oz) 59.6 kg (131 lb 6.3 oz)    Intake/Output: I/O last 3 completed shifts: In: 5300.9 [I.V.:3375.9; Blood:670; NG/GT:755; IV [id number]:193] Out: 7902 [Urine:140; IOXBD:5329]   Intake/Output this shift:  Total I/O In: 852.5 [I.V.:240.8; NG/GT:105; IV Piggyback:506.7] Out: 489 [Urine:15; Other:474]  CVS- RRR RS- CTA Intubated  ABD- BS present soft non-distended EXT- no edema  Basic Metabolic Panel:  Recent Labs Lab 01/23/15 0520 01/23/15 1520 01/24/15 0400 01/24/15 0500 01/24/15 1530 01/25/15 0107 01/25/15 0400  NA 138 138 135 136 139  --  138  K 3.6 3.4* 3.1* 3.1* 3.5  --  3.6  CL 103 103 101 102 104  --  105  CO2 21 22 27 24 28   --  24  GLUCOSE 116* 84 141* 129* 91  --  111*  BUN 28* 18 14 10 9   --  13  CREATININE 3.36* 2.58* 2.08* 1.98* 1.41*  --  1.37*  CALCIUM 6.3* 7.0* 7.1* 7.3* 8.1*  --  8.0*  MG 1.2*  --  1.8  --  2.0 2.0 2.0  PHOS 4.1 2.6 1.6* 1.6* 1.8*  1.7* 1.9* 1.9*    Liver Function Tests:  Recent Labs Lab 01/19/15 0500  01/21/15 1630  01/23/15 1520 01/24/15 0400 01/24/15 0500 01/24/15 1530 01/25/15 0400  AST 14  --  17  --   --   --   --   --   --   ALT 8  --  7  --   --   --   --   --   --   ALKPHOS 58  --  63  --   --   --   --   --   --   BILITOT 0.7   --  0.6  --   --   --   --   --   --   PROT 6.1  --  5.9*  --   --   --   --   --   --   ALBUMIN 2.2*  < > 2.3*  < > 2.0* 1.8* 1.8* 2.1* 2.0*  < > = values in this interval not displayed.  Recent Labs Lab 01/21/15 1630  LIPASE 18  AMYLASE 98   No results for input(s): AMMONIA in the last 168 hours.  CBC:  Recent Labs Lab 01/22/15 0245 01/23/15 0520 01/24/15 0400 01/24/15 1139 01/25/15 0400  WBC 26.3* 12.0* 7.0 7.4 6.8  HGB 8.8* 7.4* 6.1* 9.2* 9.2*  HCT 27.9* 22.5* 18.6* 27.3* 27.5*  MCV 85.3 81.8 81.9 83.0 82.8  PLT 251 219 190 183 190    Cardiac Enzymes:  Recent Labs Lab 01/18/15 2255 01/19/15 0500 01/19/15 0956 01/21/15 1630 01/21/15 2308  TROPONINI 0.57* 0.44* 0.38* 0.65* 0.74*    BNP: Invalid input(s): POCBNP  CBG:  Recent Labs Lab 01/24/15 1101 01/24/15 1540 01/24/15 1957 01/24/15 2330 01/25/15 0358  GLUCAP 75 91 123* 124* 103*    Microbiology: Results for orders placed or performed during the hospital encounter of 01/18/15  MRSA PCR Screening     Status: None   Collection Time: 01/21/15 10:04 AM  Result Value Ref Range Status   MRSA by PCR NEGATIVE NEGATIVE Final    Comment:        The GeneXpert MRSA Assay (FDA approved for NASAL specimens only), is one component of a comprehensive MRSA colonization surveillance program. It is not intended to diagnose MRSA infection nor to guide or monitor treatment for MRSA infections.   Culture, blood (routine x 2)     Status: None (Preliminary result)   Collection Time: 01/21/15 11:30 AM  Result Value Ref Range Status   Specimen Description BLOOD LEFT ASSIST CONTROL  Final   Special Requests BOTTLES DRAWN AEROBIC ONLY 8CC  Final   Culture   Final           BLOOD CULTURE RECEIVED NO GROWTH TO DATE CULTURE WILL BE HELD FOR 5 DAYS BEFORE ISSUING A FINAL NEGATIVE REPORT Performed at [facility]    Report Status PENDING  Incomplete  Culture, Urine     Status: None   Collection Time:  01/21/15  4:24 PM  Result Value Ref Range Status   Specimen Description URINE, CATHETERIZED  Final   Special Requests Immunocompromised  Final   Colony Count NO GROWTH Performed at [facility]   Final   Culture NO GROWTH Performed at [facility]   Final   Report Status 01/22/2015 FINAL  Final    Coagulation Studies: No results for input(s): LABPROT, INR in the last 72 hours.  Urinalysis: No results for input(s): COLORURINE, LABSPEC, PHURINE, GLUCOSEU, HGBUR, BILIRUBINUR, KETONESUR, PROTEINUR, UROBILINOGEN, NITRITE, LEUKOCYTESUR in the last 72 hours.  Invalid input(s): APPERANCEUR    Imaging: Dg Chest Port 1 View  01/25/2015   CLINICAL DATA:  Acute respiratory failure  EXAM: PORTABLE CHEST - 1 VIEW  COMPARISON:  01/23/2015  FINDINGS: Endotracheal tube tip at the clavicular heads. Bilateral IJ catheters in stable position, tips at the upper SVC. The orogastric tube reaches the stomach at least.  Normal heart size and aortic contours. There is improved basilar aeration with probable trace effusions or atelectasis. No edema or air leak. No evidence for pneumonia.  IMPRESSION: 1. Tubes and lines remain in good position. 2. Continued clearing of basilar opacities.   Electronically Signed   By: Monte Fantasia M.D.   On: 01/25/2015 07:04     Medications:   . dextrose 5 % and 0.9% NaCl 50 mL/hr at 01/25/15 0346  . fentaNYL infusion INTRAVENOUS 250 mcg/hr (01/25/15 0951)  . dialysis replacement fluid (prismasate) 300 mL/hr at 01/25/15 0228  . dialysis replacement fluid (prismasate) 200 mL/hr at 01/25/15 0408  . dialysate (PRISMASATE) 1,000 mL/hr at 01/25/15 0957  . propofol 15 mcg/kg/min (01/25/15 0951)   . antiseptic oral rinse  7 mL Mouth Rinse QID  . chlorhexidine  15 mL Mouth Rinse BID  . [START ON 01/26/2015] darbepoetin (ARANESP) injection - NON-DIALYSIS  150 mcg Subcutaneous Q Fri-1800  . famotidine (PEPCID) IV  20 mg Intravenous Q24H  . feeding supplement  (PRO-STAT SUGAR FREE 64)  60 mL Per Tube BID  . feeding supplement (VITAL HIGH PROTEIN)  1,000 mL  Per Tube Q24H  . folic acid  1 mg Oral Daily  . hydrocortisone-pramoxine  1 applicator Rectal BID  . metoCLOPramide (REGLAN) injection  5 mg Intravenous 3 times per day  . multivitamin  1 tablet Oral QHS  . piperacillin-tazobactam (ZOSYN)  IV  2.25 g Intravenous 4 times per day  . potassium phosphate IVPB (mmol)  20 mmol Intravenous Once  . sodium chloride  3 mL Intravenous Q12H  . thiamine  100 mg Oral Daily  . Vitamin D (Ergocalciferol)  50,000 Units Oral Q Wed   acetaminophen, fentaNYL, heparin, heparin, hydrALAZINE, midazolam, ondansetron **OR** ondansetron (ZOFRAN) IV  Assessment/ Plan:  1. 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 Metabolic acidosis - resolved and IV dextrose began due to hypoglycemia. 3. Resp failure - infection vs PE. Per CCM. Immunosuppressive meds on hold 4. Fever/leukocytosis - empiric vancomycin and zosyn 5. Sustained tachycardia - SVT vs sinus tachy. Trops mildly up. EF 40-45%.  6. Diarrhea - resolved  7. SLE - Patient now off immunosuppressants 8. Anemia - Aranesp. 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

## 2015-01-25 NOTE — Progress Notes (Signed)
Utilization review completed.  

## 2015-01-26 ENCOUNTER — Inpatient Hospital Stay (HOSPITAL_COMMUNITY): Payer: Managed Care, Other (non HMO)

## 2015-01-26 LAB — RENAL FUNCTION PANEL
ALBUMIN: 2.1 g/dL — AB (ref 3.5–5.2)
ANION GAP: 10 (ref 5–15)
Albumin: 2.2 g/dL — ABNORMAL LOW (ref 3.5–5.2)
Anion gap: 10 (ref 5–15)
BUN: 21 mg/dL (ref 6–23)
BUN: 21 mg/dL (ref 6–23)
CALCIUM: 9 mg/dL (ref 8.4–10.5)
CO2: 23 mmol/L (ref 19–32)
CO2: 23 mmol/L (ref 19–32)
CREATININE: 2.28 mg/dL — AB (ref 0.50–1.10)
Calcium: 8.7 mg/dL (ref 8.4–10.5)
Chloride: 102 mmol/L (ref 96–112)
Chloride: 105 mmol/L (ref 96–112)
Creatinine, Ser: 2.42 mg/dL — ABNORMAL HIGH (ref 0.50–1.10)
GFR calc Af Amer: 27 mL/min — ABNORMAL LOW (ref >90)
GFR calc non Af Amer: 22 mL/min — ABNORMAL LOW (ref >90)
GFR calc non Af Amer: 23 mL/min — ABNORMAL LOW (ref >90)
GFR, EST AFRICAN AMERICAN: 25 mL/min — AB (ref >90)
Glucose, Bld: 100 mg/dL — ABNORMAL HIGH (ref 70–99)
Glucose, Bld: 121 mg/dL — ABNORMAL HIGH (ref 70–99)
PHOSPHORUS: 3.2 mg/dL (ref 2.3–4.6)
POTASSIUM: 3.9 mmol/L (ref 3.5–5.1)
Phosphorus: 3 mg/dL (ref 2.3–4.6)
Potassium: 3.7 mmol/L (ref 3.5–5.1)
Sodium: 135 mmol/L (ref 135–145)
Sodium: 138 mmol/L (ref 135–145)

## 2015-01-26 LAB — POCT ACTIVATED CLOTTING TIME
ACTIVATED CLOTTING TIME: 165 s
ACTIVATED CLOTTING TIME: 177 s
ACTIVATED CLOTTING TIME: 190 s
ACTIVATED CLOTTING TIME: 196 s
Activated Clotting Time: 116 seconds
Activated Clotting Time: 122 s
Activated Clotting Time: 171 s
Activated Clotting Time: 165 s
Activated Clotting Time: 171 seconds
Activated Clotting Time: 171 s
Activated Clotting Time: 177 seconds
Activated Clotting Time: 183 seconds
Activated Clotting Time: 202 seconds

## 2015-01-26 LAB — CBC
HCT: 27.8 % — ABNORMAL LOW (ref 36.0–46.0)
HEMOGLOBIN: 9.1 g/dL — AB (ref 12.0–15.0)
MCH: 27.6 pg (ref 26.0–34.0)
MCHC: 32.7 g/dL (ref 30.0–36.0)
MCV: 84.2 fL (ref 78.0–100.0)
PLATELETS: 215 10*3/uL (ref 150–400)
RBC: 3.3 MIL/uL — AB (ref 3.87–5.11)
RDW: 15.8 % — AB (ref 11.5–15.5)
WBC: 7.8 10*3/uL (ref 4.0–10.5)

## 2015-01-26 LAB — GLUCOSE, CAPILLARY
GLUCOSE-CAPILLARY: 106 mg/dL — AB (ref 70–99)
Glucose-Capillary: 100 mg/dL — ABNORMAL HIGH (ref 70–99)
Glucose-Capillary: 108 mg/dL — ABNORMAL HIGH (ref 70–99)
Glucose-Capillary: 135 mg/dL — ABNORMAL HIGH (ref 70–99)
Glucose-Capillary: 92 mg/dL (ref 70–99)
Glucose-Capillary: 99 mg/dL (ref 70–99)

## 2015-01-26 LAB — MAGNESIUM
MAGNESIUM: 1.9 mg/dL (ref 1.5–2.5)
Magnesium: 2.1 mg/dL (ref 1.5–2.5)

## 2015-01-26 LAB — PHOSPHORUS: Phosphorus: 3.1 mg/dL (ref 2.3–4.6)

## 2015-01-26 LAB — PROCALCITONIN: Procalcitonin: 1.09 ng/mL

## 2015-01-26 MED ORDER — HYDROCORTISONE ACE-PRAMOXINE 1-1 % RE FOAM: 1.0000 | Freq: Two times a day (BID) | RECTAL | Status: DC | PRN

## 2015-01-26 MED ORDER — HEPARIN BOLUS VIA INFUSION (CRRT)
1000.0000 [IU] | INTRAVENOUS | Status: DC | PRN
  Administered 2015-01-26 (×2): 1000 [IU] via INTRAVENOUS_CENTRAL
  Filled 2015-01-26 (×3): qty 1000

## 2015-01-26 MED ORDER — HEPARIN SODIUM (PORCINE) 1000 UNIT/ML DIALYSIS
1000.0000 [IU] | INTRAMUSCULAR | Status: DC | PRN
  Filled 2015-01-26: qty 6

## 2015-01-26 MED ORDER — DEXMEDETOMIDINE HCL IN NACL 400 MCG/100ML IV SOLN
0.4000 ug/kg/h | INTRAVENOUS | Status: DC
  Administered 2015-01-26: 0.4 ug/kg/h via INTRAVENOUS
  Administered 2015-01-26 – 2015-01-27 (×2): 0.7 ug/kg/h via INTRAVENOUS
  Filled 2015-01-26 (×4): qty 100

## 2015-01-26 MED ORDER — SODIUM CHLORIDE 0.9 % IJ SOLN
250.0000 [IU]/h | INTRAMUSCULAR | Status: DC
  Administered 2015-01-26: 250 [IU]/h via INTRAVENOUS_CENTRAL
  Administered 2015-01-26 – 2015-01-28 (×4): 1300 [IU]/h via INTRAVENOUS_CENTRAL
  Administered 2015-01-28 (×2): 1400 [IU]/h via INTRAVENOUS_CENTRAL
  Administered 2015-01-29: 1350 [IU]/h via INTRAVENOUS_CENTRAL
  Administered 2015-01-29: 1400 [IU]/h via INTRAVENOUS_CENTRAL
  Administered 2015-01-30 (×2): 1250 [IU]/h via INTRAVENOUS_CENTRAL
  Filled 2015-01-26 (×12): qty 2

## 2015-01-26 NOTE — Progress Notes (Signed)
ANTIBIOTIC CONSULT NOTE - FOLLOW UP  Pharmacy Consult for Zosyn Indication: empiric for fevers  Allergies  Allergen Reactions  . Sulfa Antibiotics Itching    Patient Measurements: Weight: 127 lb 10.3 oz (57.9 kg)  Vital Signs: Temp: 98.6 F (37 C) (03/25 0630) Temp Source: Core (Comment) (03/25 0800) BP: 164/83 mmHg (03/25 0823) Pulse Rate: 122 (03/25 0823) Intake/Output from previous day: 03/24 0701 - 03/25 0700 In: 3011.6 [I.V.:1845; NG/GT:410; IV Piggyback:756.7] Out: 2864 [Urine:55] Intake/Output from this shift: Total I/O In: 207.2 [I.V.:107.2; NG/GT:100] Out: -   Labs:  Recent Labs  01/24/15 1139  01/25/15 0400 01/25/15 1527 01/26/15 0355  WBC 7.4  --  6.8  --  7.8  HGB 9.2*  --  9.2*  --  9.1*  PLT 183  --  190  --  215  CREATININE  --   < > 1.37* 1.46* 2.42*  < > = values in this interval not displayed. Estimated Creatinine Clearance: 24.5 mL/min (by C-G formula based on Cr of 2.42). No results for input(s): VANCOTROUGH, VANCOPEAK, VANCORANDOM, GENTTROUGH, GENTPEAK, GENTRANDOM, TOBRATROUGH, TOBRAPEAK, TOBRARND, AMIKACINPEAK, AMIKACINTROU, AMIKACIN in the last 72 hours.   Microbiology: Recent Results (from the past 720 hour(s))  MRSA PCR Screening     Status: None   Collection Time: 01/21/15 10:04 AM  Result Value Ref Range Status   MRSA by PCR NEGATIVE NEGATIVE Final    Comment:        The GeneXpert MRSA Assay (FDA approved for NASAL specimens only), is one component of a comprehensive MRSA colonization surveillance program. It is not intended to diagnose MRSA infection nor to guide or monitor treatment for MRSA infections.   Culture, blood (routine x 2)     Status: None (Preliminary result)   Collection Time: 01/21/15 11:30 AM  Result Value Ref Range Status   Specimen Description BLOOD LEFT ASSIST CONTROL  Final   Special Requests BOTTLES DRAWN AEROBIC ONLY 8CC  Final   Culture   Final           BLOOD CULTURE RECEIVED NO GROWTH TO DATE  CULTURE WILL BE HELD FOR 5 DAYS BEFORE ISSUING A FINAL NEGATIVE REPORT Performed at [facility]    Report Status PENDING  Incomplete  Culture, Urine     Status: None   Collection Time: 01/21/15  4:24 PM  Result Value Ref Range Status   Specimen Description URINE, CATHETERIZED  Final   Special Requests Immunocompromised  Final   Colony Count NO GROWTH Performed at [facility]   Final   Culture NO GROWTH Performed at [facility]   Final   Report Status 01/22/2015 FINAL  Final    Anti-infectives    Start     Dose/Rate Route Frequency Ordered Stop   01/23/15 0000  piperacillin-tazobactam (ZOSYN) IVPB 2.25 g     2.25 g 100 mL/hr over 30 Minutes Intravenous 4 times per day 01/22/15 2141     01/21/15 1200  piperacillin-tazobactam (ZOSYN) IVPB 3.375 g  Status:  Discontinued     3.375 g 12.5 mL/hr over 240 Minutes Intravenous 3 times per day 01/21/15 1122 01/22/15 2141   01/21/15 1145  vancomycin (VANCOCIN) 500 mg in sodium chloride 0.9 % 100 mL IVPB  Status:  Discontinued     500 mg 100 mL/hr over 60 Minutes Intravenous Every 24 hours 01/21/15 1122 01/24/15 1424   01/21/15 1000  azithromycin (ZITHROMAX) tablet 250 mg  Status:  Discontinued     250 mg Oral  Daily 01/20/15 0237 01/21/15 0951   01/20/15 0245  azithromycin (ZITHROMAX) tablet 500 mg     500 mg Oral Daily 01/20/15 0237 01/20/15 0322   01/19/15 1000  hydroxychloroquine (PLAQUENIL) tablet 400 mg  Status:  Discontinued     400 mg Oral Daily 01/18/15 2208 01/21/15 1230      Assessment: 54 year old female with respiratory failure.  She is on day #7 of antibiotics, day #6 of Zosyn empirically.  Procalcitonin ordered by CCM to help determine antibiotic duration.  She remains on CRRT.  Plan:  Zosyn 2.25gm IV q6h - 30 minute infusion while on CRRT Follow for renal replacement plans and adjust regimen accordingly Follow-up procalcitonin  Legrand Como, Pharm.D., BCPS, AAHIVP Clinical  Pharmacist Phone: [phone] or [phone] 01/26/2015, 10:40 AM

## 2015-01-26 NOTE — Progress Notes (Signed)
[facility] ROUNDING NOTE   Subjective:   Interval History: intubated and alert but not able to wean  Objective:  Vital signs in last 24 hours:  Temp:  [96.7 F (35.9 C)-99.7 F (37.6 C)] 98.6 F (37 C) (03/25 0630) Pulse Rate:  [66-134] 122 (03/25 0823) Resp:  [15-55] 20 (03/25 0823) BP: (105-185)/(61-96) 164/83 mmHg (03/25 0823) SpO2:  [100 %] 100 % (03/25 0823) FiO2 (%):  [40 %] 40 % (03/25 0823) Weight:  [57.9 kg (127 lb 10.3 oz)] 57.9 kg (127 lb 10.3 oz) (03/25 0348)  Weight change: -1.7 kg (-3 lb 12 oz) Filed Weights   01/24/15 0500 01/25/15 0410 01/26/15 0348  Weight: 61.2 kg (134 lb 14.7 oz) 59.6 kg (131 lb 6.3 oz) 57.9 kg (127 lb 10.3 oz)    Intake/Output: I/O last 3 completed shifts: In: 4621.7 [I.V.:2945; NG/GT:820; IV Piggyback:856.7] Out: 5564 [Urine:80; Other:5484]   Intake/Output this shift:  Total I/O In: 207.2 [I.V.:107.2; NG/GT:100] Out: -   CVS- RRR RS- CTA  ETT ABD- BS present soft non-distended EXT- no edema   Basic Metabolic Panel:  Recent Labs Lab 01/24/15 0500 01/24/15 1530 01/25/15 0107 01/25/15 0400 01/25/15 1527 01/25/15 1644 01/26/15 0355  NA 136 139  --  138 138  --  135  K 3.1* 3.5  --  3.6 3.9  --  3.9  CL 102 104  --  105 103  --  102  CO2 24 28  --  24 27  --  23  GLUCOSE 129* 91  --  111* 126*  --  100*  BUN 10 9  --  13 14  --  21  CREATININE 1.98* 1.41*  --  1.37* 1.46*  --  2.42*  CALCIUM 7.3* 8.1*  --  8.0* 8.7  --  8.7  MG  --  2.0 2.0 2.0  --  2.1 1.9  PHOS 1.6* 1.8*  1.7* 1.9* 1.9* 3.8 3.7 3.1  3.2    Liver Function Tests:  Recent Labs Lab 01/21/15 1630  01/24/15 0500 01/24/15 1530 01/25/15 0400 01/25/15 1527 01/26/15 0355  AST 17  --   --   --   --   --   --   ALT 7  --   --   --   --   --   --   ALKPHOS 63  --   --   --   --   --   --   BILITOT 0.6  --   --   --   --   --   --   PROT 5.9*  --   --   --   --   --   --   ALBUMIN 2.3*  < > 1.8* 2.1* 2.0* 2.3* 2.1*  < > = values in  this interval not displayed.  Recent Labs Lab 01/21/15 1630  LIPASE 18  AMYLASE 98   No results for input(s): AMMONIA in the last 168 hours.  CBC:  Recent Labs Lab 01/23/15 0520 01/24/15 0400 01/24/15 1139 01/25/15 0400 01/26/15 0355  WBC 12.0* 7.0 7.4 6.8 7.8  HGB 7.4* 6.1* 9.2* 9.2* 9.1*  HCT 22.5* 18.6* 27.3* 27.5* 27.8*  MCV 81.8 81.9 83.0 82.8 84.2  PLT 219 190 183 190 215    Cardiac Enzymes:  Recent Labs Lab 01/19/15 0956 01/21/15 1630 01/21/15 2308  TROPONINI 0.38* 0.65* 0.74*    BNP: Invalid input(s): POCBNP  CBG:  Recent Labs Lab 01/25/15 1136 01/25/15 1536  01/25/15 1933 01/26/15 0008 01/26/15 0409  GLUCAP 106* 119* 112* 99 26    Microbiology: Results for orders placed or performed during the hospital encounter of 01/18/15  MRSA PCR Screening     Status: None   Collection Time: 01/21/15 10:04 AM  Result Value Ref Range Status   MRSA by PCR NEGATIVE NEGATIVE Final    Comment:        The GeneXpert MRSA Assay (FDA approved for NASAL specimens only), is one component of a comprehensive MRSA colonization surveillance program. It is not intended to diagnose MRSA infection nor to guide or monitor treatment for MRSA infections.   Culture, blood (routine x 2)     Status: None (Preliminary result)   Collection Time: 01/21/15 11:30 AM  Result Value Ref Range Status   Specimen Description BLOOD LEFT ASSIST CONTROL  Final   Special Requests BOTTLES DRAWN AEROBIC ONLY 8CC  Final   Culture   Final           BLOOD CULTURE RECEIVED NO GROWTH TO DATE CULTURE WILL BE HELD FOR 5 DAYS BEFORE ISSUING A FINAL NEGATIVE REPORT Performed at [facility]    Report Status PENDING  Incomplete  Culture, Urine     Status: None   Collection Time: 01/21/15  4:24 PM  Result Value Ref Range Status   Specimen Description URINE, CATHETERIZED  Final   Special Requests Immunocompromised  Final   Colony Count NO GROWTH Performed at [facility]    Final   Culture NO GROWTH Performed at [facility]   Final   Report Status 01/22/2015 FINAL  Final    Coagulation Studies: No results for input(s): LABPROT, INR in the last 72 hours.  Urinalysis: No results for input(s): COLORURINE, LABSPEC, PHURINE, GLUCOSEU, HGBUR, BILIRUBINUR, KETONESUR, PROTEINUR, UROBILINOGEN, NITRITE, LEUKOCYTESUR in the last 72 hours.  Invalid input(s): APPERANCEUR    Imaging: Dg Chest Port 1 View  01/26/2015   CLINICAL DATA:  Acute respiratory failure  EXAM: PORTABLE CHEST - 1 VIEW  COMPARISON:  Portable chest x-ray of January 25, 2015  FINDINGS: The left hemidiaphragm is excluded from the field of view. The lungs are well-expanded and clear. The cardiac silhouette is top-normal in size. The pulmonary vascularity is normal. No significant pleural effusion is demonstrated. The endotracheal tube, esophagogastric tube, and right and left internal jugular venous catheters are unchanged.  IMPRESSION: There is no active cardiopulmonary disease. The visualized support tubes and lines are in appropriate position radiographically.   Electronically Signed   By: David  Martinique   On: 01/26/2015 07:40   Dg Chest Port 1 View  01/25/2015   CLINICAL DATA:  Acute respiratory failure  EXAM: PORTABLE CHEST - 1 VIEW  COMPARISON:  01/23/2015  FINDINGS: Endotracheal tube tip at the clavicular heads. Bilateral IJ catheters in stable position, tips at the upper SVC. The orogastric tube reaches the stomach at least.  Normal heart size and aortic contours. There is improved basilar aeration with probable trace effusions or atelectasis. No edema or air leak. No evidence for pneumonia.  IMPRESSION: 1. Tubes and lines remain in good position. 2. Continued clearing of basilar opacities.   Electronically Signed   By: Monte Fantasia M.D.   On: 01/25/2015 07:04     Medications:   . dextrose 5 % and 0.9% NaCl 50 mL/hr at 01/25/15 2344  . fentaNYL infusion INTRAVENOUS 250 mcg/hr (01/26/15  0156)  . heparin 10,000 units/ 20 mL infusion syringe    .  dialysis replacement fluid (prismasate) Stopped (01/25/15 1630)  . dialysis replacement fluid (prismasate) Stopped (01/25/15 1630)  . dialysate (PRISMASATE) Stopped (01/25/15 1630)  . propofol 20 mcg/kg/min (01/26/15 0907)   . antiseptic oral rinse  7 mL Mouth Rinse QID  . chlorhexidine  15 mL Mouth Rinse BID  . darbepoetin (ARANESP) injection - NON-DIALYSIS  150 mcg Subcutaneous Q Fri-1800  . famotidine (PEPCID) IV  20 mg Intravenous Q24H  . feeding supplement (PRO-STAT SUGAR FREE 64)  60 mL Per Tube BID  . feeding supplement (VITAL HIGH PROTEIN)  1,000 mL Per Tube Q24H  . folic acid  1 mg Oral Daily  . hydrocortisone-pramoxine  1 applicator Rectal BID  . metoCLOPramide (REGLAN) injection  5 mg Intravenous 3 times per day  . multivitamin  1 tablet Oral QHS  . piperacillin-tazobactam (ZOSYN)  IV  2.25 g Intravenous 4 times per day  . sodium chloride  3 mL Intravenous Q12H  . thiamine  100 mg Oral Daily  . Vitamin D (Ergocalciferol)  50,000 Units Oral Q Wed   acetaminophen, fentaNYL, heparin, heparin, heparin, heparin, hydrALAZINE, midazolam, ondansetron **OR** ondansetron (ZOFRAN) IV  Assessment/ Plan:  1. 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 Metabolic acidosis - resolved  3. Resp failure - No PE heparin stopped 4. Fever/leukocytosis - empiric  zosyn 5. Sustained tachycardia - SVT vs sinus tachy. Trops mildly up. EF 40-45%.  6. Diarrhea - resolved  7. SLE - Patient now off immunosuppressants 8. Anemia - Aranesp. 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

## 2015-01-26 NOTE — Progress Notes (Signed)
PULMONARY / CRITICAL CARE MEDICINE   Name: [name] [name] MRN: [id number] DOB: [birth year]    ADMISSION DATE:  01/18/2015 CONSULTATION DATE:  3/20  REFERRING MD :  Eliseo Squires   CHIEF COMPLAINT:  Tachycardia, resp failure   INITIAL PRESENTATION:  54 y/o female with hx lupus (on cellcept, plaquenil) and associated CKD, DM, HTN initially presented 3/17 with vague c/o SOB, diarrhea, malaise, weakness, cough.  She was admitted by [facility] and r/o for flu.  Was undergoing further w/u for diarrhea, mildly elevated troponin and anemia.  Overnight 3/19 developed intermittent SVT, then persistent tachycardia with HR 150's and worsening SOB and hypoxia requiring NRB.  PCCM consulted 3/20.  She required ETT/MV 3/20. CXR with B infiltrates. Initiated CVVHD on 3/21 pm.   STUDIES:  CXR 3/20>>>neg  2D echo 3/19>>> EX52-84%, grade 1 diastolic dysfunction, mod AR, mod MR, mod TR. No pericardial effusion.  LE doppler US 3/20 >> prelim result = negative >>   SIGNIFICANT EVENTS: 3/21  Adenosine challenge >> appears to be sinus rhythm with some non-conducted p-waves 3/23  Tolerated CVVHD, I/O even last 24 h, tachycardia improved, fent + propofol 3/24 high residuals overnight, on max dose fentanyl.  Rx'd with Reglan   SUBJECTIVE:  Afebrile.   Tachypnea & agitation with WUA  VITAL SIGNS: Temp:  [96.7 F (35.9 C)-99.7 F (37.6 C)] 98.6 F (37 C) (03/25 0630) Pulse Rate:  [66-134] 122 (03/25 0823) Resp:  [15-55] 20 (03/25 0823) BP: (105-185)/(61-96) 164/83 mmHg (03/25 0823) SpO2:  [100 %] 100 % (03/25 0823) FiO2 (%):  [40 %] 40 % (03/25 0823) Weight:  [57.9 kg (127 lb 10.3 oz)] 57.9 kg (127 lb 10.3 oz) (03/25 0348)   HEMODYNAMICS: CVP:  [9 mmHg] 9 mmHg   VENTILATOR SETTINGS: Vent Mode:  [-] PRVC FiO2 (%):  [40 %] 40 % Set Rate:  [20 bmp] 20 bmp Vt Set:  [480 mL] 480 mL PEEP:  [5 cmH20] 5 cmH20 Pressure Support:  [16 cmH20] 16 cmH20 Plateau Pressure:  [19 cmH20-23 cmH20] 20 cmH20 INTAKE /  OUTPUT:  Intake/Output Summary (Last 24 hours) at 01/26/15 1020 Last data filed at 01/26/15 0907  Gross per 24 hour  Intake 2366.35 ml  Output   2092 ml  Net 274.35 ml    PHYSICAL EXAMINATION: General:  Chronically ill appearing female, intubated and sedated Neuro:  Sedate, localizes, intermittent follow commands , int agitation, bite block HEENT:  MM dry, ETT in place Cardiovascular:  s1s2 tachy  Lungs:  Clear B  Abdomen:  round, mildly distended, soft, mildly tender diffusely  Musculoskeletal:  Warm and dry, no edema  LABS:  CBC  Recent Labs Lab 01/24/15 1139 01/25/15 0400 01/26/15 0355  WBC 7.4 6.8 7.8  HGB 9.2* 9.2* 9.1*  HCT 27.3* 27.5* 27.8*  PLT 183 190 215   Coag's No results for input(s): APTT, INR in the last 168 hours.   BMET  Recent Labs Lab 01/25/15 0400 01/25/15 1527 01/26/15 0355  NA 138 138 135  K 3.6 3.9 3.9  CL 105 103 102  CO2 24 27 23   BUN 13 14 21   CREATININE 1.37* 1.46* 2.42*  GLUCOSE 111* 126* 100*   Electrolytes  Recent Labs Lab 01/25/15 0400 01/25/15 1527 01/25/15 1644 01/26/15 0355  CALCIUM 8.0* 8.7  --  8.7  MG 2.0  --  2.1 1.9  PHOS 1.9* 3.8 3.7 3.1  3.2   Sepsis Markers  Recent Labs Lab 01/21/15 1623 01/21/15 1646 01/22/15 0245 01/23/15 0520  LATICACIDVEN  --  0.9  --   --   PROCALCITON 1.12  --  3.21 1.81   ABG  Recent Labs Lab 01/21/15 0955 01/21/15 1753 01/23/15 0312  PHART 7.329* 7.269* 7.327*  PCO2ART 27.8* 30.6* 32.9*  PO2ART 77.0* 154.0* 165.0*   Liver Enzymes  Recent Labs Lab 01/21/15 1630  01/25/15 0400 01/25/15 1527 01/26/15 0355  AST 17  --   --   --   --   ALT 7  --   --   --   --   ALKPHOS 63  --   --   --   --   BILITOT 0.6  --   --   --   --   ALBUMIN 2.3*  < > 2.0* 2.3* 2.1*  < > = values in this interval not displayed.   Cardiac Enzymes  Recent Labs Lab 01/21/15 1630 01/21/15 2308  TROPONINI 0.65* 0.74*   Glucose  Recent Labs Lab 01/25/15 0815 01/25/15 1136  01/25/15 1536 01/25/15 1933 01/26/15 0008 01/26/15 0409  GLUCAP 131* 106* 119* 112* 99 92    Imaging Dg Chest Port 1 View  01/25/2015   CLINICAL DATA:  Acute respiratory failure  EXAM: PORTABLE CHEST - 1 VIEW  COMPARISON:  01/23/2015  FINDINGS: Endotracheal tube tip at the clavicular heads. Bilateral IJ catheters in stable position, tips at the upper SVC. The orogastric tube reaches the stomach at least.  Normal heart size and aortic contours. There is improved basilar aeration with probable trace effusions or atelectasis. No edema or air leak. No evidence for pneumonia.  IMPRESSION: 1. Tubes and lines remain in good position. 2. Continued clearing of basilar opacities.   Electronically Signed   By: Monte Fantasia M.D.   On: 01/25/2015 07:04    ASSESSMENT / PLAN:  PULMONARY Acute hypoxic respiratory failure - unknown etiology but in setting B infiltrates. Have considered and treated for HCAP, PE (d/c'd heparin 3/23). Consider acute pulmonary edema in setting HTN and renal failure. Also consider lupus pneumonitis although infiltrates have almost completely cleared. Most likely cause is cardiogenic edema.  P:   Daily WUA / SBT with goal extubation once agitation/HR controlled  CARDIOVASCULAR SVT/sinus tachycardia - HR was 150 at time of her acute decompensation, Adenosine given, appeared to be sinus with some non-conducted p-waves. Presumed reactive to critical illness Mildly elevated troponin peak 0.74 dCHF - new.  EF 40-45% with MOD aortic, tricuspid and mitral regurg.  ??PE - BLE venous dopplers prelim negative; doubt PE (heparin gtt stopped 3/23) P:  Hydralazine prn  RENAL Acute on chronic renal failure, presume lupus nephritis.  Hyponatremia  Metabolic acidosis P:   Appreciate renal service's assistance Resumed  CVVHD - neg balance She is not expected to recover her renal fxn   GASTROINTESTINAL Diarrhea - resolved Abd pain - neg abd xray  High Gastric Residuals P:    Advance TF's Reglan x 6 doses, started 3/23 pm with high residuals Pepcid   HEMATOLOGIC Leukocytosis  Anemia - s/p 1 unit PRBC 3/22 P:  Cont iron  F/u CBC  INFECTIOUS Fever  Immunocompromised Host Diarrhea - resolved Unclear etiology of infection, consider PNA in immunocompromised host but CXR has quickly cleared.  ??GI given diarrhea v viral v other source (ie lupus pneumonitis).  Immunocompromised on rx for lupus.  P:   BCx2 3/20 >>>ng UC 3/20 >>> negative  Vanc 3/20 (empiric) >>> 3/22 Zosyn 3/20 (empiric) >>>  C diff cancelled as no further diarrhea Empiric broad  spectrum abx  -pct to decide duration Hold cellcept, plaquenil temporarily - will plan to restart when stabilized  ENDOCRINE No active issue    P:   Monitor glucose on chem   NEUROLOGIC Sedation needs  P:   RASS goal: 0 to -1 Fentanyl  gtt, allow for lighter sedation, goal to minimize , use precedex gtt & hopeful extubation soon   FAMILY  - Updates:  Updated at bedside per Dr. Lamonte Sakai   - Inter-disciplinary family meet or Palliative Care meeting due by: 3/26   Summary - Agitation main barrier to extubation  The patient is critically ill with multiple organ systems failure and requires high complexity decision making for assessment and support, frequent evaluation and titration of therapies, application of advanced monitoring technologies and extensive interpretation of multiple databases. Critical Care Time devoted to patient care services described in this note independent of APP time is 35 minutes.   Kara Mead MD. Shade Flood. [facility] Pager [phone] If no response call [phone]   01/26/2015, 10:20 AM

## 2015-01-27 ENCOUNTER — Inpatient Hospital Stay (HOSPITAL_COMMUNITY): Payer: Managed Care, Other (non HMO)

## 2015-01-27 LAB — RENAL FUNCTION PANEL
ALBUMIN: 2.2 g/dL — AB (ref 3.5–5.2)
ANION GAP: 13 (ref 5–15)
ANION GAP: 11 (ref 5–15)
Albumin: 2.4 g/dL — ABNORMAL LOW (ref 3.5–5.2)
BUN: 18 mg/dL (ref 6–23)
BUN: 14 mg/dL (ref 6–23)
CHLORIDE: 100 mmol/L (ref 96–112)
CO2: 25 mmol/L (ref 19–32)
CO2: 25 mmol/L (ref 19–32)
CREATININE: 1.71 mg/dL — AB (ref 0.50–1.10)
Calcium: 9.4 mg/dL (ref 8.4–10.5)
Calcium: 9.1 mg/dL (ref 8.4–10.5)
Chloride: 100 mmol/L (ref 96–112)
Creatinine, Ser: 1.76 mg/dL — ABNORMAL HIGH (ref 0.50–1.10)
GFR calc Af Amer: 39 mL/min — ABNORMAL LOW (ref >90)
GFR calc non Af Amer: 32 mL/min — ABNORMAL LOW (ref >90)
GFR calc non Af Amer: 33 mL/min — ABNORMAL LOW (ref >90)
GFR, EST AFRICAN AMERICAN: 37 mL/min — AB (ref >90)
GLUCOSE: 152 mg/dL — AB (ref 70–99)
GLUCOSE: 127 mg/dL — AB (ref 70–99)
PHOSPHORUS: 2.2 mg/dL — AB (ref 2.3–4.6)
PHOSPHORUS: 2.4 mg/dL (ref 2.3–4.6)
POTASSIUM: 3.6 mmol/L (ref 3.5–5.1)
Potassium: 3.7 mmol/L (ref 3.5–5.1)
Sodium: 138 mmol/L (ref 135–145)
Sodium: 136 mmol/L (ref 135–145)

## 2015-01-27 LAB — POCT ACTIVATED CLOTTING TIME
ACTIVATED CLOTTING TIME: 196 s
ACTIVATED CLOTTING TIME: 190 s
ACTIVATED CLOTTING TIME: 196 s
ACTIVATED CLOTTING TIME: 208 s
Activated Clotting Time: 190 seconds
Activated Clotting Time: 190 seconds
Activated Clotting Time: 196 seconds
Activated Clotting Time: 196 seconds

## 2015-01-27 LAB — GLUCOSE, CAPILLARY
GLUCOSE-CAPILLARY: 143 mg/dL — AB (ref 70–99)
GLUCOSE-CAPILLARY: 121 mg/dL — AB (ref 70–99)
Glucose-Capillary: 131 mg/dL — ABNORMAL HIGH (ref 70–99)
Glucose-Capillary: 134 mg/dL — ABNORMAL HIGH (ref 70–99)
Glucose-Capillary: 156 mg/dL — ABNORMAL HIGH (ref 70–99)
Glucose-Capillary: 87 mg/dL (ref 70–99)

## 2015-01-27 LAB — CBC
HCT: 29 % — ABNORMAL LOW (ref 36.0–46.0)
HEMOGLOBIN: 9.2 g/dL — AB (ref 12.0–15.0)
MCH: 27.3 pg (ref 26.0–34.0)
MCHC: 31.7 g/dL (ref 30.0–36.0)
MCV: 86.1 fL (ref 78.0–100.0)
PLATELETS: 226 10*3/uL (ref 150–400)
RBC: 3.37 MIL/uL — ABNORMAL LOW (ref 3.87–5.11)
RDW: 16 % — AB (ref 11.5–15.5)
WBC: 8.6 10*3/uL (ref 4.0–10.5)

## 2015-01-27 LAB — PHOSPHORUS
Phosphorus: 2.2 mg/dL — ABNORMAL LOW (ref 2.3–4.6)
Phosphorus: 2.4 mg/dL (ref 2.3–4.6)

## 2015-01-27 LAB — CULTURE, BLOOD (ROUTINE X 2): CULTURE: NO GROWTH

## 2015-01-27 LAB — PROCALCITONIN: Procalcitonin: 0.35 ng/mL

## 2015-01-27 LAB — CLOSTRIDIUM DIFFICILE BY PCR: Toxigenic C. Difficile by PCR: NEGATIVE

## 2015-01-27 LAB — MAGNESIUM
MAGNESIUM: 2.2 mg/dL (ref 1.5–2.5)
Magnesium: 2.4 mg/dL (ref 1.5–2.5)

## 2015-01-27 LAB — TRIGLYCERIDES: TRIGLYCERIDES: 234 mg/dL — AB (ref <150)

## 2015-01-27 LAB — APTT: APTT: 148 s — AB (ref 24–37)

## 2015-01-27 MED ORDER — PROPOFOL 10 MG/ML IV EMUL
0.0000 ug/kg/min | INTRAVENOUS | Status: DC
  Administered 2015-01-27: 30 ug/kg/min via INTRAVENOUS
  Administered 2015-01-27: 40 ug/kg/min via INTRAVENOUS
  Administered 2015-01-28 (×3): 50 ug/kg/min via INTRAVENOUS
  Administered 2015-01-28: 40 ug/kg/min via INTRAVENOUS
  Administered 2015-01-28: 30 ug/kg/min via INTRAVENOUS
  Administered 2015-01-29: 50 ug/kg/min via INTRAVENOUS
  Filled 2015-01-27 (×2): qty 200
  Filled 2015-01-27 (×6): qty 100

## 2015-01-27 NOTE — Progress Notes (Signed)
Pt tube feeding residuals > 500 ml despite tube feeding protocol being followed; Dr Nelda Marseille notified; orders received to stop tube feeding; obtain dietician consult; and place OG to low intermittent wall suction; Will continue to monitor.  Lenor Coffin, RN

## 2015-01-27 NOTE — Progress Notes (Signed)
[facility] ROUNDING NOTE   Subjective:   Interval History: no complaints having trouble weaning very agitiated when lightening sedation  Objective:  Vital signs in last 24 hours:  Temp:  [96.5 F (35.8 C)-98.4 F (36.9 C)] 96.7 F (35.9 C) (03/26 0645) Pulse Rate:  [80-159] 140 (03/26 0913) Resp:  [15-23] 20 (03/26 0913) BP: (75-178)/(53-92) 129/77 mmHg (03/26 0913) SpO2:  [100 %] 100 % (03/26 0913) FiO2 (%):  [40 %] 40 % (03/26 0923) Weight:  [56.7 kg (125 lb)] 56.7 kg (125 lb) (03/26 0451)  Weight change: -1.2 kg (-2 lb 10.3 oz) Filed Weights   01/25/15 0410 01/26/15 0348 01/27/15 0451  Weight: 59.6 kg (131 lb 6.3 oz) 57.9 kg (127 lb 10.3 oz) 56.7 kg (125 lb)    Intake/Output: I/O last 3 completed shifts: In: 3684.8 [I.V.:2249.8; NG/GT:1085; IV Piggyback:350] Out: 9381 [Urine:90; Other:3935]   Intake/Output this shift:  Total I/O In: -  Out: 53 [Other:284]  CVS- RRR RS- CTA  ETT ABD- BS present soft non-distended EXT- no edema   Basic Metabolic Panel:  Recent Labs Lab 01/25/15 0400 01/25/15 1527 01/25/15 1644 01/26/15 0355 01/26/15 1510 01/27/15 0400 01/27/15 0445  NA 138 138  --  135 138  --  138  K 3.6 3.9  --  3.9 3.7  --  3.7  CL 105 103  --  102 105  --  100  CO2 24 27  --  23 23  --  25  GLUCOSE 111* 126*  --  100* 121*  --  152*  BUN 13 14  --  21 21  --  18  CREATININE 1.37* 1.46*  --  2.42* 2.28*  --  1.76*  CALCIUM 8.0* 8.7  --  8.7 9.0  --  9.4  MG 2.0  --  2.1 1.9 2.1 2.2  --   PHOS 1.9* 3.8 3.7 3.1  3.2 3.0 2.2* 2.2*    Liver Function Tests:  Recent Labs Lab 01/21/15 1630  01/25/15 0400 01/25/15 1527 01/26/15 0355 01/26/15 1510 01/27/15 0445  AST 17  --   --   --   --   --   --   ALT 7  --   --   --   --   --   --   ALKPHOS 63  --   --   --   --   --   --   BILITOT 0.6  --   --   --   --   --   --   PROT 5.9*  --   --   --   --   --   --   ALBUMIN 2.3*  < > 2.0* 2.3* 2.1* 2.2* 2.2*  < > = values in this  interval not displayed.  Recent Labs Lab 01/21/15 1630  LIPASE 18  AMYLASE 98   No results for input(s): AMMONIA in the last 168 hours.  CBC:  Recent Labs Lab 01/24/15 0400 01/24/15 1139 01/25/15 0400 01/26/15 0355 01/27/15 0400  WBC 7.0 7.4 6.8 7.8 8.6  HGB 6.1* 9.2* 9.2* 9.1* 9.2*  HCT 18.6* 27.3* 27.5* 27.8* 29.0*  MCV 81.9 83.0 82.8 84.2 86.1  PLT 190 183 190 215 226    Cardiac Enzymes:  Recent Labs Lab 01/21/15 1630 01/21/15 2308  TROPONINI 0.65* 0.74*    BNP: Invalid input(s): POCBNP  CBG:  Recent Labs Lab 01/26/15 1515 01/26/15 1953 01/27/15 0010 01/27/15 0410 01/27/15 0827  GLUCAP 108* 106*  134* 6* 121*    Microbiology: Results for orders placed or performed during the hospital encounter of 01/18/15  MRSA PCR Screening     Status: None   Collection Time: 01/21/15 10:04 AM  Result Value Ref Range Status   MRSA by PCR NEGATIVE NEGATIVE Final    Comment:        The GeneXpert MRSA Assay (FDA approved for NASAL specimens only), is one component of a comprehensive MRSA colonization surveillance program. It is not intended to diagnose MRSA infection nor to guide or monitor treatment for MRSA infections.   Culture, blood (routine x 2)     Status: None   Collection Time: 01/21/15 11:30 AM  Result Value Ref Range Status   Specimen Description BLOOD LEFT ASSIST CONTROL  Final   Special Requests BOTTLES DRAWN AEROBIC ONLY 8CC  Final   Culture   Final    NO GROWTH 5 DAYS Performed at [facility]    Report Status 01/27/2015 FINAL  Final  Culture, Urine     Status: None   Collection Time: 01/21/15  4:24 PM  Result Value Ref Range Status   Specimen Description URINE, CATHETERIZED  Final   Special Requests Immunocompromised  Final   Colony Count NO GROWTH Performed at [facility]   Final   Culture NO GROWTH Performed at [facility]   Final   Report Status 01/22/2015 FINAL  Final    Coagulation Studies: No  results for input(s): LABPROT, INR in the last 72 hours.  Urinalysis: No results for input(s): COLORURINE, LABSPEC, PHURINE, GLUCOSEU, HGBUR, BILIRUBINUR, KETONESUR, PROTEINUR, UROBILINOGEN, NITRITE, LEUKOCYTESUR in the last 72 hours.  Invalid input(s): APPERANCEUR    Imaging: Dg Chest Port 1 View  01/27/2015   CLINICAL DATA:  Hypoxia/respiratory failure  EXAM: PORTABLE CHEST - 1 VIEW  COMPARISON:  January 26, 2015  FINDINGS: Endotracheal tube tip is 5.6 cm above the carina. Nasogastric tube tip and side port are in the stomach. There are two central catheters, each with tips in the superior vena cava. No pneumothorax. There is minimal left lower lobe atelectasis. Lungs elsewhere clear. Heart is upper normal in size with pulmonary vascularity within normal limits. No adenopathy.  IMPRESSION: Tube and catheter positions as described without pneumothorax. Minimal atelectasis on the left. Lungs otherwise clear.   Electronically Signed   By: Lowella Grip III M.D.   On: 01/27/2015 07:39   Dg Chest Port 1 View  01/26/2015   CLINICAL DATA:  Acute respiratory failure  EXAM: PORTABLE CHEST - 1 VIEW  COMPARISON:  Portable chest x-ray of January 25, 2015  FINDINGS: The left hemidiaphragm is excluded from the field of view. The lungs are well-expanded and clear. The cardiac silhouette is top-normal in size. The pulmonary vascularity is normal. No significant pleural effusion is demonstrated. The endotracheal tube, esophagogastric tube, and right and left internal jugular venous catheters are unchanged.  IMPRESSION: There is no active cardiopulmonary disease. The visualized support tubes and lines are in appropriate position radiographically.   Electronically Signed   By: David  Martinique   On: 01/26/2015 07:40     Medications:   . dexmedetomidine 0.7 mcg/kg/hr (01/27/15 0451)  . dextrose 5 % and 0.9% NaCl 10 mL/hr at 01/26/15 1200  . fentaNYL infusion INTRAVENOUS 350 mcg/hr (01/27/15 0159)  . heparin 10,000  units/ 20 mL infusion syringe 1,300 Units/hr (01/27/15 0800)  . dialysis replacement fluid (prismasate) 300 mL/hr at 01/26/15 2158  . dialysis replacement fluid (prismasate) 200  mL/hr at 01/26/15 1000  . dialysate (PRISMASATE) 1,000 mL/hr at 01/27/15 0742   . antiseptic oral rinse  7 mL Mouth Rinse QID  . chlorhexidine  15 mL Mouth Rinse BID  . darbepoetin (ARANESP) injection - NON-DIALYSIS  150 mcg Subcutaneous Q Fri-1800  . famotidine (PEPCID) IV  20 mg Intravenous Q24H  . feeding supplement (PRO-STAT SUGAR FREE 64)  60 mL Per Tube BID  . feeding supplement (VITAL HIGH PROTEIN)  1,000 mL Per Tube Q24H  . folic acid  1 mg Oral Daily  . multivitamin  1 tablet Oral QHS  . piperacillin-tazobactam (ZOSYN)  IV  2.25 g Intravenous 4 times per day  . sodium chloride  3 mL Intravenous Q12H  . thiamine  100 mg Oral Daily   acetaminophen, fentaNYL, heparin, heparin, heparin, heparin, hydrALAZINE, hydrocortisone-pramoxine, midazolam, ondansetron **OR** ondansetron (ZOFRAN) IV  Assessment/ Plan:  1. 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 Metabolic acidosis - resolved  3. Resp failure -  Slow wean off vent  Fluid removal with CRRT  4. Fever/leukocytosis - empiric zosyn 5. Sustained tachycardia - SVT vs sinus tachy. Trops mildly up. EF 40-45%.  6. Diarrhea - resolved  7. SLE - Patient now off immunosuppressants 8. Anemia - Aranesp. Hemoglobin continues to fall and is now undergoing a transfusion 9. Secondary hyperpara - last PTH 161 outpt CKA - calcitriol when able to use gi tract  Will continue CRRT today No changes continue heparin anticoagulation and will back off fluid removal today due to hypotension    LOS: 8 [name] [name] @TODAY @9 :36 AM

## 2015-01-27 NOTE — Progress Notes (Signed)
PULMONARY / CRITICAL CARE MEDICINE   Name: [name] [name] MRN: [id number] DOB: Jun 21, 1961    ADMISSION DATE:  01/18/2015 CONSULTATION DATE:  3/20  REFERRING MD :  Eliseo Squires   CHIEF COMPLAINT:  Tachycardia, resp failure   INITIAL PRESENTATION:  54 y/o female with hx lupus (on cellcept, plaquenil) and associated CKD, DM, HTN initially presented 3/17 with vague c/o SOB, diarrhea, malaise, weakness, cough.  She was admitted by [facility] and r/o for flu.  Was undergoing further w/u for diarrhea, mildly elevated troponin and anemia.  Overnight 3/19 developed intermittent SVT, then persistent tachycardia with HR 150's and worsening SOB and hypoxia requiring NRB.  PCCM consulted 3/20.  She required ETT/MV 3/20. CXR with B infiltrates. Initiated CVVHD on 3/21 pm.   STUDIES:  CXR 3/20>>> neg  2D echo 3/19>>> KZ60-10%, grade 1 diastolic dysfunction, mod AR, mod MR, mod TR. No pericardial effusion.  LE doppler US 3/20 >> prelim result = negative >>   SIGNIFICANT EVENTS: 3/21  Adenosine challenge >> appears to be sinus rhythm with some non-conducted p-waves 3/23  Tolerated CVVHD, I/O even last 24 h, tachycardia improved, fent + propofol 3/24  High residuals overnight, on max dose fentanyl.  Rx'd with Reglan   SUBJECTIVE:  RN reports agitation with WUA / SBT, tachypnea.  Remains on 400 mcg fent, precedex gtt  VITAL SIGNS: Temp:  [96.5 F (35.8 C)-99.5 F (37.5 C)] 99.3 F (37.4 C) (03/26 1115) Pulse Rate:  [80-162] 106 (03/26 1115) Resp:  [15-23] 20 (03/26 1115) BP: (75-203)/(53-133) 81/59 mmHg (03/26 1115) SpO2:  [100 %] 100 % (03/26 1115) FiO2 (%):  [40 %] 40 % (03/26 0923) Weight:  [125 lb (56.7 kg)] 125 lb (56.7 kg) (03/26 0451)   HEMODYNAMICS: CVP:  [9 mmHg] 9 mmHg   VENTILATOR SETTINGS: Vent Mode:  [-] PRVC FiO2 (%):  [40 %] 40 % Set Rate:  [20 bmp] 20 bmp Vt Set:  [480 mL] 480 mL PEEP:  [5 cmH20] 5 cmH20 Plateau Pressure:  [20 XNA35-57 cmH20] 24 cmH20   INTAKE /  OUTPUT:  Intake/Output Summary (Last 24 hours) at 01/27/15 1215 Last data filed at 01/27/15 1100  Gross per 24 hour  Intake 2383.11 ml  Output   4103 ml  Net -1719.89 ml    PHYSICAL EXAMINATION: General:  Chronically ill appearing female, intubated and sedated Neuro:  Sedate, localizes, intermittent follow commands, int agitation HEENT:  MM dry, ETT in place, clear oral secretions Cardiovascular:  s1s2 tachy  Lungs:  Clear bilaterally   Abdomen:  round, mildly distended, soft, BSx4 active   Musculoskeletal:  Warm and dry, no edema  LABS:  CBC  Recent Labs Lab 01/25/15 0400 01/26/15 0355 01/27/15 0400  WBC 6.8 7.8 8.6  HGB 9.2* 9.1* 9.2*  HCT 27.5* 27.8* 29.0*  PLT 190 215 226   Coag's  Recent Labs Lab 01/27/15 0400  APTT 148*    BMET  Recent Labs Lab 01/26/15 0355 01/26/15 1510 01/27/15 0445  NA 135 138 138  K 3.9 3.7 3.7  CL 102 105 100  CO2 23 23 25   BUN 21 21 18   CREATININE 2.42* 2.28* 1.76*  GLUCOSE 100* 121* 152*   Electrolytes  Recent Labs Lab 01/26/15 0355 01/26/15 1510 01/27/15 0400 01/27/15 0445  CALCIUM 8.7 9.0  --  9.4  MG 1.9 2.1 2.2  --   PHOS 3.1  3.2 3.0 2.2* 2.2*   Sepsis Markers  Recent Labs Lab 01/21/15 1646  01/23/15 0520 01/26/15 0355 01/27/15  62  LATICACIDVEN 0.9  --   --   --   --   PROCALCITON  --   < > 1.81 1.09 0.35  < > = values in this interval not displayed.   ABG  Recent Labs Lab 01/21/15 0955 01/21/15 1753 01/23/15 0312  PHART 7.329* 7.269* 7.327*  PCO2ART 27.8* 30.6* 32.9*  PO2ART 77.0* 154.0* 165.0*   Liver Enzymes  Recent Labs Lab 01/21/15 1630  01/26/15 0355 01/26/15 1510 01/27/15 0445  AST 17  --   --   --   --   ALT 7  --   --   --   --   ALKPHOS 63  --   --   --   --   BILITOT 0.6  --   --   --   --   ALBUMIN 2.3*  < > 2.1* 2.2* 2.2*  < > = values in this interval not displayed.   Cardiac Enzymes  Recent Labs Lab 01/21/15 1630 01/21/15 2308  TROPONINI 0.65* 0.74*    Glucose  Recent Labs Lab 01/26/15 1122 01/26/15 1515 01/26/15 1953 01/27/15 0010 01/27/15 0410 01/27/15 0827  GLUCAP 135* 108* 106* 134* 143* 121*    Imaging Dg Chest Port 1 View  01/26/2015   CLINICAL DATA:  Acute respiratory failure  EXAM: PORTABLE CHEST - 1 VIEW  COMPARISON:  Portable chest x-ray of January 25, 2015  FINDINGS: The left hemidiaphragm is excluded from the field of view. The lungs are well-expanded and clear. The cardiac silhouette is top-normal in size. The pulmonary vascularity is normal. No significant pleural effusion is demonstrated. The endotracheal tube, esophagogastric tube, and right and left internal jugular venous catheters are unchanged.  IMPRESSION: There is no active cardiopulmonary disease. The visualized support tubes and lines are in appropriate position radiographically.   Electronically Signed   By: David  Martinique   On: 01/26/2015 07:40    ASSESSMENT / PLAN:  PULMONARY Acute hypoxic respiratory failure - unknown etiology but in setting B infiltrates. Have considered and treated for HCAP, PE (d/c'd heparin 3/23). Consider acute pulmonary edema in setting HTN and renal failure. Also consider lupus pneumonitis although infiltrates have almost completely cleared. Most likely cause is cardiogenic edema.  Gas exchange is much improved.  Mental status is the issue with extubation at this point.  Patient is very agitated.  P:   Daily WUA / SBT with goal extubation once agitation/HR controlled CXR PRN CXR and ABG in AM Vent adjusted for ABG.  CARDIOVASCULAR SVT/sinus tachycardia - HR was 150 at time of her acute decompensation, Adenosine given, appeared to be sinus with some non-conducted p-waves. Presumed reactive to critical illness Hypotension Mildly elevated troponin peak 0.74 dCHF - new.  EF 40-45% with MOD aortic, tricuspid and mitral regurg.  ??PE - BLE venous dopplers prelim negative; doubt PE (heparin gtt stopped 3/23) P:  Hydralazine PRN   Hold home labetalol, clonidine  Consider restart low dose beta blocker in am 3/27 pending BP trend review (labile with sedation)  RENAL Acute on chronic renal failure, presume lupus nephritis.  Hyponatremia  Metabolic acidosis P:   Appreciate renal service's assistance Resumed  CVVHD - even balance She is not expected to recover her renal fxn Discontinue foley   GASTROINTESTINAL Diarrhea - initial resolved.  New with reglan 3/26, flexi seal placed.  Abd pain - neg abd xray  High Gastric Residuals Protein Calorie Malnutrition  P:   Hold TF's due to high residual Hold further reglan  Flexi-seal 3/26 Pepcid   HEMATOLOGIC Leukocytosis  Anemia - s/p 1 unit PRBC 3/22 P:  Cont iron  F/u CBC  INFECTIOUS Fever  Immunocompromised Host Diarrhea - resolved Unclear etiology of infection, consider PNA in immunocompromised host but CXR has quickly cleared.  ??GI given diarrhea v viral v other source (ie lupus pneumonitis).  Immunocompromised on rx for lupus.  P:   BCx2 3/20 >>>ng UC 3/20 >>> negative  Vanc 3/20 (empiric) >>> 3/22 Zosyn 3/20 (empiric) >>>  C diff cancelled as no further diarrhea Empiric broad spectrum abx  - pct to decide duration Hold cellcept, plaquenil temporarily - will plan to restart when stabilized  ENDOCRINE No active issue    P:   Monitor glucose on chem   NEUROLOGIC Sedation needs  P:   RASS goal: 0 to -1 Fentanyl gtt, allow for lighter sedation, goal to minimize, use precedex gtt & hopeful extubation soon PRN versed   FAMILY  - Updates: Mother updated 3/26 at bedside.   Noe Gens, NP-C [facility] Pgr: [phone] or [phone]  Patient is deeply sedated right now, will allow rest overnight then wake up in AM and transition to precedex inorder to test respiratory status.  The patient is volume optimum at this point and if will ever wean now will be the time.  In the meantime, hold TF, no further reglan (now has  diarrhea).  Continue full vent support for now.  Above note edited.  The patient is critically ill with multiple organ systems failure and requires high complexity decision making for assessment and support, frequent evaluation and titration of therapies, application of advanced monitoring technologies and extensive interpretation of multiple databases.   Critical Care Time devoted to patient care services described in this note is  35  Minutes. This time reflects time of care of this signee Dr Jennet Maduro. This critical care time does not reflect procedure time, or teaching time or supervisory time of PA/NP/Med student/Med Resident etc but could involve care discussion time.  Rush Farmer, M.D. [facility]. Pager: [phone]. After hours pager: [phone].  01/27/2015, 12:15 PM

## 2015-01-28 ENCOUNTER — Inpatient Hospital Stay (HOSPITAL_COMMUNITY): Payer: Managed Care, Other (non HMO)

## 2015-01-28 LAB — GLUCOSE, CAPILLARY
GLUCOSE-CAPILLARY: 119 mg/dL — AB (ref 70–99)
Glucose-Capillary: 93 mg/dL (ref 70–99)
Glucose-Capillary: 93 mg/dL (ref 70–99)
Glucose-Capillary: 126 mg/dL — ABNORMAL HIGH (ref 70–99)
Glucose-Capillary: 149 mg/dL — ABNORMAL HIGH (ref 70–99)
Glucose-Capillary: 120 mg/dL — ABNORMAL HIGH (ref 70–99)

## 2015-01-28 LAB — RENAL FUNCTION PANEL
ALBUMIN: 2.6 g/dL — AB (ref 3.5–5.2)
ANION GAP: 10 (ref 5–15)
Albumin: 2.5 g/dL — ABNORMAL LOW (ref 3.5–5.2)
Anion gap: 15 (ref 5–15)
BUN: 15 mg/dL (ref 6–23)
BUN: 15 mg/dL (ref 6–23)
CALCIUM: 9.8 mg/dL (ref 8.4–10.5)
CHLORIDE: 98 mmol/L (ref 96–112)
CO2: 27 mmol/L (ref 19–32)
CO2: 24 mmol/L (ref 19–32)
CREATININE: 1.69 mg/dL — AB (ref 0.50–1.10)
Calcium: 9.6 mg/dL (ref 8.4–10.5)
Chloride: 100 mmol/L (ref 96–112)
Creatinine, Ser: 1.61 mg/dL — ABNORMAL HIGH (ref 0.50–1.10)
GFR calc Af Amer: 41 mL/min — ABNORMAL LOW (ref >90)
GFR calc non Af Amer: 36 mL/min — ABNORMAL LOW (ref >90)
GFR, EST AFRICAN AMERICAN: 39 mL/min — AB (ref >90)
GFR, EST NON AFRICAN AMERICAN: 34 mL/min — AB (ref >90)
GLUCOSE: 110 mg/dL — AB (ref 70–99)
Glucose, Bld: 125 mg/dL — ABNORMAL HIGH (ref 70–99)
PHOSPHORUS: 1.8 mg/dL — AB (ref 2.3–4.6)
Phosphorus: 6.1 mg/dL — ABNORMAL HIGH (ref 2.3–4.6)
Potassium: 3.7 mmol/L (ref 3.5–5.1)
Potassium: 3.7 mmol/L (ref 3.5–5.1)
SODIUM: 137 mmol/L (ref 135–145)
SODIUM: 137 mmol/L (ref 135–145)

## 2015-01-28 LAB — PROCALCITONIN: Procalcitonin: 0.26 ng/mL

## 2015-01-28 LAB — POCT ACTIVATED CLOTTING TIME
ACTIVATED CLOTTING TIME: 202 s
ACTIVATED CLOTTING TIME: 208 s
ACTIVATED CLOTTING TIME: 184 s
ACTIVATED CLOTTING TIME: 202 s
ACTIVATED CLOTTING TIME: 208 s
Activated Clotting Time: 183 seconds
Activated Clotting Time: 196 seconds
Activated Clotting Time: 208 seconds
Activated Clotting Time: 208 seconds

## 2015-01-28 LAB — CBC
HCT: 30.3 % — ABNORMAL LOW (ref 36.0–46.0)
Hemoglobin: 9.6 g/dL — ABNORMAL LOW (ref 12.0–15.0)
MCH: 27.5 pg (ref 26.0–34.0)
MCHC: 31.7 g/dL (ref 30.0–36.0)
MCV: 86.8 fL (ref 78.0–100.0)
PLATELETS: 278 10*3/uL (ref 150–400)
RBC: 3.49 MIL/uL — ABNORMAL LOW (ref 3.87–5.11)
RDW: 16.3 % — AB (ref 11.5–15.5)
WBC: 9.9 10*3/uL (ref 4.0–10.5)

## 2015-01-28 LAB — MAGNESIUM: Magnesium: 2.3 mg/dL (ref 1.5–2.5)

## 2015-01-28 LAB — AMMONIA: Ammonia: 21 umol/L (ref 11–32)

## 2015-01-28 LAB — APTT: APTT: 144 s — AB (ref 24–37)

## 2015-01-28 MED ORDER — QUETIAPINE FUMARATE 25 MG PO TABS
25.0000 mg | ORAL_TABLET | Freq: Two times a day (BID) | ORAL | Status: DC
  Administered 2015-01-28 – 2015-01-29 (×4): 25 mg via ORAL
  Filled 2015-01-28 (×6): qty 1

## 2015-01-28 MED ORDER — LABETALOL HCL 100 MG PO TABS
100.0000 mg | ORAL_TABLET | Freq: Two times a day (BID) | ORAL | Status: DC
  Administered 2015-01-28 – 2015-01-29 (×3): 100 mg via ORAL
  Filled 2015-01-28 (×4): qty 1

## 2015-01-28 MED ORDER — SODIUM PHOSPHATE 3 MMOLE/ML IV SOLN
20.0000 mmol | Freq: Once | INTRAVENOUS | Status: AC
  Administered 2015-01-28: 20 mmol via INTRAVENOUS
  Filled 2015-01-28: qty 6.67

## 2015-01-28 NOTE — Progress Notes (Signed)
Patient transported to CT and returned to 7C62 without complications. Vital signs stable at this time. Patient tolerated trip well. Once returned to room, RT replaced pt ETT holder. Patient tolerated well. RN at bedside. RT will continue to monitor.

## 2015-01-28 NOTE — Progress Notes (Signed)
PULMONARY / CRITICAL CARE MEDICINE   Name: [name] [name] MRN: [id number] DOB: 06/23/1961    ADMISSION DATE:  01/18/2015 CONSULTATION DATE:  3/20  REFERRING MD :  Eliseo Squires   CHIEF COMPLAINT:  Tachycardia, resp failure   INITIAL PRESENTATION:  54 y/o female with hx lupus (on cellcept, plaquenil) and associated CKD, DM, HTN initially presented 3/17 with vague c/o SOB, diarrhea, malaise, weakness, cough.  She was admitted by [facility] and r/o for flu.  Was undergoing further w/u for diarrhea, mildly elevated troponin and anemia.  Overnight 3/19 developed intermittent SVT, then persistent tachycardia with HR 150's and worsening SOB and hypoxia requiring NRB.  PCCM consulted 3/20.  She required ETT/MV 3/20. CXR with B infiltrates. Initiated CVVHD on 3/21 pm.   STUDIES:  CXR 3/20>>> neg  2D echo 3/19>>> [id number]%, grade 1 diastolic dysfunction, mod AR, mod MR, mod TR. No pericardial effusion.  LE doppler US 3/20 >> prelim result = negative >>   SIGNIFICANT EVENTS: 3/21  Adenosine challenge >> appears to be sinus rhythm with some non-conducted p-waves 3/23  Tolerated CVVHD, I/O even last 24 h, tachycardia improved, fent + propofol 3/24  High residuals overnight, on max dose fentanyl.  Rx'd with Reglan  3/26  Continues to have agitation with WUA / SBT, changed back to propofol/fentanyl   SUBJECTIVE:  Continues to have intermittent agitation, not tolerating weaning.  VITAL SIGNS: Temp:  [97.3 F (36.3 C)-98.1 F (36.7 C)] 97.4 F (36.3 C) (03/27 0800) Pulse Rate:  [90-158] 120 (03/27 1115) Resp:  [19-33] 20 (03/27 1115) BP: (75-237)/(46-129) 115/76 mmHg (03/27 1115) SpO2:  [100 %] 100 % (03/27 0900) FiO2 (%):  [40 %] 40 % (03/27 1145) Weight:  [119 lb 0.8 oz (54 kg)] 119 lb 0.8 oz (54 kg) (03/27 0500)   VENTILATOR SETTINGS: Vent Mode:  [-] PRVC FiO2 (%):  [40 %] 40 % Set Rate:  [20 bmp] 20 bmp Vt Set:  [480 mL] 480 mL PEEP:  [5 cmH20] 5 cmH20 Pressure Support:  [18 cmH20] 18 cmH20 Plateau  Pressure:  [18 cmH20-26 cmH20] 19 cmH20   INTAKE / OUTPUT:  Intake/Output Summary (Last 24 hours) at 01/28/15 1200 Last data filed at 01/28/15 0900  Gross per 24 hour  Intake 1450.8 ml  Output   2964 ml  Net -1513.2 ml    PHYSICAL EXAMINATION: General:  Chronically ill appearing female, intubated and sedated Neuro:  Sedate, localizes, intermittent follow commands, int agitation HEENT:  MM dry, ETT in place, clear oral secretions Cardiovascular:  s1s2 tachy  Lungs:  Clear bilaterally   Abdomen:  round, mildly distended, soft, BSx4 active   Musculoskeletal:  Warm and dry, no edema  LABS:  CBC  Recent Labs Lab 01/26/15 0355 01/27/15 0400 01/28/15 0345  WBC 7.8 8.6 9.9  HGB 9.1* 9.2* 9.6*  HCT 27.8* 29.0* 30.3*  PLT 215 226 278   Coag's  Recent Labs Lab 01/27/15 0400 01/28/15 0345  APTT 148* 144*    BMET  Recent Labs Lab 01/27/15 0445 01/27/15 1515 01/28/15 0345  NA 138 136 137  K 3.7 3.6 3.7  CL 100 100 100  CO2 25 25 27   BUN 18 14 15   CREATININE 1.76* 1.71* 1.61*  GLUCOSE 152* 127* 110*   Electrolytes  Recent Labs Lab 01/27/15 0400 01/27/15 0445 01/27/15 1515 01/28/15 0345  CALCIUM  --  9.4 9.1 9.6  MG 2.2  --  2.4 2.3  PHOS 2.2* 2.2* 2.4  2.4 1.8*   Sepsis Markers  Recent Labs Lab 01/21/15 1646  01/26/15 0355 01/27/15 0445 01/28/15 0345  LATICACIDVEN 0.9  --   --   --   --   PROCALCITON  --   < > 1.09 0.35 0.26  < > = values in this interval not displayed.   ABG  Recent Labs Lab 01/21/15 1753 01/23/15 0312  PHART 7.269* 7.327*  PCO2ART 30.6* 32.9*  PO2ART 154.0* 165.0*   Liver Enzymes  Recent Labs Lab 01/21/15 1630  01/27/15 0445 01/27/15 1515 01/28/15 0345  AST 17  --   --   --   --   ALT 7  --   --   --   --   ALKPHOS 63  --   --   --   --   BILITOT 0.6  --   --   --   --   ALBUMIN 2.3*  < > 2.2* 2.4* 2.5*  < > = values in this interval not displayed.   Cardiac Enzymes  Recent Labs Lab 01/21/15 1630  01/21/15 2308  TROPONINI 0.65* 0.74*   Glucose  Recent Labs Lab 01/27/15 1213 01/27/15 1502 01/27/15 1953 01/28/15 0001 01/28/15 0358 01/28/15 0754  GLUCAP 156* 131* 87 93 93 126*    Imaging Dg Chest Port 1 View  01/27/2015   CLINICAL DATA:  Hypoxia/respiratory failure  EXAM: PORTABLE CHEST - 1 VIEW  COMPARISON:  January 26, 2015  FINDINGS: Endotracheal tube tip is 5.6 cm above the carina. Nasogastric tube tip and side port are in the stomach. There are two central catheters, each with tips in the superior vena cava. No pneumothorax. There is minimal left lower lobe atelectasis. Lungs elsewhere clear. Heart is upper normal in size with pulmonary vascularity within normal limits. No adenopathy.  IMPRESSION: Tube and catheter positions as described without pneumothorax. Minimal atelectasis on the left. Lungs otherwise clear.   Electronically Signed   By: Lowella Grip III M.D.   On: 01/27/2015 07:39    ASSESSMENT / PLAN:  PULMONARY Acute hypoxic respiratory failure - unknown etiology but in setting B infiltrates. Have considered and treated for HCAP, PE (d/c'd heparin 3/23). Consider acute pulmonary edema in setting HTN and renal failure. Also consider lupus pneumonitis although infiltrates have almost completely cleared. Most likely cause is cardiogenic edema.  Gas exchange is much improved.  Mental status is the issue with extubation at this point.  Patient is very agitated.  P:   Daily WUA / SBT with goal extubation once agitation/HR controlled  CXR PRN CXR and ABG in AM Vent adjusted for ABG.  CARDIOVASCULAR SVT/sinus tachycardia - HR was 150 at time of her acute decompensation, Adenosine given, appeared to be sinus with some non-conducted p-waves. Presumed reactive to critical illness Hypotension Mildly elevated troponin peak 0.74 dCHF - new.  EF 40-45% with MOD aortic, tricuspid and mitral regurg.  ??PE - BLE venous dopplers prelim negative; doubt PE (heparin gtt stopped  3/23) P:  Hydralazine PRN  Resume home labetalol 100 mg BID (reduced home dose, hx of non-compliance) Hold home clonidine  RENAL Acute on chronic renal failure, presume lupus nephritis.  Hyponatremia  Metabolic acidosis P:   Appreciate renal service's assistance Resumed  CVVHD - even balance, she is not expected to recover her renal fxn  GASTROINTESTINAL Diarrhea - initial resolved.  New with reglan 3/26, flexi seal placed.  Abd pain - neg abd xray  High Gastric Residuals Protein Calorie Malnutrition  P:   Hold TF, Nutrition to review  Hold  further reglan  Flexi-seal 3/26 Pepcid  Assess ammonia to ensure no hepatic contribution to delirium   HEMATOLOGIC Leukocytosis  Anemia - s/p 1 unit PRBC 3/22 P:  Cont iron  F/u CBC  INFECTIOUS Fever - resolved  Immunocompromised Host Diarrhea - resolved Unclear etiology of infection, consider PNA in immunocompromised host but CXR has quickly cleared.  ??GI given diarrhea v viral v other source (ie lupus pneumonitis).  Immunocompromised on rx for lupus.  P:   BCx2 3/20 >>>neg UC 3/20 >>> negative  Vanc 3/20 (empiric) >>> 3/22 Zosyn 3/20 (empiric) >>>  C diff cancelled as no further diarrhea Empiric broad spectrum abx  - pct to decide duration, consider d/c in am 3/28 Hold cellcept, plaquenil temporarily - will plan to restart when stabilized  ENDOCRINE No active issue    P:   Monitor glucose on chem   NEUROLOGIC Sedation needs  Agitation / Delirium - r/o intracranial process  P:   RASS goal: 0 to -1 Fentanyl gtt, allow for lighter sedation, goal to minimize, use propofol gtt & hopeful extubation soon CT head to r/o neurologic event   FAMILY  - Updates: Mother updated 3/26 at bedside.   Noe Gens, NP-C [facility] Pgr: [phone] or [phone]  Continue home labetalol, adjust vent for ABG, begin PS but not extubation given mental status.  Will continue further care as delineated above.  She  appears more comfortable this AM.  Event balance.  The patient is critically ill with multiple organ systems failure and requires high complexity decision making for assessment and support, frequent evaluation and titration of therapies, application of advanced monitoring technologies and extensive interpretation of multiple databases.   Critical Care Time devoted to patient care services described in this note is  35  Minutes. This time reflects time of care of this signee Dr Jennet Maduro. This critical care time does not reflect procedure time, or teaching time or supervisory time of PA/NP/Med student/Med Resident etc but could involve care discussion time.  Rush Farmer, M.D. [facility] Pulmonary/Critical Care Medicine. Pager: [phone]. After hours pager: [phone].  01/28/2015, 12:00 PM

## 2015-01-28 NOTE — Progress Notes (Signed)
Pt experienced 27 beat run of v-tach and converted back to ST spontaneously; Noe Gens, NP for CCM notified; no orders received; will continue to monitor.  Lenor Coffin, RN

## 2015-01-28 NOTE — Progress Notes (Signed)
[facility] ROUNDING NOTE   Subjective:   Interval History:sedated still not able to wean due to agitation and extreme changes in blood pressure and heart rate. Continues on CRRT at this time ( started 3/21)   Objective:  Vital signs in last 24 hours:  Temp:  [97.3 F (36.3 C)-99.5 F (37.5 C)] 97.4 F (36.3 C) (03/27 0800) Pulse Rate:  [90-158] 122 (03/27 0900) Resp:  [19-33] 19 (03/27 0900) BP: (75-237)/(46-129) 149/71 mmHg (03/27 0900) SpO2:  [100 %] 100 % (03/27 0900) FiO2 (%):  [40 %] 40 % (03/27 0850) Weight:  [54 kg (119 lb 0.8 oz)] 54 kg (119 lb 0.8 oz) (03/27 0500)  Weight change: -2.7 kg (-5 lb 15.2 oz) Filed Weights   01/26/15 0348 01/27/15 0451 01/28/15 0500  Weight: 57.9 kg (127 lb 10.3 oz) 56.7 kg (125 lb) 54 kg (119 lb 0.8 oz)    Intake/Output: I/O last 3 completed shifts: In: 3114.2 [I.V.:2010.2; NG/GT:804; IV [id number]:836] Out: 6294 [Emesis/NG output:800; Other:4012]   Intake/Output this shift:  Total I/O In: 152.2 [I.V.:102.2; IV Piggyback:50] Out: 765 [Emesis/NG output:150; YYTKP:546; Stool:350]  CVS- RRR ET FiO2 40 RS- CTA tachy ABD- BS present soft non-distended EXT- no edema   Basic Metabolic Panel:  Recent Labs Lab 01/26/15 0355 01/26/15 1510 01/27/15 0400 01/27/15 0445 01/27/15 1515 01/28/15 0345  NA 135 138  --  138 136 137  K 3.9 3.7  --  3.7 3.6 3.7  CL 102 105  --  100 100 100  CO2 23 23  --  25 25 27   GLUCOSE 100* 121*  --  152* 127* 110*  BUN 21 21  --  18 14 15   CREATININE 2.42* 2.28*  --  1.76* 1.71* 1.61*  CALCIUM 8.7 9.0  --  9.4 9.1 9.6  MG 1.9 2.1 2.2  --  2.4 2.3  PHOS 3.1  3.2 3.0 2.2* 2.2* 2.4  2.4 1.8*    Liver Function Tests:  Recent Labs Lab 01/21/15 1630  01/26/15 0355 01/26/15 1510 01/27/15 0445 01/27/15 1515 01/28/15 0345  AST 17  --   --   --   --   --   --   ALT 7  --   --   --   --   --   --   ALKPHOS 63  --   --   --   --   --   --   BILITOT 0.6  --   --   --   --   --   --    PROT 5.9*  --   --   --   --   --   --   ALBUMIN 2.3*  < > 2.1* 2.2* 2.2* 2.4* 2.5*  < > = values in this interval not displayed.  Recent Labs Lab 01/21/15 1630  LIPASE 18  AMYLASE 98   No results for input(s): AMMONIA in the last 168 hours.  CBC:  Recent Labs Lab 01/24/15 1139 01/25/15 0400 01/26/15 0355 01/27/15 0400 01/28/15 0345  WBC 7.4 6.8 7.8 8.6 9.9  HGB 9.2* 9.2* 9.1* 9.2* 9.6*  HCT 27.3* 27.5* 27.8* 29.0* 30.3*  MCV 83.0 82.8 84.2 86.1 86.8  PLT 183 190 215 226 278    Cardiac Enzymes:  Recent Labs Lab 01/21/15 1630 01/21/15 2308  TROPONINI 0.65* 0.74*    BNP: Invalid input(s): POCBNP  CBG:  Recent Labs Lab 01/27/15 1502 01/27/15 1953 01/28/15 0001 01/28/15 0358 01/28/15 0754  GLUCAP 131*  6 93 93 126*    Microbiology: Results for orders placed or performed during the hospital encounter of 01/18/15  MRSA PCR Screening     Status: None   Collection Time: 01/21/15 10:04 AM  Result Value Ref Range Status   MRSA by PCR NEGATIVE NEGATIVE Final    Comment:        The GeneXpert MRSA Assay (FDA approved for NASAL specimens only), is one component of a comprehensive MRSA colonization surveillance program. It is not intended to diagnose MRSA infection nor to guide or monitor treatment for MRSA infections.   Culture, blood (routine x 2)     Status: None   Collection Time: 01/21/15 11:30 AM  Result Value Ref Range Status   Specimen Description BLOOD LEFT ASSIST CONTROL  Final   Special Requests BOTTLES DRAWN AEROBIC ONLY 8CC  Final   Culture   Final    NO GROWTH 5 DAYS Performed at [facility]    Report Status 01/27/2015 FINAL  Final  Culture, Urine     Status: None   Collection Time: 01/21/15  4:24 PM  Result Value Ref Range Status   Specimen Description URINE, CATHETERIZED  Final   Special Requests Immunocompromised  Final   Colony Count NO GROWTH Performed at [facility]   Final   Culture NO GROWTH Performed  at [facility]   Final   Report Status 01/22/2015 FINAL  Final  Clostridium Difficile by PCR     Status: None   Collection Time: 01/27/15  9:38 AM  Result Value Ref Range Status   C difficile by pcr NEGATIVE NEGATIVE Final    Coagulation Studies: No results for input(s): LABPROT, INR in the last 72 hours.  Urinalysis: No results for input(s): COLORURINE, LABSPEC, PHURINE, GLUCOSEU, HGBUR, BILIRUBINUR, KETONESUR, PROTEINUR, UROBILINOGEN, NITRITE, LEUKOCYTESUR in the last 72 hours.  Invalid input(s): APPERANCEUR    Imaging: Dg Chest Port 1 View  01/28/2015   CLINICAL DATA:  Acute respiratory failure  EXAM: PORTABLE CHEST - 1 VIEW  COMPARISON:  01/27/2015  FINDINGS: Right IJ central line tip terminates over the superior SVC. Left IJ central line tip terminates over the mid SVC. Heart size normal. Nasogastric tube tip terminates below the level of the diaphragms but is not included in the field of view. The lungs are clear. No pleural effusion or pneumothorax.  IMPRESSION: No acute cardiopulmonary process.   Electronically Signed   By: Conchita Paris M.D.   On: 01/28/2015 08:15   Dg Chest Port 1 View  01/27/2015   CLINICAL DATA:  Hypoxia/respiratory failure  EXAM: PORTABLE CHEST - 1 VIEW  COMPARISON:  January 26, 2015  FINDINGS: Endotracheal tube tip is 5.6 cm above the carina. Nasogastric tube tip and side port are in the stomach. There are two central catheters, each with tips in the superior vena cava. No pneumothorax. There is minimal left lower lobe atelectasis. Lungs elsewhere clear. Heart is upper normal in size with pulmonary vascularity within normal limits. No adenopathy.  IMPRESSION: Tube and catheter positions as described without pneumothorax. Minimal atelectasis on the left. Lungs otherwise clear.   Electronically Signed   By: Lowella Grip III M.D.   On: 01/27/2015 07:39     Medications:   . dextrose 5 % and 0.9% NaCl 10 mL/hr at 01/26/15 1200  . fentaNYL infusion  INTRAVENOUS 200 mcg/hr (01/28/15 0900)  . heparin 10,000 units/ 20 mL infusion syringe 1,300 Units/hr (01/28/15 0858)  . dialysis replacement fluid (prismasate)  300 mL/hr at 01/28/15 0041  . dialysis replacement fluid (prismasate) 200 mL/hr at 01/28/15 0041  . dialysate (PRISMASATE) 1,000 mL/hr at 01/28/15 0515  . propofol 30 mcg/kg/min (01/28/15 0949)   . antiseptic oral rinse  7 mL Mouth Rinse QID  . chlorhexidine  15 mL Mouth Rinse BID  . darbepoetin (ARANESP) injection - NON-DIALYSIS  150 mcg Subcutaneous Q Fri-1800  . famotidine (PEPCID) IV  20 mg Intravenous Q24H  . feeding supplement (PRO-STAT SUGAR FREE 64)  60 mL Per Tube BID  . folic acid  1 mg Oral Daily  . multivitamin  1 tablet Oral QHS  . piperacillin-tazobactam (ZOSYN)  IV  2.25 g Intravenous 4 times per day  . QUEtiapine  25 mg Oral BID  . sodium chloride  3 mL Intravenous Q12H  . thiamine  100 mg Oral Daily   acetaminophen, fentaNYL, heparin, heparin, heparin, heparin, hydrALAZINE, hydrocortisone-pramoxine, ondansetron **OR** ondansetron (ZOFRAN) IV  Assessment/ Plan:  1. 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 Metabolic acidosis - resolved  3. Resp failure - No PE, infiltrates cleared ( favors pulmonary edema) 4. Fever/leukocytosis - empiric zosyn 5. Sustained tachycardia - SVT vs sinus tachy. Trops mildly up. EF 40-45%.  6. Diarrhea - resolved  7. SLE - Patient now off immunosuppressants. Will order some serologies ANA ds DNA and compliments although I am not sure if this will help direct management. Perhaps a trial of steroids could be administered. A renal biopsy may help although I suspect there will be high chronicity and low activity. She is already committed to dialysis and renal function failing as outpatient. 8. Anemia - Aranesp. Hemoglobin continues to fall and is now undergoing a transfusion 9. Secondary hyperpara - last PTH increased to > 1000 10. Altered  mental status  Seems to be the biggest issue at this point.  11. Hypophosphatemia  replete  Will continue CRRT today No changes      LOS: 9 Latrecia Capito W @TODAY @10 :20 AM

## 2015-01-29 ENCOUNTER — Inpatient Hospital Stay (HOSPITAL_COMMUNITY): Payer: Managed Care, Other (non HMO)

## 2015-01-29 DIAGNOSIS — G9340 Encephalopathy, unspecified: Secondary | ICD-10-CM

## 2015-01-29 DIAGNOSIS — F05 Delirium due to known physiological condition: Secondary | ICD-10-CM

## 2015-01-29 LAB — GLUCOSE, CAPILLARY
GLUCOSE-CAPILLARY: 113 mg/dL — AB (ref 70–99)
GLUCOSE-CAPILLARY: 96 mg/dL (ref 70–99)
GLUCOSE-CAPILLARY: 148 mg/dL — AB (ref 70–99)
GLUCOSE-CAPILLARY: 133 mg/dL — AB (ref 70–99)
Glucose-Capillary: 104 mg/dL — ABNORMAL HIGH (ref 70–99)
Glucose-Capillary: 148 mg/dL — ABNORMAL HIGH (ref 70–99)

## 2015-01-29 LAB — RENAL FUNCTION PANEL
ALBUMIN: 2.7 g/dL — AB (ref 3.5–5.2)
ANION GAP: 13 (ref 5–15)
Albumin: 2.6 g/dL — ABNORMAL LOW (ref 3.5–5.2)
Anion gap: 15 (ref 5–15)
BUN: 12 mg/dL (ref 6–23)
BUN: 14 mg/dL (ref 6–23)
CHLORIDE: 100 mmol/L (ref 96–112)
CO2: 26 mmol/L (ref 19–32)
CO2: 24 mmol/L (ref 19–32)
Calcium: 10.5 mg/dL (ref 8.4–10.5)
Calcium: 9.9 mg/dL (ref 8.4–10.5)
Chloride: 96 mmol/L (ref 96–112)
Creatinine, Ser: 1.61 mg/dL — ABNORMAL HIGH (ref 0.50–1.10)
Creatinine, Ser: 1.83 mg/dL — ABNORMAL HIGH (ref 0.50–1.10)
GFR calc Af Amer: 36 mL/min — ABNORMAL LOW (ref >90)
GFR calc non Af Amer: 36 mL/min — ABNORMAL LOW (ref >90)
GFR calc non Af Amer: 31 mL/min — ABNORMAL LOW (ref >90)
GFR, EST AFRICAN AMERICAN: 41 mL/min — AB (ref >90)
GLUCOSE: 128 mg/dL — AB (ref 70–99)
GLUCOSE: 144 mg/dL — AB (ref 70–99)
PHOSPHORUS: 4 mg/dL (ref 2.3–4.6)
POTASSIUM: 3.6 mmol/L (ref 3.5–5.1)
Phosphorus: 4.7 mg/dL — ABNORMAL HIGH (ref 2.3–4.6)
Potassium: 4.5 mmol/L (ref 3.5–5.1)
SODIUM: 137 mmol/L (ref 135–145)
Sodium: 137 mmol/L (ref 135–145)

## 2015-01-29 LAB — POCT ACTIVATED CLOTTING TIME
ACTIVATED CLOTTING TIME: 214 s
ACTIVATED CLOTTING TIME: 233 s
ACTIVATED CLOTTING TIME: 220 s
ACTIVATED CLOTTING TIME: 220 s
ACTIVATED CLOTTING TIME: 189 s
Activated Clotting Time: 214 seconds
Activated Clotting Time: 202 seconds
Activated Clotting Time: 208 seconds
Activated Clotting Time: 208 seconds

## 2015-01-29 LAB — CBC
HEMATOCRIT: 31.9 % — AB (ref 36.0–46.0)
Hemoglobin: 10.1 g/dL — ABNORMAL LOW (ref 12.0–15.0)
MCH: 28 pg (ref 26.0–34.0)
MCHC: 31.7 g/dL (ref 30.0–36.0)
MCV: 88.4 fL (ref 78.0–100.0)
Platelets: 365 10*3/uL (ref 150–400)
RBC: 3.61 MIL/uL — ABNORMAL LOW (ref 3.87–5.11)
RDW: 17.1 % — ABNORMAL HIGH (ref 11.5–15.5)
WBC: 9.6 10*3/uL (ref 4.0–10.5)

## 2015-01-29 LAB — C4 COMPLEMENT: Complement C4, Body Fluid: 55 mg/dL — ABNORMAL HIGH (ref 14–44)

## 2015-01-29 LAB — HEPATIC FUNCTION PANEL
ALK PHOS: 108 U/L (ref 39–117)
ALT: 18 U/L (ref 0–35)
AST: 49 U/L — ABNORMAL HIGH (ref 0–37)
Albumin: 2.6 g/dL — ABNORMAL LOW (ref 3.5–5.2)
BILIRUBIN INDIRECT: 0.6 mg/dL (ref 0.3–0.9)
Bilirubin, Direct: 0.2 mg/dL (ref 0.0–0.5)
Total Bilirubin: 0.8 mg/dL (ref 0.3–1.2)
Total Protein: 7 g/dL (ref 6.0–8.3)

## 2015-01-29 LAB — APTT: aPTT: >200 seconds (ref 24–37)

## 2015-01-29 LAB — MAGNESIUM: MAGNESIUM: 2.6 mg/dL — AB (ref 1.5–2.5)

## 2015-01-29 LAB — C3 COMPLEMENT: C3 Complement: 142 mg/dL (ref 82–167)

## 2015-01-29 MED ORDER — DOXERCALCIFEROL 4 MCG/2ML IV SOLN
1.0000 ug | INTRAVENOUS | Status: DC
  Administered 2015-01-29: 1 ug via INTRAVENOUS
  Filled 2015-01-29: qty 2

## 2015-01-29 MED ORDER — VITAL HIGH PROTEIN PO LIQD
1000.0000 mL | ORAL | Status: DC
  Filled 2015-01-29 (×2): qty 1000

## 2015-01-29 MED ORDER — LABETALOL HCL 300 MG PO TABS
300.0000 mg | ORAL_TABLET | Freq: Two times a day (BID) | ORAL | Status: DC
  Administered 2015-01-29: 300 mg via ORAL
  Filled 2015-01-29 (×3): qty 1

## 2015-01-29 MED ORDER — LABETALOL HCL 200 MG PO TABS
200.0000 mg | ORAL_TABLET | Freq: Once | ORAL | Status: AC
  Administered 2015-01-29: 200 mg via ORAL
  Filled 2015-01-29: qty 1

## 2015-01-29 MED ORDER — METOPROLOL TARTRATE 1 MG/ML IV SOLN
2.5000 mg | INTRAVENOUS | Status: DC | PRN
  Administered 2015-01-29 – 2015-01-30 (×3): 5 mg via INTRAVENOUS
  Administered 2015-01-30: 2.5 mg via INTRAVENOUS
  Administered 2015-01-31 – 2015-02-01 (×7): 5 mg via INTRAVENOUS
  Filled 2015-01-29 (×15): qty 5

## 2015-01-29 MED ORDER — PRO-STAT SUGAR FREE PO LIQD
30.0000 mL | Freq: Two times a day (BID) | ORAL | Status: DC
  Administered 2015-01-29 – 2015-01-30 (×2): 30 mL
  Filled 2015-01-29 (×3): qty 30

## 2015-01-29 MED ORDER — VITAL AF 1.2 CAL PO LIQD
1000.0000 mL | ORAL | Status: DC
  Administered 2015-01-29: 1000 mL
  Filled 2015-01-29 (×3): qty 1000

## 2015-01-29 NOTE — Progress Notes (Signed)
CVP line not changed per RN. She wants to see if it is still needed.

## 2015-01-29 NOTE — Progress Notes (Signed)
NUTRITION FOLLOW UP  Intervention:   Initiate TF via OGT with Vital 1.2 AF at 25 ml/h and Prostat 30 ml BID on day 1; on day 2, increase to goal rate of 45 ml/h (1080 ml per day) to provide 1496 kcals, 111 gm protein, 876 ml free water daily.  Nutrition Dx:   Inadequate oral intake related to inability to eat as evidenced by NPO status; ongoing  Goal:   Intake to meet >90% of estimated nutrition needs.  Monitor:   TF initiation/tolerance/adequacy, weight trend, labs, vent status.  Assessment:   Patient admitted with weakness and recent weight loss. History of lupus, CKD, DM. Patient with frequent diarrhea within 45 minutes after eating since starting Cellcept in November. She has lost 20 lbs in the past 3-4 months.  RD received consult for TF initiation and management.  Patient remains intubated on ventilator support. OGT in place.  MV: 6.5 L/min Temp (24hrs), Avg:97.6 F (36.4 C), Min:95.6 F (35.3 C), Max:98.7 F (37.1 C)  Propofol: off  Spoke with RN who confirms that pt was experiencing high residuals over the weekend with VHP regimen. Per chart review, pt with 600-700 ml residuals on 01/25/15. Pt was experiencing loose stools and flexiseal was initiated on 01/26/14 (noted 50-350 ml flexiseal output). TF was subsequently stopped on 01/27/15 and OGT was placed for low intermittent suction. Per RN, suction will be d/c once TF is hung. RN also confirm,ed that propofol is off.  Per MD, mental status and agitation is the main barrier to extubation.  Pt remains on CRRT, which was initiated on 01/22/15.  Labs reviewed. Creat: 0.61, Mg: 2.6, Glucose: 128. CBGS: 93-113.   Height: Ht Readings from Last 1 Encounters:  12/07/14 5\' 5"  (1.651 m)    Weight Status:   Wt Readings from Last 1 Encounters:  01/29/15 115 lb 15.4 oz (52.6 kg)   01/24/15 134 lb 14.7 oz (61.2 kg)   01/20/15 132 lb 11.2 oz (60.192 kg)   01/19/15 124 lb 11.2 oz (56.564 kg)                Re-estimated needs:  Kcal: 1280.1 Protein: 110-120 grams Fluid: >1.3 L  Skin: stage II pressure ulcer on buttocks  Diet Order: Diet NPO time specified   Intake/Output Summary (Last 24 hours) at 01/29/15 1129 Last data filed at 01/29/15 1000  Gross per 24 hour  Intake 1804.4 ml  Output   2770 ml  Net -965.6 ml    Last BM: 01/29/15   Labs:   Recent Labs Lab 01/27/15 1515 01/28/15 0345 01/28/15 1600 01/29/15 0506  NA 136 137 137 137  K 3.6 3.7 3.7 3.6  CL 100 100 98 96  CO2 25 27 24 26   BUN 14 15 15 12   CREATININE 1.71* 1.61* 1.69* 1.61*  CALCIUM 9.1 9.6 9.8 10.5  MG 2.4 2.3  --  2.6*  PHOS 2.4  2.4 1.8* 6.1* 4.0  GLUCOSE 127* 110* 125* 128*    CBG (last 3)   Recent Labs  01/28/15 2346 01/29/15 0409 01/29/15 0758  GLUCAP 104* 113* 96    Scheduled Meds: . antiseptic oral rinse  7 mL Mouth Rinse QID  . chlorhexidine  15 mL Mouth Rinse BID  . darbepoetin (ARANESP) injection - NON-DIALYSIS  150 mcg Subcutaneous Q Fri-1800  . famotidine (PEPCID) IV  20 mg Intravenous Q24H  . feeding supplement (PRO-STAT SUGAR FREE 64)  60 mL Per Tube BID  . feeding supplement (VITAL HIGH PROTEIN)  1,000 mL Per Tube Q24H  . folic acid  1 mg Oral Daily  . labetalol  200 mg Oral Once  . labetalol  300 mg Oral BID  . multivitamin  1 tablet Oral QHS  . QUEtiapine  25 mg Oral BID  . sodium chloride  3 mL Intravenous Q12H  . thiamine  100 mg Oral Daily    Continuous Infusions: . dextrose 5 % and 0.9% NaCl 10 mL/hr at 01/26/15 1200  . fentaNYL infusion INTRAVENOUS Stopped (01/29/15 0900)  . heparin 10,000 units/ 20 mL infusion syringe 1,350 Units/hr (01/29/15 0724)  . dialysis replacement fluid (prismasate) 300 mL/hr at 01/28/15 1935  . dialysis replacement fluid (prismasate) 200 mL/hr at 01/29/15 0249  . dialysate (PRISMASATE) 1,000 mL/hr at 01/29/15 0833  . propofol Stopped (01/29/15 0900)    Jordani Nunn A. Jimmye Norman, RD, LDN, CDE Pager: [phone] After hours Pager:  [phone]

## 2015-01-29 NOTE — Progress Notes (Signed)
PULMONARY / CRITICAL CARE MEDICINE   Name: [name] [name] MRN: [id number] DOB: [birth year]    ADMISSION DATE:  01/18/2015 CONSULTATION DATE:  3/20  REFERRING MD :  Eliseo Squires   CHIEF COMPLAINT:  Tachycardia, resp failure   INITIAL PRESENTATION:  54 y/o female with hx lupus (on cellcept, plaquenil) and associated CKD, DM, HTN initially presented 3/17 with vague c/o SOB, diarrhea, malaise, weakness, cough.  She was admitted by [facility] and r/o for flu.  Was undergoing further w/u for diarrhea, mildly elevated troponin and anemia.  Overnight 3/19 developed intermittent SVT, then persistent tachycardia with HR 150's and worsening SOB and hypoxia requiring NRB.  PCCM consulted 3/20.  She required ETT/MV 3/20. CXR with B infiltrates. Initiated CVVHD on 3/21 pm.   STUDIES:  CXR 3/20>>> neg  2D echo 3/19>>> WP80-99%, grade 1 diastolic dysfunction, mod AR, mod MR, mod TR. No pericardial effusion.  LE doppler US 3/20 >> negative   SIGNIFICANT EVENTS: 3/21  Adenosine challenge >> appears to be sinus rhythm with some non-conducted p-waves 3/23  Tolerated CVVHD, I/O even last 24 h, tachycardia improved, fent + propofol 3/24  High residuals overnight, on max dose fentanyl.  Rx'd with Reglan  3/26  Continues to have agitation with WUA / SBT, changed back to propofol/fentanyl   SUBJECTIVE:  Remains tachy - intermittent agitation, bites down on ET- needing bite block  VITAL SIGNS: Temp:  [95.6 F (35.3 C)-98.4 F (36.9 C)] 95.6 F (35.3 C) (03/28 0800) Pulse Rate:  [110-167] 155 (03/28 1000) Resp:  [12-26] 16 (03/28 1000) BP: (87-216)/(59-101) 122/69 mmHg (03/28 1000) SpO2:  [100 %] 100 % (03/28 1000) FiO2 (%):  [40 %] 40 % (03/28 0800) Weight:  [52.6 kg (115 lb 15.4 oz)] 52.6 kg (115 lb 15.4 oz) (03/28 0500)   VENTILATOR SETTINGS: Vent Mode:  [-] PRVC FiO2 (%):  [40 %] 40 % Set Rate:  [16 bmp-20 bmp] 16 bmp Vt Set:  [480 mL] 480 mL PEEP:  [5 cmH20] 5 cmH20 Plateau Pressure:  [19 cmH20-20 cmH20] 20  cmH20   INTAKE / OUTPUT:  Intake/Output Summary (Last 24 hours) at 01/29/15 1104 Last data filed at 01/29/15 1000  Gross per 24 hour  Intake 1824.4 ml  Output   2770 ml  Net -945.6 ml    PHYSICAL EXAMINATION: General:  Chronically ill appearing female, intubated and sedated Neuro:  Sedate,  intermittent follow commands -not today, int agitation HEENT:  MM dry, ETT in place, clear oral secretions Cardiovascular:  s1s2 tachy  Lungs:  Clear bilaterally   Abdomen:  round, mildly distended, soft, BSx4 active   Musculoskeletal:  Warm and dry, no edema  LABS:  CBC  Recent Labs Lab 01/27/15 0400 01/28/15 0345 01/29/15 0506  WBC 8.6 9.9 9.6  HGB 9.2* 9.6* 10.1*  HCT 29.0* 30.3* 31.9*  PLT 226 278 365   Coag's  Recent Labs Lab 01/27/15 0400 01/28/15 0345 01/29/15 0506  APTT 148* 144* >200*    BMET  Recent Labs Lab 01/28/15 0345 01/28/15 1600 01/29/15 0506  NA 137 137 137  K 3.7 3.7 3.6  CL 100 98 96  CO2 27 24 26   BUN 15 15 12   CREATININE 1.61* 1.69* 1.61*  GLUCOSE 110* 125* 128*   Electrolytes  Recent Labs Lab 01/27/15 1515 01/28/15 0345 01/28/15 1600 01/29/15 0506  CALCIUM 9.1 9.6 9.8 10.5  MG 2.4 2.3  --  2.6*  PHOS 2.4  2.4 1.8* 6.1* 4.0   Sepsis Markers  Recent Labs  Lab 01/26/15 0355 01/27/15 0445 01/28/15 0345  PROCALCITON 1.09 0.35 0.26     ABG  Recent Labs Lab 01/23/15 0312  PHART 7.327*  PCO2ART 32.9*  PO2ART 165.0*   Liver Enzymes  Recent Labs Lab 01/28/15 0345 01/28/15 1600 01/29/15 0506  ALBUMIN 2.5* 2.6* 2.6*     Cardiac Enzymes No results for input(s): TROPONINI, PROBNP in the last 168 hours. Glucose  Recent Labs Lab 01/28/15 1233 01/28/15 1557 01/28/15 1941 01/28/15 2346 01/29/15 0409 01/29/15 0758  GLUCAP 149* 120* 119* 104* 113* 96    Imaging Ct Head Wo Contrast  01/28/2015   CLINICAL DATA:  Acute encephalopathy, history of brainstem hemorrhage  EXAM: CT HEAD WITHOUT CONTRAST  TECHNIQUE:  Contiguous axial images were obtained from the base of the skull through the vertex without intravenous contrast.  COMPARISON:  10/11/2014 brain MRI, most recent head CT 10/04/2014  FINDINGS: Endotracheal tube partly visualized. Mild pansinusitis with mucoperiosteal thickening noted. Orbits are unremarkable. No skull fracture. No acute hemorrhage, infarct, or mass lesion is identified. Minimal periventricular white matter hypodensity likely indicating small vessel ischemic change is reidentified.  IMPRESSION: No acute intracranial abnormality.   Electronically Signed   By: Conchita Paris M.D.   On: 01/28/2015 11:15   Dg Chest Port 1 View  01/28/2015   CLINICAL DATA:  Acute respiratory failure  EXAM: PORTABLE CHEST - 1 VIEW  COMPARISON:  01/27/2015  FINDINGS: Right IJ central line tip terminates over the superior SVC. Left IJ central line tip terminates over the mid SVC. Heart size normal. Nasogastric tube tip terminates below the level of the diaphragms but is not included in the field of view. The lungs are clear. No pleural effusion or pneumothorax.  IMPRESSION: No acute cardiopulmonary process.   Electronically Signed   By: Conchita Paris M.D.   On: 01/28/2015 08:15    ASSESSMENT / PLAN:  PULMONARY Acute hypoxic respiratory failure - unknown etiology but in setting B infiltrates.  -treated for HCAP, PE (d/c'd heparin 3/23). Consider acute pulmonary edema in setting HTN and renal failure-  infiltrates have completely cleared. Most likely cause is cardiogenic edema.  Mental status is the issue with extubation at this point.   P:   Daily WUA / SBT with goal extubation once agitation/HR controlled    CARDIOVASCULAR SVT/sinus tachycardia - HR was 150 at time of her acute decompensation, Adenosine given, appeared to be sinus with some non-conducted p-waves. Presumed reactive to critical illness Hypotension Mildly elevated troponin peak 0.74 dCHF - new.  EF 40-45% with MOD aortic, tricuspid and  mitral regurg.  ??PE - BLE venous dopplers prelim negative; doubt PE (heparin gtt stopped 3/23) P:  Dc Hydralazine PRN  Increase home labetalol 300 mg BID (reduced home dose, hx of non-compliance) Hold home clonidine Use lopressor prn for HR 130 & above  RENAL Acute on chronic renal failure, presume lupus nephritis.  Hyponatremia  Metabolic acidosis P:   Appreciate renal service's assistance Resumed  CVVHD - even balance, she is not expected to recover her renal fxn  GASTROINTESTINAL Diarrhea - initial resolved.  New with reglan 3/26, flexi seal placed.  Abd pain - neg abd xray  High Gastric Residuals Protein Calorie Malnutrition  P:   Resume TF @ 20/h dc reglan  Flexi-seal 3/26 Pepcid    HEMATOLOGIC Leukocytosis  Anemia - s/p 1 unit PRBC 3/22 P:  Cont iron    INFECTIOUS Fever - resolved  Immunocompromised Host Diarrhea - resolved Unclear etiology of infection, consider PNA  in immunocompromised host but CXR has quickly cleared.  ??GI given diarrhea v viral v other source (ie lupus pneumonitis).  Immunocompromised on rx for lupus.  P:   BCx2 3/20 >>>neg UC 3/20 >>> negative  Vanc 3/20 (empiric) >>> 3/22 Zosyn 3/20 (empiric) >>>3/28  C diff cancelled as no further diarrhea Dc Empiric broad spectrum abx   Hold cellcept, plaquenil temporarily - will plan to restart when stabilized  ENDOCRINE No active issue    P:   Monitor glucose on chem   NEUROLOGIC Acute encephalopathy Agitation / Delirium - r/o intracranial process  Head Ct neg 3/27 P:   RASS goal: 0 to -1 Fentanyl gtt, allow for lighter sedation, goal to minimize, use propofol gtt & hopeful extubation soon   FAMILY  - Updates: Mother & sister updated 3/28   Summary - mental status main issue here - do we have to consider lupus cerebritis?  The patient is critically ill with multiple organ systems failure and requires high complexity decision making for assessment and support, frequent evaluation  and titration of therapies, application of advanced monitoring technologies and extensive interpretation of multiple databases. Critical Care Time devoted to patient care services described in this note independent of APP time is 35 minutes.    Kara Mead MD. Shade Flood. [facility] Pager [phone] If no response call [phone]    01/29/2015, 11:04 AM

## 2015-01-29 NOTE — Progress Notes (Signed)
Attempted to perform SBT on patient this AM.  Patient had no patient effort.  Will attempt again throughout the day.  Will continue to monitor.

## 2015-01-29 NOTE — Consult Note (Addendum)
Reason for Consult:Altered mental status Referring Physician: Elsworth Soho  CC: Altered mental status  HPI: [name] [name] is an 54 y.o. female with a history of lupus on immunosuppressant therapy.admitted 01/18/2015 with SOB and cough.  Developed intermittent SVT on 3/20, then persistent tachycardia with worsening SOB.  Required intubation and found to have bilateral infiltrates on CXR.  Patient has remained intubated.  Noted to have agitation that has precluded weaning and Seroquel added.  Precedex and Versed discontinued on 3/26.  Fentanyl and Diprovan discontinued as of 0900 today.  Patient has remained altered.    Past Medical History  Diagnosis Date  . Hypertension   . Lupus   . CKD (chronic kidney disease)     due to lupus/Dr. [name]  . Diabetes mellitus   . Stenosis of cervical spine region     with HNP at C5/6, C6/7  . Metatarsal bone fracture right    4th  . 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 Arthritis     RA  . Anemia   . Stroke 11/2014    left sided weakness, dysphagia  . Pain in joints   . Paresthesias   . Hyperlipidemia   . Colitis     ? per hospital notes 11/2014  . Lower GI bleed   . Hyponatremia   . Metabolic acidosis   . Hyperplastic colon polyp   . Hemorrhoids   . Blood transfusion without reported diagnosis     Past Surgical History  Procedure Laterality Date  . Breast biopsy    . Tubal ligation      Family History  Problem Relation Age of Onset  . Hypertension    . Lupus    . Rheum arthritis    . Hypertension Mother   . Diabetes Mother   . Hypertension Sister   . Diabetes Father   . Hypertension Maternal Grandmother     Social History:  reports that she has quit smoking. Her smoking use included Cigarettes. She has a 15 pack-year smoking history. She has never used smokeless tobacco. She reports that she does not drink alcohol or use illicit drugs.  Allergies  Allergen Reactions  . Sulfa  Antibiotics Itching    Medications:  I have reviewed the patient's current medications. Prior to Admission:  Prescriptions prior to admission  Medication Sig Dispense Refill Last Dose  . acetaminophen (TYLENOL) 325 MG tablet Take 2 tablets (650 mg total) by mouth every 4 (four) hours as needed for mild pain.   01/17/2015  . ALPRAZolam (XANAX) 0.5 MG tablet Take 1 tablet (0.5 mg total) by mouth 2 (two) times daily as needed for anxiety. 15 tablet 0 01/18/2015 at Unknown time  . gabapentin (NEURONTIN) 100 MG capsule Take 1 capsule (100 mg total) by mouth at bedtime. (Patient taking differently: Take 100 mg by mouth 3 (three) times daily. ) 30 capsule 0 01/18/2015 at Unknown time  . labetalol (NORMODYNE) 300 MG tablet Take 2 tablets (600 mg total) by mouth 2 (two) times daily. 120 tablet 0 01/18/2015 at 1530  . mycophenolate (CELLCEPT) 250 MG capsule Take 4 capsules (1,000 mg total) by mouth 2 (two) times daily. 320 capsule 1 01/18/2015 at Unknown time  . senna-docusate (SENOKOT-S) 8.6-50 MG per tablet Take 1 tablet by mouth 2 (two) times daily. (Patient taking differently: Take 1 tablet by mouth 2 (two) times daily as needed. ) 60 tablet 1 Past Month  at Unknown time  . sodium bicarbonate 650 MG tablet Take 2 tablets (1,300 mg total) by mouth 2 (two) times daily. 120 tablet 1 01/18/2015  . Vitamin D, Ergocalciferol, (DRISDOL) 50000 UNITS CAPS capsule Take 1 capsule (50,000 Units total) by mouth every Wednesday. 4 capsule 1 2 weeks ago  . atorvastatin (LIPITOR) 20 MG tablet Take 1 tablet (20 mg total) by mouth daily at 6 PM. 30 tablet 1 01/17/2015  . cloNIDine (CATAPRES) 0.3 MG tablet Take 1 tablet (0.3 mg total) by mouth 3 (three) times daily. 90 tablet 1 01/18/2015  . clotrimazole (MYCELEX) 10 MG troche Take 1 tablet (10 mg total) by mouth 5 (five) times daily. 30 tablet 0 01/18/2015  . cyclobenzaprine (FLEXERIL) 5 MG tablet Take 5 mg by mouth 3 (three) times daily as needed for muscle spasms.    01/18/2015   . diclofenac sodium (VOLTAREN) 1 % GEL Apply 2 g topically 4 (four) times daily. To forefoot 4 Tube 0 01/18/2015  . ferrous sulfate 325 (65 FE) MG tablet Take 1 tablet (325 mg total) by mouth daily with breakfast. 30 tablet 1 01/18/2015  . furosemide (LASIX) 80 MG tablet Take 80 mg by mouth 2 (two) times daily.  0 01/18/2015  . hydrALAZINE (APRESOLINE) 25 MG tablet Take 25 mg by mouth every 8 (eight) hours.  0 01/18/2015  . hydroxychloroquine (PLAQUENIL) 200 MG tablet Take 2 tablets (400 mg total) by mouth daily. 30 tablet 1 01/18/2015  . lidocaine (XYLOCAINE) 2 % solution    01/18/2015  . Menthol-Methyl Salicylate (MUSCLE RUB) 10-15 % CREA Apply 1 application topically 3 (three) times daily before meals. 85 g 0 01/18/2015  . multivitamin (RENA-VIT) TABS tablet Take 1 tablet by mouth daily.   01/18/2015  . nystatin-triamcinolone (MYCOLOG II) cream Apply topically 2 (two) times daily.  0 01/18/2015  . nystatin-triamcinolone ointment (MYCOLOG) Apply 1 application topically 2 (two) times daily. 30 g 0 01/18/2015  . pantoprazole (PROTONIX) 40 MG tablet Take 1 tablet (40 mg total) by mouth daily. 30 tablet 11 01/18/2015  . pramoxine-hydrocortisone (PROCTOCREAM-HC) 1-1 % rectal cream Place 1 application rectally 2 (two) times daily. (Patient taking differently: Place 1 application rectally 2 (two) times daily as needed for hemorrhoids or itching. ) 30 g 1 01/12/2015   Scheduled: . antiseptic oral rinse  7 mL Mouth Rinse QID  . chlorhexidine  15 mL Mouth Rinse BID  . darbepoetin (ARANESP) injection - NON-DIALYSIS  150 mcg Subcutaneous Q Fri-1800  . doxercalciferol  1 mcg Intravenous Q M,W,F-1800  . famotidine (PEPCID) IV  20 mg Intravenous Q24H  . feeding supplement (PRO-STAT SUGAR FREE 64)  30 mL Per Tube BID  . folic acid  1 mg Oral Daily  . labetalol  300 mg Oral BID  . multivitamin  1 tablet Oral QHS  . QUEtiapine  25 mg Oral BID  . thiamine  100 mg Oral Daily    ROS: Unable to obtain  Physical  Examination: Blood pressure 97/61, pulse 144, temperature 100.3 F (37.9 C), temperature source Oral, resp. rate 21, weight 52.6 kg (115 lb 15.4 oz), last menstrual period 07/18/2012, SpO2 100 %.  HEENT-  Normocephalic, no lesions, without obvious abnormality.  Normal external eye and conjunctiva.  Normal TM's bilaterally.  Normal auditory canals and external ears. Normal external nose, mucus membranes and septum.  Normal pharynx. Cardiovascular- S1, S2 normal, pulses palpable throughout   Lungs- chest clear, no wheezing, rales, normal symmetric air entry Abdomen- soft, non-tender; bowel sounds  normal; no masses,  no organomegaly Extremities- no edema Lymph-no adenopathy palpable Musculoskeletal-no joint tenderness, deformity or swelling Skin-warm and dry, no hyperpigmentation, vitiligo, or suspicious lesions  Neurological Examination Mental Status: Patient does not respond to verbal stimuli.  Localizes to pain with LUE during deep sternal rub.  Follows commands to wiggle toes bilaterally.  No verbalizations noted.  Intubated. Cranial Nerves: II: patient appears to respond to confrontation bilaterally, pupils right 3 mm, left 3 mm,and reactive bilaterally III,IV,VI: doll's response present bilaterally.  V,VII: corneal reflex present bilaterally  VIII: patient does not respond to verbal stimuli IX,X: gag reflex unable to be tested, XI: trapezius strength unable to test bilaterally XII: tongue strength unable to test Motor: Patient localizes with the LUE but not with the RUE.  No spontaneous movement noted of the RUE.  Wiggles toes on both feet and withdrawal noted bilaterally. Sensory: Grimaces to noxious stimuli in all extremities. Deep Tendon Reflexes:  3+ throughout with 2 beats clonus in the RLE and sustained clonus in the LLE Plantars: downgoing bilaterally Cerebellar: Unable to perform   Laboratory Studies:   Basic Metabolic Panel:  Recent Labs Lab 01/26/15 1510  01/27/15 0400  01/27/15 1515 01/28/15 0345 01/28/15 1600 01/29/15 0506 01/29/15 1520  NA 138  --   < > 136 137 137 137 137  K 3.7  --   < > 3.6 3.7 3.7 3.6 4.5  CL 105  --   < > 100 100 98 96 100  CO2 23  --   < > 25 27 24 26 24   GLUCOSE 121*  --   < > 127* 110* 125* 128* 144*  BUN 21  --   < > 14 15 15 12 14   CREATININE 2.28*  --   < > 1.71* 1.61* 1.69* 1.61* 1.83*  CALCIUM 9.0  --   < > 9.1 9.6 9.8 10.5 9.9  MG 2.1 2.2  --  2.4 2.3  --  2.6*  --   PHOS 3.0 2.2*  < > 2.4  2.4 1.8* 6.1* 4.0 4.7*  < > = values in this interval not displayed.  Liver Function Tests:  Recent Labs Lab 01/27/15 1515 01/28/15 0345 01/28/15 1600 01/29/15 0506 01/29/15 1520  ALBUMIN 2.4* 2.5* 2.6* 2.6* 2.7*   No results for input(s): LIPASE, AMYLASE in the last 168 hours.  Recent Labs Lab 01/28/15 0950  AMMONIA 21    CBC:  Recent Labs Lab 01/25/15 0400 01/26/15 0355 01/27/15 0400 01/28/15 0345 01/29/15 0506  WBC 6.8 7.8 8.6 9.9 9.6  HGB 9.2* 9.1* 9.2* 9.6* 10.1*  HCT 27.5* 27.8* 29.0* 30.3* 31.9*  MCV 82.8 84.2 86.1 86.8 88.4  PLT 190 215 226 278 365    Cardiac Enzymes: No results for input(s): CKTOTAL, CKMB, CKMBINDEX, TROPONINI in the last 168 hours.  BNP: Invalid input(s): POCBNP  CBG:  Recent Labs Lab 01/28/15 2346 01/29/15 0409 01/29/15 0758 01/29/15 1145 01/29/15 1519  GLUCAP 104* 113* 24 148* 133*    Microbiology: Results for orders placed or performed during the hospital encounter of 01/18/15  MRSA PCR Screening     Status: None   Collection Time: 01/21/15 10:04 AM  Result Value Ref Range Status   MRSA by PCR NEGATIVE NEGATIVE Final    Comment:        The GeneXpert MRSA Assay (FDA approved for NASAL specimens only), is one component of a comprehensive MRSA colonization surveillance program. It is not intended to diagnose MRSA infection nor to guide  or monitor treatment for MRSA infections.   Culture, blood (routine x 2)     Status: None    Collection Time: 01/21/15 11:30 AM  Result Value Ref Range Status   Specimen Description BLOOD LEFT ASSIST CONTROL  Final   Special Requests BOTTLES DRAWN AEROBIC ONLY 8CC  Final   Culture   Final    NO GROWTH 5 DAYS Performed at [facility]    Report Status 01/27/2015 FINAL  Final  Culture, Urine     Status: None   Collection Time: 01/21/15  4:24 PM  Result Value Ref Range Status   Specimen Description URINE, CATHETERIZED  Final   Special Requests Immunocompromised  Final   Colony Count NO GROWTH Performed at [facility]   Final   Culture NO GROWTH Performed at [facility]   Final   Report Status 01/22/2015 FINAL  Final  Clostridium Difficile by PCR     Status: None   Collection Time: 01/27/15  9:38 AM  Result Value Ref Range Status   C difficile by pcr NEGATIVE NEGATIVE Final    Coagulation Studies: No results for input(s): LABPROT, INR in the last 72 hours.  Urinalysis: No results for input(s): COLORURINE, LABSPEC, PHURINE, GLUCOSEU, HGBUR, BILIRUBINUR, KETONESUR, PROTEINUR, UROBILINOGEN, NITRITE, LEUKOCYTESUR in the last 168 hours.  Invalid input(s): APPERANCEUR  Lipid Panel:     Component Value Date/Time   CHOL 78 10/12/2014 1246   TRIG 234* 01/27/2015 1541   HDL 12* 10/12/2014 1246   CHOLHDL 6.5 10/12/2014 1246   VLDL 18 10/12/2014 1246   LDLCALC 48 10/12/2014 1246    HgbA1C:  Lab Results  Component Value Date   HGBA1C 6.2* 01/18/2015    Urine Drug Screen:     Component Value Date/Time   LABOPIA NONE DETECTED 09/28/2014 2239   COCAINSCRNUR NONE DETECTED 09/28/2014 2239   LABBENZ NONE DETECTED 09/28/2014 2239   AMPHETMU NONE DETECTED 09/28/2014 2239   THCU NONE DETECTED 09/28/2014 2239   LABBARB NONE DETECTED 09/28/2014 2239    Alcohol Level: No results for input(s): ETH in the last 168 hours.   Imaging: Ct Head Wo Contrast  01/28/2015   CLINICAL DATA:  Acute encephalopathy, history of brainstem hemorrhage  EXAM: CT HEAD  WITHOUT CONTRAST  TECHNIQUE: Contiguous axial images were obtained from the base of the skull through the vertex without intravenous contrast.  COMPARISON:  10/11/2014 brain MRI, most recent head CT 10/04/2014  FINDINGS: Endotracheal tube partly visualized. Mild pansinusitis with mucoperiosteal thickening noted. Orbits are unremarkable. No skull fracture. No acute hemorrhage, infarct, or mass lesion is identified. Minimal periventricular white matter hypodensity likely indicating small vessel ischemic change is reidentified.  IMPRESSION: No acute intracranial abnormality.   Electronically Signed   By: Conchita Paris M.D.   On: 01/28/2015 11:15   Dg Chest Port 1 View  01/29/2015   CLINICAL DATA:  Acute respiratory failure  EXAM: PORTABLE CHEST - 1 VIEW  COMPARISON:  01/28/2015  FINDINGS: Endotracheal tube tip just below the clavicular heads. The orogastric tube reaches the stomach. Bilateral IJ catheters are in stable and unremarkable position.  Normal heart size and aortic contours. The lungs remain clear. A trace left pleural effusion has developed.  IMPRESSION: 1. Stable positioning of tubes and lines. 2. Trace left pleural effusion.   Electronically Signed   By: Monte Fantasia M.D.   On: 01/29/2015 07:29   Dg Chest Port 1 View  01/28/2015   CLINICAL DATA:  Acute respiratory failure  EXAM: PORTABLE CHEST - 1 VIEW  COMPARISON:  01/27/2015  FINDINGS: Right IJ central line tip terminates over the superior SVC. Left IJ central line tip terminates over the mid SVC. Heart size normal. Nasogastric tube tip terminates below the level of the diaphragms but is not included in the field of view. The lungs are clear. No pleural effusion or pneumothorax.  IMPRESSION: No acute cardiopulmonary process.   Electronically Signed   By: Conchita Paris M.D.   On: 01/28/2015 08:15     Assessment/Plan: 54 year old female with lupus.  Admitted with SOB and bilateral infiltrates.  CXR has now cleared but patient has not been  able to be weaned.  Is on Seroquel for agitation.  Sedatives stopped today.  Renal function impaired.  Unclear if there is any hepatic dysfunction as well that may be delaying medication clearance also.  Lupus cerebritis is on the differential.  Patient does have some signs of focality on neurological examination as well.  Head CT personally reviewed and shows no acute changes.  Complements reveal an elevated serum C4 and normal C3.  Anti-dna antibody pending.  Despite being on Zosyn with a temp as well (100.3).  Further testing required for full evaluation.    Recommendations: 1.  Hepatic function panel 2.  EEG 3.  MRI of the brain without contrast tomorrow if no further improvement noted 4.  If above not diagnostic patient will require an LP as well.  Patient on heparin currently though and will require this to be discontinued prior to procedure.    Alexis Goodell, MD [facility] [phone] 01/29/2015, 4:25 PM

## 2015-01-29 NOTE — Progress Notes (Signed)
CRITICAL VALUE ALERT  Critical value received:  aptt >200  Date of notification:  01/29/15  Time of notification:  0650  Critical value read back:Yes.    Nurse who received alert:  km  Responding MD:  elink  Time MD responded:  [phone]

## 2015-01-29 NOTE — Progress Notes (Signed)
[facility] ROUNDING NOTE   Subjective:   Sedation is off - pt staring into space - not following any commands Head CT unrevealing as to etiology of her AMS ? Raised of lupus cerebritis Continues with tachycardia HR 160's CRRT off right now - changing filter (started 3/21) Remains on vent with discussion of possible trach   Objective:  Vital signs in last 24 hours:  Temp:  [95.6 F (35.3 C)-98.4 F (36.9 C)] 95.6 F (35.3 C) (03/28 0800) Pulse Rate:  [110-167] 155 (03/28 1000) Resp:  [12-26] 16 (03/28 1000) BP: (89-216)/(59-101) 122/69 mmHg (03/28 1000) SpO2:  [100 %] 100 % (03/28 1000) FiO2 (%):  [40 %] 40 % (03/28 0800) Weight:  [52.6 kg (115 lb 15.4 oz)] 52.6 kg (115 lb 15.4 oz) (03/28 0500)  Weight change: -1.4 kg (-3 lb 1.4 oz) Filed Weights   01/27/15 0451 01/28/15 0500 01/29/15 0500  Weight: 56.7 kg (125 lb) 54 kg (119 lb 0.8 oz) 52.6 kg (115 lb 15.4 oz)   Physical Examination BP 122/69 mmHg  Pulse 155  Temp(Src) 95.6 F (35.3 C) (Rectal)  Resp 16  Wt 52.6 kg (115 lb 15.4 oz)  SpO2 100%  LMP 07/18/2012 Unresponsive but eyes open VS as noted Tachycardic at 160 regular narrow complex Lungs grossly clear Abd soft and no focal abd tenderness No edema of LE's   Recent Labs Lab 01/26/15 1510 01/27/15 0400 01/27/15 0445 01/27/15 1515 01/28/15 0345 01/28/15 1600 01/29/15 0506  NA 138  --  138 136 137 137 137  K 3.7  --  3.7 3.6 3.7 3.7 3.6  CL 105  --  100 100 100 98 96  CO2 23  --  25 25 27 24 26   GLUCOSE 121*  --  152* 127* 110* 125* 128*  BUN 21  --  18 14 15 15 12   CREATININE 2.28*  --  1.76* 1.71* 1.61* 1.69* 1.61*  CALCIUM 9.0  --  9.4 9.1 9.6 9.8 10.5  MG 2.1 2.2  --  2.4 2.3  --  2.6*  PHOS 3.0 2.2* 2.2* 2.4  2.4 1.8* 6.1* 4.0     Recent Labs Lab 01/27/15 0445 01/27/15 1515 01/28/15 0345 01/28/15 1600 01/29/15 0506  ALBUMIN 2.2* 2.4* 2.5* 2.6* 2.6*   No results for input(s): LIPASE, AMYLASE in the last 168  hours.  Recent Labs Lab 01/28/15 0950  AMMONIA 21    CBC:  Recent Labs Lab 01/25/15 0400 01/26/15 0355 01/27/15 0400 01/28/15 0345 01/29/15 0506  WBC 6.8 7.8 8.6 9.9 9.6  HGB 9.2* 9.1* 9.2* 9.6* 10.1*  HCT 27.5* 27.8* 29.0* 30.3* 31.9*  MCV 82.8 84.2 86.1 86.8 88.4  PLT 190 215 226 278 365    CBG:  Recent Labs Lab 01/28/15 1557 01/28/15 1941 01/28/15 2346 01/29/15 0409 01/29/15 0758  GLUCAP 120* 119* 104* 113* 96    Imaging: Ct Head Wo Contrast  01/28/2015   CLINICAL DATA:  Acute encephalopathy, history of brainstem hemorrhage  EXAM: CT HEAD WITHOUT CONTRAST  TECHNIQUE: Contiguous axial images were obtained from the base of the skull through the vertex without intravenous contrast.  COMPARISON:  10/11/2014 brain MRI, most recent head CT 10/04/2014  FINDINGS: Endotracheal tube partly visualized. Mild pansinusitis with mucoperiosteal thickening noted. Orbits are unremarkable. No skull fracture. No acute hemorrhage, infarct, or mass lesion is identified. Minimal periventricular white matter hypodensity likely indicating small vessel ischemic change is reidentified.  IMPRESSION: No acute intracranial abnormality.   Electronically Signed  By: Conchita Paris M.D.   On: 01/28/2015 11:15   Dg Chest Port 1 View  01/29/2015   CLINICAL DATA:  Acute respiratory failure  EXAM: PORTABLE CHEST - 1 VIEW  COMPARISON:  01/28/2015  FINDINGS: Endotracheal tube tip just below the clavicular heads. The orogastric tube reaches the stomach. Bilateral IJ catheters are in stable and unremarkable position.  Normal heart size and aortic contours. The lungs remain clear. A trace left pleural effusion has developed.  IMPRESSION: 1. Stable positioning of tubes and lines. 2. Trace left pleural effusion.   Electronically Signed   By: Monte Fantasia M.D.   On: 01/29/2015 07:29   Dg Chest Port 1 View  01/28/2015   CLINICAL DATA:  Acute respiratory failure  EXAM: PORTABLE CHEST - 1 VIEW  COMPARISON:   01/27/2015  FINDINGS: Right IJ central line tip terminates over the superior SVC. Left IJ central line tip terminates over the mid SVC. Heart size normal. Nasogastric tube tip terminates below the level of the diaphragms but is not included in the field of view. The lungs are clear. No pleural effusion or pneumothorax.  IMPRESSION: No acute cardiopulmonary process.   Electronically Signed   By: Conchita Paris M.D.   On: 01/28/2015 08:15     Medications:   . dextrose 5 % and 0.9% NaCl 10 mL/hr at 01/26/15 1200  . fentaNYL infusion INTRAVENOUS Stopped (01/29/15 0900)  . heparin 10,000 units/ 20 mL infusion syringe 1,350 Units/hr (01/29/15 0724)  . dialysis replacement fluid (prismasate) 300 mL/hr at 01/28/15 1935  . dialysis replacement fluid (prismasate) 200 mL/hr at 01/29/15 0249  . dialysate (PRISMASATE) 1,000 mL/hr at 01/29/15 0833  . propofol Stopped (01/29/15 0900)   . antiseptic oral rinse  7 mL Mouth Rinse QID  . chlorhexidine  15 mL Mouth Rinse BID  . darbepoetin (ARANESP) injection - NON-DIALYSIS  150 mcg Subcutaneous Q Fri-1800  . famotidine (PEPCID) IV  20 mg Intravenous Q24H  . feeding supplement (PRO-STAT SUGAR FREE 64)  60 mL Per Tube BID  . feeding supplement (VITAL HIGH PROTEIN)  1,000 mL Per Tube Q24H  . folic acid  1 mg Oral Daily  . labetalol  200 mg Oral Once  . labetalol  300 mg Oral BID  . multivitamin  1 tablet Oral QHS  . QUEtiapine  25 mg Oral BID  . sodium chloride  3 mL Intravenous Q12H  . thiamine  100 mg Oral Daily   acetaminophen, fentaNYL, heparin, heparin, heparin, heparin, hydrocortisone-pramoxine, metoprolol, ondansetron **OR** ondansetron (ZOFRAN) IV  Assessment/ Plan:  1. AKI on CKD5/new ESRD - CRRT since 3/21 (temp cath placed 3/21) CRRT a little less hemodynamically stressful than intermittent HD and she remains extremely tachycardic. Would feel a little more comfortable making that transition if the tachycardia could be controlled.  No changes in  CRRT. Will need TDC placed for HD when felt by all to be stable enough. 2. Metabolic acidosis - resolved  3. Resp failure - No PE, infiltrates cleared ( favors pulmonary edema) 4. Fever/leukocytosis - empiric zosyn - completed course. No ATB's at present.  5. Sustained tachycardia - SVT vs sinus tachy. Trops mildly up. EF 40-45%.  6. HTN - home labetolol resumed and getting pr metoprolol 7. Diarrhea - resolved  8. SLE - Patient now off immunosuppressants, including steroids. Dr. [name] ordered ANA ds DNA and complements although I am not sure if this will help direct management. Does she need evaluation for lupus cerebritis??? She is already  committed to dialysis and renal function was failing as outpatient so renal biopsy not needed. 9. AMS - as above 10. Anemia - s/p transfusion 01/23/15. Fe studies normal on 01/05/15. 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 Add IV hectorol. (if we ever get her transitioned over to HD can then be given with HD) and watch for worsening hypercalcemia 12. Altered mental status  Seems to be the biggest issue at this point. CT negative. Sedation off right now and not responding. ??? Lupus cerebritis??? Lupus serologies pending although would not rule cerebritis in or out if positive.   Jamal Maes, MD [facility] [phone] Pager 01/29/2015, 11:59 AM

## 2015-01-30 ENCOUNTER — Encounter: Payer: Self-pay | Admitting: Vascular Surgery

## 2015-01-30 LAB — GLUCOSE, CAPILLARY
Glucose-Capillary: 117 mg/dL — ABNORMAL HIGH (ref 70–99)
Glucose-Capillary: 129 mg/dL — ABNORMAL HIGH (ref 70–99)
Glucose-Capillary: 137 mg/dL — ABNORMAL HIGH (ref 70–99)
Glucose-Capillary: 138 mg/dL — ABNORMAL HIGH (ref 70–99)
Glucose-Capillary: 138 mg/dL — ABNORMAL HIGH (ref 70–99)
Glucose-Capillary: 154 mg/dL — ABNORMAL HIGH (ref 70–99)
Glucose-Capillary: 165 mg/dL — ABNORMAL HIGH (ref 70–99)

## 2015-01-30 LAB — RENAL FUNCTION PANEL
ALBUMIN: 2.7 g/dL — AB (ref 3.5–5.2)
ALBUMIN: 2.7 g/dL — AB (ref 3.5–5.2)
ANION GAP: 13 (ref 5–15)
ANION GAP: 6 (ref 5–15)
BUN: 14 mg/dL (ref 6–23)
BUN: 22 mg/dL (ref 6–23)
CO2: 25 mmol/L (ref 19–32)
CO2: 28 mmol/L (ref 19–32)
CREATININE: 1.61 mg/dL — AB (ref 0.50–1.10)
CREATININE: 2.12 mg/dL — AB (ref 0.50–1.10)
Calcium: 10.4 mg/dL (ref 8.4–10.5)
Calcium: 10.3 mg/dL (ref 8.4–10.5)
Chloride: 99 mmol/L (ref 96–112)
Chloride: 103 mmol/L (ref 96–112)
GFR calc Af Amer: 30 mL/min — ABNORMAL LOW (ref >90)
GFR calc non Af Amer: 36 mL/min — ABNORMAL LOW (ref >90)
GFR, EST AFRICAN AMERICAN: 41 mL/min — AB (ref >90)
GFR, EST NON AFRICAN AMERICAN: 26 mL/min — AB (ref >90)
GLUCOSE: 131 mg/dL — AB (ref 70–99)
Glucose, Bld: 152 mg/dL — ABNORMAL HIGH (ref 70–99)
PHOSPHORUS: 3.8 mg/dL (ref 2.3–4.6)
POTASSIUM: 3.8 mmol/L (ref 3.5–5.1)
Phosphorus: 3.3 mg/dL (ref 2.3–4.6)
Potassium: 3.8 mmol/L (ref 3.5–5.1)
SODIUM: 137 mmol/L (ref 135–145)
Sodium: 137 mmol/L (ref 135–145)

## 2015-01-30 LAB — POCT ACTIVATED CLOTTING TIME
ACTIVATED CLOTTING TIME: 214 s
ACTIVATED CLOTTING TIME: 220 s
ACTIVATED CLOTTING TIME: 233 s
Activated Clotting Time: 196 seconds
Activated Clotting Time: 202 seconds
Activated Clotting Time: 208 seconds
Activated Clotting Time: 221 seconds
Activated Clotting Time: 227 seconds

## 2015-01-30 LAB — MAGNESIUM: Magnesium: 2.7 mg/dL — ABNORMAL HIGH (ref 1.5–2.5)

## 2015-01-30 LAB — APTT: APTT: 136 s — AB (ref 24–37)

## 2015-01-30 MED ORDER — LABETALOL HCL 200 MG PO TABS
200.0000 mg | ORAL_TABLET | Freq: Three times a day (TID) | ORAL | Status: DC
  Administered 2015-01-30: 200 mg via ORAL
  Filled 2015-01-30 (×4): qty 1

## 2015-01-30 MED ORDER — HEPARIN SODIUM (PORCINE) 5000 UNIT/ML IJ SOLN
5000.0000 [IU] | Freq: Three times a day (TID) | INTRAMUSCULAR | Status: DC
  Administered 2015-01-30 – 2015-02-15 (×45): 5000 [IU] via SUBCUTANEOUS
  Filled 2015-01-30 (×50): qty 1

## 2015-01-30 MED ORDER — HYDROXYCHLOROQUINE SULFATE 200 MG PO TABS
400.0000 mg | ORAL_TABLET | Freq: Every day | ORAL | Status: DC
  Administered 2015-02-02 – 2015-02-10 (×9): 400 mg via ORAL
  Filled 2015-01-30 (×14): qty 2

## 2015-01-30 MED ORDER — LABETALOL HCL 5 MG/ML IV SOLN
10.0000 mg | INTRAVENOUS | Status: DC | PRN
  Administered 2015-01-31 (×4): 20 mg via INTRAVENOUS
  Administered 2015-02-01: 10 mg via INTRAVENOUS
  Filled 2015-01-30 (×6): qty 4

## 2015-01-30 MED ORDER — CETYLPYRIDINIUM CHLORIDE 0.05 % MT LIQD
7.0000 mL | Freq: Two times a day (BID) | OROMUCOSAL | Status: DC
  Administered 2015-01-30 – 2015-01-31 (×3): 7 mL via OROMUCOSAL

## 2015-01-30 MED ORDER — CHLORHEXIDINE GLUCONATE 0.12 % MT SOLN
15.0000 mL | Freq: Two times a day (BID) | OROMUCOSAL | Status: DC
  Administered 2015-01-30 – 2015-01-31 (×3): 15 mL via OROMUCOSAL
  Filled 2015-01-30 (×3): qty 15

## 2015-01-30 MED ORDER — HEPARIN SODIUM (PORCINE) 1000 UNIT/ML IJ SOLN
2.4000 mL | Freq: Once | INTRAMUSCULAR | Status: AC
  Administered 2015-01-30: 2400 [IU] via INTRAVENOUS

## 2015-01-30 MED ORDER — WHITE PETROLATUM GEL
Status: AC
  Administered 2015-01-30: 0.2
  Filled 2015-01-30: qty 1

## 2015-01-30 MED ORDER — LABETALOL HCL 200 MG PO TABS
200.0000 mg | ORAL_TABLET | Freq: Three times a day (TID) | ORAL | Status: DC
  Filled 2015-01-30 (×3): qty 1

## 2015-01-30 NOTE — Progress Notes (Signed)
PCCM Interval Note  Pt failed swallow eval earlier this PM.  Has labetalol 200mg  q8hrs PO ordered.  Will d/c and replace with labetalol 10-20mg  q2hrs PRN for SBP > 170.   Montey Hora, [facility] Pager: [phone]  or [phone] 01/30/2015, 7:50 PM

## 2015-01-30 NOTE — Procedures (Signed)
Extubation Procedure Note  Patient Details:   Name: [name] [name] DOB: May 10, 1961 MRN: [id number]   Airway Documentation:     Evaluation  O2 sats: stable throughout Complications: No apparent complications Patient did tolerate procedure well. Bilateral Breath Sounds: Diminished Suctioning: Airway Yes   Patient extubated to 2L nasal cannula per MD order.  Positive cuff leak noted.  No evidence of stridor.  Sats currently 95%.  Vitals are stable.  Attempted to perform incentive spirometry, however patient unsuccessful.  Patient able to speak post extubation.  No apparent complications.  Alphia Moh N 01/30/2015, 10:05 AM

## 2015-01-30 NOTE — Progress Notes (Signed)
PULMONARY / CRITICAL CARE MEDICINE   Name: [name] [name] MRN: [id number] DOB: 30-Apr-1961    ADMISSION DATE:  01/18/2015 CONSULTATION DATE:  3/20  REFERRING MD :  Eliseo Squires   CHIEF COMPLAINT:  Tachycardia, resp failure   INITIAL PRESENTATION:  54 y/o female with hx lupus (on cellcept, plaquenil) and associated CKD, DM, HTN initially presented 3/17 with vague c/o SOB, diarrhea, malaise, weakness, cough.  She was admitted by [facility] and r/o for flu.  Was undergoing further w/u for diarrhea, mildly elevated troponin and anemia.  Overnight 3/19 developed intermittent SVT, then persistent tachycardia with HR 150's and worsening SOB and hypoxia requiring NRB.  PCCM consulted 3/20.  She required ETT/MV 3/20. CXR with B infiltrates. Initiated CVVHD on 3/21 pm.   STUDIES:  CXR 3/20>>> neg  2D echo 3/19>>> GU44-03%, grade 1 diastolic dysfunction, mod AR, mod MR, mod TR. No pericardial effusion.  LE doppler US 3/20 >> negative   SIGNIFICANT EVENTS: 3/21  Adenosine challenge >> appears to be sinus rhythm with some non-conducted p-waves 3/23  Tolerated CVVHD, I/O even last 24 h, tachycardia improved, fent + propofol 3/24  High residuals overnight, on max dose fentanyl.  Rx'd with Reglan  3/26  Continues to have agitation with WUA / SBT, changed back to propofol/fentanyl  3/28 seroquel trance, neuro consult 3/29 more awake, follows commands  SUBJECTIVE:  Low gr fever - tachy better controlled -no obvious pain - intermittent agitation  VITAL SIGNS: Temp:  [94.5 F (34.7 C)-100.3 F (37.9 C)] 97.5 F (36.4 C) (03/29 0744) Pulse Rate:  [81-159] 93 (03/29 0900) Resp:  [12-34] 28 (03/29 0900) BP: (94-175)/(52-87) 115/66 mmHg (03/29 0900) SpO2:  [94 %-100 %] 94 % (03/29 0900) FiO2 (%):  [40 %] 40 % (03/29 0800) Weight:  [52.4 kg (115 lb 8.3 oz)] 52.4 kg (115 lb 8.3 oz) (03/29 0500)   VENTILATOR SETTINGS: Vent Mode:  [-] PSV;CPAP FiO2 (%):  [40 %] 40 % Set Rate:  [16 bmp] 16 bmp Vt Set:  [480 mL]  480 mL PEEP:  [5 cmH20] 5 cmH20 Pressure Support:  [8 cmH20-10 cmH20] 8 cmH20 Plateau Pressure:  [14 cmH20-22 cmH20] 22 cmH20   INTAKE / OUTPUT:  Intake/Output Summary (Last 24 hours) at 01/30/15 0914 Last data filed at 01/30/15 0900  Gross per 24 hour  Intake 979.67 ml  Output   1327 ml  Net -347.33 ml    PHYSICAL EXAMINATION: General:  Chronically ill appearing female, intubated and sedated Neuro:  Sedate,  follows commands  HEENT:  MM dry, ETT in place, clear oral secretions Cardiovascular:  s1s2 tachy  Lungs:  Clear bilaterally   Abdomen:  round, mildly distended, soft, BSx4 active   Musculoskeletal:  Warm and dry, no edema  LABS:  CBC  Recent Labs Lab 01/27/15 0400 01/28/15 0345 01/29/15 0506  WBC 8.6 9.9 9.6  HGB 9.2* 9.6* 10.1*  HCT 29.0* 30.3* 31.9*  PLT 226 278 365   Coag's  Recent Labs Lab 01/28/15 0345 01/29/15 0506 01/30/15 0551  APTT 144* >200* 136*    BMET  Recent Labs Lab 01/29/15 0506 01/29/15 1520 01/30/15 0551  NA 137 137 137  K 3.6 4.5 3.8  CL 96 100 99  CO2 26 24 25   BUN 12 14 14   CREATININE 1.61* 1.83* 1.61*  GLUCOSE 128* 144* 152*   Electrolytes  Recent Labs Lab 01/28/15 0345  01/29/15 0506 01/29/15 1520 01/30/15 0551  CALCIUM 9.6  < > 10.5 9.9 10.4  MG 2.3  --  2.6*  --  2.7*  PHOS 1.8*  < > 4.0 4.7* 3.3  < > = values in this interval not displayed. Sepsis Markers  Recent Labs Lab 01/26/15 0355 01/27/15 0445 01/28/15 0345  PROCALCITON 1.09 0.35 0.26     ABG No results for input(s): PHART, PCO2ART, PO2ART in the last 168 hours. Liver Enzymes  Recent Labs Lab 01/29/15 0506 01/29/15 1520 01/30/15 0551  AST  --  49*  --   ALT  --  18  --   ALKPHOS  --  108  --   BILITOT  --  0.8  --   ALBUMIN 2.6* 2.6*  2.7* 2.7*     Cardiac Enzymes No results for input(s): TROPONINI, PROBNP in the last 168 hours. Glucose  Recent Labs Lab 01/29/15 1145 01/29/15 1519 01/29/15 2012 01/30/15 0019  01/30/15 0406 01/30/15 0741  GLUCAP 148* 133* 148* 154* 138* 137*    Imaging Dg Chest Port 1 View  01/29/2015   CLINICAL DATA:  Acute respiratory failure  EXAM: PORTABLE CHEST - 1 VIEW  COMPARISON:  01/28/2015  FINDINGS: Endotracheal tube tip just below the clavicular heads. The orogastric tube reaches the stomach. Bilateral IJ catheters are in stable and unremarkable position.  Normal heart size and aortic contours. The lungs remain clear. A trace left pleural effusion has developed.  IMPRESSION: 1. Stable positioning of tubes and lines. 2. Trace left pleural effusion.   Electronically Signed   By: Monte Fantasia M.D.   On: 01/29/2015 07:29    ASSESSMENT / PLAN:  PULMONARY Acute hypoxic respiratory failure - unknown etiology but in setting B infiltrates.  -treated for HCAP, PE (d/c'd heparin 3/23). Consider acute pulmonary edema in setting HTN and renal failure-  infiltrates have completely cleared. Most likely cause is cardiogenic edema.  Mental status is the issue with extubation at this point.   P:   Daily WUA / SBT with goal extubation now that agitation/HR controlled    CARDIOVASCULAR SVT/sinus tachycardia - HR was 150 at time of her acute decompensation, Adenosine given, appeared to be sinus with some non-conducted p-waves. Presumed reactive to critical illness Hypotension Mildly elevated troponin peak 0.74 dCHF - new.  EF 40-45% with MOD aortic, tricuspid and mitral regurg.  ??PE - BLE venous dopplers prelim negative; doubt PE (heparin gtt stopped 3/23) P:  Dc Hydralazine PRN  change labetalol 200 mg tID (reduced home dose, hx of non-compliance) Hold home clonidine Use lopressor prn for HR 130 & above  RENAL Acute on chronic renal failure, presume lupus nephritis.  Hyponatremia  Metabolic acidosis P:   Appreciate renal service's assistance OK to transition from  CVVHD to iHD - even balance, she is not expected to recover her renal fxn  GASTROINTESTINAL Diarrhea -  initial resolved.  New with reglan 3/26, flexi seal placed.  Abd pain - neg abd xray  High Gastric Residuals Protein Calorie Malnutrition  P:   Hold TF  dc reglan  Flexi-seal 3/26 Pepcid    HEMATOLOGIC Leukocytosis  Anemia - s/p 1 unit PRBC 3/22 P:  Cont iron    INFECTIOUS Fever - resolved  Immunocompromised Host Diarrhea - resolved Unclear etiology of infection, consider PNA in immunocompromised host but CXR has quickly cleared.  ??GI given diarrhea v viral v other source (ie lupus pneumonitis).  Immunocompromised on rx for lupus.  P:   BCx2 3/20 >>>neg UC 3/20 >>> negative  Vanc 3/20 (empiric) >>> 3/22 Zosyn 3/20 (empiric) >>>3/28  C diff cancelled as no  further diarrhea Dc Empiric broad spectrum abx   Hold cellcept, resume plaquenil - will plan to restart when stabilized  ENDOCRINE No active issue    P:   Monitor glucose on chem   NEUROLOGIC Acute encephalopathy -resolving Agitation / Delirium - Head Ct neg 3/27 P:   RASS goal: 0  Fentanyl gtt, allow for lighter sedation, goal to minimize & hopeful extubation soon   FAMILY  - Updates: Mother & sister updated 3/28   Summary - mental status main issue here - improving, hope to extubate, doubt we have to consider lupus cerebritis?  The patient is critically ill with multiple organ systems failure and requires high complexity decision making for assessment and support, frequent evaluation and titration of therapies, application of advanced monitoring technologies and extensive interpretation of multiple databases. Critical Care Time devoted to patient care services described in this note independent of APP time is 35 minutes.    Kara Mead MD. Shade Flood. [place] Pager [phone] If no response call [phone]    01/30/2015, 9:14 AM

## 2015-01-30 NOTE — Progress Notes (Signed)
[facility] ROUNDING NOTE   Subjective:   More alert today, tracking and following some commands Nods appropriately to some but not all questions HR improved with labetolol but drops BP right after dosing   Objective:  Vital signs in last 24 hours:  Temp:  [94.5 F (34.7 C)-100.3 F (37.9 C)] 97.5 F (36.4 C) (03/29 0744) Pulse Rate:  [81-159] 93 (03/29 0900) Resp:  [12-34] 28 (03/29 0900) BP: (94-175)/(52-87) 115/66 mmHg (03/29 0900) SpO2:  [94 %-100 %] 94 % (03/29 0900) FiO2 (%):  [40 %] 40 % (03/29 0800) Weight:  [52.4 kg (115 lb 8.3 oz)] 52.4 kg (115 lb 8.3 oz) (03/29 0500)  Weight change: -0.2 kg (-7.1 oz) Filed Weights   01/28/15 0500 01/29/15 0500 01/30/15 0500  Weight: 54 kg (119 lb 0.8 oz) 52.6 kg (115 lb 15.4 oz) 52.4 kg (115 lb 8.3 oz)   Physical Examination BP 115/66 mmHg  Pulse 93  Temp(Src) 97.5 F (36.4 C) (Oral)  Resp 28  Wt 52.4 kg (115 lb 8.3 oz)  SpO2 94%  LMP 07/18/2012 More awake, eyes open, tracks R IJ dialysis line/CRRT Lungs ant fairly clear Less tachy 90's S1S2 No S3 Abd soft, not tender Flexiseal liquid stool SCD's in place and no edema LE's   Weight trending: (max weight this hospitalization around 60 kg) 3/28 52.6 3/29 52.4  Recent Labs Lab 01/27/15 0400  01/27/15 1515 01/28/15 0345 01/28/15 1600 01/29/15 0506 01/29/15 1520 01/30/15 0551  NA  --   < > 136 137 137 137 137 137  K  --   < > 3.6 3.7 3.7 3.6 4.5 3.8  CL  --   < > 100 100 98 96 100 99  CO2  --   < > 25 27 24 26 24 25   GLUCOSE  --   < > 127* 110* 125* 128* 144* 152*  BUN  --   < > 14 15 15 12 14 14   CREATININE  --   < > 1.71* 1.61* 1.69* 1.61* 1.83* 1.61*  CALCIUM  --   < > 9.1 9.6 9.8 10.5 9.9 10.4  MG 2.2  --  2.4 2.3  --  2.6*  --  2.7*  PHOS 2.2*  < > 2.4  2.4 1.8* 6.1* 4.0 4.7* 3.3  < > = values in this interval not displayed.   Recent Labs Lab 01/28/15 0345 01/28/15 1600 01/29/15 0506 01/29/15 1520 01/30/15 0551  AST  --   --   --   49*  --   ALT  --   --   --  18  --   ALKPHOS  --   --   --  108  --   BILITOT  --   --   --  0.8  --   PROT  --   --   --  7.0  --   ALBUMIN 2.5* 2.6* 2.6* 2.6*  2.7* 2.7*   No results for input(s): LIPASE, AMYLASE in the last 168 hours.  Recent Labs Lab 01/28/15 0950  AMMONIA 21    CBC:  Recent Labs Lab 01/25/15 0400 01/26/15 0355 01/27/15 0400 01/28/15 0345 01/29/15 0506  WBC 6.8 7.8 8.6 9.9 9.6  HGB 9.2* 9.1* 9.2* 9.6* 10.1*  HCT 27.5* 27.8* 29.0* 30.3* 31.9*  MCV 82.8 84.2 86.1 86.8 88.4  PLT 190 215 226 278 365    CBG:  Recent Labs Lab 01/29/15 1519 01/29/15 2012 01/30/15 0019 01/30/15 0406 01/30/15 0741  GLUCAP  133* 148* 154* 138* 137*    Imaging: Ct Head Wo Contrast  01/28/2015   CLINICAL DATA:  Acute encephalopathy, history of brainstem hemorrhage  EXAM: CT HEAD WITHOUT CONTRAST  TECHNIQUE: Contiguous axial images were obtained from the base of the skull through the vertex without intravenous contrast.  COMPARISON:  10/11/2014 brain MRI, most recent head CT 10/04/2014  FINDINGS: Endotracheal tube partly visualized. Mild pansinusitis with mucoperiosteal thickening noted. Orbits are unremarkable. No skull fracture. No acute hemorrhage, infarct, or mass lesion is identified. Minimal periventricular white matter hypodensity likely indicating small vessel ischemic change is reidentified.  IMPRESSION: No acute intracranial abnormality.   Electronically Signed   By: Conchita Paris M.D.   On: 01/28/2015 11:15   Dg Chest Port 1 View  01/29/2015   CLINICAL DATA:  Acute respiratory failure  EXAM: PORTABLE CHEST - 1 VIEW  COMPARISON:  01/28/2015  FINDINGS: Endotracheal tube tip just below the clavicular heads. The orogastric tube reaches the stomach. Bilateral IJ catheters are in stable and unremarkable position.  Normal heart size and aortic contours. The lungs remain clear. A trace left pleural effusion has developed.  IMPRESSION: 1. Stable positioning of tubes and  lines. 2. Trace left pleural effusion.   Electronically Signed   By: Monte Fantasia M.D.   On: 01/29/2015 07:29     Medications:   . dextrose 5 % and 0.9% NaCl 10 mL/hr at 01/26/15 1200  . feeding supplement (VITAL AF 1.2 CAL) 1,000 mL (01/30/15 0616)  . fentaNYL infusion INTRAVENOUS Stopped (01/29/15 0900)  . heparin 10,000 units/ 20 mL infusion syringe 1,250 Units/hr (01/30/15 0855)  . dialysis replacement fluid (prismasate) 300 mL/hr at 01/28/15 1935  . dialysis replacement fluid (prismasate) 200 mL/hr at 01/29/15 1432  . dialysate (PRISMASATE) 1,000 mL/hr at 01/30/15 0559  . propofol Stopped (01/29/15 0900)   . antiseptic oral rinse  7 mL Mouth Rinse QID  . chlorhexidine  15 mL Mouth Rinse BID  . darbepoetin (ARANESP) injection - NON-DIALYSIS  150 mcg Subcutaneous Q Fri-1800  . doxercalciferol  1 mcg Intravenous Q M,W,F-1800  . famotidine (PEPCID) IV  20 mg Intravenous Q24H  . feeding supplement (PRO-STAT SUGAR FREE 64)  30 mL Per Tube BID  . folic acid  1 mg Oral Daily  . labetalol  300 mg Oral BID  . multivitamin  1 tablet Oral QHS  . QUEtiapine  25 mg Oral BID  . thiamine  100 mg Oral Daily   acetaminophen, fentaNYL, heparin, heparin, heparin, hydrocortisone-pramoxine, metoprolol, ondansetron **OR** ondansetron (ZOFRAN) IV  Assessment/ Plan:  1. AKI on CKD5/new ESRD - CRRT since 3/21 (temp cath placed 3/21) CRRT a little less hemodynamically stressful than intermittent HD but she is finally showing some improvement in HR to the 90's with labetolol. I would like to try to transition her to intermittent hemodialysis now that her HR has improved. Will need TDC placed for HD when felt by all to be stable enough. 2. Metabolic acidosis - resolved  3. Resp failure - No PE, infiltrates cleared ( favors pulmonary edema) 4. Fever/leukocytosis - empiric zosyn - completed course. No ATB's at present.  5. Sustained tachycardia - SVT vs sinus tachy. Trops mildly up. EF 40-45%. HR has  improved with labetolol but big BP swings - change to 200 mg Q8H and reassess.  6. HTN - home labetolol resumed and getting prn metoprolol 7. Diarrhea - rectal tube in place  8. SLE - Patient now off immunosuppressants, including steroids. Dr. [name]  ordered ANA ds DNA and complements although I am not sure if this will help direct management. Complements not depressed. Since more awake maybe lupus cerebritis not as bing a question. She is already committed to dialysis and renal function was failing as outpatient so renal biopsy not needed. 9. AMS - as above is improved today 10. Anemia - s/p transfusion 01/23/15. Fe studies normal on 01/05/15. 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 Added IV hectorol but with hypercalcemia will stop. Once able to take po will need sensipar.   12. Altered mental status - seems to be improving today. Follow.    Jamal Maes, MD [facility] [phone] Pager 01/30/2015, 9:03 AM

## 2015-01-30 NOTE — Evaluation (Addendum)
Clinical/Bedside Swallow Evaluation Patient Details  Name: [name] [name] MRN: [id number] Date of Birth: [birth year]  Today's Date: 01/30/2015 Time: SLP Start Time (ACUTE ONLY): 1440 SLP Stop Time (ACUTE ONLY): 1505 SLP Time Calculation (min) (ACUTE ONLY): 25 min  Past Medical History:  Past Medical History  Diagnosis Date  . Hypertension   . Lupus   . CKD (chronic kidney disease)     due to lupus/Dr. [name]  . Diabetes mellitus   . Stenosis of cervical spine region     with HNP at C5/6, C6/7  . Metatarsal bone fracture right    4th  . 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 Arthritis     RA  . Anemia   . Stroke 11/2014    left sided weakness, dysphagia  . Pain in joints   . Paresthesias   . Hyperlipidemia   . Colitis     ? per hospital notes 11/2014  . Lower GI bleed   . Hyponatremia   . Metabolic acidosis   . Hyperplastic colon polyp   . Hemorrhoids   . Blood transfusion without reported diagnosis    Past Surgical History:  Past Surgical History  Procedure Laterality Date  . Breast biopsy    . Tubal ligation     HPI:  Pt is a 54 y.o. female who presented to ED 3/17 with SOB along with cough/ congestion. Labwork indicated significant anemia. PMH of lupus, CKD, DM, HTN, along with hemorrhagic infarct stroke in the pons in 10/2014- some dysphagia but discharged on regular diet/ thin liquids. Pt reported having diarrhea after every meal within 45 minutes of eating. Had acute hypoxic respiratory failure on 3/20, requiring intubation; extubated 3/29 a.m. Bedside swallow eval ordered to assess swallow function post-prolonged intubation.   Assessment / Plan / Recommendation Clinical Impression  Pt currently presenting with multiple s/s of aspiration with trials of ice chips, including wet vocal quality and immediate/ delayed productive throat clear- tolerated suction. Pt also with reduced hyolaryngeal excursion with palpation and  was unable to trigger a swallow with 2nd ice chip trial. Pt at severe risk of aspiration- risk factors including prolonged intubation, decreased airway protection as evidenced by weak cough and nearly aphonic voice quality, decreased respiratory status, decreased cognition (followed about 50% of 1 step directions with oral motor exam- max cues- especially for pt to open mouth for oral care). Recommend that pt remain strictly NPO (no ice chips) with oral care every 4 hours. Will continue to follow closely for PO readiness.    Aspiration Risk  Severe    Diet Recommendation NPO   Medication Administration: Via alternative means    Other  Recommendations Oral Care Recommendations: Oral care Q4 per protocol Other Recommendations: Have oral suction available   Follow Up Recommendations  Other (comment) (TBD)    Frequency and Duration min 2x/week  2 weeks   Pertinent Vitals/Pain n/a    SLP Swallow Goals     Swallow Study Prior Functional Status       General HPI: Pt is a 54 y.o. female who presented to ED 3/17 with SOB along with cough/ congestion. Labwork indicated significant anemia. PMH of lupus, CKD, DM, HTN, along with hemorrhagic infarct stroke in the pons in 10/2014- some dysphagia but discharged on regular diet/ thin liquids. Pt reported having diarrhea after every meal within 45 minutes of eating. Had acute hypoxic respiratory failure on  3/20, requiring intubation; extubated 3/29 a.m. Bedside swallow eval ordered to assess swallow function post-prolonged intubation. Type of Study: Bedside swallow evaluation Diet Prior to this Study: NPO Temperature Spikes Noted: No Respiratory Status: Nasal cannula History of Recent Intubation: Yes Length of Intubations (days): 9 days Date extubated: 01/30/15 Behavior/Cognition: Alert;Requires cueing;Cooperative Oral Cavity - Dentition: Adequate natural dentition Self-Feeding Abilities: Needs assist Patient Positioning: Upright in  bed Baseline Vocal Quality: Breathy;Hoarse;Other (comment) (nearly aphonic) Volitional Cough: Weak;Congested Volitional Swallow: Unable to elicit    Oral/Motor/Sensory Function Overall Oral Motor/Sensory Function: Other (comment) (unable to assess- decreased ability to follow directions)   Ice Chips Ice chips: Impaired Presentation: Spoon Pharyngeal Phase Impairments: Decreased hyoid-laryngeal movement;Unable to trigger swallow;Wet Vocal Quality;Throat Clearing - Immediate;Throat Clearing - Delayed   Thin Liquid Thin Liquid: Not tested    Nectar Thick Nectar Thick Liquid: Not tested   Honey Thick Honey Thick Liquid: Not tested   Puree Puree: Not tested   Solid   GO    Solid: Not tested       Kern Reap, MA, [name] 01/30/2015,3:10 PM  [phone]

## 2015-01-30 NOTE — Progress Notes (Signed)
PT Cancellation Note  Patient Details Name: [name] [name] MRN: [id number] DOB: Apr 17, 1961   Cancelled Treatment:    Reason Eval/Treat Not Completed: Patient not medically ready PT evaluation order acknowledged. Noted that eval be held until tomorrow 3/30. Will follow up as ordered.  Ellouise Newer 01/30/2015, 10:34 AM Elayne Snare, [place]

## 2015-01-30 NOTE — Progress Notes (Signed)
Wasted 166mL of Fentanyl Gtt in the sink. Aldona Lento, RN witnessed.

## 2015-01-30 NOTE — Progress Notes (Signed)
Subjective: Patient improved this morning.  Awake and alert.  Follows commands.  No agitation noted.    Objective: Current vital signs: BP 123/60 mmHg  Pulse 96  Temp(Src) 97.5 F (36.4 C) (Oral)  Resp 28  Wt 52.4 kg (115 lb 8.3 oz)  SpO2 94%  LMP 07/18/2012 Vital signs in last 24 hours: Temp:  [94.5 F (34.7 C)-100.3 F (37.9 C)] 97.5 F (36.4 C) (03/29 0744) Pulse Rate:  [81-159] 96 (03/29 0936) Resp:  [12-34] 28 (03/29 0900) BP: (94-175)/(52-87) 123/60 mmHg (03/29 0936) SpO2:  [94 %-100 %] 94 % (03/29 0900) FiO2 (%):  [40 %] 40 % (03/29 0800) Weight:  [52.4 kg (115 lb 8.3 oz)] 52.4 kg (115 lb 8.3 oz) (03/29 0500)  Intake/Output from previous day: 03/28 0701 - 03/29 0700 In: 1053.1 [I.V.:243.4; NG/GT:709.7; IV Piggyback:100] Out: 1513 [Emesis/NG output:450; Stool:100] Intake/Output this shift: Total I/O In: 125 [I.V.:20; NG/GT:105] Out: 114 [Other:114] Nutritional status: Diet NPO time specified Except for: Ice Chips  Neurologic Exam: Mental Status: Alert.  Intubated.  Follows commands. Cranial Nerves: II: Discs flat bilaterally; Visual fields grossly normal, pupils equal, round, reactive to light and accommodation III,IV, VI: ptosis not present, extra-ocular motions intact bilaterally V,VII: Corneals intact bilaterally VIII: hearing normal bilaterally IX,X: gag reflex not tested due to ETT XI: bilateral shoulder shrug XII: not tested due to ETT Motor: Moves all extremities to command UE's>LE's Sensory: Responds to light touch throughout Deep Tendon Reflexes: 3+ throughout Plantars: Right: downgoing   Left: downgoing   Lab Results: Basic Metabolic Panel:  Recent Labs Lab 01/27/15 0400  01/27/15 1515 01/28/15 0345 01/28/15 1600 01/29/15 0506 01/29/15 1520 01/30/15 0551  NA  --   < > 136 137 137 137 137 137  K  --   < > 3.6 3.7 3.7 3.6 4.5 3.8  CL  --   < > 100 100 98 96 100 99  CO2  --   < > 25 27 24 26 24 25   GLUCOSE  --   < > 127* 110* 125*  128* 144* 152*  BUN  --   < > 14 15 15 12 14 14   CREATININE  --   < > 1.71* 1.61* 1.69* 1.61* 1.83* 1.61*  CALCIUM  --   < > 9.1 9.6 9.8 10.5 9.9 10.4  MG 2.2  --  2.4 2.3  --  2.6*  --  2.7*  PHOS 2.2*  < > 2.4  2.4 1.8* 6.1* 4.0 4.7* 3.3  < > = values in this interval not displayed.  Liver Function Tests:  Recent Labs Lab 01/28/15 0345 01/28/15 1600 01/29/15 0506 01/29/15 1520 01/30/15 0551  AST  --   --   --  49*  --   ALT  --   --   --  18  --   ALKPHOS  --   --   --  108  --   BILITOT  --   --   --  0.8  --   PROT  --   --   --  7.0  --   ALBUMIN 2.5* 2.6* 2.6* 2.6*  2.7* 2.7*   No results for input(s): LIPASE, AMYLASE in the last 168 hours.  Recent Labs Lab 01/28/15 0950  AMMONIA 21    CBC:  Recent Labs Lab 01/25/15 0400 01/26/15 0355 01/27/15 0400 01/28/15 0345 01/29/15 0506  WBC 6.8 7.8 8.6 9.9 9.6  HGB 9.2* 9.1* 9.2* 9.6* 10.1*  HCT 27.5* 27.8* 29.0* 30.3*  31.9*  MCV 82.8 84.2 86.1 86.8 88.4  PLT 190 215 226 278 365    Cardiac Enzymes: No results for input(s): CKTOTAL, CKMB, CKMBINDEX, TROPONINI in the last 168 hours.  Lipid Panel:  Recent Labs Lab 01/24/15 1530 01/27/15 1541  TRIG 258* 234*    CBG:  Recent Labs Lab 01/29/15 1519 01/29/15 2012 01/30/15 0019 01/30/15 0406 01/30/15 0741  GLUCAP 133* 148* 154* 138* 75*    Microbiology: Results for orders placed or performed during the hospital encounter of 01/18/15  MRSA PCR Screening     Status: None   Collection Time: 01/21/15 10:04 AM  Result Value Ref Range Status   MRSA by PCR NEGATIVE NEGATIVE Final    Comment:        The GeneXpert MRSA Assay (FDA approved for NASAL specimens only), is one component of a comprehensive MRSA colonization surveillance program. It is not intended to diagnose MRSA infection nor to guide or monitor treatment for MRSA infections.   Culture, blood (routine x 2)     Status: None   Collection Time: 01/21/15 11:30 AM  Result Value Ref  Range Status   Specimen Description BLOOD LEFT ASSIST CONTROL  Final   Special Requests BOTTLES DRAWN AEROBIC ONLY 8CC  Final   Culture   Final    NO GROWTH 5 DAYS Performed at [facility]    Report Status 01/27/2015 FINAL  Final  Culture, Urine     Status: None   Collection Time: 01/21/15  4:24 PM  Result Value Ref Range Status   Specimen Description URINE, CATHETERIZED  Final   Special Requests Immunocompromised  Final   Colony Count NO GROWTH Performed at [facility]   Final   Culture NO GROWTH Performed at [facility]   Final   Report Status 01/22/2015 FINAL  Final  Clostridium Difficile by PCR     Status: None   Collection Time: 01/27/15  9:38 AM  Result Value Ref Range Status   C difficile by pcr NEGATIVE NEGATIVE Final    Coagulation Studies: No results for input(s): LABPROT, INR in the last 72 hours.  Imaging: Ct Head Wo Contrast  01/28/2015   CLINICAL DATA:  Acute encephalopathy, history of brainstem hemorrhage  EXAM: CT HEAD WITHOUT CONTRAST  TECHNIQUE: Contiguous axial images were obtained from the base of the skull through the vertex without intravenous contrast.  COMPARISON:  10/11/2014 brain MRI, most recent head CT 10/04/2014  FINDINGS: Endotracheal tube partly visualized. Mild pansinusitis with mucoperiosteal thickening noted. Orbits are unremarkable. No skull fracture. No acute hemorrhage, infarct, or mass lesion is identified. Minimal periventricular white matter hypodensity likely indicating small vessel ischemic change is reidentified.  IMPRESSION: No acute intracranial abnormality.   Electronically Signed   By: Conchita Paris M.D.   On: 01/28/2015 11:15   Dg Chest Port 1 View  01/29/2015   CLINICAL DATA:  Acute respiratory failure  EXAM: PORTABLE CHEST - 1 VIEW  COMPARISON:  01/28/2015  FINDINGS: Endotracheal tube tip just below the clavicular heads. The orogastric tube reaches the stomach. Bilateral IJ catheters are in stable and  unremarkable position.  Normal heart size and aortic contours. The lungs remain clear. A trace left pleural effusion has developed.  IMPRESSION: 1. Stable positioning of tubes and lines. 2. Trace left pleural effusion.   Electronically Signed   By: Monte Fantasia M.D.   On: 01/29/2015 07:29    Medications:  I have reviewed the patient's current medications. Scheduled: . antiseptic  oral rinse  7 mL Mouth Rinse QID  . chlorhexidine  15 mL Mouth Rinse BID  . darbepoetin (ARANESP) injection - NON-DIALYSIS  150 mcg Subcutaneous Q Fri-1800  . famotidine (PEPCID) IV  20 mg Intravenous Q24H  . feeding supplement (PRO-STAT SUGAR FREE 64)  30 mL Per Tube BID  . folic acid  1 mg Oral Daily  . heparin subcutaneous  5,000 Units Subcutaneous 3 times per day  . labetalol  200 mg Oral Q8H  . multivitamin  1 tablet Oral QHS  . thiamine  100 mg Oral Daily    Assessment/Plan: Patient improved.  Would hold off on neurological work up for lupus cerebritis at this time and continue to follow clinically.  Hepatic function panel not remarkable.    Recommendations: 1.  EEG canceled 2.  Will hold off on MRI and LP for now 3.  Will continue to follow with you.     LOS: 11 days   Alexis Goodell, MD [facility] [phone] 01/30/2015  9:46 AM

## 2015-01-31 ENCOUNTER — Inpatient Hospital Stay (HOSPITAL_COMMUNITY): Payer: Managed Care, Other (non HMO)

## 2015-01-31 ENCOUNTER — Encounter (HOSPITAL_COMMUNITY): Payer: Managed Care, Other (non HMO)

## 2015-01-31 ENCOUNTER — Other Ambulatory Visit (HOSPITAL_COMMUNITY): Payer: Managed Care, Other (non HMO)

## 2015-01-31 ENCOUNTER — Ambulatory Visit: Payer: Managed Care, Other (non HMO) | Admitting: Vascular Surgery

## 2015-01-31 DIAGNOSIS — J9811 Atelectasis: Secondary | ICD-10-CM | POA: Insufficient documentation

## 2015-01-31 DIAGNOSIS — R410 Disorientation, unspecified: Secondary | ICD-10-CM | POA: Insufficient documentation

## 2015-01-31 LAB — POCT I-STAT 3, ART BLOOD GAS (G3+)
Acid-base deficit: 4 mmol/L — ABNORMAL HIGH (ref 0.0–2.0)
Acid-base deficit: 3 mmol/L — ABNORMAL HIGH (ref 0.0–2.0)
BICARBONATE: 20.5 meq/L (ref 20.0–24.0)
Bicarbonate: 21.6 mEq/L (ref 20.0–24.0)
O2 SAT: 100 %
O2 Saturation: 86 %
TCO2: 21 mmol/L (ref 0–100)
TCO2: 23 mmol/L (ref 0–100)
pCO2 arterial: 34.3 mmHg — ABNORMAL LOW (ref 35.0–45.0)
pCO2 arterial: 36.4 mmHg (ref 35.0–45.0)
pH, Arterial: 7.383 (ref 7.350–7.450)
pH, Arterial: 7.381 (ref 7.350–7.450)
pO2, Arterial: 52 mmHg — ABNORMAL LOW (ref 80.0–100.0)
pO2, Arterial: 208 mmHg — ABNORMAL HIGH (ref 80.0–100.0)

## 2015-01-31 LAB — CBC
HCT: 35.9 % — ABNORMAL LOW (ref 36.0–46.0)
HEMOGLOBIN: 10.9 g/dL — AB (ref 12.0–15.0)
MCH: 27.9 pg (ref 26.0–34.0)
MCHC: 30.4 g/dL (ref 30.0–36.0)
MCV: 91.8 fL (ref 78.0–100.0)
PLATELETS: 458 10*3/uL — AB (ref 150–400)
RBC: 3.91 MIL/uL (ref 3.87–5.11)
RDW: 18.3 % — AB (ref 11.5–15.5)
WBC: 14.7 10*3/uL — ABNORMAL HIGH (ref 4.0–10.5)

## 2015-01-31 LAB — GLUCOSE, CAPILLARY
GLUCOSE-CAPILLARY: 126 mg/dL — AB (ref 70–99)
Glucose-Capillary: 144 mg/dL — ABNORMAL HIGH (ref 70–99)
Glucose-Capillary: 113 mg/dL — ABNORMAL HIGH (ref 70–99)
Glucose-Capillary: 115 mg/dL — ABNORMAL HIGH (ref 70–99)

## 2015-01-31 LAB — RENAL FUNCTION PANEL
ALBUMIN: 2.9 g/dL — AB (ref 3.5–5.2)
Anion gap: 11 (ref 5–15)
BUN: 29 mg/dL — AB (ref 6–23)
CHLORIDE: 105 mmol/L (ref 96–112)
CO2: 25 mmol/L (ref 19–32)
Calcium: 10.9 mg/dL — ABNORMAL HIGH (ref 8.4–10.5)
Creatinine, Ser: 3.31 mg/dL — ABNORMAL HIGH (ref 0.50–1.10)
GFR calc non Af Amer: 15 mL/min — ABNORMAL LOW (ref >90)
GFR, EST AFRICAN AMERICAN: 17 mL/min — AB (ref >90)
GLUCOSE: 135 mg/dL — AB (ref 70–99)
Phosphorus: 5.4 mg/dL — ABNORMAL HIGH (ref 2.3–4.6)
Potassium: 4.1 mmol/L (ref 3.5–5.1)
Sodium: 141 mmol/L (ref 135–145)

## 2015-01-31 LAB — ANTI-DNA ANTIBODY, DOUBLE-STRANDED: ds DNA Ab: 1 IU/mL (ref 0–9)

## 2015-01-31 MED ORDER — SODIUM CHLORIDE 0.9 % IV SOLN
25.0000 ug/h | INTRAVENOUS | Status: DC
  Administered 2015-01-31: 50 ug/h via INTRAVENOUS
  Administered 2015-02-02 – 2015-02-04 (×2): 75 ug/h via INTRAVENOUS
  Filled 2015-01-31 (×5): qty 50

## 2015-01-31 MED ORDER — LABETALOL HCL 200 MG PO TABS
200.0000 mg | ORAL_TABLET | Freq: Three times a day (TID) | ORAL | Status: DC
  Administered 2015-01-31 – 2015-02-01 (×2): 200 mg
  Filled 2015-01-31 (×6): qty 1

## 2015-01-31 MED ORDER — PIPERACILLIN-TAZOBACTAM IN DEX 2-0.25 GM/50ML IV SOLN
2.2500 g | Freq: Three times a day (TID) | INTRAVENOUS | Status: DC
  Administered 2015-01-31 – 2015-02-01 (×2): 2.25 g via INTRAVENOUS
  Filled 2015-01-31 (×3): qty 50

## 2015-01-31 MED ORDER — FENTANYL BOLUS VIA INFUSION
50.0000 ug | INTRAVENOUS | Status: DC | PRN
Start: 2015-01-31 — End: 2015-02-05
  Administered 2015-02-03 (×2): 50 ug via INTRAVENOUS
  Filled 2015-01-31: qty 50

## 2015-01-31 MED ORDER — MIDAZOLAM HCL 2 MG/2ML IJ SOLN
INTRAMUSCULAR | Status: AC
  Administered 2015-01-31: 4 mg
  Filled 2015-01-31: qty 4

## 2015-01-31 MED ORDER — ETOMIDATE 2 MG/ML IV SOLN
INTRAVENOUS | Status: AC
  Administered 2015-01-31: 20 mg
  Filled 2015-01-31: qty 10

## 2015-01-31 MED ORDER — VANCOMYCIN HCL IN DEXTROSE 1-5 GM/200ML-% IV SOLN
1000.0000 mg | Freq: Once | INTRAVENOUS | Status: AC
  Administered 2015-01-31: 1000 mg via INTRAVENOUS
  Filled 2015-01-31: qty 200

## 2015-01-31 MED ORDER — DARBEPOETIN ALFA 40 MCG/0.4ML IJ SOSY: 40.0000 ug | PREFILLED_SYRINGE | INTRAMUSCULAR | Status: DC

## 2015-01-31 MED ORDER — FENTANYL CITRATE 0.05 MG/ML IJ SOLN
50.0000 ug | Freq: Once | INTRAMUSCULAR | Status: AC
  Administered 2015-01-31: 200 ug via INTRAVENOUS

## 2015-01-31 MED ORDER — LABETALOL 40 MG/ML PEDIATRIC ORAL SUSPENSION
200.0000 mg | Freq: Three times a day (TID) | ORAL | Status: DC
  Filled 2015-01-31 (×3): qty 5

## 2015-01-31 MED ORDER — NICARDIPINE HCL IN NACL 20-0.86 MG/200ML-% IV SOLN
3.0000 mg/h | INTRAVENOUS | Status: DC
  Filled 2015-01-31 (×2): qty 200

## 2015-01-31 MED ORDER — FENTANYL CITRATE 0.05 MG/ML IJ SOLN
INTRAMUSCULAR | Status: AC
  Administered 2015-01-31: 100 ug
  Filled 2015-01-31: qty 2

## 2015-01-31 MED ORDER — FENTANYL CITRATE 0.05 MG/ML IJ SOLN
INTRAMUSCULAR | Status: AC
  Filled 2015-01-31: qty 4

## 2015-01-31 NOTE — Progress Notes (Addendum)
ANTIBIOTIC CONSULT NOTE - INITIAL  Pharmacy Consult for vancomycin,  Cefepime Indication: rule out pneumonia  Allergies  Allergen Reactions  . Sulfa Antibiotics Itching    Patient Measurements: Weight: 111 lb 5.3 oz (50.5 kg) Vital Signs: Temp: 98.2 F (36.8 C) (03/30 1100) Temp Source: Oral (03/30 1100) BP: 120/79 mmHg (03/30 1330) Pulse Rate: 125 (03/30 1330) Intake/Output from previous day: 03/29 0701 - 03/30 0700 In: 385 [I.V.:200; NG/GT:135; IV Piggyback:50] Out: 158  Intake/Output from this shift: Total I/O In: 60 [I.V.:60] Out: -   Labs:  Recent Labs  01/29/15 0506  01/30/15 0551 01/30/15 1600 01/31/15 0535  WBC 9.6  --   --   --  14.7*  HGB 10.1*  --   --   --  10.9*  PLT 365  --   --   --  458*  CREATININE 1.61*  < > 1.61* 2.12* 3.31*  < > = values in this interval not displayed. Estimated Creatinine Clearance: 15.9 mL/min (by C-G formula based on Cr of 3.31). No results for input(s): VANCOTROUGH, VANCOPEAK, VANCORANDOM, GENTTROUGH, GENTPEAK, GENTRANDOM, TOBRATROUGH, TOBRAPEAK, TOBRARND, AMIKACINPEAK, AMIKACINTROU, AMIKACIN in the last 72 hours.   Microbiology: Recent Results (from the past 720 hour(s))  MRSA PCR Screening     Status: None   Collection Time: 01/21/15 10:04 AM  Result Value Ref Range Status   MRSA by PCR NEGATIVE NEGATIVE Final    Comment:        The GeneXpert MRSA Assay (FDA approved for NASAL specimens only), is one component of a comprehensive MRSA colonization surveillance program. It is not intended to diagnose MRSA infection nor to guide or monitor treatment for MRSA infections.   Culture, blood (routine x 2)     Status: None   Collection Time: 01/21/15 11:30 AM  Result Value Ref Range Status   Specimen Description BLOOD LEFT ASSIST CONTROL  Final   Special Requests BOTTLES DRAWN AEROBIC ONLY 8CC  Final   Culture   Final    NO GROWTH 5 DAYS Performed at [facility]    Report Status 01/27/2015 FINAL  Final   Culture, Urine     Status: None   Collection Time: 01/21/15  4:24 PM  Result Value Ref Range Status   Specimen Description URINE, CATHETERIZED  Final   Special Requests Immunocompromised  Final   Colony Count NO GROWTH Performed at [facility]   Final   Culture NO GROWTH Performed at [facility]   Final   Report Status 01/22/2015 FINAL  Final  Clostridium Difficile by PCR     Status: None   Collection Time: 01/27/15  9:38 AM  Result Value Ref Range Status   C difficile by pcr NEGATIVE NEGATIVE Final    Medical History: Past Medical History  Diagnosis Date  . Hypertension   . Lupus   . CKD (chronic kidney disease)     due to lupus/Dr. [name]  . Diabetes mellitus   . Stenosis of cervical spine region     with HNP at C5/6, C6/7  . Metatarsal bone fracture right    4th  . 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 Arthritis     RA  . Anemia   . Stroke 11/2014    left sided weakness, dysphagia  . Pain in joints   . Paresthesias   . Hyperlipidemia   . Colitis     ?  per hospital notes 11/2014  . Lower GI bleed   . Hyponatremia   . Metabolic acidosis   . Hyperplastic colon polyp   . Hemorrhoids   . 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  WBC 14.7, SCr 3.31 - receiving HD now x4hrs (just came off CRRT 3/29). Last day of Zosyn was 3/28. Last day of vanc was 3/23.   Goal of Therapy:  Vancomycin trough level 15-20 mcg/ml  Plan:  Cefepime 2g IV now, then plan for 2g post HD. Vancomycin 1000mg  IV post HD today, then plan for 500mg  post HD pending HD schedule.   Sloan Leiter, PharmD, BCPS Clinical Pharmacist [phone] 01/31/2015,2:59 PM

## 2015-01-31 NOTE — Progress Notes (Signed)
[facility] ROUNDING NOTE   Subjective:   Events of last 24 hours noted Extubated Failed swallow evaluation Has no feeding tube at this time Labetolol was changed to prn with BP parameter   Objective:  Vital signs in last 24 hours:  Temp:  [97.2 F (36.2 C)-98.6 F (37 C)] 98 F (36.7 C) (03/30 0700) Pulse Rate:  [91-138] 114 (03/30 0630) Resp:  [23-36] 27 (03/30 0630) BP: (111-177)/(58-86) 142/72 mmHg (03/30 0630) SpO2:  [94 %-100 %] 96 % (03/30 0630) Weight:  [50.5 kg (111 lb 5.3 oz)] 50.5 kg (111 lb 5.3 oz) (03/30 0528)  Weight change: -1.9 kg (-4 lb 3 oz) Filed Weights   01/29/15 0500 01/30/15 0500 01/31/15 0528  Weight: 52.6 kg (115 lb 15.4 oz) 52.4 kg (115 lb 8.3 oz) 50.5 kg (111 lb 5.3 oz)   Physical Examination BP 142/72 mmHg  Pulse 114  Temp(Src) 98 F (36.7 C) (Oral)  Resp 27  Wt 50.5 kg (111 lb 5.3 oz)  SpO2 96%  LMP 07/18/2012 Less awake - not tracking or following commands as she did yesterday R IJ dialysis line dressing intact Lungs clear S1S2 No S3 Abd soft, not tender Flexiseal liquid stool SCD's in place and no edema LE's   Weight trending: (max weight this hospitalization around 60 kg) 3/28 52.6 3/29 52.4 3/30 50.5 (?)   Recent Labs Lab 01/27/15 0400  01/27/15 1515 01/28/15 0345  01/29/15 0506 01/29/15 1520 01/30/15 0551 01/30/15 1600 01/31/15 0535  NA  --   < > 136 137  < > 137 137 137 137 141  K  --   < > 3.6 3.7  < > 3.6 4.5 3.8 3.8 4.1  CL  --   < > 100 100  < > 96 100 99 103 105  CO2  --   < > 25 27  < > 26 24 25 28 25   GLUCOSE  --   < > 127* 110*  < > 128* 144* 152* 131* 135*  BUN  --   < > 14 15  < > 12 14 14 22  29*  CREATININE  --   < > 1.71* 1.61*  < > 1.61* 1.83* 1.61* 2.12* 3.31*  CALCIUM  --   < > 9.1 9.6  < > 10.5 9.9 10.4 10.3 10.9*  MG 2.2  --  2.4 2.3  --  2.6*  --  2.7*  --   --   PHOS 2.2*  < > 2.4  2.4 1.8*  < > 4.0 4.7* 3.3 3.8 5.4*  < > = values in this interval not displayed.   Recent  Labs Lab 01/29/15 0506 01/29/15 1520 01/30/15 0551 01/30/15 1600 01/31/15 0535  AST  --  49*  --   --   --   ALT  --  18  --   --   --   ALKPHOS  --  108  --   --   --   BILITOT  --  0.8  --   --   --   PROT  --  7.0  --   --   --   ALBUMIN 2.6* 2.6*  2.7* 2.7* 2.7* 2.9*   No results for input(s): LIPASE, AMYLASE in the last 168 hours.  Recent Labs Lab 01/28/15 0950  AMMONIA 21    CBC:  Recent Labs Lab 01/26/15 0355 01/27/15 0400 01/28/15 0345 01/29/15 0506 01/31/15 0535  WBC 7.8 8.6 9.9 9.6 14.7*  HGB 9.1*  9.2* 9.6* 10.1* 10.9*  HCT 27.8* 29.0* 30.3* 31.9* 35.9*  MCV 84.2 86.1 86.8 88.4 91.8  PLT 215 226 278 365 458*    CBG:  Recent Labs Lab 01/30/15 1627 01/30/15 2006 01/30/15 2352 01/31/15 0433 01/31/15 0736  GLUCAP 117* 129* 138* 126* 144*    Imaging: No results found.   Medications:   . dextrose 5 % and 0.9% NaCl 10 mL/hr at 01/30/15 1200   . antiseptic oral rinse  7 mL Mouth Rinse q12n4p  . chlorhexidine  15 mL Mouth Rinse BID  . darbepoetin (ARANESP) injection - NON-DIALYSIS  150 mcg Subcutaneous Q Fri-1800  . famotidine (PEPCID) IV  20 mg Intravenous Q24H  . folic acid  1 mg Oral Daily  . heparin subcutaneous  5,000 Units Subcutaneous 3 times per day  . hydroxychloroquine  400 mg Oral Daily  . multivitamin  1 tablet Oral QHS  . thiamine  100 mg Oral Daily   acetaminophen, hydrocortisone-pramoxine, labetalol, metoprolol, ondansetron **OR** ondansetron (ZOFRAN) IV  Assessment/ Plan:  1. AKI on CKD5/new ESRD - CRRT 3/22-3/29 stopped yesterday (temp cath placed 3/21) CRRT Has no emergent need for dialysis today (volume/K and acid base all look fine. Will reassess in the AM.  Will need TDC placed for HD when felt by all to be stable enough, and a permanent access before she leaves the hospital. 2. Resp failure - No PE, infiltrates cleared ( favored pulmonary edema) and now extubated to nasal cannula  3. Fever/leukocytosis - empiric zosyn  - completed 8 day course on 3/28 (plus 3 days vanco) . No ATB's at present. WBC rising - watch for s/o infection 4. Sustained tachycardia - was doing better with scheduled labetolol via tube - failed swallow and has no tube - getting "prn" will need about every 2 hours 5. HTN - See #2 6. Diarrhea - rectal tube in place  7. SLE - plaquenil ordered but not getting as can't swallow 8. AMS - does not look as good to me today. Not following commands. MRI and other studies were cancelled yesterday because she looked better - will await their recs when they see her today 9. Anemia - s/p transfusion 01/23/15. Fe studies normal on 01/05/15. On Darbe 150 QFriday and Hb rising - will back off to 40 on the aranesp. 10. Secondary hyperpara - last PTH increased to > 1000 (1171) on 01/23/15 Phos rising some off CRRT. When has tube back in or able to swallow will need non-calcium based binder and sensipar.    Jamal Maes, MD [facility] [phone] Pager 01/31/2015, 8:56 AM

## 2015-01-31 NOTE — Progress Notes (Signed)
OT Cancellation Note  Patient Details Name: [name] [name] MRN: [id number] DOB: [birth year]   Cancelled Treatment:    Reason Eval/Treat Not Completed: Medical issues which prohibited therapy (Pt reintubated due to flash pulmonary edema.)  [name] 01/31/2015, 1:46 PM

## 2015-01-31 NOTE — Progress Notes (Signed)
Received call from Dr. Elsworth Soho Pt has developed flash pulmonary edema and is requiring re-intubation He is requesting HD Orders in for a 4 hour treatment to remove 3 liters of fluid. Will use 4K bath.  Jamal Maes, MD [facility] [phone] Pager 01/31/2015, 1:12 PM

## 2015-01-31 NOTE — Procedures (Signed)
Intubation Procedure Note METZTLI SACHDEV [id number] [birth year]  Procedure: Intubation Indications: Respiratory insufficiency  Procedure Details Consent: Unable to obtain consent because of emergent medical necessity. Time Out: Verified patient identification, verified procedure, site/side was marked, verified correct patient position, special equipment/implants available, medications/allergies/relevent history reviewed, required imaging and test results available.  Performed  Maximum sterile technique was used including gloves, gown, hand hygiene and mask.  MAC and 4  Versed 2 mg fent 100 mcg Etomidate 20 mg  Evaluation Hemodynamic Status: BP stable throughout; O2 sats: currently acceptable Patient's Current Condition: stable Complications: No apparent complications Patient did tolerate procedure well. Chest X-ray ordered to verify placement.  CXR: pending.   Cheralyn Oliver V. 01/31/2015

## 2015-01-31 NOTE — Progress Notes (Signed)
Pt did not tolerate HD at all. Hypotensive into the 70's and no fluid could be removed. Treatment aborted after 45 minutes.  Currently O2sats are 100% on 100% FIO2 Repeat ABG and CXR to be done. On review of Dr. Bari Mantis note would seem that maybe the issue was the LLL atelectasis since thick secretions suctioned out of the left lung and may not have been dealing with pulm edema at all.  Jamal Maes, MD [facility] [phone] Pager 01/31/2015, 5:12 PM

## 2015-01-31 NOTE — Procedures (Signed)
ELECTROENCEPHALOGRAM REPORT   Patient: [name] [name]      Room #: 29M-11 Age: 54 y.o.        Sex: female Referring Physician: Dr Doy Mince Report Date:  01/31/2015        Interpreting Physician: Hulen Luster  History: [name] is an 54 y.o. female hx of lupus admitted with AMS  Medications:  Scheduled: . antiseptic oral rinse  7 mL Mouth Rinse q12n4p  . chlorhexidine  15 mL Mouth Rinse BID  . [START ON 02/02/2015] darbepoetin (ARANESP) injection - NON-DIALYSIS  40 mcg Subcutaneous Q Fri-1800  . famotidine (PEPCID) IV  20 mg Intravenous Q24H  . folic acid  1 mg Oral Daily  . heparin subcutaneous  5,000 Units Subcutaneous 3 times per day  . hydroxychloroquine  400 mg Oral Daily  . labetalol  200 mg Per Tube 3 times per day  . multivitamin  1 tablet Oral QHS  . piperacillin-tazobactam (ZOSYN)  IV  2.25 g Intravenous Q8H  . thiamine  100 mg Oral Daily  . vancomycin  1,000 mg Intravenous Once    Conditions of Recording:  This is a 16 channel EEG carried out with the patient in the intubated sedated state.  Description:  The waking background activity consists predominantly of a low voltage, symmetrical, poorly organized, theta activity in the 5 to 7 Hz range. No posterior dominant alpha rhythm is noted.. No focal slowing or epileptiform activity is noted.   Normal sleep architecture is not observed. Hyperventilation and photic stimulation was not performed.    IMPRESSION: This is an abnormal EEG secondary to general background slowing.  This finding may be seen with a diffuse disturbance that is etiologically nonspecific, but may include a metabolic encephalopathy or polypharmacy, among other possibilities.  No epileptiform activity was noted.     Jim Like, DO [facility] [phone]  If 7pm- 7am, please page neurology on call as listed in [place]. 01/31/2015, 4:23 PM

## 2015-01-31 NOTE — Progress Notes (Signed)
She developed hypertension, tachycardia and then sudden respiratory distress with severe oxygen desaturation to 60%. She was emergently intubated after bagging, to bring saturations up to 92%. Copious frothy secretions suctioned out of ET tube, bilateral crackles noted. Of note echo in 01/2015 showed EF 45%, with hypokinesis of septum. With presumptive diagnosis of acute pulmonary edema, renal was called for hemodialysis. Chest x-ray surprisingly showed left lower lobe atelectasis. Emergent bronchoscopy performed-thick purulent secretions suctioned out of the left lung. ABG/chest x-ray reviewed for hardware position Blood pressure dropped after meds for sedation and intubation. MRI and EEG planned by neuro  Mother updated about status  Additional critical care time x 30 m  Rigoberto Noel. MD

## 2015-01-31 NOTE — Progress Notes (Signed)
PULMONARY / CRITICAL CARE MEDICINE   Name: [name] [name] MRN: [id number] DOB: November 25, 1960    ADMISSION DATE:  01/18/2015 CONSULTATION DATE:  3/20  REFERRING MD :  Eliseo Squires   CHIEF COMPLAINT:  Tachycardia, resp failure   INITIAL PRESENTATION:  54 y/o female with hx lupus (on cellcept, plaquenil) and associated CKD, DM, HTN initially presented 3/17 with vague c/o SOB, diarrhea, malaise, weakness, cough.  She was admitted by [facility] and r/o for flu.  Was undergoing further w/u for diarrhea, mildly elevated troponin and anemia.  Overnight 3/19 developed intermittent SVT, then persistent tachycardia with HR 150's and worsening SOB and hypoxia requiring NRB.  PCCM consulted 3/20.  She required ETT/MV 3/20. CXR with B infiltrates. Initiated CVVHD on 3/21 pm.   STUDIES:  CXR 3/20>>> neg  2D echo 3/19>>> GP49-82%, grade 1 diastolic dysfunction, mod AR, mod MR, mod TR. No pericardial effusion.  LE doppler US 3/20 >> negative   SIGNIFICANT EVENTS: 3/21  Adenosine challenge >> appears to be sinus rhythm with some non-conducted p-waves 3/23  Tolerated CVVHD, I/O even last 24 h, tachycardia improved, fent + propofol 3/24  High residuals overnight, on max dose fentanyl.  Rx'd with Reglan  3/26  Continues to have agitation with WUA / SBT, changed back to propofol/fentanyl  3/28 seroquel trance, neuro consult 3/29 more awake, follows commands  SUBJECTIVE: afebrile - tachy  -no obvious pain Trance like appearance  VITAL SIGNS: Temp:  [97.2 F (36.2 C)-98.6 F (37 C)] 98 F (36.7 C) (03/30 0700) Pulse Rate:  [91-138] 114 (03/30 0630) Resp:  [23-36] 27 (03/30 0630) BP: (111-177)/(58-86) 142/72 mmHg (03/30 0630) SpO2:  [96 %-100 %] 96 % (03/30 0630) Weight:  [50.5 kg (111 lb 5.3 oz)] 50.5 kg (111 lb 5.3 oz) (03/30 0528)   VENTILATOR SETTINGS:     INTAKE / OUTPUT:  Intake/Output Summary (Last 24 hours) at 01/31/15 1016 Last data filed at 01/31/15 0700  Gross per 24 hour  Intake    220 ml   Output      0 ml  Net    220 ml    PHYSICAL EXAMINATION: General:  Chronically ill appearing female Neuro:   follows commands with flicker movement in toes , trance like - tracks HEENT:  MM dry Cardiovascular:  s1s2 tachy  Lungs:  Clear bilaterally   Abdomen:  round, mildly distended, soft, BSx4 active   Musculoskeletal:  Warm and dry, no edema  LABS:  CBC  Recent Labs Lab 01/28/15 0345 01/29/15 0506 01/31/15 0535  WBC 9.9 9.6 14.7*  HGB 9.6* 10.1* 10.9*  HCT 30.3* 31.9* 35.9*  PLT 278 365 458*   Coag's  Recent Labs Lab 01/28/15 0345 01/29/15 0506 01/30/15 0551  APTT 144* >200* 136*    BMET  Recent Labs Lab 01/30/15 0551 01/30/15 1600 01/31/15 0535  NA 137 137 141  K 3.8 3.8 4.1  CL 99 103 105  CO2 25 28 25   BUN 14 22 29*  CREATININE 1.61* 2.12* 3.31*  GLUCOSE 152* 131* 135*   Electrolytes  Recent Labs Lab 01/28/15 0345  01/29/15 0506  01/30/15 0551 01/30/15 1600 01/31/15 0535  CALCIUM 9.6  < > 10.5  < > 10.4 10.3 10.9*  MG 2.3  --  2.6*  --  2.7*  --   --   PHOS 1.8*  < > 4.0  < > 3.3 3.8 5.4*  < > = values in this interval not displayed. Sepsis Markers  Recent Labs Lab 01/26/15 0355  01/27/15 0445 01/28/15 0345  PROCALCITON 1.09 0.35 0.26     ABG No results for input(s): PHART, PCO2ART, PO2ART in the last 168 hours. Liver Enzymes  Recent Labs Lab 01/29/15 1520 01/30/15 0551 01/30/15 1600 01/31/15 0535  AST 49*  --   --   --   ALT 18  --   --   --   ALKPHOS 108  --   --   --   BILITOT 0.8  --   --   --   ALBUMIN 2.6*  2.7* 2.7* 2.7* 2.9*     Cardiac Enzymes No results for input(s): TROPONINI, PROBNP in the last 168 hours. Glucose  Recent Labs Lab 01/30/15 1114 01/30/15 1627 01/30/15 2006 01/30/15 2352 01/31/15 0433 01/31/15 0736  GLUCAP 165* 117* 129* 138* 126* 144*    Imaging No results found.  ASSESSMENT / PLAN:  PULMONARY Acute hypoxic respiratory failure - unknown etiology but in setting B  infiltrates.  -treated for HCAP, PE (d/c'd heparin 3/23). Consider acute pulmonary edema in setting HTN and renal failure-  infiltrates have completely cleared. Most likely cause is cardiogenic edema.  Mental status is the issue with extubation at this point.   P:   Daily WUA / SBT with goal extubation now that agitation/HR controlled    CARDIOVASCULAR SVT/sinus tachycardia - HR was 150 at time of her acute decompensation, Adenosine given, appeared to be sinus with some non-conducted p-waves. Presumed reactive to critical illness Hypotension Mildly elevated troponin peak 0.74 dCHF - new.  EF 40-45% with MOD aortic, tricuspid and mitral regurg.  ??PE - BLE venous dopplers prelim negative; doubt PE (heparin gtt stopped 3/23) P:  Dc Hydralazine PRN  Resume labetalol 200 mg tID (reduced home dose) -via NGT Hold home clonidine Use lopressor prn for HR 130 & above  RENAL Acute on chronic renal failure, presume lupus nephritis.  Hyponatremia  Metabolic acidosis P:   Appreciate renal service's assistance OK to transition to iHD - she is not expected to recover her renal fxn, will need permacath - can plan for 3/31  GASTROINTESTINAL Diarrhea - initial resolved.  New with reglan 3/26, flexi seal placed.  Abd pain - neg abd xray  High Gastric Residuals Protein Calorie Malnutrition  P:   resume TF  dc'd reglan  Flexi-seal 3/26 Pepcid    HEMATOLOGIC Leukocytosis  Anemia - s/p 1 unit PRBC 3/22 P:  Cont iron    INFECTIOUS Fever - resolved  Immunocompromised Host Diarrhea - resolved Unclear etiology of infection, consider PNA in immunocompromised host but CXR has quickly cleared.  ??GI given diarrhea v viral v other source (ie lupus pneumonitis).  Immunocompromised on rx for lupus.  P:   BCx2 3/20 >>>neg UC 3/20 >>> negative  Vanc 3/20 (empiric) >>> 3/22 Zosyn 3/20 (empiric) >>>3/28  C diff cancelled as no further diarrhea Dc Empiric broad spectrum abx   resume cellcept &  plaquenil   ENDOCRINE No active issue    P:   Monitor glucose on chem   NEUROLOGIC Acute encephalopathy -fluctuating Agitation / Delirium - Head Ct neg 3/27 P:   RASS goal: 0  Avoid sedation Proceed with MRI/ EEG per neuro   FAMILY  - Updates: Mother & sister updated 3/29   Summary - mental status main issue here -  extubated, do we have to consider lupus cerebritis?  The patient is critically ill with multiple organ systems failure and requires high complexity decision making for assessment and support, frequent evaluation and titration  of therapies, application of advanced monitoring technologies and extensive interpretation of multiple databases. Critical Care Time devoted to patient care services described in this note independent of APP time is 31 minutes.    Kara Mead MD. Shade Flood. [facility] Pager [phone] If no response call [phone]    01/31/2015, 10:16 AM

## 2015-01-31 NOTE — Procedures (Signed)
Bronchoscopy Procedure Note [name] [name] [id number] 02-07-1961  Procedure: Bronchoscopy Indications: Diagnostic evaluation of the airways, Obtain specimens for culture and/or other diagnostic studies and Remove secretions  Procedure Details Consent: Risks of procedure as well as the alternatives and risks of each were explained to the (patient/caregiver).  Consent for procedure obtained. Time Out: Verified patient identification, verified procedure, site/side was marked, verified correct patient position, special equipment/implants available, medications/allergies/relevent history reviewed, required imaging and test results available.  Performed  In preparation for procedure, patient was given 100% FiO2 and bronchoscope lubricated. Sedation: fentanyl 100 mcg  Airway entered and the following bronchi were examined: Bronchi.   Procedures performed: BAL performed, Purulent secretions suctioned from LLL Bronchoscope removed.  , Patient placed back on 100% FiO2 at conclusion of procedure.    Evaluation Hemodynamic Status: BP stable throughout; O2 sats: stable throughout Patient's Current Condition: stable Specimens:  Sent purulent fluid Complications: No apparent complications Patient did tolerate procedure well.   Rigoberto Noel MD 01/31/2015

## 2015-01-31 NOTE — Progress Notes (Signed)
EEG Completed; Results Pending  

## 2015-01-31 NOTE — Progress Notes (Signed)
Rehab Admissions Coordinator Note:  Patient was screened by Varonica Siharath L for appropriateness for an Inpatient Acute Rehab Consult.  At this time, we are recommending Inpatient Rehab consult.  Telitha Plath L 01/31/2015, 12:51 PM  I can be reached at [phone].

## 2015-01-31 NOTE — Progress Notes (Signed)
Subjective: Patient less interactive today.  Extubated.    Objective: Current vital signs: BP 73/55 mmHg  Pulse 118  Temp(Src) 98 F (36.7 C) (Axillary)  Resp 18  Wt 50.5 kg (111 lb 5.3 oz)  SpO2 100%  LMP 07/18/2012 Vital signs in last 24 hours: Temp:  [97.5 F (36.4 C)-98.6 F (37 C)] 98 F (36.7 C) (03/30 1529) Pulse Rate:  [91-154] 118 (03/30 1620) Resp:  [16-41] 18 (03/30 1620) BP: (73-202)/(55-94) 73/55 mmHg (03/30 1620) SpO2:  [86 %-100 %] 100 % (03/30 1620) FiO2 (%):  [100 %] 100 % (03/30 1540) Weight:  [50.5 kg (111 lb 5.3 oz)] 50.5 kg (111 lb 5.3 oz) (03/30 0528)  Intake/Output from previous day: 03/29 0701 - 03/30 0700 In: 385 [I.V.:200; NG/GT:135; IV Piggyback:50] Out: 158  Intake/Output this shift: Total I/O In: 80 [I.V.:80] Out: -87  Nutritional status: Diet NPO time specified  Neurologic Exam: Mental Status: Extubated.  Alert.  Does not follow commands for the most part.   Cranial Nerves: II: Discs flat bilaterally; Visual fields grossly normal, pupils equal, round, reactive to light and accommodation III,IV, VI: ptosis not present, extra-ocular motions intact bilaterally with patient tracking V,VII: Corneals intact bilaterally VIII: hearing normal bilaterally IX,X: gag reflex decreased XI: shoulder shrug not tested XII: patient not following commands Motor: Moving upper extremities spontaneously.  Moves toes to command  Deep Tendon Reflexes: 2+ with absent AJ's bilaterally Plantars: Right: downgoingLeft: downgoing   Lab Results: Basic Metabolic Panel:  Recent Labs Lab 01/27/15 0400  01/27/15 1515 01/28/15 0345  01/29/15 0506 01/29/15 1520 01/30/15 0551 01/30/15 1600 01/31/15 0535  NA  --   < > 136 137  < > 137 137 137 137 141  K  --   < > 3.6 3.7  < > 3.6 4.5 3.8 3.8 4.1  CL  --   < > 100 100  < > 96 100 99 103 105  CO2  --   < > 25 27  < > 26 24 25 28 25   GLUCOSE  --   < > 127* 110*  < > 128* 144*  152* 131* 135*  BUN  --   < > 14 15  < > 12 14 14 22  29*  CREATININE  --   < > 1.71* 1.61*  < > 1.61* 1.83* 1.61* 2.12* 3.31*  CALCIUM  --   < > 9.1 9.6  < > 10.5 9.9 10.4 10.3 10.9*  MG 2.2  --  2.4 2.3  --  2.6*  --  2.7*  --   --   PHOS 2.2*  < > 2.4  2.4 1.8*  < > 4.0 4.7* 3.3 3.8 5.4*  < > = values in this interval not displayed.  Liver Function Tests:  Recent Labs Lab 01/29/15 0506 01/29/15 1520 01/30/15 0551 01/30/15 1600 01/31/15 0535  AST  --  49*  --   --   --   ALT  --  18  --   --   --   ALKPHOS  --  108  --   --   --   BILITOT  --  0.8  --   --   --   PROT  --  7.0  --   --   --   ALBUMIN 2.6* 2.6*  2.7* 2.7* 2.7* 2.9*   No results for input(s): LIPASE, AMYLASE in the last 168 hours.  Recent Labs Lab 01/28/15 0950  AMMONIA 21    CBC:  Recent Labs Lab 01/26/15 0355 01/27/15 0400 01/28/15 0345 01/29/15 0506 01/31/15 0535  WBC 7.8 8.6 9.9 9.6 14.7*  HGB 9.1* 9.2* 9.6* 10.1* 10.9*  HCT 27.8* 29.0* 30.3* 31.9* 35.9*  MCV 84.2 86.1 86.8 88.4 91.8  PLT 215 226 278 365 458*    Cardiac Enzymes: No results for input(s): CKTOTAL, CKMB, CKMBINDEX, TROPONINI in the last 168 hours.  Lipid Panel:  Recent Labs Lab 01/27/15 1541  TRIG 234*    CBG:  Recent Labs Lab 01/30/15 2006 01/30/15 2352 01/31/15 0433 01/31/15 0736 01/31/15 1602  GLUCAP 129* 138* 126* 144* 113*    Microbiology: Results for orders placed or performed during the hospital encounter of 01/18/15  MRSA PCR Screening     Status: None   Collection Time: 01/21/15 10:04 AM  Result Value Ref Range Status   MRSA by PCR NEGATIVE NEGATIVE Final    Comment:        The GeneXpert MRSA Assay (FDA approved for NASAL specimens only), is one component of a comprehensive MRSA colonization surveillance program. It is not intended to diagnose MRSA infection nor to guide or monitor treatment for MRSA infections.   Culture, blood (routine x 2)     Status: None   Collection Time:  01/21/15 11:30 AM  Result Value Ref Range Status   Specimen Description BLOOD LEFT ASSIST CONTROL  Final   Special Requests BOTTLES DRAWN AEROBIC ONLY 8CC  Final   Culture   Final    NO GROWTH 5 DAYS Performed at [facility]    Report Status 01/27/2015 FINAL  Final  Culture, Urine     Status: None   Collection Time: 01/21/15  4:24 PM  Result Value Ref Range Status   Specimen Description URINE, CATHETERIZED  Final   Special Requests Immunocompromised  Final   Colony Count NO GROWTH Performed at [facility]   Final   Culture NO GROWTH Performed at [facility]   Final   Report Status 01/22/2015 FINAL  Final  Clostridium Difficile by PCR     Status: None   Collection Time: 01/27/15  9:38 AM  Result Value Ref Range Status   C difficile by pcr NEGATIVE NEGATIVE Final    Coagulation Studies: No results for input(s): LABPROT, INR in the last 72 hours.  Imaging: Dg Abd 1 View  01/31/2015   CLINICAL DATA:  Feeding tube placement.  EXAM: ABDOMEN - 1 VIEW  COMPARISON:  None.  FINDINGS: Feeding tube has been repositioned. Feeding tube tip is no longer in the esophagus. Feeding tube appears be in the stomach with tip projected over the upper fundus.  IMPRESSION: Feeding tube repositioning. Feeding tube tip is no longer in the esophagus. The distal tip appears to be now in the upper fundus. More distal placement may prove useful. These results will be called to the ordering clinician or representative by the Radiologist Assistant, and communication documented in the PACS or zVision Dashboard.   Electronically Signed   By: Marcello Moores  Register   On: 01/31/2015 13:54   Dg Chest Port 1 View  01/31/2015   CLINICAL DATA:  Respiratory difficulty, collagen vascular disease, chronic kidney disease  EXAM: PORTABLE CHEST - 1 VIEW  COMPARISON:  Portable chest x-ray of 01/31/2015 and 01/29/2015  FINDINGS: Since the chest x-ray of 01/29/2015 the patient has developed opacity within the  left lung base suspicious for left lower lobe pneumonia and possibly atelectasis. There may be a small left pleural effusion present. The right  lung is clear. Right central venous line tip is unchanged in position. The tip of the endotracheal tube appears to be approximately 6.4 cm above the carina. Left IJ central venous line tip overlies the mid upper SVC. Heart size is stable.  IMPRESSION: 1. Left basilar opacity consistent with atelectasis, pneumonia, and possibly a small left pleural effusion. Two views of the chest would be helpful if possible. 2. Tip of endotracheal tube approximately 6.4 cm above the carina.   Electronically Signed   By: Ivar Drape M.D.   On: 01/31/2015 16:50   Portable Chest Xray  01/31/2015   CLINICAL DATA:  Acute respiratory failure ; status post intubation of the trachea.  EXAM: PORTABLE CHEST - 1 VIEW  COMPARISON:  Portable chest x-ray of January 29, 2015  FINDINGS: The endotracheal tube tip lies approximately 6.4 cm above the carina. The Dobhoff type feeding tube tip enters the stomach and the Hg fill bulb lies in the region of the gastric cardia. The right internal jugular Cordis sheath tip projects over the proximal portion of the SVC. The left internal jugular venous catheter tip projects over the proximal SVC as well. There is no postprocedure pneumothorax. There is dense consolidation of the left lower lobe with obscuration of the hemidiaphragm. The cardiac silhouette and pulmonary vascularity are normal. There is no right pleural effusion. There is no pneumothorax.  IMPRESSION: 1. New dense infiltrate and/or pleural effusion in the left lower hemithorax. 2. Appropriate positioning of the endotracheal tube and other support tubes. Distal migration of the Dobhoff tube is needed prior to feeding.   Electronically Signed   By: David  Martinique   On: 01/31/2015 13:49   Dg Abd Portable 1v  01/31/2015   CLINICAL DATA:  Assess feeding tube position following a attempts at repositioning   EXAM: PORTABLE ABDOMEN - 1 VIEW  COMPARISON:  Abdominal films of earlier today  FINDINGS: The feeding tube tip projects in the region of the proximal gastric fundus somewhat lower than on the previous study. A moderate amount of tubing is coiled within the stomach. There is persistent left lower lobe atelectasis or pneumonia.  IMPRESSION: Slight interval improvement in the positioning of the feeding tube with the radiodense bulb lying in the region of the proximal gastric body. Advancement is needed prior to feeding tube use.   Electronically Signed   By: David  Martinique   On: 01/31/2015 16:52   Dg Abd Portable 1v  01/31/2015   CLINICAL DATA:  Check feeding tube positioning.  EXAM: PORTABLE ABDOMEN - 1 VIEW  COMPARISON:  01/21/2015  FINDINGS: The feeding tube is coiled in the stomach, with tip in the distal esophagus.  Rectal tube present.  No bowel obstruction.  New opacification of the retrocardiac lung.  These results were called by telephone at the time of interpretation on 01/31/2015 at 12:35 pm to floor RN, who verbally acknowledged these results.  IMPRESSION: 1. The feeding tube coils through the stomach with tip re- entering the distal esophagus. The tube needs repositioning or exchange before use. 2. Retrocardiac opacification which is new 01/21/2015.   Electronically Signed   By: Monte Fantasia M.D.   On: 01/31/2015 12:36    Medications:  I have reviewed the patient's current medications. Scheduled: . antiseptic oral rinse  7 mL Mouth Rinse q12n4p  . chlorhexidine  15 mL Mouth Rinse BID  . [START ON 02/02/2015] darbepoetin (ARANESP) injection - NON-DIALYSIS  40 mcg Subcutaneous Q Fri-1800  . famotidine (PEPCID) IV  20 mg Intravenous Q24H  . folic acid  1 mg Oral Daily  . heparin subcutaneous  5,000 Units Subcutaneous 3 times per day  . hydroxychloroquine  400 mg Oral Daily  . labetalol  200 mg Per Tube 3 times per day  . multivitamin  1 tablet Oral QHS  . piperacillin-tazobactam (ZOSYN)  IV   2.25 g Intravenous Q8H  . thiamine  100 mg Oral Daily  . vancomycin  1,000 mg Intravenous Once    Assessment/Plan: Although mental status improved on yesterday has not continued to progress.  White blood cell count increasing.  Renal function worsening.  Mental status issues may be metabolic but will rule out lupus cerebritis as well.    Recommendations; 1.  MRI of the brain without contrast 2.  EEG 3.  After results of above reviewed will determine need for LP.  Case discussed with Dr. Elsworth Soho   LOS: 12 days   Alexis Goodell, MD [facility] [phone] 01/31/2015  5:06 PM

## 2015-01-31 NOTE — Progress Notes (Signed)
Pt HD ended after 45 minutes d/t sbp not tolerating treatment. No fluid removed. MD and primary RN aware.

## 2015-01-31 NOTE — Progress Notes (Addendum)
SLP Cancellation Note  Patient Details Name: [name] [name] MRN: [id number] DOB: Oct 21, 1961   Cancelled treatment:       Reason Eval/Treat Not Completed: Medical issues which prohibited therapy. O2s dropped significantly while in room from high 80s to 50s. Provided suction but did not provide any POs. RN informed- will be re-intubating. Will reattempt when medically appropriate.   Kern Reap, [place], [name] 01/31/2015, 12:54 PM  [phone]

## 2015-01-31 NOTE — Evaluation (Signed)
Physical Therapy Evaluation Patient Details Name: [name] [name] MRN: [id number] DOB: 04-Dec-1960 Today's Date: 01/31/2015   History of Present Illness  Pt is a 54 y.o. female presents with SOB on 01/18/15. In addition she had labwork in the ED and this shows significant anemia. Patient has been having anemia for some time. She was recently in the hospital for the anemia but a workup was not conclusive. Also she had a minimally elevated troponin level. Pt required NRB on 3/19 and CVVHD was initiated on 3/21.   Clinical Impression  Pt admitted with above diagnosis. Pt currently with functional limitations due to the deficits listed below (see PT Problem List). At the time of PT eval pt was able to perform transfers to/from EOB with +2 assist. Per chart review pt was doing fairly well with therapy prior to decline in status. Pt will benefit from skilled PT to increase their independence and safety with mobility to allow discharge to the venue listed below.  Pt would benefit from a stay in CIR to improve independence with functional mobility prior to return home. Pt was at a mod I level PTA. Disposition dependent on progress with PT.     Follow Up Recommendations CIR;Supervision/Assistance - 24 hour (Pending progress with PT. May require SNF)    Equipment Recommendations  None recommended by PT    Recommendations for Other Services OT consult;Speech consult     Precautions / Restrictions Precautions Precautions: Fall Restrictions Weight Bearing Restrictions: No      Mobility  Bed Mobility Overal bed mobility: Needs Assistance;+2 for physical assistance Bed Mobility: Supine to Sit;Sit to Supine     Supine to sit: Total assist;+2 for physical assistance Sit to supine: Total assist;+2 for physical assistance   General bed mobility comments: Pt required assist for all aspects of bed mobility. Bed pad was used for scooting and positioning.   Transfers                 General  transfer comment: OOB was deferred at this time due to pt level of participation.  Ambulation/Gait                Stairs            Wheelchair Mobility    Modified Rankin (Stroke Patients Only)       Balance Overall balance assessment: Needs assistance Sitting-balance support: Feet supported;Bilateral upper extremity supported Sitting balance-Leahy Scale: Zero Sitting balance - Comments: Max assist required at times for sitting balance.  Postural control: Posterior lean;Right lateral lean;Left lateral lean                                   Pertinent Vitals/Pain Pain Assessment: Faces Faces Pain Scale: No hurt    Home Living Family/patient expects to be discharged to:: Private residence Living Arrangements: Children Available Help at Discharge: Available 24 hours/day;Family Type of Home: Apartment Home Access: Stairs to enter Entrance Stairs-Rails: Right Entrance Stairs-Number of Steps: 4 Home Layout: Two level;Able to live on main level with bedroom/bathroom Home Equipment: Gilford Rile - 2 wheels;Cane - single point;Shower seat;Bedside commode Additional Comments: Pt was previously receiving HHOT and PT    Prior Function Level of Independence: Independent with assistive device(s)         Comments: Pt reports on her bad days she uses the cane or the RW.       Hand Dominance   Dominant Hand:  Right    Extremity/Trunk Assessment   Upper Extremity Assessment: Defer to OT evaluation;Generalized weakness           Lower Extremity Assessment: Generalized weakness      Cervical / Trunk Assessment: Normal  Communication   Communication: No difficulties  Cognition Arousal/Alertness: Awake/alert Behavior During Therapy: WFL for tasks assessed/performed Overall Cognitive Status: Within Functional Limits for tasks assessed                      General Comments General comments (skin integrity, edema, etc.): Pt was able to  tolerate sitting activity for trunk stability at EOB. Pt was able to bear weight through L elbow on bed with activation of shoulder girdle musculature noted. Pt required increased assist to lean to the right and bear weight through the RUE, however pt demonstrated more resistance and weight bearing through the R compared to the L side.     Exercises        Assessment/Plan    PT Assessment Patient needs continued PT services  PT Diagnosis Generalized weakness   PT Problem List Decreased strength;Cardiopulmonary status limiting activity;Decreased activity tolerance;Decreased balance;Decreased mobility  PT Treatment Interventions DME instruction;Gait training;Stair training;Functional mobility training;Therapeutic activities;Therapeutic exercise;Neuromuscular re-education;Patient/family education   PT Goals (Current goals can be found in the Care Plan section) Acute Rehab PT Goals Patient Stated Goal: none stated PT Goal Formulation: Patient unable to participate in goal setting Time For Goal Achievement: 02/14/15 Potential to Achieve Goals: Fair    Frequency Min 2X/week   Barriers to discharge        Co-evaluation               End of Session Equipment Utilized During Treatment: Oxygen Activity Tolerance: Patient limited by lethargy Patient left: in bed;with call bell/phone within reach Nurse Communication: Mobility status         Time: 1540-0867 PT Time Calculation (min) (ACUTE ONLY): 26 min   Charges:   PT Evaluation $Initial PT Evaluation Tier I: 1 Procedure PT Treatments $Therapeutic Activity: 8-22 mins   PT G Codes:        Rolinda Roan 03/02/15, 11:19 AM   Rolinda Roan, PT, DPT Acute Rehabilitation Services Pager: [phone]

## 2015-01-31 NOTE — Progress Notes (Signed)
NUTRITION FOLLOW UP  Intervention:   Re-tart TF as soon as possible: Initiate TF via NGT with Vital 1.2 AF at 25 ml/h and Prostat 30 ml BID on day 1; on day 2, increase to goal rate of 40 ml/h (960 ml per day) and continue Pro-stat BID to provide 1352 kcals, 102 gm protein, 778 ml free water daily.  Nutrition Dx:   Inadequate oral intake related to inability to eat as evidenced by NPO status; ongoing  Goal:   Intake to meet >90% of estimated nutrition needs; currently unmet  Monitor:   TF initiation/tolerance/adequacy, weight trend, labs, vent status.  Assessment:   Patient admitted with weakness and recent weight loss. History of lupus, CKD, DM. Patient with frequent diarrhea within 45 minutes after eating since starting [employer] in November. She has lost 20 lbs in the past 3-4 months.  RD assessing pt due to pt being extubated yesterday however, pt was re-intubated this afternoon. While off vent, pt failed swallow evaluation d/t severe aspiration risk.  HD about to be initiated at time of visit. NGT in place; RN reports that NGT needs to be re-positioned and needs placement confirmed. No current orders for TF. Pt's weight has dropped 13 lbs since admission.   MV: 7.5 L/min Temp (24hrs), Avg:98.1 F (36.7 C), Min:97.5 F (36.4 C), Max:98.6 F (37 C)   Labs: elevated creatinine, high calcium, low GFR, low hemoglobin  Height: Ht Readings from Last 1 Encounters:  12/07/14 5\' 5"  (1.651 m)    Weight Status:   Wt Readings from Last 1 Encounters:  01/31/15 111 lb 5.3 oz (50.5 kg)   01/24/15 134 lb 14.7 oz (61.2 kg)   01/20/15 132 lb 11.2 oz (60.192 kg)   01/19/15 124 lb 11.2 oz (56.564 kg)               Re-estimated needs:  Kcal: 1274 Protein: 90-100 grams (up to 126 grams) Fluid: 1.9 L/day  Skin: stage II pressure ulcer on buttocks  Diet Order: Diet NPO time specified   Intake/Output Summary (Last 24 hours) at 01/31/15 1600 Last data filed at  01/31/15 1500  Gross per 24 hour  Intake    190 ml  Output      0 ml  Net    190 ml    Last BM: 01/31/15   Labs:   Recent Labs Lab 01/28/15 0345  01/29/15 0506  01/30/15 0551 01/30/15 1600 01/31/15 0535  NA 137  < > 137  < > 137 137 141  K 3.7  < > 3.6  < > 3.8 3.8 4.1  CL 100  < > 96  < > 99 103 105  CO2 27  < > 26  < > 25 28 25   BUN 15  < > 12  < > 14 22 29*  CREATININE 1.61*  < > 1.61*  < > 1.61* 2.12* 3.31*  CALCIUM 9.6  < > 10.5  < > 10.4 10.3 10.9*  MG 2.3  --  2.6*  --  2.7*  --   --   PHOS 1.8*  < > 4.0  < > 3.3 3.8 5.4*  GLUCOSE 110*  < > 128*  < > 152* 131* 135*  < > = values in this interval not displayed.  CBG (last 3)   Recent Labs  01/30/15 2352 01/31/15 0433 01/31/15 0736  GLUCAP 138* 126* 144*    Scheduled Meds: . antiseptic oral rinse  7 mL Mouth Rinse q12n4p  .  chlorhexidine  15 mL Mouth Rinse BID  . [START ON 02/02/2015] darbepoetin (ARANESP) injection - NON-DIALYSIS  40 mcg Subcutaneous Q Fri-1800  . famotidine (PEPCID) IV  20 mg Intravenous Q24H  . folic acid  1 mg Oral Daily  . heparin subcutaneous  5,000 Units Subcutaneous 3 times per day  . hydroxychloroquine  400 mg Oral Daily  . labetalol  200 mg Per Tube 3 times per day  . multivitamin  1 tablet Oral QHS  . piperacillin-tazobactam (ZOSYN)  IV  2.25 g Intravenous Q8H  . thiamine  100 mg Oral Daily  . vancomycin  1,000 mg Intravenous Once    Continuous Infusions: . dextrose 5 % and 0.9% NaCl 10 mL/hr at 01/30/15 1200  . fentaNYL infusion INTRAVENOUS    . niCARDipine      Pryor Ochoa RD, LDN Inpatient Clinical Dietitian Pager: [phone] After Hours Pager: [phone]

## 2015-02-01 ENCOUNTER — Inpatient Hospital Stay (HOSPITAL_COMMUNITY): Payer: Managed Care, Other (non HMO)

## 2015-02-01 DIAGNOSIS — G9340 Encephalopathy, unspecified: Secondary | ICD-10-CM | POA: Insufficient documentation

## 2015-02-01 LAB — RENAL FUNCTION PANEL
ANION GAP: 14 (ref 5–15)
Albumin: 2.4 g/dL — ABNORMAL LOW (ref 3.5–5.2)
BUN: 31 mg/dL — ABNORMAL HIGH (ref 6–23)
CO2: 23 mmol/L (ref 19–32)
CREATININE: 4.23 mg/dL — AB (ref 0.50–1.10)
Calcium: 10.1 mg/dL (ref 8.4–10.5)
Chloride: 105 mmol/L (ref 96–112)
GFR, EST AFRICAN AMERICAN: 13 mL/min — AB (ref >90)
GFR, EST NON AFRICAN AMERICAN: 11 mL/min — AB (ref >90)
Glucose, Bld: 141 mg/dL — ABNORMAL HIGH (ref 70–99)
Phosphorus: 4.9 mg/dL — ABNORMAL HIGH (ref 2.3–4.6)
Potassium: 3.7 mmol/L (ref 3.5–5.1)
SODIUM: 142 mmol/L (ref 135–145)

## 2015-02-01 LAB — GLUCOSE, CAPILLARY
GLUCOSE-CAPILLARY: 145 mg/dL — AB (ref 70–99)
GLUCOSE-CAPILLARY: 132 mg/dL — AB (ref 70–99)
Glucose-Capillary: 110 mg/dL — ABNORMAL HIGH (ref 70–99)
Glucose-Capillary: 130 mg/dL — ABNORMAL HIGH (ref 70–99)
Glucose-Capillary: 137 mg/dL — ABNORMAL HIGH (ref 70–99)
Glucose-Capillary: 140 mg/dL — ABNORMAL HIGH (ref 70–99)
Glucose-Capillary: 143 mg/dL — ABNORMAL HIGH (ref 70–99)

## 2015-02-01 LAB — POCT I-STAT 3, ART BLOOD GAS (G3+)
Acid-base deficit: 4 mmol/L — ABNORMAL HIGH (ref 0.0–2.0)
BICARBONATE: 22.7 meq/L (ref 20.0–24.0)
O2 SAT: 22 %
PO2 ART: 18 mmHg — AB (ref 80.0–100.0)
Patient temperature: 37
TCO2: 24 mmol/L (ref 0–100)
pCO2 arterial: 45.8 mmHg — ABNORMAL HIGH (ref 35.0–45.0)
pH, Arterial: 7.303 — ABNORMAL LOW (ref 7.350–7.450)

## 2015-02-01 LAB — GRAM STAIN

## 2015-02-01 LAB — CBC
HEMATOCRIT: 30.9 % — AB (ref 36.0–46.0)
HEMOGLOBIN: 9.5 g/dL — AB (ref 12.0–15.0)
MCH: 28.1 pg (ref 26.0–34.0)
MCHC: 30.7 g/dL (ref 30.0–36.0)
MCV: 91.4 fL (ref 78.0–100.0)
Platelets: 447 10*3/uL — ABNORMAL HIGH (ref 150–400)
RBC: 3.38 MIL/uL — AB (ref 3.87–5.11)
RDW: 18.6 % — AB (ref 11.5–15.5)
WBC: 21.1 10*3/uL — ABNORMAL HIGH (ref 4.0–10.5)

## 2015-02-01 LAB — PROTEIN AND GLUCOSE, CSF
GLUCOSE CSF: 84 mg/dL — AB (ref 43–76)
TOTAL PROTEIN, CSF: 26 mg/dL (ref 15–45)

## 2015-02-01 LAB — HEPATITIS B SURFACE ANTIGEN: Hepatitis B Surface Ag: NEGATIVE

## 2015-02-01 MED ORDER — PROPOFOL 10 MG/ML IV EMUL: 5.0000 ug/kg/min | Freq: Once | INTRAVENOUS | Status: DC

## 2015-02-01 MED ORDER — CETYLPYRIDINIUM CHLORIDE 0.05 % MT LIQD
7.0000 mL | Freq: Four times a day (QID) | OROMUCOSAL | Status: DC
  Administered 2015-02-01 – 2015-02-06 (×20): 7 mL via OROMUCOSAL

## 2015-02-01 MED ORDER — DARBEPOETIN ALFA 60 MCG/0.3ML IJ SOSY
60.0000 ug | PREFILLED_SYRINGE | INTRAMUSCULAR | Status: DC
  Filled 2015-02-01: qty 0.3

## 2015-02-01 MED ORDER — VITAL HIGH PROTEIN PO LIQD
1000.0000 mL | ORAL | Status: DC
  Administered 2015-02-01: 1000 mL
  Filled 2015-02-01 (×4): qty 1000

## 2015-02-01 MED ORDER — MIDAZOLAM HCL 2 MG/2ML IJ SOLN: 4.0000 mg | Freq: Once | INTRAMUSCULAR | Status: DC

## 2015-02-01 MED ORDER — DEXTROSE 5 % IV SOLN
2.0000 g | Freq: Once | INTRAVENOUS | Status: AC
  Administered 2015-02-01: 2 g via INTRAVENOUS
  Filled 2015-02-01: qty 2

## 2015-02-01 MED ORDER — ETOMIDATE 2 MG/ML IV SOLN
40.0000 mg | Freq: Once | INTRAVENOUS | Status: DC
  Filled 2015-02-01: qty 20

## 2015-02-01 MED ORDER — VECURONIUM BROMIDE 10 MG IV SOLR: 10.0000 mg | Freq: Once | INTRAVENOUS | Status: DC

## 2015-02-01 MED ORDER — LABETALOL HCL 100 MG PO TABS
100.0000 mg | ORAL_TABLET | Freq: Three times a day (TID) | ORAL | Status: DC
  Administered 2015-02-01 – 2015-02-02 (×2): 100 mg
  Filled 2015-02-01 (×6): qty 1

## 2015-02-01 MED ORDER — DEXTROSE 5 % IV SOLN
2.0000 g | Freq: Once | INTRAVENOUS | Status: DC
  Filled 2015-02-01: qty 2

## 2015-02-01 MED ORDER — CHLORHEXIDINE GLUCONATE 0.12 % MT SOLN
15.0000 mL | Freq: Two times a day (BID) | OROMUCOSAL | Status: DC
Start: 2015-02-01 — End: 2015-02-06
  Administered 2015-02-01 – 2015-02-06 (×11): 15 mL via OROMUCOSAL
  Filled 2015-02-01 (×6): qty 15

## 2015-02-01 MED ORDER — FENTANYL CITRATE 0.05 MG/ML IJ SOLN: 200.0000 ug | Freq: Once | INTRAMUSCULAR | Status: DC

## 2015-02-01 NOTE — Progress Notes (Signed)
UR Completed.  [phone]  

## 2015-02-01 NOTE — Procedures (Signed)
Lumbar Puncture Procedure Note  Pre-operative Diagnosis: Acute encephalopathy & lupus  Post-operative Diagnosis: Acute encephalopathy & lupus  Indications: Diagnostic  Procedure Details   Consent: Informed consent was obtained. Risks of the procedure were discussed including: infection, bleeding, pain and headache.  The patient was positioned under sterile conditions. Betadine solution and sterile drapes were utilized. A spinal needle was inserted at the L1 - L2 interspace.  Spinal fluid was obtained and sent to the laboratory.  Findings 66mL of clear spinal fluid was obtained.  Complications:  None; patient tolerated the procedure well.        Condition: stable  Plan Bed rest for 4 hours. Tylenol 650 mg for pain.   Rigoberto Noel MD

## 2015-02-01 NOTE — Progress Notes (Signed)
Subjective: Patient now reintubated.  Has not returned to baseline mental status but is following commands.     Objective: Current vital signs: BP 99/68 mmHg  Pulse 114  Temp(Src) 98.3 F (36.8 C) (Oral)  Resp 16  Wt 51.2 kg (112 lb 14 oz)  SpO2 100%  LMP 07/18/2012 Vital signs in last 24 hours: Temp:  [98 F (36.7 C)-99.2 F (37.3 C)] 98.3 F (36.8 C) (03/31 0700) Pulse Rate:  [78-154] 114 (03/31 0900) Resp:  [16-41] 16 (03/31 0900) BP: (73-202)/(55-89) 99/68 mmHg (03/31 1000) SpO2:  [86 %-100 %] 100 % (03/31 0900) FiO2 (%):  [40 %-100 %] 40 % (03/31 1000) Weight:  [51.2 kg (112 lb 14 oz)] 51.2 kg (112 lb 14 oz) (03/31 0200)  Intake/Output from previous day: 03/30 0701 - 03/31 0700 In: 574.1 [I.V.:329.1; NG/GT:90; IV Piggyback:100] Out: 113 [Stool:200] Intake/Output this shift: Total I/O In: 105 [I.V.:45; Other:10; IV Piggyback:50] Out: -  Nutritional status: Diet NPO time specified  Neurologic Exam: Mental Status: Intubated.  Eyes looking forward.  Does not track.   Cranial Nerves: II: Discs flat bilaterally; Visual fields grossly normal, pupils equal, round, reactive to light and accommodation III,IV, VI: ptosis not present, oculocephalic maneuver intact  V,VII: Corneals intact bilaterally VIII: hearing normal bilaterally IX,X: gag reflex decreased XI: shoulder shrug not tested XII: patient not following commands Motor: Moving upper extremities spontaneously and to command. Moves toes to command  Deep Tendon Reflexes: 2+ with absent AJ's bilaterally Plantars: Right: downgoingLeft: downgoing  Lab Results: Basic Metabolic Panel:  Recent Labs Lab 01/27/15 0400  01/27/15 1515 01/28/15 0345  01/29/15 0506 01/29/15 1520 01/30/15 0551 01/30/15 1600 01/31/15 0535 02/01/15 0445  NA  --   < > 136 137  < > 137 137 137 137 141 142  K  --   < > 3.6 3.7  < > 3.6 4.5 3.8 3.8 4.1 3.7  CL  --   < > 100 100  < > 96 100 99 103 105  105  CO2  --   < > 25 27  < > 26 24 25 28 25 23   GLUCOSE  --   < > 127* 110*  < > 128* 144* 152* 131* 135* 141*  BUN  --   < > 14 15  < > 12 14 14 22  29* 31*  CREATININE  --   < > 1.71* 1.61*  < > 1.61* 1.83* 1.61* 2.12* 3.31* 4.23*  CALCIUM  --   < > 9.1 9.6  < > 10.5 9.9 10.4 10.3 10.9* 10.1  MG 2.2  --  2.4 2.3  --  2.6*  --  2.7*  --   --   --   PHOS 2.2*  < > 2.4  2.4 1.8*  < > 4.0 4.7* 3.3 3.8 5.4* 4.9*  < > = values in this interval not displayed.  Liver Function Tests:  Recent Labs Lab 01/29/15 1520 01/30/15 0551 01/30/15 1600 01/31/15 0535 02/01/15 0445  AST 49*  --   --   --   --   ALT 18  --   --   --   --   ALKPHOS 108  --   --   --   --   BILITOT 0.8  --   --   --   --   PROT 7.0  --   --   --   --   ALBUMIN 2.6*  2.7* 2.7* 2.7* 2.9* 2.4*   No  results for input(s): LIPASE, AMYLASE in the last 168 hours.  Recent Labs Lab 01/28/15 0950  AMMONIA 21    CBC:  Recent Labs Lab 01/27/15 0400 01/28/15 0345 01/29/15 0506 01/31/15 0535 02/01/15 0445  WBC 8.6 9.9 9.6 14.7* 21.1*  HGB 9.2* 9.6* 10.1* 10.9* 9.5*  HCT 29.0* 30.3* 31.9* 35.9* 30.9*  MCV 86.1 86.8 88.4 91.8 91.4  PLT 226 278 365 458* 447*    Cardiac Enzymes: No results for input(s): CKTOTAL, CKMB, CKMBINDEX, TROPONINI in the last 168 hours.  Lipid Panel:  Recent Labs Lab 01/27/15 1541  TRIG 234*    CBG:  Recent Labs Lab 01/31/15 1602 01/31/15 2043 02/01/15 0023 02/01/15 0404 02/01/15 0739  GLUCAP 113* 115* 137* 140* 143*    Microbiology: Results for orders placed or performed during the hospital encounter of 01/18/15  MRSA PCR Screening     Status: None   Collection Time: 01/21/15 10:04 AM  Result Value Ref Range Status   MRSA by PCR NEGATIVE NEGATIVE Final    Comment:        The GeneXpert MRSA Assay (FDA approved for NASAL specimens only), is one component of a comprehensive MRSA colonization surveillance program. It is not intended to diagnose MRSA infection nor to  guide or monitor treatment for MRSA infections.   Culture, blood (routine x 2)     Status: None   Collection Time: 01/21/15 11:30 AM  Result Value Ref Range Status   Specimen Description BLOOD LEFT ASSIST CONTROL  Final   Special Requests BOTTLES DRAWN AEROBIC ONLY 8CC  Final   Culture   Final    NO GROWTH 5 DAYS Performed at [facility]    Report Status 01/27/2015 FINAL  Final  Culture, Urine     Status: None   Collection Time: 01/21/15  4:24 PM  Result Value Ref Range Status   Specimen Description URINE, CATHETERIZED  Final   Special Requests Immunocompromised  Final   Colony Count NO GROWTH Performed at [facility]   Final   Culture NO GROWTH Performed at [facility]   Final   Report Status 01/22/2015 FINAL  Final  Clostridium Difficile by PCR     Status: None   Collection Time: 01/27/15  9:38 AM  Result Value Ref Range Status   C difficile by pcr NEGATIVE NEGATIVE Final  Culture, bal-quantitative     Status: None (Preliminary result)   Collection Time: 01/31/15  3:11 PM  Result Value Ref Range Status   Specimen Description BRONCHIAL ALVEOLAR LAVAGE  Final   Special Requests Immunocompromised  Final   Gram Stain   Final    FEW WBC PRESENT,BOTH PMN AND MONONUCLEAR RARE SQUAMOUS EPITHELIAL CELLS PRESENT NO ORGANISMS SEEN Performed at [facility] Count PENDING  Incomplete   Culture PENDING  Incomplete   Report Status PENDING  Incomplete    Coagulation Studies: No results for input(s): LABPROT, INR in the last 72 hours.  Imaging: Dg Abd 1 View  01/31/2015   CLINICAL DATA:  Feeding tube placement.  EXAM: ABDOMEN - 1 VIEW  COMPARISON:  None.  FINDINGS: Feeding tube has been repositioned. Feeding tube tip is no longer in the esophagus. Feeding tube appears be in the stomach with tip projected over the upper fundus.  IMPRESSION: Feeding tube repositioning. Feeding tube tip is no longer in the esophagus. The distal tip appears  to be now in the upper fundus. More distal placement may prove useful. These  results will be called to the ordering clinician or representative by the Radiologist Assistant, and communication documented in the PACS or zVision Dashboard.   Electronically Signed   By: Marcello Moores  Register   On: 01/31/2015 13:54   Mr Brain Wo Contrast  02/01/2015   CLINICAL DATA:  Altered mental status, not improving. Worsening renal function, suspected metabolic encephalopathy. Assess for lupus cerebritis. History of stroke, lupus, diabetes, metabolic acidosis.  EXAM: MRI HEAD WITHOUT CONTRAST  TECHNIQUE: Multiplanar, multiecho pulse sequences of the brain and surrounding structures were obtained without intravenous contrast.  COMPARISON:  CT of the head January 28, 2015 and MRI of the brain October 11, 2014  FINDINGS: No reduced diffusion to suggest acute ischemia. Focal susceptibility in RIGHT inferior brainstem corresponding to known prior hemorrhage. Stable small focus of susceptibility artifact in RIGHT mesial caudate. No new areas of hemorrhage. No midline shift or mass effect.  Mild ventriculomegaly, likely on the basis of global parenchymal brain volume loss as there is overall commensurate enlargement of cerebral sulci and cerebellar folia, advanced for age. Tiny remote RIGHT cerebellar infarct. Mild white matter changes appear improved, including pontine T2 hyperintensities without midline shift, mass effect or mass lesions.  No abnormal extra-axial fluid collections. Normal major intracranial vascular flow voids seen at the skull base.  Bilateral mastoid effusions. Mild paranasal sinus mucosal thickening with layering secretions in the naso and oropharynx, nasogastric tube in place. Ocular globes and orbital contents are unremarkable though not tailored for evaluation. No abnormal sellar expansion. No cerebellar tonsillar ectopia. No suspicious calvarial bone marrow signal.  IMPRESSION: No acute intracranial process.  Remote  RIGHT inferior brainstem hemorrhage, stable RIGHT basal ganglia micro hemorrhage, no new hemorrhage.  Mild parenchymal brain volume loss, advanced for age. Mild decreased white matter changes (which could be technical) and may reflect chronic small vessel ischemic disease, nonspecific, advanced for age . Remote small RIGHT inferior cerebellar infarct.  Mastoid effusions, mild paranasal sinusitis with life-support lines in place.   Electronically Signed   By: Elon Alas   On: 02/01/2015 00:32   Portable Chest Xray  02/01/2015   CLINICAL DATA:  Respiratory failure.  EXAM: PORTABLE CHEST - 1 VIEW  COMPARISON:  01/31/2015 .  FINDINGS: Endotracheal tube, bilateral IJ lines, feeding tube in stable position. Heart size pulmonary vascularity stable. Persistent left lower lobe atelectasis and/or infiltrate. Small left pleural effusion cannot be excluded. No pneumothorax. No acute osseous abnormality.  IMPRESSION: 1. Lines and tubes in stable position. 2. Persistent left lower lobe atelectasis and/or infiltrate. Small left pleural effusion cannot be excluded.   Electronically Signed   By: Marcello Moores  Register   On: 02/01/2015 07:30   Dg Chest Port 1 View  01/31/2015   CLINICAL DATA:  Respiratory difficulty, collagen vascular disease, chronic kidney disease  EXAM: PORTABLE CHEST - 1 VIEW  COMPARISON:  Portable chest x-ray of 01/31/2015 and 01/29/2015  FINDINGS: Since the chest x-ray of 01/29/2015 the patient has developed opacity within the left lung base suspicious for left lower lobe pneumonia and possibly atelectasis. There may be a small left pleural effusion present. The right lung is clear. Right central venous line tip is unchanged in position. The tip of the endotracheal tube appears to be approximately 6.4 cm above the carina. Left IJ central venous line tip overlies the mid upper SVC. Heart size is stable.  IMPRESSION: 1. Left basilar opacity consistent with atelectasis, pneumonia, and possibly a small left  pleural effusion. Two views of the chest would  be helpful if possible. 2. Tip of endotracheal tube approximately 6.4 cm above the carina.   Electronically Signed   By: Ivar Drape M.D.   On: 01/31/2015 16:50   Portable Chest Xray  01/31/2015   CLINICAL DATA:  Acute respiratory failure ; status post intubation of the trachea.  EXAM: PORTABLE CHEST - 1 VIEW  COMPARISON:  Portable chest x-ray of January 29, 2015  FINDINGS: The endotracheal tube tip lies approximately 6.4 cm above the carina. The Dobhoff type feeding tube tip enters the stomach and the Hg fill bulb lies in the region of the gastric cardia. The right internal jugular Cordis sheath tip projects over the proximal portion of the SVC. The left internal jugular venous catheter tip projects over the proximal SVC as well. There is no postprocedure pneumothorax. There is dense consolidation of the left lower lobe with obscuration of the hemidiaphragm. The cardiac silhouette and pulmonary vascularity are normal. There is no right pleural effusion. There is no pneumothorax.  IMPRESSION: 1. New dense infiltrate and/or pleural effusion in the left lower hemithorax. 2. Appropriate positioning of the endotracheal tube and other support tubes. Distal migration of the Dobhoff tube is needed prior to feeding.   Electronically Signed   By: David  Martinique   On: 01/31/2015 13:49   Dg Abd Portable 1v  01/31/2015   CLINICAL DATA:  Feeding tube placement  EXAM: PORTABLE ABDOMEN - 1 VIEW  COMPARISON:  Plain film 01/31/2015  FINDINGS: Feeding tube with weighted tip in the gastric fundus. Interval removal of stylette. Left lower lobe atelectasis versus pneumonia.  IMPRESSION: Feeding tube with tip in the gastric fundus / proximal body.   Electronically Signed   By: Suzy Bouchard M.D.   On: 01/31/2015 20:58   Dg Abd Portable 1v  01/31/2015   CLINICAL DATA:  Assess feeding tube position following a attempts at repositioning  EXAM: PORTABLE ABDOMEN - 1 VIEW  COMPARISON:   Abdominal films of earlier today  FINDINGS: The feeding tube tip projects in the region of the proximal gastric fundus somewhat lower than on the previous study. A moderate amount of tubing is coiled within the stomach. There is persistent left lower lobe atelectasis or pneumonia.  IMPRESSION: Slight interval improvement in the positioning of the feeding tube with the radiodense bulb lying in the region of the proximal gastric body. Advancement is needed prior to feeding tube use.   Electronically Signed   By: David  Martin[place]   On: 01/31/2015 16:52   Dg Abd Portable 1v  01/31/2015   CLINICAL DATA:  Check feeding tube positioning.  EXAM: PORTABLE ABDOMEN - 1 VIEW  COMPARISON:  01/21/2015  FINDINGS: The feeding tube is coiled in the stomach, with tip in the distal esophagus.  Rectal tube present.  No bowel obstruction.  New opacification of the retrocardiac lung.  These results were called by telephone at the time of interpretation on 01/31/2015 at 12:35 pm to floor RN, who verbally acknowledged these results.  IMPRESSION: 1. The feeding tube coils through the stomach with tip re- entering the distal esophagus. The tube needs repositioning or exchange before use. 2. Retrocardiac opacification which is new 01/21/2015.   Electronically Signed   By: Monte Fantasia M.D.   On: 01/31/2015 12:36    Medications:  I have reviewed the patient's current medications. Scheduled: . antiseptic oral rinse  7 mL Mouth Rinse QID  . chlorhexidine  15 mL Mouth Rinse BID  . [START ON 02/02/2015] darbepoetin (ARANESP) injection -  NON-DIALYSIS  60 mcg Subcutaneous Q Fri-1800  . famotidine (PEPCID) IV  20 mg Intravenous Q24H  . folic acid  1 mg Oral Daily  . heparin subcutaneous  5,000 Units Subcutaneous 3 times per day  . hydroxychloroquine  400 mg Oral Daily  . labetalol  100 mg Per Tube 3 times per day  . multivitamin  1 tablet Oral QHS  . thiamine  100 mg Oral Daily    Assessment/Plan: Continued altered mental status.   Renal function worsening.  White blood cell count increasing as well.  MRI of the brain reviewed and shows no acute changes. Remote infarcts are noted. EEG is only significant for slowing.  Although ental status issues may be metabolic but will continue to rule out lupus cerebritis as well.  Recommendations: 1.  LP.  Patient on sq heparin and will need to wait at least 24 hours after last dose.    Case discussed with Dr. Elsworth Soho   LOS: 13 days   Alexis Goodell, MD [facility] [phone] 02/01/2015  10:25 AM

## 2015-02-01 NOTE — Progress Notes (Signed)
SLP Cancellation Note  Patient Details Name: KEYDI GIEL MRN: [id number] DOB: [birth year]   Cancelled treatment:       Reason Eval/Treat Not Completed: Patient not medically ready, sign off   Celedonio Sortino, Katherene Ponto 02/01/2015, 7:31 AM

## 2015-02-01 NOTE — Progress Notes (Addendum)
PULMONARY / CRITICAL CARE MEDICINE   Name: [name] [name] MRN: [id number] DOB: 06-25-1961    ADMISSION DATE:  01/18/2015 CONSULTATION DATE:  3/20  REFERRING MD :  Eliseo Squires   CHIEF COMPLAINT:  Tachycardia, resp failure   INITIAL PRESENTATION:  54 y/o female with hx lupus (on cellcept, plaquenil) and associated CKD, DM, HTN initially presented 3/17 with vague c/o SOB, diarrhea, malaise, weakness, cough.  She was admitted by [facility] and r/o for flu.  Was undergoing further w/u for diarrhea, mildly elevated troponin and anemia.  Overnight 3/19 developed intermittent SVT, then persistent tachycardia with HR 150's and worsening SOB and hypoxia requiring NRB.  PCCM consulted 3/20.  She required ETT/MV 3/20. CXR with B infiltrates. Initiated CVVHD on 3/21 pm.   STUDIES:  CXR 3/20>>> neg  2D echo 3/19>>> [id number]%, grade 1 diastolic dysfunction, mod AR, mod MR, mod TR. No pericardial effusion.  LE doppler US 3/20 >> negative   SIGNIFICANT EVENTS: 3/21  Adenosine challenge >> appears to be sinus rhythm with some non-conducted p-waves 3/23  Tolerated CVVHD, I/O even last 24 h, tachycardia improved, fent + propofol 3/24  High residuals overnight, on max dose fentanyl.  Rx'd with Reglan  3/26  Continues to have agitation with WUA / SBT, changed back to propofol/fentanyl  3/28 seroquel trance, neuro consult 3/29 more awake, follows commands 3/30 reintubated, bronchoscopy - for LLL atx  SUBJECTIVE: afebrile - tachy , BP soft now -no obvious pain Trance like   VITAL SIGNS: Temp:  [98 F (36.7 C)-99.2 F (37.3 C)] 98.3 F (36.8 C) (03/31 0700) Pulse Rate:  [78-154] 123 (03/31 0833) Resp:  [16-41] 16 (03/31 0833) BP: (73-202)/(55-89) 96/65 mmHg (03/31 0833) SpO2:  [86 %-100 %] 100 % (03/31 0833) FiO2 (%):  [60 %-100 %] 60 % (03/31 0833) Weight:  [51.2 kg (112 lb 14 oz)] 51.2 kg (112 lb 14 oz) (03/31 0200)   VENTILATOR SETTINGS: Vent Mode:  [-] PRVC FiO2 (%):  [60 %-100 %] 60 % Set Rate:  [16  bmp] 16 bmp Vt Set:  [480 mL] 480 mL PEEP:  [8 cmH20-10 cmH20] 10 cmH20 Plateau Pressure:  [27 cmH20-37 cmH20] 27 cmH20   INTAKE / OUTPUT:  Intake/Output Summary (Last 24 hours) at 02/01/15 0926 Last data filed at 02/01/15 0900  Gross per 24 hour  Intake 599.13 ml  Output    113 ml  Net 486.13 ml    PHYSICAL EXAMINATION: General:  Chronically ill appearing female Neuro:   follows commands with flicker movement in toes , trance like - tracks HEENT:  MM dry Cardiovascular:  s1s2 tachy  Lungs:  Clear bilaterally   Abdomen:  round, mildly distended, soft, BSx4 active   Musculoskeletal:  Warm and dry, no edema  LABS:  CBC  Recent Labs Lab 01/29/15 0506 01/31/15 0535 02/01/15 0445  WBC 9.6 14.7* 21.1*  HGB 10.1* 10.9* 9.5*  HCT 31.9* 35.9* 30.9*  PLT 365 458* 447*   Coag's  Recent Labs Lab 01/28/15 0345 01/29/15 0506 01/30/15 0551  APTT 144* >200* 136*    BMET  Recent Labs Lab 01/30/15 1600 01/31/15 0535 02/01/15 0445  NA 137 141 142  K 3.8 4.1 3.7  CL 103 105 105  CO2 28 25 23   BUN 22 29* 31*  CREATININE 2.12* 3.31* 4.23*  GLUCOSE 131* 135* 141*   Electrolytes  Recent Labs Lab 01/28/15 0345  01/29/15 0506  01/30/15 0551 01/30/15 1600 01/31/15 0535 02/01/15 0445  CALCIUM 9.6  < > 10.5  < >  10.4 10.3 10.9* 10.1  MG 2.3  --  2.6*  --  2.7*  --   --   --   PHOS 1.8*  < > 4.0  < > 3.3 3.8 5.4* 4.9*  < > = values in this interval not displayed. Sepsis Markers  Recent Labs Lab 01/26/15 0355 01/27/15 0445 01/28/15 0345  PROCALCITON 1.09 0.35 0.26     ABG  Recent Labs Lab 01/31/15 1429 01/31/15 1700 01/31/15 1725  PHART 7.383 7.303* 7.381  PCO2ART 34.3* 45.8* 36.4  PO2ART 52.0* 18.0* 208.0*   Liver Enzymes  Recent Labs Lab 01/29/15 1520  01/30/15 1600 01/31/15 0535 02/01/15 0445  AST 49*  --   --   --   --   ALT 18  --   --   --   --   ALKPHOS 108  --   --   --   --   BILITOT 0.8  --   --   --   --   ALBUMIN 2.6*  2.7*  <  > 2.7* 2.9* 2.4*  < > = values in this interval not displayed.   Cardiac Enzymes No results for input(s): TROPONINI, PROBNP in the last 168 hours. Glucose  Recent Labs Lab 01/31/15 1138 01/31/15 1602 01/31/15 2043 02/01/15 0023 02/01/15 0404 02/01/15 0739  GLUCAP 130* 113* 115* 137* 140* 143*    Imaging Dg Abd 1 View  01/31/2015   CLINICAL DATA:  Feeding tube placement.  EXAM: ABDOMEN - 1 VIEW  COMPARISON:  None.  FINDINGS: Feeding tube has been repositioned. Feeding tube tip is no longer in the esophagus. Feeding tube appears be in the stomach with tip projected over the upper fundus.  IMPRESSION: Feeding tube repositioning. Feeding tube tip is no longer in the esophagus. The distal tip appears to be now in the upper fundus. More distal placement may prove useful. These results will be called to the ordering clinician or representative by the Radiologist Assistant, and communication documented in the PACS or zVision Dashboard.   Electronically Signed   By: Marcello Moores  Register   On: 01/31/2015 13:54   Dg Chest Port 1 View  01/31/2015   CLINICAL DATA:  Respiratory difficulty, collagen vascular disease, chronic kidney disease  EXAM: PORTABLE CHEST - 1 VIEW  COMPARISON:  Portable chest x-ray of 01/31/2015 and 01/29/2015  FINDINGS: Since the chest x-ray of 01/29/2015 the patient has developed opacity within the left lung base suspicious for left lower lobe pneumonia and possibly atelectasis. There may be a small left pleural effusion present. The right lung is clear. Right central venous line tip is unchanged in position. The tip of the endotracheal tube appears to be approximately 6.4 cm above the carina. Left IJ central venous line tip overlies the mid upper SVC. Heart size is stable.  IMPRESSION: 1. Left basilar opacity consistent with atelectasis, pneumonia, and possibly a small left pleural effusion. Two views of the chest would be helpful if possible. 2. Tip of endotracheal tube approximately  6.4 cm above the carina.   Electronically Signed   By: Ivar Drape M.D.   On: 01/31/2015 16:50   Portable Chest Xray  01/31/2015   CLINICAL DATA:  Acute respiratory failure ; status post intubation of the trachea.  EXAM: PORTABLE CHEST - 1 VIEW  COMPARISON:  Portable chest x-ray of January 29, 2015  FINDINGS: The endotracheal tube tip lies approximately 6.4 cm above the carina. The Dobhoff type feeding tube tip enters the stomach and the Hg  fill bulb lies in the region of the gastric cardia. The right internal jugular Cordis sheath tip projects over the proximal portion of the SVC. The left internal jugular venous catheter tip projects over the proximal SVC as well. There is no postprocedure pneumothorax. There is dense consolidation of the left lower lobe with obscuration of the hemidiaphragm. The cardiac silhouette and pulmonary vascularity are normal. There is no right pleural effusion. There is no pneumothorax.  IMPRESSION: 1. New dense infiltrate and/or pleural effusion in the left lower hemithorax. 2. Appropriate positioning of the endotracheal tube and other support tubes. Distal migration of the Dobhoff tube is needed prior to feeding.   Electronically Signed   By: David  Martinique   On: 01/31/2015 13:49   Dg Abd Portable 1v  01/31/2015   CLINICAL DATA:  Feeding tube placement  EXAM: PORTABLE ABDOMEN - 1 VIEW  COMPARISON:  Plain film 01/31/2015  FINDINGS: Feeding tube with weighted tip in the gastric fundus. Interval removal of stylette. Left lower lobe atelectasis versus pneumonia.  IMPRESSION: Feeding tube with tip in the gastric fundus / proximal body.   Electronically Signed   By: Suzy Bouchard M.D.   On: 01/31/2015 20:58   Dg Abd Portable 1v  01/31/2015   CLINICAL DATA:  Assess feeding tube position following a attempts at repositioning  EXAM: PORTABLE ABDOMEN - 1 VIEW  COMPARISON:  Abdominal films of earlier today  FINDINGS: The feeding tube tip projects in the region of the proximal gastric  fundus somewhat lower than on the previous study. A moderate amount of tubing is coiled within the stomach. There is persistent left lower lobe atelectasis or pneumonia.  IMPRESSION: Slight interval improvement in the positioning of the feeding tube with the radiodense bulb lying in the region of the proximal gastric body. Advancement is needed prior to feeding tube use.   Electronically Signed   By: David  [place]   On: 01/31/2015 16:52   Dg Abd Portable 1v  01/31/2015   CLINICAL DATA:  Check feeding tube positioning.  EXAM: PORTABLE ABDOMEN - 1 VIEW  COMPARISON:  01/21/2015  FINDINGS: The feeding tube is coiled in the stomach, with tip in the distal esophagus.  Rectal tube present.  No bowel obstruction.  New opacification of the retrocardiac lung.  These results were called by telephone at the time of interpretation on 01/31/2015 at 12:35 pm to floor RN, who verbally acknowledged these results.  IMPRESSION: 1. The feeding tube coils through the stomach with tip re- entering the distal esophagus. The tube needs repositioning or exchange before use. 2. Retrocardiac opacification which is new 01/21/2015.   Electronically Signed   By: Monte Fantasia M.D.   On: 01/31/2015 12:36    ASSESSMENT / PLAN:  PULMONARY Acute hypoxic respiratory failure - unknown etiology but in setting B infiltrates.  -treated for HCAP, PE (d/c'd heparin 3/23). Consider acute pulmonary edema in setting HTN and renal failure-  infiltrates have completely cleared. Most likely cause is cardiogenic edema.  LLL atx s/p bronch 3/30 ETT 3/20 >> 3/29 reintubated 3/30 >>  P:   Daily WUA / SBT  Keep PEEP @ 10 x another day May need trach here   CARDIOVASCULAR SVT/sinus tachycardia - HR was 150 at time of her acute decompensation, Adenosine given, appeared to be sinus with some non-conducted p-waves. Presumed reactive to critical illness Hypotension Mildly elevated troponin peak 0.74 dCHF - new.  EF 40-45% with MOD aortic, tricuspid  and mitral regurg.  ??PE -  BLE venous dopplers prelim negative; doubt PE (heparin gtt stopped 3/23) P:  Dc Hydralazine PRN  Resume labetalol 100 mg tID (reduced home dose) -via NGT Hold home clonidine Use lopressor prn for HR 130 & above  RENAL Acute on chronic renal failure, presume lupus nephritis.  Hyponatremia  Metabolic acidosis P:   Appreciate renal service's assistance OK to transition to iHD - she is not expected to recover her renal fxn, will need permacath - can plan once WC decreases  GASTROINTESTINAL Diarrhea - initial resolved.  New with reglan 3/26, flexi seal placed.  Abd pain - neg abd xray  High Gastric Residuals Protein Calorie Malnutrition  P:   resume TF  dc'd reglan  Flexi-seal 3/26 Pepcid    HEMATOLOGIC Leukocytosis  Anemia - s/p 1 unit PRBC 3/22 P:  Cont iron    INFECTIOUS Fever - resolved  Immunocompromised Host Diarrhea - resolved Unclear etiology of infection, consider PNA in immunocompromised host but CXR has quickly cleared.  ??GI given diarrhea v viral v other source (ie lupus pneumonitis).  Immunocompromised on rx for lupus.  P:   BCx2 3/20 >>>neg UC 3/20 >>> negative BAL 3/30 >>  Vanc 3/20 (empiric) >>> 3/22, 3/30 >> Zosyn 3/20 (empiric) >>>3/28 Cefepime 3/30 >> (LLL atx)  Dc Empiric broad spectrum abx   resume cellcept & plaquenil   ENDOCRINE No active issue    P:   Monitor glucose on chem   NEUROLOGIC Acute encephalopathy -fluctuating Agitation / Delirium - Head Ct neg 1/77 EEG 9/39 metabolic enceph MRI 0/30 >> Remote RIGHT inferior brainstem hemorrhage, stable RIGHT basal ganglia micro hemorrhage, Remote small RIGHT inferior cerebellar infarct. P:   RASS goal: 0  Fent gtt Consider LP today    FAMILY  - Updates: Mother & sister updated daily  Summary - mental status main issue here -  Failed extubation, do we have to consider lupus cerebritis? Plan for trach  The patient is critically ill with multiple organ  systems failure and requires high complexity decision making for assessment and support, frequent evaluation and titration of therapies, application of advanced monitoring technologies and extensive interpretation of multiple databases. Critical Care Time devoted to patient care services described in this note independent of APP time is 35 minutes.    Kara Mead MD. [place] Flood. [facility] Pager [phone] If no response call [phone]    02/01/2015, 9:26 AM

## 2015-02-01 NOTE — Progress Notes (Signed)
[facility] ROUNDING NOTE   Subjective:   Events of last 24 hours noted. Re-intubated due to respiratory distress, O2 desaturation and respiratory distress Initially though to be flash pulmonary edema but CXR showed LLL atelectasis and bronch showed thick secretions suctioned out of left lung Neurologically unchanged - not following commands MRI some remote hemmorhages, nothing new   Objective:  Vital signs in last 24 hours:  Temp:  [98 F (36.7 C)-99.2 F (37.3 C)] 98.3 F (36.8 C) (03/31 0700) Pulse Rate:  [78-154] 123 (03/31 0833) Resp:  [16-41] 16 (03/31 0833) BP: (73-202)/(55-89) 96/65 mmHg (03/31 0833) SpO2:  [86 %-100 %] 100 % (03/31 0833) FiO2 (%):  [60 %-100 %] 60 % (03/31 0833) Weight:  [51.2 kg (112 lb 14 oz)] 51.2 kg (112 lb 14 oz) (03/31 0200)  Weight change: 0.7 kg (1 lb 8.7 oz) Filed Weights   01/30/15 0500 01/31/15 0528 02/01/15 0200  Weight: 52.4 kg (115 lb 8.3 oz) 50.5 kg (111 lb 5.3 oz) 51.2 kg (112 lb 14 oz)   Physical Examination BP 96/65 mmHg  Pulse 123  Temp(Src) 98.3 F (36.8 C) (Oral)  Resp 16  Wt 51.2 kg (112 lb 14 oz)  SpO2 100%  LMP 07/18/2012 Eyes open - not tracking or following commands  R IJ dialysis line dressing intact Lungs clear laterally and anteriorly S1S2 No S3 Tachy at 120 Abd soft, not tender SCD's in place and no edema LE's   Weight trending: (max weight this hospitalization around 60 kg) 3/28 52.6 3/29 52.4 3/30 50.5 (?) 3/31 51.2 kg   Recent Labs Lab 01/27/15 0400  01/27/15 1515 01/28/15 0345  01/29/15 0506 01/29/15 1520 01/30/15 0551 01/30/15 1600 01/31/15 0535 02/01/15 0445  NA  --   < > 136 137  < > 137 137 137 137 141 142  K  --   < > 3.6 3.7  < > 3.6 4.5 3.8 3.8 4.1 3.7  CL  --   < > 100 100  < > 96 100 99 103 105 105  CO2  --   < > 25 27  < > 26 24 25 28 25 23   GLUCOSE  --   < > 127* 110*  < > 128* 144* 152* 131* 135* 141*  BUN  --   < > 14 15  < > 12 14 14 22  29* 31*  CREATININE  --    < > 1.71* 1.61*  < > 1.61* 1.83* 1.61* 2.12* 3.31* 4.23*  CALCIUM  --   < > 9.1 9.6  < > 10.5 9.9 10.4 10.3 10.9* 10.1  MG 2.2  --  2.4 2.3  --  2.6*  --  2.7*  --   --   --   PHOS 2.2*  < > 2.4  2.4 1.8*  < > 4.0 4.7* 3.3 3.8 5.4* 4.9*  < > = values in this interval not displayed.   Recent Labs Lab 01/29/15 1520 01/30/15 0551 01/30/15 1600 01/31/15 0535 02/01/15 0445  AST 49*  --   --   --   --   ALT 18  --   --   --   --   ALKPHOS 108  --   --   --   --   BILITOT 0.8  --   --   --   --   PROT 7.0  --   --   --   --   ALBUMIN 2.6*  2.7* 2.7* 2.7*  2.9* 2.4*   No results for input(s): LIPASE, AMYLASE in the last 168 hours.  Recent Labs Lab 01/28/15 0950  AMMONIA 21    CBC:  Recent Labs Lab 01/27/15 0400 01/28/15 0345 01/29/15 0506 01/31/15 0535 02/01/15 0445  WBC 8.6 9.9 9.6 14.7* 21.1*  HGB 9.2* 9.6* 10.1* 10.9* 9.5*  HCT 29.0* 30.3* 31.9* 35.9* 30.9*  MCV 86.1 86.8 88.4 91.8 91.4  PLT 226 278 365 458* 447*    CBG:  Recent Labs Lab 01/31/15 1602 01/31/15 2043 02/01/15 0023 02/01/15 0404 02/01/15 0739  GLUCAP 113* 115* 137* 140* 143*    Imaging: Dg Abd 1 View  01/31/2015   CLINICAL DATA:  Feeding tube placement.  EXAM: ABDOMEN - 1 VIEW  COMPARISON:  None.  FINDINGS: Feeding tube has been repositioned. Feeding tube tip is no longer in the esophagus. Feeding tube appears be in the stomach with tip projected over the upper fundus.  IMPRESSION: Feeding tube repositioning. Feeding tube tip is no longer in the esophagus. The distal tip appears to be now in the upper fundus. More distal placement may prove useful. These results will be called to the ordering clinician or representative by the Radiologist Assistant, and communication documented in the PACS or zVision Dashboard.   Electronically Signed   By: Marcello Moores  Register   On: 01/31/2015 13:54   Mr Brain Wo Contrast  02/01/2015   CLINICAL DATA:  Altered mental status, not improving. Worsening renal  function, suspected metabolic encephalopathy. Assess for lupus cerebritis. History of stroke, lupus, diabetes, metabolic acidosis.  EXAM: MRI HEAD WITHOUT CONTRAST  TECHNIQUE: Multiplanar, multiecho pulse sequences of the brain and surrounding structures were obtained without intravenous contrast.  COMPARISON:  CT of the head January 28, 2015 and MRI of the brain October 11, 2014  FINDINGS: No reduced diffusion to suggest acute ischemia. Focal susceptibility in RIGHT inferior brainstem corresponding to known prior hemorrhage. Stable small focus of susceptibility artifact in RIGHT mesial caudate. No new areas of hemorrhage. No midline shift or mass effect.  Mild ventriculomegaly, likely on the basis of global parenchymal brain volume loss as there is overall commensurate enlargement of cerebral sulci and cerebellar folia, advanced for age. Tiny remote RIGHT cerebellar infarct. Mild white matter changes appear improved, including pontine T2 hyperintensities without midline shift, mass effect or mass lesions.  No abnormal extra-axial fluid collections. Normal major intracranial vascular flow voids seen at the skull base.  Bilateral mastoid effusions. Mild paranasal sinus mucosal thickening with layering secretions in the naso and oropharynx, nasogastric tube in place. Ocular globes and orbital contents are unremarkable though not tailored for evaluation. No abnormal sellar expansion. No cerebellar tonsillar ectopia. No suspicious calvarial bone marrow signal.  IMPRESSION: No acute intracranial process.  Remote RIGHT inferior brainstem hemorrhage, stable RIGHT basal ganglia micro hemorrhage, no new hemorrhage.  Mild parenchymal brain volume loss, advanced for age. Mild decreased white matter changes (which could be technical) and may reflect chronic small vessel ischemic disease, nonspecific, advanced for age . Remote small RIGHT inferior cerebellar infarct.  Mastoid effusions, mild paranasal sinusitis with life-support  lines in place.   Electronically Signed   By: Elon Alas   On: 02/01/2015 00:32   Portable Chest Xray  02/01/2015   CLINICAL DATA:  Respiratory failure.  EXAM: PORTABLE CHEST - 1 VIEW  COMPARISON:  01/31/2015 .  FINDINGS: Endotracheal tube, bilateral IJ lines, feeding tube in stable position. Heart size pulmonary vascularity stable. Persistent left lower lobe atelectasis and/or infiltrate. Small left  pleural effusion cannot be excluded. No pneumothorax. No acute osseous abnormality.  IMPRESSION: 1. Lines and tubes in stable position. 2. Persistent left lower lobe atelectasis and/or infiltrate. Small left pleural effusion cannot be excluded.   Electronically Signed   By: Marcello Moores  Register   On: 02/01/2015 07:30   Dg Chest Port 1 View  01/31/2015   CLINICAL DATA:  Respiratory difficulty, collagen vascular disease, chronic kidney disease  EXAM: PORTABLE CHEST - 1 VIEW  COMPARISON:  Portable chest x-ray of 01/31/2015 and 01/29/2015  FINDINGS: Since the chest x-ray of 01/29/2015 the patient has developed opacity within the left lung base suspicious for left lower lobe pneumonia and possibly atelectasis. There may be a small left pleural effusion present. The right lung is clear. Right central venous line tip is unchanged in position. The tip of the endotracheal tube appears to be approximately 6.4 cm above the carina. Left IJ central venous line tip overlies the mid upper SVC. Heart size is stable.  IMPRESSION: 1. Left basilar opacity consistent with atelectasis, pneumonia, and possibly a small left pleural effusion. Two views of the chest would be helpful if possible. 2. Tip of endotracheal tube approximately 6.4 cm above the carina.   Electronically Signed   By: Ivar Drape M.D.   On: 01/31/2015 16:50   Portable Chest Xray  01/31/2015   CLINICAL DATA:  Acute respiratory failure ; status post intubation of the trachea.  EXAM: PORTABLE CHEST - 1 VIEW  COMPARISON:  Portable chest x-ray of January 29, 2015   FINDINGS: The endotracheal tube tip lies approximately 6.4 cm above the carina. The Dobhoff type feeding tube tip enters the stomach and the Hg fill bulb lies in the region of the gastric cardia. The right internal jugular Cordis sheath tip projects over the proximal portion of the SVC. The left internal jugular venous catheter tip projects over the proximal SVC as well. There is no postprocedure pneumothorax. There is dense consolidation of the left lower lobe with obscuration of the hemidiaphragm. The cardiac silhouette and pulmonary vascularity are normal. There is no right pleural effusion. There is no pneumothorax.  IMPRESSION: 1. New dense infiltrate and/or pleural effusion in the left lower hemithorax. 2. Appropriate positioning of the endotracheal tube and other support tubes. Distal migration of the Dobhoff tube is needed prior to feeding.   Electronically Signed   By: David  Martinique   On: 01/31/2015 13:49   Dg Abd Portable 1v  01/31/2015   CLINICAL DATA:  Feeding tube placement  EXAM: PORTABLE ABDOMEN - 1 VIEW  COMPARISON:  Plain film 01/31/2015  FINDINGS: Feeding tube with weighted tip in the gastric fundus. Interval removal of stylette. Left lower lobe atelectasis versus pneumonia.  IMPRESSION: Feeding tube with tip in the gastric fundus / proximal body.   Electronically Signed   By: Suzy Bouchard M.D.   On: 01/31/2015 20:58   Dg Abd Portable 1v  01/31/2015   CLINICAL DATA:  Assess feeding tube position following a attempts at repositioning  EXAM: PORTABLE ABDOMEN - 1 VIEW  COMPARISON:  Abdominal films of earlier today  FINDINGS: The feeding tube tip projects in the region of the proximal gastric fundus somewhat lower than on the previous study. A moderate amount of tubing is coiled within the stomach. There is persistent left lower lobe atelectasis or pneumonia.  IMPRESSION: Slight interval improvement in the positioning of the feeding tube with the radiodense bulb lying in the region of the  proximal gastric body. Advancement is  needed prior to feeding tube use.   Electronically Signed   By: David  [place]   On: 01/31/2015 16:52   Dg Abd Portable 1v  01/31/2015   CLINICAL DATA:  Check feeding tube positioning.  EXAM: PORTABLE ABDOMEN - 1 VIEW  COMPARISON:  01/21/2015  FINDINGS: The feeding tube is coiled in the stomach, with tip in the distal esophagus.  Rectal tube present.  No bowel obstruction.  New opacification of the retrocardiac lung.  These results were called by telephone at the time of interpretation on 01/31/2015 at 12:35 pm to floor RN, who verbally acknowledged these results.  IMPRESSION: 1. The feeding tube coils through the stomach with tip re- entering the distal esophagus. The tube needs repositioning or exchange before use. 2. Retrocardiac opacification which is new 01/21/2015.   Electronically Signed   By: Monte Fantasia M.D.   On: 01/31/2015 12:36     Medications:   . dextrose 5 % and 0.9% NaCl 10 mL/hr at 01/31/15 2000  . fentaNYL infusion INTRAVENOUS 50 mcg/hr (02/01/15 0831)  . niCARDipine     . antiseptic oral rinse  7 mL Mouth Rinse QID  . ceFEPime (MAXIPIME) IV  2 g Intravenous Once  . chlorhexidine  15 mL Mouth Rinse BID  . [START ON 02/02/2015] darbepoetin (ARANESP) injection - NON-DIALYSIS  40 mcg Subcutaneous Q Fri-1800  . famotidine (PEPCID) IV  20 mg Intravenous Q24H  . folic acid  1 mg Oral Daily  . heparin subcutaneous  5,000 Units Subcutaneous 3 times per day  . hydroxychloroquine  400 mg Oral Daily  . labetalol  200 mg Per Tube 3 times per day  . multivitamin  1 tablet Oral QHS  . thiamine  100 mg Oral Daily   acetaminophen, fentaNYL, hydrocortisone-pramoxine, labetalol, metoprolol, ondansetron **OR** ondansetron (ZOFRAN) IV  Assessment/ Plan:  1. AKI on CKD5/new ESRD - CRRT 3/22-3/29  (temp cath placed 3/21) Did not tolerate hemo yesterday but our goal was volume removal for which was though to be pulm edema and I think volume was not the  issue which was why poorly tolerated.  Has no emergent need for dialysis today (volume/K and acid base all look fine to me at this time, CXR does not look like edema. Will reassess in the AM.  Will need TDC placed for HD when felt by all to be stable enough, and a permanent access before she leaves the hospital. 2. Recurrent resp failure - Reintubated 3/30, ? LLL PNA. Back on ATB's currently vanco and cefipime 3. Sustained tachycardia - on scheduled labetolol plus prn metoprolol 4. HTN - See #3 5. Diarrhea - rectal tube in place  6. SLE - plaquenil ordered but has not had in a couple days 7. AMS -  Not following commands. MRI nothing acute (old hemorrhages) and EEG just diffuse slowing. Await further recs from neuro.  8. Anemia - s/p transfusion 01/23/15. Fe studies normal on 01/05/15. On Darbe 150 QFriday and Hb was rising so we had empirically reduced dose to 40 but since down today will up back to 60. . 9. Secondary hyperpara - last PTH increased to > 1000 (1171) on 01/23/15 Phos rising some off CRRT. When has tube back in or able to swallow will need non-calcium based binder and sensipar.  10. Malnutrition - now has FT - hope feeds to start today   Jamal Maes, MD [place] Pager 02/01/2015, 9:27 AM

## 2015-02-01 NOTE — Progress Notes (Signed)
OT Cancellation Note  Patient Details Name: [name] [name] MRN: [id number] DOB: 1960-11-05   Cancelled Treatment:    Reason Eval/Treat Not Completed: Patient not medically ready (Signing off. Please reorder as appropriate.)  [name] 02/01/2015, 8:21 AM

## 2015-02-02 ENCOUNTER — Inpatient Hospital Stay (HOSPITAL_COMMUNITY): Payer: Managed Care, Other (non HMO)

## 2015-02-02 DIAGNOSIS — N184 Chronic kidney disease, stage 4 (severe): Secondary | ICD-10-CM

## 2015-02-02 LAB — RENAL FUNCTION PANEL
Albumin: 2.2 g/dL — ABNORMAL LOW (ref 3.5–5.2)
Anion gap: 14 (ref 5–15)
BUN: 50 mg/dL — AB (ref 6–23)
CO2: 22 mmol/L (ref 19–32)
Calcium: 9.8 mg/dL (ref 8.4–10.5)
Chloride: 107 mmol/L (ref 96–112)
Creatinine, Ser: 6.01 mg/dL — ABNORMAL HIGH (ref 0.50–1.10)
GFR calc Af Amer: 8 mL/min — ABNORMAL LOW (ref >90)
GFR calc non Af Amer: 7 mL/min — ABNORMAL LOW (ref >90)
GLUCOSE: 118 mg/dL — AB (ref 70–99)
POTASSIUM: 3.7 mmol/L (ref 3.5–5.1)
Phosphorus: 4.8 mg/dL — ABNORMAL HIGH (ref 2.3–4.6)
Sodium: 143 mmol/L (ref 135–145)

## 2015-02-02 LAB — CSF CELL COUNT WITH DIFFERENTIAL
RBC COUNT CSF: 1050 /mm3 — AB
RBC COUNT CSF: 9 /mm3 — AB
TUBE #: 1
Tube #: 4
WBC CSF: 0 /mm3 (ref 0–5)
WBC, CSF: 0 /mm3 (ref 0–5)

## 2015-02-02 LAB — HEPATITIS B CORE ANTIBODY, TOTAL: HEP B C TOTAL AB: NEGATIVE

## 2015-02-02 LAB — CBC
HCT: 27.9 % — ABNORMAL LOW (ref 36.0–46.0)
Hemoglobin: 8.5 g/dL — ABNORMAL LOW (ref 12.0–15.0)
MCH: 27.7 pg (ref 26.0–34.0)
MCHC: 30.5 g/dL (ref 30.0–36.0)
MCV: 90.9 fL (ref 78.0–100.0)
Platelets: 389 10*3/uL (ref 150–400)
RBC: 3.07 MIL/uL — AB (ref 3.87–5.11)
RDW: 18.1 % — ABNORMAL HIGH (ref 11.5–15.5)
WBC: 14.2 10*3/uL — ABNORMAL HIGH (ref 4.0–10.5)

## 2015-02-02 LAB — GLUCOSE, CAPILLARY
GLUCOSE-CAPILLARY: 122 mg/dL — AB (ref 70–99)
GLUCOSE-CAPILLARY: 106 mg/dL — AB (ref 70–99)
GLUCOSE-CAPILLARY: 104 mg/dL — AB (ref 70–99)
GLUCOSE-CAPILLARY: 117 mg/dL — AB (ref 70–99)
Glucose-Capillary: 111 mg/dL — ABNORMAL HIGH (ref 70–99)
Glucose-Capillary: 129 mg/dL — ABNORMAL HIGH (ref 70–99)

## 2015-02-02 LAB — APTT: aPTT: 46 seconds — ABNORMAL HIGH (ref 24–37)

## 2015-02-02 LAB — MAGNESIUM: Magnesium: 2.5 mg/dL (ref 1.5–2.5)

## 2015-02-02 LAB — HEPATITIS B SURFACE ANTIBODY,QUALITATIVE: HEP B S AB: REACTIVE

## 2015-02-02 LAB — HERPES SIMPLEX VIRUS(HSV) DNA BY PCR
HSV 1 DNA: NEGATIVE
HSV 2 DNA: NEGATIVE

## 2015-02-02 LAB — PROTIME-INR
INR: 1.29 (ref 0.00–1.49)
Prothrombin Time: 16.2 seconds — ABNORMAL HIGH (ref 11.6–15.2)

## 2015-02-02 LAB — PHOSPHORUS: Phosphorus: 4.7 mg/dL — ABNORMAL HIGH (ref 2.3–4.6)

## 2015-02-02 MED ORDER — LABETALOL HCL 200 MG PO TABS
200.0000 mg | ORAL_TABLET | Freq: Three times a day (TID) | ORAL | Status: DC
  Administered 2015-02-02 – 2015-02-08 (×18): 200 mg
  Filled 2015-02-02 (×21): qty 1

## 2015-02-02 MED ORDER — DEXTROSE 5 % IV SOLN
2.0000 g | Freq: Once | INTRAVENOUS | Status: AC
  Administered 2015-02-03: 2 g via INTRAVENOUS
  Filled 2015-02-02: qty 2

## 2015-02-02 MED ORDER — DEXTROSE 5 % IV SOLN
1000.0000 mg | Freq: Two times a day (BID) | INTRAVENOUS | Status: DC
  Administered 2015-02-02 – 2015-02-06 (×9): 1000 mg via INTRAVENOUS
  Filled 2015-02-02 (×18): qty 30

## 2015-02-02 MED ORDER — VITAL AF 1.2 CAL PO LIQD
1000.0000 mL | ORAL | Status: DC
  Administered 2015-02-02 – 2015-02-04 (×3): 1000 mL
  Filled 2015-02-02 (×6): qty 1000

## 2015-02-02 MED ORDER — VITAL HIGH PROTEIN PO LIQD
1000.0000 mL | ORAL | Status: DC
  Administered 2015-02-02: 1000 mL
  Filled 2015-02-02 (×2): qty 1000

## 2015-02-02 MED ORDER — MYCOPHENOLATE 200 MG/ML ORAL SUSPENSION
1000.0000 mg | Freq: Two times a day (BID) | ORAL | Status: DC
  Filled 2015-02-02: qty 20

## 2015-02-02 MED ORDER — PRO-STAT SUGAR FREE PO LIQD
30.0000 mL | Freq: Two times a day (BID) | ORAL | Status: AC
  Administered 2015-02-02 (×2): 30 mL
  Filled 2015-02-02 (×2): qty 30

## 2015-02-02 NOTE — Progress Notes (Signed)
Subjective: Patient remains intubated.    Objective: Current vital signs: BP 109/63 mmHg  Pulse 124  Temp(Src) 99.2 F (37.3 C) (Oral)  Resp 16  Wt 51.9 kg (114 lb 6.7 oz)  SpO2 100%  LMP 07/18/2012 Vital signs in last 24 hours: Temp:  [98.4 F (36.9 C)-100 F (37.8 C)] 99.2 F (37.3 C) (04/01 0907) Pulse Rate:  [117-137] 124 (04/01 0904) Resp:  [13-23] 16 (04/01 0904) BP: (85-146)/(56-102) 109/63 mmHg (04/01 0904) SpO2:  [100 %] 100 % (04/01 0904) FiO2 (%):  [40 %] 40 % (04/01 0904) Weight:  [51.9 kg (114 lb 6.7 oz)] 51.9 kg (114 lb 6.7 oz) (04/01 0500)  Intake/Output from previous day: 03/31 0701 - 04/01 0700 In: 800.3 [I.V.:445.3; NG/GT:140; IV Piggyback:100] Out: -  Intake/Output this shift: Total I/O In: 110 [I.V.:75; Other:15; NG/GT:20] Out: 150 [Stool:150] Nutritional status: Diet NPO time specified  Neurologic Exam: Mental Status: Intubated. Eyes looking forward. Does not track.  Cranial Nerves: II: Discs flat bilaterally; Visual fields grossly normal, pupils equal, round, reactive to light and accommodation III,IV, VI: ptosis not present, oculocephalic maneuver intact  V,VII: Corneals intact bilaterally VIII: hearing normal bilaterally IX,X: gag reflex decreased XI: shoulder shrug not tested XII: patient not following commands Motor: Moving upper extremities spontaneously and to command. Moves toes to command  Deep Tendon Reflexes: 2+ with absent AJ's bilaterally Plantars: Right: downgoingLeft: downgoing  Lab Results: Basic Metabolic Panel:  Recent Labs Lab 01/27/15 0400  01/27/15 1515 01/28/15 0345  01/29/15 0506  01/30/15 0551 01/30/15 1600 01/31/15 0535 02/01/15 0445 02/02/15 0245  NA  --   < > 136 137  < > 137  < > 137 137 141 142 143  K  --   < > 3.6 3.7  < > 3.6  < > 3.8 3.8 4.1 3.7 3.7  CL  --   < > 100 100  < > 96  < > 99 103 105 105 107  CO2  --   < > 25 27  < > 26  < > 25 28 25 23 22    GLUCOSE  --   < > 127* 110*  < > 128*  < > 152* 131* 135* 141* 118*  BUN  --   < > 14 15  < > 12  < > 14 22 29* 31* 50*  CREATININE  --   < > 1.71* 1.61*  < > 1.61*  < > 1.61* 2.12* 3.31* 4.23* 6.01*  CALCIUM  --   < > 9.1 9.6  < > 10.5  < > 10.4 10.3 10.9* 10.1 9.8  MG 2.2  --  2.4 2.3  --  2.6*  --  2.7*  --   --   --   --   PHOS 2.2*  < > 2.4  2.4 1.8*  < > 4.0  < > 3.3 3.8 5.4* 4.9* 4.8*  < > = values in this interval not displayed.  Liver Function Tests:  Recent Labs Lab 01/29/15 1520 01/30/15 0551 01/30/15 1600 01/31/15 0535 02/01/15 0445 02/02/15 0245  AST 49*  --   --   --   --   --   ALT 18  --   --   --   --   --   ALKPHOS 108  --   --   --   --   --   BILITOT 0.8  --   --   --   --   --  PROT 7.0  --   --   --   --   --   ALBUMIN 2.6*  2.7* 2.7* 2.7* 2.9* 2.4* 2.2*   No results for input(s): LIPASE, AMYLASE in the last 168 hours.  Recent Labs Lab 01/28/15 0950  AMMONIA 21    CBC:  Recent Labs Lab 01/28/15 0345 01/29/15 0506 01/31/15 0535 02/01/15 0445 02/02/15 0245  WBC 9.9 9.6 14.7* 21.1* 14.2*  HGB 9.6* 10.1* 10.9* 9.5* 8.5*  HCT 30.3* 31.9* 35.9* 30.9* 27.9*  MCV 86.8 88.4 91.8 91.4 90.9  PLT 278 365 458* 447* 389    Cardiac Enzymes: No results for input(s): CKTOTAL, CKMB, CKMBINDEX, TROPONINI in the last 168 hours.  Lipid Panel:  Recent Labs Lab 01/27/15 1541  TRIG 234*    CBG:  Recent Labs Lab 02/01/15 1623 02/01/15 2022 02/01/15 2356 02/02/15 0514 02/02/15 0837  GLUCAP 110* 132* 111* 122* 106*    Microbiology: Results for orders placed or performed during the hospital encounter of 01/18/15  MRSA PCR Screening     Status: None   Collection Time: 01/21/15 10:04 AM  Result Value Ref Range Status   MRSA by PCR NEGATIVE NEGATIVE Final    Comment:        The GeneXpert MRSA Assay (FDA approved for NASAL specimens only), is one component of a comprehensive MRSA colonization surveillance program. It is not intended to  diagnose MRSA infection nor to guide or monitor treatment for MRSA infections.   Culture, blood (routine x 2)     Status: None   Collection Time: 01/21/15 11:30 AM  Result Value Ref Range Status   Specimen Description BLOOD LEFT ASSIST CONTROL  Final   Special Requests BOTTLES DRAWN AEROBIC ONLY 8CC  Final   Culture   Final    NO GROWTH 5 DAYS Performed at [facility]    Report Status 01/27/2015 FINAL  Final  Culture, Urine     Status: None   Collection Time: 01/21/15  4:24 PM  Result Value Ref Range Status   Specimen Description URINE, CATHETERIZED  Final   Special Requests Immunocompromised  Final   Colony Count NO GROWTH Performed at [facility]   Final   Culture NO GROWTH Performed at [facility]   Final   Report Status 01/22/2015 FINAL  Final  Clostridium Difficile by PCR     Status: None   Collection Time: 01/27/15  9:38 AM  Result Value Ref Range Status   C difficile by pcr NEGATIVE NEGATIVE Final  Culture, bal-quantitative     Status: None (Preliminary result)   Collection Time: 01/31/15  3:11 PM  Result Value Ref Range Status   Specimen Description BRONCHIAL ALVEOLAR LAVAGE  Final   Special Requests Immunocompromised  Final   Gram Stain   Final    FEW WBC PRESENT,BOTH PMN AND MONONUCLEAR RARE SQUAMOUS EPITHELIAL CELLS PRESENT NO ORGANISMS SEEN Performed at [facility]   Final    20,OOO COLONIES/ML Performed at [facility]    Culture   Final    CANDIDA ALBICANS Performed at [facility]    Report Status PENDING  Incomplete  CSF culture     Status: None (Preliminary result)   Collection Time: 02/01/15 12:54 PM  Result Value Ref Range Status   Specimen Description CSF  Final   Special Requests Immunocompromised  Final   Gram Stain   Final    CYTOSPIN SLIDE WBC PRESENT, PREDOMINANTLY  MONONUCLEAR NO ORGANISMS SEEN Performed at [facility] Performed at [facility]     Culture PENDING  Incomplete   Report Status PENDING  Incomplete  Gram stain - STAT with CSF culture     Status: None   Collection Time: 02/01/15 12:54 PM  Result Value Ref Range Status   Specimen Description CSF  Final   Special Requests Immunocompromised  Final   Gram Stain   Final    WBC PRESENT, PREDOMINANTLY MONONUCLEAR NO ORGANISMS SEEN CSF CYTOSPIN    Report Status 02/01/2015 FINAL  Final    Coagulation Studies:  Recent Labs  02/02/15 0245  LABPROT 16.2*  INR 1.29    Imaging: Dg Abd 1 View  01/31/2015   CLINICAL DATA:  Feeding tube placement.  EXAM: ABDOMEN - 1 VIEW  COMPARISON:  None.  FINDINGS: Feeding tube has been repositioned. Feeding tube tip is no longer in the esophagus. Feeding tube appears be in the stomach with tip projected over the upper fundus.  IMPRESSION: Feeding tube repositioning. Feeding tube tip is no longer in the esophagus. The distal tip appears to be now in the upper fundus. More distal placement may prove useful. These results will be called to the ordering clinician or representative by the Radiologist Assistant, and communication documented in the PACS or zVision Dashboard.   Electronically Signed   By: Marcello Moores  Register   On: 01/31/2015 13:54   Mr Brain Wo Contrast  02/01/2015   CLINICAL DATA:  Altered mental status, not improving. Worsening renal function, suspected metabolic encephalopathy. Assess for lupus cerebritis. History of stroke, lupus, diabetes, metabolic acidosis.  EXAM: MRI HEAD WITHOUT CONTRAST  TECHNIQUE: Multiplanar, multiecho pulse sequences of the brain and surrounding structures were obtained without intravenous contrast.  COMPARISON:  CT of the head January 28, 2015 and MRI of the brain October 11, 2014  FINDINGS: No reduced diffusion to suggest acute ischemia. Focal susceptibility in RIGHT inferior brainstem corresponding to known prior hemorrhage. Stable small focus of susceptibility artifact in RIGHT mesial caudate. No new areas of  hemorrhage. No midline shift or mass effect.  Mild ventriculomegaly, likely on the basis of global parenchymal brain volume loss as there is overall commensurate enlargement of cerebral sulci and cerebellar folia, advanced for age. Tiny remote RIGHT cerebellar infarct. Mild white matter changes appear improved, including pontine T2 hyperintensities without midline shift, mass effect or mass lesions.  No abnormal extra-axial fluid collections. Normal major intracranial vascular flow voids seen at the skull base.  Bilateral mastoid effusions. Mild paranasal sinus mucosal thickening with layering secretions in the naso and oropharynx, nasogastric tube in place. Ocular globes and orbital contents are unremarkable though not tailored for evaluation. No abnormal sellar expansion. No cerebellar tonsillar ectopia. No suspicious calvarial bone marrow signal.  IMPRESSION: No acute intracranial process.  Remote RIGHT inferior brainstem hemorrhage, stable RIGHT basal ganglia micro hemorrhage, no new hemorrhage.  Mild parenchymal brain volume loss, advanced for age. Mild decreased white matter changes (which could be technical) and may reflect chronic small vessel ischemic disease, nonspecific, advanced for age . Remote small RIGHT inferior cerebellar infarct.  Mastoid effusions, mild paranasal sinusitis with life-support lines in place.   Electronically Signed   By: Elon Alas   On: 02/01/2015 00:32   Dg Chest Port 1 View  02/02/2015   CLINICAL DATA:  Hypoxia/respiratory failure  EXAM: PORTABLE CHEST - 1 VIEW  COMPARISON:  February 01, 2015  FINDINGS: Endotracheal tube tip is 5.4 cm above the carina.  Feeding tube tip is below the diaphragm. Central catheter tips are in the superior vena cava. No pneumothorax. There is a small area of infiltrate in the left lower lobe, less than on 1 day prior. Lungs elsewhere clear. Heart size and pulmonary vascularity are normal. No adenopathy. No bone lesions.  IMPRESSION: Partial but  incomplete clearing of infiltrate from left lower lobe. No new opacity. No change in cardiac silhouette. Tube and catheter positions as described without pneumothorax.   Electronically Signed   By: Lowella Grip III M.D.   On: 02/02/2015 07:47   Portable Chest Xray  02/01/2015   CLINICAL DATA:  Respiratory failure.  EXAM: PORTABLE CHEST - 1 VIEW  COMPARISON:  01/31/2015 .  FINDINGS: Endotracheal tube, bilateral IJ lines, feeding tube in stable position. Heart size pulmonary vascularity stable. Persistent left lower lobe atelectasis and/or infiltrate. Small left pleural effusion cannot be excluded. No pneumothorax. No acute osseous abnormality.  IMPRESSION: 1. Lines and tubes in stable position. 2. Persistent left lower lobe atelectasis and/or infiltrate. Small left pleural effusion cannot be excluded.   Electronically Signed   By: Marcello Moores  Register   On: 02/01/2015 07:30   Dg Chest Port 1 View  01/31/2015   CLINICAL DATA:  Respiratory difficulty, collagen vascular disease, chronic kidney disease  EXAM: PORTABLE CHEST - 1 VIEW  COMPARISON:  Portable chest x-ray of 01/31/2015 and 01/29/2015  FINDINGS: Since the chest x-ray of 01/29/2015 the patient has developed opacity within the left lung base suspicious for left lower lobe pneumonia and possibly atelectasis. There may be a small left pleural effusion present. The right lung is clear. Right central venous line tip is unchanged in position. The tip of the endotracheal tube appears to be approximately 6.4 cm above the carina. Left IJ central venous line tip overlies the mid upper SVC. Heart size is stable.  IMPRESSION: 1. Left basilar opacity consistent with atelectasis, pneumonia, and possibly a small left pleural effusion. Two views of the chest would be helpful if possible. 2. Tip of endotracheal tube approximately 6.4 cm above the carina.   Electronically Signed   By: Ivar Drape M.D.   On: 01/31/2015 16:50   Portable Chest Xray  01/31/2015   CLINICAL  DATA:  Acute respiratory failure ; status post intubation of the trachea.  EXAM: PORTABLE CHEST - 1 VIEW  COMPARISON:  Portable chest x-ray of January 29, 2015  FINDINGS: The endotracheal tube tip lies approximately 6.4 cm above the carina. The Dobhoff type feeding tube tip enters the stomach and the Hg fill bulb lies in the region of the gastric cardia. The right internal jugular Cordis sheath tip projects over the proximal portion of the SVC. The left internal jugular venous catheter tip projects over the proximal SVC as well. There is no postprocedure pneumothorax. There is dense consolidation of the left lower lobe with obscuration of the hemidiaphragm. The cardiac silhouette and pulmonary vascularity are normal. There is no right pleural effusion. There is no pneumothorax.  IMPRESSION: 1. New dense infiltrate and/or pleural effusion in the left lower hemithorax. 2. Appropriate positioning of the endotracheal tube and other support tubes. Distal migration of the Dobhoff tube is needed prior to feeding.   Electronically Signed   By: David  Martinique   On: 01/31/2015 13:49   Dg Abd Portable 1v  02/01/2015   CLINICAL DATA:  Feeding tube placement.  EXAM: PORTABLE ABDOMEN - 1 VIEW  COMPARISON:  Earlier this day at 1029 hr  FINDINGS: The enteric  tube has been repositioned, the weighted tip now in the region of the body directed caudally. Unchanged bowel gas pattern with normal caliber air-filled transverse colon. No free intra-abdominal air is seen.  IMPRESSION: The enteric tube repositioned, now in the region of the gastric body, directed caudally.   Electronically Signed   By: Jeb Levering M.D.   On: 02/01/2015 17:35   Dg Abd Portable 1v  02/01/2015   CLINICAL DATA:  Feeding catheter placement  EXAM: PORTABLE ABDOMEN - 1 VIEW  COMPARISON:  01/31/2015  FINDINGS: Feeding catheter is again noted within the stomach and kinked upon itself within the gastric lumen. The tip is noted adjacent to the gastroesophageal  junction. The bowel gas pattern is stable. No free air is seen.  IMPRESSION: Feeding catheter as described.   Electronically Signed   By: Inez Catalina M.D.   On: 02/01/2015 10:48   Dg Abd Portable 1v  01/31/2015   CLINICAL DATA:  Feeding tube placement  EXAM: PORTABLE ABDOMEN - 1 VIEW  COMPARISON:  Plain film 01/31/2015  FINDINGS: Feeding tube with weighted tip in the gastric fundus. Interval removal of stylette. Left lower lobe atelectasis versus pneumonia.  IMPRESSION: Feeding tube with tip in the gastric fundus / proximal body.   Electronically Signed   By: Suzy Bouchard M.D.   On: 01/31/2015 20:58   Dg Abd Portable 1v  01/31/2015   CLINICAL DATA:  Assess feeding tube position following a attempts at repositioning  EXAM: PORTABLE ABDOMEN - 1 VIEW  COMPARISON:  Abdominal films of earlier today  FINDINGS: The feeding tube tip projects in the region of the proximal gastric fundus somewhat lower than on the previous study. A moderate amount of tubing is coiled within the stomach. There is persistent left lower lobe atelectasis or pneumonia.  IMPRESSION: Slight interval improvement in the positioning of the feeding tube with the radiodense bulb lying in the region of the proximal gastric body. Advancement is needed prior to feeding tube use.   Electronically Signed   By: David  [place]   On: 01/31/2015 16:52   Dg Abd Portable 1v  01/31/2015   CLINICAL DATA:  Check feeding tube positioning.  EXAM: PORTABLE ABDOMEN - 1 VIEW  COMPARISON:  01/21/2015  FINDINGS: The feeding tube is coiled in the stomach, with tip in the distal esophagus.  Rectal tube present.  No bowel obstruction.  New opacification of the retrocardiac lung.  These results were called by telephone at the time of interpretation on 01/31/2015 at 12:35 pm to floor RN, who verbally acknowledged these results.  IMPRESSION: 1. The feeding tube coils through the stomach with tip re- entering the distal esophagus. The tube needs repositioning or  exchange before use. 2. Retrocardiac opacification which is new 01/21/2015.   Electronically Signed   By: Monte Fantasia M.D.   On: 01/31/2015 12:36    Medications:  I have reviewed the patient's current medications. Scheduled: . antiseptic oral rinse  7 mL Mouth Rinse QID  . chlorhexidine  15 mL Mouth Rinse BID  . darbepoetin (ARANESP) injection - NON-DIALYSIS  60 mcg Subcutaneous Q Fri-1800  . etomidate  40 mg Intravenous Once  . famotidine (PEPCID) IV  20 mg Intravenous Q24H  . feeding supplement (VITAL HIGH PROTEIN)  1,000 mL Per Tube Q24H  . fentaNYL  200 mcg Intravenous Once  . folic acid  1 mg Oral Daily  . heparin subcutaneous  5,000 Units Subcutaneous 3 times per day  . hydroxychloroquine  400  mg Oral Daily  . labetalol  100 mg Per Tube 3 times per day  . midazolam  4 mg Intravenous Once  . multivitamin  1 tablet Oral QHS  . propofol  5-80 mcg/kg/min Intravenous Once  . thiamine  100 mg Oral Daily  . vecuronium  10 mg Intravenous Once    Assessment/Plan: Patient unchanged.  Initial LP studies unremarkable.  Will make sure tests sent for further investigation to rule out lupus cerebritis.   Recommendations: 1.  Will continue to follow with you   LOS: 14 days   Alexis Goodell, MD [facility] [phone] 02/02/2015  10:24 AM

## 2015-02-02 NOTE — Progress Notes (Signed)
PULMONARY / CRITICAL CARE MEDICINE   Name: [name] [name] MRN: [id number] DOB: 01/31/1961    ADMISSION DATE:  01/18/2015 CONSULTATION DATE:  3/20  REFERRING MD :  Eliseo Squires   CHIEF COMPLAINT:  Tachycardia, resp failure   INITIAL PRESENTATION:  54 y/o female with hx lupus (on cellcept, plaquenil) and associated CKD, DM, HTN initially presented 3/17 with vague c/o SOB, diarrhea, malaise, weakness, cough.  She was admitted by [facility] and r/o for flu.  Was undergoing further w/u for diarrhea, mildly elevated troponin and anemia.  Overnight 3/19 developed intermittent SVT, then persistent tachycardia with HR 150's and worsening SOB and hypoxia requiring NRB.  PCCM consulted 3/20.  She required ETT/MV 3/20. CXR with B infiltrates. Initiated CVVHD on 3/21 pm.   STUDIES:  CXR 3/20>>> neg  2D echo 3/19>>> XV40-08%, grade 1 diastolic dysfunction, mod AR, mod MR, mod TR. No pericardial effusion.  LE doppler US 3/20 >> negative   SIGNIFICANT EVENTS: 3/21  Adenosine challenge >> appears to be sinus rhythm with some non-conducted p-waves 3/23  Tolerated CVVHD, I/O even last 24 h, tachycardia improved, fent + propofol 3/24  High residuals overnight, on max dose fentanyl.  Rx'd with Reglan  3/26  Continues to have agitation with WUA / SBT, changed back to propofol/fentanyl  3/28 seroquel trance, neuro consult 3/29 more awake, follows commands 3/30 reintubated, bronchoscopy - for LLL atx  SUBJECTIVE: afebrile - tachy , BP improved -no obvious pain More alert, secretions +  VITAL SIGNS: Temp:  [98.1 F (36.7 C)-100 F (37.8 C)] 98.1 F (36.7 C) (04/01 1246) Pulse Rate:  [117-137] 120 (04/01 1414) Resp:  [14-23] 18 (04/01 1100) BP: (89-155)/(57-85) 155/74 mmHg (04/01 1414) SpO2:  [100 %] 100 % (04/01 1100) FiO2 (%):  [40 %] 40 % (04/01 0904) Weight:  [51.9 kg (114 lb 6.7 oz)] 51.9 kg (114 lb 6.7 oz) (04/01 0500)   VENTILATOR SETTINGS: Vent Mode:  [-] PRVC FiO2 (%):  [40 %] 40 % Set Rate:  [16  bmp] 16 bmp Vt Set:  [480 mL] 480 mL PEEP:  [8 cmH20] 8 cmH20 Plateau Pressure:  [18 cmH20-22 cmH20] 21 cmH20   INTAKE / OUTPUT:  Intake/Output Summary (Last 24 hours) at 02/02/15 1421 Last data filed at 02/02/15 1100  Gross per 24 hour  Intake 712.83 ml  Output    150 ml  Net 562.83 ml    PHYSICAL EXAMINATION: General:  Chronically ill appearing female Neuro:   follows commands , more alert HEENT:  MM dry Cardiovascular:  s1s2 tachy  Lungs:  Clear bilaterally   Abdomen:  round, mildly distended, soft, BSx4 active   Musculoskeletal:  Warm and dry, no edema  LABS:  CBC  Recent Labs Lab 01/31/15 0535 02/01/15 0445 02/02/15 0245  WBC 14.7* 21.1* 14.2*  HGB 10.9* 9.5* 8.5*  HCT 35.9* 30.9* 27.9*  PLT 458* 447* 389   Coag's  Recent Labs Lab 01/29/15 0506 01/30/15 0551 02/02/15 0245  APTT >200* 136* 46*  INR  --   --  1.29    BMET  Recent Labs Lab 01/31/15 0535 02/01/15 0445 02/02/15 0245  NA 141 142 143  K 4.1 3.7 3.7  CL 105 105 107  CO2 25 23 22   BUN 29* 31* 50*  CREATININE 3.31* 4.23* 6.01*  GLUCOSE 135* 141* 118*   Electrolytes  Recent Labs Lab 01/29/15 0506  01/30/15 0551  01/31/15 0535 02/01/15 0445 02/02/15 0245 02/02/15 1105  CALCIUM 10.5  < > 10.4  < >  10.9* 10.1 9.8  --   MG 2.6*  --  2.7*  --   --   --   --  2.5  PHOS 4.0  < > 3.3  < > 5.4* 4.9* 4.8* 4.7*  < > = values in this interval not displayed. Sepsis Markers  Recent Labs Lab 01/27/15 0445 01/28/15 0345  PROCALCITON 0.35 0.26     ABG  Recent Labs Lab 01/31/15 1429 01/31/15 1700 01/31/15 1725  PHART 7.383 7.303* 7.381  PCO2ART 34.3* 45.8* 36.4  PO2ART 52.0* 18.0* 208.0*   Liver Enzymes  Recent Labs Lab 01/29/15 1520  01/31/15 0535 02/01/15 0445 02/02/15 0245  AST 49*  --   --   --   --   ALT 18  --   --   --   --   ALKPHOS 108  --   --   --   --   BILITOT 0.8  --   --   --   --   ALBUMIN 2.6*  2.7*  < > 2.9* 2.4* 2.2*  < > = values in this  interval not displayed.   Cardiac Enzymes No results for input(s): TROPONINI, PROBNP in the last 168 hours. Glucose  Recent Labs Lab 02/01/15 1623 02/01/15 2022 02/01/15 2356 02/02/15 0514 02/02/15 0837 02/02/15 1244  GLUCAP 110* 132* 111* 122* 106* 104*    Imaging Mr Brain Wo Contrast  02/01/2015   CLINICAL DATA:  Altered mental status, not improving. Worsening renal function, suspected metabolic encephalopathy. Assess for lupus cerebritis. History of stroke, lupus, diabetes, metabolic acidosis.  EXAM: MRI HEAD WITHOUT CONTRAST  TECHNIQUE: Multiplanar, multiecho pulse sequences of the brain and surrounding structures were obtained without intravenous contrast.  COMPARISON:  CT of the head January 28, 2015 and MRI of the brain October 11, 2014  FINDINGS: No reduced diffusion to suggest acute ischemia. Focal susceptibility in RIGHT inferior brainstem corresponding to known prior hemorrhage. Stable small focus of susceptibility artifact in RIGHT mesial caudate. No new areas of hemorrhage. No midline shift or mass effect.  Mild ventriculomegaly, likely on the basis of global parenchymal brain volume loss as there is overall commensurate enlargement of cerebral sulci and cerebellar folia, advanced for age. Tiny remote RIGHT cerebellar infarct. Mild white matter changes appear improved, including pontine T2 hyperintensities without midline shift, mass effect or mass lesions.  No abnormal extra-axial fluid collections. Normal major intracranial vascular flow voids seen at the skull base.  Bilateral mastoid effusions. Mild paranasal sinus mucosal thickening with layering secretions in the naso and oropharynx, nasogastric tube in place. Ocular globes and orbital contents are unremarkable though not tailored for evaluation. No abnormal sellar expansion. No cerebellar tonsillar ectopia. No suspicious calvarial bone marrow signal.  IMPRESSION: No acute intracranial process.  Remote RIGHT inferior brainstem  hemorrhage, stable RIGHT basal ganglia micro hemorrhage, no new hemorrhage.  Mild parenchymal brain volume loss, advanced for age. Mild decreased white matter changes (which could be technical) and may reflect chronic small vessel ischemic disease, nonspecific, advanced for age . Remote small RIGHT inferior cerebellar infarct.  Mastoid effusions, mild paranasal sinusitis with life-support lines in place.   Electronically Signed   By: Elon Alas   On: 02/01/2015 00:32   Portable Chest Xray  02/01/2015   CLINICAL DATA:  Respiratory failure.  EXAM: PORTABLE CHEST - 1 VIEW  COMPARISON:  01/31/2015 .  FINDINGS: Endotracheal tube, bilateral IJ lines, feeding tube in stable position. Heart size pulmonary vascularity stable. Persistent left lower lobe atelectasis  and/or infiltrate. Small left pleural effusion cannot be excluded. No pneumothorax. No acute osseous abnormality.  IMPRESSION: 1. Lines and tubes in stable position. 2. Persistent left lower lobe atelectasis and/or infiltrate. Small left pleural effusion cannot be excluded.   Electronically Signed   By: Marcello Moores  Register   On: 02/01/2015 07:30   Dg Abd Portable 1v  02/01/2015   CLINICAL DATA:  Feeding tube placement.  EXAM: PORTABLE ABDOMEN - 1 VIEW  COMPARISON:  Earlier this day at 1029 hr  FINDINGS: The enteric tube has been repositioned, the weighted tip now in the region of the body directed caudally. Unchanged bowel gas pattern with normal caliber air-filled transverse colon. No free intra-abdominal air is seen.  IMPRESSION: The enteric tube repositioned, now in the region of the gastric body, directed caudally.   Electronically Signed   By: Jeb Levering M.D.   On: 02/01/2015 17:35   Dg Abd Portable 1v  02/01/2015   CLINICAL DATA:  Feeding catheter placement  EXAM: PORTABLE ABDOMEN - 1 VIEW  COMPARISON:  01/31/2015  FINDINGS: Feeding catheter is again noted within the stomach and kinked upon itself within the gastric lumen. The tip is noted  adjacent to the gastroesophageal junction. The bowel gas pattern is stable. No free air is seen.  IMPRESSION: Feeding catheter as described.   Electronically Signed   By: Inez Catalina M.D.   On: 02/01/2015 10:48    ASSESSMENT / PLAN:  PULMONARY Acute hypoxic respiratory failure - unknown etiology but in setting B infiltrates.  -treated for HCAP, PE (d/c'd heparin 3/23). Consider acute pulmonary edema in setting HTN and renal failure-  infiltrates have completely cleared. Most likely cause is cardiogenic edema.  LLL atx s/p bronch 3/30 ETT 3/20 >> 3/29 reintubated 3/30 >>  P:   Daily WUA / SBT  Drop PEEP to 5 Perc trach deferred to next week   CARDIOVASCULAR SVT/sinus tachycardia - HR was 150 at time of her acute decompensation, Adenosine given, appeared to be sinus with some non-conducted p-waves. Presumed reactive to critical illness Hypotension Mildly elevated troponin peak 0.74 dCHF - new.  EF 40-45% with MOD aortic, tricuspid and mitral regurg.  ??PE - BLE venous dopplers prelim negative; doubt PE (heparin gtt stopped 3/23) P:  Dc Hydralazine PRN  Increase labetalol 200 mg tID (reduced home dose) -via NGT Hold home clonidine Use lopressor prn for HR 130 & above  RENAL Acute on chronic renal failure, presume lupus nephritis.  Hyponatremia  Metabolic acidosis P:   Appreciate renal service's assistance OK to transition to iHD - she is not expected to recover her renal fxn, will need permacath - can plan once WC decreases  GASTROINTESTINAL Diarrhea - initial resolved.  New with reglan 3/26, flexi seal placed.  Abd pain - neg abd xray  High Gastric Residuals Protein Calorie Malnutrition  P:   resume TF  dc'd reglan  Flexi-seal 3/26 Pepcid    HEMATOLOGIC Leukocytosis  Anemia - s/p 1 unit PRBC 3/22 P:  Cont iron    INFECTIOUS Fever - resolved  Immunocompromised Host Diarrhea - resolved Unclear etiology of infection, consider PNA in immunocompromised host but  CXR has quickly cleared.  ??GI given diarrhea v viral v other source (ie lupus pneumonitis).  Immunocompromised on rx for lupus.  P:   BCx2 3/20 >>>neg UC 3/20 >>> negative BAL 3/30 >> candida  Vanc 3/20 (empiric) >>> 3/22, 3/30 >> Zosyn 3/20 (empiric) >>>3/28 Cefepime 3/30 >> (LLL atx)  OK to resume cellcept &  plaquenil   ENDOCRINE No active issue    P:   Monitor glucose   NEUROLOGIC Acute encephalopathy -fluctuating- Head Ct neg 0/37 EEG 0/96 metabolic enceph MRI 4/38 >> Remote RIGHT inferior brainstem hemorrhage, stable RIGHT basal ganglia micro hemorrhage, Remote small RIGHT inferior cerebellar infarct. CSF 3/31 neg P:   RASS goal: 0  Fent gtt    FAMILY  - Updates: Mother & sister updated daily  Summary -Fluctuating mental status main issue here - doubt lupus cerebritis given bland CSF, Plan for trach next week  The patient is critically ill with multiple organ systems failure and requires high complexity decision making for assessment and support, frequent evaluation and titration of therapies, application of advanced monitoring technologies and extensive interpretation of multiple databases. Critical Care Time devoted to patient care services described in this note independent of APP time is 35 minutes.    Kara Mead MD. [facility] Flood. [facility] Pager [phone] If no response call [phone]    02/02/2015, 2:21 PM

## 2015-02-02 NOTE — Progress Notes (Signed)
ANTIBIOTIC CONSULT NOTE - INITIAL  Pharmacy Consult for Cefepime Indication: rule out pneumonia  Allergies  Allergen Reactions  . Sulfa Antibiotics Itching    Patient Measurements: Weight: 114 lb 6.7 oz (51.9 kg) Vital Signs: Temp: 98.1 F (36.7 C) (04/01 1246) Temp Source: Oral (04/01 1246) BP: 137/71 mmHg (04/01 1100) Pulse Rate: 127 (04/01 1100) Intake/Output from previous day: 03/31 0701 - 04/01 0700 In: 800.3 [I.V.:445.3; NG/GT:140; IV Piggyback:100] Out: -  Intake/Output from this shift: Total I/O In: 132.5 [I.V.:92.5; Other:20; NG/GT:20] Out: 150 [Stool:150]  Labs:  Recent Labs  01/31/15 0535 02/01/15 0445 02/02/15 0245  WBC 14.7* 21.1* 14.2*  HGB 10.9* 9.5* 8.5*  PLT 458* 447* 389  CREATININE 3.31* 4.23* 6.01*   Estimated Creatinine Clearance: 9 mL/min (by C-G formula based on Cr of 6.01). No results for input(s): VANCOTROUGH, VANCOPEAK, VANCORANDOM, GENTTROUGH, GENTPEAK, GENTRANDOM, TOBRATROUGH, TOBRAPEAK, TOBRARND, AMIKACINPEAK, AMIKACINTROU, AMIKACIN in the last 72 hours.   Microbiology: Recent Results (from the past 720 hour(s))  MRSA PCR Screening     Status: None   Collection Time: 01/21/15 10:04 AM  Result Value Ref Range Status   MRSA by PCR NEGATIVE NEGATIVE Final    Comment:        The GeneXpert MRSA Assay (FDA approved for NASAL specimens only), is one component of a comprehensive MRSA colonization surveillance program. It is not intended to diagnose MRSA infection nor to guide or monitor treatment for MRSA infections.   Culture, blood (routine x 2)     Status: None   Collection Time: 01/21/15 11:30 AM  Result Value Ref Range Status   Specimen Description BLOOD LEFT ASSIST CONTROL  Final   Special Requests BOTTLES DRAWN AEROBIC ONLY 8CC  Final   Culture   Final    NO GROWTH 5 DAYS Performed at [facility]    Report Status 01/27/2015 FINAL  Final  Culture, Urine     Status: None   Collection Time: 01/21/15  4:24 PM   Result Value Ref Range Status   Specimen Description URINE, CATHETERIZED  Final   Special Requests Immunocompromised  Final   Colony Count NO GROWTH Performed at [facility]   Final   Culture NO GROWTH Performed at [facility]   Final   Report Status 01/22/2015 FINAL  Final  Clostridium Difficile by PCR     Status: None   Collection Time: 01/27/15  9:38 AM  Result Value Ref Range Status   C difficile by pcr NEGATIVE NEGATIVE Final  Culture, bal-quantitative     Status: None (Preliminary result)   Collection Time: 01/31/15  3:11 PM  Result Value Ref Range Status   Specimen Description BRONCHIAL ALVEOLAR LAVAGE  Final   Special Requests Immunocompromised  Final   Gram Stain   Final    FEW WBC PRESENT,BOTH PMN AND MONONUCLEAR RARE SQUAMOUS EPITHELIAL CELLS PRESENT NO ORGANISMS SEEN Performed at [facility] Count   Final    20,OOO COLONIES/ML Performed at [facility]    Culture   Final    CANDIDA ALBICANS Performed at [facility]    Report Status PENDING  Incomplete  CSF culture     Status: None (Preliminary result)   Collection Time: 02/01/15 12:54 PM  Result Value Ref Range Status   Specimen Description CSF  Final   Special Requests Immunocompromised  Final   Gram Stain   Final    CYTOSPIN SLIDE WBC PRESENT, PREDOMINANTLY MONONUCLEAR NO ORGANISMS  SEEN Performed at [facility] Performed at [facility]    Culture PENDING  Incomplete   Report Status PENDING  Incomplete  Gram stain - STAT with CSF culture     Status: None   Collection Time: 02/01/15 12:54 PM  Result Value Ref Range Status   Specimen Description CSF  Final   Special Requests Immunocompromised  Final   Gram Stain   Final    WBC PRESENT, PREDOMINANTLY MONONUCLEAR NO ORGANISMS SEEN CSF CYTOSPIN    Report Status 02/01/2015 FINAL  Final    Medical History: Past Medical History  Diagnosis Date  . Hypertension   . Lupus   .  CKD (chronic kidney disease)     due to lupus/Dr. [name]  . Diabetes mellitus   . Stenosis of cervical spine region     with HNP at C5/6, C6/7  . Metatarsal bone fracture right    4th  . 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 Arthritis     RA  . Anemia   . Stroke 11/2014    left sided weakness, dysphagia  . Pain in joints   . Paresthesias   . Hyperlipidemia   . Colitis     ? per hospital notes 11/2014  . Lower GI bleed   . Hyponatremia   . Metabolic acidosis   . Hyperplastic colon polyp   . Hemorrhoids   . 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  Vancomycin has been discontinued today.    WBC 21.1 > 14.2, afeb.  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  No emergent dialysis needed at this time, next HD planned on 4/2.    Vanc 3/20>>3/23; 3/30 >>4/1 Zosyn 3/20>>3/28; 3/30>>3/31 Cefepime 3/31 >>  BCx2 3/20>neg UC 3/20>Neg  CSF 3/31 >> few lymphs  Goal of Therapy:  Vancomycin trough level 15-20 mcg/ml  Plan:  - Continue Cefepime 2g IV post HD (4/2) - F/U HD schedule - Monitor for clinical efficacy  Hassie Bruce, Pharm. D. Clinical Pharmacy Resident Pager: [phone] Ph: [phone] 02/02/2015 1:52 PM

## 2015-02-02 NOTE — Progress Notes (Addendum)
[facility] ROUNDING NOTE   Subjective:   Remains on vent Not requiring pressors Persistently tachycardic Had LP yesterday Gram stain unremarkable   Objective:  Vital signs in last 24 hours:  Temp:  [98.4 F (36.9 C)-100 F (37.8 C)] 99.2 F (37.3 C) (04/01 0907) Pulse Rate:  [117-137] 124 (04/01 0904) Resp:  [13-23] 16 (04/01 0904) BP: (85-146)/(56-102) 109/63 mmHg (04/01 0904) SpO2:  [100 %] 100 % (04/01 0904) FiO2 (%):  [40 %] 40 % (04/01 0904) Weight:  [51.9 kg (114 lb 6.7 oz)] 51.9 kg (114 lb 6.7 oz) (04/01 0500)  Weight change: 0.7 kg (1 lb 8.7 oz) Filed Weights   01/31/15 0528 02/01/15 0200 02/02/15 0500  Weight: 50.5 kg (111 lb 5.3 oz) 51.2 kg (112 lb 14 oz) 51.9 kg (114 lb 6.7 oz)   Physical Examination BP 109/63 mmHg  Pulse 124  Temp(Src) 99.2 F (37.3 C) (Oral)  Resp 16  Wt 51.9 kg (114 lb 6.7 oz)  SpO2 100%  LMP 07/18/2012 Eyes are open and I got a brief nod but could not get her to follow any commands  R IJ dialysis line dressing intact Lungs clear laterally and anteriorly S1S2 No S3 Tachycardic Abd soft, not tender SCD's in place and no edema LE's   Weight trending: (max weight this hospitalization around 60 kg) 3/28 52.6 3/29 52.4 3/30 50.5 (?) 3/31 51.2 kg 4/01 51.9 kg   Recent Labs Lab 01/27/15 0400  01/27/15 1515 01/28/15 0345  01/29/15 0506  01/30/15 0551 01/30/15 1600 01/31/15 0535 02/01/15 0445 02/02/15 0245  NA  --   < > 136 137  < > 137  < > 137 137 141 142 143  K  --   < > 3.6 3.7  < > 3.6  < > 3.8 3.8 4.1 3.7 3.7  CL  --   < > 100 100  < > 96  < > 99 103 105 105 107  CO2  --   < > 25 27  < > 26  < > 25 28 25 23 22   GLUCOSE  --   < > 127* 110*  < > 128*  < > 152* 131* 135* 141* 118*  BUN  --   < > 14 15  < > 12  < > 14 22 29* 31* 50*  CREATININE  --   < > 1.71* 1.61*  < > 1.61*  < > 1.61* 2.12* 3.31* 4.23* 6.01*  CALCIUM  --   < > 9.1 9.6  < > 10.5  < > 10.4 10.3 10.9* 10.1 9.8  MG 2.2  --  2.4 2.3  --   2.6*  --  2.7*  --   --   --   --   PHOS 2.2*  < > 2.4  2.4 1.8*  < > 4.0  < > 3.3 3.8 5.4* 4.9* 4.8*  < > = values in this interval not displayed.   Recent Labs Lab 01/29/15 1520 01/30/15 0551 01/30/15 1600 01/31/15 0535 02/01/15 0445 02/02/15 0245  AST 49*  --   --   --   --   --   ALT 18  --   --   --   --   --   ALKPHOS 108  --   --   --   --   --   BILITOT 0.8  --   --   --   --   --   PROT 7.0  --   --   --   --   --  ALBUMIN 2.6*  2.7* 2.7* 2.7* 2.9* 2.4* 2.2*   No results for input(s): LIPASE, AMYLASE in the last 168 hours.  Recent Labs Lab 01/28/15 0950  AMMONIA 21    CBC:  Recent Labs Lab 01/28/15 0345 01/29/15 0506 01/31/15 0535 02/01/15 0445 02/02/15 0245  WBC 9.9 9.6 14.7* 21.1* 14.2*  HGB 9.6* 10.1* 10.9* 9.5* 8.5*  HCT 30.3* 31.9* 35.9* 30.9* 27.9*  MCV 86.8 88.4 91.8 91.4 90.9  PLT 278 365 458* 447* 389    CBG:  Recent Labs Lab 02/01/15 1623 02/01/15 2022 02/01/15 2356 02/02/15 0514 02/02/15 0837  GLUCAP 110* 132* 111* 122* 106*    Imaging: Dg Abd 1 View  01/31/2015   CLINICAL DATA:  Feeding tube placement.  EXAM: ABDOMEN - 1 VIEW  COMPARISON:  None.  FINDINGS: Feeding tube has been repositioned. Feeding tube tip is no longer in the esophagus. Feeding tube appears be in the stomach with tip projected over the upper fundus.  IMPRESSION: Feeding tube repositioning. Feeding tube tip is no longer in the esophagus. The distal tip appears to be now in the upper fundus. More distal placement may prove useful. These results will be called to the ordering clinician or representative by the Radiologist Assistant, and communication documented in the PACS or zVision Dashboard.   Electronically Signed   By: Marcello Moores  Register   On: 01/31/2015 13:54   Mr Brain Wo Contrast  02/01/2015   CLINICAL DATA:  Altered mental status, not improving. Worsening renal function, suspected metabolic encephalopathy. Assess for lupus cerebritis. History of stroke, lupus,  diabetes, metabolic acidosis.  EXAM: MRI HEAD WITHOUT CONTRAST  TECHNIQUE: Multiplanar, multiecho pulse sequences of the brain and surrounding structures were obtained without intravenous contrast.  COMPARISON:  CT of the head January 28, 2015 and MRI of the brain October 11, 2014  FINDINGS: No reduced diffusion to suggest acute ischemia. Focal susceptibility in RIGHT inferior brainstem corresponding to known prior hemorrhage. Stable small focus of susceptibility artifact in RIGHT mesial caudate. No new areas of hemorrhage. No midline shift or mass effect.  Mild ventriculomegaly, likely on the basis of global parenchymal brain volume loss as there is overall commensurate enlargement of cerebral sulci and cerebellar folia, advanced for age. Tiny remote RIGHT cerebellar infarct. Mild white matter changes appear improved, including pontine T2 hyperintensities without midline shift, mass effect or mass lesions.  No abnormal extra-axial fluid collections. Normal major intracranial vascular flow voids seen at the skull base.  Bilateral mastoid effusions. Mild paranasal sinus mucosal thickening with layering secretions in the naso and oropharynx, nasogastric tube in place. Ocular globes and orbital contents are unremarkable though not tailored for evaluation. No abnormal sellar expansion. No cerebellar tonsillar ectopia. No suspicious calvarial bone marrow signal.  IMPRESSION: No acute intracranial process.  Remote RIGHT inferior brainstem hemorrhage, stable RIGHT basal ganglia micro hemorrhage, no new hemorrhage.  Mild parenchymal brain volume loss, advanced for age. Mild decreased white matter changes (which could be technical) and may reflect chronic small vessel ischemic disease, nonspecific, advanced for age . Remote small RIGHT inferior cerebellar infarct.  Mastoid effusions, mild paranasal sinusitis with life-support lines in place.   Electronically Signed   By: Elon Alas   On: 02/01/2015 00:32   Dg Chest  Port 1 View  02/02/2015   CLINICAL DATA:  Hypoxia/respiratory failure  EXAM: PORTABLE CHEST - 1 VIEW  COMPARISON:  February 01, 2015  FINDINGS: Endotracheal tube tip is 5.4 cm above the carina. Feeding tube tip is below  the diaphragm. Central catheter tips are in the superior vena cava. No pneumothorax. There is a small area of infiltrate in the left lower lobe, less than on 1 day prior. Lungs elsewhere clear. Heart size and pulmonary vascularity are normal. No adenopathy. No bone lesions.  IMPRESSION: Partial but incomplete clearing of infiltrate from left lower lobe. No new opacity. No change in cardiac silhouette. Tube and catheter positions as described without pneumothorax.   Electronically Signed   By: Lowella Grip III M.D.   On: 02/02/2015 07:47   Portable Chest Xray  02/01/2015   CLINICAL DATA:  Respiratory failure.  EXAM: PORTABLE CHEST - 1 VIEW  COMPARISON:  01/31/2015 .  FINDINGS: Endotracheal tube, bilateral IJ lines, feeding tube in stable position. Heart size pulmonary vascularity stable. Persistent left lower lobe atelectasis and/or infiltrate. Small left pleural effusion cannot be excluded. No pneumothorax. No acute osseous abnormality.  IMPRESSION: 1. Lines and tubes in stable position. 2. Persistent left lower lobe atelectasis and/or infiltrate. Small left pleural effusion cannot be excluded.   Electronically Signed   By: Marcello Moores  Register   On: 02/01/2015 07:30   Dg Chest Port 1 View  01/31/2015   CLINICAL DATA:  Respiratory difficulty, collagen vascular disease, chronic kidney disease  EXAM: PORTABLE CHEST - 1 VIEW  COMPARISON:  Portable chest x-ray of 01/31/2015 and 01/29/2015  FINDINGS: Since the chest x-ray of 01/29/2015 the patient has developed opacity within the left lung base suspicious for left lower lobe pneumonia and possibly atelectasis. There may be a small left pleural effusion present. The right lung is clear. Right central venous line tip is unchanged in position. The tip of  the endotracheal tube appears to be approximately 6.4 cm above the carina. Left IJ central venous line tip overlies the mid upper SVC. Heart size is stable.  IMPRESSION: 1. Left basilar opacity consistent with atelectasis, pneumonia, and possibly a small left pleural effusion. Two views of the chest would be helpful if possible. 2. Tip of endotracheal tube approximately 6.4 cm above the carina.   Electronically Signed   By: Ivar Drape M.D.   On: 01/31/2015 16:50   Portable Chest Xray  01/31/2015   CLINICAL DATA:  Acute respiratory failure ; status post intubation of the trachea.  EXAM: PORTABLE CHEST - 1 VIEW  COMPARISON:  Portable chest x-ray of January 29, 2015  FINDINGS: The endotracheal tube tip lies approximately 6.4 cm above the carina. The Dobhoff type feeding tube tip enters the stomach and the Hg fill bulb lies in the region of the gastric cardia. The right internal jugular Cordis sheath tip projects over the proximal portion of the SVC. The left internal jugular venous catheter tip projects over the proximal SVC as well. There is no postprocedure pneumothorax. There is dense consolidation of the left lower lobe with obscuration of the hemidiaphragm. The cardiac silhouette and pulmonary vascularity are normal. There is no right pleural effusion. There is no pneumothorax.  IMPRESSION: 1. New dense infiltrate and/or pleural effusion in the left lower hemithorax. 2. Appropriate positioning of the endotracheal tube and other support tubes. Distal migration of the Dobhoff tube is needed prior to feeding.   Electronically Signed   By: David  Martinique   On: 01/31/2015 13:49   Dg Abd Portable 1v  02/01/2015   CLINICAL DATA:  Feeding tube placement.  EXAM: PORTABLE ABDOMEN - 1 VIEW  COMPARISON:  Earlier this day at 1029 hr  FINDINGS: The enteric tube has been repositioned, the weighted  tip now in the region of the body directed caudally. Unchanged bowel gas pattern with normal caliber air-filled transverse colon.  No free intra-abdominal air is seen.  IMPRESSION: The enteric tube repositioned, now in the region of the gastric body, directed caudally.   Electronically Signed   By: Jeb Levering M.D.   On: 02/01/2015 17:35   Dg Abd Portable 1v  02/01/2015   CLINICAL DATA:  Feeding catheter placement  EXAM: PORTABLE ABDOMEN - 1 VIEW  COMPARISON:  01/31/2015  FINDINGS: Feeding catheter is again noted within the stomach and kinked upon itself within the gastric lumen. The tip is noted adjacent to the gastroesophageal junction. The bowel gas pattern is stable. No free air is seen.  IMPRESSION: Feeding catheter as described.   Electronically Signed   By: Inez Catalina M.D.   On: 02/01/2015 10:48   Dg Abd Portable 1v  01/31/2015   CLINICAL DATA:  Feeding tube placement  EXAM: PORTABLE ABDOMEN - 1 VIEW  COMPARISON:  Plain film 01/31/2015  FINDINGS: Feeding tube with weighted tip in the gastric fundus. Interval removal of stylette. Left lower lobe atelectasis versus pneumonia.  IMPRESSION: Feeding tube with tip in the gastric fundus / proximal body.   Electronically Signed   By: Suzy Bouchard M.D.   On: 01/31/2015 20:58   Dg Abd Portable 1v  01/31/2015   CLINICAL DATA:  Assess feeding tube position following a attempts at repositioning  EXAM: PORTABLE ABDOMEN - 1 VIEW  COMPARISON:  Abdominal films of earlier today  FINDINGS: The feeding tube tip projects in the region of the proximal gastric fundus somewhat lower than on the previous study. A moderate amount of tubing is coiled within the stomach. There is persistent left lower lobe atelectasis or pneumonia.  IMPRESSION: Slight interval improvement in the positioning of the feeding tube with the radiodense bulb lying in the region of the proximal gastric body. Advancement is needed prior to feeding tube use.   Electronically Signed   By: David  [place]   On: 01/31/2015 16:52   Dg Abd Portable 1v  01/31/2015   CLINICAL DATA:  Check feeding tube positioning.  EXAM:  PORTABLE ABDOMEN - 1 VIEW  COMPARISON:  01/21/2015  FINDINGS: The feeding tube is coiled in the stomach, with tip in the distal esophagus.  Rectal tube present.  No bowel obstruction.  New opacification of the retrocardiac lung.  These results were called by telephone at the time of interpretation on 01/31/2015 at 12:35 pm to floor RN, who verbally acknowledged these results.  IMPRESSION: 1. The feeding tube coils through the stomach with tip re- entering the distal esophagus. The tube needs repositioning or exchange before use. 2. Retrocardiac opacification which is new 01/21/2015.   Electronically Signed   By: Monte Fantasia M.D.   On: 01/31/2015 12:36     Medications:   . dextrose 5 % and 0.9% NaCl 10 mL/hr at 01/31/15 2000  . fentaNYL infusion INTRAVENOUS 75 mcg/hr (02/02/15 0800)   . antiseptic oral rinse  7 mL Mouth Rinse QID  . chlorhexidine  15 mL Mouth Rinse BID  . darbepoetin (ARANESP) injection - NON-DIALYSIS  60 mcg Subcutaneous Q Fri-1800  . etomidate  40 mg Intravenous Once  . famotidine (PEPCID) IV  20 mg Intravenous Q24H  . feeding supplement (VITAL HIGH PROTEIN)  1,000 mL Per Tube Q24H  . fentaNYL  200 mcg Intravenous Once  . folic acid  1 mg Oral Daily  . heparin subcutaneous  5,000  Units Subcutaneous 3 times per day  . hydroxychloroquine  400 mg Oral Daily  . labetalol  100 mg Per Tube 3 times per day  . midazolam  4 mg Intravenous Once  . multivitamin  1 tablet Oral QHS  . propofol  5-80 mcg/kg/min Intravenous Once  . thiamine  100 mg Oral Daily  . vecuronium  10 mg Intravenous Once   acetaminophen, fentaNYL, hydrocortisone-pramoxine, labetalol, metoprolol, ondansetron **OR** ondansetron (ZOFRAN) IV  Assessment/ Plan:  1. 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 Will plan for HD tomorrow.   Will need TDC placed for HD when felt by all to be stable enough, and a permanent access  before she leaves the hospital. 2. Recurrent resp failure - Reintubated 3/30. Off ATB's now. Partial clearing of LLL process and otherwise clear 3. Sustained tachycardia - on scheduled labetolol plus prn metoprolol 4. HTN - See #3 5. Diarrhea - rectal tube in place  6. SLE - plaquenil ordered but has not had in a couple days 7. AMS -  MRI nothing acute (old hemorrhages) and EEG just diffuse slowing. LP looked unremarkable. Await further recs from neuro.  8. Anemia - s/p transfusion 01/23/15. Fe studies normal on 01/05/15. 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 When has tube back in or able to swallow will need non-calcium based binder and sensipar (d/t corrected hypercalcemia) 10. Malnutrition - feed   Jamal Maes, MD [facility] [phone] Pager 02/02/2015, 10:06 AM

## 2015-02-02 NOTE — Consult Note (Signed)
Hospital Consult    Reason for Consult:  In need of diatek and permanent HD access Referring Physician:  Lorrene Reid MRN #:  [id number]  Hx obtained from the chart and her sister as she is intubated  History of Present Illness: This is a 55 y.o. female who presented on 01/18/15 with shortness of breath for about a day.  She had cold symptoms of cough and some congestion and sore throat as well as diarrhea.  During her workup in the ER, she did have significant anemia & pt stated she had been taking iron.    She was admitted for blood transfusion.  On 01/20/15, she developed intermittent SVT with persistent tachycardia with HR 150's and worsening SOB and hypoxia requiring NRB.    She did get intubated.  She was also started on CVVHD.  She did get extubated and developed flash pulmonary edema and required re-intubation.   CXR revealed LLL atelectasis and bronchoscopy showed thick secretions suctioned out of the lung.   EEG revealed only significant for slowing.  She had a lumbar puncture yesterday.  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  She is on immunosuppressives.     Sister states the pt is right handed.  States she has not had any surgery in the past.  Current smoker up until admission.   Past Medical History  Diagnosis Date  . Hypertension   . Lupus   . CKD (chronic kidney disease)     due to lupus/Dr. [name]  . Diabetes mellitus   . Stenosis of cervical spine region     with HNP at C5/6, C6/7  . Metatarsal bone fracture right    4th  . 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 Arthritis     RA  . Anemia   . Stroke 11/2014    left sided weakness, dysphagia  . Pain in joints   . Paresthesias   . Hyperlipidemia   . Colitis     ? per hospital notes 11/2014  . Lower GI bleed   . Hyponatremia   . Metabolic acidosis   . Hyperplastic colon polyp   . Hemorrhoids   . Blood transfusion without reported diagnosis     Past  Surgical History  Procedure Laterality Date  . Breast biopsy    . Tubal ligation      Allergies  Allergen Reactions  . Sulfa Antibiotics Itching    Prior to Admission medications   Medication Sig Start Date End Date Taking? Authorizing Provider  acetaminophen (TYLENOL) 325 MG tablet Take 2 tablets (650 mg total) by mouth every 4 (four) hours as needed for mild pain. 10/25/14  Yes Ivan Anchors Love, PA-C  ALPRAZolam (XANAX) 0.5 MG tablet Take 1 tablet (0.5 mg total) by mouth 2 (two) times daily as needed for anxiety. 10/25/14  Yes Ivan Anchors Love, PA-C  gabapentin (NEURONTIN) 100 MG capsule Take 1 capsule (100 mg total) by mouth at bedtime. Patient taking differently: Take 100 mg by mouth 3 (three) times daily.  11/25/14  Yes Orson Eva, MD  labetalol (NORMODYNE) 300 MG tablet Take 2 tablets (600 mg total) by mouth 2 (two) times daily. 11/25/14  Yes Orson Eva, MD  mycophenolate (CELLCEPT) 250 MG capsule Take 4 capsules (1,000 mg total) by mouth 2 (two) times daily. 10/25/14  Yes Ivan Anchors Love, PA-C  senna-docusate (SENOKOT-S) 8.6-50 MG per tablet Take  1 tablet by mouth 2 (two) times daily. Patient taking differently: Take 1 tablet by mouth 2 (two) times daily as needed.  10/25/14  Yes Ivan Anchors Love, PA-C  sodium bicarbonate 650 MG tablet Take 2 tablets (1,300 mg total) by mouth 2 (two) times daily. 10/25/14  Yes Ivan Anchors Love, PA-C  Vitamin D, Ergocalciferol, (DRISDOL) 50000 UNITS CAPS capsule Take 1 capsule (50,000 Units total) by mouth every Wednesday. 10/25/14  Yes Ivan Anchors Love, PA-C  atorvastatin (LIPITOR) 20 MG tablet Take 1 tablet (20 mg total) by mouth daily at 6 PM. 10/25/14   Ivan Anchors Love, PA-C  cloNIDine (CATAPRES) 0.3 MG tablet Take 1 tablet (0.3 mg total) by mouth 3 (three) times daily. 10/25/14   Bary Leriche, PA-C  clotrimazole (MYCELEX) 10 MG troche Take 1 tablet (10 mg total) by mouth 5 (five) times daily. 11/25/14   Orson Eva, MD  cyclobenzaprine (FLEXERIL) 5 MG tablet Take 5 mg by  mouth 3 (three) times daily as needed for muscle spasms.  10/10/14   Historical Provider, MD  diclofenac sodium (VOLTAREN) 1 % GEL Apply 2 g topically 4 (four) times daily. To forefoot 10/25/14   Ivan Anchors Love, PA-C  ferrous sulfate 325 (65 FE) MG tablet Take 1 tablet (325 mg total) by mouth daily with breakfast. 10/25/14   Ivan Anchors Love, PA-C  furosemide (LASIX) 80 MG tablet Take 80 mg by mouth 2 (two) times daily. 10/25/14   Historical Provider, MD  hydrALAZINE (APRESOLINE) 25 MG tablet Take 25 mg by mouth every 8 (eight) hours. 10/25/14   Historical Provider, MD  hydroxychloroquine (PLAQUENIL) 200 MG tablet Take 2 tablets (400 mg total) by mouth daily. 10/25/14   Bary Leriche, PA-C  lidocaine (XYLOCAINE) 2 % solution  11/06/14   Historical Provider, MD  Menthol-Methyl Salicylate (MUSCLE RUB) 10-15 % CREA Apply 1 application topically 3 (three) times daily before meals. 10/25/14   Bary Leriche, PA-C  multivitamin (RENA-VIT) TABS tablet Take 1 tablet by mouth daily.    Historical Provider, MD  nystatin-triamcinolone (MYCOLOG II) cream Apply topically 2 (two) times daily. 11/06/14   Historical Provider, MD  nystatin-triamcinolone ointment (MYCOLOG) Apply 1 application topically 2 (two) times daily. 11/06/14   Mercedes Camprubi-Soms, PA-C  pantoprazole (PROTONIX) 40 MG tablet Take 1 tablet (40 mg total) by mouth daily. 12/07/14   Laban Emperor Zehr, PA-C  pramoxine-hydrocortisone (PROCTOCREAM-HC) 1-1 % rectal cream Place 1 application rectally 2 (two) times daily. Patient taking differently: Place 1 application rectally 2 (two) times daily as needed for hemorrhoids or itching.  12/07/14   Loralie Champagne, PA-C    History   Social History  . Marital Status: Single    Spouse Name: N/A  . Number of Children: N/A  . Years of Education: N/A   Occupational History  . Not on file.   Social History Main Topics  . Smoking status: Former Smoker -- 0.50 packs/day for 30 years    Types: Cigarettes  . Smokeless  tobacco: Never Used  . Alcohol Use: No  . Drug Use: No  . Sexual Activity: Not on file   Other Topics Concern  . Not on file   Social History Narrative     Family History  Problem Relation Age of Onset  . Hypertension    . Lupus    . Rheum arthritis    . Hypertension Mother   . Diabetes Mother   . Hypertension Sister   . Diabetes Father   .  Hypertension Maternal Grandmother     ROS: [x]  Positive   [ ]  Negative   [ ]  All sytems reviewed and are negative  Cardiovascular: []  chest pain/pressure []  palpitations []  SOB lying flat [x]  DOE []  pain in legs while walking []  pain in legs at rest []  pain in legs at night []  non-healing ulcers []  hx of DVT []  swelling in legs  Pulmonary: [x]  productive cough []  asthma/wheezing []  home O2  Neurologic: []  weakness in []  arms []  legs []  numbness in []  arms []  legs [x]  hx of CVA []  mini stroke [] difficulty speaking or slurred speech []  temporary loss of vision in one eye []  dizziness  Hematologic: []  hx of cancer []  bleeding problems []  problems with blood clotting easily [x]  anemia - takes iron (received 3 units PRBC's this admission)  Endocrine:   []  diabetes []  thyroid disease  GI []  vomiting blood []  blood in stool [x]  hx GIB [x]  diarrhea   GU: [x]  CKD/renal failure []  HD--[]  M/W/F or []  T/T/S []  burning with urination []  blood in urine  Psychiatric: []  anxiety []  depression  Musculoskeletal: []  arthritis [x]  joint pain  Integumentary: []  rashes []  ulcers  Constitutional: [x]  fever [x]  chills [x]  fatigue   Physical Examination  Filed Vitals:   02/02/15 1100  BP: 137/71  Pulse: 127  Temp:   Resp: 18   Body mass index is 19.04 kg/(m^2).  General:  WDWN in NAD Gait: Not observed HENT: WNL, normocephalic Pulmonary: intubated; coarse breath sounds throughout Cardiac: tachycardic/regular, without  Murmurs, rubs or gallops;  Abdomen: soft, NT/ND, no masses Skin: without rashes,  without ulcers  Vascular Exam/Pulses:  Right Left  Radial 2+ (normal) 2+ (normal)  Ulnar Unable to palpate  Unable to palpate   DP 2+ (normal) 2+ (normal)   Extremities: without ischemic changes, without Gangrene , without cellulitis; without open wounds;  Musculoskeletal: no muscle wasting or atrophy  Neurologic: sluggish; follows occasional commands  CBC    Component Value Date/Time   WBC 14.2* 02/02/2015 0245   WBC 5.5 03/01/2012 1128   RBC 3.07* 02/02/2015 0245   RBC 3.56* 03/01/2012 1128   HGB 8.5* 02/02/2015 0245   HGB 9.0* 03/01/2012 1128   HCT 27.9* 02/02/2015 0245   HCT 29.3* 03/01/2012 1128   PLT 389 02/02/2015 0245   MCV 90.9 02/02/2015 0245   MCV 82.4 03/01/2012 1128   MCH 27.7 02/02/2015 0245   MCH 25.3* 03/01/2012 1128   MCHC 30.5 02/02/2015 0245   MCHC 30.7* 03/01/2012 1128   RDW 18.1* 02/02/2015 0245   LYMPHSABS 0.6* 11/18/2014 2324   MONOABS 0.4 11/18/2014 2324   EOSABS 0.0 11/18/2014 2324   BASOSABS 0.0 11/18/2014 2324    BMET    Component Value Date/Time   NA 143 02/02/2015 0245   K 3.7 02/02/2015 0245   CL 107 02/02/2015 0245   CO2 22 02/02/2015 0245   GLUCOSE 118* 02/02/2015 0245   BUN 50* 02/02/2015 0245   CREATININE 6.01* 02/02/2015 0245   CREATININE 3.49* 11/10/2014 1822   CREATININE 0.35* 10/23/2008 2300   CALCIUM 9.8 02/02/2015 0245   CALCIUM 8.9 10/10/2014 1931   GFRNONAA 7* 02/02/2015 0245   GFRAA 8* 02/02/2015 0245    COAGS: Lab Results  Component Value Date   INR 1.29 02/02/2015   INR 1.18 11/19/2014   INR 1.06 09/28/2014     Non-Invasive Vascular Imaging:   Vein mapping pending  Statin:  Yes.   Beta Blocker:  Yes.  Aspirin:  No. ACEI:  No. ARB:  No. Other antiplatelets/anticoagulants:  No.    ASSESSMENT/PLAN: This is a 54 y.o. female with hx of lupus with CKD 4 that has progressed and pt is now in need of diatek catheter and permanent HD access.  The pt is right hand dominant according to her  sister.   -this pt is critically ill and will need to be more stable before going to the OR.  If improvement, may be able to place one day next week.  Will monitor her progress. -will await vein mapping to determine what access will be appropriate. -sister seems to think pt is going for a trach on Monday, however, I do not see this on the OR schedule.   Leontine Locket, PA-C [facility] [phone]  I have examined the patient, reviewed and agree with above. Will follow with you. Will need tunnel catheter once her white count is trending down. Vein mapping for permanent access  EARLY, TODD, MD 02/02/2015 9:53 PM

## 2015-02-02 NOTE — Progress Notes (Addendum)
NUTRITION FOLLOW UP  Pt meets criteria for SEVERE MALNUTRITION in the context of ACUTE ILLNESS/INJURT as evidenced by 11% weight loss in 2 weeks and severe muscle wasting.  Intervention:   Initiate TF via NGT with Vital  AF 1.2 at 25 ml/h and Prostat 30 ml BID on day 1; on day 2, increase to goal rate of 50 ml/h (1200 ml per day) and discontinue Pro-stat to provide 1440 kcals, 90 gm protein, 972 ml free water daily.  Nutrition Dx:   Inadequate oral intake related to inability to eat as evidenced by NPO status; ongoing  Goal:   Intake to meet >90% of estimated nutrition needs; currently unmet  Monitor:   TF initiation/tolerance/adequacy, weight trend, labs, vent status.  Assessment:   Patient admitted with weakness and recent weight loss. History of lupus, CKD, DM. Patient with frequent diarrhea within 45 minutes after eating since starting Cellcept in November. She has lost 20 lbs in the past 3-4 months.  RD re-consulted for TF management. Pt remains on vent with NGT in place. No TF running at time of visit. Per MD note no dialysis today; will plan for HD tomorrow. Ongoing diarrhea, rectal tube in place.   MV: 8.2 L/min Temp (24hrs), Avg:99.2 F (37.3 C), Min:98.4 F (36.9 C), Max:100 F (37.8 C)   Labs: elevated creatinine, low GFR, low hemoglobin  Nutrition Focused Physical Exam:  Subcutaneous Fat:  Orbital Region: mild wasting Upper Arm Region: mild wasting Thoracic and Lumbar Region: NA  Muscle:  Temple Region: moderate wasting Clavicle Bone Region: moderate wasting Clavicle and Acromion Bone Region: mild wasting Scapular Bone Region: NA Dorsal Hand: NA Patellar Region: severe wasting Anterior Thigh Region: severe wasting Posterior Calf Region: severe wasting  Edema: none noted  Height: Ht Readings from Last 1 Encounters:  12/07/14 5\' 5"  (1.651 m)    Weight Status:   Wt Readings from Last 1 Encounters:  02/02/15 114 lb 6.7 oz (51.9 kg)   01/24/15 134 lb  14.7 oz (61.2 kg)   01/20/15 132 lb 11.2 oz (60.192 kg)   01/19/15 124 lb 11.2 oz (56.564 kg)               Re-estimated needs:  Kcal: 1444 Protein: 90-105 grams (up to 126 grams) Fluid: 1.9 L/day  Skin: stage II pressure ulcer on buttocks  Diet Order: Diet NPO time specified   Intake/Output Summary (Last 24 hours) at 02/02/15 1052 Last data filed at 02/02/15 1000  Gross per 24 hour  Intake 800.33 ml  Output    150 ml  Net 650.33 ml    Last BM: 02/01/15   Labs:   Recent Labs Lab 01/28/15 0345  01/29/15 0506  01/30/15 0551  01/31/15 0535 02/01/15 0445 02/02/15 0245  NA 137  < > 137  < > 137  < > 141 142 143  K 3.7  < > 3.6  < > 3.8  < > 4.1 3.7 3.7  CL 100  < > 96  < > 99  < > 105 105 107  CO2 27  < > 26  < > 25  < > 25 23 22   BUN 15  < > 12  < > 14  < > 29* 31* 50*  CREATININE 1.61*  < > 1.61*  < > 1.61*  < > 3.31* 4.23* 6.01*  CALCIUM 9.6  < > 10.5  < > 10.4  < > 10.9* 10.1 9.8  MG 2.3  --  2.6*  --  2.7*  --   --   --   --   PHOS 1.8*  < > 4.0  < > 3.3  < > 5.4* 4.9* 4.8*  GLUCOSE 110*  < > 128*  < > 152*  < > 135* 141* 118*  < > = values in this interval not displayed.  CBG (last 3)   Recent Labs  02/01/15 2356 02/02/15 0514 02/02/15 0837  GLUCAP 111* 122* 106*    Scheduled Meds: . antiseptic oral rinse  7 mL Mouth Rinse QID  . chlorhexidine  15 mL Mouth Rinse BID  . darbepoetin (ARANESP) injection - NON-DIALYSIS  60 mcg Subcutaneous Q Fri-1800  . etomidate  40 mg Intravenous Once  . famotidine (PEPCID) IV  20 mg Intravenous Q24H  . feeding supplement (VITAL HIGH PROTEIN)  1,000 mL Per Tube Q24H  . fentaNYL  200 mcg Intravenous Once  . folic acid  1 mg Oral Daily  . heparin subcutaneous  5,000 Units Subcutaneous 3 times per day  . hydroxychloroquine  400 mg Oral Daily  . labetalol  100 mg Per Tube 3 times per day  . midazolam  4 mg Intravenous Once  . multivitamin  1 tablet Oral QHS  . propofol  5-80 mcg/kg/min Intravenous  Once  . thiamine  100 mg Oral Daily  . vecuronium  10 mg Intravenous Once    Continuous Infusions: . dextrose 5 % and 0.9% NaCl 10 mL/hr at 01/31/15 2000  . fentaNYL infusion INTRAVENOUS 75 mcg/hr (02/02/15 0800)    Pryor Ochoa RD, LDN Inpatient Clinical Dietitian Pager: [phone] After Hours Pager: [phone]

## 2015-02-03 ENCOUNTER — Inpatient Hospital Stay (HOSPITAL_COMMUNITY): Payer: Managed Care, Other (non HMO)

## 2015-02-03 DIAGNOSIS — N186 End stage renal disease: Secondary | ICD-10-CM

## 2015-02-03 LAB — CULTURE, BAL-QUANTITATIVE

## 2015-02-03 LAB — GLUCOSE, CAPILLARY
GLUCOSE-CAPILLARY: 133 mg/dL — AB (ref 70–99)
Glucose-Capillary: 153 mg/dL — ABNORMAL HIGH (ref 70–99)
Glucose-Capillary: 125 mg/dL — ABNORMAL HIGH (ref 70–99)
Glucose-Capillary: 145 mg/dL — ABNORMAL HIGH (ref 70–99)
Glucose-Capillary: 163 mg/dL — ABNORMAL HIGH (ref 70–99)
Glucose-Capillary: 135 mg/dL — ABNORMAL HIGH (ref 70–99)

## 2015-02-03 LAB — RENAL FUNCTION PANEL
Albumin: 2 g/dL — ABNORMAL LOW (ref 3.5–5.2)
Anion gap: 10 (ref 5–15)
BUN: 69 mg/dL — AB (ref 6–23)
CHLORIDE: 110 mmol/L (ref 96–112)
CO2: 23 mmol/L (ref 19–32)
Calcium: 9.8 mg/dL (ref 8.4–10.5)
Creatinine, Ser: 7.47 mg/dL — ABNORMAL HIGH (ref 0.50–1.10)
GFR calc Af Amer: 6 mL/min — ABNORMAL LOW (ref >90)
GFR calc non Af Amer: 6 mL/min — ABNORMAL LOW (ref >90)
Glucose, Bld: 128 mg/dL — ABNORMAL HIGH (ref 70–99)
POTASSIUM: 3.7 mmol/L (ref 3.5–5.1)
Phosphorus: 5.6 mg/dL — ABNORMAL HIGH (ref 2.3–4.6)
Sodium: 143 mmol/L (ref 135–145)

## 2015-02-03 LAB — CBC
HCT: 27 % — ABNORMAL LOW (ref 36.0–46.0)
Hemoglobin: 8.5 g/dL — ABNORMAL LOW (ref 12.0–15.0)
MCH: 28.7 pg (ref 26.0–34.0)
MCHC: 31.5 g/dL (ref 30.0–36.0)
MCV: 91.2 fL (ref 78.0–100.0)
PLATELETS: 377 10*3/uL (ref 150–400)
RBC: 2.96 MIL/uL — AB (ref 3.87–5.11)
RDW: 17.8 % — ABNORMAL HIGH (ref 11.5–15.5)
WBC: 8.8 10*3/uL (ref 4.0–10.5)

## 2015-02-03 LAB — PHOSPHORUS: PHOSPHORUS: 5.5 mg/dL — AB (ref 2.3–4.6)

## 2015-02-03 LAB — MAGNESIUM
Magnesium: 2.7 mg/dL — ABNORMAL HIGH (ref 1.5–2.5)
Magnesium: 1.9 mg/dL (ref 1.5–2.5)

## 2015-02-03 MED ORDER — ALBUMIN HUMAN 25 % IV SOLN
12.5000 g | Freq: Once | INTRAVENOUS | Status: AC
  Administered 2015-02-03: 12.5 g via INTRAVENOUS
  Filled 2015-02-03: qty 50

## 2015-02-03 NOTE — Procedures (Signed)
I have personally attended this patient's dialysis session.  4K bath UF goal was only 1 liter but is not tolerating - UF off, saline bolus, prn alb Temp cath R IJ (For TDC early in the week)   Jamal Maes, MD [place] Pager 02/03/2015, 10:07 AM

## 2015-02-03 NOTE — Progress Notes (Signed)
Patient ID: [name] [name], female   DOB: 1961/08/23, 54 y.o.   MRN: [id number] Remains critically ill area discussed with pulmonary care and nephrology. We will plan tunneled catheter on Monday. Currently is being dialyzed via right IJ temporary catheter.  Vein mapping pending. Patient has very small surface veins. We will plan of long-term access after she has resolved this acute illness.

## 2015-02-03 NOTE — Progress Notes (Signed)
Subjective: Intubated.  More alert.  Objective: Current vital signs: BP 111/57 mmHg  Pulse 88  Temp(Src) 98.6 F (37 C) (Axillary)  Resp 15  Wt 53.7 kg (118 lb 6.2 oz)  SpO2 100%  LMP 07/18/2012 Vital signs in last 24 hours: Temp:  [98.1 F (36.7 C)-99.5 F (37.5 C)] 98.6 F (37 C) (04/02 0801) Pulse Rate:  [83-136] 88 (04/02 1015) Resp:  [12-24] 15 (04/02 1015) BP: (84-155)/(53-84) 111/57 mmHg (04/02 1015) SpO2:  [100 %] 100 % (04/02 1015) FiO2 (%):  [40 %] 40 % (04/02 0835) Weight:  [53.7 kg (118 lb 6.2 oz)] 53.7 kg (118 lb 6.2 oz) (04/02 0700)  Intake/Output from previous day: 04/01 0701 - 04/02 0700 In: 1305.4 [I.V.:490; NG/GT:575.4; IV Piggyback:220] Out: 150 [Stool:150] Intake/Output this shift: Total I/O In: 135 [I.V.:35; NG/GT:100] Out: -  Nutritional status: Diet NPO time specified  Neurologic Exam: Mental Status: Intubated. Follows commands.  Nods head in response to questioning.   Cranial Nerves: II: Discs flat bilaterally; Visual fields grossly normal, pupils equal, round, reactive to light and accommodation III,IV, VI: ptosis not present, tracks examiner V,VII: Corneals intact bilaterally VIII: hearing normal bilaterally IX,X: gag reflex decreased XI: shoulder shrug not tested XII: patient not following commands Motor: Moving upper extremities spontaneously and to command. Moves toes to command  Deep Tendon Reflexes: 2+ with absent AJ's bilaterally Plantars: Right: downgoingLeft: downgoing   Lab Results: Basic Metabolic Panel:  Recent Labs Lab 01/28/15 0345  01/29/15 0506  01/30/15 0551 01/30/15 1600 01/31/15 0535 02/01/15 0445 02/02/15 0245 02/02/15 1105 02/03/15 0001 02/03/15 0500  NA 137  < > 137  < > 137 137 141 142 143  --   --  143  K 3.7  < > 3.6  < > 3.8 3.8 4.1 3.7 3.7  --   --  3.7  CL 100  < > 96  < > 99 103 105 105 107  --   --  110  CO2 27  < > 26  < > 25 28 25 23 22   --   --  23   GLUCOSE 110*  < > 128*  < > 152* 131* 135* 141* 118*  --   --  128*  BUN 15  < > 12  < > 14 22 29* 31* 50*  --   --  69*  CREATININE 1.61*  < > 1.61*  < > 1.61* 2.12* 3.31* 4.23* 6.01*  --   --  7.47*  CALCIUM 9.6  < > 10.5  < > 10.4 10.3 10.9* 10.1 9.8  --   --  9.8  MG 2.3  --  2.6*  --  2.7*  --   --   --   --  2.5 2.7*  --   PHOS 1.8*  < > 4.0  < > 3.3 3.8 5.4* 4.9* 4.8* 4.7* 5.5* 5.6*  < > = values in this interval not displayed.  Liver Function Tests:  Recent Labs Lab 01/29/15 1520  01/30/15 1600 01/31/15 0535 02/01/15 0445 02/02/15 0245 02/03/15 0500  AST 49*  --   --   --   --   --   --   ALT 18  --   --   --   --   --   --   ALKPHOS 108  --   --   --   --   --   --   BILITOT 0.8  --   --   --   --   --   --  PROT 7.0  --   --   --   --   --   --   ALBUMIN 2.6*  2.7*  < > 2.7* 2.9* 2.4* 2.2* 2.0*  < > = values in this interval not displayed. No results for input(s): LIPASE, AMYLASE in the last 168 hours.  Recent Labs Lab 01/28/15 0950  AMMONIA 21    CBC:  Recent Labs Lab 01/29/15 0506 01/31/15 0535 02/01/15 0445 02/02/15 0245 02/03/15 0500  WBC 9.6 14.7* 21.1* 14.2* 8.8  HGB 10.1* 10.9* 9.5* 8.5* 8.5*  HCT 31.9* 35.9* 30.9* 27.9* 27.0*  MCV 88.4 91.8 91.4 90.9 91.2  PLT 365 458* 447* 389 377    Cardiac Enzymes: No results for input(s): CKTOTAL, CKMB, CKMBINDEX, TROPONINI in the last 168 hours.  Lipid Panel:  Recent Labs Lab 01/27/15 1541  TRIG 234*    CBG:  Recent Labs Lab 02/02/15 1632 02/02/15 1951 02/03/15 0040 02/03/15 0407 02/03/15 0757  GLUCAP 129* 117* 153* 125* 145*    Microbiology: Results for orders placed or performed during the hospital encounter of 01/18/15  MRSA PCR Screening     Status: None   Collection Time: 01/21/15 10:04 AM  Result Value Ref Range Status   MRSA by PCR NEGATIVE NEGATIVE Final    Comment:        The GeneXpert MRSA Assay (FDA approved for NASAL specimens only), is one component of  a comprehensive MRSA colonization surveillance program. It is not intended to diagnose MRSA infection nor to guide or monitor treatment for MRSA infections.   Culture, blood (routine x 2)     Status: None   Collection Time: 01/21/15 11:30 AM  Result Value Ref Range Status   Specimen Description BLOOD LEFT ASSIST CONTROL  Final   Special Requests BOTTLES DRAWN AEROBIC ONLY 8CC  Final   Culture   Final    NO GROWTH 5 DAYS Performed at [facility]    Report Status 01/27/2015 FINAL  Final  Culture, Urine     Status: None   Collection Time: 01/21/15  4:24 PM  Result Value Ref Range Status   Specimen Description URINE, CATHETERIZED  Final   Special Requests Immunocompromised  Final   Colony Count NO GROWTH Performed at [facility]   Final   Culture NO GROWTH Performed at [facility]   Final   Report Status 01/22/2015 FINAL  Final  Clostridium Difficile by PCR     Status: None   Collection Time: 01/27/15  9:38 AM  Result Value Ref Range Status   C difficile by pcr NEGATIVE NEGATIVE Final  Culture, bal-quantitative     Status: None (Preliminary result)   Collection Time: 01/31/15  3:11 PM  Result Value Ref Range Status   Specimen Description BRONCHIAL ALVEOLAR LAVAGE  Final   Special Requests Immunocompromised  Final   Gram Stain   Final    FEW WBC PRESENT,BOTH PMN AND MONONUCLEAR RARE SQUAMOUS EPITHELIAL CELLS PRESENT NO ORGANISMS SEEN Performed at [facility]   Final    20,OOO COLONIES/ML Performed at [facility]    Culture   Final    CANDIDA ALBICANS Performed at [facility]    Report Status PENDING  Incomplete  CSF culture     Status: None (Preliminary result)   Collection Time: 02/01/15 12:54 PM  Result Value Ref Range Status   Specimen Description CSF  Final   Special Requests Immunocompromised  Final  Gram Stain   Final    CYTOSPIN SLIDE WBC PRESENT, PREDOMINANTLY MONONUCLEAR NO  ORGANISMS SEEN Performed at [facility] Performed at [facility]    Culture   Final    NO GROWTH 1 DAY Performed at [facility]    Report Status PENDING  Incomplete  Gram stain - STAT with CSF culture     Status: None   Collection Time: 02/01/15 12:54 PM  Result Value Ref Range Status   Specimen Description CSF  Final   Special Requests Immunocompromised  Final   Gram Stain   Final    WBC PRESENT, PREDOMINANTLY MONONUCLEAR NO ORGANISMS SEEN CSF CYTOSPIN    Report Status 02/01/2015 FINAL  Final    Coagulation Studies:  Recent Labs  02/02/15 0245  LABPROT 16.2*  INR 1.29    Imaging: Dg Chest Port 1 View  02/03/2015   CLINICAL DATA:  Acute respiratory failureHx of collagen vascular disease, DM, HTN, stenosis of cervical spine region, chronic ankle pain, anemia  EXAM: PORTABLE CHEST - 1 VIEW  COMPARISON:  02/02/2015  FINDINGS: Residual opacity in the left lung base is similar to the previous day's study, likely atelectasis. Remainder of the lungs is clear. No pneumothorax. Possible minimal left effusion.  Bilateral internal jugular central venous lines, endotracheal tube and enteric tube are stable. Cardiac silhouette normal in size. No mediastinal hilar masses.  IMPRESSION: 1. No change from the previous day's study. 2. Mild residual left lung base opacity most likely atelectasis. Possible small left effusion. No convincing pneumonia or pulmonary edema. 3. Support apparatus is stable and well positioned.   Electronically Signed   By: Lajean Manes M.D.   On: 02/03/2015 09:19   Dg Chest Port 1 View  02/02/2015   CLINICAL DATA:  Hypoxia/respiratory failure  EXAM: PORTABLE CHEST - 1 VIEW  COMPARISON:  February 01, 2015  FINDINGS: Endotracheal tube tip is 5.4 cm above the carina. Feeding tube tip is below the diaphragm. Central catheter tips are in the superior vena cava. No pneumothorax. There is a small area of infiltrate in the left lower lobe, less than on 1 day  prior. Lungs elsewhere clear. Heart size and pulmonary vascularity are normal. No adenopathy. No bone lesions.  IMPRESSION: Partial but incomplete clearing of infiltrate from left lower lobe. No new opacity. No change in cardiac silhouette. Tube and catheter positions as described without pneumothorax.   Electronically Signed   By: Lowella Grip III M.D.   On: 02/02/2015 07:47   Dg Abd Portable 1v  02/01/2015   CLINICAL DATA:  Feeding tube placement.  EXAM: PORTABLE ABDOMEN - 1 VIEW  COMPARISON:  Earlier this day at 1029 hr  FINDINGS: The enteric tube has been repositioned, the weighted tip now in the region of the body directed caudally. Unchanged bowel gas pattern with normal caliber air-filled transverse colon. No free intra-abdominal air is seen.  IMPRESSION: The enteric tube repositioned, now in the region of the gastric body, directed caudally.   Electronically Signed   By: Jeb Levering M.D.   On: 02/01/2015 17:35   Dg Abd Portable 1v  02/01/2015   CLINICAL DATA:  Feeding catheter placement  EXAM: PORTABLE ABDOMEN - 1 VIEW  COMPARISON:  01/31/2015  FINDINGS: Feeding catheter is again noted within the stomach and kinked upon itself within the gastric lumen. The tip is noted adjacent to the gastroesophageal junction. The bowel gas pattern is stable. No free air is seen.  IMPRESSION: Feeding catheter as  described.   Electronically Signed   By: Inez Catalina M.D.   On: 02/01/2015 10:48    Medications:  I have reviewed the patient's current medications. Scheduled: . albumin human  12.5 g Intravenous Once  . antiseptic oral rinse  7 mL Mouth Rinse QID  . ceFEPime (MAXIPIME) IV  2 g Intravenous Once  . chlorhexidine  15 mL Mouth Rinse BID  . darbepoetin (ARANESP) injection - NON-DIALYSIS  60 mcg Subcutaneous Q Fri-1800  . famotidine (PEPCID) IV  20 mg Intravenous Q24H  . folic acid  1 mg Oral Daily  . heparin subcutaneous  5,000 Units Subcutaneous 3 times per day  . hydroxychloroquine  400  mg Oral Daily  . labetalol  200 mg Per Tube 3 times per day  . multivitamin  1 tablet Oral QHS  . mycophenolate (CELLCEPT) IV  1,000 mg Intravenous Q12H  . thiamine  100 mg Oral Daily    Assessment/Plan: Patient improved this morning but has been fluctuating.  LP results do nt show evidence of infection.  Fluid also sent for studies to rule out lupus cerebritis.  Recommendations: 1.  Will continue to follow with you     LOS: 15 days   Alexis Goodell, MD [facility] [phone] 02/03/2015  10:17 AM

## 2015-02-03 NOTE — Progress Notes (Signed)
PULMONARY / CRITICAL CARE MEDICINE   Name: [name] [name] MRN: [id number] DOB: September 25, 1961    ADMISSION DATE:  01/18/2015 CONSULTATION DATE:  3/20  REFERRING MD :  Eliseo Squires   CHIEF COMPLAINT:  Tachycardia, resp failure   INITIAL PRESENTATION:  54 y/o female with hx lupus (on cellcept, plaquenil) and associated CKD, DM, HTN initially presented 3/17 with vague c/o SOB, diarrhea, malaise, weakness, cough.  She was admitted by [facility] and r/o for flu.  Was undergoing further w/u for diarrhea, mildly elevated troponin and anemia.  Overnight 3/19 developed intermittent SVT, then persistent tachycardia with HR 150's and worsening SOB and hypoxia requiring NRB.  PCCM consulted 3/20.  She required ETT/MV 3/20. CXR with B infiltrates. Initiated CVVHD on 3/21 pm.   STUDIES:  CXR 3/20>>> neg  2D echo 3/19>>> [id number]%, grade 1 diastolic dysfunction, mod AR, mod MR, mod TR. No pericardial effusion.  LE doppler US 3/20 >> negative   SIGNIFICANT EVENTS: 3/21  Adenosine challenge >> appears to be sinus rhythm with some non-conducted p-waves 3/23  Tolerated CVVHD, I/O even last 24 h, tachycardia improved, fent + propofol 3/24  High residuals overnight, on max dose fentanyl.  Rx'd with Reglan  3/26  Continues to have agitation with WUA / SBT, changed back to propofol/fentanyl  3/28 seroquel trance, neuro consult 3/29 more awake, follows commands 3/30 reintubated, bronchoscopy - for LLL atx  SUBJECTIVE: afebrile, examined on HD - tachy , BP ok -no obvious pain More alert, secretions +  VITAL SIGNS: Temp:  [98.1 F (36.7 C)-99.5 F (37.5 C)] 98.6 F (37 C) (04/02 0801) Pulse Rate:  [88-136] 91 (04/02 0900) Resp:  [12-24] 12 (04/02 0900) BP: (96-155)/(57-84) 100/57 mmHg (04/02 0900) SpO2:  [100 %] 100 % (04/02 0900) FiO2 (%):  [40 %] 40 % (04/02 0835) Weight:  [53.7 kg (118 lb 6.2 oz)] 53.7 kg (118 lb 6.2 oz) (04/02 0700)   VENTILATOR SETTINGS: Vent Mode:  [-] PSV FiO2 (%):  [40 %] 40 % Set Rate:   [16 bmp] 16 bmp Vt Set:  [480 mL] 480 mL PEEP:  [5 cmH20] 5 cmH20 Pressure Support:  [5 cmH20] 5 cmH20 Plateau Pressure:  [18 cmH20-29 cmH20] 18 cmH20   INTAKE / OUTPUT:  Intake/Output Summary (Last 24 hours) at 02/03/15 0916 Last data filed at 02/03/15 0900  Gross per 24 hour  Intake 1352.92 ml  Output      0 ml  Net 1352.92 ml    PHYSICAL EXAMINATION: General:  Chronically ill appearing female Neuro:   follows commands , more alert HEENT:  MM dry Cardiovascular:  s1s2 tachy  Lungs:  Clear bilaterally   Abdomen:  round, mildly distended, soft, BSx4 active   Musculoskeletal:  Warm and dry, no edema  LABS:  CBC  Recent Labs Lab 02/01/15 0445 02/02/15 0245 02/03/15 0500  WBC 21.1* 14.2* 8.8  HGB 9.5* 8.5* 8.5*  HCT 30.9* 27.9* 27.0*  PLT 447* 389 377   Coag's  Recent Labs Lab 01/29/15 0506 01/30/15 0551 02/02/15 0245  APTT >200* 136* 46*  INR  --   --  1.29    BMET  Recent Labs Lab 02/01/15 0445 02/02/15 0245 02/03/15 0500  NA 142 143 143  K 3.7 3.7 3.7  CL 105 107 110  CO2 23 22 23   BUN 31* 50* 69*  CREATININE 4.23* 6.01* 7.47*  GLUCOSE 141* 118* 128*   Electrolytes  Recent Labs Lab 01/30/15 0551  02/01/15 0445 02/02/15 0245 02/02/15 1105 02/03/15 0001  02/03/15 0500  CALCIUM 10.4  < > 10.1 9.8  --   --  9.8  MG 2.7*  --   --   --  2.5 2.7*  --   PHOS 3.3  < > 4.9* 4.8* 4.7* 5.5* 5.6*  < > = values in this interval not displayed. Sepsis Markers  Recent Labs Lab 01/28/15 0345  PROCALCITON 0.26     ABG  Recent Labs Lab 01/31/15 1429 01/31/15 1700 01/31/15 1725  PHART 7.383 7.303* 7.381  PCO2ART 34.3* 45.8* 36.4  PO2ART 52.0* 18.0* 208.0*   Liver Enzymes  Recent Labs Lab 01/29/15 1520  02/01/15 0445 02/02/15 0245 02/03/15 0500  AST 49*  --   --   --   --   ALT 18  --   --   --   --   ALKPHOS 108  --   --   --   --   BILITOT 0.8  --   --   --   --   ALBUMIN 2.6*  2.7*  < > 2.4* 2.2* 2.0*  < > = values in this  interval not displayed.   Cardiac Enzymes No results for input(s): TROPONINI, PROBNP in the last 168 hours. Glucose  Recent Labs Lab 02/02/15 1244 02/02/15 1632 02/02/15 1951 02/03/15 0040 02/03/15 0407 02/03/15 0757  GLUCAP 104* 129* 117* 153* 125* 145*    Imaging Dg Chest Port 1 View  02/02/2015   CLINICAL DATA:  Hypoxia/respiratory failure  EXAM: PORTABLE CHEST - 1 VIEW  COMPARISON:  February 01, 2015  FINDINGS: Endotracheal tube tip is 5.4 cm above the carina. Feeding tube tip is below the diaphragm. Central catheter tips are in the superior vena cava. No pneumothorax. There is a small area of infiltrate in the left lower lobe, less than on 1 day prior. Lungs elsewhere clear. Heart size and pulmonary vascularity are normal. No adenopathy. No bone lesions.  IMPRESSION: Partial but incomplete clearing of infiltrate from left lower lobe. No new opacity. No change in cardiac silhouette. Tube and catheter positions as described without pneumothorax.   Electronically Signed   By: Lowella Grip III M.D.   On: 02/02/2015 07:47    ASSESSMENT / PLAN:  PULMONARY Acute hypoxic respiratory failure - unknown etiology but in setting B infiltrates.  -treated for HCAP, PE (d/c'd heparin 3/23). Consider acute pulmonary edema in setting HTN and renal failure-  infiltrates have completely cleared. Most likely cause is cardiogenic edema.  LLL atx s/p bronch 3/30 -resolved ETT 3/20 >> 3/29 reintubated 3/30 >>  P:   Daily WUA / SBT  Perc trach deferred to next week    CARDIOVASCULAR SVT/sinus tachycardia - HR was 150 at time of her acute decompensation, Adenosine given, appeared to be sinus with some non-conducted p-waves. Presumed reactive to critical illness Hypotension Mildly elevated troponin peak 0.74 dCHF - new.  EF 40-45% with MOD aortic, tricuspid and mitral regurg.  ??PE - BLE venous dopplers prelim negative; doubt PE (heparin gtt stopped 3/23) P:  Dc Hydralazine PRN  Ct  labetalol  200 mg tID (reduced home dose) -via NGT Hold home clonidine Use lopressor prn for HR 130 & above  RENAL Acute on chronic renal failure, presume lupus nephritis.  Hyponatremia  Metabolic acidosis P:   Appreciate renal service's assistance Off CRRT, Tolerating iHD - she is not expected to recover her renal fxn, will need permacath -  planned for Monday  GASTROINTESTINAL Diarrhea - initial resolved.  New with reglan 3/26, flexi  seal placed.  Abd pain - neg abd xray  High Gastric Residuals Protein Calorie Malnutrition  P:   ct TF  dc'd reglan  Flexi-seal 3/26 Pepcid    HEMATOLOGIC Leukocytosis  Anemia - s/p 1 unit PRBC 3/22 P:  Cont iron    INFECTIOUS Fever - resolved  Immunocompromised Host Diarrhea - resolved Unclear etiology of infection, consider PNA in immunocompromised host but CXR has quickly cleared.  ??GI given diarrhea v viral v other source (ie lupus pneumonitis).  Immunocompromised on rx for lupus.  P:   BCx2 3/20 >>>neg UC 3/20 >>> negative BAL 3/30 >> candida  Vanc 3/20 (empiric) >>> 3/22, 3/30 >>4/2 Zosyn 3/20 (empiric) >>>3/28 Cefepime 3/30 >> (LLL atx) 4/3 planned  OK to resume cellcept & plaquenil   ENDOCRINE No active issue    P:   Monitor glucose   NEUROLOGIC Acute encephalopathy -fluctuating- Head Ct neg 9/52 EEG 8/41 metabolic enceph MRI 3/24 >> Remote RIGHT inferior brainstem hemorrhage, stable RIGHT basal ganglia micro hemorrhage, Remote small RIGHT inferior cerebellar infarct. CSF 3/31 neg P:   RASS goal: 0  Fent gtt    FAMILY  - Updates: Mother & sister updated daily  Summary -Fluctuating mental status main issue here - doubt lupus cerebritis given bland CSF, Plan for trach next week unless dramatic improvement  The patient is critically ill with multiple organ systems failure and requires high complexity decision making for assessment and support, frequent evaluation and titration of therapies, application of advanced  monitoring technologies and extensive interpretation of multiple databases. Critical Care Time devoted to patient care services described in this note independent of APP time is 35 minutes.    Kara Mead MD. [facility] Flood. [facility] Pager [phone] If no response call [phone]    02/03/2015, 9:16 AM

## 2015-02-03 NOTE — Progress Notes (Addendum)
[facility] ROUNDING NOTE   Subjective:   Remains on vent Not requiring pressors Tachycardia better MS still an issue Transitioning to HD To get TDC probably on Monday On HD now - just dropped BP into 80's - UF off and giving back saline + alb Try to keep SBP over 100  Objective:  Vital signs in last 24 hours:  Temp:  [98.1 F (36.7 C)-99.5 F (37.5 C)] 98.6 F (37 C) (04/02 0801) Pulse Rate:  [88-136] 91 (04/02 0945) Resp:  [12-24] 14 (04/02 0945) BP: (84-155)/(53-84) 92/57 mmHg (04/02 0945) SpO2:  [100 %] 100 % (04/02 0945) FiO2 (%):  [40 %] 40 % (04/02 0835) Weight:  [53.7 kg (118 lb 6.2 oz)] 53.7 kg (118 lb 6.2 oz) (04/02 0700)  Weight change: 1.8 kg (3 lb 15.5 oz) Filed Weights   02/01/15 0200 02/02/15 0500 02/03/15 0700  Weight: 51.2 kg (112 lb 14 oz) 51.9 kg (114 lb 6.7 oz) 53.7 kg (118 lb 6.2 oz)   Physical Examination BP 92/57 mmHg  Pulse 91  Temp(Src) 98.6 F (37 C) (Axillary)  Resp 14  Wt 53.7 kg (118 lb 6.2 oz)  SpO2 100%  LMP 07/18/2012 Not following commands for me but opens eyes R IJ dialysis line dressing intact and line in use fo HD Lungs clear laterally and anteriorly S1S2 No S3 HR 80's Abd soft, not tender SCD's in place and no edema LE's   Weight trending: (max weight this hospitalization around 60 kg) 3/28 52.6 3/29 52.4 3/30 50.5 (?) 3/31 51.2 kg 4/01 51.9 kg 4/02 53.7 pre HD   Recent Labs Lab 01/28/15 0345  01/29/15 0506  01/30/15 0551 01/30/15 1600 01/31/15 0535 02/01/15 0445 02/02/15 0245 02/02/15 1105 02/03/15 0001 02/03/15 0500  NA 137  < > 137  < > 137 137 141 142 143  --   --  143  K 3.7  < > 3.6  < > 3.8 3.8 4.1 3.7 3.7  --   --  3.7  CL 100  < > 96  < > 99 103 105 105 107  --   --  110  CO2 27  < > 26  < > 25 28 25 23 22   --   --  23  GLUCOSE 110*  < > 128*  < > 152* 131* 135* 141* 118*  --   --  128*  BUN 15  < > 12  < > 14 22 29* 31* 50*  --   --  69*  CREATININE 1.61*  < > 1.61*  < > 1.61*  2.12* 3.31* 4.23* 6.01*  --   --  7.47*  CALCIUM 9.6  < > 10.5  < > 10.4 10.3 10.9* 10.1 9.8  --   --  9.8  MG 2.3  --  2.6*  --  2.7*  --   --   --   --  2.5 2.7*  --   PHOS 1.8*  < > 4.0  < > 3.3 3.8 5.4* 4.9* 4.8* 4.7* 5.5* 5.6*  < > = values in this interval not displayed.   Recent Labs Lab 01/29/15 1520  01/30/15 1600 01/31/15 0535 02/01/15 0445 02/02/15 0245 02/03/15 0500  AST 49*  --   --   --   --   --   --   ALT 18  --   --   --   --   --   --   ALKPHOS 108  --   --   --   --   --   --  BILITOT 0.8  --   --   --   --   --   --   PROT 7.0  --   --   --   --   --   --   ALBUMIN 2.6*  2.7*  < > 2.7* 2.9* 2.4* 2.2* 2.0*  < > = values in this interval not displayed. No results for input(s): LIPASE, AMYLASE in the last 168 hours.  Recent Labs Lab 01/28/15 0950  AMMONIA 21    CBC:  Recent Labs Lab 01/29/15 0506 01/31/15 0535 02/01/15 0445 02/02/15 0245 02/03/15 0500  WBC 9.6 14.7* 21.1* 14.2* 8.8  HGB 10.1* 10.9* 9.5* 8.5* 8.5*  HCT 31.9* 35.9* 30.9* 27.9* 27.0*  MCV 88.4 91.8 91.4 90.9 91.2  PLT 365 458* 447* 389 377    CBG:  Recent Labs Lab 02/02/15 1632 02/02/15 1951 02/03/15 0040 02/03/15 0407 02/03/15 0757  GLUCAP 129* 117* 153* 125* 145*    Imaging: Dg Chest Port 1 View  02/03/2015   CLINICAL DATA:  Acute respiratory failureHx of collagen vascular disease, DM, HTN, stenosis of cervical spine region, chronic ankle pain, anemia  EXAM: PORTABLE CHEST - 1 VIEW  COMPARISON:  02/02/2015  FINDINGS: Residual opacity in the left lung base is similar to the previous day's study, likely atelectasis. Remainder of the lungs is clear. No pneumothorax. Possible minimal left effusion.  Bilateral internal jugular central venous lines, endotracheal tube and enteric tube are stable. Cardiac silhouette normal in size. No mediastinal hilar masses.  IMPRESSION: 1. No change from the previous day's study. 2. Mild residual left lung base opacity most likely atelectasis.  Possible small left effusion. No convincing pneumonia or pulmonary edema. 3. Support apparatus is stable and well positioned.   Electronically Signed   By: Lajean Manes M.D.   On: 02/03/2015 09:19   Dg Chest Port 1 View  02/02/2015   CLINICAL DATA:  Hypoxia/respiratory failure  EXAM: PORTABLE CHEST - 1 VIEW  COMPARISON:  February 01, 2015  FINDINGS: Endotracheal tube tip is 5.4 cm above the carina. Feeding tube tip is below the diaphragm. Central catheter tips are in the superior vena cava. No pneumothorax. There is a small area of infiltrate in the left lower lobe, less than on 1 day prior. Lungs elsewhere clear. Heart size and pulmonary vascularity are normal. No adenopathy. No bone lesions.  IMPRESSION: Partial but incomplete clearing of infiltrate from left lower lobe. No new opacity. No change in cardiac silhouette. Tube and catheter positions as described without pneumothorax.   Electronically Signed   By: Lowella Grip III M.D.   On: 02/02/2015 07:47   Dg Abd Portable 1v  02/01/2015   CLINICAL DATA:  Feeding tube placement.  EXAM: PORTABLE ABDOMEN - 1 VIEW  COMPARISON:  Earlier this day at 1029 hr  FINDINGS: The enteric tube has been repositioned, the weighted tip now in the region of the body directed caudally. Unchanged bowel gas pattern with normal caliber air-filled transverse colon. No free intra-abdominal air is seen.  IMPRESSION: The enteric tube repositioned, now in the region of the gastric body, directed caudally.   Electronically Signed   By: Jeb Levering M.D.   On: 02/01/2015 17:35   Dg Abd Portable 1v  02/01/2015   CLINICAL DATA:  Feeding catheter placement  EXAM: PORTABLE ABDOMEN - 1 VIEW  COMPARISON:  01/31/2015  FINDINGS: Feeding catheter is again noted within the stomach and kinked upon itself within the gastric lumen. The tip is noted  adjacent to the gastroesophageal junction. The bowel gas pattern is stable. No free air is seen.  IMPRESSION: Feeding catheter as described.    Electronically Signed   By: Inez Catalina M.D.   On: 02/01/2015 10:48     Medications:   . dextrose 5 % and 0.9% NaCl 10 mL/hr at 01/31/15 2000  . feeding supplement (VITAL AF 1.2 CAL) 1,000 mL (02/03/15 0600)  . fentaNYL infusion INTRAVENOUS 150 mcg/hr (02/02/15 2100)   . albumin human  12.5 g Intravenous Once  . antiseptic oral rinse  7 mL Mouth Rinse QID  . ceFEPime (MAXIPIME) IV  2 g Intravenous Once  . chlorhexidine  15 mL Mouth Rinse BID  . darbepoetin (ARANESP) injection - NON-DIALYSIS  60 mcg Subcutaneous Q Fri-1800  . famotidine (PEPCID) IV  20 mg Intravenous Q24H  . folic acid  1 mg Oral Daily  . heparin subcutaneous  5,000 Units Subcutaneous 3 times per day  . hydroxychloroquine  400 mg Oral Daily  . labetalol  200 mg Per Tube 3 times per day  . multivitamin  1 tablet Oral QHS  . mycophenolate (CELLCEPT) IV  1,000 mg Intravenous Q12H  . 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 AKI on CKD5/new ESRD - CRRT 3/22-3/29  (temp cath placed 3/21)  Undergoing hemodialysis today. Goal to keep SBP over 100. Did not tolerate 1 liter of UF (BP drop to 80's). UF off. BP coming back up. To have [place] placed probably Monday. Permanent access when more stable. Do not expect any recovery of kidney function. 2. Anemia - s/p transfusion 01/23/15. Fe studies normal on 01/05/15. On Darbe 150  QFriday and Hb was rising so we had empirically reduced dose to 40 but with continued subsequent decline up to 60.  3. Secondary hyperpara - last PTH  > 1000 (1171) on 01/23/15 Phos rising some off CRRT. When has tube back in or able to swallow will need non-calcium based binder and sensipar (d/t corrected hypercalcemia) 4. Recurrent resp failure - Reintubated 3/30. Off ATB's now. Partial clearing of LLL process and otherwise clear. ? Trach early in the week. Per CCM 5. Sustained tachycardia - on scheduled labetolol plus prn metoprolol - improved. ECHO EF 40-45% with mod AI, MR, TR 6. HTN - See  #5 7. Diarrhea - rectal tube in place. Black stool.Prev studies neg for CDiff 8. SLE - Back on cellcept and plaquenil. 9. AMS -  MRI nothing acute (remote RIGHT inferior brainstem hemorrhage, stable RIGHT basalganglia micro hemorrhage, remote small right inferior cerebellar infarct.old hemorrhages) and EEG just diffuse slowing. LP looked unremarkable. Await further recs from neuro.  10. Malnutrition - tube feeds   Jamal Maes, MD [facility] [phone] Pager 02/03/2015, 9:56 AM

## 2015-02-04 ENCOUNTER — Inpatient Hospital Stay (HOSPITAL_COMMUNITY): Payer: Managed Care, Other (non HMO)

## 2015-02-04 DIAGNOSIS — N189 Chronic kidney disease, unspecified: Secondary | ICD-10-CM

## 2015-02-04 DIAGNOSIS — N179 Acute kidney failure, unspecified: Secondary | ICD-10-CM

## 2015-02-04 LAB — RENAL FUNCTION PANEL
ALBUMIN: 2.2 g/dL — AB (ref 3.5–5.2)
ANION GAP: 15 (ref 5–15)
BUN: 31 mg/dL — ABNORMAL HIGH (ref 6–23)
CO2: 23 mmol/L (ref 19–32)
Calcium: 9.5 mg/dL (ref 8.4–10.5)
Chloride: 100 mmol/L (ref 96–112)
Creatinine, Ser: 4.12 mg/dL — ABNORMAL HIGH (ref 0.50–1.10)
GFR calc Af Amer: 13 mL/min — ABNORMAL LOW (ref >90)
GFR calc non Af Amer: 11 mL/min — ABNORMAL LOW (ref >90)
Glucose, Bld: 154 mg/dL — ABNORMAL HIGH (ref 70–99)
Phosphorus: 3.4 mg/dL (ref 2.3–4.6)
Potassium: 3.8 mmol/L (ref 3.5–5.1)
SODIUM: 138 mmol/L (ref 135–145)

## 2015-02-04 LAB — CBC
HCT: 26.1 % — ABNORMAL LOW (ref 36.0–46.0)
Hemoglobin: 7.9 g/dL — ABNORMAL LOW (ref 12.0–15.0)
MCH: 27.5 pg (ref 26.0–34.0)
MCHC: 30.3 g/dL (ref 30.0–36.0)
MCV: 90.9 fL (ref 78.0–100.0)
Platelets: 371 10*3/uL (ref 150–400)
RBC: 2.87 MIL/uL — AB (ref 3.87–5.11)
RDW: 17.5 % — ABNORMAL HIGH (ref 11.5–15.5)
WBC: 7.1 10*3/uL (ref 4.0–10.5)

## 2015-02-04 LAB — GLUCOSE, CAPILLARY
GLUCOSE-CAPILLARY: 135 mg/dL — AB (ref 70–99)
GLUCOSE-CAPILLARY: 141 mg/dL — AB (ref 70–99)
GLUCOSE-CAPILLARY: 130 mg/dL — AB (ref 70–99)
GLUCOSE-CAPILLARY: 134 mg/dL — AB (ref 70–99)
Glucose-Capillary: 137 mg/dL — ABNORMAL HIGH (ref 70–99)
Glucose-Capillary: 145 mg/dL — ABNORMAL HIGH (ref 70–99)

## 2015-02-04 LAB — PHOSPHORUS
PHOSPHORUS: 3.4 mg/dL (ref 2.3–4.6)
Phosphorus: 3.4 mg/dL (ref 2.3–4.6)

## 2015-02-04 LAB — MAGNESIUM
Magnesium: 2 mg/dL (ref 1.5–2.5)
Magnesium: 2 mg/dL (ref 1.5–2.5)

## 2015-02-04 MED ORDER — DARBEPOETIN ALFA 150 MCG/0.3ML IJ SOSY
150.0000 ug | PREFILLED_SYRINGE | INTRAMUSCULAR | Status: DC
  Administered 2015-02-06: 150 ug via INTRAVENOUS
  Filled 2015-02-04 (×2): qty 0.3

## 2015-02-04 NOTE — Progress Notes (Signed)
Subjective: Patient much improved this morning cognitively.    Objective: Current vital signs: BP 108/58 mmHg  Pulse 107  Temp(Src) 99.1 F (37.3 C) (Oral)  Resp 21  Wt 51.4 kg (113 lb 5.1 oz)  SpO2 100%  LMP 07/18/2012 Vital signs in last 24 hours: Temp:  [97.9 F (36.6 C)-99.7 F (37.6 C)] 99.1 F (37.3 C) (04/03 0357) Pulse Rate:  [80-130] 107 (04/03 0756) Resp:  [12-30] 21 (04/03 0756) BP: (84-121)/(50-67) 108/58 mmHg (04/03 0756) SpO2:  [100 %] 100 % (04/03 0756) FiO2 (%):  [40 %] 40 % (04/03 0756) Weight:  [51.4 kg (113 lb 5.1 oz)-53.2 kg (117 lb 4.6 oz)] 51.4 kg (113 lb 5.1 oz) (04/03 0200)  Intake/Output from previous day: 04/02 0701 - 04/03 0700 In: 2289.3 [I.V.:489.3; NG/GT:1410; IV Piggyback:390] Out: 099 [IPJAS:505] Intake/Output this shift:   Nutritional status: Diet NPO time specified  Neurologic Exam: Mental Status: Intubated. Follows commands. Puts up two fingers to command.  Nods head in response to questioning.  Cranial Nerves: II: Discs flat bilaterally; Visual fields grossly normal, pupils equal, round, reactive to light and accommodation III,IV, VI: ptosis not present, tracks examiner V,VII: Corneals intact bilaterally VIII: hearing normal bilaterally IX,X: gag reflex decreased XI: shoulder shrug not tested XII: not tested Motor: Moving upper extremities spontaneously and to command. Moves feet to command Deep Tendon Reflexes: 2+ with absent AJ's bilaterally Plantars: Right: downgoingLeft: downgoing  Lab Results: Basic Metabolic Panel:  Recent Labs Lab 01/31/15 0535 02/01/15 0445 02/02/15 0245 02/02/15 1105 02/03/15 0001 02/03/15 0500 02/03/15 1105 02/03/15 2305 02/04/15 0435  NA 141 142 143  --   --  143  --   --  138  K 4.1 3.7 3.7  --   --  3.7  --   --  3.8  CL 105 105 107  --   --  110  --   --  100  CO2 25 23 22   --   --  23  --   --  23  GLUCOSE 135* 141* 118*  --   --  128*  --   --   154*  BUN 29* 31* 50*  --   --  69*  --   --  31*  CREATININE 3.31* 4.23* 6.01*  --   --  7.47*  --   --  4.12*  CALCIUM 10.9* 10.1 9.8  --   --  9.8  --   --  9.5  MG  --   --   --  2.5 2.7*  --  1.9 2.0 2.0  PHOS 5.4* 4.9* 4.8* 4.7* 5.5* 5.6* <1.0* 3.4 3.4  3.4    Liver Function Tests:  Recent Labs Lab 01/29/15 1520  01/31/15 0535 02/01/15 0445 02/02/15 0245 02/03/15 0500 02/04/15 0435  AST 49*  --   --   --   --   --   --   ALT 18  --   --   --   --   --   --   ALKPHOS 108  --   --   --   --   --   --   BILITOT 0.8  --   --   --   --   --   --   PROT 7.0  --   --   --   --   --   --   ALBUMIN 2.6*  2.7*  < > 2.9* 2.4* 2.2* 2.0* 2.2*  < > =  values in this interval not displayed. No results for input(s): LIPASE, AMYLASE in the last 168 hours.  Recent Labs Lab 01/28/15 0950  AMMONIA 21    CBC:  Recent Labs Lab 01/31/15 0535 02/01/15 0445 02/02/15 0245 02/03/15 0500 02/04/15 0500  WBC 14.7* 21.1* 14.2* 8.8 7.1  HGB 10.9* 9.5* 8.5* 8.5* 7.9*  HCT 35.9* 30.9* 27.9* 27.0* 26.1*  MCV 91.8 91.4 90.9 91.2 90.9  PLT 458* 447* 389 377 371    Cardiac Enzymes: No results for input(s): CKTOTAL, CKMB, CKMBINDEX, TROPONINI in the last 168 hours.  Lipid Panel: No results for input(s): CHOL, TRIG, HDL, CHOLHDL, VLDL, LDLCALC in the last 168 hours.  CBG:  Recent Labs Lab 02/03/15 1213 02/03/15 1607 02/03/15 2027 02/03/15 2335 02/04/15 0355  GLUCAP 163* 133* 135* 145* 58*    Microbiology: Results for orders placed or performed during the hospital encounter of 01/18/15  MRSA PCR Screening     Status: None   Collection Time: 01/21/15 10:04 AM  Result Value Ref Range Status   MRSA by PCR NEGATIVE NEGATIVE Final    Comment:        The GeneXpert MRSA Assay (FDA approved for NASAL specimens only), is one component of a comprehensive MRSA colonization surveillance program. It is not intended to diagnose MRSA infection nor to guide or monitor treatment  for MRSA infections.   Culture, blood (routine x 2)     Status: None   Collection Time: 01/21/15 11:30 AM  Result Value Ref Range Status   Specimen Description BLOOD LEFT ASSIST CONTROL  Final   Special Requests BOTTLES DRAWN AEROBIC ONLY [place]  Final   Culture   Final    NO GROWTH 5 DAYS Performed at [facility]    Report Status 01/27/2015 FINAL  Final  Culture, Urine     Status: None   Collection Time: 01/21/15  4:24 PM  Result Value Ref Range Status   Specimen Description URINE, CATHETERIZED  Final   Special Requests Immunocompromised  Final   Colony Count NO GROWTH Performed at [facility]   Final   Culture NO GROWTH Performed at [facility]   Final   Report Status 01/22/2015 FINAL  Final  Clostridium Difficile by PCR     Status: None   Collection Time: 01/27/15  9:38 AM  Result Value Ref Range Status   C difficile by pcr NEGATIVE NEGATIVE Final  Culture, bal-quantitative     Status: None   Collection Time: 01/31/15  3:11 PM  Result Value Ref Range Status   Specimen Description BRONCHIAL ALVEOLAR LAVAGE  Final   Special Requests Immunocompromised  Final   Gram Stain   Final    FEW WBC PRESENT,BOTH PMN AND MONONUCLEAR RARE SQUAMOUS EPITHELIAL CELLS PRESENT NO ORGANISMS SEEN Performed at [place]   Final    20,OOO COLONIES/ML Performed at [facility]    Culture   Final    CANDIDA ALBICANS Performed at [facility]    Report Status 02/03/2015 FINAL  Final  CSF culture     Status: None (Preliminary result)   Collection Time: 02/01/15 12:54 PM  Result Value Ref Range Status   Specimen Description CSF  Final   Special Requests Immunocompromised  Final   Gram Stain   Final    CYTOSPIN SLIDE WBC PRESENT, PREDOMINANTLY MONONUCLEAR NO ORGANISMS SEEN Performed at [facility] Performed at [place]  Final    NO GROWTH 1 DAY Performed at [facility]     Report Status PENDING  Incomplete  Gram stain - STAT with CSF culture     Status: None   Collection Time: 02/01/15 12:54 PM  Result Value Ref Range Status   Specimen Description CSF  Final   Special Requests Immunocompromised  Final   Gram Stain   Final    WBC PRESENT, PREDOMINANTLY MONONUCLEAR NO ORGANISMS SEEN CSF CYTOSPIN    Report Status 02/01/2015 FINAL  Final    Coagulation Studies:  Recent Labs  02/02/15 0245  LABPROT 16.2*  INR 1.29    Imaging: Dg Chest Port 1 View  02/03/2015   CLINICAL DATA:  Acute respiratory failureHx of collagen vascular disease, DM, HTN, stenosis of cervical spine region, chronic ankle pain, anemia  EXAM: PORTABLE CHEST - 1 VIEW  COMPARISON:  02/02/2015  FINDINGS: Residual opacity in the left lung base is similar to the previous day's study, likely atelectasis. Remainder of the lungs is clear. No pneumothorax. Possible minimal left effusion.  Bilateral internal jugular central venous lines, endotracheal tube and enteric tube are stable. Cardiac silhouette normal in size. No mediastinal hilar masses.  IMPRESSION: 1. No change from the previous day's study. 2. Mild residual left lung base opacity most likely atelectasis. Possible small left effusion. No convincing pneumonia or pulmonary edema. 3. Support apparatus is stable and well positioned.   Electronically Signed   By: Lajean Manes M.D.   On: 02/03/2015 09:19    Medications:  I have reviewed the patient's current medications. Scheduled: . antiseptic oral rinse  7 mL Mouth Rinse QID  . chlorhexidine  15 mL Mouth Rinse BID  . darbepoetin (ARANESP) injection - NON-DIALYSIS  60 mcg Subcutaneous Q Fri-1800  . famotidine (PEPCID) IV  20 mg Intravenous Q24H  . folic acid  1 mg Oral Daily  . heparin subcutaneous  5,000 Units Subcutaneous 3 times per day  . hydroxychloroquine  400 mg Oral Daily  . labetalol  200 mg Per Tube 3 times per day  . multivitamin  1 tablet Oral QHS  . mycophenolate (CELLCEPT)  IV  1,000 mg Intravenous Q12H  . thiamine  100 mg Oral Daily    Assessment/Plan: Patient more alert and interactive.  Results of LP remain pending with additional studies added to rule out CNS involvement of lupus.    Recommendations: 1.  Will continue to follow up LP results.  If unremarkable no further neurological intervention recommended.     LOS: 16 days   Alexis Goodell, MD [facility] [phone] 02/04/2015  8:00 AM

## 2015-02-04 NOTE — Progress Notes (Signed)
Patient ID: [name] [name], female   DOB: 08-28-1961, 54 y.o.   MRN: [id number] Alert on vent.  Explained plan for tunnelled cath tomorrow.

## 2015-02-04 NOTE — Progress Notes (Signed)
PULMONARY / CRITICAL CARE MEDICINE   Name: [name] [name] MRN: [id number] DOB: September 21, 1961    ADMISSION DATE:  01/18/2015 CONSULTATION DATE:  3/20  REFERRING MD :  Eliseo Squires   CHIEF COMPLAINT:  Tachycardia, resp failure   INITIAL PRESENTATION:  54 y/o female with hx lupus (on cellcept, plaquenil) and associated CKD, DM, HTN initially presented 3/17 with vague c/o SOB, diarrhea, malaise, weakness, cough.  She was admitted by [facility] and r/o for flu.  Was undergoing further w/u for diarrhea, mildly elevated troponin and anemia.  Overnight 3/19 developed intermittent SVT, then persistent tachycardia with HR 150's and worsening SOB and hypoxia requiring NRB.  PCCM consulted 3/20.  She required ETT/MV 3/20. CXR with B infiltrates. Initiated CVVHD on 3/21 pm.   STUDIES:  CXR 3/20>>> neg  2D echo 3/19>>> NW29-56%, grade 1 diastolic dysfunction, mod AR, mod MR, mod TR. No pericardial effusion.  LE doppler US 3/20 >> negative   SIGNIFICANT EVENTS: 3/21  Adenosine challenge >> appears to be sinus rhythm with some non-conducted p-waves 3/23  Tolerated CVVHD, I/O even last 24 h, tachycardia improved, fent + propofol 3/24  High residuals overnight, on max dose fentanyl.  Rx'd with Reglan  3/26  Continues to have agitation with WUA / SBT, changed back to propofol/fentanyl  3/28 seroquel trance, neuro consult 3/29 more awake, follows commands 3/30 reintubated, bronchoscopy - for LLL atx  SUBJECTIVE: afebrile - tachy , BP ok -no obvious pain More alert, secretions decreased  VITAL SIGNS: Temp:  [97.9 F (36.6 C)-99.7 F (37.6 C)] 99.3 F (37.4 C) (04/03 0806) Pulse Rate:  [80-130] 107 (04/03 0800) Resp:  [13-30] 21 (04/03 0800) BP: (94-137)/(50-67) 137/64 mmHg (04/03 0800) SpO2:  [100 %] 100 % (04/03 0800) FiO2 (%):  [40 %] 40 % (04/03 0756) Weight:  [51.4 kg (113 lb 5.1 oz)-53.2 kg (117 lb 4.6 oz)] 51.4 kg (113 lb 5.1 oz) (04/03 0200)   VENTILATOR SETTINGS: Vent Mode:  [-] PSV FiO2 (%):  [40  %] 40 % Set Rate:  [16 bmp] 16 bmp Vt Set:  [480 mL] 480 mL PEEP:  [5 cmH20] 5 cmH20 Pressure Support:  [5 cmH20-10 cmH20] 5 cmH20 Plateau Pressure:  [18 cmH20-21 cmH20] 19 cmH20   INTAKE / OUTPUT:  Intake/Output Summary (Last 24 hours) at 02/04/15 1007 Last data filed at 02/04/15 0800  Gross per 24 hour  Intake 2151.83 ml  Output    395 ml  Net 1756.83 ml    PHYSICAL EXAMINATION: General:  Chronically ill appearing female Neuro:   follows commands , more alert, appears stronger power 3/5 HEENT:  MM dry Cardiovascular:  s1s2 tachy  Lungs:  Clear bilaterally   Abdomen:  round, mildly distended, soft, BSx4 active   Musculoskeletal:  Warm and dry, no edema  LABS:  CBC  Recent Labs Lab 02/02/15 0245 02/03/15 0500 02/04/15 0500  WBC 14.2* 8.8 7.1  HGB 8.5* 8.5* 7.9*  HCT 27.9* 27.0* 26.1*  PLT 389 377 371   Coag's  Recent Labs Lab 01/29/15 0506 01/30/15 0551 02/02/15 0245  APTT >200* 136* 46*  INR  --   --  1.29    BMET  Recent Labs Lab 02/02/15 0245 02/03/15 0500 02/04/15 0435  NA 143 143 138  K 3.7 3.7 3.8  CL 107 110 100  CO2 22 23 23   BUN 50* 69* 31*  CREATININE 6.01* 7.47* 4.12*  GLUCOSE 118* 128* 154*   Electrolytes  Recent Labs Lab 02/02/15 0245  02/03/15 0500 02/03/15  1105 02/03/15 2305 02/04/15 0435  CALCIUM 9.8  --  9.8  --   --  9.5  MG  --   < >  --  1.9 2.0 2.0  PHOS 4.8*  < > 5.6* <1.0* 3.4 3.4  3.4  < > = values in this interval not displayed. Sepsis Markers No results for input(s): LATICACIDVEN, PROCALCITON, O2SATVEN in the last 168 hours.   ABG  Recent Labs Lab 01/31/15 1429 01/31/15 1700 01/31/15 1725  PHART 7.383 7.303* 7.381  PCO2ART 34.3* 45.8* 36.4  PO2ART 52.0* 18.0* 208.0*   Liver Enzymes  Recent Labs Lab 01/29/15 1520  02/02/15 0245 02/03/15 0500 02/04/15 0435  AST 49*  --   --   --   --   ALT 18  --   --   --   --   ALKPHOS 108  --   --   --   --   BILITOT 0.8  --   --   --   --   ALBUMIN  2.6*  2.7*  < > 2.2* 2.0* 2.2*  < > = values in this interval not displayed.   Cardiac Enzymes No results for input(s): TROPONINI, PROBNP in the last 168 hours. Glucose  Recent Labs Lab 02/03/15 1213 02/03/15 1607 02/03/15 2027 02/03/15 2335 02/04/15 0355 02/04/15 0808  GLUCAP 163* 133* 135* 145* 137* 135*    Imaging Dg Chest Port 1 View  02/03/2015   CLINICAL DATA:  Acute respiratory failureHx of collagen vascular disease, DM, HTN, stenosis of cervical spine region, chronic ankle pain, anemia  EXAM: PORTABLE CHEST - 1 VIEW  COMPARISON:  02/02/2015  FINDINGS: Residual opacity in the left lung base is similar to the previous day's study, likely atelectasis. Remainder of the lungs is clear. No pneumothorax. Possible minimal left effusion.  Bilateral internal jugular central venous lines, endotracheal tube and enteric tube are stable. Cardiac silhouette normal in size. No mediastinal hilar masses.  IMPRESSION: 1. No change from the previous day's study. 2. Mild residual left lung base opacity most likely atelectasis. Possible small left effusion. No convincing pneumonia or pulmonary edema. 3. Support apparatus is stable and well positioned.   Electronically Signed   By: Lajean Manes M.D.   On: 02/03/2015 09:19    ASSESSMENT / PLAN:  PULMONARY Acute hypoxic respiratory failure - unknown etiology but in setting B infiltrates.  -treated for HCAP, PE (d/c'd heparin 3/23). Consider acute pulmonary edema in setting HTN and renal failure-  infiltrates have completely cleared. Most likely cause is cardiogenic edema.  LLL atx s/p bronch 3/30 -resolved ETT 3/20 >> 3/29 reintubated 3/30 >>  P:   Daily WUA / SBT  Perc trach deferred to next week , wonder if we should try another trial of extubation if she has consistent neuro improvement    CARDIOVASCULAR SVT/sinus tachycardia - HR was 150 at time of her acute decompensation, Adenosine given, appeared to be sinus with some non-conducted  p-waves. Presumed reactive to critical illness Hypotension Mildly elevated troponin peak 0.74 dCHF - new.  EF 40-45% with MOD aortic, tricuspid and mitral regurg.  ??PE - BLE venous dopplers prelim negative; doubt PE (heparin gtt stopped 3/23) P:  Dc Hydralazine PRN  Ct  labetalol 200 mg tID (reduced home dose) -via NGT Hold home clonidine Use lopressor prn for HR 130 & above  RENAL Acute on chronic renal failure, presume lupus nephritis.  Hyponatremia  Metabolic acidosis P:   Appreciate renal service's assistance Off CRRT, Tolerating  iHD - she is not expected to recover her renal fxn,  permacath -  planned for Monday  GASTROINTESTINAL Diarrhea - initial resolved.  New with reglan 3/26, flexi seal placed.  Abd pain - neg abd xray  High Gastric Residuals Protein Calorie Malnutrition  P:   ct TF  dc'd reglan  Flexi-seal 3/26 Pepcid    HEMATOLOGIC Leukocytosis  Anemia - s/p 1 unit PRBC 3/22 P:  Cont iron    INFECTIOUS Fever - resolved  Immunocompromised Host Diarrhea - resolved Unclear etiology of infection, consider PNA in immunocompromised host but CXR has quickly cleared.  ??GI given diarrhea v viral v other source (ie lupus pneumonitis).  Immunocompromised on rx for lupus.  P:   BCx2 3/20 >>>neg UC 3/20 >>> negative BAL 3/30 >> candida  Vanc 3/20 (empiric) >>> 3/22, 3/30 >>4/2 Zosyn 3/20 (empiric) >>>3/28 Cefepime 3/30 >> (LLL atx) 4/3 planned  OK to resume cellcept & plaquenil   ENDOCRINE No active issue    P:   Monitor glucose   NEUROLOGIC Acute encephalopathy -fluctuating- Head Ct neg 7/85 EEG 8/85 metabolic enceph MRI 0/27 >> Remote RIGHT inferior brainstem hemorrhage, stable RIGHT basal ganglia micro hemorrhage, Remote small RIGHT inferior cerebellar infarct. CSF 3/31 neg P:   RASS goal: 0  Fent gtt    FAMILY  - Updates: Mother & sister updated daily  Summary -Fluctuating mental status main issue here - doubt lupus cerebritis given bland  CSF, Plan for trach next week unless consistent neurologic improvement  The patient is critically ill with multiple organ systems failure and requires high complexity decision making for assessment and support, frequent evaluation and titration of therapies, application of advanced monitoring technologies and extensive interpretation of multiple databases. Critical Care Time devoted to patient care services described in this note independent of APP time is 35 minutes.    Kara Mead MD. [place]. [facility] Pager [phone] If no response call [phone]    02/04/2015, 10:07 AM

## 2015-02-04 NOTE — Progress Notes (Addendum)
[facility] ROUNDING NOTE   Subjective:   Remains on vent Much more awake, alert, responds to questions, follows commands - best I have seen her Transitioned to HD - had treatment yesterday - only issue was some hypotension. To get [facility] probably on Monday   Objective:  Vital signs in last 24 hours:  Temp:  [97.9 F (36.6 C)-99.7 F (37.6 C)] 99.3 F (37.4 C) (04/03 0806) Pulse Rate:  [80-130] 107 (04/03 0756) Resp:  [12-30] 21 (04/03 0756) BP: (84-121)/(50-67) 108/58 mmHg (04/03 0756) SpO2:  [100 %] 100 % (04/03 0756) FiO2 (%):  [40 %] 40 % (04/03 0756) Weight:  [51.4 kg (113 lb 5.1 oz)-53.2 kg (117 lb 4.6 oz)] 51.4 kg (113 lb 5.1 oz) (04/03 0200)  Weight change: -0.5 kg (-1 lb 1.6 oz) Filed Weights   02/03/15 0700 02/03/15 1053 02/04/15 0200  Weight: 53.7 kg (118 lb 6.2 oz) 53.2 kg (117 lb 4.6 oz) 51.4 kg (113 lb 5.1 oz)   Physical Examination BP 108/58 mmHg  Pulse 107  Temp(Src) 99.3 F (37.4 C) (Oral)  Resp 21  Wt 51.4 kg (113 lb 5.1 oz)  SpO2 100%  LMP 07/18/2012 Following commands, smiles a little, nods yes and no appropriately. R IJ dialysis line dressing intact. Left IJ central line Lungs clear laterally and anteriorly S1S2 No S3 HR 120's Abd soft, not tender SCD's in place and no edema LE's   Weight trending: (max weight this hospitalization around 60 kg) 3/28 52.6 3/29 52.4 3/30 50.5 (?) 3/31 51.2 kg 4/01 51.9 kg 4/02 53.7 pre HD 4/02 53.2 post HD    Recent Labs Lab 01/31/15 0535 02/01/15 0445 02/02/15 0245 02/02/15 1105 02/03/15 0001 02/03/15 0500 02/03/15 1105 02/03/15 2305 02/04/15 0435  NA 141 142 143  --   --  143  --   --  138  K 4.1 3.7 3.7  --   --  3.7  --   --  3.8  CL 105 105 107  --   --  110  --   --  100  CO2 25 23 22   --   --  23  --   --  23  GLUCOSE 135* 141* 118*  --   --  128*  --   --  154*  BUN 29* 31* 50*  --   --  69*  --   --  31*  CREATININE 3.31* 4.23* 6.01*  --   --  7.47*  --   --  4.12*   CALCIUM 10.9* 10.1 9.8  --   --  9.8  --   --  9.5  MG  --   --   --  2.5 2.7*  --  1.9 2.0 2.0  PHOS 5.4* 4.9* 4.8* 4.7* 5.5* 5.6* <1.0* 3.4 3.4  3.4     Recent Labs Lab 01/29/15 1520  01/31/15 0535 02/01/15 0445 02/02/15 0245 02/03/15 0500 02/04/15 0435  AST 49*  --   --   --   --   --   --   ALT 18  --   --   --   --   --   --   ALKPHOS 108  --   --   --   --   --   --   BILITOT 0.8  --   --   --   --   --   --   PROT 7.0  --   --   --   --   --   --  ALBUMIN 2.6*  2.7*  < > 2.9* 2.4* 2.2* 2.0* 2.2*  < > = values in this interval not displayed. No results for input(s): LIPASE, AMYLASE in the last 168 hours.  Recent Labs Lab 01/28/15 0950  AMMONIA 21    CBC:  Recent Labs Lab 01/31/15 0535 02/01/15 0445 02/02/15 0245 02/03/15 0500 02/04/15 0500  WBC 14.7* 21.1* 14.2* 8.8 7.1  HGB 10.9* 9.5* 8.5* 8.5* 7.9*  HCT 35.9* 30.9* 27.9* 27.0* 26.1*  MCV 91.8 91.4 90.9 91.2 90.9  PLT 458* 447* 389 377 371    CBG:  Recent Labs Lab 02/03/15 1213 02/03/15 1607 02/03/15 2027 02/03/15 2335 02/04/15 0355  GLUCAP 163* 133* 135* 145* 137*    Imaging: Dg Chest Port 1 View  02/03/2015   CLINICAL DATA:  Acute respiratory failureHx of collagen vascular disease, DM, HTN, stenosis of cervical spine region, chronic ankle pain, anemia  EXAM: PORTABLE CHEST - 1 VIEW  COMPARISON:  02/02/2015  FINDINGS: Residual opacity in the left lung base is similar to the previous day's study, likely atelectasis. Remainder of the lungs is clear. No pneumothorax. Possible minimal left effusion.  Bilateral internal jugular central venous lines, endotracheal tube and enteric tube are stable. Cardiac silhouette normal in size. No mediastinal hilar masses.  IMPRESSION: 1. No change from the previous day's study. 2. Mild residual left lung base opacity most likely atelectasis. Possible small left effusion. No convincing pneumonia or pulmonary edema. 3. Support apparatus is stable and well positioned.    Electronically Signed   By: Lajean Manes M.D.   On: 02/03/2015 09:19     Medications:   . dextrose 5 % and 0.9% NaCl 10 mL/hr at 02/03/15 2000  . feeding supplement (VITAL AF 1.2 CAL) 1,000 mL (02/03/15 2021)  . fentaNYL infusion INTRAVENOUS 75 mcg/hr (02/04/15 0106)   . antiseptic oral rinse  7 mL Mouth Rinse QID  . chlorhexidine  15 mL Mouth Rinse BID  . darbepoetin (ARANESP) injection - NON-DIALYSIS  60 mcg Subcutaneous Q Fri-1800  . famotidine (PEPCID) IV  20 mg Intravenous Q24H  . folic acid  1 mg Oral Daily  . heparin subcutaneous  5,000 Units Subcutaneous 3 times per day  . hydroxychloroquine  400 mg Oral Daily  . labetalol  200 mg Per Tube 3 times per day  . multivitamin  1 tablet Oral QHS  . mycophenolate (CELLCEPT) IV  1,000 mg Intravenous Q12H  . thiamine  100 mg Oral Daily   acetaminophen, fentaNYL, hydrocortisone-pramoxine, labetalol, metoprolol, ondansetron **OR** ondansetron (ZOFRAN) IV  BACKGROUND  54 yo AAF with SLE, CKD4 (creatinine 3.5 at [facility] on 12/26/14 and pt being teed up for eventual dialysis), pontine stroke 09/2014 d/t malignant HTN, admitted with cough, SOB, pulm infiltrates, diarrhea, progressive resp failure requiring intubation, sustained tachycardia, and no unifying dx as of yet (working dx infection in immunocompromised pt vs PE vs ?) who developed AKI on advaned CKD4 and oligoanuria, with worsening acidosis, requiring initiation of CRRT 01/23/15   Assessment/ Plan:  1. AKI on CKD5/new ESRD - CRRT 3/22-3/29  (temp cath placed 3/21)  Transitioned to HD on 4/2.  To have [place] placed probably Monday. Permanent access when more stable. Do not expect any recovery of kidney function. Next HD probably Tuesday. [facility] aware of pt and vein mapping also done. 2. Anemia - s/p transfusion 01/23/15. Fe studies normal on 01/05/15. Received darbe 150 on 3/25. Was not redosed 82 on Friday as ordered. With steady decline in Hb change dose  back to 150 and dose with next HD.   3. Secondary hyperpara - last PTH  > 1000 (1171) on 01/23/15 Phos OK. When has tube back in or able to swallow will need sensipar (d/t corrected hypercalcemia) 4. Recurrent resp failure - Reintubated 3/30. Off ATB's now. Partial clearing of LLL process and otherwise clear. ? Trach early in the week. Per CCM 5. Sustained tachycardia - on scheduled labetolol plus prn metoprolol - improved. ECHO EF 40-45% with mod AI, MR, TR 6. HTN - See  #5 7. Diarrhea - rectal tube in place. Black stool.Prev studies neg for CDiff 8. SLE - Back on cellcept and plaquenil. 9. AMS -  MRI nothing acute (remote RIGHT inferior brainstem hemorrhage, stable RIGHT basalganglia micro hemorrhage, remote small right inferior cerebellar infarct.old hemorrhages) and EEG just diffuse slowing. LP looked unremarkable. MUCH improved this morning.  10. Malnutrition - tube feeds   Jamal Maes, MD [place] Pager 02/04/2015, 8:29 AM

## 2015-02-04 NOTE — Progress Notes (Addendum)
VASCULAR LAB PRELIMINARY  PRELIMINARY  PRELIMINARY  PRELIMINARY  Right  Upper Extremity Vein Map    Cephalic  Segment Diameter Depth Comment  1. Axilla 2.5 mm mm   2. Mid upper arm 2.8 mm mm   3. Above AC 2.4 mm mm Branch   4. In AC 2.5 mm mm Branch   5. Below AC 2.4 mm mm   6. Mid forearm 2.1 mm mm   7. Wrist 2.3 mm mm    mm mm    mm mm    mm mm    Basilic  Segment Diameter Depth Comment  1. Axilla 2.9 mm 10.3 mm   2. Mid upper arm 3.2 mm 11.7 mm   3. Above AC 3.8 mm 9.1 mm   4. In AC 2.7 mm 5 mm Branch   5. Below AC 2.2 mm 3.5 mm Branch    mm mm    mm mm    mm mm    mm mm    mm mm     Left Upper Extremity Vein Map    Cephalic  Segment Diameter Depth Comment  1. Axilla 1.3 mm mm   2. Mid upper arm 1.5 mm mm Non compressible  3. Above AC 1.4 mm mm Non compressible  4. In AC 1.3 mm mm   5. Below AC 1.9 mm mm Branch   6. Mid forearm 2.1 mm mm   7. Wrist 1.9 mm mm    mm mm    mm mm    mm mm    Basilic  Segment Diameter Depth Comment  1. Axilla 1.8 mm 17.2 mm   2. Mid upper arm 2.2 mm 12.6 mm   3. Above AC 2.6 mm 8.9 mm   4. In AC 2.8 mm 7.2 mm Branch   5. Below AC 2 mm 2.6 mm Branch    mm mm    mm mm    mm mm    mm mm    mm mm     Borden Thune, RVT 02/04/2015, 11:11 AM

## 2015-02-05 ENCOUNTER — Inpatient Hospital Stay (HOSPITAL_COMMUNITY): Payer: Managed Care, Other (non HMO) | Admitting: Critical Care Medicine

## 2015-02-05 ENCOUNTER — Inpatient Hospital Stay (HOSPITAL_COMMUNITY): Payer: Managed Care, Other (non HMO)

## 2015-02-05 ENCOUNTER — Encounter (HOSPITAL_COMMUNITY): Payer: Managed Care, Other (non HMO)

## 2015-02-05 ENCOUNTER — Encounter (HOSPITAL_COMMUNITY)
Admission: EM | Disposition: A | Discharge status: Rehabilitation Facility | Payer: Self-pay | Source: Home / Self Care | Attending: Internal Medicine

## 2015-02-05 ENCOUNTER — Encounter (HOSPITAL_COMMUNITY): Payer: Self-pay | Admitting: Critical Care Medicine

## 2015-02-05 HISTORY — PX: INSERTION OF DIALYSIS CATHETER: SHX1324

## 2015-02-05 LAB — RENAL FUNCTION PANEL
Albumin: 2 g/dL — ABNORMAL LOW (ref 3.5–5.2)
Anion gap: 12 (ref 5–15)
BUN: 43 mg/dL — AB (ref 6–23)
CALCIUM: 9.4 mg/dL (ref 8.4–10.5)
CO2: 23 mmol/L (ref 19–32)
Chloride: 103 mmol/L (ref 96–112)
Creatinine, Ser: 5.38 mg/dL — ABNORMAL HIGH (ref 0.50–1.10)
GFR calc Af Amer: 10 mL/min — ABNORMAL LOW (ref >90)
GFR calc non Af Amer: 8 mL/min — ABNORMAL LOW (ref >90)
GLUCOSE: 149 mg/dL — AB (ref 70–99)
PHOSPHORUS: 3.3 mg/dL (ref 2.3–4.6)
Potassium: 3.8 mmol/L (ref 3.5–5.1)
Sodium: 138 mmol/L (ref 135–145)

## 2015-02-05 LAB — CSF CULTURE: Culture: NO GROWTH

## 2015-02-05 LAB — CBC
HEMATOCRIT: 24 % — AB (ref 36.0–46.0)
Hemoglobin: 7.5 g/dL — ABNORMAL LOW (ref 12.0–15.0)
MCH: 28.1 pg (ref 26.0–34.0)
MCHC: 31.3 g/dL (ref 30.0–36.0)
MCV: 89.9 fL (ref 78.0–100.0)
PLATELETS: 353 10*3/uL (ref 150–400)
RBC: 2.67 MIL/uL — ABNORMAL LOW (ref 3.87–5.11)
RDW: 17.2 % — AB (ref 11.5–15.5)
WBC: 6.8 10*3/uL (ref 4.0–10.5)

## 2015-02-05 LAB — GLUCOSE, CAPILLARY
Glucose-Capillary: 100 mg/dL — ABNORMAL HIGH (ref 70–99)
Glucose-Capillary: 119 mg/dL — ABNORMAL HIGH (ref 70–99)
Glucose-Capillary: 120 mg/dL — ABNORMAL HIGH (ref 70–99)
Glucose-Capillary: 120 mg/dL — ABNORMAL HIGH (ref 70–99)
Glucose-Capillary: 141 mg/dL — ABNORMAL HIGH (ref 70–99)
Glucose-Capillary: 143 mg/dL — ABNORMAL HIGH (ref 70–99)

## 2015-02-05 LAB — MAGNESIUM: Magnesium: 2 mg/dL (ref 1.5–2.5)

## 2015-02-05 LAB — CSF IGG: IGG CSF: 2.6 mg/dL (ref 0.0–8.6)

## 2015-02-05 LAB — CSF CULTURE W GRAM STAIN

## 2015-02-05 LAB — PHOSPHORUS: Phosphorus: 3.3 mg/dL (ref 2.3–4.6)

## 2015-02-05 SURGERY — INSERTION OF DIALYSIS CATHETER
Anesthesia: General | Laterality: Right

## 2015-02-05 MED ORDER — VITAL AF 1.2 CAL PO LIQD
1000.0000 mL | ORAL | Status: DC
Start: 2015-02-05 — End: 2015-02-07
  Administered 2015-02-06: 1000 mL
  Filled 2015-02-05 (×4): qty 1000

## 2015-02-05 MED ORDER — LIDOCAINE-EPINEPHRINE (PF) 1 %-1:200000 IJ SOLN
INTRAMUSCULAR | Status: DC | PRN
  Administered 2015-02-05: 30 mL

## 2015-02-05 MED ORDER — LIDOCAINE-EPINEPHRINE (PF) 1 %-1:200000 IJ SOLN
INTRAMUSCULAR | Status: AC
  Filled 2015-02-05: qty 10

## 2015-02-05 MED ORDER — FENTANYL CITRATE 0.05 MG/ML IJ SOLN
INTRAMUSCULAR | Status: AC
  Filled 2015-02-05: qty 5

## 2015-02-05 MED ORDER — 0.9 % SODIUM CHLORIDE (POUR BTL) OPTIME
TOPICAL | Status: DC | PRN
  Administered 2015-02-05: 1000 mL

## 2015-02-05 MED ORDER — PHENYLEPHRINE HCL 10 MG/ML IJ SOLN
INTRAMUSCULAR | Status: DC | PRN
  Administered 2015-02-05: 80 ug via INTRAVENOUS
  Administered 2015-02-05: 120 ug via INTRAVENOUS
  Administered 2015-02-05 (×2): 80 ug via INTRAVENOUS

## 2015-02-05 MED ORDER — FENTANYL CITRATE 0.05 MG/ML IJ SOLN
INTRAMUSCULAR | Status: DC | PRN
  Administered 2015-02-05 (×2): 50 ug via INTRAVENOUS

## 2015-02-05 MED ORDER — MIDAZOLAM HCL 2 MG/2ML IJ SOLN
INTRAMUSCULAR | Status: AC
Start: 2015-02-05 — End: 2015-02-05
  Filled 2015-02-05: qty 2

## 2015-02-05 MED ORDER — HEPARIN SODIUM (PORCINE) 1000 UNIT/ML IJ SOLN
INTRAMUSCULAR | Status: DC | PRN
  Administered 2015-02-05: 1000 [IU]

## 2015-02-05 MED ORDER — SODIUM CHLORIDE 0.9 % IR SOLN
Status: DC | PRN
  Administered 2015-02-05: 10:00:00

## 2015-02-05 MED ORDER — CEFAZOLIN SODIUM-DEXTROSE 2-3 GM-% IV SOLR
INTRAVENOUS | Status: DC | PRN
  Administered 2015-02-05: 2 g via INTRAVENOUS

## 2015-02-05 MED ORDER — PANTOPRAZOLE SODIUM 40 MG PO PACK
40.0000 mg | PACK | Freq: Every day | ORAL | Status: DC
  Administered 2015-02-05 – 2015-02-14 (×10): 40 mg
  Filled 2015-02-05 (×12): qty 20

## 2015-02-05 MED ORDER — HEPARIN SODIUM (PORCINE) 1000 UNIT/ML IJ SOLN
INTRAMUSCULAR | Status: AC
  Filled 2015-02-05: qty 1

## 2015-02-05 MED ORDER — SODIUM CHLORIDE 0.9 % IV SOLN
INTRAVENOUS | Status: DC | PRN
  Administered 2015-02-05: 10:00:00 via INTRAVENOUS

## 2015-02-05 MED ORDER — PANTOPRAZOLE SODIUM 40 MG PO TBEC: 40.0000 mg | DELAYED_RELEASE_TABLET | Freq: Every day | ORAL | Status: DC

## 2015-02-05 MED ORDER — MIDAZOLAM HCL 2 MG/2ML IJ SOLN
INTRAMUSCULAR | Status: DC | PRN
  Administered 2015-02-05: 2 mg via INTRAVENOUS

## 2015-02-05 MED ORDER — MIDAZOLAM HCL 2 MG/2ML IJ SOLN
INTRAMUSCULAR | Status: AC
  Filled 2015-02-05: qty 2

## 2015-02-05 SURGICAL SUPPLY — 41 items
BAG DECANTER FOR FLEXI CONT (MISCELLANEOUS) ×3 IMPLANT
BIOPATCH RED 1 DISK 7.0 (GAUZE/BANDAGES/DRESSINGS) ×2 IMPLANT
BIOPATCH RED 1IN DISK 7.0MM (GAUZE/BANDAGES/DRESSINGS) ×1
CATH CANNON HEMO 15FR 23CM (HEMODIALYSIS SUPPLIES) ×2 IMPLANT
COVER PROBE W GEL 5X96 (DRAPES) ×3 IMPLANT
COVER SURGICAL LIGHT HANDLE (MISCELLANEOUS) ×2 IMPLANT
DRAPE C-ARM 42X72 X-RAY (DRAPES) ×3 IMPLANT
DRAPE CHEST BREAST 15X10 FENES (DRAPES) ×3 IMPLANT
GAUZE SPONGE 2X2 8PLY STRL LF (GAUZE/BANDAGES/DRESSINGS) ×1 IMPLANT
GAUZE SPONGE 4X4 16PLY XRAY LF (GAUZE/BANDAGES/DRESSINGS) ×3 IMPLANT
GLOVE BIO SURGEON STRL SZ7 (GLOVE) ×3 IMPLANT
GLOVE BIOGEL PI IND STRL 6.5 (GLOVE) IMPLANT
GLOVE BIOGEL PI IND STRL 7.0 (GLOVE) IMPLANT
GLOVE BIOGEL PI IND STRL 7.5 (GLOVE) ×1 IMPLANT
GLOVE BIOGEL PI INDICATOR 6.5 (GLOVE) ×2
GLOVE BIOGEL PI INDICATOR 7.0 (GLOVE) ×2
GLOVE BIOGEL PI INDICATOR 7.5 (GLOVE) ×2
GLOVE ECLIPSE 6.5 STRL STRAW (GLOVE) ×2 IMPLANT
GOWN STRL REUS W/ TWL LRG LVL3 (GOWN DISPOSABLE) ×2 IMPLANT
GOWN STRL REUS W/TWL LRG LVL3 (GOWN DISPOSABLE) ×6
KIT BASIN OR (CUSTOM PROCEDURE TRAY) ×3 IMPLANT
KIT ROOM TURNOVER OR (KITS) ×3 IMPLANT
LIQUID BAND (GAUZE/BANDAGES/DRESSINGS) ×2 IMPLANT
NDL 18GX1X1/2 (RX/OR ONLY) (NEEDLE) ×1 IMPLANT
NDL HYPO 25GX1X1/2 BEV (NEEDLE) ×1 IMPLANT
NEEDLE 18GX1X1/2 (RX/OR ONLY) (NEEDLE) ×3 IMPLANT
NEEDLE HYPO 25GX1X1/2 BEV (NEEDLE) ×3 IMPLANT
NS IRRIG 1000ML POUR BTL (IV SOLUTION) ×3 IMPLANT
PACK SURGICAL SETUP 50X90 (CUSTOM PROCEDURE TRAY) ×3 IMPLANT
PAD ARMBOARD 7.5X6 YLW CONV (MISCELLANEOUS) ×6 IMPLANT
SOAP 2 % CHG 4 OZ (WOUND CARE) ×3 IMPLANT
SPONGE GAUZE 2X2 STER 10/PKG (GAUZE/BANDAGES/DRESSINGS) ×2
SUT ETHILON 3 0 PS 1 (SUTURE) ×3 IMPLANT
SUT MNCRL AB 4-0 PS2 18 (SUTURE) ×3 IMPLANT
SYR 20CC LL (SYRINGE) ×6 IMPLANT
SYR 3ML LL SCALE MARK (SYRINGE) ×3 IMPLANT
SYR 5ML LL (SYRINGE) ×3 IMPLANT
SYR CONTROL 10ML LL (SYRINGE) ×3 IMPLANT
SYRINGE 10CC LL (SYRINGE) ×3 IMPLANT
TAPE CLOTH SURG 4X10 WHT LF (GAUZE/BANDAGES/DRESSINGS) ×2 IMPLANT
WATER STERILE IRR 1000ML POUR (IV SOLUTION) ×3 IMPLANT

## 2015-02-05 NOTE — Anesthesia Preprocedure Evaluation (Addendum)
Anesthesia Evaluation  Patient identified by MRN, date of birth, ID band  Reviewed: Unable to perform ROS - Chart review only  Airway        Dental   Pulmonary pneumonia -, former smoker,  Pt intubated         Cardiovascular hypertension, Pt. on medications and Pt. on home beta blockers     Neuro/Psych PSYCHIATRIC DISORDERS    GI/Hepatic   Endo/Other  diabetes  Renal/GU Renal Insufficiency and CRFRenal disease     Musculoskeletal  (+) Arthritis -, Rheumatoid disorders,    Abdominal   Peds  Hematology  (+) anemia ,   Anesthesia Other Findings   Reproductive/Obstetrics                            Anesthesia Physical Anesthesia Plan  ASA: III  Anesthesia Plan: General   Post-op Pain Management:    Induction: Inhalational  Airway Management Planned:   Additional Equipment:   Intra-op Plan:   Post-operative Plan: Post-operative intubation/ventilation  Informed Consent: I have reviewed the patients History and Physical, chart, labs and discussed the procedure including the risks, benefits and alternatives for the proposed anesthesia with the patient or authorized representative who has indicated his/her understanding and acceptance.     Plan Discussed with: Anesthesiologist and Surgeon  Anesthesia Plan Comments:         Anesthesia Quick Evaluation

## 2015-02-05 NOTE — Anesthesia Postprocedure Evaluation (Signed)
  Anesthesia Post-op Note  Patient: [name] [name]  Procedure(s) Performed: Procedure(s): INSERTION OF DIALYSIS CATHETER - RIGHT INTERNAL JUGULAR (Right)  Patient Location: ICU  Anesthesia Type:General  Level of Consciousness: sedated  Airway and Oxygen Therapy: Patient remains intubated per anesthesia plan  Post-op Pain: none  Post-op Assessment: Post-op Vital signs reviewed, Patient's Cardiovascular Status Stable, Respiratory Function Stable, Patent Airway and No signs of Nausea or vomiting  Post-op Vital Signs: Reviewed and stable  Last Vitals:  Filed Vitals:   02/05/15 1253  BP:   Pulse:   Temp:   Resp: 22    Complications: No apparent anesthesia complications

## 2015-02-05 NOTE — Anesthesia Procedure Notes (Signed)
Date/Time: 02/05/2015 9:50 AM Performed by: Merrilyn Puma B Pre-anesthesia Checklist: Patient identified, Timeout performed, Emergency Drugs available, Suction available and Patient being monitored Patient Re-evaluated:Patient Re-evaluated prior to inductionOxygen Delivery Method: Circle system utilized Preoxygenation: Pre-oxygenation with 100% oxygen Intubation Type: Inhalational induction Placement Confirmation: positive ETCO2,  ETT inserted through vocal cords under direct vision and breath sounds checked- equal and bilateral Dental Injury: Teeth and Oropharynx as per pre-operative assessment  Comments: Pt already intubated.

## 2015-02-05 NOTE — Progress Notes (Signed)
UR Completed.  [phone]  

## 2015-02-05 NOTE — Progress Notes (Signed)
S: Denies pain O:BP 117/59 mmHg  Pulse 94  Temp(Src) 98.9 F (37.2 C) (Oral)  Resp 16  Wt 50.8 kg (111 lb 15.9 oz)  SpO2 100%  LMP 07/18/2012  Intake/Output Summary (Last 24 hours) at 02/05/15 0754 Last data filed at 02/05/15 0600  Gross per 24 hour  Intake 1572.5 ml  Output    650 ml  Net  922.5 ml   Weight change: -2.4 kg (-5 lb 4.7 oz) Gen: Intubated but awake CVS: RRR Resp: Clear ant Abd: + BS NTND No HSM Ext: No edema NEURO: Awake and alert.  Able to nod appropriately to ? Rt IJ Temp HD cath.  Lt IJ triple lumen   . antiseptic oral rinse  7 mL Mouth Rinse QID  . chlorhexidine  15 mL Mouth Rinse BID  . [START ON 02/06/2015] darbepoetin (ARANESP) injection - DIALYSIS  150 mcg Intravenous Q Tue-HD  . famotidine (PEPCID) IV  20 mg Intravenous Q24H  . folic acid  1 mg Oral Daily  . heparin subcutaneous  5,000 Units Subcutaneous 3 times per day  . hydroxychloroquine  400 mg Oral Daily  . labetalol  200 mg Per Tube 3 times per day  . multivitamin  1 tablet Oral QHS  . mycophenolate (CELLCEPT) IV  1,000 mg Intravenous Q12H  . thiamine  100 mg Oral Daily   Dg Chest Port 1 View  02/05/2015   CLINICAL DATA:  Acute respiratory failure, subsequent encounter.  EXAM: PORTABLE CHEST - 1 VIEW  COMPARISON:  02/04/2015.  FINDINGS: Endotracheal tube terminates approximately 5.3 cm above the carina. Feeding tube is followed into the stomach. Right IJ and left IJ central line tips project over the upper SVC. Heart size stable. Continued regression of left perihilar airspace opacification. Linear opacities are again seen in the left lower lobe. Lungs are otherwise clear. No pleural fluid.  IMPRESSION: Continued improvement in left perihilar airspace opacification. Probable atelectasis in the left lower lobe, stable.   Electronically Signed   By: Lorin Picket M.D.   On: 02/05/2015 07:21   Dg Chest Port 1 View  02/04/2015   CLINICAL DATA:  Acute respiratory failure. Lupus. Chronic kidney  disease. On ventilator.  EXAM: PORTABLE CHEST - 1 VIEW  COMPARISON:  02/03/2015  FINDINGS: Support lines and tubes in appropriate position. No pneumothorax identified.  Decreased airspace disease is seen in the left upper lobe since previous study. There is persistent airspace disease in the retrocardiac left lower lobe. Right lung remains clear. Heart size is within normal limits. No definite pleural effusion seen.  IMPRESSION: Interval improvement in left upper lobe airspace disease.  No significant change in retrocardiac left lower lobe airspace disease.   Electronically Signed   By: Earle Gell M.D.   On: 02/04/2015 09:54   BMET    Component Value Date/Time   NA 138 02/05/2015 0230   K 3.8 02/05/2015 0230   CL 103 02/05/2015 0230   CO2 23 02/05/2015 0230   GLUCOSE 149* 02/05/2015 0230   BUN 43* 02/05/2015 0230   CREATININE 5.38* 02/05/2015 0230   CREATININE 3.49* 11/10/2014 1822   CREATININE 0.35* 10/23/2008 2300   CALCIUM 9.4 02/05/2015 0230   CALCIUM 8.9 10/10/2014 1931   GFRNONAA 8* 02/05/2015 0230   GFRAA 10* 02/05/2015 0230   CBC    Component Value Date/Time   WBC 6.8 02/05/2015 0230   WBC 5.5 03/01/2012 1128   RBC 2.67* 02/05/2015 0230   RBC 3.56* 03/01/2012 1128   HGB  7.5* 02/05/2015 0230   HGB 9.0* 03/01/2012 1128   HCT 24.0* 02/05/2015 0230   HCT 29.3* 03/01/2012 1128   PLT 353 02/05/2015 0230   MCV 89.9 02/05/2015 0230   MCV 82.4 03/01/2012 1128   MCH 28.1 02/05/2015 0230   MCH 25.3* 03/01/2012 1128   MCHC 31.3 02/05/2015 0230   MCHC 30.7* 03/01/2012 1128   RDW 17.2* 02/05/2015 0230   LYMPHSABS 0.6* 11/18/2014 2324   MONOABS 0.4 11/18/2014 2324   EOSABS 0.0 11/18/2014 2324   BASOSABS 0.0 11/18/2014 2324     Assessment: 1. Acute on CKD 4/5.  Now off CVVHD.  To get [facility] cath placed today 2. Anemia on aranesp 3. Sec HPTH, Start sensipar when able to take PO meds 4. SLE  Plan: 1. Perm Cath today 2. Try to extubate after cath placed 3. Plan HD  tomorrow 4. Recheck labs in AM   Kaion [place] T

## 2015-02-05 NOTE — Progress Notes (Signed)
PULMONARY / CRITICAL CARE MEDICINE   Name: [name] [name] MRN: [id number] DOB: Aug 16, 1961    ADMISSION DATE:  01/18/2015 CONSULTATION DATE:  3/20  REFERRING MD :  Eliseo Squires   CHIEF COMPLAINT:  Tachycardia, resp failure   INITIAL PRESENTATION:  54 y/o female with hx lupus (on cellcept, plaquenil) and associated CKD, DM, HTN initially presented 3/17 with vague c/o SOB, diarrhea, malaise, weakness, cough.  She was admitted by [facility] and r/o for flu.  Was undergoing further w/u for diarrhea, mildly elevated troponin and anemia.  Overnight 3/19 developed intermittent SVT, then persistent tachycardia with HR 150's and worsening SOB and hypoxia requiring NRB.  PCCM consulted 3/20.  She required ETT/MV 3/20. CXR with B infiltrates. Initiated CVVHD on 3/21 pm.   STUDIES:  CXR 3/20>>> neg  2D echo 3/19>>> TD17-61%, grade 1 diastolic dysfunction, mod AR, mod MR, mod TR. No pericardial effusion.  LE doppler US 3/20 >> negative   SIGNIFICANT EVENTS: 3/21  Adenosine challenge >> appears to be sinus rhythm with some non-conducted p-waves 3/23  Tolerated CVVHD, I/O even last 24 h, tachycardia improved, fent + propofol 3/24  High residuals overnight, on max dose fentanyl.  Rx'd with Reglan  3/26  Continues to have agitation with WUA / SBT, changed back to propofol/fentanyl  3/28 seroquel trance, neuro consult 3/29 more awake, follows commands 3/30 reintubated, bronchoscopy - for LLL atx 4/3 fentanyl drip stopped 4/4: Got tunneled cath today with no complications  SUBJECTIVE: afebrile - mildly tachy (to 112, but in 90s this am), BP stable with MAP>60 -no obvious pain Following commands  VITAL SIGNS: Temp:  [98 F (36.7 C)-99.5 F (37.5 C)] 98.9 F (37.2 C) (04/04 0351) Pulse Rate:  [90-117] 94 (04/04 0246) Resp:  [16-34] 16 (04/04 0621) BP: (103-137)/(50-71) 117/59 mmHg (04/04 0600) SpO2:  [99 %-100 %] 100 % (04/04 0621) FiO2 (%):  [40 %] 40 % (04/04 0500) Weight:  [111 lb 15.9 oz (50.8 kg)] 111  lb 15.9 oz (50.8 kg) (04/04 0500)   VENTILATOR SETTINGS: Vent Mode:  [-] PRVC FiO2 (%):  [40 %] 40 % Set Rate:  [16 bmp] 16 bmp Vt Set:  [480 mL] 480 mL PEEP:  [5 cmH20] 5 cmH20 Pressure Support:  [5 cmH20-10 cmH20] 10 cmH20 Plateau Pressure:  [16 cmH20-17 cmH20] 16 cmH20   INTAKE / OUTPUT:  Intake/Output Summary (Last 24 hours) at 02/05/15 0748 Last data filed at 02/05/15 0600  Gross per 24 hour  Intake 1572.5 ml  Output    650 ml  Net  922.5 ml    PHYSICAL EXAMINATION: General:  Chronically ill appearing female Neuro:   follows commands , strength 3/5  HEENT:  MM dry, right HD cath in place with no bleeding Cardiovascular:  Mildly tachy, S1 and S2 present Lungs:  Clear bilaterally with mechanical breath sounds Abdomen:  round, mildly distended, soft, BSx4 active   Musculoskeletal:  Warm and dry, no edema Neuro: was following commands well this am but more sleepy after anesthesia/post op  LABS:  CBC  Recent Labs Lab 02/03/15 0500 02/04/15 0500 02/05/15 0230  WBC 8.8 7.1 6.8  HGB 8.5* 7.9* 7.5*  HCT 27.0* 26.1* 24.0*  PLT 377 371 353   Coag's  Recent Labs Lab 01/30/15 0551 02/02/15 0245  APTT 136* 46*  INR  --  1.29    BMET  Recent Labs Lab 02/03/15 0500 02/04/15 0435 02/05/15 0230  NA 143 138 138  K 3.7 3.8 3.8  CL 110 100 103  CO2 23 23 23   BUN 69* 31* 43*  CREATININE 7.47* 4.12* 5.38*  GLUCOSE 128* 154* 149*   Electrolytes  Recent Labs Lab 02/03/15 0500  02/03/15 2305 02/04/15 0435 02/05/15 0230  CALCIUM 9.8  --   --  9.5 9.4  MG  --   < > 2.0 2.0 2.0  PHOS 5.6*  < > 3.4 3.4  3.4 3.3  3.3  < > = values in this interval not displayed. Sepsis Markers No results for input(s): LATICACIDVEN, PROCALCITON, O2SATVEN in the last 168 hours.   ABG  Recent Labs Lab 01/31/15 1429 01/31/15 1700 01/31/15 1725  PHART 7.383 7.303* 7.381  PCO2ART 34.3* 45.8* 36.4  PO2ART 52.0* 18.0* 208.0*   Liver Enzymes  Recent Labs Lab  01/29/15 1520  02/03/15 0500 02/04/15 0435 02/05/15 0230  AST 49*  --   --   --   --   ALT 18  --   --   --   --   ALKPHOS 108  --   --   --   --   BILITOT 0.8  --   --   --   --   ALBUMIN 2.6*  2.7*  < > 2.0* 2.2* 2.0*  < > = values in this interval not displayed.   Cardiac Enzymes No results for input(s): TROPONINI, PROBNP in the last 168 hours. Glucose  Recent Labs Lab 02/04/15 0808 02/04/15 1203 02/04/15 1634 02/04/15 1925 02/05/15 0007 02/05/15 0350  GLUCAP 135* 141* 130* 134* 119* 141*    Imaging Dg Chest Port 1 View  02/04/2015   CLINICAL DATA:  Acute respiratory failure. Lupus. Chronic kidney disease. On ventilator.  EXAM: PORTABLE CHEST - 1 VIEW  COMPARISON:  02/03/2015  FINDINGS: Support lines and tubes in appropriate position. No pneumothorax identified.  Decreased airspace disease is seen in the left upper lobe since previous study. There is persistent airspace disease in the retrocardiac left lower lobe. Right lung remains clear. Heart size is within normal limits. No definite pleural effusion seen.  IMPRESSION: Interval improvement in left upper lobe airspace disease.  No significant change in retrocardiac left lower lobe airspace disease.   Electronically Signed   By: Earle Gell M.D.   On: 02/04/2015 09:54    ASSESSMENT / PLAN:  PULMONARY Acute hypoxic respiratory failure - unknown etiology but in setting B infiltrates.  -treated for HCAP, PE (d/c'd heparin 3/23). Consider acute pulmonary edema in setting HTN and renal failure-  infiltrates have completely cleared. Most likely cause is cardiogenic edema.  LLL atx s/p bronch 3/30 -resolved ETT 3/20 >> 3/29 reintubated 3/30 >>  P:   Daily WUA / SBT  Perc trach deferred to this week , will try another trial of extubation if she has consistent neuro improvement    CARDIOVASCULAR SVT/sinus tachycardia - HR was 150 at time of her acute decompensation, Adenosine given, appeared to be sinus with some non-conducted  p-waves. Presumed reactive to critical illness Hypotension improved Mildly elevated troponin peak 0.74 dCHF - new.  EF 40-45% with MOD aortic, tricuspid and mitral regurg.  ??PE - BLE venous dopplers prelim negative; doubt PE (heparin gtt stopped 3/23) P:  Dc Hydralazine PRN  Ct  labetalol 200 mg tID (reduced home dose) -via NGT Hold home clonidine Use lopressor prn for HR 130 & above MAP goal >60  RENAL Acute on chronic renal failure, presume lupus nephritis.  Hyponatremia  Metabolic acidosis P:   Appreciate renal service's assistance Off CRRT, Tolerating iHD -  she is not expected to recover her renal fxn,  Tunneled HD cath planned for today, by Dr. Bridgett Larsson  GASTROINTESTINAL Diarrhea - initial, resolved.  New with reglan 3/26, flexi seal placed.  Abd pain - neg abd xray  High Gastric Residuals Protein Calorie Malnutrition  P:   ct TF  dc'd reglan  Flexi-seal 3/26 Pepcid    HEMATOLOGIC Leukocytosis  Anemia - s/p 1 unit PRBC 3/22,  Hgb with slight gradual drop since then but received Aranesp 4/3 P:  Cont iron    INFECTIOUS Fever - resolved  Immunocompromised Host Diarrhea - resolved Unclear etiology of infection, consider PNA in immunocompromised host but CXR has quickly cleared.  ??GI given diarrhea v viral v other source (ie lupus pneumonitis).  Immunocompromised on rx for lupus.  P:   BCx2 3/20 >>>neg UC 3/20 >>> negative BAL 3/30 >> candida  Vanc 3/20 (empiric) >>> 3/22, 3/30 >>4/2 Zosyn 3/20 (empiric) >>>3/28 Cefepime 3/30 >> (LLL atx) 4/3 planned  cellcept & plaquenil resumed on 4/1  ENDOCRINE No active issue    P:   Monitor glucose   NEUROLOGIC Acute encephalopathy -fluctuating- Head Ct neg 8/52 EEG 7/78 metabolic enceph MRI 2/42 >> Remote RIGHT inferior brainstem hemorrhage, stable RIGHT basal ganglia micro hemorrhage, Remote small RIGHT inferior cerebellar infarct. CSF 3/31 neg P:   RASS goal: 0  Fent gtt d/cd yesterday, showing some neuro  improvement Keep PRN sedation to a minimum   FAMILY  - Updates: Mother & sister updated on 4/3  Summary -Fluctuating mental status has been the main issue but lupus cerebritis unlikely given bland CSF. Had tunneled HD cath today with no complications. May do well with extubation tomorrow. If not, will consider trach this week but may do ok with extubation if neuro status continues to improve.    Blain Pais, MD IMTS, PGY3   02/05/2015, 7:48 AM  PCCM ATTENDING: I have reviewed pt's initial presentation, consultants notes and hospital database in detail.  The above assessment and plan was formulated under my direction.  In summary: Tunneled HD cath placed 4/04 Post procedure, her RAS was 0 to -1 She passed SBT and was extubated On post ext check, her WOB was not excessive She was able to F/C but was largely nonverbal Due to waxing/waning cognition, we need to monitor closely in ICU post extubation If requires re-intubation, she will need trach tube placement   Merton Border, MD;  PCCM service; Mobile [phone]

## 2015-02-05 NOTE — Op Note (Signed)
    OPERATIVE NOTE  PROCEDURE: 1. Right internal jugular vein tunneled dialysis catheter placement 2. Right internal jugular vein cannulation under ultrasound guidance 3. Removal of right temporary dialysis catheter placement  PRE-OPERATIVE DIAGNOSIS: end-stage renal failure  POST-OPERATIVE DIAGNOSIS: same as above  SURGEON: Adele Barthel, MD  ANESTHESIA: general  ESTIMATED BLOOD LOSS: 30 cc  FINDING(S): 1.  Tips of the catheter in the right atrium on fluoroscopy 2.  No obvious pneumothorax on fluoroscopy  SPECIMEN(S):  none  INDICATIONS:   [name] [name] is a 54 y.o. female who presents with end stage renal disease.  The patient presents for tunneled dialysis catheter placement.  The patient is aware the risks of tunneled dialysis catheter placement include but are not limited to: bleeding, infection, central venous injury, pneumothorax, possible venous stenosis, possible malpositioning in the venous system, and possible infections related to long-term catheter presence.  The patient was aware of these risks and agreed to proceed.  DESCRIPTION: After written full informed consent was obtained from the patient, the patient was taken back to the operating room.  Prior to induction, the patient was given IV antibiotics.  After obtaining adequate sedation, the patient was prepped and draped in the standard fashion for a chest or neck tunneled dialysis catheter placement.   The cannulation site, the catheter exit site, and tract for the subcutaneous tunnel were then anesthestized with a total of 7 cc of a 1% Lidocaine with epinepherine.  Under ultrasound guidance, the right internal jugular vein was cannulated with the 18 gauge needle.  A J-wire was then placed down into the inferior vena cava under fluoroscopic guidance.  The wire was then secured in place with a clamp to the drapes.  I then made stab incisions at the neck and exit sites.   I dissected from the exit site to the cannulation  site with a metal tunneler.   The subcutaneous tunnel was dilated by passing a plastic dilator over the metal dissector. The wire was then unclamped and I removed the needle.  The skin tract and venotomy was dilated serially with dilators.  Finally, the dilator-sheath was placed under fluoroscopic guidance into the superior vena cava.  The dilator and wire were removed.  A 23 cm Diatek catheter was placed under fluoroscopic guidance down into the right atrium.  The sheath was broken and peeled away while holding the catheter cuff at the level of the skin.  The back end of this catheter was transected, revealing the two lumens of this catheter.  The ports were docked onto these two lumens.  The catheter hub was then screwed into place.  Each port was tested by aspirating and flushing.  No resistance was noted.  Each port was then thoroughly flushed with heparinized saline.  The catheter was secured in placed with two interrupted stitches of 3-0 Nylon tied to the catheter.  The neck incision was closed with a U-stitch of 4-0 Monocryl.  The neck and chest incision were cleaned and sterile bandages applied.  Each port was then loaded with concentrated heparin (1000 Units/mL) at the manufacturer recommended volumes to each port.  Sterile caps were applied to each port.  On completion fluoroscopy, the tips of the catheter were in the right atrium, and there was no evidence of pneumothorax.  COMPLICATIONS: none  CONDITION: stable   Adele Barthel, MD [facility] [place] Office: [phone] Pager: [phone]  02/05/2015, 10:23 AM

## 2015-02-05 NOTE — Interval H&P Note (Signed)
[facility]  History and Physical Update  The patient was interviewed and re-examined.  The patient's previous History and Physical has been reviewed and is unchanged from Dr. Luther Parody consult.  There is no change in the plan of care: tunneled dialysis catheter placement.  The patient's family is aware the risks of tunneled dialysis catheter placement include but are not limited to: bleeding, infection, central venous injury, pneumothorax, possible venous stenosis, possible malpositioning in the venous system, and possible infections related to long-term catheter presence. The patient's family is aware of these risks and agreed to proceed.   Adele Barthel, MD [facility] [place] Office: [phone] Pager: [phone]  02/05/2015, 7:16 AM

## 2015-02-05 NOTE — H&P (View-Only) (Signed)
Hospital Consult    Reason for Consult:  In need of diatek and permanent HD access Referring Physician:  Lorrene Reid MRN #:  [id number]  Hx obtained from the chart and her sister as she is intubated  History of Present Illness: This is a 54 y.o. female who presented on 01/18/15 with shortness of breath for about a day.  She had cold symptoms of cough and some congestion and sore throat as well as diarrhea.  During her workup in the ER, she did have significant anemia & pt stated she had been taking iron.    She was admitted for blood transfusion.  On 01/20/15, she developed intermittent SVT with persistent tachycardia with HR 150's and worsening SOB and hypoxia requiring NRB.    She did get intubated.  She was also started on CVVHD.  She did get extubated and developed flash pulmonary edema and required re-intubation.   CXR revealed LLL atelectasis and bronchoscopy showed thick secretions suctioned out of the lung.   EEG revealed only significant for slowing.  She had a lumbar puncture yesterday.  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  She is on immunosuppressives.     Sister states the pt is right handed.  States she has not had any surgery in the past.  Current smoker up until admission.   Past Medical History  Diagnosis Date  . Hypertension   . Lupus   . CKD (chronic kidney disease)     due to lupus/Dr. [name]  . Diabetes mellitus   . Stenosis of cervical spine region     with HNP at C5/6, C6/7  . Metatarsal bone fracture right    4th  . 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 Arthritis     RA  . Anemia   . Stroke 11/2014    left sided weakness, dysphagia  . Pain in joints   . Paresthesias   . Hyperlipidemia   . Colitis     ? per hospital notes 11/2014  . Lower GI bleed   . Hyponatremia   . Metabolic acidosis   . Hyperplastic colon polyp   . Hemorrhoids   . Blood transfusion without reported diagnosis     Past  Surgical History  Procedure Laterality Date  . Breast biopsy    . Tubal ligation      Allergies  Allergen Reactions  . Sulfa Antibiotics Itching    Prior to Admission medications   Medication Sig Start Date End Date Taking? Authorizing Provider  acetaminophen (TYLENOL) 325 MG tablet Take 2 tablets (650 mg total) by mouth every 4 (four) hours as needed for mild pain. 10/25/14  Yes Ivan Anchors Love, PA-C  ALPRAZolam (XANAX) 0.5 MG tablet Take 1 tablet (0.5 mg total) by mouth 2 (two) times daily as needed for anxiety. 10/25/14  Yes Ivan Anchors Love, PA-C  gabapentin (NEURONTIN) 100 MG capsule Take 1 capsule (100 mg total) by mouth at bedtime. Patient taking differently: Take 100 mg by mouth 3 (three) times daily.  11/25/14  Yes Orson Eva, MD  labetalol (NORMODYNE) 300 MG tablet Take 2 tablets (600 mg total) by mouth 2 (two) times daily. 11/25/14  Yes Orson Eva, MD  mycophenolate (CELLCEPT) 250 MG capsule Take 4 capsules (1,000 mg total) by mouth 2 (two) times daily. 10/25/14  Yes Ivan Anchors Love, PA-C  senna-docusate (SENOKOT-S) 8.6-50 MG per tablet Take  1 tablet by mouth 2 (two) times daily. Patient taking differently: Take 1 tablet by mouth 2 (two) times daily as needed.  10/25/14  Yes Ivan Anchors Love, PA-C  sodium bicarbonate 650 MG tablet Take 2 tablets (1,300 mg total) by mouth 2 (two) times daily. 10/25/14  Yes Ivan Anchors Love, PA-C  Vitamin D, Ergocalciferol, (DRISDOL) 50000 UNITS CAPS capsule Take 1 capsule (50,000 Units total) by mouth every Wednesday. 10/25/14  Yes Ivan Anchors Love, PA-C  atorvastatin (LIPITOR) 20 MG tablet Take 1 tablet (20 mg total) by mouth daily at 6 PM. 10/25/14   Ivan Anchors Love, PA-C  cloNIDine (CATAPRES) 0.3 MG tablet Take 1 tablet (0.3 mg total) by mouth 3 (three) times daily. 10/25/14   Bary Leriche, PA-C  clotrimazole (MYCELEX) 10 MG troche Take 1 tablet (10 mg total) by mouth 5 (five) times daily. 11/25/14   Orson Eva, MD  cyclobenzaprine (FLEXERIL) 5 MG tablet Take 5 mg by  mouth 3 (three) times daily as needed for muscle spasms.  10/10/14   Historical Provider, MD  diclofenac sodium (VOLTAREN) 1 % GEL Apply 2 g topically 4 (four) times daily. To forefoot 10/25/14   Ivan Anchors Love, PA-C  ferrous sulfate 325 (65 FE) MG tablet Take 1 tablet (325 mg total) by mouth daily with breakfast. 10/25/14   Ivan Anchors Love, PA-C  furosemide (LASIX) 80 MG tablet Take 80 mg by mouth 2 (two) times daily. 10/25/14   Historical Provider, MD  hydrALAZINE (APRESOLINE) 25 MG tablet Take 25 mg by mouth every 8 (eight) hours. 10/25/14   Historical Provider, MD  hydroxychloroquine (PLAQUENIL) 200 MG tablet Take 2 tablets (400 mg total) by mouth daily. 10/25/14   Bary Leriche, PA-C  lidocaine (XYLOCAINE) 2 % solution  11/06/14   Historical Provider, MD  Menthol-Methyl Salicylate (MUSCLE RUB) 10-15 % CREA Apply 1 application topically 3 (three) times daily before meals. 10/25/14   Bary Leriche, PA-C  multivitamin (RENA-VIT) TABS tablet Take 1 tablet by mouth daily.    Historical Provider, MD  nystatin-triamcinolone (MYCOLOG II) cream Apply topically 2 (two) times daily. 11/06/14   Historical Provider, MD  nystatin-triamcinolone ointment (MYCOLOG) Apply 1 application topically 2 (two) times daily. 11/06/14   Mercedes Camprubi-Soms, PA-C  pantoprazole (PROTONIX) 40 MG tablet Take 1 tablet (40 mg total) by mouth daily. 12/07/14   Laban Emperor Zehr, PA-C  pramoxine-hydrocortisone (PROCTOCREAM-HC) 1-1 % rectal cream Place 1 application rectally 2 (two) times daily. Patient taking differently: Place 1 application rectally 2 (two) times daily as needed for hemorrhoids or itching.  12/07/14   Loralie Champagne, PA-C    History   Social History  . Marital Status: Single    Spouse Name: N/A  . Number of Children: N/A  . Years of Education: N/A   Occupational History  . Not on file.   Social History Main Topics  . Smoking status: Former Smoker -- 0.50 packs/day for 30 years    Types: Cigarettes  . Smokeless  tobacco: Never Used  . Alcohol Use: No  . Drug Use: No  . Sexual Activity: Not on file   Other Topics Concern  . Not on file   Social History Narrative     Family History  Problem Relation Age of Onset  . Hypertension    . Lupus    . Rheum arthritis    . Hypertension Mother   . Diabetes Mother   . Hypertension Sister   . Diabetes Father   .  Hypertension Maternal Grandmother     ROS: [x]  Positive   [ ]  Negative   [ ]  All sytems reviewed and are negative  Cardiovascular: []  chest pain/pressure []  palpitations []  SOB lying flat [x]  DOE []  pain in legs while walking []  pain in legs at rest []  pain in legs at night []  non-healing ulcers []  hx of DVT []  swelling in legs  Pulmonary: [x]  productive cough []  asthma/wheezing []  home O2  Neurologic: []  weakness in []  arms []  legs []  numbness in []  arms []  legs [x]  hx of CVA []  mini stroke [] difficulty speaking or slurred speech []  temporary loss of vision in one eye []  dizziness  Hematologic: []  hx of cancer []  bleeding problems []  problems with blood clotting easily [x]  anemia - takes iron (received 3 units PRBC's this admission)  Endocrine:   []  diabetes []  thyroid disease  GI []  vomiting blood []  blood in stool [x]  hx GIB [x]  diarrhea   GU: [x]  CKD/renal failure []  HD--[]  M/W/F or []  T/T/S []  burning with urination []  blood in urine  Psychiatric: []  anxiety []  depression  Musculoskeletal: []  arthritis [x]  joint pain  Integumentary: []  rashes []  ulcers  Constitutional: [x]  fever [x]  chills [x]  fatigue   Physical Examination  Filed Vitals:   02/02/15 1100  BP: 137/71  Pulse: 127  Temp:   Resp: 18   Body mass index is 19.04 kg/(m^2).  General:  WDWN in NAD Gait: Not observed HENT: WNL, normocephalic Pulmonary: intubated; coarse breath sounds throughout Cardiac: tachycardic/regular, without  Murmurs, rubs or gallops;  Abdomen: soft, NT/ND, no masses Skin: without rashes,  without ulcers  Vascular Exam/Pulses:  Right Left  Radial 2+ (normal) 2+ (normal)  Ulnar Unable to palpate  Unable to palpate   DP 2+ (normal) 2+ (normal)   Extremities: without ischemic changes, without Gangrene , without cellulitis; without open wounds;  Musculoskeletal: no muscle wasting or atrophy  Neurologic: sluggish; follows occasional commands  CBC    Component Value Date/Time   WBC 14.2* 02/02/2015 0245   WBC 5.5 03/01/2012 1128   RBC 3.07* 02/02/2015 0245   RBC 3.56* 03/01/2012 1128   HGB 8.5* 02/02/2015 0245   HGB 9.0* 03/01/2012 1128   HCT 27.9* 02/02/2015 0245   HCT 29.3* 03/01/2012 1128   PLT 389 02/02/2015 0245   MCV 90.9 02/02/2015 0245   MCV 82.4 03/01/2012 1128   MCH 27.7 02/02/2015 0245   MCH 25.3* 03/01/2012 1128   MCHC 30.5 02/02/2015 0245   MCHC 30.7* 03/01/2012 1128   RDW 18.1* 02/02/2015 0245   LYMPHSABS 0.6* 11/18/2014 2324   MONOABS 0.4 11/18/2014 2324   EOSABS 0.0 11/18/2014 2324   BASOSABS 0.0 11/18/2014 2324    BMET    Component Value Date/Time   NA 143 02/02/2015 0245   K 3.7 02/02/2015 0245   CL 107 02/02/2015 0245   CO2 22 02/02/2015 0245   GLUCOSE 118* 02/02/2015 0245   BUN 50* 02/02/2015 0245   CREATININE 6.01* 02/02/2015 0245   CREATININE 3.49* 11/10/2014 1822   CREATININE 0.35* 10/23/2008 2300   CALCIUM 9.8 02/02/2015 0245   CALCIUM 8.9 10/10/2014 1931   GFRNONAA 7* 02/02/2015 0245   GFRAA 8* 02/02/2015 0245    COAGS: Lab Results  Component Value Date   INR 1.29 02/02/2015   INR 1.18 11/19/2014   INR 1.06 09/28/2014     Non-Invasive Vascular Imaging:   Vein mapping pending  Statin:  Yes.   Beta Blocker:  Yes.  Aspirin:  No. ACEI:  No. ARB:  No. Other antiplatelets/anticoagulants:  No.    ASSESSMENT/PLAN: This is a 54 y.o. female with hx of lupus with CKD 4 that has progressed and pt is now in need of diatek catheter and permanent HD access.  The pt is right hand dominant according to her  sister.   -this pt is critically ill and will need to be more stable before going to the OR.  If improvement, may be able to place one day next week.  Will monitor her progress. -will await vein mapping to determine what access will be appropriate. -sister seems to think pt is going for a trach on Monday, however, I do not see this on the OR schedule.   Leontine Locket, PA-C [facility] [phone]  I have examined the patient, reviewed and agree with above. Will follow with you. Will need tunnel catheter once her white count is trending down. Vein mapping for permanent access  Bern Fare, MD 02/02/2015 9:53 PM

## 2015-02-05 NOTE — Procedures (Signed)
**Note De-Identified Aminta Sakurai Obfuscation** Extubation Procedure Note  Patient Details:   Name: [name] DOB: 1961-06-16 MRN: [id number]   Airway Documentation:     Evaluation  O2 sats: stable throughout Complications: No apparent complications Patient did tolerate procedure well. Bilateral Breath Sounds: Clear, Diminished Suctioning: Oral Yes No stridor, + leak Dalyah Pla, Penni Bombard 02/05/2015, 12:55 PM

## 2015-02-05 NOTE — Progress Notes (Signed)
PT Cancellation Note  Patient Details Name: [name] [name] MRN: [id number] DOB: 12-06-1960   Cancelled Treatment:    Reason Eval/Treat Not Completed: Patient not medically ready. Pt to OR for tunneled HD cath placement with possible extubation after. PT will hold treatment and check back for appropriateness for therapy tomorrow.    Rolinda Roan 02/05/2015, 11:02 AM   Rolinda Roan, PT, DPT Acute Rehabilitation Services Pager: [phone]

## 2015-02-05 NOTE — Transfer of Care (Signed)
Immediate Anesthesia Transfer of Care Note  Patient: [name] [name]  Procedure(s) Performed: Procedure(s): INSERTION OF DIALYSIS CATHETER - RIGHT INTERNAL JUGULAR (Right)  Patient Location: ICU  Anesthesia Type:General  Level of Consciousness: sedated  Airway & Oxygen Therapy: Patient remains intubated per anesthesia plan and Patient placed on Ventilator (see vital sign flow sheet for setting)  Post-op Assessment: Report given to RN and Post -op Vital signs reviewed and stable  Post vital signs: Reviewed and stable  Last Vitals:  Filed Vitals:   02/05/15 0846  BP:   Pulse:   Temp: 37.2 C  Resp:   HR 115, BP 140/70, Sats 275%, R 16  Complications: No apparent anesthesia complications

## 2015-02-06 ENCOUNTER — Encounter (HOSPITAL_COMMUNITY): Payer: Self-pay | Admitting: Vascular Surgery

## 2015-02-06 LAB — RENAL FUNCTION PANEL
Albumin: 2 g/dL — ABNORMAL LOW (ref 3.5–5.2)
Anion gap: 10 (ref 5–15)
BUN: 48 mg/dL — ABNORMAL HIGH (ref 6–23)
CO2: 24 mmol/L (ref 19–32)
Calcium: 9.5 mg/dL (ref 8.4–10.5)
Chloride: 106 mmol/L (ref 96–112)
Creatinine, Ser: 6.59 mg/dL — ABNORMAL HIGH (ref 0.50–1.10)
GFR calc Af Amer: 8 mL/min — ABNORMAL LOW (ref >90)
GFR calc non Af Amer: 7 mL/min — ABNORMAL LOW (ref >90)
Glucose, Bld: 110 mg/dL — ABNORMAL HIGH (ref 70–99)
Phosphorus: 4 mg/dL (ref 2.3–4.6)
Potassium: 3.7 mmol/L (ref 3.5–5.1)
Sodium: 140 mmol/L (ref 135–145)

## 2015-02-06 LAB — CBC
HCT: 24.9 % — ABNORMAL LOW (ref 36.0–46.0)
Hemoglobin: 7.7 g/dL — ABNORMAL LOW (ref 12.0–15.0)
MCH: 27.6 pg (ref 26.0–34.0)
MCHC: 30.9 g/dL (ref 30.0–36.0)
MCV: 89.2 fL (ref 78.0–100.0)
Platelets: 357 10*3/uL (ref 150–400)
RBC: 2.79 MIL/uL — ABNORMAL LOW (ref 3.87–5.11)
RDW: 17.3 % — AB (ref 11.5–15.5)
WBC: 6.4 10*3/uL (ref 4.0–10.5)

## 2015-02-06 LAB — GLUCOSE, CAPILLARY
GLUCOSE-CAPILLARY: 113 mg/dL — AB (ref 70–99)
GLUCOSE-CAPILLARY: 147 mg/dL — AB (ref 70–99)
GLUCOSE-CAPILLARY: 143 mg/dL — AB (ref 70–99)
Glucose-Capillary: 111 mg/dL — ABNORMAL HIGH (ref 70–99)
Glucose-Capillary: 134 mg/dL — ABNORMAL HIGH (ref 70–99)

## 2015-02-06 LAB — OLIGOCLONAL BANDS, CSF + SERM

## 2015-02-06 MED ORDER — CHLORHEXIDINE GLUCONATE 0.12 % MT SOLN
15.0000 mL | Freq: Two times a day (BID) | OROMUCOSAL | Status: DC
  Administered 2015-02-06 – 2015-02-15 (×14): 15 mL via OROMUCOSAL
  Filled 2015-02-06 (×19): qty 15

## 2015-02-06 MED ORDER — CETYLPYRIDINIUM CHLORIDE 0.05 % MT LIQD
7.0000 mL | Freq: Two times a day (BID) | OROMUCOSAL | Status: DC
Start: 2015-02-07 — End: 2015-02-15
  Administered 2015-02-07 – 2015-02-15 (×13): 7 mL via OROMUCOSAL

## 2015-02-06 MED ORDER — CETYLPYRIDINIUM CHLORIDE 0.05 % MT LIQD
7.0000 mL | Freq: Two times a day (BID) | OROMUCOSAL | Status: DC
  Administered 2015-02-06: 7 mL via OROMUCOSAL

## 2015-02-06 MED ORDER — DARBEPOETIN ALFA 150 MCG/0.3ML IJ SOSY
PREFILLED_SYRINGE | INTRAMUSCULAR | Status: AC
  Filled 2015-02-06: qty 0.3

## 2015-02-06 MED ORDER — WHITE PETROLATUM GEL
Status: AC
  Administered 2015-02-06: 0.2
  Filled 2015-02-06: qty 1

## 2015-02-06 NOTE — Progress Notes (Signed)
S: Denies pain.  No SOB O:BP 142/62 mmHg  Pulse 98  Temp(Src) 97.9 F (36.6 C) (Oral)  Resp 23  Wt 52.5 kg (115 lb 11.9 oz)  SpO2 100%  LMP 07/18/2012  Intake/Output Summary (Last 24 hours) at 02/06/15 0733 Last data filed at 02/06/15 0400  Gross per 24 hour  Intake   1140 ml  Output     35 ml  Net   1105 ml   Weight change: 1.7 kg (3 lb 12 oz) Gen: Extubated CVS: RRR Resp: Clear Abd: + BS NTND No HSM Ext: No edema NEURO: Awake and alert.  Ox1 Rt IJ permcath.  Lt IJ triple lumen   . antiseptic oral rinse  7 mL Mouth Rinse QID  . chlorhexidine  15 mL Mouth Rinse BID  . darbepoetin (ARANESP) injection - DIALYSIS  150 mcg Intravenous Q Tue-HD  . folic acid  1 mg Oral Daily  . heparin subcutaneous  5,000 Units Subcutaneous 3 times per day  . hydroxychloroquine  400 mg Oral Daily  . labetalol  200 mg Per Tube 3 times per day  . multivitamin  1 tablet Oral QHS  . mycophenolate (CELLCEPT) IV  1,000 mg Intravenous Q12H  . pantoprazole sodium  40 mg Per Tube QHS  . thiamine  100 mg Oral Daily   Dg Chest Port 1 View  02/05/2015   CLINICAL DATA:  Dialysis catheter insertion  EXAM: PORTABLE CHEST - 1 VIEW  COMPARISON:  Study obtained earlier in the day  FINDINGS: Dual-lumen catheter present with tips near the cavoatrial junction. Left jugular catheter tip is in the superior vena cava. Feeding tube tip is below the diaphragm. Endotracheal tube is been removed. No pneumothorax. No edema or consolidation. Heart is upper normal in size with pulmonary vascular within normal limits. No adenopathy.  IMPRESSION: Tube and catheter positions as described without pneumothorax. No edema or consolidation.   Electronically Signed   By: Lowella Grip III M.D.   On: 02/05/2015 13:47   Dg Chest Port 1 View  02/05/2015   CLINICAL DATA:  Acute respiratory failure, subsequent encounter.  EXAM: PORTABLE CHEST - 1 VIEW  COMPARISON:  02/04/2015.  FINDINGS: Endotracheal tube terminates approximately 5.3 cm  above the carina. Feeding tube is followed into the stomach. Right IJ and left IJ central line tips project over the upper SVC. Heart size stable. Continued regression of left perihilar airspace opacification. Linear opacities are again seen in the left lower lobe. Lungs are otherwise clear. No pleural fluid.  IMPRESSION: Continued improvement in left perihilar airspace opacification. Probable atelectasis in the left lower lobe, stable.   Electronically Signed   By: Lorin Picket M.D.   On: 02/05/2015 07:21   Dg Fluoro Guide Cv Line-no Report  02/05/2015   CLINICAL DATA:    FLOURO GUIDE CV LINE  Fluoroscopy was utilized by the requesting physician.  No radiographic  interpretation.    BMET    Component Value Date/Time   NA 140 02/06/2015 0447   K 3.7 02/06/2015 0447   CL 106 02/06/2015 0447   CO2 24 02/06/2015 0447   GLUCOSE 110* 02/06/2015 0447   BUN 48* 02/06/2015 0447   CREATININE 6.59* 02/06/2015 0447   CREATININE 3.49* 11/10/2014 1822   CREATININE 0.35* 10/23/2008 2300   CALCIUM 9.5 02/06/2015 0447   CALCIUM 8.9 10/10/2014 1931   GFRNONAA 7* 02/06/2015 0447   GFRAA 8* 02/06/2015 0447   CBC    Component Value Date/Time   WBC 6.4  02/06/2015 0447   WBC 5.5 03/01/2012 1128   RBC 2.79* 02/06/2015 0447   RBC 3.56* 03/01/2012 1128   HGB 7.7* 02/06/2015 0447   HGB 9.0* 03/01/2012 1128   HCT 24.9* 02/06/2015 0447   HCT 29.3* 03/01/2012 1128   PLT 357 02/06/2015 0447   MCV 89.2 02/06/2015 0447   MCV 82.4 03/01/2012 1128   MCH 27.6 02/06/2015 0447   MCH 25.3* 03/01/2012 1128   MCHC 30.9 02/06/2015 0447   MCHC 30.7* 03/01/2012 1128   RDW 17.3* 02/06/2015 0447   LYMPHSABS 0.6* 11/18/2014 2324   MONOABS 0.4 11/18/2014 2324   EOSABS 0.0 11/18/2014 2324   BASOSABS 0.0 11/18/2014 2324     Assessment: 1. 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 Anemia on aranesp 3. Sec HPTH, Start sensipar when able to take PO meds 4. SLE  Plan: 1. HD today   Douglas Rooks T

## 2015-02-06 NOTE — Progress Notes (Signed)
Physical Therapy Treatment Patient Details Name: [name] [name] MRN: [id number] DOB: [birth year] Today's Date: 02/06/2015    History of Present Illness Pt is a 54 y.o. female presents with SOB on 01/18/15. In addition she had labwork in the ED and this shows significant anemia. Patient has been having anemia for some time. She was recently in the hospital for the anemia but a workup was not conclusive. Also she had a minimally elevated troponin level. Pt required NRB on 3/19 and CVVHD was initiated on 3/21.     PT Comments    Pt progressing towards physical therapy goals. Was able to show increased participation in gross mobility today, and was able to tolerate sitting LE exercise at EOB. Pt also showing more trunk control and independence with bed mobility. Will continue to follow and progress as able per POC.   Follow Up Recommendations  CIR;Supervision/Assistance - 24 hour (Pending progress with PT; may require SNF)     Equipment Recommendations  None recommended by PT    Recommendations for Other Services OT consult;Speech consult     Precautions / Restrictions Precautions Precautions: Fall Restrictions Weight Bearing Restrictions: No    Mobility  Bed Mobility Overal bed mobility: Needs Assistance;+2 for physical assistance Bed Mobility: Supine to Sit;Sit to Supine     Supine to sit: Mod assist;+2 for physical assistance Sit to supine: Mod assist;+2 for physical assistance   General bed mobility comments: Pt required assist for all aspects of bed mobility, although initiating more movement than previous session. Bed pad was used for scooting and positioning.   Transfers                 General transfer comment: Further mobility deferred as HD was present to take pt for session.   Ambulation/Gait                 Stairs            Wheelchair Mobility    Modified Rankin (Stroke Patients Only)       Balance Overall balance assessment: Needs  assistance Sitting-balance support: Feet supported;No upper extremity supported Sitting balance-Leahy Scale: Poor                              Cognition Arousal/Alertness: Awake/alert Behavior During Therapy: WFL for tasks assessed/performed Overall Cognitive Status: Within Functional Limits for tasks assessed                      Exercises General Exercises - Lower Extremity Long Arc Quad: 10 reps;Both Hip Flexion/Marching: 5 reps;Both    General Comments General comments (skin integrity, edema, etc.): Pt was able to tolerate sitting activity for trunk stability at EOB. Pt was able to bear weight through L elbow on bed with activation of shoulder girdle musculature noted. Pt required increased assist to lean to the right and bear weight through the RUE. Pt able to push herself back up into upright sitting with minimal assist each time.       Pertinent Vitals/Pain Pain Assessment: No/denies pain Faces Pain Scale: No hurt    Home Living                      Prior Function            PT Goals (current goals can now be found in the care plan section) Acute Rehab PT Goals Patient Stated Goal:  none stated PT Goal Formulation: Patient unable to participate in goal setting Time For Goal Achievement: 02/14/15 Potential to Achieve Goals: Fair Progress towards PT goals: Progressing toward goals    Frequency  Min 3X/week    PT Plan Frequency needs to be updated    Co-evaluation             End of Session   Activity Tolerance: Patient limited by lethargy Patient left: in bed;with call bell/phone within reach     Time: 1333-1353 PT Time Calculation (min) (ACUTE ONLY): 20 min  Charges:  $Therapeutic Activity: 8-22 mins                    G Codes:      Rolinda Roan 02-11-15, 2:41 PM   Rolinda Roan, PT, DPT Acute Rehabilitation Services Pager: [phone]

## 2015-02-06 NOTE — Procedures (Signed)
Pt seen on HD.  Ap 80 Vp 60 BFR 280..just getting started.  Increase BFR to 400.

## 2015-02-06 NOTE — Progress Notes (Signed)
PULMONARY / CRITICAL CARE MEDICINE   Name: ELMO SHUMARD MRN: [id number] DOB: May 22, 1961    ADMISSION DATE:  01/18/2015 CONSULTATION DATE:  3/20  REFERRING MD :  Eliseo Squires   CHIEF COMPLAINT:  Tachycardia, resp failure   INITIAL PRESENTATION:  54 y/o female with hx lupus (on cellcept, plaquenil) and associated CKD, DM, HTN initially presented 3/17 with vague c/o SOB, diarrhea, malaise, weakness, cough.  She was admitted by [facility] and r/o for flu.  Was undergoing further w/u for diarrhea, mildly elevated troponin and anemia.  Overnight 3/19 developed intermittent SVT, then persistent tachycardia with HR 150's and worsening SOB and hypoxia requiring NRB.  PCCM consulted 3/20.  She required ETT/MV 3/20. CXR with B infiltrates. Initiated CVVHD on 3/21 pm.   STUDIES:  CXR 3/20>>> neg  2D echo 3/19>>> XB93-90%, grade 1 diastolic dysfunction, mod AR, mod MR, mod TR. No pericardial effusion.  LE doppler US 3/20 >> negative   SIGNIFICANT EVENTS: 3/21  Adenosine challenge >> appears to be sinus rhythm with some non-conducted p-waves 3/23  Tolerated CVVHD, I/O even last 24 h, tachycardia improved, fent + propofol 3/24  High residuals overnight, on max dose fentanyl.  Rx'd with Reglan  3/26  Continues to have agitation with WUA / SBT, changed back to propofol/fentanyl  3/28 seroquel trance, neuro consult 3/29 more awake, follows commands 3/30 reintubated, bronchoscopy - for LLL atx 4/3 fentanyl drip stopped 4/4: Got tunneled cath today with no complications 4/4  Extubated    SUBJECTIVE: afebrile mildly tachy, BP stable  Lethargic but following commands Hoarseness  VITAL SIGNS: Temp:  [97.2 F (36.2 C)-98.9 F (37.2 C)] 98.5 F (36.9 C) (04/05 0700) Pulse Rate:  [77-119] 98 (04/05 0400) Resp:  [9-29] 23 (04/05 0400) BP: (96-156)/(49-75) 142/62 mmHg (04/05 0400) SpO2:  [97 %-100 %] 100 % (04/05 0400) FiO2 (%):  [40 %] 40 % (04/04 1130) Weight:  [115 lb 11.9 oz (52.5 kg)] 115 lb 11.9 oz  (52.5 kg) (04/05 0500)   VENTILATOR SETTINGS: Vent Mode:  [-] CPAP;PSV FiO2 (%):  [40 %] 40 % PEEP:  [5 cmH20] 5 cmH20 Pressure Support:  [16 cmH20] 16 cmH20   INTAKE / OUTPUT:  Intake/Output Summary (Last 24 hours) at 02/06/15 0831 Last data filed at 02/06/15 0700  Gross per 24 hour  Intake   1310 ml  Output     10 ml  Net   1300 ml    PHYSICAL EXAMINATION: General:  Chronically ill appearing female, hoarseness Neuro:   follows commands , strength 3/5  HEENT:  MM dry, right HD cath in place with no bleeding. Left IJ in place Cardiovascular:  Mildly tachy, S1 and S2 present Lungs:  Clear bilaterally  Abdomen:  round, mildly distended, soft, BSx4 active   Musculoskeletal:  Warm and dry, no edema Neuro: slow to respond but following commands  LABS:  CBC  Recent Labs Lab 02/04/15 0500 02/05/15 0230 02/06/15 0447  WBC 7.1 6.8 6.4  HGB 7.9* 7.5* 7.7*  HCT 26.1* 24.0* 24.9*  PLT 371 353 357   Coag's  Recent Labs Lab 02/02/15 0245  APTT 46*  INR 1.29    BMET  Recent Labs Lab 02/04/15 0435 02/05/15 0230 02/06/15 0447  NA 138 138 140  K 3.8 3.8 3.7  CL 100 103 106  CO2 23 23 24   BUN 31* 43* 48*  CREATININE 4.12* 5.38* 6.59*  GLUCOSE 154* 149* 110*   Electrolytes  Recent Labs Lab 02/03/15 2305 02/04/15 0435 02/05/15 0230  02/06/15 0447  CALCIUM  --  9.5 9.4 9.5  MG 2.0 2.0 2.0  --   PHOS 3.4 3.4  3.4 3.3  3.3 4.0   Sepsis Markers No results for input(s): LATICACIDVEN, PROCALCITON, O2SATVEN in the last 168 hours.   ABG  Recent Labs Lab 01/31/15 1429 01/31/15 1700 01/31/15 1725  PHART 7.383 7.303* 7.381  PCO2ART 34.3* 45.8* 36.4  PO2ART 52.0* 18.0* 208.0*   Liver Enzymes  Recent Labs Lab 02/04/15 0435 02/05/15 0230 02/06/15 0447  ALBUMIN 2.2* 2.0* 2.0*     Cardiac Enzymes No results for input(s): TROPONINI, PROBNP in the last 168 hours. Glucose  Recent Labs Lab 02/05/15 1204 02/05/15 1634 02/05/15 2003 02/05/15 2330  02/06/15 0352 02/06/15 0736  GLUCAP 143* 120* 100* 111* 113* 134*    Imaging Dg Chest Port 1 View  02/05/2015   CLINICAL DATA:  Dialysis catheter insertion  EXAM: PORTABLE CHEST - 1 VIEW  COMPARISON:  Study obtained earlier in the day  FINDINGS: Dual-lumen catheter present with tips near the cavoatrial junction. Left jugular catheter tip is in the superior vena cava. Feeding tube tip is below the diaphragm. Endotracheal tube is been removed. No pneumothorax. No edema or consolidation. Heart is upper normal in size with pulmonary vascular within normal limits. No adenopathy.  IMPRESSION: Tube and catheter positions as described without pneumothorax. No edema or consolidation.   Electronically Signed   By: Lowella Grip III M.D.   On: 02/05/2015 13:47   Dg Chest Port 1 View  02/05/2015   CLINICAL DATA:  Acute respiratory failure, subsequent encounter.  EXAM: PORTABLE CHEST - 1 VIEW  COMPARISON:  02/04/2015.  FINDINGS: Endotracheal tube terminates approximately 5.3 cm above the carina. Feeding tube is followed into the stomach. Right IJ and left IJ central line tips project over the upper SVC. Heart size stable. Continued regression of left perihilar airspace opacification. Linear opacities are again seen in the left lower lobe. Lungs are otherwise clear. No pleural fluid.  IMPRESSION: Continued improvement in left perihilar airspace opacification. Probable atelectasis in the left lower lobe, stable.   Electronically Signed   By: Lorin Picket M.D.   On: 02/05/2015 07:21   Dg Fluoro Guide Cv Line-no Report  02/05/2015   CLINICAL DATA:    FLOURO GUIDE CV LINE  Fluoroscopy was utilized by the requesting physician.  No radiographic  interpretation.     ASSESSMENT / PLAN:  PULMONARY Acute hypoxic respiratory failure - unknown etiology but in setting B infiltrates.  -treated for HCAP, PE (d/c'd heparin 3/23). Consider acute pulmonary edema in setting HTN and renal failure-  infiltrates have completely  cleared. Most likely cause is cardiogenic edema.  LLL atx s/p bronch 3/30 -resolved ETT 3/20 >> 3/29 reintubated 3/30 >> extubated on 4/4 P:   Cont monitoring O2 sats 48hr post extubation   CARDIOVASCULAR SVT/sinus tachycardia - HR was 150 at time of her acute decompensation, Adenosine given, appeared to be sinus with some non-conducted p-waves. Presumed reactive to critical illness Hypotension improved Mildly elevated troponin peak 0.74 dCHF - new.  EF 40-45% with MOD aortic, tricuspid and mitral regurg.  PE was questioned but  BLE venous dopplers prelim negative; doubt PE (heparin gtt stopped 3/23) P:  Dc Hydralazine PRN  Ct  labetalol 200 mg tID (reduced home dose) -via NGT Hold home clonidine Use lopressor prn for HR 130 & above MAP goal >60  RENAL Acute on chronic renal failure, presume lupus nephritis.  Hyponatremia  Metabolic acidosis P:  Appreciate renal service's assistance To HD unit today  GASTROINTESTINAL Diarrhea - initial, resolved.  New with reglan 3/26, flexi seal placed.  Abd pain - neg abd xray  Protein Calorie Malnutrition  P:   TF as needed Getting speech eval with mod barium after intubation x2  HEMATOLOGIC Leukocytosis- Resolved Anemia - s/p 1 unit PRBC 3/22,  Hgb with slight gradual drop since then but received Aranesp 4/3 P:  Cont iron   INFECTIOUS Fever - resolved  Immunocompromised Host Diarrhea - resolved Unclear etiology of infection, consider PNA in immunocompromised host but CXR has quickly cleared.  There was a question of GI source given diarrhea v viral v other source (ie lupus pneumonitis) but diarrhea now resolving.  Immunocompromised on rx for lupus.  P:   BCx2 3/20 >>>neg UC 3/20 >>> negative BAL 3/30 >> candida  Vanc 3/20 (empiric) >>> 3/22, 3/30 >>4/2 Zosyn 3/20 (empiric) >>>3/28 Cefepime 3/30 >> (LLL atx) 4/3 planned  cellcept & plaquenil resumed on 4/1  ENDOCRINE No active issue    P:   Monitor glucose    NEUROLOGIC Acute encephalopathy -fluctuating- Head Ct neg 0/94 EEG 7/09 metabolic enceph MRI 6/28 >> Remote RIGHT inferior brainstem hemorrhage, stable RIGHT basal ganglia micro hemorrhage, Remote small RIGHT inferior cerebellar infarct. CSF 3/31 neg for lupus cerebri Neuro signed off on 4/3.  P:   RASS goal: 0  Keep PRN sedation to a minimum May need re evaluation by Neurology due to  PT eval and treat   FAMILY  - Updates: Mother & sister updated on 4/3  Summary -Improved mental status. Tolerating extubation well. Will get speech mod barium swallow before oral feeds. Transfer to [facility], to SDU with iHD per renal.    Blain Pais, MD IMTS, PGY3   02/06/2015, 8:31 AM  PCCM ATTENDING: I have reviewed pt's initial presentation, consultants notes and hospital database in detail.  The above assessment and plan was formulated under my direction.  In summary: SLE Recurrent VDRF - mostly due to impaired cognition AMS - improving. Etiology remains unclear ESRD - s/p tunneled catheter placement 4/04  Renal service following She has tolerated extubation 4/04 well. Transfer to SDU and TRH to resume primary duties. Renal following. Neurology has signed off. She will ultimately need substantial rehab after this prolonged critical illness   Merton Border, MD;  PCCM service; Mobile [phone]

## 2015-02-06 NOTE — Evaluation (Signed)
Clinical/Bedside Swallow Evaluation Patient Details  Name: [name] [name] MRN: [id number] Date of Birth: November 14, 1960  Today's Date: 02/06/2015 Time: SLP Start Time (ACUTE ONLY): 6256 SLP Stop Time (ACUTE ONLY): 0858 SLP Time Calculation (min) (ACUTE ONLY): 20 min  Past Medical History:  Past Medical History  Diagnosis Date  . Hypertension   . Lupus   . CKD (chronic kidney disease)     due to lupus/Dr. Justin [name]  . Diabetes mellitus   . Stenosis of cervical spine region     with HNP at C5/6, C6/7  . Metatarsal bone fracture right    4th  . 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 Arthritis     RA  . Anemia   . Stroke 11/2014    left sided weakness, dysphagia  . Pain in joints   . Paresthesias   . Hyperlipidemia   . Colitis     ? per hospital notes 11/2014  . Lower GI bleed   . Hyponatremia   . Metabolic acidosis   . Hyperplastic colon polyp   . Hemorrhoids   . Blood transfusion without reported diagnosis    Past Surgical History:  Past Surgical History  Procedure Laterality Date  . Breast biopsy    . Tubal ligation     HPI:  54 y.o. female with PMH: lupus, CKD. DM, CVA (pons discharged on regular diet and thin 11/2014), lower GI bleed admitted with with shortness of breath. Pt experienced acute hypoxic respiratory failure on 3/20, requiring intubation; extubated 3/29. She required reintubation 3/30-4/4. Bedside swallow 01/30/15 completed revealing multiple s/s aspiration , reduced hyolaryngeal excursion. FEES 10/04/14 showed penetration of thin liquids during consumption of large, consecutive boluses. Pt with adequate sensation, and spontaneously cleared throat, which effectively expelled penetrate.CXR continued improvement in left perihilar airspace opacification.   Assessment / Plan / Recommendation Clinical Impression  Pt with multi factor aspiration risks including recent pons CVA, 15 day intubation and significant generalized  weakness/weak congested cough. Oropharyngeal impairments present included decreased laryngeal elevation, suspected delayed swallow initiation and decreased airway protection. Recommend FEES 4/6 to allow suspected edema to decrease, ice chips PRN following oral care.       Aspiration Risk  Severe    Diet Recommendation Ice chips PRN after oral care   Medication Administration: Via alternative means    Other  Recommendations Recommended Consults: FEES Oral Care Recommendations: Oral care Q4 per protocol   Follow Up Recommendations  Skilled Nursing facility    Frequency and Duration min 2x/week  2 weeks   Pertinent Vitals/Pain none         Swallow Study          Oral/Motor/Sensory Function Overall Oral Motor/Sensory Function:  (generalized weakness, has sore from intubation on philtrum)   Ice Chips Ice chips: Impaired Presentation: Spoon Pharyngeal Phase Impairments: Suspected delayed Swallow;Decreased hyoid-laryngeal movement   Thin Liquid Thin Liquid: Impaired Presentation: Spoon Pharyngeal  Phase Impairments: Suspected delayed Swallow;Decreased hyoid-laryngeal movement    Nectar Thick Nectar Thick Liquid: Not tested   Honey Thick Honey Thick Liquid: Not tested   Puree Puree: Impaired Presentation: Spoon Pharyngeal Phase Impairments: Throat Clearing - Immediate;Suspected delayed Swallow;Decreased hyoid-laryngeal movement   Solid   GO    Solid: Not tested       [name] [name] 02/06/2015,9:27 AM  [name] [name] [name] [name].Ed [employer] [phone]

## 2015-02-07 ENCOUNTER — Ambulatory Visit: Payer: Managed Care, Other (non HMO) | Admitting: Gastroenterology

## 2015-02-07 DIAGNOSIS — R5381 Other malaise: Secondary | ICD-10-CM | POA: Diagnosis not present

## 2015-02-07 LAB — RENAL FUNCTION PANEL
ANION GAP: 10 (ref 5–15)
Albumin: 2.2 g/dL — ABNORMAL LOW (ref 3.5–5.2)
BUN: 13 mg/dL (ref 6–23)
CALCIUM: 9.5 mg/dL (ref 8.4–10.5)
CO2: 28 mmol/L (ref 19–32)
Chloride: 102 mmol/L (ref 96–112)
Creatinine, Ser: 2.79 mg/dL — ABNORMAL HIGH (ref 0.50–1.10)
GFR calc non Af Amer: 18 mL/min — ABNORMAL LOW (ref >90)
GFR, EST AFRICAN AMERICAN: 21 mL/min — AB (ref >90)
GLUCOSE: 146 mg/dL — AB (ref 70–99)
POTASSIUM: 3.6 mmol/L (ref 3.5–5.1)
Phosphorus: 2.1 mg/dL — ABNORMAL LOW (ref 2.3–4.6)
SODIUM: 140 mmol/L (ref 135–145)

## 2015-02-07 LAB — GLUCOSE, CAPILLARY
GLUCOSE-CAPILLARY: 139 mg/dL — AB (ref 70–99)
Glucose-Capillary: 122 mg/dL — ABNORMAL HIGH (ref 70–99)
Glucose-Capillary: 134 mg/dL — ABNORMAL HIGH (ref 70–99)
Glucose-Capillary: 135 mg/dL — ABNORMAL HIGH (ref 70–99)
Glucose-Capillary: 148 mg/dL — ABNORMAL HIGH (ref 70–99)
Glucose-Capillary: 150 mg/dL — ABNORMAL HIGH (ref 70–99)

## 2015-02-07 MED ORDER — MYCOPHENOLATE MOFETIL HCL 500 MG IV SOLR
500.0000 mg | Freq: Two times a day (BID) | INTRAVENOUS | Status: DC
  Administered 2015-02-07 – 2015-02-08 (×2): 500 mg via INTRAVENOUS
  Filled 2015-02-07 (×6): qty 15

## 2015-02-07 MED ORDER — VITAL AF 1.2 CAL PO LIQD
1000.0000 mL | ORAL | Status: DC
  Administered 2015-02-07 – 2015-02-10 (×4): 1000 mL
  Filled 2015-02-07 (×11): qty 1000

## 2015-02-07 NOTE — Progress Notes (Signed)
Called report to nurse on 6E.  All questions answered.  Pt alert and oriented, but withdrawn.  On Room air.  Tube feeds at 55cc/hr. Transporting on telemetry

## 2015-02-07 NOTE — Progress Notes (Signed)
I await further progress with therapy to determine tolerance for more intense therapies. I have ordered OT eval. Rehab venue pending her tolerance with therapies, [employer] approval and bed availability when pt medically ready. [phone]

## 2015-02-07 NOTE — Consult Note (Signed)
Physical Medicine and Rehabilitation Consult Reason for Consult: Debilitation/respiratory failure/acute encephalopathy Referring Physician: [facility]   HPI: [name] [name] is a 54 y.o. right handed female with history of remote right inferior brainstem hemorrhage, hypertension, diabetes mellitus or peripheral neuropathy, chronic renal insufficiency with baseline creatinine 2.97, lupus. Independent with a cane prior to admission living with her-2 adult sons. Presented 01/18/2015 with vague complaints of shortness of breath, diarrhea, [place] and cough. Troponin mildly elevated 0.57. Chest x-ray showed no acute pulmonary process. Patient developed intermittent SVT with persistent tachycardia heart rate 150s became hypoxic requiring NRB and vent support. Cranial CT scan negative. MRI of the brain again showed remote right inferior brainstem hemorrhage without acute findings.. Venous Doppler study negative. Echocardiogram with ejection fraction of 50% severe hypokinesis. EEG with no seizure activity but did show diffuse disturbance etiologically nonspecific suspect metabolic encephalopathy. Acute on chronic renal failure follow-up renal services hemodialysis initiated with plan for AVF when more stable. Patient was extubated 01/05/2015. Currently nothing by mouth with nasogastric tube feeds. Subcutaneous heparin for DVT prophylaxis. Physical therapy evaluation completed 02/06/2015 with recommendations of physical medicine rehabilitation consult.  Per Mom, pt used cane to amb at home.  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 Hypertension   . Lupus   . CKD (chronic kidney disease)     due to lupus/Dr. [name]  . Diabetes mellitus   . Stenosis of cervical spine region     with HNP at C5/6, C6/7    . Metatarsal bone fracture right    4th  . 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 Arthritis     RA  . Anemia   . Stroke 11/2014    left sided weakness, dysphagia  . Pain in joints   . Paresthesias   . Hyperlipidemia   . Colitis     ? per hospital notes 11/2014  . Lower GI bleed   . Hyponatremia   . Metabolic acidosis   . Hyperplastic colon polyp   . Hemorrhoids   . Blood transfusion without reported diagnosis    Past Surgical History  Procedure Laterality Date  . Breast biopsy    . Tubal ligation    . Insertion of dialysis catheter Right 02/05/2015    Procedure: INSERTION OF DIALYSIS CATHETER - RIGHT INTERNAL JUGULAR;  Surgeon: Conrad , MD;  Location: [place];  Service: Vascular;  Laterality: Right;   Family History  Problem Relation Age of Onset  . Hypertension    . Lupus    . Rheum arthritis    . Hypertension Mother   . Diabetes Mother   . Hypertension Sister   . Diabetes Father   . Hypertension Maternal Grandmother    Social History:  reports that she has quit smoking. Her smoking use included Cigarettes. She has a 15 pack-year smoking history. She has never used smokeless tobacco. She reports that she does not drink alcohol or use illicit drugs. Allergies:  Allergies  Allergen Reactions  . Sulfa Antibiotics Itching   Medications Prior to Admission  Medication Sig Dispense Refill  . acetaminophen (TYLENOL) 325 MG tablet Take 2 tablets (650 mg total) by mouth  every 4 (four) hours as needed for mild pain.    Marland Kitchen ALPRAZolam (XANAX) 0.5 MG tablet Take 1 tablet (0.5 mg total) by mouth 2 (two) times daily as needed for anxiety. 15 tablet 0  . gabapentin (NEURONTIN) 100 MG capsule Take 1 capsule (100 mg total) by mouth at bedtime. (Patient taking differently: Take 100 mg by mouth 3 (three) times daily. ) 30 capsule 0  . labetalol (NORMODYNE) 300 MG tablet Take 2 tablets (600 mg total) by mouth 2 (two) times daily. 120 tablet  0  . mycophenolate (CELLCEPT) 250 MG capsule Take 4 capsules (1,000 mg total) by mouth 2 (two) times daily. 320 capsule 1  . senna-docusate (SENOKOT-S) 8.6-50 MG per tablet Take 1 tablet by mouth 2 (two) times daily. (Patient taking differently: Take 1 tablet by mouth 2 (two) times daily as needed. ) 60 tablet 1  . sodium bicarbonate 650 MG tablet Take 2 tablets (1,300 mg total) by mouth 2 (two) times daily. 120 tablet 1  . Vitamin D, Ergocalciferol, (DRISDOL) 50000 UNITS CAPS capsule Take 1 capsule (50,000 Units total) by mouth every Wednesday. 4 capsule 1  . atorvastatin (LIPITOR) 20 MG tablet Take 1 tablet (20 mg total) by mouth daily at 6 PM. 30 tablet 1  . cloNIDine (CATAPRES) 0.3 MG tablet Take 1 tablet (0.3 mg total) by mouth 3 (three) times daily. 90 tablet 1  . clotrimazole (MYCELEX) 10 MG troche Take 1 tablet (10 mg total) by mouth 5 (five) times daily. 30 tablet 0  . cyclobenzaprine (FLEXERIL) 5 MG tablet Take 5 mg by mouth 3 (three) times daily as needed for muscle spasms.     . diclofenac sodium (VOLTAREN) 1 % GEL Apply 2 g topically 4 (four) times daily. To forefoot 4 Tube 0  . ferrous sulfate 325 (65 FE) MG tablet Take 1 tablet (325 mg total) by mouth daily with breakfast. 30 tablet 1  . furosemide (LASIX) 80 MG tablet Take 80 mg by mouth 2 (two) times daily.  0  . hydrALAZINE (APRESOLINE) 25 MG tablet Take 25 mg by mouth every 8 (eight) hours.  0  . hydroxychloroquine (PLAQUENIL) 200 MG tablet Take 2 tablets (400 mg total) by mouth daily. 30 tablet 1  . lidocaine (XYLOCAINE) 2 % solution     . Menthol-Methyl Salicylate (MUSCLE RUB) 10-15 % CREA Apply 1 application topically 3 (three) times daily before meals. 85 g 0  . multivitamin (RENA-VIT) TABS tablet Take 1 tablet by mouth daily.    Marland Kitchen nystatin-triamcinolone (MYCOLOG II) cream Apply topically 2 (two) times daily.  0  . nystatin-triamcinolone ointment (MYCOLOG) Apply 1 application topically 2 (two) times daily. 30 g 0  .  pantoprazole (PROTONIX) 40 MG tablet Take 1 tablet (40 mg total) by mouth daily. 30 tablet 11  . pramoxine-hydrocortisone (PROCTOCREAM-HC) 1-1 % rectal cream Place 1 application rectally 2 (two) times daily. (Patient taking differently: Place 1 application rectally 2 (two) times daily as needed for hemorrhoids or itching. ) 30 g 1    Home: Home Living Family/patient expects to be discharged to:: Private residence Living Arrangements: Children Available Help at Discharge: Available 24 hours/day, Family Type of Home: Apartment Home Access: Stairs to enter Technical brewer of Steps: 4 Entrance Stairs-Rails: Right Home Layout: Two level, Able to live on main level with bedroom/bathroom Alternate Level Stairs-Number of Steps: flight Home Equipment: Environmental consultant - 2 wheels, Cane - single point, Shower seat, Bedside commode Additional Comments: Pt was previously receiving HHOT and PT  Functional History: Prior Function Level of Independence: Independent with assistive device(s) Comments: Pt reports on her bad days she uses the cane or the RW.   Functional Status:  Mobility: Bed Mobility Overal bed mobility: Needs Assistance, +2 for physical assistance Bed Mobility: Supine to Sit, Sit to Supine Supine to sit: Mod assist, +2 for physical assistance Sit to supine: Mod assist, +2 for physical assistance General bed mobility comments: Pt required assist for all aspects of bed mobility, although initiating more movement than previous session. Bed pad was used for scooting and positioning.  Transfers Overall transfer level: Needs assistance Equipment used: None Transfers: Sit to/from Stand Sit to Stand: Supervision General transfer comment: Further mobility deferred as HD was present to take pt for session.  Ambulation/Gait Ambulation/Gait assistance: Supervision Ambulation Distance (Feet): 50 Feet Assistive device:  (IV pole) Gait Pattern/deviations: Step-through pattern, Decreased stride  length Gait velocity interpretation: Below normal speed for age/gender General Gait Details: Mild unsteadiness noted during gait however no LOB. use of IV pole to simulate SPC. Ambulated within room and to/from bathroom. Dyspnea present. HR 112 bpm.    ADL:    Cognition: Cognition Overall Cognitive Status: Within Functional Limits for tasks assessed Orientation Level: Oriented to person, Oriented to place, Disoriented to time, Disoriented to situation Cognition Arousal/Alertness: Awake/alert Behavior During Therapy: WFL for tasks assessed/performed Overall Cognitive Status: Within Functional Limits for tasks assessed  Blood pressure 123/53, pulse 92, temperature 98.6 F (37 C), temperature source Oral, resp. rate 24, height _0  (1.651 m), weight 51.5 kg (113 lb 8.6 oz), last menstrual period 07/18/2012, SpO2 96 %. Physical Exam  Constitutional:  54 year old right-handed African-American female with nasogastric tube in place.  HENT:  Head: Normocephalic.  Eyes:  Pupils sluggish to light  Neck: Normal range of motion. Neck supple. No thyromegaly present.  Cardiovascular: Normal rate and regular rhythm.   Respiratory:  Decreased breath sounds at the bases  GI: Soft. Bowel sounds are normal. She exhibits no distension.  Neurological:  Patient is lethargic difficult to arouse. She will make eye contact with examiner a fall back asleep which did limit the overall exam. She did not follow commands. Occasionally he has no head nods that were inconsistent  Skin: Skin is warm and dry.  4/5 bilateral delt bi tri grip 4/5 R HF, KE, ADF, 3+ Left HF, KE, ADF Sensation difficult to assess secondary to hypophonic dysarthria grossly intact LT BUE and BLE  Results for orders placed or performed during the hospital encounter of 01/18/15 (from the past 24 hour(s))  Glucose, capillary     Status: Abnormal   Collection Time: 02/06/15 11:27 AM  Result Value Ref Range   Glucose-Capillary 147 (H)  70 - 99 mg/dL  Glucose, capillary     Status: Abnormal   Collection Time: 02/06/15  8:40 PM  Result Value Ref Range   Glucose-Capillary 143 (H) 70 - 99 mg/dL  Glucose, capillary     Status: Abnormal   Collection Time: 02/06/15 11:37 PM  Result Value Ref Range   Glucose-Capillary 134 (H) 70 - 99 mg/dL   Comment 1 Notify RN   Glucose, capillary     Status: Abnormal   Collection Time: 02/07/15  5:22 AM  Result Value Ref Range   Glucose-Capillary 139 (H) 70 - 99 mg/dL  Renal function panel (daily at 0500)     Status: Abnormal   Collection Time: 02/07/15  5:25 AM  Result Value Ref Range   Sodium 140 135 - 145  mmol/L   Potassium 3.6 3.5 - 5.1 mmol/L   Chloride 102 96 - 112 mmol/L   CO2 28 19 - 32 mmol/L   Glucose, Bld 146 (H) 70 - 99 mg/dL   BUN 13 6 - 23 mg/dL   Creatinine, Ser 2.79 (H) 0.50 - 1.10 mg/dL   Calcium 9.5 8.4 - 10.5 mg/dL   Phosphorus 2.1 (L) 2.3 - 4.6 mg/dL   Albumin 2.2 (L) 3.5 - 5.2 g/dL   GFR calc non Af Amer 18 (L) >90 mL/min   GFR calc Af Amer 21 (L) >90 mL/min   Anion gap 10 5 - 15  Glucose, capillary     Status: Abnormal   Collection Time: 02/07/15  8:06 AM  Result Value Ref Range   Glucose-Capillary 148 (H) 70 - 99 mg/dL   Dg Chest Port 1 View  02/05/2015   CLINICAL DATA:  Dialysis catheter insertion  EXAM: PORTABLE CHEST - 1 VIEW  COMPARISON:  Study obtained earlier in the day  FINDINGS: Dual-lumen catheter present with tips near the cavoatrial junction. Left jugular catheter tip is in the superior vena cava. Feeding tube tip is below the diaphragm. Endotracheal tube is been removed. No pneumothorax. No edema or consolidation. Heart is upper normal in size with pulmonary vascular within normal limits. No adenopathy.  IMPRESSION: Tube and catheter positions as described without pneumothorax. No edema or consolidation.   Electronically Signed   By: Lowella Grip III M.D.   On: 02/05/2015 13:47   Dg Fluoro Guide Cv Line-no Report  02/05/2015   CLINICAL DATA:     FLOURO GUIDE CV LINE  Fluoroscopy was utilized by the requesting physician.  No radiographic  interpretation.     Assessment/Plan: Diagnosis: deconditioning related to respiratory failure, acute on chronic renal failure in a pt with hx of brainstem infarct right paramedian pontine 1. Does the need for close, 24 hr/day medical supervision in concert with the patient's rehab needs make it unreasonable for this patient to be served in a less intensive setting? Yes 2. Co-Morbidities requiring supervision/potential complications: tachycardia, new onset HD, bowel incontinence, encephalopathy 3. Due to bowel management, safety, skin/wound care, disease management, medication administration, pain management and patient education, does the patient require 24 hr/day rehab nursing? Yes 4. Does the patient require coordinated care of a physician, rehab nurse, PT (1-2 hrs/day, 5 days/week), OT (1-2 hrs/day, 5 days/week) and SLP (.5-1 hrs/day, 5 days/week) to address physical and functional deficits in the context of the above medical diagnosis(es)? Yes Addressing deficits in the following areas: balance, endurance, locomotion, strength, transferring, bowel/bladder control, bathing, dressing, feeding, grooming, toileting, cognition, speech, language, swallowing and psychosocial support 5. Can the patient actively participate in an intensive therapy program of at least 3 hrs of therapy per day at least 5 days per week? No 6. The potential for patient to make measurable gains while on inpatient rehab is good once able to tolerate 7. Anticipated functional outcomes upon discharge from inpatient rehab are supervision and min assist  with PT, supervision and min assist with OT, supervision and min assist with SLP. 8. Estimated rehab length of stay to reach the above functional goals is: 12-16d 9. Does the patient have adequate social supports and living environment to accommodate these discharge functional goals?  Potentially 10. Anticipated D/C setting: Home 11. Anticipated post D/C treatments: [place] therapy 12. Overall Rehab/Functional Prognosis: good  RECOMMENDATIONS: This patient's condition is appropriate for continued rehabilitative care in the following setting: CIR once tachycardia is  controlled and pt tolerating PT/OT Patient has agreed to participate in recommended program. Yes Note that insurance prior authorization may be required for reimbursement for recommended care.  Comment:     02/07/2015

## 2015-02-07 NOTE — Procedures (Signed)
Objective Swallowing Evaluation: Fiberoptic Endoscopic Evaluation of Swallowing  Patient Details  Name: [name] [name] MRN: [id number] Date of Birth: [birth year]  Today's Date: 02/07/2015 Time: SLP Start Time (ACUTE ONLY): 1425-SLP Stop Time (ACUTE ONLY): 1445 SLP Time Calculation (min) (ACUTE ONLY): 20 min  Past Medical History:  Past Medical History  Diagnosis Date  . Hypertension   . Lupus   . CKD (chronic kidney disease)     due to lupus/Dr. [name]  . Diabetes mellitus   . Stenosis of cervical spine region     with HNP at C5/6, C6/7  . Metatarsal bone fracture right    4th  . 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 Arthritis     RA  . Anemia   . Stroke 11/2014    left sided weakness, dysphagia  . Pain in joints   . Paresthesias   . Hyperlipidemia   . Colitis     ? per hospital notes 11/2014  . Lower GI bleed   . Hyponatremia   . Metabolic acidosis   . Hyperplastic colon polyp   . Hemorrhoids   . Blood transfusion without reported diagnosis    Past Surgical History:  Past Surgical History  Procedure Laterality Date  . Breast biopsy    . Tubal ligation    . Insertion of dialysis catheter Right 02/05/2015    Procedure: INSERTION OF DIALYSIS CATHETER - RIGHT INTERNAL JUGULAR;  Surgeon: Conrad Cordes Lakes, MD;  Location: [facility] OR;  Service: Vascular;  Laterality: Right;   HPI:  HPI: 54 y.o. female with PMH: lupus, CKD. DM, CVA (pons discharged on regular diet and thin 11/2014), lower GI bleed admitted with with shortness of breath. Pt experienced acute hypoxic respiratory failure on 3/20, requiring intubation; extubated 3/29. She required reintubation 3/30-4/4. Bedside swallow 01/30/15 completed revealing multiple s/s aspiration , reduced hyolaryngeal excursion. FEES 10/04/14 showed penetration of thin liquids during consumption of large, consecutive boluses. Pt with adequate sensation, and spontaneously cleared throat, which effectively  expelled penetrate.CXR continued improvement in left perihilar airspace opacification. FEES recommended following BSE.  No Data Recorded  Assessment / Plan / Recommendation CHL IP CLINICAL IMPRESSIONS 02/07/2015  Dysphagia Diagnosis Moderate pharyngeal phase dysphagia  Clinical impression Pt exhibited moderate sensorimotor based pharyngeal dysphagia with decreased sensation and lingual/laryngeal weakness leading to delayed swallow initiation, pharyngeal residue, frank laryngeal penetration and trace aspiration (nectar and honey) with ineffective cough. Pt demonstrates decreased respiratory support with weak volitional cough unable to clear penetrates. Recommend cotninue NPO with PANDA; trials puree only with SLP next date for possible initiation of purees.       CHL IP TREATMENT RECOMMENDATION 02/07/2015  Treatment Plan Recommendations Therapy as outlined in treatment plan below     CHL IP DIET RECOMMENDATION 02/07/2015  Diet Recommendations NPO  Liquid Administration via (None)  Medication Administration Via alternative means  Compensations (None)  Postural Changes and/or Swallow Maneuvers (None)     CHL IP OTHER RECOMMENDATIONS 02/07/2015  Recommended Consults (None)  Oral Care Recommendations Oral care BID  Other Recommendations (None)     CHL IP FOLLOW UP RECOMMENDATIONS 02/07/2015  Follow up Recommendations Skilled Nursing facility     [facility] IP FREQUENCY AND DURATION 02/07/2015  Speech Therapy Frequency (ACUTE ONLY) min 2x/week  Treatment Duration 2 weeks     Pertinent Vitals/Pain none    SLP Swallow Goals No flowsheet data found.  No flowsheet data found.  CHL IP REASON FOR REFERRAL 02/07/2015  Reason for Referral Objectively evaluate swallowing function     CHL IP ORAL PHASE 02/07/2015  Lips (None)  Tongue (None)  Mucous membranes (None)  Nutritional status (None)  Other (None)  Oxygen therapy (None)  Oral Phase WFL  Oral - Pudding Teaspoon (None)  Oral - Pudding Cup (None)   Oral - Honey Teaspoon (None)  Oral - Honey Cup (None)  Oral - Honey Syringe (None)  Oral - Nectar Teaspoon (None)  Oral - Nectar Cup (None)  Oral - Nectar Straw (None)  Oral - Nectar Syringe (None)  Oral - Ice Chips (None)  Oral - Thin Teaspoon (None)  Oral - Thin Cup (None)  Oral - Thin Straw (None)  Oral - Thin Syringe (None)  Oral - Puree (None)  Oral - Mechanical Soft (None)  Oral - Regular (None)  Oral - Multi-consistency (None)  Oral - Pill (None)  Oral Phase - Comment (None)      CHL IP PHARYNGEAL PHASE 02/07/2015  Pharyngeal Phase Impaired  Pharyngeal - Pudding Teaspoon (None)  Penetration/Aspiration details (pudding teaspoon) (None)  Pharyngeal - Pudding Cup (None)  Penetration/Aspiration details (pudding cup) (None)  Pharyngeal - Honey Teaspoon Delayed swallow initiation;Premature spillage to valleculae;Pharyngeal residue - valleculae;Pharyngeal residue - pyriform sinuses;Reduced tongue base retraction;Reduced laryngeal elevation;Penetration/Aspiration during swallow  Penetration/Aspiration details (honey teaspoon) Material enters airway, remains ABOVE vocal cords and not ejected out  Pharyngeal - Honey Cup Delayed swallow initiation;Premature spillage to pyriform sinuses;Pharyngeal residue - valleculae;Pharyngeal residue - pyriform sinuses;Reduced tongue base retraction;Reduced laryngeal elevation;Penetration/Aspiration during swallow  Penetration/Aspiration details (honey cup) Material enters airway, remains ABOVE vocal cords and not ejected out  Pharyngeal - Honey Syringe (None)  Penetration/Aspiration details (honey syringe) (None)  Pharyngeal - Nectar Teaspoon Delayed swallow initiation;Premature spillage to pyriform sinuses;Pharyngeal residue - valleculae;Pharyngeal residue - pyriform sinuses;Reduced tongue base retraction;Reduced laryngeal elevation;Penetration/Aspiration during swallow  Penetration/Aspiration details (nectar teaspoon) Material enters airway,  CONTACTS cords and not ejected out;Material enters airway, passes BELOW cords and not ejected out despite cough attempt by patient  Pharyngeal - Nectar Cup (None)  Penetration/Aspiration details (nectar cup) (None)  Pharyngeal - Nectar Straw (None)  Penetration/Aspiration details (nectar straw) (None)  Pharyngeal - Nectar Syringe (None)  Penetration/Aspiration details (nectar syringe) (None)  Pharyngeal - Ice Chips (None)  Penetration/Aspiration details (ice chips) (None)  Pharyngeal - Thin Teaspoon (None)  Penetration/Aspiration details (thin teaspoon) (None)  Pharyngeal - Thin Cup (None)  Penetration/Aspiration details (thin cup) (None)  Pharyngeal - Thin Straw (None)  Penetration/Aspiration details (thin straw) (None)  Pharyngeal - Thin Syringe (None)  Penetration/Aspiration details (thin syringe') (None)  Pharyngeal - Puree Premature spillage to valleculae;Delayed swallow initiation;Pharyngeal residue - valleculae;Reduced tongue base retraction  Penetration/Aspiration details (puree) (None)  Pharyngeal - Mechanical Soft (None)  Penetration/Aspiration details (mechanical soft) (None)  Pharyngeal - Regular (None)  Penetration/Aspiration details (regular) (None)  Pharyngeal - Multi-consistency (None)  Penetration/Aspiration details (multi-consistency) (None)  Pharyngeal - Pill (None)  Penetration/Aspiration details (pill) (None)  Pharyngeal Comment (None)     CHL IP CERVICAL ESOPHAGEAL PHASE 02/07/2015  Cervical Esophageal Phase WFL  Pudding Teaspoon (None)  Pudding Cup (None)  Honey Teaspoon (None)  Honey Cup (None)  Honey Syringe (None)  Nectar Teaspoon (None)  Nectar Cup (None)  Nectar Straw (None)  Nectar Syringe (None)  Thin Teaspoon (None)  Thin Cup (None)  Thin Straw (None)  Thin Syringe (None)  Cervical Esophageal Comment (None)    No flowsheet data found.  [name] 02/07/2015, 3:29 PM   Orbie Pyo [place]  [phone]

## 2015-02-07 NOTE — Progress Notes (Signed)
Speech Language Pathology T Patient Details Name: MAKINA SKOW MRN: [id number] DOB: 05-21-1961 Today's Date: 02/07/2015 Time:  -      FEES today at 1300.    Orbie Pyo [place] [phone]

## 2015-02-07 NOTE — Progress Notes (Signed)
Attempted to call report to nurse on 6E.  Waiting for call back.

## 2015-02-07 NOTE — Progress Notes (Signed)
S: Denies pain.  No SOB.  Tolerated HD yest OK O:BP 152/67 mmHg  Pulse 109  Temp(Src) 98.7 F (37.1 C) (Oral)  Resp 18  Wt 51.5 kg (113 lb 8.6 oz)  SpO2 96%  LMP 07/18/2012  Intake/Output Summary (Last 24 hours) at 02/07/15 0737 Last data filed at 02/07/15 0600  Gross per 24 hour  Intake   1820 ml  Output   1928 ml  Net   -108 ml   Weight change: 2.2 kg (4 lb 13.6 oz) Gen: Awake and alert CVS: RRR Resp: Clear Abd: + BS NTND No HSM Ext: No edema NEURO: Awake and alert but not very vocal.  Follows simple commands Rt IJ permcath.  Lt IJ triple lumen   . antiseptic oral rinse  7 mL Mouth Rinse q12n4p  . chlorhexidine  15 mL Mouth Rinse BID  . darbepoetin (ARANESP) injection - DIALYSIS  150 mcg Intravenous Q Tue-HD  . folic acid  1 mg Oral Daily  . heparin subcutaneous  5,000 Units Subcutaneous 3 times per day  . hydroxychloroquine  400 mg Oral Daily  . labetalol  200 mg Per Tube 3 times per day  . multivitamin  1 tablet Oral QHS  . mycophenolate (CELLCEPT) IV  1,000 mg Intravenous Q12H  . pantoprazole sodium  40 mg Per Tube QHS  . thiamine  100 mg Oral Daily   Dg Chest Port 1 View  02/05/2015   CLINICAL DATA:  Dialysis catheter insertion  EXAM: PORTABLE CHEST - 1 VIEW  COMPARISON:  Study obtained earlier in the day  FINDINGS: Dual-lumen catheter present with tips near the cavoatrial junction. Left jugular catheter tip is in the superior vena cava. Feeding tube tip is below the diaphragm. Endotracheal tube is been removed. No pneumothorax. No edema or consolidation. Heart is upper normal in size with pulmonary vascular within normal limits. No adenopathy.  IMPRESSION: Tube and catheter positions as described without pneumothorax. No edema or consolidation.   Electronically Signed   By: Lowella Grip III M.D.   On: 02/05/2015 13:47   Dg Fluoro Guide Cv Line-no Report  02/05/2015   CLINICAL DATA:    FLOURO GUIDE CV LINE  Fluoroscopy was utilized by the requesting physician.  No  radiographic  interpretation.    BMET    Component Value Date/Time   NA 140 02/07/2015 0525   K 3.6 02/07/2015 0525   CL 102 02/07/2015 0525   CO2 28 02/07/2015 0525   GLUCOSE 146* 02/07/2015 0525   BUN 13 02/07/2015 0525   CREATININE 2.79* 02/07/2015 0525   CREATININE 3.49* 11/10/2014 1822   CREATININE 0.35* 10/23/2008 2300   CALCIUM 9.5 02/07/2015 0525   CALCIUM 8.9 10/10/2014 1931   GFRNONAA 18* 02/07/2015 0525   GFRAA 21* 02/07/2015 0525   CBC    Component Value Date/Time   WBC 6.4 02/06/2015 0447   WBC 5.5 03/01/2012 1128   RBC 2.79* 02/06/2015 0447   RBC 3.56* 03/01/2012 1128   HGB 7.7* 02/06/2015 0447   HGB 9.0* 03/01/2012 1128   HCT 24.9* 02/06/2015 0447   HCT 29.3* 03/01/2012 1128   PLT 357 02/06/2015 0447   MCV 89.2 02/06/2015 0447   MCV 82.4 03/01/2012 1128   MCH 27.6 02/06/2015 0447   MCH 25.3* 03/01/2012 1128   MCHC 30.9 02/06/2015 0447   MCHC 30.7* 03/01/2012 1128   RDW 17.3* 02/06/2015 0447   LYMPHSABS 0.6* 11/18/2014 2324   MONOABS 0.4 11/18/2014 2324   EOSABS 0.0 11/18/2014 2324  BASOSABS 0.0 11/18/2014 2324     Assessment: 1. 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 Anemia on aranesp 3. Sec HPTH, Start sensipar when able to take PO meds 4. SLE  Plan: 1. HD tomorrow 2.  She will need AVF when more stable 3. Will decrease dose of cellcept to 500mg  BID   [name] [name] T

## 2015-02-07 NOTE — Progress Notes (Signed)
02/07/2015 6:49 PM  Pt transferred from 72M to 6E11.  Pt fully alert and oriented, with flat affect.  Vitals stable.  Full assessment to EPIC.  Rectal tube in place; telemetry box 11 applied--confirmed with CCMD; panda to left nare in place with feed running at goal (55cc/hr).  Denies pain.  See doc flowsheets for skin assessment specifics; in brief, patient has three areas to her buttocks/sacrum.  A healed stage two to the left lower buttocks, a stage two that is healing to the left buttocks, and a stage two that's healing to her sacrum.  Pt also has an area of blancheabl redness measuring approximately 1*1cm to her right posterior hip/thigh with foam intact.  Sacral foam intact.  Pt also has what appears to be a device related wound to her upper lip from ET tube.  WOC consult placed for further assessment.  Pt oriented to room/unit, and was instructed on how to utilize the call bell, to which she verbalized understanding.  Bed alarm on.  Will monitor. Princella Pellegrini

## 2015-02-07 NOTE — Progress Notes (Signed)
NUTRITION FOLLOW UP  Pt meets criteria for SEVERE MALNUTRITION in the context of ACUTE ILLNESS/INJURY as evidenced by 11% weight loss in 2 weeks and severe muscle wasting.  Intervention:   Continue TF via NGT with Vital  AF 1.2, increase goal rate to 55 ml/h (1320 ml per day) to provide 1584 kcals, 99 gm protein, 1071 ml free water daily.  Nutrition Dx:   Inadequate oral intake related to inability to eat as evidenced by NPO status; ongoing  Goal:   Intake to meet >90% of estimated nutrition needs; met  Monitor:   Swallowing ability, diet advancement, TF tolerance/adequacy, weight trend, labs.  Assessment:   Patient admitted with weakness and recent weight loss. History of lupus, CKD, DM. Patient with frequent diarrhea within 45 minutes after eating since starting Cellcept in November. She has lost 20 lbs in the past 3-4 months.  Patient was extubated on 4/4. S/P FEES with SLP today, per RN, SLP to return tomorrow for further evaluation. Still with some aspiration today. NGT is in place. Patient is currently receiving Vital AF 1.2 at 50 ml/h (1200 ml/day) to provide 1440 kcals, 90 gm protein, 973 ml free water daily.   Labs: elevated creatinine, low phosphorus  Height: Ht Readings from Last 1 Encounters:  02/07/15 _0  (1.651 m)    Weight Status:   Wt Readings from Last 1 Encounters:  02/07/15 113 lb 8.6 oz (51.5 kg)   01/24/15 134 lb 14.7 oz (61.2 kg)   01/20/15 132 lb 11.2 oz (60.192 kg)   01/19/15 124 lb 11.2 oz (56.564 kg)               Re-estimated needs:  Kcal: 1450-1650 Protein: 85-100 grams Fluid: 1.8 L/day  Skin: stage II pressure ulcer on buttocks  Diet Order: Diet NPO time specified Except for: Other (See Comments)   Intake/Output Summary (Last 24 hours) at 02/07/15 1433 Last data filed at 02/07/15 1129  Gross per 24 hour  Intake   1490 ml  Output   2003 ml  Net   -513 ml    Last BM: 4/6   Labs:   Recent Labs Lab  02/03/15 2305 02/04/15 0435 02/05/15 0230 02/06/15 0447 02/07/15 0525  NA  --  138 138 140 140  K  --  3.8 3.8 3.7 3.6  CL  --  100 103 106 102  CO2  --  _1 BUN  --  31* 43* 48* 13  CREATININE  --  4.12* 5.38* 6.59* 2.79*  CALCIUM  --  9.5 9.4 9.5 9.5  MG 2.0 2.0 2.0  --   --   PHOS 3.4 3.4  3.4 3.3  3.3 4.0 2.1*  GLUCOSE  --  154* 149* 110* 146*    CBG (last 3)   Recent Labs  02/07/15 0522 02/07/15 0806 02/07/15 1202  GLUCAP 139* 148* 150*    Scheduled Meds: . antiseptic oral rinse  7 mL Mouth Rinse q12n4p  . chlorhexidine  15 mL Mouth Rinse BID  . darbepoetin (ARANESP) injection - DIALYSIS  150 mcg Intravenous Q Tue-HD  . folic acid  1 mg Oral Daily  . heparin subcutaneous  5,000 Units Subcutaneous 3 times per day  . hydroxychloroquine  400 mg Oral Daily  . labetalol  200 mg Per Tube 3 times per day  . multivitamin  1 tablet Oral QHS  . mycophenolate (CELLCEPT) IV  500 mg Intravenous Q12H  . pantoprazole sodium  40 mg  Per Tube QHS  . thiamine  100 mg Oral Daily    Continuous Infusions: . feeding supplement (VITAL AF 1.2 CAL) 1,000 mL (02/06/15 2129)    Molli Barrows, [place], LDN, [place] Pager [phone] After Hours Pager [phone]

## 2015-02-07 NOTE — Progress Notes (Signed)
[place] [facility] TEAM 1 - Stepdown/ICU TEAM Progress Note  [name] [name] JIR:[id number] DOB: 10/12/1961 DOA: 01/18/2015 PCP: Lamar Blinks, MD  Admit HPI / Brief Narrative: 54 y/o female with lupus (on cellcept, plaquenil) and associated CKD, DM, and HTN who presented 3/17 with vague c/o SOB, diarrhea, malaise, weakness, and cough. She was admitted by [facility] and r/o for flu, and was undergoing further w/u for diarrhea, mildly elevated troponin and anemia. Overnight 3/19 she developed intermittent SVT, then persistent tachycardia with HR 150's and worsening SOB and hypoxia requiring NRB. PCCM consulted 3/20. She required ETT/MV 3/20. CXR with B infiltrates. Initiated CVVHD on 3/21 pm.   Significant Events: 3/19  TTE BP10-25%, grade 1 diastolic dysfunction, mod AR, mod MR, mod TR. No pericardial effusion 3/20  venous duplex B LE - negative 3/20  intubated   3/21 Adenosine challenge > sinus rhythm with some non-conducted p-waves 3/23 CVVHD, I/O even last 24 h, tachycardia improved, fent + propofol 3/24 high residuals, on max dose fentanyl. Rx'd with Reglan  3/26 continued agitation with WUA / SBT, changed back to propofol/fentanyl  3/28  seroquel trance, neuro consult 3/29  more awake, follows commands - extubated  3/30  reintubated, bronchoscopy - for LLL atx 4/03  fentanyl drip stopped 4/04  tunneled cath placed 4/04 extubated   HPI/Subjective: Pt is somewhat somnolent, but does respond to simple questions.  She appears quite deconditioned.  She denies acute pain presently.  She has a rectal tube in place, and a L chest HD cath.  She is not in acute resp distress.    Assessment/Plan:  Acute hypoxic respiratory failure - acute pulmonary edema in setting HTN and renal failure infiltrates have completely cleared -sats 96% on RA - resolved   SVT/sinus tachycardia HR 150 at time of her acute decompensation, Adenosine given, appeared to be sinus with some non-conducted p-waves -  presumed reactive to critical illness - PE was questioned butBLE venous dopplers negative; doubt PE (heparin gtt stopped 3/23) - still having intermittent tachy, but not as severe - TSH normal - will check AM cortisol   Hypotension BP has now normalized   Newly diagnosed Diastolic CHF EF 85-27% with MOD aortic, tricuspid and mitral regurg - volume management per HD   Acute on chronic renal failure, presume lupus nephritis As per Nephrology - ongoing HD - plan for AVF as improves further   SLE continue Cellcept and Plaq  Anemia s/p 1 unit PRBC 3/22 - Hgb presently stable   Hyponatremia  Corrected w/ HD   Metabolic acidosis Corrected w/ HD   Diarrhea / Abdom pain  Initially resolved - returned with reglan 3/26 > flexi seal placed - neg abd xray - follow   Acute encephalopathy - fluctuating Head Ct neg 3/27 - EEG 7/82 metabolic enceph - MRI 4/23 > Remote RIGHT inferior brainstem hemorrhage, stable RIGHT basal ganglia micro hemorrhage, Remote small RIGHT inferior cerebellar infarct - CSF 3/31 neg for lupus cerebri - Neuro signed off on 4/3  Protein Calorie Malnutrition  Tube feeds presently - for FEES today   Code Status: FULL Family Communication: no family present at time of exam Disposition Plan: SDU - PT suggests CIR   Consultants: Nephrology Neurology   Antibiotics: Vanc 3/20 > 3/22 + 3/30 > 4/2 Zosyn 3/20 > 3/28 Cefepime 3/30 > 4/3   DVT prophylaxis: SQ heparin  Objective: Blood pressure 152/67, pulse 109, temperature 98.6 F (37 C), temperature source Oral, resp. rate 18, height 5\' 5"  (1.651 m), weight  51.5 kg (113 lb 8.6 oz), last menstrual period 07/18/2012, SpO2 96 %.  Intake/Output Summary (Last 24 hours) at 02/07/15 1610 Last data filed at 02/07/15 0600  Gross per 24 hour  Intake   1760 ml  Output   1928 ml  Net   -168 ml   Exam: General: No acute respiratory distress Lungs: Clear to auscultation bilaterally without wheezes or  crackles Cardiovascular: Mild tachy at 100bpm - reg rhythm without murmur gallop or rub normal S1 and S2 Abdomen: Nontender, nondistended, soft, bowel sounds positive, no rebound, no ascites, no appreciable mass Extremities: No significant cyanosis, clubbing, or edema bilateral lower extremities  Data Reviewed: Basic Metabolic Panel:  Recent Labs Lab 02/03/15 0001 02/03/15 0500 02/03/15 1105 02/03/15 2305 02/04/15 0435 02/05/15 0230 02/06/15 0447 02/07/15 0525  NA  --  143  --   --  138 138 140 140  K  --  3.7  --   --  3.8 3.8 3.7 3.6  CL  --  110  --   --  100 103 106 102  CO2  --  23  --   --  23 23 24 28   GLUCOSE  --  128*  --   --  154* 149* 110* 146*  BUN  --  69*  --   --  31* 43* 48* 13  CREATININE  --  7.47*  --   --  4.12* 5.38* 6.59* 2.79*  CALCIUM  --  9.8  --   --  9.5 9.4 9.5 9.5  MG 2.7*  --  1.9 2.0 2.0 2.0  --   --   PHOS 5.5* 5.6* <1.0* 3.4 3.4  3.4 3.3  3.3 4.0 2.1*    Liver Function Tests:  Recent Labs Lab 02/03/15 0500 02/04/15 0435 02/05/15 0230 02/06/15 0447 02/07/15 0525  ALBUMIN 2.0* 2.2* 2.0* 2.0* 2.2*   Coags:  Recent Labs Lab 02/02/15 0245  INR 1.29    Recent Labs Lab 02/02/15 0245  APTT 46*    CBC:  Recent Labs Lab 02/02/15 0245 02/03/15 0500 02/04/15 0500 02/05/15 0230 02/06/15 0447  WBC 14.2* 8.8 7.1 6.8 6.4  HGB 8.5* 8.5* 7.9* 7.5* 7.7*  HCT 27.9* 27.0* 26.1* 24.0* 24.9*  MCV 90.9 91.2 90.9 89.9 89.2  PLT 389 377 371 353 357   CBG:  Recent Labs Lab 02/06/15 0736 02/06/15 1127 02/06/15 2040 02/06/15 2337 02/07/15 0522  GLUCAP 134* 147* 143* 134* 139*    Recent Results (from the past 240 hour(s))  Culture, bal-quantitative     Status: None   Collection Time: 01/31/15  3:11 PM  Result Value Ref Range Status   Specimen Description BRONCHIAL ALVEOLAR LAVAGE  Final   Special Requests Immunocompromised  Final   Gram Stain   Final    FEW WBC PRESENT,BOTH PMN AND MONONUCLEAR RARE SQUAMOUS EPITHELIAL  CELLS PRESENT NO ORGANISMS SEEN Performed at [facility]   Final    20,OOO COLONIES/ML Performed at [facility]    Culture   Final    CANDIDA ALBICANS Performed at [facility]    Report Status 02/03/2015 FINAL  Final  CSF culture     Status: None   Collection Time: 02/01/15 12:54 PM  Result Value Ref Range Status   Specimen Description CSF  Final   Special Requests Immunocompromised  Final   Gram Stain   Final    CYTOSPIN SLIDE WBC PRESENT, PREDOMINANTLY MONONUCLEAR NO ORGANISMS SEEN Performed  at [facility] Performed at [facility]    Culture   Final    NO GROWTH 3 DAYS Performed at [facility]    Report Status 02/05/2015 FINAL  Final  Gram stain - STAT with CSF culture     Status: None   Collection Time: 02/01/15 12:54 PM  Result Value Ref Range Status   Specimen Description CSF  Final   Special Requests Immunocompromised  Final   Gram Stain   Final    WBC PRESENT, PREDOMINANTLY MONONUCLEAR NO ORGANISMS SEEN CSF CYTOSPIN    Report Status 02/01/2015 FINAL  Final     Studies:   Recent x-ray studies have been reviewed in detail by the Attending Physician  Scheduled Meds:  Scheduled Meds: . antiseptic oral rinse  7 mL Mouth Rinse q12n4p  . chlorhexidine  15 mL Mouth Rinse BID  . darbepoetin (ARANESP) injection - DIALYSIS  150 mcg Intravenous Q Tue-HD  . folic acid  1 mg Oral Daily  . heparin subcutaneous  5,000 Units Subcutaneous 3 times per day  . hydroxychloroquine  400 mg Oral Daily  . labetalol  200 mg Per Tube 3 times per day  . multivitamin  1 tablet Oral QHS  . mycophenolate (CELLCEPT) IV  500 mg Intravenous Q12H  . pantoprazole sodium  40 mg Per Tube QHS  . thiamine  100 mg Oral Daily    Time spent on care of this patient: 35 mins   MCCLUNG,JEFFREY T , MD   [facility] Office  [phone] Pager - Text Page per Shea Evans as per below:  On-Call/Text Page:       [email or web address]      password [email or web address]  If 7PM-7AM, please contact night-coverage [email or web address] Password [email or web address] 02/07/2015, 8:22 AM   LOS: 19 days

## 2015-02-07 NOTE — Progress Notes (Signed)
Called pts sister to inform her that pt will move to another room

## 2015-02-08 DIAGNOSIS — N179 Acute kidney failure, unspecified: Secondary | ICD-10-CM

## 2015-02-08 DIAGNOSIS — I471 Supraventricular tachycardia, unspecified: Secondary | ICD-10-CM | POA: Insufficient documentation

## 2015-02-08 DIAGNOSIS — I5031 Acute diastolic (congestive) heart failure: Secondary | ICD-10-CM | POA: Insufficient documentation

## 2015-02-08 DIAGNOSIS — N189 Chronic kidney disease, unspecified: Secondary | ICD-10-CM

## 2015-02-08 DIAGNOSIS — E871 Hypo-osmolality and hyponatremia: Secondary | ICD-10-CM

## 2015-02-08 DIAGNOSIS — J9601 Acute respiratory failure with hypoxia: Secondary | ICD-10-CM | POA: Insufficient documentation

## 2015-02-08 DIAGNOSIS — I9589 Other hypotension: Secondary | ICD-10-CM | POA: Insufficient documentation

## 2015-02-08 DIAGNOSIS — E46 Unspecified protein-calorie malnutrition: Secondary | ICD-10-CM | POA: Insufficient documentation

## 2015-02-08 LAB — RENAL FUNCTION PANEL
ALBUMIN: 2.3 g/dL — AB (ref 3.5–5.2)
ANION GAP: 13 (ref 5–15)
BUN: 30 mg/dL — ABNORMAL HIGH (ref 6–23)
CO2: 24 mmol/L (ref 19–32)
Calcium: 9.8 mg/dL (ref 8.4–10.5)
Chloride: 101 mmol/L (ref 96–112)
Creatinine, Ser: 4.52 mg/dL — ABNORMAL HIGH (ref 0.50–1.10)
GFR calc Af Amer: 12 mL/min — ABNORMAL LOW (ref >90)
GFR calc non Af Amer: 10 mL/min — ABNORMAL LOW (ref >90)
Glucose, Bld: 152 mg/dL — ABNORMAL HIGH (ref 70–99)
PHOSPHORUS: 2.3 mg/dL (ref 2.3–4.6)
POTASSIUM: 3.9 mmol/L (ref 3.5–5.1)
Sodium: 138 mmol/L (ref 135–145)

## 2015-02-08 LAB — CBC
HCT: 25.9 % — ABNORMAL LOW (ref 36.0–46.0)
Hemoglobin: 7.9 g/dL — ABNORMAL LOW (ref 12.0–15.0)
MCH: 27.8 pg (ref 26.0–34.0)
MCHC: 30.5 g/dL (ref 30.0–36.0)
MCV: 91.2 fL (ref 78.0–100.0)
Platelets: 326 10*3/uL (ref 150–400)
RBC: 2.84 MIL/uL — ABNORMAL LOW (ref 3.87–5.11)
RDW: 17.6 % — AB (ref 11.5–15.5)
WBC: 6.9 10*3/uL (ref 4.0–10.5)

## 2015-02-08 LAB — GLUCOSE, CAPILLARY
GLUCOSE-CAPILLARY: 119 mg/dL — AB (ref 70–99)
GLUCOSE-CAPILLARY: 125 mg/dL — AB (ref 70–99)
Glucose-Capillary: 118 mg/dL — ABNORMAL HIGH (ref 70–99)
Glucose-Capillary: 123 mg/dL — ABNORMAL HIGH (ref 70–99)
Glucose-Capillary: 130 mg/dL — ABNORMAL HIGH (ref 70–99)
Glucose-Capillary: 133 mg/dL — ABNORMAL HIGH (ref 70–99)

## 2015-02-08 MED ORDER — BACITRACIN-NEOMYCIN-POLYMYXIN OINTMENT TUBE
TOPICAL_OINTMENT | Freq: Three times a day (TID) | CUTANEOUS | Status: DC
  Administered 2015-02-08 – 2015-02-09 (×4): via TOPICAL
  Administered 2015-02-09: 1 via TOPICAL
  Administered 2015-02-09 – 2015-02-10 (×3): via TOPICAL
  Administered 2015-02-10: 1 via TOPICAL
  Administered 2015-02-11 (×2): via TOPICAL
  Administered 2015-02-11: 1 via TOPICAL
  Administered 2015-02-12: 16:00:00 via TOPICAL
  Administered 2015-02-12: 1 via TOPICAL
  Administered 2015-02-13 (×3): via TOPICAL
  Administered 2015-02-14 (×2): 1 via TOPICAL
  Administered 2015-02-15 (×2): via TOPICAL
  Filled 2015-02-08 (×2): qty 15

## 2015-02-08 MED ORDER — MYCOPHENOLATE 200 MG/ML ORAL SUSPENSION
500.0000 mg | Freq: Two times a day (BID) | ORAL | Status: DC
  Administered 2015-02-08 – 2015-02-15 (×13): 500 mg via ORAL
  Filled 2015-02-08 (×19): qty 10

## 2015-02-08 MED ORDER — LABETALOL HCL 200 MG PO TABS
250.0000 mg | ORAL_TABLET | Freq: Three times a day (TID) | ORAL | Status: DC
  Administered 2015-02-08 – 2015-02-15 (×19): 250 mg
  Filled 2015-02-08 (×23): qty 0.5

## 2015-02-08 NOTE — Progress Notes (Signed)
I met with pt and her sister at bedside. Pt previously admitted to inpt rehab 12/15 and went home with intermittent assist of family. I discussed that we were assessing inpt rehab venue vs SNF depending on needed assistance that we now recommend 24/7 supervision at home due to her debility. Sister will discuss with pt's children and I will follow up tomorrow. [phone]

## 2015-02-08 NOTE — Progress Notes (Signed)
Attempted to see pt but pt at dialysis.  Will attempt again as schedule allows. Jinger Neighbors, [place] [phone]

## 2015-02-08 NOTE — Progress Notes (Signed)
[place] TEAM 1 - Stepdown/ICU TEAM Progress Note  [name] [name] QPR:[id number] DOB: [birth year] DOA: 01/18/2015 PCP: Lamar Blinks, MD  Admit HPI / Brief Narrative: 54 y/o female with lupus (on cellcept, plaquenil) and associated CKD, DM, and HTN who presented 3/17 with vague c/o SOB, diarrhea, malaise, weakness, and cough. She was admitted by [facility] and r/o for flu, and was undergoing further w/u for diarrhea, mildly elevated troponin and anemia. Overnight 3/19 she developed intermittent SVT, then persistent tachycardia with HR 150's and worsening SOB and hypoxia requiring NRB. PCCM consulted 3/20. She required ETT/MV 3/20. CXR with B infiltrates. Initiated CVVHD on 3/21 pm.   HPI/Subjective: 4/7 Alert  Assessment/Plan: Acute hypoxic respiratory failure - acute pulmonary edema in setting HTN and renal failure infiltrates have completely cleared -sats 96% on RA - resolved   SVT/sinus tachycardia HR 150 at time of her acute decompensation, Adenosine given, appeared to be sinus with some non-conducted p-waves - presumed reactive to critical illness - PE was questioned butBLE venous dopplers negative; doubt PE (heparin gtt stopped 3/23) - still having intermittent tachy, but not as severe - TSH normal - will check AM cortisol   Hypotension BP has now normalized   Newly diagnosed Diastolic CHF -EF 99-35% with MOD aortic, tricuspid and mitral regurg - volume management per HD  -Inc Labetalol to 250 mg TID  Acute on chronic renal failure, presume lupus nephritis -As per Nephrology - ongoing HD - plan for AVF as improves further   SLE -continue Cellcept and Plaq  Anemia -s/p 1 unit PRBC 3/22 - Hgb presently stable   Hyponatremia  -Corrected w/ HD   Metabolic acidosis -Corrected w/ HD   Diarrhea / Abdom pain  Initially resolved - returned with reglan 3/26 > flexi seal placed - neg abd xray - follow   Acute encephalopathy - fluctuating -Head Ct neg 3/27 - EEG 7/01  metabolic enceph - MRI 7/79 > Remote RIGHT inferior brainstem hemorrhage, stable RIGHT basal ganglia micro hemorrhage, Remote small RIGHT inferior cerebellar infarct - CSF 3/31 neg for lupus cerebri - Neuro signed off on 4/3  Protein Calorie Malnutrition  Tube feeds presently - for FEES today     Code Status: FULL Family Communication: no family present at time of exam Disposition Plan: CIR?    Consultants: Dr Fleet Contras. (Nephrology) Barry Dienes (Neurology) Dr.David B Simonds [facility] Jerilynn Mages)   Dr.Brian Starlyn Skeans (vascular surgery)   Procedure/Significant Events: 3/19 TTE TJ03-00%, grade 1 diastolic dysfunction, mod AR, mod MR, mod TR. No pericardial effusion 3/20 venous duplex B LE - negative 3/20 intubated  3/21 Adenosine challenge > sinus rhythm with some non-conducted p-waves 3/23 CVVHD, I/O even last 24 h, tachycardia improved, fent + propofol 3/24 high residuals, on max dose fentanyl. Rx'd with Reglan  3/26 continued agitation with WUA / SBT, changed back to propofol/fentanyl  3/28 seroquel trance, neuro consult 3/29 more awake, follows commands - extubated  3/30 reintubated, bronchoscopy - for LLL atx 4/03 fentanyl drip stopped 4/04 tunneled cath placed 4/04 extubated    Culture   Antibiotics:   DVT prophylaxis: Heparin SQ   Devices    LINES / TUBES:      Continuous Infusions: . feeding supplement (VITAL AF 1.2 CAL) 1,000 mL (02/07/15 2138)    Objective: VITAL SIGNS: Temp: 98.8 F (37.1 C) (04/07 1702) Temp Source: Oral (04/07 1702) BP: 140/65 mmHg (04/07 1702) Pulse Rate: 110 (04/07 1702) SPO2; FIO2:   Intake/Output Summary (Last 24 hours) at 02/08/15 1935  Last data filed at 02/08/15 1600  Gross per 24 hour  Intake      0 ml  Output   1000 ml  Net  -1000 ml     Exam: General: No acute respiratory distress Lungs: Clear to auscultation bilaterally without wheezes or crackles Cardiovascular: Regular rate and  rhythm without murmur gallop or rub normal S1 and S2 Abdomen: Nontender, nondistended, soft, bowel sounds positive, no rebound, no ascites, no appreciable mass Extremities: No significant cyanosis, clubbing, or edema bilateral lower extremities  Data Reviewed: Basic Metabolic Panel:  Recent Labs Lab 02/03/15 0001  02/03/15 1105 02/03/15 2305 02/04/15 0435 02/05/15 0230 02/06/15 0447 02/07/15 0525 02/08/15 1020  NA  --   < >  --   --  138 138 140 140 138  K  --   < >  --   --  3.8 3.8 3.7 3.6 3.9  CL  --   < >  --   --  100 103 106 102 101  CO2  --   < >  --   --  23 23 24 28 24   GLUCOSE  --   < >  --   --  154* 149* 110* 146* 152*  BUN  --   < >  --   --  31* 43* 48* 13 30*  CREATININE  --   < >  --   --  4.12* 5.38* 6.59* 2.79* 4.52*  CALCIUM  --   < >  --   --  9.5 9.4 9.5 9.5 9.8  MG 2.7*  --  1.9 2.0 2.0 2.0  --   --   --   PHOS 5.5*  < > <1.0* 3.4 3.4  3.4 3.3  3.3 4.0 2.1* 2.3  < > = values in this interval not displayed. Liver Function Tests:  Recent Labs Lab 02/04/15 0435 02/05/15 0230 02/06/15 0447 02/07/15 0525 02/08/15 1020  ALBUMIN 2.2* 2.0* 2.0* 2.2* 2.3*   No results for input(s): LIPASE, AMYLASE in the last 168 hours. No results for input(s): AMMONIA in the last 168 hours. CBC:  Recent Labs Lab 02/03/15 0500 02/04/15 0500 02/05/15 0230 02/06/15 0447 02/08/15 1020  WBC 8.8 7.1 6.8 6.4 6.9  HGB 8.5* 7.9* 7.5* 7.7* 7.9*  HCT 27.0* 26.1* 24.0* 24.9* 25.9*  MCV 91.2 90.9 89.9 89.2 91.2  PLT 377 371 353 357 326   Cardiac Enzymes: No results for input(s): CKTOTAL, CKMB, CKMBINDEX, TROPONINI in the last 168 hours. BNP (last 3 results) No results for input(s): BNP in the last 8760 hours.  ProBNP (last 3 results)  Recent Labs  10/05/14 0521  PROBNP 20673.0*    CBG:  Recent Labs Lab 02/08/15 0024 02/08/15 0355 02/08/15 0746 02/08/15 1139 02/08/15 1702  GLUCAP 118* 133* 130* 123* 119*    Recent Results (from the past 240 hour(s))    Culture, bal-quantitative     Status: None   Collection Time: 01/31/15  3:11 PM  Result Value Ref Range Status   Specimen Description BRONCHIAL ALVEOLAR LAVAGE  Final   Special Requests Immunocompromised  Final   Gram Stain   Final    FEW WBC PRESENT,BOTH PMN AND MONONUCLEAR RARE SQUAMOUS EPITHELIAL CELLS PRESENT NO ORGANISMS SEEN Performed at [place]   Final    20,OOO COLONIES/ML Performed at [facility]    Culture   Final    CANDIDA ALBICANS Performed at [facility]    Report  Status 02/03/2015 FINAL  Final  CSF culture     Status: None   Collection Time: 02/01/15 12:54 PM  Result Value Ref Range Status   Specimen Description CSF  Final   Special Requests Immunocompromised  Final   Gram Stain   Final    CYTOSPIN SLIDE WBC PRESENT, PREDOMINANTLY MONONUCLEAR NO ORGANISMS SEEN Performed at [facility] Performed at [facility]    Culture   Final    NO GROWTH 3 DAYS Performed at [facility]    Report Status 02/05/2015 FINAL  Final  Gram stain - STAT with CSF culture     Status: None   Collection Time: 02/01/15 12:54 PM  Result Value Ref Range Status   Specimen Description CSF  Final   Special Requests Immunocompromised  Final   Gram Stain   Final    WBC PRESENT, PREDOMINANTLY MONONUCLEAR NO ORGANISMS SEEN CSF CYTOSPIN    Report Status 02/01/2015 FINAL  Final     Studies:  Recent x-ray studies have been reviewed in detail by the Attending Physician  Scheduled Meds:  Scheduled Meds: . antiseptic oral rinse  7 mL Mouth Rinse q12n4p  . chlorhexidine  15 mL Mouth Rinse BID  . darbepoetin (ARANESP) injection - DIALYSIS  150 mcg Intravenous Q Tue-HD  . folic acid  1 mg Oral Daily  . heparin subcutaneous  5,000 Units Subcutaneous 3 times per day  . hydroxychloroquine  400 mg Oral Daily  . labetalol  200 mg Per Tube 3 times per day  . multivitamin  1 tablet Oral QHS  . mycophenolate  500 mg  Oral BID  . neomycin-bacitracin-polymyxin   Topical TID  . pantoprazole sodium  40 mg Per Tube QHS  . thiamine  100 mg Oral Daily    Time spent on care of this patient: 40 mins   WOODS, Geraldo Docker , MD  [facility] Office  [phone] Pager [phone]  On-Call/Text Page:      [email or web address]      password [email or web address]  If 7PM-7AM, please contact night-coverage [email or web address] Password [email or web address] 02/08/2015, 7:35 PM   LOS: 20 days   Care during the described time interval was provided by me .  I have reviewed this patient's available data, including medical history, events of note, physical examination, radiology studies and test results as part of my evaluation  Dia Crawford, MD [phone] Pager

## 2015-02-08 NOTE — Progress Notes (Signed)
S: No new CO O:BP 151/63 mmHg  Pulse 109  Temp(Src) 98.6 F (37 C) (Oral)  Resp 18  Ht 5\' 5"  (1.651 m)  Wt 51.5 kg (113 lb 8.6 oz)  BMI 18.89 kg/m2  SpO2 100%  LMP 07/18/2012  Intake/Output Summary (Last 24 hours) at 02/08/15 0932 Last data filed at 02/07/15 1700  Gross per 24 hour  Intake 791.25 ml  Output     75 ml  Net 716.25 ml   Weight change:  Gen: Awake and alert.  NG feeding tube in place CVS: RRR Resp: Clear Abd: + BS NTND No HSM Ext: No edema NEURO: CNI Ox3 no asterixis Rt IJ permcath.     Marland Kitchen antiseptic oral rinse  7 mL Mouth Rinse q12n4p  . chlorhexidine  15 mL Mouth Rinse BID  . darbepoetin (ARANESP) injection - DIALYSIS  150 mcg Intravenous Q Tue-HD  . folic acid  1 mg Oral Daily  . heparin subcutaneous  5,000 Units Subcutaneous 3 times per day  . hydroxychloroquine  400 mg Oral Daily  . labetalol  200 mg Per Tube 3 times per day  . multivitamin  1 tablet Oral QHS  . mycophenolate (CELLCEPT) IV  500 mg Intravenous Q12H  . neomycin-bacitracin-polymyxin   Topical TID  . pantoprazole sodium  40 mg Per Tube QHS  . thiamine  100 mg Oral Daily   No results found. BMET    Component Value Date/Time   NA 140 02/07/2015 0525   K 3.6 02/07/2015 0525   CL 102 02/07/2015 0525   CO2 28 02/07/2015 0525   GLUCOSE 146* 02/07/2015 0525   BUN 13 02/07/2015 0525   CREATININE 2.79* 02/07/2015 0525   CREATININE 3.49* 11/10/2014 1822   CREATININE 0.35* 10/23/2008 2300   CALCIUM 9.5 02/07/2015 0525   CALCIUM 8.9 10/10/2014 1931   GFRNONAA 18* 02/07/2015 0525   GFRAA 21* 02/07/2015 0525   CBC    Component Value Date/Time   WBC 6.4 02/06/2015 0447   WBC 5.5 03/01/2012 1128   RBC 2.79* 02/06/2015 0447   RBC 3.56* 03/01/2012 1128   HGB 7.7* 02/06/2015 0447   HGB 9.0* 03/01/2012 1128   HCT 24.9* 02/06/2015 0447   HCT 29.3* 03/01/2012 1128   PLT 357 02/06/2015 0447   MCV 89.2 02/06/2015 0447   MCV 82.4 03/01/2012 1128   MCH 27.6 02/06/2015 0447   MCH 25.3*  03/01/2012 1128   MCHC 30.9 02/06/2015 0447   MCHC 30.7* 03/01/2012 1128   RDW 17.3* 02/06/2015 0447   LYMPHSABS 0.6* 11/18/2014 2324   MONOABS 0.4 11/18/2014 2324   EOSABS 0.0 11/18/2014 2324   BASOSABS 0.0 11/18/2014 2324     Assessment: 1. 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 Anemia on aranesp 3. Sec HPTH, Start sensipar when able to take PO meds 4. SLE  Plan: 1. HD today  Annleigh Knueppel T

## 2015-02-08 NOTE — Consult Note (Addendum)
WOC wound consult note Reason for Consult: Consult requested for area above lip/below nose. Pt was intubated with ETT 2 separate times during the last week before transferring to [place]. Appearance is consistent with medical device-related pressure ulcer. She has lupus and fragile skin.   Wound type: Deep tissue injury Pressure Ulcer POA: No Measurement:1X1.2cm, dry, dark red partial thickness wound to outer edges and darker-colored wound bed to middle of wound, approx .5X.5cm is deep tissue injury. Drainage (amount, consistency, odor) No odor or drainage Dressing procedure/placement/frequency: Neosporin to promote moist healing.  Deep tissue injuries are high risk to evolve into full thickness wounds despite optimal care.  Pt is very groggy and did not appear to understand plan of care. No family members present to discuss topical treatment. Please re-consult if further assistance is needed.  Thank-you,  Julien Girt MSN, [place], [place], [place], [place]

## 2015-02-08 NOTE — Progress Notes (Signed)
Physical Therapy Treatment Patient Details Name: [name] [name] MRN: [id number] DOB: [birth year] Today's Date: February 12, 2015    History of Present Illness Pt is a 54 y.o. female presents with SOB on 01/18/15. In addition she had labwork in the ED and this shows significant anemia. Patient has been having anemia for some time. She was recently in the hospital for the anemia but a workup was not conclusive. Also she had a minimally elevated troponin level. Pt required NRB on 3/19 and CVVHD was initiated on 3/21.     PT Comments    Attempted to see patient earlier in day - was in HD.  This pm, patient agreed to perform exercises.  Able to complete with verbal cues.  Follow Up Recommendations  CIR;Supervision/Assistance - 24 hour     Equipment Recommendations  None recommended by PT    Recommendations for Other Services       Precautions / Restrictions Precautions Precautions: Fall Restrictions Weight Bearing Restrictions: No    Mobility  Bed Mobility                  Transfers                    Ambulation/Gait                 Stairs            Wheelchair Mobility    Modified Rankin (Stroke Patients Only)       Balance                                    Cognition Arousal/Alertness: Awake/alert Behavior During Therapy: WFL for tasks assessed/performed Overall Cognitive Status: Within Functional Limits for tasks assessed                      Exercises General Exercises - Upper Extremity Shoulder Flexion: AROM;Both;10 reps;Supine Shoulder Horizontal ABduction: AROM;Both;10 reps;Supine Shoulder Horizontal ADduction: AROM;Both;10 reps;Supine General Exercises - Lower Extremity Ankle Circles/Pumps: AROM;Both;10 reps;Supine Gluteal Sets: AROM;Both;10 reps;Supine Short Arc Quad: AROM;Both;10 reps;Supine Heel Slides: AROM;Both;10 reps;Supine Hip ABduction/ADduction: AROM;Both;10 reps;Supine    General Comments        Pertinent Vitals/Pain Pain Assessment: No/denies pain    Home Living                      Prior Function            PT Goals (current goals can now be found in the care plan section) Progress towards PT goals: Progressing toward goals    Frequency  Min 3X/week    PT Plan Current plan remains appropriate    Co-evaluation             End of Session   Activity Tolerance: Patient limited by fatigue Patient left: in bed;with call bell/phone within reach;with bed alarm set     Time: 4098-1191 PT Time Calculation (min) (ACUTE ONLY): 10 min  Charges:  $Therapeutic Exercise: 8-22 mins                    G Codes:      Despina Pole 02/12/15, 6:42 PM Carita Pian. Sanjuana Kava, [place] Pager [phone]

## 2015-02-09 LAB — RENAL FUNCTION PANEL
ANION GAP: 11 (ref 5–15)
Albumin: 2.4 g/dL — ABNORMAL LOW (ref 3.5–5.2)
BUN: 12 mg/dL (ref 6–23)
CHLORIDE: 96 mmol/L (ref 96–112)
CO2: 29 mmol/L (ref 19–32)
Calcium: 9.9 mg/dL (ref 8.4–10.5)
Creatinine, Ser: 2.44 mg/dL — ABNORMAL HIGH (ref 0.50–1.10)
GFR calc Af Amer: 25 mL/min — ABNORMAL LOW (ref >90)
GFR, EST NON AFRICAN AMERICAN: 22 mL/min — AB (ref >90)
Glucose, Bld: 135 mg/dL — ABNORMAL HIGH (ref 70–99)
PHOSPHORUS: 2.3 mg/dL (ref 2.3–4.6)
POTASSIUM: 3.6 mmol/L (ref 3.5–5.1)
SODIUM: 136 mmol/L (ref 135–145)

## 2015-02-09 LAB — GLUCOSE, CAPILLARY
GLUCOSE-CAPILLARY: 113 mg/dL — AB (ref 70–99)
GLUCOSE-CAPILLARY: 114 mg/dL — AB (ref 70–99)
GLUCOSE-CAPILLARY: 134 mg/dL — AB (ref 70–99)
Glucose-Capillary: 111 mg/dL — ABNORMAL HIGH (ref 70–99)
Glucose-Capillary: 121 mg/dL — ABNORMAL HIGH (ref 70–99)
Glucose-Capillary: 137 mg/dL — ABNORMAL HIGH (ref 70–99)
Glucose-Capillary: 159 mg/dL — ABNORMAL HIGH (ref 70–99)

## 2015-02-09 LAB — CORTISOL: Cortisol, Plasma: 25.1 ug/dL

## 2015-02-09 MED ORDER — WHITE PETROLATUM GEL
Status: AC
  Administered 2015-02-09: 18:00:00
  Filled 2015-02-09: qty 1

## 2015-02-09 NOTE — Progress Notes (Signed)
Patient's HR back to baseline of 90-100's after receiving scheduled labetalol. Dr. Eliseo Squires notified.  Joellen Jersey, RN.

## 2015-02-09 NOTE — Progress Notes (Addendum)
Progress Note  [name] [name] DSK:[id number] DOB: May 07, 1961 DOA: 01/18/2015 PCP: Lamar Blinks, MD  Admit HPI / Brief Narrative: 54 y/o female with lupus (on cellcept, plaquenil) and associated CKD, DM, and HTN who presented 3/17 with vague c/o SOB, diarrhea, malaise, weakness, and cough. She was admitted by [facility] and r/o for flu, and was undergoing further w/u for diarrhea, mildly elevated troponin and anemia. Overnight 3/19 she developed intermittent SVT, then persistent tachycardia with HR 150's and worsening SOB and hypoxia requiring NRB. PCCM consulted 3/20. She required ETT/MV 3/20. CXR with B infiltrates. Initiated CVVHD on 3/21 pm.   HPI/Subjective: No SOB  Assessment/Plan: Acute hypoxic respiratory failure - acute pulmonary edema in setting HTN and renal failure infiltrates have completely cleared -sats 96% on RA - resolved   SVT/sinus tachycardia HR 150 at time of her acute decompensation, Adenosine given, appeared to be sinus with some non-conducted p-waves - presumed reactive to critical illness - PE was questioned butBLE venous dopplers negative; doubt PE (heparin gtt stopped 3/23) - still having intermittent tachy, but not as severe - TSH normal - will check AM cortisol   Hypotension BP has now normalized   Newly diagnosed Diastolic CHF -EF 62-03% with MOD aortic, tricuspid and mitral regurg - volume management per HD  -Inc Labetalol to 250 mg TID  Acute on chronic renal failure, presume lupus nephritis -As per Nephrology - ongoing HD - plan for AVF as improves further   SLE -continue Cellcept and Plaq  Anemia -s/p 1 unit PRBC 3/22 - Hgb presently stable   Hyponatremia  -Corrected w/ HD   Metabolic acidosis -Corrected w/ HD   Diarrhea / Abdom pain  Initially resolved - returned with reglan 3/26 > flexi seal placed - neg abd xray - follow   Acute encephalopathy - fluctuating -Head Ct neg 3/27 - EEG 5/59 metabolic enceph - MRI 7/41 > Remote  RIGHT inferior brainstem hemorrhage, stable RIGHT basal ganglia micro hemorrhage, Remote small RIGHT inferior cerebellar infarct - CSF 3/31 neg for lupus cerebri - Neuro signed off on 4/3  Protein Calorie Malnutrition  Tube feeds presently - NPO since procedure on 4/6 -SLP to re-eval    Code Status: FULL Family Communication: no family present at time of exam Disposition Plan: CIR?    Consultants: Dr Fleet Contras. (Nephrology) Barry Dienes (Neurology) Dr.David B Simonds [facility] Jerilynn Mages)   Dr.Brian Starlyn Skeans (vascular surgery)   Procedure/Significant Events: 3/19 TTE UL84-53%, grade 1 diastolic dysfunction, mod AR, mod MR, mod TR. No pericardial effusion 3/20 venous duplex B LE - negative 3/20 intubated  3/21 Adenosine challenge > sinus rhythm with some non-conducted p-waves 3/23 CVVHD, I/O even last 24 h, tachycardia improved, fent + propofol 3/24 high residuals, on max dose fentanyl. Rx'd with Reglan  3/26 continued agitation with WUA / SBT, changed back to propofol/fentanyl  3/28 seroquel trance, neuro consult 3/29 more awake, follows commands - extubated  3/30 reintubated, bronchoscopy - for LLL atx 4/03 fentanyl drip stopped 4/04 tunneled cath placed 4/04 extubated    Culture   Antibiotics:   DVT prophylaxis: Heparin SQ   Devices    LINES / TUBES:      Continuous Infusions: . feeding supplement (VITAL AF 1.2 CAL) 1,000 mL (02/07/15 2138)    Objective: VITAL SIGNS: Temp: 97.6 F (36.4 C) (04/08 0404) Temp Source: Oral (04/08 0404) BP: 124/74 mmHg (04/08 0404) Pulse Rate: 66 (04/08 0404)    Intake/Output Summary (Last 24 hours) at 02/09/15 0818 Last data filed  at 02/08/15 1600  Gross per 24 hour  Intake      0 ml  Output   1000 ml  Net  -1000 ml     Exam: General: NG tube in place Lungs: Clear to auscultation bilaterally without wheezes or crackles Cardiovascular: Regular rate and rhythm without murmur gallop or  rub normal S1 and S2 Abdomen: Nontender, nondistended, soft, bowel sounds positive, no rebound, no ascites, no appreciable mass Extremities: No significant cyanosis, clubbing, or edema bilateral lower extremities  Data Reviewed: Basic Metabolic Panel:  Recent Labs Lab 02/03/15 0001  02/03/15 1105 02/03/15 2305 02/04/15 0435 02/05/15 0230 02/06/15 0447 02/07/15 0525 02/08/15 1020 02/09/15 0441  NA  --   < >  --   --  138 138 140 140 138 136  K  --   < >  --   --  3.8 3.8 3.7 3.6 3.9 3.6  CL  --   < >  --   --  100 103 106 102 101 96  CO2  --   < >  --   --  23 23 24 28 24 29   GLUCOSE  --   < >  --   --  154* 149* 110* 146* 152* 135*  BUN  --   < >  --   --  31* 43* 48* 13 30* 12  CREATININE  --   < >  --   --  4.12* 5.38* 6.59* 2.79* 4.52* 2.44*  CALCIUM  --   < >  --   --  9.5 9.4 9.5 9.5 9.8 9.9  MG 2.7*  --  1.9 2.0 2.0 2.0  --   --   --   --   PHOS 5.5*  < > <1.0* 3.4 3.4  3.4 3.3  3.3 4.0 2.1* 2.3 2.3  < > = values in this interval not displayed. Liver Function Tests:  Recent Labs Lab 02/05/15 0230 02/06/15 0447 02/07/15 0525 02/08/15 1020 02/09/15 0441  ALBUMIN 2.0* 2.0* 2.2* 2.3* 2.4*   No results for input(s): LIPASE, AMYLASE in the last 168 hours. No results for input(s): AMMONIA in the last 168 hours. CBC:  Recent Labs Lab 02/03/15 0500 02/04/15 0500 02/05/15 0230 02/06/15 0447 02/08/15 1020  WBC 8.8 7.1 6.8 6.4 6.9  HGB 8.5* 7.9* 7.5* 7.7* 7.9*  HCT 27.0* 26.1* 24.0* 24.9* 25.9*  MCV 91.2 90.9 89.9 89.2 91.2  PLT 377 371 353 357 326   Cardiac Enzymes: No results for input(s): CKTOTAL, CKMB, CKMBINDEX, TROPONINI in the last 168 hours. BNP (last 3 results) No results for input(s): BNP in the last 8760 hours.  ProBNP (last 3 results)  Recent Labs  10/05/14 0521  PROBNP 20673.0*    CBG:  Recent Labs Lab 02/08/15 1139 02/08/15 1702 02/08/15 2027 02/09/15 0039 02/09/15 0411  GLUCAP 123* 119* 125* 114* 134*    Recent Results (from  the past 240 hour(s))  Culture, bal-quantitative     Status: None   Collection Time: 01/31/15  3:11 PM  Result Value Ref Range Status   Specimen Description BRONCHIAL ALVEOLAR LAVAGE  Final   Special Requests Immunocompromised  Final   Gram Stain   Final    FEW WBC PRESENT,BOTH PMN AND MONONUCLEAR RARE SQUAMOUS EPITHELIAL CELLS PRESENT NO ORGANISMS SEEN Performed at [facility]   Final    20,OOO COLONIES/ML Performed at [facility]    Culture   Final    CANDIDA  ALBICANS Performed at [facility]    Report Status 02/03/2015 FINAL  Final  CSF culture     Status: None   Collection Time: 02/01/15 12:54 PM  Result Value Ref Range Status   Specimen Description CSF  Final   Special Requests Immunocompromised  Final   Gram Stain   Final    CYTOSPIN SLIDE WBC PRESENT, PREDOMINANTLY MONONUCLEAR NO ORGANISMS SEEN Performed at [facility] Performed at [facility]    Culture   Final    NO GROWTH 3 DAYS Performed at [facility]    Report Status 02/05/2015 FINAL  Final  Gram stain - STAT with CSF culture     Status: None   Collection Time: 02/01/15 12:54 PM  Result Value Ref Range Status   Specimen Description CSF  Final   Special Requests Immunocompromised  Final   Gram Stain   Final    WBC PRESENT, PREDOMINANTLY MONONUCLEAR NO ORGANISMS SEEN CSF CYTOSPIN    Report Status 02/01/2015 FINAL  Final       Scheduled Meds:  Scheduled Meds: . antiseptic oral rinse  7 mL Mouth Rinse q12n4p  . chlorhexidine  15 mL Mouth Rinse BID  . darbepoetin (ARANESP) injection - DIALYSIS  150 mcg Intravenous Q Tue-HD  . folic acid  1 mg Oral Daily  . heparin subcutaneous  5,000 Units Subcutaneous 3 times per day  . hydroxychloroquine  400 mg Oral Daily  . labetalol  250 mg Per Tube 3 times per day  . multivitamin  1 tablet Oral QHS  . mycophenolate  500 mg Oral BID  . neomycin-bacitracin-polymyxin   Topical TID  .  pantoprazole sodium  40 mg Per Tube QHS  . thiamine  100 mg Oral Daily    Time spent on care of this patient: 25 mins   Eulogio Bear , DO  [facility] Office  [phone] Pager - [phone]  On-Call/Text Page:      [email or web address]      password [email or web address]  If 7PM-7AM, please contact night-coverage [email or web address] Password [email or web address] 02/09/2015, 8:18 AM   LOS: 21 days

## 2015-02-09 NOTE — Progress Notes (Signed)
Patient ID: [name] [name], female   DOB: 1961/09/04, 54 y.o.   MRN: [id number] Now up on renal floor. Marked improvement since the weekend with intubation. Continues to dialyze via a tunneled catheter.  Vein map shows good caliber basilic vein bilaterally for upper arm basilic vein transposition. Patient is right handed. Will follow along and plan renal access next week if she continues to improve

## 2015-02-09 NOTE — Progress Notes (Signed)
S: No new CO.  Speaking much better O:BP 131/67 mmHg  Pulse 112  Temp(Src) 98.9 F (37.2 C) (Oral)  Resp 18  Ht 5\' 5"  (1.651 m)  Wt 53.3 kg (117 lb 8.1 oz)  BMI 19.55 kg/m2  SpO2 99%  LMP 07/18/2012  Intake/Output Summary (Last 24 hours) at 02/09/15 1032 Last data filed at 02/09/15 0955  Gross per 24 hour  Intake 360.42 ml  Output   1000 ml  Net -639.58 ml   Weight change:  Gen: Awake and alert.  NG feeding tube in place CVS: RRR Resp: Clear Abd: + BS NTND No HSM Ext: No edema NEURO: CNI Ox3 no asterixis Rt IJ permcath.     Marland Kitchen antiseptic oral rinse  7 mL Mouth Rinse q12n4p  . chlorhexidine  15 mL Mouth Rinse BID  . darbepoetin (ARANESP) injection - DIALYSIS  150 mcg Intravenous Q Tue-HD  . folic acid  1 mg Oral Daily  . heparin subcutaneous  5,000 Units Subcutaneous 3 times per day  . hydroxychloroquine  400 mg Oral Daily  . labetalol  250 mg Per Tube 3 times per day  . multivitamin  1 tablet Oral QHS  . mycophenolate  500 mg Oral BID  . neomycin-bacitracin-polymyxin   Topical TID  . pantoprazole sodium  40 mg Per Tube QHS  . thiamine  100 mg Oral Daily   No results found. BMET    Component Value Date/Time   NA 136 02/09/2015 0441   K 3.6 02/09/2015 0441   CL 96 02/09/2015 0441   CO2 29 02/09/2015 0441   GLUCOSE 135* 02/09/2015 0441   BUN 12 02/09/2015 0441   CREATININE 2.44* 02/09/2015 0441   CREATININE 3.49* 11/10/2014 1822   CREATININE 0.35* 10/23/2008 2300   CALCIUM 9.9 02/09/2015 0441   CALCIUM 8.9 10/10/2014 1931   GFRNONAA 22* 02/09/2015 0441   GFRAA 25* 02/09/2015 0441   CBC    Component Value Date/Time   WBC 6.9 02/08/2015 1020   WBC 5.5 03/01/2012 1128   RBC 2.84* 02/08/2015 1020   RBC 3.56* 03/01/2012 1128   HGB 7.9* 02/08/2015 1020   HGB 9.0* 03/01/2012 1128   HCT 25.9* 02/08/2015 1020   HCT 29.3* 03/01/2012 1128   PLT 326 02/08/2015 1020   MCV 91.2 02/08/2015 1020   MCV 82.4 03/01/2012 1128   MCH 27.8 02/08/2015 1020   MCH 25.3*  03/01/2012 1128   MCHC 30.5 02/08/2015 1020   MCHC 30.7* 03/01/2012 1128   RDW 17.6* 02/08/2015 1020   LYMPHSABS 0.6* 11/18/2014 2324   MONOABS 0.4 11/18/2014 2324   EOSABS 0.0 11/18/2014 2324   BASOSABS 0.0 11/18/2014 2324     Assessment: 1. 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 Anemia on aranesp 3. 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 SLE  Plan: 1. HD tomorrow 2. Resume Clip process for outpt spot   Markeeta Scalf T

## 2015-02-09 NOTE — Progress Notes (Signed)
I met with pt and her sister at bedside. Sister states children and sisters can arrange 24/7 assist after d/c therefore they would like me to pursue insurance approval for inpt rehab. I explained that I will but if bed unavailable when she is medically ready, other rehab venues would have to be explored ie inpt hospital rehab elsewhere or SNF. Noted Nephrology is resuming Clip process for out pt spot today. We will follow up on Monday.I have updated RN CM and SW. [phone]

## 2015-02-09 NOTE — Plan of Care (Signed)
Problem: Phase I Progression Outcomes Goal: OOB as tolerated unless otherwise ordered Outcome: Progressing PT consulted.

## 2015-02-09 NOTE — Progress Notes (Signed)
Patient's HR sustaining 130-140's. Dr. Eliseo Squires notified. Stated to give scheduled labetalol and continue to monitor. Patient was working with physical therapy at the time of elevated HR.   Also received order to remove flexiseal at this time, per patient's request.  Joellen Jersey, RN.

## 2015-02-09 NOTE — Progress Notes (Signed)
Physical Therapy Treatment Patient Details Name: [name] [name] MRN: [id number] DOB: [birth year] Today's Date: 02/09/2015    History of Present Illness Pt is a 54 y.o. female presents with SOB on 01/18/15. In addition she had labwork in the ED and this shows significant anemia. Patient has been having anemia for some time. She was recently in the hospital for the anemia but a workup was not conclusive. Also she had a minimally elevated troponin level. Pt required NRB on 3/19 and CVVHD was initiated on 3/21.     PT Comments    Patient making improvement with mobility.  Agree with need for CIR at discharge.  Follow Up Recommendations  CIR;Supervision/Assistance - 24 hour     Equipment Recommendations  None recommended by PT    Recommendations for Other Services       Precautions / Restrictions Precautions Precautions: Fall Restrictions Weight Bearing Restrictions: No    Mobility  Bed Mobility Overal bed mobility: Needs Assistance Bed Mobility: Supine to Sit     Supine to sit: Mod assist     General bed mobility comments: Patient able to move LE's to EOB.  Assist to raise trunk to sitting and to scoot to EOB.  Transfers Overall transfer level: Needs assistance Equipment used: 2 person hand held assist Transfers: Sit to/from Stand Sit to Stand: +2 physical assistance;Min assist         General transfer comment: Verbal cues for hand placement.  Assist to power up to standing and for balance once upright.  Ambulation/Gait Ambulation/Gait assistance: Min assist;+2 physical assistance Ambulation Distance (Feet): 24 Feet Assistive device: 2 person hand held assist Gait Pattern/deviations: Step-through pattern;Decreased step length - right;Decreased step length - left;Decreased stride length;Shuffle;Ataxic;Staggering left;Staggering right Gait velocity: Decreased Gait velocity interpretation: Below normal speed for age/gender General Gait Details: Patient with slow,  unsteady gait, with staggering to both sides.  Requires UE support to maintain balance.   Stairs            Wheelchair Mobility    Modified Rankin (Stroke Patients Only)       Balance           Standing balance support: Bilateral upper extremity supported Standing balance-Leahy Scale: Poor                      Cognition Arousal/Alertness: Awake/alert Behavior During Therapy: WFL for tasks assessed/performed Overall Cognitive Status: Within Functional Limits for tasks assessed                      Exercises      General Comments        Pertinent Vitals/Pain Pain Assessment: Faces Faces Pain Scale: Hurts a little bit Pain Location: Bottom Pain Descriptors / Indicators: Sore Pain Intervention(s): Repositioned;Monitored during session    Home Living                      Prior Function            PT Goals (current goals can now be found in the care plan section) Progress towards PT goals: Progressing toward goals    Frequency  Min 3X/week    PT Plan Current plan remains appropriate    Co-evaluation             End of Session Equipment Utilized During Treatment: Gait belt Activity Tolerance: Patient limited by fatigue Patient left: in chair;with call bell/phone within reach;with family/visitor present  Time: 5638-7564 PT Time Calculation (min) (ACUTE ONLY): 13 min  Charges:  $Therapeutic Activity: 8-22 mins                    G Codes:      Despina Pole 02-25-2015, 2:31 PM Carita Pian. Sanjuana Kava, [place] Pager [phone]

## 2015-02-09 NOTE — Progress Notes (Signed)
Speech Language Pathology Treatment: Dysphagia  Patient Details Name: [name] [name] MRN: [id number] DOB: Jan 26, 1961 Today's Date: 02/09/2015 Time: 4098-1191 SLP Time Calculation (min) (ACUTE ONLY): 24 min  Assessment / Plan / Recommendation Clinical Impression  F/u after 4/6 FEES - pt with stronger phonation, marginally stronger cough.  Oral suctioning unit set-up and pt was able to complete her own oral care.  Consumed 4 oz trial purees (applesauce) with min verbal/tactile cues for repeat swallow, throat-clearing.  Pt appears ready for repeat swallow study to determine readiness for diet advancement (given sensory deficits per FEES, leading to aspiration of thick viscosities, instrumental study will be necessary).  Verbal order received from Dr. Eliseo Squires to proceed; will schedule MBS for next date.     HPI HPI: 54 y.o. female with PMH: lupus, CKD. DM, CVA (pons discharged on regular diet and thin 11/2014), lower GI bleed admitted with with shortness of breath. Pt experienced acute hypoxic respiratory failure on 3/20, requiring intubation; extubated 3/29. She required reintubation 3/30-4/4. Bedside swallow 01/30/15 completed revealing multiple s/s aspiration , reduced hyolaryngeal excursion. FEES 10/04/14 showed penetration of thin liquids during consumption of large, consecutive boluses. Pt with adequate sensation, and spontaneously cleared throat, which effectively expelled penetrate.CXR continued improvement in left perihilar airspace opacification. FEES recommended following BSE.   Pertinent Vitals Pain Assessment: No/denies pain  SLP Plan  MBS    Recommendations Diet recommendations: NPO Medication Administration: Via alternative means              Oral Care Recommendations: Oral care Q4 per protocol Follow up Recommendations: Inpatient Rehab Plan: [place]. Tivis Ringer, [place] CCC/SLP Pager [phone]      [name] 02/09/2015, 11:25 AM

## 2015-02-09 NOTE — Evaluation (Signed)
Occupational Therapy Evaluation Patient Details Name: [name] [name] MRN: [id number] DOB: [birth year] Today's Date: 02/09/2015    History of Present Illness Pt is a 54 y.o. female presents with SOB on 01/18/15. In addition she had labwork in the ED and this shows significant anemia. Patient has been having anemia for some time. She was recently in the hospital for the anemia but a workup was not conclusive. Also she had a minimally elevated troponin level. Pt required NRB on 3/19 and CVVHD was initiated on 3/21.    Clinical Impression   This 54 yo female admitted with above presents to acute OT with generalized deconditioning, decreased mobility, decreased balance, and increased pain all affecting her ability to care for herself at home. She will benefit from acute OT with follow up [place] to get to a S level or better (I believe better) so she can return home.    Follow Up Recommendations  CIR    Equipment Recommendations   (TBD at next venue)       Precautions / Restrictions Precautions Precautions: Fall Restrictions Weight Bearing Restrictions: No      Mobility Bed Mobility     General bed mobility comments: Pt already up in recliner upon arrival  Transfers Overall transfer level: Needs assistance Equipment used: 1 person hand held assist Transfers: Sit to/from Stand Sit to Stand: Mod assist         General transfer comment: Verbal cues for hand placement.  Assist to power up to standing.         ADL Overall ADL's : Needs assistance/impaired Eating/Feeding: NPO   Grooming: Set up;Supervision/safety;Sitting   Upper Body Bathing: Set up;Supervision/ safety;Sitting   Lower Body Bathing: Minimal assistance (with Mod A sit<>stand)   Upper Body Dressing : Set up;Supervision/safety;Sitting   Lower Body Dressing: Minimal assistance (with Mod A sit<>stand)   Toilet Transfer: +2 for physical assistance;Minimal assistance (bed to [facility] next to each other)   Toileting-  Clothing Manipulation and Hygiene: Moderate assistance (with Mod A sit<>stand and min A to maintain)               Vision Additional Comments: No change from baseline          Pertinent Vitals/Pain Pain Assessment: 0-10 Faces Pain Scale: Hurts even more Pain Location: bottom due to flexiseal Pain Descriptors / Indicators: Sore Pain Intervention(s): Monitored during session (RN made aware)     Hand Dominance Right   Extremity/Trunk Assessment Upper Extremity Assessment Upper Extremity Assessment: Overall WFL for tasks assessed           Communication Communication Communication:  (talks very low)   Cognition Arousal/Alertness: Awake/alert Behavior During Therapy: WFL for tasks assessed/performed Overall Cognitive Status: Within Functional Limits for tasks assessed                                Home Living Family/patient expects to be discharged to:: Private residence Living Arrangements: Children Available Help at Discharge: Available 24 hours/day;Family Type of Home: Apartment Home Access: Stairs to enter [employer] of Steps: 4 Entrance Stairs-Rails: Right Home Layout: Two level;Able to live on main level with bedroom/bathroom Alternate Level Stairs-Number of Steps: flight   Bathroom Shower/Tub: Walk-in shower;Door   Bathroom Toilet: Handicapped height Bathroom Accessibility: Yes   Home Equipment: Environmental consultant - 2 wheels;Cane - single point;Shower seat;Bedside commode          Prior Functioning/Environment Level of Independence: Independent  with assistive device(s)        Comments: Pt reports on her bad days she uses the cane or the RW.      OT Diagnosis: Generalized weakness;Acute pain   OT Problem List: Decreased strength;Impaired balance (sitting and/or standing);Pain;Decreased knowledge of use of DME or AE;Decreased knowledge of precautions (generalized deconditioning)   OT Treatment/Interventions: Self-care/ADL  training;Therapeutic activities;DME and/or AE instruction;Patient/family education;Balance training    OT Goals(Current goals can be found in the care plan section) Acute Rehab OT Goals Patient Stated Goal: to go to rehab then home OT Goal Formulation: With patient Time For Goal Achievement: 02/23/15 Potential to Achieve Goals: Good  OT Frequency: Min 2X/week              End of Session Nurse Communication:  (Pt asking if her flexiseal could be removed)  Activity Tolerance: Patient tolerated treatment well Patient left: in chair;with call bell/phone within reach;with family/visitor present   Time: 2694-8546 OT Time Calculation (min): 31 min Charges:  OT General Charges $OT Visit: 1 Procedure OT Evaluation $Initial OT Evaluation Tier I: 1 Procedure OT Treatments $Self Care/Home Management : 8-22 mins  Almon Register [phone] 02/09/2015, 3:02 PM

## 2015-02-10 ENCOUNTER — Inpatient Hospital Stay (HOSPITAL_COMMUNITY): Payer: Managed Care, Other (non HMO)

## 2015-02-10 LAB — RENAL FUNCTION PANEL
Albumin: 2.5 g/dL — ABNORMAL LOW (ref 3.5–5.2)
Anion gap: 9 (ref 5–15)
BUN: 29 mg/dL — ABNORMAL HIGH (ref 6–23)
CO2: 31 mmol/L (ref 19–32)
CREATININE: 4.08 mg/dL — AB (ref 0.50–1.10)
Calcium: 9.8 mg/dL (ref 8.4–10.5)
Chloride: 95 mmol/L — ABNORMAL LOW (ref 96–112)
GFR calc Af Amer: 13 mL/min — ABNORMAL LOW (ref >90)
GFR calc non Af Amer: 12 mL/min — ABNORMAL LOW (ref >90)
Glucose, Bld: 139 mg/dL — ABNORMAL HIGH (ref 70–99)
PHOSPHORUS: 2.9 mg/dL (ref 2.3–4.6)
Potassium: 4 mmol/L (ref 3.5–5.1)
Sodium: 135 mmol/L (ref 135–145)

## 2015-02-10 LAB — GLUCOSE, CAPILLARY
GLUCOSE-CAPILLARY: 125 mg/dL — AB (ref 70–99)
GLUCOSE-CAPILLARY: 147 mg/dL — AB (ref 70–99)
GLUCOSE-CAPILLARY: 91 mg/dL (ref 70–99)
Glucose-Capillary: 123 mg/dL — ABNORMAL HIGH (ref 70–99)
Glucose-Capillary: 118 mg/dL — ABNORMAL HIGH (ref 70–99)

## 2015-02-10 NOTE — Progress Notes (Signed)
Progress Note  [name] [name] BMW:[id number] DOB: 1961/09/14 DOA: 01/18/2015 PCP: Lamar Blinks, MD  Admit HPI / Brief Narrative: 54 y/o female with lupus (on cellcept, plaquenil) and associated CKD, DM, and HTN who presented 3/17 with vague c/o SOB, diarrhea, malaise, weakness, and cough. She was admitted by [facility] and r/o for flu, and was undergoing further w/u for diarrhea, mildly elevated troponin and anemia. Overnight 3/19 she developed intermittent SVT, then persistent tachycardia with HR 150's and worsening SOB and hypoxia requiring NRB. PCCM consulted 3/20. She required ETT/MV 3/20. CXR with B infiltrates. Initiated CVVHD on 3/21.  HPI/Subjective: Asking for NG tube out  Assessment/Plan: Acute hypoxic respiratory failure - acute pulmonary edema in setting HTN and renal failure infiltrates have completely cleared -sats 96% on RA - resolved   SVT/sinus tachycardia HR 150 at time of her acute decompensation, Adenosine given, appeared to be sinus with some non-conducted p-waves - presumed reactive to critical illness - PE was questioned butBLE venous dopplers negative; doubt PE (heparin gtt stopped 3/23) - still having intermittent tachy, but not as severe - TSH normal - AM cortisol ok  Hypotension BP has now normalized   Newly diagnosed Diastolic CHF -EF 27-25% with MOD aortic, tricuspid and mitral regurg - volume management per HD  -Inc Labetalol to 250 mg TID  Acute on chronic renal failure, presume lupus nephritis -As per Nephrology - ongoing HD - plan for AVF as improves further   SLE -continue Cellcept and Plaq  Anemia -s/p 1 unit PRBC 3/22 - Hgb presently stable   Hyponatremia  -Corrected w/ HD   Metabolic acidosis -Corrected w/ HD   Diarrhea / Abdom pain  flexiseal out  Acute encephalopathy - fluctuating -Head Ct neg 3/27 - EEG 3/66 metabolic enceph - MRI 4/40 > Remote RIGHT inferior brainstem hemorrhage, stable RIGHT basal ganglia micro hemorrhage,  Remote small RIGHT inferior cerebellar infarct - CSF 3/31 neg for lupus cerebri - Neuro signed off on 4/3  Protein Calorie Malnutrition  Tube feeds presently - NPO since procedure on 4/6 -SLP to re-eval    Code Status: FULL Family Communication: friend at bedside Disposition Plan: CIR?    Consultants: Dr Fleet Contras. (Nephrology) Barry Dienes (Neurology) Dr.David B Simonds [facility] Jerilynn Mages)   Dr.Brian Starlyn Skeans (vascular surgery)   Procedure/Significant Events: 3/19 TTE HK74-25%, grade 1 diastolic dysfunction, mod AR, mod MR, mod TR. No pericardial effusion 3/20 venous duplex B LE - negative 3/20 intubated  3/21 Adenosine challenge > sinus rhythm with some non-conducted p-waves 3/23 CVVHD, I/O even last 24 h, tachycardia improved, fent + propofol 3/24 high residuals, on max dose fentanyl. Rx'd with Reglan  3/26 continued agitation with WUA / SBT, changed back to propofol/fentanyl  3/28 seroquel trance, neuro consult 3/29 more awake, follows commands - extubated  3/30 reintubated, bronchoscopy - for LLL atx 4/03 fentanyl drip stopped 4/04 tunneled cath placed 4/04 extubated    Culture   Antibiotics:   DVT prophylaxis: Heparin SQ   Devices    LINES / TUBES:      Continuous Infusions: . feeding supplement (VITAL AF 1.2 CAL) 1,000 mL (02/09/15 1454)    Objective: VITAL SIGNS: Temp: 98.6 F (37 C) (04/09 1213) Temp Source: Oral (04/09 1213) BP: 134/67 mmHg (04/09 1213) Pulse Rate: 130 (04/09 1213)    Intake/Output Summary (Last 24 hours) at 02/10/15 1235 Last data filed at 02/10/15 1135  Gross per 24 hour  Intake 524.75 ml  Output   1526 ml  Net -1001.25  ml     Exam: General: NG tube in place Lungs: Clear, no wheezing Cardiovascular: rrr Abdomen: +BS, soft Extremities: no edema  Data Reviewed: Basic Metabolic Panel:  Recent Labs Lab 02/03/15 2305  02/04/15 0435 02/05/15 0230 02/06/15 0447 02/07/15 0525  02/08/15 1020 02/09/15 0441 02/10/15 0738  NA  --   < > 138 138 140 140 138 136 135  K  --   < > 3.8 3.8 3.7 3.6 3.9 3.6 4.0  CL  --   < > 100 103 106 102 101 96 95*  CO2  --   < > 23 23 24 28 24 29 31   GLUCOSE  --   < > 154* 149* 110* 146* 152* 135* 139*  BUN  --   < > 31* 43* 48* 13 30* 12 29*  CREATININE  --   < > 4.12* 5.38* 6.59* 2.79* 4.52* 2.44* 4.08*  CALCIUM  --   < > 9.5 9.4 9.5 9.5 9.8 9.9 9.8  MG 2.0  --  2.0 2.0  --   --   --   --   --   PHOS 3.4  --  3.4  3.4 3.3  3.3 4.0 2.1* 2.3 2.3 2.9  < > = values in this interval not displayed. Liver Function Tests:  Recent Labs Lab 02/06/15 0447 02/07/15 0525 02/08/15 1020 02/09/15 0441 02/10/15 0738  ALBUMIN 2.0* 2.2* 2.3* 2.4* 2.5*   No results for input(s): LIPASE, AMYLASE in the last 168 hours. No results for input(s): AMMONIA in the last 168 hours. CBC:  Recent Labs Lab 02/04/15 0500 02/05/15 0230 02/06/15 0447 02/08/15 1020  WBC 7.1 6.8 6.4 6.9  HGB 7.9* 7.5* 7.7* 7.9*  HCT 26.1* 24.0* 24.9* 25.9*  MCV 90.9 89.9 89.2 91.2  PLT 371 353 357 326   Cardiac Enzymes: No results for input(s): CKTOTAL, CKMB, CKMBINDEX, TROPONINI in the last 168 hours. BNP (last 3 results) No results for input(s): BNP in the last 8760 hours.  ProBNP (last 3 results)  Recent Labs  10/05/14 0521  PROBNP 20673.0*    CBG:  Recent Labs Lab 02/09/15 1204 02/09/15 1614 02/09/15 2104 02/09/15 2342 02/10/15 0404  GLUCAP 159* 113* 121* 111* 123*    Recent Results (from the past 240 hour(s))  Culture, bal-quantitative     Status: None   Collection Time: 01/31/15  3:11 PM  Result Value Ref Range Status   Specimen Description BRONCHIAL ALVEOLAR LAVAGE  Final   Special Requests Immunocompromised  Final   Gram Stain   Final    FEW WBC PRESENT,BOTH PMN AND MONONUCLEAR RARE SQUAMOUS EPITHELIAL CELLS PRESENT NO ORGANISMS SEEN Performed at [facility]   Final    20,OOO COLONIES/ML Performed at  [facility]    Culture   Final    CANDIDA ALBICANS Performed at [facility]    Report Status 02/03/2015 FINAL  Final  CSF culture     Status: None   Collection Time: 02/01/15 12:54 PM  Result Value Ref Range Status   Specimen Description CSF  Final   Special Requests Immunocompromised  Final   Gram Stain   Final    CYTOSPIN SLIDE WBC PRESENT, PREDOMINANTLY MONONUCLEAR NO ORGANISMS SEEN Performed at [facility] Performed at [facility]    Culture   Final    NO GROWTH 3 DAYS Performed at [facility]    Report Status 02/05/2015 FINAL  Final  Gram stain -  STAT with CSF culture     Status: None   Collection Time: 02/01/15 12:54 PM  Result Value Ref Range Status   Specimen Description CSF  Final   Special Requests Immunocompromised  Final   Gram Stain   Final    WBC PRESENT, PREDOMINANTLY MONONUCLEAR NO ORGANISMS SEEN CSF CYTOSPIN    Report Status 02/01/2015 FINAL  Final       Scheduled Meds:  Scheduled Meds: . antiseptic oral rinse  7 mL Mouth Rinse q12n4p  . chlorhexidine  15 mL Mouth Rinse BID  . darbepoetin (ARANESP) injection - DIALYSIS  150 mcg Intravenous Q Tue-HD  . folic acid  1 mg Oral Daily  . heparin subcutaneous  5,000 Units Subcutaneous 3 times per day  . hydroxychloroquine  400 mg Oral Daily  . labetalol  250 mg Per Tube 3 times per day  . multivitamin  1 tablet Oral QHS  . mycophenolate  500 mg Oral BID  . neomycin-bacitracin-polymyxin   Topical TID  . pantoprazole sodium  40 mg Per Tube QHS  . thiamine  100 mg Oral Daily    Time spent on care of this patient: 25 mins   Eulogio Bear , DO  [facility] Office  [phone] Pager - [phone]  On-Call/Text Page:      [email or web address]      password [email or web address]  If 7PM-7AM, please contact night-coverage [email or web address] Password [email or web address] 02/10/2015, 12:35 PM   LOS: 22 days

## 2015-02-10 NOTE — Procedures (Signed)
Objective Swallowing Evaluation: Modified Barium Swallowing Study  Patient Details  Name: [name] [name] MRN: [id number] Date of Birth: 08-26-1961  Today's Date: 02/10/2015 Time: SLP Start Time (ACUTE ONLY): 1346-SLP Stop Time (ACUTE ONLY): 1356 SLP Time Calculation (min) (ACUTE ONLY): 10 min  Past Medical History:  Past Medical History  Diagnosis Date  . Hypertension   . Lupus   . CKD (chronic kidney disease)     due to lupus/Dr. [name]  . Diabetes mellitus   . Stenosis of cervical spine region     with HNP at C5/6, C6/7  . Metatarsal bone fracture right    4th  . 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 Arthritis     RA  . Anemia   . Stroke 11/2014    left sided weakness, dysphagia  . Pain in joints   . Paresthesias   . Hyperlipidemia   . Colitis     ? per hospital notes 11/2014  . Lower GI bleed   . Hyponatremia   . Metabolic acidosis   . Hyperplastic colon polyp   . Hemorrhoids   . Blood transfusion without reported diagnosis    Past Surgical History:  Past Surgical History  Procedure Laterality Date  . Breast biopsy    . Tubal ligation    . Insertion of dialysis catheter Right 02/05/2015    Procedure: INSERTION OF DIALYSIS CATHETER - RIGHT INTERNAL JUGULAR;  Surgeon: Conrad Petersburg, MD;  Location: [facility] OR;  Service: Vascular;  Laterality: Right;   HPI:  HPI: 54 y.o. female with PMH: lupus, CKD. DM, CVA (pons discharged on regular diet and thin 11/2014), lower GI bleed admitted with with shortness of breath. Pt experienced acute hypoxic respiratory failure on 3/20, requiring intubation; extubated 3/29. She required reintubation 3/30-4/4. Bedside swallow 01/30/15 completed revealing multiple s/s aspiration , reduced hyolaryngeal excursion. FEES 10/04/14 showed penetration of thin liquids during consumption of large, consecutive boluses. Pt with adequate sensation, and spontaneously cleared throat, which effectively expelled  penetrate.CXR continued improvement in left perihilar airspace opacification. FEES recommended following BSE.  No Data Recorded  Assessment / Plan / Recommendation CHL IP CLINICAL IMPRESSIONS 02/10/2015  Dysphagia Diagnosis Mild oral phase dysphagia;Moderate pharyngeal phase dysphagia  Clinical impression MBSS was completed.  Pt continues to present with sensoimotor based mild oral and moderate pharyngeal dysphagia.   When compared to the FEES study there appears to be some improvement as pharyngeal residue was not noted during MBS.  The pt has very mild oral discoordination with both the honey and puree boluses.  During the pharyngeal phase the following was observed:  delayed swallow initiation, poor hyo-laryngeal excursion and airway closure.  Initially the chin tuck was effective to prevent the penetration of only the puree bolus and the pt very easily followed the directions to correctly perform the chin tuck.  However, the pt quickly fatigued and the chin tuck did not continue to  prevent the penetration.  Recommend continue to PANDA feeds and continue with ST tx to strengthen the pt's swallowing mechanism.  The pt was given exercises to work on independently and instructed to work as hard as she could in her other therapies to regain her overall strength.        CHL IP TREATMENT RECOMMENDATION 02/10/2015  Treatment Plan Recommendations Therapy as outlined in treatment plan below     CHL IP DIET RECOMMENDATION 02/10/2015  Diet Recommendations NPO  Liquid Administration via (None)  Medication Administration Via alternative means  Compensations (None)  Postural Changes and/or Swallow Maneuvers (None)     CHL IP OTHER RECOMMENDATIONS 02/10/2015  Recommended Consults (None)  Oral Care Recommendations Oral care Q4 per protocol  Other Recommendations Have oral suction available     CHL IP FOLLOW UP RECOMMENDATIONS 02/10/2015  Follow up Recommendations Inpatient Rehab     CHL IP FREQUENCY AND  DURATION 02/10/2015  Speech Therapy Frequency (ACUTE ONLY) min 2x/week  Treatment Duration 2 weeks         SLP Swallow Goals No flowsheet data found.  No flowsheet data found.    CHL IP REASON FOR REFERRAL 02/10/2015  Reason for Referral Objectively evaluate swallowing function     CHL IP ORAL PHASE 02/10/2015  Lips (None)  Tongue (None)  Mucous membranes (None)  Nutritional status (None)  Other (None)  Oxygen therapy (None)  Oral Phase Impaired  Oral - Pudding Teaspoon (None)  Oral - Pudding Cup (None)  Oral - Honey Teaspoon Weak lingual manipulation  Oral - Honey Cup (None)  Oral - Honey Syringe (None)  Oral - Nectar Teaspoon (None)  Oral - Nectar Cup (None)  Oral - Nectar Straw (None)  Oral - Nectar Syringe (None)  Oral - Ice Chips (None)  Oral - Thin Teaspoon (None)  Oral - Thin Cup (None)  Oral - Thin Straw (None)  Oral - Thin Syringe (None)  Oral - Puree Weak lingual manipulation  Oral - Mechanical Soft (None)  Oral - Regular (None)  Oral - Multi-consistency (None)  Oral - Pill (None)  Oral Phase - Comment Pt mildly weak with mild issues manipulating the bolus.      CHL IP PHARYNGEAL PHASE 02/10/2015  Pharyngeal Phase Impaired  Pharyngeal - Pudding Teaspoon (None)  Penetration/Aspiration details (pudding teaspoon) (None)  Pharyngeal - Pudding Cup (None)  Penetration/Aspiration details (pudding cup) (None)  Pharyngeal - Honey Teaspoon Delayed swallow initiation;Premature spillage to valleculae;Reduced epiglottic inversion;Reduced laryngeal elevation;Reduced airway/laryngeal closure;Penetration/Aspiration during swallow;Compensatory strategies attempted (Comment)  Penetration/Aspiration details (honey teaspoon) Material enters airway, remains ABOVE vocal cords and not ejected out  Pharyngeal - Honey Cup NT  Penetration/Aspiration details (honey cup) (None)  Pharyngeal - Honey Syringe (None)  Penetration/Aspiration details (honey syringe) (None)  Pharyngeal - Nectar  Teaspoon NT  Penetration/Aspiration details (nectar teaspoon) (None)  Pharyngeal - Nectar Cup (None)  Penetration/Aspiration details (nectar cup) (None)  Pharyngeal - Nectar Straw (None)  Penetration/Aspiration details (nectar straw) (None)  Pharyngeal - Nectar Syringe (None)  Penetration/Aspiration details (nectar syringe) (None)  Pharyngeal - Ice Chips (None)  Penetration/Aspiration details (ice chips) (None)  Pharyngeal - Thin Teaspoon (None)  Penetration/Aspiration details (thin teaspoon) (None)  Pharyngeal - Thin Cup (None)  Penetration/Aspiration details (thin cup) (None)  Pharyngeal - Thin Straw (None)  Penetration/Aspiration details (thin straw) (None)  Pharyngeal - Thin Syringe (None)  Penetration/Aspiration details (thin syringe') (None)  Pharyngeal - Puree Delayed swallow initiation;Premature spillage to valleculae;Reduced epiglottic inversion;Reduced laryngeal elevation;Reduced airway/laryngeal closure;Penetration/Aspiration during swallow;Compensatory strategies attempted (Comment)  Penetration/Aspiration details (puree) Material enters airway, remains ABOVE vocal cords and not ejected out  Pharyngeal - Mechanical Soft (None)  Penetration/Aspiration details (mechanical soft) (None)  Pharyngeal - Regular (None)  Penetration/Aspiration details (regular) (None)  Pharyngeal - Multi-consistency (None)  Penetration/Aspiration details (multi-consistency) (None)  Pharyngeal - Pill (None)  Penetration/Aspiration details (pill) (None)  Pharyngeal Comment Pt quickly fatigued and what was safe quickly became unsafe.       CHL IP CERVICAL ESOPHAGEAL PHASE 02/10/2015  Cervical Esophageal Phase WFL  Pudding Teaspoon (None)  Pudding Cup (None)  Honey Teaspoon (None)  Honey Cup (None)  Honey Syringe (None)  Nectar Teaspoon (None)  Nectar Cup (None)  Nectar Straw (None)  Nectar Syringe (None)  Thin Teaspoon (None)  Thin Cup (None)  Thin Straw (None)  Thin Syringe (None)   Cervical Esophageal Comment (None)    No flowsheet data found.         Lamar Sprinkles 02/10/2015, 2:10 PM  Shelly Flatten, [place] [place] [facility] SLP [phone]

## 2015-02-10 NOTE — Procedures (Signed)
Pt seen on HD.  Ap 180 Vp 100.  BFR 350. No new CO.   Says she is to have swallowing study today.

## 2015-02-10 NOTE — Clinical Social Work Note (Signed)
CSW attempted to meet with patient, however patient was gone for swallow study per RN Gwenlyn Perking. CSW to follow tomorrow.  [place], [place] Weekend Clinical Social Worker [phone]

## 2015-02-11 LAB — GLUCOSE, CAPILLARY
GLUCOSE-CAPILLARY: 129 mg/dL — AB (ref 70–99)
Glucose-Capillary: 122 mg/dL — ABNORMAL HIGH (ref 70–99)
Glucose-Capillary: 123 mg/dL — ABNORMAL HIGH (ref 70–99)
Glucose-Capillary: 109 mg/dL — ABNORMAL HIGH (ref 70–99)
Glucose-Capillary: 119 mg/dL — ABNORMAL HIGH (ref 70–99)

## 2015-02-11 LAB — RENAL FUNCTION PANEL
ALBUMIN: 2.5 g/dL — AB (ref 3.5–5.2)
ANION GAP: 13 (ref 5–15)
BUN: 18 mg/dL (ref 6–23)
CALCIUM: 9.8 mg/dL (ref 8.4–10.5)
CO2: 23 mmol/L (ref 19–32)
Chloride: 98 mmol/L (ref 96–112)
Creatinine, Ser: 2.91 mg/dL — ABNORMAL HIGH (ref 0.50–1.10)
GFR calc non Af Amer: 17 mL/min — ABNORMAL LOW (ref >90)
GFR, EST AFRICAN AMERICAN: 20 mL/min — AB (ref >90)
Glucose, Bld: 151 mg/dL — ABNORMAL HIGH (ref 70–99)
PHOSPHORUS: 3.1 mg/dL (ref 2.3–4.6)
Potassium: 3.7 mmol/L (ref 3.5–5.1)
Sodium: 134 mmol/L — ABNORMAL LOW (ref 135–145)

## 2015-02-11 MED ORDER — HYDROXYCHLOROQUINE SULFATE 200 MG PO TABS
200.0000 mg | ORAL_TABLET | Freq: Two times a day (BID) | ORAL | Status: DC
  Administered 2015-02-11 – 2015-02-15 (×8): 200 mg via ORAL
  Filled 2015-02-11 (×10): qty 1

## 2015-02-11 NOTE — Progress Notes (Signed)
S: No new CO.  Says she is getting out of bed? O:BP 152/64 mmHg  Pulse 116  Temp(Src) 98.9 F (37.2 C) (Oral)  Resp 17  Ht 5\' 5"  (1.651 m)  Wt 52.3 kg (115 lb 4.8 oz)  BMI 19.19 kg/m2  SpO2 97%  LMP 07/18/2012  Intake/Output Summary (Last 24 hours) at 02/11/15 0854 Last data filed at 02/10/15 2154  Gross per 24 hour  Intake      0 ml  Output   1450 ml  Net  -1450 ml   Weight change: -3.2 kg (-7 lb 0.9 oz) Gen: Awake and alert.  NG feeding tube in place CVS: RRR Resp: Clear Abd: + BS NTND No HSM Ext: No edema NEURO: CNI Ox3 no asterixis Rt IJ permcath.     Marland Kitchen antiseptic oral rinse  7 mL Mouth Rinse q12n4p  . chlorhexidine  15 mL Mouth Rinse BID  . darbepoetin (ARANESP) injection - DIALYSIS  150 mcg Intravenous Q Tue-HD  . folic acid  1 mg Oral Daily  . heparin subcutaneous  5,000 Units Subcutaneous 3 times per day  . hydroxychloroquine  400 mg Oral Daily  . labetalol  250 mg Per Tube 3 times per day  . multivitamin  1 tablet Oral QHS  . mycophenolate  500 mg Oral BID  . neomycin-bacitracin-polymyxin   Topical TID  . pantoprazole sodium  40 mg Per Tube QHS  . thiamine  100 mg Oral Daily   No results found. BMET    Component Value Date/Time   NA 135 02/10/2015 0738   K 4.0 02/10/2015 0738   CL 95* 02/10/2015 0738   CO2 31 02/10/2015 0738   GLUCOSE 139* 02/10/2015 0738   BUN 29* 02/10/2015 0738   CREATININE 4.08* 02/10/2015 0738   CREATININE 3.49* 11/10/2014 1822   CREATININE 0.35* 10/23/2008 2300   CALCIUM 9.8 02/10/2015 0738   CALCIUM 8.9 10/10/2014 1931   GFRNONAA 12* 02/10/2015 0738   GFRAA 13* 02/10/2015 0738   CBC    Component Value Date/Time   WBC 6.9 02/08/2015 1020   WBC 5.5 03/01/2012 1128   RBC 2.84* 02/08/2015 1020   RBC 3.56* 03/01/2012 1128   HGB 7.9* 02/08/2015 1020   HGB 9.0* 03/01/2012 1128   HCT 25.9* 02/08/2015 1020   HCT 29.3* 03/01/2012 1128   PLT 326 02/08/2015 1020   MCV 91.2 02/08/2015 1020   MCV 82.4 03/01/2012 1128   MCH  27.8 02/08/2015 1020   MCH 25.3* 03/01/2012 1128   MCHC 30.5 02/08/2015 1020   MCHC 30.7* 03/01/2012 1128   RDW 17.6* 02/08/2015 1020   LYMPHSABS 0.6* 11/18/2014 2324   MONOABS 0.4 11/18/2014 2324   EOSABS 0.0 11/18/2014 2324   BASOSABS 0.0 11/18/2014 2324     Assessment: 1. 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 Anemia on aranesp 3. 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 SLE  Plan: 1. HD tues 2. Working on [employer] T

## 2015-02-11 NOTE — Progress Notes (Signed)
Progress Note  GARRY BOCHICCHIO MCN:[id number] DOB: [birth year] DOA: 01/18/2015 PCP: Lamar Blinks, MD  Admit HPI / Brief Narrative: 54 y/o female with lupus (on cellcept, plaquenil) and associated CKD, DM, and HTN who presented 3/17 with vague c/o SOB, diarrhea, malaise, weakness, and cough. She was admitted by [facility] and r/o for flu, and was undergoing further w/u for diarrhea, mildly elevated troponin and anemia. Overnight 3/19 she developed intermittent SVT, then persistent tachycardia with HR 150's and worsening SOB and hypoxia requiring NRB. PCCM consulted 3/20. She required ETT/MV 3/20. CXR with B infiltrates. Initiated CVVHD on 3/21.  HPI/Subjective: Did better on swallow eval yesterday but still NPO as not safe  Assessment/Plan: Acute hypoxic respiratory failure - acute pulmonary edema in setting HTN and renal failure infiltrates have completely cleared -sats 96% on RA - resolved   SVT/sinus tachycardia HR 150 at time of her acute decompensation, Adenosine given, appeared to be sinus with some non-conducted p-waves - presumed reactive to critical illness - PE was questioned butBLE venous dopplers negative; doubt PE (heparin gtt stopped 3/23) - still having intermittent tachy, but not as severe - TSH normal - AM cortisol ok  Hypotension BP has now normalized   Newly diagnosed Diastolic CHF -EF 62-94% with MOD aortic, tricuspid and mitral regurg - volume management per HD  -Inc Labetalol to 250 mg TID  Acute on chronic renal failure, presume lupus nephritis -As per Nephrology - ongoing HD - plan for AVF as improves further   SLE -continue Cellcept and Plaq  Anemia -s/p 1 unit PRBC 3/22 - Hgb presently stable   Hyponatremia  -Corrected w/ HD   Metabolic acidosis -Corrected w/ HD   Diarrhea / Abdom pain  flexiseal out  Acute encephalopathy - fluctuating -Head Ct neg 3/27 - EEG 7/65 metabolic enceph - MRI 4/65 > Remote RIGHT inferior brainstem hemorrhage,  stable RIGHT basal ganglia micro hemorrhage, Remote small RIGHT inferior cerebellar infarct - CSF 3/31 neg for lupus cerebri - Neuro signed off on 4/3  Protein Calorie Malnutrition  Tube feeds presently - NPO since procedure on 4/6 -SLP to re-eval    Code Status: FULL Family Communication: patient Disposition Plan: CIR?    Consultants: Dr Fleet Contras. (Nephrology) Barry Dienes (Neurology) Dr.David B Simonds [facility] Jerilynn Mages)   Dr.Brian Starlyn Skeans (vascular surgery)   Procedure/Significant Events: 3/19 TTE [id number]%, grade 1 diastolic dysfunction, mod AR, mod MR, mod TR. No pericardial effusion 3/20 venous duplex B LE - negative 3/20 intubated  3/21 Adenosine challenge > sinus rhythm with some non-conducted p-waves 3/23 CVVHD, I/O even last 24 h, tachycardia improved, fent + propofol 3/24 high residuals, on max dose fentanyl. Rx'd with Reglan  3/26 continued agitation with WUA / SBT, changed back to propofol/fentanyl  3/28 seroquel trance, neuro consult 3/29 more awake, follows commands - extubated  3/30 reintubated, bronchoscopy - for LLL atx 4/03 fentanyl drip stopped 4/04 tunneled cath placed 4/04 extubated    Culture   Antibiotics:   DVT prophylaxis: Heparin SQ   Devices    LINES / TUBES:      Continuous Infusions: . feeding supplement (VITAL AF 1.2 CAL) 1,000 mL (02/10/15 1259)    Objective: VITAL SIGNS: Temp: 98.3 F (36.8 C) (04/10 0943) Temp Source: Oral (04/10 0943) BP: 121/59 mmHg (04/10 0943) Pulse Rate: 109 (04/10 0943)    Intake/Output Summary (Last 24 hours) at 02/11/15 1058 Last data filed at 02/11/15 0932  Gross per 24 hour  Intake      0  ml  Output   1450 ml  Net  -1450 ml     Exam: General: NG tube in place Lungs: Clear, no wheezing Cardiovascular: rrr Abdomen: +BS, soft Extremities: no edema  Data Reviewed: Basic Metabolic Panel:  Recent Labs Lab 02/05/15 0230 02/06/15 0447 02/07/15 0525  02/08/15 1020 02/09/15 0441 02/10/15 0738  NA 138 140 140 138 136 135  K 3.8 3.7 3.6 3.9 3.6 4.0  CL 103 106 102 101 96 95*  CO2 23 24 28 24 29 31   GLUCOSE 149* 110* 146* 152* 135* 139*  BUN 43* 48* 13 30* 12 29*  CREATININE 5.38* 6.59* 2.79* 4.52* 2.44* 4.08*  CALCIUM 9.4 9.5 9.5 9.8 9.9 9.8  MG 2.0  --   --   --   --   --   PHOS 3.3  3.3 4.0 2.1* 2.3 2.3 2.9   Liver Function Tests:  Recent Labs Lab 02/06/15 0447 02/07/15 0525 02/08/15 1020 02/09/15 0441 02/10/15 0738  ALBUMIN 2.0* 2.2* 2.3* 2.4* 2.5*   No results for input(s): LIPASE, AMYLASE in the last 168 hours. No results for input(s): AMMONIA in the last 168 hours. CBC:  Recent Labs Lab 02/05/15 0230 02/06/15 0447 02/08/15 1020  WBC 6.8 6.4 6.9  HGB 7.5* 7.7* 7.9*  HCT 24.0* 24.9* 25.9*  MCV 89.9 89.2 91.2  PLT 353 357 326   Cardiac Enzymes: No results for input(s): CKTOTAL, CKMB, CKMBINDEX, TROPONINI in the last 168 hours. BNP (last 3 results) No results for input(s): BNP in the last 8760 hours.  ProBNP (last 3 results)  Recent Labs  10/05/14 0521  PROBNP 20673.0*    CBG:  Recent Labs Lab 02/10/15 1639 02/10/15 2013 02/10/15 2353 02/11/15 0418 02/11/15 0723  GLUCAP 91 118* 125* 122* 129*    Recent Results (from the past 240 hour(s))  CSF culture     Status: None   Collection Time: 02/01/15 12:54 PM  Result Value Ref Range Status   Specimen Description CSF  Final   Special Requests Immunocompromised  Final   Gram Stain   Final    CYTOSPIN SLIDE WBC PRESENT, PREDOMINANTLY MONONUCLEAR NO ORGANISMS SEEN Performed at [facility] Performed at [facility]    Culture   Final    NO GROWTH 3 DAYS Performed at [facility]    Report Status 02/05/2015 FINAL  Final  Gram stain - STAT with CSF culture     Status: None   Collection Time: 02/01/15 12:54 PM  Result Value Ref Range Status   Specimen Description CSF  Final   Special Requests Immunocompromised  Final    Gram Stain   Final    WBC PRESENT, PREDOMINANTLY MONONUCLEAR NO ORGANISMS SEEN CSF CYTOSPIN    Report Status 02/01/2015 FINAL  Final       Scheduled Meds:  Scheduled Meds: . antiseptic oral rinse  7 mL Mouth Rinse q12n4p  . chlorhexidine  15 mL Mouth Rinse BID  . darbepoetin (ARANESP) injection - DIALYSIS  150 mcg Intravenous Q Tue-HD  . folic acid  1 mg Oral Daily  . heparin subcutaneous  5,000 Units Subcutaneous 3 times per day  . hydroxychloroquine  200 mg Oral BID  . labetalol  250 mg Per Tube 3 times per day  . multivitamin  1 tablet Oral QHS  . mycophenolate  500 mg Oral BID  . neomycin-bacitracin-polymyxin   Topical TID  . pantoprazole sodium  40 mg Per Tube QHS  . thiamine  100 mg  Oral Daily    Time spent on care of this patient: 25 mins   Eulogio Bear , DO  [facility] Office  [phone] Pager - [phone]  On-Call/Text Page:      [email or web address]      password [email or web address]  If 7PM-7AM, please contact night-coverage [email or web address] Password [email or web address] 02/11/2015, 10:58 AM   LOS: 23 days

## 2015-02-11 NOTE — Clinical Social Work Note (Signed)
CSW met with patient and introduced self. CSW discussed consult regarding list of resources for financial assistance. Per patient, she is unaware of request for list of resources for financial assistance. Patient states her sister, Mechele Claude is the one who requested the information and it is probably related to Obamacare, but she does not know. Patient requests CSW contact her sister for additional information. CSW contacted Mechele Claude [phone]) and left a message for follow-up. CSW to continue to follow.  [place], [place] Weekend Clinical Social Worker [phone]

## 2015-02-12 ENCOUNTER — Inpatient Hospital Stay (HOSPITAL_COMMUNITY): Payer: Managed Care, Other (non HMO)

## 2015-02-12 DIAGNOSIS — R5381 Other malaise: Secondary | ICD-10-CM

## 2015-02-12 LAB — RENAL FUNCTION PANEL
ANION GAP: 11 (ref 5–15)
Albumin: 2.5 g/dL — ABNORMAL LOW (ref 3.5–5.2)
BUN: 33 mg/dL — AB (ref 6–23)
CO2: 29 mmol/L (ref 19–32)
Calcium: 9.9 mg/dL (ref 8.4–10.5)
Chloride: 94 mmol/L — ABNORMAL LOW (ref 96–112)
Creatinine, Ser: 4.08 mg/dL — ABNORMAL HIGH (ref 0.50–1.10)
GFR calc non Af Amer: 12 mL/min — ABNORMAL LOW (ref >90)
GFR, EST AFRICAN AMERICAN: 13 mL/min — AB (ref >90)
GLUCOSE: 126 mg/dL — AB (ref 70–99)
PHOSPHORUS: 3.9 mg/dL (ref 2.3–4.6)
POTASSIUM: 4 mmol/L (ref 3.5–5.1)
Sodium: 134 mmol/L — ABNORMAL LOW (ref 135–145)

## 2015-02-12 LAB — GLUCOSE, CAPILLARY
Glucose-Capillary: 101 mg/dL — ABNORMAL HIGH (ref 70–99)
Glucose-Capillary: 111 mg/dL — ABNORMAL HIGH (ref 70–99)
Glucose-Capillary: 113 mg/dL — ABNORMAL HIGH (ref 70–99)
Glucose-Capillary: 121 mg/dL — ABNORMAL HIGH (ref 70–99)
Glucose-Capillary: 127 mg/dL — ABNORMAL HIGH (ref 70–99)
Glucose-Capillary: 137 mg/dL — ABNORMAL HIGH (ref 70–99)

## 2015-02-12 MED ORDER — GABAPENTIN 100 MG PO CAPS
100.0000 mg | ORAL_CAPSULE | Freq: Once | ORAL | Status: AC
  Administered 2015-02-12: 100 mg via ORAL
  Filled 2015-02-12: qty 1

## 2015-02-12 NOTE — Progress Notes (Signed)
Inpatient Rehabilitation  We  continue to monitor for pt. progress in therapies as well as medical readiness.  Note that pt. did well with OT/PT today.  Await insurance decision on possible CIR admission pending bed availability, medical readiness and necessity for IP rehab based on functional progress.  Please call if questions.  [place] Admissions Coordinator Cell [phone] Office [phone]

## 2015-02-12 NOTE — Progress Notes (Signed)
Occupational Therapy Treatment Patient Details Name: NATASHIA ROSEMAN MRN: [id number] DOB: 1961-03-17 Today's Date: 02/12/2015    History of present illness Pt is a 54 y.o. female presents with SOB on 01/18/15. In addition she had labwork in the ED and this shows significant anemia. Patient has been having anemia for some time. She was recently in the hospital for the anemia but a workup was not conclusive. Also she had a minimally elevated troponin level. Pt required NRB on 3/19 and CVVHD was initiated on 3/21.    OT comments  Patient progressing nicely towards OT goals, continue plan of care for now. Patient continues to be limited by decreased strength and endurance, which limits her ability to be as independent as possible. Patient with overall decreased safety awareness and will continue to benefit from skilled therapy.   Follow Up Recommendations  CIR;Supervision/Assistance - 24 hour    Equipment Recommendations   (TBD next venue)    Recommendations for Other Services Rehab consult    Precautions / Restrictions Precautions Precautions: None Restrictions Weight Bearing Restrictions: No       Mobility - Per PT note Bed Mobility Overal bed mobility: Needs Assistance Bed Mobility: Supine to Sit     Supine to sit: Min guard (HOB was already elevated upon arrival)     General bed mobility comments: Min guard for safety. Pt did not require tactile assist from SPT during bed mobility; however did require verbal cues to roll to side and position hips evenly at edge of bed.  Transfers Overall transfer level: Needs assistance Equipment used: Rolling walker (2 wheeled) Transfers: Sit to/from Stand Sit to Stand: Min guard         General transfer comment: Min guard for safety. Verbal cues to push from bed with hands and then use walker.    Balance Overall balance assessment: Needs assistance Sitting-balance support: No upper extremity supported;Feet supported Sitting  balance-Leahy Scale: Good     Standing balance support: Bilateral upper extremity supported;During functional activity Standing balance-Leahy Scale: Fair    ADL General ADL Comments: Patient completed UB/LB bathing in sit<>stand position from recliner. Patient required min verbal cues for safety with use of RW and therapist reiterated importance of using RW during all transfers for safety at this time. Educated patient and NT on importance of patient ambulating <> BR for toilet transfers and toielting needs to help patient regain her strength, endurance, and independence.      Cognition   Behavior During Therapy: WFL for tasks assessed/performed Overall Cognitive Status: Within Functional Limits for tasks assessed                 Pertinent Vitals/ Pain       Pain Assessment: No/denies pain         Frequency Min 2X/week     Progress Toward Goals  OT Goals(current goals can now be found in the care plan section)  Progress towards OT goals: Progressing toward goals     Plan Discharge plan remains appropriate       End of Session Equipment Utilized During Treatment: Rolling walker   Activity Tolerance Patient tolerated treatment well   Patient Left in chair;with call bell/phone within reach;with family/visitor present     Time: 8101-7510 OT Time Calculation (min): 15 min  Charges: OT General Charges $OT Visit: 1 Procedure OT Treatments $Self Care/Home Management : 8-22 mins  Atif Chapple , MS, OTR/L, CLT Pager: [phone]  02/12/2015, 12:24 PM

## 2015-02-12 NOTE — Progress Notes (Signed)
Progress Note  [name] [name] GEX:[id number] DOB: [birth year] DOA: 01/18/2015 PCP: Lamar Blinks, MD  Admit HPI / Brief Narrative: 54 y/o female with lupus (on cellcept, plaquenil) and associated CKD, DM, and HTN who presented 3/17 with vague c/o SOB, diarrhea, malaise, weakness, and cough. She was admitted by [facility] and r/o for flu, and was undergoing further w/u for diarrhea, mildly elevated troponin and anemia. Overnight 3/19 she developed intermittent SVT, then persistent tachycardia with HR 150's and worsening SOB and hypoxia requiring NRB. PCCM consulted 3/20. She required ETT/MV 3/20. CXR with B infiltrates. Initiated CVVHD on 3/21.  HPI/Subjective: Resting No pain  Assessment/Plan: Acute hypoxic respiratory failure - acute pulmonary edema in setting HTN and renal failure infiltrates have completely cleared -sats 96% on RA - resolved   SVT/sinus tachycardia HR 150 at time of her acute decompensation, Adenosine given, appeared to be sinus with some non-conducted p-waves - presumed reactive to critical illness - PE was questioned butBLE venous dopplers negative; doubt PE (heparin gtt stopped 3/23) - still having intermittent tachy, but not as severe - TSH normal - AM cortisol ok  Hypotension BP has now normalized   Newly diagnosed Diastolic CHF -EF 01-02% with MOD aortic, tricuspid and mitral regurg - volume management per HD  -Inc Labetalol to 250 mg TID  Acute on chronic renal failure, presume lupus nephritis -As per Nephrology - ongoing HD - plan for AVF as improves further   SLE -continue Cellcept and Plaq  Anemia -s/p 1 unit PRBC 3/22 - Hgb presently stable   Hyponatremia  -Corrected w/ HD   Metabolic acidosis -Corrected w/ HD   Diarrhea / Abdom pain  flexiseal out  Acute encephalopathy - fluctuating -Head Ct neg 3/27 - EEG 7/25 metabolic enceph - MRI 3/66 > Remote RIGHT inferior brainstem hemorrhage, stable RIGHT basal ganglia micro hemorrhage,  Remote small RIGHT inferior cerebellar infarct - CSF 3/31 neg for lupus cerebri - Neuro signed off on 4/3  Protein Calorie Malnutrition  Tube feeds presently - NPO since procedure on 4/6 -SLP to re-eval    Code Status: FULL Family Communication: no family at bedside Disposition Plan: CIR?    Consultants: Dr Fleet Contras. (Nephrology) Barry Dienes (Neurology) Dr.David B Simonds [facility] Jerilynn Mages)   Dr.Brian Starlyn Skeans (vascular surgery)   Procedure/Significant Events: 3/19 TTE [id number]%, grade 1 diastolic dysfunction, mod AR, mod MR, mod TR. No pericardial effusion 3/20 venous duplex B LE - negative 3/20 intubated  3/21 Adenosine challenge > sinus rhythm with some non-conducted p-waves 3/23 CVVHD, I/O even last 24 h, tachycardia improved, fent + propofol 3/24 high residuals, on max dose fentanyl. Rx'd with Reglan  3/26 continued agitation with WUA / SBT, changed back to propofol/fentanyl  3/28 seroquel trance, neuro consult 3/29 more awake, follows commands - extubated  3/30 reintubated, bronchoscopy - for LLL atx 4/03 fentanyl drip stopped 4/04 tunneled cath placed 4/04 extubated    Culture   Antibiotics:   DVT prophylaxis: Heparin SQ   Devices    LINES / TUBES:      Continuous Infusions: . feeding supplement (VITAL AF 1.2 CAL) 1,000 mL (02/10/15 1259)    Objective: VITAL SIGNS: Temp: 98 F (36.7 C) (04/11 0437) Temp Source: Oral (04/11 0437) BP: 137/67 mmHg (04/11 0437) Pulse Rate: 104 (04/11 0437)    Intake/Output Summary (Last 24 hours) at 02/12/15 0807 Last data filed at 02/11/15 2116  Gross per 24 hour  Intake    200 ml  Output     50  ml  Net    150 ml     Exam: General: NG tube in place- ulcer under on left side Lungs: Clear, no wheezing Cardiovascular: rrr Abdomen: +BS, soft Extremities: no edema  Data Reviewed: Basic Metabolic Panel:  Recent Labs Lab 02/08/15 1020 02/09/15 0441 02/10/15 0738  02/11/15 0912 02/12/15 0430  NA 138 136 135 134* 134*  K 3.9 3.6 4.0 3.7 4.0  CL 101 96 95* 98 94*  CO2 24 29 31 23 29   GLUCOSE 152* 135* 139* 151* 126*  BUN 30* 12 29* 18 33*  CREATININE 4.52* 2.44* 4.08* 2.91* 4.08*  CALCIUM 9.8 9.9 9.8 9.8 9.9  PHOS 2.3 2.3 2.9 3.1 3.9   Liver Function Tests:  Recent Labs Lab 02/08/15 1020 02/09/15 0441 02/10/15 0738 02/11/15 0912 02/12/15 0430  ALBUMIN 2.3* 2.4* 2.5* 2.5* 2.5*   No results for input(s): LIPASE, AMYLASE in the last 168 hours. No results for input(s): AMMONIA in the last 168 hours. CBC:  Recent Labs Lab 02/06/15 0447 02/08/15 1020  WBC 6.4 6.9  HGB 7.7* 7.9*  HCT 24.9* 25.9*  MCV 89.2 91.2  PLT 357 326   Cardiac Enzymes: No results for input(s): CKTOTAL, CKMB, CKMBINDEX, TROPONINI in the last 168 hours. BNP (last 3 results) No results for input(s): BNP in the last 8760 hours.  ProBNP (last 3 results)  Recent Labs  10/05/14 0521  PROBNP 20673.0*    CBG:  Recent Labs Lab 02/11/15 1215 02/11/15 1639 02/11/15 1958 02/11/15 2357 02/12/15 0357  GLUCAP 123* 109* 119* 111* 113*    No results found for this or any previous visit (from the past 240 hour(s)).     Scheduled Meds:  Scheduled Meds: . antiseptic oral rinse  7 mL Mouth Rinse q12n4p  . chlorhexidine  15 mL Mouth Rinse BID  . darbepoetin (ARANESP) injection - DIALYSIS  150 mcg Intravenous Q Tue-HD  . folic acid  1 mg Oral Daily  . heparin subcutaneous  5,000 Units Subcutaneous 3 times per day  . hydroxychloroquine  200 mg Oral BID  . labetalol  250 mg Per Tube 3 times per day  . multivitamin  1 tablet Oral QHS  . mycophenolate  500 mg Oral BID  . neomycin-bacitracin-polymyxin   Topical TID  . pantoprazole sodium  40 mg Per Tube QHS  . thiamine  100 mg Oral Daily    Time spent on care of this patient: 25 mins   Eulogio Bear , DO  [facility] Office  [phone] Pager - [phone]  On-Call/Text Page:       [email or web address]      password [email or web address]  If 7PM-7AM, please contact night-coverage [email or web address] Password [email or web address] 02/12/2015, 8:07 AM   LOS: 24 days

## 2015-02-12 NOTE — Progress Notes (Signed)
Physical Therapy Treatment Patient Details Name: [name] [name] MRN: [id number] DOB: Jun 28, 1961 Today's Date: 02/12/2015    History of Present Illness Pt is a 54 y.o. female presents with SOB on 01/18/15. In addition she had labwork in the ED and this shows significant anemia. Patient has been having anemia for some time. She was recently in the hospital for the anemia but a workup was not conclusive. Also she had a minimally elevated troponin level. Pt required NRB on 3/19 and CVVHD was initiated on 3/21.     PT Comments    Pt completed tasks of bed mobility, transfers, and ambulation safely with minimal verbal or tactile cueing from SPT. However, pt did complete gait and transfers with heavy reliance on UE support, indicating continued weakness and impaired balance. Asymmetry between limbs during gait was also noted with pt favoring the R LE and decreased stance on the L LE. Pt is making progress with mobility and requires less assistance than previous PT sessions. Pt will benefit from standing balance exercises in order to increase safety during gait and improve activity tolerance. Continue to recommend CIR at discharge in order to continue progressing independence with gait and standing/dynamic balance.   Follow Up Recommendations  CIR;Supervision/Assistance - 24 hour     Equipment Recommendations  None recommended by PT    Recommendations for Other Services       Precautions / Restrictions Precautions Precautions: None Restrictions Weight Bearing Restrictions: No    Mobility  Bed Mobility Overal bed mobility: Needs Assistance Bed Mobility: Supine to Sit     Supine to sit: Min guard (HOB was already elevated upon arrival)     General bed mobility comments: Min guard for safety. Pt did not require tactile assist from SPT during bed mobility; however did require verbal cues to roll to side and position hips evenly at edge of bed.  Transfers Overall transfer level: Needs  assistance Equipment used: Rolling walker (2 wheeled) Transfers: Sit to/from Stand Sit to Stand: Min guard         General transfer comment: Min guard for safety. Verbal cues to push from bed with hands and then use walker.  Ambulation/Gait Ambulation/Gait assistance: Min guard Ambulation Distance (Feet): 100 Feet Assistive device: Rolling walker (2 wheeled) Gait Pattern/deviations: Step-through pattern;Decreased stance time - left;Decreased step length - right   Gait velocity interpretation: Below normal speed for age/gender General Gait Details: Pt used 2wRW well with minimal verbal cueing and appeared to bear weight evenly between UE's and LE's. No LOB during gait, but stayed within center of mass throughout.   Stairs            Wheelchair Mobility    Modified Rankin (Stroke Patients Only)       Balance     Sitting balance-Leahy Scale: Good     Standing balance support: During functional activity Standing balance-Leahy Scale: Fair                      Cognition Arousal/Alertness: Awake/alert Behavior During Therapy: WFL for tasks assessed/performed (Did not verbalize during session; acknowledged understanding) Overall Cognitive Status: Within Functional Limits for tasks assessed                      Exercises      General Comments        Pertinent Vitals/Pain Pain Assessment: No/denies pain    Home Living  Prior Function            PT Goals (current goals can now be found in the care plan section) Acute Rehab PT Goals PT Goal Formulation: With patient Time For Goal Achievement: 02/26/15 Potential to Achieve Goals: Good Progress towards PT goals: Progressing toward goals    Frequency  Min 3X/week    PT Plan Current plan remains appropriate    Co-evaluation             End of Session Equipment Utilized During Treatment: Gait belt Activity Tolerance: Patient tolerated treatment well;No  increased pain Patient left: in chair;Other (comment) (with SLP in room to begin session)     Time: 2094-7096 PT Time Calculation (min) (ACUTE ONLY): 16 min  Charges:                       G CodesShawna Orleans 10-Mar-2015, 11:12 AM  Shawna Orleans, SPT [place] Office: [phone]

## 2015-02-12 NOTE — Progress Notes (Signed)
Speech Language Pathology Treatment: Dysphagia  Patient Details Name: [name] [name] MRN: [id number] DOB: Nov 26, 1960 Today's Date: 02/12/2015 Time: 1040-1110 SLP Time Calculation (min) (ACUTE ONLY): 30 min  Assessment / Plan / Recommendation Clinical Impression  The patient was seen for dysphagia therapy sitting upright in the chair following PT.  The patient's sister was in the room.  Results of the MBS were reviewed with the patient's sister as well as swallowing and airway protection exercises that the patient is to work on independently.  Prior to beginning exercises the patient performed oral care.  She was encouraged to perform oral care at least 4 times per day using the suction swabs.  She was able to take in some water to swish and spit to further clean her oral cavity.   She was able to perform all requested exercises.  In addition, the patient was given 6 ice chips.  On 5/6 ice chips there was no noted change in her vocal quality or other s/s of aspiration.  On 1/6 ice chips that patient was noted to have a wet vocal quality.  She was able to clear given a cough.  Recommend continued swallowing therapy with repeat objective measure in 1-2 weeks.     HPI HPI: 54 y.o. female with PMH: lupus, CKD. DM, CVA (pons discharged on regular diet and thin 11/2014), lower GI bleed admitted with with shortness of breath. Pt experienced acute hypoxic respiratory failure on 3/20, requiring intubation; extubated 3/29. She required reintubation 3/30-4/4. Bedside swallow 01/30/15 completed revealing multiple s/s aspiration , reduced hyolaryngeal excursion. FEES 10/04/14 showed penetration of thin liquids during consumption of large, consecutive boluses. Pt with adequate sensation, and spontaneously cleared throat, which effectively expelled penetrate.CXR continued improvement in left perihilar airspace opacification. FEES recommended following BSE.   Pertinent Vitals Pain Assessment: No/denies pain      Recommendations Diet recommendations: NPO Medication Administration: Via alternative means             General recommendations: Rehab consult Oral Care Recommendations: Oral care Q4 per protocol Follow up Recommendations: Inpatient Rehab    GO     Lamar Sprinkles 02/12/2015, 11:18 AM  Shelly Flatten, [place], [facility] SLP [phone]

## 2015-02-12 NOTE — Progress Notes (Signed)
Assessment: 1. 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 Anemia on aranesp 3. 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 SLE  Plan: 1. HD tues 2. Working on outpt spot 3. Vein map and ask [facility] to see in f/u 4. Rehab if poss  Subjective: Interval History: Working with PT  Objective: Vital signs in last 24 hours: Temp:  [98 F (36.7 C)-98.8 F (37.1 C)] 98.8 F (37.1 C) (04/11 0915) Pulse Rate:  [89-104] 98 (04/11 0915) Resp:  [16-18] 16 (04/11 0915) BP: (114-137)/(52-67) 122/58 mmHg (04/11 0915) SpO2:  [99 %-100 %] 100 % (04/11 0915) Weight:  [56.7 kg (125 lb)-57.1 kg (125 lb 14.1 oz)] 57.1 kg (125 lb 14.1 oz) (04/11 0437) Weight change: 3.2 kg (7 lb 0.9 oz)  Intake/Output from previous day: 04/10 0701 - 04/11 0700 In: 200 [NG/GT:200] Out: 50 [Urine:50] Intake/Output this shift:    General appearance: alert, cooperative and weak Resp: clear to auscultation bilaterally Chest wall: no tenderness Cardio: regular rate and rhythm, S1, S2 normal, no murmur, click, rub or gallop Extremities: extremities normal, atraumatic, no cyanosis or edema  Lab Results: No results for input(s): WBC, HGB, HCT, PLT in the last 72 hours. BMET:  Recent Labs  02/11/15 0912 02/12/15 0430  NA 134* 134*  K 3.7 4.0  CL 98 94*  CO2 23 29  GLUCOSE 151* 126*  BUN 18 33*  CREATININE 2.91* 4.08*  CALCIUM 9.8 9.9   No results for input(s): PTH in the last 72 hours. Iron Studies: No results for input(s): IRON, TIBC, TRANSFERRIN, FERRITIN in the last 72 hours. Studies/Results: No results found.  Scheduled: . antiseptic oral rinse  7 mL Mouth Rinse q12n4p  . chlorhexidine  15 mL Mouth Rinse BID  . darbepoetin (ARANESP) injection - DIALYSIS  150 mcg Intravenous Q Tue-HD  . folic acid  1 mg Oral Daily  . heparin subcutaneous  5,000 Units Subcutaneous 3 times per day  . hydroxychloroquine  200 mg Oral BID  . labetalol  250 mg Per Tube 3 times per day  .  multivitamin  1 tablet Oral QHS  . mycophenolate  500 mg Oral BID  . neomycin-bacitracin-polymyxin   Topical TID  . pantoprazole sodium  40 mg Per Tube QHS  . thiamine  100 mg Oral Daily       LOS: 24 days   [name] [name] C 02/12/2015,12:01 PM

## 2015-02-12 NOTE — Consult Note (Signed)
WOC follow-up: Area below nare/above lip with previous deep tissue injury, has evolved into stage 2 wound; 1X1X.1cm, red and moist, no odor or drainage.  Continue antibiotic ointment to promote moist healing.  Discussed plan of care with patient and she verbalizes understanding. Please re-consult if further assistance is needed.  Thank-you,  Julien Girt MSN, [place], [place], [place], [place]

## 2015-02-13 LAB — GLUCOSE, CAPILLARY
GLUCOSE-CAPILLARY: 102 mg/dL — AB (ref 70–99)
GLUCOSE-CAPILLARY: 126 mg/dL — AB (ref 70–99)
Glucose-Capillary: 100 mg/dL — ABNORMAL HIGH (ref 70–99)
Glucose-Capillary: 120 mg/dL — ABNORMAL HIGH (ref 70–99)
Glucose-Capillary: 97 mg/dL (ref 70–99)
Glucose-Capillary: 98 mg/dL (ref 70–99)

## 2015-02-13 LAB — CBC
HCT: 24.6 % — ABNORMAL LOW (ref 36.0–46.0)
Hemoglobin: 7.8 g/dL — ABNORMAL LOW (ref 12.0–15.0)
MCH: 28.2 pg (ref 26.0–34.0)
MCHC: 31.7 g/dL (ref 30.0–36.0)
MCV: 88.8 fL (ref 78.0–100.0)
PLATELETS: 283 10*3/uL (ref 150–400)
RBC: 2.77 MIL/uL — ABNORMAL LOW (ref 3.87–5.11)
RDW: 18.5 % — ABNORMAL HIGH (ref 11.5–15.5)
WBC: 6.3 10*3/uL (ref 4.0–10.5)

## 2015-02-13 LAB — RENAL FUNCTION PANEL
Albumin: 2.5 g/dL — ABNORMAL LOW (ref 3.5–5.2)
Anion gap: 8 (ref 5–15)
BUN: 12 mg/dL (ref 6–23)
CO2: 29 mmol/L (ref 19–32)
Calcium: 9.1 mg/dL (ref 8.4–10.5)
Chloride: 100 mmol/L (ref 96–112)
Creatinine, Ser: 1.9 mg/dL — ABNORMAL HIGH (ref 0.50–1.10)
GFR calc non Af Amer: 29 mL/min — ABNORMAL LOW (ref >90)
GFR, EST AFRICAN AMERICAN: 34 mL/min — AB (ref >90)
Glucose, Bld: 126 mg/dL — ABNORMAL HIGH (ref 70–99)
POTASSIUM: 4.2 mmol/L (ref 3.5–5.1)
Phosphorus: 2.2 mg/dL — ABNORMAL LOW (ref 2.3–4.6)
SODIUM: 137 mmol/L (ref 135–145)

## 2015-02-13 MED ORDER — NEPRO/CARBSTEADY PO LIQD
237.0000 mL | ORAL | Status: DC | PRN
  Filled 2015-02-13: qty 237

## 2015-02-13 MED ORDER — SODIUM CHLORIDE 0.9 % IV SOLN: 100.0000 mL | INTRAVENOUS | Status: DC | PRN

## 2015-02-13 MED ORDER — DARBEPOETIN ALFA 150 MCG/0.3ML IJ SOSY
PREFILLED_SYRINGE | INTRAMUSCULAR | Status: AC
  Administered 2015-02-13: 150 ug via ARTERIOVENOUS_FISTULA
  Filled 2015-02-13: qty 0.3

## 2015-02-13 MED ORDER — NEPRO/CARBSTEADY PO LIQD
1000.0000 mL | ORAL | Status: DC
  Administered 2015-02-13 – 2015-02-15 (×3): 1000 mL
  Filled 2015-02-13 (×6): qty 1000

## 2015-02-13 MED ORDER — PENTAFLUOROPROP-TETRAFLUOROETH EX AERO: 1.0000 "application " | INHALATION_SPRAY | CUTANEOUS | Status: DC | PRN

## 2015-02-13 MED ORDER — DEXTROSE 5 % IV SOLN
1.5000 g | INTRAVENOUS | Status: AC
  Administered 2015-02-14: 1.5 g via INTRAVENOUS
  Filled 2015-02-13: qty 1.5

## 2015-02-13 MED ORDER — GABAPENTIN 100 MG PO CAPS
100.0000 mg | ORAL_CAPSULE | Freq: Once | ORAL | Status: AC
Start: 2015-02-13 — End: 2015-02-14
  Administered 2015-02-14: 100 mg via ORAL
  Filled 2015-02-13: qty 1

## 2015-02-13 MED ORDER — HEPARIN SODIUM (PORCINE) 1000 UNIT/ML DIALYSIS: 20.0000 [IU]/kg | INTRAMUSCULAR | Status: DC | PRN

## 2015-02-13 MED ORDER — GABAPENTIN 250 MG/5ML PO SOLN: 100.0000 mg | Freq: Once | ORAL | Status: DC

## 2015-02-13 MED ORDER — LIDOCAINE HCL (PF) 1 % IJ SOLN: 5.0000 mL | INTRAMUSCULAR | Status: DC | PRN

## 2015-02-13 MED ORDER — PRO-STAT SUGAR FREE PO LIQD
30.0000 mL | Freq: Every day | ORAL | Status: DC
  Administered 2015-02-13 – 2015-02-15 (×2): 30 mL
  Filled 2015-02-13 (×3): qty 30

## 2015-02-13 MED ORDER — VITAL AF 1.2 CAL PO LIQD
1000.0000 mL | ORAL | Status: DC
Start: 2015-02-13 — End: 2015-02-13
  Administered 2015-02-13: 1000 mL
  Filled 2015-02-13 (×2): qty 1000

## 2015-02-13 MED ORDER — MAGIC MOUTHWASH
5.0000 mL | Freq: Three times a day (TID) | ORAL | Status: DC | PRN
  Filled 2015-02-13: qty 5

## 2015-02-13 MED ORDER — ALTEPLASE 2 MG IJ SOLR
2.0000 mg | Freq: Once | INTRAMUSCULAR | Status: DC | PRN
  Filled 2015-02-13: qty 2

## 2015-02-13 MED ORDER — HEPARIN SODIUM (PORCINE) 1000 UNIT/ML DIALYSIS: 1000.0000 [IU] | INTRAMUSCULAR | Status: DC | PRN

## 2015-02-13 MED ORDER — FREE WATER
100.0000 mL | Freq: Three times a day (TID) | Status: DC
  Administered 2015-02-13 – 2015-02-15 (×6): 100 mL

## 2015-02-13 MED ORDER — LIDOCAINE-PRILOCAINE 2.5-2.5 % EX CREA
1.0000 "application " | TOPICAL_CREAM | CUTANEOUS | Status: DC | PRN
  Filled 2015-02-13: qty 5

## 2015-02-13 NOTE — Progress Notes (Signed)
[facility]  Patient seen in HD. She is on the schedule tomorrow for a left basilic vein transposition. The risks, benefits and procedure were discussed with the patient and she is willing to proceed. Obtain consent. Hold tube feeds past midnight.   Virgina Jock, PA-C Vascular Surgery [phone]

## 2015-02-13 NOTE — Procedures (Signed)
Tolerating HD, tachycardic therefore will not pull anymore fluid.   Assessment: 1. ESRD 2. Anemia on aranesp 3. 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 SLE  Plan: 1. HD in progress 2. Working on outpt spot 3. For BVT in AM 4. Rehab if poss

## 2015-02-13 NOTE — Progress Notes (Signed)
NUTRITION FOLLOW UP  Pt meets criteria for SEVERE MALNUTRITION in the context of ACUTE ILLNESS/INJURY as evidenced by 11% weight loss in 2 weeks and severe muscle wasting.  Intervention:   Discontinue Vital AF 1.2 formula.  Initiate Nepro formula via NGT at 20 ml/hr and increase by 10 ml every 4 hours to goal rate of 40 ml/hr with 30 ml Prostat once daily per tube to provide 1828 kcal (100% of needs), 93 grams of protein, and 701 ml of free water.  Provide free water flushes of 100 ml TID. Total water daily: 1001 ml  RD to continue to monitor.  Nutrition Dx:   Inadequate oral intake related to inability to eat as evidenced by NPO status; ongoing  Goal:   Intake to meet >90% of estimated nutrition needs; met  Monitor:   Swallowing ability, diet advancement, TF tolerance/adequacy, weight trend, labs.  Assessment:   Patient admitted with weakness and recent weight loss. History of lupus, CKD, DM. Patient with frequent diarrhea within 45 minutes after eating since starting Cellcept in November. She has lost 20 lbs in the past 3-4 months.  Patient extubated on 4/4.   Pt continues to be NPO on tube feeds and has been tolerating it at goal rate. Pt has been receiving HD treatment. Per MD note, pt may now be in ESRD and plans for left basilic vein transposition tomorrow. RD to modify tube feeding orders and switch to Nepro formula as formula is low in phosphorous, potassium, and sodium.   Will continue to monitor.   Labs and medications reviewed.  Height: Ht Readings from Last 1 Encounters:  02/07/15 5\' 5"  (1.651 m)    Weight Status:   Wt Readings from Last 1 Encounters:  02/13/15 119 lb 4.3 oz (54.1 kg)   01/24/15 134 lb 14.7 oz (61.2 kg)   01/20/15 132 lb 11.2 oz (60.192 kg)   01/19/15 124 lb 11.2 oz (56.564 kg)               Re-estimated needs:  Kcal: 1600-1850 Protein: 85-100 grams Fluid: Per MD  Skin: stage II pressure ulcer on buttocks, sacrum,  upper lip, incision on right neck  Diet Order: Diet NPO time specified Except for: Other (See Comments) Diet NPO time specified   Intake/Output Summary (Last 24 hours) at 02/13/15 1438 Last data filed at 02/13/15 1234  Gross per 24 hour  Intake      0 ml  Output   1500 ml  Net  -1500 ml    Last BM: 4/10   Labs:   Recent Labs Lab 02/10/15 0738 02/11/15 0912 02/12/15 0430  NA 135 134* 134*  K 4.0 3.7 4.0  CL 95* 98 94*  CO2 31 23 29   BUN 29* 18 33*  CREATININE 4.08* 2.91* 4.08*  CALCIUM 9.8 9.8 9.9  PHOS 2.9 3.1 3.9  GLUCOSE 139* 151* 126*    CBG (last 3)   Recent Labs  02/13/15 0420 02/13/15 0730 02/13/15 1344  GLUCAP 102* 120* 98    Scheduled Meds: . antiseptic oral rinse  7 mL Mouth Rinse q12n4p  . [START ON 02/14/2015] cefUROXime (ZINACEF)  IV  1.5 g Intravenous On Call to OR  . chlorhexidine  15 mL Mouth Rinse BID  . darbepoetin (ARANESP) injection - DIALYSIS  150 mcg Intravenous Q Tue-HD  . folic acid  1 mg Oral Daily  . heparin subcutaneous  5,000 Units Subcutaneous 3 times per day  . hydroxychloroquine  200 mg Oral BID  .  labetalol  250 mg Per Tube 3 times per day  . multivitamin  1 tablet Oral QHS  . mycophenolate  500 mg Oral BID  . neomycin-bacitracin-polymyxin   Topical TID  . pantoprazole sodium  40 mg Per Tube QHS  . thiamine  100 mg Oral Daily    Continuous Infusions: . feeding supplement (VITAL AF 1.2 CAL)      Kallie Locks, MS, RD, LDN Pager # [phone] After hours/ weekend pager # [phone]

## 2015-02-13 NOTE — Progress Notes (Signed)
Progress Note  [name] [name] CZY:[id number] DOB: [birth year] DOA: 01/18/2015 PCP: Lamar Blinks, MD  Admit HPI / Brief Narrative: 54 y/o female with lupus (on cellcept, plaquenil) and associated CKD, DM, and HTN who presented 3/17 with vague c/o SOB, diarrhea, malaise, weakness, and cough. She was admitted by [facility] and r/o for flu, and was undergoing further w/u for diarrhea, mildly elevated troponin and anemia. Overnight 3/19 she developed intermittent SVT, then persistent tachycardia with HR 150's and worsening SOB and hypoxia requiring NRB. PCCM consulted 3/20. She required ETT/MV 3/20. CXR with B infiltrates. Initiated CVVHD on 3/21.  Awaiting Clipping so patient can possible go to CIR?  HPI/Subjective: In dialysis C/o sore in roof of mouth  Assessment/Plan: Acute hypoxic respiratory failure - acute pulmonary edema in setting HTN and renal failure infiltrates have completely cleared -sats 96% on RA - resolved   SVT/sinus tachycardia HR 150 at time of her acute decompensation, Adenosine given, appeared to be sinus with some non-conducted p-waves - presumed reactive to critical illness - PE was questioned butBLE venous dopplers negative; doubt PE (heparin gtt stopped 3/23) - still having intermittent tachy, but not as severe - TSH normal - AM cortisol ok  Hypotension BP has now normalized   Newly diagnosed Diastolic CHF -EF 09-32% with MOD aortic, tricuspid and mitral regurg - volume management per HD  -Inc Labetalol to 250 mg TID  Acute on chronic renal failure, presume lupus nephritis -As per Nephrology - ongoing HD - plan for AVF as improves further   SLE -continue Cellcept and Plaq  Anemia -s/p 1 unit PRBC 3/22 - Hgb presently stable   Hyponatremia  -Corrected w/ HD   Metabolic acidosis -Corrected w/ HD   Diarrhea / Abdom pain  flexiseal out  Acute encephalopathy - fluctuating -Head Ct neg 3/27 - EEG 3/55 metabolic enceph - MRI 7/32 > Remote RIGHT  inferior brainstem hemorrhage, stable RIGHT basal ganglia micro hemorrhage, Remote small RIGHT inferior cerebellar infarct - CSF 3/31 neg for lupus cerebri - Neuro signed off on 4/3  Protein Calorie Malnutrition  Tube feeds presently - NPO since procedure on 4/6 -SLP to re-eval    Code Status: FULL Family Communication: no family at bedside Disposition Plan: CIR? Once clipped    Consultants: Dr Fleet Contras. (Nephrology) Barry Dienes (Neurology) Dr.David B Simonds [facility] Jerilynn Mages)   Dr.Brian Starlyn Skeans (vascular surgery)   Procedure/Significant Events: 3/19 TTE KG25-42%, grade 1 diastolic dysfunction, mod AR, mod MR, mod TR. No pericardial effusion 3/20 venous duplex B LE - negative 3/20 intubated  3/21 Adenosine challenge > sinus rhythm with some non-conducted p-waves 3/23 CVVHD, I/O even last 24 h, tachycardia improved, fent + propofol 3/24 high residuals, on max dose fentanyl. Rx'd with Reglan  3/26 continued agitation with WUA / SBT, changed back to propofol/fentanyl  3/28 seroquel trance, neuro consult 3/29 more awake, follows commands - extubated  3/30 reintubated, bronchoscopy - for LLL atx 4/03 fentanyl drip stopped 4/04 tunneled cath placed 4/04 extubated    Culture   Antibiotics:   DVT prophylaxis: Heparin SQ   Devices    LINES / TUBES:      Continuous Infusions: . feeding supplement (VITAL AF 1.2 CAL) 1,000 mL (02/12/15 0800)    Objective: VITAL SIGNS: Temp: 98.2 F (36.8 C) (04/12 0754) Temp Source: Oral (04/12 0754) BP: 135/66 mmHg (04/12 0754) Pulse Rate: 95 (04/12 0754)    Intake/Output Summary (Last 24 hours) at 02/13/15 0849 Last data filed at 02/13/15 0756  Gross  per 24 hour  Intake    420 ml  Output      0 ml  Net    420 ml     Exam: General: NG tube in place- ulcer under on left side- in dialysis Lungs: Clear, no wheezing Cardiovascular: rrr Abdomen: +BS, soft Extremities: no edema  Data  Reviewed: Basic Metabolic Panel:  Recent Labs Lab 02/08/15 1020 02/09/15 0441 02/10/15 0738 02/11/15 0912 02/12/15 0430  NA 138 136 135 134* 134*  K 3.9 3.6 4.0 3.7 4.0  CL 101 96 95* 98 94*  CO2 24 29 31 23 29   GLUCOSE 152* 135* 139* 151* 126*  BUN 30* 12 29* 18 33*  CREATININE 4.52* 2.44* 4.08* 2.91* 4.08*  CALCIUM 9.8 9.9 9.8 9.8 9.9  PHOS 2.3 2.3 2.9 3.1 3.9   Liver Function Tests:  Recent Labs Lab 02/08/15 1020 02/09/15 0441 02/10/15 0738 02/11/15 0912 02/12/15 0430  ALBUMIN 2.3* 2.4* 2.5* 2.5* 2.5*   No results for input(s): LIPASE, AMYLASE in the last 168 hours. No results for input(s): AMMONIA in the last 168 hours. CBC:  Recent Labs Lab 02/08/15 1020  WBC 6.9  HGB 7.9*  HCT 25.9*  MCV 91.2  PLT 326   Cardiac Enzymes: No results for input(s): CKTOTAL, CKMB, CKMBINDEX, TROPONINI in the last 168 hours. BNP (last 3 results) No results for input(s): BNP in the last 8760 hours.  ProBNP (last 3 results)  Recent Labs  10/05/14 0521  PROBNP 20673.0*    CBG:  Recent Labs Lab 02/12/15 1619 02/12/15 2014 02/12/15 2352 02/13/15 0420 02/13/15 0730  GLUCAP 127* 101* 97 102* 120*    No results found for this or any previous visit (from the past 240 hour(s)).     Scheduled Meds:  Scheduled Meds: . antiseptic oral rinse  7 mL Mouth Rinse q12n4p  . chlorhexidine  15 mL Mouth Rinse BID  . darbepoetin (ARANESP) injection - DIALYSIS  150 mcg Intravenous Q Tue-HD  . folic acid  1 mg Oral Daily  . heparin subcutaneous  5,000 Units Subcutaneous 3 times per day  . hydroxychloroquine  200 mg Oral BID  . labetalol  250 mg Per Tube 3 times per day  . multivitamin  1 tablet Oral QHS  . mycophenolate  500 mg Oral BID  . neomycin-bacitracin-polymyxin   Topical TID  . pantoprazole sodium  40 mg Per Tube QHS  . thiamine  100 mg Oral Daily    Time spent on care of this patient: 25 mins   Eulogio Bear , DO  [facility] Office   [phone] Pager - [phone]  On-Call/Text Page:      [email or web address]      password [email or web address]  If 7PM-7AM, please contact night-coverage [email or web address] Password [facility] 02/13/2015, 8:49 AM   LOS: 25 days

## 2015-02-14 ENCOUNTER — Other Ambulatory Visit: Payer: Self-pay | Admitting: *Deleted

## 2015-02-14 ENCOUNTER — Encounter (HOSPITAL_COMMUNITY)
Admission: EM | Disposition: A | Discharge status: Rehabilitation Facility | Payer: Self-pay | Source: Home / Self Care | Attending: Internal Medicine

## 2015-02-14 ENCOUNTER — Inpatient Hospital Stay (HOSPITAL_COMMUNITY): Payer: Managed Care, Other (non HMO) | Admitting: Anesthesiology

## 2015-02-14 DIAGNOSIS — N186 End stage renal disease: Secondary | ICD-10-CM

## 2015-02-14 DIAGNOSIS — Z4931 Encounter for adequacy testing for hemodialysis: Secondary | ICD-10-CM

## 2015-02-14 DIAGNOSIS — N185 Chronic kidney disease, stage 5: Secondary | ICD-10-CM

## 2015-02-14 HISTORY — PX: BASCILIC VEIN TRANSPOSITION: SHX5742

## 2015-02-14 LAB — BASIC METABOLIC PANEL
Anion gap: 8 (ref 5–15)
BUN: 24 mg/dL — AB (ref 6–23)
CALCIUM: 9.6 mg/dL (ref 8.4–10.5)
CO2: 30 mmol/L (ref 19–32)
CREATININE: 3.07 mg/dL — AB (ref 0.50–1.10)
Chloride: 99 mmol/L (ref 96–112)
GFR, EST AFRICAN AMERICAN: 19 mL/min — AB (ref >90)
GFR, EST NON AFRICAN AMERICAN: 16 mL/min — AB (ref >90)
Glucose, Bld: 89 mg/dL (ref 70–99)
Potassium: 4 mmol/L (ref 3.5–5.1)
Sodium: 137 mmol/L (ref 135–145)

## 2015-02-14 LAB — RENAL FUNCTION PANEL
ANION GAP: 9 (ref 5–15)
Albumin: 2.6 g/dL — ABNORMAL LOW (ref 3.5–5.2)
BUN: 24 mg/dL — ABNORMAL HIGH (ref 6–23)
CO2: 30 mmol/L (ref 19–32)
Calcium: 9.6 mg/dL (ref 8.4–10.5)
Chloride: 99 mmol/L (ref 96–112)
Creatinine, Ser: 3.08 mg/dL — ABNORMAL HIGH (ref 0.50–1.10)
GFR calc non Af Amer: 16 mL/min — ABNORMAL LOW (ref >90)
GFR, EST AFRICAN AMERICAN: 19 mL/min — AB (ref >90)
GLUCOSE: 89 mg/dL (ref 70–99)
Phosphorus: 4.2 mg/dL (ref 2.3–4.6)
Potassium: 3.9 mmol/L (ref 3.5–5.1)
SODIUM: 138 mmol/L (ref 135–145)

## 2015-02-14 LAB — CBC
HCT: 26.2 % — ABNORMAL LOW (ref 36.0–46.0)
Hemoglobin: 7.8 g/dL — ABNORMAL LOW (ref 12.0–15.0)
MCH: 27.7 pg (ref 26.0–34.0)
MCHC: 29.8 g/dL — ABNORMAL LOW (ref 30.0–36.0)
MCV: 92.9 fL (ref 78.0–100.0)
Platelets: 266 10*3/uL (ref 150–400)
RBC: 2.82 MIL/uL — AB (ref 3.87–5.11)
RDW: 18.8 % — AB (ref 11.5–15.5)
WBC: 5.5 10*3/uL (ref 4.0–10.5)

## 2015-02-14 LAB — GLUCOSE, CAPILLARY
GLUCOSE-CAPILLARY: 100 mg/dL — AB (ref 70–99)
GLUCOSE-CAPILLARY: 87 mg/dL (ref 70–99)
Glucose-Capillary: 76 mg/dL (ref 70–99)
Glucose-Capillary: 83 mg/dL (ref 70–99)
Glucose-Capillary: 104 mg/dL — ABNORMAL HIGH (ref 70–99)
Glucose-Capillary: 104 mg/dL — ABNORMAL HIGH (ref 70–99)

## 2015-02-14 LAB — SURGICAL PCR SCREEN
MRSA, PCR: NEGATIVE
Staphylococcus aureus: NEGATIVE

## 2015-02-14 SURGERY — TRANSPOSITION, VEIN, BASILIC
Anesthesia: Monitor Anesthesia Care | Site: Arm Upper | Laterality: Left

## 2015-02-14 MED ORDER — SODIUM CHLORIDE 0.9 % IR SOLN
Status: DC | PRN
  Administered 2015-02-14: 12:00:00

## 2015-02-14 MED ORDER — SODIUM CHLORIDE 0.9 % IV SOLN
INTRAVENOUS | Status: DC | PRN
  Administered 2015-02-14: 10:00:00 via INTRAVENOUS

## 2015-02-14 MED ORDER — LIDOCAINE-EPINEPHRINE 0.5 %-1:200000 IJ SOLN
INTRAMUSCULAR | Status: DC | PRN
  Administered 2015-02-14: 11 mL

## 2015-02-14 MED ORDER — ROCURONIUM BROMIDE 50 MG/5ML IV SOLN
INTRAVENOUS | Status: AC
  Filled 2015-02-14: qty 1

## 2015-02-14 MED ORDER — DEXMEDETOMIDINE HCL 200 MCG/2ML IV SOLN
INTRAVENOUS | Status: DC | PRN
  Administered 2015-02-14: 8 ug via INTRAVENOUS
  Administered 2015-02-14: 12 ug via INTRAVENOUS

## 2015-02-14 MED ORDER — DEXMEDETOMIDINE HCL IN NACL 200 MCG/50ML IV SOLN
INTRAVENOUS | Status: DC | PRN
  Administered 2015-02-14: 0.7 ug/kg/h via INTRAVENOUS

## 2015-02-14 MED ORDER — OXYCODONE-ACETAMINOPHEN 5-325 MG PO TABS: 1.0000 | ORAL_TABLET | ORAL | Status: DC | PRN

## 2015-02-14 MED ORDER — LIDOCAINE-EPINEPHRINE 0.5 %-1:200000 IJ SOLN
INTRAMUSCULAR | Status: AC
  Filled 2015-02-14: qty 1

## 2015-02-14 MED ORDER — 0.9 % SODIUM CHLORIDE (POUR BTL) OPTIME
TOPICAL | Status: DC | PRN
  Administered 2015-02-14: 1000 mL

## 2015-02-14 MED ORDER — FENTANYL CITRATE 0.05 MG/ML IJ SOLN
INTRAMUSCULAR | Status: DC | PRN
  Administered 2015-02-14 (×3): 50 ug via INTRAVENOUS

## 2015-02-14 MED ORDER — DEXAMETHASONE SODIUM PHOSPHATE 4 MG/ML IJ SOLN
INTRAMUSCULAR | Status: AC
  Filled 2015-02-14: qty 2

## 2015-02-14 MED ORDER — ONDANSETRON HCL 4 MG/2ML IJ SOLN
INTRAMUSCULAR | Status: AC
  Filled 2015-02-14: qty 2

## 2015-02-14 MED ORDER — ONDANSETRON HCL 4 MG/2ML IJ SOLN
INTRAMUSCULAR | Status: DC | PRN
  Administered 2015-02-14: 4 mg via INTRAVENOUS

## 2015-02-14 MED ORDER — MIDAZOLAM HCL 5 MG/5ML IJ SOLN
INTRAMUSCULAR | Status: DC | PRN
  Administered 2015-02-14 (×2): 1 mg via INTRAVENOUS

## 2015-02-14 MED ORDER — LIDOCAINE HCL (CARDIAC) 20 MG/ML IV SOLN
INTRAVENOUS | Status: AC
  Filled 2015-02-14: qty 5

## 2015-02-14 MED ORDER — MIDAZOLAM HCL 2 MG/2ML IJ SOLN
INTRAMUSCULAR | Status: AC
  Filled 2015-02-14: qty 2

## 2015-02-14 MED ORDER — FERROUS SULFATE 325 (65 FE) MG PO TABS
325.0000 mg | ORAL_TABLET | Freq: Two times a day (BID) | ORAL | Status: DC
  Administered 2015-02-14 – 2015-02-15 (×2): 325 mg via ORAL
  Filled 2015-02-14 (×4): qty 1

## 2015-02-14 MED ORDER — PROPOFOL 10 MG/ML IV BOLUS
INTRAVENOUS | Status: AC
  Filled 2015-02-14: qty 20

## 2015-02-14 MED ORDER — FENTANYL CITRATE 0.05 MG/ML IJ SOLN
INTRAMUSCULAR | Status: AC
  Filled 2015-02-14: qty 5

## 2015-02-14 SURGICAL SUPPLY — 32 items
APL SKNCLS STERI-STRIP NONHPOA (GAUZE/BANDAGES/DRESSINGS) ×1
BENZOIN TINCTURE PRP APPL 2/3 (GAUZE/BANDAGES/DRESSINGS) ×3 IMPLANT
CANISTER SUCTION 2500CC (MISCELLANEOUS) ×3 IMPLANT
CANNULA VESSEL 3MM 2 BLNT TIP (CANNULA) IMPLANT
CLIP LIGATING EXTRA MED SLVR (CLIP) ×3 IMPLANT
CLIP LIGATING EXTRA SM BLUE (MISCELLANEOUS) ×3 IMPLANT
CLOSURE STERI-STRIP 1/2X4 (GAUZE/BANDAGES/DRESSINGS) ×1
CLOSURE WOUND 1/2 X4 (GAUZE/BANDAGES/DRESSINGS) ×1
CLSR STERI-STRIP ANTIMIC 1/2X4 (GAUZE/BANDAGES/DRESSINGS) ×1 IMPLANT
COVER PROBE W GEL 5X96 (DRAPES) ×3 IMPLANT
DECANTER SPIKE VIAL GLASS SM (MISCELLANEOUS) ×1 IMPLANT
ELECT REM PT RETURN 9FT ADLT (ELECTROSURGICAL) ×3
ELECTRODE REM PT RTRN 9FT ADLT (ELECTROSURGICAL) ×1 IMPLANT
GAUZE SPONGE 4X4 12PLY STRL (GAUZE/BANDAGES/DRESSINGS) ×3 IMPLANT
GEL ULTRASOUND 20GR AQUASONIC (MISCELLANEOUS) IMPLANT
GLOVE SS BIOGEL STRL SZ 7.5 (GLOVE) ×1 IMPLANT
GLOVE SUPERSENSE BIOGEL SZ 7.5 (GLOVE) ×2
GOWN STRL REUS W/ TWL LRG LVL3 (GOWN DISPOSABLE) ×3 IMPLANT
GOWN STRL REUS W/TWL LRG LVL3 (GOWN DISPOSABLE) ×9
KIT BASIN OR (CUSTOM PROCEDURE TRAY) ×3 IMPLANT
KIT ROOM TURNOVER OR (KITS) ×3 IMPLANT
NS IRRIG 1000ML POUR BTL (IV SOLUTION) ×3 IMPLANT
PACK CV ACCESS (CUSTOM PROCEDURE TRAY) ×3 IMPLANT
PAD ARMBOARD 7.5X6 YLW CONV (MISCELLANEOUS) ×6 IMPLANT
STRIP CLOSURE SKIN 1/2X4 (GAUZE/BANDAGES/DRESSINGS) ×2 IMPLANT
SUT PROLENE 6 0 CC (SUTURE) ×3 IMPLANT
SUT SILK 2 0 SH (SUTURE) ×3 IMPLANT
SUT VIC AB 3-0 SH 27 (SUTURE) ×3
SUT VIC AB 3-0 SH 27X BRD (SUTURE) ×1 IMPLANT
TAPE CLOTH SURG 4X10 WHT LF (GAUZE/BANDAGES/DRESSINGS) ×2 IMPLANT
UNDERPAD 30X30 INCONTINENT (UNDERPADS AND DIAPERS) ×3 IMPLANT
WATER STERILE IRR 1000ML POUR (IV SOLUTION) ×1 IMPLANT

## 2015-02-14 NOTE — Interval H&P Note (Signed)
History and Physical Interval Note:  02/14/2015 10:55 AM  [name] [name]  has presented today for surgery, with the diagnosis of End Stage Renal Disease N18.6  The various methods of treatment have been discussed with the patient and family. After consideration of risks, benefits and other options for treatment, the patient has consented to  Procedure(s): BASILIC VEIN TRANSPOSITION (Left) as a surgical intervention .  The patient's history has been reviewed, patient examined, no change in status, stable for surgery.  I have reviewed the patient's chart and labs.  Questions were answered to the patient's satisfaction.     Buford Gayler

## 2015-02-14 NOTE — Progress Notes (Signed)
I discussed with [name] [name] in dialysis and pt is in Clipping process. I will update [employer] insurance pt's current functional levels to assist with planning dispo pending bed availlability over the next few days. Last MBS 4/9. [phone]

## 2015-02-14 NOTE — Transfer of Care (Signed)
Immediate Anesthesia Transfer of Care Note  Patient: [name] [name]  Procedure(s) Performed: Procedure(s): BASILIC VEIN Fistula Creation First Stage (Left)  Patient Location: PACU  Anesthesia Type:MAC  Level of Consciousness: awake, alert , oriented and patient cooperative  Airway & Oxygen Therapy: Patient Spontanous Breathing  Post-op Assessment: Report given to RN and Post -op Vital signs reviewed and stable  Post vital signs: Reviewed and stable  Last Vitals:  Filed Vitals:   02/14/15 0806  BP: 133/71  Pulse: 85  Temp: 37 C  Resp:     Complications: No apparent anesthesia complications

## 2015-02-14 NOTE — Progress Notes (Signed)
Progress Note  [name] [name] UUV:[id number] DOB: 1961-01-05 DOA: 01/18/2015 PCP: Abbe Amsterdam, MD  Admit HPI / Brief Narrative: 54 y/o female with lupus (on cellcept, plaquenil) and associated CKD, DM, and HTN who presented 3/17 with vague c/o SOB, diarrhea, malaise, weakness, and cough. She was admitted by [facility] and r/o for flu, and was undergoing further w/u for diarrhea, mildly elevated troponin and anemia. Overnight 3/19 she developed intermittent SVT, then persistent tachycardia with HR 150's and worsening SOB and hypoxia requiring NRB. PCCM consulted 3/20. She required ETT/MV 3/20. CXR with B infiltrates. Initiated CVVHD on 3/21.  Awaiting Clipping so patient can possible go to CIR?  HPI/Subjective: Alert, denies abdominal pain. Soreness in her mouth has improved.   Assessment/Plan: Acute hypoxic respiratory failure - acute pulmonary edema in setting HTN and renal failure infiltrates have completely cleared -sats 96% on RA - resolved   SVT/sinus tachycardia HR 150 at time of her acute decompensation, Adenosine given, appeared to be sinus with some non-conducted p-waves - presumed reactive to critical illness - PE was questioned butBLE venous dopplers negative; doubt PE (heparin gtt stopped 3/23) - still having intermittent tachy, but not as severe - TSH normal - AM cortisol ok Continue with labetalol.   Protein Calorie Malnutrition  Tube feeds presently - NPO since procedure on 4/6 -SLP to re-evaluate.   Hypotension BP has now normalized   Newly diagnosed Diastolic CHF -EF 47-42% with MOD aortic, tricuspid and mitral regurg - volume management per HD  -Inc Labetalol to 250 mg TID  Acute on chronic renal failure, presume lupus nephritis -As per Nephrology - ongoing HD  - S/P  AVF   SLE -continue Cellcept and Plaq  Anemia -s/p 1 unit PRBC 3/22 - Hgb presently stable  -start ferrous sulfate.   Hyponatremia  -Corrected w/ HD   Metabolic acidosis -Corrected  w/ HD   Diarrhea / Abdom pain  Improved.  flexiseal out.   Acute encephalopathy - fluctuating -Head Ct neg 3/27 - EEG 3/30 metabolic enceph - MRI 3/30 > Remote RIGHT inferior brainstem hemorrhage, stable RIGHT basal ganglia micro hemorrhage, Remote small RIGHT inferior cerebellar infarct - CSF 3/31 neg for lupus cerebri - Neuro signed off on 4/3 Alert, following command.   Code Status: FULL Family Communication: care discussed with patient.  Disposition Plan: CIR? Once clipped  Consultants: Dr Primitivo Gauze. (Nephrology) Lester Kinsman (Neurology) Dr.David B Simonds [facility] Judie Petit)   Dr.Brian Rolena Infante (vascular surgery)   Procedure/Significant Events: 3/19 TTE EF40-45%, grade 1 diastolic dysfunction, mod AR, mod MR, mod TR. No pericardial effusion 3/20 venous duplex B LE - negative 3/20 intubated  3/21 Adenosine challenge > sinus rhythm with some non-conducted p-waves 3/23 CVVHD, I/O even last 24 h, tachycardia improved, fent + propofol 3/24 high residuals, on max dose fentanyl. Rx'd with Reglan  3/26 continued agitation with WUA / SBT, changed back to propofol/fentanyl  3/28 seroquel trance, neuro consult 3/29 more awake, follows commands - extubated  3/30 reintubated, bronchoscopy - for LLL atx 4/03 fentanyl drip stopped 4/04 tunneled cath placed 4/04 extubated    Culture   Antibiotics:   DVT prophylaxis: Heparin SQ   Devices    LINES / TUBES:      Continuous Infusions: . feeding supplement (NEPRO CARB STEADY) 1,000 mL (02/14/15 1354)    Objective: VITAL SIGNS: Temp: 98.1 F (36.7 C) (04/13 1320) Temp Source: Oral (04/13 0806) BP: 111/55 mmHg (04/13 1300) Pulse Rate: 84 (04/13 1330)    Intake/Output Summary (Last  24 hours) at 02/14/15 1414 Last data filed at 02/14/15 1235  Gross per 24 hour  Intake    560 ml  Output    305 ml  Net    255 ml     Exam: General: NG tube in place- ulcer under on left side.  Lungs:  CTA Cardiovascular: rrr Abdomen: +BS, soft Extremities: no edema  Data Reviewed: Basic Metabolic Panel:  Recent Labs Lab 02/10/15 0738 02/11/15 0912 02/12/15 0430 02/13/15 1600 02/14/15 0525  NA 135 134* 134* 137 137  138  K 4.0 3.7 4.0 4.2 4.0  3.9  CL 95* 98 94* 100 99  99  CO2 31 23 29 29 30  30   GLUCOSE 139* 151* 126* 126* 89  89  BUN 29* 18 33* 12 24*  24*  CREATININE 4.08* 2.91* 4.08* 1.90* 3.07*  3.08*  CALCIUM 9.8 9.8 9.9 9.1 9.6  9.6  PHOS 2.9 3.1 3.9 2.2* 4.2   Liver Function Tests:  Recent Labs Lab 02/10/15 0738 02/11/15 0912 02/12/15 0430 02/13/15 1600 02/14/15 0525  ALBUMIN 2.5* 2.5* 2.5* 2.5* 2.6*   No results for input(s): LIPASE, AMYLASE in the last 168 hours. No results for input(s): AMMONIA in the last 168 hours. CBC:  Recent Labs Lab 02/08/15 1020 02/13/15 0500 02/14/15 0525  WBC 6.9 6.3 5.5  HGB 7.9* 7.8* 7.8*  HCT 25.9* 24.6* 26.2*  MCV 91.2 88.8 92.9  PLT 326 283 266   Cardiac Enzymes: No results for input(s): CKTOTAL, CKMB, CKMBINDEX, TROPONINI in the last 168 hours. BNP (last 3 results) No results for input(s): BNP in the last 8760 hours.  ProBNP (last 3 results)  Recent Labs  10/05/14 0521  PROBNP 20673.0*    CBG:  Recent Labs Lab 02/13/15 2038 02/14/15 0005 02/14/15 0442 02/14/15 0803 02/14/15 1306  GLUCAP 100* 100* 87 76 83    Recent Results (from the past 240 hour(s))  Surgical pcr screen     Status: None   Collection Time: 02/14/15 12:21 AM  Result Value Ref Range Status   MRSA, PCR NEGATIVE NEGATIVE Final   Staphylococcus aureus NEGATIVE NEGATIVE Final    Comment:        The Xpert SA Assay (FDA approved for NASAL specimens in patients over 47 years of age), is one component of a comprehensive surveillance program.  Test performance has been validated by [facility] for patients greater than or equal to 97 year old. It is not intended to diagnose infection nor to guide or monitor  treatment.        Scheduled Meds:  Scheduled Meds: . antiseptic oral rinse  7 mL Mouth Rinse q12n4p  . chlorhexidine  15 mL Mouth Rinse BID  . darbepoetin (ARANESP) injection - DIALYSIS  150 mcg Intravenous Q Tue-HD  . feeding supplement (PRO-STAT SUGAR FREE 64)  30 mL Per Tube Daily  . folic acid  1 mg Oral Daily  . free water  100 mL Per Tube TID  . heparin subcutaneous  5,000 Units Subcutaneous 3 times per day  . hydroxychloroquine  200 mg Oral BID  . labetalol  250 mg Per Tube 3 times per day  . multivitamin  1 tablet Oral QHS  . mycophenolate  500 mg Oral BID  . neomycin-bacitracin-polymyxin   Topical TID  . pantoprazole sodium  40 mg Per Tube QHS  . thiamine  100 mg Oral Daily    Time spent on care of this patient: 25 mins   Shyloh Derosa A ,  MD.   [facility] Office  [phone] Pager - [phone]  On-Call/Text Page:      [email or web address]      password [email or web address]  If 7PM-7AM, please contact night-coverage [email or web address] Password [email or web address] 02/14/2015, 2:14 PM   LOS: 26 days

## 2015-02-14 NOTE — Anesthesia Postprocedure Evaluation (Signed)
  Anesthesia Post-op Note  Patient: [name] [name]  Procedure(s) Performed: Procedure(s): BASILIC VEIN Fistula Creation First Stage (Left)  Patient Location: PACU  Anesthesia Type:MAC  Level of Consciousness: awake, alert  and oriented  Airway and Oxygen Therapy: Patient Spontanous Breathing and Patient connected to nasal cannula oxygen  Post-op Pain: none  Post-op Assessment: Post-op Vital signs reviewed, Patient's Cardiovascular Status Stable, Respiratory Function Stable, Patent Airway and Pain level controlled  Post-op Vital Signs: stable  Last Vitals:  Filed Vitals:   02/14/15 1755  BP: 114/57  Pulse: 92  Temp: 36.6 C  Resp:     Complications: No apparent anesthesia complications

## 2015-02-14 NOTE — Clinical Social Work Note (Signed)
CSW received consult for SNF placement. [name] [name] is being evaluated for Inpatient rehab. CSW is being updated by [facility] Inpatient coordinators and will continue to follow.  Kayvon Mo Givens, MSW, LCSW Licensed Clinical Social Worker [place] [phone]

## 2015-02-14 NOTE — Progress Notes (Signed)
PT Cancellation Note  Patient Details Name: VENNELA JUTTE MRN: [id number] DOB: [birth year]   Cancelled Treatment:    Reason Eval/Treat Not Completed: Patient at procedure or test/unavailable; Patient receiving L basilic vein transposition. Will plan to see patient for PT treatment tomorrow (4/14).        Shawna Orleans 02/14/2015, 1:18 PM  Shawna Orleans, SPT Acute Rehab Services Office: [phone]

## 2015-02-14 NOTE — Progress Notes (Signed)
Report given to celine rn as caregiver

## 2015-02-14 NOTE — Progress Notes (Signed)
Occupational Therapy Treatment Patient Details Name: [name] [name] MRN: [id number] DOB: [birth year] Today's Date: 02/14/2015    History of present illness Pt is a 54 y.o. female presents with SOB on 01/18/15. In addition she had labwork in the ED and this shows significant anemia. Patient has been having anemia for some time. She was recently in the hospital for the anemia but a workup was not conclusive. Also she had a minimally elevated troponin level. Pt required NRB on 3/19 and CVVHD was initiated on 3/21.    OT comments  Pt for surgery today to place AVG. Pt continues to demonstrate generalized weakness and impaired balance interfering with independence and safety with standing ADL and ADL transfers.  Pt has good potential for a home discharge at a supervision level with post acute rehab.  Follow Up Recommendations  CIR;Supervision/Assistance - 24 hour    Equipment Recommendations       Recommendations for Other Services      Precautions / Restrictions Precautions Precautions: Fall       Mobility Bed Mobility Overal bed mobility: Needs Assistance Bed Mobility: Supine to Sit;Sit to Supine     Supine to sit: Supervision Sit to supine: Supervision   General bed mobility comments: no physical assist, use of rail  Transfers Overall transfer level: Needs assistance Equipment used: Rolling walker (2 wheeled) Transfers: Sit to/from Stand Sit to Stand: Min guard         General transfer comment: min guard for safety, good technique    Balance     Sitting balance-Leahy Scale: Good       Standing balance-Leahy Scale: Fair Standing balance comment: able to release walker to perform standing grooming                   ADL Overall ADL's : Needs assistance/impaired     Grooming: Wash/dry hands;Standing;Supervision/safety           Upper Body Dressing : Set up;Standing       Toilet Transfer: Min guard;RW;Ambulation;BSC (over toilet)   Toileting-  Clothing Manipulation and Hygiene: Moderate assistance;Sit to/from stand Toileting - Clothing Manipulation Details (indicate cue type and reason): assist to clean up after BM     Functional mobility during ADLs: Min guard;Rolling walker        Vision                     Perception     Praxis      Cognition   Behavior During Therapy: [facility] for tasks assessed/performed Overall Cognitive Status: Within Functional Limits for tasks assessed                       Extremity/Trunk Assessment               Exercises     Shoulder Instructions       General Comments      Pertinent Vitals/ Pain       Pain Assessment: No/denies pain  Home Living                                          Prior Functioning/Environment              Frequency Min 2X/week     Progress Toward Goals  OT Goals(current goals can now be found in the care plan section)  Progress towards OT goals: Progressing toward goals  Acute Rehab OT Goals Patient Stated Goal: to go to rehab then home Time For Goal Achievement: 02/23/15 Potential to Achieve Goals: Good  Plan Discharge plan remains appropriate    Co-evaluation                 End of Session Equipment Utilized During Treatment: Rolling walker   Activity Tolerance Patient tolerated treatment well   Patient Left in bed;with call bell/phone within reach;with family/visitor present   Nurse Communication          Time: 5183-3582 OT Time Calculation (min): 24 min  Charges: OT General Charges $OT Visit: 1 Procedure OT Treatments $Self Care/Home Management : 23-37 mins  [name] 02/14/2015, 10:16 AM  [phone]

## 2015-02-14 NOTE — PMR Pre-admission (Signed)
PMR Admission Coordinator Pre-Admission Assessment  Patient: [name] [name] is an 54 y.o., female MRN: [id number] DOB: 09-May-1961 Height: 5\' 5"  (165.1 cm) Weight: 54.5 kg (120 lb 2.4 oz)              Insurance Information HMO:     PPO:      PCP:      IPA:      80/20:      OTHER: open access plus plan PRIMARY: [employer]      Policy#: [id number]      Subscriber: pt CM Name: [name] [name]      Phone#: [phone] ext [place]     Fax#: [phone] Pre-Cert#: [id number]      Employer: approved for 7 days with updates needed 4/21 Benefits:  Phone #: [phone]     Name: 4/13 Eff. Date: 12/04/12     Deduct: $1500 met      Out of Pocket Max: $3500 met      Life Max: unlimited CIR: 80%      SNF: 80% 100 days Outpatient: $50 copay per visit     Co-Pay: unlimited visits Home Health: 80%      Co-Pay: 100 days per year DME: 80%     Co-Pay: 20% Providers: in network  SECONDARY: none        Medicaid Application Date:       Case Manager:  Disability Application Date:       Case Worker:   Emergency Contact Information Contact Information    Name Relation Home Work Mobile   [place] [phone]   [place],Brittany Daughter   [phone]   Jenniferlynn, Saad Sister   [phone]   Yasheka, [place]os[place] Mother   [phone]     Current Medical History  Patient Admitting Diagnosis: deconditioning related to respiratory failure, acute on chronic renal failure in a pt with hx of brainstem infarct right paramedian pontine  History of Present Illness: [name] [name] is a 54 y.o. right handed female with history of remote right inferior brainstem hemorrhage, hypertension, diabetes mellitus or peripheral neuropathy, chronic renal insufficiency with baseline creatinine 2.97, lupus. Independent with a cane prior to admission living with her-2 adult sons.   Presented 01/18/2015 with vague complaints of shortness of breath, diarrhea, [place] and cough. Troponin mildly elevated 0.57. Chest x-ray  showed no acute pulmonary process. Patient developed intermittent SVT with persistent tachycardia heart rate 150s became hypoxic requiring NRB and vent support. Cranial CT scan negative. MRI of the brain again showed remote right inferior brainstem hemorrhage without acute findings.. Venous Doppler study negative. Echocardiogram with ejection fraction of 50% severe hypokinesis. EEG with no seizure activity but did show diffuse disturbance etiologically nonspecific suspect metabolic encephalopathy. Acute on chronic renal failure follow-up renal services hemodialysis initiated and pt in [place] process for outpatient center set up. Left  AVF placed 4/13. Patient was extubated 01/05/2015. Currently nothing by mouth with nasogastric tube feeds. SLP recommended 4/15 MBS follow up.Subcutaneous heparin for DVT prophylaxis.    Past Medical History  Past Medical History  Diagnosis Date  . Hypertension   . Lupus   . CKD (chronic kidney disease)     due to lupus/Dr. [name]  . Diabetes mellitus   . Stenosis of cervical spine region     with HNP at C5/6, C6/7  . Metatarsal bone fracture right    4th  . 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  Arthritis     RA  . Anemia   . Stroke 11/2014    left sided weakness, dysphagia  . Pain in joints   . Paresthesias   . Hyperlipidemia   . Colitis     ? per hospital notes 11/2014  . Lower GI bleed   . Hyponatremia   . Metabolic acidosis   . Hyperplastic colon polyp   . Hemorrhoids   . Blood transfusion without reported diagnosis     Family History  family history includes Diabetes in her father and mother; Hypertension in her maternal grandmother, mother, sister, and another family member; Lupus in an other family member; Rheum arthritis in an other family member.  Prior Rehab/Hospitalizations: CIR 12/15 and d/c home at Mod I level   Current Medications   Current facility-administered medications:  .   acetaminophen (TYLENOL) tablet 650 mg, 650 mg, Oral, Q6H PRN, Geradine Girt, DO, 650 mg at 02/13/15 2043 .  antiseptic oral rinse (CPC / CETYLPYRIDINIUM CHLORIDE 0.05%) solution 7 mL, 7 mL, Mouth Rinse, q12n4p, Wilhelmina Mcardle, MD, 7 mL at 02/14/15 1600 .  chlorhexidine (PERIDEX) 0.12 % solution 15 mL, 15 mL, Mouth Rinse, BID, Wilhelmina Mcardle, MD, 15 mL at 02/14/15 2036 .  Darbepoetin Alfa (ARANESP) injection 150 mcg, 150 mcg, Intravenous, Q Tue-HD, Jamal Maes, MD, 150 mcg at 02/06/15 1554 .  feeding supplement (NEPRO CARB STEADY) liquid 1,000 mL, 1,000 mL, Per Tube, Continuous, Kallie Locks, RD, Last Rate: 40 mL/hr at 02/14/15 1354, 1,000 mL at 02/14/15 1354 .  feeding supplement (PRO-STAT SUGAR FREE 64) liquid 30 mL, 30 mL, Per Tube, Daily, Stephanie La, RD, 30 mL at 02/15/15 1043 .  ferrous sulfate tablet 325 mg, 325 mg, Oral, BID WC, Belkys A Regalado, MD, 325 mg at 02/14/15 1700 .  folic acid (FOLVITE) tablet 1 mg, 1 mg, Oral, Daily, Allyne Gee, MD, 1 mg at 02/15/15 1043 .  free water 100 mL, 100 mL, Per Tube, TID, Kallie Locks, RD, 100 mL at 02/15/15 1043 .  heparin injection 5,000 Units, 5,000 Units, Subcutaneous, 3 times per day, Rigoberto Noel, MD, 5,000 Units at 02/15/15 0539 .  hydrocortisone-pramoxine (PROCTOFOAM-HC) rectal foam 1 applicator, 1 applicator, Rectal, BID PRN, Raylene Miyamoto, MD .  hydroxychloroquine (PLAQUENIL) tablet 200 mg, 200 mg, Oral, BID, Fleet Contras, MD, 200 mg at 02/15/15 1043 .  labetalol (NORMODYNE) tablet 250 mg, 250 mg, Per Tube, 3 times per day, Allie Bossier, MD, 250 mg at 02/15/15 0539 .  magic mouthwash, 5 mL, Oral, TID PRN, Geradine Girt, DO .  metoprolol (LOPRESSOR) injection 2.5-5 mg, 2.5-5 mg, Intravenous, Q3H PRN, Vilinda Boehringer, MD, 5 mg at 02/01/15 1204 .  multivitamin (RENA-VIT) tablet 1 tablet, 1 tablet, Oral, QHS, Allyne Gee, MD, 1 tablet at 02/14/15 2129 .  mycophenolate (CELLCEPT) oral suspension 50 mg/mL, 500 mg, Oral, BID,  Fleet Contras, MD, 500 mg at 02/15/15 1043 .  neomycin-bacitracin-polymyxin (NEOSPORIN) ointment, , Topical, TID, Allie Bossier, MD .  ondansetron [facility]) tablet 4 mg, 4 mg, Oral, Q6H PRN **OR** ondansetron (ZOFRAN) injection 4 mg, 4 mg, Intravenous, Q6H PRN, Allyne Gee, MD, 4 mg at 01/21/15 0226 .  oxyCODONE-acetaminophen (PERCOCET/ROXICET) 5-325 MG per tablet 1 tablet, 1 tablet, Oral, Q4H PRN, Alvia Grove, PA-C .  pantoprazole sodium (PROTONIX) 40 mg/20 mL oral suspension 40 mg, 40 mg, Per Tube, QHS, Priscella Mann, RPH, 40 mg at 02/14/15 2130 .  thiamine (VITAMIN B-1) tablet  100 mg, 100 mg, Oral, Daily, Allyne Gee, MD, 100 mg at 02/15/15 1043  Patients Current Diet: NPO with panda tube feeds Nepro at goal rate of 40 ml/hr with Prostat Dietitian notes pt meets criteria for severe malnutrition with 11% weight loss in two weeks and severe muscle wasting  Precautions / Restrictions Precautions Precautions: Fall Precaution Comments: Droplet and enteric precautions Restrictions Weight Bearing Restrictions: No   Prior Activity Level Pt has not worked since [id number]. She is on FMLA per pt. Mod I with cane or RW. Not yet driving. Sees Dr. Letta Pate as an oupt. LUE with residual weakness from previus CVA.  I have left message 4/14 for pt's sister and dtr to contact me to clarify if pt has applied for disability before. Her Christella Scheuermann is still active, but pt doesn't understand how. Will need likely follow up on disability. Pt's two adult sons live with her. 62, daughter, and sister work, but Remo Lipps (sister) is aware that 24/7 supervision is recommended at d/c this time. Last CIR admit pt reached Mod I level.  Home Assistive Devices / Equipment Home Assistive Devices/Equipment: Environmental consultant (specify type), Cane (specify quad or straight), Grab bars in shower, Grab bars around toilet, Eyeglasses, Shower chair with back Home Equipment: Walker - 2 wheels, Cane - single point, Shower seat, Bedside  commode  Prior Functional Level Prior Function Level of Independence: Independent with assistive device(s) Comments: Pt reports on her bad days she uses the cane or the RW.    Current Functional Level Cognition  Overall Cognitive Status: Within Functional Limits for tasks assessed Orientation Level: Oriented X4    Extremity Assessment (includes Sensation/Coordination)  Upper Extremity Assessment: Overall WFL for tasks assessed  Lower Extremity Assessment: Generalized weakness    ADLs  Overall ADL's : Needs assistance/impaired Eating/Feeding: NPO Grooming: Wash/dry hands, Standing, Supervision/safety Upper Body Bathing: Set up, Supervision/ safety, Sitting Lower Body Bathing: Minimal assistance (with Mod A sit<>stand) Upper Body Dressing : Set up, Standing Lower Body Dressing: Minimal assistance (with Mod A sit<>stand) Toilet Transfer: Min guard, RW, Ambulation, BSC (over toilet) Toileting- Clothing Manipulation and Hygiene: Moderate assistance, Sit to/from stand Toileting - Clothing Manipulation Details (indicate cue type and reason): assist to clean up after BM Functional mobility during ADLs: Min guard, Rolling walker General ADL Comments: Patient completed UB/LB bathing in sit<>stand position from recliner. Patient required min verbal cues for safety with use of RW and therapist reiterated importance of using RW during all transfers for safety at this time. Educated patient and NT on importance of patient ambulating <> BR for toilet transfers and toielting needs to help patient regain her strength, endurance, and independence.     Mobility  Overal bed mobility: Needs Assistance Bed Mobility: Supine to Sit, Sit to Supine Supine to sit: Supervision Sit to supine: Supervision General bed mobility comments: no physical assist, use of rail    Transfers  Overall transfer level: Needs assistance Equipment used: Rolling walker (2 wheeled) Transfers: Sit to/from Stand Sit to  Stand: Min guard General transfer comment: min guard for safety, good technique    Ambulation / Gait / Stairs / Wheelchair Mobility  Ambulation/Gait Ambulation/Gait assistance: Physicist, medical (Feet): 100 Feet Assistive device: Rolling walker (2 wheeled) Gait Pattern/deviations: Step-through pattern, Decreased stance time - left, Decreased step length - right Gait velocity: Decreased Gait velocity interpretation: Below normal speed for age/gender General Gait Details: Pt used 2wRW well with minimal verbal cueing and appeared to bear weight evenly between UE's and  LE's. No LOB during gait, but stayed within center of mass throughout.    Posture / Balance Dynamic Sitting Balance Sitting balance - Comments: Max assist required at times for sitting balance.  Balance Overall balance assessment: Needs assistance Sitting-balance support: No upper extremity supported, Feet supported Sitting balance-Leahy Scale: Good Sitting balance - Comments: Max assist required at times for sitting balance.  Postural control: Posterior lean, Right lateral lean, Left lateral lean Standing balance support: Bilateral upper extremity supported, During functional activity Standing balance-Leahy Scale: Fair Standing balance comment: able to release walker to perform standing grooming    Special needs/care consideration Dialysis new ESRD        Days currently T, TH, Sat Judy at dialysis 2 [phone] working on Clip to [employer] center 4/23 Sacral dressing per 4/6   2.5 x 2 x 0 cm 100 % granulation with foam dressing Skin WOC 4/11    Tenna Child, RN Registered Nurse Signed WOC Consult Note 02/12/2015 2:46 PM    Expand All Collapse All   WOC follow-up: Area below nare/above lip with previous deep tissue injury, has evolved into stage 2 wound; 1X1X.1cm, red and moist, no odor or drainage. Continue antibiotic ointment to promote moist healing. Discussed plan of care with patient and she verbalizes  understanding. Please re-consult if further assistance is needed. Gae Dry MSN, RN, CWOCN, [place], [place] [phone]            Bowel mgmt: 3 loose BMS 4/13 after change of tube feeding. Pt placed on enteric precautions. As of 4/14 at 12 no BM to be sent for cdiff analysis. Bladder mgmt:continent Diabetic mgmt yes   Previous Home Environment Living Arrangements: Children Available Help at Discharge: Available 24 hours/day, Family Type of Home: Apartment Home Layout: Two level, Able to live on main level with bedroom/bathroom Alternate Level Stairs-Number of Steps: flight Home Access: Stairs to enter Entrance Stairs-Rails: Right Entrance Stairs-Number of Steps: 4 Bathroom Shower/Tub: Gaffer, Charity fundraiser: Handicapped height Bathroom Accessibility: Yes Ama: Yes Type of Home Care Services: [place] (if known): advance home care Additional Comments: Pt was previously receiving [place] and PT  Discharge Living Setting Plans for Discharge Living Setting: Apartment Type of Home at Discharge: Apartment Discharge Home Layout: Two level, Able to live on main level with bedroom/bathroom Alternate Level Stairs-Number of Steps: flight Discharge Home Access: Stairs to enter Entrance Stairs-Rails: Right Entrance Stairs-Number of Steps: 4 Discharge Bathroom Shower/Tub: Walk-in shower Discharge Bathroom Toilet: Handicapped height Discharge Bathroom Accessibility: Yes How Accessible: Accessible via walker Does the patient have any problems obtaining your medications?: No  Social/Family/Support Systems Patient Roles: Parent Caregiver Availability: 24/7 Discharge Plan Discussed with Primary Caregiver: Yes Is Caregiver In Agreement with Plan?: Yes Does Caregiver/Family have Issues with Lodging/Transportation while Pt is in Rehab?: No  Goals/Additional Needs Patient/Family Goal for Rehab: supervision PT, OT, and SLP Expected  length of stay: ELOS 10-12 days Equipment Needs: [facility] ERSD this admission Special Service Needs: Out pt dialysis in center new Additional Information: Bethena Roys in dialysis at [phone] is clipping pt Pt/Family Agrees to Admission and willing to participate: Yes Program Orientation Provided & Reviewed with Pt/Caregiver Including Roles  & Responsibilities: Yes  Decrease burden of Care through IP rehab admission: n/a  Possible need for SNF placement upon discharge:not anticipated  Patient Condition: This patient's medical and functional status has changed since the consult dated: 02/07/2015 in which the Rehabilitation Physician determined and documented that the  patient's condition is appropriate for intensive rehabilitative care in an inpatient rehabilitation facility. See "History of Present Illness" (above) for medical update. Functional changes are: min assist with PT and OT, NPO with panda feeds. Patient's medical and functional status update has been discussed with the Rehabilitation physician and patient remains appropriate for inpatient rehabilitation. Will admit to inpatient rehab today.  Preadmission Screen Completed By:  Cleatrice Burke, 02/15/2015 12:37 PM ______________________________________________________________________   Discussed status with Dr. Letta Pate on 02/15/15 at  1228 and received telephone approval for admission today.  Admission Coordinator:  Cleatrice Burke, time 1228 Date 02/15/15.

## 2015-02-14 NOTE — Op Note (Signed)
    OPERATIVE REPORT  DATE OF SURGERY: 02/14/2015  PATIENT: [name] [name], 54 y.o. female MRN: [id number]  DOB: 03-24-1961  PRE-OPERATIVE DIAGNOSIS: end-stage renal disease  POST-OPERATIVE DIAGNOSIS:  Same  PROCEDURE: first stage basilic vein fistula  SURGEON:  Curt Jews, M.D.  PHYSICIAN ASSISTANT: Trinh  ANESTHESIA:  Local with sedation  EBL: minimal ml     BLOOD ADMINISTERED: none  DRAINS: none  SPECIMEN: none  COUNTS CORRECT:  YES  PLAN OF CARE: PACU stable   PATIENT DISPOSITION:  PACU - hemodynamically stable  PROCEDURE DETAILS: The patient was taken to the operating room placed supine position where the area of the left arm prepped draped in sterile fashion. SonoSite ultrasound was used to visualize the veins in the left arm. The patient's cephalic vein at the area above the antecubital space was thrombosed. The basilic vein was patent and was of moderate size. The vein branch just above the level of the antecubital space. The more medial of these branches was chosen for anastomosis to the brachial artery. The more lateral branch was left intact for potential future use for mobilization in the second stage transposition. Using local anesthesia, incision was made just below the antecubital space. This was carried down to isolate the brachial artery. The artery was thickened with atherosclerotic disease but did have a good pulse. The vein was identified through the same incision. It was small to moderate size. Tributary branches of the brain vein were ligated and divided. The vein was ligated distally and was divided and mobilized the level the brachial artery. Brachial artery was occluded proximally and distally with fistula clamp and the artery was opened with 11 blade and sent longitudinally with Potts scissors. The vein was cut to appropriate length and was spatulated and sewn end-to-side to the artery with a running 6-0 Prolene suture. Clamps were removed and good thrill  was noted. The wounds were irrigated with saline. Hemostasis was obtained left cautery. The wounds were closed with 3-0 Vicryl in the subcutaneous and subcuticular tissue. Benzoin and Steri-Strips were applied   Curt Jews, M.D. 02/14/2015 12:34 PM

## 2015-02-14 NOTE — Anesthesia Preprocedure Evaluation (Signed)
Anesthesia Evaluation  Patient identified by MRN, date of birth, ID band Patient awake    Reviewed: Allergy & Precautions, NPO status , Patient's Chart, lab work & pertinent test results  Airway Mallampati: II  TM Distance: >3 FB Neck ROM: Full    Dental  (+) Teeth Intact   Pulmonary former smoker,    + decreased breath sounds      Cardiovascular hypertension, Rhythm:Regular     Neuro/Psych    GI/Hepatic   Endo/Other  diabetes  Renal/GU      Musculoskeletal   Abdominal   Peds  Hematology   Anesthesia Other Findings   Reproductive/Obstetrics                             Anesthesia Physical Anesthesia Plan  ASA: III  Anesthesia Plan: MAC   Post-op Pain Management:    Induction: Intravenous  Airway Management Planned: Simple Face Mask and Natural Airway  Additional Equipment:   Intra-op Plan:   Post-operative Plan:   Informed Consent: I have reviewed the patients History and Physical, chart, labs and discussed the procedure including the risks, benefits and alternatives for the proposed anesthesia with the patient or authorized representative who has indicated his/her understanding and acceptance.     Plan Discussed with: CRNA and Anesthesiologist  Anesthesia Plan Comments:         Anesthesia Quick Evaluation

## 2015-02-14 NOTE — Progress Notes (Signed)
Speech Language Pathology Treatment: Dysphagia  Patient Details Name: [name] [name] MRN: [id number] DOB: [birth year] Today's Date: 02/14/2015 Time: 3754-3606 SLP Time Calculation (min) (ACUTE ONLY): 19 min  Assessment / Plan / Recommendation Clinical Impression  F/u for dysphagia.  Pt with subjectively improved quality of phonation and strength of cough.  She was instructed in completion of the masako maneuver and towel-tuck exercise to address pharyngeal/base of tongue and submental strength.  Pt completed with mod I assist.  Consumed trials of ice chips with no overt s/s of aspiration.  Recommend repeat MBS on Friday, 4/15.  Pt agrees. Will follow.    HPI HPI: 54 y.o. female with PMH: lupus, CKD. DM, CVA (pons discharged on regular diet and thin 11/2014), lower GI bleed admitted with with shortness of breath. Pt experienced acute hypoxic respiratory failure on 3/20, requiring intubation; extubated 3/29. She required reintubation 3/30-4/4. Bedside swallow 01/30/15 completed revealing multiple s/s aspiration , reduced hyolaryngeal excursion. FEES 10/04/14 showed penetration of thin liquids during consumption of large, consecutive boluses. Pt with adequate sensation, and spontaneously cleared throat, which effectively expelled penetrate.CXR continued improvement in left perihilar airspace opacification. FEES recommended following BSE.   Pertinent Vitals Pain Assessment: No/denies pain  SLP Plan  Continue with current plan of care    Recommendations Diet recommendations: NPO (allow ice chips after oral care) Medication Administration: Via alternative means              Oral Care Recommendations: Oral care Q4 per protocol Follow up Recommendations: Inpatient Rehab Plan: Continue with current plan of care   Jocob Dambach L. Tivis Ringer, [place] CCC/SLP Pager [phone]      [name] [name] 02/14/2015, 3:08 PM

## 2015-02-14 NOTE — H&P (View-Only) (Signed)
[facility]  Patient seen in HD. She is on the schedule tomorrow for a left basilic vein transposition. The risks, benefits and procedure were discussed with the patient and she is willing to proceed. Obtain consent. Hold tube feeds past midnight.   Virgina Jock, PA-C Vascular Surgery [phone]

## 2015-02-14 NOTE — Progress Notes (Signed)
Assessment: 1. 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 Anemia on aranesp 3. 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 SLE 5. S/p BVT 4/13 1st stage  Plan: 1. HD Thurs 2. Working on outpt spot 3. Rehab if poss  Subjective: Interval History: S/p BVT  Objective: Vital signs in last 24 hours: Temp:  [98.1 F (36.7 [name])-98.7 F (37.1 [name])] 98.1 F (36.7 [name]) (04/13 1320) Pulse Rate:  [76-100] 84 (04/13 1330) Resp:  [12-17] 14 (04/13 1330) BP: (111-135)/(55-72) 111/55 mmHg (04/13 1300) SpO2:  [97 %-100 %] 97 % (04/13 1330) Weight:  [54.1 kg (119 lb 4.3 oz)] 54.1 kg (119 lb 4.3 oz) (04/13 0342) Weight change: -1.1 kg (-2 lb 6.8 oz)  Intake/Output from previous day: 04/12 0701 - 04/13 0700 In: 410 [NG/GT:410] Out: 1800 [Urine:300] Intake/Output this shift: Total I/O In: 150 [I.V.:150] Out: 5 [Blood:5]  General appearance: alert and cooperative Nose: Nares normal. Septum midline. Mucosa normal. No drainage or sinus tenderness., NGT Chest wall: no tenderness Cardio: regular rate and rhythm, S1, S2 normal, no murmur, click, rub or gallop Extremities: extremities normal, atraumatic, no cyanosis or edema  Lab Results:  Recent Labs  02/13/15 0500 02/14/15 0525  WBC 6.3 5.5  HGB 7.8* 7.8*  HCT 24.6* 26.2*  PLT 283 266   BMET:  Recent Labs  02/13/15 1600 02/14/15 0525  NA 137 137  138  K 4.2 4.0  3.9  CL 100 99  99  CO2 29 30  30   GLUCOSE 126* 89  89  BUN 12 24*  24*  CREATININE 1.90* 3.07*  3.08*  CALCIUM 9.1 9.6  9.6   No results for input(s): PTH in the last 72 hours. Iron Studies: No results for input(s): IRON, TIBC, TRANSFERRIN, FERRITIN in the last 72 hours. Studies/Results: Dg Abd Portable 1v  02/12/2015   CLINICAL DATA:  NGT placement.  EXAM: PORTABLE ABDOMEN - 1 VIEW  COMPARISON:  None.  FINDINGS: Tip of the weighted enteric tube is within the stomach. This is directed towards the pylorus. Mild gaseous gastric  distention. Nonobstructive bowel gas pattern.  IMPRESSION: Tip of the weighted enteric tube within the stomach, directed towards the pylorus.   Electronically Signed   By: Jeb Levering M.D.   On: 02/12/2015 21:49   Scheduled: . antiseptic oral rinse  7 mL Mouth Rinse q12n4p  . chlorhexidine  15 mL Mouth Rinse BID  . darbepoetin (ARANESP) injection - DIALYSIS  150 mcg Intravenous Q Tue-HD  . feeding supplement (PRO-STAT SUGAR FREE 64)  30 mL Per Tube Daily  . folic acid  1 mg Oral Daily  . free water  100 mL Per Tube TID  . heparin subcutaneous  5,000 Units Subcutaneous 3 times per day  . hydroxychloroquine  200 mg Oral BID  . labetalol  250 mg Per Tube 3 times per day  . multivitamin  1 tablet Oral QHS  . mycophenolate  500 mg Oral BID  . neomycin-bacitracin-polymyxin   Topical TID  . pantoprazole sodium  40 mg Per Tube QHS  . thiamine  100 mg Oral Daily     LOS: 26 days   [name] [name] 02/14/2015,2:26 PM

## 2015-02-15 ENCOUNTER — Inpatient Hospital Stay (HOSPITAL_COMMUNITY)
Admission: RE | Admit: 2015-02-15 | Discharge: 2015-02-22 | DRG: 947 | Disposition: A | Discharge status: Home Health | Payer: Managed Care, Other (non HMO) | Source: Intra-hospital | Attending: Physical Medicine & Rehabilitation | Admitting: Physical Medicine & Rehabilitation

## 2015-02-15 ENCOUNTER — Other Ambulatory Visit: Payer: Self-pay | Admitting: *Deleted

## 2015-02-15 ENCOUNTER — Encounter (HOSPITAL_COMMUNITY): Payer: Self-pay | Admitting: Vascular Surgery

## 2015-02-15 ENCOUNTER — Telehealth: Payer: Self-pay | Admitting: Vascular Surgery

## 2015-02-15 DIAGNOSIS — D649 Anemia, unspecified: Secondary | ICD-10-CM | POA: Diagnosis not present

## 2015-02-15 DIAGNOSIS — R5381 Other malaise: Secondary | ICD-10-CM | POA: Diagnosis present

## 2015-02-15 DIAGNOSIS — J9691 Respiratory failure, unspecified with hypoxia: Secondary | ICD-10-CM | POA: Diagnosis not present

## 2015-02-15 DIAGNOSIS — Z8673 Personal history of transient ischemic attack (TIA), and cerebral infarction without residual deficits: Secondary | ICD-10-CM | POA: Diagnosis not present

## 2015-02-15 DIAGNOSIS — N186 End stage renal disease: Secondary | ICD-10-CM

## 2015-02-15 DIAGNOSIS — I69391 Dysphagia following cerebral infarction: Secondary | ICD-10-CM | POA: Diagnosis not present

## 2015-02-15 DIAGNOSIS — I120 Hypertensive chronic kidney disease with stage 5 chronic kidney disease or end stage renal disease: Secondary | ICD-10-CM

## 2015-02-15 DIAGNOSIS — M329 Systemic lupus erythematosus, unspecified: Secondary | ICD-10-CM | POA: Diagnosis not present

## 2015-02-15 DIAGNOSIS — E119 Type 2 diabetes mellitus without complications: Secondary | ICD-10-CM | POA: Diagnosis not present

## 2015-02-15 DIAGNOSIS — Z992 Dependence on renal dialysis: Secondary | ICD-10-CM | POA: Diagnosis not present

## 2015-02-15 DIAGNOSIS — R1310 Dysphagia, unspecified: Secondary | ICD-10-CM

## 2015-02-15 DIAGNOSIS — N2581 Secondary hyperparathyroidism of renal origin: Secondary | ICD-10-CM | POA: Diagnosis not present

## 2015-02-15 DIAGNOSIS — I471 Supraventricular tachycardia: Secondary | ICD-10-CM

## 2015-02-15 DIAGNOSIS — G629 Polyneuropathy, unspecified: Secondary | ICD-10-CM | POA: Diagnosis not present

## 2015-02-15 DIAGNOSIS — Z4931 Encounter for adequacy testing for hemodialysis: Secondary | ICD-10-CM

## 2015-02-15 DIAGNOSIS — G5790 Unspecified mononeuropathy of unspecified lower limb: Secondary | ICD-10-CM | POA: Diagnosis present

## 2015-02-15 DIAGNOSIS — Z79899 Other long term (current) drug therapy: Secondary | ICD-10-CM

## 2015-02-15 LAB — CBC
HCT: 25.6 % — ABNORMAL LOW (ref 36.0–46.0)
Hemoglobin: 7.8 g/dL — ABNORMAL LOW (ref 12.0–15.0)
MCH: 28.5 pg (ref 26.0–34.0)
MCHC: 30.5 g/dL (ref 30.0–36.0)
MCV: 93.4 fL (ref 78.0–100.0)
PLATELETS: 269 10*3/uL (ref 150–400)
RBC: 2.74 MIL/uL — ABNORMAL LOW (ref 3.87–5.11)
RDW: 18.5 % — AB (ref 11.5–15.5)
WBC: 5.6 10*3/uL (ref 4.0–10.5)

## 2015-02-15 LAB — RENAL FUNCTION PANEL
ALBUMIN: 2.4 g/dL — AB (ref 3.5–5.2)
Anion gap: 13 (ref 5–15)
BUN: 32 mg/dL — AB (ref 6–23)
CALCIUM: 9.5 mg/dL (ref 8.4–10.5)
CO2: 28 mmol/L (ref 19–32)
Chloride: 96 mmol/L (ref 96–112)
Creatinine, Ser: 4.59 mg/dL — ABNORMAL HIGH (ref 0.50–1.10)
GFR calc Af Amer: 12 mL/min — ABNORMAL LOW (ref >90)
GFR calc non Af Amer: 10 mL/min — ABNORMAL LOW (ref >90)
Glucose, Bld: 111 mg/dL — ABNORMAL HIGH (ref 70–99)
PHOSPHORUS: 5.4 mg/dL — AB (ref 2.3–4.6)
Potassium: 3.8 mmol/L (ref 3.5–5.1)
Sodium: 137 mmol/L (ref 135–145)

## 2015-02-15 LAB — GLUCOSE, CAPILLARY
GLUCOSE-CAPILLARY: 109 mg/dL — AB (ref 70–99)
GLUCOSE-CAPILLARY: 113 mg/dL — AB (ref 70–99)
Glucose-Capillary: 108 mg/dL — ABNORMAL HIGH (ref 70–99)
Glucose-Capillary: 110 mg/dL — ABNORMAL HIGH (ref 70–99)
Glucose-Capillary: 114 mg/dL — ABNORMAL HIGH (ref 70–99)
Glucose-Capillary: 126 mg/dL — ABNORMAL HIGH (ref 70–99)
Glucose-Capillary: 96 mg/dL (ref 70–99)

## 2015-02-15 LAB — CLOSTRIDIUM DIFFICILE BY PCR: Toxigenic C. Difficile by PCR: NEGATIVE

## 2015-02-15 MED ORDER — OXYCODONE-ACETAMINOPHEN 5-325 MG PO TABS
1.0000 | ORAL_TABLET | ORAL | Status: DC | PRN
  Administered 2015-02-17 – 2015-02-20 (×2): 1 via ORAL
  Filled 2015-02-15 (×2): qty 1

## 2015-02-15 MED ORDER — FERROUS SULFATE 325 (65 FE) MG PO TABS
325.0000 mg | ORAL_TABLET | Freq: Two times a day (BID) | ORAL | Status: DC
  Administered 2015-02-15 – 2015-02-21 (×13): 325 mg via ORAL
  Filled 2015-02-15 (×16): qty 1

## 2015-02-15 MED ORDER — ONDANSETRON HCL 4 MG PO TABS: 4.0000 mg | ORAL_TABLET | Freq: Four times a day (QID) | ORAL | Status: DC | PRN

## 2015-02-15 MED ORDER — FENTANYL CITRATE 0.05 MG/ML IJ SOLN: 25.0000 ug | INTRAMUSCULAR | Status: DC | PRN

## 2015-02-15 MED ORDER — LIDOCAINE HCL (PF) 1 % IJ SOLN: 5.0000 mL | INTRAMUSCULAR | Status: DC | PRN

## 2015-02-15 MED ORDER — PANTOPRAZOLE SODIUM 40 MG PO PACK
40.0000 mg | PACK | Freq: Every day | ORAL | Status: DC
  Administered 2015-02-15 – 2015-02-20 (×6): 40 mg
  Filled 2015-02-15 (×7): qty 20

## 2015-02-15 MED ORDER — BACITRACIN-NEOMYCIN-POLYMYXIN OINTMENT TUBE
TOPICAL_OINTMENT | Freq: Three times a day (TID) | CUTANEOUS | Status: DC
  Administered 2015-02-15 – 2015-02-16 (×2): via TOPICAL
  Administered 2015-02-16: 1 via TOPICAL
  Administered 2015-02-16 – 2015-02-18 (×7): via TOPICAL
  Administered 2015-02-19: 1 via TOPICAL
  Administered 2015-02-19 – 2015-02-21 (×8): via TOPICAL
  Filled 2015-02-15: qty 15

## 2015-02-15 MED ORDER — HEPARIN SODIUM (PORCINE) 5000 UNIT/ML IJ SOLN
5000.0000 [IU] | Freq: Three times a day (TID) | INTRAMUSCULAR | Status: DC
  Administered 2015-02-15 – 2015-02-22 (×20): 5000 [IU] via SUBCUTANEOUS
  Filled 2015-02-15 (×23): qty 1

## 2015-02-15 MED ORDER — FREE WATER
100.0000 mL | Freq: Three times a day (TID) | Status: DC
Start: 2015-02-15 — End: 2015-02-16
  Administered 2015-02-15 – 2015-02-16 (×2): 100 mL

## 2015-02-15 MED ORDER — SODIUM CHLORIDE 0.9 % IV SOLN: 100.0000 mL | INTRAVENOUS | Status: DC | PRN

## 2015-02-15 MED ORDER — SORBITOL 70 % SOLN: 30.0000 mL | Freq: Every day | Status: DC | PRN

## 2015-02-15 MED ORDER — PRO-STAT SUGAR FREE PO LIQD
30.0000 mL | Freq: Every day | ORAL | Status: DC
  Filled 2015-02-15 (×2): qty 30

## 2015-02-15 MED ORDER — HEPARIN SODIUM (PORCINE) 5000 UNIT/ML IJ SOLN: 5000.0000 [IU] | Freq: Three times a day (TID) | INTRAMUSCULAR | Status: DC

## 2015-02-15 MED ORDER — HYDROCORTISONE ACE-PRAMOXINE 1-1 % RE FOAM: 1.0000 | Freq: Two times a day (BID) | RECTAL | Status: DC | PRN

## 2015-02-15 MED ORDER — NEPRO/CARBSTEADY PO LIQD: 237.0000 mL | ORAL | Status: DC | PRN

## 2015-02-15 MED ORDER — ACETAMINOPHEN 325 MG PO TABS
650.0000 mg | ORAL_TABLET | Freq: Four times a day (QID) | ORAL | Status: DC | PRN
  Administered 2015-02-16 – 2015-02-19 (×2): 650 mg via ORAL
  Filled 2015-02-15 (×2): qty 2

## 2015-02-15 MED ORDER — PENTAFLUOROPROP-TETRAFLUOROETH EX AERO: 1.0000 "application " | INHALATION_SPRAY | CUTANEOUS | Status: DC | PRN

## 2015-02-15 MED ORDER — HYDROXYCHLOROQUINE SULFATE 200 MG PO TABS
200.0000 mg | ORAL_TABLET | Freq: Two times a day (BID) | ORAL | Status: DC
  Administered 2015-02-15 – 2015-02-22 (×14): 200 mg via ORAL
  Filled 2015-02-15 (×16): qty 1

## 2015-02-15 MED ORDER — LABETALOL HCL 200 MG PO TABS
250.0000 mg | ORAL_TABLET | Freq: Three times a day (TID) | ORAL | Status: DC
  Administered 2015-02-15 – 2015-02-16 (×2): 250 mg
  Filled 2015-02-15 (×5): qty 0.5

## 2015-02-15 MED ORDER — DARBEPOETIN ALFA 150 MCG/0.3ML IJ SOSY
150.0000 ug | PREFILLED_SYRINGE | INTRAMUSCULAR | Status: DC
  Administered 2015-02-20: 150 ug via INTRAVENOUS
  Filled 2015-02-15: qty 0.3

## 2015-02-15 MED ORDER — OXYCODONE HCL 5 MG/5ML PO SOLN: 5.0000 mg | Freq: Once | ORAL | Status: DC | PRN

## 2015-02-15 MED ORDER — MYCOPHENOLATE 200 MG/ML ORAL SUSPENSION
500.0000 mg | Freq: Two times a day (BID) | ORAL | Status: DC
  Administered 2015-02-15 – 2015-02-20 (×10): 500 mg via ORAL
  Filled 2015-02-15 (×13): qty 10

## 2015-02-15 MED ORDER — ALTEPLASE 2 MG IJ SOLR
2.0000 mg | Freq: Once | INTRAMUSCULAR | Status: DC | PRN
  Filled 2015-02-15: qty 2

## 2015-02-15 MED ORDER — NEPRO/CARBSTEADY PO LIQD
1000.0000 mL | ORAL | Status: DC
  Filled 2015-02-15 (×2): qty 1000

## 2015-02-15 MED ORDER — LIDOCAINE-PRILOCAINE 2.5-2.5 % EX CREA: 1.0000 "application " | TOPICAL_CREAM | CUTANEOUS | Status: DC | PRN

## 2015-02-15 MED ORDER — ONDANSETRON HCL 4 MG/2ML IJ SOLN: 4.0000 mg | Freq: Once | INTRAMUSCULAR | Status: DC | PRN

## 2015-02-15 MED ORDER — ONDANSETRON HCL 4 MG/2ML IJ SOLN: 4.0000 mg | Freq: Four times a day (QID) | INTRAMUSCULAR | Status: DC | PRN

## 2015-02-15 MED ORDER — CHLORHEXIDINE GLUCONATE 0.12 % MT SOLN
15.0000 mL | Freq: Two times a day (BID) | OROMUCOSAL | Status: DC
  Administered 2015-02-16 – 2015-02-21 (×11): 15 mL via OROMUCOSAL
  Filled 2015-02-15 (×15): qty 15

## 2015-02-15 MED ORDER — HEPARIN SODIUM (PORCINE) 1000 UNIT/ML DIALYSIS
20.0000 [IU]/kg | INTRAMUSCULAR | Status: DC | PRN
  Filled 2015-02-15: qty 2

## 2015-02-15 MED ORDER — FOLIC ACID 1 MG PO TABS
1.0000 mg | ORAL_TABLET | Freq: Every day | ORAL | Status: DC
  Administered 2015-02-16 – 2015-02-22 (×7): 1 mg via ORAL
  Filled 2015-02-15 (×8): qty 1

## 2015-02-15 MED ORDER — RENA-VITE PO TABS
1.0000 | ORAL_TABLET | Freq: Every day | ORAL | Status: DC
  Administered 2015-02-15 – 2015-02-21 (×6): 1 via ORAL
  Filled 2015-02-15 (×8): qty 1

## 2015-02-15 MED ORDER — VITAMIN B-1 100 MG PO TABS
100.0000 mg | ORAL_TABLET | Freq: Every day | ORAL | Status: DC
  Administered 2015-02-16 – 2015-02-22 (×7): 100 mg via ORAL
  Filled 2015-02-15 (×8): qty 1

## 2015-02-15 MED ORDER — HEPARIN SODIUM (PORCINE) 1000 UNIT/ML DIALYSIS: 1000.0000 [IU] | INTRAMUSCULAR | Status: DC | PRN

## 2015-02-15 MED ORDER — OXYCODONE HCL 5 MG PO TABS: 5.0000 mg | ORAL_TABLET | Freq: Once | ORAL | Status: DC | PRN

## 2015-02-15 MED ORDER — HEPARIN SODIUM (PORCINE) 1000 UNIT/ML DIALYSIS
1000.0000 [IU] | INTRAMUSCULAR | Status: DC | PRN
  Filled 2015-02-15: qty 1

## 2015-02-15 NOTE — Plan of Care (Signed)
Problem: Phase II Progression Outcomes Goal: Discharge plan established Outcome: Completed/Met Date Met:  02/15/15 D/C to CIR today.  Problem: Phase III Progression Outcomes Goal: Voiding independently Outcome: Progressing With assist to [facility].  Problem: Discharge Progression Outcomes Goal: Tolerating diet Outcome: Adequate for Discharge On tube feeds currently.

## 2015-02-15 NOTE — Telephone Encounter (Signed)
Unable to leave a message, mailed letter to home address, dpm

## 2015-02-15 NOTE — Progress Notes (Signed)
Assessment: 1. 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 Anemia on aranesp 3. 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 SLE 5. S/p BVT 4/13 1st stage  Plan: 1. HD Today done 2. Working on outpt spot for HD 3. Rehab transfer today  Subjective: Interval History: Tolerated HD today  Objective: Vital signs in last 24 hours: Temp:  [97.9 F (36.6 C)-98.5 F (36.9 C)] 98 F (36.7 C) (04/14 1001) Pulse Rate:  [76-104] 96 (04/14 1001) Resp:  [12-16] 14 (04/14 1001) BP: (111-142)/(55-73) 123/68 mmHg (04/14 1001) SpO2:  [97 %-100 %] 98 % (04/14 1001) Weight:  [54.5 kg (120 lb 2.4 oz)] 54.5 kg (120 lb 2.4 oz) (04/14 0658) Weight change: 0.2 kg (7.1 oz)  Intake/Output from previous day: 04/13 0701 - 04/14 0700 In: 150 [I.V.:150] Out: 7 [Stool:2; Blood:5] Intake/Output this shift: Total I/O In: 100 [NG/GT:100] Out: 1500 [Other:1500]  General appearance: alert and cooperative Nose: no discharge, NGT GI: soft, non-tender; bowel sounds normal; no masses,  no organomegaly Extremities: extremities normal, atraumatic, no cyanosis or edema  LUE AVF  Lab Results:  Recent Labs  02/14/15 0525 02/15/15 0705  WBC 5.5 5.6  HGB 7.8* 7.8*  HCT 26.2* 25.6*  PLT 266 269   BMET:  Recent Labs  02/14/15 0525 02/15/15 0705  NA 137  138 137  K 4.0  3.9 3.8  CL 99  99 96  CO2 30  30 28   GLUCOSE 89  89 111*  BUN 24*  24* 32*  CREATININE 3.07*  3.08* 4.59*  CALCIUM 9.6  9.6 9.5   No results for input(s): PTH in the last 72 hours. Iron Studies: No results for input(s): IRON, TIBC, TRANSFERRIN, FERRITIN in the last 72 hours. Studies/Results: No results found.  Scheduled: . antiseptic oral rinse  7 mL Mouth Rinse q12n4p  . chlorhexidine  15 mL Mouth Rinse BID  . darbepoetin (ARANESP) injection - DIALYSIS  150 mcg Intravenous Q Tue-HD  . feeding supplement (PRO-STAT SUGAR FREE 64)  30 mL Per Tube Daily  . ferrous sulfate  325 mg Oral BID WC  .  folic acid  1 mg Oral Daily  . free water  100 mL Per Tube TID  . heparin subcutaneous  5,000 Units Subcutaneous 3 times per day  . hydroxychloroquine  200 mg Oral BID  . labetalol  250 mg Per Tube 3 times per day  . multivitamin  1 tablet Oral QHS  . mycophenolate  500 mg Oral BID  . neomycin-bacitracin-polymyxin   Topical TID  . pantoprazole sodium  40 mg Per Tube QHS  . thiamine  100 mg Oral Daily     LOS: 27 days   Desiray Orchard C 02/15/2015,12:23 PM

## 2015-02-15 NOTE — Discharge Summary (Signed)
Physician Discharge Summary  [name] [name] UUV:[id number] DOB: 05-03-1961 DOA: 01/18/2015  PCP: Abbe Amsterdam, MD  Admit date: 01/18/2015 Discharge date: 02/15/2015  Time spent: 35 minutes  Recommendations for Outpatient Follow-up:  1. needs continue PT. 2. Repeat Swallow evaluation.   Discharge Diagnoses:    Acute respiratory failure with hypoxia   Systemic lupus erythematosus   Essential hypertension   Anemia   SLE (systemic lupus erythematosus)   CKD (chronic kidney disease) stage 4, GFR 15-29 ml/min   Diarrhea   Shortness of breath   Weakness   Elevated troponin level   Hypotension   Absolute anemia   Tachycardia   ARDS (adult respiratory distress syndrome)   Atelectasis, left   Confusion   Encephalopathy acute   Debility   Acute respiratory failure   SVT (supraventricular tachycardia)   Other specified hypotension   Acute diastolic CHF (congestive heart failure)   Protein-calorie malnutrition   Discharge Condition: Stable.   Diet recommendation: Renal diet   Filed Weights   02/13/15 1234 02/14/15 0342 02/15/15 0658  Weight: 54.1 kg (119 lb 4.3 oz) 54.1 kg (119 lb 4.3 oz) 54.5 kg (120 lb 2.4 oz)    History of present illness:  [name] [name] is a 54 y.o. female presents with shortness of breath. Patient states that she has been experiencing shortness of breath since yesterday. Patient states that she was also having cold like symptoms. Patient states that she has had a cough and some congestion. She states that she has some white mucus coming up. She also admits to having a sore throat. She states taht she has not yet been treated for this. In addition she had labwork in the ED and this shows significant anemia. Patient has been having anemia for some time. She was recently in the hospital for the anemia but a workup was not conclusive. She states that she has been taking iron. Also she had a minimally elevated troponin level. She sttes her daughter did have a sore  throat on [facility] Hospital Course:  54 y/o female with lupus (on cellcept, plaquenil) and associated CKD, DM, and HTN who presented 3/17 with vague c/o SOB, diarrhea, malaise, weakness, and cough. She was admitted by [facility] and r/o for flu, and was undergoing further w/u for diarrhea, mildly elevated troponin and anemia. Overnight 3/19 she developed intermittent SVT, then persistent tachycardia with HR 150's and worsening SOB and hypoxia requiring NRB. PCCM consulted 3/20. She required ETT/MV 3/20. CXR with B infiltrates. Initiated CVVHD on 3/21. Awaiting Clipping so patient can possible go to CIR?  HPI/Subjective: Alert, denies abdominal pain. Soreness in her mouth has improved.   Assessment/Plan: Acute hypoxic respiratory failure - acute pulmonary edema in setting HTN and renal failure infiltrates have completely cleared -sats 96% on RA - resolved   SVT/sinus tachycardia HR 150 at time of her acute decompensation, Adenosine given, appeared to be sinus with some non-conducted p-waves - presumed reactive to critical illness - PE was questioned butBLE venous dopplers negative; doubt PE (heparin gtt stopped 3/23) - still having intermittent tachy, but not as severe - TSH normal - AM cortisol ok Continue with labetalol.   Protein Calorie Malnutrition  Tube feeds presently - NPO since procedure on 4/6 -SLP to re-evaluate 4-15  Hypotension BP has now normalized  Continue to hold home BP medications.   Newly diagnosed Diastolic CHF -EF 47-42% with MOD aortic, tricuspid and mitral regurg - volume management per HD  - Labetalol to 250 mg TID  Acute on chronic renal failure, presume lupus nephritis -As per Nephrology - ongoing HD - S/P AVF   SLE -continue Cellcept and Plaq  Anemia -s/p 1 unit PRBC 3/22 - Hgb presently stable  -start ferrous sulfate.   Hyponatremia  -Corrected w/ HD   Metabolic acidosis -Corrected w/ HD   Diarrhea / Abdom pain  Improved.   flexiseal out.   Acute encephalopathy - fluctuating -Head Ct neg 3/27 - EEG 3/30 metabolic enceph - MRI 3/30 > Remote RIGHT inferior brainstem hemorrhage, stable RIGHT basal ganglia micro hemorrhage, Remote small RIGHT inferior cerebellar infarct - CSF 3/31 neg for lupus cerebri - Neuro signed off on 4/3 Alert, following command.   Procedures: 3/19 TTE EF40-45%, grade 1 diastolic dysfunction, mod AR, mod MR, mod TR. No pericardial effusion 3/20 venous duplex B LE - negative 3/20 intubated  3/21 Adenosine challenge > sinus rhythm with some non-conducted p-waves 3/23 CVVHD, I/O even last 24 h, tachycardia improved, fent + propofol 3/24 high residuals, on max dose fentanyl. Rx'd with Reglan  3/26 continued agitation with WUA / SBT, changed back to propofol/fentanyl  3/28 seroquel trance, neuro consult 3/29 more awake, follows commands - extubated  3/30 reintubated, bronchoscopy - for LLL atx 4/03 fentanyl drip stopped 4/04 tunneled cath placed 4/04 extubated   Consultations: Dr Primitivo Gauze. (Nephrology) Lester Kinsman (Neurology) Dr.David B Simonds [facility] M)   Dr.Brian Rolena Infante (vascular surgery)  Discharge Exam: Filed Vitals:   02/15/15 1001  BP: 123/68  Pulse: 96  Temp: 98 F (36.7 C)  Resp: 14    General: Alert in no distress, NG tube in place.  Cardiovascular: S 1, S 2 RRR Respiratory: CTA  Discharge Instructions    Current Discharge Medication List    CONTINUE these medications which have NOT CHANGED   Details  acetaminophen (TYLENOL) 325 MG tablet Take 2 tablets (650 mg total) by mouth every 4 (four) hours as needed for mild pain.    ALPRAZolam (XANAX) 0.5 MG tablet Take 1 tablet (0.5 mg total) by mouth 2 (two) times daily as needed for anxiety. Qty: 15 tablet, Refills: 0    gabapentin (NEURONTIN) 100 MG capsule Take 1 capsule (100 mg total) by mouth at bedtime. Qty: 30 capsule, Refills: 0   Associated Diagnoses: Neuropathic  pain    labetalol (NORMODYNE) 300 MG tablet Take 2 tablets (600 mg total) by mouth 2 (two) times daily. Qty: 120 tablet, Refills: 0    mycophenolate (CELLCEPT) 250 MG capsule Take 4 capsules (1,000 mg total) by mouth 2 (two) times daily. Qty: 320 capsule, Refills: 1    senna-docusate (SENOKOT-S) 8.6-50 MG per tablet Take 1 tablet by mouth 2 (two) times daily. Qty: 60 tablet, Refills: 1    Vitamin D, Ergocalciferol, (DRISDOL) 50000 UNITS CAPS capsule Take 1 capsule (50,000 Units total) by mouth every Wednesday. Qty: 4 capsule, Refills: 1    atorvastatin (LIPITOR) 20 MG tablet Take 1 tablet (20 mg total) by mouth daily at 6 PM. Qty: 30 tablet, Refills: 1    cloNIDine (CATAPRES) 0.3 MG tablet Take 1 tablet (0.3 mg total) by mouth 3 (three) times daily. Qty: 90 tablet, Refills: 1    clotrimazole (MYCELEX) 10 MG troche Take 1 tablet (10 mg total) by mouth 5 (five) times daily. Qty: 30 tablet, Refills: 0    cyclobenzaprine (FLEXERIL) 5 MG tablet Take 5 mg by mouth 3 (three) times daily as needed for muscle spasms.     ferrous sulfate 325 (65 FE) MG tablet  Take 1 tablet (325 mg total) by mouth daily with breakfast. Qty: 30 tablet, Refills: 1    hydrALAZINE (APRESOLINE) 25 MG tablet Take 25 mg by mouth every 8 (eight) hours. Refills: 0    hydroxychloroquine (PLAQUENIL) 200 MG tablet Take 2 tablets (400 mg total) by mouth daily. Qty: 30 tablet, Refills: 1    lidocaine (XYLOCAINE) 2 % solution     Menthol-Methyl Salicylate (MUSCLE RUB) 10-15 % CREA Apply 1 application topically 3 (three) times daily before meals. Qty: 85 g, Refills: 0    multivitamin (RENA-VIT) TABS tablet Take 1 tablet by mouth daily.    nystatin-triamcinolone (MYCOLOG II) cream Apply topically 2 (two) times daily. Refills: 0    nystatin-triamcinolone ointment (MYCOLOG) Apply 1 application topically 2 (two) times daily. Qty: 30 g, Refills: 0    pantoprazole (PROTONIX) 40 MG tablet Take 1 tablet (40 mg total) by  mouth daily. Qty: 30 tablet, Refills: 11    pramoxine-hydrocortisone (PROCTOCREAM-HC) 1-1 % rectal cream Place 1 application rectally 2 (two) times daily. Qty: 30 g, Refills: 1      STOP taking these medications     sodium bicarbonate 650 MG tablet      diclofenac sodium (VOLTAREN) 1 % GEL      furosemide (LASIX) 80 MG tablet        Allergies  Allergen Reactions  . Sulfa Antibiotics Itching   Follow-up Information    Follow up with EARLY, TODD, MD In 4 weeks.   Specialty:  Vascular Surgery   Why:  Our office will call you to arrange an appointment (sent)   Contact information:   [address] [place] [address] [phone]        The results of significant diagnostics from this hospitalization (including imaging, microbiology, ancillary and laboratory) are listed below for reference.    Significant Diagnostic Studies: Dg Abd 1 View  01/31/2015   CLINICAL DATA:  Feeding tube placement.  EXAM: ABDOMEN - 1 VIEW  COMPARISON:  None.  FINDINGS: Feeding tube has been repositioned. Feeding tube tip is no longer in the esophagus. Feeding tube appears be in the stomach with tip projected over the upper fundus.  IMPRESSION: Feeding tube repositioning. Feeding tube tip is no longer in the esophagus. The distal tip appears to be now in the upper fundus. More distal placement may prove useful. These results will be called to the ordering clinician or representative by the Radiologist Assistant, and communication documented in the PACS or zVision Dashboard.   Electronically Signed   By: Maisie Fus  Register   On: 01/31/2015 13:54   Ct Head Wo Contrast  01/28/2015   CLINICAL DATA:  Acute encephalopathy, history of brainstem hemorrhage  EXAM: CT HEAD WITHOUT CONTRAST  TECHNIQUE: Contiguous axial images were obtained from the base of the skull through the vertex without intravenous contrast.  COMPARISON:  10/11/2014 brain MRI, most recent head CT 10/04/2014  FINDINGS: Endotracheal tube partly  visualized. Mild pansinusitis with mucoperiosteal thickening noted. Orbits are unremarkable. No skull fracture. No acute hemorrhage, infarct, or mass lesion is identified. Minimal periventricular white matter hypodensity likely indicating small vessel ischemic change is reidentified.  IMPRESSION: No acute intracranial abnormality.   Electronically Signed   By: Christiana Pellant M.D.   On: 01/28/2015 11:15   Mr Brain Wo Contrast  02/01/2015   CLINICAL DATA:  Altered mental status, not improving. Worsening renal function, suspected metabolic encephalopathy. Assess for lupus cerebritis. History of stroke, lupus, diabetes, metabolic acidosis.  EXAM: MRI HEAD  WITHOUT CONTRAST  TECHNIQUE: Multiplanar, multiecho pulse sequences of the brain and surrounding structures were obtained without intravenous contrast.  COMPARISON:  CT of the head January 28, 2015 and MRI of the brain October 11, 2014  FINDINGS: No reduced diffusion to suggest acute ischemia. Focal susceptibility in RIGHT inferior brainstem corresponding to known prior hemorrhage. Stable small focus of susceptibility artifact in RIGHT mesial caudate. No new areas of hemorrhage. No midline shift or mass effect.  Mild ventriculomegaly, likely on the basis of global parenchymal brain volume loss as there is overall commensurate enlargement of cerebral sulci and cerebellar folia, advanced for age. Tiny remote RIGHT cerebellar infarct. Mild white matter changes appear improved, including pontine T2 hyperintensities without midline shift, mass effect or mass lesions.  No abnormal extra-axial fluid collections. Normal major intracranial vascular flow voids seen at the skull base.  Bilateral mastoid effusions. Mild paranasal sinus mucosal thickening with layering secretions in the naso and oropharynx, nasogastric tube in place. Ocular globes and orbital contents are unremarkable though not tailored for evaluation. No abnormal sellar expansion. No cerebellar tonsillar  ectopia. No suspicious calvarial bone marrow signal.  IMPRESSION: No acute intracranial process.  Remote RIGHT inferior brainstem hemorrhage, stable RIGHT basal ganglia micro hemorrhage, no new hemorrhage.  Mild parenchymal brain volume loss, advanced for age. Mild decreased white matter changes (which could be technical) and may reflect chronic small vessel ischemic disease, nonspecific, advanced for age . Remote small RIGHT inferior cerebellar infarct.  Mastoid effusions, mild paranasal sinusitis with life-support lines in place.   Electronically Signed   By: Awilda Metro   On: 02/01/2015 00:32   Dg Chest Port 1 View  02/05/2015   CLINICAL DATA:  Dialysis catheter insertion  EXAM: PORTABLE CHEST - 1 VIEW  COMPARISON:  Study obtained earlier in the day  FINDINGS: Dual-lumen catheter present with tips near the cavoatrial junction. Left jugular catheter tip is in the superior vena cava. Feeding tube tip is below the diaphragm. Endotracheal tube is been removed. No pneumothorax. No edema or consolidation. Heart is upper normal in size with pulmonary vascular within normal limits. No adenopathy.  IMPRESSION: Tube and catheter positions as described without pneumothorax. No edema or consolidation.   Electronically Signed   By: Bretta Bang III M.D.   On: 02/05/2015 13:47   Dg Chest Port 1 View  02/05/2015   CLINICAL DATA:  Acute respiratory failure, subsequent encounter.  EXAM: PORTABLE CHEST - 1 VIEW  COMPARISON:  02/04/2015.  FINDINGS: Endotracheal tube terminates approximately 5.3 cm above the carina. Feeding tube is followed into the stomach. Right IJ and left IJ central line tips project over the upper SVC. Heart size stable. Continued regression of left perihilar airspace opacification. Linear opacities are again seen in the left lower lobe. Lungs are otherwise clear. No pleural fluid.  IMPRESSION: Continued improvement in left perihilar airspace opacification. Probable atelectasis in the left lower  lobe, stable.   Electronically Signed   By: Leanna Battles M.D.   On: 02/05/2015 07:21   Dg Chest Port 1 View  02/04/2015   CLINICAL DATA:  Acute respiratory failure. Lupus. Chronic kidney disease. On ventilator.  EXAM: PORTABLE CHEST - 1 VIEW  COMPARISON:  02/03/2015  FINDINGS: Support lines and tubes in appropriate position. No pneumothorax identified.  Decreased airspace disease is seen in the left upper lobe since previous study. There is persistent airspace disease in the retrocardiac left lower lobe. Right lung remains clear. Heart size is within normal limits. No definite  pleural effusion seen.  IMPRESSION: Interval improvement in left upper lobe airspace disease.  No significant change in retrocardiac left lower lobe airspace disease.   Electronically Signed   By: Myles Rosenthal M.D.   On: 02/04/2015 09:54   Dg Chest Port 1 View  02/03/2015   CLINICAL DATA:  Acute respiratory failureHx of collagen vascular disease, DM, HTN, stenosis of cervical spine region, chronic ankle pain, anemia  EXAM: PORTABLE CHEST - 1 VIEW  COMPARISON:  02/02/2015  FINDINGS: Residual opacity in the left lung base is similar to the previous day's study, likely atelectasis. Remainder of the lungs is clear. No pneumothorax. Possible minimal left effusion.  Bilateral internal jugular central venous lines, endotracheal tube and enteric tube are stable. Cardiac silhouette normal in size. No mediastinal hilar masses.  IMPRESSION: 1. No change from the previous day's study. 2. Mild residual left lung base opacity most likely atelectasis. Possible small left effusion. No convincing pneumonia or pulmonary edema. 3. Support apparatus is stable and well positioned.   Electronically Signed   By: Amie Portland M.D.   On: 02/03/2015 09:19   Dg Chest Port 1 View  02/02/2015   CLINICAL DATA:  Hypoxia/respiratory failure  EXAM: PORTABLE CHEST - 1 VIEW  COMPARISON:  February 01, 2015  FINDINGS: Endotracheal tube tip is 5.4 cm above the carina.  Feeding tube tip is below the diaphragm. Central catheter tips are in the superior vena cava. No pneumothorax. There is a small area of infiltrate in the left lower lobe, less than on 1 day prior. Lungs elsewhere clear. Heart size and pulmonary vascularity are normal. No adenopathy. No bone lesions.  IMPRESSION: Partial but incomplete clearing of infiltrate from left lower lobe. No new opacity. No change in cardiac silhouette. Tube and catheter positions as described without pneumothorax.   Electronically Signed   By: Bretta Bang III M.D.   On: 02/02/2015 07:47   Portable Chest Xray  02/01/2015   CLINICAL DATA:  Respiratory failure.  EXAM: PORTABLE CHEST - 1 VIEW  COMPARISON:  01/31/2015 .  FINDINGS: Endotracheal tube, bilateral IJ lines, feeding tube in stable position. Heart size pulmonary vascularity stable. Persistent left lower lobe atelectasis and/or infiltrate. Small left pleural effusion cannot be excluded. No pneumothorax. No acute osseous abnormality.  IMPRESSION: 1. Lines and tubes in stable position. 2. Persistent left lower lobe atelectasis and/or infiltrate. Small left pleural effusion cannot be excluded.   Electronically Signed   By: Maisie Fus  Register   On: 02/01/2015 07:30   Dg Chest Port 1 View  01/31/2015   CLINICAL DATA:  Respiratory difficulty, collagen vascular disease, chronic kidney disease  EXAM: PORTABLE CHEST - 1 VIEW  COMPARISON:  Portable chest x-ray of 01/31/2015 and 01/29/2015  FINDINGS: Since the chest x-ray of 01/29/2015 the patient has developed opacity within the left lung base suspicious for left lower lobe pneumonia and possibly atelectasis. There may be a small left pleural effusion present. The right lung is clear. Right central venous line tip is unchanged in position. The tip of the endotracheal tube appears to be approximately 6.4 cm above the carina. Left IJ central venous line tip overlies the mid upper SVC. Heart size is stable.  IMPRESSION: 1. Left basilar  opacity consistent with atelectasis, pneumonia, and possibly a small left pleural effusion. Two views of the chest would be helpful if possible. 2. Tip of endotracheal tube approximately 6.4 cm above the carina.   Electronically Signed   By: Lucienne Minks.D.  On: 01/31/2015 16:50   Portable Chest Xray  01/31/2015   CLINICAL DATA:  Acute respiratory failure ; status post intubation of the trachea.  EXAM: PORTABLE CHEST - 1 VIEW  COMPARISON:  Portable chest x-ray of January 29, 2015  FINDINGS: The endotracheal tube tip lies approximately 6.4 cm above the carina. The Dobhoff type feeding tube tip enters the stomach and the Hg fill bulb lies in the region of the gastric cardia. The right internal jugular Cordis sheath tip projects over the proximal portion of the SVC. The left internal jugular venous catheter tip projects over the proximal SVC as well. There is no postprocedure pneumothorax. There is dense consolidation of the left lower lobe with obscuration of the hemidiaphragm. The cardiac silhouette and pulmonary vascularity are normal. There is no right pleural effusion. There is no pneumothorax.  IMPRESSION: 1. New dense infiltrate and/or pleural effusion in the left lower hemithorax. 2. Appropriate positioning of the endotracheal tube and other support tubes. Distal migration of the Dobhoff tube is needed prior to feeding.   Electronically Signed   By: David  [place]   On: 01/31/2015 13:49   Dg Chest Port 1 View  01/29/2015   CLINICAL DATA:  Acute respiratory failure  EXAM: PORTABLE CHEST - 1 VIEW  COMPARISON:  01/28/2015  FINDINGS: Endotracheal tube tip just below the clavicular heads. The orogastric tube reaches the stomach. Bilateral IJ catheters are in stable and unremarkable position.  Normal heart size and aortic contours. The lungs remain clear. A trace left pleural effusion has developed.  IMPRESSION: 1. Stable positioning of tubes and lines. 2. Trace left pleural effusion.   Electronically Signed    By: Marnee Spring M.D.   On: 01/29/2015 07:29   Dg Chest Port 1 View  01/28/2015   CLINICAL DATA:  Acute respiratory failure  EXAM: PORTABLE CHEST - 1 VIEW  COMPARISON:  01/27/2015  FINDINGS: Right IJ central line tip terminates over the superior SVC. Left IJ central line tip terminates over the mid SVC. Heart size normal. Nasogastric tube tip terminates below the level of the diaphragms but is not included in the field of view. The lungs are clear. No pleural effusion or pneumothorax.  IMPRESSION: No acute cardiopulmonary process.   Electronically Signed   By: Christiana Pellant M.D.   On: 01/28/2015 08:15   Dg Chest Port 1 View  01/27/2015   CLINICAL DATA:  Hypoxia/respiratory failure  EXAM: PORTABLE CHEST - 1 VIEW  COMPARISON:  January 26, 2015  FINDINGS: Endotracheal tube tip is 5.6 cm above the carina. Nasogastric tube tip and side port are in the stomach. There are two central catheters, each with tips in the superior vena cava. No pneumothorax. There is minimal left lower lobe atelectasis. Lungs elsewhere clear. Heart is upper normal in size with pulmonary vascularity within normal limits. No adenopathy.  IMPRESSION: Tube and catheter positions as described without pneumothorax. Minimal atelectasis on the left. Lungs otherwise clear.   Electronically Signed   By: Bretta Bang III M.D.   On: 01/27/2015 07:39   Dg Chest Port 1 View  01/26/2015   CLINICAL DATA:  Acute respiratory failure  EXAM: PORTABLE CHEST - 1 VIEW  COMPARISON:  Portable chest x-ray of January 25, 2015  FINDINGS: The left hemidiaphragm is excluded from the field of view. The lungs are well-expanded and clear. The cardiac silhouette is top-normal in size. The pulmonary vascularity is normal. No significant pleural effusion is demonstrated. The endotracheal tube, esophagogastric tube, and right  and left internal jugular venous catheters are unchanged.  IMPRESSION: There is no active cardiopulmonary disease. The visualized support tubes  and lines are in appropriate position radiographically.   Electronically Signed   By: David  [place]   On: 01/26/2015 07:40   Dg Chest Port 1 View  01/25/2015   CLINICAL DATA:  Acute respiratory failure  EXAM: PORTABLE CHEST - 1 VIEW  COMPARISON:  01/23/2015  FINDINGS: Endotracheal tube tip at the clavicular heads. Bilateral IJ catheters in stable position, tips at the upper SVC. The orogastric tube reaches the stomach at least.  Normal heart size and aortic contours. There is improved basilar aeration with probable trace effusions or atelectasis. No edema or air leak. No evidence for pneumonia.  IMPRESSION: 1. Tubes and lines remain in good position. 2. Continued clearing of basilar opacities.   Electronically Signed   By: Marnee Spring M.D.   On: 01/25/2015 07:04   Dg Chest Port 1 View  01/23/2015   CLINICAL DATA:  Acute respiratory failure  EXAM: PORTABLE CHEST - 1 VIEW  COMPARISON:  01/22/2015  FINDINGS: ET tube is 5 cm above the carina. Left jugular central line extends into the SVC. Right jugular central line extends into the SVC. Mild left base airspace opacity persists. There has been continued clearance of central airspace opacities, now off nearly completely resolved.  IMPRESSION: Support equipment appears satisfactorily positioned.  Continued improvement, with mild residual left base airspace opacity.   Electronically Signed   By: Ellery Plunk M.D.   On: 01/23/2015 04:50   Dg Chest Port 1 View  01/22/2015   CLINICAL DATA:  Central line placement  EXAM: PORTABLE CHEST - 1 VIEW  COMPARISON:  01/22/2015 at 15:15  FINDINGS: There is a new right jugular central line with tip in the SVC the azygos vein junction. The left jugular central line extends into the SVC. Endotracheal tube tip is 4 cm above the carina. Nasogastric tube extends into the stomach. Mild airspace opacities persist in the central and basilar regions with slight continuing improvement.  IMPRESSION: Support equipment appears  satisfactorily positioned.  New right jugular central line extends to the SVC at the azygos vein junction  No pneumothorax  Continued improvement in the airspace opacity   Electronically Signed   By: Ellery Plunk M.D.   On: 01/22/2015 22:49   Dg Chest Port 1 View  01/22/2015   CLINICAL DATA:  Acute respiratory failure.  EXAM: PORTABLE CHEST - 1 VIEW  COMPARISON:  01/21/2015  FINDINGS: Support devices are in stable position. Improving bilateral airspace opacities, likely improving edema. Heart size is borderline. No effusions. No acute bony abnormality.  IMPRESSION: Improving bilateral airspace opacities.   Electronically Signed   By: Charlett Nose M.D.   On: 01/22/2015 16:03   Dg Chest Portable 1 View  01/21/2015   CLINICAL DATA:  Status post central line placement  EXAM: PORTABLE CHEST - 1 VIEW  COMPARISON:  Film from earlier in the same day  FINDINGS: Endotracheal tube is now been placed in lies approximately 4.8 cm above the carina. A left jugular central line is noted in the proximal superior vena cava. A nasogastric catheter is noted within the stomach. Diffuse increased bilateral infiltrates are identified. No pneumothorax is identified. No bony abnormality is seen.  IMPRESSION: Increasing bilateral infiltrates.  Tubes and lines as described above.  No pneumothorax is noted.   Electronically Signed   By: Alcide Clever M.D.   On: 01/21/2015 15:07   Dg  Chest Port 1 View  01/21/2015   CLINICAL DATA:  Dyspnea, postprandial  EXAM: PORTABLE CHEST - 1 VIEW  COMPARISON:  01/18/2015  FINDINGS: There are mildly prominent left ventricular contours, unchanged. The lungs are clear. Minimal blunting of the lateral costophrenic angles may represent tiny effusions. Pulmonary vasculature is normal.  IMPRESSION: Possible tiny pleural effusions.   Electronically Signed   By: Ellery Plunk M.D.   On: 01/21/2015 03:32   Dg Chest Portable 1 View  01/18/2015   CLINICAL DATA:  Acute onset of shortness of breath and  diarrhea. Initial encounter.  EXAM: PORTABLE CHEST - 1 VIEW  COMPARISON:  Chest radiograph performed 11/22/2014  FINDINGS: The lungs are well-aerated and clear. There is no evidence of focal opacification, pleural effusion or pneumothorax.  The cardiomediastinal silhouette is within normal limits. No acute osseous abnormalities are seen.  IMPRESSION: No acute cardiopulmonary process seen.   Electronically Signed   By: Roanna Raider M.D.   On: 01/18/2015 19:13   Dg Abd Portable 1v  02/12/2015   CLINICAL DATA:  NGT placement.  EXAM: PORTABLE ABDOMEN - 1 VIEW  COMPARISON:  None.  FINDINGS: Tip of the weighted enteric tube is within the stomach. This is directed towards the pylorus. Mild gaseous gastric distention. Nonobstructive bowel gas pattern.  IMPRESSION: Tip of the weighted enteric tube within the stomach, directed towards the pylorus.   Electronically Signed   By: Rubye Oaks M.D.   On: 02/12/2015 21:49   Dg Abd Portable 1v  02/01/2015   CLINICAL DATA:  Feeding tube placement.  EXAM: PORTABLE ABDOMEN - 1 VIEW  COMPARISON:  Earlier this day at 1029 hr  FINDINGS: The enteric tube has been repositioned, the weighted tip now in the region of the body directed caudally. Unchanged bowel gas pattern with normal caliber air-filled transverse colon. No free intra-abdominal air is seen.  IMPRESSION: The enteric tube repositioned, now in the region of the gastric body, directed caudally.   Electronically Signed   By: Rubye Oaks M.D.   On: 02/01/2015 17:35   Dg Abd Portable 1v  02/01/2015   CLINICAL DATA:  Feeding catheter placement  EXAM: PORTABLE ABDOMEN - 1 VIEW  COMPARISON:  01/31/2015  FINDINGS: Feeding catheter is again noted within the stomach and kinked upon itself within the gastric lumen. The tip is noted adjacent to the gastroesophageal junction. The bowel gas pattern is stable. No free air is seen.  IMPRESSION: Feeding catheter as described.   Electronically Signed   By: Alcide Clever M.D.    On: 02/01/2015 10:48   Dg Abd Portable 1v  01/31/2015   CLINICAL DATA:  Feeding tube placement  EXAM: PORTABLE ABDOMEN - 1 VIEW  COMPARISON:  Plain film 01/31/2015  FINDINGS: Feeding tube with weighted tip in the gastric fundus. Interval removal of stylette. Left lower lobe atelectasis versus pneumonia.  IMPRESSION: Feeding tube with tip in the gastric fundus / proximal body.   Electronically Signed   By: Genevive Bi M.D.   On: 01/31/2015 20:58   Dg Abd Portable 1v  01/31/2015   CLINICAL DATA:  Assess feeding tube position following a attempts at repositioning  EXAM: PORTABLE ABDOMEN - 1 VIEW  COMPARISON:  Abdominal films of earlier today  FINDINGS: The feeding tube tip projects in the region of the proximal gastric fundus somewhat lower than on the previous study. A moderate amount of tubing is coiled within the stomach. There is persistent left lower lobe atelectasis or pneumonia.  IMPRESSION:  Slight interval improvement in the positioning of the feeding tube with the radiodense bulb lying in the region of the proximal gastric body. Advancement is needed prior to feeding tube use.   Electronically Signed   By: David  [place]   On: 01/31/2015 16:52   Dg Abd Portable 1v  01/31/2015   CLINICAL DATA:  Check feeding tube positioning.  EXAM: PORTABLE ABDOMEN - 1 VIEW  COMPARISON:  01/21/2015  FINDINGS: The feeding tube is coiled in the stomach, with tip in the distal esophagus.  Rectal tube present.  No bowel obstruction.  New opacification of the retrocardiac lung.  These results were called by telephone at the time of interpretation on 01/31/2015 at 12:35 pm to floor RN, who verbally acknowledged these results.  IMPRESSION: 1. The feeding tube coils through the stomach with tip re- entering the distal esophagus. The tube needs repositioning or exchange before use. 2. Retrocardiac opacification which is new 01/21/2015.   Electronically Signed   By: Marnee Spring M.D.   On: 01/31/2015 12:36   Dg Abd  Portable 1v  01/21/2015   CLINICAL DATA:  Sudden onset abdominal pain with dyspnea, postprandial  EXAM: PORTABLE ABDOMEN - 1 VIEW  COMPARISON:  CT 11/19/2014  FINDINGS: The bowel gas pattern is normal. No radio-opaque calculi or other significant radiographic abnormality are seen.  IMPRESSION: Negative.   Electronically Signed   By: Ellery Plunk M.D.   On: 01/21/2015 03:26   Dg Fluoro Guide Cv Line-no Report  02/05/2015   CLINICAL DATA:    FLOURO GUIDE CV LINE  Fluoroscopy was utilized by the requesting physician.  No radiographic  interpretation.     Microbiology: Recent Results (from the past 240 hour(s))  Surgical pcr screen     Status: None   Collection Time: 02/14/15 12:21 AM  Result Value Ref Range Status   MRSA, PCR NEGATIVE NEGATIVE Final   Staphylococcus aureus NEGATIVE NEGATIVE Final    Comment:        The Xpert SA Assay (FDA approved for NASAL specimens in patients over 52 years of age), is one component of a comprehensive surveillance program.  Test performance has been validated by [facility] for patients greater than or equal to 92 year old. It is not intended to diagnose infection nor to guide or monitor treatment.      Labs: Basic Metabolic Panel:  Recent Labs Lab 02/11/15 0912 02/12/15 0430 02/13/15 1600 02/14/15 0525 02/15/15 0705  NA 134* 134* 137 137  138 137  K 3.7 4.0 4.2 4.0  3.9 3.8  CL 98 94* 100 99  99 96  CO2 23 29 29 30  30 28   GLUCOSE 151* 126* 126* 89  89 111*  BUN 18 33* 12 24*  24* 32*  CREATININE 2.91* 4.08* 1.90* 3.07*  3.08* 4.59*  CALCIUM 9.8 9.9 9.1 9.6  9.6 9.5  PHOS 3.1 3.9 2.2* 4.2 5.4*   Liver Function Tests:  Recent Labs Lab 02/11/15 0912 02/12/15 0430 02/13/15 1600 02/14/15 0525 02/15/15 0705  ALBUMIN 2.5* 2.5* 2.5* 2.6* 2.4*   No results for input(s): LIPASE, AMYLASE in the last 168 hours. No results for input(s): AMMONIA in the last 168 hours. CBC:  Recent Labs Lab 02/13/15 0500 02/14/15 0525  02/15/15 0705  WBC 6.3 5.5 5.6  HGB 7.8* 7.8* 7.8*  HCT 24.6* 26.2* 25.6*  MCV 88.8 92.9 93.4  PLT 283 266 269   Cardiac Enzymes: No results for input(s): CKTOTAL, CKMB, CKMBINDEX, TROPONINI in the  last 168 hours. BNP: BNP (last 3 results) No results for input(s): BNP in the last 8760 hours.  ProBNP (last 3 results)  Recent Labs  10/05/14 0521  PROBNP 20673.0*    CBG:  Recent Labs Lab 02/14/15 1618 02/14/15 2021 02/15/15 0009 02/15/15 0428 02/15/15 1014  GLUCAP 104* 104* 113* 108* 110*       Signed:  Trexton Escamilla A  [facility] 02/15/2015, 11:13 AM

## 2015-02-15 NOTE — Progress Notes (Signed)
I have insurance approval and plan to admit pt to inpt rehab today. I have discussed with Dr. Bjorn Pippin and Dr. Tyrell Antonio. I will notify RN CM and SW. [phone]

## 2015-02-15 NOTE — H&P (Signed)
Physical Medicine and Rehabilitation Admission H&P   Chief Complaint  Patient presents with  . Shortness of Breath  . Diarrhea  : HPI: [name] [name] is a 54 y.o. right handed female with history of remote right inferior brainstem hemorrhage and received inpatient rehabilitation services November 2015, hypertension, diabetes mellitus or peripheral neuropathy, chronic renal insufficiency with baseline creatinine 2.97, lupus. Independent with a cane prior to admission living with her-2 adult sons. Presented 01/18/2015 with vague complaints of shortness of breath, diarrhea, [place] and cough. Troponin mildly elevated 0.57. Chest x-ray showed no acute pulmonary process. Patient developed intermittent SVT with persistent tachycardia heart rate 150s became hypoxic requiring NRB and vent support and ultimately extubated 02/05/2015. Cranial CT scan negative. MRI of the brain again showed remote right inferior brainstem hemorrhage without acute findings.. Venous Doppler study negative. Echocardiogram with ejection fraction of 50% severe hypokinesis. EEG with no seizure activity but did show diffuse disturbance etiologically nonspecific suspect metabolic encephalopathy. Acute on chronic renal failure follow-up renal services hemodialysis initiated and underwent left basilic vein transposition fistula 02/14/2015 per Dr.Early .Marland Kitchen Currently nothing by mouth with nasogastric tube feeds. Subcutaneous heparin for DVT prophylaxis.WOC consulted 02/08/2015 for a stage II wound deep tissue injury to her nare/lip with a routine skin care. Patient with some loose stools C. difficile specimen pending currently on precautions. Physical and occupational therapy evaluation completed 02/06/2015 with recommendations of physical medicine rehabilitation consult. Patient was admitted for comprehensive rehabilitation program  Patient remembers me from clinic however did not remember seeing me during physical medicine and  rehabilitation consultation  Review of Systems  Cardiovascular: Positive for leg swelling.  Gastrointestinal: Positive for constipation.  Musculoskeletal: Positive for myalgias and joint pain.  Endo/Heme/Allergies: Positive for environmental allergies.  All other systems reviewed and are negative.      Past Medical History  Diagnosis Date  . Hypertension   . Lupus   . CKD (chronic kidney disease)     due to lupus/Dr. [name]  . Diabetes mellitus   . Stenosis of cervical spine region     with HNP at C5/6, C6/7  . Metatarsal bone fracture right    4th  . 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 Arthritis     RA  . Anemia   . Stroke 11/2014    left sided weakness, dysphagia  . Pain in joints   . Paresthesias   . Hyperlipidemia   . Colitis     ? per hospital notes 11/2014  . Lower GI bleed   . Hyponatremia   . Metabolic acidosis   . Hyperplastic colon polyp   . Hemorrhoids   . Blood transfusion without reported diagnosis    Past Surgical History  Procedure Laterality Date  . Breast biopsy    . Tubal ligation    . Insertion of dialysis catheter Right 02/05/2015    Procedure: INSERTION OF DIALYSIS CATHETER - RIGHT INTERNAL JUGULAR; Surgeon: Conrad Palmer, MD; Location: [facility]; Service: Vascular; Laterality: Right;   Family History  Problem Relation Age of Onset  . Hypertension    . Lupus    . Rheum arthritis    . Hypertension Mother   . Diabetes Mother   . Hypertension Sister   . Diabetes Father   . Hypertension Maternal Grandmother    Social History:  reports that she has quit smoking. Her smoking use included Cigarettes. She has a 15  pack-year smoking history. She has never used smokeless tobacco. She reports that she does not drink alcohol or use illicit drugs. Allergies:   Allergies  Allergen Reactions  . Sulfa Antibiotics Itching   Medications Prior to Admission  Medication Sig Dispense Refill  . acetaminophen (TYLENOL) 325 MG tablet Take 2 tablets (650 mg total) by mouth every 4 (four) hours as needed for mild pain.    Marland Kitchen ALPRAZolam (XANAX) 0.5 MG tablet Take 1 tablet (0.5 mg total) by mouth 2 (two) times daily as needed for anxiety. 15 tablet 0  . gabapentin (NEURONTIN) 100 MG capsule Take 1 capsule (100 mg total) by mouth at bedtime. (Patient taking differently: Take 100 mg by mouth 3 (three) times daily. ) 30 capsule 0  . labetalol (NORMODYNE) 300 MG tablet Take 2 tablets (600 mg total) by mouth 2 (two) times daily. 120 tablet 0  . mycophenolate (CELLCEPT) 250 MG capsule Take 4 capsules (1,000 mg total) by mouth 2 (two) times daily. 320 capsule 1  . senna-docusate (SENOKOT-S) 8.6-50 MG per tablet Take 1 tablet by mouth 2 (two) times daily. (Patient taking differently: Take 1 tablet by mouth 2 (two) times daily as needed. ) 60 tablet 1  . sodium bicarbonate 650 MG tablet Take 2 tablets (1,300 mg total) by mouth 2 (two) times daily. 120 tablet 1  . Vitamin D, Ergocalciferol, (DRISDOL) 50000 UNITS CAPS capsule Take 1 capsule (50,000 Units total) by mouth every Wednesday. 4 capsule 1  . atorvastatin (LIPITOR) 20 MG tablet Take 1 tablet (20 mg total) by mouth daily at 6 PM. 30 tablet 1  . cloNIDine (CATAPRES) 0.3 MG tablet Take 1 tablet (0.3 mg total) by mouth 3 (three) times daily. 90 tablet 1  . clotrimazole (MYCELEX) 10 MG troche Take 1 tablet (10 mg total) by mouth 5 (five) times daily. 30 tablet 0  . cyclobenzaprine (FLEXERIL) 5 MG tablet Take 5 mg by mouth 3 (three) times daily as needed for muscle spasms.     . diclofenac sodium (VOLTAREN) 1 % GEL Apply 2 g topically 4 (four) times daily. To forefoot 4 Tube 0  . ferrous sulfate 325 (65 FE) MG tablet Take 1 tablet (325 mg total) by  mouth daily with breakfast. 30 tablet 1  . furosemide (LASIX) 80 MG tablet Take 80 mg by mouth 2 (two) times daily.  0  . hydrALAZINE (APRESOLINE) 25 MG tablet Take 25 mg by mouth every 8 (eight) hours.  0  . hydroxychloroquine (PLAQUENIL) 200 MG tablet Take 2 tablets (400 mg total) by mouth daily. 30 tablet 1  . lidocaine (XYLOCAINE) 2 % solution     . Menthol-Methyl Salicylate (MUSCLE RUB) 10-15 % CREA Apply 1 application topically 3 (three) times daily before meals. 85 g 0  . multivitamin (RENA-VIT) TABS tablet Take 1 tablet by mouth daily.    Marland Kitchen nystatin-triamcinolone (MYCOLOG II) cream Apply topically 2 (two) times daily.  0  . nystatin-triamcinolone ointment (MYCOLOG) Apply 1 application topically 2 (two) times daily. 30 g 0  . pantoprazole (PROTONIX) 40 MG tablet Take 1 tablet (40 mg total) by mouth daily. 30 tablet 11  . pramoxine-hydrocortisone (PROCTOCREAM-HC) 1-1 % rectal cream Place 1 application rectally 2 (two) times daily. (Patient taking differently: Place 1 application rectally 2 (two) times daily as needed for hemorrhoids or itching. ) 30 g 1    Home: Home Living Family/patient expects to be discharged to:: Private residence Living Arrangements: Children Available Help at Discharge: Available 24 hours/day, Family  Type of Home: Apartment Home Access: Stairs to enter [employer] of Steps: 4 Entrance Stairs-Rails: Right Home Layout: Two level, Able to live on main level with bedroom/bathroom Alternate Level Stairs-Number of Steps: flight Home Equipment: Environmental consultant - 2 wheels, Cane - single point, Shower seat, Bedside commode Additional Comments: Pt was previously receiving HHOT and PT  Functional History: Prior Function Level of Independence: Independent with assistive device(s) Comments: Pt reports on her bad days she uses the cane or the RW.   Functional Status:  Mobility: Bed Mobility Overal bed  mobility: Needs Assistance Bed Mobility: Supine to Sit, Sit to Supine Supine to sit: Supervision Sit to supine: Supervision General bed mobility comments: no physical assist, use of rail Transfers Overall transfer level: Needs assistance Equipment used: Rolling walker (2 wheeled) Transfers: Sit to/from Stand Sit to Stand: Min guard General transfer comment: min guard for safety, good technique Ambulation/Gait Ambulation/Gait assistance: Min guard Ambulation Distance (Feet): 100 Feet Assistive device: Rolling walker (2 wheeled) Gait Pattern/deviations: Step-through pattern, Decreased stance time - left, Decreased step length - right Gait velocity: Decreased Gait velocity interpretation: Below normal speed for age/gender General Gait Details: Pt used 2wRW well with minimal verbal cueing and appeared to bear weight evenly between UE's and LE's. No LOB during gait, but stayed within center of mass throughout.    ADL: ADL Overall ADL's : Needs assistance/impaired Eating/Feeding: NPO Grooming: Wash/dry hands, Standing, Supervision/safety Upper Body Bathing: Set up, Supervision/ safety, Sitting Lower Body Bathing: Minimal assistance (with Mod A sit<>stand) Upper Body Dressing : Set up, Standing Lower Body Dressing: Minimal assistance (with Mod A sit<>stand) Toilet Transfer: Min guard, RW, Ambulation, BSC (over toilet) Toileting- Clothing Manipulation and Hygiene: Moderate assistance, Sit to/from stand Toileting - Clothing Manipulation Details (indicate cue type and reason): assist to clean up after BM Functional mobility during ADLs: Min guard, Rolling walker General ADL Comments: Patient completed UB/LB bathing in sit<>stand position from recliner. Patient required min verbal cues for safety with use of RW and therapist reiterated importance of using RW during all transfers for safety at this time. Educated patient and NT on importance of patient ambulating <> BR for toilet transfers and  toielting needs to help patient regain her strength, endurance, and independence.   Cognition: Cognition Overall Cognitive Status: Within Functional Limits for tasks assessed Orientation Level: Oriented X4 Cognition Arousal/Alertness: Awake/alert Behavior During Therapy: WFL for tasks assessed/performed Overall Cognitive Status: Within Functional Limits for tasks assessed  Physical Exam: Blood pressure 133/71, pulse 85, temperature 98.6 F (37 C), temperature source Oral, resp. rate 16, height 5' 5" (1.651 m), weight 54.1 kg (119 lb 4.3 oz), last menstrual period 07/18/2012, SpO2 98 %. Physical Exam  Vitals reviewed. Constitutional: She is oriented to person, place, and time. She appears well-developed.  HENT:  Head: Normocephalic.  Eyes: EOM are normal.  Neck: Normal range of motion. Neck supple. No thyromegaly present.  Cardiovascular: Normal rate and regular rhythm.  Respiratory: Effort normal and breath sounds normal. No respiratory distress.  GI: Soft. Bowel sounds are normal. She exhibits no distension.  Neurological: She is alert and oriented to person, place, and time.  Follows commands  Skin:  Fistula site clean and dry   4 / 5 bilateral deltoid bicep tricep and grip 4/5 right hip flexors, knee extension, ADLs, 3+ hip flexors, ADLs   Lab Results Last 48 Hours    Results for orders placed or performed during the hospital encounter of 01/18/15 (from the past 48 hour(s))  Glucose,  capillary Status: Abnormal   Collection Time: 02/12/15 11:51 AM  Result Value Ref Range   Glucose-Capillary 121 (H) 70 - 99 mg/dL  Glucose, capillary Status: Abnormal   Collection Time: 02/12/15 4:19 PM  Result Value Ref Range   Glucose-Capillary 127 (H) 70 - 99 mg/dL  Glucose, capillary Status: Abnormal   Collection Time: 02/12/15 8:14 PM  Result Value Ref Range   Glucose-Capillary 101 (H) 70 - 99 mg/dL  Glucose, capillary Status: None    Collection Time: 02/12/15 11:52 PM  Result Value Ref Range   Glucose-Capillary 97 70 - 99 mg/dL  Glucose, capillary Status: Abnormal   Collection Time: 02/13/15 4:20 AM  Result Value Ref Range   Glucose-Capillary 102 (H) 70 - 99 mg/dL  CBC Status: Abnormal   Collection Time: 02/13/15 5:00 AM  Result Value Ref Range   WBC 6.3 4.0 - 10.5 K/uL   RBC 2.77 (L) 3.87 - 5.11 MIL/uL   Hemoglobin 7.8 (L) 12.0 - 15.0 g/dL   HCT 24.6 (L) 36.0 - 46.0 %   MCV 88.8 78.0 - 100.0 fL   MCH 28.2 26.0 - 34.0 pg   MCHC 31.7 30.0 - 36.0 g/dL   RDW 18.5 (H) 11.5 - 15.5 %   Platelets 283 150 - 400 K/uL  Glucose, capillary Status: Abnormal   Collection Time: 02/13/15 7:30 AM  Result Value Ref Range   Glucose-Capillary 120 (H) 70 - 99 mg/dL  Glucose, capillary Status: None   Collection Time: 02/13/15 1:44 PM  Result Value Ref Range   Glucose-Capillary 98 70 - 99 mg/dL  Renal function panel Status: Abnormal   Collection Time: 02/13/15 4:00 PM  Result Value Ref Range   Sodium 137 135 - 145 mmol/L   Potassium 4.2 3.5 - 5.1 mmol/L   Chloride 100 96 - 112 mmol/L   CO2 29 19 - 32 mmol/L   Glucose, Bld 126 (H) 70 - 99 mg/dL   BUN 12 6 - 23 mg/dL   Creatinine, Ser 1.90 (H) 0.50 - 1.10 mg/dL   Calcium 9.1 8.4 - 10.5 mg/dL   Phosphorus 2.2 (L) 2.3 - 4.6 mg/dL   Albumin 2.5 (L) 3.5 - 5.2 g/dL   GFR calc non Af Amer 29 (L) >90 mL/min   GFR calc Af Amer 34 (L) >90 mL/min    Comment: (NOTE) The eGFR has been calculated using the CKD EPI equation. This calculation has not been validated in all clinical situations. eGFR's persistently <90 mL/min signify possible Chronic Kidney Disease.    Anion gap 8 5 - 15  Glucose, capillary Status: Abnormal   Collection Time: 02/13/15 4:02 PM  Result Value Ref Range   Glucose-Capillary 126  (H) 70 - 99 mg/dL  Glucose, capillary Status: Abnormal   Collection Time: 02/13/15 8:38 PM  Result Value Ref Range   Glucose-Capillary 100 (H) 70 - 99 mg/dL  Glucose, capillary Status: Abnormal   Collection Time: 02/14/15 12:05 AM  Result Value Ref Range   Glucose-Capillary 100 (H) 70 - 99 mg/dL  Surgical pcr screen Status: None   Collection Time: 02/14/15 12:21 AM  Result Value Ref Range   MRSA, PCR NEGATIVE NEGATIVE   Staphylococcus aureus NEGATIVE NEGATIVE    Comment:   The Xpert SA Assay (FDA approved for NASAL specimens in patients over 55 years of age), is one component of a comprehensive surveillance program. Test performance has been validated by [facility] for patients greater than or equal to 57 year old. It is not intended  to diagnose infection nor to guide or monitor treatment.   Glucose, capillary Status: None   Collection Time: 02/14/15 4:42 AM  Result Value Ref Range   Glucose-Capillary 87 70 - 99 mg/dL  Renal function panel (daily at 0500) Status: Abnormal   Collection Time: 02/14/15 5:25 AM  Result Value Ref Range   Sodium 138 135 - 145 mmol/L   Potassium 3.9 3.5 - 5.1 mmol/L   Chloride 99 96 - 112 mmol/L   CO2 30 19 - 32 mmol/L   Glucose, Bld 89 70 - 99 mg/dL   BUN 24 (H) 6 - 23 mg/dL    Comment: DELTA CHECK NOTED   Creatinine, Ser 3.08 (H) 0.50 - 1.10 mg/dL    Comment: DELTA CHECK NOTED   Calcium 9.6 8.4 - 10.5 mg/dL   Phosphorus 4.2 2.3 - 4.6 mg/dL   Albumin 2.6 (L) 3.5 - 5.2 g/dL   GFR calc non Af Amer 16 (L) >90 mL/min   GFR calc Af Amer 19 (L) >90 mL/min    Comment: (NOTE) The eGFR has been calculated using the CKD EPI equation. This calculation has not been validated in all clinical situations. eGFR's persistently <90 mL/min signify possible Chronic Kidney Disease.    Anion gap 9 5 - 15   CBC Status: Abnormal   Collection Time: 02/14/15 5:25 AM  Result Value Ref Range   WBC 5.5 4.0 - 10.5 K/uL   RBC 2.82 (L) 3.87 - 5.11 MIL/uL   Hemoglobin 7.8 (L) 12.0 - 15.0 g/dL   HCT 26.2 (L) 36.0 - 46.0 %   MCV 92.9 78.0 - 100.0 fL   MCH 27.7 26.0 - 34.0 pg   MCHC 29.8 (L) 30.0 - 36.0 g/dL   RDW 18.8 (H) 11.5 - 15.5 %   Platelets 266 150 - 400 K/uL  Basic metabolic panel Status: Abnormal   Collection Time: 02/14/15 5:25 AM  Result Value Ref Range   Sodium 137 135 - 145 mmol/L   Potassium 4.0 3.5 - 5.1 mmol/L   Chloride 99 96 - 112 mmol/L   CO2 30 19 - 32 mmol/L   Glucose, Bld 89 70 - 99 mg/dL   BUN 24 (H) 6 - 23 mg/dL   Creatinine, Ser 3.07 (H) 0.50 - 1.10 mg/dL   Calcium 9.6 8.4 - 10.5 mg/dL   GFR calc non Af Amer 16 (L) >90 mL/min   GFR calc Af Amer 19 (L) >90 mL/min    Comment: (NOTE) The eGFR has been calculated using the CKD EPI equation. This calculation has not been validated in all clinical situations. eGFR's persistently <90 mL/min signify possible Chronic Kidney Disease.    Anion gap 8 5 - 15  Glucose, capillary Status: None   Collection Time: 02/14/15 8:03 AM  Result Value Ref Range   Glucose-Capillary 76 70 - 99 mg/dL   Comment 1 Notify RN       Imaging Results (Last 48 hours)    Dg Abd Portable 1v  02/12/2015 CLINICAL DATA: NGT placement. EXAM: PORTABLE ABDOMEN - 1 VIEW COMPARISON: None. FINDINGS: Tip of the weighted enteric tube is within the stomach. This is directed towards the pylorus. Mild gaseous gastric distention. Nonobstructive bowel gas pattern. IMPRESSION: Tip of the weighted enteric tube within the stomach, directed towards the pylorus. Electronically Signed By: Jeb Levering M.D. On: 02/12/2015 21:49        Medical Problem List and Plan: 1. Functional deficits secondary to deconditioning secondary  to respiratory failure/history brainstem infarct right  paramedian pontine 2. DVT Prophylaxis/Anticoagulation: Subcutaneous heparin. Monitor platelet counts and any signs of bleeding. Venous Doppler is negative 3. Pain Management: Tylenol as needed 4. Dysphagia. Nasogastric tube feeds. Follow-up speech therapy 5. Neuropsych: This patient is capable of making decisions on her own behalf. 6. Skin/Wound Care: Skin care as directed 7. Fluids/Electrolytes/Nutrition: Strict I&O's with follow-up chemistries 8. End-stage renal disease/SLE. Continue dialysis as per renal services 9.Hypertension/SVT. Labetalol 250 mg 3 times a day. Monitor with increased mobility 10. Acute on Chronic anemia. Follow up cbc.Aranesp weekly 11.Lupus.Cellcept/Plaquenil 12. C. difficile pending. Patient currently on contact precautions. Follow-up stool specimen  Post Admission Physician Evaluation: 1. Functional deficits secondary to Deconditioning secondary to respiratory failure as well as acute on chronic renal failure in a patient with prior pontine infarct. 2. Patient is admitted to receive collaborative, interdisciplinary care between the physiatrist, rehab nursing staff, and therapy team. 3. Patient's level of medical complexity and substantial therapy needs in context of that medical necessity cannot be provided at a lesser intensity of care such as a SNF. 4. Patient has experienced substantial functional loss from his/her baseline which was documented above under the "Functional History" and "Functional Status" headings. Judging by the patient's diagnosis, physical exam, and functional history, the patient has potential for functional progress which will result in measurable gains while on inpatient rehab. These gains will be of substantial and practical use upon discharge in facilitating mobility and self-care at the household level. 5. Physiatrist will provide 24 hour management of medical needs as well as oversight  of the therapy plan/treatment and provide guidance as appropriate regarding the interaction of the two. 6. 24 hour rehab nursing will assist with bladder management, bowel management, safety, skin/wound care, disease management, medication administration, pain management and patient education and help integrate therapy concepts, techniques,education, etc. 7. PT will assess and treat for/with: pre gait, gait training, endurance , safety, equipment, neuromuscular re education. Goals are: Supervision mobility. 8. OT will assess and treat for/with: ADLs, Cognitive perceptual skills, Neuromuscular re education, safety, endurance, equipment. Goals are: Supervision and ADLs. Therapy May proceed with showering this patient. 9. SLP will assess and treat for/with: Dysphagia. Goals are: Safe and adequate by mouth intake of solids and liquids. 10. Case Management and Social Worker will assess and treat for psychological issues and discharge planning. 11. Team conference will be held weekly to assess progress toward goals and to determine barriers to discharge. 12. Patient will receive at least 3 hours of therapy per day at least 5 days per week. 13. ELOS: 12-16 days  14. Prognosis: good     [name] M.D. [facility] FAAPM&R (Sports Med, Neuromuscular Med) Diplomate [place] Board of Electrodiagnostic Med  02/14/2015

## 2015-02-15 NOTE — Progress Notes (Signed)
  [facility] Progress Note  02/15/2015 7:45 AM 1 Day Post-Op  Subjective:  Patient seen in HD. Has intermittent tingling in left hand. Denies any pain.   Filed Vitals:   02/15/15 0730  BP: 128/63  Pulse: 83  Temp:   Resp:     Physical Exam: Left AVF with palpable thrill. Left antecubital incision clean and intact. Steri-strips in place. No hematoma. Left hand grip 5/5, sensation in digits intact. Palpable left radial pulse.   CBC    Component Value Date/Time   WBC 5.6 02/15/2015 0705   WBC 5.5 03/01/2012 1128   RBC 2.74* 02/15/2015 0705   RBC 3.56* 03/01/2012 1128   HGB 7.8* 02/15/2015 0705   HGB 9.0* 03/01/2012 1128   HCT 25.6* 02/15/2015 0705   HCT 29.3* 03/01/2012 1128   PLT 269 02/15/2015 0705   MCV 93.4 02/15/2015 0705   MCV 82.4 03/01/2012 1128   MCH 28.5 02/15/2015 0705   MCH 25.3* 03/01/2012 1128   MCHC 30.5 02/15/2015 0705   MCHC 30.7* 03/01/2012 1128   RDW 18.5* 02/15/2015 0705   LYMPHSABS 0.6* 11/18/2014 2324   MONOABS 0.4 11/18/2014 2324   EOSABS 0.0 11/18/2014 2324   BASOSABS 0.0 11/18/2014 2324    BMET    Component Value Date/Time   NA 138 02/14/2015 0525   NA 137 02/14/2015 0525   K 3.9 02/14/2015 0525   K 4.0 02/14/2015 0525   CL 99 02/14/2015 0525   CL 99 02/14/2015 0525   CO2 30 02/14/2015 0525   CO2 30 02/14/2015 0525   GLUCOSE 89 02/14/2015 0525   GLUCOSE 89 02/14/2015 0525   BUN 24* 02/14/2015 0525   BUN 24* 02/14/2015 0525   CREATININE 3.08* 02/14/2015 0525   CREATININE 3.07* 02/14/2015 0525   CREATININE 3.49* 11/10/2014 1822   CREATININE 0.35* 10/23/2008 2300   CALCIUM 9.6 02/14/2015 0525   CALCIUM 9.6 02/14/2015 0525   CALCIUM 8.9 10/10/2014 1931   GFRNONAA 16* 02/14/2015 0525   GFRNONAA 16* 02/14/2015 0525   GFRAA 19* 02/14/2015 0525   GFRAA 19* 02/14/2015 0525    INR    Component Value Date/Time   INR 1.29 02/02/2015 0245     Intake/Output Summary (Last 24 hours) at 02/15/15 0745 Last data  filed at 02/15/15 0600  Gross per 24 hour  Intake    150 ml  Output      7 ml  Net    143 ml     Assessment:  54 y.o. female is s/p: left 1st stage basilic vein transposition 1 Day Post-Op  Plan: -Fistula is patent. -No steal symptoms. -Incision is clean and intact.  -Will sign off. Patient will follow up with Dr. Donnetta Hutching in 4 weeks with duplex study. Will be available as needed.    Virgina Jock, PA-C [facility] Office: [phone] Pager: [phone] 02/15/2015 7:45 AM

## 2015-02-15 NOTE — Telephone Encounter (Signed)
-----   Message from Mena Goes, RN sent at 02/14/2015 12:26 PM EDT ----- Regarding: Schedule   ----- Message -----    From: Alvia Grove, PA-C    Sent: 02/14/2015  12:14 PM      To: [facility] Charge Pool  S/p Left 1st stage BVT 02/14/15  F/u with Dr. Donnetta Hutching in 4 weeks with duplex  Thanks Maudie Mercury

## 2015-02-15 NOTE — Progress Notes (Signed)
Physical Medicine and Rehabilitation Consult Reason for Consult: Debilitation/respiratory failure/acute encephalopathy Referring Physician: [facility]   HPI: [name] [name] is a 54 y.o. right handed female with history of remote right inferior brainstem hemorrhage, hypertension, diabetes mellitus or peripheral neuropathy, chronic renal insufficiency with baseline creatinine 2.97, lupus. Independent with a cane prior to admission living with her-2 adult sons. Presented 01/18/2015 with vague complaints of shortness of breath, diarrhea, [place] and cough. Troponin mildly elevated 0.57. Chest x-ray showed no acute pulmonary process. Patient developed intermittent SVT with persistent tachycardia heart rate 150s became hypoxic requiring NRB and vent support. Cranial CT scan negative. MRI of the brain again showed remote right inferior brainstem hemorrhage without acute findings.. Venous Doppler study negative. Echocardiogram with ejection fraction of 50% severe hypokinesis. EEG with no seizure activity but did show diffuse disturbance etiologically nonspecific suspect metabolic encephalopathy. Acute on chronic renal failure follow-up renal services hemodialysis initiated with plan for AVF when more stable. Patient was extubated 01/05/2015. Currently nothing by mouth with nasogastric tube feeds. Subcutaneous heparin for DVT prophylaxis. Physical therapy evaluation completed 02/06/2015 with recommendations of physical medicine rehabilitation consult.  Per Mom, pt used cane to amb at home. 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 Hypertension   . Lupus   . CKD (chronic kidney disease)     due to lupus/Dr. [name]  . Diabetes mellitus   . Stenosis of cervical spine region     with HNP at  C5/6, C6/7  . Metatarsal bone fracture right    4th  . 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 Arthritis     RA  . Anemia   . Stroke 11/2014    left sided weakness, dysphagia  . Pain in joints   . Paresthesias   . Hyperlipidemia   . Colitis     ? per hospital notes 11/2014  . Lower GI bleed   . Hyponatremia   . Metabolic acidosis   . Hyperplastic colon polyp   . Hemorrhoids   . Blood transfusion without reported diagnosis    Past Surgical History  Procedure Laterality Date  . Breast biopsy    . Tubal ligation    . Insertion of dialysis catheter Right 02/05/2015    Procedure: INSERTION OF DIALYSIS CATHETER - RIGHT INTERNAL JUGULAR; Surgeon: Conrad Jasper, MD; Location: [facility]; Service: Vascular; Laterality: Right;   Family History  Problem Relation Age of Onset  . Hypertension    . Lupus    . Rheum arthritis    . Hypertension Mother   . Diabetes Mother   . Hypertension Sister   . Diabetes Father   . Hypertension Maternal Grandmother    Social History:  reports that she has quit smoking. Her smoking use included Cigarettes. She has a 15 pack-year smoking history. She has never used smokeless tobacco. She reports that she does not drink alcohol or use illicit drugs. Allergies:  Allergies  Allergen Reactions  . Sulfa Antibiotics Itching   Medications Prior to Admission  Medication Sig Dispense Refill  . acetaminophen (TYLENOL) 325 MG tablet Take 2 tablets (650 mg total) by mouth every 4 (four) hours as needed for mild pain.    Marland Kitchen  ALPRAZolam (XANAX) 0.5 MG tablet Take 1 tablet (0.5 mg total) by mouth 2 (two) times daily as needed for anxiety. 15 tablet 0  . gabapentin (NEURONTIN) 100 MG capsule Take 1 capsule (100 mg total) by mouth at bedtime. (Patient taking differently: Take 100 mg  by mouth 3 (three) times daily. ) 30 capsule 0  . labetalol (NORMODYNE) 300 MG tablet Take 2 tablets (600 mg total) by mouth 2 (two) times daily. 120 tablet 0  . mycophenolate (CELLCEPT) 250 MG capsule Take 4 capsules (1,000 mg total) by mouth 2 (two) times daily. 320 capsule 1  . senna-docusate (SENOKOT-S) 8.6-50 MG per tablet Take 1 tablet by mouth 2 (two) times daily. (Patient taking differently: Take 1 tablet by mouth 2 (two) times daily as needed. ) 60 tablet 1  . sodium bicarbonate 650 MG tablet Take 2 tablets (1,300 mg total) by mouth 2 (two) times daily. 120 tablet 1  . Vitamin D, Ergocalciferol, (DRISDOL) 50000 UNITS CAPS capsule Take 1 capsule (50,000 Units total) by mouth every Wednesday. 4 capsule 1  . atorvastatin (LIPITOR) 20 MG tablet Take 1 tablet (20 mg total) by mouth daily at 6 PM. 30 tablet 1  . cloNIDine (CATAPRES) 0.3 MG tablet Take 1 tablet (0.3 mg total) by mouth 3 (three) times daily. 90 tablet 1  . clotrimazole (MYCELEX) 10 MG troche Take 1 tablet (10 mg total) by mouth 5 (five) times daily. 30 tablet 0  . cyclobenzaprine (FLEXERIL) 5 MG tablet Take 5 mg by mouth 3 (three) times daily as needed for muscle spasms.     . diclofenac sodium (VOLTAREN) 1 % GEL Apply 2 g topically 4 (four) times daily. To forefoot 4 Tube 0  . ferrous sulfate 325 (65 FE) MG tablet Take 1 tablet (325 mg total) by mouth daily with breakfast. 30 tablet 1  . furosemide (LASIX) 80 MG tablet Take 80 mg by mouth 2 (two) times daily.  0  . hydrALAZINE (APRESOLINE) 25 MG tablet Take 25 mg by mouth every 8 (eight) hours.  0  . hydroxychloroquine (PLAQUENIL) 200 MG tablet Take 2 tablets (400 mg total) by mouth daily. 30 tablet 1  . lidocaine (XYLOCAINE) 2 % solution     . Menthol-Methyl Salicylate (MUSCLE RUB) 10-15 % CREA Apply 1 application topically 3 (three) times daily before meals. 85 g 0  . multivitamin (RENA-VIT) TABS  tablet Take 1 tablet by mouth daily.    Marland Kitchen nystatin-triamcinolone (MYCOLOG II) cream Apply topically 2 (two) times daily.  0  . nystatin-triamcinolone ointment (MYCOLOG) Apply 1 application topically 2 (two) times daily. 30 g 0  . pantoprazole (PROTONIX) 40 MG tablet Take 1 tablet (40 mg total) by mouth daily. 30 tablet 11  . pramoxine-hydrocortisone (PROCTOCREAM-HC) 1-1 % rectal cream Place 1 application rectally 2 (two) times daily. (Patient taking differently: Place 1 application rectally 2 (two) times daily as needed for hemorrhoids or itching. ) 30 g 1    Home: Home Living Family/patient expects to be discharged to:: Private residence Living Arrangements: Children Available Help at Discharge: Available 24 hours/day, Family Type of Home: Apartment Home Access: Stairs to enter Technical brewer of Steps: 4 Entrance Stairs-Rails: Right Home Layout: Two level, Able to live on main level with bedroom/bathroom Alternate Level Stairs-Number of Steps: flight Home Equipment: Environmental consultant - 2 wheels, [employer] - single point, Shower seat, Bedside commode Additional Comments: Pt was previously receiving HHOT and PT  Functional History: Prior Function Level of Independence: Independent with assistive device(s) Comments:  Pt reports on her bad days she uses the cane or the RW.  Functional Status:  Mobility: Bed Mobility Overal bed mobility: Needs Assistance, +2 for physical assistance Bed Mobility: Supine to Sit, Sit to Supine Supine to sit: Mod assist, +2 for physical assistance Sit to supine: Mod assist, +2 for physical assistance General bed mobility comments: Pt required assist for all aspects of bed mobility, although initiating more movement than previous session. Bed pad was used for scooting and positioning.  Transfers Overall transfer level: Needs assistance Equipment used: None Transfers: Sit to/from Stand Sit to Stand: Supervision General transfer comment:  Further mobility deferred as HD was present to take pt for session.  Ambulation/Gait Ambulation/Gait assistance: Supervision Ambulation Distance (Feet): 50 Feet Assistive device: (IV pole) Gait Pattern/deviations: Step-through pattern, Decreased stride length Gait velocity interpretation: Below normal speed for age/gender General Gait Details: Mild unsteadiness noted during gait however no LOB. use of IV pole to simulate SPC. Ambulated within room and to/from bathroom. Dyspnea present. HR 112 bpm.    ADL:    Cognition: Cognition Overall Cognitive Status: Within Functional Limits for tasks assessed Orientation Level: Oriented to person, Oriented to place, Disoriented to time, Disoriented to situation Cognition Arousal/Alertness: Awake/alert Behavior During Therapy: WFL for tasks assessed/performed Overall Cognitive Status: Within Functional Limits for tasks assessed  Blood pressure 123/53, pulse 92, temperature 98.6 F (37 C), temperature source Oral, resp. rate 24, height $RemoveBe'5\' 5"'uOeIucvlt$  (1.651 m), weight 51.5 kg (113 lb 8.6 oz), last menstrual period 07/18/2012, SpO2 96 %. Physical Exam  Constitutional:  54 year old right-handed African-American female with nasogastric tube in place.  HENT:  Head: Normocephalic.  Eyes:  Pupils sluggish to light  Neck: Normal range of motion. Neck supple. No thyromegaly present.  Cardiovascular: Normal rate and regular rhythm.  Respiratory:  Decreased breath sounds at the bases  GI: Soft. Bowel sounds are normal. She exhibits no distension.  Neurological:  Patient is lethargic difficult to arouse. She will make eye contact with examiner a fall back asleep which did limit the overall exam. She did not follow commands. Occasionally he has no head nods that were inconsistent  Skin: Skin is warm and dry.  4/5 bilateral delt bi tri grip 4/5 R HF, KE, ADF, 3+ Left HF, KE, ADF Sensation difficult to assess secondary to hypophonic dysarthria grossly intact  LT BUE and BLE   Lab Results Last 24 Hours    Results for orders placed or performed during the hospital encounter of 01/18/15 (from the past 24 hour(s))  Glucose, capillary Status: Abnormal   Collection Time: 02/06/15 11:27 AM  Result Value Ref Range   Glucose-Capillary 147 (H) 70 - 99 mg/dL  Glucose, capillary Status: Abnormal   Collection Time: 02/06/15 8:40 PM  Result Value Ref Range   Glucose-Capillary 143 (H) 70 - 99 mg/dL  Glucose, capillary Status: Abnormal   Collection Time: 02/06/15 11:37 PM  Result Value Ref Range   Glucose-Capillary 134 (H) 70 - 99 mg/dL   Comment 1 Notify RN   Glucose, capillary Status: Abnormal   Collection Time: 02/07/15 5:22 AM  Result Value Ref Range   Glucose-Capillary 139 (H) 70 - 99 mg/dL  Renal function panel (daily at 0500) Status: Abnormal   Collection Time: 02/07/15 5:25 AM  Result Value Ref Range   Sodium 140 135 - 145 mmol/L   Potassium 3.6 3.5 - 5.1 mmol/L   Chloride 102 96 - 112 mmol/L   CO2 28 19 - 32 mmol/L   Glucose, Bld  146 (H) 70 - 99 mg/dL   BUN 13 6 - 23 mg/dL   Creatinine, Ser 2.79 (H) 0.50 - 1.10 mg/dL   Calcium 9.5 8.4 - 10.5 mg/dL   Phosphorus 2.1 (L) 2.3 - 4.6 mg/dL   Albumin 2.2 (L) 3.5 - 5.2 g/dL   GFR calc non Af Amer 18 (L) >90 mL/min   GFR calc Af Amer 21 (L) >90 mL/min   Anion gap 10 5 - 15  Glucose, capillary Status: Abnormal   Collection Time: 02/07/15 8:06 AM  Result Value Ref Range   Glucose-Capillary 148 (H) 70 - 99 mg/dL      Imaging Results (Last 48 hours)    Dg Chest Port 1 View  02/05/2015 CLINICAL DATA: Dialysis catheter insertion EXAM: PORTABLE CHEST - 1 VIEW COMPARISON: Study obtained earlier in the day FINDINGS: Dual-lumen catheter present with tips near the cavoatrial junction. Left jugular catheter tip is in the superior vena cava. Feeding tube tip  is below the diaphragm. Endotracheal tube is been removed. No pneumothorax. No edema or consolidation. Heart is upper normal in size with pulmonary vascular within normal limits. No adenopathy. IMPRESSION: Tube and catheter positions as described without pneumothorax. No edema or consolidation. Electronically Signed By: Lowella Grip III M.D. On: 02/05/2015 13:47   Dg Fluoro Guide Cv Line-no Report  02/05/2015 CLINICAL DATA: FLOURO GUIDE CV LINE Fluoroscopy was utilized by the requesting physician. No radiographic interpretation.     Assessment/Plan: Diagnosis: deconditioning related to respiratory failure, acute on chronic renal failure in a pt with hx of brainstem infarct right paramedian pontine 1. Does the need for close, 24 hr/day medical supervision in concert with the patient's rehab needs make it unreasonable for this patient to be served in a less intensive setting? Yes 2. Co-Morbidities requiring supervision/potential complications: tachycardia, new onset HD, bowel incontinence, encephalopathy 3. Due to bowel management, safety, skin/wound care, disease management, medication administration, pain management and patient education, does the patient require 24 hr/day rehab nursing? Yes 4. Does the patient require coordinated care of a physician, rehab nurse, PT (1-2 hrs/day, 5 days/week), OT (1-2 hrs/day, 5 days/week) and SLP (.5-1 hrs/day, 5 days/week) to address physical and functional deficits in the context of the above medical diagnosis(es)? Yes Addressing deficits in the following areas: balance, endurance, locomotion, strength, transferring, bowel/bladder control, bathing, dressing, feeding, grooming, toileting, cognition, speech, language, swallowing and psychosocial support 5. Can the patient actively participate in an intensive therapy program of at least 3 hrs of therapy per day at least 5 days per week? No 6. The potential for patient to make measurable gains  while on inpatient rehab is good once able to tolerate 7. Anticipated functional outcomes upon discharge from inpatient rehab are supervision and min assist with PT, supervision and min assist with OT, supervision and min assist with SLP. 8. Estimated rehab length of stay to reach the above functional goals is: 12-16d 9. Does the patient have adequate social supports and living environment to accommodate these discharge functional goals? Potentially 10. Anticipated D/C setting: Home 11. Anticipated post D/C treatments: [facility] therapy 12. Overall Rehab/Functional Prognosis: good  RECOMMENDATIONS: This patient's condition is appropriate for continued rehabilitative care in the following setting: CIR once tachycardia is controlled and pt tolerating PT/OT Patient has agreed to participate in recommended program. Yes Note that insurance prior authorization may be required for reimbursement for recommended care.  Comment:     02/07/2015       Revision History     Date/Time  User Provider Type Action   02/07/2015 12:05 PM [name], MD Physician Sign   02/07/2015 9:46 AM Cathlyn Parsons, PA-C Physician Assistant Pend   View Details Report       Routing History     Date/Time From To Method   02/07/2015 12:05 PM [name], MD [name], MD In Basket   02/07/2015 12:05 PM [name], MD Darreld Mclean, MD In Basket

## 2015-02-15 NOTE — Progress Notes (Signed)
PMR Admission Coordinator Pre-Admission Assessment  Patient: [name] [name] is an 54 y.o., female MRN: [id number] DOB: 29-Jul-1961 Height: 5\' 5"  (165.1 cm) Weight: 54.5 kg (120 lb 2.4 oz)  Insurance Information HMO: PPO: PCP: IPA: 80/20: OTHER: open access plus plan PRIMARY: [employer] Policy#: [id number] Subscriber: pt CM Name: Rayetta Humphrey Phone#: [phone] ext [place] Fax#: [phone] Pre-Cert#: [id number] Employer: approved for 7 days with updates needed 4/21 Benefits: Phone #: [phone] Name: 4/13 Eff. Date: 12/04/12 Deduct: $1500 met Out of Pocket Max: $3500 met Life Max: unlimited CIR: 80% SNF: 80% 100 days Outpatient: $50 copay per visit Co-Pay: unlimited visits Home Health: 80% Co-Pay: 100 days per year DME: 80% Co-Pay: 20% Providers: in network  SECONDARY: none   Medicaid Application Date: Case Manager:  Disability Application Date: Case Worker:   Emergency Contact Information Contact Information    Name Relation Home Work Mobile   [place] [phone]   Fidel,Brittany Daughter   [phone]   Lahna, Nath Sister   [phone]   Neko, Mcgeehan Mother   [phone]     Current Medical History  Patient Admitting Diagnosis: deconditioning related to respiratory failure, acute on chronic renal failure in a pt with hx of brainstem infarct right paramedian pontine  History of Present Illness: [name] [name] is a 54 y.o. right handed female with history of remote right inferior brainstem hemorrhage, hypertension, diabetes mellitus or peripheral neuropathy, chronic renal insufficiency with baseline creatinine 2.97, lupus. Independent with a cane prior to admission living with  her-2 adult sons.  Presented 01/18/2015 with vague complaints of shortness of breath, diarrhea, [place] and cough. Troponin mildly elevated 0.57. Chest x-ray showed no acute pulmonary process. Patient developed intermittent SVT with persistent tachycardia heart rate 150s became hypoxic requiring NRB and vent support. Cranial CT scan negative. MRI of the brain again showed remote right inferior brainstem hemorrhage without acute findings.. Venous Doppler study negative. Echocardiogram with ejection fraction of 50% severe hypokinesis. EEG with no seizure activity but did show diffuse disturbance etiologically nonspecific suspect metabolic encephalopathy. Acute on chronic renal failure follow-up renal services hemodialysis initiated and pt in [place] process for outpatient center set up. Left AVF placed 4/13. Patient was extubated 01/05/2015. Currently nothing by mouth with nasogastric tube feeds. SLP recommended 4/15 MBS follow up.Subcutaneous heparin for DVT prophylaxis.    Past Medical History  Past Medical History  Diagnosis Date  . Hypertension   . Lupus   . CKD (chronic kidney disease)     due to lupus/Dr. [name]  . Diabetes mellitus   . Stenosis of cervical spine region     with HNP at C5/6, C6/7  . Metatarsal bone fracture right    4th  . 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 Arthritis     RA  . Anemia   . Stroke 11/2014    left sided weakness, dysphagia  . Pain in joints   . Paresthesias   . Hyperlipidemia   . Colitis     ? per hospital notes 11/2014  . Lower GI bleed   . Hyponatremia   . Metabolic acidosis   . Hyperplastic colon polyp   . Hemorrhoids   . Blood transfusion without reported diagnosis     Family History  family history includes Diabetes in her father and mother; Hypertension in her maternal grandmother, mother, sister, and  another family member; Lupus in an other family member; Rheum arthritis  in an other family member.  Prior Rehab/Hospitalizations: CIR 12/15 and d/c home at Mod I level  Current Medications   Current facility-administered medications:  . acetaminophen (TYLENOL) tablet 650 mg, 650 mg, Oral, Q6H PRN, Geradine Girt, DO, 650 mg at 02/13/15 2043 . antiseptic oral rinse (CPC / CETYLPYRIDINIUM CHLORIDE 0.05%) solution 7 mL, 7 mL, Mouth Rinse, q12n4p, Wilhelmina Mcardle, MD, 7 mL at 02/14/15 1600 . chlorhexidine (PERIDEX) 0.12 % solution 15 mL, 15 mL, Mouth Rinse, BID, Wilhelmina Mcardle, MD, 15 mL at 02/14/15 2036 . Darbepoetin Alfa (ARANESP) injection 150 mcg, 150 mcg, Intravenous, Q Tue-HD, Jamal Maes, MD, 150 mcg at 02/06/15 1554 . feeding supplement (NEPRO CARB STEADY) liquid 1,000 mL, 1,000 mL, Per Tube, Continuous, Kallie Locks, RD, Last Rate: 40 mL/hr at 02/14/15 1354, 1,000 mL at 02/14/15 1354 . feeding supplement (PRO-STAT SUGAR FREE 64) liquid 30 mL, 30 mL, Per Tube, Daily, Stephanie La, RD, 30 mL at 02/15/15 1043 . ferrous sulfate tablet 325 mg, 325 mg, Oral, BID WC, Belkys A Regalado, MD, 325 mg at 02/14/15 1700 . folic acid (FOLVITE) tablet 1 mg, 1 mg, Oral, Daily, Allyne Gee, MD, 1 mg at 02/15/15 1043 . free water 100 mL, 100 mL, Per Tube, TID, Kallie Locks, RD, 100 mL at 02/15/15 1043 . heparin injection 5,000 Units, 5,000 Units, Subcutaneous, 3 times per day, Rigoberto Noel, MD, 5,000 Units at 02/15/15 0539 . hydrocortisone-pramoxine (PROCTOFOAM-HC) rectal foam 1 applicator, 1 applicator, Rectal, BID PRN, Raylene Miyamoto, MD . hydroxychloroquine (PLAQUENIL) tablet 200 mg, 200 mg, Oral, BID, Fleet Contras, MD, 200 mg at 02/15/15 1043 . labetalol (NORMODYNE) tablet 250 mg, 250 mg, Per Tube, 3 times per day, Allie Bossier, MD, 250 mg at 02/15/15 0539 . magic mouthwash, 5 mL, Oral, TID PRN, Geradine Girt, DO . metoprolol (LOPRESSOR) injection 2.5-5 mg, 2.5-5  mg, Intravenous, Q3H PRN, Vilinda Boehringer, MD, 5 mg at 02/01/15 1204 . multivitamin (RENA-VIT) tablet 1 tablet, 1 tablet, Oral, QHS, Allyne Gee, MD, 1 tablet at 02/14/15 2129 . mycophenolate (CELLCEPT) oral suspension 50 mg/mL, 500 mg, Oral, BID, Fleet Contras, MD, 500 mg at 02/15/15 1043 . neomycin-bacitracin-polymyxin (NEOSPORIN) ointment, , Topical, TID, Allie Bossier, MD . ondansetron [facility]) tablet 4 mg, 4 mg, Oral, Q6H PRN **OR** ondansetron (ZOFRAN) injection 4 mg, 4 mg, Intravenous, Q6H PRN, Allyne Gee, MD, 4 mg at 01/21/15 0226 . oxyCODONE-acetaminophen (PERCOCET/ROXICET) 5-325 MG per tablet 1 tablet, 1 tablet, Oral, Q4H PRN, Alvia Grove, PA-C . pantoprazole sodium (PROTONIX) 40 mg/20 mL oral suspension 40 mg, 40 mg, Per Tube, QHS, Priscella Mann, RPH, 40 mg at 02/14/15 2130 . thiamine (VITAMIN B-1) tablet 100 mg, 100 mg, Oral, Daily, Allyne Gee, MD, 100 mg at 02/15/15 1043  Patients Current Diet: NPO with panda tube feeds Nepro at goal rate of 40 ml/hr with Prostat Dietitian notes pt meets criteria for severe malnutrition with 11% weight loss in two weeks and severe muscle wasting  Precautions / Restrictions Precautions Precautions: Fall Precaution Comments: Droplet and enteric precautions Restrictions Weight Bearing Restrictions: No   Prior Activity Level Pt has not worked since [id number]. She is on FMLA per pt. Mod I with cane or RW. Not yet driving. Sees Dr. Letta Pate as an oupt. LUE with residual weakness from previus CVA.  I have left message 4/14 for pt's sister and dtr to contact me to clarify if pt has applied for disability before. Her Christella Scheuermann is still active, but  pt doesn't understand how. Will need likely follow up on disability. Pt's two adult sons live with her. 54, daughter, and sister work, but Remo Lipps (sister) is aware that 24/7 supervision is recommended at d/c this time. Last CIR admit pt reached Mod I level.  Home Assistive Devices /  Equipment Home Assistive Devices/Equipment: Environmental consultant (specify type), Cane (specify quad or straight), Grab bars in shower, Grab bars around toilet, Eyeglasses, Shower chair with back Home Equipment: Walker - 2 wheels, Cane - single point, Shower seat, Bedside commode  Prior Functional Level Prior Function Level of Independence: Independent with assistive device(s) Comments: Pt reports on her bad days she uses the cane or the RW.   Current Functional Level Cognition  Overall Cognitive Status: Within Functional Limits for tasks assessed Orientation Level: Oriented X4   Extremity Assessment (includes Sensation/Coordination)  Upper Extremity Assessment: Overall WFL for tasks assessed  Lower Extremity Assessment: Generalized weakness    ADLs  Overall ADL's : Needs assistance/impaired Eating/Feeding: NPO Grooming: Wash/dry hands, Standing, Supervision/safety Upper Body Bathing: Set up, Supervision/ safety, Sitting Lower Body Bathing: Minimal assistance (with Mod A sit<>stand) Upper Body Dressing : Set up, Standing Lower Body Dressing: Minimal assistance (with Mod A sit<>stand) Toilet Transfer: Min guard, RW, Ambulation, BSC (over toilet) Toileting- Clothing Manipulation and Hygiene: Moderate assistance, Sit to/from stand Toileting - Clothing Manipulation Details (indicate cue type and reason): assist to clean up after BM Functional mobility during ADLs: Min guard, Rolling walker General ADL Comments: Patient completed UB/LB bathing in sit<>stand position from recliner. Patient required min verbal cues for safety with use of RW and therapist reiterated importance of using RW during all transfers for safety at this time. Educated patient and NT on importance of patient ambulating <> BR for toilet transfers and toielting needs to help patient regain her strength, endurance, and independence.     Mobility  Overal bed mobility: Needs Assistance Bed Mobility: Supine to Sit, Sit to  Supine Supine to sit: Supervision Sit to supine: Supervision General bed mobility comments: no physical assist, use of rail    Transfers  Overall transfer level: Needs assistance Equipment used: Rolling walker (2 wheeled) Transfers: Sit to/from Stand Sit to Stand: Min guard General transfer comment: min guard for safety, good technique    Ambulation / Gait / Stairs / Wheelchair Mobility  Ambulation/Gait Ambulation/Gait assistance: Physicist, medical (Feet): 100 Feet Assistive device: Rolling walker (2 wheeled) Gait Pattern/deviations: Step-through pattern, Decreased stance time - left, Decreased step length - right Gait velocity: Decreased Gait velocity interpretation: Below normal speed for age/gender General Gait Details: Pt used 2wRW well with minimal verbal cueing and appeared to bear weight evenly between UE's and LE's. No LOB during gait, but stayed within center of mass throughout.    Posture / Balance Dynamic Sitting Balance Sitting balance - Comments: Max assist required at times for sitting balance.  Balance Overall balance assessment: Needs assistance Sitting-balance support: No upper extremity supported, Feet supported Sitting balance-Leahy Scale: Good Sitting balance - Comments: Max assist required at times for sitting balance.  Postural control: Posterior lean, Right lateral lean, Left lateral lean Standing balance support: Bilateral upper extremity supported, During functional activity Standing balance-Leahy Scale: Fair Standing balance comment: able to release walker to perform standing grooming    Special needs/care consideration Dialysis new ESRD Days currently T, TH, Sat Judy at dialysis [phone] working on Clip to [employer] center 4/23 Sacral dressing per 4/6 2.5 x 2 x 0 cm 100 % granulation with  foam dressing Skin WOC 4/11  Tenna Child, RN Registered Nurse Signed WOC Consult Note 02/12/2015 2:46 PM    Expand All  Collapse All  WOC follow-up: Area below nare/above lip with previous deep tissue injury, has evolved into stage 2 wound; 1X1X.1cm, red and moist, no odor or drainage. Continue antibiotic ointment to promote moist healing. Discussed plan of care with patient and she verbalizes understanding. Please re-consult if further assistance is needed. Gae Dry MSN, RN, CWOCN, [place], [place] [phone]            Bowel mgmt: 3 loose BMS 4/13 after change of tube feeding. Pt placed on enteric precautions. As of 4/14 at 12 no BM to be sent for cdiff analysis. Bladder mgmt:continent Diabetic mgmt yes   Previous Home Environment Living Arrangements: Children Available Help at Discharge: Available 24 hours/day, Family Type of Home: Apartment Home Layout: Two level, Able to live on main level with bedroom/bathroom Alternate Level Stairs-Number of Steps: flight Home Access: Stairs to enter Entrance Stairs-Rails: Right Entrance Stairs-Number of Steps: 4 Bathroom Shower/Tub: Gaffer, Charity fundraiser: Handicapped height Bathroom Accessibility: Yes [place]: Yes Type of Home Care Services: [place] (if known): advance home care Additional Comments: Pt was previously receiving [facility] and PT  Discharge Living Setting Plans for Discharge Living Setting: Apartment Type of Home at Discharge: Apartment Discharge Home Layout: Two level, Able to live on main level with bedroom/bathroom Alternate Level Stairs-Number of Steps: flight Discharge Home Access: Stairs to enter Entrance Stairs-Rails: Right Entrance Stairs-Number of Steps: 4 Discharge Bathroom Shower/Tub: Walk-in shower Discharge Bathroom Toilet: Handicapped height Discharge Bathroom Accessibility: Yes How Accessible: Accessible via walker Does the patient have any problems obtaining your medications?: No  Social/Family/Support Systems Patient Roles: Parent Caregiver  Availability: 24/7 Discharge Plan Discussed with Primary Caregiver: Yes Is Caregiver In Agreement with Plan?: Yes Does Caregiver/Family have Issues with Lodging/Transportation while Pt is in Rehab?: No  Goals/Additional Needs Patient/Family Goal for Rehab: supervision PT, OT, and SLP Expected length of stay: ELOS 10-12 days Equipment Needs: [facility]SD this admission Special Service Needs: Out pt dialysis in center new Additional Information: Bethena Roys in dialysis at [phone] is clipping pt Pt/Family Agrees to Admission and willing to participate: Yes Program Orientation Provided & Reviewed with Pt/Caregiver Including Roles & Responsibilities: Yes  Decrease burden of Care through IP rehab admission: n/a  Possible need for SNF placement upon discharge:not anticipated  Patient Condition: This patient's medical and functional status has changed since the consult dated: 02/07/2015 in which the Rehabilitation Physician determined and documented that the patient's condition is appropriate for intensive rehabilitative care in an inpatient rehabilitation facility. See "History of Present Illness" (above) for medical update. Functional changes are: min assist with PT and OT, NPO with panda feeds. Patient's medical and functional status update has been discussed with the Rehabilitation physician and patient remains appropriate for inpatient rehabilitation. Will admit to inpatient rehab today.  Preadmission Screen Completed By: Cleatrice Burke, 02/15/2015 12:37 PM ______________________________________________________________________  Discussed status with Dr. Letta Pate on 02/15/15 at 1228 and received telephone approval for admission today.  Admission Coordinator: Cleatrice Burke, time 1228 Date 02/15/15.          Cosigned by: [name], MD at 02/15/2015 12:43 PM  Revision History     Date/Time User Provider Type Action   02/15/2015 12:43 PM [name], MD Physician  Cosign   02/15/2015 12:37 PM Cristina Gong, RN Rehab  Admission Coordinator Sign

## 2015-02-16 ENCOUNTER — Inpatient Hospital Stay (HOSPITAL_COMMUNITY): Payer: Managed Care, Other (non HMO) | Admitting: Speech Pathology

## 2015-02-16 ENCOUNTER — Inpatient Hospital Stay (HOSPITAL_COMMUNITY): Payer: Managed Care, Other (non HMO)

## 2015-02-16 ENCOUNTER — Encounter (HOSPITAL_COMMUNITY): Payer: Managed Care, Other (non HMO) | Admitting: Speech Pathology

## 2015-02-16 ENCOUNTER — Inpatient Hospital Stay (HOSPITAL_COMMUNITY): Payer: Managed Care, Other (non HMO) | Admitting: Occupational Therapy

## 2015-02-16 ENCOUNTER — Inpatient Hospital Stay (HOSPITAL_COMMUNITY): Payer: Managed Care, Other (non HMO) | Admitting: Rehabilitation

## 2015-02-16 DIAGNOSIS — I69391 Dysphagia following cerebral infarction: Secondary | ICD-10-CM

## 2015-02-16 DIAGNOSIS — R5381 Other malaise: Principal | ICD-10-CM

## 2015-02-16 LAB — GLUCOSE, CAPILLARY
GLUCOSE-CAPILLARY: 97 mg/dL (ref 70–99)
Glucose-Capillary: 100 mg/dL — ABNORMAL HIGH (ref 70–99)
Glucose-Capillary: 104 mg/dL — ABNORMAL HIGH (ref 70–99)
Glucose-Capillary: 99 mg/dL (ref 70–99)
Glucose-Capillary: 94 mg/dL (ref 70–99)

## 2015-02-16 MED ORDER — LABETALOL HCL 200 MG PO TABS
250.0000 mg | ORAL_TABLET | Freq: Three times a day (TID) | ORAL | Status: DC
  Administered 2015-02-16 – 2015-02-22 (×17): 250 mg via ORAL
  Filled 2015-02-16 (×21): qty 0.5

## 2015-02-16 MED ORDER — INSULIN ASPART 100 UNIT/ML ~~LOC~~ SOLN: 0.0000 [IU] | Freq: Three times a day (TID) | SUBCUTANEOUS | Status: DC

## 2015-02-16 MED ORDER — NEPRO/CARBSTEADY PO LIQD
237.0000 mL | Freq: Two times a day (BID) | ORAL | Status: DC
  Administered 2015-02-16 – 2015-02-21 (×8): 237 mL via ORAL

## 2015-02-16 MED ORDER — DOXERCALCIFEROL 4 MCG/2ML IV SOLN
1.0000 ug | INTRAVENOUS | Status: DC
  Administered 2015-02-17 – 2015-02-22 (×3): 1 ug via INTRAVENOUS
  Filled 2015-02-16 (×3): qty 2

## 2015-02-16 NOTE — Progress Notes (Signed)
Social Work Assessment and Plan Social Work Assessment and Plan  Patient Details  Name: [name] [name] MRN: [id number] Date of Birth: 1961/06/02  Today's Date: 02/16/2015  Problem List:  Patient Active Problem List   Diagnosis Date Noted  . Debilitated 02/15/2015  . Acute respiratory failure   . SVT (supraventricular tachycardia)   . Other specified hypotension   . Acute diastolic CHF (congestive heart failure)   . Protein-calorie malnutrition   . Debility 02/07/2015  . Encephalopathy acute   . Atelectasis, left   . Confusion   . Acute respiratory failure with hypoxia 01/22/2015  . ARDS (adult respiratory distress syndrome) 01/22/2015  . Tachycardia   . Weakness 01/19/2015  . Elevated troponin level 01/19/2015  . Hypotension 01/19/2015  . Absolute anemia   . Shortness of breath 01/18/2015  . Diarrhea 12/07/2014  . Hemorrhoid 12/07/2014  . HCAP (healthcare-associated pneumonia) 11/23/2014  . Fever   . Bleeding gastrointestinal   . Tremor 11/21/2014  . Protein-calorie malnutrition, severe 11/20/2014  . GIB (gastrointestinal bleeding) 11/19/2014  . CKD (chronic kidney disease) stage 4, GFR 15-29 ml/min 11/19/2014  . Abdominal pain 11/19/2014  . Noninfectious gastroenteritis and colitis 11/19/2014  . Melena   . Acute blood loss anemia   . Acute on chronic renal failure   . Hyponatremia   . Hyperglycemia   . MCTD (mixed connective tissue disease)   . SLE (systemic lupus erythematosus)   . Secondary cardiomyopathy   . Noncompliance with medication regimen   . Tobacco abuse   . Sepsis 10/10/2014  . Leucocytosis 10/10/2014  . Fluid overload 10/10/2014  . Chronic kidney disease 10/10/2014  . SIRS (systemic inflammatory response syndrome) 10/10/2014  . Acute renal failure superimposed on stage 4 chronic kidney disease 10/10/2014  . Anemia 10/10/2014  . Hyperkalemia 10/10/2014  . Acute encephalopathy 10/10/2014  . Membranous glomerulonephritis   . Torticollis, acquired  10/06/2014  . Essential hypertension 10/05/2014  . Acute respiratory acidosis   . ICH (intracerebral hemorrhage)   . Chronic ankle pain   . Cerebral hemorrhage   . Malignant hypertension   . History of stroke 09/28/2014  . 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 Hypertensive emergency 09/28/2014  . Iron deficiency anemia 02/18/2008  . 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 History of adrenal insufficiency 02/18/2008  . DIABETES MELLITUS, TYPE II 10/25/2007  . HYPERLIPIDEMIA 10/25/2007  . UNSPECIFIED ULCERATION OF VULVA 10/25/2007  . Systemic lupus erythematosus 10/25/2007  . Rheumatoid arthritis 10/25/2007  . HYPERTENSION NEC 10/25/2007  . COLONIC POLYPS, HYPERPLASTIC 04/08/2006  . HEMORRHOIDS 04/08/2006   Past Medical History:  Past Medical History  Diagnosis Date  . Hypertension   . Lupus   . CKD (chronic kidney disease)     due to lupus/Dr. [name]  . Diabetes mellitus   . Stenosis of cervical spine region     with HNP at C5/6, C6/7  . Metatarsal bone fracture right    4th  . 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 Arthritis     RA  . Anemia   . Stroke 11/2014    left sided weakness, dysphagia  . Pain in joints   . Paresthesias   . Hyperlipidemia   . Colitis     ? per hospital notes 11/2014  . Lower GI bleed   . Hyponatremia   . Metabolic acidosis   . Hyperplastic colon polyp   . Hemorrhoids   .  Blood transfusion without reported diagnosis    Past Surgical History:  Past Surgical History  Procedure Laterality Date  . Breast biopsy    . Tubal ligation    . Insertion of dialysis catheter Right 02/05/2015    Procedure: INSERTION OF DIALYSIS CATHETER - RIGHT INTERNAL JUGULAR;  Surgeon: Conrad Thomson, MD;  Location: [facility];  Service: Vascular;  Laterality: Right;  . Bascilic vein transposition Left 02/14/2015    Procedure: BASILIC VEIN Fistula Creation First Stage;  Surgeon: Rosetta Posner, MD;   Location: [facility];  Service: Vascular;  Laterality: Left;   Social History:  reports that she has quit smoking. Her smoking use included Cigarettes. She has a 15 pack-year smoking history. She has never used smokeless tobacco. She reports that she does not drink alcohol or use illicit drugs.  Family / Support Systems Marital Status: Divorced Patient Roles: Parent, Partner Spouse/Significant Other: Johnney Ou [phone]  [phone] Children: Brittany-daughter  [phone]  Rodney-son [phone] Other Supports: Joan-sister [phone] Anticipated Caregiver: family members and boyfriend Ability/Limitations of Caregiver: between all of them they can piece together 24 hr care-daughter doesn't work during the day now Building control surveyor Availability: 24/7 Family Dynamics: Close knit family her two adult son's live with her and her daughter lives down the street now.  All are involved with one another and supportive.  They assisted pt after going home in Dec 2015 after having a stroke.  She can count on them and they can her.  Social History Preferred language: English Religion: Holiness Cultural Background: No issues Education: High School Read: Yes Write: Yes Employment Status: Unemployed Date Retired/Disabled/Unemployed: Was let go after FMLA expired from [place] where she worked for years. Legal Hisotry/Current Legal Issues: No issues-has applied for disability Guardian/Conservator: None-according to MD pt is capable of making her own decisions while here.   Abuse/Neglect Physical Abuse: Denies Verbal Abuse: Denies Sexual Abuse: Denies Exploitation of patient/patient's resources: Denies Self-Neglect: Denies  Emotional Status Pt's affect, behavior adn adjustment status: Pt is familiar with rehab and she feels like this is the best place for her.  She is hoping her swallow gets better so she can get the NG tube out.  She is moving better than she thought and is happy  about this. She wants to take care of herself and be independent again. Recent Psychosocial Issues: Other health issues-recent CVA 10/2014 and now kidney issues.  Waiting on disability determination Pyschiatric History: No history at this time feels she is coping ok, doesn;t feel depression screen is necessary.  Will monitor her throughout stay and have neuro-psych intervene if needed. Substance Abuse History: quit tobacco since last admit  Patient / Family Perceptions, Expectations & Goals Pt/Family understanding of illness & functional limitations: Pt and sister have a good understanding of her condition, have both spoken with MD's and feel their questions are being answered.  Hopeful this swallow test went well and she can get the NG tube out. Premorbid pt/family roles/activities: Mother, significant other, sister, church member, etc Anticipated changes in roles/activities/participation: resume Pt/family expectations/goals: Pt states: " I want to take care of myself, I don't want to burden my family."  Sister states: " I hope she does well like before and can do for herself."  [employer]: Other (Comment) (New HD patient) Premorbid Home Care/DME Agencies: Other (Comment) ([facility] still following) Transportation available at discharge: Family  Resource referrals recommended: Neuropsychology, Support group (specify)  Discharge Planning Living Arrangements: Children Support Systems: Children, Parent, Other relatives,  Friends/neighbors, Spouse/significant other, Church/faith community Type of Residence: Private residence Insurance Resources: Multimedia programmer (specify) Psychologist, counselling) Financial Resources: Family Support, Other (Comment) (pending disability) Financial Screen Referred: Yes Living Expenses: Own Money Management: Patient, Family Does the patient have any problems obtaining your medications?: No Home Management: family and patient Patient/Family Preliminary  Plans: Return home with family members providing assistance like they did before-10/2014.  Pt is familiar with home health and has equipment already from last time. Will await team's evaluations and work on discharge plan. Social Work Anticipated Follow Up Needs: HH/OP, Support Group  Clinical Impression Pleasant female who is known to this worker due to recent admission last Dec.  She did well and reached mod/i level last time.  She is moving well, her swallow seems to the biggest concern this time. MBS pending had this am.  Children and her sister are supportive and will provide care if needed. Await team's evaluations and work on discharge plan.  Assist with disability application if needed. Assist HD coordination also.  Elease Hashimoto 02/16/2015, 2:05 PM

## 2015-02-16 NOTE — Progress Notes (Signed)
54 y.o. right handed female with history of remote right inferior brainstem hemorrhage and received inpatient rehabilitation services November 2015, hypertension, diabetes mellitus or peripheral neuropathy, chronic renal insufficiency with baseline creatinine 2.97, lupus. Independent with a cane prior to admission living with her-2 adult sons. Presented 01/18/2015 with vague complaints of shortness of breath, diarrhea, Mallaise and cough. Troponin mildly elevated 0.57. Chest x-ray showed no acute pulmonary process. Patient developed intermittent SVT with persistent tachycardia heart rate 150s became hypoxic requiring NRB and vent support and ultimately extubated 02/05/2015. Cranial CT scan negative. MRI of the brain again showed remote right inferior brainstem hemorrhage without acute findings.. Venous Doppler study negative. Echocardiogram with ejection fraction of 50% severe hypokinesis. EEG with no seizure activity but did show diffuse disturbance etiologically nonspecific suspect metabolic encephalopathy. Acute on chronic renal failure follow-up renal services hemodialysis initiated and underwent left basilic vein transposition fistula 02/14/2015 per Dr.Early .Marland Kitchen Currently nothing by mouth with nasogastric tube feeds  Subjective/Complaints:   Objective: Vital Signs: Blood pressure 125/66, pulse 88, temperature 98.8 F (37.1 C), temperature source Oral, resp. rate 15, weight 54.6 kg (120 lb 5.9 oz), last menstrual period 07/18/2012, SpO2 100 %. No results found. Results for orders placed or performed during the hospital encounter of 02/15/15 (from the past 72 hour(s))  Glucose, capillary     Status: Abnormal   Collection Time: 02/15/15  8:01 PM  Result Value Ref Range   Glucose-Capillary 126 (H) 70 - 99 mg/dL  Glucose, capillary     Status: Abnormal   Collection Time: 02/15/15 11:55 PM  Result Value Ref Range   Glucose-Capillary 114 (H) 70 - 99 mg/dL  Glucose, capillary     Status: Abnormal   Collection Time: 02/16/15  4:19 AM  Result Value Ref Range   Glucose-Capillary 100 (H) 70 - 99 mg/dL   Vitals reviewed. Constitutional: She is oriented to person, place, and time. She appears well-developed.  Mood/affect are appropriate HENT: NG tube Left nares, voice quiet but not wet Head: Normocephalic.  Eyes: EOM are normal.  Neck: Normal range of motion. Neck supple. No thyromegaly present.  Cardiovascular: Normal rate and regular rhythm.  Respiratory: Effort normal and breath sounds normal. No respiratory distress.  GI: Soft. Bowel sounds are normal. She exhibits no distension.  Neurological: She is alert and oriented to person, place, and time.  Follows commands  Skin:  Fistula site clean and dry  4 / 5 bilateral deltoid bicep tricep and grip 4/5 right hip flexors, knee extension, ADF, 3+ Left hip flexors, 4/5 Left knee ext ankle DF Sensation is intact to LT bilateral feet Assessment/Plan: 1. Functional deficits secondary to Deconditioning respiratory failure which require 3+ hours per day of interdisciplinary therapy in a comprehensive inpatient rehab setting. Physiatrist is providing close team supervision and 24 hour management of active medical problems listed below. Physiatrist and rehab team continue to assess barriers to discharge/monitor patient progress toward functional and medical goals. FIM:                   Comprehension Comprehension Mode: Auditory Comprehension: 6-Follows complex conversation/direction: With extra time/assistive device  Expression Expression Mode: Verbal Expression: 7-Expresses complex ideas: With no assist  Social Interaction Social Interaction: 7-Interacts appropriately with others - No medications needed.  Problem Solving Problem Solving Mode: Asleep  Memory Memory: 7-Complete Independence: No helper  Medical Problem List and Plan: 1. Functional deficits secondary to deconditioning secondary to respiratory  failure/history brainstem infarct right paramedian pontine 2. DVT Prophylaxis/Anticoagulation:  Subcutaneous heparin. Monitor platelet counts and any signs of bleeding. Venous Doppler is negative 3. Pain Management: Tylenol as needed 4. Dysphagia. Nasogastric tube feeds. Follow-up speech therapy 5. Neuropsych: This patient is capable of making decisions on her own behalf. 6. Skin/Wound Care: Skin care as directed 7. Fluids/Electrolytes/Nutrition: Strict I&O's with follow-up chemistries 8. End-stage renal disease/SLE. Continue dialysis as per renal services 9.Hypertension/SVT. Labetalol 250 mg 3 times a day. Monitor with increased mobility 10. Acute on Chronic anemia. Follow up cbc.Aranesp weekly 11.Lupus.Cellcept/Plaquenil 12. C. difficile pending. Patient currently on contact precautions. Follow-up stool specimen LOS (Days) 1 A FACE TO FACE EVALUATION WAS PERFORMED  KIRSTEINS,ANDREW E 02/16/2015, 7:34 AM

## 2015-02-16 NOTE — IPOC Note (Signed)
Overall Plan of Care [facility]) Patient Details Name: [name] [name] MRN: [id number] DOB: 08-17-1961  Admitting Diagnosis: old r cva deconditioned   Hospital Problems: Active Problems:   Debilitated     Functional Problem List: Nursing Nutrition, Perception, Safety, Endurance, Medication Management, Skin Integrity, Motor  PT    OT    SLP    TR         Basic ADL's: OT       Advanced  ADL's: OT       Transfers: PT    OT       Locomotion: PT       Additional Impairments: OT    SLP        TR      Anticipated Outcomes Item Anticipated Outcome  Self Feeding    Swallowing      Basic self-care     Toileting      Bathroom Transfers    Bowel/Bladder  cont of bowel and bladder  Transfers     Locomotion     Communication     Cognition     Pain  n/a  Safety/Judgment  Maintain safety with min assist   Therapy Plan:             Team Interventions: Nursing Interventions Patient/Family Education, Medication Management, Dysphagia/Aspiration Precaution Training, Disease Management/Prevention, Skin Care/Wound Management, Discharge Planning, Psychosocial Support  PT interventions    OT Interventions    SLP Interventions    TR Interventions    SW/CM Interventions      Team Discharge Planning: Destination: PT-  ,OT-   , SLP-  Projected Follow-up: PT- , OT-   , SLP-  Projected Equipment Needs: PT- , OT-  , SLP-  Equipment Details: PT- , OT-  Patient/family involved in discharge planning: PT-  ,  OT- , SLP-   MD ELOS: 12-16d Medical Rehab Prognosis:  Good Assessment: 54 y.o. right handed female with history of remote right inferior brainstem hemorrhage and received inpatient rehabilitation services November 2015, hypertension, diabetes mellitus or peripheral neuropathy, chronic renal insufficiency with baseline creatinine 2.97, lupus. Independent with a cane prior to admission living with her-2 adult sons. Presented 01/18/2015 with vague complaints of  shortness of breath, diarrhea, Mallaise and cough. Troponin mildly elevated 0.57. Chest x-ray showed no acute pulmonary process. Patient developed intermittent SVT with persistent tachycardia heart rate 150s became hypoxic requiring NRB and vent support and ultimately extubated 02/05/2015. Cranial CT scan negative. MRI of the brain again showed remote right inferior brainstem hemorrhage without acute findings.. Venous Doppler study negative. Echocardiogram with ejection fraction of 50% severe hypokinesis. EEG with no seizure activity but did show diffuse disturbance etiologically nonspecific suspect metabolic encephalopathy  Now requiring 24/7 Rehab RN,MD, as well as CIR level PT, OT and SLP.  Treatment team will focus on ADLs and mobility and swallowing with goals set at Sup  See Team Conference Notes for weekly updates to the plan of care

## 2015-02-16 NOTE — Progress Notes (Signed)
Speech Language Pathology Note  Patient Details  Name: [name] [name] MRN: [id number] Date of Birth: 21-Apr-1961 Today's Date: 02/16/2015  MBSS complete. Full report located under chart review in imaging section.    [name], Selinda Orion 02/16/2015, 4:36 PM

## 2015-02-16 NOTE — Progress Notes (Signed)
Assessment: 1. ESRD 2. Anemia on aranesp 3. Sec HPTH, Begin Sensipar when eating well  4. SLE 5. S/p BVT 4/13 1st stage  Plan: 1. HD Sat 2. Working on outpt spot for HD 3. Rehab  4 Will try low dose Hectorol for MBD if ca++ remains Ok   Subjective: Interval History: none.  Objective: Vital signs in last 24 hours: Temp:  [97.5 F (36.4 C)-98.8 F (37.1 C)] 97.9 F (36.6 C) (04/15 1524) Pulse Rate:  [86-91] 91 (04/15 1524) Resp:  [15-16] 16 (04/15 1524) BP: (124-142)/(55-66) 142/61 mmHg (04/15 1524) SpO2:  [100 %] 100 % (04/15 1524) Weight:  [54.6 kg (120 lb 5.9 oz)] 54.6 kg (120 lb 5.9 oz) (04/15 0422) Weight change:   Intake/Output from previous day: 04/14 0701 - 04/15 0700 In: 100 [NG/GT:100] Out: -  Intake/Output this shift:Total I/O In: 360 [P.O.:360] Out: -   General appearance: alert and cooperative Nose: Nares normal. Septum midline. Mucosa normal. No drainage or sinus tenderness., NGT out  Resp: clear to auscultation bilaterally Chest wall: no tenderness Extremities: edema none  Lab Results:  Recent Labs  02/14/15 0525 02/15/15 0705  WBC 5.5 5.6  HGB 7.8* 7.8*  HCT 26.2* 25.6*  PLT 266 269   BMET:  Recent Labs  02/14/15 0525 02/15/15 0705  NA 137  138 137  K 4.0  3.9 3.8  CL 99  99 96  CO2 30  30 28   GLUCOSE 89  89 111*  BUN 24*  24* 32*  CREATININE 3.07*  3.08* 4.59*  CALCIUM 9.6  9.6 9.5   No results for input(s): PTH in the last 72 hours. Iron Studies: No results for input(s): IRON, TIBC, TRANSFERRIN, FERRITIN in the last 72 hours. Studies/Results: Dg Swallowing Func-speech Pathology  02/16/2015    Objective Swallowing Evaluation:    Patient Details  Name: [name] [name] MRN: [id number] Date of Birth: [birth year]  Today's Date: 02/16/2015 Time: SLP Start Time: 1000-SLP Stop Time: 1035 SLP Time Calculation (min): 35 min  Past Medical History:  Past Medical History  Diagnosis Date  . Hypertension   . Lupus   . CKD (chronic kidney  disease)     due to lupus/Dr. Hyman Hopes  . Diabetes mellitus   . Stenosis of cervical spine region     with HNP at C5/6, C6/7  . Metatarsal bone fracture right    4th  . 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 Arthritis     RA  . Anemia   . Stroke 11/2014    left sided weakness, dysphagia  . Pain in joints   . Paresthesias   . Hyperlipidemia   . Colitis     ? per hospital notes 11/2014  . Lower GI bleed   . Hyponatremia   . Metabolic acidosis   . Hyperplastic colon polyp   . Hemorrhoids   . Blood transfusion without reported diagnosis    Past Surgical History:  Past Surgical History  Procedure Laterality Date  . Breast biopsy    . Tubal ligation    . Insertion of dialysis catheter Right 02/05/2015    Procedure: INSERTION OF DIALYSIS CATHETER - RIGHT INTERNAL JUGULAR;   Surgeon: Fransisco Hertz, MD;  Location: [facility] OR;  Service: Vascular;   Laterality: Right;  . Bascilic vein transposition Left 02/14/2015    Procedure: BASILIC VEIN Fistula Creation First Stage;  Surgeon: Larina Earthly,  MD;  Location: [facility] OR;  Service: Vascular;  Laterality: Left;   HPI:  HPI: 54 y.o. female with PMH: lupus, CKD. DM, CVA (pons discharged on  regular diet and thin 11/2014), lower GI bleed admitted with with shortness  of breath. Pt experienced acute hypoxic respiratory failure on 3/20,  requiring intubation; extubated 3/29. She required reintubation 3/30-4/4.  Bedside swallow 01/30/15 completed revealing multiple s/s aspiration ,  reduced hyolaryngeal excursion. FEES and MBS completed recommending  continued NPO.  Admitted to CIR on 02/15/2015.  Repeat MBS ordered today to  objectively determine readiness for initiation of PO diet.    Assessment / Plan / Recommendation CHL IP CLINICAL IMPRESSIONS 02/16/2015  Dysphagia Diagnosis Moderate pharyngeal phase dysphagia;Mild oral phase  dysphagia  Clinical impression Pt presents with a mild-moderate oropharyngeal  dysphagia with sensorimotor components.  Oral phase  impairments include  generalized weakness resulting in decreased lingual manipulation of  boluses, impaired mastication of solids, and prolonged oral transit of  materials to the oropharynx.  Pt also presents with decreased base of  tongue retraction and tongue to palate contact which result in poor  posterior containment of boluses orally.  The abovementioned deficits, in  addition to decreased pharyngeal sensation resulted in premature spillage  of materials into the pharynx with swallow response delayed to the level  of the vallecula with honey thick, nectar thick, and pureed boluses.   Swallow response was triggered at the pyriforms with thin liquids and  larger boluses of nectar thick liquids.  Delayed swallow initiation, in  addition to decreased hyolaryngeal excursion resulted in penetration of  large sips of nectar thick and thin liquids as well as thin liquids via  straw.  Penetration remained above the level of the vocal cords and  cleared with cuing for strong volitional cough.  No aspiration or  penetration was visualized with ST intervention for small, controlled cup  sips across viscosities. Mild pharyngeal residue (base of tongue,  posterior pharyngeal wall, and vallecula) was noted with honey thick and  nectar thick liquids more so than with thin liquids, purees, or solid  textures.   Recommend a diet of dys 2 textures with thin liquids and full  supervision for use of the following swallowing precautions: no straws,  small bites/sips, out of bed for meals.   Pt's prognosis for advancement  good with ST follow up while inpatient for  pharyngeal strengthening  exercises, trials of advanced consistencies, and ongoing assessment of  diet toleration.  SLP initiated skilled education regarding currently  prescribed diet and rationale behind recommended swallowing precautions.         CHL IP TREATMENT RECOMMENDATION 02/10/2015  Treatment Plan Recommendations Therapy as outlined in treatment plan      CHL IP  DIET RECOMMENDATION 02/16/2015  Diet Recommendations Dysphagia 2 (Fine chop);Thin liquid  Liquid Administration via Cup;No straw  Medication Administration Crushed with puree  Compensations Slow rate;Small sips/bites  Postural Changes and/or Swallow Maneuvers Out of bed for meals;Upright  30-60 min after meal;Seated upright 90 degrees     CHL IP OTHER RECOMMENDATIONS 02/16/2015     Oral Care Recommendations Oral care Q4 per protocol                                 SLP Swallow Goals  CHL IP REASON FOR REFERRAL 02/16/2015  Reason for Referral Objectively evaluate swallowing function     CHL IP ORAL PHASE 02/16/2015  Oral Phase Impaired        Oral - Honey Teaspoon Weak lingual manipulation;Delayed oral  transit;Lingual/palatal residue;Incomplete tongue to palate contact  Oral - Honey Cup Weak lingual manipulation;Delayed oral  transit;Lingual/palatal residue;Incomplete tongue to palate contact     Oral - Nectar Teaspoon Weak lingual manipulation;Delayed oral  transit;Lingual/palatal residue;Incomplete tongue to palate contact  Oral - Nectar Cup Weak lingual manipulation;Delayed oral  transit;Lingual/palatal residue;Incomplete tongue to palate contact           Oral - Thin Teaspoon Delayed oral transit;Weak lingual  manipulation;Incomplete tongue to palate contact  Oral - Thin Cup Weak lingual manipulation;Delayed oral transit;Incomplete  tongue to palate contact  Oral - Thin Straw Weak lingual manipulation;Incomplete tongue to palate  contact     Oral - Puree Weak lingual manipulation;Delayed oral  transit;Lingual/palatal residue;Reduced posterior propulsion     Oral - Regular Impaired mastication;Weak lingual manipulation;Reduced  posterior propulsion;Delayed oral transit;Lingual/palatal residue               CHL IP PHARYNGEAL PHASE 02/16/2015  Pharyngeal Phase Impaired               Pharyngeal - Honey Teaspoon Delayed swallow initiation;Premature spillage  to valleculae;Reduced laryngeal  elevation;Reduced tongue base  retraction;Pharyngeal residue - valleculae     Pharyngeal - Honey Cup Delayed swallow initiation;Premature spillage to  valleculae;Reduced laryngeal elevation;Reduced tongue base  retraction;Pharyngeal residue - valleculae;Penetration/Aspiration during  swallow;Reduced airway/laryngeal closure;Pharyngeal residue - posterior  pharnyx  Penetration/Aspiration details (honey cup) Material enters airway, remains  ABOVE vocal cords then ejected out        Pharyngeal - Nectar Teaspoon Premature spillage to valleculae;Reduced  laryngeal elevation;Reduced anterior laryngeal mobility;Reduced  airway/laryngeal closure;Reduced tongue base retraction;Pharyngeal residue  - valleculae;Pharyngeal residue - posterior pharnyx     Pharyngeal - Nectar Cup Delayed swallow initiation;Premature spillage to  valleculae;Premature spillage to pyriform sinuses;Reduced anterior  laryngeal mobility;Reduced laryngeal elevation;Reduced tongue base  retraction;Penetration/Aspiration during swallow;Pharyngeal residue -  valleculae;Pharyngeal residue - posterior pharnyx;Compensatory strategies  attempted (Comment)  Penetration/Aspiration details (nectar cup) Material enters airway,  remains ABOVE vocal cords and not ejected out                    Pharyngeal - Thin Teaspoon Premature spillage to valleculae;Delayed  swallow initiation;Reduced laryngeal elevation;Reduced tongue base  retraction;Reduced anterior laryngeal mobility     Pharyngeal - Thin Cup Delayed swallow initiation;Premature spillage to  valleculae;Premature spillage to pyriform sinuses;Penetration/Aspiration  during swallow;Reduced laryngeal elevation;Reduced anterior laryngeal  mobility;Reduced tongue base retraction;Compensatory strategies attempted  (Comment)  Penetration/Aspiration details (thin cup) Material enters airway, remains  ABOVE vocal cords and not ejected out  Pharyngeal - Thin Straw Delayed swallow initiation;Premature spillage to   pyriform sinuses;Reduced laryngeal elevation;Reduced tongue base  retraction;Penetration/Aspiration during swallow;Premature spillage to  valleculae;Reduced anterior laryngeal mobility;Compensatory strategies  attempted (Comment)  Penetration/Aspiration details (thin straw) Material enters airway,  remains ABOVE vocal cords and not ejected out                                            CHL IP CERVICAL ESOPHAGEAL PHASE 02/10/2015  Cervical Esophageal Phase [facility]  Page, Joni Reining L 02/16/2015, 4:10 PM    Scheduled: . chlorhexidine  15 mL Mouth Rinse BID  . [START ON 02/20/2015] darbepoetin (ARANESP) injection - DIALYSIS  150 mcg Intravenous Q Tue-HD  . feeding supplement (NEPRO CARB STEADY)  237 mL Oral BID BM  . ferrous sulfate  325 mg Oral BID WC  . folic acid  1 mg Oral Daily  . heparin  5,000 Units Subcutaneous 3 times per day  . hydroxychloroquine  200 mg Oral BID  . insulin aspart  0-9 Units Subcutaneous TID WC  . labetalol  250 mg Oral 3 times per day  . multivitamin  1 tablet Oral QHS  . mycophenolate  500 mg Oral BID  . neomycin-bacitracin-polymyxin   Topical TID  . pantoprazole sodium  40 mg Per Tube QHS  . thiamine  100 mg Oral Daily      LOS: 1 day   Anaiya Wisinski C 02/16/2015,5:26 PM

## 2015-02-16 NOTE — Progress Notes (Signed)
Speech Language Pathology Daily Session Note  Patient Details  Name: [name] [name] MRN: [id number] Date of Birth: 1961/05/18  Today's Date: 02/16/2015 SLP Individual Time: 1430-1500 SLP Individual Time Calculation (min): 30 min  Short Term Goals: Week 1: SLP Short Term Goal 1 (Week 1): STG=LTG due to ELOS  Skilled Therapeutic Interventions: Skilled treatment session focused on dysphagia goals. SLP facilitated session by providing skilled observation with patient's lunch meal of Dys. 3 textures with thin liquids. Patient is currently on a Dys. 2 diet, however, today's lunch meal was a fruit plate containing whole slices of canned peaches and pears.  Patient demonstrated efficient mastication with minimal oral residue and intermittent throat clearing and cough throughout the meal with both solids and liquids. However, per MBS completed today, patient had overt coughing in the absence of penetration and aspiration, therefore, recommend patient continue current diet with full supervision. Continue with current plan of care.    FIM:  Comprehension Comprehension Mode: Auditory Comprehension: 6-Follows complex conversation/direction: With extra time/assistive device Expression Expression Mode: Verbal Expression: 5-Expresses basic 90% of the time/requires cueing < 10% of the time. Social Interaction Social Interaction: 7-Interacts appropriately with others - No medications needed. Problem Solving Problem Solving Mode: Asleep Problem Solving: 6-Solves complex problems: With extra time Memory Memory: 6-More than reasonable amt of time FIM - Eating Eating Activity: 5: Supervision/cues  Pain Pain Assessment Pain Assessment: No/denies pain Pain Score: 0-No pain  Therapy/Group: Individual Therapy  Audriella Blakeley 02/16/2015, 4:35 PM

## 2015-02-16 NOTE — H&P (View-Only) (Signed)
Physical Medicine and Rehabilitation Admission H&P   Chief Complaint  Patient presents with  . Shortness of Breath  . Diarrhea  : HPI: [name] [name] is a 54 y.o. right handed female with history of remote right inferior brainstem hemorrhage and received inpatient rehabilitation services November 2015, hypertension, diabetes mellitus or peripheral neuropathy, chronic renal insufficiency with baseline creatinine 2.97, lupus. Independent with a cane prior to admission living with her-2 adult sons. Presented 01/18/2015 with vague complaints of shortness of breath, diarrhea, [place] and cough. Troponin mildly elevated 0.57. Chest x-ray showed no acute pulmonary process. Patient developed intermittent SVT with persistent tachycardia heart rate 150s became hypoxic requiring NRB and vent support and ultimately extubated 02/05/2015. Cranial CT scan negative. MRI of the brain again showed remote right inferior brainstem hemorrhage without acute findings.. Venous Doppler study negative. Echocardiogram with ejection fraction of 50% severe hypokinesis. EEG with no seizure activity but did show diffuse disturbance etiologically nonspecific suspect metabolic encephalopathy. Acute on chronic renal failure follow-up renal services hemodialysis initiated and underwent left basilic vein transposition fistula 02/14/2015 per Dr.Early .Marland Kitchen Currently nothing by mouth with nasogastric tube feeds. Subcutaneous heparin for DVT prophylaxis.WOC consulted 02/08/2015 for a stage II wound deep tissue injury to her nare/lip with a routine skin care. Patient with some loose stools C. difficile specimen pending currently on precautions. Physical and occupational therapy evaluation completed 02/06/2015 with recommendations of physical medicine rehabilitation consult. Patient was admitted for comprehensive rehabilitation program  Patient remembers me from clinic however did not remember seeing me during physical medicine and  rehabilitation consultation  Review of Systems  Cardiovascular: Positive for leg swelling.  Gastrointestinal: Positive for constipation.  Musculoskeletal: Positive for myalgias and joint pain.  Endo/Heme/Allergies: Positive for environmental allergies.  All other systems reviewed and are negative.      Past Medical History  Diagnosis Date  . Hypertension   . Lupus   . CKD (chronic kidney disease)     due to lupus/Dr. [name]  . Diabetes mellitus   . Stenosis of cervical spine region     with HNP at C5/6, C6/7  . Metatarsal bone fracture right    4th  . 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 Arthritis     RA  . Anemia   . Stroke 11/2014    left sided weakness, dysphagia  . Pain in joints   . Paresthesias   . Hyperlipidemia   . Colitis     ? per hospital notes 11/2014  . Lower GI bleed   . Hyponatremia   . Metabolic acidosis   . Hyperplastic colon polyp   . Hemorrhoids   . Blood transfusion without reported diagnosis    Past Surgical History  Procedure Laterality Date  . Breast biopsy    . Tubal ligation    . Insertion of dialysis catheter Right 02/05/2015    Procedure: INSERTION OF DIALYSIS CATHETER - RIGHT INTERNAL JUGULAR; Surgeon: Conrad Raymer, MD; Location: [facility]; Service: Vascular; Laterality: Right;   Family History  Problem Relation Age of Onset  . Hypertension    . Lupus    . Rheum arthritis    . Hypertension Mother   . Diabetes Mother   . Hypertension Sister   . Diabetes Father   . Hypertension Maternal Grandmother    Social History:  reports that she has quit smoking. Her smoking use included Cigarettes. She has a 15  pack-year smoking history. She has never used smokeless tobacco. She reports that she does not drink alcohol or use illicit drugs. Allergies:   Allergies  Allergen Reactions  . Sulfa Antibiotics Itching   Medications Prior to Admission  Medication Sig Dispense Refill  . acetaminophen (TYLENOL) 325 MG tablet Take 2 tablets (650 mg total) by mouth every 4 (four) hours as needed for mild pain.    Marland Kitchen ALPRAZolam (XANAX) 0.5 MG tablet Take 1 tablet (0.5 mg total) by mouth 2 (two) times daily as needed for anxiety. 15 tablet 0  . gabapentin (NEURONTIN) 100 MG capsule Take 1 capsule (100 mg total) by mouth at bedtime. (Patient taking differently: Take 100 mg by mouth 3 (three) times daily. ) 30 capsule 0  . labetalol (NORMODYNE) 300 MG tablet Take 2 tablets (600 mg total) by mouth 2 (two) times daily. 120 tablet 0  . mycophenolate (CELLCEPT) 250 MG capsule Take 4 capsules (1,000 mg total) by mouth 2 (two) times daily. 320 capsule 1  . senna-docusate (SENOKOT-S) 8.6-50 MG per tablet Take 1 tablet by mouth 2 (two) times daily. (Patient taking differently: Take 1 tablet by mouth 2 (two) times daily as needed. ) 60 tablet 1  . sodium bicarbonate 650 MG tablet Take 2 tablets (1,300 mg total) by mouth 2 (two) times daily. 120 tablet 1  . Vitamin D, Ergocalciferol, (DRISDOL) 50000 UNITS CAPS capsule Take 1 capsule (50,000 Units total) by mouth every Wednesday. 4 capsule 1  . atorvastatin (LIPITOR) 20 MG tablet Take 1 tablet (20 mg total) by mouth daily at 6 PM. 30 tablet 1  . cloNIDine (CATAPRES) 0.3 MG tablet Take 1 tablet (0.3 mg total) by mouth 3 (three) times daily. 90 tablet 1  . clotrimazole (MYCELEX) 10 MG troche Take 1 tablet (10 mg total) by mouth 5 (five) times daily. 30 tablet 0  . cyclobenzaprine (FLEXERIL) 5 MG tablet Take 5 mg by mouth 3 (three) times daily as needed for muscle spasms.     . diclofenac sodium (VOLTAREN) 1 % GEL Apply 2 g topically 4 (four) times daily. To forefoot 4 Tube 0  . ferrous sulfate 325 (65 FE) MG tablet Take 1 tablet (325 mg total) by  mouth daily with breakfast. 30 tablet 1  . furosemide (LASIX) 80 MG tablet Take 80 mg by mouth 2 (two) times daily.  0  . hydrALAZINE (APRESOLINE) 25 MG tablet Take 25 mg by mouth every 8 (eight) hours.  0  . hydroxychloroquine (PLAQUENIL) 200 MG tablet Take 2 tablets (400 mg total) by mouth daily. 30 tablet 1  . lidocaine (XYLOCAINE) 2 % solution     . Menthol-Methyl Salicylate (MUSCLE RUB) 10-15 % CREA Apply 1 application topically 3 (three) times daily before meals. 85 g 0  . multivitamin (RENA-VIT) TABS tablet Take 1 tablet by mouth daily.    Marland Kitchen nystatin-triamcinolone (MYCOLOG II) cream Apply topically 2 (two) times daily.  0  . nystatin-triamcinolone ointment (MYCOLOG) Apply 1 application topically 2 (two) times daily. 30 g 0  . pantoprazole (PROTONIX) 40 MG tablet Take 1 tablet (40 mg total) by mouth daily. 30 tablet 11  . pramoxine-hydrocortisone (PROCTOCREAM-HC) 1-1 % rectal cream Place 1 application rectally 2 (two) times daily. (Patient taking differently: Place 1 application rectally 2 (two) times daily as needed for hemorrhoids or itching. ) 30 g 1    Home: Home Living Family/patient expects to be discharged to:: Private residence Living Arrangements: Children Available Help at Discharge: Available 24 hours/day, Family  Type of Home: Apartment Home Access: Stairs to enter [employer] of Steps: 4 Entrance Stairs-Rails: Right Home Layout: Two level, Able to live on main level with bedroom/bathroom Alternate Level Stairs-Number of Steps: flight Home Equipment: Environmental consultant - 2 wheels, Cane - single point, Shower seat, Bedside commode Additional Comments: Pt was previously receiving HHOT and PT  Functional History: Prior Function Level of Independence: Independent with assistive device(s) Comments: Pt reports on her bad days she uses the cane or the RW.   Functional Status:  Mobility: Bed Mobility Overal bed  mobility: Needs Assistance Bed Mobility: Supine to Sit, Sit to Supine Supine to sit: Supervision Sit to supine: Supervision General bed mobility comments: no physical assist, use of rail Transfers Overall transfer level: Needs assistance Equipment used: Rolling walker (2 wheeled) Transfers: Sit to/from Stand Sit to Stand: Min guard General transfer comment: min guard for safety, good technique Ambulation/Gait Ambulation/Gait assistance: Min guard Ambulation Distance (Feet): 100 Feet Assistive device: Rolling walker (2 wheeled) Gait Pattern/deviations: Step-through pattern, Decreased stance time - left, Decreased step length - right Gait velocity: Decreased Gait velocity interpretation: Below normal speed for age/gender General Gait Details: Pt used 2wRW well with minimal verbal cueing and appeared to bear weight evenly between UE's and LE's. No LOB during gait, but stayed within center of mass throughout.    ADL: ADL Overall ADL's : Needs assistance/impaired Eating/Feeding: NPO Grooming: Wash/dry hands, Standing, Supervision/safety Upper Body Bathing: Set up, Supervision/ safety, Sitting Lower Body Bathing: Minimal assistance (with Mod A sit<>stand) Upper Body Dressing : Set up, Standing Lower Body Dressing: Minimal assistance (with Mod A sit<>stand) Toilet Transfer: Min guard, RW, Ambulation, BSC (over toilet) Toileting- Clothing Manipulation and Hygiene: Moderate assistance, Sit to/from stand Toileting - Clothing Manipulation Details (indicate cue type and reason): assist to clean up after BM Functional mobility during ADLs: Min guard, Rolling walker General ADL Comments: Patient completed UB/LB bathing in sit<>stand position from recliner. Patient required min verbal cues for safety with use of RW and therapist reiterated importance of using RW during all transfers for safety at this time. Educated patient and NT on importance of patient ambulating <> BR for toilet transfers and  toielting needs to help patient regain her strength, endurance, and independence.   Cognition: Cognition Overall Cognitive Status: Within Functional Limits for tasks assessed Orientation Level: Oriented X4 Cognition Arousal/Alertness: Awake/alert Behavior During Therapy: WFL for tasks assessed/performed Overall Cognitive Status: Within Functional Limits for tasks assessed  Physical Exam: Blood pressure 133/71, pulse 85, temperature 98.6 F (37 C), temperature source Oral, resp. rate 16, height _0  (1.651 m), weight 54.1 kg (119 lb 4.3 oz), last menstrual period 07/18/2012, SpO2 98 %. Physical Exam  Vitals reviewed. Constitutional: She is oriented to person, place, and time. She appears well-developed.  HENT:  Head: Normocephalic.  Eyes: EOM are normal.  Neck: Normal range of motion. Neck supple. No thyromegaly present.  Cardiovascular: Normal rate and regular rhythm.  Respiratory: Effort normal and breath sounds normal. No respiratory distress.  GI: Soft. Bowel sounds are normal. She exhibits no distension.  Neurological: She is alert and oriented to person, place, and time.  Follows commands  Skin:  Fistula site clean and dry   4 / 5 bilateral deltoid bicep tricep and grip 4/5 right hip flexors, knee extension, ADLs, 3+ hip flexors, ADLs   Lab Results Last 48 Hours    Results for orders placed or performed during the hospital encounter of 01/18/15 (from the past 48 hour(s))  Glucose,  capillary Status: Abnormal   Collection Time: 02/12/15 11:51 AM  Result Value Ref Range   Glucose-Capillary 121 (H) 70 - 99 mg/dL  Glucose, capillary Status: Abnormal   Collection Time: 02/12/15 4:19 PM  Result Value Ref Range   Glucose-Capillary 127 (H) 70 - 99 mg/dL  Glucose, capillary Status: Abnormal   Collection Time: 02/12/15 8:14 PM  Result Value Ref Range   Glucose-Capillary 101 (H) 70 - 99 mg/dL  Glucose, capillary Status: None    Collection Time: 02/12/15 11:52 PM  Result Value Ref Range   Glucose-Capillary 97 70 - 99 mg/dL  Glucose, capillary Status: Abnormal   Collection Time: 02/13/15 4:20 AM  Result Value Ref Range   Glucose-Capillary 102 (H) 70 - 99 mg/dL  CBC Status: Abnormal   Collection Time: 02/13/15 5:00 AM  Result Value Ref Range   WBC 6.3 4.0 - 10.5 K/uL   RBC 2.77 (L) 3.87 - 5.11 MIL/uL   Hemoglobin 7.8 (L) 12.0 - 15.0 g/dL   HCT 24.6 (L) 36.0 - 46.0 %   MCV 88.8 78.0 - 100.0 fL   MCH 28.2 26.0 - 34.0 pg   MCHC 31.7 30.0 - 36.0 g/dL   RDW 18.5 (H) 11.5 - 15.5 %   Platelets 283 150 - 400 K/uL  Glucose, capillary Status: Abnormal   Collection Time: 02/13/15 7:30 AM  Result Value Ref Range   Glucose-Capillary 120 (H) 70 - 99 mg/dL  Glucose, capillary Status: None   Collection Time: 02/13/15 1:44 PM  Result Value Ref Range   Glucose-Capillary 98 70 - 99 mg/dL  Renal function panel Status: Abnormal   Collection Time: 02/13/15 4:00 PM  Result Value Ref Range   Sodium 137 135 - 145 mmol/L   Potassium 4.2 3.5 - 5.1 mmol/L   Chloride 100 96 - 112 mmol/L   CO2 29 19 - 32 mmol/L   Glucose, Bld 126 (H) 70 - 99 mg/dL   BUN 12 6 - 23 mg/dL   Creatinine, Ser 1.90 (H) 0.50 - 1.10 mg/dL   Calcium 9.1 8.4 - 10.5 mg/dL   Phosphorus 2.2 (L) 2.3 - 4.6 mg/dL   Albumin 2.5 (L) 3.5 - 5.2 g/dL   GFR calc non Af Amer 29 (L) >90 mL/min   GFR calc Af Amer 34 (L) >90 mL/min    Comment: (NOTE) The eGFR has been calculated using the CKD EPI equation. This calculation has not been validated in all clinical situations. eGFR's persistently <90 mL/min signify possible Chronic Kidney Disease.    Anion gap 8 5 - 15  Glucose, capillary Status: Abnormal   Collection Time: 02/13/15 4:02 PM  Result Value Ref Range   Glucose-Capillary 126  (H) 70 - 99 mg/dL  Glucose, capillary Status: Abnormal   Collection Time: 02/13/15 8:38 PM  Result Value Ref Range   Glucose-Capillary 100 (H) 70 - 99 mg/dL  Glucose, capillary Status: Abnormal   Collection Time: 02/14/15 12:05 AM  Result Value Ref Range   Glucose-Capillary 100 (H) 70 - 99 mg/dL  Surgical pcr screen Status: None   Collection Time: 02/14/15 12:21 AM  Result Value Ref Range   MRSA, PCR NEGATIVE NEGATIVE   Staphylococcus aureus NEGATIVE NEGATIVE    Comment:   The Xpert SA Assay (FDA approved for NASAL specimens in patients over 88 years of age), is one component of a comprehensive surveillance program. Test performance has been validated by [facility] for patients greater than or equal to 35 year old. It is not intended  to diagnose infection nor to guide or monitor treatment.   Glucose, capillary Status: None   Collection Time: 02/14/15 4:42 AM  Result Value Ref Range   Glucose-Capillary 87 70 - 99 mg/dL  Renal function panel (daily at 0500) Status: Abnormal   Collection Time: 02/14/15 5:25 AM  Result Value Ref Range   Sodium 138 135 - 145 mmol/L   Potassium 3.9 3.5 - 5.1 mmol/L   Chloride 99 96 - 112 mmol/L   CO2 30 19 - 32 mmol/L   Glucose, Bld 89 70 - 99 mg/dL   BUN 24 (H) 6 - 23 mg/dL    Comment: DELTA CHECK NOTED   Creatinine, Ser 3.08 (H) 0.50 - 1.10 mg/dL    Comment: DELTA CHECK NOTED   Calcium 9.6 8.4 - 10.5 mg/dL   Phosphorus 4.2 2.3 - 4.6 mg/dL   Albumin 2.6 (L) 3.5 - 5.2 g/dL   GFR calc non Af Amer 16 (L) >90 mL/min   GFR calc Af Amer 19 (L) >90 mL/min    Comment: (NOTE) The eGFR has been calculated using the CKD EPI equation. This calculation has not been validated in all clinical situations. eGFR's persistently <90 mL/min signify possible Chronic Kidney Disease.    Anion gap 9 5 - 15   CBC Status: Abnormal   Collection Time: 02/14/15 5:25 AM  Result Value Ref Range   WBC 5.5 4.0 - 10.5 K/uL   RBC 2.82 (L) 3.87 - 5.11 MIL/uL   Hemoglobin 7.8 (L) 12.0 - 15.0 g/dL   HCT 26.2 (L) 36.0 - 46.0 %   MCV 92.9 78.0 - 100.0 fL   MCH 27.7 26.0 - 34.0 pg   MCHC 29.8 (L) 30.0 - 36.0 g/dL   RDW 18.8 (H) 11.5 - 15.5 %   Platelets 266 150 - 400 K/uL  Basic metabolic panel Status: Abnormal   Collection Time: 02/14/15 5:25 AM  Result Value Ref Range   Sodium 137 135 - 145 mmol/L   Potassium 4.0 3.5 - 5.1 mmol/L   Chloride 99 96 - 112 mmol/L   CO2 30 19 - 32 mmol/L   Glucose, Bld 89 70 - 99 mg/dL   BUN 24 (H) 6 - 23 mg/dL   Creatinine, Ser 3.07 (H) 0.50 - 1.10 mg/dL   Calcium 9.6 8.4 - 10.5 mg/dL   GFR calc non Af Amer 16 (L) >90 mL/min   GFR calc Af Amer 19 (L) >90 mL/min    Comment: (NOTE) The eGFR has been calculated using the CKD EPI equation. This calculation has not been validated in all clinical situations. eGFR's persistently <90 mL/min signify possible Chronic Kidney Disease.    Anion gap 8 5 - 15  Glucose, capillary Status: None   Collection Time: 02/14/15 8:03 AM  Result Value Ref Range   Glucose-Capillary 76 70 - 99 mg/dL   Comment 1 Notify RN       Imaging Results (Last 48 hours)    Dg Abd Portable 1v  02/12/2015 CLINICAL DATA: NGT placement. EXAM: PORTABLE ABDOMEN - 1 VIEW COMPARISON: None. FINDINGS: Tip of the weighted enteric tube is within the stomach. This is directed towards the pylorus. Mild gaseous gastric distention. Nonobstructive bowel gas pattern. IMPRESSION: Tip of the weighted enteric tube within the stomach, directed towards the pylorus. Electronically Signed By: Jeb Levering M.D. On: 02/12/2015 21:49        Medical Problem List and Plan: 1. Functional deficits secondary to deconditioning secondary  to respiratory failure/history brainstem infarct right  paramedian pontine 2. DVT Prophylaxis/Anticoagulation: Subcutaneous heparin. Monitor platelet counts and any signs of bleeding. Venous Doppler is negative 3. Pain Management: Tylenol as needed 4. Dysphagia. Nasogastric tube feeds. Follow-up speech therapy 5. Neuropsych: This patient is capable of making decisions on her own behalf. 6. Skin/Wound Care: Skin care as directed 7. Fluids/Electrolytes/Nutrition: Strict I&O's with follow-up chemistries 8. End-stage renal disease/SLE. Continue dialysis as per renal services 9.Hypertension/SVT. Labetalol 250 mg 3 times a day. Monitor with increased mobility 10. Acute on Chronic anemia. Follow up cbc.Aranesp weekly 11.Lupus.Cellcept/Plaquenil 12. C. difficile pending. Patient currently on contact precautions. Follow-up stool specimen  Post Admission Physician Evaluation: 1. Functional deficits secondary to Deconditioning secondary to respiratory failure as well as acute on chronic renal failure in a patient with prior pontine infarct. 2. Patient is admitted to receive collaborative, interdisciplinary care between the physiatrist, rehab nursing staff, and therapy team. 3. Patient's level of medical complexity and substantial therapy needs in context of that medical necessity cannot be provided at a lesser intensity of care such as a SNF. 4. Patient has experienced substantial functional loss from his/her baseline which was documented above under the "Functional History" and "Functional Status" headings. Judging by the patient's diagnosis, physical exam, and functional history, the patient has potential for functional progress which will result in measurable gains while on inpatient rehab. These gains will be of substantial and practical use upon discharge in facilitating mobility and self-care at the household level. 5. Physiatrist will provide 24 hour management of medical needs as well as oversight  of the therapy plan/treatment and provide guidance as appropriate regarding the interaction of the two. 6. 24 hour rehab nursing will assist with bladder management, bowel management, safety, skin/wound care, disease management, medication administration, pain management and patient education and help integrate therapy concepts, techniques,education, etc. 7. PT will assess and treat for/with: pre gait, gait training, endurance , safety, equipment, neuromuscular re education. Goals are: Supervision mobility. 8. OT will assess and treat for/with: ADLs, Cognitive perceptual skills, Neuromuscular re education, safety, endurance, equipment. Goals are: Supervision and ADLs. Therapy May proceed with showering this patient. 9. SLP will assess and treat for/with: Dysphagia. Goals are: Safe and adequate by mouth intake of solids and liquids. 10. Case Management and Social Worker will assess and treat for psychological issues and discharge planning. 11. Team conference will be held weekly to assess progress toward goals and to determine barriers to discharge. 12. Patient will receive at least 3 hours of therapy per day at least 5 days per week. 13. ELOS: 12-16 days  14. Prognosis: good     [name] M.D. [facility] FAAPM&R (Sports Med, Neuromuscular Med) Diplomate Am Board of Electrodiagnostic Med  02/14/2015

## 2015-02-16 NOTE — Care Management Note (Signed)
[facility] Individual Statement of Services  Patient Name:  [name] [name]  Date:  02/16/2015  Welcome to the [facility].  Our goal is to provide you with an individualized program based on your diagnosis and situation, designed to meet your specific needs.  With this comprehensive rehabilitation program, you will be expected to participate in at least 3 hours of rehabilitation therapies Monday-Friday, with modified therapy programming on the weekends.  Your rehabilitation program will include the following services:  Physical Therapy (PT), Occupational Therapy (OT), Speech Therapy (ST), 24 hour per day rehabilitation nursing, Therapeutic Recreaction (TR), Neuropsychology, Case Management (Social Worker), Rehabilitation Medicine, Nutrition Services and Pharmacy Services  Weekly team conferences will be held on Wednesday to discuss your progress.  Your Social Worker will talk with you frequently to get your input and to update you on team discussions.  Team conferences with you and your family in attendance may also be held.  Expected length of stay: 5-7 days  Overall anticipated outcome: mod/i to supervision level  Depending on your progress and recovery, your program may change. Your Social Worker will coordinate services and will keep you informed of any changes. Your Social Worker's name and contact numbers are listed  below.  The following services may also be recommended but are not provided by the [facility]:    [place] will be made to provide these services after discharge if needed.  Arrangements include referral to agencies that provide these services.  Your insurance has been verified to be:  [place] Your primary doctor is:  Silvestre Mesi  Pertinent information will be shared with your doctor and your insurance company.  Social Worker:   Ovidio Kin, [place] or [phone]  Information discussed with and copy given to patient by: Elease Hashimoto, 02/16/2015, 11:00 AM

## 2015-02-16 NOTE — Plan of Care (Signed)
Problem: RH BOWEL ELIMINATION Goal: RH STG MANAGE BOWEL WITH ASSISTANCE STG Manage Bowel with Mod I Assistance.  Outcome: Not Progressing Incontinent of bowel x 1

## 2015-02-16 NOTE — Progress Notes (Signed)
INITIAL NUTRITION ASSESSMENT  Pt meets criteria for SEVERE MALNUTRITION in the context of acute illness/injury as evidenced by a 19% weight loss in 3 months and moderate muscle mass loss.  DOCUMENTATION CODES Per approved criteria  -Severe malnutrition in the context of acute illness or injury   INTERVENTION: Tube feeding has been discontinued.  Provide Nepro Shake po BID, each supplement provides 425 kcal and 19 grams protein.  Encourage adequate PO intake.  NUTRITION DIAGNOSIS: Increased nutrient needs related to chronic illness, ESRD as evidenced by estimated nutrition needs.  Goal: Pt to meet >/= 90% of their estimated nutrition needs   Monitor:  PO intake, weight trends, labs, I/O's  Reason for Assessment: MD consult for enter/tube feeding initiation and management  54 y.o. female  Admitting Dx: <principal problem not specified>  ASSESSMENT: Pt with history of remote right inferior brainstem hemorrhage, hypertension, diabetes mellitus or peripheral neuropathy, chronic renal insufficiency, lupus. MRI of the brain again showed remote right inferior brainstem hemorrhage without acute findings. Pt with new ESRD on HD.  Pt had MBS evaluation today. Diet has been advanced to a dysphagia 2 with thin liquids. NGT out and tube feedings has been discontinued. Pt reports she is hungry and ready to eat. Noted per Epic weight records, pt with a 19% weight loss in 3 months. Pt does report having a decreased appetite at home PTA and usually drinks Boost twice daily. Pt is agreeable on trying Nepro Shake. RD to order. Pt was encouraged to eat her food at meals.   RD to continue to monitor.   Nutrition Focused Physical Exam:  Subcutaneous Fat: Unable to assess fat mass during time of visit Orbital Region: N/A Upper Arm Region: N/A Thoracic and Lumbar Region: N/A  Muscle:  Temple Region: N/A Clavicle Bone Region: Moderate depletion Clavicle and Acromion Bone Region: Moderate  depletion Scapular Bone Region: N/A Dorsal Hand: N/A Patellar Region: Moderate depletion Anterior Thigh Region: Moderate depletion Posterior Calf Region: Mild depletion  Edema: none  Labs and medications reviewed.  Height: Ht Readings from Last 1 Encounters:  02/07/15 5\' 5"  (1.651 m)    Weight: Wt Readings from Last 1 Encounters:  02/16/15 120 lb 5.9 oz (54.6 kg)    Ideal Body Weight: 125 lbs  % Ideal Body Weight: 96%  Wt Readings from Last 10 Encounters:  02/16/15 120 lb 5.9 oz (54.6 kg)  02/15/15 120 lb 2.4 oz (54.5 kg)  12/07/14 127 lb (57.607 kg)  11/25/14 142 lb 3.2 oz (64.5 kg)  11/10/14 131 lb (59.421 kg)  11/06/14 149 lb (67.586 kg)  10/25/14 149 lb (67.586 kg)  10/17/14 156 lb 14.4 oz (71.169 kg)  10/05/14 146 lb 9.7 oz (66.5 kg)  09/28/14 142 lb 6.7 oz (64.6 kg)    Usual Body Weight: 149 lbs  % Usual Body Weight: 81%  BMI:  Body mass index is 20.03 kg/(m^2).  Estimated Nutritional Needs: Kcal: 1650-1900 Protein: 85-100 grams Fluid: Per MD  Skin: Stage II pressure ulcer on ischial, buttocks, sacrum, upper lip, incision on R neck, and L arm  Diet Order: DIET DYS 2 Room service appropriate?: Yes; Fluid consistency:: Thin  EDUCATION NEEDS: -No education needs identified at this time   Intake/Output Summary (Last 24 hours) at 02/16/15 1312 Last data filed at 02/15/15 2000  Gross per 24 hour  Intake    100 ml  Output      0 ml  Net    100 ml    Last BM: 4/14  Labs:   Recent Labs Lab 02/13/15 1600 02/14/15 0525 02/15/15 0705  NA 137 137  138 137  K 4.2 4.0  3.9 3.8  CL 100 99  99 96  CO2 29 30  30 28   BUN 12 24*  24* 32*  CREATININE 1.90* 3.07*  3.08* 4.59*  CALCIUM 9.1 9.6  9.6 9.5  PHOS 2.2* 4.2 5.4*  GLUCOSE 126* 89  89 111*    CBG (last 3)   Recent Labs  02/16/15 0419 02/16/15 0759 02/16/15 1144  GLUCAP 100* 97 104*    Scheduled Meds: . chlorhexidine  15 mL Mouth Rinse BID  . [START ON 02/20/2015]  darbepoetin (ARANESP) injection - DIALYSIS  150 mcg Intravenous Q Tue-HD  . ferrous sulfate  325 mg Oral BID WC  . folic acid  1 mg Oral Daily  . free water  100 mL Per Tube TID  . heparin  5,000 Units Subcutaneous 3 times per day  . hydroxychloroquine  200 mg Oral BID  . labetalol  250 mg Oral 3 times per day  . multivitamin  1 tablet Oral QHS  . mycophenolate  500 mg Oral BID  . neomycin-bacitracin-polymyxin   Topical TID  . pantoprazole sodium  40 mg Per Tube QHS  . thiamine  100 mg Oral Daily    Continuous Infusions:    Past Medical History  Diagnosis Date  . Hypertension   . Lupus   . CKD (chronic kidney disease)     due to lupus/Dr. [name]  . Diabetes mellitus   . Stenosis of cervical spine region     with HNP at C5/6, C6/7  . Metatarsal bone fracture right    4th  . 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 Arthritis     RA  . Anemia   . Stroke 11/2014    left sided weakness, dysphagia  . Pain in joints   . Paresthesias   . Hyperlipidemia   . Colitis     ? per hospital notes 11/2014  . Lower GI bleed   . Hyponatremia   . Metabolic acidosis   . Hyperplastic colon polyp   . Hemorrhoids   . Blood transfusion without reported diagnosis     Past Surgical History  Procedure Laterality Date  . Breast biopsy    . Tubal ligation    . Insertion of dialysis catheter Right 02/05/2015    Procedure: INSERTION OF DIALYSIS CATHETER - RIGHT INTERNAL JUGULAR;  Surgeon: Conrad Franktown, MD;  Location: [place];  Service: Vascular;  Laterality: Right;  . Bascilic vein transposition Left 02/14/2015    Procedure: BASILIC VEIN Fistula Creation First Stage;  Surgeon: Rosetta Posner, MD;  Location: [place];  Service: Vascular;  Laterality: Left;    Kallie Locks, MS, RD, LDN Pager # [phone] After hours/ weekend pager # [phone]

## 2015-02-16 NOTE — Evaluation (Signed)
Physical Therapy Assessment and Plan  Patient Details  Name: [name] [name] MRN: [id number] Date of Birth: Nov 29, 1960  PT Diagnosis: Difficulty walking and Muscle weakness (generalized) Rehab Potential: Excellent ELOS: 5 days    Today's Date: 02/16/2015 PT Individual Time: 4481-8563 PT Individual Time Calculation (min): 57 min    Problem List:  Patient Active Problem List   Diagnosis Date Noted  . Debilitated 02/15/2015  . Acute respiratory failure   . SVT (supraventricular tachycardia)   . Other specified hypotension   . Acute diastolic CHF (congestive heart failure)   . Protein-calorie malnutrition   . Debility 02/07/2015  . Encephalopathy acute   . Atelectasis, left   . Confusion   . Acute respiratory failure with hypoxia 01/22/2015  . ARDS (adult respiratory distress syndrome) 01/22/2015  . Tachycardia   . Weakness 01/19/2015  . Elevated troponin level 01/19/2015  . Hypotension 01/19/2015  . Absolute anemia   . Shortness of breath 01/18/2015  . Diarrhea 12/07/2014  . Hemorrhoid 12/07/2014  . HCAP (healthcare-associated pneumonia) 11/23/2014  . Fever   . Bleeding gastrointestinal   . Tremor 11/21/2014  . Protein-calorie malnutrition, severe 11/20/2014  . GIB (gastrointestinal bleeding) 11/19/2014  . CKD (chronic kidney disease) stage 4, GFR 15-29 ml/min 11/19/2014  . Abdominal pain 11/19/2014  . Noninfectious gastroenteritis and colitis 11/19/2014  . Melena   . Acute blood loss anemia   . Acute on chronic renal failure   . Hyponatremia   . Hyperglycemia   . MCTD (mixed connective tissue disease)   . SLE (systemic lupus erythematosus)   . Secondary cardiomyopathy   . Noncompliance with medication regimen   . Tobacco abuse   . Sepsis 10/10/2014  . Leucocytosis 10/10/2014  . Fluid overload 10/10/2014  . Chronic kidney disease 10/10/2014  . SIRS (systemic inflammatory response syndrome) 10/10/2014  . Acute renal failure superimposed on stage 4 chronic kidney  disease 10/10/2014  . Anemia 10/10/2014  . Hyperkalemia 10/10/2014  . Acute encephalopathy 10/10/2014  . Membranous glomerulonephritis   . Torticollis, acquired 10/06/2014  . Essential hypertension 10/05/2014  . Acute respiratory acidosis   . ICH (intracerebral hemorrhage)   . Chronic ankle pain   . Cerebral hemorrhage   . Malignant hypertension   . History of stroke 09/28/2014  . 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 Hypertensive emergency 09/28/2014  . Iron deficiency anemia 02/18/2008  . 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 History of adrenal insufficiency 02/18/2008  . DIABETES MELLITUS, TYPE II 10/25/2007  . HYPERLIPIDEMIA 10/25/2007  . UNSPECIFIED ULCERATION OF VULVA 10/25/2007  . Systemic lupus erythematosus 10/25/2007  . Rheumatoid arthritis 10/25/2007  . HYPERTENSION NEC 10/25/2007  . COLONIC POLYPS, HYPERPLASTIC 04/08/2006  . HEMORRHOIDS 04/08/2006    Past Medical History:  Past Medical History  Diagnosis Date  . Hypertension   . Lupus   . CKD (chronic kidney disease)     due to lupus/Dr. [name]  . Diabetes mellitus   . Stenosis of cervical spine region     with HNP at C5/6, C6/7  . Metatarsal bone fracture right    4th  . 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 Arthritis     RA  . Anemia   . Stroke 11/2014    left sided weakness, dysphagia  . Pain in joints   . Paresthesias   . Hyperlipidemia   . Colitis     ? per hospital  notes 11/2014  . Lower GI bleed   . Hyponatremia   . Metabolic acidosis   . Hyperplastic colon polyp   . Hemorrhoids   . Blood transfusion without reported diagnosis    Past Surgical History:  Past Surgical History  Procedure Laterality Date  . Breast biopsy    . Tubal ligation    . Insertion of dialysis catheter Right 02/05/2015    Procedure: INSERTION OF DIALYSIS CATHETER - RIGHT INTERNAL JUGULAR;  Surgeon: Conrad [place], MD;  Location: [place];  Service:  Vascular;  Laterality: Right;  . Bascilic vein transposition Left 02/14/2015    Procedure: BASILIC VEIN Fistula Creation First Stage;  Surgeon: Rosetta Posner, MD;  Location: [facility] OR;  Service: Vascular;  Laterality: Left;    Assessment & Plan Clinical Impression: Patient is a 54 y.o. year old female with history of remote right inferior brainstem hemorrhage and received inpatient rehabilitation services November 2015, hypertension, diabetes mellitus or peripheral neuropathy, chronic renal insufficiency with baseline creatinine 2.97, lupus. Independent with a cane prior to admission living with her-2 adult sons. Presented 01/18/2015 with vague complaints of shortness of breath, diarrhea, [place] and cough. Troponin mildly elevated 0.57. Chest x-ray showed no acute pulmonary process. Patient developed intermittent SVT with persistent tachycardia heart rate 150s became hypoxic requiring NRB and vent support and ultimately extubated 02/05/2015. Cranial CT scan negative. MRI of the brain again showed remote right inferior brainstem hemorrhage without acute findings.. Venous Doppler study negative. Echocardiogram with ejection fraction of 50% severe hypokinesis. EEG with no seizure activity but did show diffuse disturbance etiologically nonspecific suspect metabolic encephalopathy. Acute on chronic renal failure follow-up renal services hemodialysis initiated and underwent left basilic vein transposition fistula 02/14/2015 per Dr.Early .Marland Kitchen Currently nothing by mouth with nasogastric tube feeds.  Patient transferred to CIR on 02/15/2015 .   Patient currently requires min to close S with mobility secondary to muscle weakness.  Prior to hospitalization, patient was modified independent  with mobility and lived with Family, Son, Daughter in a [place] home.  Home access is 4Stairs to enter.  Patient will benefit from skilled PT intervention to maximize safe functional mobility, minimize fall risk and decrease caregiver  burden for planned discharge home with intermittent assist.  Anticipate patient will benefit from follow up OP at discharge.  PT - End of Session Activity Tolerance: Endurance does not limit participation in activity Endurance Deficit: No PT Assessment Rehab Potential (ACUTE/IP ONLY): Excellent PT Patient demonstrates impairments in the following area(s): Balance;Motor;Safety PT Transfers Functional Problem(s): Bed Mobility;Bed to Chair;Car;Furniture;Floor PT Locomotion Functional Problem(s): Ambulation;Stairs PT Plan PT Intensity: Minimum of 1-2 x/day ,45 to 90 minutes PT Frequency: 5 out of 7 days PT Duration Estimated Length of Stay: 5 days  PT Treatment/Interventions: Ambulation/gait training;Balance/vestibular training;Community reintegration;Discharge planning;Disease management/prevention;DME/adaptive equipment instruction;Functional mobility training;Neuromuscular re-education;Patient/family education;Psychosocial support;Stair training;Therapeutic Activities;Therapeutic Exercise;UE/LE Strength taining/ROM PT Transfers Anticipated Outcome(s): mod I  PT Locomotion Anticipated Outcome(s): mod I PT Recommendation Follow Up Recommendations: Outpatient PT Patient destination: Home Equipment Recommended: None recommended by PT  Skilled Therapeutic Intervention PT assessment and evaluation completed, see full details below.  Initiated gait training with use of RW at S level.  Also initiated stair training to better simulate home entry.  Performed at close S to min/guard level.  Performed BERG balance test with score of 37/56 indicative of significant fall risk.  Also performed TUG with avg score of 20.6 secs, also indicative of high fall risk.  Discussed this with pt, verbalized understanding.  Discussed ELOS, expected outcomes, rehab schedule.  Pt verbalized understanding.  Pt ambulated back to room and left in recliner in room with all needs in reach.    PT  Evaluation Precautions/Restrictions Precautions Precautions: Fall Precaution Comments: NG tube Restrictions Weight Bearing Restrictions: No General Chart Reviewed: Yes Family/Caregiver Present: No Vital Signs  Pain Pain Assessment Pain Assessment: No/denies pain Pain Score: 0-No pain Home Living/Prior Functioning Home Living Available Help at Discharge: Family;Available 24 hours/day Type of Home: Apartment Home Access: Stairs to enter Entrance Stairs-Number of Steps: 4 Entrance Stairs-Rails: Right Home Layout: Two level;Able to live on main level with bedroom/bathroom Additional Comments: Pt was previously receiving HHOT and PT  Lives With: Family;Son;Daughter Prior Function Level of Independence: Needs assistance with gait  Able to Take Stairs?: Yes Driving: No Vocation: On disability Leisure: Hobbies-yes (Comment) Comments: plays with grandchildren Vision/Perception   See OT note Cognition Overall Cognitive Status: Within Functional Limits for tasks assessed Orientation Level: Oriented X4 Attention: Selective;Alternating Selective Attention: Appears intact Alternating Attention: Appears intact Memory: Appears intact Awareness: Appears intact Safety/Judgment: Appears intact Sensation Sensation Light Touch: Appears Intact Stereognosis: Not tested Hot/Cold: Not tested Proprioception: Appears Intact Coordination Gross Motor Movements are Fluid and Coordinated: Yes (LEs) Fine Motor Movements are Fluid and Coordinated: Yes (LEs) Heel Shin Test: [facility] Motor  Motor Motor: Other (comment) Motor - Skilled Clinical Observations: General deconditioning, decreased balance  Mobility Bed Mobility Bed Mobility: Supine to Sit;Sit to Supine Supine to Sit: 5: Supervision Supine to Sit Details: Verbal cues for sequencing;Verbal cues for technique;Verbal cues for precautions/safety Sit to Supine: 5: Supervision Sit to Supine - Details: Verbal cues for sequencing;Verbal cues  for technique;Verbal cues for precautions/safety Transfers Transfers: Yes Sit to Stand: 5: Supervision Sit to Stand Details: Verbal cues for technique;Verbal cues for sequencing;Verbal cues for precautions/safety Stand to Sit: 5: Supervision Stand to Sit Details (indicate cue type and reason): Verbal cues for sequencing;Verbal cues for technique;Verbal cues for precautions/safety Stand Pivot Transfers: 5: Supervision Stand Pivot Transfer Details: Verbal cues for sequencing;Verbal cues for technique;Verbal cues for precautions/safety Locomotion  Ambulation Ambulation: Yes Ambulation/Gait Assistance: 5: Supervision;4: Min assist (Min A w/O RW, S w/ RW) Ambulation Distance (Feet): 100 Feet ( x 2 then another 175') Assistive device: Rolling walker;1 person hand held assist Gait Gait: Yes Gait Pattern: Impaired Gait Pattern: Decreased stance time - left;Decreased step length - right;Decreased stride length;Trunk flexed Stairs / Additional Locomotion Stairs: Yes Stairs Assistance: 4: Min guard Stair Management Technique: One rail Right;Step to pattern;Forwards Number of Stairs: 4 Height of Stairs: 6 Architect: Yes Wheelchair Assistance: 5: Investment banker, operational Details: Verbal cues for sequencing;Verbal cues for technique;Verbal cues for Information systems manager: Both upper extremities Wheelchair Parts Management: Needs assistance Distance: 150  Trunk/Postural Assessment  Cervical Assessment Cervical Assessment: Within Functional Limits Thoracic Assessment Thoracic Assessment: Within Functional Limits Lumbar Assessment Lumbar Assessment: Within Functional Limits Postural Control Postural Control: Deficits on evaluation Protective Responses: Pt with decreased/delayed stepping and hip strategy during session.   Balance Balance Balance Assessed: Yes Standardized Balance Assessment Standardized Balance Assessment: Berg  Balance Test Berg Balance Test Sit to Stand: Able to stand  independently using hands Standing Unsupported: Able to stand 2 minutes with supervision Sitting with Back Unsupported but Feet Supported on Floor or Stool: Able to sit safely and securely 2 minutes Stand to Sit: Controls descent by using hands Transfers: Able to transfer safely, definite need of hands Standing Unsupported with Eyes Closed: Able to stand 10  seconds with supervision Standing Ubsupported with Feet Together: Able to place feet together independently and stand for 1 minute with supervision From Standing, Reach Forward with Outstretched Arm: Can reach forward >12 cm safely (5") From Standing Position, Pick up Object from Floor: Able to pick up shoe, needs supervision From Standing Position, Turn to Look Behind Over each Shoulder: Looks behind from both sides and weight shifts well Turn 360 Degrees: Needs assistance while turning Standing Unsupported, Alternately Place Feet on Step/Stool: Able to complete 4 steps without aid or supervision Standing Unsupported, One Foot in Front: Able to plae foot ahead of the other independently and hold 30 seconds Standing on One Leg: Unable to try or needs assist to prevent fall Total Score: 37 Dynamic Sitting Balance Dynamic Sitting - Balance Support: Feet supported Dynamic Sitting - Level of Assistance: 5: Stand by assistance Static Standing Balance Static Standing - Balance Support: During functional activity Static Standing - Level of Assistance: 4: Min assist;5: Stand by assistance Dynamic Standing Balance Dynamic Standing - Balance Support: During functional activity Dynamic Standing - Level of Assistance: 5: Stand by assistance;4: Min assist Extremity Assessment      RLE Assessment RLE Assessment: Within Functional Limits (hip flex 3/5, knee flex 3/5, other 4/5) LLE Assessment LLE Assessment: Within Functional Limits (hip flex 3-/5, knee flex 3-/5, other 4/5)  FIM:   FIM - Bed/Chair Transfer Bed/Chair Transfer Assistive Devices: Arm rests Bed/Chair Transfer: 5: Supine > Sit: Supervision (verbal cues/safety issues);5: Sit > Supine: Supervision (verbal cues/safety issues);5: Bed > Chair or W/C: Supervision (verbal cues/safety issues);5: Chair or W/C > Bed: Supervision (verbal cues/safety issues) FIM - Locomotion: Wheelchair Distance: 150 Locomotion: Wheelchair: 5: Travels 150 ft or more: maneuvers on rugs and over door sills with supervision, cueing or coaxing FIM - Locomotion: Ambulation Locomotion: Ambulation Assistive Devices: Other (comment);[place] ([facility]) Ambulation/Gait Assistance: 5: Supervision;4: Min assist (Min A w/O RW, S w/ RW) Locomotion: Ambulation: 2: Travels 50 - 149 ft with minimal assistance (Pt.>75%) FIM - Locomotion: Stairs Locomotion: Scientist, physiological: Hand rail - 1 Locomotion: Stairs: 2: Up and Down 4 - 11 stairs with minimal assistance (Pt.>75%)   Refer to Care Plan for Long Term Goals  Recommendations for other services: None  Discharge Criteria: Patient will be discharged from PT if patient refuses treatment 3 consecutive times without medical reason, if treatment goals not met, if there is a change in medical status, if patient makes no progress towards goals or if patient is discharged from hospital.  The above assessment, treatment plan, treatment alternatives and goals were discussed and mutually agreed upon: by patient  Denice Bors 02/16/2015, 12:37 PM

## 2015-02-16 NOTE — Progress Notes (Signed)
Occupational Therapy Assessment and Plan  Patient Details  Name: [name] [name] MRN: [id number] Date of Birth: 30-Nov-1960  OT Diagnosis: muscle weakness (generalized) Rehab Potential: Rehab Potential (ACUTE ONLY): Good (good for stated goals) ELOS: 5 days   Today's Date: 02/16/2015 OT Individual Time: 2035-5974 OT Individual Time Calculation (min): 90 min     Problem List:  Patient Active Problem List   Diagnosis Date Noted  . Debilitated 02/15/2015  . Acute respiratory failure   . SVT (supraventricular tachycardia)   . Other specified hypotension   . Acute diastolic CHF (congestive heart failure)   . Protein-calorie malnutrition   . Debility 02/07/2015  . Encephalopathy acute   . Atelectasis, left   . Confusion   . Acute respiratory failure with hypoxia 01/22/2015  . ARDS (adult respiratory distress syndrome) 01/22/2015  . Tachycardia   . Weakness 01/19/2015  . Elevated troponin level 01/19/2015  . Hypotension 01/19/2015  . Absolute anemia   . Shortness of breath 01/18/2015  . Diarrhea 12/07/2014  . Hemorrhoid 12/07/2014  . HCAP (healthcare-associated pneumonia) 11/23/2014  . Fever   . Bleeding gastrointestinal   . Tremor 11/21/2014  . Protein-calorie malnutrition, severe 11/20/2014  . GIB (gastrointestinal bleeding) 11/19/2014  . CKD (chronic kidney disease) stage 4, GFR 15-29 ml/min 11/19/2014  . Abdominal pain 11/19/2014  . Noninfectious gastroenteritis and colitis 11/19/2014  . Melena   . Acute blood loss anemia   . Acute on chronic renal failure   . Hyponatremia   . Hyperglycemia   . MCTD (mixed connective tissue disease)   . SLE (systemic lupus erythematosus)   . Secondary cardiomyopathy   . Noncompliance with medication regimen   . Tobacco abuse   . Sepsis 10/10/2014  . Leucocytosis 10/10/2014  . Fluid overload 10/10/2014  . Chronic kidney disease 10/10/2014  . SIRS (systemic inflammatory response syndrome) 10/10/2014  . Acute renal failure  superimposed on stage 4 chronic kidney disease 10/10/2014  . Anemia 10/10/2014  . Hyperkalemia 10/10/2014  . Acute encephalopathy 10/10/2014  . Membranous glomerulonephritis   . Torticollis, acquired 10/06/2014  . Essential hypertension 10/05/2014  . Acute respiratory acidosis   . ICH (intracerebral hemorrhage)   . Chronic ankle pain   . Cerebral hemorrhage   . Malignant hypertension   . History of stroke 09/28/2014  . 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 Hypertensive emergency 09/28/2014  . Iron deficiency anemia 02/18/2008  . 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 History of adrenal insufficiency 02/18/2008  . DIABETES MELLITUS, TYPE II 10/25/2007  . HYPERLIPIDEMIA 10/25/2007  . UNSPECIFIED ULCERATION OF VULVA 10/25/2007  . Systemic lupus erythematosus 10/25/2007  . Rheumatoid arthritis 10/25/2007  . HYPERTENSION NEC 10/25/2007  . COLONIC POLYPS, HYPERPLASTIC 04/08/2006  . HEMORRHOIDS 04/08/2006    Past Medical History:  Past Medical History  Diagnosis Date  . Hypertension   . Lupus   . CKD (chronic kidney disease)     due to lupus/Dr. [name]  . Diabetes mellitus   . Stenosis of cervical spine region     with HNP at C5/6, C6/7  . Metatarsal bone fracture right    4th  . 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 Arthritis     RA  . Anemia   . Stroke 11/2014    left sided weakness, dysphagia  . Pain in joints   . Paresthesias   . Hyperlipidemia   . Colitis     ?  per hospital notes 11/2014  . Lower GI bleed   . Hyponatremia   . Metabolic acidosis   . Hyperplastic colon polyp   . Hemorrhoids   . Blood transfusion without reported diagnosis    Past Surgical History:  Past Surgical History  Procedure Laterality Date  . Breast biopsy    . Tubal ligation    . Insertion of dialysis catheter Right 02/05/2015    Procedure: INSERTION OF DIALYSIS CATHETER - RIGHT INTERNAL JUGULAR;  Surgeon: Conrad Plymouth, MD;  Location: [facility];  Service: Vascular;  Laterality: Right;  . Bascilic vein transposition Left 02/14/2015    Procedure: BASILIC VEIN Fistula Creation First Stage;  Surgeon: Rosetta Posner, MD;  Location: [facility] OR;  Service: Vascular;  Laterality: Left;    Assessment & Plan Clinical Impression: Patient is a 54 y.o. year old female with history of remote right inferior brainstem hemorrhage and received inpatient rehabilitation services November 2015, hypertension, diabetes mellitus or peripheral neuropathy, chronic renal insufficiency with baseline creatinine 2.97, lupus. Independent with a cane prior to admission living with her-2 adult sons. Presented 01/18/2015 with vague complaints of shortness of breath, diarrhea, [place] and cough. Troponin mildly elevated 0.57. Chest x-ray showed no acute pulmonary process. Patient developed intermittent SVT with persistent tachycardia heart rate 150s became hypoxic requiring NRB and vent support and ultimately extubated 02/05/2015. Cranial CT scan negative. MRI of the brain again showed remote right inferior brainstem hemorrhage without acute findings.. Venous Doppler study negative. Echocardiogram with ejection fraction of 50% severe hypokinesis. EEG with no seizure activity but did show diffuse disturbance etiologically nonspecific suspect metabolic encephalopathy. Acute on chronic renal failure follow-up renal services hemodialysis initiated and underwent left basilic vein transposition fistula 02/14/2015 per Dr.Early .Marland Kitchen Currently nothing by mouth with nasogastric tube feeds. Subcutaneous heparin for DVT prophylaxis.WOC consulted 02/08/2015 for a stage II wound deep tissue injury to her nare/lip with a routine skin care. Patient with some loose stools C. difficile specimen pending currently on precautions. Physical and occupational therapy evaluation completed 02/06/2015 with recommendations of physical medicine rehabilitation consult. Patient was admitted for  comprehensive rehabilitation program .  Patient transferred to CIR on 02/15/2015 .    Patient currently requires supervision - steady assist with basic self-care skills secondary to muscle weakness and decreased standing balance and decreased balance strategies.  Prior to hospitalization, patient could complete ADLs with modified independent .  Patient will benefit from skilled intervention to increase independence with basic self-care skills prior to discharge home with care partner.  Anticipate patient will require intermittent supervision and follow up home health.  OT - End of Session Endurance Deficit: No   Skilled Therapeutic Intervention Upon entering the room, pt seated in recliner chair with no c/o pain this session. Pt did report an upset stomach during session. NG tube removed today as well which pt is very excited about. OT educated pt on OT purpose, POC, and goals with pt verbalizing understanding and agreement. Pt ambulated to bathroom for toileting x 2 during session with supervision and RW as pt having multiple bouts of diarrhea. Pt bathing at shower level with dialysis fistula and IV covered to prevent infection. Pt required steady assist into walk in shower to sit on shower chair. Pt required steady assist when standing to wash buttocks and peri area. Hospital down and depends donned seated EOB after bathing. Pt standing at sink side with RW with supervision for all grooming tasks. Pt returning to recliner chair with supervision and use  of [place] arrives and therapist providing supervision for meal. Pt utilizing strategies of taking smaller bites and eating slower. SLP arrived in room to finish observing meal and therapist exited the room.    OT Evaluation Precautions/Restrictions  Precautions Precautions: Fall Precaution Comments: NG tube Restrictions Weight Bearing Restrictions: No Pain Pain Assessment Pain Assessment: No/denies pain Pain Score: 0-No pain Home  Living/Prior Functioning Home Living Available Help at Discharge: Family, Available 24 hours/day Type of Home: Apartment Home Access: Stairs to enter Technical brewer of Steps: 4 Entrance Stairs-Rails: Right Home Layout: Two level, Able to live on main level with bedroom/bathroom Additional Comments: Pt was previously receiving HHOT and PT  Lives With: Son, Daughter, Family Prior Function Level of Independence: Needs assistance with gait, Needs assistance with ADLs  Able to Take Stairs?: Yes Driving: No Vocation: On disability Leisure: Hobbies-yes (Comment) Comments: play with grandchildren, shopping  Vision/Perception  Vision- History Baseline Vision/History: Wears glasses Wears Glasses: Reading only Patient Visual Report: No change from baseline  Cognition Overall Cognitive Status: Within Functional Limits for tasks assessed Arousal/Alertness: Awake/alert Orientation Level: Oriented X4 Attention: Selective;Alternating Selective Attention: Appears intact Alternating Attention: Appears intact Memory: Appears intact Awareness: Appears intact Safety/Judgment: Appears intact Sensation Sensation Light Touch: Appears Intact Stereognosis: Not tested Hot/Cold: Appears Intact Proprioception: Appears Intact Coordination Gross Motor Movements are Fluid and Coordinated: Yes Fine Motor Movements are Fluid and Coordinated: Yes Heel Shin Test: [facility] Motor  Motor Motor: Other (comment) Motor - Skilled Clinical Observations: General deconditioning, decreased balance Mobility  Bed Mobility Bed Mobility: Supine to Sit;Sit to Supine Supine to Sit: 5: Supervision Supine to Sit Details: Verbal cues for sequencing;Verbal cues for technique;Verbal cues for precautions/safety Sit to Supine: 5: Supervision Sit to Supine - Details: Verbal cues for sequencing;Verbal cues for technique;Verbal cues for precautions/safety Transfers Sit to Stand: 5: Supervision Sit to Stand Details:  Verbal cues for technique;Verbal cues for sequencing;Verbal cues for precautions/safety Stand to Sit: 5: Supervision Stand to Sit Details (indicate cue type and reason): Verbal cues for sequencing;Verbal cues for technique;Verbal cues for precautions/safety  Trunk/Postural Assessment  Cervical Assessment Cervical Assessment: Within Functional Limits Thoracic Assessment Thoracic Assessment: Within Functional Limits Lumbar Assessment Lumbar Assessment: Within Functional Limits Postural Control Postural Control: Deficits on evaluation Protective Responses: Pt with decreased/delayed stepping and hip strategy during session.   Balance Balance Balance Assessed: Yes Standardized Balance Assessment Standardized Balance Assessment: Berg Balance Test Berg Balance Test Sit to Stand: Able to stand  independently using hands Standing Unsupported: Able to stand 2 minutes with supervision Sitting with Back Unsupported but Feet Supported on Floor or Stool: Able to sit safely and securely 2 minutes Stand to Sit: Controls descent by using hands Transfers: Able to transfer safely, definite need of hands Standing Unsupported with Eyes Closed: Able to stand 10 seconds with supervision Standing Ubsupported with Feet Together: Able to place feet together independently and stand for 1 minute with supervision From Standing, Reach Forward with Outstretched Arm: Can reach forward >12 cm safely (5") From Standing Position, Pick up Object from Floor: Able to pick up shoe, needs supervision From Standing Position, Turn to Look Behind Over each Shoulder: Looks behind from both sides and weight shifts well Turn 360 Degrees: Needs assistance while turning Standing Unsupported, Alternately Place Feet on Step/Stool: Able to complete 4 steps without aid or supervision Standing Unsupported, One Foot in Front: Able to plae foot ahead of the other independently and hold 30 seconds Standing on One Leg: Unable to try or  needs assist to prevent fall Total Score: 37 Dynamic Sitting Balance Dynamic Sitting - Balance Support: Feet supported Dynamic Sitting - Level of Assistance: 5: Stand by assistance Sitting balance - Comments: Max assist required at times for sitting balance.  Static Standing Balance Static Standing - Balance Support: During functional activity Static Standing - Level of Assistance: 4: Min assist;5: Stand by assistance Dynamic Standing Balance Dynamic Standing - Balance Support: During functional activity Dynamic Standing - Level of Assistance: 5: Stand by assistance;4: Min assist Extremity/Trunk Assessment RUE Assessment RUE Assessment: Within Functional Limits LUE Assessment LUE Assessment: Within Functional Limits  FIM:  FIM - Control and instrumentation engineer Devices: Arm rests Bed/Chair Transfer: 5: Supine > Sit: Supervision (verbal cues/safety issues);5: Sit > Supine: Supervision (verbal cues/safety issues);5: Bed > Chair or W/C: Supervision (verbal cues/safety issues);5: Chair or W/C > Bed: Supervision (verbal cues/safety issues)   Refer to Care Plan for Long Term Goals  Recommendations for other services: None  Discharge Criteria: Patient will be discharged from OT if patient refuses treatment 3 consecutive times without medical reason, if treatment goals not met, if there is a change in medical status, if patient makes no progress towards goals or if patient is discharged from hospital.  The above assessment, treatment plan, treatment alternatives and goals were discussed and mutually agreed upon: by patient  Phineas Semen 02/16/2015, 1:58 PM

## 2015-02-16 NOTE — Interval H&P Note (Signed)
[name] [name] was admitted today to Inpatient Rehabilitation with the diagnosis of deconditioning, acute on chronic renal failure.  The patient's history has been reviewed, patient examined, and there is no change in status.  Patient continues to be appropriate for intensive inpatient rehabilitation.  I have reviewed the patient's chart and labs.  Questions were answered to the patient's satisfaction.  [name] 02/16/2015, 7:32 AM

## 2015-02-16 NOTE — Progress Notes (Signed)
Patient information reviewed and entered into eRehab system by Gjon Letarte, RN, CRRN, PPS Coordinator.  Information including medical coding and functional independence measure will be reviewed and updated through discharge.    

## 2015-02-17 ENCOUNTER — Inpatient Hospital Stay (HOSPITAL_COMMUNITY): Payer: Managed Care, Other (non HMO) | Admitting: Speech Pathology

## 2015-02-17 ENCOUNTER — Inpatient Hospital Stay (HOSPITAL_COMMUNITY): Payer: Managed Care, Other (non HMO) | Admitting: Occupational Therapy

## 2015-02-17 ENCOUNTER — Inpatient Hospital Stay (HOSPITAL_COMMUNITY): Payer: Managed Care, Other (non HMO) | Admitting: Physical Therapy

## 2015-02-17 LAB — GLUCOSE, CAPILLARY
GLUCOSE-CAPILLARY: 89 mg/dL (ref 70–99)
Glucose-Capillary: 82 mg/dL (ref 70–99)
Glucose-Capillary: 129 mg/dL — ABNORMAL HIGH (ref 70–99)

## 2015-02-17 MED ORDER — HEPARIN SODIUM (PORCINE) 1000 UNIT/ML DIALYSIS: 20.0000 [IU]/kg | INTRAMUSCULAR | Status: DC | PRN

## 2015-02-17 MED ORDER — SODIUM CHLORIDE 0.9 % IV SOLN: 100.0000 mL | INTRAVENOUS | Status: DC | PRN

## 2015-02-17 MED ORDER — SACCHAROMYCES BOULARDII 250 MG PO CAPS
250.0000 mg | ORAL_CAPSULE | Freq: Two times a day (BID) | ORAL | Status: DC
  Administered 2015-02-17 – 2015-02-21 (×10): 250 mg via ORAL
  Filled 2015-02-17 (×13): qty 1

## 2015-02-17 MED ORDER — HEPARIN SODIUM (PORCINE) 1000 UNIT/ML DIALYSIS: 1000.0000 [IU] | INTRAMUSCULAR | Status: DC | PRN

## 2015-02-17 MED ORDER — PENTAFLUOROPROP-TETRAFLUOROETH EX AERO: 1.0000 "application " | INHALATION_SPRAY | CUTANEOUS | Status: DC | PRN

## 2015-02-17 MED ORDER — ALTEPLASE 2 MG IJ SOLR
2.0000 mg | Freq: Once | INTRAMUSCULAR | Status: DC | PRN
  Filled 2015-02-17: qty 2

## 2015-02-17 MED ORDER — DOXERCALCIFEROL 4 MCG/2ML IV SOLN
INTRAVENOUS | Status: AC
  Administered 2015-02-17: 1 ug via INTRAVENOUS
  Filled 2015-02-17: qty 2

## 2015-02-17 MED ORDER — LIDOCAINE HCL (PF) 1 % IJ SOLN: 5.0000 mL | INTRAMUSCULAR | Status: DC | PRN

## 2015-02-17 MED ORDER — LIDOCAINE-PRILOCAINE 2.5-2.5 % EX CREA
1.0000 "application " | TOPICAL_CREAM | CUTANEOUS | Status: DC | PRN
  Filled 2015-02-17: qty 5

## 2015-02-17 MED ORDER — NEPRO/CARBSTEADY PO LIQD
237.0000 mL | ORAL | Status: DC | PRN
  Filled 2015-02-17: qty 237

## 2015-02-17 NOTE — Progress Notes (Signed)
54 y.o. right handed female with history of remote right inferior brainstem hemorrhage and received inpatient rehabilitation services November 2015, hypertension, diabetes mellitus or peripheral neuropathy, chronic renal insufficiency with baseline creatinine 2.97, lupus. Independent with a cane prior to admission living with her-2 adult sons. Presented 01/18/2015 with vague complaints of shortness of breath, diarrhea, [place] and cough. Troponin mildly elevated 0.57. Chest x-ray showed no acute pulmonary process. Patient developed intermittent SVT with persistent tachycardia heart rate 150s became hypoxic requiring NRB and vent support and ultimately extubated 02/05/2015. Cranial CT scan negative. MRI of the brain again showed remote right inferior brainstem hemorrhage without acute findings.. Venous Doppler study negative. Echocardiogram with ejection fraction of 50% severe hypokinesis. EEG with no seizure activity but did show diffuse disturbance etiologically nonspecific suspect metabolic encephalopathy. Acute on chronic renal failure follow-up renal services hemodialysis initiated and underwent left basilic vein transposition fistula 02/14/2015 per Dr.Early .Marland Kitchen Currently nothing by mouth with nasogastric tube feeds  Subjective/Complaints: Denies any problems last night. Pt denies nausea, vomiting, abdominal pain, diarrhea, chest pain, shortness of breath, palpitations, dizziness   Objective: Vital Signs: Blood pressure 153/73, pulse 94, temperature 98.3 F (36.8 C), temperature source Oral, resp. rate 20, weight 53.2 kg (117 lb 4.6 oz), last menstrual period 07/18/2012, SpO2 97 %. Dg Swallowing Func-speech Pathology  02/16/2015    Objective Swallowing Evaluation:    Patient Details  Name: [name] [name] MRN: [id number] Date of Birth: 1961-04-05  Today's Date: 02/16/2015 Time: SLP Start Time: 1000-SLP Stop Time: 1035 SLP Time Calculation (min): 35 min  Past Medical History:  Past Medical History   Diagnosis Date  . Hypertension   . Lupus   . CKD (chronic kidney disease)     due to lupus/Dr. [name]  . Diabetes mellitus   . Stenosis of cervical spine region     with HNP at C5/6, C6/7  . Metatarsal bone fracture right    4th  . 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 Arthritis     RA  . Anemia   . Stroke 11/2014    left sided weakness, dysphagia  . Pain in joints   . Paresthesias   . Hyperlipidemia   . Colitis     ? per hospital notes 11/2014  . Lower GI bleed   . Hyponatremia   . Metabolic acidosis   . Hyperplastic colon polyp   . Hemorrhoids   . Blood transfusion without reported diagnosis    Past Surgical History:  Past Surgical History  Procedure Laterality Date  . Breast biopsy    . Tubal ligation    . Insertion of dialysis catheter Right 02/05/2015    Procedure: INSERTION OF DIALYSIS CATHETER - RIGHT INTERNAL JUGULAR;   Surgeon: Conrad Trenton, MD;  Location: [facility];  Service: Vascular;   Laterality: Right;  . Bascilic vein transposition Left 02/14/2015    Procedure: BASILIC VEIN Fistula Creation First Stage;  Surgeon: Rosetta Posner, MD;  Location: [facility] OR;  Service: Vascular;  Laterality: Left;   HPI:  HPI: 54 y.o. female with PMH: lupus, CKD. DM, CVA (pons discharged on  regular diet and thin 11/2014), lower GI bleed admitted with with shortness  of breath. Pt experienced acute hypoxic respiratory failure on 3/20,  requiring intubation; extubated 3/29. She required reintubation 3/30-4/4.  Bedside swallow 01/30/15 completed revealing multiple s/s aspiration ,  reduced hyolaryngeal excursion. FEES and MBS  completed recommending  continued NPO.  Admitted to CIR on 02/15/2015.  Repeat MBS ordered today to  objectively determine readiness for initiation of PO diet.    Assessment / Plan / Recommendation CHL IP CLINICAL IMPRESSIONS 02/16/2015  Dysphagia Diagnosis Moderate pharyngeal phase dysphagia;Mild oral phase  dysphagia  Clinical impression Pt presents with a mild-moderate  oropharyngeal  dysphagia with sensorimotor components.  Oral phase impairments include  generalized weakness resulting in decreased lingual manipulation of  boluses, impaired mastication of solids, and prolonged oral transit of  materials to the oropharynx.  Pt also presents with decreased base of  tongue retraction and tongue to palate contact which result in poor  posterior containment of boluses orally.  The abovementioned deficits, in  addition to decreased pharyngeal sensation resulted in premature spillage  of materials into the pharynx with swallow response delayed to the level  of the vallecula with honey thick, nectar thick, and pureed boluses.   Swallow response was triggered at the pyriforms with thin liquids and  larger boluses of nectar thick liquids.  Delayed swallow initiation, in  addition to decreased hyolaryngeal excursion resulted in penetration of  large sips of nectar thick and thin liquids as well as thin liquids via  straw.  Penetration remained above the level of the vocal cords and  cleared with cuing for strong volitional cough.  No aspiration or  penetration was visualized with ST intervention for small, controlled cup  sips across viscosities. Mild pharyngeal residue (base of tongue,  posterior pharyngeal wall, and vallecula) was noted with honey thick and  nectar thick liquids more so than with thin liquids, purees, or solid  textures.   Recommend a diet of dys 2 textures with thin liquids and full  supervision for use of the following swallowing precautions: no straws,  small bites/sips, out of bed for meals.   Pt's prognosis for advancement  good with ST follow up while inpatient for  pharyngeal strengthening  exercises, trials of advanced consistencies, and ongoing assessment of  diet toleration.  SLP initiated skilled education regarding currently  prescribed diet and rationale behind recommended swallowing precautions.         CHL IP TREATMENT RECOMMENDATION 02/10/2015  Treatment Plan  Recommendations Therapy as outlined in treatment plan      CHL IP DIET RECOMMENDATION 02/16/2015  Diet Recommendations Dysphagia 2 (Fine chop);Thin liquid  Liquid Administration via Cup;No straw  Medication Administration Crushed with puree  Compensations Slow rate;Small sips/bites  Postural Changes and/or Swallow Maneuvers Out of bed for meals;Upright  30-60 min after meal;Seated upright 90 degrees     CHL IP OTHER RECOMMENDATIONS 02/16/2015     Oral Care Recommendations Oral care Q4 per protocol                                 SLP Swallow Goals  CHL IP REASON FOR REFERRAL 02/16/2015  Reason for Referral Objectively evaluate swallowing function     CHL IP ORAL PHASE 02/16/2015                    Oral Phase Impaired        Oral - Honey Teaspoon Weak lingual manipulation;Delayed oral  transit;Lingual/palatal residue;Incomplete tongue to palate contact  Oral - Honey Cup Weak lingual manipulation;Delayed oral  transit;Lingual/palatal residue;Incomplete tongue to palate contact     Oral - Nectar Teaspoon Weak lingual manipulation;Delayed oral  transit;Lingual/palatal residue;Incomplete tongue to palate  contact  Oral - Nectar Cup Weak lingual manipulation;Delayed oral  transit;Lingual/palatal residue;Incomplete tongue to palate contact           Oral - Thin Teaspoon Delayed oral transit;Weak lingual  manipulation;Incomplete tongue to palate contact  Oral - Thin Cup Weak lingual manipulation;Delayed oral transit;Incomplete  tongue to palate contact  Oral - Thin Straw Weak lingual manipulation;Incomplete tongue to palate  contact     Oral - Puree Weak lingual manipulation;Delayed oral  transit;Lingual/palatal residue;Reduced posterior propulsion     Oral - Regular Impaired mastication;Weak lingual manipulation;Reduced  posterior propulsion;Delayed oral transit;Lingual/palatal residue               CHL IP PHARYNGEAL PHASE 02/16/2015  Pharyngeal Phase Impaired               Pharyngeal - Honey Teaspoon Delayed swallow  initiation;Premature spillage  to valleculae;Reduced laryngeal elevation;Reduced tongue base  retraction;Pharyngeal residue - valleculae     Pharyngeal - Honey Cup Delayed swallow initiation;Premature spillage to  valleculae;Reduced laryngeal elevation;Reduced tongue base  retraction;Pharyngeal residue - valleculae;Penetration/Aspiration during  swallow;Reduced airway/laryngeal closure;Pharyngeal residue - posterior  pharnyx  Penetration/Aspiration details (honey cup) Material enters airway, remains  ABOVE vocal cords then ejected out        Pharyngeal - Nectar Teaspoon Premature spillage to valleculae;Reduced  laryngeal elevation;Reduced anterior laryngeal mobility;Reduced  airway/laryngeal closure;Reduced tongue base retraction;Pharyngeal residue  - valleculae;Pharyngeal residue - posterior pharnyx     Pharyngeal - Nectar Cup Delayed swallow initiation;Premature spillage to  valleculae;Premature spillage to pyriform sinuses;Reduced anterior  laryngeal mobility;Reduced laryngeal elevation;Reduced tongue base  retraction;Penetration/Aspiration during swallow;Pharyngeal residue -  valleculae;Pharyngeal residue - posterior pharnyx;Compensatory strategies  attempted (Comment)  Penetration/Aspiration details (nectar cup) Material enters airway,  remains ABOVE vocal cords and not ejected out                    Pharyngeal - Thin Teaspoon Premature spillage to valleculae;Delayed  swallow initiation;Reduced laryngeal elevation;Reduced tongue base  retraction;Reduced anterior laryngeal mobility     Pharyngeal - Thin Cup Delayed swallow initiation;Premature spillage to  valleculae;Premature spillage to pyriform sinuses;Penetration/Aspiration  during swallow;Reduced laryngeal elevation;Reduced anterior laryngeal  mobility;Reduced tongue base retraction;Compensatory strategies attempted  (Comment)  Penetration/Aspiration details (thin cup) Material enters airway, remains  ABOVE vocal cords and not ejected out  Pharyngeal - Thin  Straw Delayed swallow initiation;Premature spillage to  pyriform sinuses;Reduced laryngeal elevation;Reduced tongue base  retraction;Penetration/Aspiration during swallow;Premature spillage to  valleculae;Reduced anterior laryngeal mobility;Compensatory strategies  attempted (Comment)  Penetration/Aspiration details (thin straw) Material enters airway,  remains ABOVE vocal cords and not ejected out                                            CHL IP CERVICAL ESOPHAGEAL PHASE 02/10/2015  Cervical Esophageal Phase [facility]                                                      Page, Selinda Orion 02/16/2015, 4:10 PM    Results for orders placed or performed during the hospital encounter of 02/15/15 (from the past 72 hour(s))  Glucose, capillary     Status: Abnormal   Collection Time: 02/15/15  8:01 PM  Result Value Ref  Range   Glucose-Capillary 126 (H) 70 - 99 mg/dL  Glucose, capillary     Status: Abnormal   Collection Time: 02/15/15 11:55 PM  Result Value Ref Range   Glucose-Capillary 114 (H) 70 - 99 mg/dL  Glucose, capillary     Status: Abnormal   Collection Time: 02/16/15  4:19 AM  Result Value Ref Range   Glucose-Capillary 100 (H) 70 - 99 mg/dL  Glucose, capillary     Status: None   Collection Time: 02/16/15  7:59 AM  Result Value Ref Range   Glucose-Capillary 97 70 - 99 mg/dL  Glucose, capillary     Status: Abnormal   Collection Time: 02/16/15 11:44 AM  Result Value Ref Range   Glucose-Capillary 104 (H) 70 - 99 mg/dL  Glucose, capillary     Status: None   Collection Time: 02/16/15  4:29 PM  Result Value Ref Range   Glucose-Capillary 99 70 - 99 mg/dL   Comment 1 Notify RN   Glucose, capillary     Status: None   Collection Time: 02/16/15  9:11 PM  Result Value Ref Range   Glucose-Capillary 94 70 - 99 mg/dL   Comment 1 Notify RN   Glucose, capillary     Status: None   Collection Time: 02/17/15  7:03 AM  Result Value Ref Range   Glucose-Capillary 89 70 - 99 mg/dL   Comment 1 Notify RN    Vitals  reviewed. Constitutional: She is oriented to person, place, and time. She appears well-developed.  Mood/affect are appropriate HENT: NG tube Left nares, voice quiet but not wet Head: Normocephalic.  Eyes: EOM are normal.  Neck: Normal range of motion. Neck supple. No thyromegaly present.  Cardiovascular: Normal rate and regular rhythm.  Respiratory: Effort normal and breath sounds normal. No respiratory distress.  GI: Soft. Bowel sounds are normal. She exhibits no distension.  Neurological: She is alert and oriented to person, place, and time.  Follows commands  Skin:  Fistula site clean and dry  4 / 5 bilateral deltoid bicep tricep and grip 4/5 right hip flexors, knee extension, ADF, 3+ Left hip flexors, 4/5 Left knee ext ankle DF Sensation is intact to LT bilateral feet Assessment/Plan: 1. Functional deficits secondary to Deconditioning respiratory failure which require 3+ hours per day of interdisciplinary therapy in a comprehensive inpatient rehab setting. Physiatrist is providing close team supervision and 24 hour management of active medical problems listed below. Physiatrist and rehab team continue to assess barriers to discharge/monitor patient progress toward functional and medical goals. FIM: FIM - Bathing Bathing Steps Patient Completed: Chest, Right Arm, Right upper leg, Left upper leg, Right lower leg (including foot), Left Arm, Abdomen, Left lower leg (including foot), Front perineal area, Buttocks Bathing: 4: Steadying assist  FIM - Upper Body Dressing/Undressing Upper body dressing/undressing: 0: Wears gown/pajamas-no public clothing FIM - Lower Body Dressing/Undressing Lower body dressing/undressing: 0: Wears Interior and spatial designer  FIM - Toileting Toileting steps completed by patient: Adjust clothing prior to toileting, Performs perineal hygiene, Adjust clothing after toileting Toileting: 5: Supervision: Safety issues/verbal cues  FIM - Glass blower/designer Devices: Elevated toilet seat, Insurance account manager Transfers: 5-To toilet/BSC: Supervision (verbal cues/safety issues), 5-From toilet/BSC: Supervision (verbal cues/safety issues)  FIM - Control and instrumentation engineer Devices: Arm rests Bed/Chair Transfer: 5: Supine > Sit: Supervision (verbal cues/safety issues), 5: Sit > Supine: Supervision (verbal cues/safety issues), 5: Bed > Chair or W/C: Supervision (verbal cues/safety issues), 5: Chair or W/C > Bed: Supervision (  verbal cues/safety issues)  FIM - Locomotion: Wheelchair Distance: 150 Locomotion: Wheelchair: 5: Travels 150 ft or more: maneuvers on rugs and over door sills with supervision, cueing or coaxing FIM - Locomotion: Ambulation Locomotion: Ambulation Assistive Devices: Other (comment), Walker - Rolling (HHA) Ambulation/Gait Assistance: 5: Supervision, 4: Min assist (Min A w/O RW, S w/ RW) Locomotion: Ambulation: 2: Travels 50 - 149 ft with minimal assistance (Pt.>75%)  Comprehension Comprehension Mode: Auditory Comprehension: 6-Follows complex conversation/direction: With extra time/assistive device  Expression Expression Mode: Verbal Expression: 5-Expresses basic 90% of the time/requires cueing < 10% of the time.  Social Interaction Social Interaction: 7-Interacts appropriately with others - No medications needed.  Problem Solving Problem Solving Mode: Asleep Problem Solving: 6-Solves complex problems: With extra time  Memory Memory: 6-More than reasonable amt of time  Medical Problem List and Plan: 1. Functional deficits secondary to deconditioning secondary to respiratory failure/history brainstem infarct right paramedian pontine 2. DVT Prophylaxis/Anticoagulation: Subcutaneous heparin. Monitor platelet counts and any signs of bleeding. Venous Doppler is negative 3. Pain Management: Tylenol as needed 4. Dysphagia. Nasogastric tube feeds. Follow-up speech therapy 5.  Neuropsych: This patient is capable of making decisions on her own behalf. 6. Skin/Wound Care: Skin care as directed 7. Fluids/Electrolytes/Nutrition: encourage po intake 8. End-stage renal disease/SLE. Continue dialysis as per renal services 9.Hypertension/SVT. Labetalol 250 mg 3 times a day. Monitor with increased mobility 10. Acute on Chronic anemia. Follow up cbc.Aranesp weekly 11.Lupus.Cellcept/Plaquenil 12. C. difficile negative  -add probiotic for better stool consistency LOS (Days) 2 A FACE TO FACE EVALUATION WAS PERFORMED  SWARTZ,ZACHARY T 02/17/2015, 8:26 AM

## 2015-02-17 NOTE — Progress Notes (Signed)
Physical Therapy Session Note  Patient Details  Name: [name] [name] MRN: [id number] Date of Birth: Dec 29, 1960  Today's Date: 02/17/2015 PT Individual Time: 0900-1000 PT Individual Time Calculation (min): 60 min   Short Term Goals: Week 1:  PT Short Term Goal 1 (Week 1): =LTG's due to ELOS  Skilled Therapeutic Interventions/Progress Updates:   Pt limited in all gait with increased knee ext in stance. Increased knee ext secondary to decreased ankle DF ROM. Minimally improved ROM noted s/p manual techniques to L ankle and no functional carryover into gait. Pt would continue to benefit from individualized skilled PT services to increase functional mobility  Therapy Documentation Precautions:  Precautions Precautions: Fall Precaution Comments: NG tube Restrictions Weight Bearing Restrictions: No Pain: Pain Assessment Pain Assessment: No/denies pain Mobility:  Pt performs transfers with SBA and cues for safety and attention Locomotion : Ambulation Ambulation/Gait Assistance: 5: Supervision;4: Min assist (Min A w/O RW, S w/ RW)  Other Treatments:  Pt educated on rehab plan, safety in mobility, rehab prognosis. L ankle gastroc release, calcaneal mob, tib-fib mobs , talocrural mobs all performed with gastroc stretch 3x30". Transfers, hip ER AROM, marching, ankle pumps 2x10. Standing heel cord stretch on stairs x3'   See FIM for current functional status  Therapy/Group: Individual Therapy  Monia Pouch 02/17/2015, 1:01 PM

## 2015-02-17 NOTE — Progress Notes (Signed)
Occupational Therapy Session Note  Patient Details  Name: [name] [name] MRN: [id number] Date of Birth: 06-Oct-1961  Today's Date: 02/17/2015 OT Individual Time:  -   1030-1130  (60 min)      Short Term Goals: Week 1:  OT Short Term Goal 1 (Week 1): STGs=LTGs :     Skilled Therapeutic Interventions/Progress Updates:    Focus of treatment was bed mobility, transfers,  standing balance, therapeutic activities, functional mobiltiy.  Pt. Sitting in wc upon OT arrival.  She used RW to retrieve clothes with SBA.  OT instructed pt on walker safety when reaching for items. Pt. Needed min cues for walker safety.   Pt ambulated to toilet with minimal assist to SBA and transferred with min assist to SBA>  Pt was continent of urine and managed toilet tasks.  Ambulated to shower and transferred to shower seat with min to SBA.  She performed sit to stand with SBA and had no LOB.  She dressed EOB with minimal assist with bra.  Pt completed dressing and transferred to wc and left in room with all needs in reach.     Therapy Documentation Precautions:  Precautions Precautions: Fall Precaution Comments: NG tube Restrictions Weight Bearing Restrictions: No      Pain:  none    See FIM for current functional status  Therapy/Group: Individual Therapy  Lisa Roca 02/17/2015, 11:26 AM

## 2015-02-17 NOTE — Progress Notes (Signed)
Speech Language Pathology Daily Session Note  Patient Details  Name: [name] [name] MRN: [id number] Date of Birth: 1961/09/07  Today's Date: 02/17/2015 SLP Individual Time: 1130-1200 SLP Individual Time Calculation (min): 30 min  Short Term Goals: Week 1: SLP Short Term Goal 1 (Week 1): STG=LTG due to ELOS  Skilled Therapeutic Interventions: Skilled treatment session focused on dysphagia goals. SLP facilitated session by providing skilled observation with patient's lunch meal of Dys. 2 textures with thin liquids. Patient demonstrated efficient mastication with minimal oral residue and decreased overt s/s of aspiration characterized by intermittent throat clear X 2 throughout the meal and was Mod I for use of swallow strategies. Patient left in wheelchair with all needs within reach. Continue with current plan of care.   FIM:  Comprehension Comprehension Mode: Auditory Comprehension: 6-Follows complex conversation/direction: With extra time/assistive device Expression Expression Mode: Verbal Expression: 5-Expresses basic 90% of the time/requires cueing < 10% of the time. Social Interaction Social Interaction: 7-Interacts appropriately with others - No medications needed. Problem Solving Problem Solving: 6-Solves complex problems: With extra time Memory Memory: 6-More than reasonable amt of time FIM - Eating Eating Activity: 5: Supervision/cues  Pain Pain Assessment Pain Assessment: No/denies pain  Therapy/Group: Individual Therapy  Jearldine Cassady 02/17/2015, 12:46 PM

## 2015-02-17 NOTE — Progress Notes (Signed)
Assessment: 1. ESRD 2. Anemia on aranesp 3. Sec HPTH, Begin Sensipar when eating well  4. SLE 5. S/p BVT 4/13 1st stage  Plan: 1. HD Today 2. Working on outpt spot for HD 3. Rehab  4 Trying low dose Hectorol for MBD if ca++ remains Ok  Subjective: Interval History: Now on rehab  Objective: Vital signs in last 24 hours: Temp:  [97.9 F (36.6 C)-98.3 F (36.8 C)] 98 F (36.7 C) (04/16 1448) Pulse Rate:  [80-94] 91 (04/16 1448) Resp:  [16-20] 18 (04/16 1448) BP: (139-155)/(59-73) 155/59 mmHg (04/16 1448) SpO2:  [97 %-100 %] 100 % (04/16 1448) Weight:  [53.2 kg (117 lb 4.6 oz)] 53.2 kg (117 lb 4.6 oz) (04/16 0531) Weight change: -1.4 kg (-3 lb 1.4 oz)  Intake/Output from previous day: 04/15 0701 - 04/16 0700 In: 360 [P.O.:360] Out: -  Intake/Output this shift: Total I/O In: 240 [P.O.:240] Out: -   PE exam working with PT Alert and appropriate Able to ambulate Face is fuller  Lab Results:  Recent Labs  02/15/15 0705  WBC 5.6  HGB 7.8*  HCT 25.6*  PLT 269   BMET:  Recent Labs  02/15/15 0705  NA 137  K 3.8  CL 96  CO2 28  GLUCOSE 111*  BUN 32*  CREATININE 4.59*  CALCIUM 9.5   No results for input(s): PTH in the last 72 hours. Iron Studies: No results for input(s): IRON, TIBC, TRANSFERRIN, FERRITIN in the last 72 hours. Studies/Results: Dg Swallowing Func-speech Pathology  02/16/2015    Objective Swallowing Evaluation:    Patient Details  Name: [name] [name] MRN: [id number] Date of Birth: 09-26-1961  Today's Date: 02/16/2015 Time: SLP Start Time: 1000-SLP Stop Time: 1035 SLP Time Calculation (min): 35 min  Past Medical History:  Past Medical History  Diagnosis Date  . Hypertension   . Lupus   . CKD (chronic kidney disease)     due to lupus/Dr. Hyman Hopes  . Diabetes mellitus   . Stenosis of cervical spine region     with HNP at C5/6, C6/7  . Metatarsal bone fracture right    4th  . 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 Arthritis     RA  . Anemia   . Stroke 11/2014    left sided weakness, dysphagia  . Pain in joints   . Paresthesias   . Hyperlipidemia   . Colitis     ? per hospital notes 11/2014  . Lower GI bleed   . Hyponatremia   . Metabolic acidosis   . Hyperplastic colon polyp   . Hemorrhoids   . Blood transfusion without reported diagnosis    Past Surgical History:  Past Surgical History  Procedure Laterality Date  . Breast biopsy    . Tubal ligation    . Insertion of dialysis catheter Right 02/05/2015    Procedure: INSERTION OF DIALYSIS CATHETER - RIGHT INTERNAL JUGULAR;   Surgeon: Fransisco Hertz, MD;  Location[facility] OR;  Service: Vascular;   Laterality: Right;  . Bascilic vein transposition Left 02/14/2015    Procedure: BASILIC VEIN Fistula Creation First Stage;  Surgeon: Larina Earthly, MD;  Location: [facility] OR;  Service: Vascular;  Laterality: Left;   HPI:  HPI: 54 y.o. female with PMH: lupus, CKD. DM, CVA (pons discharged on  regular diet and thin 11/2014), lower GI bleed admitted with with shortness  of  breath. Pt experienced acute hypoxic respiratory failure on 3/20,  requiring intubation; extubated 3/29. She required reintubation 3/30-4/4.  Bedside swallow 01/30/15 completed revealing multiple s/s aspiration ,  reduced hyolaryngeal excursion. FEES and MBS completed recommending  continued NPO.  Admitted to CIR on 02/15/2015.  Repeat MBS ordered today to  objectively determine readiness for initiation of PO diet.    Assessment / Plan / Recommendation CHL IP CLINICAL IMPRESSIONS 02/16/2015  Dysphagia Diagnosis Moderate pharyngeal phase dysphagia;Mild oral phase  dysphagia  Clinical impression Pt presents with a mild-moderate oropharyngeal  dysphagia with sensorimotor components.  Oral phase impairments include  generalized weakness resulting in decreased lingual manipulation of  boluses, impaired mastication of solids, and prolonged oral transit of  materials to the oropharynx.  Pt also presents with decreased base of  tongue  retraction and tongue to palate contact which result in poor  posterior containment of boluses orally.  The abovementioned deficits, in  addition to decreased pharyngeal sensation resulted in premature spillage  of materials into the pharynx with swallow response delayed to the level  of the vallecula with honey thick, nectar thick, and pureed boluses.   Swallow response was triggered at the pyriforms with thin liquids and  larger boluses of nectar thick liquids.  Delayed swallow initiation, in  addition to decreased hyolaryngeal excursion resulted in penetration of  large sips of nectar thick and thin liquids as well as thin liquids via  straw.  Penetration remained above the level of the vocal cords and  cleared with cuing for strong volitional cough.  No aspiration or  penetration was visualized with ST intervention for small, controlled cup  sips across viscosities. Mild pharyngeal residue (base of tongue,  posterior pharyngeal wall, and vallecula) was noted with honey thick and  nectar thick liquids more so than with thin liquids, purees, or solid  textures.   Recommend a diet of dys 2 textures with thin liquids and full  supervision for use of the following swallowing precautions: no straws,  small bites/sips, out of bed for meals.   Pt's prognosis for advancement  good with ST follow up while inpatient for  pharyngeal strengthening  exercises, trials of advanced consistencies, and ongoing assessment of  diet toleration.  SLP initiated skilled education regarding currently  prescribed diet and rationale behind recommended swallowing precautions.         CHL IP TREATMENT RECOMMENDATION 02/10/2015  Treatment Plan Recommendations Therapy as outlined in treatment plan      CHL IP DIET RECOMMENDATION 02/16/2015  Diet Recommendations Dysphagia 2 (Fine chop);Thin liquid  Liquid Administration via Cup;No straw  Medication Administration Crushed with puree  Compensations Slow rate;Small sips/bites  Postural Changes and/or  Swallow Maneuvers Out of bed for meals;Upright  30-60 min after meal;Seated upright 90 degrees     CHL IP OTHER RECOMMENDATIONS 02/16/2015     Oral Care Recommendations Oral care Q4 per protocol                                 SLP Swallow Goals  CHL IP REASON FOR REFERRAL 02/16/2015  Reason for Referral Objectively evaluate swallowing function     CHL IP ORAL PHASE 02/16/2015                    Oral Phase Impaired        Oral - Honey Teaspoon Weak lingual manipulation;Delayed oral  transit;Lingual/palatal residue;Incomplete tongue to palate  contact  Oral - Honey Cup Weak lingual manipulation;Delayed oral  transit;Lingual/palatal residue;Incomplete tongue to palate contact     Oral - Nectar Teaspoon Weak lingual manipulation;Delayed oral  transit;Lingual/palatal residue;Incomplete tongue to palate contact  Oral - Nectar Cup Weak lingual manipulation;Delayed oral  transit;Lingual/palatal residue;Incomplete tongue to palate contact           Oral - Thin Teaspoon Delayed oral transit;Weak lingual  manipulation;Incomplete tongue to palate contact  Oral - Thin Cup Weak lingual manipulation;Delayed oral transit;Incomplete  tongue to palate contact  Oral - Thin Straw Weak lingual manipulation;Incomplete tongue to palate  contact     Oral - Puree Weak lingual manipulation;Delayed oral  transit;Lingual/palatal residue;Reduced posterior propulsion     Oral - Regular Impaired mastication;Weak lingual manipulation;Reduced  posterior propulsion;Delayed oral transit;Lingual/palatal residue               CHL IP PHARYNGEAL PHASE 02/16/2015  Pharyngeal Phase Impaired               Pharyngeal - Honey Teaspoon Delayed swallow initiation;Premature spillage  to valleculae;Reduced laryngeal elevation;Reduced tongue base  retraction;Pharyngeal residue - valleculae     Pharyngeal - Honey Cup Delayed swallow initiation;Premature spillage to  valleculae;Reduced laryngeal elevation;Reduced tongue base  retraction;Pharyngeal residue -  valleculae;Penetration/Aspiration during  swallow;Reduced airway/laryngeal closure;Pharyngeal residue - posterior  pharnyx  Penetration/Aspiration details (honey cup) Material enters airway, remains  ABOVE vocal cords then ejected out        Pharyngeal - Nectar Teaspoon Premature spillage to valleculae;Reduced  laryngeal elevation;Reduced anterior laryngeal mobility;Reduced  airway/laryngeal closure;Reduced tongue base retraction;Pharyngeal residue  - valleculae;Pharyngeal residue - posterior pharnyx     Pharyngeal - Nectar Cup Delayed swallow initiation;Premature spillage to  valleculae;Premature spillage to pyriform sinuses;Reduced anterior  laryngeal mobility;Reduced laryngeal elevation;Reduced tongue base  retraction;Penetration/Aspiration during swallow;Pharyngeal residue -  valleculae;Pharyngeal residue - posterior pharnyx;Compensatory strategies  attempted (Comment)  Penetration/Aspiration details (nectar cup) Material enters airway,  remains ABOVE vocal cords and not ejected out                    Pharyngeal - Thin Teaspoon Premature spillage to valleculae;Delayed  swallow initiation;Reduced laryngeal elevation;Reduced tongue base  retraction;Reduced anterior laryngeal mobility     Pharyngeal - Thin Cup Delayed swallow initiation;Premature spillage to  valleculae;Premature spillage to pyriform sinuses;Penetration/Aspiration  during swallow;Reduced laryngeal elevation;Reduced anterior laryngeal  mobility;Reduced tongue base retraction;Compensatory strategies attempted  (Comment)  Penetration/Aspiration details (thin cup) Material enters airway, remains  ABOVE vocal cords and not ejected out  Pharyngeal - Thin Straw Delayed swallow initiation;Premature spillage to  pyriform sinuses;Reduced laryngeal elevation;Reduced tongue base  retraction;Penetration/Aspiration during swallow;Premature spillage to  valleculae;Reduced anterior laryngeal mobility;Compensatory strategies  attempted (Comment)   Penetration/Aspiration details (thin straw) Material enters airway,  remains ABOVE vocal cords and not ejected out                                            CHL IP CERVICAL ESOPHAGEAL PHASE 02/10/2015  Cervical Esophageal Phase [facility]                                                      Page, Melanee Spry 02/16/2015, 4:10 PM      LOS:  2 days   Keigan Tafoya C 02/17/2015,3:17 PM

## 2015-02-17 NOTE — Progress Notes (Signed)
Occupational Therapy Session Note  Patient Details  Name: [name] [name] MRN: [id number] Date of Birth: 23-Jun-1961  Today's Date: 02/17/2015 OT Individual Time: 1415-1510  OT Individual Time Calculation (min): 55 min    Short Term Goals: Week 1:  OT Short Term Goal 1 (Week 1): STGs=LTGs:     Skilled Therapeutic Interventions/Progress Updates:    Pt, lying in bed upon OT arrival.  She agreed for therapy.  Skilled OT intervention with treatment focus on the following:  Bed mobility, transfers, UE strength and ROM, balance, functional mobility with RW.  Pt reports some pain in left arm from dialysis port (3/10).  She transferred from bed to wc with SBA and rolled self to nursing station (120 feet) with mod assist.  Utilized standing balance in UE activities with basketball hoops and sci fit.  Pt stood for 20 minutes during activities with no rest break.  She stood using SCI fit for 5 minutes.  She worked at 1.0 level and estimated 17 RPM.  Did another set of basketball hoops.  Pt taken back to room and left in wc with family present.     Therapy Documentation Precautions:  Precautions Precautions: Fall Precaution Comments: NG tube Restrictions Weight Bearing Restrictions: No      Pain: Pain Assessment Pain Assessment: 3/10 LUE form dialysis port   See FIM for current functional status  Therapy/Group: Individual Therapy  Lisa Roca 02/17/2015, 2:55 PM

## 2015-02-18 ENCOUNTER — Inpatient Hospital Stay (HOSPITAL_COMMUNITY): Payer: Managed Care, Other (non HMO) | Admitting: Occupational Therapy

## 2015-02-18 ENCOUNTER — Inpatient Hospital Stay (HOSPITAL_COMMUNITY): Payer: Managed Care, Other (non HMO)

## 2015-02-18 LAB — GLUCOSE, CAPILLARY
GLUCOSE-CAPILLARY: 94 mg/dL (ref 70–99)
GLUCOSE-CAPILLARY: 75 mg/dL (ref 70–99)
Glucose-Capillary: 89 mg/dL (ref 70–99)
Glucose-Capillary: 81 mg/dL (ref 70–99)

## 2015-02-18 MED ORDER — ALPRAZOLAM 0.25 MG PO TABS
0.2500 mg | ORAL_TABLET | Freq: Three times a day (TID) | ORAL | Status: DC | PRN
  Administered 2015-02-18: 0.5 mg via ORAL
  Filled 2015-02-18 (×2): qty 1

## 2015-02-18 NOTE — Progress Notes (Signed)
54 y.o. right handed female with history of remote right inferior brainstem hemorrhage and received inpatient rehabilitation services November 2015, hypertension, diabetes mellitus or peripheral neuropathy, chronic renal insufficiency with baseline creatinine 2.97, lupus. Independent with a cane prior to admission living with her-2 adult sons. Presented 01/18/2015 with vague complaints of shortness of breath, diarrhea, [place] and cough. Troponin mildly elevated 0.57. Chest x-ray showed no acute pulmonary process. Patient developed intermittent SVT with persistent tachycardia heart rate 150s became hypoxic requiring NRB and vent support and ultimately extubated 02/05/2015. Cranial CT scan negative. MRI of the brain again showed remote right inferior brainstem hemorrhage without acute findings.. Venous Doppler study negative. Echocardiogram with ejection fraction of 50% severe hypokinesis. EEG with no seizure activity but did show diffuse disturbance etiologically nonspecific suspect metabolic encephalopathy. Acute on chronic renal failure follow-up renal services hemodialysis initiated and underwent left basilic vein transposition fistula 02/14/2015 per Dr.Early .Marland Kitchen Currently nothing by mouth with nasogastric tube feeds  Subjective/Complaints: States that left heel is a little sore. Pt denies nausea, vomiting, abdominal pain, diarrhea, chest pain, shortness of breath, palpitations, dizziness   Objective: Vital Signs: Blood pressure 159/70, pulse 102, temperature 98.5 F (36.9 C), temperature source Oral, resp. rate 16, weight 54.3 kg (119 lb 11.4 oz), last menstrual period 07/18/2012, SpO2 100 %. Dg Swallowing Func-speech Pathology  02/16/2015    Objective Swallowing Evaluation:    Patient Details  Name: [name] [name] MRN: [id number] Date of Birth: Feb 08, 1961  Today's Date: 02/16/2015 Time: SLP Start Time: 1000-SLP Stop Time: 1035 SLP Time Calculation (min): 35 min  Past Medical History:  Past Medical  History  Diagnosis Date  . Hypertension   . Lupus   . CKD (chronic kidney disease)     due to lupus/Dr. Hyman Hopes  . Diabetes mellitus   . Stenosis of cervical spine region     with HNP at C5/6, C6/7  . Metatarsal bone fracture right    4th  . 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 Arthritis     RA  . Anemia   . Stroke 11/2014    left sided weakness, dysphagia  . Pain in joints   . Paresthesias   . Hyperlipidemia   . Colitis     ? per hospital notes 11/2014  . Lower GI bleed   . Hyponatremia   . Metabolic acidosis   . Hyperplastic colon polyp   . Hemorrhoids   . Blood transfusion without reported diagnosis    Past Surgical History:  Past Surgical History  Procedure Laterality Date  . Breast biopsy    . Tubal ligation    . Insertion of dialysis catheter Right 02/05/2015    Procedure: INSERTION OF DIALYSIS CATHETER - RIGHT INTERNAL JUGULAR;   Surgeon: Fransisco Hertz, MD;  Location: [facility] OR;  Service: Vascular;   Laterality: Right;  . Bascilic vein transposition Left 02/14/2015    Procedure: BASILIC VEIN Fistula Creation First Stage;  Surgeon: Larina Earthly, MD;  Location: [facility] OR;  Service: Vascular;  Laterality: Left;   HPI:  HPI: 54 y.o. female with PMH: lupus, CKD. DM, CVA (pons discharged on  regular diet and thin 11/2014), lower GI bleed admitted with with shortness  of breath. Pt experienced acute hypoxic respiratory failure on 3/20,  requiring intubation; extubated 3/29. She required reintubation 3/30-4/4.  Bedside swallow 01/30/15 completed revealing multiple s/s aspiration ,  reduced hyolaryngeal excursion.  FEES and MBS completed recommending  continued NPO.  Admitted to CIR on 02/15/2015.  Repeat MBS ordered today to  objectively determine readiness for initiation of PO diet.    Assessment / Plan / Recommendation CHL IP CLINICAL IMPRESSIONS 02/16/2015  Dysphagia Diagnosis Moderate pharyngeal phase dysphagia;Mild oral phase  dysphagia  Clinical impression Pt presents with a  mild-moderate oropharyngeal  dysphagia with sensorimotor components.  Oral phase impairments include  generalized weakness resulting in decreased lingual manipulation of  boluses, impaired mastication of solids, and prolonged oral transit of  materials to the oropharynx.  Pt also presents with decreased base of  tongue retraction and tongue to palate contact which result in poor  posterior containment of boluses orally.  The abovementioned deficits, in  addition to decreased pharyngeal sensation resulted in premature spillage  of materials into the pharynx with swallow response delayed to the level  of the vallecula with honey thick, nectar thick, and pureed boluses.   Swallow response was triggered at the pyriforms with thin liquids and  larger boluses of nectar thick liquids.  Delayed swallow initiation, in  addition to decreased hyolaryngeal excursion resulted in penetration of  large sips of nectar thick and thin liquids as well as thin liquids via  straw.  Penetration remained above the level of the vocal cords and  cleared with cuing for strong volitional cough.  No aspiration or  penetration was visualized with ST intervention for small, controlled cup  sips across viscosities. Mild pharyngeal residue (base of tongue,  posterior pharyngeal wall, and vallecula) was noted with honey thick and  nectar thick liquids more so than with thin liquids, purees, or solid  textures.   Recommend a diet of dys 2 textures with thin liquids and full  supervision for use of the following swallowing precautions: no straws,  small bites/sips, out of bed for meals.   Pt's prognosis for advancement  good with ST follow up while inpatient for  pharyngeal strengthening  exercises, trials of advanced consistencies, and ongoing assessment of  diet toleration.  SLP initiated skilled education regarding currently  prescribed diet and rationale behind recommended swallowing precautions.         CHL IP TREATMENT RECOMMENDATION 02/10/2015   Treatment Plan Recommendations Therapy as outlined in treatment plan      CHL IP DIET RECOMMENDATION 02/16/2015  Diet Recommendations Dysphagia 2 (Fine chop);Thin liquid  Liquid Administration via Cup;No straw  Medication Administration Crushed with puree  Compensations Slow rate;Small sips/bites  Postural Changes and/or Swallow Maneuvers Out of bed for meals;Upright  30-60 min after meal;Seated upright 90 degrees     CHL IP OTHER RECOMMENDATIONS 02/16/2015     Oral Care Recommendations Oral care Q4 per protocol                                 SLP Swallow Goals  CHL IP REASON FOR REFERRAL 02/16/2015  Reason for Referral Objectively evaluate swallowing function     CHL IP ORAL PHASE 02/16/2015                    Oral Phase Impaired        Oral - Honey Teaspoon Weak lingual manipulation;Delayed oral  transit;Lingual/palatal residue;Incomplete tongue to palate contact  Oral - Honey Cup Weak lingual manipulation;Delayed oral  transit;Lingual/palatal residue;Incomplete tongue to palate contact     Oral - Nectar Teaspoon Weak lingual manipulation;Delayed oral  transit;Lingual/palatal residue;Incomplete  tongue to palate contact  Oral - Nectar Cup Weak lingual manipulation;Delayed oral  transit;Lingual/palatal residue;Incomplete tongue to palate contact           Oral - Thin Teaspoon Delayed oral transit;Weak lingual  manipulation;Incomplete tongue to palate contact  Oral - Thin Cup Weak lingual manipulation;Delayed oral transit;Incomplete  tongue to palate contact  Oral - Thin Straw Weak lingual manipulation;Incomplete tongue to palate  contact     Oral - Puree Weak lingual manipulation;Delayed oral  transit;Lingual/palatal residue;Reduced posterior propulsion     Oral - Regular Impaired mastication;Weak lingual manipulation;Reduced  posterior propulsion;Delayed oral transit;Lingual/palatal residue               CHL IP PHARYNGEAL PHASE 02/16/2015  Pharyngeal Phase Impaired               Pharyngeal - Honey Teaspoon Delayed  swallow initiation;Premature spillage  to valleculae;Reduced laryngeal elevation;Reduced tongue base  retraction;Pharyngeal residue - valleculae     Pharyngeal - Honey Cup Delayed swallow initiation;Premature spillage to  valleculae;Reduced laryngeal elevation;Reduced tongue base  retraction;Pharyngeal residue - valleculae;Penetration/Aspiration during  swallow;Reduced airway/laryngeal closure;Pharyngeal residue - posterior  pharnyx  Penetration/Aspiration details (honey cup) Material enters airway, remains  ABOVE vocal cords then ejected out        Pharyngeal - Nectar Teaspoon Premature spillage to valleculae;Reduced  laryngeal elevation;Reduced anterior laryngeal mobility;Reduced  airway/laryngeal closure;Reduced tongue base retraction;Pharyngeal residue  - valleculae;Pharyngeal residue - posterior pharnyx     Pharyngeal - Nectar Cup Delayed swallow initiation;Premature spillage to  valleculae;Premature spillage to pyriform sinuses;Reduced anterior  laryngeal mobility;Reduced laryngeal elevation;Reduced tongue base  retraction;Penetration/Aspiration during swallow;Pharyngeal residue -  valleculae;Pharyngeal residue - posterior pharnyx;Compensatory strategies  attempted (Comment)  Penetration/Aspiration details (nectar cup) Material enters airway,  remains ABOVE vocal cords and not ejected out                    Pharyngeal - Thin Teaspoon Premature spillage to valleculae;Delayed  swallow initiation;Reduced laryngeal elevation;Reduced tongue base  retraction;Reduced anterior laryngeal mobility     Pharyngeal - Thin Cup Delayed swallow initiation;Premature spillage to  valleculae;Premature spillage to pyriform sinuses;Penetration/Aspiration  during swallow;Reduced laryngeal elevation;Reduced anterior laryngeal  mobility;Reduced tongue base retraction;Compensatory strategies attempted  (Comment)  Penetration/Aspiration details (thin cup) Material enters airway, remains  ABOVE vocal cords and not ejected out   Pharyngeal - Thin Straw Delayed swallow initiation;Premature spillage to  pyriform sinuses;Reduced laryngeal elevation;Reduced tongue base  retraction;Penetration/Aspiration during swallow;Premature spillage to  valleculae;Reduced anterior laryngeal mobility;Compensatory strategies  attempted (Comment)  Penetration/Aspiration details (thin straw) Material enters airway,  remains ABOVE vocal cords and not ejected out                                            CHL IP CERVICAL ESOPHAGEAL PHASE 02/10/2015  Cervical Esophageal Phase [facility]                                                      Page, Melanee Spry 02/16/2015, 4:10 PM    Results for orders placed or performed during the hospital encounter of 02/15/15 (from the past 72 hour(s))  Glucose, capillary     Status: Abnormal   Collection Time: 02/15/15  8:01 PM  Result Value Ref Range   Glucose-Capillary 126 (H) 70 - 99 mg/dL  Glucose, capillary     Status: Abnormal   Collection Time: 02/15/15 11:55 PM  Result Value Ref Range   Glucose-Capillary 114 (H) 70 - 99 mg/dL  Glucose, capillary     Status: Abnormal   Collection Time: 02/16/15  4:19 AM  Result Value Ref Range   Glucose-Capillary 100 (H) 70 - 99 mg/dL  Glucose, capillary     Status: None   Collection Time: 02/16/15  7:59 AM  Result Value Ref Range   Glucose-Capillary 97 70 - 99 mg/dL  Glucose, capillary     Status: Abnormal   Collection Time: 02/16/15 11:44 AM  Result Value Ref Range   Glucose-Capillary 104 (H) 70 - 99 mg/dL  Glucose, capillary     Status: None   Collection Time: 02/16/15  4:29 PM  Result Value Ref Range   Glucose-Capillary 99 70 - 99 mg/dL   Comment 1 Notify RN   Glucose, capillary     Status: None   Collection Time: 02/16/15  9:11 PM  Result Value Ref Range   Glucose-Capillary 94 70 - 99 mg/dL   Comment 1 Notify RN   Glucose, capillary     Status: None   Collection Time: 02/17/15  7:03 AM  Result Value Ref Range   Glucose-Capillary 89 70 - 99 mg/dL   Comment 1  Notify RN   Glucose, capillary     Status: None   Collection Time: 02/17/15 11:28 AM  Result Value Ref Range   Glucose-Capillary 82 70 - 99 mg/dL  Glucose, capillary     Status: Abnormal   Collection Time: 02/17/15  8:10 PM  Result Value Ref Range   Glucose-Capillary 129 (H) 70 - 99 mg/dL  Glucose, capillary     Status: None   Collection Time: 02/18/15  6:38 AM  Result Value Ref Range   Glucose-Capillary 94 70 - 99 mg/dL   Vitals reviewed. Constitutional: She is oriented to person, place, and time. She appears well-developed.  Mood/affect are appropriate HENT: NG tube Left nares, voice quiet but not wet Head: Normocephalic.  Eyes: EOM are normal.  Neck: Normal range of motion. Neck supple. No thyromegaly present.  Cardiovascular: Normal rate and regular rhythm.  Respiratory: Effort normal and breath sounds normal. No respiratory distress.  GI: Soft. Bowel sounds are normal. She exhibits no distension.  Neurological: She is alert and oriented to person, place, and time.  Follows commands  Skin:  Fistula site clean and dry. Left heel dry with 4cm, reddened area. Minimally tender to touch. Is not fluctuant. Does not appear new 4 / 5 bilateral deltoid bicep tricep and grip 4/5 right hip flexors, knee extension, ADF, 3+ Left hip flexors, 4/5 Left knee ext ankle DF Sensation is intact to LT bilateral feet Assessment/Plan: 1. Functional deficits secondary to Deconditioning respiratory failure which require 3+ hours per day of interdisciplinary therapy in a comprehensive inpatient rehab setting. Physiatrist is providing close team supervision and 24 hour management of active medical problems listed below. Physiatrist and rehab team continue to assess barriers to discharge/monitor patient progress toward functional and medical goals. FIM: FIM - Bathing Bathing Steps Patient Completed: Chest, Right Arm, Right upper leg, Left upper leg, Right lower leg (including foot), Left Arm,  Abdomen, Left lower leg (including foot), Front perineal area, Buttocks Bathing: 5: Set-up assist to: Adjust water temp  FIM - Upper Body Dressing/Undressing Upper body dressing/undressing steps patient completed: Thread/unthread  right bra strap, Thread/unthread left bra strap, Thread/unthread right sleeve of pullover shirt/dresss, Thread/unthread left sleeve of pullover shirt/dress, Put head through opening of pull over shirt/dress, Pull shirt over trunk Upper body dressing/undressing: 4: Min-Patient completed 75 plus % of tasks FIM - Lower Body Dressing/Undressing Lower body dressing/undressing steps patient completed: Thread/unthread right pants leg, Thread/unthread left pants leg, Pull pants up/down, Don/Doff right shoe, Don/Doff left shoe, Fasten/unfasten right shoe, Fasten/unfasten left shoe Lower body dressing/undressing: 4: Steadying Assist  FIM - Toileting Toileting steps completed by patient: Adjust clothing prior to toileting, Performs perineal hygiene, Adjust clothing after toileting Toileting: 5: Supervision: Safety issues/verbal cues  FIM - Diplomatic Services operational officer Devices: Elevated toilet seat, Art gallery manager Transfers: 4-To toilet/BSC: Min A (steadying Pt. > 75%), 4-From toilet/BSC: Min A (steadying Pt. > 75%)  FIM - Bed/Chair Transfer Bed/Chair Transfer Assistive Devices: Arm rests Bed/Chair Transfer: 5: Supine > Sit: Supervision (verbal cues/safety issues), 5: Sit > Supine: Supervision (verbal cues/safety issues), 5: Bed > Chair or W/C: Supervision (verbal cues/safety issues), 5: Chair or W/C > Bed: Supervision (verbal cues/safety issues)  FIM - Locomotion: Wheelchair Distance: 150 Locomotion: Wheelchair: 5: Travels 150 ft or more: maneuvers on rugs and over door sills with supervision, cueing or coaxing FIM - Locomotion: Ambulation Locomotion: Ambulation Assistive Devices: Other (comment), Walker - Rolling (HHA) Ambulation/Gait Assistance: 5:  Supervision, 4: Min assist (Min A w/O RW, S w/ RW) Locomotion: Ambulation: 2: Travels 50 - 149 ft with minimal assistance (Pt.>75%)  Comprehension Comprehension Mode: Auditory Comprehension: 6-Follows complex conversation/direction: With extra time/assistive device  Expression Expression Mode: Verbal Expression: 5-Expresses basic 90% of the time/requires cueing < 10% of the time.  Social Interaction Social Interaction: 7-Interacts appropriately with others - No medications needed.  Problem Solving Problem Solving Mode: Asleep Problem Solving: 6-Solves complex problems: With extra time  Memory Memory: 6-More than reasonable amt of time  Medical Problem List and Plan: 1. Functional deficits secondary to deconditioning secondary to respiratory failure/history brainstem infarct right paramedian pontine 2. DVT Prophylaxis/Anticoagulation: Subcutaneous heparin. Monitor platelet counts and any signs of bleeding. Venous Doppler is negative 3. Pain Management: Tylenol as needed 4. Dysphagia. Nasogastric tube feeds. Follow-up speech therapy 5. Neuropsych: This patient is capable of making decisions on her own behalf. 6. Skin/Wound Care: Skin care as directed  -prevalon boot for pressure are left heel 7. Fluids/Electrolytes/Nutrition: encourage po intake 8. End-stage renal disease/SLE. Continue dialysis as per renal services 9.Hypertension/SVT. Labetalol 250 mg 3 times a day. Monitor with increased mobility 10. Acute on Chronic anemia. Follow up cbc.Aranesp weekly 11.Lupus.Cellcept/Plaquenil 12. C. difficile negative  -added probiotic for better stool consistency LOS (Days) 3 A FACE TO FACE EVALUATION WAS PERFORMED  Kaylina Cahue T 02/18/2015, 8:34 AM

## 2015-02-18 NOTE — Progress Notes (Signed)
Physical Therapy Session Note  Patient Details  Name: [name] [name] MRN: [id number] Date of Birth: [birth year]  Today's Date: 02/18/2015 PT Individual Time: 3159-4585 PT Individual Time Calculation (min): 45 min   Short Term Goals: Week 1:  PT Short Term Goal 1 (Week 1): =LTG's due to ELOS  Skilled Therapeutic Interventions/Progress Updates:    Pt received supine in bed, agreeable to participate in therapy. Session focused on general strengthening, dynamic gait/high level balance. Instructed pt in stand pivot transfer bed<>w/c w/ overall S. Pt propelled w/c to rehab gym and ambulated back to room w/ RW all with overall SBA. Instructed pt in general strengthening and endurance with Nustep 8' L5 BUE/BLE. Instructed pt in ambulating w/ no AD in hallway w/ MinA. Dynamic gait activities including horizontal and vertical head turns, side stepping, and tandem walking (pt with increased difficulty tandem walking with increased LOB). Pt tolerated session well. Session ended in pt's room, where pt was left supine in bed w/ all needs within reach.    Therapy Documentation Precautions:  Precautions Precautions: Fall Precaution Comments: NG tube Restrictions Weight Bearing Restrictions: No Pain: Pain Assessment Pain Assessment: No/denies pain  See FIM for current functional status  Therapy/Group: Individual Therapy  Rada Hay  Rada Hay, PT, DPT 02/18/2015, 7:51 AM

## 2015-02-19 ENCOUNTER — Inpatient Hospital Stay (HOSPITAL_COMMUNITY): Payer: Managed Care, Other (non HMO)

## 2015-02-19 ENCOUNTER — Inpatient Hospital Stay (HOSPITAL_COMMUNITY): Payer: Managed Care, Other (non HMO) | Admitting: Rehabilitation

## 2015-02-19 ENCOUNTER — Inpatient Hospital Stay (HOSPITAL_COMMUNITY): Payer: Managed Care, Other (non HMO) | Admitting: Speech Pathology

## 2015-02-19 ENCOUNTER — Encounter (HOSPITAL_COMMUNITY): Payer: Managed Care, Other (non HMO) | Admitting: Speech Pathology

## 2015-02-19 DIAGNOSIS — N186 End stage renal disease: Secondary | ICD-10-CM

## 2015-02-19 DIAGNOSIS — Z992 Dependence on renal dialysis: Secondary | ICD-10-CM

## 2015-02-19 LAB — GLUCOSE, CAPILLARY
GLUCOSE-CAPILLARY: 83 mg/dL (ref 70–99)
Glucose-Capillary: 73 mg/dL (ref 70–99)
Glucose-Capillary: 87 mg/dL (ref 70–99)
Glucose-Capillary: 88 mg/dL (ref 70–99)

## 2015-02-19 MED ORDER — GABAPENTIN 300 MG PO CAPS
300.0000 mg | ORAL_CAPSULE | Freq: Once | ORAL | Status: AC
  Administered 2015-02-19: 300 mg via ORAL
  Filled 2015-02-19: qty 1

## 2015-02-19 NOTE — Plan of Care (Signed)
Problem: RH SKIN INTEGRITY Goal: RH STG SKIN FREE OF INFECTION/BREAKDOWN With min assist Outcome: Not Progressing New pressure area to left heel.

## 2015-02-19 NOTE — Progress Notes (Signed)
Speech Language Pathology Daily Session Note  Patient Details  Name: [name] [name] MRN: [id number] Date of Birth: [birth year]  Today's Date: 02/19/2015 SLP Group Time: 1130-1210 SLP Group Time Calculation (min): 40 min  Short Term Goals: Week 1: SLP Short Term Goal 1 (Week 1): STG=LTG due to ELOS  Skilled Therapeutic Interventions:  Pt was seen for skilled group ST targeting dysphagia goals.  Pt was seated upright in wheelchair, awake, alert, and agreeable to participate group therapy.  SLP completed skilled observations during a trial dys 3 meal tray with thin liquids to assess readiness for diet progression.  Pt demonstrated effective mastication of solid textures over the course of an entire meal and did not appear to become fatigued with advanced textures.  Pt had x1 immediate, reflexive cough following a large cup sip thin liquids.  No other overt s/s of aspiration were noted with dys 3 textures or thin liquids and pt utilized rate/portion control with supervision cues.  Recommend a diet upgrade to dys 3 solids with continued thin liquids and full supervision for use of swallowing precautions.    FIM:  Comprehension Comprehension Mode: Auditory Comprehension: 6-Follows complex conversation/direction: With extra time/assistive device Expression Expression Mode: Verbal Expression: 5-Expresses basic 90% of the time/requires cueing < 10% of the time. Social Interaction Social Interaction: 7-Interacts appropriately with others - No medications needed. Problem Solving Problem Solving: 6-Solves complex problems: With extra time Memory Memory: 6-More than reasonable amt of time FIM - Eating Eating Activity: 5: Supervision/cues  Pain Pain Assessment Pain Assessment: No/denies pain   Therapy/Group: Dysphagia Group  Tarnesha Ulloa, Selinda Orion 02/19/2015, 3:32 PM

## 2015-02-19 NOTE — Progress Notes (Signed)
At 2045,Called to room by family, reporting feeling SOB. O2 sat 100% RA. Sister reports patient takes Xanax PRN at home for "panic attacks." Paged Dr. Naaman Plummer and orders received. Patient resting comfortably after med given. Prevalon boot applied to left foot. Foam heel protective dressing applied. Left heel with dark, mushy area. Reminded patient to make sure left heel is off all surfaces at all times. Complains of "burning" pain to bilateral feet, mostly at night. Reports taking neurontin at home. Left graft site (+) (+). Patrici Ranks A

## 2015-02-19 NOTE — Progress Notes (Signed)
Speech Language Pathology Daily Session Note  Patient Details  Name: [name] [name] MRN: [id number] Date of Birth: December 25, 1960  Today's Date: 02/19/2015 SLP Individual Time: 1443-1540 SLP Individual Time Calculation (min): 28 min  Short Term Goals: Week 1: SLP Short Term Goal 1 (Week 1): STG=LTG due to ELOS  Skilled Therapeutic Interventions:  Pt was seen for skilled ST targeting goals for dysphagia.  Upon arrival, pt was asleep in bed but easily awakened to voice and light touch.  Pt was transferred to sitting upright at edge of bed to maximize attention, alertness, and swallowing safety during structured therapeutic tasks.  SLP facilitated the session with trials of dys 3 textures to assess readiness for diet upgrade.  Pt demonstrated effective mastication of solids with complete oral clearance of residuals from the oral cavity post swallow.  Pt exhibited no overt s/s of aspiration with solids or thin liquids and was mod I for use of swallowing precautions. Will plan to follow up this afternoon for trial meal tray of dys 3 textures.   SLP also initiated skilled education regarding pharyngeal strengthening exercises targeting hyolaryngeal elevation and base of tongue retraction.   Pt returned demonstration of exercises following initial demonstration with supervision cues.  SLP left handout of exercises, including frequency, in pt's room to facilitate carryover in between therapy sessions.  Continue per current plan of care.    FIM:  Comprehension Comprehension Mode: Auditory Comprehension: 6-Follows complex conversation/direction: With extra time/assistive device Expression Expression Mode: Verbal Expression: 5-Expresses basic 90% of the time/requires cueing < 10% of the time. Social Interaction Social Interaction: 7-Interacts appropriately with others - No medications needed. Problem Solving Problem Solving: 6-Solves complex problems: With extra time Memory Memory: 6-More than reasonable  amt of time FIM - Eating Eating Activity: 5: Supervision/cues  Pain Pain Assessment Pain Assessment: No/denies pain  Therapy/Group: Individual Therapy  Devony Mcgrady, Selinda Orion 02/19/2015, 12:59 PM

## 2015-02-19 NOTE — Progress Notes (Signed)
  [facility] Progress Note   Subjective: no complaints  Filed Vitals:   02/18/15 0551 02/18/15 1358 02/18/15 2026 02/19/15 0507  BP: 159/70 158/74 167/77 152/67  Pulse: 102 101 99 102  Temp: 98.5 F (36.9 C) 98.7 F (37.1 C) 98.2 F (36.8 C) 99.1 F (37.3 C)  TempSrc: Oral Oral Oral Oral  Resp: 16 18 20 18   Weight:    55.7 kg (122 lb 12.7 oz)  SpO2: 100% 100% 100% 100%   Exam: Chronically ill appearing, no distress, calm No jvd Chest is clear bilat RRR no MRG Abd soft, NTND, +BS No LE edema Neuro is alert, nf , ox 3  HD: new start , on TTS sched here, OP schedule pending. 3.5 hrs        Assessment: 1. New ESRD / lupus nephritis - started 3/21, was CKD stage IV. Working on OP slot.  2. Anemia - reason for admission on 3/17. Now on aranesp 150/wk, Hb 7-8 range, tsat 68% on 3/4 3. Access - s/p BVT 1st stage 4/13, new IJ cath 4. 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 Phos rising  6. Debility - on rehab 7. HTN/ vol - on labet 300 bid, BP up slightly   Plan - HD tomorrow    Kelly Splinter MD  pager [phone]    cell [phone]  02/19/2015, 10:18 AM     Recent Labs Lab 02/13/15 1600 02/14/15 0525 02/15/15 0705  NA 137 137  138 137  K 4.2 4.0  3.9 3.8  CL 100 99  99 96  CO2 29 30  30 28   GLUCOSE 126* 89  89 111*  BUN 12 24*  24* 32*  CREATININE 1.90* 3.07*  3.08* 4.59*  CALCIUM 9.1 9.6  9.6 9.5  PHOS 2.2* 4.2 5.4*    Recent Labs Lab 02/13/15 1600 02/14/15 0525 02/15/15 0705  ALBUMIN 2.5* 2.6* 2.4*    Recent Labs Lab 02/13/15 0500 02/14/15 0525 02/15/15 0705  WBC 6.3 5.5 5.6  HGB 7.8* 7.8* 7.8*  HCT 24.6* 26.2* 25.6*  MCV 88.8 92.9 93.4  PLT 283 266 269   . chlorhexidine  15 mL Mouth Rinse BID  . [START ON 02/20/2015] darbepoetin (ARANESP) injection - DIALYSIS  150 mcg Intravenous Q Tue-HD  . doxercalciferol  1 mcg Intravenous Q T,Th,Sa-HD  . feeding supplement (NEPRO CARB  STEADY)  237 mL Oral BID BM  . ferrous sulfate  325 mg Oral BID WC  . folic acid  1 mg Oral Daily  . heparin  5,000 Units Subcutaneous 3 times per day  . hydroxychloroquine  200 mg Oral BID  . insulin aspart  0-9 Units Subcutaneous TID WC  . labetalol  250 mg Oral 3 times per day  . multivitamin  1 tablet Oral QHS  . mycophenolate  500 mg Oral BID  . neomycin-bacitracin-polymyxin   Topical TID  . pantoprazole sodium  40 mg Per Tube QHS  . saccharomyces boulardii  250 mg Oral BID  . thiamine  100 mg Oral Daily     acetaminophen, ALPRAZolam, heparin, hydrocortisone-pramoxine, ondansetron **OR** ondansetron (ZOFRAN) IV, oxyCODONE-acetaminophen, sorbitol

## 2015-02-19 NOTE — Progress Notes (Signed)
Subjective/Complaints: States that left heel is a little sore. No drainage noted per pt  Objective: Vital Signs: Blood pressure 152/67, pulse 102, temperature 99.1 F (37.3 C), temperature source Oral, resp. rate 18, weight 55.7 kg (122 lb 12.7 oz), last menstrual period 07/18/2012, SpO2 100 %. No results found. Results for orders placed or performed during the hospital encounter of 02/15/15 (from the past 72 hour(s))  Glucose, capillary     Status: None   Collection Time: 02/16/15  7:59 AM  Result Value Ref Range   Glucose-Capillary 97 70 - 99 mg/dL  Glucose, capillary     Status: Abnormal   Collection Time: 02/16/15 11:44 AM  Result Value Ref Range   Glucose-Capillary 104 (H) 70 - 99 mg/dL  Glucose, capillary     Status: None   Collection Time: 02/16/15  4:29 PM  Result Value Ref Range   Glucose-Capillary 99 70 - 99 mg/dL   Comment 1 Notify RN   Glucose, capillary     Status: None   Collection Time: 02/16/15  9:11 PM  Result Value Ref Range   Glucose-Capillary 94 70 - 99 mg/dL   Comment 1 Notify RN   Glucose, capillary     Status: None   Collection Time: 02/17/15  7:03 AM  Result Value Ref Range   Glucose-Capillary 89 70 - 99 mg/dL   Comment 1 Notify RN   Glucose, capillary     Status: None   Collection Time: 02/17/15 11:28 AM  Result Value Ref Range   Glucose-Capillary 82 70 - 99 mg/dL  Glucose, capillary     Status: Abnormal   Collection Time: 02/17/15  8:10 PM  Result Value Ref Range   Glucose-Capillary 129 (H) 70 - 99 mg/dL  Glucose, capillary     Status: None   Collection Time: 02/18/15  6:38 AM  Result Value Ref Range   Glucose-Capillary 94 70 - 99 mg/dL  Glucose, capillary     Status: None   Collection Time: 02/18/15 11:30 AM  Result Value Ref Range   Glucose-Capillary 89 70 - 99 mg/dL  Glucose, capillary     Status: None   Collection Time: 02/18/15  4:24 PM  Result Value Ref Range   Glucose-Capillary 81 70 - 99 mg/dL  Glucose, capillary     Status:  None   Collection Time: 02/18/15  8:42 PM  Result Value Ref Range   Glucose-Capillary 75 70 - 99 mg/dL  Glucose, capillary     Status: None   Collection Time: 02/19/15  6:38 AM  Result Value Ref Range   Glucose-Capillary 73 70 - 99 mg/dL   Vitals reviewed. Constitutional: She is oriented to person, place, and time. She appears well-developed.  Mood/affect are appropriate  Head: Normocephalic.  Eyes: EOM are normal.   Neurological:  Follows commands  Skin:  Fistula site clean and dry. Left heel dry .Not tender to touch. Is not fluctuant. Covered with foam dressing and heel boot  Assessment/Plan: 1. Functional deficits secondary to Deconditioning respiratory failure which require 3+ hours per day of interdisciplinary therapy in a comprehensive inpatient rehab setting. Physiatrist is providing close team supervision and 24 hour management of active medical problems listed below. Physiatrist and rehab team continue to assess barriers to discharge/monitor patient progress toward functional and medical goals. FIM: FIM - Bathing Bathing Steps Patient Completed: Chest, Right Arm, Right upper leg, Left upper leg, Right lower leg (including foot), Left Arm, Abdomen, Left lower leg (including foot), Front perineal area,  Buttocks Bathing: 5: Set-up assist to: Adjust water temp  FIM - Upper Body Dressing/Undressing Upper body dressing/undressing steps patient completed: Thread/unthread right bra strap, Thread/unthread left bra strap, Thread/unthread right sleeve of pullover shirt/dresss, Thread/unthread left sleeve of pullover shirt/dress, Put head through opening of pull over shirt/dress, Pull shirt over trunk Upper body dressing/undressing: 4: Min-Patient completed 75 plus % of tasks FIM - Lower Body Dressing/Undressing Lower body dressing/undressing steps patient completed: Thread/unthread right pants leg, Thread/unthread left pants leg, Pull pants up/down, Don/Doff right shoe, Don/Doff  left shoe, Fasten/unfasten right shoe, Fasten/unfasten left shoe Lower body dressing/undressing: 4: Steadying Assist  FIM - Toileting Toileting steps completed by patient: Adjust clothing prior to toileting, Performs perineal hygiene, Adjust clothing after toileting Toileting: 5: Supervision: Safety issues/verbal cues  FIM - Radio producer Devices: Elevated toilet seat, Insurance account manager Transfers: 4-To toilet/BSC: Min A (steadying Pt. > 75%), 4-From toilet/BSC: Min A (steadying Pt. > 75%)  FIM - Bed/Chair Transfer Bed/Chair Transfer Assistive Devices: Arm rests, Copy: 6: Supine > Sit: No assist, 6: Sit > Supine: No assist, 5: Chair or W/C > Bed: Supervision (verbal cues/safety issues), 5: Bed > Chair or W/C: Supervision (verbal cues/safety issues)  FIM - Locomotion: Wheelchair Distance: 150 Locomotion: Wheelchair: 5: Travels 150 ft or more: maneuvers on rugs and over door sills with supervision, cueing or coaxing FIM - Locomotion: Ambulation Locomotion: Ambulation Assistive Devices: Other (comment), Walker - Rolling (No AD) Ambulation/Gait Assistance: 5: Supervision, 4: Min assist (S w/ RW, MinA w/ no AD) Locomotion: Ambulation: 2: Travels 50 - 149 ft with minimal assistance (Pt.>75%)  Comprehension Comprehension Mode: Auditory Comprehension: 6-Follows complex conversation/direction: With extra time/assistive device  Expression Expression Mode: Verbal Expression: 5-Expresses basic 90% of the time/requires cueing < 10% of the time.  Social Interaction Social Interaction: 7-Interacts appropriately with others - No medications needed.  Problem Solving Problem Solving Mode: Asleep Problem Solving: 6-Solves complex problems: With extra time  Memory Memory: 6-More than reasonable amt of time  Medical Problem List and Plan: 1. Functional deficits secondary to deconditioning secondary to respiratory failure/history brainstem infarct  right paramedian pontine 2. DVT Prophylaxis/Anticoagulation: Subcutaneous heparin. Monitor platelet counts and any signs of bleeding. Venous Doppler is negative 3. Pain Management: Tylenol as needed 4. Dysphagia. Nasogastric tube feeds. Follow-up speech therapy 5. Neuropsych: This patient is capable of making decisions on her own behalf. 6. Skin/Wound Care: Skin care as directed  -prevalon boot for pressure are left heel 7. Fluids/Electrolytes/Nutrition: encourage po intake 8. End-stage renal disease/SLE. Continue dialysis as per renal services 9.Hypertension/SVT. Labetalol 250 mg 3 times a day. Monitor with increased mobility 10. Acute on Chronic anemia. Follow up cbc.Aranesp weekly 11.Lupus.Cellcept/Plaquenil \ LOS (Days) 4 A FACE TO FACE EVALUATION WAS PERFORMED  KIRSTEINS,ANDREW E 02/19/2015, 7:45 AM

## 2015-02-19 NOTE — Progress Notes (Signed)
Physical Therapy Session Note  Patient Details  Name: [name] [name] MRN: [id number] Date of Birth: August 26, 1961  Today's Date: 02/19/2015 PT Individual Time: 1400-1530 PT Individual Time Calculation (min): 90 min   Short Term Goals: Week 1:  PT Short Term Goal 1 (Week 1): =LTG's due to ELOS  Skilled Therapeutic Interventions/Progress Updates:   Pt received lying in bed, agreeable to therapy session.  Performed bed mobility at mod I level.  Assisted with donning shoes prior to leaving room for better protection to L heel, esp since goal of therapy session to perform gait in community and outdoor setting to assess mobility at D/C and challenge balance.  Sister present during session to observe therapy session.  Discussed with pt and sister D/C plans and perhaps D/Cing on Wednesday or Thursday.  Pt and sister verbalized understanding.  Performed >500' gait on outdoor surfaces (uneven, uphill/downhill, up/down curb, and in grassy area.  Performed all with RW at S level with cues for safety in grass as well as safety when descending hill.  Requires 2-3 seated rest breaks during gait due to fatigue, however did very well.  Performed gait in cafeteria to better simulate community re-entry and busy areas.  Did very well with cues for safety.  Discussed being active at home and in community when leaves hospital.  Also discussed energy conservation and continuing to build endurance.  Pt encouraged to ambulate outdoors (they have paved area to road) and build up distance as she continues to get stronger.  Pt verbalized understanding.  Pt ambulated back to unit all at S level.  Remainder of session worked on gait with SPC, high level balance, floor recovery and seated therex.  Performed gait with SPC at S level.  This PT feels that she could use SPC at mod I level at D/C as well.  Performed floor recovery at S level with demonstration cues on technique with education on when to perform vs when to call 911 (pt able to  verbalize 2/3 scenarios when to call 911).  Performed high level balance on compliant surfaces while tapping to cups alternating LEs.  Performed at min/guard to min A level with cues for slow controlled movement.  Ended session with seated nustep x 6 mins with BLEs only for overall strengthening and endurance.  Pt ambulated back to room with SPC at S level.  Performed standing therex at sink with B hip abd x 10 reps each and then stated needing to use restroom.  Performed all toileting tasks at S level.  Left in bed with all needs in reach.   Therapy Documentation Precautions:  Precautions Precautions: Fall Precaution Comments: NG tube Restrictions Weight Bearing Restrictions: No   Vital Signs: Therapy Vitals Temp: 98.2 F (36.8 C) Temp Source: Oral Pulse Rate: 98 Resp: 17 BP: (!) 152/74 mmHg Patient Position (if appropriate): Sitting Oxygen Therapy SpO2: 100 % O2 Device: Not Delivered Pain: Pain Assessment Pain Assessment: No/denies pain Pain Score: 4  Pain Type: Neuropathic pain Pain Location: Foot Pain Orientation: Right;Left Pain Descriptors / Indicators: Burning Pain Onset: On-going Pain Intervention(s): RN made aware  See FIM for current functional status  Therapy/Group: Individual Therapy  Denice Bors 02/19/2015, 3:10 PM

## 2015-02-19 NOTE — Progress Notes (Signed)
Occupational Therapy Session Note  Patient Details  Name: [name] [name] MRN: [id number] Date of Birth: May 12, 1961  Today's Date: 02/19/2015 OT Individual Time: 1000-1100 OT Individual Time Calculation (min): 60 min    Short Term Goals: Week 1:  OT Short Term Goal 1 (Week 1): STGs=LTGs  Skilled Therapeutic Interventions/Progress Updates:    Pt resting in bed upon arrival and agreeable to therapy. Pt declined shower this morning stating that she gets too cold after a shower.  Pt amb with RW to gather clothing prior to walking into bathroom to use toilet.  Pt completed bathing tasks with sit<>stand from w/c at sink. Pt completed dressing tasks with sit<>stand from w/c.  Pt requires extra time to complete all tasks.  Discussed home arrangement.  Pt own necessary equipment from last visit in 2015.  Focus on activity tolerance, functional amb with RW, sit<>stand, standing balance, functional transfers, discharge planning, and safety awareness.  Therapy Documentation Precautions:  Precautions Precautions: Fall Precaution Comments: NG tube Restrictions Weight Bearing Restrictions: No  Pain: Pain Assessment Pain Assessment: 0-10 Pain Score: 4  Pain Type: Neuropathic pain Pain Location: Foot Pain Orientation: Right;Left Pain Descriptors / Indicators: Burning Pain Onset: On-going Pain Intervention(s): RN made aware  See FIM for current functional status  Therapy/Group: Individual Therapy  Leroy Libman 02/19/2015, 12:40 PM

## 2015-02-20 ENCOUNTER — Inpatient Hospital Stay (HOSPITAL_COMMUNITY): Payer: Managed Care, Other (non HMO) | Admitting: Speech Pathology

## 2015-02-20 ENCOUNTER — Inpatient Hospital Stay (HOSPITAL_COMMUNITY): Payer: Managed Care, Other (non HMO) | Admitting: Occupational Therapy

## 2015-02-20 ENCOUNTER — Inpatient Hospital Stay (HOSPITAL_COMMUNITY): Payer: Managed Care, Other (non HMO) | Admitting: Rehabilitation

## 2015-02-20 ENCOUNTER — Inpatient Hospital Stay (HOSPITAL_COMMUNITY): Payer: Managed Care, Other (non HMO) | Admitting: Physical Therapy

## 2015-02-20 DIAGNOSIS — M329 Systemic lupus erythematosus, unspecified: Secondary | ICD-10-CM

## 2015-02-20 DIAGNOSIS — Z8673 Personal history of transient ischemic attack (TIA), and cerebral infarction without residual deficits: Secondary | ICD-10-CM

## 2015-02-20 LAB — CBC WITH DIFFERENTIAL/PLATELET
BASOS ABS: 0 10*3/uL (ref 0.0–0.1)
BASOS PCT: 0 % (ref 0–1)
EOS ABS: 0.1 10*3/uL (ref 0.0–0.7)
Eosinophils Relative: 1 % (ref 0–5)
HCT: 25 % — ABNORMAL LOW (ref 36.0–46.0)
HEMOGLOBIN: 7.8 g/dL — AB (ref 12.0–15.0)
Lymphocytes Relative: 19 % (ref 12–46)
Lymphs Abs: 0.8 10*3/uL (ref 0.7–4.0)
MCH: 28 pg (ref 26.0–34.0)
MCHC: 31.2 g/dL (ref 30.0–36.0)
MCV: 89.6 fL (ref 78.0–100.0)
MONO ABS: 0.6 10*3/uL (ref 0.1–1.0)
Monocytes Relative: 13 % — ABNORMAL HIGH (ref 3–12)
NEUTROS ABS: 2.8 10*3/uL (ref 1.7–7.7)
NEUTROS PCT: 67 % (ref 43–77)
Platelets: 273 10*3/uL (ref 150–400)
RBC: 2.79 MIL/uL — ABNORMAL LOW (ref 3.87–5.11)
RDW: 16.8 % — ABNORMAL HIGH (ref 11.5–15.5)
WBC: 4.3 10*3/uL (ref 4.0–10.5)

## 2015-02-20 LAB — RENAL FUNCTION PANEL
ANION GAP: 15 (ref 5–15)
Albumin: 2.4 g/dL — ABNORMAL LOW (ref 3.5–5.2)
BUN: 32 mg/dL — AB (ref 6–23)
CHLORIDE: 94 mmol/L — AB (ref 96–112)
CO2: 24 mmol/L (ref 19–32)
Calcium: 8.8 mg/dL (ref 8.4–10.5)
Creatinine, Ser: 5.9 mg/dL — ABNORMAL HIGH (ref 0.50–1.10)
GFR calc non Af Amer: 7 mL/min — ABNORMAL LOW (ref >90)
GFR, EST AFRICAN AMERICAN: 9 mL/min — AB (ref >90)
GLUCOSE: 69 mg/dL — AB (ref 70–99)
POTASSIUM: 5.4 mmol/L — AB (ref 3.5–5.1)
Phosphorus: 6.6 mg/dL — ABNORMAL HIGH (ref 2.3–4.6)
Sodium: 133 mmol/L — ABNORMAL LOW (ref 135–145)

## 2015-02-20 LAB — GLUCOSE, CAPILLARY
Glucose-Capillary: 79 mg/dL (ref 70–99)
Glucose-Capillary: 78 mg/dL (ref 70–99)

## 2015-02-20 MED ORDER — GABAPENTIN 100 MG PO CAPS
100.0000 mg | ORAL_CAPSULE | Freq: Three times a day (TID) | ORAL | Status: DC
  Administered 2015-02-20 – 2015-02-21 (×2): 100 mg via ORAL
  Filled 2015-02-20 (×6): qty 1

## 2015-02-20 MED ORDER — HEPARIN SODIUM (PORCINE) 1000 UNIT/ML DIALYSIS: 1000.0000 [IU] | INTRAMUSCULAR | Status: DC | PRN

## 2015-02-20 MED ORDER — LIDOCAINE-PRILOCAINE 2.5-2.5 % EX CREA
1.0000 "application " | TOPICAL_CREAM | CUTANEOUS | Status: DC | PRN
  Filled 2015-02-20: qty 5

## 2015-02-20 MED ORDER — PENTAFLUOROPROP-TETRAFLUOROETH EX AERO: 1.0000 "application " | INHALATION_SPRAY | CUTANEOUS | Status: DC | PRN

## 2015-02-20 MED ORDER — SODIUM CHLORIDE 0.9 % IV SOLN: 100.0000 mL | INTRAVENOUS | Status: DC | PRN

## 2015-02-20 MED ORDER — DOXERCALCIFEROL 4 MCG/2ML IV SOLN
INTRAVENOUS | Status: AC
  Administered 2015-02-20: 1 ug via INTRAVENOUS
  Filled 2015-02-20: qty 2

## 2015-02-20 MED ORDER — HEPARIN SODIUM (PORCINE) 1000 UNIT/ML DIALYSIS: 2000.0000 [IU] | INTRAMUSCULAR | Status: DC | PRN

## 2015-02-20 MED ORDER — NEPRO/CARBSTEADY PO LIQD
237.0000 mL | ORAL | Status: DC | PRN
  Filled 2015-02-20: qty 237

## 2015-02-20 MED ORDER — LIDOCAINE HCL (PF) 1 % IJ SOLN: 5.0000 mL | INTRAMUSCULAR | Status: DC | PRN

## 2015-02-20 MED ORDER — ALTEPLASE 2 MG IJ SOLR
2.0000 mg | Freq: Once | INTRAMUSCULAR | Status: DC | PRN
  Filled 2015-02-20: qty 2

## 2015-02-20 MED ORDER — DARBEPOETIN ALFA 150 MCG/0.3ML IJ SOSY
PREFILLED_SYRINGE | INTRAMUSCULAR | Status: AC
  Administered 2015-02-20: 150 ug via INTRAVENOUS
  Filled 2015-02-20: qty 0.3

## 2015-02-20 NOTE — Progress Notes (Signed)
02/20/2015 12:58 PM Hemodialysis Outpatient Note; this patient has been accepted at the [facility] on a Tuesday, Thursday and Saturday 2nd shift schedule. If the patient can visit the center on Friday April 22nd, at 2:30 pm to sign her consent and paperwork then she can plan for her first outpatient treatment to begin on Saturday at a time to be determined by the center. If the patient can not get to the center on Friday then she can plan for treatment to commence on Tuesday April 26th. Thank you. Gordy Savers

## 2015-02-20 NOTE — Progress Notes (Signed)
Subjective:   No complaints, tolerating dialysis well  Objective: Vital signs in last 24 hours: Temp:  [98.1 F (36.7 C)-98.2 F (36.8 C)] 98.1 F (36.7 C) (04/19 0500) Pulse Rate:  [98-105] 105 (04/19 0631) Resp:  [17] 17 (04/19 0500) BP: (146-183)/(74-82) 160/79 mmHg (04/19 0631) SpO2:  [100 %] 100 % (04/19 0500) Weight:  [58.6 kg (129 lb 3 oz)] 58.6 kg (129 lb 3 oz) (04/19 0500) Weight change: 2.9 kg (6 lb 6.3 oz)  Intake/Output from previous day: 04/18 0701 - 04/19 0700 In: 360 [P.O.:360] Out: -  Intake/Output this shift: Total I/O In: 120 [P.O.:120] Out: -   Lab Results: No results for input(s): WBC, HGB, HCT, PLT in the last 72 hours. BMET: No results for input(s): NA, K, CL, CO2, GLUCOSE, BUN, CREATININE, CALCIUM, ALBUMIN in the last 72 hours.  Invalid input(s): PHOSPHORUS No results for input(s): PTH in the last 72 hours. Iron Studies: No results for input(s): IRON, TIBC, TRANSFERRIN, FERRITIN in the last 72 hours.  Studies/Results: No results found.  EXAM: General appearance:  Alert, in no apparent distress Resp:  CTA without rales, rhonchi, or wheezes Cardio:  RRR without murmur or rub GI:  + BS, soft and nontender Extremities:  No edema Access:  R IJ catheter, AVF @ LUA with + bruit  HD: new start , on TTS sched here, OP schedule pending. 3.5 hrs  Assessment/Plan: 1. New ESRD - sec to lupus nephritis, started HD 3/21. 2. Dialysis access - using R IJ catheter, L 1st stage BVT placed 4/13 by Dr. Donnetta Hutching. 3. Anemia - reason for admission, Hgb 7.8, on Aranesp 150 mcg on Tues; Fe sat 68%, ferritin 1206 on 3/4.  DC PO Fe SO4. 4. HTN/Volume - BP 160/79 on Labetalol 250 mg q8h; wt 58.6 kg s/p net UF 1 L yesterday.  5. Sec HPT - Ca 9.5 (10.8 corrected), P 5.4, iPTH 1171 on 3/22; Hectorol 1 mcg, no binders. 6. Nutrition - Alb 2.4, diet Dys 3, vitamin. 7. SLE - tapering Cellcept (now 500 mg bid), Plaquenil.  8. Debility - in rehab.    LOS: 5 days    LYLES,CHARLES 02/20/2015,11:07 AM  Pt seen, examined and agree w A/P as above. I have d/w Dr [name] pt's nephrologist, Cellcept no longer needed as was specifically used for renal lupus disease and now that she is on dialysis this is no longer needed.  Will dc Kelly Splinter MD pager [phone]    cell [phone] 02/20/2015, 12:26 PM

## 2015-02-20 NOTE — Progress Notes (Signed)
Social Work Patient ID: [name] [name], female   DOB: 1961-10-08, 54 y.o.   MRN: [id number] Spoke with Bethena Roys from HD she will have a schedule for pt sometime today. Will start on Sat if discharged from hospital Thurs.but will need to go by the center to complete paperwork prior to Sat. Will work on discharge plans.

## 2015-02-20 NOTE — Progress Notes (Signed)
Physical Therapy Session Note  Patient Details  Name: [name] [name] MRN: [id number] Date of Birth: 1960-11-29  Today's Date: 02/20/2015 PT Individual Time: 0800-0909 PT Individual Time Calculation (min): 69 min   Short Term Goals: Week 1:  PT Short Term Goal 1 (Week 1): =LTG's due to ELOS   Therapy Documentation Precautions:  Precautions Precautions: Fall Precaution Comments: NG tube Restrictions Weight Bearing Restrictions: No   Mat Mobility:  Supine to and from short sit edge of mat with modified independent    Transfers: Sit to and from stand transfer with single point cane modified independent Stand pivot transfer with single point cane modified independent   Ambulation: Patient ambulated 155 feet and 300 feetx2 with single point cane supervision. Verbal cues for pacing. Patient ambulated with step through gait pattern. Verbal cues for increased step length. Patient demonstrates uneven step and stride length with a varying cadence.   Patient performed forward and backward walking without assistive device 50 feetx4 with supervision and multiple episodes of LOB posteriorly requiring min assist to recover.  Patient performed obstacle course for four trials alternating between negotiating cone to the left and right and stepping over cones. Close supervision with verbal cues provided for pacing and safety. One episode of min assist when steeping over a cone. Patient utilized single point cane.    Stair negotiation: Patient negotiated 16 steps with right handrail and single point cane supervision. Patient performed stairs with a step to pattern. Patient educated on proper sequence and technique and was able to return. Verbal cues for hand placement on rail during descent.   Standing ther ex: Heel raises 30x with min assist  Mini squats 30x with min assist  Supine the ex: Bridging 30x with a 5 second hold  NU-Step 8 minutes  Patient tolerated treatment well. Patient  was without pain at end of session. Patient tolerated session with rest breaks throughout. Patient returned to room at end of session to bed with bead alarm engaged. Call bell within reach and patient educated not to be up without assistance. Patient verbalized understanding.   Therapy/Group: Individual Therapy  Retta Diones 02/20/2015, 9:17 AM

## 2015-02-20 NOTE — Progress Notes (Signed)
Subjective/Complaints: No issues overnite,  Tolerating therapy HD today.  No heel pain, no joint pain or swelling   Review of Systems - Negative except feels tired  Objective: Vital Signs: Blood pressure 160/79, pulse 105, temperature 98.1 F (36.7 C), temperature source Oral, resp. rate 17, weight 58.6 kg (129 lb 3 oz), last menstrual period 07/18/2012, SpO2 100 %. No results found. Results for orders placed or performed during the hospital encounter of 02/15/15 (from the past 72 hour(s))  Glucose, capillary     Status: None   Collection Time: 02/17/15 11:28 AM  Result Value Ref Range   Glucose-Capillary 82 70 - 99 mg/dL  Glucose, capillary     Status: Abnormal   Collection Time: 02/17/15  8:10 PM  Result Value Ref Range   Glucose-Capillary 129 (H) 70 - 99 mg/dL  Glucose, capillary     Status: None   Collection Time: 02/18/15  6:38 AM  Result Value Ref Range   Glucose-Capillary 94 70 - 99 mg/dL  Glucose, capillary     Status: None   Collection Time: 02/18/15 11:30 AM  Result Value Ref Range   Glucose-Capillary 89 70 - 99 mg/dL  Glucose, capillary     Status: None   Collection Time: 02/18/15  4:24 PM  Result Value Ref Range   Glucose-Capillary 81 70 - 99 mg/dL  Glucose, capillary     Status: None   Collection Time: 02/18/15  8:42 PM  Result Value Ref Range   Glucose-Capillary 75 70 - 99 mg/dL  Glucose, capillary     Status: None   Collection Time: 02/19/15  6:38 AM  Result Value Ref Range   Glucose-Capillary 73 70 - 99 mg/dL  Glucose, capillary     Status: None   Collection Time: 02/19/15 11:18 AM  Result Value Ref Range   Glucose-Capillary 87 70 - 99 mg/dL   Comment 1 Notify RN   Glucose, capillary     Status: None   Collection Time: 02/19/15  4:51 PM  Result Value Ref Range   Glucose-Capillary 88 70 - 99 mg/dL   Comment 1 Notify RN   Glucose, capillary     Status: None   Collection Time: 02/19/15  8:51 PM  Result Value Ref Range   Glucose-Capillary 83 70 -  99 mg/dL   Comment 1 Notify RN   Glucose, capillary     Status: None   Collection Time: 02/20/15  7:12 AM  Result Value Ref Range   Glucose-Capillary 79 70 - 99 mg/dL   Comment 1 Notify RN    Vitals reviewed. Constitutional: She is oriented to person, place, and time. She appears well-developed.  Mood/affect are appropriate  Head: Normocephalic.  Eyes: EOM are normal.  Lungs:  Clear to auscultation Cor RRR, no murmurs Neurological:  Follows commands  Skin:  Fistula site clean and dry. Left heel dry .Not tender to touch. Is not fluctuant. Covered with foam dressing and heel boot  Assessment/Plan: 1. Functional deficits secondary to Deconditioning respiratory failure which require 3+ hours per day of interdisciplinary therapy in a comprehensive inpatient rehab setting. Physiatrist is providing close team supervision and 24 hour management of active medical problems listed below. Physiatrist and rehab team continue to assess barriers to discharge/monitor patient progress toward functional and medical goals. FIM: FIM - Bathing Bathing Steps Patient Completed: Chest, Right Arm, Right upper leg, Left upper leg, Right lower leg (including foot), Left Arm, Abdomen, Left lower leg (including foot), Front perineal area, Buttocks Bathing:  5: Supervision: Safety issues/verbal cues  FIM - Upper Body Dressing/Undressing Upper body dressing/undressing steps patient completed: Thread/unthread right bra strap, Thread/unthread left bra strap, Thread/unthread right sleeve of pullover shirt/dresss, Thread/unthread left sleeve of pullover shirt/dress, Put head through opening of pull over shirt/dress, Pull shirt over trunk Upper body dressing/undressing: 5: Supervision: Safety issues/verbal cues FIM - Lower Body Dressing/Undressing Lower body dressing/undressing steps patient completed: Thread/unthread right pants leg, Thread/unthread left pants leg, Pull pants up/down, Don/Doff right shoe, Don/Doff  left shoe, Fasten/unfasten right shoe, Fasten/unfasten left shoe Lower body dressing/undressing: 5: Supervision: Safety issues/verbal cues  FIM - Toileting Toileting steps completed by patient: Adjust clothing prior to toileting, Performs perineal hygiene, Adjust clothing after toileting Toileting: 5: Supervision: Safety issues/verbal cues  FIM - Radio producer Devices: Elevated toilet seat, Oncologist Transfers: 5-To toilet/BSC: Supervision (verbal cues/safety issues), 5-From toilet/BSC: Supervision (verbal cues/safety issues)  FIM - Control and instrumentation engineer Devices: Arm rests, Copy: 6: Supine > Sit: No assist, 6: Sit > Supine: No assist  FIM - Locomotion: Wheelchair Distance: 150 Locomotion: Wheelchair: 0: Activity did not occur FIM - Locomotion: Ambulation Locomotion: Ambulation Assistive Devices: Journalist, newspaper, Environmental consultant - Rolling Ambulation/Gait Assistance: 5: Supervision Locomotion: Ambulation: 5: Travels 150 ft or more with supervision/safety issues  Comprehension Comprehension Mode: Auditory Comprehension: 6-Follows complex conversation/direction: With extra time/assistive device  Expression Expression Mode: Verbal Expression: 5-Expresses complex 90% of the time/cues < 10% of the time  Social Interaction Social Interaction: 7-Interacts appropriately with others - No medications needed.  Problem Solving Problem Solving Mode: Asleep Problem Solving: 6-Solves complex problems: With extra time  Memory Memory: 6-Assistive device: No helper  Medical Problem List and Plan: 1. Functional deficits secondary to deconditioning secondary to respiratory failure/history brainstem infarct right paramedian pontine 2. DVT Prophylaxis/Anticoagulation: Subcutaneous heparin. Monitor platelet counts and any signs of bleeding. Venous Doppler is negative 3. Pain Management: Tylenol as needed 4. Dysphagia.  Nasogastric tube feeds. Follow-up speech therapy 5. Neuropsych: This patient is capable of making decisions on her own behalf. 6. Skin/Wound Care: Skin care as directed  -prevalon boot for pressure are left heel 7. Fluids/Electrolytes/Nutrition: encourage po intake 8. End-stage renal disease/SLE. Continue dialysis as per renal services 9.Hypertension/SVT. Labetalol 250 mg 3 times a day. Monitor with increased mobility 10. Acute on Chronic anemia. Follow up cbc.Aranesp weekly 11.Lupus.Cellcept/Plaquenil \ LOS (Days) 5 A FACE TO FACE EVALUATION WAS PERFORMED  KIRSTEINS,ANDREW E 02/20/2015, 7:33 AM

## 2015-02-20 NOTE — Progress Notes (Signed)
Physical Therapy Session Note  Patient Details  Name: [name] [name] MRN: [id number] Date of Birth: 1961/05/10  Today's Date: 02/20/2015 PT Individual Time: 1500-1600 PT Individual Time Calculation (min): 60 min   Short Term Goals: Week 1:  PT Short Term Goal 1 (Week 1): =LTG's due to ELOS  Skilled Therapeutic Interventions/Progress Updates:   Pt received lying in bed, agreeable to therapy session.  Note that pt with increased L heel pain and neuropathic pain in feet as well.  RN made aware and asked whether we could do anything to modify shoe or add pressure relief to prevent further breakdown.  RN to provide pt with pain medication while PT added foam dressing to shoe to add increased cushion to prevent breakdown.  Note that BP was 156/95 prior to session, RN provided BP meds as well as stated that it is higher due to needing dialysis today.  Pt ambulated to/from therapy gym x 150' x 2 reps with RW (due to increased pain in heel) at S level.  Once in therapy gym, got into supine position and performed supine therex (due to increased pain in heel) as follows; SLR BLE x 10 reps, BLE SL hip abd x 10reps, BLE bridging x 10 reps, alternating R then LLE only bridging x 10 reps.  While seated at [facility] performed seated LAQs w/ 2lb ankle weight BLE x 10 reps and seated marching with 2lb weight x 10 reps each.  Progressed to seated nustep x 8 mins at level 3 resistance with BLEs only for increased BLE strength and overall endurance.  Tolerated well without rest break.  Ended session with high level balance tossing ball while on airdex foam pad feet apart>feet together and progressing to standing on rocker board vertically tossing ball to rebounder.  Requires min/guard to min A for task with only single LOB during task requiring mod to correct.  Ambulated back to room with pt stating need to use restroom.  Pt performed all toileting at mod I level.  Returned to bed with all needs in reach and prevalon boot donned.    Therapy Documentation Precautions:  Precautions Precautions: Fall Precaution Comments: NG tube Restrictions Weight Bearing Restrictions: No   Vital Signs: Therapy Vitals Temp: 98.1 F (36.7 C) Temp Source: Oral Pulse Rate: (!) 111 Resp: 17 BP: (!) 198/96 mmHg (RN [employer] notified) Patient Position (if appropriate): Lying Oxygen Therapy SpO2: 100 % O2 Device: Not Delivered Pain: Pain Assessment Pain Assessment: No/denies pain  See FIM for current functional status  Therapy/Group: Individual Therapy  Denice Bors 02/20/2015, 3:19 PM

## 2015-02-20 NOTE — Progress Notes (Signed)
Occupational Therapy Session Note  Patient Details  Name: [name] [name] MRN: [id number] Date of Birth: 1961-02-07  Today's Date: 02/20/2015 OT Individual Time: 1000-1100 OT Individual Time Calculation (min): 60 min    Short Term Goals: Week 1:  OT Short Term Goal 1 (Week 1): STGs=LTGs  Skilled Therapeutic Interventions/Progress Updates:    1:1 self care retraining at sink level per pt request. Focus on safe functional ambulation around room to gather items needed for self care with [facility] with distant supervision to mod I. Pt exhibited safe behaviors with locking brakes on w/c and using can appropriately. Pt performing toilet transfer and toileting mod I with extra time. Assisted pt with putting barrier cream at end of session and noticed pressure Ulser near anus. RN called in and assess situation. Pt left laying in bed to rest before next therapy.  Therapy Documentation Precautions:  Precautions Precautions: Fall Precaution Comments: NG tube Restrictions Weight Bearing Restrictions: No Pain: Pain Assessment Pain Assessment: No/denies pain  See FIM for current functional status  Therapy/Group: Individual Therapy  Willeen Cass [facility] 02/20/2015, 3:54 PM

## 2015-02-20 NOTE — Progress Notes (Signed)
Speech Language Pathology Daily Session Note  Patient Details  Name: [name] [name] MRN: [id number] Date of Birth: 12/09/1960  Today's Date: 02/20/2015 SLP Individual Time: 1200-1230 SLP Individual Time Calculation (min): 30 min  Short Term Goals: Week 1: SLP Short Term Goal 1 (Week 1): STG=LTG due to ELOS  Skilled Therapeutic Interventions: Skilled treatment session focused on dysphagia goals. SLP facilitated session by providing skilled observation with patient's lunch meal of Dys. 3 (soft) textures with thin liquids. Patient demonstrated efficient mastication across consistencies.  Minimal  s/s of aspiration observed characterized by change in voice quality to hoarse/raspy. Cued throat clear effectively returned voice quality to baseline. RN wa notified of this, and indicated RN/CNA had awakened pt just prior to SLP entering room.  Pt was noted to be independent for use of swallow strategies. Will decrease level of supervision during meals to intermittent. Patient left in wheelchair with all needs within reach. Continue with current plan of care.   FIM:  Comprehension Comprehension Mode: Auditory Comprehension: 6-Follows complex conversation/direction: With extra time/assistive device Expression Expression Mode: Verbal Expression: 5-Expresses basic 90% of the time/requires cueing < 10% of the time. Social Interaction Social Interaction: 7-Interacts appropriately with others - No medications needed. Problem Solving Problem Solving: 6-Solves complex problems: With extra time Memory Memory: 6-More than reasonable amt of time FIM - Eating Eating Activity: 5: Supervision/cues  Pain Pain Assessment Pain Assessment: No/denies pain Pain Score: 5  Pain Type: Acute pain Pain Location: Heel Pain Orientation: Left Pain Frequency: Intermittent Pain Onset: Gradual Patients Stated Pain Goal: 3 Pain Intervention(s): Emotional support  Therapy/Group: Individual Therapy   [name] Sanko B.  [name], [facility], [name] [phone]  Shonna Chock 02/20/2015, 4:25 PM

## 2015-02-21 ENCOUNTER — Inpatient Hospital Stay (HOSPITAL_COMMUNITY): Payer: Managed Care, Other (non HMO) | Admitting: Occupational Therapy

## 2015-02-21 ENCOUNTER — Inpatient Hospital Stay (HOSPITAL_COMMUNITY): Payer: Managed Care, Other (non HMO) | Admitting: Speech Pathology

## 2015-02-21 ENCOUNTER — Inpatient Hospital Stay (HOSPITAL_COMMUNITY): Payer: Managed Care, Other (non HMO) | Admitting: Rehabilitation

## 2015-02-21 DIAGNOSIS — Z8673 Personal history of transient ischemic attack (TIA), and cerebral infarction without residual deficits: Secondary | ICD-10-CM

## 2015-02-21 LAB — GLUCOSE, CAPILLARY
GLUCOSE-CAPILLARY: 90 mg/dL (ref 70–99)
Glucose-Capillary: 104 mg/dL — ABNORMAL HIGH (ref 70–99)
Glucose-Capillary: 87 mg/dL (ref 70–99)
Glucose-Capillary: 100 mg/dL — ABNORMAL HIGH (ref 70–99)
Glucose-Capillary: 78 mg/dL (ref 70–99)

## 2015-02-21 MED ORDER — PANTOPRAZOLE SODIUM 40 MG PO TBEC
40.0000 mg | DELAYED_RELEASE_TABLET | Freq: Every day | ORAL | Status: DC
  Administered 2015-02-21: 40 mg via ORAL
  Filled 2015-02-21 (×2): qty 1

## 2015-02-21 MED ORDER — SEVELAMER CARBONATE 800 MG PO TABS
800.0000 mg | ORAL_TABLET | Freq: Three times a day (TID) | ORAL | Status: DC
  Administered 2015-02-21 – 2015-02-22 (×3): 800 mg via ORAL
  Filled 2015-02-21 (×6): qty 1

## 2015-02-21 MED ORDER — PREGABALIN 25 MG PO CAPS
75.0000 mg | ORAL_CAPSULE | Freq: Every day | ORAL | Status: DC
  Administered 2015-02-21 – 2015-02-22 (×2): 75 mg via ORAL
  Filled 2015-02-21 (×2): qty 3

## 2015-02-21 NOTE — Progress Notes (Signed)
Occupational Therapy Discharge Summary  Patient Details  Name: [name] [name] MRN: [id number] Date of Birth: 1961/08/03  Today's Date: 02/21/2015 OT Individual Time: 1015-1108 and 1430-1530 OT Individual Time Calculation (min): 53 min and 60 min    Patient has met 9 of 9 long term goals due to improved activity tolerance, improved balance, postural control, ability to compensate for deficits and improved coordination.  Patient to discharge at overall Modified Independent level.  Patient's care partner is independent to provide the necessary intermittent assistance at discharge.    Reasons goals not met: all goals met.  Recommendation:  Patient will benefit from ongoing skilled OT services in outpatient setting to continue to advance functional skills in the area of BADL.  Equipment: Pt owns all needed equipment  Reasons for discharge: treatment goals met and discharge from hospital  Patient/family agrees with progress made and goals achieved: Yes   OT Intervention : Session 1: Upon entering the room, pt with 4/10 pain in L ankle with RN notified. Pt declining shower this session and wishing to do later in afternoon. OT propelled pt down to gift shop for energy conservation via wheelchair. Pt ambulated in gift shop with [facility] by navigating aisles, reaching up, down, and laterally to obtain items to look at and then return. Pt also picking up and paying for small purchase at counter. Pt ambulated for ~ 25 minutes without rest break and Mod I with use of SPC. Pt requiring rest break after making purchases and returned to wheelchair. Pt propelled via wheelchair back to room where family members present. Pt seated on bed with son present in room and call bell within reach upon exiting the room.   Session 2: Upon entering the room, pt seated on EOB with no c/o pain this session. Pt ambulating in room to obtain all needed items for shower this session. Shower guard placed over dialysis site in order  to decrease risk of shower. Pt ambulating with SPC to obtain all items with Mod I and required Supervision for transferring in and out of shower for safety. Pt seated on shower chair for bathing tasks at shower level. Pt returning to sit on bed for dressing tasks with Mod I. Pt then cleaning, putting away items, and packing clothing for tomorrows discharge with Mod I and use of SPC. Pt seated on EOB with call bell and all needed items within the room upon exiting.   OT Discharge Precautions/Restrictions  Precautions Precautions: Fall Restrictions Weight Bearing Restrictions: No  Pain Pain Assessment Pain Assessment: 0-10 Pain Score: 4  Pain Type: Acute pain Pain Location: Foot Pain Orientation: Right;Left Pain Descriptors / Indicators: Tingling Pain Onset: Gradual Pain Intervention(s): RN made aware Multiple Pain Sites: No Cognition Overall Cognitive Status: Within Functional Limits for tasks assessed Arousal/Alertness: Awake/alert Orientation Level: Oriented X4 Attention: Alternating;Divided Selective Attention: Appears intact Alternating Attention: Appears intact Divided Attention: Appears intact Memory: Appears intact Awareness: Appears intact Safety/Judgment: Appears intact Sensation Sensation Light Touch: Appears Intact Stereognosis: Not tested Hot/Cold: Not tested Proprioception: Appears Intact Coordination Gross Motor Movements are Fluid and Coordinated: Yes Fine Motor Movements are Fluid and Coordinated: Yes Heel Shin Test: [facility] Motor  Motor Motor: Other (comment) Motor - Discharge Observations: decreased balance Mobility  Bed Mobility Bed Mobility: Supine to Sit;Sit to Supine Supine to Sit: 6: Modified independent (Device/Increase time) Sit to Supine: 6: Modified independent (Device/Increase time) Transfers Sit to Stand: 6: Modified independent (Device/Increase time) Stand to Sit: 6: Modified independent (Device/Increase time)  Trunk/Postural  Assessment   Cervical Assessment Cervical Assessment: Within Functional Limits Thoracic Assessment Thoracic Assessment: Within Functional Limits Lumbar Assessment Lumbar Assessment: Within Functional Limits Postural Control Postural Control: Deficits on evaluation Protective Responses: delayed stepping strategy however improved from eval.   Balance Balance Balance Assessed: Yes Standardized Balance Assessment Standardized Balance Assessment: Berg Balance Test;Timed Up and Go Test Berg Balance Test Sit to Stand: Able to stand  independently using hands Standing Unsupported: Able to stand safely 2 minutes Sitting with Back Unsupported but Feet Supported on Floor or Stool: Able to sit safely and securely 2 minutes Stand to Sit: Controls descent by using hands Transfers: Able to transfer safely, definite need of hands Standing Unsupported with Eyes Closed: Able to stand 10 seconds safely Standing Ubsupported with Feet Together: Able to place feet together independently and stand 1 minute safely From Standing, Reach Forward with Outstretched Arm: Can reach confidently >25 cm (10") From Standing Position, Pick up Object from Floor: Able to pick up shoe safely and easily From Standing Position, Turn to Look Behind Over each Shoulder: Looks behind from both sides and weight shifts well Turn 360 Degrees: Able to turn 360 degrees safely but slowly Standing Unsupported, Alternately Place Feet on Step/Stool: Able to complete 4 steps without aid or supervision Standing Unsupported, One Foot in Front: Able to place foot tandem independently and hold 30 seconds Standing on One Leg: Tries to lift leg/unable to hold 3 seconds but remains standing independently Total Score: 46 Timed Up and Go Test TUG: Normal TUG Normal TUG (seconds): 18.1 (avg of three trials) Dynamic Sitting Balance Dynamic Sitting - Balance Support: Feet supported Dynamic Sitting - Level of Assistance: 7: Independent Static Standing  Balance Static Standing - Balance Support: During functional activity Static Standing - Level of Assistance: 6: Modified independent (Device/Increase time) Dynamic Standing Balance Dynamic Standing - Balance Support: During functional activity Dynamic Standing - Level of Assistance: 6: Modified independent (Device/Increase time) Extremity/Trunk Assessment RUE Assessment RUE Assessment: Within Functional Limits LUE Assessment LUE Assessment: Within Functional Limits  See FIM for current functional status  Phineas Semen 02/21/2015, 11:38 AM

## 2015-02-21 NOTE — Progress Notes (Signed)
Speech Language Pathology Daily Session Note  Patient Details  Name: [name] [name] MRN: [id number] Date of Birth: [birth year]  Today's Date: 02/21/2015 SLP Individual Time: 5038-8828 SLP Individual Time Calculation (min): 30 min  Short Term Goals: Week 1: SLP Short Term Goal 1 (Week 1): STG=LTG due to ELOS  Skilled Therapeutic Interventions: Skilled treatment session focused on dysphagia goals. Student facilitated session by providing skilled observation of patient consumption of trials of regualr textures, which patient consumed with intermittent overt s/s of aspiration characterized by intermittent immediate and delayed throat clears and coughs. However, per MBS performed on 04/15, patient with coughing on all textures in the absence of aspiration, therefore difficult to differentiate possible overt s/s of aspiration from patient baseline. Patient demonstrated efficient oral clearance and mastication of regular textures and was noted to independently utilize swallow strategies, therefore recommend diet upgrade to regular textures. Patient left in room with all needs within reach. Continue with current plan of care.   FIM:  Comprehension Comprehension Mode: Auditory Comprehension: 6-Follows complex conversation/direction: With extra time/assistive device Expression Expression Mode: Verbal Expression: 5-Expresses basic needs/ideas: With no assist Social Interaction Social Interaction: 6-Interacts appropriately with others with medication or extra time (anti-anxiety, antidepressant). Problem Solving Problem Solving: 6-Solves complex problems: With extra time Memory Memory: 6-More than reasonable amt of time FIM - Eating Eating Activity: 6: Modified consistency diet: (comment);6: Swallowing techniques: self-managed  Pain Pain Assessment Pain Assessment: No/denies pain  Therapy/Group: Individual Therapy  Servando Snare 02/21/2015, 3:58 PM

## 2015-02-21 NOTE — Progress Notes (Signed)
Subjective/Complaints: Burning pain in toes was on gabapentin at home and is on currently, has not tried lyrica Per PT sup for community distances.  Overall improved heel pain, does'nt hurt with walking, foam boot is annoying during sleep   Review of Systems - Negative except feels tired  Objective: Vital Signs: Blood pressure 171/69, pulse 109, temperature 98.5 F (36.9 C), temperature source Oral, resp. rate 18, weight 56.6 kg (124 lb 12.5 oz), last menstrual period 07/18/2012, SpO2 100 %. No results found. Results for orders placed or performed during the hospital encounter of 02/15/15 (from the past 72 hour(s))  Glucose, capillary     Status: None   Collection Time: 02/18/15 11:30 AM  Result Value Ref Range   Glucose-Capillary 89 70 - 99 mg/dL  Glucose, capillary     Status: None   Collection Time: 02/18/15  4:24 PM  Result Value Ref Range   Glucose-Capillary 81 70 - 99 mg/dL  Glucose, capillary     Status: None   Collection Time: 02/18/15  8:42 PM  Result Value Ref Range   Glucose-Capillary 75 70 - 99 mg/dL  Glucose, capillary     Status: None   Collection Time: 02/19/15  6:38 AM  Result Value Ref Range   Glucose-Capillary 73 70 - 99 mg/dL  Glucose, capillary     Status: None   Collection Time: 02/19/15 11:18 AM  Result Value Ref Range   Glucose-Capillary 87 70 - 99 mg/dL   Comment 1 Notify RN   Glucose, capillary     Status: None   Collection Time: 02/19/15  4:51 PM  Result Value Ref Range   Glucose-Capillary 88 70 - 99 mg/dL   Comment 1 Notify RN   Glucose, capillary     Status: None   Collection Time: 02/19/15  8:51 PM  Result Value Ref Range   Glucose-Capillary 83 70 - 99 mg/dL   Comment 1 Notify RN   Glucose, capillary     Status: None   Collection Time: 02/20/15  7:12 AM  Result Value Ref Range   Glucose-Capillary 79 70 - 99 mg/dL   Comment 1 Notify RN   Glucose, capillary     Status: None   Collection Time: 02/20/15 11:24 AM  Result Value Ref Range    Glucose-Capillary 78 70 - 99 mg/dL   Comment 1 Notify RN   CBC with Differential/Platelet     Status: Abnormal   Collection Time: 02/20/15  6:00 PM  Result Value Ref Range   WBC 4.3 4.0 - 10.5 K/uL   RBC 2.79 (L) 3.87 - 5.11 MIL/uL   Hemoglobin 7.8 (L) 12.0 - 15.0 g/dL   HCT 14.5 (L) 46.2 - 01.2 %   MCV 89.6 78.0 - 100.0 fL   MCH 28.0 26.0 - 34.0 pg   MCHC 31.2 30.0 - 36.0 g/dL   RDW 03.7 (H) 92.8 - 62.8 %   Platelets 273 150 - 400 K/uL   Neutrophils Relative % 67 43 - 77 %   Neutro Abs 2.8 1.7 - 7.7 K/uL   Lymphocytes Relative 19 12 - 46 %   Lymphs Abs 0.8 0.7 - 4.0 K/uL   Monocytes Relative 13 (H) 3 - 12 %   Monocytes Absolute 0.6 0.1 - 1.0 K/uL   Eosinophils Relative 1 0 - 5 %   Eosinophils Absolute 0.1 0.0 - 0.7 K/uL   Basophils Relative 0 0 - 1 %   Basophils Absolute 0.0 0.0 - 0.1 K/uL  Renal  function panel     Status: Abnormal   Collection Time: 02/20/15  6:00 PM  Result Value Ref Range   Sodium 133 (L) 135 - 145 mmol/L   Potassium 5.4 (H) 3.5 - 5.1 mmol/L   Chloride 94 (L) 96 - 112 mmol/L   CO2 24 19 - 32 mmol/L   Glucose, Bld 69 (L) 70 - 99 mg/dL   BUN 32 (H) 6 - 23 mg/dL   Creatinine, Ser 5.90 (H) 0.50 - 1.10 mg/dL   Calcium 8.8 8.4 - 10.5 mg/dL   Phosphorus 6.6 (H) 2.3 - 4.6 mg/dL   Albumin 2.4 (L) 3.5 - 5.2 g/dL   GFR calc non Af Amer 7 (L) >90 mL/min   GFR calc Af Amer 9 (L) >90 mL/min    Comment: (NOTE) The eGFR has been calculated using the CKD EPI equation. This calculation has not been validated in all clinical situations. eGFR's persistently <90 mL/min signify possible Chronic Kidney Disease.    Anion gap 15 5 - 15  Glucose, capillary     Status: None   Collection Time: 02/21/15  6:41 AM  Result Value Ref Range   Glucose-Capillary 87 70 - 99 mg/dL   Vitals reviewed. Constitutional: She is oriented to person, place, and time. She appears well-developed.  Mood/affect are appropriate  Head: Normocephalic.  Eyes: EOM are normal.  Lungs:   Clear to auscultation Cor RRR, no murmurs Neurological:  Follows commands  Skin:  Fistula site clean and dry. Left heel dry .Not tender to touch. Is not fluctuant. Covered with foam dressing and heel boot  Assessment/Plan: 1. Functional deficits secondary to Deconditioning respiratory failure which require 3+ hours per day of interdisciplinary therapy in a comprehensive inpatient rehab setting. Physiatrist is providing close team supervision and 24 hour management of active medical problems listed below. Physiatrist and rehab team continue to assess barriers to discharge/monitor patient progress toward functional and medical goals. FIM: FIM - Bathing Bathing Steps Patient Completed: Chest, Right Arm, Left Arm, Abdomen, Front perineal area, Buttocks, Right upper leg, Left upper leg, Right lower leg (including foot), Left lower leg (including foot) (at sink) Bathing: 6: More than reasonable amount of time  FIM - Upper Body Dressing/Undressing Upper body dressing/undressing steps patient completed: Thread/unthread right sleeve of pullover shirt/dresss, Thread/unthread left sleeve of pullover shirt/dress, Put head through opening of pull over shirt/dress, Pull shirt over trunk Upper body dressing/undressing: 6: More than reasonable amount of time FIM - Lower Body Dressing/Undressing Lower body dressing/undressing steps patient completed: Thread/unthread right underwear leg, Thread/unthread left underwear leg, Pull underwear up/down, Thread/unthread right pants leg, Thread/unthread left pants leg, Pull pants up/down, Don/Doff left sock, Don/Doff right sock Lower body dressing/undressing: 5: Set-up assist to: Don/Doff TED stocking  FIM - Toileting Toileting steps completed by patient: Adjust clothing prior to toileting, Performs perineal hygiene, Adjust clothing after toileting Toileting Assistive Devices: Grab bar or rail for support Toileting: 6: More than reasonable amount of time  FIM -  Radio producer Devices: Bedside commode, Oncologist Transfers: 6-More than reasonable amt of time  FIM - Control and instrumentation engineer Devices: Arm rests Bed/Chair Transfer: 6: Supine > Sit: No assist, 6: Sit > Supine: No assist  FIM - Locomotion: Wheelchair Distance: 150 Locomotion: Wheelchair: 0: Activity did not occur (pt ambulatory) FIM - Locomotion: Ambulation Locomotion: Ambulation Assistive Devices: Administrator Ambulation/Gait Assistance: 5: Supervision Locomotion: Ambulation: 5: Travels 150 ft or more with supervision/safety issues  Comprehension Comprehension Mode:  Auditory Comprehension: 6-Follows complex conversation/direction: With extra time/assistive device  Expression Expression Mode: Verbal Expression: 5-Expresses basic 90% of the time/requires cueing < 10% of the time.  Social Interaction Social Interaction: 7-Interacts appropriately with others - No medications needed.  Problem Solving Problem Solving Mode: Asleep Problem Solving: 6-Solves complex problems: With extra time  Memory Memory: 6-More than reasonable amt of time  Medical Problem List and Plan: 1. Functional deficits secondary to deconditioning secondary to respiratory failure/history brainstem infarct right paramedian pontine 2. DVT Prophylaxis/Anticoagulation: Subcutaneous heparin. Monitor platelet counts and any signs of bleeding. Venous Doppler is negative 3. Pain Management: Tylenol as needed, peripheral neuropathy, ?uremic vs SLE associated, stop gabapentin, trial pregabalin 4. Dysphagia. Nasogastric tube feeds. Follow-up speech therapy 5. Neuropsych: This patient is capable of making decisions on her own behalf. 6. Skin/Wound Care: Skin care as directed  -prevalon boot for pressure are left heel 7. Fluids/Electrolytes/Nutrition: encourage po intake 8. End-stage renal disease/SLE. Continue dialysis as per renal  services 9.Hypertension/SVT. Labetalol 250 mg 3 times a day. Monitor with increased mobility 10. Acute on Chronic anemia. Follow up cbc.Aranesp weekly 11.Lupus.Cellcept/Plaquenil \ LOS (Days) 6 A FACE TO FACE EVALUATION WAS PERFORMED  KIRSTEINS,ANDREW E 02/21/2015, 8:38 AM

## 2015-02-21 NOTE — Progress Notes (Signed)
Social Work Patient ID: [name] [name], female   DOB: [birth year], 54 y.o.   MRN: [id number] Met with pt and son who was here to inform team conference goals-mod/i level and discharge 4/21.  She is very pleased with her progress and feels ready to go home tomorrow. Dicussed Op versus Home health services, she reports her children all work different shifts and can not provide transportation. Will set up [facility] had before and have given her the appointment  To go to HD on Friday at 2;30 pm.  Have left a message for her sister-Joan await return call with questions.

## 2015-02-21 NOTE — Discharge Instructions (Signed)
Inpatient Rehab Discharge Instructions  ERCILIA BETTINGER Discharge date and time: No discharge date for patient encounter.   Activities/Precautions/ Functional Status: Activity: activity as tolerated Diet: soft Wound Care: none needed Functional status:  ___ No restrictions     ___ Walk up steps independently ___ 24/7 supervision/assistance   ___ Walk up steps with assistance ___ Intermittent supervision/assistance  ___ Bathe/dress independently ___ Walk with walker     ___ Bathe/dress with assistance ___ Walk Independently    ___ Shower independently __x_ Walk with assistance    ___ Shower with assistance ___ No alcohol     ___ Return to work/school ________  Special Instructions:  Dialysis as directed   COMMUNITY REFERRALS UPON DISCHARGE:    Home Health:   PT& RN    [place] BULAG:[phone] Date of last service:02/22/2015  Medical Equipment/Items Ordered:HAS FROM PREVIOUS ADMITS   [place] 4/22 2;30 PM TO COMPLETE PAPERWORK THEN BEGIN SAT  GENERAL COMMUNITY RESOURCES FOR PATIENT/FAMILY: Support Groups:[facility] SUPPORT GROUP   My questions have been answered and I understand these instructions. I will adhere to these goals and the provided educational materials after my discharge from the hospital.  Patient/Caregiver Signature _______________________________ Date __________  Clinician Signature _______________________________________ Date __________  Please bring this form and your medication list with you to all your follow-up doctor's appointments.

## 2015-02-21 NOTE — Discharge Summary (Signed)
NAMESEE, BEHARRY                ACCOUNT NO.:  [id number]  MEDICAL RECORD NO.:  [id number]  LOCATION:  4M11C                        FACILITY:  [facility]  PHYSICIAN:  Lauraine Rinne, P.A.  DATE OF BIRTH:  [birth year]  DATE OF ADMISSION:  02/15/2015 DATE OF DISCHARGE:  02/22/2015                              DISCHARGE SUMMARY   DISCHARGE DIAGNOSES: 1. Functional deficits secondary to deconditioning related to     respiratory failure with history of brain stem infarction as well     as right paramedian pontine infarct. 2. Subcutaneous heparin for DVT prophylaxis. 3. Pain management. 4. End-stage renal disease with hemodialysis as well as history of SLE     hypertension with supraventricular tachycardia, acute on chronic     anemia.  HISTORY OF PRESENT ILLNESS:  This is a 54 year old right-handed female, history of remote right inferior brainstem hemorrhage, received inpatient rehab services on November 2015, as well as hypertension, diabetes mellitus, chronic renal insufficiency with baseline creatinine 2.97 and lupus.  Independent with a cane prior to admission, living with 2 adult sons.  Presented January 18, 2015, with vague complaints of shortness of breath, diarrhea, generalized fatigue.  Troponin mildly elevated 0.57.  Chest x-ray showed no acute pulmonary process.  The patient developed intermittent SVT, persistent tachycardia, heart rate in the 150s became hypoxic requiring nonrebreather mask and ventilator support.  Ultimately extubated February 15, 2015.  Cranial CT scan negative.  MRI of the brain showed remote right inferior brain stem hemorrhage without acute findings.  Venous Doppler studies negative. Echocardiogram with ejection fraction of 50%.  Severe hypokinesis.  EEG with no seizure activity, but did show diffuse disturbance.  Etiology nonspecific, suspect metabolic encephalopathy.  Acute on chronic renal failure.  Followup per Renal Services, underwent left basilic  vein transposition fistula on April 13 per Dr. Donnetta Hutching.  She was receiving nasogastric tube feeds for nutritional support, maintained on subcutaneous heparin for DVT prophylaxis.  Physical and occupational therapy ongoing.  The patient was admitted for comprehensive rehab program.  PAST MEDICAL HISTORY:  See discharge diagnosis.  SOCIAL HISTORY:  Independent with a cane prior to admission, living with 2 adult sons.  FUNCTIONAL STATUS:  Upon admission to rehab services with minimal assist, ambulate 100 feet with a rolling walker, minimal guard sit to stand, min to mod assist for activities of daily living.  PHYSICAL EXAMINATION:  VITAL SIGNS:  Blood pressure 133/71, pulse 85, temperature 98, respirations 16.  GENERAL:  This was an alert female, oriented x3.  LUNGS:  Clear to auscultation.  CARDIAC:  Regular rate and rhythm.  ABDOMEN:  Soft, nontender.  Good bowel sounds.  EXTREMITIES:  Moving all extremities.  REHABILITATION HOSPITAL COURSE:  The patient was admitted to inpatient rehab services with therapies initiated on a 3-hour daily basis consisting of Physical Therapy, Occupational Therapy, and Rehabilitation Nursing.  The following issues were addressed during the patient's rehabilitation stay.  Pertaining to Ms. Henigan's deconditioning secondary to respiratory failure as well as history of brainstem infarct remained stable, she continued to progress nicely.  Strength and endurance continued to improve.  She had been on subcutaneous heparin for DVT prophylaxis.  Venous Doppler studies negative.  Her diet was steadily advanced to a mechanical soft, tolerating well.  She was followed by Renal Services for chronic renal insufficiency, ultimately requiring dialysis.  Plans were made for outpatient dialysis as advised and follow up with Renal Services.  Acute on chronic anemia, she remained on Aranesp as advised.  Blood pressures well controlled.  Cardiac rate monitored.  She  continued on labetalol 250 mg t.i.d.  She did have a history of lupus.  She continued on CellCept as well as Plaquenil.  The patient received weekly collaborative interdisciplinary team conferences to discuss estimated length of stay, family teaching, and any barriers to discharge.  She is ambulating to and from to therapy GM 150 feet x 2. Rolling walker, supervision level strength and endurance continued to greatly improved.  She could march in place.  Her working with high level balance doing quite well.  Performed all toileting at modified independent level.  She was using her straight point cane.  All family teaching was completed.  Plan discharged to home.  DISCHARGE MEDICATIONS: 1. Xanax 0.25 mg p.o. every 8 hours as needed anxiety. 2. Aranesp 150 mcg every 2-3 with dialysis. 3. Ferrous sulfate 325 mg p.o. b.i.d. 4. Folic acid 1 mg p.o. daily. 5. Plaquenil 200 mg p.o. b.i.d. 6. Labetalol 250 mg p.o. t.i.d. 7. Multivitamin daily. 8. Oxycodone 1 tablet every 4 hours as needed pain dispense of 90     tablets. 9. Protonix 40 mg p.o. daily. 10.Lyrica 75 mg p.o. daily. 11.Florastor 250 mg p.o. b.i.d. 12.Renvela 800 mg p.o. t.i.d.  DIET:  Mechanical soft diet.  FOLLOWUP PLAN:  The patient would follow up with Dr. Alysia Penna at the outpatient rehab center as needed; Dr. Roney Jaffe, renal Services.  The patient continues with ongoing dialysis.  Their recommendations, ongoing therapies had been arranged as per rehab services.     Lauraine Rinne, P.A.     DA/MEDQ  D:  02/21/2015  T:  02/21/2015  Job:  [id number]  cc:   Darreld Mclean, MD Sol Blazing, M.D.

## 2015-02-21 NOTE — Progress Notes (Signed)
Subjective:  No complaints, anticipating discharge tomorrow after dialysis  Objective: Vital signs in last 24 hours: Temp:  [97.1 F (36.2 C)-98.5 F (36.9 C)] 98.5 F (36.9 C) (04/20 0557) Pulse Rate:  [79-111] 109 (04/20 0557) Resp:  [15-18] 18 (04/20 0557) BP: (134-198)/(56-96) 171/69 mmHg (04/20 0557) SpO2:  [95 %-100 %] 100 % (04/20 0557) Weight:  [56.6 kg (124 lb 12.5 oz)-59.5 kg (131 lb 2.8 oz)] 56.6 kg (124 lb 12.5 oz) (04/20 0557) Weight change: 0.9 kg (1 lb 15.8 oz)  Intake/Output from previous day: 04/19 0701 - 04/20 0700 In: 480 [P.O.:480] Out: 2000  Intake/Output this shift:   Lab Results:  Recent Labs  02/20/15 1800  WBC 4.3  HGB 7.8*  HCT 25.0*  PLT 273   BMET:  Recent Labs  02/20/15 1800  NA 133*  K 5.4*  CL 94*  CO2 24  GLUCOSE 69*  BUN 32*  CREATININE 5.90*  CALCIUM 8.8  ALBUMIN 2.4*   No results for input(s): PTH in the last 72 hours. Iron Studies: No results for input(s): IRON, TIBC, TRANSFERRIN, FERRITIN in the last 72 hours.  Studies/Results: No results found.   EXAM: Gen: Alert, in no apparent distress Resp: CTA without rales, rhonchi, or wheezes Cardio: RRR with Gr II/VI systolic murmur, no rub GI: + BS, soft and nontender Extremities: No edema Access: R IJ catheter, AVF @ LUA with + bruit  HD: new start , on TTS sched here, OP schedule pending. 3.5 hrs  Assessment/Plan: 1. 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 2. Dialysis access - using R IJ catheter, L 1st stage BVT placed 4/13 by Dr. Donnetta Hutching.  Next HD tomorrow. 3. Anemia - reason for admission, Hgb 7.8, on Aranesp 150 mcg on Tues; Fe sat 68%, ferritin 1206 on 3/4. DC PO Fe SO4. 4. HTN/Volume - BP 171/69 on Labetalol 250 mg q8h; wt 56.6 kg s/p net UF 2 L yesterday.  5. Sec HPT - Ca 8.8 (10.1 corrected), P 6.6, iPTH 1171 on 3/22; Hectorol 1 mcg, no binders.  Start Renvela. 6. Nutrition - Alb 2.4, diet Dys 3,  vitamin. 7. SLE - tapering Cellcept (now 500 mg bid), Plaquenil. Will d/c Cellcept as pt now ESRD.  8. Debility - in rehab.    LOS: 6 days   LYLES,CHARLES 02/21/2015,10:23 AM  Pt seen, examined, agree w assess/plan as above with additions as indicated.  Kelly Splinter MD pager [phone]    cell [phone] 02/21/2015, 1:08 PM

## 2015-02-21 NOTE — Progress Notes (Signed)
Physical Therapy Discharge Summary  Patient Details  Name: [name] [name] MRN: [id number] Date of Birth: October 29, 1961  Today's Date: 02/21/2015 PT Individual Time: 0830-0930 PT Individual Time Calculation (min): 60 min    Patient has met 10 of 10 long term goals due to improved activity tolerance, improved balance and increased strength.  Patient to discharge at an ambulatory level Modified Independent.   Patient's care partner is independent to provide the necessary intermittent assistance at discharge.  Reasons goals not met: n/a  Recommendation:  Patient will benefit from ongoing skilled PT services in outpatient setting to continue to advance safe functional mobility, address ongoing impairments in decreased balance and decreased activity tolerance, and minimize fall risk.  Equipment: No equipment provided  Reasons for discharge: treatment goals met and discharge from hospital  Patient/family agrees with progress made and goals achieved: Yes   PT treatment/intervention:  Pt received lying in bed, agreeable to therapy session.  Skilled session focused on grad day activities and ensuring safe D/C tomorrow.  Performed all bed mobility, functional transfers, car transfer, furniture transfers, gait in controlled, home and community simulated environment at mod I level.  Performed >300' feet gait during session.  Performed 12, 6" steps with single rail at mod I level.  Performed BERG balance test with new score of 46/56 indicative of moderate fall risk and need for continued follow up therapy.  Also performed TUG with new median score of 18 secs.  See full details below.  Ended with floor transfer at mod I level.  Pt able to verbalize when to call 911 vs when to attempt transfer.  Pt ambulated back to room, made mod I in room, RN made aware.    PT Discharge Precautions/Restrictions Precautions Precautions: Fall Restrictions Weight Bearing Restrictions: No  Pain Pain Assessment Pain  Assessment: 0-10 Pain Score: 9  Pain Type: Acute pain Pain Location: Foot Pain Orientation: Right;Left Pain Descriptors / Indicators: Pins and needles Pain Intervention(s): RN made aware Vision/Perception   See OT note.  Cognition Overall Cognitive Status: Within Functional Limits for tasks assessed Arousal/Alertness: Awake/alert Orientation Level: Oriented X4 Attention: Alternating;Divided Selective Attention: Appears intact Alternating Attention: Appears intact Divided Attention: Appears intact Memory: Appears intact Awareness: Appears intact Safety/Judgment: Appears intact Sensation Sensation Light Touch: Appears Intact Stereognosis: Not tested Hot/Cold: Not tested Proprioception: Appears Intact Coordination Gross Motor Movements are Fluid and Coordinated: Yes Fine Motor Movements are Fluid and Coordinated: Yes Heel Shin Test: [facility] Motor  Motor Motor: Other (comment) Motor - Discharge Observations: decreased balance  Mobility Bed Mobility Bed Mobility: Supine to Sit;Sit to Supine Supine to Sit: 6: Modified independent (Device/Increase time) Sit to Supine: 6: Modified independent (Device/Increase time) Transfers Transfers: Yes Sit to Stand: 6: Modified independent (Device/Increase time) Stand to Sit: 6: Modified independent (Device/Increase time) Stand Pivot Transfers: 6: Modified independent (Device/Increase time) Locomotion  Ambulation Ambulation: Yes Ambulation/Gait Assistance: 6: Modified independent (Device/Increase time) Ambulation Distance (Feet): 300 Feet Assistive device: Rolling walker Gait Gait: Yes Gait Pattern: Impaired Gait Pattern: Decreased stance time - left;Decreased step length - right;Decreased stride length;Trunk flexed Stairs / Additional Locomotion Stairs: Yes Stairs Assistance: 6: Modified independent (Device/Increase time) Stair Management Technique: One rail Right;Step to pattern;Forwards Number of Stairs: 12 Height of Stairs:  6 Wheelchair Mobility Wheelchair Mobility: No (pt ambulatory) Distance:  (pt ambulatory)  Trunk/Postural Assessment  Cervical Assessment Cervical Assessment: Within Functional Limits Thoracic Assessment Thoracic Assessment: Within Functional Limits Lumbar Assessment Lumbar Assessment: Within Functional Limits Postural Control Postural Control: Deficits on  Physical Therapy Discharge Summary  Patient Details  Name: [name] [name] MRN: [id number] Date of Birth: October 29, 1961  Today's Date: 02/21/2015 PT Individual Time: 0830-0930 PT Individual Time Calculation (min): 60 min    Patient has met 10 of 10 long term goals due to improved activity tolerance, improved balance and increased strength.  Patient to discharge at an ambulatory level Modified Independent.   Patient's care partner is independent to provide the necessary intermittent assistance at discharge.  Reasons goals not met: n/a  Recommendation:  Patient will benefit from ongoing skilled PT services in outpatient setting to continue to advance safe functional mobility, address ongoing impairments in decreased balance and decreased activity tolerance, and minimize fall risk.  Equipment: No equipment provided  Reasons for discharge: treatment goals met and discharge from hospital  Patient/family agrees with progress made and goals achieved: Yes   PT treatment/intervention:  Pt received lying in bed, agreeable to therapy session.  Skilled session focused on grad day activities and ensuring safe D/C tomorrow.  Performed all bed mobility, functional transfers, car transfer, furniture transfers, gait in controlled, home and community simulated environment at mod I level.  Performed >300' feet gait during session.  Performed 12, 6" steps with single rail at mod I level.  Performed BERG balance test with new score of 46/56 indicative of moderate fall risk and need for continued follow up therapy.  Also performed TUG with new median score of 18 secs.  See full details below.  Ended with floor transfer at mod I level.  Pt able to verbalize when to call 911 vs when to attempt transfer.  Pt ambulated back to room, made mod I in room, RN made aware.    PT Discharge Precautions/Restrictions Precautions Precautions: Fall Restrictions Weight Bearing Restrictions: No  Pain Pain Assessment Pain  Assessment: 0-10 Pain Score: 9  Pain Type: Acute pain Pain Location: Foot Pain Orientation: Right;Left Pain Descriptors / Indicators: Pins and needles Pain Intervention(s): RN made aware Vision/Perception   See OT note.  Cognition Overall Cognitive Status: Within Functional Limits for tasks assessed Arousal/Alertness: Awake/alert Orientation Level: Oriented X4 Attention: Alternating;Divided Selective Attention: Appears intact Alternating Attention: Appears intact Divided Attention: Appears intact Memory: Appears intact Awareness: Appears intact Safety/Judgment: Appears intact Sensation Sensation Light Touch: Appears Intact Stereognosis: Not tested Hot/Cold: Not tested Proprioception: Appears Intact Coordination Gross Motor Movements are Fluid and Coordinated: Yes Fine Motor Movements are Fluid and Coordinated: Yes Heel Shin Test: [facility] Motor  Motor Motor: Other (comment) Motor - Discharge Observations: decreased balance  Mobility Bed Mobility Bed Mobility: Supine to Sit;Sit to Supine Supine to Sit: 6: Modified independent (Device/Increase time) Sit to Supine: 6: Modified independent (Device/Increase time) Transfers Transfers: Yes Sit to Stand: 6: Modified independent (Device/Increase time) Stand to Sit: 6: Modified independent (Device/Increase time) Stand Pivot Transfers: 6: Modified independent (Device/Increase time) Locomotion  Ambulation Ambulation: Yes Ambulation/Gait Assistance: 6: Modified independent (Device/Increase time) Ambulation Distance (Feet): 300 Feet Assistive device: Rolling walker Gait Gait: Yes Gait Pattern: Impaired Gait Pattern: Decreased stance time - left;Decreased step length - right;Decreased stride length;Trunk flexed Stairs / Additional Locomotion Stairs: Yes Stairs Assistance: 6: Modified independent (Device/Increase time) Stair Management Technique: One rail Right;Step to pattern;Forwards Number of Stairs: 12 Height of Stairs:  6 Wheelchair Mobility Wheelchair Mobility: No (pt ambulatory) Distance:  (pt ambulatory)  Trunk/Postural Assessment  Cervical Assessment Cervical Assessment: Within Functional Limits Thoracic Assessment Thoracic Assessment: Within Functional Limits Lumbar Assessment Lumbar Assessment: Within Functional Limits Postural Control Postural Control: Deficits on

## 2015-02-21 NOTE — Patient Care Conference (Signed)
Inpatient RehabilitationTeam Conference and Plan of Care Update Date: 02/21/2015   Time: 11;30 AM    Patient Name: [name] [name]      Medical Record Number: [id number]  Date of Birth: August 15, 1961 Sex: Female         Room/Bed: 4M11C/4M11C-01 Payor Info: Payor: [employer] / Plan: CIGNA MANAGED / Product Type: *No Product type* /    Admitting Diagnosis: old r cva deconditioned   Admit Date/Time:  02/15/2015  5:30 PM Admission Comments: No comment available   Primary Diagnosis:  Debilitated Principal Problem: Debilitated  Patient Active Problem List   Diagnosis Date Noted  . History of CVA (cerebrovascular accident) 02/20/2015  . ESRD on dialysis 02/19/2015  . Debilitated 02/15/2015  . Acute respiratory failure   . SVT (supraventricular tachycardia)   . Other specified hypotension   . Acute diastolic CHF (congestive heart failure)   . Protein-calorie malnutrition   . Debility 02/07/2015  . Encephalopathy acute   . Atelectasis, left   . Confusion   . Acute respiratory failure with hypoxia 01/22/2015  . ARDS (adult respiratory distress syndrome) 01/22/2015  . Tachycardia   . Weakness 01/19/2015  . Elevated troponin level 01/19/2015  . Hypotension 01/19/2015  . Absolute anemia   . Shortness of breath 01/18/2015  . Diarrhea 12/07/2014  . Hemorrhoid 12/07/2014  . HCAP (healthcare-associated pneumonia) 11/23/2014  . Fever   . Bleeding gastrointestinal   . Tremor 11/21/2014  . Protein-calorie malnutrition, severe 11/20/2014  . GIB (gastrointestinal bleeding) 11/19/2014  . CKD (chronic kidney disease) stage 4, GFR 15-29 ml/min 11/19/2014  . Abdominal pain 11/19/2014  . Noninfectious gastroenteritis and colitis 11/19/2014  . Melena   . Acute blood loss anemia   . Acute on chronic renal failure   . Hyponatremia   . Hyperglycemia   . MCTD (mixed connective tissue disease)   . SLE (systemic lupus erythematosus)   . Secondary cardiomyopathy   . Noncompliance with medication regimen    . Tobacco abuse   . Sepsis 10/10/2014  . Leucocytosis 10/10/2014  . Fluid overload 10/10/2014  . Chronic kidney disease 10/10/2014  . SIRS (systemic inflammatory response syndrome) 10/10/2014  . Acute renal failure superimposed on stage 4 chronic kidney disease 10/10/2014  . Anemia 10/10/2014  . Hyperkalemia 10/10/2014  . Acute encephalopathy 10/10/2014  . Membranous glomerulonephritis   . Torticollis, acquired 10/06/2014  . Essential hypertension 10/05/2014  . Acute respiratory acidosis   . ICH (intracerebral hemorrhage)   . Chronic ankle pain   . Cerebral hemorrhage   . Malignant hypertension   . History of stroke 09/28/2014  . 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 Hypertensive emergency 09/28/2014  . Iron deficiency anemia 02/18/2008  . Mononeuritis of lower limb 02/18/2008  . History of adrenal insufficiency 02/18/2008  . DIABETES MELLITUS, TYPE II 10/25/2007  . HYPERLIPIDEMIA 10/25/2007  . UNSPECIFIED ULCERATION OF VULVA 10/25/2007  . Systemic lupus erythematosus 10/25/2007  . Rheumatoid arthritis 10/25/2007  . HYPERTENSION NEC 10/25/2007  . COLONIC POLYPS, HYPERPLASTIC 04/08/2006  . HEMORRHOIDS 04/08/2006    Expected Discharge Date: Expected Discharge Date: 02/22/15  Team Members Present: Physician leading conference: Dr. Claudette Laws PT Present: Harriet Butte, Nita Sickle, PT OT Present: Callie Fielding, OT SLP Present: Jackalyn Lombard, SLP PPS Coordinator present : Tora Duck, RN, CRRN     Current Status/Progress Goal Weekly Team Focus  Medical   HD going well.  Good functional improvement  home d/c  d/c planning,    Bowel/Bladder   HD-Tues, Thurs, Sat.Marland KitchenMarland KitchenIncontinent of  bowel with flatus. LBM 02/19/15  Managed bowel program  Assess need to bulk up stool   Swallow/Nutrition/ Hydration   Dys 3, thin liquids; upgraded to intermittent supervision during meals    mod I for least restrictive diet   trials of regular textures prior to discharge.     ADL's   S to  Mod I overall  Mod I   pt/family education, D/C planning, grad day   Mobility   S to mod I overall  mod I   pt/family education, DC planning, grad day   Communication     na        Safety/Cognition/ Behavioral Observations    no unsafe behaviors        Pain   L heel discomfort r/t to pressure ulcer on heel. Percocet x 1 tab q 4hrs  <4  Monitor for effectiveness   Skin   Pressure ulcer to area under nostril. Applying topical abx to area. Healing pressure ulcer to sacrum, EPBC to area. Unstageable pressure ulcer to L heel  No additional skin breakdown  Encourage turn q 2hrs, applying barrier cream to pressure ulcer, Pravelon boot to L heel when pt is in bed.      *See Care Plan and progress notes for long and short-term goals.  Barriers to Discharge: none    Possible Resolutions to Barriers:  d/c in am    Discharge Planning/Teaching Needs:  Home with multiple family members assisting-doing well and aware of her appointment Friday to sign forms for HD[facility]      Team Discussion:  Goals-mod/i level, trial of regular diet today, in hopes of upgrading but will need to be renal diet.  Energy coming back, body getting used to HD. OP-HD set up to begin Sat. Skin issues healing and looking good.   Revisions to Treatment Plan:  None   Continued Need for Acute Rehabilitation Level of Care: The patient requires daily medical management by a physician with specialized training in physical medicine and rehabilitation for the following conditions: Daily direction of a multidisciplinary physical rehabilitation program to ensure safe treatment while eliciting the highest outcome that is of practical value to the patient.: Yes Daily medical management of patient stability for increased activity during participation in an intensive rehabilitation regime.: Yes Daily analysis of laboratory values and/or radiology reports with any subsequent need for medication adjustment of medical intervention  for : Neurological problems;Other  Xiana Carns, Lemar Livings 02/21/2015, 1:11 PM

## 2015-02-21 NOTE — Discharge Summary (Signed)
Discharge summary job # [id number]

## 2015-02-22 LAB — RENAL FUNCTION PANEL
Albumin: 2.4 g/dL — ABNORMAL LOW (ref 3.5–5.2)
Anion gap: 10 (ref 5–15)
BUN: 17 mg/dL (ref 6–23)
CO2: 26 mmol/L (ref 19–32)
CREATININE: 4.18 mg/dL — AB (ref 0.50–1.10)
Calcium: 9.2 mg/dL (ref 8.4–10.5)
Chloride: 96 mmol/L (ref 96–112)
GFR, EST AFRICAN AMERICAN: 13 mL/min — AB (ref >90)
GFR, EST NON AFRICAN AMERICAN: 11 mL/min — AB (ref >90)
Glucose, Bld: 69 mg/dL — ABNORMAL LOW (ref 70–99)
PHOSPHORUS: 4.8 mg/dL — AB (ref 2.3–4.6)
Potassium: 4.5 mmol/L (ref 3.5–5.1)
SODIUM: 132 mmol/L — AB (ref 135–145)

## 2015-02-22 LAB — CBC
HCT: 25.8 % — ABNORMAL LOW (ref 36.0–46.0)
HEMOGLOBIN: 7.9 g/dL — AB (ref 12.0–15.0)
MCH: 27.6 pg (ref 26.0–34.0)
MCHC: 30.6 g/dL (ref 30.0–36.0)
MCV: 90.2 fL (ref 78.0–100.0)
Platelets: 238 10*3/uL (ref 150–400)
RBC: 2.86 MIL/uL — ABNORMAL LOW (ref 3.87–5.11)
RDW: 16.4 % — ABNORMAL HIGH (ref 11.5–15.5)
WBC: 5.1 10*3/uL (ref 4.0–10.5)

## 2015-02-22 MED ORDER — ATORVASTATIN CALCIUM 20 MG PO TABS: 20.0000 mg | ORAL_TABLET | Freq: Every day | ORAL | Status: DC

## 2015-02-22 MED ORDER — OXYCODONE-ACETAMINOPHEN 5-325 MG PO TABS: 1.0000 | ORAL_TABLET | ORAL | Status: DC | PRN

## 2015-02-22 MED ORDER — HEPARIN SODIUM (PORCINE) 1000 UNIT/ML DIALYSIS: 1000.0000 [IU] | INTRAMUSCULAR | Status: DC | PRN

## 2015-02-22 MED ORDER — DOXERCALCIFEROL 4 MCG/2ML IV SOLN
INTRAVENOUS | Status: AC
  Administered 2015-02-22: 1 ug via INTRAVENOUS
  Filled 2015-02-22: qty 2

## 2015-02-22 MED ORDER — NEPRO/CARBSTEADY PO LIQD
237.0000 mL | ORAL | Status: DC | PRN
  Filled 2015-02-22: qty 237

## 2015-02-22 MED ORDER — SODIUM CHLORIDE 0.9 % IV SOLN: 100.0000 mL | INTRAVENOUS | Status: DC | PRN

## 2015-02-22 MED ORDER — PREGABALIN 75 MG PO CAPS: 75.0000 mg | ORAL_CAPSULE | Freq: Every day | ORAL | Status: DC

## 2015-02-22 MED ORDER — SEVELAMER CARBONATE 800 MG PO TABS: 800.0000 mg | ORAL_TABLET | Freq: Three times a day (TID) | ORAL | Status: DC

## 2015-02-22 MED ORDER — PANTOPRAZOLE SODIUM 40 MG PO TBEC: 40.0000 mg | DELAYED_RELEASE_TABLET | Freq: Every day | ORAL | Status: AC

## 2015-02-22 MED ORDER — LABETALOL HCL 300 MG PO TABS: 250.0000 mg | ORAL_TABLET | Freq: Three times a day (TID) | ORAL | Status: DC

## 2015-02-22 MED ORDER — ALPRAZOLAM 0.25 MG PO TABS: 0.2500 mg | ORAL_TABLET | Freq: Three times a day (TID) | ORAL | Status: AC | PRN

## 2015-02-22 MED ORDER — FOLIC ACID 1 MG PO TABS: 1.0000 mg | ORAL_TABLET | Freq: Every day | ORAL | Status: DC

## 2015-02-22 MED ORDER — ALTEPLASE 2 MG IJ SOLR
2.0000 mg | Freq: Once | INTRAMUSCULAR | Status: DC | PRN
Start: 2015-02-22 — End: 2015-02-22
  Filled 2015-02-22: qty 2

## 2015-02-22 MED ORDER — SACCHAROMYCES BOULARDII 250 MG PO CAPS: 250.0000 mg | ORAL_CAPSULE | Freq: Two times a day (BID) | ORAL | Status: DC

## 2015-02-22 MED ORDER — FERROUS SULFATE 325 (65 FE) MG PO TABS: 325.0000 mg | ORAL_TABLET | Freq: Two times a day (BID) | ORAL | Status: DC

## 2015-02-22 MED ORDER — PENTAFLUOROPROP-TETRAFLUOROETH EX AERO: 1.0000 "application " | INHALATION_SPRAY | CUTANEOUS | Status: DC | PRN

## 2015-02-22 MED ORDER — LIDOCAINE HCL (PF) 1 % IJ SOLN: 5.0000 mL | INTRAMUSCULAR | Status: DC | PRN

## 2015-02-22 MED ORDER — HYDROXYCHLOROQUINE SULFATE 200 MG PO TABS: 200.0000 mg | ORAL_TABLET | Freq: Two times a day (BID) | ORAL | Status: DC

## 2015-02-22 MED ORDER — LIDOCAINE-PRILOCAINE 2.5-2.5 % EX CREA
1.0000 "application " | TOPICAL_CREAM | CUTANEOUS | Status: DC | PRN
  Filled 2015-02-22: qty 5

## 2015-02-22 MED ORDER — THIAMINE HCL 100 MG PO TABS: 100.0000 mg | ORAL_TABLET | Freq: Every day | ORAL | Status: AC

## 2015-02-22 NOTE — Progress Notes (Signed)
Speech Language Pathology Discharge Summary  Patient Details  Name: [name] [name] MRN: [id number] Date of Birth: 05/12/1961  Patient has met 3 of 3 long term goals.  Patient to discharge at overall Modified Independent level.   Reasons goals not met: N/A   Clinical Impression/Discharge Summary: Patient has made functional gains and has met 3 of 3 LTG's this admission due to increased swallowing function. Currently, patient is consuming regular textures with thin liquids via cup with minimal overt s/s of aspiration and is Mod I for use of swallowing compensatory strategies. Patient continues to demonstrate a hoarse vocal quality but was 100% intelligible at the conversation level.  Although patient is consuming regular textures with thin liquids, her swallowing function is not completely back to baseline in regards to ability to drink with a straw, therefore, recommend f/u outpatient SLP services to maximize swallowing function and overall safety with meals.  Patient education complete and patient will discharge home with family.   Care Partner:  Caregiver Able to Provide Assistance: Yes   Recommendation:  Outpatient SLP  Rationale for SLP Follow Up: Maximize swallowing safety   Equipment: N/A   Reasons for discharge: Treatment goals met;Discharged from hospital   Patient/Family Agrees with Progress Made and Goals Achieved: Yes   See FIM for current functional status  [name] 02/22/2015, 7:30 AM

## 2015-02-22 NOTE — Progress Notes (Signed)
Subjective:  Seen on dialysis, no complaints, ready for discharge today, appt tomorrow at [facility] to start dialysis on 4/23  Objective: Vital signs in last 24 hours: Temp:  [98.2 F (36.8 C)-98.3 F (36.8 C)] 98.2 F (36.8 C) (04/21 0700) Pulse Rate:  [84-106] 94 (04/21 0730) Resp:  [17-20] 20 (04/21 0705) BP: (130-171)/(71-81) 153/79 mmHg (04/21 0730) SpO2:  [99 %-100 %] 100 % (04/21 0700) Weight:  [56.7 kg (125 lb)-56.8 kg (125 lb 3.5 oz)] 56.8 kg (125 lb 3.5 oz) (04/21 0700) Weight change: -2.8 kg (-6 lb 2.8 oz)  Intake/Output from previous day: 04/20 0701 - 04/21 0700 In: 1200 [P.O.:1200] Out: -  Intake/Output this shift:   Lab Results:  Recent Labs  02/20/15 1800 02/22/15 0725  WBC 4.3 5.1  HGB 7.8* 7.9*  HCT 25.0* 25.8*  PLT 273 238   BMET:  Recent Labs  02/20/15 1800  NA 133*  K 5.4*  CL 94*  CO2 24  GLUCOSE 69*  BUN 32*  CREATININE 5.90*  CALCIUM 8.8  ALBUMIN 2.4*   No results for input(s): PTH in the last 72 hours. Iron Studies: No results for input(s): IRON, TIBC, TRANSFERRIN, FERRITIN in the last 72 hours.  Studies/Results: No results found.   EXAM: Gen: Alert, in no apparent distress Resp: CTA without rales, rhonchi, or wheezes Cardio: RRR with Gr II/VI systolic murmur, no rub GI: + BS, soft and nontender Extremities: No edema Access: R IJ catheter, AVF @ LUA with BFR 400  HD: new start , on TTS sched here, OP schedule pending. 3.5 hrs   Assessment/Plan:  1. 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 2. Dialysis access - using R IJ catheter, L 1st stage BVT placed 4/13 by Dr. Donnetta Hutching. 3. Anemia - reason for admission, Hgb 7.9, on Aranesp 150 mcg on Tues; Fe sat 68%, ferritin 1206 on 3/4. 4. HTN/Volume - BP 153/79 on Labetalol 250 mg q8h; wt 56.8 kg with UF goal 2 L today. 5. Sec HPT - Ca 8.8 (10.1 corrected), P 6.6, iPTH 1171 on 3/22; Hectorol 1 mcg, started Renvela. 6. Nutrition - Alb 2.4,  diet Dys 3, vitamin. 7. SLE - Cellcept DC'd, cont Plaquenil. 8. Debility - in rehab.  LOS: 7 days   LYLES,CHARLES 02/22/2015,7:44 AM   Pt seen, examined and agree w A/P as above. She's not sure why she is on protonix, no hx recent ulcer/ GERD, will dc.  HD today.  Kelly Splinter MD pager [phone]    cell [phone] 02/22/2015, 9:53 AM

## 2015-02-22 NOTE — Progress Notes (Signed)
NUTRITION FOLLOW UP  Pt meets criteria for SEVERE MALNUTRITION in the context of acute illness/injury as evidenced by a 19% weight loss in 3 months and moderate muscle mass loss.  DOCUMENTATION CODES Per approved criteria  -Severe malnutrition in the context of acute illness or injury   INTERVENTION: Plans for discharge today.  Recommend continuing Nepro Shake po 1-2/day at home, each supplement provides 425 kcal and 19 grams protein.  NUTRITION DIAGNOSIS: Increased nutrient needs related to chronic illness, ESRD as evidenced by estimated nutrition needs; ongoing  Goal: Pt to meet >/= 90% of their estimated nutrition needs; met  Monitor:  PO intake, weight trends, labs, I/O's  54 y.o. female  Admitting Dx: Debilitated  ASSESSMENT: Pt with history of remote right inferior brainstem hemorrhage, hypertension, diabetes mellitus or peripheral neuropathy, chronic renal insufficiency, lupus. MRI of the brain again showed remote right inferior brainstem hemorrhage without acute findings. Pt with new ESRD on HD.  Meal completion has been varied from 25-100%, however most meals at least 75%. Pt reports good appetite. Pt has been consuming her Nepro Shakes and enjoys them. Pt was educated on continuation of Nepro supplementation at home. Pt asked about Ensure/Boost vs. Nepro. Discussed Nepro is lower in potassium, sodium, and phosphorous, which is a better supplement for the kidneys. Pt encouraged to speak/follow up with dietitian at her outpatient dialysis center.   Labs and medications reviewed.  Height: Ht Readings from Last 1 Encounters:  02/07/15 5\' 5"  (1.651 m)    Weight: Wt Readings from Last 1 Encounters:  02/22/15 121 lb 4.1 oz (55 kg)    BMI:  Body mass index is 20.18 kg/(m^2).  Re-Estimated Nutritional Needs: Kcal: 1650-1900 Protein: 85-100 grams Fluid: 1.2 L/day  Skin: Stage II pressure ulcer on ischial, sacrum, unstageable on heel, incision on L arm  Diet Order:  Diet renal/carb modified with fluid restriction Diet-HS Snack?: Nothing; Room service appropriate?: Yes; Fluid consistency:: Thin    Intake/Output Summary (Last 24 hours) at 02/22/15 1203 Last data filed at 02/22/15 1109  Gross per 24 hour  Intake    720 ml  Output   1801 ml  Net  -1081 ml    Last BM: 4/19  Labs:   Recent Labs Lab 02/20/15 1800 02/22/15 0726  NA 133* 132*  K 5.4* 4.5  CL 94* 96  CO2 24 26  BUN 32* 17  CREATININE 5.90* 4.18*  CALCIUM 8.8 9.2  PHOS 6.6* 4.8*  GLUCOSE 69* 69*    CBG (last 3)   Recent Labs  02/21/15 1207 02/21/15 1657 02/21/15 2054  GLUCAP 100* 78 90    Scheduled Meds: . chlorhexidine  15 mL Mouth Rinse BID  . darbepoetin (ARANESP) injection - DIALYSIS  150 mcg Intravenous Q Tue-HD  . doxercalciferol  1 mcg Intravenous Q T,Th,Sa-HD  . feeding supplement (NEPRO CARB STEADY)  237 mL Oral BID BM  . ferrous sulfate  325 mg Oral BID WC  . folic acid  1 mg Oral Daily  . heparin  5,000 Units Subcutaneous 3 times per day  . hydroxychloroquine  200 mg Oral BID  . insulin aspart  0-9 Units Subcutaneous TID WC  . labetalol  250 mg Oral 3 times per day  . multivitamin  1 tablet Oral QHS  . neomycin-bacitracin-polymyxin   Topical TID  . pregabalin  75 mg Oral Daily  . saccharomyces boulardii  250 mg Oral BID  . sevelamer carbonate  800 mg Oral TID WC  . thiamine  100 mg Oral Daily    Continuous Infusions:    Past Medical History  Diagnosis Date  . Hypertension   . Lupus   . CKD (chronic kidney disease)     due to lupus/Dr. [name]  . Diabetes mellitus   . Stenosis of cervical spine region     with HNP at C5/6, C6/7  . Metatarsal bone fracture right    4th  . 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 Arthritis     RA  . Anemia   . Stroke 11/2014    left sided weakness, dysphagia  . Pain in joints   . Paresthesias   . Hyperlipidemia   . Colitis     ? per hospital notes 11/2014   . Lower GI bleed   . Hyponatremia   . Metabolic acidosis   . Hyperplastic colon polyp   . Hemorrhoids   . Blood transfusion without reported diagnosis     Past Surgical History  Procedure Laterality Date  . Breast biopsy    . Tubal ligation    . Insertion of dialysis catheter Right 02/05/2015    Procedure: INSERTION OF DIALYSIS CATHETER - RIGHT INTERNAL JUGULAR;  Surgeon: Conrad [place], MD;  Location: [place];  Service: Vascular;  Laterality: Right;  . Bascilic vein transposition Left 02/14/2015    Procedure: BASILIC VEIN Fistula Creation First Stage;  Surgeon: Rosetta Posner, MD;  Location: [place];  Service: Vascular;  Laterality: Left;    Kallie Locks, MS, RD, LDN Pager # [phone] After hours/ weekend pager # [phone]

## 2015-02-22 NOTE — Progress Notes (Signed)
Patient discharged home.  Left floor via wheelchair, escorted by nursing staff and family.  All patient belongings sent with patient.  Patient and family verbalized understanding of discharge instructions as given by Marlowe Shores, PA.  Patient appears to be in no immediate distress at this time.  Brita Romp, RN

## 2015-02-22 NOTE — Progress Notes (Signed)
Social Work Discharge Note Discharge Note  The overall goal for the admission was met for:   Discharge location: [place] TO ASSIST  Length of Stay: Yes-7 DAYS  Discharge activity level: Yes-MOD/I LEVEL  Home/community participation: Yes  Services provided included: MD, RD, PT, OT, SLP, RN, CM, TR, Pharmacy and [place]: Private Insurance: [place]  Follow-up services arranged: Home Health: [facility] and Patient/Family request agency HH: ACTIVE PT WITH [facility], DME: NONE NEEDED  Comments (or additional information):AWARE OF APPOINTMENT AT [place] 4/22 AT 2;30 PM TO SIGN PAPERWORK WILL BEGIN HD SAT THERE  Patient/Family verbalized understanding of follow-up arrangements: Yes  Individual responsible for coordination of the follow-up plan: PATIENT & JOAN-SISTER  Confirmed correct DME delivered: Elease Hashimoto 02/22/2015    Elease Hashimoto

## 2015-02-25 LAB — GLUCOSE, CAPILLARY: Glucose-Capillary: 79 mg/dL (ref 70–99)

## 2015-03-02 ENCOUNTER — Telehealth: Payer: Self-pay | Admitting: *Deleted

## 2015-03-02 NOTE — Telephone Encounter (Signed)
FYI only: Margaretha Sheffield called to notify Dr Letta Pate there had been a delay in start of care for [name] [name] due to the family was uncertain about payment and it was verified that it would be paid for , but then they had trouble locating patient.  Start is set to begin on 03/05/15.  Policy requires notification when there has been a delay.

## 2015-03-05 DIAGNOSIS — Z0271 Encounter for disability determination: Secondary | ICD-10-CM

## 2015-03-15 ENCOUNTER — Encounter (HOSPITAL_COMMUNITY): Payer: Managed Care, Other (non HMO)

## 2015-03-20 ENCOUNTER — Encounter: Payer: Managed Care, Other (non HMO) | Admitting: Vascular Surgery

## 2015-03-21 ENCOUNTER — Ambulatory Visit (HOSPITAL_COMMUNITY)
Admission: RE | Admit: 2015-03-21 | Discharge: 2015-03-21 | Disposition: A | Discharge status: Home / Self Care | Payer: Managed Care, Other (non HMO) | Source: Ambulatory Visit | Attending: Vascular Surgery | Admitting: Vascular Surgery

## 2015-03-21 DIAGNOSIS — N186 End stage renal disease: Secondary | ICD-10-CM | POA: Insufficient documentation

## 2015-03-21 DIAGNOSIS — Z4931 Encounter for adequacy testing for hemodialysis: Secondary | ICD-10-CM

## 2015-03-22 ENCOUNTER — Encounter: Payer: Self-pay | Admitting: Vascular Surgery

## 2015-03-23 ENCOUNTER — Ambulatory Visit (INDEPENDENT_AMBULATORY_CARE_PROVIDER_SITE_OTHER): Payer: Self-pay | Admitting: Vascular Surgery

## 2015-03-23 ENCOUNTER — Encounter: Payer: Self-pay | Admitting: Vascular Surgery

## 2015-03-23 VITALS — BP 151/78 | HR 77 | Resp 16 | Ht 65.5 in | Wt 126.0 lb

## 2015-03-23 DIAGNOSIS — Z992 Dependence on renal dialysis: Secondary | ICD-10-CM

## 2015-03-23 DIAGNOSIS — N186 End stage renal disease: Secondary | ICD-10-CM

## 2015-03-23 NOTE — Progress Notes (Signed)
Patient just today for follow-up of first stage basilic vein fistula. She had the basilic vein fistula creation by myself on 02/14/2015. She had a catheter placed couple weeks earlier is been on hemodialysis since that time. She reports no tenderness associated with her fistula. She has no steal symptoms.  Physical exam: Her left antecubital incision is healed quite nicely. She has an easily palpable radial pulse. Has excellent thrill in her upper arm.  She underwent formal duplex of her fistula and also imaged this myself with SonoSite. This shows nice maturation with a 6 mm basilic vein from the antecubital fossa proximally.  Impression and plan: Successful first stage of basilic vein fistula. Explained the need now for mobilization since she has had a good size maturation. We will schedule her on a nondialysis day on Monday Wednesday or Friday with the next 1-2 weeks. Explained the procedures an outpatient. Also explained that we would see her back in one month following this we can initiate use of this for long-term access.

## 2015-03-23 NOTE — Progress Notes (Signed)
Filed Vitals:   03/23/15 1425 03/23/15 1429  BP: 155/78 151/78  Pulse: 77 77  Resp: 16   Height: 5' 5.5" (1.664 m)   Weight: 126 lb (57.153 kg)    Body mass index is 20.64 kg/(m^2).

## 2015-03-23 NOTE — Telephone Encounter (Signed)
OPENED IN ERROR

## 2015-03-26 ENCOUNTER — Other Ambulatory Visit: Payer: Self-pay

## 2015-03-27 ENCOUNTER — Encounter (HOSPITAL_COMMUNITY): Payer: Self-pay | Admitting: *Deleted

## 2015-03-27 DIAGNOSIS — N185 Chronic kidney disease, stage 5: Secondary | ICD-10-CM | POA: Diagnosis not present

## 2015-03-27 MED ORDER — DEXTROSE 5 % IV SOLN
1.5000 g | INTRAVENOUS | Status: AC
  Administered 2015-03-28: 1.5 g via INTRAVENOUS
  Filled 2015-03-27: qty 1.5

## 2015-03-27 NOTE — Progress Notes (Signed)
Pt denies SOB and chest pain. Pt verbalized understanding of all pre-op instructions. 

## 2015-03-28 ENCOUNTER — Ambulatory Visit (HOSPITAL_COMMUNITY): Payer: Managed Care, Other (non HMO) | Admitting: Anesthesiology

## 2015-03-28 ENCOUNTER — Ambulatory Visit (HOSPITAL_COMMUNITY)
Admission: RE | Admit: 2015-03-28 | Discharge: 2015-03-28 | Disposition: A | Discharge status: Home / Self Care | Payer: Managed Care, Other (non HMO) | Source: Ambulatory Visit | Attending: Vascular Surgery | Admitting: Vascular Surgery

## 2015-03-28 ENCOUNTER — Other Ambulatory Visit: Payer: Self-pay | Admitting: *Deleted

## 2015-03-28 ENCOUNTER — Encounter (HOSPITAL_COMMUNITY): Payer: Self-pay | Admitting: General Practice

## 2015-03-28 ENCOUNTER — Encounter (HOSPITAL_COMMUNITY)
Admission: RE | Disposition: A | Discharge status: Home / Self Care | Payer: Self-pay | Source: Ambulatory Visit | Attending: Vascular Surgery

## 2015-03-28 DIAGNOSIS — Z4931 Encounter for adequacy testing for hemodialysis: Secondary | ICD-10-CM

## 2015-03-28 DIAGNOSIS — N186 End stage renal disease: Secondary | ICD-10-CM

## 2015-03-28 DIAGNOSIS — I120 Hypertensive chronic kidney disease with stage 5 chronic kidney disease or end stage renal disease: Secondary | ICD-10-CM | POA: Insufficient documentation

## 2015-03-28 DIAGNOSIS — I509 Heart failure, unspecified: Secondary | ICD-10-CM | POA: Insufficient documentation

## 2015-03-28 DIAGNOSIS — Z992 Dependence on renal dialysis: Secondary | ICD-10-CM | POA: Insufficient documentation

## 2015-03-28 DIAGNOSIS — E1122 Type 2 diabetes mellitus with diabetic chronic kidney disease: Secondary | ICD-10-CM | POA: Diagnosis not present

## 2015-03-28 DIAGNOSIS — Z87891 Personal history of nicotine dependence: Secondary | ICD-10-CM | POA: Diagnosis not present

## 2015-03-28 HISTORY — PX: BASCILIC VEIN TRANSPOSITION: SHX5742

## 2015-03-28 LAB — POCT I-STAT 4, (NA,K, GLUC, HGB,HCT)
Glucose, Bld: 84 mg/dL (ref 65–99)
HCT: 54 % — ABNORMAL HIGH (ref 36.0–46.0)
Hemoglobin: 18.4 g/dL — ABNORMAL HIGH (ref 12.0–15.0)
POTASSIUM: 5.1 mmol/L (ref 3.5–5.1)
Sodium: 135 mmol/L (ref 135–145)

## 2015-03-28 LAB — GLUCOSE, CAPILLARY: Glucose-Capillary: 102 mg/dL — ABNORMAL HIGH (ref 65–99)

## 2015-03-28 SURGERY — TRANSPOSITION, VEIN, BASILIC
Anesthesia: General | Site: Arm Upper | Laterality: Left

## 2015-03-28 MED ORDER — OXYCODONE-ACETAMINOPHEN 5-325 MG PO TABS: 1.0000 | ORAL_TABLET | ORAL | Status: DC | PRN

## 2015-03-28 MED ORDER — ROCURONIUM BROMIDE 50 MG/5ML IV SOLN
INTRAVENOUS | Status: AC
  Filled 2015-03-28: qty 1

## 2015-03-28 MED ORDER — PROMETHAZINE HCL 25 MG/ML IJ SOLN
6.2500 mg | INTRAMUSCULAR | Status: DC | PRN
Start: 2015-03-28 — End: 2015-03-28

## 2015-03-28 MED ORDER — PHENYLEPHRINE HCL 10 MG/ML IJ SOLN
10.0000 mg | INTRAVENOUS | Status: DC | PRN
  Administered 2015-03-28: 25 ug/min via INTRAVENOUS

## 2015-03-28 MED ORDER — LIDOCAINE HCL (CARDIAC) 10 MG/ML IV SOLN
INTRAVENOUS | Status: DC | PRN
  Administered 2015-03-28: 60 mg via INTRAVENOUS

## 2015-03-28 MED ORDER — 0.9 % SODIUM CHLORIDE (POUR BTL) OPTIME
TOPICAL | Status: DC | PRN
  Administered 2015-03-28: 1000 mL

## 2015-03-28 MED ORDER — PROPOFOL 10 MG/ML IV BOLUS
INTRAVENOUS | Status: DC | PRN
  Administered 2015-03-28: 150 mg via INTRAVENOUS

## 2015-03-28 MED ORDER — SODIUM CHLORIDE 0.9 % IR SOLN
Status: DC | PRN
  Administered 2015-03-28: 500 mL

## 2015-03-28 MED ORDER — FENTANYL CITRATE (PF) 250 MCG/5ML IJ SOLN
INTRAMUSCULAR | Status: AC
  Filled 2015-03-28: qty 5

## 2015-03-28 MED ORDER — SODIUM CHLORIDE 0.9 % IJ SOLN
INTRAMUSCULAR | Status: AC
  Filled 2015-03-28: qty 10

## 2015-03-28 MED ORDER — ONDANSETRON HCL 4 MG/2ML IJ SOLN
INTRAMUSCULAR | Status: AC
  Filled 2015-03-28: qty 2

## 2015-03-28 MED ORDER — MIDAZOLAM HCL 2 MG/2ML IJ SOLN
INTRAMUSCULAR | Status: AC
  Filled 2015-03-28: qty 2

## 2015-03-28 MED ORDER — HYDROMORPHONE HCL 1 MG/ML IJ SOLN: 0.2500 mg | INTRAMUSCULAR | Status: DC | PRN

## 2015-03-28 MED ORDER — LIDOCAINE-EPINEPHRINE 0.5 %-1:200000 IJ SOLN
INTRAMUSCULAR | Status: AC
  Filled 2015-03-28: qty 1

## 2015-03-28 MED ORDER — OXYCODONE-ACETAMINOPHEN 5-325 MG PO TABS
ORAL_TABLET | ORAL | Status: AC
  Filled 2015-03-28: qty 1

## 2015-03-28 MED ORDER — PROPOFOL 10 MG/ML IV BOLUS
INTRAVENOUS | Status: AC
  Filled 2015-03-28: qty 20

## 2015-03-28 MED ORDER — LIDOCAINE HCL (CARDIAC) 20 MG/ML IV SOLN
INTRAVENOUS | Status: AC
  Filled 2015-03-28: qty 5

## 2015-03-28 MED ORDER — CHLORHEXIDINE GLUCONATE CLOTH 2 % EX PADS: 6.0000 | MEDICATED_PAD | Freq: Once | CUTANEOUS | Status: DC

## 2015-03-28 MED ORDER — EPHEDRINE SULFATE 50 MG/ML IJ SOLN
INTRAMUSCULAR | Status: AC
  Filled 2015-03-28: qty 1

## 2015-03-28 MED ORDER — SODIUM CHLORIDE 0.9 % IV SOLN
INTRAVENOUS | Status: DC
  Administered 2015-03-28 (×2): via INTRAVENOUS

## 2015-03-28 MED ORDER — PHENYLEPHRINE 40 MCG/ML (10ML) SYRINGE FOR IV PUSH (FOR BLOOD PRESSURE SUPPORT)
PREFILLED_SYRINGE | INTRAVENOUS | Status: AC
  Filled 2015-03-28: qty 10

## 2015-03-28 MED ORDER — ONDANSETRON HCL 4 MG/2ML IJ SOLN
INTRAMUSCULAR | Status: DC | PRN
  Administered 2015-03-28: 4 mg via INTRAVENOUS

## 2015-03-28 MED ORDER — FENTANYL CITRATE (PF) 100 MCG/2ML IJ SOLN
INTRAMUSCULAR | Status: DC | PRN
  Administered 2015-03-28: 100 ug via INTRAVENOUS
  Administered 2015-03-28: 50 ug via INTRAVENOUS

## 2015-03-28 MED ORDER — OXYCODONE-ACETAMINOPHEN 5-325 MG PO TABS
1.0000 | ORAL_TABLET | Freq: Once | ORAL | Status: AC
  Administered 2015-03-28: 1 via ORAL

## 2015-03-28 SURGICAL SUPPLY — 36 items
APL SKNCLS STERI-STRIP NONHPOA (GAUZE/BANDAGES/DRESSINGS) ×2
BENZOIN TINCTURE PRP APPL 2/3 (GAUZE/BANDAGES/DRESSINGS) ×5 IMPLANT
CANISTER SUCTION 2500CC (MISCELLANEOUS) ×3 IMPLANT
CANNULA VESSEL 3MM 2 BLNT TIP (CANNULA) ×2 IMPLANT
CLIP LIGATING EXTRA MED SLVR (CLIP) ×3 IMPLANT
CLIP LIGATING EXTRA SM BLUE (MISCELLANEOUS) ×3 IMPLANT
CLOSURE WOUND 1/2 X4 (GAUZE/BANDAGES/DRESSINGS) ×1
COVER PROBE W GEL 5X96 (DRAPES) ×3 IMPLANT
DECANTER SPIKE VIAL GLASS SM (MISCELLANEOUS) ×1 IMPLANT
ELECT REM PT RETURN 9FT ADLT (ELECTROSURGICAL) ×3
ELECTRODE REM PT RTRN 9FT ADLT (ELECTROSURGICAL) ×1 IMPLANT
GAUZE SPONGE 4X4 12PLY STRL (GAUZE/BANDAGES/DRESSINGS) ×3 IMPLANT
GEL ULTRASOUND 20GR AQUASONIC (MISCELLANEOUS) IMPLANT
GLOVE BIO SURGEON STRL SZ 6.5 (GLOVE) ×2 IMPLANT
GLOVE BIO SURGEONS STRL SZ 6.5 (GLOVE) ×2
GLOVE BIOGEL PI IND STRL 6.5 (GLOVE) IMPLANT
GLOVE BIOGEL PI IND STRL 8 (GLOVE) IMPLANT
GLOVE BIOGEL PI INDICATOR 6.5 (GLOVE) ×2
GLOVE BIOGEL PI INDICATOR 8 (GLOVE) ×2
GLOVE SS BIOGEL STRL SZ 7.5 (GLOVE) ×1 IMPLANT
GLOVE SUPERSENSE BIOGEL SZ 7.5 (GLOVE) ×2
GLOVE SURG SS PI 7.0 STRL IVOR (GLOVE) ×2 IMPLANT
GOWN STRL REUS W/ TWL LRG LVL3 (GOWN DISPOSABLE) ×3 IMPLANT
GOWN STRL REUS W/TWL LRG LVL3 (GOWN DISPOSABLE) ×9
KIT BASIN OR (CUSTOM PROCEDURE TRAY) ×3 IMPLANT
KIT ROOM TURNOVER OR (KITS) ×3 IMPLANT
NS IRRIG 1000ML POUR BTL (IV SOLUTION) ×3 IMPLANT
PACK CV ACCESS (CUSTOM PROCEDURE TRAY) ×3 IMPLANT
PAD ARMBOARD 7.5X6 YLW CONV (MISCELLANEOUS) ×6 IMPLANT
STRIP CLOSURE SKIN 1/2X4 (GAUZE/BANDAGES/DRESSINGS) ×2 IMPLANT
SUT PROLENE 6 0 CC (SUTURE) ×3 IMPLANT
SUT SILK 2 0 SH (SUTURE) ×3 IMPLANT
SUT VIC AB 3-0 SH 27 (SUTURE) ×6
SUT VIC AB 3-0 SH 27X BRD (SUTURE) ×1 IMPLANT
UNDERPAD 30X30 INCONTINENT (UNDERPADS AND DIAPERS) ×3 IMPLANT
WATER STERILE IRR 1000ML POUR (IV SOLUTION) ×3 IMPLANT

## 2015-03-28 NOTE — Anesthesia Postprocedure Evaluation (Signed)
  Anesthesia Post-op Note  Patient: [name] [name]  Procedure(s) Performed: Procedure(s): LEFT ARM SECOND STAGE BASCILIC VEIN TRANSPOSITION (Left)  Patient Location: PACU  Anesthesia Type:General  Level of Consciousness: awake and alert   Airway and Oxygen Therapy: Patient Spontanous Breathing  Post-op Pain: mild  Post-op Assessment: Post-op Vital signs reviewed  Post-op Vital Signs: stable  Last Vitals:  Filed Vitals:   03/28/15 1218  BP: 126/58  Pulse: 78  Temp:   Resp:     Complications: No apparent anesthesia complications

## 2015-03-28 NOTE — Anesthesia Preprocedure Evaluation (Addendum)
Anesthesia Evaluation  Patient identified by MRN, date of birth, ID band Patient awake    Reviewed: Allergy & Precautions, NPO status , Patient's Chart, lab work & pertinent test results  Airway Mallampati: II  TM Distance: >3 FB Neck ROM: Full    Dental  (+) Teeth Intact   Pulmonary former smoker,  breath sounds clear to auscultation  + decreased breath sounds- wheezing      Cardiovascular hypertension, +CHF Rhythm:Regular Rate:Normal     Neuro/Psych    GI/Hepatic   Endo/Other  diabetes  Renal/GU ESRF and DialysisRenal disease     Musculoskeletal   Abdominal   Peds  Hematology   Anesthesia Other Findings   Reproductive/Obstetrics                            Anesthesia Physical Anesthesia Plan  ASA: III  Anesthesia Plan: General and General LMA   Post-op Pain Management:    Induction: Intravenous  Airway Management Planned: LMA  Additional Equipment:   Intra-op Plan:   Post-operative Plan: Extubation in OR  Informed Consent: I have reviewed the patients History and Physical, chart, labs and discussed the procedure including the risks, benefits and alternatives for the proposed anesthesia with the patient or authorized representative who has indicated his/her understanding and acceptance.     Plan Discussed with:   Anesthesia Plan Comments:         Anesthesia Quick Evaluation

## 2015-03-28 NOTE — Transfer of Care (Signed)
Immediate Anesthesia Transfer of Care Note  Patient: [name] [name]  Procedure(s) Performed: Procedure(s): LEFT ARM SECOND STAGE BASCILIC VEIN TRANSPOSITION (Left)  Patient Location: PACU  Anesthesia Type:General  Level of Consciousness: awake, alert , oriented and patient cooperative  Airway & Oxygen Therapy: Patient Spontanous Breathing  Post-op Assessment: Report given to RN, Post -op Vital signs reviewed and stable and Patient moving all extremities  Post vital signs: Reviewed and stable  Last Vitals:  Filed Vitals:   03/28/15 0720  BP: 137/62  Pulse: 72  Temp: 36.5 C  Resp: 18    Complications: No apparent anesthesia complications

## 2015-03-28 NOTE — H&P (View-Only) (Signed)
Patient just today for follow-up of first stage basilic vein fistula. She had the basilic vein fistula creation by myself on 02/14/2015. She had a catheter placed couple weeks earlier is been on hemodialysis since that time. She reports no tenderness associated with her fistula. She has no steal symptoms.  Physical exam: Her left antecubital incision is healed quite nicely. She has an easily palpable radial pulse. Has excellent thrill in her upper arm.  She underwent formal duplex of her fistula and also imaged this myself with SonoSite. This shows nice maturation with a 6 mm basilic vein from the antecubital fossa proximally.  Impression and plan: Successful first stage of basilic vein fistula. Explained the need now for mobilization since she has had a good size maturation. We will schedule her on a nondialysis day on Monday Wednesday or Friday with the next 1-2 weeks. Explained the procedures an outpatient. Also explained that we would see her back in one month following this we can initiate use of this for long-term access.

## 2015-03-28 NOTE — Interval H&P Note (Signed)
History and Physical Interval Note:  03/28/2015 7:59 AM  [name] [name]  has presented today for surgery, with the diagnosis of End Stage Renal Disease N18.6  The various methods of treatment have been discussed with the patient and family. After consideration of risks, benefits and other options for treatment, the patient has consented to  Procedure(s): LEFT ARM 2ND STAGE [place] (Left) as a surgical intervention .  The patient's history has been reviewed, patient examined, no change in status, stable for surgery.  I have reviewed the patient's chart and labs.  Questions were answered to the patient's satisfaction.     Curt Jews

## 2015-03-28 NOTE — Anesthesia Procedure Notes (Signed)
Procedure Name: LMA Insertion Date/Time: 03/28/2015 8:42 AM Performed by: Greggory Stallion, Needham Biggins L Pre-anesthesia Checklist: Patient identified, Emergency Drugs available, Suction available, Patient being monitored and Timeout performed Patient Re-evaluated:Patient Re-evaluated prior to inductionOxygen Delivery Method: Circle system utilized Preoxygenation: Pre-oxygenation with 100% oxygen Intubation Type: IV induction Ventilation: Mask ventilation without difficulty LMA: LMA inserted LMA Size: 4.0 Number of attempts: 1 Placement Confirmation: positive ETCO2 and breath sounds checked- equal and bilateral

## 2015-03-29 ENCOUNTER — Encounter (HOSPITAL_COMMUNITY): Payer: Self-pay | Admitting: Vascular Surgery

## 2015-03-30 ENCOUNTER — Telehealth: Payer: Self-pay | Admitting: Vascular Surgery

## 2015-03-30 NOTE — Telephone Encounter (Addendum)
-----   Message from Mena Goes, RN sent at 03/28/2015 10:31 AM EDT ----- Regarding: Schedule   ----- Message -----    From: Alvia Grove, PA-C    Sent: 03/28/2015  10:28 AM      To: [facility] Charge Pool  S/p 2nd stage BVT 03/27/15  F/u with Dr. Donnetta Hutching in 4-6 weeks with duplex  Thanks Kim   03/28/15: lm for pt re appt, dpm

## 2015-04-11 ENCOUNTER — Encounter: Payer: Self-pay | Admitting: *Deleted

## 2015-04-26 ENCOUNTER — Encounter (HOSPITAL_COMMUNITY): Payer: Managed Care, Other (non HMO)

## 2015-04-27 ENCOUNTER — Ambulatory Visit (HOSPITAL_COMMUNITY)
Admission: RE | Admit: 2015-04-27 | Discharge: 2015-04-27 | Disposition: A | Discharge status: Home / Self Care | Payer: Managed Care, Other (non HMO) | Source: Ambulatory Visit | Attending: Vascular Surgery | Admitting: Vascular Surgery

## 2015-04-27 ENCOUNTER — Encounter: Payer: Self-pay | Admitting: Vascular Surgery

## 2015-04-27 DIAGNOSIS — N186 End stage renal disease: Secondary | ICD-10-CM | POA: Insufficient documentation

## 2015-04-27 DIAGNOSIS — Z4931 Encounter for adequacy testing for hemodialysis: Secondary | ICD-10-CM | POA: Insufficient documentation

## 2015-05-01 ENCOUNTER — Ambulatory Visit (INDEPENDENT_AMBULATORY_CARE_PROVIDER_SITE_OTHER): Payer: Self-pay | Admitting: Vascular Surgery

## 2015-05-01 ENCOUNTER — Encounter: Payer: Self-pay | Admitting: Vascular Surgery

## 2015-05-01 VITALS — BP 167/92 | HR 79 | Ht 65.5 in | Wt 127.0 lb

## 2015-05-01 DIAGNOSIS — Z0279 Encounter for issue of other medical certificate: Secondary | ICD-10-CM | POA: Diagnosis not present

## 2015-05-01 DIAGNOSIS — N186 End stage renal disease: Secondary | ICD-10-CM

## 2015-05-01 DIAGNOSIS — Z992 Dependence on renal dialysis: Secondary | ICD-10-CM

## 2015-05-01 NOTE — Progress Notes (Signed)
Today for continued follow-up of her left upper arm basilic vein transposition. She had a second stage in the middle of May. Initially the fistula was created on 02/14/2015. She reports no trouble with her right IJ hemodialysis catheter. The dressing is intact and the entry site is without inflammation. Her left upper arm fistula is healing quite nicely. The brachial incision and the vein harvest incisions are all healed. She has excellent maturation of this with good thrill and bruit present. The vein is straight and very superficial. She has good size maturation. I would recommend using this at total of 3 months which would be in the middle of July. If she has failure of her catheter prior to this she could begin use of this at any time. We will not see her again unless they are access concerns. We are available to remove her catheter once she has had successful use of her fistula.

## 2015-05-08 ENCOUNTER — Other Ambulatory Visit: Payer: Self-pay | Admitting: Family Medicine

## 2015-05-09 NOTE — Telephone Encounter (Signed)
Dr Lorelei Pont, I don't see that you have Rx'd for pt before. Do you want to?

## 2015-05-16 NOTE — Op Note (Signed)
    OPERATIVE REPORT  DATE OF SURGERY: 05/16/2015  PATIENT: [name] [name], 54 y.o. female MRN: [id number]  DOB: 21-Dec-1960  PRE-OPERATIVE DIAGNOSIS: End-stage renal disease  POST-OPERATIVE DIAGNOSIS:  Same  PROCEDURE: Second stage basilic vein transposition of left upper arm AV fistula  SURGEON:  Curt Jews, M.D.  PHYSICIAN ASSISTANT: Trinh  ANESTHESIA:  Gen.  EBL: Minimal ml     BLOOD ADMINISTERED: None  DRAINS: None  SPECIMEN: None  COUNTS CORRECT:  YES  PLAN OF CARE: PACU   PATIENT DISPOSITION:  PACU - hemodynamically stable  PROCEDURE DETAILS: The patient was taken to the operating room placed supine position where the area of the left arm was prepped and draped in usual sterile fashion. The prior basilic vein to brachial artery anastomosis. The vein was exposed this level and with using skin bridges the basilic vein was exposed up to the level of the axilla. The vein was of good caliber. Tributary branches were ligated with 30 and 4-0 silk ties and divided. The vein was mobilized circumferentially from the brachial artery anastomosis to the axilla. The vein was marked to prevent twisting. The artery was occluded proximal and distal to the old anastomosis and the vein was removed from the artery. The vein was brought through the tunnel. A subcuticular tunnel was created from the antecubital space to the axilla and the vein was brought through this tunnel. Again care was taken not to twist the vein. The vein was cut to appropriate length and was spatulated and sewn end-to-side to the brachial artery with a running 60 proline suture. Clamps were removed and excellent thrill was noted. The wounds were irrigated with saline. Hemostasis was obtained left cautery. The wounds were closed with 30 Vicryls in the subcutaneous and subcuticular tissue. Sterile dressing was applied the patient was transferred to the recovery room in stable condition   Curt Jews, M.D. 05/16/2015 3:30  PM

## 2015-06-29 ENCOUNTER — Ambulatory Visit (INDEPENDENT_AMBULATORY_CARE_PROVIDER_SITE_OTHER): Payer: PRIVATE HEALTH INSURANCE | Admitting: Family Medicine

## 2015-06-29 VITALS — BP 114/68 | HR 100 | Temp 98.3°F | Resp 16 | Ht 65.0 in | Wt 125.2 lb

## 2015-06-29 DIAGNOSIS — E875 Hyperkalemia: Secondary | ICD-10-CM

## 2015-06-29 DIAGNOSIS — E119 Type 2 diabetes mellitus without complications: Secondary | ICD-10-CM

## 2015-06-29 DIAGNOSIS — Z119 Encounter for screening for infectious and parasitic diseases, unspecified: Secondary | ICD-10-CM

## 2015-06-29 DIAGNOSIS — Z1239 Encounter for other screening for malignant neoplasm of breast: Secondary | ICD-10-CM

## 2015-06-29 DIAGNOSIS — Z0000 Encounter for general adult medical examination without abnormal findings: Secondary | ICD-10-CM | POA: Diagnosis not present

## 2015-06-29 DIAGNOSIS — Z13220 Encounter for screening for lipoid disorders: Secondary | ICD-10-CM

## 2015-06-29 DIAGNOSIS — Z130 Encounter for screening for diseases of the blood and blood-forming organs and certain disorders involving the immune mechanism: Secondary | ICD-10-CM

## 2015-06-29 DIAGNOSIS — Z124 Encounter for screening for malignant neoplasm of cervix: Secondary | ICD-10-CM

## 2015-06-29 NOTE — Progress Notes (Addendum)
[facility] [address] [place] [address] [phone]  Date:  06/29/2015   Name:  [name] [name]   DOB:  [birth year]   MRN:  [id number]  PCP:  Lamar Blinks, MD    Chief Complaint: Annual Exam   History of Present Illness:  [name] [name] is a 54 y.o. very pleasant female patient who presents with the following:  History of multiple medical problems as below. She is on dialysis for her ESRD.   Her last pap was 2-3 years ago.   Never had an abnormal pap that she can recall She still has some tingling in her left hand, but otherwise she is doing pretty well from her stroke that occurred last November.   Still has some trouble with her left leg from the stroke She last ate about 3 hours ago Lab Results  Component Value Date   HGBA1C 6.2* 01/18/2015   She does not have a diabetes doctor- DM is managed by diet only She thinks that her flu shot and pneumonia shot were both done at dialysis   Patient Active Problem List   Diagnosis Date Noted  . History of CVA (cerebrovascular accident) 02/20/2015  . ESRD on dialysis 02/19/2015  . Debilitated 02/15/2015  . Acute respiratory failure   . SVT (supraventricular tachycardia)   . Other specified hypotension   . Acute diastolic CHF (congestive heart failure)   . Protein-calorie malnutrition   . Debility 02/07/2015  . Encephalopathy acute   . Atelectasis, left   . Confusion   . Acute respiratory failure with hypoxia 01/22/2015  . ARDS (adult respiratory distress syndrome) 01/22/2015  . Tachycardia   . Weakness 01/19/2015  . Elevated troponin level 01/19/2015  . Hypotension 01/19/2015  . Absolute anemia   . Shortness of breath 01/18/2015  . Diarrhea 12/07/2014  . Hemorrhoid 12/07/2014  . HCAP (healthcare-associated pneumonia) 11/23/2014  . Fever   . Bleeding gastrointestinal   . Tremor 11/21/2014  . Protein-calorie malnutrition, severe 11/20/2014  . GIB (gastrointestinal bleeding) 11/19/2014  .  CKD (chronic kidney disease) stage 4, GFR 15-29 ml/min 11/19/2014  . Abdominal pain 11/19/2014  . Noninfectious gastroenteritis and colitis 11/19/2014  . Melena   . Acute blood loss anemia   . Acute on chronic renal failure   . Hyponatremia   . Hyperglycemia   . MCTD (mixed connective tissue disease)   . SLE (systemic lupus erythematosus)   . Secondary cardiomyopathy   . Noncompliance with medication regimen   . Tobacco abuse   . Sepsis 10/10/2014  . Leucocytosis 10/10/2014  . Fluid overload 10/10/2014  . Chronic kidney disease 10/10/2014  . SIRS (systemic inflammatory response syndrome) 10/10/2014  . Acute renal failure superimposed on stage 4 chronic kidney disease 10/10/2014  . Anemia 10/10/2014  . Hyperkalemia 10/10/2014  . Acute encephalopathy 10/10/2014  . Membranous glomerulonephritis   . Torticollis, acquired 10/06/2014  . Essential hypertension 10/05/2014  . Acute respiratory acidosis   . ICH (intracerebral hemorrhage)   . Chronic ankle pain   . Cerebral hemorrhage   . Malignant hypertension   . History of stroke 09/28/2014  . 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 Hypertensive emergency 09/28/2014  . Iron deficiency anemia 02/18/2008  . Mononeuritis of lower limb 02/18/2008  . History of adrenal insufficiency 02/18/2008  . DIABETES MELLITUS, TYPE II 10/25/2007  . HYPERLIPIDEMIA 10/25/2007  . UNSPECIFIED ULCERATION OF VULVA 10/25/2007  . Systemic lupus erythematosus 10/25/2007  . Rheumatoid arthritis 10/25/2007  . HYPERTENSION NEC  10/25/2007  . COLONIC POLYPS, HYPERPLASTIC 04/08/2006  . HEMORRHOIDS 04/08/2006    Past Medical History  Diagnosis Date  . Hypertension   . Lupus   . CKD (chronic kidney disease)     due to lupus/Dr. [name]  . Diabetes mellitus   . Stenosis of cervical spine region     with HNP at C5/6, C6/7  . Metatarsal bone fracture right    4th  . 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 Arthritis      RA  . Anemia   . Stroke 11/2014    left sided weakness, dysphagia  . Pain in joints   . Paresthesias   . Hyperlipidemia   . Colitis     ? per hospital notes 11/2014  . Lower GI bleed   . Hyponatremia   . Metabolic acidosis   . Hyperplastic colon polyp   . Hemorrhoids   . Blood transfusion without reported diagnosis   . Myocardial infarction     Past Surgical History  Procedure Laterality Date  . Breast biopsy    . Tubal ligation    . Insertion of dialysis catheter Right 02/05/2015    Procedure: INSERTION OF DIALYSIS CATHETER - RIGHT INTERNAL JUGULAR;  Surgeon: Conrad [place], MD;  Location: [facility];  Service: Vascular;  Laterality: Right;  . Bascilic vein transposition Left 02/14/2015    Procedure: BASILIC VEIN Fistula Creation First Stage;  Surgeon: Rosetta Posner, MD;  Location: [facility];  Service: Vascular;  Laterality: Left;  . Colonoscopy w/ biopsies and polypectomy    . Bascilic vein transposition Left 03/28/2015    Procedure: LEFT ARM SECOND STAGE BASCILIC VEIN TRANSPOSITION;  Surgeon: Rosetta Posner, MD;  [facility];  Service: Vascular;  Laterality: Left;    Social History  Substance Use Topics  . Smoking status: Former Smoker -- 0.50 packs/day for 30 years    Types: Cigarettes    Quit date: 09/22/2014  . Smokeless tobacco: Never Used  . Alcohol Use: No    Family History  Problem Relation Age of Onset  . Hypertension    . Lupus    . Rheum arthritis    . Hypertension Mother   . Diabetes Mother   . Hypertension Sister   . Diabetes Father   . Hypertension Maternal Grandmother     Allergies  Allergen Reactions  . Sulfa Antibiotics Itching    Medication list has been reviewed and updated.  Current Outpatient Prescriptions on File Prior to Visit  Medication Sig Dispense Refill  . acetaminophen (TYLENOL) 325 MG tablet Take 2 tablets (650 mg total) by mouth every 4 (four) hours as needed for mild pain.    Marland Kitchen ALPRAZolam (XANAX) 0.25 MG tablet Take 1 tablet (0.25 mg  total) by mouth every 8 (eight) hours as needed for anxiety (0.25mg -0.5mg ). 60 tablet 0  . hydroxychloroquine (PLAQUENIL) 200 MG tablet Take 1 tablet (200 mg total) by mouth 2 (two) times daily. 60 tablet 1  . LYRICA 75 MG capsule take 1 capsule by mouth once daily 30 capsule 1  . multivitamin (RENA-VIT) TABS tablet Take 1 tablet by mouth daily.    Marland Kitchen oxyCODONE-acetaminophen (PERCOCET/ROXICET) 5-325 MG per tablet Take 1 tablet by mouth every 4 (four) hours as needed for moderate pain. 30 tablet 0  . pantoprazole (PROTONIX) 40 MG tablet Take 1 tablet (40 mg total) by mouth daily. 30 tablet 11  .  sevelamer carbonate (RENVELA) 800 MG tablet Take 1 tablet (800 mg total) by mouth 3 (three) times daily with meals. 90 tablet 1  . atorvastatin (LIPITOR) 20 MG tablet Take 1 tablet (20 mg total) by mouth daily at 6 PM. (Patient not taking: Reported on 06/29/2015) 30 tablet 1  . labetalol (NORMODYNE) 300 MG tablet Take 1 tablet (300 mg total) by mouth every 8 (eight) hours. (Patient not taking: Reported on 06/29/2015) 90 tablet 1  . metoprolol (LOPRESSOR) 100 MG tablet Take 100 mg by mouth 2 (two) times daily.    Marland Kitchen saccharomyces boulardii (FLORASTOR) 250 MG capsule Take 1 capsule (250 mg total) by mouth 2 (two) times daily. (Patient not taking: Reported on 06/29/2015) 60 capsule 0  . thiamine 100 MG tablet Take 1 tablet (100 mg total) by mouth daily. (Patient not taking: Reported on 06/29/2015) 30 tablet 1   No current facility-administered medications on file prior to visit.    Review of Systems:  As per HPI- otherwise negative.   Physical Examination: Filed Vitals:   06/29/15 1446  BP: 114/68  Pulse: 100  Temp: 98.3 F (36.8 C)  Resp: 16   Filed Vitals:   06/29/15 1446  Height: 5\' 5"  (1.651 m)  Weight: 125 lb 3.2 oz (56.79 kg)   Body mass index is 20.83 kg/(m^2). Ideal Body Weight: Weight in (lb) to have BMI = 25: 149.9  GEN: WDWN, NAD, Non-toxic, A & O x 3, thin, debilitated  appearance HEENT: Atraumatic, Normocephalic. Neck supple. No masses, No LAD.  Bilateral TM wnl, oropharynx normal.  PEERL,EOMI.   Ears and Nose: No external deformity. CV: RRR, No M/G/R. No JVD. No thrill. No extra heart sounds. PULM: CTA B, no wheezes, crackles, rhonchi. No retractions. No resp. distress. No accessory muscle use. She has a port in her right subclavian ABD: S, NT, ND. No rebound. No HSM. EXTR: No c/c/e NEURO Normal gait.  PSYCH: Normally interactive. Conversant. Not depressed or anxious appearing.  Calm demeanor.  Breast: normal exam, no masses/ dimpling/ discharge Pelvic: normal, no vaginal lesions or discharge. Uterus normal, no CMT, no adnexal tendereness or masses  Assessment and Plan: Physical exam  Screening for cervical cancer - Plan: Pap IG and HPV (high risk) DNA detection  Screening for hyperlipidemia - Plan: Lipid panel  Screening for deficiency anemia - Plan: CBC  Diabetes mellitus type 2, controlled - Plan: Comprehensive metabolic panel, Hemoglobin A1c  Screening examination for infectious disease - Plan: Hepatitis C antibody  Screening for breast cancer - Plan: MM Digital Screening  CPE as above today Await labs She will check, but thinks that she already received her flu and pneumonia vaccines at dialysis center  [place], MD  Called 8/29- let her know that her A1c looks good, but K is a little high.  Asked her to come in for a repeat level this week. However she thinks this is monitored every time she has dialysis, and she will go tomorrow.  She wrote down the K level for the dialysis staff.  I will place an order for a repeat K in case they do not monitor this for her or what it rechecked here. Await her pap, will fax the rest of her labs to [facility] for their records

## 2015-06-29 NOTE — Patient Instructions (Signed)
Great to see you today I will be in touch with your labs asap Please ask your eye doctor to send me a report about your upcoming visit Please be sure that you got your pneumonia and flu shots at dialysis- if you did not we can do these for you

## 2015-06-30 LAB — COMPREHENSIVE METABOLIC PANEL
ALBUMIN: 3.9 g/dL (ref 3.6–5.1)
ALK PHOS: 103 U/L (ref 33–130)
ALT: 9 U/L (ref 6–29)
AST: 17 U/L (ref 10–35)
BUN: 58 mg/dL — AB (ref 7–25)
CHLORIDE: 101 mmol/L (ref 98–110)
CO2: 21 mmol/L (ref 20–31)
Calcium: 9.2 mg/dL (ref 8.6–10.4)
Creat: 9.61 mg/dL — ABNORMAL HIGH (ref 0.50–1.05)
Glucose, Bld: 74 mg/dL (ref 65–99)
Potassium: 5.6 mmol/L — ABNORMAL HIGH (ref 3.5–5.3)
Sodium: 137 mmol/L (ref 135–146)
TOTAL PROTEIN: 8 g/dL (ref 6.1–8.1)
Total Bilirubin: 0.4 mg/dL (ref 0.2–1.2)

## 2015-06-30 LAB — LIPID PANEL
CHOLESTEROL: 154 mg/dL (ref 125–200)
HDL: 39 mg/dL — ABNORMAL LOW (ref >=46)
LDL Cholesterol: 81 mg/dL (ref <130)
TRIGLYCERIDES: 169 mg/dL — AB (ref <150)
Total CHOL/HDL Ratio: 3.9 Ratio (ref <=5.0)
VLDL: 34 mg/dL — AB (ref <30)

## 2015-06-30 LAB — CBC
HEMATOCRIT: 31.5 % — AB (ref 36.0–46.0)
Hemoglobin: 9.4 g/dL — ABNORMAL LOW (ref 12.0–15.0)
MCH: 26.7 pg (ref 26.0–34.0)
MCHC: 29.8 g/dL — ABNORMAL LOW (ref 30.0–36.0)
MCV: 89.5 fL (ref 78.0–100.0)
MPV: 11.3 fL (ref 8.6–12.4)
Platelets: 239 10*3/uL (ref 150–400)
RBC: 3.52 MIL/uL — AB (ref 3.87–5.11)
RDW: 18.6 % — ABNORMAL HIGH (ref 11.5–15.5)
WBC: 6.5 10*3/uL (ref 4.0–10.5)

## 2015-06-30 LAB — HEMOGLOBIN A1C
Hgb A1c MFr Bld: 4.7 % (ref <5.7)
Mean Plasma Glucose: 88 mg/dL (ref <117)

## 2015-06-30 LAB — HEPATITIS C ANTIBODY: HCV Ab: NEGATIVE

## 2015-07-02 ENCOUNTER — Other Ambulatory Visit: Payer: Self-pay | Admitting: *Deleted

## 2015-07-02 MED ORDER — HYDROXYCHLOROQUINE SULFATE 200 MG PO TABS: 200.0000 mg | ORAL_TABLET | Freq: Two times a day (BID) | ORAL | Status: DC

## 2015-07-02 NOTE — Telephone Encounter (Signed)
Pt not seen in clinic does not have any future appts

## 2015-07-02 NOTE — Addendum Note (Signed)
Addended by: Lamar Blinks C on: 07/02/2015 05:20 PM   Modules accepted: Orders

## 2015-07-03 ENCOUNTER — Other Ambulatory Visit: Payer: Self-pay

## 2015-07-03 DIAGNOSIS — Z1231 Encounter for screening mammogram for malignant neoplasm of breast: Secondary | ICD-10-CM

## 2015-07-03 LAB — PAP IG AND HPV HIGH-RISK: HPV DNA HIGH RISK: NOT DETECTED

## 2015-07-04 ENCOUNTER — Encounter: Payer: Self-pay | Admitting: Family Medicine

## 2015-07-14 ENCOUNTER — Other Ambulatory Visit: Payer: Self-pay | Admitting: Family Medicine

## 2015-07-14 DIAGNOSIS — G609 Hereditary and idiopathic neuropathy, unspecified: Secondary | ICD-10-CM

## 2015-07-16 ENCOUNTER — Telehealth: Payer: Self-pay

## 2015-07-16 NOTE — Telephone Encounter (Signed)
Pt received a call from [place]. I can't tell why other than a LYRICA 75 MG capsule [[id number]] script. She didn't know about this. Please advise [phone]

## 2015-07-18 NOTE — Telephone Encounter (Signed)
Called and spoke with her- she has used lyrica for about a month now and it is helping with her foot pain.  She would like to continue taking this, will refill for her

## 2015-07-19 ENCOUNTER — Telehealth: Payer: Self-pay | Admitting: Gastroenterology

## 2015-07-19 NOTE — Telephone Encounter (Signed)
This was handled already

## 2015-07-19 NOTE — Telephone Encounter (Signed)
Dr Ardis Hughs can you please advise on this pt?

## 2015-07-20 ENCOUNTER — Other Ambulatory Visit: Payer: Self-pay | Admitting: Nephrology

## 2015-07-20 ENCOUNTER — Other Ambulatory Visit (HOSPITAL_COMMUNITY): Payer: Self-pay | Admitting: Nephrology

## 2015-07-20 ENCOUNTER — Encounter: Payer: Self-pay | Admitting: Gastroenterology

## 2015-07-20 DIAGNOSIS — Z005 Encounter for examination of potential donor of organ and tissue: Secondary | ICD-10-CM

## 2015-07-20 DIAGNOSIS — Z01810 Encounter for preprocedural cardiovascular examination: Secondary | ICD-10-CM

## 2015-07-20 NOTE — Telephone Encounter (Signed)
OK, that is fine.  She needs [facility] colonoscoyp for colon cancer screening.  Thanks

## 2015-07-20 NOTE — Telephone Encounter (Signed)
[facility] with [facility] and pt scheduled for previsit 07/23/15@10am , colon scheduled in the [facility] 07/30/15@8 :30am. Office to call pt with appts.

## 2015-07-30 ENCOUNTER — Encounter: Payer: Managed Care, Other (non HMO) | Admitting: Gastroenterology

## 2015-08-01 ENCOUNTER — Ambulatory Visit (HOSPITAL_BASED_OUTPATIENT_CLINIC_OR_DEPARTMENT_OTHER): Payer: Managed Care, Other (non HMO)

## 2015-08-01 ENCOUNTER — Other Ambulatory Visit (HOSPITAL_COMMUNITY): Payer: Self-pay | Admitting: Nephrology

## 2015-08-01 ENCOUNTER — Ambulatory Visit (HOSPITAL_COMMUNITY): Payer: Managed Care, Other (non HMO) | Attending: Cardiology

## 2015-08-01 DIAGNOSIS — Z01810 Encounter for preprocedural cardiovascular examination: Secondary | ICD-10-CM | POA: Insufficient documentation

## 2015-08-01 DIAGNOSIS — N186 End stage renal disease: Secondary | ICD-10-CM | POA: Insufficient documentation

## 2015-08-01 DIAGNOSIS — R0989 Other specified symptoms and signs involving the circulatory and respiratory systems: Secondary | ICD-10-CM

## 2015-08-01 MED ORDER — DOBUTAMINE INFUSION FOR EP/ECHO/NUC (1000 MCG/ML)
40.0000 ug/kg/min | Freq: Once | INTRAVENOUS | Status: AC
  Administered 2015-08-01: 40 ug/kg/min via INTRAVENOUS

## 2015-08-01 MED ORDER — ATROPINE SULFATE 0.1 MG/ML IJ SOLN
0.2500 mg | Freq: Once | INTRAMUSCULAR | Status: AC
  Administered 2015-08-01: 0.25 mg via INTRAVENOUS

## 2015-08-03 ENCOUNTER — Ambulatory Visit
Admission: RE | Admit: 2015-08-03 | Discharge: 2015-08-03 | Disposition: A | Discharge status: Home / Self Care | Payer: Managed Care, Other (non HMO) | Source: Ambulatory Visit | Attending: Nephrology | Admitting: Nephrology

## 2015-08-03 DIAGNOSIS — Z005 Encounter for examination of potential donor of organ and tissue: Secondary | ICD-10-CM

## 2015-09-12 ENCOUNTER — Encounter: Payer: Self-pay | Admitting: Gastroenterology

## 2015-09-20 ENCOUNTER — Other Ambulatory Visit: Payer: Self-pay

## 2015-09-20 ENCOUNTER — Other Ambulatory Visit: Payer: Self-pay | Admitting: Family Medicine

## 2015-09-20 DIAGNOSIS — M79642 Pain in left hand: Secondary | ICD-10-CM

## 2015-09-20 DIAGNOSIS — N186 End stage renal disease: Secondary | ICD-10-CM

## 2015-09-20 DIAGNOSIS — T82510S Breakdown (mechanical) of surgically created arteriovenous fistula, sequela: Secondary | ICD-10-CM

## 2015-09-20 DIAGNOSIS — E559 Vitamin D deficiency, unspecified: Secondary | ICD-10-CM

## 2015-09-21 ENCOUNTER — Other Ambulatory Visit: Payer: Self-pay

## 2015-09-21 ENCOUNTER — Ambulatory Visit (HOSPITAL_COMMUNITY)
Admission: RE | Admit: 2015-09-21 | Discharge: 2015-09-21 | Disposition: A | Discharge status: Home / Self Care | Payer: Managed Care, Other (non HMO) | Source: Ambulatory Visit | Attending: Vascular Surgery | Admitting: Vascular Surgery

## 2015-09-21 ENCOUNTER — Encounter: Payer: Self-pay | Admitting: Vascular Surgery

## 2015-09-21 ENCOUNTER — Ambulatory Visit (INDEPENDENT_AMBULATORY_CARE_PROVIDER_SITE_OTHER): Payer: Managed Care, Other (non HMO) | Admitting: Vascular Surgery

## 2015-09-21 VITALS — BP 128/63 | HR 85 | Temp 98.3°F | Resp 16 | Ht 64.5 in | Wt 126.0 lb

## 2015-09-21 DIAGNOSIS — T82898A Other specified complication of vascular prosthetic devices, implants and grafts, initial encounter: Secondary | ICD-10-CM

## 2015-09-21 DIAGNOSIS — T82510S Breakdown (mechanical) of surgically created arteriovenous fistula, sequela: Secondary | ICD-10-CM

## 2015-09-21 DIAGNOSIS — Z992 Dependence on renal dialysis: Secondary | ICD-10-CM | POA: Diagnosis not present

## 2015-09-21 DIAGNOSIS — N186 End stage renal disease: Secondary | ICD-10-CM | POA: Insufficient documentation

## 2015-09-21 DIAGNOSIS — M79642 Pain in left hand: Secondary | ICD-10-CM | POA: Diagnosis not present

## 2015-09-21 NOTE — Telephone Encounter (Signed)
Dr Lorelei Pont, it looks like you saw pt for check up/labs in Aug to est care with you as PCP. I don't see vit D lab. OK to give RFs, or RTC?

## 2015-09-21 NOTE — Progress Notes (Addendum)
Vascular and Vein Specialist of [place]  Patient name: [name] [name] MRN: [id number] DOB: [birth year] Sex: female  REASON FOR VISIT: left hand pain and numbness during dialysis  HPI: [name] [name] is a 54 y.o. female who underwent left second stage basilic vein transposition on 05/16/15. The fistula was originally created on 02/14/15. She started using her fistula for a few months now. She dialyzes on TTS. She reports that her fistula has been difficult to cannulate and the "vein collapses." Therefore she was sent to [facility] at the end of the October and again on 09/10/15  "to stretch her vein." Following the second procedure, she has reported increasing numbness and coldness to her left hand, especially at the fingertips. This is particularly severe with dialysis and she has required early termination of her treatment. She also reports a couple week history of weakness in left arm, specifically when trying to pick up heavy things. She says her "left arm gives out." She also complains of some chronic numbness of her left leg which has been present for a year. 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   She has a PMH of lupus, hypertension, diabetes and CVA.   Past Medical History  Diagnosis Date  . Hypertension   . Lupus ([place])   . CKD (chronic kidney disease)     due to lupus/Dr. [name]  . Diabetes mellitus ([place])   . Stenosis of cervical spine region     with HNP at C5/6, C6/7  . Metatarsal bone fracture right    4th  . 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 Arthritis     RA  . Anemia   . Stroke ([place]) 11/2014    left sided weakness, dysphagia  . Pain in joints   . Paresthesias   . Hyperlipidemia   . Colitis     ? per hospital notes 11/2014  . Lower GI bleed   . Hyponatremia   . Metabolic acidosis   . Hyperplastic colon polyp   . Hemorrhoids   . Blood transfusion without reported diagnosis   .  Myocardial infarction [facility])     Family History  Problem Relation Age of Onset  . Hypertension    . Lupus    . Rheum arthritis    . Hypertension Mother   . Diabetes Mother   . Hypertension Sister   . Diabetes Father   . Hypertension Maternal Grandmother     SOCIAL HISTORY: Social History  Substance Use Topics  . Smoking status: Former Smoker -- 0.50 packs/day for 30 years    Types: Cigarettes    Quit date: 09/22/2014  . Smokeless tobacco: Never Used  . Alcohol Use: No    Allergies  Allergen Reactions  . Sulfa Antibiotics Itching    Current Outpatient Prescriptions  Medication Sig Dispense Refill  . acetaminophen (TYLENOL) 325 MG tablet Take 2 tablets (650 mg total) by mouth every 4 (four) hours as needed for mild pain.    Marland Kitchen ALPRAZolam (XANAX) 0.25 MG tablet Take 1 tablet (0.25 mg total) by mouth every 8 (eight) hours as needed for anxiety (0.25mg -0.5mg ). 60 tablet 0  . atorvastatin (LIPITOR) 20 MG tablet Take 1 tablet (20 mg total) by mouth daily at 6 PM. 30 tablet 1  . cinacalcet (SENSIPAR) 30 MG tablet Take 30 mg by mouth daily.    Marland Kitchen docusate sodium (COLACE)  50 MG capsule Take 50 mg by mouth 2 (two) times daily.    . hydroxychloroquine (PLAQUENIL) 200 MG tablet Take 1 tablet (200 mg total) by mouth 2 (two) times daily. 60 tablet 1  . lidocaine-prilocaine (EMLA) cream Apply 1 application topically as needed.    Marland Kitchen LYRICA 75 MG capsule take 1 capsule by mouth once daily 30 capsule 3  . multivitamin (RENA-VIT) TABS tablet Take 1 tablet by mouth daily.    Marland Kitchen oxyCODONE-acetaminophen (PERCOCET/ROXICET) 5-325 MG per tablet Take 1 tablet by mouth every 4 (four) hours as needed for moderate pain. 30 tablet 0  . pantoprazole (PROTONIX) 40 MG tablet Take 1 tablet (40 mg total) by mouth daily. 30 tablet 11  . pravastatin (PRAVACHOL) 20 MG tablet Take 20 mg by mouth daily.    . sevelamer carbonate (RENVELA) 800 MG tablet Take 1 tablet (800 mg total) by mouth 3 (three) times daily with  meals. 90 tablet 1  . sorbitol 70 % solution Take 15 mLs by mouth daily as needed.    . temazepam (RESTORIL) 30 MG capsule Take 30 mg by mouth at bedtime as needed for sleep.    Marland Kitchen thiamine 100 MG tablet Take 1 tablet (100 mg total) by mouth daily. 30 tablet 1   No current facility-administered medications for this visit.    REVIEW OF SYSTEMS:  [X]  denotes positive finding, [ ]  denotes negative finding Cardiac  Comments:  Chest pain or chest pressure:    Shortness of breath upon exertion:    Short of breath when lying flat:    Irregular heart rhythm:        Vascular    Pain in calf, thigh, or hip brought on by ambulation:    Pain in feet at night that wakes you up from your sleep:     Blood clot in your veins:    Leg swelling:         Pulmonary    Oxygen at home:    Productive cough:     Wheezing:         Neurologic    Sudden weakness in arms or legs:     Sudden numbness in arms or legs:   Numbness for two weeks  Sudden onset of difficulty speaking or slurred speech:    Temporary loss of vision in one eye:     Problems with dizziness:         Gastrointestinal    Blood in stool:     Vomited blood:         Genitourinary    Burning when urinating:     Blood in urine:        Psychiatric    Major depression:         Hematologic    Bleeding problems:    Problems with blood clotting too easily:        Skin    Rashes or ulcers:        Constitutional    Fever or chills:      PHYSICAL EXAM: Filed Vitals:   09/21/15 1526  BP: 128/63  Pulse: 85  Temp: 98.3 F (36.8 C)  TempSrc: Oral  Resp: 16  Height: 5' 4.5" (1.638 m)  Weight: 126 lb (57.153 kg)  SpO2: 99%    GENERAL: The patient is a well-nourished female, in no acute distress. The vital signs are documented above. CARDIAC: There is a regular rate and rhythm. Harsh left carotid bruit. Left clavicular bruit. Soft right  carotid bruit.  VASCULAR: Non palpable left radial pulse. Palpable thrill left upper arm  fistula, strongest at anastomosis.  PULMONARY: There is good air exchange bilaterally without wheezing or rales. MUSCULOSKELETAL: There are no major deformities or cyanosis. NEUROLOGIC: Decreased sensation to left hand. Left grip strength 5/5.  SKIN: There are no ulcers or rashes noted. PSYCHIATRIC: The patient has a normal affect.  DATA:  Upper Extremity Arterial Duplex 09/21/15 LUE: Monophasic waveforms of left radial and ulnar arteries. Wrist brachial index 0.36. Left radial artery pressure increased from 91mm/Hg at rest to 98 mmHg with fistula compression. RUE: Triphasic brachial, radial and ulnar waveforms. Wrist brachial index 1.10  MEDICAL ISSUES:  Steal syndrome left brachial-cephalic fistula Numbness and coldness starting after two procedures, presumably venoplasty by [facility] two weeks ago. Patient is on HD TTS. Plan for plication versus banding of left brachial-cephalic AVF with Dr. [name] on 09/26/15. If this does not help to improve her symptoms, will need to ligate fistula.   Will also obtain carotid duplex at post-operative visit given bruits on exam.   Alvia Grove [facility] [place] Beeper: [phone]  Addendum  I have independently interviewed and examined the patient, and I agree with the physician assistant's findings.  Pt's sx are concerning for steal and her steal study is consistent with such.  In this young patient, I don't agree with DRIL as this patient is likely to have another 20-30 years at least to live.  There is no published series demonstrating this length of durability with DRIL.  Pt not ready to give up on the fistula, so would try to plicate the distal anastomosis to narrow it down vs banding the fistula.  The patient is aware the risk include but are not limited to bleeding, infection, thrombosis, and need for additional procedure including ligation of the fistula if the plication or banding fails.  Unfortunately, I will not  be available Wednesday, so the procedure is being scheduled with Dr. [name].    Adele Barthel, MD [facility] Vein Specialists of [place] Office: [phone] Pager: [phone]  09/21/2015, 4:44 PM

## 2015-09-24 ENCOUNTER — Encounter: Payer: Managed Care, Other (non HMO) | Admitting: Gastroenterology

## 2015-09-24 NOTE — Addendum Note (Signed)
Addended by: Dorthula Rue L on: 09/24/2015 09:57 AM   Modules accepted: Orders

## 2015-09-24 NOTE — Telephone Encounter (Signed)
Called and LMOM- let's check her Vitamin D level prior to going back on the high dose vit D.   I will place a lab order for this

## 2015-09-25 ENCOUNTER — Encounter (HOSPITAL_COMMUNITY): Payer: Self-pay | Admitting: *Deleted

## 2015-09-25 MED ORDER — SODIUM CHLORIDE 0.9 % IV SOLN: INTRAVENOUS | Status: DC

## 2015-09-25 MED ORDER — DEXTROSE 5 % IV SOLN
1.5000 g | INTRAVENOUS | Status: AC
  Administered 2015-09-26: 1.5 g via INTRAVENOUS
  Filled 2015-09-25: qty 1.5

## 2015-09-25 MED ORDER — CHLORHEXIDINE GLUCONATE CLOTH 2 % EX PADS: 6.0000 | MEDICATED_PAD | Freq: Once | CUTANEOUS | Status: DC

## 2015-09-26 ENCOUNTER — Encounter (HOSPITAL_COMMUNITY)
Admission: RE | Disposition: A | Discharge status: Home / Self Care | Payer: Self-pay | Source: Ambulatory Visit | Attending: Vascular Surgery

## 2015-09-26 ENCOUNTER — Ambulatory Visit (HOSPITAL_COMMUNITY)
Admission: RE | Admit: 2015-09-26 | Discharge: 2015-09-26 | Disposition: A | Discharge status: Home / Self Care | Payer: Managed Care, Other (non HMO) | Source: Ambulatory Visit | Attending: Vascular Surgery | Admitting: Vascular Surgery

## 2015-09-26 ENCOUNTER — Encounter (HOSPITAL_COMMUNITY): Payer: Self-pay | Admitting: *Deleted

## 2015-09-26 ENCOUNTER — Ambulatory Visit (HOSPITAL_COMMUNITY): Payer: Managed Care, Other (non HMO) | Admitting: Certified Registered"

## 2015-09-26 ENCOUNTER — Telehealth: Payer: Self-pay | Admitting: Vascular Surgery

## 2015-09-26 DIAGNOSIS — M79642 Pain in left hand: Secondary | ICD-10-CM | POA: Diagnosis present

## 2015-09-26 DIAGNOSIS — Z992 Dependence on renal dialysis: Secondary | ICD-10-CM | POA: Diagnosis not present

## 2015-09-26 DIAGNOSIS — M329 Systemic lupus erythematosus, unspecified: Secondary | ICD-10-CM | POA: Diagnosis not present

## 2015-09-26 DIAGNOSIS — N186 End stage renal disease: Secondary | ICD-10-CM | POA: Insufficient documentation

## 2015-09-26 DIAGNOSIS — E785 Hyperlipidemia, unspecified: Secondary | ICD-10-CM | POA: Insufficient documentation

## 2015-09-26 DIAGNOSIS — T82898A Other specified complication of vascular prosthetic devices, implants and grafts, initial encounter: Secondary | ICD-10-CM | POA: Insufficient documentation

## 2015-09-26 DIAGNOSIS — I509 Heart failure, unspecified: Secondary | ICD-10-CM | POA: Insufficient documentation

## 2015-09-26 DIAGNOSIS — Y832 Surgical operation with anastomosis, bypass or graft as the cause of abnormal reaction of the patient, or of later complication, without mention of misadventure at the time of the procedure: Secondary | ICD-10-CM | POA: Insufficient documentation

## 2015-09-26 DIAGNOSIS — E1122 Type 2 diabetes mellitus with diabetic chronic kidney disease: Secondary | ICD-10-CM | POA: Diagnosis not present

## 2015-09-26 DIAGNOSIS — Z79899 Other long term (current) drug therapy: Secondary | ICD-10-CM | POA: Diagnosis not present

## 2015-09-26 DIAGNOSIS — I132 Hypertensive heart and chronic kidney disease with heart failure and with stage 5 chronic kidney disease, or end stage renal disease: Secondary | ICD-10-CM | POA: Diagnosis not present

## 2015-09-26 DIAGNOSIS — I252 Old myocardial infarction: Secondary | ICD-10-CM | POA: Diagnosis not present

## 2015-09-26 DIAGNOSIS — Z87891 Personal history of nicotine dependence: Secondary | ICD-10-CM | POA: Insufficient documentation

## 2015-09-26 DIAGNOSIS — Z8673 Personal history of transient ischemic attack (TIA), and cerebral infarction without residual deficits: Secondary | ICD-10-CM | POA: Insufficient documentation

## 2015-09-26 HISTORY — PX: REVISON OF ARTERIOVENOUS FISTULA: SHX6074

## 2015-09-26 LAB — GLUCOSE, CAPILLARY
Glucose-Capillary: 71 mg/dL (ref 65–99)
Glucose-Capillary: 86 mg/dL (ref 65–99)

## 2015-09-26 LAB — POCT I-STAT 4, (NA,K, GLUC, HGB,HCT)
Glucose, Bld: 70 mg/dL (ref 65–99)
HCT: 38 % (ref 36.0–46.0)
HEMOGLOBIN: 12.9 g/dL (ref 12.0–15.0)
POTASSIUM: 3.5 mmol/L (ref 3.5–5.1)
Sodium: 138 mmol/L (ref 135–145)

## 2015-09-26 SURGERY — REVISON OF ARTERIOVENOUS FISTULA
Anesthesia: Monitor Anesthesia Care | Site: Arm Upper | Laterality: Left

## 2015-09-26 MED ORDER — OXYCODONE-ACETAMINOPHEN 5-325 MG PO TABS: 1.0000 | ORAL_TABLET | Freq: Four times a day (QID) | ORAL | Status: DC | PRN

## 2015-09-26 MED ORDER — 0.9 % SODIUM CHLORIDE (POUR BTL) OPTIME
TOPICAL | Status: DC | PRN
  Administered 2015-09-26: 1000 mL

## 2015-09-26 MED ORDER — LIDOCAINE HCL (CARDIAC) 20 MG/ML IV SOLN
INTRAVENOUS | Status: AC
  Filled 2015-09-26: qty 5

## 2015-09-26 MED ORDER — FENTANYL CITRATE (PF) 250 MCG/5ML IJ SOLN
INTRAMUSCULAR | Status: AC
  Filled 2015-09-26: qty 5

## 2015-09-26 MED ORDER — PROPOFOL 10 MG/ML IV BOLUS
INTRAVENOUS | Status: AC
  Filled 2015-09-26: qty 20

## 2015-09-26 MED ORDER — SODIUM CHLORIDE 0.9 % IV SOLN
INTRAVENOUS | Status: DC | PRN
  Administered 2015-09-26: 500 mL

## 2015-09-26 MED ORDER — LIDOCAINE-EPINEPHRINE (PF) 1 %-1:200000 IJ SOLN
INTRAMUSCULAR | Status: AC
  Filled 2015-09-26: qty 30

## 2015-09-26 MED ORDER — PROPOFOL 500 MG/50ML IV EMUL
INTRAVENOUS | Status: DC | PRN
  Administered 2015-09-26: 75 ug/kg/min via INTRAVENOUS

## 2015-09-26 MED ORDER — PHENYLEPHRINE HCL 10 MG/ML IJ SOLN
INTRAMUSCULAR | Status: DC | PRN
  Administered 2015-09-26 (×2): 80 ug via INTRAVENOUS

## 2015-09-26 MED ORDER — FENTANYL CITRATE (PF) 250 MCG/5ML IJ SOLN
INTRAMUSCULAR | Status: DC | PRN
  Administered 2015-09-26: 50 ug via INTRAVENOUS

## 2015-09-26 MED ORDER — LIDOCAINE HCL (CARDIAC) 20 MG/ML IV SOLN
INTRAVENOUS | Status: DC | PRN
  Administered 2015-09-26: 60 mg via INTRAVENOUS

## 2015-09-26 MED ORDER — MEPERIDINE HCL 25 MG/ML IJ SOLN: 6.2500 mg | INTRAMUSCULAR | Status: DC | PRN

## 2015-09-26 MED ORDER — FENTANYL CITRATE (PF) 100 MCG/2ML IJ SOLN: 25.0000 ug | INTRAMUSCULAR | Status: DC | PRN

## 2015-09-26 MED ORDER — LIDOCAINE-EPINEPHRINE (PF) 1 %-1:200000 IJ SOLN
INTRAMUSCULAR | Status: DC | PRN
  Administered 2015-09-26: 30 mL

## 2015-09-26 MED ORDER — MIDAZOLAM HCL 5 MG/5ML IJ SOLN
INTRAMUSCULAR | Status: DC | PRN
  Administered 2015-09-26: 2 mg via INTRAVENOUS

## 2015-09-26 MED ORDER — MIDAZOLAM HCL 2 MG/2ML IJ SOLN
INTRAMUSCULAR | Status: AC
  Filled 2015-09-26: qty 2

## 2015-09-26 MED ORDER — SODIUM CHLORIDE 0.9 % IV SOLN
INTRAVENOUS | Status: DC
  Administered 2015-09-26: 09:00:00 via INTRAVENOUS

## 2015-09-26 SURGICAL SUPPLY — 35 items
CANISTER SUCTION 2500CC (MISCELLANEOUS) ×3 IMPLANT
CLIP TI MEDIUM 6 (CLIP) ×3 IMPLANT
CLIP TI WIDE RED SMALL 6 (CLIP) ×3 IMPLANT
COVER PROBE W GEL 5X96 (DRAPES) IMPLANT
ELECT REM PT RETURN 9FT ADLT (ELECTROSURGICAL) ×3
ELECTRODE REM PT RTRN 9FT ADLT (ELECTROSURGICAL) ×1 IMPLANT
GAUZE SPONGE 4X4 12PLY STRL (GAUZE/BANDAGES/DRESSINGS) ×3 IMPLANT
GEL ULTRASOUND 20GR AQUASONIC (MISCELLANEOUS) IMPLANT
GLOVE BIO SURGEON STRL SZ7.5 (GLOVE) ×2 IMPLANT
GLOVE BIOGEL PI IND STRL 6.5 (GLOVE) IMPLANT
GLOVE BIOGEL PI IND STRL 7.5 (GLOVE) IMPLANT
GLOVE BIOGEL PI IND STRL 8 (GLOVE) IMPLANT
GLOVE BIOGEL PI INDICATOR 6.5 (GLOVE) ×2
GLOVE BIOGEL PI INDICATOR 7.5 (GLOVE) ×2
GLOVE BIOGEL PI INDICATOR 8 (GLOVE) ×2
GLOVE ECLIPSE 7.0 STRL STRAW (GLOVE) ×2 IMPLANT
GLOVE ECLIPSE STER SZ 5.5 (GLOVE) ×2 IMPLANT
GLOVE SS BIOGEL STRL SZ 7 (GLOVE) ×1 IMPLANT
GLOVE SUPERSENSE BIOGEL SZ 7 (GLOVE) ×2
GOWN BRE IMP SLV AUR XL STRL (GOWN DISPOSABLE) ×2 IMPLANT
GOWN STRL REUS W/ TWL LRG LVL3 (GOWN DISPOSABLE) ×3 IMPLANT
GOWN STRL REUS W/TWL LRG LVL3 (GOWN DISPOSABLE) ×12
GRAFT GORETEX 6X10 (Vascular Products) ×2 IMPLANT
KIT BASIN OR (CUSTOM PROCEDURE TRAY) ×3 IMPLANT
KIT ROOM TURNOVER OR (KITS) ×3 IMPLANT
LIQUID BAND (GAUZE/BANDAGES/DRESSINGS) ×3 IMPLANT
NS IRRIG 1000ML POUR BTL (IV SOLUTION) ×3 IMPLANT
PACK CV ACCESS (CUSTOM PROCEDURE TRAY) ×3 IMPLANT
PAD ARMBOARD 7.5X6 YLW CONV (MISCELLANEOUS) ×6 IMPLANT
SUT PROLENE 6 0 BV (SUTURE) ×3 IMPLANT
SUT PROLENE 6 0 CC (SUTURE) ×2 IMPLANT
SUT VIC AB 3-0 SH 27 (SUTURE) ×3
SUT VIC AB 3-0 SH 27X BRD (SUTURE) ×1 IMPLANT
UNDERPAD 30X30 INCONTINENT (UNDERPADS AND DIAPERS) ×3 IMPLANT
WATER STERILE IRR 1000ML POUR (IV SOLUTION) ×3 IMPLANT

## 2015-09-26 NOTE — Anesthesia Postprocedure Evaluation (Signed)
Anesthesia Post Note  Patient: [name] [name]  Procedure(s) Performed: Procedure(s) (LRB): Revision Left Basilic Vein Transposition with Banding using Gortex graft (Left)  Patient location during evaluation: PACU Anesthesia Type: General Level of consciousness: awake and alert Pain management: pain level controlled Vital Signs Assessment: post-procedure vital signs reviewed and stable Respiratory status: spontaneous breathing, nonlabored ventilation, respiratory function stable and patient connected to nasal cannula oxygen Cardiovascular status: blood pressure returned to baseline and stable Postop Assessment: No signs of nausea or vomiting Anesthetic complications: no    Last Vitals:  Filed Vitals:   09/26/15 1316 09/26/15 1318  BP:  147/60  Pulse: 74 72  Temp:    Resp: 12 13    Last Pain:  Filed Vitals:   09/26/15 1325  PainSc: 0-No pain                 Anatole Apollo A

## 2015-09-26 NOTE — Telephone Encounter (Signed)
Spoke with pt, dpm °

## 2015-09-26 NOTE — Op Note (Signed)
OPERATIVE REPORT  Date of Surgery: 09/26/2015  Surgeon: [name], MD  Assistant: Gerri Lins PA  Pre-op Diagnosis: Left Hand pain related to steal from Left Basilic vein transposition arteriovenous fistula  Post-op Diagnosis: Left Hand pain related to steal from Left Basilic vein transposition arteriovenous fistula  Procedure: Procedure(s): Revision Left Basilic Vein Transposition with Banding using Gortex graft  Anesthesia: Mac  EBL: Minimal  Complications: None  Procedure Details: The patient was taken to the operating room placed in supine position at which time the left upper extremity was prepped with Betadine scrub and solution draped in routine sterile manner. After infiltration with 1% Xylocaine with epinephrine the radial artery was evaluated with the Doppler at the wrist where there was damped flow. With compression of the upper arm fistula liver flow returned to normal. Patient had been experiencing pain and numbness in the left hand. Longitudinal incision was made over the arterial anastomosis at the antecubital area in the basilic vein to brachial artery anastomosis was exposed and the vein was exposed for a distance of about 4-5 cm. If complete compression of the fistula there was excellent radial arterial flow distally but with the fistula widely patent damped significantly. A piece of 6 mm Gore-Tex graft was obtained in about a 5 cm segment utilized it was opened longitudinally and then utilized as a patch to narrow the vein adjacent to the arterial anastomosis. We used several 6-0 Prolene vertical mattress sutures experimenting with how severely to narrow the fistula to continue to have adequate arterial flow distally and adequate flow in the fistula. By narrowing the vein approximately 30-40% we had fairly good arterial flow in the radial artery much improved and satisfactory flow in the fistula with a palpable pulse. This was assessed over 10-15 minute period and did  not seem to change. Wound was then closed in layers of Vicryl subcuticular fashion with Dermabond patient taken to recovery in satisfactory condition   [name], MD 09/26/2015 11:46 AM

## 2015-09-26 NOTE — Transfer of Care (Signed)
Immediate Anesthesia Transfer of Care Note  Patient: [name] [name]  Procedure(s) Performed: Procedure(s): Revision Left Basilic Vein Transposition with Banding using Gortex graft (Left)  Patient Location: PACU  Anesthesia Type:MAC  Level of Consciousness: awake and alert   Airway & Oxygen Therapy: Patient Spontanous Breathing and Patient connected to face mask oxygen  Post-op Assessment: Report given to RN, Post -op Vital signs reviewed and stable and Patient moving all extremities X 4  Post vital signs: Reviewed and stable  Last Vitals:  Filed Vitals:   09/26/15 0857 09/26/15 1152  BP: 127/56   Pulse: 75 73  Temp: 36.3 C 36.3 C  Resp: 18     Complications: No apparent anesthesia complications

## 2015-09-26 NOTE — Telephone Encounter (Signed)
-----   Message from Mena Goes, RN sent at 09/26/2015 11:54 AM EST ----- Regarding: schedule   ----- Message -----    From: Ulyses Amor, PA-C    Sent: 09/26/2015  11:43 AM      To: Vvs Charge Pool  F/U in 3 weeks with Dr.Lawson s/p fistula revision

## 2015-09-26 NOTE — Interval H&P Note (Signed)
History and Physical Interval Note:  09/26/2015 10:25 AM  [name] [name]  has presented today for surgery, with the diagnosis of End Stage Renal Disease N18.6  The various methods of treatment have been discussed with the patient and family. After consideration of risks, benefits and other options for treatment, the patient has consented to  Procedure(s): PLICATION VERSUS BANDING OF BRACHIOCEPHALIC ARTERIOVENOUS FISTULA (Left) as a surgical intervention .  The patient's history has been reviewed, patient examined, no change in status, stable for surgery.  I have reviewed the patient's chart and labs.  Questions were answered to the patient's satisfaction.     [name]

## 2015-09-26 NOTE — Anesthesia Procedure Notes (Signed)
Procedure Name: MAC Date/Time: 09/26/2015 11:00 AM Performed by: Julian Reil Pre-anesthesia Checklist: Patient identified, Emergency Drugs available, Suction available and Patient being monitored Patient Re-evaluated:Patient Re-evaluated prior to inductionOxygen Delivery Method: Simple face mask Intubation Type: IV induction Placement Confirmation: positive ETCO2 and breath sounds checked- equal and bilateral

## 2015-09-26 NOTE — H&P (View-Only) (Signed)
Vascular and Vein Specialist of [place]  Patient name: [name] [name] MRN: [id number] DOB: 09/11/1961 Sex: female  REASON FOR VISIT: left hand pain and numbness during dialysis  HPI: [name] [name] is a 54 y.o. female who underwent left second stage basilic vein transposition on 05/16/15. The fistula was originally created on 02/14/15. She started using her fistula for a few months now. She dialyzes on TTS. She reports that her fistula has been difficult to cannulate and the "vein collapses." Therefore she was sent to [facility] at the end of the October and again on 09/10/15  "to stretch her vein." Following the second procedure, she has reported increasing numbness and coldness to her left hand, especially at the fingertips. This is particularly severe with dialysis and she has required early termination of her treatment. She also reports a couple week history of weakness in left arm, specifically when trying to pick up heavy things. She says her "left arm gives out." She also complains of some chronic numbness of her left leg which has been present for a year. 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   She has a PMH of lupus, hypertension, diabetes and CVA.   Past Medical History  Diagnosis Date  . Hypertension   . Lupus ([place])   . CKD (chronic kidney disease)     due to lupus/Dr. [name]  . Diabetes mellitus ([place])   . Stenosis of cervical spine region     with HNP at C5/6, C6/7  . Metatarsal bone fracture right    4th  . 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 Arthritis     RA  . Anemia   . Stroke ([place]) 11/2014    left sided weakness, dysphagia  . Pain in joints   . Paresthesias   . Hyperlipidemia   . Colitis     ? per hospital notes 11/2014  . Lower GI bleed   . Hyponatremia   . Metabolic acidosis   . Hyperplastic colon polyp   . Hemorrhoids   . Blood transfusion without reported diagnosis   .  Myocardial infarction [facility])     Family History  Problem Relation Age of Onset  . Hypertension    . Lupus    . Rheum arthritis    . Hypertension Mother   . Diabetes Mother   . Hypertension Sister   . Diabetes Father   . Hypertension Maternal Grandmother     SOCIAL HISTORY: Social History  Substance Use Topics  . Smoking status: Former Smoker -- 0.50 packs/day for 30 years    Types: Cigarettes    Quit date: 09/22/2014  . Smokeless tobacco: Never Used  . Alcohol Use: No    Allergies  Allergen Reactions  . Sulfa Antibiotics Itching    Current Outpatient Prescriptions  Medication Sig Dispense Refill  . acetaminophen (TYLENOL) 325 MG tablet Take 2 tablets (650 mg total) by mouth every 4 (four) hours as needed for mild pain.    Marland Kitchen ALPRAZolam (XANAX) 0.25 MG tablet Take 1 tablet (0.25 mg total) by mouth every 8 (eight) hours as needed for anxiety (0.25mg -0.5mg ). 60 tablet 0  . atorvastatin (LIPITOR) 20 MG tablet Take 1 tablet (20 mg total) by mouth daily at 6 PM. 30 tablet 1  . cinacalcet (SENSIPAR) 30 MG tablet Take 30 mg by mouth daily.    Marland Kitchen docusate sodium (COLACE)  50 MG capsule Take 50 mg by mouth 2 (two) times daily.    . hydroxychloroquine (PLAQUENIL) 200 MG tablet Take 1 tablet (200 mg total) by mouth 2 (two) times daily. 60 tablet 1  . lidocaine-prilocaine (EMLA) cream Apply 1 application topically as needed.    Marland Kitchen LYRICA 75 MG capsule take 1 capsule by mouth once daily 30 capsule 3  . multivitamin (RENA-VIT) TABS tablet Take 1 tablet by mouth daily.    Marland Kitchen oxyCODONE-acetaminophen (PERCOCET/ROXICET) 5-325 MG per tablet Take 1 tablet by mouth every 4 (four) hours as needed for moderate pain. 30 tablet 0  . pantoprazole (PROTONIX) 40 MG tablet Take 1 tablet (40 mg total) by mouth daily. 30 tablet 11  . pravastatin (PRAVACHOL) 20 MG tablet Take 20 mg by mouth daily.    . sevelamer carbonate (RENVELA) 800 MG tablet Take 1 tablet (800 mg total) by mouth 3 (three) times daily with  meals. 90 tablet 1  . sorbitol 70 % solution Take 15 mLs by mouth daily as needed.    . temazepam (RESTORIL) 30 MG capsule Take 30 mg by mouth at bedtime as needed for sleep.    Marland Kitchen thiamine 100 MG tablet Take 1 tablet (100 mg total) by mouth daily. 30 tablet 1   No current facility-administered medications for this visit.    REVIEW OF SYSTEMS:  [X]  denotes positive finding, [ ]  denotes negative finding Cardiac  Comments:  Chest pain or chest pressure:    Shortness of breath upon exertion:    Short of breath when lying flat:    Irregular heart rhythm:        Vascular    Pain in calf, thigh, or hip brought on by ambulation:    Pain in feet at night that wakes you up from your sleep:     Blood clot in your veins:    Leg swelling:         Pulmonary    Oxygen at home:    Productive cough:     Wheezing:         Neurologic    Sudden weakness in arms or legs:     Sudden numbness in arms or legs:   Numbness for two weeks  Sudden onset of difficulty speaking or slurred speech:    Temporary loss of vision in one eye:     Problems with dizziness:         Gastrointestinal    Blood in stool:     Vomited blood:         Genitourinary    Burning when urinating:     Blood in urine:        Psychiatric    Major depression:         Hematologic    Bleeding problems:    Problems with blood clotting too easily:        Skin    Rashes or ulcers:        Constitutional    Fever or chills:      PHYSICAL EXAM: Filed Vitals:   09/21/15 1526  BP: 128/63  Pulse: 85  Temp: 98.3 F (36.8 C)  TempSrc: Oral  Resp: 16  Height: 5' 4.5" (1.638 m)  Weight: 126 lb (57.153 kg)  SpO2: 99%    GENERAL: The patient is a well-nourished female, in no acute distress. The vital signs are documented above. CARDIAC: There is a regular rate and rhythm. Harsh left carotid bruit. Left clavicular bruit. Soft right  carotid bruit.  VASCULAR: Non palpable left radial pulse. Palpable thrill left upper arm  fistula, strongest at anastomosis.  PULMONARY: There is good air exchange bilaterally without wheezing or rales. MUSCULOSKELETAL: There are no major deformities or cyanosis. NEUROLOGIC: Decreased sensation to left hand. Left grip strength 5/5.  SKIN: There are no ulcers or rashes noted. PSYCHIATRIC: The patient has a normal affect.  DATA:  Upper Extremity Arterial Duplex 09/21/15 LUE: Monophasic waveforms of left radial and ulnar arteries. Wrist brachial index 0.36. Left radial artery pressure increased from 24mm/Hg at rest to 98 mmHg with fistula compression. RUE: Triphasic brachial, radial and ulnar waveforms. Wrist brachial index 1.10  MEDICAL ISSUES:  Steal syndrome left brachial-cephalic fistula Numbness and coldness starting after two procedures, presumably venoplasty by [facility] two weeks ago. Patient is on HD TTS. Plan for plication versus banding of left brachial-cephalic AVF with Dr. [name] on 09/26/15. If this does not help to improve her symptoms, will need to ligate fistula.   Will also obtain carotid duplex at post-operative visit given bruits on exam.   Alvia Grove [facility] [place] Beeper: [phone]  Addendum  I have independently interviewed and examined the patient, and I agree with the physician assistant's findings.  Pt's sx are concerning for steal and her steal study is consistent with such.  In this young patient, I don't agree with DRIL as this patient is likely to have another 20-30 years at least to live.  There is no published series demonstrating this length of durability with DRIL.  Pt not ready to give up on the fistula, so would try to plicate the distal anastomosis to narrow it down vs banding the fistula.  The patient is aware the risk include but are not limited to bleeding, infection, thrombosis, and need for additional procedure including ligation of the fistula if the plication or banding fails.  Unfortunately, I will not  be available Wednesday, so the procedure is being scheduled with Dr. [name].    Adele Barthel, MD [facility] [place] Office: [phone] Pager: [phone]  09/21/2015, 4:44 PM

## 2015-09-26 NOTE — Anesthesia Preprocedure Evaluation (Addendum)
Anesthesia Evaluation  Patient identified by MRN, date of birth, ID band Patient awake    Reviewed: Allergy & Precautions, NPO status , Patient's Chart, lab work & pertinent test results  Airway Mallampati: III  TM Distance: >3 FB Neck ROM: Full    Dental  (+) Teeth Intact, Dental Advisory Given   Pulmonary former smoker,    breath sounds clear to auscultation       Cardiovascular hypertension, +CHF   Rhythm:Regular Rate:Normal     Neuro/Psych    GI/Hepatic   Endo/Other  diabetes, Type 2  Renal/GU CRFRenal disease     Musculoskeletal   Abdominal   Peds  Hematology   Anesthesia Other Findings   Reproductive/Obstetrics                          Anesthesia Physical Anesthesia Plan  ASA: IV  Anesthesia Plan: MAC   Post-op Pain Management:    Induction: Intravenous  Airway Management Planned: Simple Face Mask  Additional Equipment:   Intra-op Plan:   Post-operative Plan:   Informed Consent: I have reviewed the patients History and Physical, chart, labs and discussed the procedure including the risks, benefits and alternatives for the proposed anesthesia with the patient or authorized representative who has indicated his/her understanding and acceptance.   Dental advisory given  Plan Discussed with: CRNA, Anesthesiologist and Surgeon  Anesthesia Plan Comments:         Anesthesia Quick Evaluation

## 2015-10-01 ENCOUNTER — Encounter (HOSPITAL_COMMUNITY): Payer: Self-pay | Admitting: Vascular Surgery

## 2015-10-02 ENCOUNTER — Other Ambulatory Visit: Payer: Self-pay

## 2015-10-04 ENCOUNTER — Encounter (HOSPITAL_COMMUNITY): Payer: Self-pay | Admitting: *Deleted

## 2015-10-04 MED ORDER — DEXTROSE 5 % IV SOLN
1.5000 g | INTRAVENOUS | Status: AC
  Administered 2015-10-05: 1.5 g via INTRAVENOUS
  Filled 2015-10-04: qty 1.5

## 2015-10-04 MED ORDER — CHLORHEXIDINE GLUCONATE CLOTH 2 % EX PADS: 6.0000 | MEDICATED_PAD | Freq: Once | CUTANEOUS | Status: DC

## 2015-10-04 MED ORDER — SODIUM CHLORIDE 0.9 % IV SOLN
INTRAVENOUS | Status: DC
  Administered 2015-10-05: 07:00:00 via INTRAVENOUS

## 2015-10-04 NOTE — Anesthesia Preprocedure Evaluation (Addendum)
Anesthesia Evaluation  Patient identified by MRN, date of birth, ID band Patient awake  General Assessment Comment: [name] his a 54 y/o female with a history significant for SLE, CKD on HD, stroke, HTN, MI, and DM2 who presents today for INSERTION OF DIALYSIS CATHETER d/t ESRD by Dr. Nelda [name]. [name] [name].   Reviewed: Allergy & Precautions, NPO status , Patient's Chart, lab work & pertinent test results  History of Anesthesia Complications Negative for: history of anesthetic complications  Airway Mallampati: II  TM Distance: >3 FB Neck ROM: Full    Dental no notable dental hx.    Pulmonary shortness of breath and with exertion, pneumonia, resolved, former smoker,    Pulmonary exam normal breath sounds clear to auscultation       Cardiovascular hypertension, + Past MI and +CHF  Normal cardiovascular exam Rhythm:Regular Rate:Normal     Neuro/Psych Stroke with left sided weakness, dysphagia Hx of Lupus Parasthesias  Neuromuscular disease CVA, Residual Symptoms negative neurological ROS  negative psych ROS   GI/Hepatic negative GI ROS, Neg liver ROS,   Endo/Other  diabetes, Well Controlled, Type 2, Oral Hypoglycemic Agents  Renal/GU CRF and DialysisRenal disease  negative genitourinary   Musculoskeletal negative musculoskeletal ROS (+) Arthritis , Rheumatoid disorders,  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 The stress ECG was negative for ischemia. - Staged echo: There was no echocardiographic evidence for stress-induced  ischemia.  EKG 01/21/15: Sinus tachycardia Possible Left atrial enlargement Septal infarct , age undetermined T wave abnormality, consider inferolateral ischemia Abnormal ECG  BP Readings from Last 3 Encounters:  09/26/15 147/60  09/21/15 128/63  06/29/15 114/68   Lab Results  Component Value Date   WBC 6.5 06/29/2015   HGB 12.9 09/26/2015   HCT 38.0 09/26/2015   MCV 89.5 06/29/2015   PLT 239 06/29/2015     Chemistry      Component Value Date/Time   NA 138 09/26/2015 0855   K 3.5 09/26/2015 0855   CL 101 06/29/2015 1620   CO2 21 06/29/2015 1620   BUN 58* 06/29/2015 1620   CREATININE 9.61* 06/29/2015 1620   CREATININE 4.18* 02/22/2015 0726   CREATININE 0.35* 10/23/2008 2300      Component Value Date/Time   CALCIUM 9.2 06/29/2015 1620   CALCIUM 8.9 10/10/2014 1931   ALKPHOS 103 06/29/2015 1620   AST 17 06/29/2015 1620   ALT 9 06/29/2015 1620   BILITOT 0.4 06/29/2015 1620     Lab Results  Component Value Date   HGBA1C 4.7 06/29/2015     Anesthesia Physical  Anesthesia Plan  ASA: III  Anesthesia Plan: MAC   Post-op Pain Management:    Induction: Intravenous  Airway Management Planned: Simple Face Mask  Additional Equipment:   Intra-op Plan:   Post-operative Plan:   Informed Consent: I have reviewed the patients History and Physical, chart, labs and discussed the procedure including the risks, benefits and alternatives for the proposed anesthesia with the patient or authorized representative who has indicated his/her understanding and acceptance.  Dental advisory given  Plan Discussed with: CRNA and Surgeon  Anesthesia Plan Comments:         Anesthesia Quick Evaluation

## 2015-10-04 NOTE — Progress Notes (Signed)
Pt denies SOB, chest pain, and being under the care of a cardiologist. Pt denies having a cardiac cath. Pt made aware to stop otc vitamins, herbal medications, NSAID's and fish oil. Pt verbalized understanding of all pre-op instructions.

## 2015-10-05 ENCOUNTER — Ambulatory Visit (HOSPITAL_COMMUNITY): Payer: Managed Care, Other (non HMO) | Admitting: Certified Registered Nurse Anesthetist

## 2015-10-05 ENCOUNTER — Ambulatory Visit (HOSPITAL_COMMUNITY): Payer: Managed Care, Other (non HMO)

## 2015-10-05 ENCOUNTER — Encounter (HOSPITAL_COMMUNITY)
Admission: RE | Disposition: A | Discharge status: Home / Self Care | Payer: Self-pay | Source: Ambulatory Visit | Attending: Vascular Surgery

## 2015-10-05 ENCOUNTER — Encounter (HOSPITAL_COMMUNITY): Payer: Self-pay | Admitting: *Deleted

## 2015-10-05 ENCOUNTER — Ambulatory Visit (HOSPITAL_COMMUNITY)
Admission: RE | Admit: 2015-10-05 | Discharge: 2015-10-05 | Disposition: A | Discharge status: Home / Self Care | Payer: Managed Care, Other (non HMO) | Source: Ambulatory Visit | Attending: Vascular Surgery | Admitting: Vascular Surgery

## 2015-10-05 DIAGNOSIS — M1990 Unspecified osteoarthritis, unspecified site: Secondary | ICD-10-CM | POA: Insufficient documentation

## 2015-10-05 DIAGNOSIS — Z8673 Personal history of transient ischemic attack (TIA), and cerebral infarction without residual deficits: Secondary | ICD-10-CM | POA: Insufficient documentation

## 2015-10-05 DIAGNOSIS — E785 Hyperlipidemia, unspecified: Secondary | ICD-10-CM | POA: Insufficient documentation

## 2015-10-05 DIAGNOSIS — E1122 Type 2 diabetes mellitus with diabetic chronic kidney disease: Secondary | ICD-10-CM | POA: Diagnosis not present

## 2015-10-05 DIAGNOSIS — I252 Old myocardial infarction: Secondary | ICD-10-CM | POA: Insufficient documentation

## 2015-10-05 DIAGNOSIS — Z992 Dependence on renal dialysis: Secondary | ICD-10-CM | POA: Insufficient documentation

## 2015-10-05 DIAGNOSIS — Y832 Surgical operation with anastomosis, bypass or graft as the cause of abnormal reaction of the patient, or of later complication, without mention of misadventure at the time of the procedure: Secondary | ICD-10-CM | POA: Diagnosis not present

## 2015-10-05 DIAGNOSIS — M329 Systemic lupus erythematosus, unspecified: Secondary | ICD-10-CM | POA: Diagnosis not present

## 2015-10-05 DIAGNOSIS — Z87891 Personal history of nicotine dependence: Secondary | ICD-10-CM | POA: Diagnosis not present

## 2015-10-05 DIAGNOSIS — T82898A Other specified complication of vascular prosthetic devices, implants and grafts, initial encounter: Secondary | ICD-10-CM | POA: Diagnosis not present

## 2015-10-05 DIAGNOSIS — Z9889 Other specified postprocedural states: Secondary | ICD-10-CM

## 2015-10-05 DIAGNOSIS — I120 Hypertensive chronic kidney disease with stage 5 chronic kidney disease or end stage renal disease: Secondary | ICD-10-CM | POA: Insufficient documentation

## 2015-10-05 DIAGNOSIS — N186 End stage renal disease: Secondary | ICD-10-CM | POA: Diagnosis not present

## 2015-10-05 DIAGNOSIS — Z419 Encounter for procedure for purposes other than remedying health state, unspecified: Secondary | ICD-10-CM

## 2015-10-05 HISTORY — PX: INSERTION OF DIALYSIS CATHETER: SHX1324

## 2015-10-05 LAB — POCT I-STAT 4, (NA,K, GLUC, HGB,HCT)
Glucose, Bld: 88 mg/dL (ref 65–99)
HCT: 38 % (ref 36.0–46.0)
HEMOGLOBIN: 12.9 g/dL (ref 12.0–15.0)
Potassium: 4.5 mmol/L (ref 3.5–5.1)
SODIUM: 136 mmol/L (ref 135–145)

## 2015-10-05 LAB — GLUCOSE, CAPILLARY: Glucose-Capillary: 82 mg/dL (ref 65–99)

## 2015-10-05 SURGERY — INSERTION OF DIALYSIS CATHETER
Anesthesia: Monitor Anesthesia Care | Site: Neck

## 2015-10-05 MED ORDER — SODIUM CHLORIDE 0.9 % IV SOLN
INTRAVENOUS | Status: DC | PRN
  Administered 2015-10-05: 500 mL

## 2015-10-05 MED ORDER — HEPARIN SODIUM (PORCINE) 1000 UNIT/ML IJ SOLN
INTRAMUSCULAR | Status: AC
  Filled 2015-10-05: qty 1

## 2015-10-05 MED ORDER — FENTANYL CITRATE (PF) 250 MCG/5ML IJ SOLN
INTRAMUSCULAR | Status: AC
  Filled 2015-10-05: qty 5

## 2015-10-05 MED ORDER — MIDAZOLAM HCL 2 MG/2ML IJ SOLN
INTRAMUSCULAR | Status: AC
  Filled 2015-10-05: qty 2

## 2015-10-05 MED ORDER — ONDANSETRON HCL 4 MG/2ML IJ SOLN
INTRAMUSCULAR | Status: AC
  Filled 2015-10-05: qty 2

## 2015-10-05 MED ORDER — ONDANSETRON HCL 4 MG/2ML IJ SOLN
INTRAMUSCULAR | Status: DC | PRN
  Administered 2015-10-05: 4 mg via INTRAVENOUS

## 2015-10-05 MED ORDER — EPHEDRINE SULFATE 50 MG/ML IJ SOLN
INTRAMUSCULAR | Status: AC
  Filled 2015-10-05: qty 1

## 2015-10-05 MED ORDER — MIDAZOLAM HCL 5 MG/5ML IJ SOLN
INTRAMUSCULAR | Status: DC | PRN
  Administered 2015-10-05: 2 mg via INTRAVENOUS

## 2015-10-05 MED ORDER — FENTANYL CITRATE (PF) 100 MCG/2ML IJ SOLN
INTRAMUSCULAR | Status: DC | PRN
  Administered 2015-10-05: 50 ug via INTRAVENOUS

## 2015-10-05 MED ORDER — GLYCOPYRROLATE 0.2 MG/ML IJ SOLN
INTRAMUSCULAR | Status: AC
  Filled 2015-10-05: qty 1

## 2015-10-05 MED ORDER — SUCCINYLCHOLINE CHLORIDE 20 MG/ML IJ SOLN
INTRAMUSCULAR | Status: AC
  Filled 2015-10-05: qty 1

## 2015-10-05 MED ORDER — PROPOFOL 10 MG/ML IV BOLUS
INTRAVENOUS | Status: DC | PRN
  Administered 2015-10-05: 20 mg via INTRAVENOUS

## 2015-10-05 MED ORDER — FENTANYL CITRATE (PF) 100 MCG/2ML IJ SOLN
INTRAMUSCULAR | Status: AC
  Filled 2015-10-05: qty 2

## 2015-10-05 MED ORDER — LIDOCAINE HCL (CARDIAC) 20 MG/ML IV SOLN
INTRAVENOUS | Status: AC
  Filled 2015-10-05: qty 5

## 2015-10-05 MED ORDER — PHENYLEPHRINE 40 MCG/ML (10ML) SYRINGE FOR IV PUSH (FOR BLOOD PRESSURE SUPPORT)
PREFILLED_SYRINGE | INTRAVENOUS | Status: AC
  Filled 2015-10-05: qty 10

## 2015-10-05 MED ORDER — LIDOCAINE HCL (PF) 1 % IJ SOLN
INTRAMUSCULAR | Status: AC
  Filled 2015-10-05: qty 30

## 2015-10-05 MED ORDER — LIDOCAINE HCL (CARDIAC) 20 MG/ML IV SOLN
INTRAVENOUS | Status: DC | PRN
  Administered 2015-10-05: 60 mg via INTRAVENOUS

## 2015-10-05 MED ORDER — PROPOFOL 500 MG/50ML IV EMUL
INTRAVENOUS | Status: DC | PRN
  Administered 2015-10-05: 50 ug/kg/min via INTRAVENOUS

## 2015-10-05 MED ORDER — LIDOCAINE HCL (PF) 1 % IJ SOLN
INTRAMUSCULAR | Status: DC | PRN
  Administered 2015-10-05: 13 mL

## 2015-10-05 MED ORDER — PROMETHAZINE HCL 25 MG/ML IJ SOLN: 6.2500 mg | INTRAMUSCULAR | Status: DC | PRN

## 2015-10-05 MED ORDER — ROCURONIUM BROMIDE 50 MG/5ML IV SOLN
INTRAVENOUS | Status: AC
  Filled 2015-10-05: qty 1

## 2015-10-05 MED ORDER — HEPARIN SODIUM (PORCINE) 1000 UNIT/ML IJ SOLN
INTRAMUSCULAR | Status: DC | PRN
  Administered 2015-10-05: 3 mL via INTRAVENOUS

## 2015-10-05 MED ORDER — SODIUM CHLORIDE 0.9 % IJ SOLN
INTRAMUSCULAR | Status: AC
  Filled 2015-10-05: qty 10

## 2015-10-05 MED ORDER — FENTANYL CITRATE (PF) 100 MCG/2ML IJ SOLN
25.0000 ug | INTRAMUSCULAR | Status: DC | PRN
  Administered 2015-10-05 (×2): 25 ug via INTRAVENOUS

## 2015-10-05 SURGICAL SUPPLY — 46 items
BAG DECANTER FOR FLEXI CONT (MISCELLANEOUS) ×3 IMPLANT
BIOPATCH RED 1 DISK 7.0 (GAUZE/BANDAGES/DRESSINGS) ×2 IMPLANT
BIOPATCH RED 1IN DISK 7.0MM (GAUZE/BANDAGES/DRESSINGS) ×1
CATH CANNON HEMO 15F 50CM (CATHETERS) IMPLANT
CATH CANNON HEMO 15FR 19 (HEMODIALYSIS SUPPLIES) IMPLANT
CATH CANNON HEMO 15FR 23CM (HEMODIALYSIS SUPPLIES) IMPLANT
CATH CANNON HEMO 15FR 31CM (HEMODIALYSIS SUPPLIES) IMPLANT
CATH CANNON HEMO 15FR 32 (HEMODIALYSIS SUPPLIES) IMPLANT
CATH CANNON HEMO 15FR 32CM (HEMODIALYSIS SUPPLIES) IMPLANT
CATH PALINDROME REV 19CM (CATHETERS) ×2 IMPLANT
COVER PROBE W GEL 5X96 (DRAPES) IMPLANT
COVER SURGICAL LIGHT HANDLE (MISCELLANEOUS) ×3 IMPLANT
DECANTER SPIKE VIAL GLASS SM (MISCELLANEOUS) ×3 IMPLANT
DRAPE C-ARM 42X72 X-RAY (DRAPES) ×3 IMPLANT
DRAPE CHEST BREAST 15X10 FENES (DRAPES) ×3 IMPLANT
GAUZE SPONGE 2X2 8PLY NS (GAUZE/BANDAGES/DRESSINGS) ×2 IMPLANT
GAUZE SPONGE 2X2 8PLY STRL LF (GAUZE/BANDAGES/DRESSINGS) ×1 IMPLANT
GAUZE SPONGE 4X4 16PLY XRAY LF (GAUZE/BANDAGES/DRESSINGS) ×3 IMPLANT
GLOVE BIO SURGEON STRL SZ 6.5 (GLOVE) ×1 IMPLANT
GLOVE BIO SURGEONS STRL SZ 6.5 (GLOVE) ×1
GLOVE ECLIPSE 7.0 STRL STRAW (GLOVE) ×2 IMPLANT
GLOVE SS BIOGEL STRL SZ 7 (GLOVE) ×1 IMPLANT
GLOVE SUPERSENSE BIOGEL SZ 7 (GLOVE) ×2
GOWN STRL REUS W/ TWL LRG LVL3 (GOWN DISPOSABLE) ×2 IMPLANT
GOWN STRL REUS W/TWL LRG LVL3 (GOWN DISPOSABLE) ×6
KIT BASIN OR (CUSTOM PROCEDURE TRAY) ×3 IMPLANT
KIT ROOM TURNOVER OR (KITS) ×3 IMPLANT
LIQUID BAND (GAUZE/BANDAGES/DRESSINGS) ×2 IMPLANT
NDL 18GX1X1/2 (RX/OR ONLY) (NEEDLE) ×1 IMPLANT
NDL HYPO 25GX1X1/2 BEV (NEEDLE) ×1 IMPLANT
NEEDLE 18GX1X1/2 (RX/OR ONLY) (NEEDLE) ×3 IMPLANT
NEEDLE 22X1 1/2 (OR ONLY) (NEEDLE) ×3 IMPLANT
NEEDLE HYPO 25GX1X1/2 BEV (NEEDLE) ×3 IMPLANT
NS IRRIG 1000ML POUR BTL (IV SOLUTION) ×3 IMPLANT
PACK SURGICAL SETUP 50X90 (CUSTOM PROCEDURE TRAY) ×3 IMPLANT
PAD ARMBOARD 7.5X6 YLW CONV (MISCELLANEOUS) ×6 IMPLANT
SOAP 2 % CHG 4 OZ (WOUND CARE) ×3 IMPLANT
SPONGE GAUZE 2X2 STER 10/PKG (GAUZE/BANDAGES/DRESSINGS) ×2
SUT ETHILON 3 0 PS 1 (SUTURE) ×3 IMPLANT
SUT VICRYL 4-0 PS2 18IN ABS (SUTURE) ×3 IMPLANT
SYR 20CC LL (SYRINGE) ×3 IMPLANT
SYR 5ML LL (SYRINGE) ×6 IMPLANT
SYR CONTROL 10ML LL (SYRINGE) ×3 IMPLANT
SYRINGE 10CC LL (SYRINGE) ×3 IMPLANT
TAPE CLOTH SURG 4X10 WHT LF (GAUZE/BANDAGES/DRESSINGS) ×2 IMPLANT
WATER STERILE IRR 1000ML POUR (IV SOLUTION) ×3 IMPLANT

## 2015-10-05 NOTE — Transfer of Care (Signed)
Immediate Anesthesia Transfer of Care Note  Patient: [name] [name]  Procedure(s) Performed: Procedure(s): INSERTION OF DIALYSIS CATHETER Right Internal Jugular. (N/A)  Patient Location: PACU  Anesthesia Type:MAC  Level of Consciousness: awake, alert , oriented and patient cooperative  Airway & Oxygen Therapy: Patient Spontanous Breathing and Patient connected to face mask oxygen  Post-op Assessment: Report given to RN, Post -op Vital signs reviewed and stable and Patient moving all extremities X 4  Post vital signs: Reviewed and stable  Last Vitals:  Filed Vitals:   10/05/15 0609  BP: 144/79  Pulse: 89  Temp: 36.8 C  Resp: 16    Complications: No apparent anesthesia complications

## 2015-10-05 NOTE — H&P (View-Only) (Signed)
[facility] [place]  Patient name: [name] [name] MRN: [id number] DOB: 25-Nov-1960 Sex: female  REASON FOR VISIT: left hand pain and numbness during dialysis  HPI: [name] [name] is a 54 y.o. female who underwent left second stage basilic vein transposition on 05/16/15. The fistula was originally created on 02/14/15. She started using her fistula for a few months now. She dialyzes on TTS. She reports that her fistula has been difficult to cannulate and the "vein collapses." Therefore she was sent to [facility] at the end of the October and again on 09/10/15  "to stretch her vein." Following the second procedure, she has reported increasing numbness and coldness to her left hand, especially at the fingertips. This is particularly severe with dialysis and she has required early termination of her treatment. She also reports a couple week history of weakness in left arm, specifically when trying to pick up heavy things. She says her "left arm gives out." She also complains of some chronic numbness of her left leg which has been present for a year. 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   She has a PMH of lupus, hypertension, diabetes and CVA.   Past Medical History  Diagnosis Date  . Hypertension   . Lupus ([place])   . CKD (chronic kidney disease)     due to lupus/Dr. [name]  . Diabetes mellitus ([place])   . Stenosis of cervical spine region     with HNP at C5/6, C6/7  . Metatarsal bone fracture right    4th  . 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 Arthritis     RA  . Anemia   . Stroke ([place]) 11/2014    left sided weakness, dysphagia  . Pain in joints   . Paresthesias   . Hyperlipidemia   . Colitis     ? per hospital notes 11/2014  . Lower GI bleed   . Hyponatremia   . Metabolic acidosis   . Hyperplastic colon polyp   . Hemorrhoids   . Blood transfusion without reported diagnosis   .  Myocardial infarction [facility])     Family History  Problem Relation Age of Onset  . Hypertension    . Lupus    . Rheum arthritis    . Hypertension Mother   . Diabetes Mother   . Hypertension Sister   . Diabetes Father   . Hypertension Maternal Grandmother     SOCIAL HISTORY: Social History  Substance Use Topics  . Smoking status: Former Smoker -- 0.50 packs/day for 30 years    Types: Cigarettes    Quit date: 09/22/2014  . Smokeless tobacco: Never Used  . Alcohol Use: No    Allergies  Allergen Reactions  . Sulfa Antibiotics Itching    Current Outpatient Prescriptions  Medication Sig Dispense Refill  . acetaminophen (TYLENOL) 325 MG tablet Take 2 tablets (650 mg total) by mouth every 4 (four) hours as needed for mild pain.    Marland Kitchen ALPRAZolam (XANAX) 0.25 MG tablet Take 1 tablet (0.25 mg total) by mouth every 8 (eight) hours as needed for anxiety (0.25mg -0.5mg ). 60 tablet 0  . atorvastatin (LIPITOR) 20 MG tablet Take 1 tablet (20 mg total) by mouth daily at 6 PM. 30 tablet 1  . cinacalcet (SENSIPAR) 30 MG tablet Take 30 mg by mouth daily.    Marland Kitchen docusate sodium (COLACE)  50 MG capsule Take 50 mg by mouth 2 (two) times daily.    . hydroxychloroquine (PLAQUENIL) 200 MG tablet Take 1 tablet (200 mg total) by mouth 2 (two) times daily. 60 tablet 1  . lidocaine-prilocaine (EMLA) cream Apply 1 application topically as needed.    Marland Kitchen LYRICA 75 MG capsule take 1 capsule by mouth once daily 30 capsule 3  . multivitamin (RENA-VIT) TABS tablet Take 1 tablet by mouth daily.    Marland Kitchen oxyCODONE-acetaminophen (PERCOCET/ROXICET) 5-325 MG per tablet Take 1 tablet by mouth every 4 (four) hours as needed for moderate pain. 30 tablet 0  . pantoprazole (PROTONIX) 40 MG tablet Take 1 tablet (40 mg total) by mouth daily. 30 tablet 11  . pravastatin (PRAVACHOL) 20 MG tablet Take 20 mg by mouth daily.    . sevelamer carbonate (RENVELA) 800 MG tablet Take 1 tablet (800 mg total) by mouth 3 (three) times daily with  meals. 90 tablet 1  . sorbitol 70 % solution Take 15 mLs by mouth daily as needed.    . temazepam (RESTORIL) 30 MG capsule Take 30 mg by mouth at bedtime as needed for sleep.    Marland Kitchen thiamine 100 MG tablet Take 1 tablet (100 mg total) by mouth daily. 30 tablet 1   No current facility-administered medications for this visit.    REVIEW OF SYSTEMS:  [X]  denotes positive finding, [ ]  denotes negative finding Cardiac  Comments:  Chest pain or chest pressure:    Shortness of breath upon exertion:    Short of breath when lying flat:    Irregular heart rhythm:        Vascular    Pain in calf, thigh, or hip brought on by ambulation:    Pain in feet at night that wakes you up from your sleep:     Blood clot in your veins:    Leg swelling:         Pulmonary    Oxygen at home:    Productive cough:     Wheezing:         Neurologic    Sudden weakness in arms or legs:     Sudden numbness in arms or legs:   Numbness for two weeks  Sudden onset of difficulty speaking or slurred speech:    Temporary loss of vision in one eye:     Problems with dizziness:         Gastrointestinal    Blood in stool:     Vomited blood:         Genitourinary    Burning when urinating:     Blood in urine:        Psychiatric    Major depression:         Hematologic    Bleeding problems:    Problems with blood clotting too easily:        Skin    Rashes or ulcers:        Constitutional    Fever or chills:      PHYSICAL EXAM: Filed Vitals:   09/21/15 1526  BP: 128/63  Pulse: 85  Temp: 98.3 F (36.8 C)  TempSrc: Oral  Resp: 16  Height: 5' 4.5" (1.638 m)  Weight: 126 lb (57.153 kg)  SpO2: 99%    GENERAL: The patient is a well-nourished female, in no acute distress. The vital signs are documented above. CARDIAC: There is a regular rate and rhythm. Harsh left carotid bruit. Left clavicular bruit. Soft right  carotid bruit.  VASCULAR: Non palpable left radial pulse. Palpable thrill left upper arm  fistula, strongest at anastomosis.  PULMONARY: There is good air exchange bilaterally without wheezing or rales. MUSCULOSKELETAL: There are no major deformities or cyanosis. NEUROLOGIC: Decreased sensation to left hand. Left grip strength 5/5.  SKIN: There are no ulcers or rashes noted. PSYCHIATRIC: The patient has a normal affect.  DATA:  Upper Extremity Arterial Duplex 09/21/15 LUE: Monophasic waveforms of left radial and ulnar arteries. Wrist brachial index 0.36. Left radial artery pressure increased from 78mm/Hg at rest to 98 mmHg with fistula compression. RUE: Triphasic brachial, radial and ulnar waveforms. Wrist brachial index 1.10  MEDICAL ISSUES:  Steal syndrome left brachial-cephalic fistula Numbness and coldness starting after two procedures, presumably venoplasty by [facility] two weeks ago. Patient is on HD TTS. Plan for plication versus banding of left brachial-cephalic AVF with Dr. [name] on 09/26/15. If this does not help to improve her symptoms, will need to ligate fistula.   Will also obtain carotid duplex at post-operative visit given bruits on exam.   Alvia Grove [facility] [place] Beeper: [phone]  Addendum  I have independently interviewed and examined the patient, and I agree with the physician assistant's findings.  Pt's sx are concerning for steal and her steal study is consistent with such.  In this young patient, I don't agree with DRIL as this patient is likely to have another 20-30 years at least to live.  There is no published series demonstrating this length of durability with DRIL.  Pt not ready to give up on the fistula, so would try to plicate the distal anastomosis to narrow it down vs banding the fistula.  The patient is aware the risk include but are not limited to bleeding, infection, thrombosis, and need for additional procedure including ligation of the fistula if the plication or banding fails.  Unfortunately, I will not  be available Wednesday, so the procedure is being scheduled with Dr. [name].    Adele Barthel, MD [facility] [employer] Office: [phone] Pager: [phone]  09/21/2015, 4:44 PM

## 2015-10-05 NOTE — Anesthesia Procedure Notes (Signed)
Procedure Name: MAC Date/Time: 10/05/2015 7:30 AM Performed by: Layla Maw Pre-anesthesia Checklist: Patient identified, Timeout performed, Emergency Drugs available, Suction available and Patient being monitored Patient Re-evaluated:Patient Re-evaluated prior to inductionOxygen Delivery Method: Simple face mask Intubation Type: IV induction Number of attempts: 1

## 2015-10-05 NOTE — Interval H&P Note (Signed)
History and Physical Interval Note:  10/05/2015 7:22 AM  [name] [name]  has presented today for surgery, with the diagnosis of End Stage Renal Disease N18.6  The various methods of treatment have been discussed with the patient and family. After consideration of risks, benefits and other options for treatment, the patient has consented to  Procedure(s): INSERTION OF DIALYSIS CATHETER (N/A) as a surgical intervention .  The patient's history has been reviewed, patient examined, no change in status, stable for surgery.  I have reviewed the patient's chart and labs.  Questions were answered to the patient's satisfaction.     [name]

## 2015-10-05 NOTE — Anesthesia Postprocedure Evaluation (Signed)
Anesthesia Post Note  Patient: [name] [name]  Procedure(s) Performed: Procedure(s) (LRB): INSERTION OF DIALYSIS CATHETER Right Internal Jugular. (N/A)  Patient location during evaluation: PACU Anesthesia Type: MAC Level of consciousness: awake and alert Pain management: pain level controlled Vital Signs Assessment: post-procedure vital signs reviewed and stable Respiratory status: spontaneous breathing, nonlabored ventilation, respiratory function stable and patient connected to nasal cannula oxygen Cardiovascular status: blood pressure returned to baseline and stable Postop Assessment: no signs of nausea or vomiting Anesthetic complications: no    Last Vitals:  Filed Vitals:   10/05/15 0900 10/05/15 0915  BP: 122/63 138/66  Pulse: 84 83  Temp:    Resp: 12 14    Last Pain:  Filed Vitals:   10/05/15 0917  PainSc: 5                  Tiphani Mells S

## 2015-10-05 NOTE — Op Note (Signed)
OPERATIVE REPORT  Date of Surgery: 10/05/2015  Surgeon: [name], MD  Assistant: Nurse  Pre-op Diagnosis: End Stage Renal Disease N18.6  Post-op Diagnosis: end Stage Renal Disease.  Procedure: Procedure(s): INSERTION OF DIALYSIS CATHETER Right Internal Jugular.-19 cm palindrome Bilateral ultrasound localization internal jugular veins Anesthesia: Mac  EBL: Minimal  Complications: None  Procedure Details: The patient was taken the operating room placed in the Trendelenburg position upper chest and neck exposed in both internal jugular veins were imaged using B-mode ultrasound-sono site both noted to be widely patent. After prepping and draping in routine sterile manner in the right IJ was entered using a supraclavicular approach guidewire passed into the right atrium under fluoroscopic guidance. After dilating the tract appropriately and 19 cm palindrome catheter was passed or a peel-away sheath position and the right atrium tunneled peripherally and secured with nylon suture. Wound closed with Vicryl subcutaneous fashion sterile dressing applied patient taken recovery room for chest x-ray   [name], MD 10/05/2015 8:00 AM

## 2015-10-07 ENCOUNTER — Emergency Department (HOSPITAL_COMMUNITY): Payer: Managed Care, Other (non HMO)

## 2015-10-07 ENCOUNTER — Emergency Department (HOSPITAL_COMMUNITY)
Admission: EM | Admit: 2015-10-07 | Discharge: 2015-10-07 | Disposition: A | Discharge status: Home / Self Care | Payer: Managed Care, Other (non HMO) | Attending: Emergency Medicine | Admitting: Emergency Medicine

## 2015-10-07 ENCOUNTER — Encounter (HOSPITAL_COMMUNITY): Payer: Self-pay | Admitting: *Deleted

## 2015-10-07 DIAGNOSIS — Z79899 Other long term (current) drug therapy: Secondary | ICD-10-CM | POA: Insufficient documentation

## 2015-10-07 DIAGNOSIS — I252 Old myocardial infarction: Secondary | ICD-10-CM | POA: Insufficient documentation

## 2015-10-07 DIAGNOSIS — E785 Hyperlipidemia, unspecified: Secondary | ICD-10-CM | POA: Diagnosis not present

## 2015-10-07 DIAGNOSIS — Z8781 Personal history of (healed) traumatic fracture: Secondary | ICD-10-CM | POA: Insufficient documentation

## 2015-10-07 DIAGNOSIS — Z8739 Personal history of other diseases of the musculoskeletal system and connective tissue: Secondary | ICD-10-CM | POA: Diagnosis not present

## 2015-10-07 DIAGNOSIS — Z86010 Personal history of colonic polyps: Secondary | ICD-10-CM | POA: Diagnosis not present

## 2015-10-07 DIAGNOSIS — T82838A Hemorrhage of vascular prosthetic devices, implants and grafts, initial encounter: Secondary | ICD-10-CM | POA: Insufficient documentation

## 2015-10-07 DIAGNOSIS — Z862 Personal history of diseases of the blood and blood-forming organs and certain disorders involving the immune mechanism: Secondary | ICD-10-CM | POA: Insufficient documentation

## 2015-10-07 DIAGNOSIS — Z8719 Personal history of other diseases of the digestive system: Secondary | ICD-10-CM | POA: Diagnosis not present

## 2015-10-07 DIAGNOSIS — Y802 Prosthetic and other implants, materials and accessory physical medicine devices associated with adverse incidents: Secondary | ICD-10-CM | POA: Diagnosis not present

## 2015-10-07 DIAGNOSIS — N186 End stage renal disease: Secondary | ICD-10-CM | POA: Diagnosis not present

## 2015-10-07 DIAGNOSIS — Z8673 Personal history of transient ischemic attack (TIA), and cerebral infarction without residual deficits: Secondary | ICD-10-CM | POA: Insufficient documentation

## 2015-10-07 DIAGNOSIS — I120 Hypertensive chronic kidney disease with stage 5 chronic kidney disease or end stage renal disease: Secondary | ICD-10-CM | POA: Diagnosis not present

## 2015-10-07 DIAGNOSIS — M069 Rheumatoid arthritis, unspecified: Secondary | ICD-10-CM | POA: Insufficient documentation

## 2015-10-07 DIAGNOSIS — Z992 Dependence on renal dialysis: Secondary | ICD-10-CM | POA: Insufficient documentation

## 2015-10-07 DIAGNOSIS — Z87891 Personal history of nicotine dependence: Secondary | ICD-10-CM | POA: Diagnosis not present

## 2015-10-07 DIAGNOSIS — R58 Hemorrhage, not elsewhere classified: Secondary | ICD-10-CM

## 2015-10-07 DIAGNOSIS — G8929 Other chronic pain: Secondary | ICD-10-CM | POA: Insufficient documentation

## 2015-10-07 NOTE — ED Notes (Signed)
No evidence of bleeding from site at this time.  New dressing remains in place and intact.

## 2015-10-07 NOTE — ED Notes (Signed)
MD at bedside. 

## 2015-10-07 NOTE — ED Provider Notes (Signed)
CSN: [id number]     Arrival date & time 10/07/15  0545 History   First MD Initiated Contact with Patient 10/07/15 0601     Chief Complaint  Patient presents with  . Vascular Access Problem     HPI Patient presents with complaints of bleeding from a recently placed right tunneled internal jugular dialysis catheter placed by Dr. [name] of vascular surgery.  This was placed 48 hours ago in the operating room.  She complains a small amount of bleeding that developed in the mill the night tonight while she was asleep.  The blood appears to be coagulated around the catheter where it exits the chest wall.  She has no complaints of chest pain or shortness of breath at this time.  She has not contacted the vascular surgery office.  She felt something wet on her chest and noted the blood and thus came to the ER for evaluation.      Past Medical History  Diagnosis Date  . Hypertension   . Lupus ([place])   . CKD (chronic kidney disease)     due to lupus/Dr. [name]  . Stenosis of cervical spine region     with HNP at C5/6, C6/7  . Metatarsal bone fracture right    4th  . 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 Arthritis     RA  . Anemia   . Stroke ([place]) 11/2014    left sided weakness, dysphagia  . Pain in joints   . Paresthesias   . Hyperlipidemia   . Colitis     ? per hospital notes 11/2014  . Lower GI bleed   . Hyponatremia   . Metabolic acidosis   . Hyperplastic colon polyp   . Hemorrhoids   . Blood transfusion without reported diagnosis   . Myocardial infarction ([place])   . Diabetes mellitus ([place])     border line   Past Surgical History  Procedure Laterality Date  . Breast biopsy    . Tubal ligation    . Insertion of dialysis catheter Right 02/05/2015    Procedure: INSERTION OF DIALYSIS CATHETER - RIGHT INTERNAL JUGULAR;  Surgeon: Conrad Worthville, MD;  Location: [facility];  Service: Vascular;  Laterality: Right;  . Bascilic vein transposition  Left 02/14/2015    Procedure: BASILIC VEIN Fistula Creation First Stage;  Surgeon: Rosetta Posner, MD;  Location: [facility];  Service: Vascular;  Laterality: Left;  . Colonoscopy w/ biopsies and polypectomy    . Bascilic vein transposition Left 03/28/2015    Procedure: LEFT ARM SECOND STAGE BASCILIC VEIN TRANSPOSITION;  Surgeon: Rosetta Posner, MD;  Location: [facility];  Service: Vascular;  Laterality: Left;  . Revison of arteriovenous fistula Left 09/26/2015    Procedure: Revision Left Basilic Vein Transposition with Banding using Gortex graft;  Surgeon: Mal Misty, MD;  Location: [facility] OR;  Service: Vascular;  Laterality: Left;   Family History  Problem Relation Age of Onset  . Hypertension    . Lupus    . Rheum arthritis    . Hypertension Mother   . Diabetes Mother   . Hypertension Sister   . Diabetes Father   . Hypertension Maternal Grandmother    Social History  Substance Use Topics  . Smoking status: Former Smoker -- 0.50 packs/day for 30 years    Types: Cigarettes    Quit date: 09/22/2014  . Smokeless tobacco: Never  Used  . Alcohol Use: No   OB History    No data available     Review of Systems  All other systems reviewed and are negative.     Allergies  Sulfa antibiotics  Home Medications   Prior to Admission medications   Medication Sig Start Date End Date Taking? Authorizing Provider  acetaminophen (TYLENOL) 325 MG tablet Take 2 tablets (650 mg total) by mouth every 4 (four) hours as needed for mild pain. 10/25/14   Bary Leriche, PA-C  ALPRAZolam Duanne Moron) 0.25 MG tablet Take 1 tablet (0.25 mg total) by mouth every 8 (eight) hours as needed for anxiety (0.25mg -0.5mg ). 02/22/15   Lavon Paganini Angiulli, PA-C  cinacalcet (SENSIPAR) 30 MG tablet Take 30 mg by mouth daily.    Historical Provider, MD  docusate sodium (COLACE) 50 MG capsule Take 50 mg by mouth 2 (two) times daily.    Historical Provider, MD  HYDROcodone-acetaminophen (NORCO) 7.5-325 MG tablet Take 1 tablet by mouth  every 4 (four) hours as needed for severe pain.  09/03/15   Historical Provider, MD  hydroxychloroquine (PLAQUENIL) 200 MG tablet Take 1 tablet (200 mg total) by mouth 2 (two) times daily. 07/02/15   [name], MD  lidocaine-prilocaine (EMLA) cream Apply 1 application topically as needed.    Historical Provider, MD  LYRICA 75 MG capsule take 1 capsule by mouth once daily 07/18/15   Darreld Mclean, MD  multivitamin (RENA-VIT) TABS tablet Take 1 tablet by mouth daily.    Historical Provider, MD  oxyCODONE-acetaminophen (PERCOCET/ROXICET) 5-325 MG tablet Take 1 tablet by mouth every 6 (six) hours as needed. 09/26/15   Ulyses Amor, PA-C  pantoprazole (PROTONIX) 40 MG tablet Take 1 tablet (40 mg total) by mouth daily. 02/22/15   Lavon Paganini Angiulli, PA-C  pravastatin (PRAVACHOL) 20 MG tablet Take 20 mg by mouth daily.    Historical Provider, MD  RENVELA 2.4 G PACK Take 2.4-4.8 g by mouth 3 (three) times daily with meals. 2 PACKS WITH MEALS 3 TIMES DAILY, 1 PACK WITH SNACKS 08/07/15   Historical Provider, MD  temazepam (RESTORIL) 30 MG capsule Take 30 mg by mouth at bedtime as needed for sleep.    Historical Provider, MD  thiamine 100 MG tablet Take 1 tablet (100 mg total) by mouth daily. 02/22/15   Lavon Paganini Angiulli, PA-C   BP 128/80 mmHg  Pulse 98  Temp(Src) 98.6 F (37 C) (Oral)  Resp 20  Ht 5' 5.5" (1.664 m)  Wt 130 lb (58.968 kg)  BMI 21.30 kg/m2  SpO2 100%  LMP 07/18/2012 Physical Exam  Constitutional: She is oriented to person, place, and time. She appears well-developed and well-nourished.  HENT:  Head: Normocephalic.  Eyes: EOM are normal.  Neck: Normal range of motion.  Pulmonary/Chest: Effort normal.  Tunnel catheter present in the right anterior chest wall.  Exits in the anterior wall.  A small amount of clotted blood is noted around the catheter at this point.  There is a very small amount of active bleeding.  The stitch at the skin surface is intact.  There is no proximal  hematoma or swelling.  Abdominal: She exhibits no distension.  Musculoskeletal: Normal range of motion.  Neurological: She is alert and oriented to person, place, and time.  Psychiatric: She has a normal mood and affect.  Nursing note and vitals reviewed.   ED Course  Procedures (including critical care time) Labs Review Labs Reviewed - No data to display  CLINICAL DATA: Bleeding from new right IJ tunneled catheter for 1 day. EXAM: CHEST 2 VIEW COMPARISON: Chest x-ray dated 10/05/2015. FINDINGS: The right IJ dialysis catheter appears stable in position with tip adequately positioned in the SVC. Heart size is upper normal, stable. Overall cardiomediastinal silhouette is stable in size and configuration. Perhaps minimal atelectasis at the right lung base, unchanged. Left lung remains clear. No pleural effusion. No pneumothorax. Mild degenerative change again noted within the thoracic spine. Osseous structures otherwise unremarkable.  IMPRESSION: Stable chest x-ray. No acute findings. IJ dialysis catheter is stable in position.    I have personally reviewed and evaluated these images and lab results as part of my medical decision-making.   EKG Interpretation None      MDM   Final diagnoses:  Bleeding    No significant bleeding on my valuation.  I discussed the case with Dr. Oneida Alar of vascular surgery recommends placement of a small piece of Gelfoam does under the catheter at the skin site to help with bleeding coagulation.  This was performed as well as a small pressure dressing placed over the exit site at the chest wall.  She was ambulated around the emergency department.  She appears to have no more bleeding.  I've asked that if she were to develop more bleeding to contact the vascular surgery office and request to speak with Dr. fields of the on-call provider at that time.  I've asked that she call the vascular surgery office on Monday morning for close repeat  follow-up.  I sent a message to Dr. [name] through the electronic medical record to inform him of her recent visit to the emergency department such that he can follow the patient closely in this postoperative period given her new complaint.    Jola Schmidt, MD 10/08/15 [phone]

## 2015-10-07 NOTE — ED Notes (Signed)
Pt. Had a dialysis catheter put in her right subclavian on Friday and was dialyzed with the catheter on Saturday. Pt. Has bright red blood coming from catheter site. Bleeding controlled at this time

## 2015-10-08 ENCOUNTER — Encounter (HOSPITAL_COMMUNITY): Payer: Self-pay | Admitting: Vascular Surgery

## 2015-10-09 ENCOUNTER — Other Ambulatory Visit: Payer: Self-pay | Admitting: Vascular Surgery

## 2015-10-09 ENCOUNTER — Other Ambulatory Visit: Payer: Self-pay

## 2015-10-09 ENCOUNTER — Ambulatory Visit (INDEPENDENT_AMBULATORY_CARE_PROVIDER_SITE_OTHER): Payer: Self-pay | Admitting: Family

## 2015-10-09 ENCOUNTER — Encounter: Payer: Self-pay | Admitting: Family

## 2015-10-09 ENCOUNTER — Ambulatory Visit (HOSPITAL_COMMUNITY)
Admission: RE | Admit: 2015-10-09 | Discharge: 2015-10-09 | Disposition: A | Discharge status: Home / Self Care | Payer: Managed Care, Other (non HMO) | Source: Ambulatory Visit | Attending: Vascular Surgery | Admitting: Vascular Surgery

## 2015-10-09 DIAGNOSIS — R0989 Other specified symptoms and signs involving the circulatory and respiratory systems: Secondary | ICD-10-CM | POA: Diagnosis not present

## 2015-10-09 DIAGNOSIS — I129 Hypertensive chronic kidney disease with stage 1 through stage 4 chronic kidney disease, or unspecified chronic kidney disease: Secondary | ICD-10-CM | POA: Diagnosis not present

## 2015-10-09 DIAGNOSIS — E119 Type 2 diabetes mellitus without complications: Secondary | ICD-10-CM | POA: Diagnosis not present

## 2015-10-09 DIAGNOSIS — T8249XA Other complication of vascular dialysis catheter, initial encounter: Secondary | ICD-10-CM

## 2015-10-09 DIAGNOSIS — T82898A Other specified complication of vascular prosthetic devices, implants and grafts, initial encounter: Secondary | ICD-10-CM | POA: Diagnosis not present

## 2015-10-09 DIAGNOSIS — N189 Chronic kidney disease, unspecified: Secondary | ICD-10-CM | POA: Insufficient documentation

## 2015-10-09 DIAGNOSIS — I6523 Occlusion and stenosis of bilateral carotid arteries: Secondary | ICD-10-CM | POA: Diagnosis not present

## 2015-10-09 DIAGNOSIS — N186 End stage renal disease: Secondary | ICD-10-CM

## 2015-10-09 DIAGNOSIS — E785 Hyperlipidemia, unspecified: Secondary | ICD-10-CM | POA: Diagnosis not present

## 2015-10-09 DIAGNOSIS — Z992 Dependence on renal dialysis: Secondary | ICD-10-CM

## 2015-10-09 DIAGNOSIS — Y832 Surgical operation with anastomosis, bypass or graft as the cause of abnormal reaction of the patient, or of later complication, without mention of misadventure at the time of the procedure: Secondary | ICD-10-CM | POA: Diagnosis not present

## 2015-10-09 NOTE — Progress Notes (Signed)
    Postoperative Access Visit   History of Present Illness  [name] [name] is a 54 y.o. year old female who had a carotid artery duplex today, was not scheduled to see a provider, but asked to have tugging at right IJ HD catheter evaluated. Right IJ HD catheter was placed by Dr. [name] on 10/05/15 while left upper arm AVF is being revised. She dialyzes TTS. The right IJ HD catheter is secured by 2 sutures and these are tugging the skin upward. She obtains relief by wearing her bra day and night which pushes up her right breast thereby decreasing the tugging. The right IJ sight shows no signs of infection; a 2x2 gauze dressing covers the insertion site. The pt states she has had tingling in her left hand since her stroke. Her left radial and ulnar pulses are not palpable, left upper arm AVF with palpable thrill. Both hands are warm. Bilateral hand grip is 5/5. The patient is able to complete their activities of daily living.    For VQI Use Only  PRE-ADM LIVING: Home  AMB STATUS: Ambulatory  Physical Examination There were no vitals filed for this visit. There is no weight on file to calculate BMI.   Medical Decision Making  [name] [name] is a 54 y.o. year old female who presents s/p right IJ HD catheter placed on 10/05/15 while left upper arm AVF is being revised. She dialyzes TTS. The right IJ HD catheter is secured by 2 sutures and these are tugging the skin upward. She obtains relief by wearing her bra day and night which pushes up her right breast thereby decreasing the tugging. The pt states the tugging is mostly relieved by wearing her bra. She has a follow up appointment with Dr. [name] on 10/16/15 and will discuss with him whether the sutures need to be clipped and resutured.   Tonie Elsey, Sharmon Leyden, RN, MSN, FNP-C [facility] Office: [phone]  10/09/2015, 11:25 AM  Clinic MD: Early

## 2015-10-10 ENCOUNTER — Encounter (HOSPITAL_COMMUNITY)
Admission: RE | Disposition: A | Discharge status: Home / Self Care | Payer: Self-pay | Source: Ambulatory Visit | Attending: Surgery

## 2015-10-10 ENCOUNTER — Encounter (HOSPITAL_COMMUNITY): Payer: Self-pay | Admitting: Surgery

## 2015-10-10 ENCOUNTER — Encounter: Payer: Self-pay | Admitting: Vascular Surgery

## 2015-10-10 ENCOUNTER — Ambulatory Visit (HOSPITAL_COMMUNITY)
Admission: RE | Admit: 2015-10-10 | Discharge: 2015-10-10 | Disposition: A | Discharge status: Home / Self Care | Payer: Managed Care, Other (non HMO) | Source: Ambulatory Visit | Attending: Surgery | Admitting: Surgery

## 2015-10-10 DIAGNOSIS — T82898A Other specified complication of vascular prosthetic devices, implants and grafts, initial encounter: Secondary | ICD-10-CM | POA: Diagnosis not present

## 2015-10-10 DIAGNOSIS — N186 End stage renal disease: Secondary | ICD-10-CM | POA: Insufficient documentation

## 2015-10-10 DIAGNOSIS — I871 Compression of vein: Secondary | ICD-10-CM | POA: Diagnosis not present

## 2015-10-10 DIAGNOSIS — Z992 Dependence on renal dialysis: Secondary | ICD-10-CM | POA: Insufficient documentation

## 2015-10-10 HISTORY — PX: PERIPHERAL VASCULAR CATHETERIZATION: SHX172C

## 2015-10-10 LAB — POCT I-STAT, CHEM 8
BUN: 26 mg/dL — AB (ref 6–20)
CHLORIDE: 97 mmol/L — AB (ref 101–111)
CREATININE: 5.7 mg/dL — AB (ref 0.44–1.00)
Calcium, Ion: 0.99 mmol/L — ABNORMAL LOW (ref 1.12–1.23)
GLUCOSE: 78 mg/dL (ref 65–99)
HCT: 35 % — ABNORMAL LOW (ref 36.0–46.0)
Hemoglobin: 11.9 g/dL — ABNORMAL LOW (ref 12.0–15.0)
POTASSIUM: 5 mmol/L (ref 3.5–5.1)
Sodium: 138 mmol/L (ref 135–145)
TCO2: 30 mmol/L (ref 0–100)

## 2015-10-10 LAB — GLUCOSE, CAPILLARY: GLUCOSE-CAPILLARY: 80 mg/dL (ref 65–99)

## 2015-10-10 SURGERY — A/V SHUNTOGRAM/FISTULAGRAM
Anesthesia: LOCAL

## 2015-10-10 MED ORDER — ACETAMINOPHEN 325 MG PO TABS: 325.0000 mg | ORAL_TABLET | ORAL | Status: DC | PRN

## 2015-10-10 MED ORDER — HYDRALAZINE HCL 20 MG/ML IJ SOLN: 5.0000 mg | INTRAMUSCULAR | Status: DC | PRN

## 2015-10-10 MED ORDER — FENTANYL CITRATE (PF) 100 MCG/2ML IJ SOLN
INTRAMUSCULAR | Status: AC
  Filled 2015-10-10: qty 2

## 2015-10-10 MED ORDER — LIDOCAINE HCL (PF) 1 % IJ SOLN
INTRAMUSCULAR | Status: AC
  Filled 2015-10-10: qty 30

## 2015-10-10 MED ORDER — DOCUSATE SODIUM 100 MG PO CAPS: 100.0000 mg | ORAL_CAPSULE | Freq: Every day | ORAL | Status: DC

## 2015-10-10 MED ORDER — FENTANYL CITRATE (PF) 100 MCG/2ML IJ SOLN
INTRAMUSCULAR | Status: DC | PRN
  Administered 2015-10-10: 25 ug via INTRAVENOUS

## 2015-10-10 MED ORDER — ACETAMINOPHEN 325 MG RE SUPP: 325.0000 mg | RECTAL | Status: DC | PRN

## 2015-10-10 MED ORDER — SODIUM CHLORIDE 0.9 % IJ SOLN: 3.0000 mL | INTRAMUSCULAR | Status: DC | PRN

## 2015-10-10 MED ORDER — HEPARIN (PORCINE) IN NACL 2-0.9 UNIT/ML-% IJ SOLN
INTRAMUSCULAR | Status: AC
  Filled 2015-10-10: qty 500

## 2015-10-10 MED ORDER — LABETALOL HCL 5 MG/ML IV SOLN: 10.0000 mg | INTRAVENOUS | Status: DC | PRN

## 2015-10-10 MED ORDER — LIDOCAINE HCL (PF) 1 % IJ SOLN
INTRAMUSCULAR | Status: DC | PRN
  Administered 2015-10-10: 09:00:00

## 2015-10-10 MED ORDER — OXYCODONE HCL 5 MG PO TABS: 5.0000 mg | ORAL_TABLET | ORAL | Status: DC | PRN

## 2015-10-10 MED ORDER — IODIXANOL 320 MG/ML IV SOLN
INTRAVENOUS | Status: DC | PRN
Start: 2015-10-10 — End: 2015-10-10
  Administered 2015-10-10: 60 mL via INTRAVENOUS

## 2015-10-10 SURGICAL SUPPLY — 18 items
BAG SNAP BAND KOVER 36X36 (MISCELLANEOUS) ×2 IMPLANT
BALLN MUSTANG 6X60X75 (BALLOONS) ×2
BALLN STERLING OTW 8X60X135 (BALLOONS) ×2
BALLOON MUSTANG 6X60X75 (BALLOONS) IMPLANT
BALLOON STERLING OTW 8X60X135 (BALLOONS) IMPLANT
COVER DOME SNAP 22 D (MISCELLANEOUS) ×2 IMPLANT
COVER PRB 48X5XTLSCP FOLD TPE (BAG) ×1 IMPLANT
COVER PROBE 5X48 (BAG) ×2
KIT ENCORE 26 ADVANTAGE (KITS) ×1 IMPLANT
KIT MICROINTRODUCER STIFF 5F (SHEATH) ×2 IMPLANT
PACK CARDIAC CATHETERIZATION (CUSTOM PROCEDURE TRAY) ×1 IMPLANT
PROTECTION STATION PRESSURIZED (MISCELLANEOUS) ×2
SHEATH PINNACLE R/O II 5F 6CM (SHEATH) ×1 IMPLANT
STATION PROTECTION PRESSURIZED (MISCELLANEOUS) ×1 IMPLANT
STOPCOCK MORSE 400PSI 3WAY (MISCELLANEOUS) ×2 IMPLANT
TUBING CIL FLEX 10 FLL-RA (TUBING) ×2 IMPLANT
WIRE BENTSON .035X145CM (WIRE) ×2 IMPLANT
WIRE SPARTACORE .014X190CM (WIRE) ×2 IMPLANT

## 2015-10-10 NOTE — Op Note (Signed)
    Patient name: [name] [name] MRN: [id number] DOB: 08-04-1961 Sex: female  10/10/2015 Pre-operative Diagnosis: Poorly functioning left arm fistula Post-operative diagnosis:  Same Surgeon:  [name] Procedure Performed:  1.  Ultrasound-guided access, left basilic vein  2.  Fistulogram  3.  Angioplasty, left basilic vein  4.  Follow-up 2   Indications:  The patient has previous undergone left basilic vein transposition.  She is having difficulty with access.  She is here for further evaluation  Procedure:  The patient was identified in the holding area and taken to room 8.  The patient was then placed supine on the table and prepped and draped in the usual sterile fashion.  A time out was called.  Ultrasound was used to evaluate the fistula.  The vein was patent and compressible.  A digital ultrasound image was acquired.  The fistula was then accessed under ultrasound guidance using a micropuncture needle.  An 018 wire was then asvanced without resistance and a micropuncture sheath was placed.  Contrast injections were then performed through the sheath.  Findings:  The arterial venous anastomosis is widely patent.  There is a stent within the mid basilic vein which is widely patent.  As the basilic vein joins the deep system there is approximately a 50% narrowing.  The central venous system is widely patent   Intervention:  Over an 035 wire, a 5 French sheath was inserted.  No heparin was given.  An 014 wire was easily advanced across the lesion.  I initially selected a 6 x 60 Mustang balloon and perform balloon angioplasty and rated pressure for 1 minute.  Completion imaging revealed suboptimal result.  I therefore upsized to a 8 x 60 Sterling balloon and perform balloon angioplasty at 8 atm for 1 minute.  Follow-up imaging revealed significant improvement in the narrowing.  At this point, the wire and balloon were removed.  The sheath was removed over a pursestring suture.  There were no  immediate complications  Impression:  #1  no central venous stenosis  #2  the arterial venous anastomosis is widely patent  #3  approximately 50-60% stenosis in the basilic vein just before it enters the deep system.  This was successfully dilated using an 8 x 60 balloon with residual stenosis less than 10%  #4  the fistula is ready for use and can be used tomorrow.  #5  suture will be removed in dialysis tomorrow   V. [name], M.D. [facility] Office: [phone] Pager:  [phone]

## 2015-10-10 NOTE — Discharge Instructions (Signed)
Fistulogram, Care After °Refer to this sheet in the next few weeks. These instructions provide you with information on caring for yourself after your procedure. Your health care provider may also give you more specific instructions. Your treatment has been planned according to current medical practices, but problems sometimes occur. Call your health care provider if you have any problems or questions after your procedure. °WHAT TO EXPECT AFTER THE PROCEDURE °After your procedure, it is typical to have the following: °· A small amount of discomfort in the area where the catheters were placed. °· A small amount of bruising around the fistula. °· Sleepiness and fatigue. °HOME CARE INSTRUCTIONS °· Rest at home for the day following your procedure. °· Do not drive or operate heavy machinery while taking pain medicine. °· Take medicines only as directed by your health care provider. °· Do not take baths, swim, or use a hot tub until your health care provider approves. You may shower 24 hours after the procedure or as directed by your health care provider. °· There are many different ways to close and cover an incision, including stitches, skin glue, and adhesive strips. Follow your health care provider's instructions on: °¨ Incision care. °¨ Bandage (dressing) changes and removal. °¨ Incision closure removal. °· Monitor your dialysis fistula carefully. °SEEK MEDICAL CARE IF: °· You have drainage, redness, swelling, or pain at your catheter site. °· You have a fever. °· You have chills. °SEEK IMMEDIATE MEDICAL CARE IF: °· You feel weak. °· You have trouble balancing. °· You have trouble moving your arms or legs. °· You have problems with your speech or vision. °· You can no longer feel a vibration or buzz when you put your fingers over your dialysis fistula. °· The limb that was used for the procedure: °¨ Swells. °¨ Is painful. °¨ Is cold. °¨ Is discolored, such as blue or pale white. °  °This information is not intended  to replace advice given to you by your health care provider. Make sure you discuss any questions you have with your health care provider. °  °Document Released: 03/06/2014 Document Reviewed: 03/06/2014 °Elsevier Interactive Patient Education ©2016 [employer] ° °

## 2015-10-10 NOTE — Interval H&P Note (Signed)
History and Physical Interval Note:  10/10/2015 8:18 AM  [name] [name]  has presented today for surgery, with the diagnosis of end stage renal  The various methods of treatment have been discussed with the patient and family. After consideration of risks, benefits and other options for treatment, the patient has consented to  Procedure(s): Fistulagram (N/A) as a surgical intervention .  The patient's history has been reviewed, patient examined, no change in status, stable for surgery.  I have reviewed the patient's chart and labs.  Questions were answered to the patient's satisfaction.     [name]  I can feel a thrill in her fisula, however it is more pulsatile.  She is having trouble with HD.  Will plan on fistulogram with intervention if needed.  Risks and details of procedure discussed with patient  [name]

## 2015-10-10 NOTE — H&P (View-Only) (Signed)
    Postoperative Access Visit   History of Present Illness  [name] [name] is a 54 y.o. year old female who had a carotid artery duplex today, was not scheduled to see a provider, but asked to have tugging at right IJ HD catheter evaluated. Right IJ HD catheter was placed by Dr. [name] on 10/05/15 while left upper arm AVF is being revised. She dialyzes TTS. The right IJ HD catheter is secured by 2 sutures and these are tugging the skin upward. She obtains relief by wearing her bra day and night which pushes up her right breast thereby decreasing the tugging. The right IJ sight shows no signs of infection; a 2x2 gauze dressing covers the insertion site. The pt states she has had tingling in her left hand since her stroke. Her left radial and ulnar pulses are not palpable, left upper arm AVF with palpable thrill. Both hands are warm. Bilateral hand grip is 5/5. The patient is able to complete their activities of daily living.    For VQI Use Only  PRE-ADM LIVING: Home  AMB STATUS: Ambulatory  Physical Examination There were no vitals filed for this visit. There is no weight on file to calculate BMI.   Medical Decision Making  [name] [name] is a 54 y.o. year old female who presents s/p right IJ HD catheter placed on 10/05/15 while left upper arm AVF is being revised. She dialyzes TTS. The right IJ HD catheter is secured by 2 sutures and these are tugging the skin upward. She obtains relief by wearing her bra day and night which pushes up her right breast thereby decreasing the tugging. The pt states the tugging is mostly relieved by wearing her bra. She has a follow up appointment with Dr. [name] on 10/16/15 and will discuss with him whether the sutures need to be clipped and resutured.   [name] [name], Sharmon Leyden, RN, MSN, FNP-C [facility] Office: [phone]  10/09/2015, 11:25 AM  Clinic MD: Early

## 2015-10-11 ENCOUNTER — Telehealth: Payer: Self-pay | Admitting: Surgery

## 2015-10-11 NOTE — Telephone Encounter (Signed)
-----   Message from Mena Goes, RN sent at 10/10/2015  3:02 PM EST ----- Regarding: RE: schedule Yes please, bump it out another week if possible.   ----- Message -----    From: Gena Fray    Sent: 10/10/2015   1:44 PM      To: Mena Goes, RN Subject: RE: schedule                                   Does this take place of the appointment that she already has with [facility] for post revision?   Thanks, Hinton Dyer ----- Message -----    From: Mena Goes, RN    Sent: 10/10/2015   9:34 AM      To: [facility] Admin Pool Subject: schedule                                       2 weeks  ----- Message -----    From: [name], MD    Sent: 10/10/2015   9:13 AM      To: [facility] Charge Pool  10/10/2015:  Surgeon:  [name] Procedure Performed:  1.  Ultrasound-guided access, left basilic vein  2.  Fistulogram  3.  Angioplasty, left basilic vein  4.  Follow-up 2

## 2015-10-11 NOTE — Telephone Encounter (Signed)
Lm for pt re appt, dpm  °

## 2015-10-15 ENCOUNTER — Ambulatory Visit (AMBULATORY_SURGERY_CENTER): Payer: Self-pay

## 2015-10-15 VITALS — Ht 65.5 in | Wt 134.4 lb

## 2015-10-15 DIAGNOSIS — Z860100 Personal history of colon polyps, unspecified: Secondary | ICD-10-CM

## 2015-10-15 MED ORDER — SUPREP BOWEL PREP KIT 17.5-3.13-1.6 GM/177ML PO SOLN: 1.0000 | Freq: Once | ORAL | Status: DC

## 2015-10-15 NOTE — Progress Notes (Signed)
No allergies to eggs or soy or flu shot No diet/weight loss meds No home oxygen No past problems with anesthesia  No email/internet

## 2015-10-16 ENCOUNTER — Encounter: Payer: Managed Care, Other (non HMO) | Admitting: Vascular Surgery

## 2015-10-24 ENCOUNTER — Encounter: Payer: Self-pay | Admitting: Surgery

## 2015-10-26 ENCOUNTER — Encounter: Payer: Managed Care, Other (non HMO) | Admitting: Gastroenterology

## 2015-10-31 ENCOUNTER — Encounter: Payer: Managed Care, Other (non HMO) | Admitting: Surgery

## 2015-11-01 ENCOUNTER — Encounter: Payer: Self-pay | Admitting: Surgery

## 2015-11-09 ENCOUNTER — Ambulatory Visit (INDEPENDENT_AMBULATORY_CARE_PROVIDER_SITE_OTHER): Payer: Managed Care, Other (non HMO) | Admitting: Surgery

## 2015-11-09 ENCOUNTER — Encounter: Payer: Self-pay | Admitting: Surgery

## 2015-11-09 VITALS — BP 113/73 | HR 81 | Temp 98.4°F | Resp 16 | Ht 65.5 in | Wt 133.0 lb

## 2015-11-09 DIAGNOSIS — Z992 Dependence on renal dialysis: Secondary | ICD-10-CM

## 2015-11-09 DIAGNOSIS — N186 End stage renal disease: Secondary | ICD-10-CM

## 2015-11-09 NOTE — Progress Notes (Signed)
Patient name: URI COVEY MRN: [id number] DOB: [birth year] Sex: female     Chief Complaint  Patient presents with  . Re-evaluation    S/P Per. Vasc. Cath.  10-10-15   C/O  Left Arm numbness    HISTORY OF PRESENT ILLNESS:  The patient is back for follow-up.  On 02/14/2015 she had a first stage basilic vein transposition by Dr. Donnetta Hutching.  On 05/16/2015 she had a second stage procedure by Dr. Donnetta Hutching. On 09/26/2015 she had banding of the proximal anastomosis by Dr. [name] for steal syndrome. At some point in time, she has had a stent placed within her basilic vein by the renal access center.  I studied her on 12 7 and found the stent to be widely patent.  There was some residual stenosis proximal to the stent which was dilated with the 8 mm balloon.  She continues to have difficulty with cannulation , and in fact she still has a catheter in place as a backup if they can't cannulate her fistula.  She also states that her hand is very numb and cold particulary after dialysis  Past Medical History  Diagnosis Date  . Hypertension   . Lupus ([place])   . CKD (chronic kidney disease)     due to lupus/Dr. [name]  . Stenosis of cervical spine region     with HNP at C5/6, C6/7  . Metatarsal bone fracture right    4th  . 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 Arthritis     RA  . Anemia   . Stroke ([place]) 11/2014    left sided weakness, dysphagia  . Pain in joints   . Paresthesias   . Hyperlipidemia   . Colitis     ? per hospital notes 11/2014  . Lower GI bleed   . Hyponatremia   . Metabolic acidosis   . Hyperplastic colon polyp   . Hemorrhoids   . Blood transfusion without reported diagnosis   . Myocardial infarction ([place])   . Diabetes mellitus ([place])     border line    Past Surgical History  Procedure Laterality Date  . Breast biopsy    . Tubal ligation    . Insertion of dialysis catheter Right 02/05/2015    Procedure: INSERTION OF DIALYSIS  CATHETER - RIGHT INTERNAL JUGULAR;  Surgeon: Conrad Cramerton, MD;  Location: [place];  Service: Vascular;  Laterality: Right;  . Bascilic vein transposition Left 02/14/2015    Procedure: BASILIC VEIN Fistula Creation First Stage;  Surgeon: Rosetta Posner, MD;  Location: [place];  Service: Vascular;  Laterality: Left;  . Colonoscopy w/ biopsies and polypectomy    . Bascilic vein transposition Left 03/28/2015    Procedure: LEFT ARM SECOND STAGE BASCILIC VEIN TRANSPOSITION;  Surgeon: Rosetta Posner, MD;  Location: [place];  Service: Vascular;  Laterality: Left;  . Revison of arteriovenous fistula Left 09/26/2015    Procedure: Revision Left Basilic Vein Transposition with Banding using Gortex graft;  Surgeon: Mal Misty, MD;  Location: [place];  Service: Vascular;  Laterality: Left;  . Insertion of dialysis catheter N/A 10/05/2015    Procedure: INSERTION OF DIALYSIS CATHETER Right Internal Jugular.;  Surgeon: Mal Misty, MD;  Location: [place];  Service: Vascular;  Laterality: N/A;  . Peripheral vascular catheterization N/A 10/10/2015    Procedure: Fistulagram;  Surgeon: [name], MD;  Location:  [facility];  Service: Cardiovascular;  Laterality: N/A;    Social History   Social History  . Marital Status: Single    Spouse Name: N/A  . Number of Children: N/A  . Years of Education: N/A   Occupational History  . Not on file.   Social History Main Topics  . Smoking status: Former Smoker -- 0.50 packs/day for 30 years    Types: Cigarettes    Quit date: 09/22/2014  . Smokeless tobacco: Never Used  . Alcohol Use: No  . Drug Use: No  . Sexual Activity: Not on file   Other Topics Concern  . Not on file   Social History Narrative    Family History  Problem Relation Age of Onset  . Hypertension    . Lupus    . Rheum arthritis    . Hypertension Mother   . Diabetes Mother   . Hypertension Sister   . Diabetes Father   . Hypertension Maternal Grandmother   . Colon cancer Neg Hx      Allergies as of 11/09/2015 - Review Complete 11/09/2015  Allergen Reaction Noted  . Sulfa antibiotics Itching 03/01/2012    Current Outpatient Prescriptions on File Prior to Visit  Medication Sig Dispense Refill  . acetaminophen (TYLENOL) 325 MG tablet Take 2 tablets (650 mg total) by mouth every 4 (four) hours as needed for mild pain.    Marland Kitchen ALPRAZolam (XANAX) 0.25 MG tablet Take 1 tablet (0.25 mg total) by mouth every 8 (eight) hours as needed for anxiety (0.'25mg'$ -0.'5mg'$ ). 60 tablet 0  . cinacalcet (SENSIPAR) 30 MG tablet Take 30 mg by mouth daily.    Marland Kitchen docusate sodium (COLACE) 50 MG capsule Take 50 mg by mouth 2 (two) times daily.    Marland Kitchen HYDROcodone-acetaminophen (NORCO) 7.5-325 MG tablet Take 1 tablet by mouth every 4 (four) hours as needed for severe pain.     . hydroxychloroquine (PLAQUENIL) 200 MG tablet Take 1 tablet (200 mg total) by mouth 2 (two) times daily. 60 tablet 1  . lidocaine-prilocaine (EMLA) cream Apply 1 application topically as needed.    Marland Kitchen LYRICA 75 MG capsule take 1 capsule by mouth once daily 30 capsule 3  . multivitamin (RENA-VIT) TABS tablet Take 1 tablet by mouth daily.    Marland Kitchen oxyCODONE-acetaminophen (PERCOCET/ROXICET) 5-325 MG tablet Take 1 tablet by mouth every 6 (six) hours as needed. 20 tablet 0  . pantoprazole (PROTONIX) 40 MG tablet Take 1 tablet (40 mg total) by mouth daily. 30 tablet 11  . pravastatin (PRAVACHOL) 20 MG tablet Take 20 mg by mouth daily.    Marland Kitchen RENVELA 2.4 G PACK Take 2.4-4.8 g by mouth 3 (three) times daily with meals. 2 PACKS WITH MEALS 3 TIMES DAILY, 1 PACK WITH SNACKS    . SUPREP BOWEL PREP SOLN Take 1 kit by mouth once. 354 mL 0  . temazepam (RESTORIL) 30 MG capsule Take 30 mg by mouth at bedtime as needed for sleep.    Marland Kitchen thiamine 100 MG tablet Take 1 tablet (100 mg total) by mouth daily. 30 tablet 1   No current facility-administered medications on file prior to visit.     REVIEW OF SYSTEMS:  see history of present illness  PHYSICAL  EXAMINATION:   Vital signs are  Filed Vitals:   11/09/15 0921  BP: 113/73  Pulse: 81  Temp: 98.4 F (36.9 C)  TempSrc: Oral  Resp: 16  Height: 5' 5.5" (1.664 m)  Weight: 133 lb (60.328 kg)  SpO2: 100%   Body mass index is 21.79 kg/(m^2). General: The patient appears their stated age.  palpable thrill within fistula but becomes more pulsatile in the upper arm.  I cannot palpate a radial pulse.    Assessment:  end-stage renal disease Plan:  patient is scheduled for access intervention by the renal access center next week. I suspect that she is going to need to have this fistula ligated with plans for new access.  Currently they have difficulty cannulating it and therefore have not gotten rid of her catheter.  Makes no sense to have a fistula that is marginally functional and a catheter simultaneously.  In addition she complains of pain and numbness in her left hand which most likely is a steal syndrome.  She did not get any benefit from the banding procedure.  I told the patient to contact us after she has her procedure next week and we will determine what the next step is.  She is reluctant to have a access placed in a right arms and she is right-handed.  Eldridge Abrahams, M.D. [facility] Office: [phone] Pager:  [phone]

## 2015-11-11 ENCOUNTER — Encounter (HOSPITAL_COMMUNITY): Payer: Self-pay | Admitting: Nurse Practitioner

## 2015-11-11 ENCOUNTER — Emergency Department (HOSPITAL_COMMUNITY)
Admission: EM | Admit: 2015-11-11 | Discharge: 2015-11-11 | Disposition: A | Discharge status: Home / Self Care | Payer: Managed Care, Other (non HMO) | Attending: Emergency Medicine | Admitting: Emergency Medicine

## 2015-11-11 DIAGNOSIS — Z79899 Other long term (current) drug therapy: Secondary | ICD-10-CM | POA: Insufficient documentation

## 2015-11-11 DIAGNOSIS — G8929 Other chronic pain: Secondary | ICD-10-CM | POA: Diagnosis not present

## 2015-11-11 DIAGNOSIS — Z862 Personal history of diseases of the blood and blood-forming organs and certain disorders involving the immune mechanism: Secondary | ICD-10-CM | POA: Diagnosis not present

## 2015-11-11 DIAGNOSIS — Z8673 Personal history of transient ischemic attack (TIA), and cerebral infarction without residual deficits: Secondary | ICD-10-CM | POA: Diagnosis not present

## 2015-11-11 DIAGNOSIS — I252 Old myocardial infarction: Secondary | ICD-10-CM | POA: Diagnosis not present

## 2015-11-11 DIAGNOSIS — Z87891 Personal history of nicotine dependence: Secondary | ICD-10-CM | POA: Insufficient documentation

## 2015-11-11 DIAGNOSIS — M1990 Unspecified osteoarthritis, unspecified site: Secondary | ICD-10-CM | POA: Diagnosis not present

## 2015-11-11 DIAGNOSIS — T8249XA Other complication of vascular dialysis catheter, initial encounter: Secondary | ICD-10-CM | POA: Insufficient documentation

## 2015-11-11 DIAGNOSIS — Y658 Other specified misadventures during surgical and medical care: Secondary | ICD-10-CM | POA: Insufficient documentation

## 2015-11-11 DIAGNOSIS — Z86010 Personal history of colonic polyps: Secondary | ICD-10-CM | POA: Insufficient documentation

## 2015-11-11 DIAGNOSIS — E785 Hyperlipidemia, unspecified: Secondary | ICD-10-CM | POA: Diagnosis not present

## 2015-11-11 DIAGNOSIS — Z992 Dependence on renal dialysis: Secondary | ICD-10-CM | POA: Diagnosis not present

## 2015-11-11 DIAGNOSIS — I120 Hypertensive chronic kidney disease with stage 5 chronic kidney disease or end stage renal disease: Secondary | ICD-10-CM | POA: Insufficient documentation

## 2015-11-11 DIAGNOSIS — N186 End stage renal disease: Secondary | ICD-10-CM | POA: Insufficient documentation

## 2015-11-11 NOTE — Discharge Instructions (Signed)
PICC Removal A peripherally inserted central catheter (PICC) is a long, thin, flexible tube that a health care provider can insert into a vein in your upper arm. It is a type of IV. Having a PICC in place gives health care providers quick access to your veins. It is a good way to distribute medicines and fluids quickly throughout your body. LET YOUR HEALTH CARE PROVIDER KNOW ABOUT:  Any allergies you have.  All medicines you are taking, including vitamins, herbs, eye drops, creams, and over-the-counter medicines.  Previous problems you or members of your family have had with the use of anesthetics.  Any blood disorders you have.  Previous surgeries you have had.  Medical conditions you have. RISKS AND COMPLICATIONS Generally, this is a safe procedure. However, as with any procedure, problems can occur. Possible problems include:  Bleeding.  Infection. BEFORE THE PROCEDURE You need an order from your health care provider to have your PICC removed. Only a health care provider trained in PICC removal should take it out.You may have your PICC removed in the hospital or in an outpatient setting. PROCEDURE Having a PICC removed is usually painless. Removal of the tape that holds the PICC in place may be the most uncomfortable part. Do not take out the PICC yourself. Only a trained clinical professional, such as a PICC nurse, should remove it. If your health care provider thinks your PICC is infected, the tip may be sent to the lab for testing. After taking out your PICC, your health care provider may:   Hold gentle pressure on the exit site.  Apply some antibiotic ointment.  Place a small bandage over the insertion site. AFTER THE PROCEDURE You should be able to remove the bandage after 24 hours. Follow all your health care provider's instructions.   Keep the insertion site clean by washing it gently with soap and water.  Do not pick or remove a scab.  Avoid strenuous physical  activity for a day or two.  Let your health care provider know if you develop redness, soreness, bleeding, swelling, or drainage from the insertion site.  Let your health care provider know if you develop chills or fever.   This information is not intended to replace advice given to you by your health care provider. Make sure you discuss any questions you have with your health care provider.   Document Released: 04/09/2010 Document Revised: 03/06/2015 Document Reviewed: 08/12/2013 Elsevier Interactive Patient Education 2016 Elsevier Inc.  

## 2015-11-11 NOTE — ED Provider Notes (Addendum)
CSN: [id number]     Arrival date & time 11/11/15  1444 History   First MD Initiated Contact with Patient 11/11/15 1642     Chief Complaint  Patient presents with  . Vascular Access Problem     (Consider location/radiation/quality/duration/timing/severity/associated sxs/prior Treatment) HPI Comments: Patient is a 55 year old female with a history of end-stage renal disease on dialysis who also had a temporary dialysis catheter in her right chest wall which was pulled out this morning when she woke up. The catheter had been there for some time and there were discussing whether to remove it. She has been obtaining dialysis through her left fistula at this time. She last dialyzed on Thursday. She denies any chest pain or shortness of breath. The catheter site continue to do so she came in for further evaluation. She denies fever or other complaints.  The history is provided by the patient.    Past Medical History  Diagnosis Date  . Hypertension   . Lupus ([place])   . CKD (chronic kidney disease)     due to lupus/Dr. [name]  . Stenosis of cervical spine region     with HNP at C5/6, C6/7  . Metatarsal bone fracture right    4th  . 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 Arthritis     RA  . Anemia   . Stroke ([place]) 11/2014    left sided weakness, dysphagia  . Pain in joints   . Paresthesias   . Hyperlipidemia   . Colitis     ? per hospital notes 11/2014  . Lower GI bleed   . Hyponatremia   . Metabolic acidosis   . Hyperplastic colon polyp   . Hemorrhoids   . Blood transfusion without reported diagnosis   . Myocardial infarction ([place])   . Diabetes mellitus ([place])     border line   Past Surgical History  Procedure Laterality Date  . Breast biopsy    . Tubal ligation    . Insertion of dialysis catheter Right 02/05/2015    Procedure: INSERTION OF DIALYSIS CATHETER - RIGHT INTERNAL JUGULAR;  Surgeon: Conrad [place], MD;  Location: [place];   Service: Vascular;  Laterality: Right;  . Bascilic vein transposition Left 02/14/2015    Procedure: BASILIC VEIN Fistula Creation First Stage;  Surgeon: Rosetta Posner, MD;  Location: [place];  Service: Vascular;  Laterality: Left;  . Colonoscopy w/ biopsies and polypectomy    . Bascilic vein transposition Left 03/28/2015    Procedure: LEFT ARM SECOND STAGE BASCILIC VEIN TRANSPOSITION;  Surgeon: Rosetta Posner, MD;  Location: [place];  Service: Vascular;  Laterality: Left;  . Revison of arteriovenous fistula Left 09/26/2015    Procedure: Revision Left Basilic Vein Transposition with Banding using Gortex graft;  Surgeon: Mal Misty, MD;  Location: [place];  Service: Vascular;  Laterality: Left;  . Insertion of dialysis catheter N/A 10/05/2015    Procedure: INSERTION OF DIALYSIS CATHETER Right Internal Jugular.;  Surgeon: Mal Misty, MD;  Location: [facility];  Service: Vascular;  Laterality: N/A;  . Peripheral vascular catheterization N/A 10/10/2015    Procedure: Fistulagram;  Surgeon: [name], MD;  Location: [facility];  Service: Cardiovascular;  Laterality: N/A;   Family History  Problem Relation Age of Onset  . Hypertension    . Lupus    . Rheum arthritis    . Hypertension  Mother   . Diabetes Mother   . Hypertension Sister   . Diabetes Father   . Hypertension Maternal Grandmother   . Colon cancer Neg Hx    Social History  Substance Use Topics  . Smoking status: Former Smoker -- 0.50 packs/day for 30 years    Types: Cigarettes    Quit date: 09/22/2014  . Smokeless tobacco: Never Used  . Alcohol Use: No   OB History    No data available     Review of Systems  All other systems reviewed and are negative.     Allergies  Sulfa antibiotics  Home Medications   Prior to Admission medications   Medication Sig Start Date End Date Taking? Authorizing Provider  acetaminophen (TYLENOL) 325 MG tablet Take 2 tablets (650 mg total) by mouth every 4 (four) hours as needed for  mild pain. 10/25/14   Bary Leriche, PA-C  ALPRAZolam (XANAX) 0.25 MG tablet Take 1 tablet (0.25 mg total) by mouth every 8 (eight) hours as needed for anxiety (0.4m-0.5mg). 02/22/15   DLavon PaganiniAngiulli, PA-C  cinacalcet (SENSIPAR) 30 MG tablet Take 30 mg by mouth daily.    Historical Provider, MD  docusate sodium (COLACE) 50 MG capsule Take 50 mg by mouth 2 (two) times daily.    Historical Provider, MD  HYDROcodone-acetaminophen (NORCO) 7.5-325 MG tablet Take 1 tablet by mouth every 4 (four) hours as needed for severe pain.  09/03/15   Historical Provider, MD  hydroxychloroquine (PLAQUENIL) 200 MG tablet Take 1 tablet (200 mg total) by mouth 2 (two) times daily. 07/02/15   ACharlett Blake MD  lidocaine-prilocaine (EMLA) cream Apply 1 application topically as needed.    Historical Provider, MD  LYRICA 75 MG capsule take 1 capsule by mouth once daily 07/18/15   JDarreld Mclean MD  multivitamin (RENA-VIT) TABS tablet Take 1 tablet by mouth daily.    Historical Provider, MD  oxyCODONE-acetaminophen (PERCOCET/ROXICET) 5-325 MG tablet Take 1 tablet by mouth every 6 (six) hours as needed. 09/26/15   EUlyses Amor PA-C  pantoprazole (PROTONIX) 40 MG tablet Take 1 tablet (40 mg total) by mouth daily. 02/22/15   DLavon PaganiniAngiulli, PA-C  pravastatin (PRAVACHOL) 20 MG tablet Take 20 mg by mouth daily.    Historical Provider, MD  RENVELA 2.4 G PACK Take 2.4-4.8 g by mouth 3 (three) times daily with meals. 2 PACKS WITH MEALS 3 TIMES DAILY, 1 PACK WITH SNACKS 08/07/15   Historical Provider, MD  SOhiopyleTake 1 kit by mouth once. 10/15/15   DMilus Banister MD  temazepam (RESTORIL) 30 MG capsule Take 30 mg by mouth at bedtime as needed for sleep.    Historical Provider, MD  thiamine 100 MG tablet Take 1 tablet (100 mg total) by mouth daily. 02/22/15   Daniel J Angiulli, PA-C   BP 155/75 mmHg  Pulse 83  Temp(Src) 98 F (36.7 C) (Oral)  Resp 16  Ht _0  (1.651 m)  Wt 138 lb (62.596 kg)   BMI 22.96 kg/m2  SpO2 97%  LMP 07/18/2012 Physical Exam  Constitutional: She is oriented to person, place, and time. She appears well-developed and well-nourished. No distress.  HENT:  Head: Normocephalic and atraumatic.  Mouth/Throat: Oropharynx is clear and moist.  Eyes: Conjunctivae and EOM are normal. Pupils are equal, round, and reactive to light.  Neck: Normal range of motion. Neck supple.  Cardiovascular: Normal rate, regular rhythm and intact distal pulses.   No murmur  heard. Pulmonary/Chest: Effort normal and breath sounds normal. No respiratory distress. She has no wheezes. She has no rales. She exhibits no tenderness.    Musculoskeletal: Normal range of motion. She exhibits no edema or tenderness.  Neurological: She is alert and oriented to person, place, and time.  Skin: Skin is warm and dry. No rash noted. No erythema.  Psychiatric: She has a normal mood and affect. Her behavior is normal.  Nursing note and vitals reviewed.   ED Course  Procedures (including critical care time) Labs Review Labs Reviewed - No data to display  Imaging Review No results found. I have personally reviewed and evaluated these images and lab results as part of my medical decision-making.   EKG Interpretation None      MDM   Final diagnoses:  Complications, dialysis, catheter, mechanical, initial encounter [facility])    Patient presenting today because her hemodialysis temporary catheter in her right chest was pulled out this morning. It continued to bleed so she came in for further evaluation. Patient takes no anticoagulation and states currently they're using the fistula in her left arm and they rarely use the temporary catheter. They were discussing whether to remove it. On exam patient's catheter is intact and is been completely removed prior to arrival. Area where tube was inserted is clean dry and intact. It is currently not using any blood. With movement of her arm through range of  motion and palpation of the insertion site no recurrent bleeding. Patient wound was cleaned and dressing applied. Patient will discuss with her specialists.    Blanchie Dessert, MD 11/11/15 1658  Blanchie Dessert, MD 11/11/15 1701

## 2015-11-11 NOTE — ED Notes (Addendum)
She woke this morning and noticed blood oozing around her HD catheter insertion site, the entire catheter has now become dislodged from her chest. No pain. She does have fistula in L arm but has not been working fully so the chest catheter was placed

## 2015-11-19 ENCOUNTER — Encounter: Payer: Managed Care, Other (non HMO) | Admitting: Surgery

## 2015-11-21 ENCOUNTER — Telehealth: Payer: Self-pay

## 2015-11-21 NOTE — Telephone Encounter (Signed)
Pt requesting hydrochodone refill  Best phone for pt is [phone]

## 2015-11-22 NOTE — Telephone Encounter (Signed)
Left message for pt to call back.  We have not prescribed Hydrocodone. Calling to get more information.

## 2015-11-22 NOTE — Telephone Encounter (Signed)
Pt. Called back and stated that she received the medication while she was in the hospital.   Best call back number is [phone]

## 2015-11-22 NOTE — Telephone Encounter (Signed)
Pt called to check the status of rx req.// pt states that due to dialysis and existing diagnosis she experiences pain at the sight of dialysis venipuncture... Hand goes cold and numb//  Please call to advise

## 2015-11-23 ENCOUNTER — Other Ambulatory Visit: Payer: Self-pay

## 2015-11-23 ENCOUNTER — Ambulatory Visit (INDEPENDENT_AMBULATORY_CARE_PROVIDER_SITE_OTHER): Payer: Managed Care, Other (non HMO) | Admitting: Physician Assistant

## 2015-11-23 ENCOUNTER — Encounter: Payer: Self-pay | Admitting: Family Medicine

## 2015-11-23 VITALS — BP 126/80 | HR 86 | Temp 98.0°F | Resp 17 | Ht 64.5 in | Wt 135.0 lb

## 2015-11-23 DIAGNOSIS — M79602 Pain in left arm: Secondary | ICD-10-CM | POA: Diagnosis not present

## 2015-11-23 DIAGNOSIS — T82898D Other specified complication of vascular prosthetic devices, implants and grafts, subsequent encounter: Secondary | ICD-10-CM

## 2015-11-23 DIAGNOSIS — G609 Hereditary and idiopathic neuropathy, unspecified: Secondary | ICD-10-CM

## 2015-11-23 MED ORDER — HYDROCODONE-ACETAMINOPHEN 7.5-325 MG PO TABS: 1.0000 | ORAL_TABLET | Freq: Four times a day (QID) | ORAL | Status: DC | PRN

## 2015-11-23 NOTE — Telephone Encounter (Signed)
Ok, called pt to RTC for ED follow up. We cannot refill this medication from the hospital.

## 2015-11-23 NOTE — Patient Instructions (Signed)
You may take hydrocodone as needed for arm pain. You can call when you need a refill and you can come pick up. Make sure your kidney doctor draws your A1C to make sure still under control. Return in 3 months for follow up.

## 2015-11-23 NOTE — Progress Notes (Signed)
[facility] [address], [place] [address] [phone]  Date:  11/23/2015   Name:  [name] [name]   DOB:  December 11, 1960   MRN:  [id number]  PCP:  Lamar Blinks, MD    Chief Complaint: patient is requesting pain medication   History of Present Illness:  This is a 55 y.o. female with PMH HTN, HLD, DM2, lupus, hx CVA, ESRD on dialysis with left arm fistula and steal syndrome as complication of dialysis. She is presenting requesting pain medication for her left arm pain. She has been on dialysis since around March-May of 2016. She has left arm and hand pain esp after dialysis. She states her hand gets very cold, numb and sore. She already had tingling in left arm and leg since stroke about 1 year ago. Pt has had some hydrocodone from vascular surgeries and also got some from a dental procedure. She has been taking these as needed for the pain. Takes about #3 per week, 1 after dialysis each time. Goes to dialysis Tues, Thurs and Sat. She is out of the hydrocodone now and wondering if she could get them prescribed now. She asked her nephrologist for pain medicine and he told her she would need to get from her PCP. She takes nephrology and vascular are discussing moving fistula to right arm to see if this will help her left arm pain.  Nephrologist - Dr. Jimmy Footman GI - Dr. Edison Nasuti Cardiologist - Dr. Arita Miss PCP - Dr. Lorelei Pont  Last A1C 4.9 five months ago. She is not taking any medicine for DM.  Lipids normal 5 months except for mildly elevated triglycerides and mildly decreased HDL.  Review of Systems:  Review of Systems See HPI  Patient Active Problem List   Diagnosis Date Noted  . Steal syndrome as complication of dialysis access ([place]) 09/21/2015  . History of CVA (cerebrovascular accident) 02/20/2015  . Debilitated 02/15/2015  . Acute respiratory failure ([place])   . SVT (supraventricular tachycardia) ([place])   . Acute diastolic CHF (congestive heart failure) ([place])    . Encephalopathy acute   . Atelectasis, left   . Confusion   . ARDS (adult respiratory distress syndrome) ([place]) 01/22/2015  . Tachycardia   . Weakness 01/19/2015  . Elevated troponin level 01/19/2015  . Hypotension 01/19/2015  . Shortness of breath 01/18/2015  . Hemorrhoid 12/07/2014  . HCAP (healthcare-associated pneumonia) 11/23/2014  . Fever   . Bleeding gastrointestinal   . Tremor 11/21/2014  . Protein-calorie malnutrition, severe ([place]) 11/20/2014  . Noninfectious gastroenteritis and colitis 11/19/2014  . Acute blood loss anemia   . Hyponatremia   . 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 Secondary cardiomyopathy ([place])   . Noncompliance with medication regimen   . Tobacco abuse   . Sepsis ([place]) 10/10/2014  . Leucocytosis 10/10/2014  . Fluid overload 10/10/2014  . SIRS (systemic inflammatory response syndrome) ([place]) 10/10/2014  . Anemia 10/10/2014  . Hyperkalemia 10/10/2014  . Acute encephalopathy 10/10/2014  . Torticollis, acquired 10/06/2014  . Essential hypertension 10/05/2014  . Acute respiratory acidosis   . ICH (intracerebral hemorrhage) ([place])   . Chronic ankle pain   . Cerebral hemorrhage ([place])   . 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 Hypertensive emergency 09/28/2014  . Iron deficiency anemia 02/18/2008  . Mononeuritis of lower limb 02/18/2008  . History of adrenal insufficiency 02/18/2008  . DIABETES MELLITUS, TYPE II 10/25/2007  . HYPERLIPIDEMIA 10/25/2007  . Systemic lupus erythematosus ([place]) 10/25/2007  . Rheumatoid  arthritis ([place]) 10/25/2007  . COLONIC POLYPS, HYPERPLASTIC 04/08/2006    Prior to Admission medications   Medication Sig Start Date End Date Taking? Authorizing Provider  acetaminophen (TYLENOL) 325 MG tablet Take 2 tablets (650 mg total) by mouth every 4 (four) hours as needed for mild pain. 10/25/14  Yes Ivan Anchors Love, PA-C  ALPRAZolam (XANAX) 0.25 MG tablet Take 1 tablet (0.25 mg total) by mouth every 8 (eight) hours as  needed for anxiety (0.74m-0.5mg). 02/22/15  Yes Daniel J Angiulli, PA-C  cinacalcet (SENSIPAR) 30 MG tablet Take 30 mg by mouth daily.   Yes Historical Provider, MD  docusate sodium (COLACE) 50 MG capsule Take 50 mg by mouth 2 (two) times daily.   Yes Historical Provider, MD  HYDROcodone-acetaminophen (NORCO) 7.5-325 MG tablet Take 1 tablet by mouth every 4 (four) hours as needed for severe pain.  09/03/15  Yes Historical Provider, MD  hydroxychloroquine (PLAQUENIL) 200 MG tablet Take 1 tablet (200 mg total) by mouth 2 (two) times daily. 07/02/15  Yes ACharlett Blake MD  lidocaine-prilocaine (EMLA) cream Apply 1 application topically as needed.   Yes Historical Provider, MD  LYRICA 75 MG capsule take 1 capsule by mouth once daily 07/18/15  Yes JGay FillerCopland, MD  multivitamin (RENA-VIT) TABS tablet Take 1 tablet by mouth daily.   Yes Historical Provider, MD  pantoprazole (PROTONIX) 40 MG tablet Take 1 tablet (40 mg total) by mouth daily. 02/22/15  Yes Daniel J Angiulli, PA-C  pravastatin (PRAVACHOL) 20 MG tablet Take 20 mg by mouth daily.   Yes Historical Provider, MD  RENVELA 2.4 G PACK Take 2.4-4.8 g by mouth 3 (three) times daily with meals. 2 PACKS WITH MEALS 3 TIMES DAILY, 1 PACK WITH SNACKS 08/07/15  Yes Historical Provider, MD  SLingleTake 1 kit by mouth once. 10/15/15  Yes DMilus Banister MD  temazepam (RESTORIL) 30 MG capsule Take 30 mg by mouth at bedtime as needed for sleep.   Yes Historical Provider, MD  thiamine 100 MG tablet Take 1 tablet (100 mg total) by mouth daily. 02/22/15  Yes Daniel J Angiulli, PA-C           Allergies  Allergen Reactions  . Sulfa Antibiotics Itching    Past Surgical History  Procedure Laterality Date  . Breast biopsy    . Tubal ligation    . Insertion of dialysis catheter Right 02/05/2015    Procedure: INSERTION OF DIALYSIS CATHETER - RIGHT INTERNAL JUGULAR;  Surgeon: BConrad Garland MD;  [facility]  Service: Vascular;   Laterality: Right;  . Bascilic vein transposition Left 02/14/2015    Procedure: BASILIC VEIN Fistula Creation First Stage;  Surgeon: TRosetta Posner MD;  [facility]  Service: Vascular;  Laterality: Left;  . Colonoscopy w/ biopsies and polypectomy    . Bascilic vein transposition Left 03/28/2015    Procedure: LEFT ARM SECOND STAGE BASCILIC VEIN TRANSPOSITION;  Surgeon: TRosetta Posner MD;  [facility]  Service: Vascular;  Laterality: Left;  . Revison of arteriovenous fistula Left 09/26/2015    Procedure: Revision Left Basilic Vein Transposition with Banding using Gortex graft;  Surgeon: JMal Misty MD;  [facility] [facility]  Service: Vascular;  Laterality: Left;  . Insertion of dialysis catheter N/A 10/05/2015    Procedure: INSERTION OF DIALYSIS CATHETER Right Internal Jugular.;  Surgeon: JMal Misty MD;  [facility]  Service: Vascular;  Laterality: N/A;  . Peripheral vascular catheterization N/A  10/10/2015    Procedure: Fistulagram;  Surgeon: [name], MD;  Location: [facility];  Service: Cardiovascular;  Laterality: N/A;    Social History  Substance Use Topics  . Smoking status: Former Smoker -- 0.50 packs/day for 30 years    Types: Cigarettes    Quit date: 09/22/2014  . Smokeless tobacco: Never Used  . Alcohol Use: No    Family History  Problem Relation Age of Onset  . Hypertension    . Lupus    . Rheum arthritis    . Hypertension Mother   . Diabetes Mother   . Hypertension Sister   . Diabetes Father   . Hypertension Maternal Grandmother   . Colon cancer Neg Hx     Medication list has been reviewed and updated.  Physical Examination:  Physical Exam  Constitutional: She is oriented to person, place, and time. She appears well-developed and well-nourished. No distress.  HENT:  Head: Normocephalic and atraumatic.  Right Ear: Hearing normal.  Left Ear: Hearing normal.  Nose: Nose normal.  Eyes: Conjunctivae and lids are normal. Right eye  exhibits no discharge. Left eye exhibits no discharge. No scleral icterus.  Cardiovascular: Normal rate, regular rhythm, normal heart sounds and normal pulses.   Pulmonary/Chest: Effort normal and breath sounds normal. No respiratory distress. She has no wheezes. She has no rhonchi. She has no rales.  Musculoskeletal: Normal range of motion.  Neurological: She is alert and oriented to person, place, and time.  Skin: Skin is warm, dry and intact.  Left upper arm with fistula. Ecchymosis present.  Psychiatric: She has a normal mood and affect. Her speech is normal and behavior is normal. Thought content normal.   BP 126/80 mmHg  Pulse 86  Temp(Src) 98 F (36.7 C) (Oral)  Resp 17  Ht 5' 4.5" (1.638 m)  Wt 135 lb (61.236 kg)  BMI 22.82 kg/m2  SpO2 98%  LMP 07/18/2012  Assessment and Plan:  1. Steal syndrome as complication of dialysis access, subsequent encounter 2. Pain of left upper extremity Patient only taking #3 tabs hydrocodone per week, #1 after each dialysis session d/t pain from steal syndrome as a complication of dialysis. Norco working well for her so far. Database consistent with patient's history. #30 given. Advised following up with Dr. Lorelei Pont soon since has been about 4 months since seen. Pt did not want blood work today.  - HYDROcodone-acetaminophen ([place]) 7.5-325 MG tablet; Take 1 tablet by mouth every 6 (six) hours as needed for severe pain.  Dispense: 30 tablet; Refill: 0   Benjaman Pott. Drenda Freeze, MHS [facility]  11/23/2015

## 2015-11-23 NOTE — Telephone Encounter (Signed)
Pharm reqs RF of lyrica

## 2015-11-24 MED ORDER — PREGABALIN 75 MG PO CAPS: 75.0000 mg | ORAL_CAPSULE | Freq: Every day | ORAL | Status: DC

## 2015-11-28 ENCOUNTER — Encounter: Payer: Self-pay | Admitting: Family Medicine

## 2015-12-03 ENCOUNTER — Ambulatory Visit (INDEPENDENT_AMBULATORY_CARE_PROVIDER_SITE_OTHER): Payer: Self-pay | Admitting: Family

## 2015-12-03 ENCOUNTER — Encounter: Payer: Self-pay | Admitting: Family

## 2015-12-03 VITALS — BP 145/76 | HR 91 | Temp 98.8°F | Ht 64.5 in | Wt 137.0 lb

## 2015-12-03 DIAGNOSIS — N186 End stage renal disease: Secondary | ICD-10-CM

## 2015-12-03 DIAGNOSIS — Z992 Dependence on renal dialysis: Secondary | ICD-10-CM

## 2015-12-03 DIAGNOSIS — T82898D Other specified complication of vascular prosthetic devices, implants and grafts, subsequent encounter: Secondary | ICD-10-CM

## 2015-12-03 NOTE — Progress Notes (Signed)
Established Dialysis Access  History of Present Illness  [name] [name] is a 55 y.o. (October 13, 1961) female patient Dr. Dr. Donnetta Hutching, Dr. Trula Slade, and Dr. Bridgett Larsson.  She returns today at the request of personnel at her dialysis center for evaluation of her left upper arm AVF. Pt states she can tolerate only 2.5 hours of HD at her left forearm AVF and has to stop HD due to severe pain in her left hand. Her right side chest tunneled HD catheter was accidentally removed by her grandson about 2 weeks ago; pt states she was seen in [facility] ED for this.  On 02/14/2015 she had a first stage basilic vein transposition by Dr. Donnetta Hutching. On 05/16/2015 she had a second stage procedure by Dr. Donnetta Hutching. On 09/26/2015 she had banding of the proximal anastomosis by Dr. [name] for steal syndrome. At some point in time, she has had a stent placed within her basilic vein by the renal access center. Dr. Trula Slade studied her on 12 7 and found the stent to be widely patent. There was some residual stenosis proximal to the stent which was dilated with the 8 mm balloon. She continues to have difficulty with cannulation , and in fact she still has a catheter in place as a backup if they can't cannulate her fistula. She also states that her hand is very numb and cold particulary after dialysis. The pt states she has had tingling in her left hand since her stroke. Dr. Trula Slade last saw pt on 11/09/15. At that time patient was scheduled for access intervention by the renal access center the following week. Dr. Trula Slade suspected that she was going to need to have this fistula ligated with plans for new access. At that time they had difficulty cannulating it and therefore have not gotten rid of her catheter. Makes no sense to have a fistula that is marginally functional and a catheter simultaneously. In addition she complained of pain and numbness in her left hand which most likely is a steal syndrome. She did not get any benefit from the  banding procedure. Dr. Trula Slade told the patient to contact us after she has her procedure the following week and we will determine what the next step is. She is reluctant to have a access placed in a right arms and she is right-handed. She dialyzes T-TH-SAT.  She has not had previous access procedures.   Past Medical History  Diagnosis Date  . Hypertension   . Lupus ([place])   . CKD (chronic kidney disease)     due to lupus/Dr. [name]  . Stenosis of cervical spine region     with HNP at C5/6, C6/7  . Metatarsal bone fracture right    4th  . 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 Arthritis     RA  . Anemia   . Stroke ([place]) 11/2014    left sided weakness, dysphagia  . Pain in joints   . Paresthesias   . Hyperlipidemia   . Colitis     ? per hospital notes 11/2014  . Lower GI bleed   . Hyponatremia   . Metabolic acidosis   . Hyperplastic colon polyp   . Hemorrhoids   . Blood transfusion without reported diagnosis   . Myocardial infarction ([place])   . Diabetes mellitus ([place])     border line    Social History Social History  Substance Use Topics  .  Smoking status: Former Smoker -- 0.50 packs/day for 30 years    Types: Cigarettes    Quit date: 09/22/2014  . Smokeless tobacco: Never Used  . Alcohol Use: No    Family History Family History  Problem Relation Age of Onset  . Hypertension    . Lupus    . Rheum arthritis    . Hypertension Mother   . Diabetes Mother   . Hypertension Sister   . Diabetes Father   . Hypertension Maternal Grandmother   . Colon cancer Neg Hx     Surgical History Past Surgical History  Procedure Laterality Date  . Breast biopsy    . Tubal ligation    . Insertion of dialysis catheter Right 02/05/2015    Procedure: INSERTION OF DIALYSIS CATHETER - RIGHT INTERNAL JUGULAR;  Surgeon: Conrad , MD;  Location: [facility];  Service: Vascular;  Laterality: Right;  . Bascilic vein transposition Left  02/14/2015    Procedure: BASILIC VEIN Fistula Creation First Stage;  Surgeon: Rosetta Posner, MD;  Location: [facility];  Service: Vascular;  Laterality: Left;  . Colonoscopy w/ biopsies and polypectomy    . Bascilic vein transposition Left 03/28/2015    Procedure: LEFT ARM SECOND STAGE BASCILIC VEIN TRANSPOSITION;  Surgeon: Rosetta Posner, MD;  Location: [facility];  Service: Vascular;  Laterality: Left;  . Revison of arteriovenous fistula Left 09/26/2015    Procedure: Revision Left Basilic Vein Transposition with Banding using Gortex graft;  Surgeon: Mal Misty, MD;  Location: [facility];  Service: Vascular;  Laterality: Left;  . Insertion of dialysis catheter N/A 10/05/2015    Procedure: INSERTION OF DIALYSIS CATHETER Right Internal Jugular.;  Surgeon: Mal Misty, MD;  Location: [facility];  Service: Vascular;  Laterality: N/A;  . Peripheral vascular catheterization N/A 10/10/2015    Procedure: Fistulagram;  Surgeon: [name], MD;  Location: [facility];  Service: Cardiovascular;  Laterality: N/A;    Allergies  Allergen Reactions  . Sulfa Antibiotics Itching    Current Outpatient Prescriptions  Medication Sig Dispense Refill  . acetaminophen (TYLENOL) 325 MG tablet Take 2 tablets (650 mg total) by mouth every 4 (four) hours as needed for mild pain.    Marland Kitchen ALPRAZolam (XANAX) 0.25 MG tablet Take 1 tablet (0.25 mg total) by mouth every 8 (eight) hours as needed for anxiety (0.28m-0.5mg). 60 tablet 0  . cinacalcet (SENSIPAR) 30 MG tablet Take 30 mg by mouth daily.    .Marland Kitchendocusate sodium (COLACE) 50 MG capsule Take 50 mg by mouth 2 (two) times daily.    .Marland KitchenHYDROcodone-acetaminophen (NORCO) 7.5-325 MG tablet Take 1 tablet by mouth every 6 (six) hours as needed for severe pain. 30 tablet 0  . hydroxychloroquine (PLAQUENIL) 200 MG tablet Take 1 tablet (200 mg total) by mouth 2 (two) times daily. 60 tablet 1  . lidocaine-prilocaine (EMLA) cream Apply 1 application topically as needed.    . multivitamin  (RENA-VIT) TABS tablet Take 1 tablet by mouth daily.    . pantoprazole (PROTONIX) 40 MG tablet Take 1 tablet (40 mg total) by mouth daily. 30 tablet 11  . pravastatin (PRAVACHOL) 20 MG tablet Take 20 mg by mouth daily.    . pregabalin (LYRICA) 75 MG capsule Take 1 capsule (75 mg total) by mouth daily. 30 capsule 6  . RENVELA 2.4 G PACK Take 2.4-4.8 g by mouth 3 (three) times daily with meals. 2 PACKS WITH MEALS 3 TIMES DAILY, 1 PACK WITH SNACKS    .  SUPREP BOWEL PREP SOLN Take 1 kit by mouth once. 354 mL 0  . temazepam (RESTORIL) 30 MG capsule Take 30 mg by mouth at bedtime as needed for sleep.    Marland Kitchen thiamine 100 MG tablet Take 1 tablet (100 mg total) by mouth daily. 30 tablet 1   No current facility-administered medications for this visit.     REVIEW OF SYSTEMS: see HPI for pertinent positives and negatives    PHYSICAL EXAMINATION:  Filed Vitals:   12/03/15 0952 12/03/15 0956  BP: 147/81 145/76  Pulse: 91   Temp: 98.8 F (37.1 C)   TempSrc: Oral   Height: 5' 4.5" (1.638 m)   Weight: 137 lb (62.143 kg)   SpO2: 97%    Body mass index is 23.16 kg/(m^2).  General: The patient appears their stated age.   HEENT:  No gross abnormalities Pulmonary: Respirations are non-labored Abdomen: Soft and non-tender. Musculoskeletal: There are no major deformities.   Neurologic: No focal weakness or paresthesias are detected. Right hand grip is 5/5, left is 4/5 Skin: There are no ulcer or rashes. Psychiatric: The patient has normal affect. Cardiovascular: There is a regular rate and rhythm. Left radial pulse is not palpable. Left upper arm AVF has a palpable thrill and audible bruit at the distal portion, the mid and proximal portions have a palpable pulse, do not seem to have a thrill or bruit.    Medical Decision Making  CHRISTAN CICCARELLI is a 55 y.o. female who presents with ESRD requiring hemodialysis.  I discussed this with Dr. Trula Slade. We discussed whether she wants to try to continue  HD with this left upper arm AVF as she cannot complete her HD due to severe pain in her left hand, and tingling in her left hand all of the time. Pt states she wants to try dialyzing 4 days/week instead of 3 days/week before scheduling the below procedures, states she will speak with Dr. Jimmy Footman about this.Pt states she willl call and let us know if she changes her mind. Option left open to pt for another HD access, have the current AVF ligated, and have a temporary tunneled dialysis catheter.  Follow up with [place] as needed.   Raul Winterhalter, Sharmon Leyden, RN, MSN, FNP-C [facility] Office: [phone]  12/03/2015, 10:04 AM  Clinic MD: Trula Slade

## 2015-12-06 ENCOUNTER — Other Ambulatory Visit: Payer: Self-pay | Admitting: *Deleted

## 2015-12-11 ENCOUNTER — Encounter: Payer: Self-pay | Admitting: *Deleted

## 2015-12-11 ENCOUNTER — Telehealth: Payer: Self-pay | Admitting: *Deleted

## 2015-12-11 NOTE — Telephone Encounter (Signed)
Have tried several times to reach patient regarding her surgery time on 12-21-15. I will fax this information to [facility] on [address] and they will give it to her at dialysis today.

## 2015-12-12 ENCOUNTER — Ambulatory Visit (AMBULATORY_SURGERY_CENTER): Payer: Managed Care, Other (non HMO) | Admitting: Gastroenterology

## 2015-12-12 ENCOUNTER — Encounter: Payer: Self-pay | Admitting: Gastroenterology

## 2015-12-12 VITALS — BP 130/72 | HR 82 | Temp 98.2°F | Resp 18 | Ht 65.5 in | Wt 134.0 lb

## 2015-12-12 DIAGNOSIS — Z86010 Personal history of colonic polyps: Secondary | ICD-10-CM | POA: Diagnosis not present

## 2015-12-12 MED ORDER — SODIUM CHLORIDE 0.9 % IV SOLN: 500.0000 mL | INTRAVENOUS | Status: DC

## 2015-12-12 NOTE — Progress Notes (Signed)
Stable to RR 

## 2015-12-12 NOTE — Patient Instructions (Signed)
YOU HAD AN ENDOSCOPIC PROCEDURE TODAY AT [facility]:   Refer to the procedure report that was given to you for any specific questions about what was found during the examination.  If the procedure report does not answer your questions, please call your gastroenterologist to clarify.  If you requested that your care partner not be given the details of your procedure findings, then the procedure report has been included in a sealed envelope for you to review at your convenience later.  YOU SHOULD EXPECT: Some feelings of bloating in the abdomen. Passage of more gas than usual.  Walking can help get rid of the air that was put into your GI tract during the procedure and reduce the bloating. If you had a lower endoscopy (such as a colonoscopy or flexible sigmoidoscopy) you may notice spotting of blood in your stool or on the toilet paper. If you underwent a bowel prep for your procedure, you may not have a normal bowel movement for a few days.  Please Note:  You might notice some irritation and congestion in your nose or some drainage.  This is from the oxygen used during your procedure.  There is no need for concern and it should clear up in a day or so.  SYMPTOMS TO REPORT IMMEDIATELY:   Following lower endoscopy (colonoscopy or flexible sigmoidoscopy):  Excessive amounts of blood in the stool  Significant tenderness or worsening of abdominal pains  Swelling of the abdomen that is new, acute  Fever of 100F or higher  For urgent or emergent issues, a gastroenterologist can be reached at any hour by calling [phone].   DIET: Your first meal following the procedure should be a small meal and then it is ok to progress to your normal diet. Heavy or fried foods are harder to digest and may make you feel nauseous or bloated.  Likewise, meals heavy in dairy and vegetables can increase bloating.  Drink plenty of fluids but you should avoid alcoholic beverages for 24  hours.  ACTIVITY:  You should plan to take it easy for the rest of today and you should NOT DRIVE or use heavy machinery until tomorrow (because of the sedation medicines used during the test).    FOLLOW UP: Our staff will call the number listed on your records the next business day following your procedure to check on you and address any questions or concerns that you may have regarding the information given to you following your procedure. If we do not reach you, we will leave a message.  However, if you are feeling well and you are not experiencing any problems, there is no need to return our call.  We will assume that you have returned to your regular daily activities without incident.  If any biopsies were taken you will be contacted by phone or by letter within the next 1-3 weeks.  Please call us at [phone] if you have not heard about the biopsies in 3 weeks.    SIGNATURES/CONFIDENTIALITY: You and/or your care partner have signed paperwork which will be entered into your electronic medical record.  These signatures attest to the fact that that the information above on your After Visit Summary has been reviewed and is understood.  Full responsibility of the confidentiality of this discharge information lies with you and/or your care-partner.     You may resume your current medications today. Repeat your colonoscopy in 10 years. Please call if any questions or concerns.

## 2015-12-12 NOTE — Progress Notes (Signed)
No problems noted in the recovery room. maw 

## 2015-12-12 NOTE — Op Note (Signed)
[facility]  [address] [place], [id number]   COLONOSCOPY PROCEDURE REPORT  PATIENT: [name], [name]  MR#: [id number] BIRTHDATE: 1960/12/07 , 37  yrs. old GENDER: female ENDOSCOPIST: Milus Banister, MD PROCEDURE DATE:  12/12/2015 PROCEDURE:   Colonoscopy, screening First Screening Colonoscopy - Avg.  risk and is 50 yrs.  old or older - No.  Prior Negative Screening - Now for repeat screening. 10 or more years since last screening  History of Adenoma - Now for follow-up colonoscopy & has been > or = to 3 yrs.  N/A  Recommend repeat exam, <10 yrs? No ASA CLASS:   Class III INDICATIONS:Screening for colonic neoplasia, Colorectal Neoplasm Risk Assessment for this procedure is average risk, and Colonoscopy 2007 found no precancerous polyps. MEDICATIONS: Monitored anesthesia care, Propofol 150 mg IV, and Lidocaine 40 mg IV  DESCRIPTION OF PROCEDURE:   After the risks benefits and alternatives of the procedure were thoroughly explained, informed consent was obtained.  The digital rectal exam revealed no abnormalities of the rectum.   The [id number]  endoscope was introduced through the anus and advanced to the cecum, which was identified by both the appendix and ileocecal valve. No adverse events experienced.   The quality of the prep was adequate  The instrument was then slowly withdrawn as the colon was fully examined. Estimated blood loss is zero unless otherwise noted in this procedure report.      COLON FINDINGS: A normal appearing cecum, ileocecal valve, and appendiceal orifice were identified.  The ascending, transverse, descending, sigmoid colon, and rectum appeared unremarkable. Retroflexed views revealed no abnormalities. The time to cecum = 3.0 Withdrawal time = 7.1   The scope was withdrawn and the procedure completed. COMPLICATIONS: There were no immediate complications.  ENDOSCOPIC IMPRESSION: Normal  colonoscopy  RECOMMENDATIONS: You should continue to follow colorectal cancer screening guidelines for "routine risk" patients with a repeat colonoscopy in 10 years.   eSigned:  Milus Banister, MD 12/12/2015 3:02 PM

## 2015-12-13 ENCOUNTER — Telehealth: Payer: Self-pay | Admitting: *Deleted

## 2015-12-13 NOTE — Telephone Encounter (Signed)
No answer, left message to call if questions or concerns. 

## 2015-12-19 ENCOUNTER — Encounter (HOSPITAL_COMMUNITY): Payer: Self-pay | Admitting: *Deleted

## 2015-12-19 NOTE — Progress Notes (Signed)
Pt denies SOB, chest pain, and being under the care of a cardiologist. Pt denies having a cardiac cath. Pt made aware to stop otc vitamins, herbal medications, NSAID's and fish oil. Pt verbalized understanding of all pre-op instructions.

## 2015-12-20 MED ORDER — DEXTROSE 5 % IV SOLN
1.5000 g | INTRAVENOUS | Status: AC
  Administered 2015-12-21: 1.5 g via INTRAVENOUS
  Filled 2015-12-20: qty 1.5

## 2015-12-20 MED ORDER — SODIUM CHLORIDE 0.9 % IV SOLN
INTRAVENOUS | Status: DC
  Administered 2015-12-21: 10 mL/h via INTRAVENOUS
  Administered 2015-12-21: 11:00:00 via INTRAVENOUS

## 2015-12-20 NOTE — Anesthesia Preprocedure Evaluation (Addendum)
Anesthesia Evaluation  Patient identified by MRN, date of birth, ID band Patient awake  General Assessment Comment: [name] [name] his a 55 y/o female with a history significant for SLE, CKD on HD, stroke, HTN, MI, and DM2 who presents today for INSERTION OF DIALYSIS CATHETER d/t ESRD by Dr. Nelda Severe. [name].   Reviewed: Allergy & Precautions, NPO status , Patient's Chart, lab work & pertinent test results  History of Anesthesia Complications Negative for: history of anesthetic complications  Airway Mallampati: II  TM Distance: >3 FB Neck ROM: Full    Dental no notable dental hx. (+) Teeth Intact, Dental Advisory Given   Pulmonary shortness of breath and with exertion, pneumonia, resolved, former smoker,    Pulmonary exam normal breath sounds clear to auscultation       Cardiovascular hypertension, Pt. on medications + Past MI and +CHF  Normal cardiovascular exam Rhythm:Regular Rate:Normal     Neuro/Psych PSYCHIATRIC DISORDERS Stroke with left sided weakness, dysphagia Hx of Lupus Parasthesias  Neuromuscular disease CVA, Residual Symptoms    GI/Hepatic negative GI ROS, Neg liver ROS,   Endo/Other  diabetes, Well Controlled, Type 2, Oral Hypoglycemic Agents  Renal/GU CRF and DialysisRenal disease     Musculoskeletal negative musculoskeletal ROS (+) Arthritis , Rheumatoid disorders,  Stenosis of cervical spine region with HNP at C5/6, C6/7     Abdominal   Peds  Hematology negative hematology ROS (+) Blood dyscrasia, anemia ,   Anesthesia Other Findings   Reproductive/Obstetrics negative OB ROS                           Stress Echo 08/01/15: Study Conclusions  - Stress ECG conclusions: There were no stress arrhythmias or conduction abnormalities. The stress ECG was negative for ischemia. - Staged echo: There was no echocardiographic evidence for stress-induced ischemia.  EKG  01/21/15: Sinus tachycardia Possible Left atrial enlargement Septal infarct , age undetermined T wave abnormality, consider inferolateral ischemia Abnormal ECG  BP Readings from Last 3 Encounters:  12/12/15 130/72  12/03/15 145/76  11/23/15 126/80   Lab Results  Component Value Date   WBC 6.5 06/29/2015   HGB 11.9* 10/10/2015   HCT 35.0* 10/10/2015   MCV 89.5 06/29/2015   PLT 239 06/29/2015     Chemistry      Component Value Date/Time   NA 138 10/10/2015 0653   K 5.0 10/10/2015 0653   CL 97* 10/10/2015 0653   CO2 21 06/29/2015 1620   BUN 26* 10/10/2015 0653   CREATININE 5.70* 10/10/2015 0653   CREATININE 9.61* 06/29/2015 1620   CREATININE 0.35* 10/23/2008 2300      Component Value Date/Time   CALCIUM 9.2 06/29/2015 1620   CALCIUM 8.9 10/10/2014 1931   ALKPHOS 103 06/29/2015 1620   AST 17 06/29/2015 1620   ALT 9 06/29/2015 1620   BILITOT 0.4 06/29/2015 1620     Lab Results  Component Value Date   HGBA1C 4.7 06/29/2015     Anesthesia Physical  Anesthesia Plan  ASA: III  Anesthesia Plan: MAC and General   Post-op Pain Management:    Induction: Intravenous  Airway Management Planned:   Additional Equipment:   Intra-op Plan:   Post-operative Plan:   Informed Consent: I have reviewed the patients History and Physical, chart, labs and discussed the procedure including the risks, benefits and alternatives for the proposed anesthesia with the patient or authorized representative who has indicated his/her understanding and acceptance.  Dental advisory given  Plan Discussed with: CRNA, Anesthesiologist and Surgeon  Anesthesia Plan Comments:       Anesthesia Quick Evaluation

## 2015-12-20 NOTE — Progress Notes (Signed)
Anesthesia Chart Review: SAME DAY WORK-UP.  Patient is a 55 year old female posted for ligation of LUE AVF, creation of right AVF, insertion of dialysis catheter on 12/21/15 by Dr. Trula Slade. She has had multiple other HD access procedures including insertion of right IJ catheter 10/05/15 and 02/05/15, LUE first stage BVT 02/14/15 with second stage 03/28/15 with revision 09/26/15.  History includes ESRD (HD TTS), former smoker, lupus, HTN, anemia, CVA 11/2014 (left sided weakness, dysphagia), HLD, MI (peak troponin 0.74 during 01/2015 admission for acute respiratory and renal failure, SVT), DM2 (borderline). PCP is listed as Dr. Janett Billow Copland. She denied being followed by a cardiologist. She had a stress echo in 07/2015, which I think was part of her Renal Transplant evaluation ([facility]).  Meds include Xanax, Sensipar, Plaquenil, Protonix, pravastatin, Lyrica, Revnela, temazepam, thiamine.  01/21/15 EKG: ST at 135, possible LAE, septal infarct (age undetermined), T wave abnormality, consider inferolateral ischemia. She had a more recent EKG with [facility] (Care Everywhere) with Result Narrative: NSR, non-specific T wave abnormality, lateral leads. Prolonged QT. (Tracing will not be faxed without a signed release.)  08/01/15 Stress echo: Study Conclusions - Stress ECG conclusions: There were no stress arrhythmias or conduction abnormalities. The stress ECG was negative for ischemia. - Staged echo: There was no echocardiographic evidence for stress-induced ischemia. Impressions: - Normal dobutamine echo. No evidence of ischemia.  01/21/15 Echo (done during admission for acute respiratory failure, acute on chronic renal failure, SVT, acute diastolic CHF, acute encephalopathy): Study Conclusions - Left ventricle: Wall thickness was increased in a pattern of mild LVH. Systolic function was mildly reduced. The estimated ejection fraction was in the range of 45% to 50%. There is severe hypokinesis of the  mid-apicalanteroseptal myocardium. - Aortic valve: There was moderate regurgitation. - Mitral valve: There was moderate regurgitation. Impressions: - Patient very tachycardic 138bpm.   10/09/15 Carotid doppler: Evidence of 60-79% stenosis of the bilateral internal carotid arteries, right worse than left. Evidence of greater than 50% stenosis of the bilateral external carotid arteries. Bilateral vertebral arteries antegrade. Left subclavian artery appears stenotic.  10/07/15 CXR: IMPRESSION: Stable chest x-ray. No acute findings. IJ dialysis catheter is stable in position.  She is for labs on arrival.   George [facility] Short Stay Center/Anesthesiology Phone [phone] 12/20/2015 1:14 PM

## 2015-12-21 ENCOUNTER — Ambulatory Visit (HOSPITAL_COMMUNITY): Payer: Managed Care, Other (non HMO)

## 2015-12-21 ENCOUNTER — Encounter (HOSPITAL_COMMUNITY)
Admission: RE | Disposition: A | Discharge status: Home / Self Care | Payer: Self-pay | Source: Ambulatory Visit | Attending: Surgery

## 2015-12-21 ENCOUNTER — Ambulatory Visit (HOSPITAL_COMMUNITY)
Admission: RE | Admit: 2015-12-21 | Discharge: 2015-12-21 | Disposition: A | Discharge status: Home / Self Care | Payer: Managed Care, Other (non HMO) | Source: Ambulatory Visit | Attending: Surgery | Admitting: Surgery

## 2015-12-21 ENCOUNTER — Encounter (HOSPITAL_COMMUNITY): Payer: Self-pay | Admitting: Certified Registered Nurse Anesthetist

## 2015-12-21 ENCOUNTER — Ambulatory Visit (HOSPITAL_COMMUNITY): Payer: Managed Care, Other (non HMO) | Admitting: Vascular Surgery

## 2015-12-21 DIAGNOSIS — I252 Old myocardial infarction: Secondary | ICD-10-CM | POA: Diagnosis not present

## 2015-12-21 DIAGNOSIS — T82898A Other specified complication of vascular prosthetic devices, implants and grafts, initial encounter: Secondary | ICD-10-CM | POA: Insufficient documentation

## 2015-12-21 DIAGNOSIS — F419 Anxiety disorder, unspecified: Secondary | ICD-10-CM | POA: Diagnosis not present

## 2015-12-21 DIAGNOSIS — N186 End stage renal disease: Secondary | ICD-10-CM | POA: Diagnosis not present

## 2015-12-21 DIAGNOSIS — Z9889 Other specified postprocedural states: Secondary | ICD-10-CM

## 2015-12-21 DIAGNOSIS — I5032 Chronic diastolic (congestive) heart failure: Secondary | ICD-10-CM | POA: Insufficient documentation

## 2015-12-21 DIAGNOSIS — I132 Hypertensive heart and chronic kidney disease with heart failure and with stage 5 chronic kidney disease, or end stage renal disease: Secondary | ICD-10-CM | POA: Insufficient documentation

## 2015-12-21 DIAGNOSIS — D649 Anemia, unspecified: Secondary | ICD-10-CM | POA: Diagnosis not present

## 2015-12-21 DIAGNOSIS — E785 Hyperlipidemia, unspecified: Secondary | ICD-10-CM | POA: Diagnosis not present

## 2015-12-21 DIAGNOSIS — M3214 Glomerular disease in systemic lupus erythematosus: Secondary | ICD-10-CM | POA: Insufficient documentation

## 2015-12-21 DIAGNOSIS — E1122 Type 2 diabetes mellitus with diabetic chronic kidney disease: Secondary | ICD-10-CM | POA: Insufficient documentation

## 2015-12-21 DIAGNOSIS — M069 Rheumatoid arthritis, unspecified: Secondary | ICD-10-CM | POA: Diagnosis not present

## 2015-12-21 DIAGNOSIS — T82898D Other specified complication of vascular prosthetic devices, implants and grafts, subsequent encounter: Secondary | ICD-10-CM

## 2015-12-21 DIAGNOSIS — Z87891 Personal history of nicotine dependence: Secondary | ICD-10-CM | POA: Insufficient documentation

## 2015-12-21 DIAGNOSIS — Y832 Surgical operation with anastomosis, bypass or graft as the cause of abnormal reaction of the patient, or of later complication, without mention of misadventure at the time of the procedure: Secondary | ICD-10-CM | POA: Diagnosis not present

## 2015-12-21 DIAGNOSIS — I69354 Hemiplegia and hemiparesis following cerebral infarction affecting left non-dominant side: Secondary | ICD-10-CM | POA: Diagnosis not present

## 2015-12-21 DIAGNOSIS — Z79899 Other long term (current) drug therapy: Secondary | ICD-10-CM | POA: Diagnosis not present

## 2015-12-21 DIAGNOSIS — Z992 Dependence on renal dialysis: Secondary | ICD-10-CM | POA: Insufficient documentation

## 2015-12-21 HISTORY — PX: INSERTION OF DIALYSIS CATHETER: SHX1324

## 2015-12-21 HISTORY — PX: AV FISTULA PLACEMENT: SHX1204

## 2015-12-21 HISTORY — PX: LIGATION OF ARTERIOVENOUS  FISTULA: SHX5948

## 2015-12-21 LAB — POCT I-STAT 4, (NA,K, GLUC, HGB,HCT)
GLUCOSE: 70 mg/dL (ref 65–99)
Glucose, Bld: 69 mg/dL (ref 65–99)
HCT: 49 % — ABNORMAL HIGH (ref 36.0–46.0)
HEMATOCRIT: 47 % — AB (ref 36.0–46.0)
HEMOGLOBIN: 16.7 g/dL — AB (ref 12.0–15.0)
HEMOGLOBIN: 16 g/dL — AB (ref 12.0–15.0)
POTASSIUM: 6.7 mmol/L — AB (ref 3.5–5.1)
Potassium: 4.9 mmol/L (ref 3.5–5.1)
SODIUM: 132 mmol/L — AB (ref 135–145)
Sodium: 139 mmol/L (ref 135–145)

## 2015-12-21 LAB — GLUCOSE, CAPILLARY
GLUCOSE-CAPILLARY: 62 mg/dL — AB (ref 65–99)
GLUCOSE-CAPILLARY: 63 mg/dL — AB (ref 65–99)
Glucose-Capillary: 74 mg/dL (ref 65–99)
Glucose-Capillary: 91 mg/dL (ref 65–99)

## 2015-12-21 SURGERY — LIGATION OF ARTERIOVENOUS  FISTULA
Anesthesia: Monitor Anesthesia Care | Site: Neck | Laterality: Right

## 2015-12-21 MED ORDER — MEPERIDINE HCL 25 MG/ML IJ SOLN: 6.2500 mg | INTRAMUSCULAR | Status: DC | PRN

## 2015-12-21 MED ORDER — HEPARIN SODIUM (PORCINE) 1000 UNIT/ML IJ SOLN
INTRAMUSCULAR | Status: DC | PRN
  Administered 2015-12-21: 3.8 mL

## 2015-12-21 MED ORDER — SODIUM CHLORIDE 0.9 % IJ SOLN
INTRAMUSCULAR | Status: AC
  Filled 2015-12-21: qty 10

## 2015-12-21 MED ORDER — PROMETHAZINE HCL 25 MG/ML IJ SOLN: 6.2500 mg | INTRAMUSCULAR | Status: DC | PRN

## 2015-12-21 MED ORDER — SUCCINYLCHOLINE CHLORIDE 20 MG/ML IJ SOLN
INTRAMUSCULAR | Status: AC
  Filled 2015-12-21: qty 1

## 2015-12-21 MED ORDER — HEMOSTATIC AGENTS (NO CHARGE) OPTIME
TOPICAL | Status: DC | PRN
  Administered 2015-12-21: 1 via TOPICAL

## 2015-12-21 MED ORDER — SODIUM CHLORIDE 0.9 % IV SOLN
10.0000 mg | INTRAVENOUS | Status: DC | PRN
  Administered 2015-12-21: 40 ug/min via INTRAVENOUS

## 2015-12-21 MED ORDER — HYDROCODONE-ACETAMINOPHEN 7.5-325 MG PO TABS: 1.0000 | ORAL_TABLET | Freq: Four times a day (QID) | ORAL | Status: DC | PRN

## 2015-12-21 MED ORDER — OXYCODONE HCL 5 MG/5ML PO SOLN: 5.0000 mg | Freq: Once | ORAL | Status: DC | PRN

## 2015-12-21 MED ORDER — OXYCODONE HCL 5 MG PO TABS: 5.0000 mg | ORAL_TABLET | Freq: Once | ORAL | Status: DC | PRN

## 2015-12-21 MED ORDER — PROPOFOL 10 MG/ML IV BOLUS
INTRAVENOUS | Status: DC | PRN
  Administered 2015-12-21: 150 mg via INTRAVENOUS

## 2015-12-21 MED ORDER — LIDOCAINE-EPINEPHRINE (PF) 1 %-1:200000 IJ SOLN
INTRAMUSCULAR | Status: AC
  Filled 2015-12-21: qty 30

## 2015-12-21 MED ORDER — SODIUM CHLORIDE 0.9 % IV SOLN
INTRAVENOUS | Status: DC | PRN
  Administered 2015-12-21: 500 mL

## 2015-12-21 MED ORDER — PHENYLEPHRINE 40 MCG/ML (10ML) SYRINGE FOR IV PUSH (FOR BLOOD PRESSURE SUPPORT)
PREFILLED_SYRINGE | INTRAVENOUS | Status: AC
  Filled 2015-12-21: qty 10

## 2015-12-21 MED ORDER — LIDOCAINE HCL (CARDIAC) 20 MG/ML IV SOLN
INTRAVENOUS | Status: DC | PRN
  Administered 2015-12-21: 50 mg via INTRAVENOUS

## 2015-12-21 MED ORDER — DEXTROSE 50 % IV SOLN
INTRAVENOUS | Status: AC
  Administered 2015-12-21: 25 mL
  Filled 2015-12-21: qty 50

## 2015-12-21 MED ORDER — ONDANSETRON HCL 4 MG/2ML IJ SOLN
INTRAMUSCULAR | Status: DC | PRN
  Administered 2015-12-21: 4 mg via INTRAVENOUS

## 2015-12-21 MED ORDER — EPHEDRINE SULFATE 50 MG/ML IJ SOLN
INTRAMUSCULAR | Status: AC
  Filled 2015-12-21: qty 1

## 2015-12-21 MED ORDER — PROPOFOL 10 MG/ML IV BOLUS
INTRAVENOUS | Status: AC
  Filled 2015-12-21: qty 20

## 2015-12-21 MED ORDER — MIDAZOLAM HCL 2 MG/2ML IJ SOLN
INTRAMUSCULAR | Status: AC
  Filled 2015-12-21: qty 2

## 2015-12-21 MED ORDER — FENTANYL CITRATE (PF) 100 MCG/2ML IJ SOLN
INTRAMUSCULAR | Status: DC | PRN
  Administered 2015-12-21 (×3): 25 ug via INTRAVENOUS

## 2015-12-21 MED ORDER — ROCURONIUM BROMIDE 50 MG/5ML IV SOLN
INTRAVENOUS | Status: AC
  Filled 2015-12-21: qty 1

## 2015-12-21 MED ORDER — HEPARIN SODIUM (PORCINE) 1000 UNIT/ML IJ SOLN
INTRAMUSCULAR | Status: AC
  Filled 2015-12-21: qty 1

## 2015-12-21 MED ORDER — LIDOCAINE HCL (CARDIAC) 20 MG/ML IV SOLN
INTRAVENOUS | Status: AC
  Filled 2015-12-21: qty 5

## 2015-12-21 MED ORDER — HYDROMORPHONE HCL 1 MG/ML IJ SOLN
0.2500 mg | INTRAMUSCULAR | Status: DC | PRN
  Administered 2015-12-21: 0.25 mg via INTRAVENOUS

## 2015-12-21 MED ORDER — 0.9 % SODIUM CHLORIDE (POUR BTL) OPTIME
TOPICAL | Status: DC | PRN
  Administered 2015-12-21: 1000 mL

## 2015-12-21 MED ORDER — ONDANSETRON HCL 4 MG/2ML IJ SOLN
INTRAMUSCULAR | Status: AC
  Filled 2015-12-21: qty 2

## 2015-12-21 MED ORDER — FENTANYL CITRATE (PF) 250 MCG/5ML IJ SOLN
INTRAMUSCULAR | Status: AC
  Filled 2015-12-21: qty 5

## 2015-12-21 MED ORDER — HYDROMORPHONE HCL 1 MG/ML IJ SOLN
INTRAMUSCULAR | Status: AC
  Filled 2015-12-21: qty 1

## 2015-12-21 SURGICAL SUPPLY — 60 items
ARMBAND PINK RESTRICT EXTREMIT (MISCELLANEOUS) ×5 IMPLANT
BAG DECANTER FOR FLEXI CONT (MISCELLANEOUS) ×5 IMPLANT
BIOPATCH RED 1 DISK 7.0 (GAUZE/BANDAGES/DRESSINGS) ×4 IMPLANT
BIOPATCH RED 1IN DISK 7.0MM (GAUZE/BANDAGES/DRESSINGS) ×1
CANISTER SUCTION 2500CC (MISCELLANEOUS) ×5 IMPLANT
CATH PALINDROME RT-P 15FX19CM (CATHETERS) IMPLANT
CATH PALINDROME RT-P 15FX23CM (CATHETERS) IMPLANT
CATH PALINDROME RT-P 15FX28CM (CATHETERS) ×2 IMPLANT
CATH PALINDROME RT-P 15FX55CM (CATHETERS) IMPLANT
CLIP TI MEDIUM 6 (CLIP) ×5 IMPLANT
CLIP TI WIDE RED SMALL 6 (CLIP) ×5 IMPLANT
COVER PROBE W GEL 5X96 (DRAPES) ×5 IMPLANT
DRAPE C-ARM 42X72 X-RAY (DRAPES) ×5 IMPLANT
DRAPE CHEST BREAST 15X10 FENES (DRAPES) ×5 IMPLANT
ELECT REM PT RETURN 9FT ADLT (ELECTROSURGICAL) ×5
ELECTRODE REM PT RTRN 9FT ADLT (ELECTROSURGICAL) ×3 IMPLANT
GAUZE SPONGE 2X2 8PLY STRL LF (GAUZE/BANDAGES/DRESSINGS) IMPLANT
GAUZE SPONGE 4X4 16PLY XRAY LF (GAUZE/BANDAGES/DRESSINGS) ×5 IMPLANT
GEL ULTRASOUND 20GR AQUASONIC (MISCELLANEOUS) IMPLANT
GLOVE BIOGEL PI IND STRL 7.5 (GLOVE) ×3 IMPLANT
GLOVE BIOGEL PI INDICATOR 7.5 (GLOVE) ×4
GLOVE ECLIPSE 7.0 STRL STRAW (GLOVE) ×2 IMPLANT
GLOVE SURG SS PI 7.0 STRL IVOR (GLOVE) ×2 IMPLANT
GLOVE SURG SS PI 7.5 STRL IVOR (GLOVE) ×7 IMPLANT
GOWN STRL REUS W/ TWL LRG LVL3 (GOWN DISPOSABLE) ×6 IMPLANT
GOWN STRL REUS W/ TWL XL LVL3 (GOWN DISPOSABLE) ×3 IMPLANT
GOWN STRL REUS W/TWL LRG LVL3 (GOWN DISPOSABLE) ×10
GOWN STRL REUS W/TWL XL LVL3 (GOWN DISPOSABLE) ×5
HEMOSTAT SNOW SURGICEL 2X4 (HEMOSTASIS) IMPLANT
KIT BASIN OR (CUSTOM PROCEDURE TRAY) ×5 IMPLANT
KIT ROOM TURNOVER OR (KITS) ×5 IMPLANT
LIQUID BAND (GAUZE/BANDAGES/DRESSINGS) ×11 IMPLANT
NDL 18GX1X1/2 (RX/OR ONLY) (NEEDLE) ×3 IMPLANT
NDL HYPO 25GX1X1/2 BEV (NEEDLE) ×3 IMPLANT
NEEDLE 18GX1X1/2 (RX/OR ONLY) (NEEDLE) ×10 IMPLANT
NEEDLE HYPO 25GX1X1/2 BEV (NEEDLE) ×5 IMPLANT
NS IRRIG 1000ML POUR BTL (IV SOLUTION) ×5 IMPLANT
PACK CV ACCESS (CUSTOM PROCEDURE TRAY) ×5 IMPLANT
PACK SURGICAL SETUP 50X90 (CUSTOM PROCEDURE TRAY) ×5 IMPLANT
PAD ARMBOARD 7.5X6 YLW CONV (MISCELLANEOUS) ×10 IMPLANT
SOAP 2 % CHG 4 OZ (WOUND CARE) ×5 IMPLANT
SPONGE GAUZE 2X2 STER 10/PKG (GAUZE/BANDAGES/DRESSINGS) ×2
SUT ETHILON 3 0 PS 1 (SUTURE) ×5 IMPLANT
SUT PROLENE 5 0 C 1 24 (SUTURE) ×4 IMPLANT
SUT PROLENE 6 0 BV (SUTURE) ×2 IMPLANT
SUT PROLENE 6 0 CC (SUTURE) ×5 IMPLANT
SUT PROLENE 7 0 BV1 MDA (SUTURE) ×2 IMPLANT
SUT SILK 0 TIES 10X30 (SUTURE) ×5 IMPLANT
SUT VIC AB 3-0 SH 27 (SUTURE) ×15
SUT VIC AB 3-0 SH 27X BRD (SUTURE) ×3 IMPLANT
SUT VICRYL 4-0 PS2 18IN ABS (SUTURE) ×5 IMPLANT
SWAB COLLECTION DEVICE MRSA (MISCELLANEOUS) IMPLANT
SYR 20CC LL (SYRINGE) ×10 IMPLANT
SYR 5ML LL (SYRINGE) ×5 IMPLANT
SYR CONTROL 10ML LL (SYRINGE) ×5 IMPLANT
SYRINGE 10CC LL (SYRINGE) ×7 IMPLANT
TAPE CLOTH SURG 4X10 WHT LF (GAUZE/BANDAGES/DRESSINGS) ×2 IMPLANT
TUBE ANAEROBIC SPECIMEN COL (MISCELLANEOUS) IMPLANT
UNDERPAD 30X30 INCONTINENT (UNDERPADS AND DIAPERS) ×5 IMPLANT
WATER STERILE IRR 1000ML POUR (IV SOLUTION) ×5 IMPLANT

## 2015-12-21 NOTE — Transfer of Care (Signed)
Immediate Anesthesia Transfer of Care Note  Patient: [name] [name]  Procedure(s) Performed: Procedure(s): LIGATION OF ARTERIOVENOUS  FISTULA LEFT ARM (Left) ARTERIOVENOUS (AV) FISTULA CREATION RIGHT ARM (Right) INSERTION OF DIALYSIS CATHETER LEFT INTERNAL JUGULAR VEIN (Left)  Patient Location: PACU  Anesthesia Type:General  Level of Consciousness: awake, alert , oriented and patient cooperative  Airway & Oxygen Therapy: Patient Spontanous Breathing and Patient connected to nasal cannula oxygen  Post-op Assessment: Report given to RN, Post -op Vital signs reviewed and stable and Patient moving all extremities X 4  Post vital signs: Reviewed and stable  Last Vitals:  Filed Vitals:   12/21/15 0704  BP: 136/58  Pulse: 87  Temp: 123XX123 C    Complications: No apparent anesthesia complications

## 2015-12-21 NOTE — H&P (View-Only) (Signed)
Established Dialysis Access  History of Present Illness  [name] [name] is a 55 y.o. (October 13, 1961) female patient Dr. Dr. Donnetta Hutching, Dr. Trula Slade, and Dr. Bridgett Larsson.  She returns today at the request of personnel at her dialysis center for evaluation of her left upper arm AVF. Pt states she can tolerate only 2.5 hours of HD at her left forearm AVF and has to stop HD due to severe pain in her left hand. Her right side chest tunneled HD catheter was accidentally removed by her grandson about 2 weeks ago; pt states she was seen in [facility] ED for this.  On 02/14/2015 she had a first stage basilic vein transposition by Dr. Donnetta Hutching. On 05/16/2015 she had a second stage procedure by Dr. Donnetta Hutching. On 09/26/2015 she had banding of the proximal anastomosis by Dr. [name] for steal syndrome. At some point in time, she has had a stent placed within her basilic vein by the renal access center. Dr. Trula Slade studied her on 12 7 and found the stent to be widely patent. There was some residual stenosis proximal to the stent which was dilated with the 8 mm balloon. She continues to have difficulty with cannulation , and in fact she still has a catheter in place as a backup if they can't cannulate her fistula. She also states that her hand is very numb and cold particulary after dialysis. The pt states she has had tingling in her left hand since her stroke. Dr. Trula Slade last saw pt on 11/09/15. At that time patient was scheduled for access intervention by the renal access center the following week. Dr. Trula Slade suspected that she was going to need to have this fistula ligated with plans for new access. At that time they had difficulty cannulating it and therefore have not gotten rid of her catheter. Makes no sense to have a fistula that is marginally functional and a catheter simultaneously. In addition she complained of pain and numbness in her left hand which most likely is a steal syndrome. She did not get any benefit from the  banding procedure. Dr. Trula Slade told the patient to contact us after she has her procedure the following week and we will determine what the next step is. She is reluctant to have a access placed in a right arms and she is right-handed. She dialyzes T-TH-SAT.  She has not had previous access procedures.   Past Medical History  Diagnosis Date  . Hypertension   . Lupus ([place])   . CKD (chronic kidney disease)     due to lupus/Dr. [name]  . Stenosis of cervical spine region     with HNP at C5/6, C6/7  . Metatarsal bone fracture right    4th  . 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 Arthritis     RA  . Anemia   . Stroke ([place]) 11/2014    left sided weakness, dysphagia  . Pain in joints   . Paresthesias   . Hyperlipidemia   . Colitis     ? per hospital notes 11/2014  . Lower GI bleed   . Hyponatremia   . Metabolic acidosis   . Hyperplastic colon polyp   . Hemorrhoids   . Blood transfusion without reported diagnosis   . Myocardial infarction ([place])   . Diabetes mellitus ([place])     border line    Social History Social History  Substance Use Topics  .  Smoking status: Former Smoker -- 0.50 packs/day for 30 years    Types: Cigarettes    Quit date: 09/22/2014  . Smokeless tobacco: Never Used  . Alcohol Use: No    Family History Family History  Problem Relation Age of Onset  . Hypertension    . Lupus    . Rheum arthritis    . Hypertension Mother   . Diabetes Mother   . Hypertension Sister   . Diabetes Father   . Hypertension Maternal Grandmother   . Colon cancer Neg Hx     Surgical History Past Surgical History  Procedure Laterality Date  . Breast biopsy    . Tubal ligation    . Insertion of dialysis catheter Right 02/05/2015    Procedure: INSERTION OF DIALYSIS CATHETER - RIGHT INTERNAL JUGULAR;  Surgeon: Conrad , MD;  Location: [place];  Service: Vascular;  Laterality: Right;  . Bascilic vein transposition Left  02/14/2015    Procedure: BASILIC VEIN Fistula Creation First Stage;  Surgeon: Rosetta Posner, MD;  Location: [place];  Service: Vascular;  Laterality: Left;  . Colonoscopy w/ biopsies and polypectomy    . Bascilic vein transposition Left 03/28/2015    Procedure: LEFT ARM SECOND STAGE BASCILIC VEIN TRANSPOSITION;  Surgeon: Rosetta Posner, MD;  Location: [place];  Service: Vascular;  Laterality: Left;  . Revison of arteriovenous fistula Left 09/26/2015    Procedure: Revision Left Basilic Vein Transposition with Banding using Gortex graft;  Surgeon: Mal Misty, MD;  Location: [place];  Service: Vascular;  Laterality: Left;  . Insertion of dialysis catheter N/A 10/05/2015    Procedure: INSERTION OF DIALYSIS CATHETER Right Internal Jugular.;  Surgeon: Mal Misty, MD;  Location: [place];  Service: Vascular;  Laterality: N/A;  . Peripheral vascular catheterization N/A 10/10/2015    Procedure: Fistulagram;  Surgeon: [name], MD;  Location: [place] [facility];  Service: Cardiovascular;  Laterality: N/A;    Allergies  Allergen Reactions  . Sulfa Antibiotics Itching    Current Outpatient Prescriptions  Medication Sig Dispense Refill  . acetaminophen (TYLENOL) 325 MG tablet Take 2 tablets (650 mg total) by mouth every 4 (four) hours as needed for mild pain.    Marland Kitchen ALPRAZolam (XANAX) 0.25 MG tablet Take 1 tablet (0.25 mg total) by mouth every 8 (eight) hours as needed for anxiety (0.28m-0.5mg). 60 tablet 0  . cinacalcet (SENSIPAR) 30 MG tablet Take 30 mg by mouth daily.    .Marland Kitchendocusate sodium (COLACE) 50 MG capsule Take 50 mg by mouth 2 (two) times daily.    .Marland KitchenHYDROcodone-acetaminophen (NORCO) 7.5-325 MG tablet Take 1 tablet by mouth every 6 (six) hours as needed for severe pain. 30 tablet 0  . hydroxychloroquine (PLAQUENIL) 200 MG tablet Take 1 tablet (200 mg total) by mouth 2 (two) times daily. 60 tablet 1  . lidocaine-prilocaine (EMLA) cream Apply 1 application topically as needed.    . multivitamin  (RENA-VIT) TABS tablet Take 1 tablet by mouth daily.    . pantoprazole (PROTONIX) 40 MG tablet Take 1 tablet (40 mg total) by mouth daily. 30 tablet 11  . pravastatin (PRAVACHOL) 20 MG tablet Take 20 mg by mouth daily.    . pregabalin (LYRICA) 75 MG capsule Take 1 capsule (75 mg total) by mouth daily. 30 capsule 6  . RENVELA 2.4 G PACK Take 2.4-4.8 g by mouth 3 (three) times daily with meals. 2 PACKS WITH MEALS 3 TIMES DAILY, 1 PACK WITH SNACKS    .  SUPREP BOWEL PREP SOLN Take 1 kit by mouth once. 354 mL 0  . temazepam (RESTORIL) 30 MG capsule Take 30 mg by mouth at bedtime as needed for sleep.    Marland Kitchen thiamine 100 MG tablet Take 1 tablet (100 mg total) by mouth daily. 30 tablet 1   No current facility-administered medications for this visit.     REVIEW OF SYSTEMS: see HPI for pertinent positives and negatives    PHYSICAL EXAMINATION:  Filed Vitals:   12/03/15 0952 12/03/15 0956  BP: 147/81 145/76  Pulse: 91   Temp: 98.8 F (37.1 C)   TempSrc: Oral   Height: 5' 4.5" (1.638 m)   Weight: 137 lb (62.143 kg)   SpO2: 97%    Body mass index is 23.16 kg/(m^2).  General: The patient appears their stated age.   HEENT:  No gross abnormalities Pulmonary: Respirations are non-labored Abdomen: Soft and non-tender. Musculoskeletal: There are no major deformities.   Neurologic: No focal weakness or paresthesias are detected. Right hand grip is 5/5, left is 4/5 Skin: There are no ulcer or rashes. Psychiatric: The patient has normal affect. Cardiovascular: There is a regular rate and rhythm. Left radial pulse is not palpable. Left upper arm AVF has a palpable thrill and audible bruit at the distal portion, the mid and proximal portions have a palpable pulse, do not seem to have a thrill or bruit.    Medical Decision Making  [name] [name] is a 55 y.o. female who presents with ESRD requiring hemodialysis.  I discussed this with Dr. Trula Slade. We discussed whether she wants to try to continue  HD with this left upper arm AVF as she cannot complete her HD due to severe pain in her left hand, and tingling in her left hand all of the time. Pt states she wants to try dialyzing 4 days/week instead of 3 days/week before scheduling the below procedures, states she will speak with Dr. Jimmy Footman about this.Pt states she willl call and let us know if she changes her mind. Option left open to pt for another HD access, have the current AVF ligated, and have a temporary tunneled dialysis catheter.  Follow up with [place] as needed.   NICKEL, Sharmon Leyden, RN, MSN, FNP-C [facility] [place] [facility] Office: [phone]  12/03/2015, 10:04 AM  Clinic MD: Trula Slade

## 2015-12-21 NOTE — Progress Notes (Signed)
Per Dr. Trula Slade place IV in left side below elbow.   Spoke with Dr. Lissa Hoard re glucose 69, patient given 1/2 amp D50 per order.

## 2015-12-21 NOTE — Anesthesia Postprocedure Evaluation (Signed)
Anesthesia Post Note  Patient: [name] [name]  Procedure(s) Performed: Procedure(s) (LRB): LIGATION OF ARTERIOVENOUS  FISTULA LEFT ARM (Left) ARTERIOVENOUS (AV) FISTULA CREATION RIGHT ARM (Right) INSERTION OF DIALYSIS CATHETER LEFT INTERNAL JUGULAR VEIN (Left)  Patient location during evaluation: PACU Anesthesia Type: General Level of consciousness: sedated and patient cooperative Pain management: pain level controlled Vital Signs Assessment: post-procedure vital signs reviewed and stable Respiratory status: spontaneous breathing Cardiovascular status: stable Anesthetic complications: no    Last Vitals:  Filed Vitals:   12/21/15 1311 12/21/15 1330  BP: 128/51 105/45  Pulse: 96 95  Temp:    Resp: 14 11    Last Pain:  Filed Vitals:   12/21/15 1403  PainSc: [place]

## 2015-12-21 NOTE — Interval H&P Note (Signed)
History and Physical Interval Note:  12/21/2015 8:41 AM  [name] [name]  has presented today for surgery, with the diagnosis of End stage renal disease  The various methods of treatment have been discussed with the patient and family. After consideration of risks, benefits and other options for treatment, the patient has consented to  Procedure(s): LIGATION OF ARTERIOVENOUS  FISTULA (Left) ARTERIOVENOUS (AV) FISTULA CREATION (Right) INSERTION OF DIALYSIS CATHETER (N/A) as a surgical intervention .  The patient's history has been reviewed, patient examined, no change in status, stable for surgery.  I have reviewed the patient's chart and labs.  Questions were answered to the patient's satisfaction.     [name]  CC: Hand is cold PE:CV:RRR PULM:CTA NEURO:INTACT Plan right AVF, ligate Left AVF, Catheter.  All questions answered  Discussed right BCF vs 1st stage BVT.  Understands risk of non-maturity.  Also discussed placing a graft if veins not adequate.  WB

## 2015-12-21 NOTE — Anesthesia Procedure Notes (Signed)
Procedure Name: LMA Insertion Date/Time: 12/21/2015 9:13 AM Performed by: Carney Living Pre-anesthesia Checklist: Patient identified, Suction available, Emergency Drugs available, Patient being monitored and Timeout performed Patient Re-evaluated:Patient Re-evaluated prior to inductionOxygen Delivery Method: Circle system utilized Preoxygenation: Pre-oxygenation with 100% oxygen Intubation Type: IV induction LMA: LMA inserted LMA Size: 4.0 Number of attempts: 1 Placement Confirmation: positive ETCO2 and breath sounds checked- equal and bilateral Tube secured with: Tape Dental Injury: Teeth and Oropharynx as per pre-operative assessment

## 2015-12-24 ENCOUNTER — Encounter (HOSPITAL_COMMUNITY): Payer: Self-pay | Admitting: Surgery

## 2015-12-26 ENCOUNTER — Telehealth: Payer: Self-pay

## 2015-12-26 ENCOUNTER — Telehealth: Payer: Self-pay | Admitting: Surgery

## 2015-12-26 DIAGNOSIS — R29898 Other symptoms and signs involving the musculoskeletal system: Secondary | ICD-10-CM

## 2015-12-26 DIAGNOSIS — R200 Anesthesia of skin: Secondary | ICD-10-CM

## 2015-12-26 DIAGNOSIS — R202 Paresthesia of skin: Principal | ICD-10-CM

## 2015-12-26 NOTE — Telephone Encounter (Signed)
[facility]/ [name] to give [facility]t. Sched appt w/ NP on 12/28/15 @ 9:15. Add on a study or use the portable u/s machine to eval steal syndrome.

## 2015-12-26 NOTE — Telephone Encounter (Signed)
-----   Message from Mena Goes, RN sent at 12/21/2015 12:40 PM EST ----- Regarding: schedule   ----- Message -----    From: Ulyses Amor, PA-C    Sent: 12/21/2015  12:23 PM      To: [facility] Charge Pool  F/U with Dr. Trula Slade 6 weeks s/p ligation of fistula creation of fistula and diatek catheter placement.

## 2015-12-26 NOTE — Telephone Encounter (Signed)
Attempted to return call to pt. Re: c/o numbness in fingers of right hand; left voice message on pt's phone.  Attempted to contact pt. At the [facility]; spoke with Melissa; pt. Has not arrive as of 11:55 AM.  Will call back later.

## 2015-12-26 NOTE — Telephone Encounter (Signed)
LM for pt re appt, dpm °

## 2015-12-26 NOTE — Op Note (Signed)
Patient name: [name] [name] MRN: [id number] DOB: 1961-05-12 Sex: female  12/21/2015 Pre-operative Diagnosis: Left arm steal, ESRD  Post-operative diagnosis:  Same Surgeon:  [name] Assistants:  S. Rhyne Procedure:   #1.)  Ligation of left upper arm fistula   #2.)  Ultrasound guided placement of left IJ tunneled dialysis catheter   #3.)  1st stage right basilic vein fistula  Anesthesia:  Gen Blood Loss:  See anesthesia record Specimens:  none  Findings:  Small right cephalic vein, adequate bascilic  Indications:  The patient has had banding of her left arm fistula without improvement in her steal symptoms  Procedure:  The patient was identified in the holding area and taken to [place] [address]  The patient was then placed supine on the table. general anesthesia was administered.  The patient was prepped and draped in the usual sterile fashion.  A time out was called and antibiotics were administered.  A incision was made at the arm crease on the left.  The bascilic vein was dissected free.  I removed the previously placed gortex band and then divided the basilic vein, oversewing each end with 2 layers of 5-0 prolene.  The incision was closed with 2 layers of vicryl and dermabond.  The patient's entire neck and chest were prepped and draped in usual sterile fashion. The patient was placed in Trendelenburg position. Ultrasound was used to identify the patient's left internal jugular vein. This had normal compressibility and respiratory variation. Local anesthesia was infiltrated over the left jugular vein.  Using ultrasound guidance, the left internal jugular vein was successfully cannulated.  A 0.035 J-tipped guidewire was threaded into the left internal jugular vein and into the superior vena cava followed by the inferior vena cava under fluoroscopic guidance.   Next sequential 12 and 14 dilators were placed over the guidewire into the right atrium.  A 16 French dilator with a peel-away  sheath was then placed over the guidewire into the right atrium.   The guidewire and dilator were removed. A 27 cm Pallindrome catheter was then placed through the peel away sheath into the right atrium.  The catheter was then tunneled subcutaneously, cut to length, and the hub attached. The catheter was noted to flush and draw easily. The catheter was inspected under fluoroscopy and found with its tip to be in the right atrium without any kinks throughout its course. The catheter was sutured to the skin with nylon sutures. The neck insertion site was closed with Vicryl stitch. The catheter was then loaded with concentrated Heparin solution. A dry sterile dressing was applied.  Next, I evaluated the right cephalic and basilic vein with u/s.  A transverse incision was made at the arm crease.  I dissected out the cephalic vein and felt it was too small.  I then isolated the bascilic vein. It was about 5mm.  It was fully mobilized, taking care to protect the nerve.  Side branches were ligated.  Then the brachial artery was dissected free.  The vein was then ligated distally.  It distended nicely with heparin saline.  The artery was occluded.  A longitudinal arteriotomy was crated with a #11 blade and extended with Potts scissors.  The vein was spatulated and sewn end to side with 6-0 prolene.  Appropriate flushing maneuvers were performed prior to completing the anastamosis.   The clamps were released.  There was a good thrill in the vein and good doppler signals in the radial and ulnar  artery at the wrist.  Hemostasis was achieved, and the incision was closed with 2 layers of vicryl and dermabond  The patient tolerated procedure well and there were no complications. Instrument sponge and needle counts correct in the case. The patient was taken to the recovery room in stable condition. Chest x-ray will be obtained in the recovery room.    Disposition:  To PACU in stable condition.   Theotis Burrow,  M.D. [facility] [place] Office: [phone] Pager:  [phone]

## 2015-12-26 NOTE — Telephone Encounter (Signed)
Able to contact the pt. @ [facility].  Spoke with receptionist, Melissa.   She asked pt. About her numbness in her fingers; reported the pt. c/o tingling in all fingers of both hands.  Reported she is able to grip with her right hand, but that her arm tires when she holds objects, and feels like it could give out on her.  Discussed with Dr. Scot Dock.  Recommended pt. to have a steal study of right upper extremity.   Will notify pt. of appt. for vascular study, and office appt. for further evaluation.

## 2015-12-27 ENCOUNTER — Encounter (HOSPITAL_COMMUNITY): Payer: Self-pay | Admitting: Emergency Medicine

## 2015-12-27 ENCOUNTER — Other Ambulatory Visit: Payer: Self-pay | Admitting: *Deleted

## 2015-12-27 ENCOUNTER — Inpatient Hospital Stay (HOSPITAL_COMMUNITY)
Admission: EM | Admit: 2015-12-27 | Discharge: 2016-01-01 | DRG: 391 | Disposition: A | Discharge status: Home / Self Care | Payer: Managed Care, Other (non HMO) | Attending: Internal Medicine | Admitting: Internal Medicine

## 2015-12-27 DIAGNOSIS — Z87891 Personal history of nicotine dependence: Secondary | ICD-10-CM

## 2015-12-27 DIAGNOSIS — Z8673 Personal history of transient ischemic attack (TIA), and cerebral infarction without residual deficits: Secondary | ICD-10-CM

## 2015-12-27 DIAGNOSIS — E1122 Type 2 diabetes mellitus with diabetic chronic kidney disease: Secondary | ICD-10-CM | POA: Diagnosis present

## 2015-12-27 DIAGNOSIS — Z8249 Family history of ischemic heart disease and other diseases of the circulatory system: Secondary | ICD-10-CM

## 2015-12-27 DIAGNOSIS — Z79899 Other long term (current) drug therapy: Secondary | ICD-10-CM

## 2015-12-27 DIAGNOSIS — E1140 Type 2 diabetes mellitus with diabetic neuropathy, unspecified: Secondary | ICD-10-CM | POA: Diagnosis present

## 2015-12-27 DIAGNOSIS — Z6823 Body mass index (BMI) 23.0-23.9, adult: Secondary | ICD-10-CM

## 2015-12-27 DIAGNOSIS — IMO0002 Reserved for concepts with insufficient information to code with codable children: Secondary | ICD-10-CM | POA: Diagnosis present

## 2015-12-27 DIAGNOSIS — Z882 Allergy status to sulfonamides status: Secondary | ICD-10-CM

## 2015-12-27 DIAGNOSIS — E43 Unspecified severe protein-calorie malnutrition: Secondary | ICD-10-CM | POA: Diagnosis present

## 2015-12-27 DIAGNOSIS — Z833 Family history of diabetes mellitus: Secondary | ICD-10-CM

## 2015-12-27 DIAGNOSIS — I959 Hypotension, unspecified: Secondary | ICD-10-CM | POA: Diagnosis present

## 2015-12-27 DIAGNOSIS — E785 Hyperlipidemia, unspecified: Secondary | ICD-10-CM | POA: Diagnosis present

## 2015-12-27 DIAGNOSIS — R0602 Shortness of breath: Secondary | ICD-10-CM

## 2015-12-27 DIAGNOSIS — E875 Hyperkalemia: Secondary | ICD-10-CM | POA: Diagnosis not present

## 2015-12-27 DIAGNOSIS — M329 Systemic lupus erythematosus, unspecified: Secondary | ICD-10-CM | POA: Diagnosis present

## 2015-12-27 DIAGNOSIS — R935 Abnormal findings on diagnostic imaging of other abdominal regions, including retroperitoneum: Secondary | ICD-10-CM | POA: Insufficient documentation

## 2015-12-27 DIAGNOSIS — R9431 Abnormal electrocardiogram [ECG] [EKG]: Secondary | ICD-10-CM | POA: Diagnosis present

## 2015-12-27 DIAGNOSIS — R1013 Epigastric pain: Secondary | ICD-10-CM | POA: Diagnosis present

## 2015-12-27 DIAGNOSIS — R197 Diarrhea, unspecified: Secondary | ICD-10-CM | POA: Diagnosis present

## 2015-12-27 DIAGNOSIS — K219 Gastro-esophageal reflux disease without esophagitis: Secondary | ICD-10-CM | POA: Diagnosis present

## 2015-12-27 DIAGNOSIS — R000 Tachycardia, unspecified: Secondary | ICD-10-CM | POA: Diagnosis present

## 2015-12-27 DIAGNOSIS — B349 Viral infection, unspecified: Secondary | ICD-10-CM | POA: Diagnosis not present

## 2015-12-27 DIAGNOSIS — K838 Other specified diseases of biliary tract: Secondary | ICD-10-CM | POA: Insufficient documentation

## 2015-12-27 DIAGNOSIS — R1011 Right upper quadrant pain: Secondary | ICD-10-CM | POA: Diagnosis not present

## 2015-12-27 DIAGNOSIS — I4581 Long QT syndrome: Secondary | ICD-10-CM | POA: Diagnosis present

## 2015-12-27 DIAGNOSIS — Z992 Dependence on renal dialysis: Secondary | ICD-10-CM

## 2015-12-27 DIAGNOSIS — M899 Disorder of bone, unspecified: Secondary | ICD-10-CM | POA: Diagnosis present

## 2015-12-27 DIAGNOSIS — N186 End stage renal disease: Secondary | ICD-10-CM

## 2015-12-27 DIAGNOSIS — I132 Hypertensive heart and chronic kidney disease with heart failure and with stage 5 chronic kidney disease, or end stage renal disease: Secondary | ICD-10-CM | POA: Diagnosis present

## 2015-12-27 DIAGNOSIS — Z4931 Encounter for adequacy testing for hemodialysis: Secondary | ICD-10-CM

## 2015-12-27 DIAGNOSIS — M069 Rheumatoid arthritis, unspecified: Secondary | ICD-10-CM | POA: Diagnosis present

## 2015-12-27 DIAGNOSIS — I252 Old myocardial infarction: Secondary | ICD-10-CM

## 2015-12-27 DIAGNOSIS — R109 Unspecified abdominal pain: Secondary | ICD-10-CM

## 2015-12-27 DIAGNOSIS — I5030 Unspecified diastolic (congestive) heart failure: Secondary | ICD-10-CM | POA: Diagnosis present

## 2015-12-27 DIAGNOSIS — N2581 Secondary hyperparathyroidism of renal origin: Secondary | ICD-10-CM | POA: Diagnosis present

## 2015-12-27 LAB — COMPREHENSIVE METABOLIC PANEL
ALBUMIN: 3.7 g/dL (ref 3.5–5.0)
ALK PHOS: 104 U/L (ref 38–126)
ALT: 15 U/L (ref 14–54)
AST: 25 U/L (ref 15–41)
Anion gap: 22 — ABNORMAL HIGH (ref 5–15)
BUN: 29 mg/dL — ABNORMAL HIGH (ref 6–20)
CALCIUM: 10.3 mg/dL (ref 8.9–10.3)
CHLORIDE: 93 mmol/L — AB (ref 101–111)
CO2: 21 mmol/L — AB (ref 22–32)
CREATININE: 6.71 mg/dL — AB (ref 0.44–1.00)
GFR calc Af Amer: 7 mL/min — ABNORMAL LOW (ref >60)
GFR calc non Af Amer: 6 mL/min — ABNORMAL LOW (ref >60)
GLUCOSE: 164 mg/dL — AB (ref 65–99)
Potassium: 5.1 mmol/L (ref 3.5–5.1)
SODIUM: 136 mmol/L (ref 135–145)
Total Bilirubin: 1.1 mg/dL (ref 0.3–1.2)
Total Protein: 10.3 g/dL — ABNORMAL HIGH (ref 6.5–8.1)

## 2015-12-27 LAB — I-STAT TROPONIN, ED: TROPONIN I, POC: 0.03 ng/mL (ref 0.00–0.08)

## 2015-12-27 LAB — CBC
HCT: 44.8 % (ref 36.0–46.0)
Hemoglobin: 14.3 g/dL (ref 12.0–15.0)
MCH: 27.3 pg (ref 26.0–34.0)
MCHC: 31.9 g/dL (ref 30.0–36.0)
MCV: 85.7 fL (ref 78.0–100.0)
PLATELETS: 195 10*3/uL (ref 150–400)
RBC: 5.23 MIL/uL — ABNORMAL HIGH (ref 3.87–5.11)
RDW: 17.5 % — ABNORMAL HIGH (ref 11.5–15.5)
WBC: 8.7 10*3/uL (ref 4.0–10.5)

## 2015-12-27 LAB — LIPASE, BLOOD: LIPASE: 34 U/L (ref 11–51)

## 2015-12-27 MED ORDER — MORPHINE SULFATE (PF) 4 MG/ML IV SOLN
4.0000 mg | Freq: Once | INTRAVENOUS | Status: AC
  Administered 2015-12-27: 4 mg via INTRAVENOUS
  Filled 2015-12-27: qty 1

## 2015-12-27 MED ORDER — SODIUM CHLORIDE 0.9 % IV BOLUS (SEPSIS)
1000.0000 mL | Freq: Once | INTRAVENOUS | Status: AC
  Administered 2015-12-27: 1000 mL via INTRAVENOUS

## 2015-12-27 MED ORDER — ONDANSETRON HCL 4 MG/2ML IJ SOLN
4.0000 mg | Freq: Once | INTRAMUSCULAR | Status: AC
  Administered 2015-12-27: 4 mg via INTRAVENOUS
  Filled 2015-12-27: qty 2

## 2015-12-27 MED ORDER — BARIUM SULFATE 2.1 % PO SUSP
ORAL | Status: AC
  Filled 2015-12-27: qty 2

## 2015-12-27 NOTE — ED Notes (Signed)
MD at bedside. 

## 2015-12-27 NOTE — ED Provider Notes (Signed)
CSN: [id number]     Arrival date & time 12/27/15  2133 History  By signing my name below, I, Evelene Croon, attest that this documentation has been prepared under the direction and in the presence of Julianne Rice, MD . Electronically Signed: Evelene Croon, Scribe. 12/27/2015. 11:57 PM.    Chief Complaint  Patient presents with  . Abdominal Pain  . Diarrhea   The history is provided by the patient. No language interpreter was used.    HPI Comments:  [name] [name] is a 55 y.o. female with a history of DM, colitis, and CKD, who presents to the Emergency Department complaining of constant upper abdominal pain that radiates around to back since waking this AM. She denies h/o similar pain. Pt reports associated nausea and vomiting, ~3-4 episodes. She also notes diarrhea which began after using a stool softener secondary to constipation. No alleviating factors noted. She also denies h/o abdominal surgery.  Pt notes she has had consecutive dialysis sessions for the last 3 days to draw off extra fluid as she was unable to have enough fluid removed  due to pain.   Past Medical History  Diagnosis Date  . Hypertension   . Lupus ([place])   . CKD (chronic kidney disease)     due to lupus/Dr. [name]  . Stenosis of cervical spine region     with HNP at C5/6, C6/7  . Metatarsal bone fracture right    4th  . 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 Arthritis     RA  . Anemia   . Stroke ([place]) 11/2014    left sided weakness, dysphagia  . Pain in joints   . Paresthesias   . Hyperlipidemia   . Colitis     ? per hospital notes 11/2014  . Lower GI bleed   . Hyponatremia   . Metabolic acidosis   . Hyperplastic colon polyp   . Hemorrhoids   . Blood transfusion without reported diagnosis   . Myocardial infarction ([place])   . Diabetes mellitus ([place])     border line   Past Surgical History  Procedure Laterality Date  . Breast biopsy    . Tubal ligation     . Insertion of dialysis catheter Right 02/05/2015    Procedure: INSERTION OF DIALYSIS CATHETER - RIGHT INTERNAL JUGULAR;  Surgeon: Conrad Centralia, MD;  Location: [facility];  Service: Vascular;  Laterality: Right;  . Bascilic vein transposition Left 02/14/2015    Procedure: BASILIC VEIN Fistula Creation First Stage;  Surgeon: Rosetta Posner, MD;  Location: [facility];  Service: Vascular;  Laterality: Left;  . Colonoscopy w/ biopsies and polypectomy    . Bascilic vein transposition Left 03/28/2015    Procedure: LEFT ARM SECOND STAGE BASCILIC VEIN TRANSPOSITION;  Surgeon: Rosetta Posner, MD;  Location: [facility];  Service: Vascular;  Laterality: Left;  . Revison of arteriovenous fistula Left 09/26/2015    Procedure: Revision Left Basilic Vein Transposition with Banding using Gortex graft;  Surgeon: Mal Misty, MD;  Location: [facility];  Service: Vascular;  Laterality: Left;  . Insertion of dialysis catheter N/A 10/05/2015    Procedure: INSERTION OF DIALYSIS CATHETER Right Internal Jugular.;  Surgeon: Mal Misty, MD;  Location: [facility];  Service: Vascular;  Laterality: N/A;  . Peripheral vascular catheterization N/A 10/10/2015    Procedure: Fistulagram;  Surgeon: [name], MD;  Location: [facility];  Service: Cardiovascular;  Laterality: N/A;  . Ligation of arteriovenous  fistula Left 12/21/2015    Procedure: LIGATION OF ARTERIOVENOUS  FISTULA LEFT ARM;  Surgeon: [name], MD;  Location: [facility];  Service: Vascular;  Laterality: Left;  . Av fistula placement Right 12/21/2015    Procedure: ARTERIOVENOUS (AV) FISTULA CREATION RIGHT ARM;  Surgeon: [name], MD;  Location: [facility];  Service: Vascular;  Laterality: Right;  . Insertion of dialysis catheter Left 12/21/2015    Procedure: INSERTION OF DIALYSIS CATHETER LEFT INTERNAL JUGULAR VEIN;  Surgeon: [name], MD;  Location[facility];  Service: Vascular;  Laterality: Left;   Family History  Problem Relation Age of Onset  . Hypertension    .  Lupus    . Rheum arthritis    . Hypertension Mother   . Diabetes Mother   . Hypertension Sister   . Diabetes Father   . Hypertension Maternal Grandmother   . Colon cancer Neg Hx    Social History  Substance Use Topics  . Smoking status: Former Smoker -- 0.50 packs/day for 30 years    Types: Cigarettes    Quit date: 09/22/2014  . Smokeless tobacco: Never Used  . Alcohol Use: No   OB History    No data available     Review of Systems  Constitutional: Negative for fever and chills.  Respiratory: Negative for shortness of breath.   Cardiovascular: Negative for chest pain.  Gastrointestinal: Positive for nausea, vomiting, abdominal pain, diarrhea and constipation. Negative for blood in stool and abdominal distention.  Genitourinary: Negative for flank pain.  Musculoskeletal: Positive for back pain. Negative for neck pain and neck stiffness.  Skin: Negative for rash and wound.  Neurological: Negative for dizziness, weakness, light-headedness, numbness and headaches.  All other systems reviewed and are negative.  Allergies  Sulfa antibiotics  Home Medications   Prior to Admission medications   Medication Sig Start Date End Date Taking? Authorizing Provider  acetaminophen (TYLENOL) 325 MG tablet Take 2 tablets (650 mg total) by mouth every 4 (four) hours as needed for mild pain. 10/25/14  Yes Ivan Anchors Love, PA-C  cinacalcet (SENSIPAR) 30 MG tablet Take 30 mg by mouth daily.   Yes Historical Provider, MD  docusate sodium (COLACE) 50 MG capsule Take 50 mg by mouth 2 (two) times daily. Reported on 12/12/2015   Yes Historical Provider, MD  HYDROcodone-acetaminophen (NORCO) 7.5-325 MG tablet Take 1 tablet by mouth every 6 (six) hours as needed for severe pain. 12/21/15  Yes Ulyses Amor, PA-C  hydroxychloroquine (PLAQUENIL) 200 MG tablet Take 1 tablet (200 mg total) by mouth 2 (two) times daily. 07/02/15  Yes [name], MD  lidocaine-prilocaine (EMLA) cream Apply 1 application  topically as needed.   Yes Historical Provider, MD  multivitamin (RENA-VIT) TABS tablet Take 1 tablet by mouth daily.   Yes Historical Provider, MD  oxyCODONE-acetaminophen (PERCOCET/ROXICET) 5-325 MG tablet Take 1 tablet by mouth every 6 (six) hours as needed for moderate pain. Reported on 12/12/2015 03/28/15  Yes Historical Provider, MD  pantoprazole (PROTONIX) 40 MG tablet Take 1 tablet (40 mg total) by mouth daily. 02/22/15  Yes Daniel J Angiulli, PA-C  pravastatin (PRAVACHOL) 20 MG tablet Take 20 mg by mouth daily.   Yes Historical Provider, MD  pregabalin (LYRICA) 75 MG capsule Take 1 capsule (75 mg total) by mouth daily. 11/24/15  Yes Gay Filler Copland, MD  RENVELA 2.4 G PACK Take 2.4-4.8 g by  mouth 3 (three) times daily with meals. 2 PACKS WITH MEALS 3 TIMES DAILY, 1 PACK WITH SNACKS 08/07/15  Yes Historical Provider, MD  temazepam (RESTORIL) 30 MG capsule Take 30 mg by mouth at bedtime as needed for sleep. Reported on 12/12/2015   Yes Historical Provider, MD  thiamine 100 MG tablet Take 1 tablet (100 mg total) by mouth daily. 02/22/15  Yes Daniel J Angiulli, PA-C  ALPRAZolam (XANAX) 0.25 MG tablet Take 1 tablet (0.25 mg total) by mouth every 8 (eight) hours as needed for anxiety (0.25mg -0.5mg ). Patient not taking: Reported on 12/12/2015 02/22/15   Lavon Paganini Angiulli, PA-C   BP 122/85 mmHg  Pulse 117  Temp(Src) 98.8 F (37.1 C) (Oral)  Resp 19  Ht 5\' 5"  (1.651 m)  Wt 137 lb 5 oz (62.285 kg)  BMI 22.85 kg/m2  SpO2 100%  LMP 07/18/2012 Physical Exam  Constitutional: She is oriented to person, place, and time. She appears well-developed and well-nourished. No distress.  HENT:  Head: Normocephalic and atraumatic.  Mouth/Throat: Oropharynx is clear and moist. No oropharyngeal exudate.  Eyes: EOM are normal. Pupils are equal, round, and reactive to light.  Neck: Normal range of motion. Neck supple.  Cardiovascular: Normal rate and regular rhythm.   Pulmonary/Chest: Effort normal and breath sounds  normal. No respiratory distress. She has no wheezes. She has no rales.  Dialysis catheter in left chest about evidence of erythema, warmth or swelling.  Abdominal: Soft. Bowel sounds are normal. She exhibits distension (diffuse abdominal distention.). She exhibits no mass. There is tenderness ( Mild tenderness to palpation in the epigastric and right upper regions without rebound or guarding.). There is no rebound and no guarding.  Musculoskeletal: Normal range of motion. She exhibits no edema or tenderness.  No CVA tenderness bilaterally. No midline thoracic or lumbar tenderness. No lower extremity swelling or pain.  Neurological: She is alert and oriented to person, place, and time.  Moves all extremities without deficit. Sensation is grossly intact.  Skin: Skin is warm and dry. No rash noted. No erythema.  Psychiatric: She has a normal mood and affect. Her behavior is normal.  Nursing note and vitals reviewed.   ED Course  Procedures   DIAGNOSTIC STUDIES:  Oxygen Saturation is 99% on RA, normal by my interpretation.    COORDINATION OF CARE:  11:34 PM Discussed treatment plan with pt at bedside and pt agreed to plan.  Labs Review Labs Reviewed  COMPREHENSIVE METABOLIC PANEL - Abnormal; Notable for the following:    Chloride 93 (*)    CO2 21 (*)    Glucose, Bld 164 (*)    BUN 29 (*)    Creatinine, Ser 6.71 (*)    Total Protein 10.3 (*)    GFR calc non Af Amer 6 (*)    GFR calc Af Amer 7 (*)    Anion gap 22 (*)    All other components within normal limits  CBC - Abnormal; Notable for the following:    RBC 5.23 (*)    RDW 17.5 (*)    All other components within normal limits  LIPASE, BLOOD  I-STAT TROPOININ, ED    Imaging Review Ct Abdomen Pelvis Wo Contrast  12/28/2015  CLINICAL DATA:  Acute onset of upper abdominal pain, radiating to the back. Nausea and vomiting. Diarrhea. Initial encounter. EXAM: CT ABDOMEN AND PELVIS WITHOUT CONTRAST TECHNIQUE: Multidetector CT  imaging of the abdomen and pelvis was performed following the standard protocol without IV contrast. COMPARISON:  CT  of the abdomen and pelvis performed 11/19/2014 FINDINGS: Mild left basilar scarring is noted. There is dilatation of the left hepatic duct and common bile duct, measuring up to 1.1 cm. This raises concern for distal obstruction. The liver and spleen are otherwise unremarkable in appearance. The gallbladder is within normal limits. The pancreas and adrenal glands are unremarkable. A 2.0 cm left renal cyst is noted. Mild bilateral renal atrophy is noted. The kidneys are otherwise unremarkable. There is no evidence of hydronephrosis. No renal or ureteral stones are seen. No perinephric stranding is appreciated. No free fluid is identified. The small bowel is unremarkable in appearance. The stomach is within normal limits. No acute vascular abnormalities are seen. Scattered calcification is noted along the abdominal aorta and its branches. There is mild ectasia of the distal abdominal aorta. The appendix is normal in caliber, without evidence of appendicitis. The colon is unremarkable in appearance. The bladder is decompressed and not well assessed. The uterus is unremarkable in appearance. The ovaries are relatively symmetric. No suspicious adnexal masses are seen. No inguinal lymphadenopathy is seen. No acute osseous abnormalities are identified. IMPRESSION: 1. No acute abnormality seen within the abdomen or pelvis. 2. Dilatation of the left hepatic duct and common bile duct, measuring up to 1.1 cm. This raises concern for distal obstruction. Would correlate with LFTs. MRCP or ERCP could be considered for further evaluation. 3. Left renal cysts noted.  Mild bilateral renal atrophy seen. 4. Mild left basilar scarring noted. 5. Scattered calcification along the abdominal aorta and its branches. Electronically Signed   By: Garald Balding M.D.   On: 12/28/2015 02:33   I have personally reviewed and  evaluated these images and lab results as part of my medical decision-making.   EKG Interpretation None      MDM   Final diagnoses:  Right upper quadrant pain  Dilated intrahepatic bile duct    I personally performed the services described in this documentation, which was scribed in my presence. The recorded information has been reviewed and is accurate.    With persistent pain despite medication. CT with evidence of biliary ductal dilatation and possible obstruction. Discussed with medicine and will admit for GI consult and possible MRCP.  Julianne Rice, MD 12/28/15 [phone]

## 2015-12-27 NOTE — ED Notes (Signed)
Patient here with complaint of abdominal pain. States onset today prior to dialysis. States she has been on consecutive dialysis treatments for 2 days with today being the 3rd. Tachycardic in triage. Denies fever. Endorses diarrhea; but reports use of stool softener.

## 2015-12-28 ENCOUNTER — Ambulatory Visit: Payer: Managed Care, Other (non HMO) | Admitting: Family

## 2015-12-28 ENCOUNTER — Emergency Department (HOSPITAL_COMMUNITY): Payer: Managed Care, Other (non HMO)

## 2015-12-28 ENCOUNTER — Observation Stay (HOSPITAL_COMMUNITY): Payer: Managed Care, Other (non HMO)

## 2015-12-28 ENCOUNTER — Telehealth: Payer: Self-pay | Admitting: Vascular Surgery

## 2015-12-28 ENCOUNTER — Encounter (HOSPITAL_COMMUNITY): Payer: Self-pay | Admitting: General Practice

## 2015-12-28 DIAGNOSIS — K219 Gastro-esophageal reflux disease without esophagitis: Secondary | ICD-10-CM | POA: Diagnosis present

## 2015-12-28 DIAGNOSIS — R1013 Epigastric pain: Secondary | ICD-10-CM | POA: Diagnosis not present

## 2015-12-28 DIAGNOSIS — E785 Hyperlipidemia, unspecified: Secondary | ICD-10-CM

## 2015-12-28 DIAGNOSIS — N186 End stage renal disease: Secondary | ICD-10-CM | POA: Diagnosis not present

## 2015-12-28 DIAGNOSIS — I4581 Long QT syndrome: Secondary | ICD-10-CM

## 2015-12-28 DIAGNOSIS — R935 Abnormal findings on diagnostic imaging of other abdominal regions, including retroperitoneum: Secondary | ICD-10-CM

## 2015-12-28 DIAGNOSIS — R9431 Abnormal electrocardiogram [ECG] [EKG]: Secondary | ICD-10-CM | POA: Diagnosis present

## 2015-12-28 DIAGNOSIS — Z992 Dependence on renal dialysis: Secondary | ICD-10-CM

## 2015-12-28 DIAGNOSIS — M329 Systemic lupus erythematosus, unspecified: Secondary | ICD-10-CM

## 2015-12-28 DIAGNOSIS — R109 Unspecified abdominal pain: Secondary | ICD-10-CM | POA: Diagnosis present

## 2015-12-28 LAB — MRSA PCR SCREENING: MRSA BY PCR: POSITIVE — AB

## 2015-12-28 MED ORDER — VANCOMYCIN HCL IN DEXTROSE 750-5 MG/150ML-% IV SOLN
750.0000 mg | INTRAVENOUS | Status: DC
  Filled 2015-12-28: qty 150

## 2015-12-28 MED ORDER — PREGABALIN 75 MG PO CAPS
75.0000 mg | ORAL_CAPSULE | Freq: Every day | ORAL | Status: DC
  Administered 2015-12-28 – 2016-01-01 (×5): 75 mg via ORAL
  Filled 2015-12-28: qty 1
  Filled 2015-12-28 (×2): qty 3
  Filled 2015-12-28 (×2): qty 1

## 2015-12-28 MED ORDER — ACETAMINOPHEN 325 MG PO TABS
650.0000 mg | ORAL_TABLET | Freq: Four times a day (QID) | ORAL | Status: DC | PRN
Start: 2015-12-28 — End: 2016-01-01
  Administered 2015-12-28 – 2015-12-30 (×3): 650 mg via ORAL
  Filled 2015-12-28 (×2): qty 2

## 2015-12-28 MED ORDER — PRAVASTATIN SODIUM 20 MG PO TABS
20.0000 mg | ORAL_TABLET | Freq: Every day | ORAL | Status: DC
  Administered 2015-12-28 – 2016-01-01 (×5): 20 mg via ORAL
  Filled 2015-12-28 (×5): qty 1

## 2015-12-28 MED ORDER — ACETAMINOPHEN 650 MG RE SUPP
650.0000 mg | Freq: Four times a day (QID) | RECTAL | Status: DC | PRN
Start: 2015-12-28 — End: 2015-12-29

## 2015-12-28 MED ORDER — ONDANSETRON HCL 4 MG/2ML IJ SOLN: 4.0000 mg | Freq: Four times a day (QID) | INTRAMUSCULAR | Status: DC | PRN

## 2015-12-28 MED ORDER — OXYCODONE-ACETAMINOPHEN 5-325 MG PO TABS
1.0000 | ORAL_TABLET | Freq: Four times a day (QID) | ORAL | Status: DC | PRN
  Administered 2015-12-28 – 2015-12-29 (×2): 1 via ORAL
  Filled 2015-12-28 (×2): qty 1

## 2015-12-28 MED ORDER — VANCOMYCIN HCL 10 G IV SOLR
1500.0000 mg | INTRAVENOUS | Status: AC
  Administered 2015-12-29: 1500 mg via INTRAVENOUS
  Filled 2015-12-28: qty 1500

## 2015-12-28 MED ORDER — SEVELAMER CARBONATE 2.4 G PO PACK
4.8000 g | PACK | Freq: Three times a day (TID) | ORAL | Status: DC
  Administered 2015-12-29 – 2016-01-01 (×7): 4.8 g via ORAL
  Filled 2015-12-28 (×17): qty 2

## 2015-12-28 MED ORDER — PIPERACILLIN-TAZOBACTAM IN DEX 2-0.25 GM/50ML IV SOLN
2.2500 g | Freq: Three times a day (TID) | INTRAVENOUS | Status: DC
  Administered 2015-12-29 – 2016-01-01 (×10): 2.25 g via INTRAVENOUS
  Filled 2015-12-28 (×16): qty 50

## 2015-12-28 MED ORDER — PANTOPRAZOLE SODIUM 40 MG PO TBEC
40.0000 mg | DELAYED_RELEASE_TABLET | Freq: Every day | ORAL | Status: DC
  Administered 2015-12-28 – 2016-01-01 (×5): 40 mg via ORAL
  Filled 2015-12-28 (×5): qty 1

## 2015-12-28 MED ORDER — CHLORHEXIDINE GLUCONATE CLOTH 2 % EX PADS
6.0000 | MEDICATED_PAD | Freq: Every day | CUTANEOUS | Status: DC
  Administered 2015-12-29 – 2016-01-01 (×4): 6 via TOPICAL

## 2015-12-28 MED ORDER — SODIUM CHLORIDE 0.9% FLUSH
3.0000 mL | Freq: Two times a day (BID) | INTRAVENOUS | Status: DC
  Administered 2015-12-28 (×2): 3 mL via INTRAVENOUS

## 2015-12-28 MED ORDER — RENA-VITE PO TABS
1.0000 | ORAL_TABLET | Freq: Every day | ORAL | Status: DC
  Administered 2015-12-28 – 2015-12-29 (×2): 1 via ORAL
  Filled 2015-12-28 (×2): qty 1

## 2015-12-28 MED ORDER — CINACALCET HCL 30 MG PO TABS
30.0000 mg | ORAL_TABLET | Freq: Every day | ORAL | Status: DC
  Administered 2015-12-28 – 2016-01-01 (×5): 30 mg via ORAL
  Filled 2015-12-28 (×6): qty 1

## 2015-12-28 MED ORDER — MUPIROCIN 2 % EX OINT
1.0000 "application " | TOPICAL_OINTMENT | Freq: Two times a day (BID) | CUTANEOUS | Status: DC
  Administered 2015-12-28 – 2016-01-01 (×8): 1 via NASAL
  Filled 2015-12-28 (×3): qty 22

## 2015-12-28 MED ORDER — MORPHINE SULFATE (PF) 4 MG/ML IV SOLN: 4.0000 mg | Freq: Once | INTRAVENOUS | Status: DC

## 2015-12-28 MED ORDER — VITAMIN B-1 100 MG PO TABS
100.0000 mg | ORAL_TABLET | Freq: Every day | ORAL | Status: DC
  Administered 2015-12-28 – 2016-01-01 (×5): 100 mg via ORAL
  Filled 2015-12-28 (×5): qty 1

## 2015-12-28 MED ORDER — LIDOCAINE-PRILOCAINE 2.5-2.5 % EX CREA
1.0000 "application " | TOPICAL_CREAM | CUTANEOUS | Status: DC | PRN
  Filled 2015-12-28: qty 5

## 2015-12-28 MED ORDER — ALBUTEROL SULFATE (2.5 MG/3ML) 0.083% IN NEBU: 2.5000 mg | INHALATION_SOLUTION | RESPIRATORY_TRACT | Status: DC | PRN

## 2015-12-28 MED ORDER — ONDANSETRON HCL 4 MG/2ML IJ SOLN
4.0000 mg | Freq: Once | INTRAMUSCULAR | Status: AC
  Administered 2015-12-28: 4 mg via INTRAVENOUS
  Filled 2015-12-28: qty 2

## 2015-12-28 MED ORDER — HEPARIN SODIUM (PORCINE) 5000 UNIT/ML IJ SOLN
5000.0000 [IU] | Freq: Three times a day (TID) | INTRAMUSCULAR | Status: DC
  Administered 2015-12-28 – 2016-01-01 (×12): 5000 [IU] via SUBCUTANEOUS
  Filled 2015-12-28 (×11): qty 1

## 2015-12-28 MED ORDER — MORPHINE SULFATE (PF) 4 MG/ML IV SOLN
4.0000 mg | Freq: Once | INTRAVENOUS | Status: AC
  Administered 2015-12-28: 4 mg via INTRAVENOUS
  Filled 2015-12-28: qty 1

## 2015-12-28 MED ORDER — ONDANSETRON HCL 4 MG PO TABS: 4.0000 mg | ORAL_TABLET | Freq: Four times a day (QID) | ORAL | Status: DC | PRN

## 2015-12-28 MED ORDER — SEVELAMER CARBONATE 2.4 G PO PACK
2.4000 g | PACK | ORAL | Status: DC | PRN
  Filled 2015-12-28: qty 1

## 2015-12-28 MED ORDER — MORPHINE SULFATE (PF) 2 MG/ML IV SOLN
2.0000 mg | INTRAVENOUS | Status: DC | PRN
  Administered 2015-12-28: 2 mg via INTRAVENOUS
  Filled 2015-12-28: qty 1

## 2015-12-28 MED ORDER — TEMAZEPAM 7.5 MG PO CAPS: 30.0000 mg | ORAL_CAPSULE | Freq: Every evening | ORAL | Status: DC | PRN

## 2015-12-28 MED ORDER — ACETAMINOPHEN 325 MG PO TABS
650.0000 mg | ORAL_TABLET | ORAL | Status: DC | PRN
  Filled 2015-12-28: qty 2

## 2015-12-28 MED ORDER — HYDROXYCHLOROQUINE SULFATE 200 MG PO TABS
200.0000 mg | ORAL_TABLET | Freq: Two times a day (BID) | ORAL | Status: DC
  Administered 2015-12-28 – 2016-01-01 (×9): 200 mg via ORAL
  Filled 2015-12-28 (×11): qty 1

## 2015-12-28 NOTE — ED Notes (Signed)
Nurse asked secretary to page admit Doctor.

## 2015-12-28 NOTE — ED Notes (Signed)
GI provider at bedside ?

## 2015-12-28 NOTE — ED Notes (Signed)
Spoke with doctor notified doctor of MRI MRCP results and ordered patient clear liquid diet.

## 2015-12-28 NOTE — Consult Note (Signed)
[facility] Consult: 8:56 AM 12/28/2015  LOS: 0 days    Referring Provider: Dr Maryland Pink  Primary Care Physician:  Lamar Blinks, MD Primary Gastroenterologist:  Dr. Ardis Hughs.      Reason for Consultation:  Epigastric pain and dialted CBD.    HPI: [name] [name] is a 55 y.o. female.  Hx ESRD.  Anemia of ESRD.  DM2, no diabetic meds on list. Lupus on Plaquenil and Cellcept.  Rheumatoid arthritis.  Torticollis.  HTN.  CVA 10/2014 (tPA given). Suspected ischemic colitis in 11/2014.  Protein calorie malnutrition. Diastolic heart failure.  12/12/2015 Colonoscopy.  Avg risk screen.  Dr Ardis Hughs.  Normal study. 2007 Colonoscopy.  For IDA.  Hyperplastic sigmoid polyp.  2007 EGD. Normal study. SB biopsies negative for villous atrophy.  Patient had been experiencing left arm steal syndrome and underwent 12/21/2015 ligation of left arm fistula and first stage placement of right basilic vein fistula, placement of left IJ dialysis catheter. This week she's been on an accelerated dialysis schedule and underwent dialysis on 2/21, 2/22, 2/23  12/27/15 ~ 9 AM pt had onset epigastric pain, radiation to back, 9 to 10/10 severity. Took 2 Aleve and went to HD, pain continued.  Better after she got off dialysis machine to use bathroom but returned once HD resumed. Single episode non bloody emesis.  Pain and malaise ongoing after returning home, so came to ED.   CT scan: 1.1 cm CBD and dilated left hepatic duct.  GB, live, spleen normal.  WBCs normal, no fever.   LFTs, including albumin,  Normal. Lipase 34.    Past Medical History  Diagnosis Date  . Hypertension   . Lupus ([place])   . CKD (chronic kidney disease)     due to lupus/Dr. [name]  . Stenosis of cervical spine region     with HNP at C5/6, C6/7  . Metatarsal bone fracture right      4th  . 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 Arthritis     RA  . Anemia   . Stroke ([place]) 11/2014    left sided weakness, dysphagia  . Pain in joints   . Paresthesias   . Hyperlipidemia   . Colitis     ? per hospital notes 11/2014  . Lower GI bleed   . Hyponatremia   . Metabolic acidosis   . Hyperplastic colon polyp   . Hemorrhoids   . Blood transfusion without reported diagnosis   . Myocardial infarction ([place])   . Diabetes mellitus ([place])     border line    Past Surgical History  Procedure Laterality Date  . Breast biopsy    . Tubal ligation    . Insertion of dialysis catheter Right 02/05/2015    Procedure: INSERTION OF DIALYSIS CATHETER - RIGHT INTERNAL JUGULAR;  Surgeon: Conrad Granton, MD;  Location: [facility];  Service: Vascular;  Laterality: Right;  . Bascilic vein transposition Left 02/14/2015    Procedure: BASILIC VEIN Fistula Creation First  Stage;  Surgeon: Rosetta Posner, MD;  Location: [facility];  Service: Vascular;  Laterality: Left;  . Colonoscopy w/ biopsies and polypectomy    . Bascilic vein transposition Left 03/28/2015    Procedure: LEFT ARM SECOND STAGE BASCILIC VEIN TRANSPOSITION;  Surgeon: Rosetta Posner, MD;  Location: [facility];  Service: Vascular;  Laterality: Left;  . Revison of arteriovenous fistula Left 09/26/2015    Procedure: Revision Left Basilic Vein Transposition with Banding using Gortex graft;  Surgeon: Mal Misty, MD;  Location: [facility];  Service: Vascular;  Laterality: Left;  . Insertion of dialysis catheter N/A 10/05/2015    Procedure: INSERTION OF DIALYSIS CATHETER Right Internal Jugular.;  Surgeon: Mal Misty, MD;  Location: [facility];  Service: Vascular;  Laterality: N/A;  . Peripheral vascular catheterization N/A 10/10/2015    Procedure: Fistulagram;  Surgeon: [name], MD;  Location: [facility];  Service: Cardiovascular;  Laterality: N/A;  . Ligation of arteriovenous  fistula Left 12/21/2015     Procedure: LIGATION OF ARTERIOVENOUS  FISTULA LEFT ARM;  Surgeon: [name], MD;  Location: [facility];  Service: Vascular;  Laterality: Left;  . Av fistula placement Right 12/21/2015    Procedure: ARTERIOVENOUS (AV) FISTULA CREATION RIGHT ARM;  Surgeon: [name], MD;  Location: [facility];  Service: Vascular;  Laterality: Right;  . Insertion of dialysis catheter Left 12/21/2015    Procedure: INSERTION OF DIALYSIS CATHETER LEFT INTERNAL JUGULAR VEIN;  Surgeon: [name], MD;  Location: [facility];  Service: Vascular;  Laterality: Left;    Prior to Admission medications   Medication Sig Start Date End Date Taking? Authorizing Provider  acetaminophen (TYLENOL) 325 MG tablet Take 2 tablets (650 mg total) by mouth every 4 (four) hours as needed for mild pain. 10/25/14  Yes Ivan Anchors Love, PA-C  cinacalcet (SENSIPAR) 30 MG tablet Take 30 mg by mouth daily.   Yes Historical Provider, MD  docusate sodium (COLACE) 50 MG capsule Take 50 mg by mouth 2 (two) times daily. Reported on 12/12/2015   Yes Historical Provider, MD  HYDROcodone-acetaminophen (NORCO) 7.5-325 MG tablet Take 1 tablet by mouth every 6 (six) hours as needed for severe pain. 12/21/15  Yes Ulyses Amor, PA-C  hydroxychloroquine (PLAQUENIL) 200 MG tablet Take 1 tablet (200 mg total) by mouth 2 (two) times daily. 07/02/15  Yes [name], MD  lidocaine-prilocaine (EMLA) cream Apply 1 application topically as needed.   Yes Historical Provider, MD  multivitamin (RENA-VIT) TABS tablet Take 1 tablet by mouth daily.   Yes Historical Provider, MD  oxyCODONE-acetaminophen (PERCOCET/ROXICET) 5-325 MG tablet Take 1 tablet by mouth every 6 (six) hours as needed for moderate pain. Reported on 12/12/2015 03/28/15  Yes Historical Provider, MD  pantoprazole (PROTONIX) 40 MG tablet Take 1 tablet (40 mg total) by mouth daily. 02/22/15  Yes Daniel J Angiulli, PA-C  pravastatin (PRAVACHOL) 20 MG tablet Take 20 mg by mouth daily.   Yes Historical Provider, MD   pregabalin (LYRICA) 75 MG capsule Take 1 capsule (75 mg total) by mouth daily. 11/24/15  Yes Gay Filler Copland, MD  RENVELA 2.4 G PACK Take 2.4-4.8 g by mouth 3 (three) times daily with meals. 2 PACKS WITH MEALS 3 TIMES DAILY, 1 PACK WITH SNACKS 08/07/15  Yes Historical Provider, MD  temazepam (RESTORIL) 30 MG capsule Take 30 mg by mouth at bedtime as needed for sleep. Reported on 12/12/2015   Yes Historical Provider, MD  thiamine 100 MG  tablet Take 1 tablet (100 mg total) by mouth daily. 02/22/15  Yes Daniel J Angiulli, PA-C  ALPRAZolam (XANAX) 0.25 MG tablet Take 1 tablet (0.25 mg total) by mouth every 8 (eight) hours as needed for anxiety (0.25mg -0.5mg ). Patient not taking: Reported on 12/12/2015 02/22/15   Lavon Paganini Angiulli, PA-C    Scheduled Meds: . Barium Sulfate      . cinacalcet  30 mg Oral Q breakfast  . heparin  5,000 Units Subcutaneous 3 times per day  . hydroxychloroquine  200 mg Oral BID  .  morphine injection  4 mg Intravenous Once  . multivitamin  1 tablet Oral Daily  . pantoprazole  40 mg Oral Daily  . pravastatin  20 mg Oral Daily  . pregabalin  75 mg Oral Daily  . sevelamer carbonate  4.8 g Oral TID WC  . sodium chloride flush  3 mL Intravenous Q12H  . thiamine  100 mg Oral Daily   Infusions:   PRN Meds: acetaminophen **OR** acetaminophen, acetaminophen, albuterol, lidocaine-prilocaine, morphine injection, ondansetron **OR** ondansetron (ZOFRAN) IV, oxyCODONE-acetaminophen, sevelamer carbonate, temazepam   Allergies as of 12/27/2015 - Review Complete 12/27/2015  Allergen Reaction Noted  . Sulfa antibiotics Itching 03/01/2012    Family History  Problem Relation Age of Onset  . Hypertension    . Lupus    . Rheum arthritis    . Hypertension Mother   . Diabetes Mother   . Hypertension Sister   . Diabetes Father   . Hypertension Maternal Grandmother   . Colon cancer Neg Hx     Social History   Social History  . Marital Status: Single    Spouse Name: N/A  .  Number of Children: N/A  . Years of Education: N/A   Occupational History  . Not on file.   Social History Main Topics  . Smoking status: Former Smoker -- 0.50 packs/day for 30 years    Types: Cigarettes    Quit date: 09/22/2014  . Smokeless tobacco: Never Used  . Alcohol Use: No  . Drug Use: No  . Sexual Activity: Not on file   Other Topics Concern  . Not on file   Social History Narrative    REVIEW OF SYSTEMS: Constitutional:  Weight is stable. Energy level is a bit low but stable. ENT:  No nose bleeds.  Has had several molars pulled in recent times as preparation for placement of partial dentures. No dysphagia. Pulm:  Stable DOE.  No cough. CV:  No palpitations, no LE edema.  GU:  No hematuria, no frequency GI:  No dysphagia. No significant heartburn. Appetite Heme:  No excessive bleeding or bruising.   Transfusions:  Last blood transfusion was in 01/2015. Neuro:  No headaches, no peripheral tingling or numbness Derm:  No itching, no rash or sores.  Endocrine:  No sweats or chills.  No polyuria or dysuria Immunization:  Did not inquire.  Travel:  None beyond local counties in last few months.    PHYSICAL EXAM: Vital signs in last 24 hours: Filed Vitals:   12/28/15 0630 12/28/15 0700  BP: 92/71   Pulse: 101 99  Temp:    Resp: 16 20   Wt Readings from Last 3 Encounters:  12/27/15 62.285 kg (137 lb 5 oz)  12/21/15 60.782 kg (134 lb)  12/12/15 60.782 kg (134 lb)   General: Pleasant, somewhat chronically ill AAF who looks a bit older than stated age. Head:  No swelling, no facial asymmetry.  Eyes:  No scleral  icterus, no conjunctival pallor. Ears:  Hearing intact.  Nose:  Congestion or discharge. Mouth:  Slightly dry oral mucosa.  All of her molars are gone. Bulk of her incisors remain. Neck:  No JVD, no masses. No TMG Lungs:  Some crackles in the bases. No respiratory distress or cough. Heart: RRR. No MRG. Abdomen:  Soft. Not distended. Minimal right upper  quadrant/epigastric tenderness. No HSM. No masses..   Rectal: Deferred.   Musc/Skeltl: Contracture deformities. No joint swelling Extremities:  No CCE. Upper extremity incision scars are clean dry and intact. Neurologic:  Alert. Oriented 3. No limb tremor. Moves all 4 limbs, strength not tested. Skin:  No open sores or obvious rashes. Tattoos:  None see. Nodes:  No cervical or inguinal adenopathy.   Psych:  Pleasant, cooperative.   Intake/Output from previous day:   Intake/Output this shift:    LAB RESULTS:  Recent Labs  12/27/15 2239  WBC 8.7  HGB 14.3  HCT 44.8  PLT 195   BMET Lab Results  Component Value Date   NA 136 12/27/2015   NA 139 12/21/2015   NA 132* 12/21/2015   K 5.1 12/27/2015   K 4.9 12/21/2015   K 6.7* 12/21/2015   CL 93* 12/27/2015   CL 97* 10/10/2015   CL 101 06/29/2015   CO2 21* 12/27/2015   CO2 21 06/29/2015   CO2 26 02/22/2015   GLUCOSE 164* 12/27/2015   GLUCOSE 69 12/21/2015   GLUCOSE 70 12/21/2015   BUN 29* 12/27/2015   BUN 26* 10/10/2015   BUN 58* 06/29/2015   CREATININE 6.71* 12/27/2015   CREATININE 5.70* 10/10/2015   CREATININE 9.61* 06/29/2015   CALCIUM 10.3 12/27/2015   CALCIUM 9.2 06/29/2015   CALCIUM 9.2 02/22/2015   LFT  Recent Labs  12/27/15 2239  PROT 10.3*  ALBUMIN 3.7  AST 25  ALT 15  ALKPHOS 104  BILITOT 1.1   PT/INR Lab Results  Component Value Date   INR 1.29 02/02/2015   INR 1.18 11/19/2014   INR 1.06 09/28/2014   Hepatitis Panel No results for input(s): HEPBSAG, HCVAB, HEPAIGM, HEPBIGM in the last 72 hours. C-Diff No components found for: CDIFF Lipase     Component Value Date/Time   LIPASE 34 12/27/2015 2239    Drugs of Abuse     Component Value Date/Time   LABOPIA NONE DETECTED 09/28/2014 2239   COCAINSCRNUR NONE DETECTED 09/28/2014 2239   LABBENZ NONE DETECTED 09/28/2014 2239   AMPHETMU NONE DETECTED 09/28/2014 2239   THCU NONE DETECTED 09/28/2014 2239   LABBARB NONE DETECTED  09/28/2014 2239     RADIOLOGY STUDIES: Ct Abdomen Pelvis Wo Contrast  12/28/2015  CLINICAL DATA:  Acute onset of upper abdominal pain, radiating to the back. Nausea and vomiting. Diarrhea. Initial encounter. EXAM: CT ABDOMEN AND PELVIS WITHOUT CONTRAST TECHNIQUE: Multidetector CT imaging of the abdomen and pelvis was performed following the standard protocol without IV contrast. COMPARISON:  CT of the abdomen and pelvis performed 11/19/2014 FINDINGS: Mild left basilar scarring is noted. There is dilatation of the left hepatic duct and common bile duct, measuring up to 1.1 cm. This raises concern for distal obstruction. The liver and spleen are otherwise unremarkable in appearance. The gallbladder is within normal limits. The pancreas and adrenal glands are unremarkable. A 2.0 cm left renal cyst is noted. Mild bilateral renal atrophy is noted. The kidneys are otherwise unremarkable. There is no evidence of hydronephrosis. No renal or ureteral stones are seen. No perinephric stranding is appreciated.  No free fluid is identified. The small bowel is unremarkable in appearance. The stomach is within normal limits. No acute vascular abnormalities are seen. Scattered calcification is noted along the abdominal aorta and its branches. There is mild ectasia of the distal abdominal aorta. The appendix is normal in caliber, without evidence of appendicitis. The colon is unremarkable in appearance. The bladder is decompressed and not well assessed. The uterus is unremarkable in appearance. The ovaries are relatively symmetric. No suspicious adnexal masses are seen. No inguinal lymphadenopathy is seen. No acute osseous abnormalities are identified. IMPRESSION: 1. No acute abnormality seen within the abdomen or pelvis. 2. Dilatation of the left hepatic duct and common bile duct, measuring up to 1.1 cm. This raises concern for distal obstruction. Would correlate with LFTs. MRCP or ERCP could be considered for further  evaluation. 3. Left renal cysts noted.  Mild bilateral renal atrophy seen. 4. Mild left basilar scarring noted. 5. Scattered calcification along the abdominal aorta and its branches. Electronically Signed   By: Garald Balding M.D.   On: 12/28/2015 02:33    ENDOSCOPIC STUDIES: Per HPI  IMPRESSION:   *  Acute epigastric/upper abd pain.  CT with dilated CBD, left hepatic duct without obvious CBD stone or obstruction; normal LFTs.   *  ESRD.  She reports accelerated dialysis schedule this week.  *  2/17 surgery for ligation of existing left arm fistula and placement of dialysis catheter and initial stage of right basilic vein fistula placement.   *  Hx iron def and anemia CKD.  Hgb currently good.     PLAN:     *  Ran case by Dr Ardis Hughs. Will order MRI/MRCP.   Azucena Freed  12/28/2015, 8:56 AM Pager: [phone]  Addendum 2:12 PM MRI ABDOMEN WITHOUT CONTRAST (INCLUDING MRCP) IMPRESSION: 1. Dilatation of the left hepatic duct common common hepatic duct, and to a lesser extent the common bile duct. There is minimal associated left intrahepatic biliary ductal dilatation. These findings are unusual and are of uncertain etiology and significance, but could suggest the presence of a choledochal cyst. 2. Iron deposition in the liver and spleen.

## 2015-12-28 NOTE — ED Notes (Signed)
Patient does not want to take Renvela at this time needs to eat solid food.

## 2015-12-28 NOTE — Telephone Encounter (Signed)
-----   Message from Mena Goes, RN sent at 12/27/2015  9:12 AM EST ----- Regarding: schedule   ----- Message -----    From: [name], MD    Sent: 12/26/2015   8:48 PM      To: [facility] Charge Pool  Surgeon:  [name] Assistants:  S. Rhyne Procedure:   #1.)  Ligation of left upper arm fistula   #2.)  Ultrasound guided placement of left IJ tunneled dialysis catheter   #3.)  1st stage right basilic vein fistula   F/u 6 weeks with u/s sound of fistula

## 2015-12-28 NOTE — Progress Notes (Signed)
Pharmacy Antibiotic Note  [name] [name] is a 55 y.o. female admitted on 12/27/2015 with midepigastric pain.  Pharmacy has been consulted for Vancomycin and Zosyn dosing for r/o sepsis. Pt with fever and hypotension.  ESRD - HD (T/T/S as o/p)  Plan: Zosyn 2.25gm IV q8h Vancomycin 1500mg  IV stat then 750mg  IV T/T/S with HD Will f/u micro data, renal function, and pt's clinical condition Vanc trough prn  Height: 5\' 5"  (165.1 cm) Weight: 140 lb 3.2 oz (63.594 kg) IBW/kg (Calculated) : 57  Temp (24hrs), Avg:99.8 F (37.7 C), Min:98.4 F (36.9 C), Max:101.2 F (38.4 C)   Recent Labs Lab 12/27/15 2239  WBC 8.7  CREATININE 6.71*    Estimated Creatinine Clearance: 8.7 mL/min (by C-G formula based on Cr of 6.71).    Allergies  Allergen Reactions  . Sulfa Antibiotics Itching    Antimicrobials this admission: 2/25 Vanc >>  2/25 Zosyn >>   Dose adjustments this admission: n/a  Microbiology results: 2/24 BCx:   2/25 RSV:    2/24 MRSA PCR: positive  Thank you for allowing pharmacy to be a part of this patient's care.  Sherlon Handing, PharmD, BCPS Clinical pharmacist, pager [phone] 12/28/2015 11:37 PM

## 2015-12-28 NOTE — Telephone Encounter (Signed)
LM for pt re appt, dpm °

## 2015-12-28 NOTE — ED Notes (Signed)
Secretary to page admit Doctor x2.

## 2015-12-28 NOTE — ED Notes (Signed)
A Renal Diet ordered for lunch.

## 2015-12-28 NOTE — H&P (Addendum)
[facility] History and Physical  [name] [name] [id number] DOB: 1961/05/05 DOA: 12/27/2015  Referring physician: ED PCP: [name] Blinks, MD   Chief Complaint:  Abdominal pain  HPI:  [name] [name] is a 55 year old with a past medical history of diabetes mellitus type 2, ESRD on HD T/Th/Sat, lupus, HTN; who presents with complaints of epigastric pain. Symptoms started acutely yesterday(2/23) morning at approximately 9 [name]. Pain was sharp and located in the epigastric region. Pain radiated around to her back. Nothing really helped to make symptoms better except for pain medications. Hemodialysis seemed to worsen pain symptoms. He reports eating one time around 1 PM yesterday for which she had one episode of nausea and vomiting. She rates the pain a 9.5 out of 10 on the pain scale. She reports associated symptoms of constipation and loose stools after using stool softeners. Patient notes that she had been receiving hemodialysis for the last 3 days consecutively and she has still syndrome, but states that she returns to her regular schedule and will need dialysis on Saturday.  Upon admission to the emergency department patient was evaluated with a CT scan of the abdomen which showed dilatation of the left hepatic and common bile duct measuring up to 1.1 cm.    Review of Systems  Constitutional: Negative for fever, chills, weight loss and malaise/fatigue.  HENT: Negative for ear pain and hearing loss.   Respiratory: Positive for shortness of breath (during dialysis).   Cardiovascular: Negative for palpitations and leg swelling.  Gastrointestinal: Positive for vomiting, abdominal pain and constipation.  Genitourinary: Negative for urgency and frequency.  Musculoskeletal: Positive for back pain.  Skin: Negative for itching and rash.  Neurological: Negative for speech change and seizures.  Endo/Heme/Allergies: Negative for environmental allergies and polydipsia.  Psychiatric/Behavioral:  Negative for hallucinations and substance abuse.        Past Medical History  Diagnosis Date  . Hypertension   . Lupus ([place])   . CKD (chronic kidney disease)     due to lupus/Dr. [name]  . Stenosis of cervical spine region     with HNP at C5/6, C6/7  . Metatarsal bone fracture right    4th  . 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 Arthritis     RA  . Anemia   . Stroke ([place]) 11/2014    left sided weakness, dysphagia  . Pain in joints   . Paresthesias   . Hyperlipidemia   . Colitis     ? per hospital notes 11/2014  . Lower GI bleed   . Hyponatremia   . Metabolic acidosis   . Hyperplastic colon polyp   . Hemorrhoids   . Blood transfusion without reported diagnosis   . Myocardial infarction ([place])   . Diabetes mellitus ([place])     border line     Past Surgical History  Procedure Laterality Date  . Breast biopsy    . Tubal ligation    . Insertion of dialysis catheter Right 02/05/2015    Procedure: INSERTION OF DIALYSIS CATHETER - RIGHT INTERNAL JUGULAR;  Surgeon: Conrad Anmoore, MD;  Location: [facility];  Service: Vascular;  Laterality: Right;  . Bascilic vein transposition Left 02/14/2015    Procedure: BASILIC VEIN Fistula Creation First Stage;  Surgeon: Rosetta Posner, MD;  Location: [facility];  Service: Vascular;  Laterality: Left;  . Colonoscopy w/ biopsies and polypectomy    . Bascilic vein transposition  Left 03/28/2015    Procedure: LEFT ARM SECOND STAGE BASCILIC VEIN TRANSPOSITION;  Surgeon: Rosetta Posner, MD;  Location: [facility];  Service: Vascular;  Laterality: Left;  . Revison of arteriovenous fistula Left 09/26/2015    Procedure: Revision Left Basilic Vein Transposition with Banding using Gortex graft;  Surgeon: Mal Misty, MD;  Location: [facility];  Service: Vascular;  Laterality: Left;  . Insertion of dialysis catheter N/A 10/05/2015    Procedure: INSERTION OF DIALYSIS CATHETER Right Internal Jugular.;  Surgeon: Mal Misty, MD;   Location: [facility];  Service: Vascular;  Laterality: N/A;  . Peripheral vascular catheterization N/A 10/10/2015    Procedure: Fistulagram;  Surgeon: [name], MD;  Location: [facility];  Service: Cardiovascular;  Laterality: N/A;  . Ligation of arteriovenous  fistula Left 12/21/2015    Procedure: LIGATION OF ARTERIOVENOUS  FISTULA LEFT ARM;  Surgeon: [name], MD;  Location: [facility];  Service: Vascular;  Laterality: Left;  . Av fistula placement Right 12/21/2015    Procedure: ARTERIOVENOUS (AV) FISTULA CREATION RIGHT ARM;  Surgeon: [name], MD;  Location: [facility];  Service: Vascular;  Laterality: Right;  . Insertion of dialysis catheter Left 12/21/2015    Procedure: INSERTION OF DIALYSIS CATHETER LEFT INTERNAL JUGULAR VEIN;  Surgeon: [name], MD;  Location: [facility];  Service: Vascular;  Laterality: Left;      Social History:  reports that she quit smoking about 15 months ago. Her smoking use included Cigarettes. She has a 15 pack-year smoking history. She has never used smokeless tobacco. She reports that she does not drink alcohol or use illicit drugs.   Allergies  Allergen Reactions  . Sulfa Antibiotics Itching    Family History  Problem Relation Age of Onset  . Hypertension    . Lupus    . Rheum arthritis    . Hypertension Mother   . Diabetes Mother   . Hypertension Sister   . Diabetes Father   . Hypertension Maternal Grandmother   . Colon cancer Neg Hx       Prior to Admission medications   Medication Sig Start Date End Date Taking? Authorizing Provider  acetaminophen (TYLENOL) 325 MG tablet Take 2 tablets (650 mg total) by mouth every 4 (four) hours as needed for mild pain. 10/25/14  Yes Ivan Anchors Love, PA-C  cinacalcet (SENSIPAR) 30 MG tablet Take 30 mg by mouth daily.   Yes Historical Provider, MD  docusate sodium (COLACE) 50 MG capsule Take 50 mg by mouth 2 (two) times daily. Reported on 12/12/2015   Yes Historical Provider, MD   HYDROcodone-acetaminophen (NORCO) 7.5-325 MG tablet Take 1 tablet by mouth every 6 (six) hours as needed for severe pain. 12/21/15  Yes Ulyses Amor, PA-C  hydroxychloroquine (PLAQUENIL) 200 MG tablet Take 1 tablet (200 mg total) by mouth 2 (two) times daily. 07/02/15  Yes [name], MD  lidocaine-prilocaine (EMLA) cream Apply 1 application topically as needed.   Yes Historical Provider, MD  multivitamin (RENA-VIT) TABS tablet Take 1 tablet by mouth daily.   Yes Historical Provider, MD  oxyCODONE-acetaminophen (PERCOCET/ROXICET) 5-325 MG tablet Take 1 tablet by mouth every 6 (six) hours as needed for moderate pain. Reported on 12/12/2015 03/28/15  Yes Historical Provider, MD  pantoprazole (PROTONIX) 40 MG tablet Take 1 tablet (40 mg total) by mouth daily. 02/22/15  Yes Daniel J Angiulli, PA-C  pravastatin (PRAVACHOL) 20 MG tablet Take 20 mg by mouth daily.  Yes Historical Provider, MD  pregabalin (LYRICA) 75 MG capsule Take 1 capsule (75 mg total) by mouth daily. 11/24/15  Yes Gay Filler Copland, MD  RENVELA 2.4 G PACK Take 2.4-4.8 g by mouth 3 (three) times daily with meals. 2 PACKS WITH MEALS 3 TIMES DAILY, 1 PACK WITH SNACKS 08/07/15  Yes Historical Provider, MD  temazepam (RESTORIL) 30 MG capsule Take 30 mg by mouth at bedtime as needed for sleep. Reported on 12/12/2015   Yes Historical Provider, MD  thiamine 100 MG tablet Take 1 tablet (100 mg total) by mouth daily. 02/22/15  Yes Daniel J Angiulli, PA-C  ALPRAZolam (XANAX) 0.25 MG tablet Take 1 tablet (0.25 mg total) by mouth every 8 (eight) hours as needed for anxiety (0.25mg -0.5mg ). Patient not taking: Reported on 12/12/2015 02/22/15   Cathlyn Parsons, PA-C     Physical Exam: Filed Vitals:   12/28/15 0245 12/28/15 0300 12/28/15 0315 12/28/15 0330  BP: 136/93 134/90 123/79 122/85  Pulse: 100 103 108 117  Temp:      TempSrc:      Resp: 13 18 12 19   Height:      Weight:      SpO2: 100% 100% 99% 100%     Constitutional: Vital signs  reviewed. Patient is a well-developed and well-nourished in no acute distress and cooperative with exam. Patient is lethargic, but responds appropriately to questions.  Head: Normocephalic and atraumatic  Ear: TM normal bilaterally  Mouth: no erythema or exudates, MMM  Eyes: PERRL, EOMI, conjunctivae normal, No scleral icterus.  Neck: Supple, Trachea midline normal ROM, No JVD, mass, thyromegaly, or carotid bruit present.  Cardiovascular: RRR, S1 normal, S2 normal, no MRG, pulses symmetric and intact bilaterally  Pulmonary/Chest: CTAB, no wheezes, rales, or rhonchi  Abdominal: Soft. Mild tenderness to palpation of the epigastric region appreciated more on the RUQ> LUQ.  Bowel sounds positive in all 4 quadrants GU: no CVA tenderness Musculoskeletal: No joint deformities, erythema, or stiffness, ROM full and no nontender Ext: no edema and no cyanosis, pulses palpable bilaterally (DP and PT) right upper extremity fistula Hematology: no cervical, inginal, or axillary adenopathy.  Neurological:  Strenght is normal and symmetric bilaterally, cranial nerve II-XII are grossly intact, no focal motor deficit, sensory intact to light touch bilaterally.  Skin: Warm, dry and intact. No rash, cyanosis, or clubbing.  Psychiatric: Normal mood and affect. speech and behavior is normal. Judgment and thought content normal. Cognition and memory are normal.      Data Review   Micro Results No results found for this or any previous visit (from the past 240 hour(s)).  Radiology Reports Ct Abdomen Pelvis Wo Contrast  12/28/2015  CLINICAL DATA:  Acute onset of upper abdominal pain, radiating to the back. Nausea and vomiting. Diarrhea. Initial encounter. EXAM: CT ABDOMEN AND PELVIS WITHOUT CONTRAST TECHNIQUE: Multidetector CT imaging of the abdomen and pelvis was performed following the standard protocol without IV contrast. COMPARISON:  CT of the abdomen and pelvis performed 11/19/2014 FINDINGS: Mild left basilar  scarring is noted. There is dilatation of the left hepatic duct and common bile duct, measuring up to 1.1 cm. This raises concern for distal obstruction. The liver and spleen are otherwise unremarkable in appearance. The gallbladder is within normal limits. The pancreas and adrenal glands are unremarkable. A 2.0 cm left renal cyst is noted. Mild bilateral renal atrophy is noted. The kidneys are otherwise unremarkable. There is no evidence of hydronephrosis. No renal or ureteral stones are seen. No perinephric  stranding is appreciated. No free fluid is identified. The small bowel is unremarkable in appearance. The stomach is within normal limits. No acute vascular abnormalities are seen. Scattered calcification is noted along the abdominal aorta and its branches. There is mild ectasia of the distal abdominal aorta. The appendix is normal in caliber, without evidence of appendicitis. The colon is unremarkable in appearance. The bladder is decompressed and not well assessed. The uterus is unremarkable in appearance. The ovaries are relatively symmetric. No suspicious adnexal masses are seen. No inguinal lymphadenopathy is seen. No acute osseous abnormalities are identified. IMPRESSION: 1. No acute abnormality seen within the abdomen or pelvis. 2. Dilatation of the left hepatic duct and common bile duct, measuring up to 1.1 cm. This raises concern for distal obstruction. Would correlate with LFTs. MRCP or ERCP could be considered for further evaluation. 3. Left renal cysts noted.  Mild bilateral renal atrophy seen. 4. Mild left basilar scarring noted. 5. Scattered calcification along the abdominal aorta and its branches. Electronically Signed   By: Garald Balding M.D.   On: 12/28/2015 02:33   Dg Chest Port 1v Same Day  12/21/2015  CLINICAL DATA:  Dialysis catheter placement. EXAM: PORTABLE CHEST 1 VIEW COMPARISON:  October 07, 2015. FINDINGS: The heart size and mediastinal contours are within normal limits. Left lung  is clear. Minimal right basilar subsegmental atelectasis or scarring is noted. No pneumothorax or pleural effusion is noted. Right internal jugular dialysis catheter noted on prior exam has been replaced with left internal jugular dialysis catheter, with distal tip in expected position of the SVC. The visualized skeletal structures are unremarkable. IMPRESSION: Placement of new left internal jugular dialysis catheter with distal tip in expected position of the SVC. No pneumothorax is noted. Electronically Signed   By: Marijo Conception, M.D.   On: 12/21/2015 13:07     CBC  Recent Labs Lab 12/21/15 0819 12/21/15 0832 12/27/15 2239  WBC  --   --  8.7  HGB 16.7* 16.0* 14.3  HCT 49.0* 47.0* 44.8  PLT  --   --  195  MCV  --   --  85.7  MCH  --   --  27.3  MCHC  --   --  31.9  RDW  --   --  17.5*    Chemistries   Recent Labs Lab 12/21/15 0819 12/21/15 0832 12/27/15 2239  NA 132* 139 136  K 6.7* 4.9 5.1  CL  --   --  93*  CO2  --   --  21*  GLUCOSE 70 69 164*  BUN  --   --  29*  CREATININE  --   --  6.71*  CALCIUM  --   --  10.3  AST  --   --  25  ALT  --   --  15  ALKPHOS  --   --  104  BILITOT  --   --  1.1   ------------------------------------------------------------------------------------------------------------------ estimated creatinine clearance is 8.7 mL/min (by C-G formula based on Cr of 6.71). ------------------------------------------------------------------------------------------------------------------ No results for input(s): HGBA1C in the last 72 hours. ------------------------------------------------------------------------------------------------------------------ No results for input(s): CHOL, HDL, LDLCALC, TRIG, CHOLHDL, LDLDIRECT in the last 72 hours. ------------------------------------------------------------------------------------------------------------------ No results for input(s): TSH, T4TOTAL, T3FREE, THYROIDAB in the last 72 hours.  Invalid  input(s): FREET3 ------------------------------------------------------------------------------------------------------------------ No results for input(s): VITAMINB12, FOLATE, FERRITIN, TIBC, IRON, RETICCTPCT in the last 72 hours.  Coagulation profile No results for input(s): INR, PROTIME in the last 168 hours.  No  results for input(s): DDIMER in the last 72 hours.  Cardiac Enzymes No results for input(s): CKMB, TROPONINI, MYOGLOBIN in the last 168 hours.  Invalid input(s): CK ------------------------------------------------------------------------------------------------------------------ Invalid input(s): POCBNP   CBG:  Recent Labs Lab 12/21/15 0717 12/21/15 1224 12/21/15 1242 12/21/15 1325  GLUCAP 74 62* 63* 91       EKG: Independently reviewed. Sinus tachycardia with Prolonged QTc of 567   Assessment/Plan Epigastric abdominal pain: Acute. Patient reports symptoms started around 9 [name] yesterday morning. CT scan of the abdomen showing dilatation of the left hepatic and common bile duct. Initial lab work shows lipase within normal limits. Question possibility of a dislodged stone causing patient's acute abdominal pain complaints. - Admit to a telemetry bed - NPO for now - Morphine prn severe pain  - Consulting  GI in [name] for possible need of ERCP  Abnormal EKG :Sinus tachycardia with prolonged QTC of 567  - Repeat EKG in a.m.  ESRD on HD: Patient dialyzesT/Th/Sat, but with a recent history of still's disease has been dialyzed daily over the last 3 days. Patient reports next dialysis scheduled for Saturday - Continue Sensipar, Renvela - May want to consult nephrology in [name]  Lupus - Continue Plaquenil  Gerd - Continue Protonix  Hyperlipidemia  -Continue pravastatin  Chronic pain - Continue Lyrica and oxycodone  Code Status:   full Family Communication: bedside Disposition Plan: admit   Total time spent 55 minutes.Greater than 50% of this time was spent  in counseling, explanation of diagnosis, planning of further management, and coordination of care  [facility] Pager [phone]  If 7PM-7AM, please contact night-coverage [email or web address] Password [facility] 12/28/2015, 4:22 [name]

## 2015-12-28 NOTE — ED Notes (Signed)
Called MRI confirmed patient NPO and patient last ate or drank one day ago at 1400.

## 2015-12-28 NOTE — Progress Notes (Addendum)
   Follow Up Note  HPI: Patient is a 55 year old with past mental history of end-stage renal disease on dialysis, lupus and hypertension who presented to the emergency room on the evening of 2/23 after started to have midepigastric pain radiate into her back starting earlier that morning. Pain was continuous. No nausea or vomiting. She came into the emergency room and underwent a CT scan which was unrevealing except for noting dilated left hepatic and common bile ducts. Seen by gastroenterology who have ordered MRCP. Rest the patient's labs including lipase, alkaline phosphatase and LFTs are normal.  Prolonged QT noted on EKG.  Pt admitted earlier this morning. Patient seen in the emergency room. She continues to complain of midepigastric pain although feeling a little bit better.   Exam: [id number] rate and rhythm, S1-S2  Lungs: clear to auscultation bilaterally  Abd: soft, nondistended, mild tenderness in midepigastric area, but no other areas, hypoactive bowel sounds  Ext: No clubbing or cyanosis or edema   Present on Admission:  . Epigastric pain with abnormal CT: Gastroenterology has ordered  MRCP. If normal, advance diet. May be potentially able to go home later today  . Lupus ([place]): Continue Plaquenil  . GERD (gastroesophageal reflux disease) . Hyperlipidemia . Prolonged Q-T interval on ECG : Repeat EKG this morning normal   end-stage renal disease: Will consult nephrology  Severe protein calorie malnutrition: We'll ask nutrition to see. Patient meets criteria in the context of chronic illness.    Disposition: Potential discharge later today or tomorrow if MRCP unrevealing and no further intervention and diet can be advanced.

## 2015-12-28 NOTE — ED Notes (Signed)
Patient transported to CT 

## 2015-12-28 NOTE — Progress Notes (Signed)
Pt has temp of 101.2 and PRN tylenol given. Donnal Debar, NP notified. Will continue to monitor pt and recheck temp.

## 2015-12-29 ENCOUNTER — Observation Stay (HOSPITAL_COMMUNITY): Payer: Managed Care, Other (non HMO)

## 2015-12-29 DIAGNOSIS — K838 Other specified diseases of biliary tract: Secondary | ICD-10-CM | POA: Insufficient documentation

## 2015-12-29 DIAGNOSIS — R935 Abnormal findings on diagnostic imaging of other abdominal regions, including retroperitoneum: Secondary | ICD-10-CM | POA: Diagnosis not present

## 2015-12-29 DIAGNOSIS — R1013 Epigastric pain: Secondary | ICD-10-CM | POA: Diagnosis not present

## 2015-12-29 LAB — CBC
HCT: 35.2 % — ABNORMAL LOW (ref 36.0–46.0)
HEMOGLOBIN: 11.4 g/dL — AB (ref 12.0–15.0)
MCH: 27.8 pg (ref 26.0–34.0)
MCHC: 32.4 g/dL (ref 30.0–36.0)
MCV: 85.9 fL (ref 78.0–100.0)
PLATELETS: 129 10*3/uL — AB (ref 150–400)
RBC: 4.1 MIL/uL (ref 3.87–5.11)
RDW: 17.5 % — ABNORMAL HIGH (ref 11.5–15.5)
WBC: 7.6 10*3/uL (ref 4.0–10.5)

## 2015-12-29 LAB — IRON AND TIBC
IRON: 14 ug/dL — AB (ref 28–170)
SATURATION RATIOS: 8 % — AB (ref 10.4–31.8)
TIBC: 182 ug/dL — ABNORMAL LOW (ref 250–450)
UIBC: 168 ug/dL

## 2015-12-29 LAB — COMPREHENSIVE METABOLIC PANEL
ALBUMIN: 2.7 g/dL — AB (ref 3.5–5.0)
ALK PHOS: 97 U/L (ref 38–126)
ALT: 11 U/L — AB (ref 14–54)
AST: 24 U/L (ref 15–41)
Anion gap: 18 — ABNORMAL HIGH (ref 5–15)
BUN: 44 mg/dL — ABNORMAL HIGH (ref 6–20)
CALCIUM: 8.6 mg/dL — AB (ref 8.9–10.3)
CHLORIDE: 92 mmol/L — AB (ref 101–111)
CO2: 18 mmol/L — AB (ref 22–32)
CREATININE: 9.35 mg/dL — AB (ref 0.44–1.00)
GFR calc non Af Amer: 4 mL/min — ABNORMAL LOW (ref >60)
GFR, EST AFRICAN AMERICAN: 5 mL/min — AB (ref >60)
GLUCOSE: 54 mg/dL — AB (ref 65–99)
Potassium: 6.6 mmol/L (ref 3.5–5.1)
SODIUM: 128 mmol/L — AB (ref 135–145)
Total Bilirubin: 1.3 mg/dL — ABNORMAL HIGH (ref 0.3–1.2)
Total Protein: 7 g/dL (ref 6.5–8.1)

## 2015-12-29 LAB — FERRITIN: FERRITIN: 670 ng/mL — AB (ref 11–307)

## 2015-12-29 LAB — FOLATE: Folate: 46.6 ng/mL (ref >5.9)

## 2015-12-29 LAB — RETICULOCYTES
RBC.: 4.13 MIL/uL (ref 3.87–5.11)
Retic Count, Absolute: 70.2 10*3/uL (ref 19.0–186.0)
Retic Ct Pct: 1.7 % (ref 0.4–3.1)

## 2015-12-29 LAB — VITAMIN B12: Vitamin B-12: 932 pg/mL — ABNORMAL HIGH (ref 180–914)

## 2015-12-29 MED ORDER — VANCOMYCIN HCL IN DEXTROSE 750-5 MG/150ML-% IV SOLN
750.0000 mg | INTRAVENOUS | Status: DC
  Administered 2015-12-29: 750 mg via INTRAVENOUS
  Filled 2015-12-29 (×2): qty 150

## 2015-12-29 MED ORDER — SODIUM CHLORIDE 0.9 % IV SOLN
125.0000 mg | INTRAVENOUS | Status: DC
  Filled 2015-12-29: qty 10

## 2015-12-29 MED ORDER — CALCIUM GLUCONATE 10 % IV SOLN
1.0000 g | Freq: Once | INTRAVENOUS | Status: DC
  Administered 2015-12-29: 1 g via INTRAVENOUS
  Filled 2015-12-29: qty 10

## 2015-12-29 MED ORDER — SODIUM CHLORIDE 0.9 % IV BOLUS (SEPSIS)
500.0000 mL | Freq: Once | INTRAVENOUS | Status: AC
  Administered 2015-12-29: 500 mL via INTRAVENOUS

## 2015-12-29 MED ORDER — LIDOCAINE-PRILOCAINE 2.5-2.5 % EX CREA: 1.0000 "application " | TOPICAL_CREAM | CUTANEOUS | Status: DC | PRN

## 2015-12-29 MED ORDER — SODIUM BICARBONATE 8.4 % IV SOLN
50.0000 meq | Freq: Once | INTRAVENOUS | Status: AC
  Administered 2015-12-29: 50 meq via INTRAVENOUS
  Filled 2015-12-29: qty 50

## 2015-12-29 MED ORDER — SODIUM CHLORIDE 0.9 % IV SOLN: 100.0000 mL | INTRAVENOUS | Status: DC | PRN

## 2015-12-29 MED ORDER — DEXTROSE 50 % IV SOLN
25.0000 mL | Freq: Once | INTRAVENOUS | Status: AC
  Administered 2015-12-29: 25 mL via INTRAVENOUS
  Filled 2015-12-29: qty 50

## 2015-12-29 MED ORDER — PENTAFLUOROPROP-TETRAFLUOROETH EX AERO: 1.0000 "application " | INHALATION_SPRAY | CUTANEOUS | Status: DC | PRN

## 2015-12-29 MED ORDER — SODIUM POLYSTYRENE SULFONATE 15 GM/60ML PO SUSP
30.0000 g | Freq: Once | ORAL | Status: AC
  Administered 2015-12-29: 30 g via ORAL
  Filled 2015-12-29: qty 120

## 2015-12-29 MED ORDER — HEPARIN SODIUM (PORCINE) 1000 UNIT/ML DIALYSIS: 20.0000 [IU]/kg | INTRAMUSCULAR | Status: DC | PRN

## 2015-12-29 MED ORDER — RENA-VITE PO TABS
1.0000 | ORAL_TABLET | Freq: Every day | ORAL | Status: DC
  Administered 2015-12-30 – 2015-12-31 (×2): 1 via ORAL
  Filled 2015-12-29 (×2): qty 1

## 2015-12-29 MED ORDER — HEPARIN SODIUM (PORCINE) 1000 UNIT/ML DIALYSIS: 1000.0000 [IU] | INTRAMUSCULAR | Status: DC | PRN

## 2015-12-29 MED ORDER — MIDODRINE HCL 5 MG PO TABS
5.0000 mg | ORAL_TABLET | Freq: Three times a day (TID) | ORAL | Status: DC
  Administered 2015-12-29 – 2015-12-31 (×7): 5 mg via ORAL
  Filled 2015-12-29 (×7): qty 1

## 2015-12-29 MED ORDER — INSULIN ASPART 100 UNIT/ML ~~LOC~~ SOLN
8.0000 [IU] | Freq: Once | SUBCUTANEOUS | Status: AC
  Administered 2015-12-29: 8 [IU] via SUBCUTANEOUS

## 2015-12-29 MED ORDER — ALTEPLASE 2 MG IJ SOLR: 2.0000 mg | Freq: Once | INTRAMUSCULAR | Status: DC | PRN

## 2015-12-29 MED ORDER — LIDOCAINE HCL (PF) 1 % IJ SOLN: 5.0000 mL | INTRAMUSCULAR | Status: DC | PRN

## 2015-12-29 MED ORDER — CALCITRIOL 0.5 MCG PO CAPS
1.2500 ug | ORAL_CAPSULE | ORAL | Status: DC
  Administered 2015-12-29 – 2016-01-01 (×2): 1.25 ug via ORAL
  Filled 2015-12-29 (×2): qty 5

## 2015-12-29 NOTE — Progress Notes (Signed)
[id number]  [name] [name] A&Ox4 BP 64/46, Pulse 113, had HD today 2.6L removed, pt states has history of this and usually needs fluids added back. Rapid response notified instructed to give next dose of midodrine now. Hospitalist and renal provders paged.

## 2015-12-29 NOTE — Progress Notes (Addendum)
Hemodialysis-Tx complete. Total UF 2.6L d/t increased hr last 15 minutes of tx. Is now back to baseline. Report called to primary RN. Unable to give iv iron d/t treatment time cut by 1 hour. Able to give vancomycin however.

## 2015-12-29 NOTE — Progress Notes (Signed)
Hemodialysis- with 15 minutes left on HD HR elevated into 130s sustained. Pt given saline and bp rechecked (92/47). UF off for remainder of treatment. Pt currently asymptomatic. Continue to monitor. 200cc saline given.

## 2015-12-29 NOTE — Progress Notes (Signed)
[facility] Progress Note  Subjective:  Feels ok.  No longer having abdominal pain.  No nausea or vomiting.  Passing flatus.  Tolerated clears and would like to eat.  Objective:  Vital signs in last 24 hours: Temp:  [98.4 F (36.9 C)-101.2 F (38.4 C)] 99.3 F (37.4 C) (02/25 0641) Pulse Rate:  [98-119] 106 (02/25 0641) Resp:  [16-23] 18 (02/25 0641) BP: (90-112)/(56-85) 95/66 mmHg (02/25 0641) SpO2:  [92 %-100 %] 92 % (02/25 0641) Weight:  [140 lb 3.2 oz (63.594 kg)] 140 lb 3.2 oz (63.594 kg) (02/24 1600) Last BM Date: 12/27/15 General:   Alert, Well-developed, in NAD Heart:  Slightly tachy. Pulm:  CTAB.  No W/R/R. Abdomen:  Soft, non-distended. Normal bowel sounds.  Non-tender. Extremities:  Without edema. Neurologic:  Alert and oriented x 4;  grossly normal neurologically. Psych:  Alert and cooperative. Normal mood and affect.  Intake/Output from previous day: 02/24 0701 - 02/25 0700 In: 356 [P.O.:250; I.V.:6; IV Piggyback:100] Out: -   Lab Results:  Recent Labs  12/27/15 2239 12/29/15 0644  WBC 8.7 7.6  HGB 14.3 11.4*  HCT 44.8 35.2*  PLT 195 129*   BMET  Recent Labs  12/27/15 2239 12/29/15 0644  NA 136 128*  K 5.1 6.6*  CL 93* 92*  CO2 21* 18*  GLUCOSE 164* 54*  BUN 29* 44*  CREATININE 6.71* 9.35*  CALCIUM 10.3 8.6*   LFT  Recent Labs  12/29/15 0644  PROT 7.0  ALBUMIN 2.7*  AST 24  ALT 11*  ALKPHOS 97  BILITOT 1.3*   Ct Abdomen Pelvis Wo Contrast  12/28/2015  CLINICAL DATA:  Acute onset of upper abdominal pain, radiating to the back. Nausea and vomiting. Diarrhea. Initial encounter. EXAM: CT ABDOMEN AND PELVIS WITHOUT CONTRAST TECHNIQUE: Multidetector CT imaging of the abdomen and pelvis was performed following the standard protocol without IV contrast. COMPARISON:  CT of the abdomen and pelvis performed 11/19/2014 FINDINGS: Mild left basilar scarring is noted. There is dilatation of the left hepatic duct and common bile  duct, measuring up to 1.1 cm. This raises concern for distal obstruction. The liver and spleen are otherwise unremarkable in appearance. The gallbladder is within normal limits. The pancreas and adrenal glands are unremarkable. A 2.0 cm left renal cyst is noted. Mild bilateral renal atrophy is noted. The kidneys are otherwise unremarkable. There is no evidence of hydronephrosis. No renal or ureteral stones are seen. No perinephric stranding is appreciated. No free fluid is identified. The small bowel is unremarkable in appearance. The stomach is within normal limits. No acute vascular abnormalities are seen. Scattered calcification is noted along the abdominal aorta and its branches. There is mild ectasia of the distal abdominal aorta. The appendix is normal in caliber, without evidence of appendicitis. The colon is unremarkable in appearance. The bladder is decompressed and not well assessed. The uterus is unremarkable in appearance. The ovaries are relatively symmetric. No suspicious adnexal masses are seen. No inguinal lymphadenopathy is seen. No acute osseous abnormalities are identified. IMPRESSION: 1. No acute abnormality seen within the abdomen or pelvis. 2. Dilatation of the left hepatic duct and common bile duct, measuring up to 1.1 cm. This raises concern for distal obstruction. Would correlate with LFTs. MRCP or ERCP could be considered for further evaluation. 3. Left renal cysts noted.  Mild bilateral renal atrophy seen. 4. Mild left basilar scarring noted. 5. Scattered calcification along the abdominal aorta and its branches. Electronically Signed   By:  Garald Balding M.D.   On: 12/28/2015 02:33   Mr Abdomen Mrcp Wo Cm  12/28/2015  CLINICAL DATA:  55 year old female with recent history of mid epigastric pain radiating to the back since 12/27/2015. No associated nausea or vomiting. Dilated left hepatic ducts noted on CT examination. Followup study. EXAM: MRI ABDOMEN WITHOUT CONTRAST  (INCLUDING MRCP)  TECHNIQUE: Multiplanar multisequence MR imaging of the abdomen was performed. Heavily T2-weighted images of the biliary and pancreatic ducts were obtained, and three-dimensional MRCP images were rendered by post processing. COMPARISON:  Noncontrast CT the abdomen and pelvis 12/28/2015. FINDINGS: Lower chest:  Unremarkable. Hepatobiliary: Diffuse low signal intensity throughout the liver on T2 weighted sequences, indicative of iron deposition. No definite cystic or solid hepatic lesions are confidently identified on today's noncontrast examination. MRCP images demonstrate some very mild intrahepatic biliary ductal dilatation throughout the left lobe of the liver. The left hepatic duct itself is markedly dilated 12 mm in diameter. Common hepatic duct is mildly dilated measuring 9 mm in the hepatic hilum. Common bile duct is also mildly dilated measuring up to 7 mm in the porta hepatis. There are no filling defects within the common bile duct to suggest retained ductal stones. Additionally, there are no filling defects within the gallbladder to suggest gallstones. Gallbladder is normal in appearance. Pancreas: No definite pancreatic mass identified on today's noncontrast examination. No pancreatic ductal dilatation. No pancreatic or peripancreatic fluid or inflammatory changes. Spleen: Diffuse low signal intensity throughout the spleen on T2 weighted images indicative of iron deposition. Adrenals/Urinary Tract: Bilateral adrenal glands are normal in appearance. 2 cm well-circumscribed T1 hypointense, T2 hyperintense lesion in the interpolar region of the left kidney is incompletely characterized on today's noncontrast CT examination, but is statistically likely a cyst. Right kidney is normal in appearance. No hydroureteronephrosis in the visualized abdomen. Stomach/Bowel: Visualized portions are unremarkable. Vascular/Lymphatic: No definite aneurysm identified in the abdominal vasculature. No lymphadenopathy noted in  the abdomen. Other: No significant volume of ascites in the visualized peritoneal cavity. Musculoskeletal: No aggressive osseous lesion noted in the visualized portions of the skeleton. IMPRESSION: 1. Dilatation of the left hepatic duct common common hepatic duct, and to a lesser extent the common bile duct. There is minimal associated left intrahepatic biliary ductal dilatation. These findings are unusual and are of uncertain etiology and significance, but could suggest the presence of a choledochal cyst. 2. Iron deposition in the liver and spleen. Electronically Signed   By: Vinnie Langton M.D.   On: 12/28/2015 13:20   Mr 3d Recon At Scanner  12/28/2015  CLINICAL DATA:  55 year old female with recent history of mid epigastric pain radiating to the back since 12/27/2015. No associated nausea or vomiting. Dilated left hepatic ducts noted on CT examination. Followup study. EXAM: MRI ABDOMEN WITHOUT CONTRAST  (INCLUDING MRCP) TECHNIQUE: Multiplanar multisequence MR imaging of the abdomen was performed. Heavily T2-weighted images of the biliary and pancreatic ducts were obtained, and three-dimensional MRCP images were rendered by post processing. COMPARISON:  Noncontrast CT the abdomen and pelvis 12/28/2015. FINDINGS: Lower chest:  Unremarkable. Hepatobiliary: Diffuse low signal intensity throughout the liver on T2 weighted sequences, indicative of iron deposition. No definite cystic or solid hepatic lesions are confidently identified on today's noncontrast examination. MRCP images demonstrate some very mild intrahepatic biliary ductal dilatation throughout the left lobe of the liver. The left hepatic duct itself is markedly dilated 12 mm in diameter. Common hepatic duct is mildly dilated measuring 9 mm in the hepatic hilum. Common bile  duct is also mildly dilated measuring up to 7 mm in the porta hepatis. There are no filling defects within the common bile duct to suggest retained ductal stones. Additionally,  there are no filling defects within the gallbladder to suggest gallstones. Gallbladder is normal in appearance. Pancreas: No definite pancreatic mass identified on today's noncontrast examination. No pancreatic ductal dilatation. No pancreatic or peripancreatic fluid or inflammatory changes. Spleen: Diffuse low signal intensity throughout the spleen on T2 weighted images indicative of iron deposition. Adrenals/Urinary Tract: Bilateral adrenal glands are normal in appearance. 2 cm well-circumscribed T1 hypointense, T2 hyperintense lesion in the interpolar region of the left kidney is incompletely characterized on today's noncontrast CT examination, but is statistically likely a cyst. Right kidney is normal in appearance. No hydroureteronephrosis in the visualized abdomen. Stomach/Bowel: Visualized portions are unremarkable. Vascular/Lymphatic: No definite aneurysm identified in the abdominal vasculature. No lymphadenopathy noted in the abdomen. Other: No significant volume of ascites in the visualized peritoneal cavity. Musculoskeletal: No aggressive osseous lesion noted in the visualized portions of the skeleton. IMPRESSION: 1. Dilatation of the left hepatic duct common common hepatic duct, and to a lesser extent the common bile duct. There is minimal associated left intrahepatic biliary ductal dilatation. These findings are unusual and are of uncertain etiology and significance, but could suggest the presence of a choledochal cyst. 2. Iron deposition in the liver and spleen. Electronically Signed   By: Vinnie Langton M.D.   On: 12/28/2015 13:20   Dg Chest Port 1 View  12/29/2015  CLINICAL DATA:  Shortness of breath for 2 days. EXAM: PORTABLE CHEST 1 VIEW COMPARISON:  12/21/2015 and prior chest radiographs FINDINGS: A left IJ central venous catheter is noted with tip overlying the lower SVC. The cardiomediastinal silhouette is unremarkable. Mild bibasilar atelectasis now identified. There is no evidence of  pulmonary edema, pneumothorax or large pleural effusion. IMPRESSION: Mild bibasilar atelectasis without other significant change. Electronically Signed   By: Margarette Canada M.D.   On: 12/29/2015 08:12    Assessment / Plan: *Acute epigastric/upper abd pain. CT with dilated CBD, left hepatic duct without obvious CBD stone or obstruction.  MRCP showed dilated left intrahepatic duct and mild dilation of the CBD. There are no gallstones on this study or mass lesions.  LFT's remain normal and she longer has abdominal pain.  *ESRD. She reports accelerated dialysis schedule this week.  *2/17 surgery for ligation of existing left arm fistula and placement of dialysis catheter and initial stage of right basilic vein fistula placement.   *Hx iron def and anemia due to CKD. Hgb currently ok.  *Hyperkalemia:  Potassium 6.6 this AM.  Being treated by primary service.  -Will advance diet to renal diet. -No plans for further inpatient evaluation from GI standpoint.  Needs non-urgent outpatient appt with Dr. Ardis Hughs to reassess and he would like to repeat an MRCP in a couple of months, which he will address.   LOS: 1 day   Stacie Knutzen D.  12/29/2015, 10:48 AM  Pager number [phone]

## 2015-12-29 NOTE — Progress Notes (Signed)
Patient Demographics:    [name] [name], is a 55 y.o. female, DOB - 11/10/1960, WUJ:[id number]  Admit date - 12/27/2015   Admitting Physician Clydie Braun, MD  Outpatient Primary MD for the patient is Abbe Amsterdam, MD  LOS - 1   Chief Complaint  Patient presents with  . Abdominal Pain  . Diarrhea        Subjective:    [name] [name] today has, No headache, No chest pain, No abdominal pain - No Nausea, No new weakness tingling or numbness, No Cough - SOB.    Assessment  & Plan :     1. Upper abdominal pain. No febrile. CT and MRI confirmed CBD, left hepatic and common hepatic duct dilation. Liver enzymes along with total bilirubin stable, no abdominal pain or tenderness this morning. Did spike a fever early morning 12/29/2015. Blood cultures obtained, currently on empiric antibiotics, GI following.  2. Fever early morning 12/29/2015. Blood cultures obtained and pending, Chest x-ray stable, ESRD patient who makes minimal to no urine, influenza panel ordered, currently on empiric antibiotics, will monitor clinically, she appears nontoxic. Denies any body aches.  3. ESRD. On Tuesday-Thursday-Saturday dialysis schedule. Renal informed.   4. History of lupus. Currently on plaquenil, continue, outpatient follow-up with rheumatology and ophthalmology as needed.   5. Iron deposits on liver and spleen noted on CT scan. Check iron panel. Stop any outpatient supplementation.    6. Prolonged QTC upon admission. Resolved.   7.GERD - on PPI   8. Chronic pain and neuropathy. On Lyrica continue along with as needed oxycodone.   7. Dyslipidemia. On statin.    Code Status : Full  Family Communication  : Family member bedside  Disposition Plan  : Stay inpatient  Consults  :  GI,  renal  Procedures  :   MRCP - CT Abd - dilation of left hepatic duct and common hepatic duct and CBD. And a positive and an liver and spleen. Left renal cyst.  DVT Prophylaxis  : Heparin    Lab Results  Component Value Date   PLT PENDING 12/29/2015    Inpatient Medications  Scheduled Meds: . Chlorhexidine Gluconate Cloth  6 each Topical Q0600  . cinacalcet  30 mg Oral Q breakfast  . heparin  5,000 Units Subcutaneous 3 times per day  . hydroxychloroquine  200 mg Oral BID  .  morphine injection  4 mg Intravenous Once  . multivitamin  1 tablet Oral Daily  . mupirocin ointment  1 application Nasal BID  . pantoprazole  40 mg Oral Daily  . piperacillin-tazobactam (ZOSYN)  IV  2.25 g Intravenous 3 times per day  . pravastatin  20 mg Oral Daily  . pregabalin  75 mg Oral Daily  . sevelamer carbonate  4.8 g Oral TID WC  . sodium chloride flush  3 mL Intravenous Q12H  . thiamine  100 mg Oral Daily  . vancomycin  750 mg Intravenous Q T,Th,Sa-HD   Continuous Infusions:  PRN Meds:.acetaminophen **OR** [DISCONTINUED] acetaminophen, albuterol, lidocaine-prilocaine, morphine injection, [DISCONTINUED] ondansetron **OR** ondansetron (ZOFRAN) IV, oxyCODONE-acetaminophen, sevelamer carbonate, temazepam  Antibiotics  :    Anti-infectives    Start     Dose/Rate Route Frequency Ordered Stop   12/29/15 1200  vancomycin (  VANCOCIN) IVPB 750 mg/150 ml premix     750 mg 150 mL/hr over 60 Minutes Intravenous Every T-Th-Sa (Hemodialysis) 12/28/15 2345     12/29/15 0000  vancomycin (VANCOCIN) 1,500 mg in sodium chloride 0.9 % 500 mL IVPB     1,500 mg 250 mL/hr over 120 Minutes Intravenous STAT 12/28/15 2345 12/29/15 0241   12/29/15 0000  piperacillin-tazobactam (ZOSYN) IVPB 2.25 g     2.25 g 100 mL/hr over 30 Minutes Intravenous 3 times per day 12/28/15 2345     12/28/15 1000  hydroxychloroquine (PLAQUENIL) tablet 200 mg     200 mg Oral 2 times daily 12/28/15 0508          Objective:   Filed  Vitals:   12/28/15 2232 12/29/15 0041 12/29/15 0200 12/29/15 0641  BP: 90/56   95/66  Pulse: 119   106  Temp: 101.2 F (38.4 C) 100.3 F (37.9 C) 99.9 F (37.7 C) 99.3 F (37.4 C)  TempSrc: Oral Oral Oral Oral  Resp: 16   18  Height:      Weight:      SpO2: 100%   92%    Wt Readings from Last 3 Encounters:  12/28/15 63.594 kg (140 lb 3.2 oz)  12/21/15 60.782 kg (134 lb)  12/12/15 60.782 kg (134 lb)     Intake/Output Summary (Last 24 hours) at 12/29/15 0947 Last data filed at 12/29/15 8469  Gross per 24 hour  Intake    356 ml  Output      0 ml  Net    356 ml     Physical Exam  Awake Alert, Oriented X 3, No new F.N deficits, Normal affect [place].AT,PERRAL Supple Neck,No JVD, No cervical lymphadenopathy appriciated.  Symmetrical Chest wall movement, Good air movement bilaterally, CTAB RRR,No Gallops,Rubs or new Murmurs, No Parasternal Heave +ve B.Sounds, Abd Soft, No tenderness, No organomegaly appriciated, No rebound - guarding or rigidity. No Cyanosis, Clubbing or edema, No new Rash or bruise      Data Review:   Micro Results Recent Results (from the past 240 hour(s))  MRSA PCR Screening     Status: Abnormal   Collection Time: 12/28/15  4:33 PM  Result Value Ref Range Status   MRSA by PCR POSITIVE (A) NEGATIVE Final    Comment:        The GeneXpert MRSA Assay (FDA approved for NASAL specimens only), is one component of a comprehensive MRSA colonization surveillance program. It is not intended to diagnose MRSA infection nor to guide or monitor treatment for MRSA infections. RESULT CALLED TO, READ BACK BY AND VERIFIED WITH: C.HANCOCK,RN AT 1913 BY L.PITT 12/28/15     Radiology Reports Ct Abdomen Pelvis Wo Contrast  12/28/2015  CLINICAL DATA:  Acute onset of upper abdominal pain, radiating to the back. Nausea and vomiting. Diarrhea. Initial encounter. EXAM: CT ABDOMEN AND PELVIS WITHOUT CONTRAST TECHNIQUE: Multidetector CT imaging of the abdomen and pelvis  was performed following the standard protocol without IV contrast. COMPARISON:  CT of the abdomen and pelvis performed 11/19/2014 FINDINGS: Mild left basilar scarring is noted. There is dilatation of the left hepatic duct and common bile duct, measuring up to 1.1 cm. This raises concern for distal obstruction. The liver and spleen are otherwise unremarkable in appearance. The gallbladder is within normal limits. The pancreas and adrenal glands are unremarkable. A 2.0 cm left renal cyst is noted. Mild bilateral renal atrophy is noted. The kidneys are otherwise unremarkable. There is no evidence of hydronephrosis.  No renal or ureteral stones are seen. No perinephric stranding is appreciated. No free fluid is identified. The small bowel is unremarkable in appearance. The stomach is within normal limits. No acute vascular abnormalities are seen. Scattered calcification is noted along the abdominal aorta and its branches. There is mild ectasia of the distal abdominal aorta. The appendix is normal in caliber, without evidence of appendicitis. The colon is unremarkable in appearance. The bladder is decompressed and not well assessed. The uterus is unremarkable in appearance. The ovaries are relatively symmetric. No suspicious adnexal masses are seen. No inguinal lymphadenopathy is seen. No acute osseous abnormalities are identified. IMPRESSION: 1. No acute abnormality seen within the abdomen or pelvis. 2. Dilatation of the left hepatic duct and common bile duct, measuring up to 1.1 cm. This raises concern for distal obstruction. Would correlate with LFTs. MRCP or ERCP could be considered for further evaluation. 3. Left renal cysts noted.  Mild bilateral renal atrophy seen. 4. Mild left basilar scarring noted. 5. Scattered calcification along the abdominal aorta and its branches. Electronically Signed   By: Roanna Raider M.D.   On: 12/28/2015 02:33   Mr Abdomen Mrcp Wo Cm  12/28/2015  CLINICAL DATA:  55 year old female  with recent history of mid epigastric pain radiating to the back since 12/27/2015. No associated nausea or vomiting. Dilated left hepatic ducts noted on CT examination. Followup study. EXAM: MRI ABDOMEN WITHOUT CONTRAST  (INCLUDING MRCP) TECHNIQUE: Multiplanar multisequence MR imaging of the abdomen was performed. Heavily T2-weighted images of the biliary and pancreatic ducts were obtained, and three-dimensional MRCP images were rendered by post processing. COMPARISON:  Noncontrast CT the abdomen and pelvis 12/28/2015. FINDINGS: Lower chest:  Unremarkable. Hepatobiliary: Diffuse low signal intensity throughout the liver on T2 weighted sequences, indicative of iron deposition. No definite cystic or solid hepatic lesions are confidently identified on today's noncontrast examination. MRCP images demonstrate some very mild intrahepatic biliary ductal dilatation throughout the left lobe of the liver. The left hepatic duct itself is markedly dilated 12 mm in diameter. Common hepatic duct is mildly dilated measuring 9 mm in the hepatic hilum. Common bile duct is also mildly dilated measuring up to 7 mm in the porta hepatis. There are no filling defects within the common bile duct to suggest retained ductal stones. Additionally, there are no filling defects within the gallbladder to suggest gallstones. Gallbladder is normal in appearance. Pancreas: No definite pancreatic mass identified on today's noncontrast examination. No pancreatic ductal dilatation. No pancreatic or peripancreatic fluid or inflammatory changes. Spleen: Diffuse low signal intensity throughout the spleen on T2 weighted images indicative of iron deposition. Adrenals/Urinary Tract: Bilateral adrenal glands are normal in appearance. 2 cm well-circumscribed T1 hypointense, T2 hyperintense lesion in the interpolar region of the left kidney is incompletely characterized on today's noncontrast CT examination, but is statistically likely a cyst. Right kidney is  normal in appearance. No hydroureteronephrosis in the visualized abdomen. Stomach/Bowel: Visualized portions are unremarkable. Vascular/Lymphatic: No definite aneurysm identified in the abdominal vasculature. No lymphadenopathy noted in the abdomen. Other: No significant volume of ascites in the visualized peritoneal cavity. Musculoskeletal: No aggressive osseous lesion noted in the visualized portions of the skeleton. IMPRESSION: 1. Dilatation of the left hepatic duct common common hepatic duct, and to a lesser extent the common bile duct. There is minimal associated left intrahepatic biliary ductal dilatation. These findings are unusual and are of uncertain etiology and significance, but could suggest the presence of a choledochal cyst. 2. Iron deposition in the  liver and spleen. Electronically Signed   By: Trudie Reed M.D.   On: 12/28/2015 13:20   Mr 3d Recon At Scanner  12/28/2015  CLINICAL DATA:  55 year old female with recent history of mid epigastric pain radiating to the back since 12/27/2015. No associated nausea or vomiting. Dilated left hepatic ducts noted on CT examination. Followup study. EXAM: MRI ABDOMEN WITHOUT CONTRAST  (INCLUDING MRCP) TECHNIQUE: Multiplanar multisequence MR imaging of the abdomen was performed. Heavily T2-weighted images of the biliary and pancreatic ducts were obtained, and three-dimensional MRCP images were rendered by post processing. COMPARISON:  Noncontrast CT the abdomen and pelvis 12/28/2015. FINDINGS: Lower chest:  Unremarkable. Hepatobiliary: Diffuse low signal intensity throughout the liver on T2 weighted sequences, indicative of iron deposition. No definite cystic or solid hepatic lesions are confidently identified on today's noncontrast examination. MRCP images demonstrate some very mild intrahepatic biliary ductal dilatation throughout the left lobe of the liver. The left hepatic duct itself is markedly dilated 12 mm in diameter. Common hepatic duct is mildly  dilated measuring 9 mm in the hepatic hilum. Common bile duct is also mildly dilated measuring up to 7 mm in the porta hepatis. There are no filling defects within the common bile duct to suggest retained ductal stones. Additionally, there are no filling defects within the gallbladder to suggest gallstones. Gallbladder is normal in appearance. Pancreas: No definite pancreatic mass identified on today's noncontrast examination. No pancreatic ductal dilatation. No pancreatic or peripancreatic fluid or inflammatory changes. Spleen: Diffuse low signal intensity throughout the spleen on T2 weighted images indicative of iron deposition. Adrenals/Urinary Tract: Bilateral adrenal glands are normal in appearance. 2 cm well-circumscribed T1 hypointense, T2 hyperintense lesion in the interpolar region of the left kidney is incompletely characterized on today's noncontrast CT examination, but is statistically likely a cyst. Right kidney is normal in appearance. No hydroureteronephrosis in the visualized abdomen. Stomach/Bowel: Visualized portions are unremarkable. Vascular/Lymphatic: No definite aneurysm identified in the abdominal vasculature. No lymphadenopathy noted in the abdomen. Other: No significant volume of ascites in the visualized peritoneal cavity. Musculoskeletal: No aggressive osseous lesion noted in the visualized portions of the skeleton. IMPRESSION: 1. Dilatation of the left hepatic duct common common hepatic duct, and to a lesser extent the common bile duct. There is minimal associated left intrahepatic biliary ductal dilatation. These findings are unusual and are of uncertain etiology and significance, but could suggest the presence of a choledochal cyst. 2. Iron deposition in the liver and spleen. Electronically Signed   By: Trudie Reed M.D.   On: 12/28/2015 13:20   Dg Chest Port 1 View  12/29/2015  CLINICAL DATA:  Shortness of breath for 2 days. EXAM: PORTABLE CHEST 1 VIEW COMPARISON:  12/21/2015  and prior chest radiographs FINDINGS: A left IJ central venous catheter is noted with tip overlying the lower SVC. The cardiomediastinal silhouette is unremarkable. Mild bibasilar atelectasis now identified. There is no evidence of pulmonary edema, pneumothorax or large pleural effusion. IMPRESSION: Mild bibasilar atelectasis without other significant change. Electronically Signed   By: Harmon Pier M.D.   On: 12/29/2015 08:12   Dg Chest Port 1v Same Day  12/21/2015  CLINICAL DATA:  Dialysis catheter placement. EXAM: PORTABLE CHEST 1 VIEW COMPARISON:  October 07, 2015. FINDINGS: The heart size and mediastinal contours are within normal limits. Left lung is clear. Minimal right basilar subsegmental atelectasis or scarring is noted. No pneumothorax or pleural effusion is noted. Right internal jugular dialysis catheter noted on prior exam has been replaced with  left internal jugular dialysis catheter, with distal tip in expected position of the SVC. The visualized skeletal structures are unremarkable. IMPRESSION: Placement of new left internal jugular dialysis catheter with distal tip in expected position of the SVC. No pneumothorax is noted. Electronically Signed   By: Lupita Raider, M.D.   On: 12/21/2015 13:07     CBC  Recent Labs Lab 12/27/15 2239 12/29/15 0644  WBC 8.7 7.6  HGB 14.3 11.4*  HCT 44.8 35.2*  PLT 195 PENDING  MCV 85.7 85.9  MCH 27.3 27.8  MCHC 31.9 32.4  RDW 17.5* 17.5*    Chemistries   Recent Labs Lab 12/27/15 2239  NA 136  K 5.1  CL 93*  CO2 21*  GLUCOSE 164*  BUN 29*  CREATININE 6.71*  CALCIUM 10.3  AST 25  ALT 15  ALKPHOS 104  BILITOT 1.1   ------------------------------------------------------------------------------------------------------------------ No results for input(s): CHOL, HDL, LDLCALC, TRIG, CHOLHDL, LDLDIRECT in the last 72 hours.  Lab Results  Component Value Date   HGBA1C 4.7 06/29/2015    ------------------------------------------------------------------------------------------------------------------ No results for input(s): TSH, T4TOTAL, T3FREE, THYROIDAB in the last 72 hours.  Invalid input(s): FREET3 ------------------------------------------------------------------------------------------------------------------ No results for input(s): VITAMINB12, FOLATE, FERRITIN, TIBC, IRON, RETICCTPCT in the last 72 hours.  Coagulation profile No results for input(s): INR, PROTIME in the last 168 hours.  No results for input(s): DDIMER in the last 72 hours.  Cardiac Enzymes No results for input(s): CKMB, TROPONINI, MYOGLOBIN in the last 168 hours.  Invalid input(s): CK ------------------------------------------------------------------------------------------------------------------ No results found for: BNP  Time Spent in minutes  35   Keitra Carusone K M.D on 12/29/2015 at 9:47 AM  Between 7am to 7pm - Pager - [phone]  After 7pm go to [email or web address] - password [facility]  [facility] -  Office  [phone]

## 2015-12-29 NOTE — Consult Note (Signed)
[facility] Renal Consultation Note    Indication for Consultation:  Management of ESRD/hemodialysis; anemia, hypertension/volume and secondary hyperparathyroidism PCP: Lamar Blinks, MD   HPI: [name] [name] is a 55 y.o. female with ESRD secondary to SLE on HD since approximately April 2016 who had the onset of abdominal pain 2/23 that worsened and progressed to nausea and vomiting without fever at dialysis 2/23.  She signed of HD early after two hours, went home and ultimately presented to the ED where abdominal CT showed dilatation of the left hepatic and common bile ducts.  MRCP confirmed the same without  without obvious stone or obstruction.  LFTS were normal and pain ultimately resolved. GI Recommends repeat MRCP in 3 months to assess for interval changes and repeat LFTs in 3-4 weeks.  Today she feels much better.  Her only complaint is her persistent coldness in hands and tingling of fingers since surgery 2/17 (new right BVT and ligation of left AVF). She missed her 2/24 appointment with [facility] for evaluation due to acute hospitalization. She denies cough, SOB, CP, N, V, D. She has chronic back pain and is prone to constipation and anxiety.  Past Medical History  Diagnosis Date  . Hypertension   . Lupus ([place])   . CKD (chronic kidney disease)     due to lupus/Dr. [name]  . Stenosis of cervical spine region     with HNP at C5/6, C6/7  . Metatarsal bone fracture right    4th  . 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 Arthritis     RA  . Anemia   . Stroke ([place]) 11/2014    left sided weakness, dysphagia  . Pain in joints   . Paresthesias   . Hyperlipidemia   . Colitis     ? per hospital notes 11/2014  . Lower GI bleed   . Hyponatremia   . Metabolic acidosis   . Hyperplastic colon polyp   . Hemorrhoids   . Blood transfusion without reported diagnosis   . Myocardial infarction ([place])   . Diabetes mellitus ([place])     border  line   Past Surgical History  Procedure Laterality Date  . Breast biopsy    . Tubal ligation    . Insertion of dialysis catheter Right 02/05/2015    Procedure: INSERTION OF DIALYSIS CATHETER - RIGHT INTERNAL JUGULAR;  Surgeon: Conrad Alexis, MD;  Location: [facility];  Service: Vascular;  Laterality: Right;  . Bascilic vein transposition Left 02/14/2015    Procedure: BASILIC VEIN Fistula Creation First Stage;  Surgeon: Rosetta Posner, MD;  Location: [facility];  Service: Vascular;  Laterality: Left;  . Colonoscopy w/ biopsies and polypectomy    . Bascilic vein transposition Left 03/28/2015    Procedure: LEFT ARM SECOND STAGE BASCILIC VEIN TRANSPOSITION;  Surgeon: Rosetta Posner, MD;  Location: [facility];  Service: Vascular;  Laterality: Left;  . Revison of arteriovenous fistula Left 09/26/2015    Procedure: Revision Left Basilic Vein Transposition with Banding using Gortex graft;  Surgeon: Mal Misty, MD;  Location: [place];  Service: Vascular;  Laterality: Left;  . Insertion of dialysis catheter N/A 10/05/2015    Procedure: INSERTION OF DIALYSIS CATHETER Right Internal Jugular.;  Surgeon: Mal Misty, MD;  Location: [facility] [place];  Service: Vascular;  Laterality: N/A;  . Peripheral vascular catheterization N/A 10/10/2015    Procedure: Nolon Stalls;  Surgeon: Butch Penny  Trula Slade, MD;  Location: [facility];  Service: Cardiovascular;  Laterality: N/A;  . Ligation of arteriovenous  fistula Left 12/21/2015    Procedure: LIGATION OF ARTERIOVENOUS  FISTULA LEFT ARM;  Surgeon: [name], MD;  Location: [place];  Service: Vascular;  Laterality: Left;  . Av fistula placement Right 12/21/2015    Procedure: ARTERIOVENOUS (AV) FISTULA CREATION RIGHT ARM;  Surgeon: [name], MD;  Location: [place];  Service: Vascular;  Laterality: Right;  . Insertion of dialysis catheter Left 12/21/2015    Procedure: INSERTION OF DIALYSIS CATHETER LEFT INTERNAL JUGULAR VEIN;  Surgeon: [name], MD;  [facility];  Service:  Vascular;  Laterality: Left;   Family History  Problem Relation Age of Onset  . Hypertension    . Lupus    . Rheum arthritis    . Hypertension Mother   . Diabetes Mother   . Hypertension Sister   . Diabetes Father   . Hypertension Maternal Grandmother   . Colon cancer Neg Hx    Social History:  reports that she quit smoking about 15 months ago. Her smoking use included Cigarettes. She has a 15 pack-year smoking history. She has never used smokeless tobacco. She reports that she does not drink alcohol or use illicit drugs. Allergies  Allergen Reactions  . Sulfa Antibiotics Itching   Prior to Admission medications   Medication Sig Start Date End Date Taking? Authorizing Provider  acetaminophen (TYLENOL) 325 MG tablet Take 2 tablets (650 mg total) by mouth every 4 (four) hours as needed for mild pain. 10/25/14  Yes Ivan Anchors Love, PA-C  cinacalcet (SENSIPAR) 30 MG tablet Take 30 mg by mouth daily.   Yes Historical Provider, MD  docusate sodium (COLACE) 50 MG capsule Take 50 mg by mouth 2 (two) times daily. Reported on 12/12/2015   Yes Historical Provider, MD  HYDROcodone-acetaminophen (NORCO) 7.5-325 MG tablet Take 1 tablet by mouth every 6 (six) hours as needed for severe pain. 12/21/15  Yes Ulyses Amor, PA-C  hydroxychloroquine (PLAQUENIL) 200 MG tablet Take 1 tablet (200 mg total) by mouth 2 (two) times daily. 07/02/15  Yes [name], MD  lidocaine-prilocaine (EMLA) cream Apply 1 application topically as needed.   Yes Historical Provider, MD  multivitamin (RENA-VIT) TABS tablet Take 1 tablet by mouth daily.   Yes Historical Provider, MD  oxyCODONE-acetaminophen (PERCOCET/ROXICET) 5-325 MG tablet Take 1 tablet by mouth every 6 (six) hours as needed for moderate pain. Reported on 12/12/2015 03/28/15  Yes Historical Provider, MD  pantoprazole (PROTONIX) 40 MG tablet Take 1 tablet (40 mg total) by mouth daily. 02/22/15  Yes Daniel J Angiulli, PA-C  pravastatin (PRAVACHOL) 20 MG tablet  Take 20 mg by mouth daily.   Yes Historical Provider, MD  pregabalin (LYRICA) 75 MG capsule Take 1 capsule (75 mg total) by mouth daily. 11/24/15  Yes Gay Filler Copland, MD  RENVELA 2.4 G PACK Take 2.4-4.8 g by mouth 3 (three) times daily with meals. 2 PACKS WITH MEALS 3 TIMES DAILY, 1 PACK WITH SNACKS 08/07/15  Yes Historical Provider, MD  temazepam (RESTORIL) 30 MG capsule Take 30 mg by mouth at bedtime as needed for sleep. Reported on 12/12/2015   Yes Historical Provider, MD  thiamine 100 MG tablet Take 1 tablet (100 mg total) by mouth daily. 02/22/15  Yes Daniel J Angiulli, PA-C  ALPRAZolam (XANAX) 0.25 MG tablet Take 1 tablet (0.25 mg total) by mouth every 8 (eight) hours as needed for  anxiety (0.25mg -0.5mg ). Patient not taking: Reported on 12/12/2015 02/22/15   Lavon Paganini Angiulli, PA-C   Current Facility-Administered Medications  Medication Dose Route Frequency Provider Last Rate Last Dose  . acetaminophen (TYLENOL) tablet 650 mg  650 mg Oral Q6H PRN Norval Morton, MD   650 mg at 12/28/15 2247  . albuterol (PROVENTIL) (2.5 MG/3ML) 0.083% nebulizer solution 2.5 mg  2.5 mg Nebulization Q2H PRN Rondell A Tamala Julian, MD      . calcitRIOL (ROCALTROL) capsule 1.25 mcg  1.25 mcg Oral Q T,Th,Sa-HD Alric Seton, PA-C   1.25 mcg at 12/29/15 1241  . Chlorhexidine Gluconate Cloth 2 % PADS 6 each  6 each Topical Q0600 Annita Brod, MD   6 each at 12/29/15 [phone]  . cinacalcet (SENSIPAR) tablet 30 mg  30 mg Oral Q breakfast Rondell A Tamala Julian, MD   30 mg at 12/29/15 [id number]  . heparin injection 5,000 Units  5,000 Units Subcutaneous 3 times per day Norval Morton, MD   5,000 Units at 12/29/15 [id number]  . hydroxychloroquine (PLAQUENIL) tablet 200 mg  200 mg Oral BID Norval Morton, MD   200 mg at 12/29/15 0836  . morphine 2 MG/ML injection 2 mg  2 mg Intravenous Q2H PRN Norval Morton, MD   2 mg at 12/28/15 1809  . morphine 4 MG/ML injection 4 mg  4 mg Intravenous Once Julianne Rice, MD   4 mg at 12/28/15 0347  .  [START ON 12/30/2015] multivitamin (RENA-VIT) tablet 1 tablet  1 tablet Oral QHS Thurnell Lose, MD      . mupirocin ointment (BACTROBAN) 2 % 1 application  1 application Nasal BID Annita Brod, MD   1 application at [id number] [phone]  . ondansetron (ZOFRAN) injection 4 mg  4 mg Intravenous Q6H PRN Norval Morton, MD      . oxyCODONE-acetaminophen (PERCOCET/ROXICET) 5-325 MG per tablet 1 tablet  1 tablet Oral Q6H PRN Norval Morton, MD   1 tablet at 12/29/15 1130  . pantoprazole (PROTONIX) EC tablet 40 mg  40 mg Oral Daily Norval Morton, MD   40 mg at 12/29/15 0836  . piperacillin-tazobactam (ZOSYN) IVPB 2.25 g  2.25 g Intravenous 3 times per day Franky Macho, RPH   2.25 g at 12/29/15 [id number]  . pravastatin (PRAVACHOL) tablet 20 mg  20 mg Oral Daily Norval Morton, MD   20 mg at 12/29/15 0836  . pregabalin (LYRICA) capsule 75 mg  75 mg Oral Daily Norval Morton, MD   75 mg at 12/29/15 0835  . sevelamer carbonate (RENVELA) powder PACK 2.4 g  2.4 g Oral PRN Norval Morton, MD      . sevelamer carbonate (RENVELA) powder PACK 4.8 g  4.8 g Oral TID WC Rondell Charmayne Sheer, MD   4.8 g at 12/29/15 0835  . sodium chloride flush (NS) 0.9 % injection 3 mL  3 mL Intravenous Q12H Norval Morton, MD   3 mL at 12/28/15 2118  . temazepam (RESTORIL) capsule 30 mg  30 mg Oral QHS PRN Norval Morton, MD      . thiamine (VITAMIN B-1) tablet 100 mg  100 mg Oral Daily Norval Morton, MD   100 mg at 12/29/15 0836  . vancomycin (VANCOCIN) IVPB 750 mg/150 ml premix  750 mg Intravenous Q T,Th,Sa-HD Rolla Flatten, [facility]       Labs: Basic Metabolic Panel:  Recent Labs Lab 12/27/15 2239 12/29/15 0644  NA  136 128*  K 5.1 6.6*  CL 93* 92*  CO2 21* 18*  GLUCOSE 164* 54*  BUN 29* 44*  CREATININE 6.71* 9.35*  CALCIUM 10.3 8.6*   Liver Function Tests:  Recent Labs Lab 12/27/15 2239 12/29/15 0644  AST 25 24  ALT 15 11*  ALKPHOS 104 97  BILITOT 1.1 1.3*  PROT 10.3* 7.0  ALBUMIN 3.7 2.7*    Recent  Labs Lab 12/27/15 2239  LIPASE 34   CBC:  Recent Labs Lab 12/27/15 2239 12/29/15 0644  WBC 8.7 7.6  HGB 14.3 11.4*  HCT 44.8 35.2*  MCV 85.7 85.9  PLT 195 129*  Iron Studies:  Recent Labs  12/29/15 1012  IRON 14*  TIBC 182*  FERRITIN 670*   Studies/Results: Ct Abdomen Pelvis Wo Contrast  12/28/2015  CLINICAL DATA:  Acute onset of upper abdominal pain, radiating to the back. Nausea and vomiting. Diarrhea. Initial encounter. EXAM: CT ABDOMEN AND PELVIS WITHOUT CONTRAST TECHNIQUE: Multidetector CT imaging of the abdomen and pelvis was performed following the standard protocol without IV contrast. COMPARISON:  CT of the abdomen and pelvis performed 11/19/2014 FINDINGS: Mild left basilar scarring is noted. There is dilatation of the left hepatic duct and common bile duct, measuring up to 1.1 cm. This raises concern for distal obstruction. The liver and spleen are otherwise unremarkable in appearance. The gallbladder is within normal limits. The pancreas and adrenal glands are unremarkable. A 2.0 cm left renal cyst is noted. Mild bilateral renal atrophy is noted. The kidneys are otherwise unremarkable. There is no evidence of hydronephrosis. No renal or ureteral stones are seen. No perinephric stranding is appreciated. No free fluid is identified. The small bowel is unremarkable in appearance. The stomach is within normal limits. No acute vascular abnormalities are seen. Scattered calcification is noted along the abdominal aorta and its branches. There is mild ectasia of the distal abdominal aorta. The appendix is normal in caliber, without evidence of appendicitis. The colon is unremarkable in appearance. The bladder is decompressed and not well assessed. The uterus is unremarkable in appearance. The ovaries are relatively symmetric. No suspicious adnexal masses are seen. No inguinal lymphadenopathy is seen. No acute osseous abnormalities are identified. IMPRESSION: 1. No acute abnormality seen  within the abdomen or pelvis. 2. Dilatation of the left hepatic duct and common bile duct, measuring up to 1.1 cm. This raises concern for distal obstruction. Would correlate with LFTs. MRCP or ERCP could be considered for further evaluation. 3. Left renal cysts noted.  Mild bilateral renal atrophy seen. 4. Mild left basilar scarring noted. 5. Scattered calcification along the abdominal aorta and its branches. Electronically Signed   By: Garald Balding M.D.   On: 12/28/2015 02:33   Mr Abdomen Mrcp Wo Cm  12/28/2015  CLINICAL DATA:  55 year old female with recent history of mid epigastric pain radiating to the back since 12/27/2015. No associated nausea or vomiting. Dilated left hepatic ducts noted on CT examination. Followup study. EXAM: MRI ABDOMEN WITHOUT CONTRAST  (INCLUDING MRCP) TECHNIQUE: Multiplanar multisequence MR imaging of the abdomen was performed. Heavily T2-weighted images of the biliary and pancreatic ducts were obtained, and three-dimensional MRCP images were rendered by post processing. COMPARISON:  Noncontrast CT the abdomen and pelvis 12/28/2015. FINDINGS: Lower chest:  Unremarkable. Hepatobiliary: Diffuse low signal intensity throughout the liver on T2 weighted sequences, indicative of iron deposition. No definite cystic or solid hepatic lesions are confidently identified on today's noncontrast examination. MRCP images demonstrate some very mild intrahepatic biliary ductal  dilatation throughout the left lobe of the liver. The left hepatic duct itself is markedly dilated 12 mm in diameter. Common hepatic duct is mildly dilated measuring 9 mm in the hepatic hilum. Common bile duct is also mildly dilated measuring up to 7 mm in the porta hepatis. There are no filling defects within the common bile duct to suggest retained ductal stones. Additionally, there are no filling defects within the gallbladder to suggest gallstones. Gallbladder is normal in appearance. Pancreas: No definite pancreatic  mass identified on today's noncontrast examination. No pancreatic ductal dilatation. No pancreatic or peripancreatic fluid or inflammatory changes. Spleen: Diffuse low signal intensity throughout the spleen on T2 weighted images indicative of iron deposition. Adrenals/Urinary Tract: Bilateral adrenal glands are normal in appearance. 2 cm well-circumscribed T1 hypointense, T2 hyperintense lesion in the interpolar region of the left kidney is incompletely characterized on today's noncontrast CT examination, but is statistically likely a cyst. Right kidney is normal in appearance. No hydroureteronephrosis in the visualized abdomen. Stomach/Bowel: Visualized portions are unremarkable. Vascular/Lymphatic: No definite aneurysm identified in the abdominal vasculature. No lymphadenopathy noted in the abdomen. Other: No significant volume of ascites in the visualized peritoneal cavity. Musculoskeletal: No aggressive osseous lesion noted in the visualized portions of the skeleton. IMPRESSION: 1. Dilatation of the left hepatic duct common common hepatic duct, and to a lesser extent the common bile duct. There is minimal associated left intrahepatic biliary ductal dilatation. These findings are unusual and are of uncertain etiology and significance, but could suggest the presence of a choledochal cyst. 2. Iron deposition in the liver and spleen. Electronically Signed   By: Vinnie Langton M.D.   On: 12/28/2015 13:20   Mr 3d Recon At Scanner  12/28/2015  CLINICAL DATA:  55 year old female with recent history of mid epigastric pain radiating to the back since 12/27/2015. No associated nausea or vomiting. Dilated left hepatic ducts noted on CT examination. Followup study. EXAM: MRI ABDOMEN WITHOUT CONTRAST  (INCLUDING MRCP) TECHNIQUE: Multiplanar multisequence MR imaging of the abdomen was performed. Heavily T2-weighted images of the biliary and pancreatic ducts were obtained, and three-dimensional MRCP images were rendered by  post processing. COMPARISON:  Noncontrast CT the abdomen and pelvis 12/28/2015. FINDINGS: Lower chest:  Unremarkable. Hepatobiliary: Diffuse low signal intensity throughout the liver on T2 weighted sequences, indicative of iron deposition. No definite cystic or solid hepatic lesions are confidently identified on today's noncontrast examination. MRCP images demonstrate some very mild intrahepatic biliary ductal dilatation throughout the left lobe of the liver. The left hepatic duct itself is markedly dilated 12 mm in diameter. Common hepatic duct is mildly dilated measuring 9 mm in the hepatic hilum. Common bile duct is also mildly dilated measuring up to 7 mm in the porta hepatis. There are no filling defects within the common bile duct to suggest retained ductal stones. Additionally, there are no filling defects within the gallbladder to suggest gallstones. Gallbladder is normal in appearance. Pancreas: No definite pancreatic mass identified on today's noncontrast examination. No pancreatic ductal dilatation. No pancreatic or peripancreatic fluid or inflammatory changes. Spleen: Diffuse low signal intensity throughout the spleen on T2 weighted images indicative of iron deposition. Adrenals/Urinary Tract: Bilateral adrenal glands are normal in appearance. 2 cm well-circumscribed T1 hypointense, T2 hyperintense lesion in the interpolar region of the left kidney is incompletely characterized on today's noncontrast CT examination, but is statistically likely a cyst. Right kidney is normal in appearance. No hydroureteronephrosis in the visualized abdomen. Stomach/Bowel: Visualized portions are unremarkable. Vascular/Lymphatic: No definite  aneurysm identified in the abdominal vasculature. No lymphadenopathy noted in the abdomen. Other: No significant volume of ascites in the visualized peritoneal cavity. Musculoskeletal: No aggressive osseous lesion noted in the visualized portions of the skeleton. IMPRESSION: 1.  Dilatation of the left hepatic duct common common hepatic duct, and to a lesser extent the common bile duct. There is minimal associated left intrahepatic biliary ductal dilatation. These findings are unusual and are of uncertain etiology and significance, but could suggest the presence of a choledochal cyst. 2. Iron deposition in the liver and spleen. Electronically Signed   By: Vinnie Langton M.D.   On: 12/28/2015 13:20   Dg Chest Port 1 View  12/29/2015  CLINICAL DATA:  Shortness of breath for 2 days. EXAM: PORTABLE CHEST 1 VIEW COMPARISON:  12/21/2015 and prior chest radiographs FINDINGS: A left IJ central venous catheter is noted with tip overlying the lower SVC. The cardiomediastinal silhouette is unremarkable. Mild bibasilar atelectasis now identified. There is no evidence of pulmonary edema, pneumothorax or large pleural effusion. IMPRESSION: Mild bibasilar atelectasis without other significant change. Electronically Signed   By: Margarette Canada M.D.   On: 12/29/2015 08:12    ROS: As per HPI otherwise negative.  Physical Exam: Filed Vitals:   12/29/15 0641 12/29/15 1301 12/29/15 1306 12/29/15 1330  BP: 95/66 94/54 95/55  96/48  Pulse: 106 105 98 98  Temp: 99.3 F (37.4 C) 99.4 F (37.4 C)    TempSrc: Oral Oral    Resp: 18 19 14    Height:      Weight:  65.2 kg (143 lb 11.8 oz)    SpO2: 92% 93%       General: slender AAF in no acute distress. Head: Normocephalic, atraumatic, sclera non-icteric, mucus membranes are moist Neck: Supple. JVD not elevated. Lungs: Clear bilaterally to auscultation without wheezes, rales, or rhonchi. Breathing is unlabored. Heart:  Tachy reg low 100s Abdomen: Soft, non-tender, non-distended with normoactive bowel sounds.  Lower extremities:without edema or ischemic changes, no open wounds  Neuro: Alert and oriented X 3. Moves all extremities spontaneously. Psych:  Responds to questions appropriately with a normal affect. Dialysis Access:right upper AVF 1st  stage BKV 2/17 (right hand slightly cooler than left and left IJ  Dialysis Orders:  [facility] 4.25 hours 300/800 right IJ heparin 5400 2 K 2 Ca venofer 50/wk on thursdays, Mircera 50 q 2 weeks last 2/23 calcitriol 1.25.  EDW just rasied to 61 from 59.5 on 3/23 - post HD wt was 61.4 on 2/23 Recent labs:  hgb 13.5 up from 11.5 - 27% sat Ca/ P both 8 iPTH 857  Assessment/Plan: 1.  Abdominal pain- MRCP -CBD and hepatic duct dilitation without obvious obstruction/mass/stone- per GI 2. Fever spike to 101.2 late 2/24; cultures drawn; no change in WBC- on Vanc and Zosyn 3. Hyperkalemia K 6.6 - only had two hours of HD Thursday due to N and V- plan 2 hr 1 K then 2 K the remainder of treatment; recheck BMP in am 4.  ESRD -  TTS - time shortened today to 3 hr due to high patient volume makes it harder to get volume off and K down - repeat labs in am; Not clear why her BFR is at 300 - we are running at 400 today and this needs to be increased at discharge.  Appt to deal with Issues with access partial steal symptoms can be rescheduled if d/c soon or VVS can be consulted Monday if still here 5.  Hypotension/volume  -  volume up by weights and Na low; set goal to 3 L today - due to low BP;  6.  Anemia  - Hgb 11.4 - consistent with outpt Hgb - last outpt tsat was 27% now 8% with ferritin 670 - needs repletion- has been on 50 per week - probably can hold Mircera and just repelte Fe for now 7.  Metabolic bone disease -  Continue calcitriol, binders, sensipar  8.  Nutrition - renal carb mod diet/vit 9. SLE - on plaquenil  Myriam Jacobson, PA-C [place] [phone] 12/29/2015, 1:52 PM   Pt seen, examined, agree w assess/plan as above with additions as indicated. ESRD patient with hx of SLE admitted with N/V and fevers at last HD.  Blood cx's are pending.  She does have an indwelling HD cath and this could be catheter-related sepsis. Would keep here until blood cx's are back.  On empiric abx.  Kelly Splinter MD [employer] pager [phone]    cell [phone] 12/29/2015, 2:33 PM

## 2015-12-30 ENCOUNTER — Observation Stay (HOSPITAL_COMMUNITY): Payer: Managed Care, Other (non HMO)

## 2015-12-30 DIAGNOSIS — I132 Hypertensive heart and chronic kidney disease with heart failure and with stage 5 chronic kidney disease, or end stage renal disease: Secondary | ICD-10-CM | POA: Diagnosis present

## 2015-12-30 DIAGNOSIS — E785 Hyperlipidemia, unspecified: Secondary | ICD-10-CM | POA: Diagnosis present

## 2015-12-30 DIAGNOSIS — E43 Unspecified severe protein-calorie malnutrition: Secondary | ICD-10-CM | POA: Diagnosis present

## 2015-12-30 DIAGNOSIS — E1140 Type 2 diabetes mellitus with diabetic neuropathy, unspecified: Secondary | ICD-10-CM | POA: Diagnosis present

## 2015-12-30 DIAGNOSIS — B349 Viral infection, unspecified: Secondary | ICD-10-CM | POA: Diagnosis not present

## 2015-12-30 DIAGNOSIS — Z6823 Body mass index (BMI) 23.0-23.9, adult: Secondary | ICD-10-CM | POA: Diagnosis not present

## 2015-12-30 DIAGNOSIS — M329 Systemic lupus erythematosus, unspecified: Secondary | ICD-10-CM | POA: Diagnosis present

## 2015-12-30 DIAGNOSIS — Z992 Dependence on renal dialysis: Secondary | ICD-10-CM | POA: Diagnosis not present

## 2015-12-30 DIAGNOSIS — R1011 Right upper quadrant pain: Secondary | ICD-10-CM | POA: Diagnosis present

## 2015-12-30 DIAGNOSIS — Z8249 Family history of ischemic heart disease and other diseases of the circulatory system: Secondary | ICD-10-CM | POA: Diagnosis not present

## 2015-12-30 DIAGNOSIS — R1013 Epigastric pain: Secondary | ICD-10-CM | POA: Diagnosis not present

## 2015-12-30 DIAGNOSIS — Z79899 Other long term (current) drug therapy: Secondary | ICD-10-CM | POA: Diagnosis not present

## 2015-12-30 DIAGNOSIS — R000 Tachycardia, unspecified: Secondary | ICD-10-CM | POA: Diagnosis present

## 2015-12-30 DIAGNOSIS — Z833 Family history of diabetes mellitus: Secondary | ICD-10-CM | POA: Diagnosis not present

## 2015-12-30 DIAGNOSIS — I4581 Long QT syndrome: Secondary | ICD-10-CM | POA: Diagnosis present

## 2015-12-30 DIAGNOSIS — Z87891 Personal history of nicotine dependence: Secondary | ICD-10-CM | POA: Diagnosis not present

## 2015-12-30 DIAGNOSIS — N186 End stage renal disease: Secondary | ICD-10-CM | POA: Diagnosis present

## 2015-12-30 DIAGNOSIS — K838 Other specified diseases of biliary tract: Secondary | ICD-10-CM | POA: Diagnosis present

## 2015-12-30 DIAGNOSIS — M069 Rheumatoid arthritis, unspecified: Secondary | ICD-10-CM | POA: Diagnosis present

## 2015-12-30 DIAGNOSIS — E875 Hyperkalemia: Secondary | ICD-10-CM | POA: Diagnosis not present

## 2015-12-30 DIAGNOSIS — I5030 Unspecified diastolic (congestive) heart failure: Secondary | ICD-10-CM | POA: Diagnosis present

## 2015-12-30 DIAGNOSIS — E1122 Type 2 diabetes mellitus with diabetic chronic kidney disease: Secondary | ICD-10-CM | POA: Diagnosis present

## 2015-12-30 DIAGNOSIS — I252 Old myocardial infarction: Secondary | ICD-10-CM | POA: Diagnosis not present

## 2015-12-30 DIAGNOSIS — R197 Diarrhea, unspecified: Secondary | ICD-10-CM | POA: Diagnosis present

## 2015-12-30 DIAGNOSIS — I959 Hypotension, unspecified: Secondary | ICD-10-CM | POA: Diagnosis present

## 2015-12-30 DIAGNOSIS — Z8673 Personal history of transient ischemic attack (TIA), and cerebral infarction without residual deficits: Secondary | ICD-10-CM | POA: Diagnosis not present

## 2015-12-30 DIAGNOSIS — K219 Gastro-esophageal reflux disease without esophagitis: Secondary | ICD-10-CM | POA: Diagnosis present

## 2015-12-30 DIAGNOSIS — N2581 Secondary hyperparathyroidism of renal origin: Secondary | ICD-10-CM | POA: Diagnosis present

## 2015-12-30 DIAGNOSIS — Z882 Allergy status to sulfonamides status: Secondary | ICD-10-CM | POA: Diagnosis not present

## 2015-12-30 DIAGNOSIS — M899 Disorder of bone, unspecified: Secondary | ICD-10-CM | POA: Diagnosis present

## 2015-12-30 LAB — COMPREHENSIVE METABOLIC PANEL
ALK PHOS: 82 U/L (ref 38–126)
ALT: 12 U/L — AB (ref 14–54)
ANION GAP: 18 — AB (ref 5–15)
AST: 25 U/L (ref 15–41)
Albumin: 2.2 g/dL — ABNORMAL LOW (ref 3.5–5.0)
BILIRUBIN TOTAL: 1 mg/dL (ref 0.3–1.2)
BUN: 24 mg/dL — ABNORMAL HIGH (ref 6–20)
CALCIUM: 8.6 mg/dL — AB (ref 8.9–10.3)
CO2: 20 mmol/L — AB (ref 22–32)
Chloride: 100 mmol/L — ABNORMAL LOW (ref 101–111)
Creatinine, Ser: 6.85 mg/dL — ABNORMAL HIGH (ref 0.44–1.00)
GFR, EST AFRICAN AMERICAN: 7 mL/min — AB (ref >60)
GFR, EST NON AFRICAN AMERICAN: 6 mL/min — AB (ref >60)
Glucose, Bld: 72 mg/dL (ref 65–99)
Potassium: 4.7 mmol/L (ref 3.5–5.1)
Sodium: 138 mmol/L (ref 135–145)
TOTAL PROTEIN: 6.6 g/dL (ref 6.5–8.1)

## 2015-12-30 LAB — BASIC METABOLIC PANEL
ANION GAP: 17 — AB (ref 5–15)
BUN: 23 mg/dL — AB (ref 6–20)
CHLORIDE: 97 mmol/L — AB (ref 101–111)
CO2: 22 mmol/L (ref 22–32)
Calcium: 8.4 mg/dL — ABNORMAL LOW (ref 8.9–10.3)
Creatinine, Ser: 6.49 mg/dL — ABNORMAL HIGH (ref 0.44–1.00)
GFR calc Af Amer: 8 mL/min — ABNORMAL LOW (ref >60)
GFR, EST NON AFRICAN AMERICAN: 7 mL/min — AB (ref >60)
GLUCOSE: 94 mg/dL (ref 65–99)
POTASSIUM: 4.4 mmol/L (ref 3.5–5.1)
SODIUM: 136 mmol/L (ref 135–145)

## 2015-12-30 LAB — INFLUENZA PANEL BY PCR (TYPE A & B)
H1N1 flu by pcr: NOT DETECTED
INFLAPCR: NEGATIVE
INFLBPCR: NEGATIVE

## 2015-12-30 LAB — LACTIC ACID, PLASMA
LACTIC ACID, VENOUS: 0.8 mmol/L (ref 0.5–2.0)
LACTIC ACID, VENOUS: 0.9 mmol/L (ref 0.5–2.0)

## 2015-12-30 LAB — CBC
HCT: 31 % — ABNORMAL LOW (ref 36.0–46.0)
HEMATOCRIT: 32.5 % — AB (ref 36.0–46.0)
HEMOGLOBIN: 10.2 g/dL — AB (ref 12.0–15.0)
Hemoglobin: 9.9 g/dL — ABNORMAL LOW (ref 12.0–15.0)
MCH: 26.6 pg (ref 26.0–34.0)
MCH: 26.9 pg (ref 26.0–34.0)
MCHC: 31.4 g/dL (ref 30.0–36.0)
MCHC: 31.9 g/dL (ref 30.0–36.0)
MCV: 84.2 fL (ref 78.0–100.0)
MCV: 84.9 fL (ref 78.0–100.0)
PLATELETS: 125 10*3/uL — AB (ref 150–400)
Platelets: 141 10*3/uL — ABNORMAL LOW (ref 150–400)
RBC: 3.83 MIL/uL — ABNORMAL LOW (ref 3.87–5.11)
RBC: 3.68 MIL/uL — AB (ref 3.87–5.11)
RDW: 17.5 % — AB (ref 11.5–15.5)
RDW: 17.6 % — ABNORMAL HIGH (ref 11.5–15.5)
WBC: 8.3 10*3/uL (ref 4.0–10.5)
WBC: 9.2 10*3/uL (ref 4.0–10.5)

## 2015-12-30 LAB — TRANSFERRIN: TRANSFERRIN: 110 mg/dL — AB (ref 192–382)

## 2015-12-30 LAB — GLUCOSE, CAPILLARY
GLUCOSE-CAPILLARY: 109 mg/dL — AB (ref 65–99)
Glucose-Capillary: 69 mg/dL (ref 65–99)

## 2015-12-30 MED ORDER — TECHNETIUM TC 99M MEBROFENIN IV KIT
5.3900 | PACK | Freq: Once | INTRAVENOUS | Status: AC | PRN
  Administered 2015-12-30: 5 via INTRAVENOUS

## 2015-12-30 MED ORDER — MORPHINE SULFATE (PF) 4 MG/ML IV SOLN
INTRAVENOUS | Status: AC
  Administered 2015-12-30: 2.5 mg via INTRAVENOUS
  Filled 2015-12-30: qty 1

## 2015-12-30 MED ORDER — MORPHINE SULFATE (PF) 4 MG/ML IV SOLN
2.5000 mg | Freq: Once | INTRAVENOUS | Status: AC
  Administered 2015-12-30: 2.5 mg via INTRAVENOUS

## 2015-12-30 NOTE — Progress Notes (Signed)
Patient Demographics:    [name] [name], is a 55 y.o. female, DOB - February 07, 1961, [id number]  Admit date - 12/27/2015   Admitting Physician Norval Morton, MD  Outpatient Primary MD for the patient is Lamar Blinks, MD  LOS - 2   Chief Complaint  Patient presents with  . Abdominal Pain  . Diarrhea        Subjective:    [name] [name] today has, No headache, No chest pain, No abdominal pain - No Nausea, No new weakness tingling or numbness, No Cough - SOB.    Assessment  & Plan :     1. Upper abdominal pain. No febrile. CT and MRI confirmed CBD, left hepatic and common hepatic duct dilation. Liver enzymes along with total bilirubin stable, no abdominal pain but mild RUQ tenderness this morning. Did spike a fever early morning 12/29/2015. Blood cultures obtained, currently on empiric antibiotics, GI was on board, will get HIDA stat.  2. Fever early morning 12/29/2015. Blood cultures obtained and pending, Chest x-ray stable, ESRD patient who makes minimal to no urine, influenza panel ordered, currently on empiric antibiotics, will monitor clinically, she appears nontoxic. Denies any body aches.   3. ESRD. On Tuesday-Thursday-Saturday dialysis schedule. Renal informed.   4. History of lupus. Currently on plaquenil, continue, outpatient follow-up with rheumatology and ophthalmology as needed.   5. Iron deposits on liver and spleen noted on CT scan. Ferritin 670, check Transferrin (45). Stop Iron supplementation. Repeat iron Panel in 4 weeks.   6. Prolonged QTC upon admission. Resolved.   7.GERD - on PPI   8. Chronic pain and neuropathy. On Lyrica continue along with as needed oxycodone.   7. Dyslipidemia. On statin.    Code Status : Full  Family Communication  : Family member  bedside  Disposition Plan  : Stay inpatient  Consults  :  GI, renal, D/W [facility] Dr Molli Posey 12-30-15  Procedures  :   MRCP - CT Abd - dilation of left hepatic duct and common hepatic duct and CBD. And a positive and an liver and spleen. Left renal cyst.  HIDA  DVT Prophylaxis  : Heparin    Lab Results  Component Value Date   PLT 125* 12/30/2015    Inpatient Medications  Scheduled Meds: . calcitRIOL  1.25 mcg Oral Q T,Th,Sa-HD  . Chlorhexidine Gluconate Cloth  6 each Topical Q0600  . cinacalcet  30 mg Oral Q breakfast  . heparin  5,000 Units Subcutaneous 3 times per day  . hydroxychloroquine  200 mg Oral BID  . midodrine  5 mg Oral TID WC  . multivitamin  1 tablet Oral QHS  . mupirocin ointment  1 application Nasal BID  . pantoprazole  40 mg Oral Daily  . piperacillin-tazobactam (ZOSYN)  IV  2.25 g Intravenous 3 times per day  . pravastatin  20 mg Oral Daily  . pregabalin  75 mg Oral Daily  . sevelamer carbonate  4.8 g Oral TID WC  . thiamine  100 mg Oral Daily  . vancomycin  750 mg Intravenous Q T,Th,Sa-HD   Continuous Infusions:  PRN Meds:.acetaminophen **OR** [DISCONTINUED] acetaminophen, albuterol, [DISCONTINUED] ondansetron **OR** ondansetron (ZOFRAN) IV, sevelamer carbonate  Antibiotics  :    Anti-infectives    Start  Dose/Rate Route Frequency Ordered Stop   12/29/15 1600  vancomycin (VANCOCIN) IVPB 750 mg/150 ml premix     750 mg 150 mL/hr over 60 Minutes Intravenous Every T-Th-Sa (Hemodialysis) 12/29/15 1154     12/29/15 1200  vancomycin (VANCOCIN) IVPB 750 mg/150 ml premix  Status:  Discontinued     750 mg 150 mL/hr over 60 Minutes Intravenous Every T-Th-Sa (Hemodialysis) 12/28/15 2345 12/29/15 1154   12/29/15 0000  vancomycin (VANCOCIN) 1,500 mg in sodium chloride 0.9 % 500 mL IVPB     1,500 mg 250 mL/hr over 120 Minutes Intravenous STAT 12/28/15 2345 12/29/15 0241   12/29/15 0000  piperacillin-tazobactam (ZOSYN) IVPB 2.25 g     2.25 g 100 mL/hr over 30  Minutes Intravenous 3 times per day 12/28/15 2345     12/28/15 1000  hydroxychloroquine (PLAQUENIL) tablet 200 mg     200 mg Oral 2 times daily 12/28/15 0508          Objective:   Filed Vitals:   12/30/15 0230 12/30/15 0300 12/30/15 0330 12/30/15 0400  BP: 84/60 82/60 72/55  95/66  Pulse: 81 85 83 83  Temp:    97.9 F (36.6 C)  TempSrc:    Oral  Resp: 13 19 13 14   Height:      Weight:      SpO2: 96% 97% 97% 98%    Wt Readings from Last 3 Encounters:  12/29/15 62.4 kg (137 lb 9.1 oz)  12/21/15 60.782 kg (134 lb)  12/12/15 60.782 kg (134 lb)     Intake/Output Summary (Last 24 hours) at 12/30/15 0906 Last data filed at 12/30/15 0542  Gross per 24 hour  Intake    150 ml  Output   2604 ml  Net  -2454 ml     Physical Exam  Awake Alert, Oriented X 3, No new F.N deficits, Normal affect [place].AT,PERRAL Supple Neck,No JVD, No cervical lymphadenopathy appriciated.  Symmetrical Chest wall movement, Good air movement bilaterally, CTAB RRR,No Gallops,Rubs or new Murmurs, No Parasternal Heave +ve B.Sounds, Abd Soft, mild RUQ tenderness, No organomegaly appriciated, No rebound - guarding or rigidity. No Cyanosis, Clubbing or edema, No new Rash or bruise      Data Review:   Micro Results Recent Results (from the past 240 hour(s))  MRSA PCR Screening     Status: Abnormal   Collection Time: 12/28/15  4:33 PM  Result Value Ref Range Status   MRSA by PCR POSITIVE (A) NEGATIVE Final    Comment:        The GeneXpert MRSA Assay (FDA approved for NASAL specimens only), is one component of a comprehensive MRSA colonization surveillance program. It is not intended to diagnose MRSA infection nor to guide or monitor treatment for MRSA infections. RESULT CALLED TO, READ BACK BY AND VERIFIED WITH: C.HANCOCK,RN AT 1913 BY L.PITT 12/28/15     Radiology Reports Ct Abdomen Pelvis Wo Contrast  12/28/2015  CLINICAL DATA:  Acute onset of upper abdominal pain, radiating to the back.  Nausea and vomiting. Diarrhea. Initial encounter. EXAM: CT ABDOMEN AND PELVIS WITHOUT CONTRAST TECHNIQUE: Multidetector CT imaging of the abdomen and pelvis was performed following the standard protocol without IV contrast. COMPARISON:  CT of the abdomen and pelvis performed 11/19/2014 FINDINGS: Mild left basilar scarring is noted. There is dilatation of the left hepatic duct and common bile duct, measuring up to 1.1 cm. This raises concern for distal obstruction. The liver and spleen are otherwise unremarkable in appearance. The gallbladder is within normal  limits. The pancreas and adrenal glands are unremarkable. A 2.0 cm left renal cyst is noted. Mild bilateral renal atrophy is noted. The kidneys are otherwise unremarkable. There is no evidence of hydronephrosis. No renal or ureteral stones are seen. No perinephric stranding is appreciated. No free fluid is identified. The small bowel is unremarkable in appearance. The stomach is within normal limits. No acute vascular abnormalities are seen. Scattered calcification is noted along the abdominal aorta and its branches. There is mild ectasia of the distal abdominal aorta. The appendix is normal in caliber, without evidence of appendicitis. The colon is unremarkable in appearance. The bladder is decompressed and not well assessed. The uterus is unremarkable in appearance. The ovaries are relatively symmetric. No suspicious adnexal masses are seen. No inguinal lymphadenopathy is seen. No acute osseous abnormalities are identified. IMPRESSION: 1. No acute abnormality seen within the abdomen or pelvis. 2. Dilatation of the left hepatic duct and common bile duct, measuring up to 1.1 cm. This raises concern for distal obstruction. Would correlate with LFTs. MRCP or ERCP could be considered for further evaluation. 3. Left renal cysts noted.  Mild bilateral renal atrophy seen. 4. Mild left basilar scarring noted. 5. Scattered calcification along the abdominal aorta and its  branches. Electronically Signed   By: Garald Balding M.D.   On: 12/28/2015 02:33   Mr Abdomen Mrcp Wo Cm  12/28/2015  CLINICAL DATA:  55 year old female with recent history of mid epigastric pain radiating to the back since 12/27/2015. No associated nausea or vomiting. Dilated left hepatic ducts noted on CT examination. Followup study. EXAM: MRI ABDOMEN WITHOUT CONTRAST  (INCLUDING MRCP) TECHNIQUE: Multiplanar multisequence MR imaging of the abdomen was performed. Heavily T2-weighted images of the biliary and pancreatic ducts were obtained, and three-dimensional MRCP images were rendered by post processing. COMPARISON:  Noncontrast CT the abdomen and pelvis 12/28/2015. FINDINGS: Lower chest:  Unremarkable. Hepatobiliary: Diffuse low signal intensity throughout the liver on T2 weighted sequences, indicative of iron deposition. No definite cystic or solid hepatic lesions are confidently identified on today's noncontrast examination. MRCP images demonstrate some very mild intrahepatic biliary ductal dilatation throughout the left lobe of the liver. The left hepatic duct itself is markedly dilated 12 mm in diameter. Common hepatic duct is mildly dilated measuring 9 mm in the hepatic hilum. Common bile duct is also mildly dilated measuring up to 7 mm in the porta hepatis. There are no filling defects within the common bile duct to suggest retained ductal stones. Additionally, there are no filling defects within the gallbladder to suggest gallstones. Gallbladder is normal in appearance. Pancreas: No definite pancreatic mass identified on today's noncontrast examination. No pancreatic ductal dilatation. No pancreatic or peripancreatic fluid or inflammatory changes. Spleen: Diffuse low signal intensity throughout the spleen on T2 weighted images indicative of iron deposition. Adrenals/Urinary Tract: Bilateral adrenal glands are normal in appearance. 2 cm well-circumscribed T1 hypointense, T2 hyperintense lesion in the  interpolar region of the left kidney is incompletely characterized on today's noncontrast CT examination, but is statistically likely a cyst. Right kidney is normal in appearance. No hydroureteronephrosis in the visualized abdomen. Stomach/Bowel: Visualized portions are unremarkable. Vascular/Lymphatic: No definite aneurysm identified in the abdominal vasculature. No lymphadenopathy noted in the abdomen. Other: No significant volume of ascites in the visualized peritoneal cavity. Musculoskeletal: No aggressive osseous lesion noted in the visualized portions of the skeleton. IMPRESSION: 1. Dilatation of the left hepatic duct common common hepatic duct, and to a lesser extent the common bile duct. There  is minimal associated left intrahepatic biliary ductal dilatation. These findings are unusual and are of uncertain etiology and significance, but could suggest the presence of a choledochal cyst. 2. Iron deposition in the liver and spleen. Electronically Signed   By: Vinnie Langton M.D.   On: 12/28/2015 13:20   Mr 3d Recon At Scanner  12/28/2015  CLINICAL DATA:  54 year old female with recent history of mid epigastric pain radiating to the back since 12/27/2015. No associated nausea or vomiting. Dilated left hepatic ducts noted on CT examination. Followup study. EXAM: MRI ABDOMEN WITHOUT CONTRAST  (INCLUDING MRCP) TECHNIQUE: Multiplanar multisequence MR imaging of the abdomen was performed. Heavily T2-weighted images of the biliary and pancreatic ducts were obtained, and three-dimensional MRCP images were rendered by post processing. COMPARISON:  Noncontrast CT the abdomen and pelvis 12/28/2015. FINDINGS: Lower chest:  Unremarkable. Hepatobiliary: Diffuse low signal intensity throughout the liver on T2 weighted sequences, indicative of iron deposition. No definite cystic or solid hepatic lesions are confidently identified on today's noncontrast examination. MRCP images demonstrate some very mild intrahepatic  biliary ductal dilatation throughout the left lobe of the liver. The left hepatic duct itself is markedly dilated 12 mm in diameter. Common hepatic duct is mildly dilated measuring 9 mm in the hepatic hilum. Common bile duct is also mildly dilated measuring up to 7 mm in the porta hepatis. There are no filling defects within the common bile duct to suggest retained ductal stones. Additionally, there are no filling defects within the gallbladder to suggest gallstones. Gallbladder is normal in appearance. Pancreas: No definite pancreatic mass identified on today's noncontrast examination. No pancreatic ductal dilatation. No pancreatic or peripancreatic fluid or inflammatory changes. Spleen: Diffuse low signal intensity throughout the spleen on T2 weighted images indicative of iron deposition. Adrenals/Urinary Tract: Bilateral adrenal glands are normal in appearance. 2 cm well-circumscribed T1 hypointense, T2 hyperintense lesion in the interpolar region of the left kidney is incompletely characterized on today's noncontrast CT examination, but is statistically likely a cyst. Right kidney is normal in appearance. No hydroureteronephrosis in the visualized abdomen. Stomach/Bowel: Visualized portions are unremarkable. Vascular/Lymphatic: No definite aneurysm identified in the abdominal vasculature. No lymphadenopathy noted in the abdomen. Other: No significant volume of ascites in the visualized peritoneal cavity. Musculoskeletal: No aggressive osseous lesion noted in the visualized portions of the skeleton. IMPRESSION: 1. Dilatation of the left hepatic duct common common hepatic duct, and to a lesser extent the common bile duct. There is minimal associated left intrahepatic biliary ductal dilatation. These findings are unusual and are of uncertain etiology and significance, but could suggest the presence of a choledochal cyst. 2. Iron deposition in the liver and spleen. Electronically Signed   By: Vinnie Langton M.D.    On: 12/28/2015 13:20   Dg Chest Port 1 View  12/29/2015  CLINICAL DATA:  Shortness of breath for 2 days. EXAM: PORTABLE CHEST 1 VIEW COMPARISON:  12/21/2015 and prior chest radiographs FINDINGS: A left IJ central venous catheter is noted with tip overlying the lower SVC. The cardiomediastinal silhouette is unremarkable. Mild bibasilar atelectasis now identified. There is no evidence of pulmonary edema, pneumothorax or large pleural effusion. IMPRESSION: Mild bibasilar atelectasis without other significant change. Electronically Signed   By: Margarette Canada M.D.   On: 12/29/2015 08:12   Dg Chest Port 1v Same Day  12/21/2015  CLINICAL DATA:  Dialysis catheter placement. EXAM: PORTABLE CHEST 1 VIEW COMPARISON:  October 07, 2015. FINDINGS: The heart size and mediastinal contours are within normal limits.  Left lung is clear. Minimal right basilar subsegmental atelectasis or scarring is noted. No pneumothorax or pleural effusion is noted. Right internal jugular dialysis catheter noted on prior exam has been replaced with left internal jugular dialysis catheter, with distal tip in expected position of the SVC. The visualized skeletal structures are unremarkable. IMPRESSION: Placement of new left internal jugular dialysis catheter with distal tip in expected position of the SVC. No pneumothorax is noted. Electronically Signed   By: Marijo Conception, M.D.   On: 12/21/2015 13:07     CBC  Recent Labs Lab 12/27/15 2239 12/29/15 0644 12/30/15 0005 12/30/15 0419  WBC 8.7 7.6 8.3 9.2  HGB 14.3 11.4* 10.2* 9.9*  HCT 44.8 35.2* 32.5* 31.0*  PLT 195 129* 141* 125*  MCV 85.7 85.9 84.9 84.2  MCH 27.3 27.8 26.6 26.9  MCHC 31.9 32.4 31.4 31.9  RDW 17.5* 17.5* 17.5* 17.6*    Chemistries   Recent Labs Lab 12/27/15 2239 12/29/15 0644 12/30/15 0005 12/30/15 0419  NA 136 128* 136 138  K 5.1 6.6* 4.4 4.7  CL 93* 92* 97* 100*  CO2 21* 18* 22 20*  GLUCOSE 164* 54* 94 72  BUN 29* 44* 23* 24*  CREATININE 6.71*  9.35* 6.49* 6.85*  CALCIUM 10.3 8.6* 8.4* 8.6*  AST 25 24  --  25  ALT 15 11*  --  12*  ALKPHOS 104 97  --  82  BILITOT 1.1 1.3*  --  1.0   ------------------------------------------------------------------------------------------------------------------ No results for input(s): CHOL, HDL, LDLCALC, TRIG, CHOLHDL, LDLDIRECT in the last 72 hours.  Lab Results  Component Value Date   HGBA1C 4.7 06/29/2015   ------------------------------------------------------------------------------------------------------------------ No results for input(s): TSH, T4TOTAL, T3FREE, THYROIDAB in the last 72 hours.  Invalid input(s): FREET3 ------------------------------------------------------------------------------------------------------------------  Recent Labs  12/29/15 1012  VITAMINB12 932*  FOLATE 46.6  FERRITIN 670*  TIBC 182*  IRON 14*  RETICCTPCT 1.7    Coagulation profile No results for input(s): INR, PROTIME in the last 168 hours.  No results for input(s): DDIMER in the last 72 hours.  Cardiac Enzymes No results for input(s): CKMB, TROPONINI, MYOGLOBIN in the last 168 hours.  Invalid input(s): CK ------------------------------------------------------------------------------------------------------------------ No results found for: BNP  Time Spent in minutes  35   SINGH,PRASHANT K M.D on 12/30/2015 at 9:06 AM  Between 7am to 7pm - Pager - [phone]  After 7pm go to [email or web address] - password [facility]  [facility] -  Office  [phone]

## 2015-12-30 NOTE — Progress Notes (Signed)
[facility] Progress Note   Subjective: feeling "a lot better"  Filed Vitals:   12/30/15 0330 12/30/15 0400 12/30/15 1100 12/30/15 1537  BP: 72/55 95/66 107/66 108/74  Pulse: 83 83 99 80  Temp:  97.9 F (36.6 C)  97.4 F (36.3 C)  TempSrc:  Oral  Oral  Resp: 13 14 24 22   Height:      Weight:      SpO2: 97% 98% 99% 10%    Inpatient medications: . calcitRIOL  1.25 mcg Oral Q T,Th,Sa-HD  . Chlorhexidine Gluconate Cloth  6 each Topical Q0600  . cinacalcet  30 mg Oral Q breakfast  . heparin  5,000 Units Subcutaneous 3 times per day  . hydroxychloroquine  200 mg Oral BID  . midodrine  5 mg Oral TID WC  . multivitamin  1 tablet Oral QHS  . mupirocin ointment  1 application Nasal BID  . pantoprazole  40 mg Oral Daily  . piperacillin-tazobactam (ZOSYN)  IV  2.25 g Intravenous 3 times per day  . pravastatin  20 mg Oral Daily  . pregabalin  75 mg Oral Daily  . sevelamer carbonate  4.8 g Oral TID WC  . thiamine  100 mg Oral Daily  . vancomycin  750 mg Intravenous Q T,Th,Sa-HD     acetaminophen **OR** [DISCONTINUED] acetaminophen, albuterol, [DISCONTINUED] ondansetron **OR** ondansetron (ZOFRAN) IV, sevelamer carbonate  Exam: General: slender AAF in no acute distress. Head: Normocephalic, atraumatic, sclera non-icteric, mucus membranes are moist Neck: Supple. JVD not elevated. Lungs: Clear bilaterally to auscultation without wheezes, rales, or rhonchi. Breathing is unlabored. Heart: Tachy reg low 100s Abdomen: Soft, non-tender, non-distended with normoactive bowel sounds.  Lower extremities:without edema or ischemic changes, no open wounds  Neuro: Alert and oriented X 3. Moves all extremities spontaneously. Psych: Responds to questions appropriately with a normal affect. Dialysis Access:right upper AVF 1st stage BKV 2/17 (right hand slightly cooler than left and left IJ  Dialysis Orders: [facility] 4.25 hours 300/800 right IJ heparin 5400 2 K 2 Ca venofer 50/wk on  thursdays, Mircera 50 q 2 weeks last 2/23 calcitriol 1.25. EDW just rasied to 61 from 59.5 on 3/23 - post HD wt was 61.4 on 2/23 Recent labs: hgb 13.5 up from 11.5 - 27% sat Ca/ P both 8 iPTH 857  Assessment/Plan: 1. Abdominal pain- improving. For HIDA per primary.  2. Fever spike to 101, blood cx's negative, on Vanc and Zosyn 3. Hyperkalemia - resolved 4. ESRD - TTS - time shortened today to 3 hr due to high patient volume makes it harder to get volume off and K down - repeat labs in am; Not clear why her BFR is at 300 - we are running at 400 today and this needs to be increased at discharge. Appt to deal with Issues with access partial steal symptoms can be rescheduled if d/c soon or [facility] can be consulted Monday if still here 5. Hypotension/volume - up 1kg by wts. Had big BP drop last night after 2.5 L UF on HD.   6. Anemia - Hgb 11.4 - consistent with outpt Hgb - last outpt tsat was 27% now 8% with ferritin 670 - needs repletion- has been on 50 per week - probably can hold Mircera and just repelte Fe for now 7. Metabolic bone disease - Continue calcitriol, binders, sensipar  8. Nutrition - renal carb mod diet/vit 9. SLE - on plaquenil  Plan - as above, next HD Tuesday   [facility] pager [phone]    cell [phone] 12/30/2015, 3:53 PM    Recent Labs Lab 12/29/15 0644 12/30/15 0005 12/30/15 0419  NA 128* 136 138  K 6.6* 4.4 4.7  CL 92* 97* 100*  CO2 18* 22 20*  GLUCOSE 54* 94 72  BUN 44* 23* 24*  CREATININE 9.35* 6.49* 6.85*  CALCIUM 8.6* 8.4* 8.6*    Recent Labs Lab 12/27/15 2239 12/29/15 0644 12/30/15 0419  AST 25 24 25   ALT 15 11* 12*  ALKPHOS 104 97 82  BILITOT 1.1 1.3* 1.0  PROT 10.3* 7.0 6.6  ALBUMIN 3.7 2.7* 2.2*    Recent Labs Lab 12/29/15 0644 12/30/15 0005 12/30/15 0419  WBC 7.6 8.3 9.2  HGB 11.4* 10.2* 9.9*  HCT 35.2* 32.5* 31.0*  MCV 85.9 84.9 84.2  PLT 129* 141* 125*

## 2015-12-30 NOTE — Progress Notes (Signed)
Hypoglycemic Event  CBG: 69  Treatment: 15 GM carbohydrate snack  Symptoms: None  Follow-up CBG: Time:0550 CBG Result:109  Possible Reasons for Event: Inadequate meal intake  Comments/MD notified:    Reap, Jon Gills

## 2015-12-30 NOTE — Progress Notes (Signed)
Report called to receiving nurse, Jacqlyn Larsen, on 2C, son at bedside and notified. Jacqualyn Posey, RN

## 2015-12-30 NOTE — Procedures (Signed)
  I was present at this dialysis session, have reviewed the session itself and made  appropriate changes Kelly Splinter MD [place] pager [phone]    cell [phone] 12/29/2015, 3:10 PM

## 2015-12-30 NOTE — Progress Notes (Signed)
Return call from Dr. Jonnie Finner verbal orders given for an additional NS 500 cc bolus and to draw a CBC and BMET, instructed to contact hospitalist to notify and for possible transfer. Jacqualyn Posey, RN

## 2015-12-30 NOTE — Progress Notes (Signed)
200 cc of NS bolus completed thus far, current VSS - BP 69/49, P 85, T 98.9 F, patient remains A&Ox4. Hospitalist text paged to notify, Will continue to monitor Jacqualyn Posey, RN

## 2015-12-31 LAB — COMPREHENSIVE METABOLIC PANEL
ALBUMIN: 2.1 g/dL — AB (ref 3.5–5.0)
ALK PHOS: 84 U/L (ref 38–126)
ALT: 11 U/L — ABNORMAL LOW (ref 14–54)
ANION GAP: 17 — AB (ref 5–15)
AST: 21 U/L (ref 15–41)
BILIRUBIN TOTAL: 0.9 mg/dL (ref 0.3–1.2)
BUN: 36 mg/dL — ABNORMAL HIGH (ref 6–20)
CALCIUM: 7.9 mg/dL — AB (ref 8.9–10.3)
CO2: 25 mmol/L (ref 22–32)
Chloride: 92 mmol/L — ABNORMAL LOW (ref 101–111)
Creatinine, Ser: 8.86 mg/dL — ABNORMAL HIGH (ref 0.44–1.00)
GFR calc non Af Amer: 5 mL/min — ABNORMAL LOW (ref >60)
GFR, EST AFRICAN AMERICAN: 5 mL/min — AB (ref >60)
GLUCOSE: 81 mg/dL (ref 65–99)
POTASSIUM: 4.2 mmol/L (ref 3.5–5.1)
SODIUM: 134 mmol/L — AB (ref 135–145)
TOTAL PROTEIN: 6.5 g/dL (ref 6.5–8.1)

## 2015-12-31 LAB — CBC
HEMATOCRIT: 31.1 % — AB (ref 36.0–46.0)
Hemoglobin: 10.1 g/dL — ABNORMAL LOW (ref 12.0–15.0)
MCH: 27.4 pg (ref 26.0–34.0)
MCHC: 32.5 g/dL (ref 30.0–36.0)
MCV: 84.5 fL (ref 78.0–100.0)
Platelets: 145 10*3/uL — ABNORMAL LOW (ref 150–400)
RBC: 3.68 MIL/uL — ABNORMAL LOW (ref 3.87–5.11)
RDW: 17.7 % — AB (ref 11.5–15.5)
WBC: 9.5 10*3/uL (ref 4.0–10.5)

## 2015-12-31 LAB — GLUCOSE, CAPILLARY
GLUCOSE-CAPILLARY: 94 mg/dL (ref 65–99)
Glucose-Capillary: 122 mg/dL — ABNORMAL HIGH (ref 65–99)
Glucose-Capillary: 113 mg/dL — ABNORMAL HIGH (ref 65–99)

## 2015-12-31 LAB — MAGNESIUM: Magnesium: 1.8 mg/dL (ref 1.7–2.4)

## 2015-12-31 MED ORDER — DOCUSATE SODIUM 100 MG PO CAPS
200.0000 mg | ORAL_CAPSULE | Freq: Two times a day (BID) | ORAL | Status: DC
  Administered 2015-12-31 – 2016-01-01 (×3): 200 mg via ORAL
  Filled 2015-12-31 (×3): qty 2

## 2015-12-31 MED ORDER — HEPARIN SODIUM (PORCINE) 1000 UNIT/ML DIALYSIS: 100.0000 [IU]/kg | INTRAMUSCULAR | Status: DC | PRN

## 2015-12-31 MED ORDER — BISACODYL 10 MG RE SUPP
10.0000 mg | Freq: Every day | RECTAL | Status: DC
Start: 2015-12-31 — End: 2016-01-01
  Filled 2015-12-31: qty 1

## 2015-12-31 MED ORDER — POLYETHYLENE GLYCOL 3350 17 G PO PACK
17.0000 g | PACK | Freq: Two times a day (BID) | ORAL | Status: DC
  Administered 2015-12-31 (×2): 17 g via ORAL
  Filled 2015-12-31 (×3): qty 1

## 2015-12-31 NOTE — Progress Notes (Signed)
   12/31/15 0900  Clinical Encounter Type  Visited With Patient  Visit Type Spiritual support  Referral From Nurse  Spiritual Encounters  Spiritual Needs Literature  Patient needs Adv Dir. Took copy and walked her through it. She wants to wait until her sister comes in the afternoon to complete it. Told her to call us when she is ready.

## 2015-12-31 NOTE — Progress Notes (Signed)
[name] [name] Progress Note   Subjective: cont ot improve, no fever  Filed Vitals:   12/31/15 0200 12/31/15 0400 12/31/15 0500 12/31/15 0600  BP: 113/69 118/67 114/69 103/65  Pulse: 71 78 70 77  Temp:  98.2 F (36.8 C)    TempSrc:  Oral    Resp: 12 12 10 22   Height:      Weight:      SpO2: 96% 98% 97% 94%    Inpatient medications: . calcitRIOL  1.25 mcg Oral Q T,Th,Sa-HD  . Chlorhexidine Gluconate Cloth  6 each Topical Q0600  . cinacalcet  30 mg Oral Q breakfast  . heparin  5,000 Units Subcutaneous 3 times per day  . hydroxychloroquine  200 mg Oral BID  . midodrine  5 mg Oral TID WC  . multivitamin  1 tablet Oral QHS  . mupirocin ointment  1 application Nasal BID  . pantoprazole  40 mg Oral Daily  . piperacillin-tazobactam (ZOSYN)  IV  2.25 g Intravenous 3 times per day  . pravastatin  20 mg Oral Daily  . pregabalin  75 mg Oral Daily  . sevelamer carbonate  4.8 g Oral TID WC  . thiamine  100 mg Oral Daily  . vancomycin  750 mg Intravenous Q T,Th,Sa-HD     acetaminophen **OR** [DISCONTINUED] acetaminophen, albuterol, [DISCONTINUED] ondansetron **OR** ondansetron (ZOFRAN) IV, sevelamer carbonate  Exam: General: slender AAF in no acute distress. Head: Normocephalic, atraumatic, sclera non-icteric, mucus membranes are moist Neck: Supple. JVD not elevated. Lungs: Clear bilaterally to auscultation without wheezes, rales, or rhonchi. Breathing is unlabored. Heart: Tachy reg low 100s Abdomen: Soft, non-tender, non-distended with normoactive bowel sounds.  Lower extremities:without edema or ischemic changes, no open wounds  Neuro: Alert and oriented X 3. Moves all extremities spontaneously. Psych: Responds to questions appropriately with a normal affect. Dialysis Access:right upper AVF 1st stage BKV 2/17 (right hand slightly cooler than left and left IJ  Dialysis Orders: [facility] 4.25 hours 300/800 right IJ heparin 5400 2 K 2 Ca venofer 50/wk on thursdays,  Mircera 50 q 2 weeks last 2/23 calcitriol 1.25. EDW just rasied to 61 from 59.5 on 3/23 - post HD wt was 61.4 on 2/23 Recent labs: hgb 13.5 up from 11.5 - 27% sat Ca/ P both 8 iPTH 857  Assessment/Plan: 1. Abdominal pain- improving. Saw GI, per PMD 2. Fever spike to 101 2/25, blood cx's negative, on Vanc and Zosyn 3. Hyperkalemia - resolved 4. ESRD - TTS - time shortened today to 3 hr due to high patient volume makes it harder to get volume off and K down - repeat labs in am; Not clear why her BFR is at 300 - we are running at 400 today and this needs to be increased at discharge. Appt to deal with Issues with access partial steal symptoms can be rescheduled if d/c soon or [facility] can be consulted Monday if still here - favor upon DC since recent infection concern. 5. Hypotension/volume - BP stable 6. Anemia - Hgb 11.4 - consistent with outpt Hgb - last outpt tsat was 27% now 8% with ferritin 670 - needs repletion- has been on 50 per week - probably can hold Mircera and just repelte Fe for now 7. Metabolic bone disease - Continue calcitriol, binders, sensipar  8. Nutrition - renal carb mod diet/vit 9. SLE - on plaquenil  Plan - HD tonmorrow   Pearson Grippe MD  12/31/2015, 10:39 AM    Recent Labs Lab 12/30/15 0005 12/30/15 [id number]  12/31/15 0315  NA 136 138 134*  K 4.4 4.7 4.2  CL 97* 100* 92*  CO2 22 20* 25  GLUCOSE 94 72 81  BUN 23* 24* 36*  CREATININE 6.49* 6.85* 8.86*  CALCIUM 8.4* 8.6* 7.9*    Recent Labs Lab 12/29/15 0644 12/30/15 0419 12/31/15 0315  AST 24 25 21   ALT 11* 12* 11*  ALKPHOS 97 82 84  BILITOT 1.3* 1.0 0.9  PROT 7.0 6.6 6.5  ALBUMIN 2.7* 2.2* 2.1*    Recent Labs Lab 12/30/15 0005 12/30/15 0419 12/31/15 0315  WBC 8.3 9.2 9.5  HGB 10.2* 9.9* 10.1*  HCT 32.5* 31.0* 31.1*  MCV 84.9 84.2 84.5  PLT 141* 125* 145*

## 2015-12-31 NOTE — Progress Notes (Signed)
Pharmacy Antibiotic Note  ARPI FILARDI is a 55 y.o. female admitted on 12/27/2015 with abdominal pain  Pharmacy has been consulted for managing vancomycin and Zosyn for sepsis.  Patient has ESRD on MD on TTS, next session tomorrow.  Plan: - Continue vanc 750mg  IV q-HD TTS - Continue Zosyn 2.25gm IV Q8H - Monitor HD tolerance, clinical progress, vanc level as indicated - F/U start ASA for hx CVA   Height: 5\' 5"  (165.1 cm) Weight: 137 lb 9.1 oz (62.4 kg) IBW/kg (Calculated) : 57  Temp (24hrs), Avg:98.1 F (36.7 C), Min:97.4 F (36.3 C), Max:99 F (37.2 C)   Recent Labs Lab 12/27/15 2239 12/29/15 0644 12/30/15 0005 12/30/15 0419 12/30/15 0816 12/30/15 1150 12/31/15 0315  WBC 8.7 7.6 8.3 9.2  --   --  9.5  CREATININE 6.71* 9.35* 6.49* 6.85*  --   --  8.86*  LATICACIDVEN  --   --   --   --  0.8 0.9  --     Estimated Creatinine Clearance: 6.6 mL/min (by C-G formula based on Cr of 8.86).    Allergies  Allergen Reactions  . Sulfa Antibiotics Itching    Antimicrobials this admission: 2/25 Vanc >>  2/25 Zosyn >>  Dose adjustments this admission: None  Microbiology results: 2/24 BCx x2 - NGTD 2/24 MRSA PCR: positive 2/25 RVP -  2/27 Flu - negative   Dayna Alia D. Mina Marble, PharmD, BCPS Pager:  [phone] 12/31/2015, 12:54 PM

## 2015-12-31 NOTE — Care Management Note (Addendum)
Case Management Note  Patient Details  Name: [name] [name] MRN: [id number] Date of Birth: Nov 22, 1960  Subjective/Objective:      Abdominal pain, fever, ESRD, Dialysis              Action/Plan: NCM spoke to pt and states her sons live in the home. She can afford her medications at home. She does not need any DME to ambulate. Pt drives to her dialysis treatments.   Expected Discharge Date:  01/01/2016               Expected Discharge Plan:  Home/Self Care  In-House Referral:  NA  Discharge planning Services  CM Consult  Post Acute Care Choice:  NA Choice offered to:  NA  DME Arranged:  N/A DME Agency:  NA  HH Arranged:  NA HH Agency:  NA  Status of Service:  Completed, signed off  Medicare Important Message Given:    Date Medicare IM Given:    Medicare IM give by:    Date Additional Medicare IM Given:    Additional Medicare Important Message give by:     If discussed at [place] of Stay Meetings, dates discussed:    Additional Comments:  Erenest Rasher, RN 12/31/2015, 11:56 AM

## 2015-12-31 NOTE — Evaluation (Signed)
Physical Therapy Evaluation Patient Details Name: [name] [name] MRN: [id number] DOB: 09-27-1961 Today's Date: 12/31/2015   History of Present Illness  [name] [name] is a 55 year old with a past medical history of diabetes mellitus type 2, ESRD on HD T/Th/Sat, lupus, HTN; who presents with complaints of epigastric pain. Pain was sharp and located in the epigastric region. Pain radiated around to her back. Nothing really helped to make symptoms better except for pain medications. Hemodialysis seemed to worsen pain symptoms.Upon admission to the emergency department patient was evaluated with a CT scan of the abdomen which showed dilatation of the left hepatic and common bile duct measuring up to 1.1 cm.    Clinical Impression  Pt admitted with above diagnosis. Pt currently with functional limitations due to the deficits listed below (see PT Problem List). Pt able to ambulate in hall without AD but very unsteady on her feet and actively seeks out UE support. Pt required one hand hold assist intermittently for balance. Pt states that she has a cane that she uses for longer distances but may need to use it all the time. Will continue to evaluate balance for need for cane vs RW. Pt will benefit from skilled PT to increase their independence and safety with mobility to allow discharge back to home with family 24 hour support.      Follow Up Recommendations Home health PT;Supervision/Assistance - 24 hour    Equipment Recommendations  Other (comment) (will continue to assess )    Recommendations for Other Services       Precautions / Restrictions Precautions Precautions: Fall Restrictions Weight Bearing Restrictions: No      Mobility  Bed Mobility Overal bed mobility: Needs Assistance Bed Mobility: Supine to Sit     Supine to sit: Min assist     General bed mobility comments: Needed MinA to bring trunk upright due to stomach pain.   Transfers Overall transfer level: Needs  assistance Equipment used: None Transfers: Sit to/from Stand Sit to Stand: Min guard         General transfer comment: Did not require physical assistance but seeks one UE support for stability.   Ambulation/Gait Ambulation/Gait assistance: Min assist Ambulation Distance (Feet): 60 Feet Assistive device: 1 person hand held assist Gait Pattern/deviations: Step-through pattern;Decreased stride length;Staggering left;Staggering right;Narrow base of support Gait velocity: decreased Gait velocity interpretation: Below normal speed for age/gender General Gait Details: Pt able to ambulate without AD, but is very unsteady and seeks out UE support for balance. Will continue to evalute for cane vs. RW.   Stairs            Wheelchair Mobility    Modified Rankin (Stroke Patients Only)       Balance Overall balance assessment: Needs assistance Sitting-balance support: No upper extremity supported;Feet supported Sitting balance-Leahy Scale: Good     Standing balance support: No upper extremity supported Standing balance-Leahy Scale: Fair Standing balance comment: Pt actively seeks out UE support in standing for stability.              High level balance activites: Turns High Level Balance Comments: Pt required MinA for balance for turns.              Pertinent Vitals/Pain Pain Assessment: 0-10 Pain Score: 7  Pain Location: stomach area Pain Descriptors / Indicators: Constant;Aching Pain Intervention(s): Limited activity within patient's tolerance;Monitored during session;Repositioned  Vitals stable throughout activity on room air.     Home Living Family/patient expects to be discharged  to:: Private residence Living Arrangements: Children Available Help at Discharge: Family;Available 24 hours/day Type of Home: Apartment Home Access: Stairs to enter Entrance Stairs-Rails: Right Entrance Stairs-Number of Steps: 3 Home Layout: One level Home Equipment: Cane -  single point      Prior Function Level of Independence: Independent with assistive device(s)         Comments: Independent with cane for longer distances.      Hand Dominance   Dominant Hand: Right    Extremity/Trunk Assessment   Upper Extremity Assessment: Overall WFL for tasks assessed           Lower Extremity Assessment: Generalized weakness      Cervical / Trunk Assessment: Normal  Communication   Communication: No difficulties  Cognition Arousal/Alertness: Awake/alert Behavior During Therapy: WFL for tasks assessed/performed Overall Cognitive Status: Within Functional Limits for tasks assessed                      General Comments General comments (skin integrity, edema, etc.): Pt agreeable to therapy but did not want to do much due to stomach pain. IV RN to place new line at end of treatment and asked that pt return to bed.     Exercises        Assessment/Plan    PT Assessment Patient needs continued PT services  PT Diagnosis Difficulty walking;Abnormality of gait;Generalized weakness;Acute pain   PT Problem List Decreased strength;Decreased activity tolerance;Decreased balance;Decreased mobility;Pain  PT Treatment Interventions DME instruction;Gait training;Stair training;Functional mobility training;Therapeutic activities;Therapeutic exercise;Balance training;Patient/family education   PT Goals (Current goals can be found in the Care Plan section) Acute Rehab PT Goals Patient Stated Goal: To feel better and go home PT Goal Formulation: With patient Time For Goal Achievement: 01/14/16 Potential to Achieve Goals: Good    Frequency Min 3X/week   Barriers to discharge        Co-evaluation               End of Session Equipment Utilized During Treatment: Gait belt Activity Tolerance: Patient tolerated treatment well;Patient limited by pain Patient left: in bed;with call bell/phone within reach;with nursing/sitter in room Nurse  Communication: Mobility status         Time: [id number] PT Time Calculation (min) (ACUTE ONLY): 12 min   Charges:   PT Evaluation $PT Eval Moderate Complexity: 1 Procedure     PT G Codes:       Colon Branch, SPT Colon Branch 12/31/2015, 4:23 PM

## 2015-12-31 NOTE — Progress Notes (Signed)
Report given to Wells Guiles, RN for 6E10. Patient will be transferred via bed with belongings. Belongings include clothing. Patient will be on telemetry.  CCMD notified of transfer.  Milford Cage, RN

## 2015-12-31 NOTE — Progress Notes (Signed)
Patient Demographics:    [name] [name], is a 54 y.o. female, DOB - Mar 30, 1961, [id number]  Admit date - 12/27/2015   Admitting Physician Norval Morton, MD  Outpatient Primary MD for the patient is Lamar Blinks, MD  LOS - 3   Chief Complaint  Patient presents with  . Abdominal Pain  . Diarrhea        Subjective:    [name] [name] today has, No headache, No chest pain, No abdominal pain - No Nausea, No new weakness tingling or numbness, No Cough - SOB.    Assessment  & Plan :     1. Upper abdominal pain. No febrile. CT and MRI confirmed CBD, left hepatic and common hepatic duct dilation. HIDA scan was unremarkable, Liver enzymes along with total bilirubin stable, no abdominal pain, now afebrile. Due to fevers on 12/29/2015 for now continue on empiric antibiotics until cultures finalize, GI was on board.  2. Fever early morning 12/29/2015. Blood cultures obtained and pending, Chest x-ray stable, ESRD patient who makes minimal to no urine, influenza panel negative, currently on empiric antibiotics, will monitor clinically, she appears nontoxic. Denies any body aches, no cough, pain or discomfort.   3. ESRD. On Tuesday-Thursday-Saturday dialysis schedule. Renal informed.   4. History of lupus. Currently on plaquenil, continue, outpatient follow-up with rheumatology and ophthalmology as needed.   5. Iron deposits on liver and spleen noted on CT scan. Ferritin 670, check Transferrin was low. Stop Iron supplementation. Repeat iron Panel in 4 weeks.   6. Prolonged QTC upon admission. Resolved.   7.GERD - on PPI   8. Chronic pain and neuropathy. On Lyrica continue along with as needed oxycodone.   7. Dyslipidemia. On statin.    Code Status : Full  Family Communication  : Family  member bedside  Disposition Plan  : Stay inpatient  Consults  :  GI, renal, D/W [facility] Dr Molli Posey 12-30-15  Procedures  :   MRCP - CT Abd - dilation of left hepatic duct and common hepatic duct and CBD. And a positive and an liver and spleen. Left renal cyst.  HIDA - unremarkable  DVT Prophylaxis  : Heparin    Lab Results  Component Value Date   PLT 145* 12/31/2015    Inpatient Medications  Scheduled Meds: . calcitRIOL  1.25 mcg Oral Q T,Th,Sa-HD  . Chlorhexidine Gluconate Cloth  6 each Topical Q0600  . cinacalcet  30 mg Oral Q breakfast  . heparin  5,000 Units Subcutaneous 3 times per day  . hydroxychloroquine  200 mg Oral BID  . midodrine  5 mg Oral TID WC  . multivitamin  1 tablet Oral QHS  . mupirocin ointment  1 application Nasal BID  . pantoprazole  40 mg Oral Daily  . piperacillin-tazobactam (ZOSYN)  IV  2.25 g Intravenous 3 times per day  . pravastatin  20 mg Oral Daily  . pregabalin  75 mg Oral Daily  . sevelamer carbonate  4.8 g Oral TID WC  . thiamine  100 mg Oral Daily  . vancomycin  750 mg Intravenous Q T,Th,Sa-HD   Continuous Infusions:  PRN Meds:.acetaminophen **OR** [DISCONTINUED] acetaminophen, albuterol, [DISCONTINUED] ondansetron **OR** ondansetron (ZOFRAN) IV, sevelamer carbonate  Antibiotics  :    Anti-infectives  Start     Dose/Rate Route Frequency Ordered Stop   12/29/15 1600  vancomycin (VANCOCIN) IVPB 750 mg/150 ml premix     750 mg 150 mL/hr over 60 Minutes Intravenous Every T-Th-Sa (Hemodialysis) 12/29/15 1154     12/29/15 1200  vancomycin (VANCOCIN) IVPB 750 mg/150 ml premix  Status:  Discontinued     750 mg 150 mL/hr over 60 Minutes Intravenous Every T-Th-Sa (Hemodialysis) 12/28/15 2345 12/29/15 1154   12/29/15 0000  vancomycin (VANCOCIN) 1,500 mg in sodium chloride 0.9 % 500 mL IVPB     1,500 mg 250 mL/hr over 120 Minutes Intravenous STAT 12/28/15 2345 12/29/15 0241   12/29/15 0000  piperacillin-tazobactam (ZOSYN) IVPB 2.25 g     2.25  g 100 mL/hr over 30 Minutes Intravenous 3 times per day 12/28/15 2345     12/28/15 1000  hydroxychloroquine (PLAQUENIL) tablet 200 mg     200 mg Oral 2 times daily 12/28/15 0508          Objective:   Filed Vitals:   12/31/15 0200 12/31/15 0400 12/31/15 0500 12/31/15 0600  BP: 113/69 118/67 114/69 103/65  Pulse: 71 78 70 77  Temp:  98.2 F (36.8 C)    TempSrc:  Oral    Resp: 12 12 10 22   Height:      Weight:      SpO2: 96% 98% 97% 94%    Wt Readings from Last 3 Encounters:  12/29/15 62.4 kg (137 lb 9.1 oz)  12/21/15 60.782 kg (134 lb)  12/12/15 60.782 kg (134 lb)     Intake/Output Summary (Last 24 hours) at 12/31/15 [id number] Last data filed at 12/31/15 0518  Gross per 24 hour  Intake    540 ml  Output      0 ml  Net    540 ml     Physical Exam  Awake Alert, Oriented X 3, No new F.N deficits, Normal affect [place].AT,PERRAL Supple Neck,No JVD, No cervical lymphadenopathy appriciated.  Symmetrical Chest wall movement, Good air movement bilaterally, CTAB RRR,No Gallops,Rubs or new Murmurs, No Parasternal Heave +ve B.Sounds, Abd Soft, mild RUQ tenderness, No organomegaly appriciated, No rebound - guarding or rigidity. No Cyanosis, Clubbing or edema, No new Rash or bruise      Data Review:   Micro Results Recent Results (from the past 240 hour(s))  MRSA PCR Screening     Status: Abnormal   Collection Time: 12/28/15  4:33 PM  Result Value Ref Range Status   MRSA by PCR POSITIVE (A) NEGATIVE Final    Comment:        The GeneXpert MRSA Assay (FDA approved for NASAL specimens only), is one component of a comprehensive MRSA colonization surveillance program. It is not intended to diagnose MRSA infection nor to guide or monitor treatment for MRSA infections. RESULT CALLED TO, READ BACK BY AND VERIFIED WITH: C.HANCOCK,RN AT 1913 BY L.PITT 12/28/15   Culture, blood (routine x 2)     Status: None (Preliminary result)   Collection Time: 12/28/15 11:10 PM  Result Value  Ref Range Status   Specimen Description BLOOD LEFT ARM  Final   Special Requests BOTTLES DRAWN AEROBIC AND ANAEROBIC 5CC  Final   Culture NO GROWTH 1 DAY  Final   Report Status PENDING  Incomplete  Culture, blood (routine x 2)     Status: None (Preliminary result)   Collection Time: 12/28/15 11:28 PM  Result Value Ref Range Status   Specimen Description BLOOD BLOOD LEFT FOREARM  Final  Special Requests BOTTLES DRAWN AEROBIC AND ANAEROBIC 10CC  Final   Culture NO GROWTH 1 DAY  Final   Report Status PENDING  Incomplete    Radiology Reports Ct Abdomen Pelvis Wo Contrast  12/28/2015  CLINICAL DATA:  Acute onset of upper abdominal pain, radiating to the back. Nausea and vomiting. Diarrhea. Initial encounter. EXAM: CT ABDOMEN AND PELVIS WITHOUT CONTRAST TECHNIQUE: Multidetector CT imaging of the abdomen and pelvis was performed following the standard protocol without IV contrast. COMPARISON:  CT of the abdomen and pelvis performed 11/19/2014 FINDINGS: Mild left basilar scarring is noted. There is dilatation of the left hepatic duct and common bile duct, measuring up to 1.1 cm. This raises concern for distal obstruction. The liver and spleen are otherwise unremarkable in appearance. The gallbladder is within normal limits. The pancreas and adrenal glands are unremarkable. A 2.0 cm left renal cyst is noted. Mild bilateral renal atrophy is noted. The kidneys are otherwise unremarkable. There is no evidence of hydronephrosis. No renal or ureteral stones are seen. No perinephric stranding is appreciated. No free fluid is identified. The small bowel is unremarkable in appearance. The stomach is within normal limits. No acute vascular abnormalities are seen. Scattered calcification is noted along the abdominal aorta and its branches. There is mild ectasia of the distal abdominal aorta. The appendix is normal in caliber, without evidence of appendicitis. The colon is unremarkable in appearance. The bladder is  decompressed and not well assessed. The uterus is unremarkable in appearance. The ovaries are relatively symmetric. No suspicious adnexal masses are seen. No inguinal lymphadenopathy is seen. No acute osseous abnormalities are identified. IMPRESSION: 1. No acute abnormality seen within the abdomen or pelvis. 2. Dilatation of the left hepatic duct and common bile duct, measuring up to 1.1 cm. This raises concern for distal obstruction. Would correlate with LFTs. MRCP or ERCP could be considered for further evaluation. 3. Left renal cysts noted.  Mild bilateral renal atrophy seen. 4. Mild left basilar scarring noted. 5. Scattered calcification along the abdominal aorta and its branches. Electronically Signed   By: Garald Balding M.D.   On: 12/28/2015 02:33   Nm Hepatobiliary Liver Func  12/30/2015  CLINICAL DATA:  55 year old female with acute right upper quadrant abdominal pain. EXAM: NUCLEAR MEDICINE HEPATOBILIARY IMAGING TECHNIQUE: Sequential images of the abdomen were obtained out to 60 minutes following intravenous administration of radiopharmaceutical. 2.5 mg of intravenous morphine was administered during procedure. RADIOPHARMACEUTICALS:  5.39 mCi Tc-2m Choletec IV COMPARISON:  12/28/2015 MR and CT FINDINGS: Prompt homogeneous hepatic activity is identified. Small bowel activity is noted at 1 hour. Gallbladder activity is noted on 3 hour delayed images. Prominence of the CBD is noted. IMPRESSION: Delayed gallbladder filling without evidence of cystic duct obstruction/acute cholecystitis. CBD prominence. Electronically Signed   By: Margarette Canada M.D.   On: 12/30/2015 15:02   Mr Abdomen Mrcp Wo Cm  12/28/2015  CLINICAL DATA:  55 year old female with recent history of mid epigastric pain radiating to the back since 12/27/2015. No associated nausea or vomiting. Dilated left hepatic ducts noted on CT examination. Followup study. EXAM: MRI ABDOMEN WITHOUT CONTRAST  (INCLUDING MRCP) TECHNIQUE: Multiplanar  multisequence MR imaging of the abdomen was performed. Heavily T2-weighted images of the biliary and pancreatic ducts were obtained, and three-dimensional MRCP images were rendered by post processing. COMPARISON:  Noncontrast CT the abdomen and pelvis 12/28/2015. FINDINGS: Lower chest:  Unremarkable. Hepatobiliary: Diffuse low signal intensity throughout the liver on T2 weighted sequences, indicative of iron deposition.  No definite cystic or solid hepatic lesions are confidently identified on today's noncontrast examination. MRCP images demonstrate some very mild intrahepatic biliary ductal dilatation throughout the left lobe of the liver. The left hepatic duct itself is markedly dilated 12 mm in diameter. Common hepatic duct is mildly dilated measuring 9 mm in the hepatic hilum. Common bile duct is also mildly dilated measuring up to 7 mm in the porta hepatis. There are no filling defects within the common bile duct to suggest retained ductal stones. Additionally, there are no filling defects within the gallbladder to suggest gallstones. Gallbladder is normal in appearance. Pancreas: No definite pancreatic mass identified on today's noncontrast examination. No pancreatic ductal dilatation. No pancreatic or peripancreatic fluid or inflammatory changes. Spleen: Diffuse low signal intensity throughout the spleen on T2 weighted images indicative of iron deposition. Adrenals/Urinary Tract: Bilateral adrenal glands are normal in appearance. 2 cm well-circumscribed T1 hypointense, T2 hyperintense lesion in the interpolar region of the left kidney is incompletely characterized on today's noncontrast CT examination, but is statistically likely a cyst. Right kidney is normal in appearance. No hydroureteronephrosis in the visualized abdomen. Stomach/Bowel: Visualized portions are unremarkable. Vascular/Lymphatic: No definite aneurysm identified in the abdominal vasculature. No lymphadenopathy noted in the abdomen. Other: No  significant volume of ascites in the visualized peritoneal cavity. Musculoskeletal: No aggressive osseous lesion noted in the visualized portions of the skeleton. IMPRESSION: 1. Dilatation of the left hepatic duct common common hepatic duct, and to a lesser extent the common bile duct. There is minimal associated left intrahepatic biliary ductal dilatation. These findings are unusual and are of uncertain etiology and significance, but could suggest the presence of a choledochal cyst. 2. Iron deposition in the liver and spleen. Electronically Signed   By: Vinnie Langton M.D.   On: 12/28/2015 13:20   Mr 3d Recon At Scanner  12/28/2015  CLINICAL DATA:  55 year old female with recent history of mid epigastric pain radiating to the back since 12/27/2015. No associated nausea or vomiting. Dilated left hepatic ducts noted on CT examination. Followup study. EXAM: MRI ABDOMEN WITHOUT CONTRAST  (INCLUDING MRCP) TECHNIQUE: Multiplanar multisequence MR imaging of the abdomen was performed. Heavily T2-weighted images of the biliary and pancreatic ducts were obtained, and three-dimensional MRCP images were rendered by post processing. COMPARISON:  Noncontrast CT the abdomen and pelvis 12/28/2015. FINDINGS: Lower chest:  Unremarkable. Hepatobiliary: Diffuse low signal intensity throughout the liver on T2 weighted sequences, indicative of iron deposition. No definite cystic or solid hepatic lesions are confidently identified on today's noncontrast examination. MRCP images demonstrate some very mild intrahepatic biliary ductal dilatation throughout the left lobe of the liver. The left hepatic duct itself is markedly dilated 12 mm in diameter. Common hepatic duct is mildly dilated measuring 9 mm in the hepatic hilum. Common bile duct is also mildly dilated measuring up to 7 mm in the porta hepatis. There are no filling defects within the common bile duct to suggest retained ductal stones. Additionally, there are no filling  defects within the gallbladder to suggest gallstones. Gallbladder is normal in appearance. Pancreas: No definite pancreatic mass identified on today's noncontrast examination. No pancreatic ductal dilatation. No pancreatic or peripancreatic fluid or inflammatory changes. Spleen: Diffuse low signal intensity throughout the spleen on T2 weighted images indicative of iron deposition. Adrenals/Urinary Tract: Bilateral adrenal glands are normal in appearance. 2 cm well-circumscribed T1 hypointense, T2 hyperintense lesion in the interpolar region of the left kidney is incompletely characterized on today's noncontrast CT examination, but is statistically  likely a cyst. Right kidney is normal in appearance. No hydroureteronephrosis in the visualized abdomen. Stomach/Bowel: Visualized portions are unremarkable. Vascular/Lymphatic: No definite aneurysm identified in the abdominal vasculature. No lymphadenopathy noted in the abdomen. Other: No significant volume of ascites in the visualized peritoneal cavity. Musculoskeletal: No aggressive osseous lesion noted in the visualized portions of the skeleton. IMPRESSION: 1. Dilatation of the left hepatic duct common common hepatic duct, and to a lesser extent the common bile duct. There is minimal associated left intrahepatic biliary ductal dilatation. These findings are unusual and are of uncertain etiology and significance, but could suggest the presence of a choledochal cyst. 2. Iron deposition in the liver and spleen. Electronically Signed   By: Vinnie Langton M.D.   On: 12/28/2015 13:20   Dg Chest Port 1 View  12/29/2015  CLINICAL DATA:  Shortness of breath for 2 days. EXAM: PORTABLE CHEST 1 VIEW COMPARISON:  12/21/2015 and prior chest radiographs FINDINGS: A left IJ central venous catheter is noted with tip overlying the lower SVC. The cardiomediastinal silhouette is unremarkable. Mild bibasilar atelectasis now identified. There is no evidence of pulmonary edema,  pneumothorax or large pleural effusion. IMPRESSION: Mild bibasilar atelectasis without other significant change. Electronically Signed   By: Margarette Canada M.D.   On: 12/29/2015 08:12   Dg Chest Port 1v Same Day  12/21/2015  CLINICAL DATA:  Dialysis catheter placement. EXAM: PORTABLE CHEST 1 VIEW COMPARISON:  October 07, 2015. FINDINGS: The heart size and mediastinal contours are within normal limits. Left lung is clear. Minimal right basilar subsegmental atelectasis or scarring is noted. No pneumothorax or pleural effusion is noted. Right internal jugular dialysis catheter noted on prior exam has been replaced with left internal jugular dialysis catheter, with distal tip in expected position of the SVC. The visualized skeletal structures are unremarkable. IMPRESSION: Placement of new left internal jugular dialysis catheter with distal tip in expected position of the SVC. No pneumothorax is noted. Electronically Signed   By: Marijo Conception, M.D.   On: 12/21/2015 13:07     CBC  Recent Labs Lab 12/27/15 2239 12/29/15 0644 12/30/15 0005 12/30/15 0419 12/31/15 0315  WBC 8.7 7.6 8.3 9.2 9.5  HGB 14.3 11.4* 10.2* 9.9* 10.1*  HCT 44.8 35.2* 32.5* 31.0* 31.1*  PLT 195 129* 141* 125* 145*  MCV 85.7 85.9 84.9 84.2 84.5  MCH 27.3 27.8 26.6 26.9 27.4  MCHC 31.9 32.4 31.4 31.9 32.5  RDW 17.5* 17.5* 17.5* 17.6* 17.7*    Chemistries   Recent Labs Lab 12/27/15 2239 12/29/15 0644 12/30/15 0005 12/30/15 0419 12/31/15 0315  NA 136 128* 136 138 134*  K 5.1 6.6* 4.4 4.7 4.2  CL 93* 92* 97* 100* 92*  CO2 21* 18* 22 20* 25  GLUCOSE 164* 54* 94 72 81  BUN 29* 44* 23* 24* 36*  CREATININE 6.71* 9.35* 6.49* 6.85* 8.86*  CALCIUM 10.3 8.6* 8.4* 8.6* 7.9*  MG  --   --   --   --  1.8  AST 25 24  --  25 21  ALT 15 11*  --  12* 11*  ALKPHOS 104 97  --  82 84  BILITOT 1.1 1.3*  --  1.0 0.9    ------------------------------------------------------------------------------------------------------------------ No results for input(s): CHOL, HDL, LDLCALC, TRIG, CHOLHDL, LDLDIRECT in the last 72 hours.  Lab Results  Component Value Date   HGBA1C 4.7 06/29/2015   ------------------------------------------------------------------------------------------------------------------ No results for input(s): TSH, T4TOTAL, T3FREE, THYROIDAB in the last 72 hours.  Invalid  input(s): FREET3 ------------------------------------------------------------------------------------------------------------------  Recent Labs  12/29/15 1012  VITAMINB12 932*  FOLATE 46.6  FERRITIN 670*  TIBC 182*  IRON 14*  RETICCTPCT 1.7    Coagulation profile No results for input(s): INR, PROTIME in the last 168 hours.  No results for input(s): DDIMER in the last 72 hours.  Cardiac Enzymes No results for input(s): CKMB, TROPONINI, MYOGLOBIN in the last 168 hours.  Invalid input(s): CK ------------------------------------------------------------------------------------------------------------------ No results found for: BNP  Time Spent in minutes  35   Lala Lund K M.D on 12/31/2015 at 9:22 AM  Between 7am to 7pm - Pager - [phone]  After 7pm go to [email or web address] - password [facility]  [facility] -  Office  [phone]

## 2016-01-01 ENCOUNTER — Other Ambulatory Visit: Payer: Self-pay | Admitting: *Deleted

## 2016-01-01 ENCOUNTER — Telehealth: Payer: Self-pay | Admitting: *Deleted

## 2016-01-01 DIAGNOSIS — R109 Unspecified abdominal pain: Secondary | ICD-10-CM

## 2016-01-01 LAB — RENAL FUNCTION PANEL
ANION GAP: 15 (ref 5–15)
Albumin: 2.1 g/dL — ABNORMAL LOW (ref 3.5–5.0)
BUN: 47 mg/dL — AB (ref 6–20)
CHLORIDE: 94 mmol/L — AB (ref 101–111)
CO2: 24 mmol/L (ref 22–32)
Calcium: 8.1 mg/dL — ABNORMAL LOW (ref 8.9–10.3)
Creatinine, Ser: 11.14 mg/dL — ABNORMAL HIGH (ref 0.44–1.00)
GFR, EST AFRICAN AMERICAN: 4 mL/min — AB (ref >60)
GFR, EST NON AFRICAN AMERICAN: 3 mL/min — AB (ref >60)
Glucose, Bld: 90 mg/dL (ref 65–99)
PHOSPHORUS: 6.7 mg/dL — AB (ref 2.5–4.6)
POTASSIUM: 4.8 mmol/L (ref 3.5–5.1)
Sodium: 133 mmol/L — ABNORMAL LOW (ref 135–145)

## 2016-01-01 LAB — RESPIRATORY VIRUS PANEL
ADENOVIRUS: NEGATIVE
INFLUENZA A: NEGATIVE
Influenza B: NEGATIVE
Metapneumovirus: NEGATIVE
PARAINFLUENZA 2 A: NEGATIVE
Parainfluenza 1: NEGATIVE
Parainfluenza 3: NEGATIVE
RESPIRATORY SYNCYTIAL VIRUS B: NEGATIVE
RHINOVIRUS: NEGATIVE
Respiratory Syncytial Virus A: NEGATIVE

## 2016-01-01 LAB — CBC
HEMATOCRIT: 31 % — AB (ref 36.0–46.0)
HEMOGLOBIN: 9.8 g/dL — AB (ref 12.0–15.0)
MCH: 26.5 pg (ref 26.0–34.0)
MCHC: 31.6 g/dL (ref 30.0–36.0)
MCV: 83.8 fL (ref 78.0–100.0)
Platelets: 164 10*3/uL (ref 150–400)
RBC: 3.7 MIL/uL — AB (ref 3.87–5.11)
RDW: 17.2 % — ABNORMAL HIGH (ref 11.5–15.5)
WBC: 6.7 10*3/uL (ref 4.0–10.5)

## 2016-01-01 LAB — GLUCOSE, CAPILLARY: Glucose-Capillary: 71 mg/dL (ref 65–99)

## 2016-01-01 LAB — HEPATITIS B SURFACE ANTIGEN: Hepatitis B Surface Ag: NEGATIVE

## 2016-01-01 MED ORDER — CALCITRIOL 0.25 MCG PO CAPS
ORAL_CAPSULE | ORAL | Status: AC
  Filled 2016-01-01: qty 5

## 2016-01-01 NOTE — Discharge Instructions (Signed)
Follow with Primary MD COPLAND,JESSICA, MD in 2-3 days , get final blood culture results reviewed.  Get CBC, CMP, 2 view Chest X ray checked  by Primary MD next visit.    Activity: As tolerated with Full fall precautions use walker/cane & assistance as needed   Disposition Home     Diet:   Renal - Check your Weight same time everyday, if you gain over 2 pounds, or you develop in leg swelling, experience more shortness of breath or chest pain, call your Primary MD immediately. Follow Cardiac Low Salt Diet and 1.2 lit/day fluid restriction.   On your next visit with your primary care physician please Get Medicines reviewed and adjusted.   Please request your Prim.MD to go over all Hospital Tests and Procedure/Radiological results at the follow up, please get all Hospital records sent to your Prim MD by signing hospital release before you go home.   If you experience worsening of your admission symptoms, develop shortness of breath, life threatening emergency, suicidal or homicidal thoughts you must seek medical attention immediately by calling 911 or calling your MD immediately  if symptoms less severe.  You Must read complete instructions/literature along with all the possible adverse reactions/side effects for all the Medicines you take and that have been prescribed to you. Take any new Medicines after you have completely understood and accpet all the possible adverse reactions/side effects.   Do not drive, operating heavy machinery, perform activities at heights, swimming or participation in water activities or provide baby sitting services if your were admitted for syncope or siezures until you have seen by Primary MD or a Neurologist and advised to do so again.  Do not drive when taking Pain medications.    Do not take more than prescribed Pain, Sleep and Anxiety Medications  Special Instructions: If you have smoked or chewed Tobacco  in the last 2 yrs please stop smoking, stop any  regular Alcohol  and or any Recreational drug use.  Wear Seat belts while driving.   Please note  You were cared for by a hospitalist during your hospital stay. If you have any questions about your discharge medications or the care you received while you were in the hospital after you are discharged, you can call the unit and asked to speak with the hospitalist on call if the hospitalist that took care of you is not available. Once you are discharged, your primary care physician will handle any further medical issues. Please note that NO REFILLS for any discharge medications will be authorized once you are discharged, as it is imperative that you return to your primary care physician (or establish a relationship with a primary care physician if you do not have one) for your aftercare needs so that they can reassess your need for medications and monitor your lab values.

## 2016-01-01 NOTE — Procedures (Signed)
I was present at this dialysis session. I have reviewed the session itself and made appropriate changes.   Afebrile in past 24h.  Standignw eight well above EDW, but will restrict UF given hx/o severe IDH. ABX have been stopped.  BP doingw ell so far. 2K bath K 4.8.  Qb 350 can't push to 400 2/2 high AP.  Pearson Grippe  MD 01/01/2016, 8:54 AM

## 2016-01-01 NOTE — Telephone Encounter (Signed)
Lab in EPIC. Recall in EPIC.

## 2016-01-01 NOTE — Telephone Encounter (Signed)
-----   Message from Manus Gunning, MD sent at 12/29/2015 12:23 PM EST ----- [name] [name] this patient needs a follow up MRCP and an appointment with Dr. Ardis Hughs in a few months. I saw her in the hospital but he had discussed the case with me and is considering EUS pending her MRCP findings. Can put a recall in for her. She also needs LFTs in one month which I can follow up. Thanks

## 2016-01-01 NOTE — Discharge Summary (Signed)
[name] [name], is a 55 y.o. female  DOB December 17, 1960  MRN [id number].  Admission date:  12/27/2015  Admitting Physician  Norval Morton, MD  Discharge Date:  01/01/2016   Primary MD  Lamar Blinks, MD  Recommendations for primary care physician for things to follow:   Follow final blood culture results, check CBC and BMP next visit. Close outpatient renal and GI follow-up. Recheck iron panel in 7-10 days.   Admission Diagnosis  Right upper quadrant pain [R10.11] Common bile duct dilatation [K83.8] Dilated bile duct [K83.8] Dilated intrahepatic bile duct [K83.8]   Discharge Diagnosis  Right upper quadrant pain [R10.11] Common bile duct dilatation [K83.8] Dilated bile duct [K83.8] Dilated intrahepatic bile duct [K83.8]     Principal Problem:   Epigastric pain Active Problems:   Protein-calorie malnutrition, severe (HCC)   Abdominal pain   End stage renal disease on dialysis (HCC)   Lupus (HCC)   GERD (gastroesophageal reflux disease)   Hyperlipidemia   Prolonged Q-T interval on ECG   Dilated bile duct   Abnormal CT of the abdomen      Past Medical History  Diagnosis Date  . Hypertension   . Lupus ([place])   . CKD (chronic kidney disease)     due to lupus/Dr. [name]  . Stenosis of cervical spine region     with HNP at C5/6, C6/7  . Metatarsal bone fracture right    4th  . 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 Arthritis     RA  . Anemia   . Stroke ([place]) 11/2014    left sided weakness, dysphagia  . Pain in joints   . Paresthesias   . Hyperlipidemia   . Colitis     ? per hospital notes 11/2014  . Lower GI bleed   . Hyponatremia   . Metabolic acidosis   . Hyperplastic colon polyp   . Hemorrhoids   . Blood transfusion without reported diagnosis     . Myocardial infarction ([place])   . Diabetes mellitus ([place])     border line    Past Surgical History  Procedure Laterality Date  . Breast biopsy    . Tubal ligation    . Insertion of dialysis catheter Right 02/05/2015    Procedure: INSERTION OF DIALYSIS CATHETER - RIGHT INTERNAL JUGULAR;  Surgeon: Conrad Wadley, MD;  Location: [facility];  Service: Vascular;  Laterality: Right;  . Bascilic vein transposition Left 02/14/2015    Procedure: BASILIC VEIN Fistula Creation First Stage;  Surgeon: Rosetta Posner, MD;  Location: [facility];  Service: Vascular;  Laterality: Left;  . Colonoscopy w/ biopsies and polypectomy    . Bascilic vein transposition Left 03/28/2015    Procedure: LEFT ARM SECOND STAGE BASCILIC VEIN TRANSPOSITION;  Surgeon: Rosetta Posner, MD;  Location: [facility];  Service: Vascular;  Laterality: Left;  . Revison of arteriovenous fistula Left 09/26/2015    Procedure: Revision Left Basilic  Vein Transposition with Banding using Gortex graft;  Surgeon: Mal Misty, MD;  Location: [facility];  Service: Vascular;  Laterality: Left;  . Insertion of dialysis catheter N/A 10/05/2015    Procedure: INSERTION OF DIALYSIS CATHETER Right Internal Jugular.;  Surgeon: Mal Misty, MD;  Location: [facility];  Service: Vascular;  Laterality: N/A;  . Peripheral vascular catheterization N/A 10/10/2015    Procedure: Fistulagram;  Surgeon: [name], MD;  Location: [facility];  Service: Cardiovascular;  Laterality: N/A;  . Ligation of arteriovenous  fistula Left 12/21/2015    Procedure: LIGATION OF ARTERIOVENOUS  FISTULA LEFT ARM;  Surgeon: [name], MD;  Location: [facility];  Service: Vascular;  Laterality: Left;  . Av fistula placement Right 12/21/2015    Procedure: ARTERIOVENOUS (AV) FISTULA CREATION RIGHT ARM;  Surgeon: [name], MD;  Location: [facility];  Service: Vascular;  Laterality: Right;  . Insertion of dialysis catheter Left 12/21/2015    Procedure: INSERTION OF DIALYSIS CATHETER LEFT INTERNAL  JUGULAR VEIN;  Surgeon: [name], MD;  Location[facility];  Service: Vascular;  Laterality: Left;       HPI  from the history and physical done on the day of admission:    [name] [name] is a 55 year old with a past medical history of diabetes mellitus type 2, ESRD on HD T/Th/Sat, lupus, HTN; who presents with complaints of epigastric pain. Symptoms started acutely yesterday(2/23) morning at approximately 9 AM. Pain was sharp and located in the epigastric region. Pain radiated around to her back. Nothing really helped to make symptoms better except for pain medications. Hemodialysis seemed to worsen pain symptoms. He reports eating one time around 1 PM yesterday for which she had one episode of nausea and vomiting. She rates the pain a 9.5 out of 10 on the pain scale. She reports associated symptoms of constipation and loose stools after using stool softeners. Patient notes that she had been receiving hemodialysis for the last 3 days consecutively and she has still syndrome, but states that she returns to her regular schedule and will need dialysis on Saturday.  Upon admission to the emergency department patient was evaluated with a CT scan of the abdomen which showed dilatation of the left hepatic and common bile duct measuring up to 1.1 cm.      Hospital Course:     1. Upper abdominal pain. No febrile. CT and MRI confirmed CBD, left hepatic and common hepatic duct dilation. HIDA scan was unremarkable, Liver enzymes along with total bilirubin stable, no abdominal pain, now afebrile. Due to fevers on 12/29/2015 for now continue on empiric antibiotics until cultures finalize, GI was on board and has signed off, we will have her follow with GI one time outpatient after discharge as well.  2. Fever early morning 12/29/2015. Blood cultures obtained and negative thus far, this could have been a viral illness, Chest x-ray stable, ESRD patient who makes minimal to no urine, influenza panel negative, will  stop all empiric IV antibiotics, she appears nontoxic. Denies any body aches, no cough, pain or discomfort. We'll request PCP to check final blood culture results next visit.   3. ESRD. On Tuesday-Thursday-Saturday dialysis schedule. Renal informed.   4. History of lupus. Currently on plaquenil, continue, outpatient follow-up with rheumatology and ophthalmology as needed.   5. Iron deposits on liver and spleen noted on CT scan. Ferritin 670, check Transferrin was low. Stop Iron supplementation. Repeat iron Panel in 4 weeks.  6. Prolonged QTC upon admission. Resolved.   7.GERD - on PPI   8. Chronic pain and neuropathy. On Lyrica continue along with as needed oxycodone.   7. Dyslipidemia. On statin.       Discharge Condition:Stable  Follow UP  Follow-up Information    Follow up with COPLAND,JESSICA, MD. Schedule an appointment as soon as possible for a visit in 1 week.   Specialty:  Family Medicine   Contact information:   [place] [id number] [phone]       Follow up with Manus Gunning, MD. Schedule an appointment as soon as possible for a visit in 2 weeks.   Specialty:  Gastroenterology   Why:  CMP check   Contact information:   [address] [place] [address] [phone]        Consults obtained - GI, renal, D/W [facility] Dr Molli Posey 12-30-15  Diet and Activity recommendation: See Discharge Instructions below  Discharge Instructions       Discharge Instructions    Discharge instructions    Complete by:  As directed   Follow with Primary MD COPLAND,JESSICA, MD in 2-3 days , get final blood culture results reviewed.  Get CBC, CMP, 2 view Chest X ray checked  by Primary MD next visit.    Activity: As tolerated with Full fall precautions use walker/cane & assistance as needed   Disposition Home     Diet:   Renal - Check your Weight same time everyday, if you gain over 2 pounds, or you develop in leg swelling,  experience more shortness of breath or chest pain, call your Primary MD immediately. Follow Cardiac Low Salt Diet and 1.2 lit/day fluid restriction.   On your next visit with your primary care physician please Get Medicines reviewed and adjusted.   Please request your Prim.MD to go over all Hospital Tests and Procedure/Radiological results at the follow up, please get all Hospital records sent to your Prim MD by signing hospital release before you go home.   If you experience worsening of your admission symptoms, develop shortness of breath, life threatening emergency, suicidal or homicidal thoughts you must seek medical attention immediately by calling 911 or calling your MD immediately  if symptoms less severe.  You Must read complete instructions/literature along with all the possible adverse reactions/side effects for all the Medicines you take and that have been prescribed to you. Take any new Medicines after you have completely understood and accpet all the possible adverse reactions/side effects.   Do not drive, operating heavy machinery, perform activities at heights, swimming or participation in water activities or provide baby sitting services if your were admitted for syncope or siezures until you have seen by Primary MD or a Neurologist and advised to do so again.  Do not drive when taking Pain medications.    Do not take more than prescribed Pain, Sleep and Anxiety Medications  Special Instructions: If you have smoked or chewed Tobacco  in the last 2 yrs please stop smoking, stop any regular Alcohol  and or any Recreational drug use.  Wear Seat belts while driving.   Please note  You were cared for by a hospitalist during your hospital stay. If you have any questions about your discharge medications or the care you received while you were in the hospital after you are discharged, you can call the unit and asked to speak with the hospitalist on call if the hospitalist that took  care of  you is not available. Once you are discharged, your primary care physician will handle any further medical issues. Please note that NO REFILLS for any discharge medications will be authorized once you are discharged, as it is imperative that you return to your primary care physician (or establish a relationship with a primary care physician if you do not have one) for your aftercare needs so that they can reassess your need for medications and monitor your lab values.     Increase activity slowly    Complete by:  As directed              Discharge Medications       Medication List    STOP taking these medications        HYDROcodone-acetaminophen 7.5-325 MG tablet  Commonly known as:  NORCO     oxyCODONE-acetaminophen 5-325 MG tablet  Commonly known as:  PERCOCET/ROXICET      TAKE these medications        acetaminophen 325 MG tablet  Commonly known as:  TYLENOL  Take 2 tablets (650 mg total) by mouth every 4 (four) hours as needed for mild pain.     ALPRAZolam 0.25 MG tablet  Commonly known as:  XANAX  Take 1 tablet (0.25 mg total) by mouth every 8 (eight) hours as needed for anxiety (0.25mg -0.5mg ).     cinacalcet 30 MG tablet  Commonly known as:  SENSIPAR  Take 30 mg by mouth daily.     docusate sodium 50 MG capsule  Commonly known as:  COLACE  Take 50 mg by mouth 2 (two) times daily. Reported on 12/12/2015     hydroxychloroquine 200 MG tablet  Commonly known as:  PLAQUENIL  Take 1 tablet (200 mg total) by mouth 2 (two) times daily.     lidocaine-prilocaine cream  Commonly known as:  EMLA  Apply 1 application topically as needed.     multivitamin Tabs tablet  Take 1 tablet by mouth daily.     pantoprazole 40 MG tablet  Commonly known as:  PROTONIX  Take 1 tablet (40 mg total) by mouth daily.     pravastatin 20 MG tablet  Commonly known as:  PRAVACHOL  Take 20 mg by mouth daily.     pregabalin 75 MG capsule  Commonly known as:  LYRICA  Take 1  capsule (75 mg total) by mouth daily.     RENVELA 2.4 g Pack  Generic drug:  sevelamer carbonate  Take 2.4-4.8 g by mouth 3 (three) times daily with meals. 2 PACKS WITH MEALS 3 TIMES DAILY, 1 PACK WITH SNACKS     temazepam 30 MG capsule  Commonly known as:  RESTORIL  Take 30 mg by mouth at bedtime as needed for sleep. Reported on 12/12/2015     thiamine 100 MG tablet  Take 1 tablet (100 mg total) by mouth daily.        Major procedures and Radiology Reports - PLEASE review detailed and final reports for all details, in brief -     MRCP - CT Abd - dilation of left hepatic duct and common hepatic duct and CBD. And a positive and an liver and spleen. Left renal cyst.  HIDA - unremarkable   Ct Abdomen Pelvis Wo Contrast  12/28/2015  CLINICAL DATA:  Acute onset of upper abdominal pain, radiating to the back. Nausea and vomiting. Diarrhea. Initial encounter. EXAM: CT ABDOMEN AND PELVIS WITHOUT CONTRAST TECHNIQUE: Multidetector CT imaging of the abdomen and pelvis was performed  following the standard protocol without IV contrast. COMPARISON:  CT of the abdomen and pelvis performed 11/19/2014 FINDINGS: Mild left basilar scarring is noted. There is dilatation of the left hepatic duct and common bile duct, measuring up to 1.1 cm. This raises concern for distal obstruction. The liver and spleen are otherwise unremarkable in appearance. The gallbladder is within normal limits. The pancreas and adrenal glands are unremarkable. A 2.0 cm left renal cyst is noted. Mild bilateral renal atrophy is noted. The kidneys are otherwise unremarkable. There is no evidence of hydronephrosis. No renal or ureteral stones are seen. No perinephric stranding is appreciated. No free fluid is identified. The small bowel is unremarkable in appearance. The stomach is within normal limits. No acute vascular abnormalities are seen. Scattered calcification is noted along the abdominal aorta and its branches. There is mild ectasia  of the distal abdominal aorta. The appendix is normal in caliber, without evidence of appendicitis. The colon is unremarkable in appearance. The bladder is decompressed and not well assessed. The uterus is unremarkable in appearance. The ovaries are relatively symmetric. No suspicious adnexal masses are seen. No inguinal lymphadenopathy is seen. No acute osseous abnormalities are identified. IMPRESSION: 1. No acute abnormality seen within the abdomen or pelvis. 2. Dilatation of the left hepatic duct and common bile duct, measuring up to 1.1 cm. This raises concern for distal obstruction. Would correlate with LFTs. MRCP or ERCP could be considered for further evaluation. 3. Left renal cysts noted.  Mild bilateral renal atrophy seen. 4. Mild left basilar scarring noted. 5. Scattered calcification along the abdominal aorta and its branches. Electronically Signed   By: Garald Balding M.D.   On: 12/28/2015 02:33   Nm Hepatobiliary Liver Func  12/30/2015  CLINICAL DATA:  55 year old female with acute right upper quadrant abdominal pain. EXAM: NUCLEAR MEDICINE HEPATOBILIARY IMAGING TECHNIQUE: Sequential images of the abdomen were obtained out to 60 minutes following intravenous administration of radiopharmaceutical. 2.5 mg of intravenous morphine was administered during procedure. RADIOPHARMACEUTICALS:  5.39 mCi Tc-3m Choletec IV COMPARISON:  12/28/2015 MR and CT FINDINGS: Prompt homogeneous hepatic activity is identified. Small bowel activity is noted at 1 hour. Gallbladder activity is noted on 3 hour delayed images. Prominence of the CBD is noted. IMPRESSION: Delayed gallbladder filling without evidence of cystic duct obstruction/acute cholecystitis. CBD prominence. Electronically Signed   By: Margarette Canada M.D.   On: 12/30/2015 15:02   Mr Abdomen Mrcp Wo Cm  12/28/2015  CLINICAL DATA:  55 year old female with recent history of mid epigastric pain radiating to the back since 12/27/2015. No associated nausea or  vomiting. Dilated left hepatic ducts noted on CT examination. Followup study. EXAM: MRI ABDOMEN WITHOUT CONTRAST  (INCLUDING MRCP) TECHNIQUE: Multiplanar multisequence MR imaging of the abdomen was performed. Heavily T2-weighted images of the biliary and pancreatic ducts were obtained, and three-dimensional MRCP images were rendered by post processing. COMPARISON:  Noncontrast CT the abdomen and pelvis 12/28/2015. FINDINGS: Lower chest:  Unremarkable. Hepatobiliary: Diffuse low signal intensity throughout the liver on T2 weighted sequences, indicative of iron deposition. No definite cystic or solid hepatic lesions are confidently identified on today's noncontrast examination. MRCP images demonstrate some very mild intrahepatic biliary ductal dilatation throughout the left lobe of the liver. The left hepatic duct itself is markedly dilated 12 mm in diameter. Common hepatic duct is mildly dilated measuring 9 mm in the hepatic hilum. Common bile duct is also mildly dilated measuring up to 7 mm in the porta hepatis. There are no filling  defects within the common bile duct to suggest retained ductal stones. Additionally, there are no filling defects within the gallbladder to suggest gallstones. Gallbladder is normal in appearance. Pancreas: No definite pancreatic mass identified on today's noncontrast examination. No pancreatic ductal dilatation. No pancreatic or peripancreatic fluid or inflammatory changes. Spleen: Diffuse low signal intensity throughout the spleen on T2 weighted images indicative of iron deposition. Adrenals/Urinary Tract: Bilateral adrenal glands are normal in appearance. 2 cm well-circumscribed T1 hypointense, T2 hyperintense lesion in the interpolar region of the left kidney is incompletely characterized on today's noncontrast CT examination, but is statistically likely a cyst. Right kidney is normal in appearance. No hydroureteronephrosis in the visualized abdomen. Stomach/Bowel: Visualized  portions are unremarkable. Vascular/Lymphatic: No definite aneurysm identified in the abdominal vasculature. No lymphadenopathy noted in the abdomen. Other: No significant volume of ascites in the visualized peritoneal cavity. Musculoskeletal: No aggressive osseous lesion noted in the visualized portions of the skeleton. IMPRESSION: 1. Dilatation of the left hepatic duct common common hepatic duct, and to a lesser extent the common bile duct. There is minimal associated left intrahepatic biliary ductal dilatation. These findings are unusual and are of uncertain etiology and significance, but could suggest the presence of a choledochal cyst. 2. Iron deposition in the liver and spleen. Electronically Signed   By: Vinnie Langton M.D.   On: 12/28/2015 13:20   Mr 3d Recon At Scanner  12/28/2015  CLINICAL DATA:  55 year old female with recent history of mid epigastric pain radiating to the back since 12/27/2015. No associated nausea or vomiting. Dilated left hepatic ducts noted on CT examination. Followup study. EXAM: MRI ABDOMEN WITHOUT CONTRAST  (INCLUDING MRCP) TECHNIQUE: Multiplanar multisequence MR imaging of the abdomen was performed. Heavily T2-weighted images of the biliary and pancreatic ducts were obtained, and three-dimensional MRCP images were rendered by post processing. COMPARISON:  Noncontrast CT the abdomen and pelvis 12/28/2015. FINDINGS: Lower chest:  Unremarkable. Hepatobiliary: Diffuse low signal intensity throughout the liver on T2 weighted sequences, indicative of iron deposition. No definite cystic or solid hepatic lesions are confidently identified on today's noncontrast examination. MRCP images demonstrate some very mild intrahepatic biliary ductal dilatation throughout the left lobe of the liver. The left hepatic duct itself is markedly dilated 12 mm in diameter. Common hepatic duct is mildly dilated measuring 9 mm in the hepatic hilum. Common bile duct is also mildly dilated measuring up to  7 mm in the porta hepatis. There are no filling defects within the common bile duct to suggest retained ductal stones. Additionally, there are no filling defects within the gallbladder to suggest gallstones. Gallbladder is normal in appearance. Pancreas: No definite pancreatic mass identified on today's noncontrast examination. No pancreatic ductal dilatation. No pancreatic or peripancreatic fluid or inflammatory changes. Spleen: Diffuse low signal intensity throughout the spleen on T2 weighted images indicative of iron deposition. Adrenals/Urinary Tract: Bilateral adrenal glands are normal in appearance. 2 cm well-circumscribed T1 hypointense, T2 hyperintense lesion in the interpolar region of the left kidney is incompletely characterized on today's noncontrast CT examination, but is statistically likely a cyst. Right kidney is normal in appearance. No hydroureteronephrosis in the visualized abdomen. Stomach/Bowel: Visualized portions are unremarkable. Vascular/Lymphatic: No definite aneurysm identified in the abdominal vasculature. No lymphadenopathy noted in the abdomen. Other: No significant volume of ascites in the visualized peritoneal cavity. Musculoskeletal: No aggressive osseous lesion noted in the visualized portions of the skeleton. IMPRESSION: 1. Dilatation of the left hepatic duct common common hepatic duct, and to a lesser extent  the common bile duct. There is minimal associated left intrahepatic biliary ductal dilatation. These findings are unusual and are of uncertain etiology and significance, but could suggest the presence of a choledochal cyst. 2. Iron deposition in the liver and spleen. Electronically Signed   By: Vinnie Langton M.D.   On: 12/28/2015 13:20   Dg Chest Port 1 View  12/29/2015  CLINICAL DATA:  Shortness of breath for 2 days. EXAM: PORTABLE CHEST 1 VIEW COMPARISON:  12/21/2015 and prior chest radiographs FINDINGS: A left IJ central venous catheter is noted with tip overlying the  lower SVC. The cardiomediastinal silhouette is unremarkable. Mild bibasilar atelectasis now identified. There is no evidence of pulmonary edema, pneumothorax or large pleural effusion. IMPRESSION: Mild bibasilar atelectasis without other significant change. Electronically Signed   By: Margarette Canada M.D.   On: 12/29/2015 08:12   Dg Chest Port 1v Same Day  12/21/2015  CLINICAL DATA:  Dialysis catheter placement. EXAM: PORTABLE CHEST 1 VIEW COMPARISON:  October 07, 2015. FINDINGS: The heart size and mediastinal contours are within normal limits. Left lung is clear. Minimal right basilar subsegmental atelectasis or scarring is noted. No pneumothorax or pleural effusion is noted. Right internal jugular dialysis catheter noted on prior exam has been replaced with left internal jugular dialysis catheter, with distal tip in expected position of the SVC. The visualized skeletal structures are unremarkable. IMPRESSION: Placement of new left internal jugular dialysis catheter with distal tip in expected position of the SVC. No pneumothorax is noted. Electronically Signed   By: Marijo Conception, M.D.   On: 12/21/2015 13:07    Micro Results      Recent Results (from the past 240 hour(s))  MRSA PCR Screening     Status: Abnormal   Collection Time: 12/28/15  4:33 PM  Result Value Ref Range Status   MRSA by PCR POSITIVE (A) NEGATIVE Final    Comment:        The GeneXpert MRSA Assay (FDA approved for NASAL specimens only), is one component of a comprehensive MRSA colonization surveillance program. It is not intended to diagnose MRSA infection nor to guide or monitor treatment for MRSA infections. RESULT CALLED TO, READ BACK BY AND VERIFIED WITH: C.HANCOCK,RN AT 1913 BY L.PITT 12/28/15   Culture, blood (routine x 2)     Status: None (Preliminary result)   Collection Time: 12/28/15 11:10 PM  Result Value Ref Range Status   Specimen Description BLOOD LEFT ARM  Final   Special Requests BOTTLES DRAWN AEROBIC  AND ANAEROBIC 5CC  Final   Culture NO GROWTH 2 DAYS  Final   Report Status PENDING  Incomplete  Culture, blood (routine x 2)     Status: None (Preliminary result)   Collection Time: 12/28/15 11:28 PM  Result Value Ref Range Status   Specimen Description BLOOD BLOOD LEFT FOREARM  Final   Special Requests BOTTLES DRAWN AEROBIC AND ANAEROBIC 10CC  Final   Culture NO GROWTH 2 DAYS  Final   Report Status PENDING  Incomplete       Today   Subjective    Jerald Kimler today has no headache,no chest abdominal pain,no new weakness tingling or numbness, feels much better wants to go home today.     Objective   Blood pressure 117/78, pulse 70, temperature 98.3 F (36.8 C), temperature source Oral, resp. rate 14, height 5\' 5"  (1.651 m), weight 66.8 kg (147 lb 4.3 oz), last menstrual period 07/18/2012, SpO2 99 %.   Intake/Output Summary (Last  24 hours) at 01/01/16 0940 Last data filed at 01/01/16 0600  Gross per 24 hour  Intake   1060 ml  Output     20 ml  Net   1040 ml    Exam Awake Alert, Oriented x 3, No new F.N deficits, Normal affect [place].AT,PERRAL Supple Neck,No JVD, No cervical lymphadenopathy appriciated.  Symmetrical Chest wall movement, Good air movement bilaterally, CTAB RRR,No Gallops,Rubs or new Murmurs, No Parasternal Heave +ve B.Sounds, Abd Soft, Non tender, No organomegaly appriciated, No rebound -guarding or rigidity. No Cyanosis, Clubbing or edema, No new Rash or bruise   Data Review   CBC w Diff: Lab Results  Component Value Date   WBC 6.7 01/01/2016   WBC 5.5 03/01/2012   HGB 9.8* 01/01/2016   HGB 9.0* 03/01/2012   HCT 31.0* 01/01/2016   HCT 29.3* 03/01/2012   PLT 164 01/01/2016   LYMPHOPCT 19 02/20/2015   MONOPCT 13* 02/20/2015   EOSPCT 1 02/20/2015   BASOPCT 0 02/20/2015    CMP: Lab Results  Component Value Date   NA 133* 01/01/2016   K 4.8 01/01/2016   CL 94* 01/01/2016   CO2 24 01/01/2016   BUN 47* 01/01/2016   CREATININE 11.14* 01/01/2016     CREATININE 9.61* 06/29/2015   CREATININE 0.35* 10/23/2008   PROT 6.5 12/31/2015   ALBUMIN 2.1* 01/01/2016   BILITOT 0.9 12/31/2015   ALKPHOS 84 12/31/2015   AST 21 12/31/2015   ALT 11* 12/31/2015  .   Total Time in preparing paper work, data evaluation and todays exam - 35 minutes  Thurnell Lose M.D on 01/01/2016 at 9:40 AM  [facility]   Office  [phone]

## 2016-01-01 NOTE — Progress Notes (Signed)
Nsg Discharge Note  Admit Date:  12/27/2015 Discharge date: 01/01/2016   [name] to be D/C'd home per MD order.  AVS completed.  Copy for chart, and copy for patient signed, and dated. Patient able to verbalize understanding. No questions or concerns voiced when asked.   Discharge Medication:   Medication List    STOP taking these medications        HYDROcodone-acetaminophen 7.5-325 MG tablet  Commonly known as:  NORCO     oxyCODONE-acetaminophen 5-325 MG tablet  Commonly known as:  PERCOCET/ROXICET      TAKE these medications        acetaminophen 325 MG tablet  Commonly known as:  TYLENOL  Take 2 tablets (650 mg total) by mouth every 4 (four) hours as needed for mild pain.     ALPRAZolam 0.25 MG tablet  Commonly known as:  XANAX  Take 1 tablet (0.25 mg total) by mouth every 8 (eight) hours as needed for anxiety (0.25mg -0.5mg ).     cinacalcet 30 MG tablet  Commonly known as:  SENSIPAR  Take 30 mg by mouth daily.     docusate sodium 50 MG capsule  Commonly known as:  COLACE  Take 50 mg by mouth 2 (two) times daily. Reported on 12/12/2015     hydroxychloroquine 200 MG tablet  Commonly known as:  PLAQUENIL  Take 1 tablet (200 mg total) by mouth 2 (two) times daily.     lidocaine-prilocaine cream  Commonly known as:  EMLA  Apply 1 application topically as needed.     multivitamin Tabs tablet  Take 1 tablet by mouth daily.     pantoprazole 40 MG tablet  Commonly known as:  PROTONIX  Take 1 tablet (40 mg total) by mouth daily.     pravastatin 20 MG tablet  Commonly known as:  PRAVACHOL  Take 20 mg by mouth daily.     pregabalin 75 MG capsule  Commonly known as:  LYRICA  Take 1 capsule (75 mg total) by mouth daily.     RENVELA 2.4 g Pack  Generic drug:  sevelamer carbonate  Take 2.4-4.8 g by mouth 3 (three) times daily with meals. 2 PACKS WITH MEALS 3 TIMES DAILY, 1 PACK WITH SNACKS     temazepam 30 MG capsule  Commonly known as:  RESTORIL  Take 30 mg by  mouth at bedtime as needed for sleep. Reported on 12/12/2015     thiamine 100 MG tablet  Take 1 tablet (100 mg total) by mouth daily.        Discharge Assessment: Filed Vitals:   01/01/16 1120 01/01/16 1334  BP: 105/66 106/75  Pulse: 83 96  Temp: 97.8 F (36.6 C) 98.8 F (37.1 C)  Resp: 18 18   Skin clean, dry and intact without evidence of skin break down, no evidence of skin tears noted. IV catheter discontinued with catheter tip intact. Site without signs and symptoms of complications - no redness or edema noted at insertion site, patient denies c/o pain - only slight tenderness at site.  Dressing with slight pressure applied.  D/c Instructions-Education: Discharge instructions given to patient with verbalized understanding. D/c education completed with patient including follow up instructions, medication list, d/c activities limitations if indicated, with other d/c instructions as indicated by MD - patient able to verbalize understanding, all questions fully answered. Patient instructed to return to ED, call 911, or call MD for any changes in condition.  Patient told to call RN for wheelchair once  pt is dressed and ride has arrived to pick pt up. Pt verbalized understanding.   Dorita Fray, RN 01/01/2016 2:03PM

## 2016-01-02 ENCOUNTER — Telehealth: Payer: Self-pay | Admitting: *Deleted

## 2016-01-02 LAB — HEPATITIS B SURFACE ANTIGEN: Hepatitis B Surface Ag: NEGATIVE

## 2016-01-02 NOTE — Telephone Encounter (Signed)
Unable to reach patient at time of TCM call. Left message for patient to return call when available.   

## 2016-01-03 LAB — CULTURE, BLOOD (ROUTINE X 2)
CULTURE: NO GROWTH
Culture: NO GROWTH

## 2016-01-03 NOTE — Telephone Encounter (Signed)
Pt was on her way to dialysis at time of call and unable to participate in TCM info. Stated she will not be home from dialysis until 5pm, at which time our office will be closed so she will not be able to call back for TCM. Hospital follow up appt scheduled for 01/07/16 at 11:30am w/ Dr. Lorelei Pont. Pt will see Dr. Lorelei Pont for hosp follow-up, but voiced that she interested in changing PCPs now that Dr. Lorelei Pont has moved offices and continuing to see a provider at [facility] as this is more conveniently located for her.

## 2016-01-03 NOTE — Telephone Encounter (Addendum)
Updated: pt not eligible for TCM due to diagnosis of ESRD, but does still need hospital follow-up appt.

## 2016-01-07 ENCOUNTER — Ambulatory Visit (INDEPENDENT_AMBULATORY_CARE_PROVIDER_SITE_OTHER): Payer: Managed Care, Other (non HMO) | Admitting: Family Medicine

## 2016-01-07 ENCOUNTER — Ambulatory Visit (HOSPITAL_BASED_OUTPATIENT_CLINIC_OR_DEPARTMENT_OTHER)
Admission: RE | Admit: 2016-01-07 | Discharge: 2016-01-07 | Disposition: A | Discharge status: Home / Self Care | Payer: Managed Care, Other (non HMO) | Source: Ambulatory Visit | Attending: Family Medicine | Admitting: Family Medicine

## 2016-01-07 ENCOUNTER — Telehealth: Payer: Self-pay | Admitting: Emergency Medicine

## 2016-01-07 ENCOUNTER — Encounter: Payer: Self-pay | Admitting: Family Medicine

## 2016-01-07 VITALS — BP 120/78 | HR 87 | Temp 98.2°F | Ht 65.0 in | Wt 141.4 lb

## 2016-01-07 DIAGNOSIS — Z09 Encounter for follow-up examination after completed treatment for conditions other than malignant neoplasm: Secondary | ICD-10-CM | POA: Diagnosis not present

## 2016-01-07 DIAGNOSIS — R0989 Other specified symptoms and signs involving the circulatory and respiratory systems: Secondary | ICD-10-CM

## 2016-01-07 DIAGNOSIS — G609 Hereditary and idiopathic neuropathy, unspecified: Secondary | ICD-10-CM

## 2016-01-07 DIAGNOSIS — J9811 Atelectasis: Secondary | ICD-10-CM | POA: Insufficient documentation

## 2016-01-07 DIAGNOSIS — E1122 Type 2 diabetes mellitus with diabetic chronic kidney disease: Secondary | ICD-10-CM | POA: Diagnosis not present

## 2016-01-07 DIAGNOSIS — Z992 Dependence on renal dialysis: Secondary | ICD-10-CM | POA: Diagnosis not present

## 2016-01-07 DIAGNOSIS — N186 End stage renal disease: Secondary | ICD-10-CM

## 2016-01-07 LAB — COMPREHENSIVE METABOLIC PANEL
ALK PHOS: 91 U/L (ref 39–117)
ALT: 14 U/L (ref 0–35)
AST: 23 U/L (ref 0–37)
Albumin: 3.6 g/dL (ref 3.5–5.2)
BUN: 45 mg/dL — AB (ref 6–23)
CO2: 27 mEq/L (ref 19–32)
CREATININE: 9.1 mg/dL — AB (ref 0.40–1.20)
Calcium: 9.5 mg/dL (ref 8.4–10.5)
Chloride: 96 mEq/L (ref 96–112)
GFR: 5.82 mL/min — CL (ref >60.00)
Glucose, Bld: 85 mg/dL (ref 70–99)
Potassium: 4.6 mEq/L (ref 3.5–5.1)
SODIUM: 138 meq/L (ref 135–145)
TOTAL PROTEIN: 7.9 g/dL (ref 6.0–8.3)
Total Bilirubin: 0.4 mg/dL (ref 0.2–1.2)

## 2016-01-07 LAB — CBC
HCT: 34.8 % — ABNORMAL LOW (ref 36.0–46.0)
Hemoglobin: 11.1 g/dL — ABNORMAL LOW (ref 12.0–15.0)
MCHC: 31.8 g/dL (ref 30.0–36.0)
MCV: 84.9 fl (ref 78.0–100.0)
Platelets: 259 10*3/uL (ref 150.0–400.0)
RBC: 4.1 Mil/uL (ref 3.87–5.11)
RDW: 19.2 % — ABNORMAL HIGH (ref 11.5–15.5)
WBC: 7.1 10*3/uL (ref 4.0–10.5)

## 2016-01-07 LAB — HEMOGLOBIN A1C: HEMOGLOBIN A1C: 5 % (ref 4.6–6.5)

## 2016-01-07 MED ORDER — PREGABALIN 75 MG PO CAPS: 75.0000 mg | ORAL_CAPSULE | Freq: Two times a day (BID) | ORAL | Status: DC

## 2016-01-07 MED ORDER — HYDROCODONE-ACETAMINOPHEN 5-325 MG PO TABS: ORAL_TABLET | ORAL | Status: DC

## 2016-01-07 NOTE — Telephone Encounter (Signed)
[id number] called from [facility] w/critical results, Creat 9.10&GFR 5.82

## 2016-01-07 NOTE — Progress Notes (Signed)
Pre visit review using our clinic review tool, if applicable. No additional management support is needed unless otherwise documented below in the visit note. 

## 2016-01-07 NOTE — Patient Instructions (Signed)
It was very nice to see you today! I hope that you continue to feel better Please have your blood drawn and then go downstairs for a chest x-ray; I will be in touch with your results asap I refilled your pain medication to use for after dialysis pain We are going to increase your lyrica to twice a day to see if this will help more with your extremity pain  Please plan to be seen in about 3 months.  You are welcome to see me and/ or [facility]; However, please do pick one provider to give you your controlled substances (pain medication)

## 2016-01-07 NOTE — Progress Notes (Signed)
[place] at [facility] [address] [place], [place] [address] [phone]  Date:  01/07/2016   Name:  [name] [name]   DOB:  Feb 03, 1961   MRN:  [id number]  PCP:  Lamar Blinks, MD    Chief Complaint: Hospitalization Follow-up   History of Present Illness:  [name] [name] is a 55 y.o. very pleasant female patient who presents with the following:  Here today for a hospital follow-up; she was inpt from 12/23 to 12/28 with abd pain and CBD dilatation.  Her work- up was ultimately negative and she was allowed to go home.   She has been on dialysis for about one year now. Her fistula was recently changed from the left to the right arm.   Lab Results  Component Value Date   HGBA1C 4.7 06/29/2015   History of well controlled DM- not on any medications however and her A1c has been quite normal.   Nephrologist - Dr. Jimmy Footman GI - Dr. Edison Nasuti Vascular surgeon - Dr. Arita Miss PCP - Dr. Lorelei Pont  She is feeling a lot better. She does dialysis on tues/ thursd/sat on [employer]  Her hands tends to tingle and feel cold.  She feels like this started in her left arm after her port was placed and is now on the left as well.  She does have some of the same in her left food, a little on the right. She has noted these sx for about a year.  She started on lyrica a few months ago- just 75mg  a day.  So far she has not noted much difference  She does use hydrococdone as needed for more severe pain- she uses this generally after dialysis.  For whatever reason she will have more pain following dialysis.  Reviewed [place] and noted that she has received hydrocodone (once oxycodone) about once a month. She as used a couple of different providers for this over the last 6 months or so  She does have xanax and also restoril on her med list- she endorses that she uses these medications rarely and knows not to combine them with pain medications.   Patient Active  Problem List   Diagnosis Date Noted  . Dilated bile duct   . Abnormal CT of the abdomen   . Abdominal pain 12/28/2015  . Epigastric pain 12/28/2015  . End stage renal disease on dialysis ([place]) 12/28/2015  . Lupus ([place]) 12/28/2015  . GERD (gastroesophageal reflux disease) 12/28/2015  . Hyperlipidemia 12/28/2015  . Prolonged Q-T interval on ECG 12/28/2015  . Steal syndrome as complication of dialysis access ([place]) 09/21/2015  . History of CVA (cerebrovascular accident) 02/20/2015  . Debilitated 02/15/2015  . Acute respiratory failure ([place])   . SVT (supraventricular tachycardia) ([place])   . Acute diastolic CHF (congestive heart failure) ([place])   . Encephalopathy acute   . Atelectasis, left   . Confusion   . ARDS (adult respiratory distress syndrome) ([place]) 01/22/2015  . Tachycardia   . Weakness 01/19/2015  . Elevated troponin level 01/19/2015  . Hypotension 01/19/2015  . Shortness of breath 01/18/2015  . Hemorrhoid 12/07/2014  . HCAP (healthcare-associated pneumonia) 11/23/2014  . Fever   . Bleeding gastrointestinal   . Tremor 11/21/2014  . Protein-calorie malnutrition, severe ([place]) 11/20/2014  . Noninfectious gastroenteritis and colitis 11/19/2014  . Acute blood loss anemia   . Hyponatremia   . 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 Secondary cardiomyopathy ([place])   .  Noncompliance with medication regimen   . Tobacco abuse   . Sepsis ([place]) 10/10/2014  . Leucocytosis 10/10/2014  . Fluid overload 10/10/2014  . SIRS (systemic inflammatory response syndrome) ([place]) 10/10/2014  . Anemia 10/10/2014  . Hyperkalemia 10/10/2014  . Acute encephalopathy 10/10/2014  . Torticollis, acquired 10/06/2014  . Essential hypertension 10/05/2014  . Acute respiratory acidosis   . ICH (intracerebral hemorrhage) ([place])   . Chronic ankle pain   . Cerebral hemorrhage ([place])   . 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 Hypertensive emergency 09/28/2014  . Iron deficiency anemia 02/18/2008  .  Mononeuritis of lower limb 02/18/2008  . History of adrenal insufficiency 02/18/2008  . DIABETES MELLITUS, TYPE II 10/25/2007  . HYPERLIPIDEMIA 10/25/2007  . Systemic lupus erythematosus ([place]) 10/25/2007  . Rheumatoid arthritis ([place]) 10/25/2007  . COLONIC POLYPS, HYPERPLASTIC 04/08/2006    Past Medical History  Diagnosis Date  . Hypertension   . Lupus ([place])   . CKD (chronic kidney disease)     due to lupus/Dr. [name]  . Stenosis of cervical spine region     with HNP at C5/6, C6/7  . Metatarsal bone fracture right    4th  . 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 Arthritis     RA  . Anemia   . Stroke ([place]) 11/2014    left sided weakness, dysphagia  . Pain in joints   . Paresthesias   . Hyperlipidemia   . Colitis     ? per hospital notes 11/2014  . Lower GI bleed   . Hyponatremia   . Metabolic acidosis   . Hyperplastic colon polyp   . Hemorrhoids   . Blood transfusion without reported diagnosis   . Myocardial infarction ([place])   . Diabetes mellitus ([place])     border line    Past Surgical History  Procedure Laterality Date  . Breast biopsy    . Tubal ligation    . Insertion of dialysis catheter Right 02/05/2015    Procedure: INSERTION OF DIALYSIS CATHETER - RIGHT INTERNAL JUGULAR;  Surgeon: Conrad Haring, MD;  Location: [place];  Service: Vascular;  Laterality: Right;  . Bascilic vein transposition Left 02/14/2015    Procedure: BASILIC VEIN Fistula Creation First Stage;  Surgeon: Rosetta Posner, MD;  Location: [place];  Service: Vascular;  Laterality: Left;  . Colonoscopy w/ biopsies and polypectomy    . Bascilic vein transposition Left 03/28/2015    Procedure: LEFT ARM SECOND STAGE BASCILIC VEIN TRANSPOSITION;  Surgeon: Rosetta Posner, MD;  Location: [place];  Service: Vascular;  Laterality: Left;  . Revison of arteriovenous fistula Left 09/26/2015    Procedure: Revision Left Basilic Vein Transposition with Banding using Gortex graft;   Surgeon: Mal Misty, MD;  Location: [place];  Service: Vascular;  Laterality: Left;  . Insertion of dialysis catheter N/A 10/05/2015    Procedure: INSERTION OF DIALYSIS CATHETER Right Internal Jugular.;  Surgeon: Mal Misty, MD;  Location: [place];  Service: Vascular;  Laterality: N/A;  . Peripheral vascular catheterization N/A 10/10/2015    Procedure: Fistulagram;  Surgeon: [name], MD;  Location: [facility];  Service: Cardiovascular;  Laterality: N/A;  . Ligation of arteriovenous  fistula Left 12/21/2015    Procedure: LIGATION OF ARTERIOVENOUS  FISTULA LEFT ARM;  Surgeon: [name], MD;  Location: [place];  Service: Vascular;  Laterality: Left;  . Av  fistula placement Right 12/21/2015    Procedure: ARTERIOVENOUS (AV) FISTULA CREATION RIGHT ARM;  Surgeon: [name], MD;  Location: [place];  Service: Vascular;  Laterality: Right;  . Insertion of dialysis catheter Left 12/21/2015    Procedure: INSERTION OF DIALYSIS CATHETER LEFT INTERNAL JUGULAR VEIN;  Surgeon: [name], MD;  Location: [place];  Service: Vascular;  Laterality: Left;    Social History  Substance Use Topics  . Smoking status: Former Smoker -- 0.50 packs/day for 30 years    Types: Cigarettes    Quit date: 09/22/2014  . Smokeless tobacco: Never Used  . Alcohol Use: No    Family History  Problem Relation Age of Onset  . Hypertension    . Lupus    . Rheum arthritis    . Hypertension Mother   . Diabetes Mother   . Hypertension Sister   . Diabetes Father   . Hypertension Maternal Grandmother   . Colon cancer Neg Hx     Allergies  Allergen Reactions  . Sulfa Antibiotics Itching    Medication list has been reviewed and updated.  Current Outpatient Prescriptions on File Prior to Visit  Medication Sig Dispense Refill  . acetaminophen (TYLENOL) 325 MG tablet Take 2 tablets (650 mg total) by mouth every 4 (four) hours as needed for mild pain.    Marland Kitchen ALPRAZolam (XANAX) 0.25 MG tablet Take 1  tablet (0.25 mg total) by mouth every 8 (eight) hours as needed for anxiety (0.25mg -0.5mg ). 60 tablet 0  . cinacalcet (SENSIPAR) 30 MG tablet Take 30 mg by mouth daily.    Marland Kitchen docusate sodium (COLACE) 50 MG capsule Take 50 mg by mouth 2 (two) times daily. Reported on 12/12/2015    . hydroxychloroquine (PLAQUENIL) 200 MG tablet Take 1 tablet (200 mg total) by mouth 2 (two) times daily. 60 tablet 1  . lidocaine-prilocaine (EMLA) cream Apply 1 application topically as needed.    . multivitamin (RENA-VIT) TABS tablet Take 1 tablet by mouth daily.    . pantoprazole (PROTONIX) 40 MG tablet Take 1 tablet (40 mg total) by mouth daily. 30 tablet 11  . pravastatin (PRAVACHOL) 20 MG tablet Take 20 mg by mouth daily.    . pregabalin (LYRICA) 75 MG capsule Take 1 capsule (75 mg total) by mouth daily. 30 capsule 6  . RENVELA 2.4 G PACK Take 2.4-4.8 g by mouth 3 (three) times daily with meals. 2 PACKS WITH MEALS 3 TIMES DAILY, 1 PACK WITH SNACKS    . temazepam (RESTORIL) 30 MG capsule Take 30 mg by mouth at bedtime as needed for sleep. Reported on 12/12/2015    . thiamine 100 MG tablet Take 1 tablet (100 mg total) by mouth daily. 30 tablet 1   No current facility-administered medications on file prior to visit.    Review of Systems:  As per HPI- otherwise negative.   Physical Examination: Filed Vitals:   01/07/16 1146  BP: 120/78  Pulse: 87  Temp: 98.2 F (36.8 C)   Filed Vitals:   01/07/16 1146  Height: 5\' 5"  (1.651 m)  Weight: 141 lb 6.4 oz (64.139 kg)   Body mass index is 23.53 kg/(m^2). Ideal Body Weight: Weight in (lb) to have BMI = 25: 149.9  GEN: WDWN, NAD, Non-toxic, A & O x 3, well appearing HEENT: Atraumatic, Normocephalic. Neck supple. No masses, No LAD.  Bilateral TM wnl, oropharynx normal.  PEERL,EOMI.   Ears and Nose: No external deformity. CV: RRR, No M/G/R. No JVD. No  thrill. No extra heart sounds. PULM: CTA B, no wheezes, crackles, rhonchi. No retractions. No resp. distress. No  accessory muscle use. ABD: S, NT, ND. EXTR: No c/c/e NEURO Normal gait.  PSYCH: Normally interactive. Conversant. Not depressed or anxious appearing.  Calm demeanor.  Normal foot exam with monofilament- however cannot palpate DP pulses on either foot.   Hands show normal strength and cap refill bilaterally.  She has an active port in the right arm and inactive port in the left   Assessment and Plan: Hospital discharge follow-up - Plan: CBC, Comprehensive metabolic panel  Hereditary and idiopathic peripheral neuropathy - Plan: HYDROcodone-acetaminophen (NORCO/VICODIN) 5-325 MG tablet, pregabalin (LYRICA) 75 MG capsule  Controlled type 2 diabetes mellitus with chronic kidney disease on chronic dialysis, without long-term current use of insulin (HCC) - Plan: Hemoglobin A1c  Chest congestion - Plan: DG Chest 2 View  End stage renal disease on dialysis [facility])  Here today for a hospital follow-up.  She was in pt towards the end of February for abd pain- the cause was never defined.  She needs follow-up labs and CXR today Will also check her A1c She continues to have complaint of extremity pain- this may be due to her DM, but she also has lupus/ RA so another cause is possible.  Will increase her lyrica to 75 BID on dialysis days only, and also did refill of her hydrocodone.  Advised her to please get this medication from ONE provider in the future   Signed Lamar Blinks, MD

## 2016-01-10 ENCOUNTER — Ambulatory Visit: Payer: Managed Care, Other (non HMO) | Admitting: Family Medicine

## 2016-01-16 ENCOUNTER — Encounter: Payer: Self-pay | Admitting: Physician Assistant

## 2016-01-16 ENCOUNTER — Other Ambulatory Visit (INDEPENDENT_AMBULATORY_CARE_PROVIDER_SITE_OTHER): Payer: Managed Care, Other (non HMO)

## 2016-01-16 ENCOUNTER — Ambulatory Visit (INDEPENDENT_AMBULATORY_CARE_PROVIDER_SITE_OTHER): Payer: Managed Care, Other (non HMO) | Admitting: Physician Assistant

## 2016-01-16 VITALS — BP 98/60 | HR 84 | Ht 65.0 in | Wt 138.0 lb

## 2016-01-16 DIAGNOSIS — R938 Abnormal findings on diagnostic imaging of other specified body structures: Secondary | ICD-10-CM

## 2016-01-16 DIAGNOSIS — R9389 Abnormal findings on diagnostic imaging of other specified body structures: Secondary | ICD-10-CM

## 2016-01-16 LAB — HEPATIC FUNCTION PANEL
ALBUMIN: 3.7 g/dL (ref 3.5–5.2)
ALT: 8 U/L (ref 0–35)
AST: 30 U/L (ref 0–37)
Alkaline Phosphatase: 98 U/L (ref 39–117)
Bilirubin, Direct: 0 mg/dL (ref 0.0–0.3)
TOTAL PROTEIN: 8.3 g/dL (ref 6.0–8.3)
Total Bilirubin: 0.4 mg/dL (ref 0.2–1.2)

## 2016-01-16 NOTE — Progress Notes (Signed)
Patient ID: [name] [name], female   DOB: 04-26-1961, 55 y.o.   MRN: [id number]   Subjective:    Patient ID: [name] [name], female    DOB: [birth year], 55 y.o.   MRN: [id number]  HPI  [name] [name] is a pleasant 55 year old African-American female seen today in post hospital follow-up after recent admission with acute abdominal pain. She is known to Dr. Ardis Hughs and had just undergone colonoscopy on 12/12/2015. This was a normal exam. Patient has history of end-stage renal disease, on dialysis, adult-onset diabetes mellitus, prior history of CVA, history of peripheral neuropathy, rheumatoid arthritis, hypertension, congestive heart failure and had a hospitalization in 2016 with ARDS. She was seen by GI during her most recent hospitalization after CT of the abdomen and pelvis showed a dilated left hepatic duct and common bile duct. She had normal liver function study tests. Subsequent MRI/MRCP showed dilation of the left hepatic duct common hepatic duct into a lesser extent the common bile duct. Patient says that her episode of pain came on rather acutely and was across her upper abdomen and into her back. She says she had had a colonoscopy the following day then had dialysis earlier that day and developed a pain in the evening. She went to the emergency room and says the pain was gone by the following day. She has not had any recurrence of symptoms. She says her appetite has been fine should not had any nausea or vomiting. Bowel movements have been normal. Decision had been made to follow-up MRCP in about 3 months and then decide if EUS was indicated.  Review of Systems Pertinent positive and negative review of systems were noted in the above HPI section.  All other review of systems was otherwise negative.  Outpatient Encounter Prescriptions as of 01/16/2016  Medication Sig  . acetaminophen (TYLENOL) 325 MG tablet Take 2 tablets (650 mg total) by mouth every 4 (four) hours as needed for mild pain.  Marland Kitchen  ALPRAZolam (XANAX) 0.25 MG tablet Take 1 tablet (0.25 mg total) by mouth every 8 (eight) hours as needed for anxiety (0.25mg -0.5mg ).  . cinacalcet (SENSIPAR) 30 MG tablet Take 30 mg by mouth daily.  Marland Kitchen docusate sodium (COLACE) 50 MG capsule Take 50 mg by mouth 2 (two) times daily. Reported on 12/12/2015  . HYDROcodone-acetaminophen (NORCO/VICODIN) 5-325 MG tablet Take 1 after dialysis as needed for pain  . hydroxychloroquine (PLAQUENIL) 200 MG tablet Take 1 tablet (200 mg total) by mouth 2 (two) times daily.  Marland Kitchen lidocaine-prilocaine (EMLA) cream Apply 1 application topically as needed.  . multivitamin (RENA-VIT) TABS tablet Take 1 tablet by mouth daily.  . pantoprazole (PROTONIX) 40 MG tablet Take 1 tablet (40 mg total) by mouth daily.  . pravastatin (PRAVACHOL) 20 MG tablet Take 20 mg by mouth daily.  . pregabalin (LYRICA) 75 MG capsule Take 1 capsule (75 mg total) by mouth 2 (two) times daily.  Marland Kitchen RENVELA 2.4 G PACK Take 2.4-4.8 g by mouth 3 (three) times daily with meals. 2 PACKS WITH MEALS 3 TIMES DAILY, 1 PACK WITH SNACKS  . temazepam (RESTORIL) 30 MG capsule Take 30 mg by mouth at bedtime as needed for sleep. Reported on 12/12/2015  . thiamine 100 MG tablet Take 1 tablet (100 mg total) by mouth daily.   No facility-administered encounter medications on file as of 01/16/2016.   Allergies  Allergen Reactions  . Sulfa Antibiotics Itching   Patient Active Problem List   Diagnosis Date Noted  . Dilated bile duct   .  Abnormal CT of the abdomen   . Abdominal pain 12/28/2015  . Epigastric pain 12/28/2015  . End stage renal disease on dialysis ([place]) 12/28/2015  . Lupus ([place]) 12/28/2015  . GERD (gastroesophageal reflux disease) 12/28/2015  . Hyperlipidemia 12/28/2015  . Prolonged Q-T interval on ECG 12/28/2015  . Steal syndrome as complication of dialysis access ([place]) 09/21/2015  . History of CVA (cerebrovascular accident) 02/20/2015  . Debilitated 02/15/2015  . Acute respiratory failure ([place])    . SVT (supraventricular tachycardia) ([place])   . Acute diastolic CHF (congestive heart failure) ([place])   . Encephalopathy acute   . Atelectasis, left   . Confusion   . ARDS (adult respiratory distress syndrome) ([place]) 01/22/2015  . Tachycardia   . Weakness 01/19/2015  . Elevated troponin level 01/19/2015  . Hypotension 01/19/2015  . Shortness of breath 01/18/2015  . Hemorrhoid 12/07/2014  . HCAP (healthcare-associated pneumonia) 11/23/2014  . Fever   . Bleeding gastrointestinal   . Tremor 11/21/2014  . Protein-calorie malnutrition, severe ([place]) 11/20/2014  . Noninfectious gastroenteritis and colitis 11/19/2014  . Acute blood loss anemia   . Hyponatremia   . 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 Secondary cardiomyopathy ([place])   . Noncompliance with medication regimen   . Tobacco abuse   . Sepsis ([place]) 10/10/2014  . Leucocytosis 10/10/2014  . Fluid overload 10/10/2014  . SIRS (systemic inflammatory response syndrome) ([place]) 10/10/2014  . Anemia 10/10/2014  . Hyperkalemia 10/10/2014  . Acute encephalopathy 10/10/2014  . Torticollis, acquired 10/06/2014  . Essential hypertension 10/05/2014  . Acute respiratory acidosis   . ICH (intracerebral hemorrhage) ([place])   . Chronic ankle pain   . Cerebral hemorrhage ([place])   . 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 Hypertensive emergency 09/28/2014  . Iron deficiency anemia 02/18/2008  . Mononeuritis of lower limb 02/18/2008  . History of adrenal insufficiency 02/18/2008  . DIABETES MELLITUS, TYPE II 10/25/2007  . HYPERLIPIDEMIA 10/25/2007  . Systemic lupus erythematosus ([place]) 10/25/2007  . Rheumatoid arthritis ([place]) 10/25/2007  . COLONIC POLYPS, HYPERPLASTIC 04/08/2006   Social History   Social History  . Marital Status: Single    Spouse Name: N/A  . Number of Children: N/A  . Years of Education: N/A   Occupational History  . Not on file.   Social History Main Topics  . Smoking status: Former Smoker -- 0.50  packs/day for 30 years    Types: Cigarettes    Quit date: 09/22/2014  . Smokeless tobacco: Never Used  . Alcohol Use: No  . Drug Use: No  . Sexual Activity: Not on file   Other Topics Concern  . Not on file   Social History Narrative    Ms. Schifano's family history includes Diabetes in her father and mother; Hypertension in her maternal grandmother, mother, and sister. There is no history of Colon cancer.      Objective:    Filed Vitals:   01/16/16 0946  BP: 98/60  Pulse: 84    Physical Exam  well-developed African American female in no acute distress, pleasant blood pressure 98/60 pulse 84 height 5 foot 5 weight 138. HEENT; nontraumatic normocephalic EOMI PERRLA sclera anicteric, Cardiovascular ;regular rate and rhythm with S1-S2 no murmur or gallop, Pulmonary; clear bilaterally, Abdomen ;soft nontender nondistended bowel sounds are active no palpable mass or hepatosplenomegaly Rectal; exam not done, Ext; no clubbing cyanosis or edema skin warm and dry, Neuropsych; mood and affect appropriate       Assessment & Plan:   #  64  55 year old female with end-stage renal disease on dialysis with recent hospitalization with acute upper abdominal pain which lasted for less than 24 hours, resolved and has not recurred. She had normal LFTs but CT scan and MRI/MRCP both showed dilation of the left hepatic duct, common hepatic duct into a lesser extent the common bile duct.  Findings are concerning for distal bile duct obstruction, rule out choledocho cyst. Gallbladder documented as normal #2 adult-onset diabetes mellitus #3 history of CVA/hemorrhage #4 peripheral neuropathy #5 hypertension #6 history of ARDS 2016 #7 congestive heart failure  Plan; check hepatic panel today Will schedule patient for an MRI/MRCP in May 2017,  then decide if EUS/ERCP per Dr. Ardis Hughs is indicated. Plans were discussed with patient and detail and she voices understanding. She was also advised should she  develop any recurrent abdominal pain nausea or vomiting that she would need to be reevaluated  Alfredia Ferguson PA-C 01/16/2016   Cc: Darreld Mclean, MD

## 2016-01-16 NOTE — Progress Notes (Signed)
i agree with the above note, plan 

## 2016-01-16 NOTE — Patient Instructions (Signed)
Please go to the basement level to have your labs drawn.   You will be getting a call from Dr. Eugenia Pancoast nurse, Chong Sicilian . We will be scheduling you for an MRI/MRCP in May.

## 2016-01-17 ENCOUNTER — Telehealth: Payer: Self-pay

## 2016-01-17 NOTE — Telephone Encounter (Signed)
-----   Message from Milus Banister, MD sent at 01/16/2016  4:41 PM EDT ----- Regarding: RE: MRI/MRCP Thanks Amy.  I agree with MRI, MRCP in 2 months, noted that her LFTs were again normal today.    dj  ----- Message -----    From: Barron Alvine, RN    Sent: 01/16/2016   4:28 PM      To: Milus Banister, MD Subject: FW: MRI/MRCP                                     ----- Message -----    From: Alfredia Ferguson, PA-C    Sent: 01/16/2016   4:17 PM      To: Barron Alvine, RN Subject: MRI/MRCP                                       Amerika Nourse, this is a pt of Dr Ardis Hughs- I saw her in office today- she needs to be scheduled for MRI Abd/MRCP  In early May -put under jacobs name.. This is  for - abnormal MRCP 12/2015 -follow up dilation of left hepatic duct, common hepatic duct , and CBD....  thanks

## 2016-01-17 NOTE — Telephone Encounter (Signed)
Staff message to set up MRI/MRCP in 2 months

## 2016-01-18 ENCOUNTER — Telehealth: Payer: Self-pay

## 2016-01-18 NOTE — Telephone Encounter (Signed)
Phone call from [facility].  Reported that the pt. Continue to c/o right arm pain and was asking for a refill on her narcotic pain medication.  Reported that Dr. Jimmy Footman is requesting to have her 6 wk f/u appt. Moved to earlier date due to poss. Steal syndrome.  Advised a scheduler will contact pt. With an appt.

## 2016-01-18 NOTE — Telephone Encounter (Signed)
LM for pt with new appt times/date

## 2016-01-21 ENCOUNTER — Encounter: Payer: Self-pay | Admitting: Surgery

## 2016-01-23 ENCOUNTER — Encounter (HOSPITAL_COMMUNITY): Payer: Managed Care, Other (non HMO)

## 2016-01-28 ENCOUNTER — Encounter: Payer: Self-pay | Admitting: Surgery

## 2016-01-28 ENCOUNTER — Encounter: Payer: Managed Care, Other (non HMO) | Admitting: Surgery

## 2016-01-28 ENCOUNTER — Ambulatory Visit (INDEPENDENT_AMBULATORY_CARE_PROVIDER_SITE_OTHER): Payer: Self-pay | Admitting: Surgery

## 2016-01-28 ENCOUNTER — Other Ambulatory Visit: Payer: Self-pay | Admitting: *Deleted

## 2016-01-28 VITALS — BP 120/80 | HR 88 | Temp 97.6°F | Resp 16 | Ht 65.0 in | Wt 148.0 lb

## 2016-01-28 DIAGNOSIS — Z992 Dependence on renal dialysis: Secondary | ICD-10-CM

## 2016-01-28 DIAGNOSIS — N186 End stage renal disease: Secondary | ICD-10-CM

## 2016-01-28 NOTE — Progress Notes (Signed)
Patient name: [name] [name] MRN: [id number] DOB: Mar 16, 1961 Sex: female     Chief Complaint  Patient presents with  . Chronic Kidney Disease    eval of Right arm AVF,  has left chest TDC placed on 12-21-15 by Dr. Trula Slade  Pt has HD on M,W,F at [facility] [address]    HISTORY OF PRESENT ILLNESS: The patient is back for follow-up.  She originally underwent a staged left basilic vein transposition by Dr. early.  She developed a steal syndrome and therefore Dr. [name] banded the proximal anastomosis.  She continued to have symptoms and therefore I ligated this fistula.  At that time she had a left-sided catheter placed and a right first stage basilic vein transposition was performed.  She complains that after approximately 3 hours of treatment she gets pain and numbness in her hand.  They're very cool.  This causes her to have to terminate her procedures.  She did state that once her dry weight was increased that she has been able to tolerate the last 2 dialysis sessions without significant difficulty.  While not on dialysis, the patient states that she does not have any significant hand problems.  She does have a history of stroke which affected her left arm.  Past Medical History  Diagnosis Date  . Hypertension   . Lupus ([place])   . CKD (chronic kidney disease)     due to lupus/Dr. [name]  . Stenosis of cervical spine region     with HNP at C5/6, C6/7  . Metatarsal bone fracture right    4th  . 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 Arthritis     RA  . Anemia   . Stroke ([place]) 11/2014    left sided weakness, dysphagia  . Pain in joints   . Paresthesias   . Hyperlipidemia   . Colitis     ? per hospital notes 11/2014  . Lower GI bleed   . Hyponatremia   . Metabolic acidosis   . Hyperplastic colon polyp   . Hemorrhoids   . Blood transfusion without reported diagnosis   . Myocardial infarction ([place])   . Diabetes mellitus ([place])    border line    Past Surgical History  Procedure Laterality Date  . Breast biopsy    . Tubal ligation    . Insertion of dialysis catheter Right 02/05/2015    Procedure: INSERTION OF DIALYSIS CATHETER - RIGHT INTERNAL JUGULAR;  Surgeon: Conrad Paradise, MD;  Location: [place];  Service: Vascular;  Laterality: Right;  . Bascilic vein transposition Left 02/14/2015    Procedure: BASILIC VEIN Fistula Creation First Stage;  Surgeon: Rosetta Posner, MD;  Location: [place];  Service: Vascular;  Laterality: Left;  . Colonoscopy w/ biopsies and polypectomy    . Bascilic vein transposition Left 03/28/2015    Procedure: LEFT ARM SECOND STAGE BASCILIC VEIN TRANSPOSITION;  Surgeon: Rosetta Posner, MD;  Location: [place];  Service: Vascular;  Laterality: Left;  . Revison of arteriovenous fistula Left 09/26/2015    Procedure: Revision Left Basilic Vein Transposition with Banding using Gortex graft;  Surgeon: Mal Misty, MD;  Location: [place];  Service: Vascular;  Laterality: Left;  . Insertion of dialysis catheter N/A 10/05/2015    Procedure: INSERTION OF DIALYSIS CATHETER Right Internal Jugular.;  Surgeon: Mal Misty, MD;  Location: [place];  Service: Vascular;  Laterality: N/A;  . Peripheral vascular catheterization N/A 10/10/2015    Procedure: Fistulagram;  Surgeon: [name], MD;  Location: [place];  Service: Cardiovascular;  Laterality: N/A;  . Ligation of arteriovenous  fistula Left 12/21/2015    Procedure: LIGATION OF ARTERIOVENOUS  FISTULA LEFT ARM;  Surgeon: [name], MD;  Location: [place];  Service: Vascular;  Laterality: Left;  . Av fistula placement Right 12/21/2015    Procedure: ARTERIOVENOUS (AV) FISTULA CREATION RIGHT ARM;  Surgeon: [name], MD;  Location: [place];  Service: Vascular;  Laterality: Right;  . Insertion of dialysis catheter Left 12/21/2015    Procedure: INSERTION OF DIALYSIS CATHETER LEFT INTERNAL JUGULAR VEIN;  Surgeon: [name], MD;  Location: [facility];   Service: Vascular;  Laterality: Left;    Social History   Social History  . Marital Status: Single    Spouse Name: N/A  . Number of Children: N/A  . Years of Education: N/A   Occupational History  . Not on file.   Social History Main Topics  . Smoking status: Former Smoker -- 0.50 packs/day for 30 years    Types: Cigarettes    Quit date: 09/22/2014  . Smokeless tobacco: Never Used  . Alcohol Use: No  . Drug Use: No  . Sexual Activity: Not on file   Other Topics Concern  . Not on file   Social History Narrative    Family History  Problem Relation Age of Onset  . Hypertension    . Lupus    . Rheum arthritis    . Hypertension Mother   . Diabetes Mother   . Hypertension Sister   . Diabetes Father   . Hypertension Maternal Grandmother   . Colon cancer Neg Hx     Allergies as of 01/28/2016 - Review Complete 01/28/2016  Allergen Reaction Noted  . Sulfa antibiotics Itching 03/01/2012    Current Outpatient Prescriptions on File Prior to Visit  Medication Sig Dispense Refill  . acetaminophen (TYLENOL) 325 MG tablet Take 2 tablets (650 mg total) by mouth every 4 (four) hours as needed for mild pain.    Marland Kitchen ALPRAZolam (XANAX) 0.25 MG tablet Take 1 tablet (0.25 mg total) by mouth every 8 (eight) hours as needed for anxiety (0.25mg -0.5mg ). 60 tablet 0  . cinacalcet (SENSIPAR) 30 MG tablet Take 30 mg by mouth daily.    Marland Kitchen docusate sodium (COLACE) 50 MG capsule Take 50 mg by mouth 2 (two) times daily. Reported on 12/12/2015    . HYDROcodone-acetaminophen (NORCO/VICODIN) 5-325 MG tablet Take 1 after dialysis as needed for pain 30 tablet 0  . hydroxychloroquine (PLAQUENIL) 200 MG tablet Take 1 tablet (200 mg total) by mouth 2 (two) times daily. 60 tablet 1  . lidocaine-prilocaine (EMLA) cream Apply 1 application topically as needed.    . multivitamin (RENA-VIT) TABS tablet Take 1 tablet by mouth daily.    . pantoprazole (PROTONIX) 40 MG tablet Take 1 tablet (40 mg total) by mouth  daily. 30 tablet 11  . pravastatin (PRAVACHOL) 20 MG tablet Take 20 mg by mouth daily.    . pregabalin (LYRICA) 75 MG capsule Take 1 capsule (75 mg total) by mouth 2 (two) times daily. 60 capsule 5  . RENVELA 2.4 G PACK Take 2.4-4.8 g by mouth 3 (three) times daily with meals. 2 PACKS WITH MEALS 3 TIMES DAILY, 1 PACK WITH SNACKS    . temazepam (RESTORIL) 30 MG capsule Take 30 mg by mouth at  bedtime as needed for sleep. Reported on 12/12/2015    . thiamine 100 MG tablet Take 1 tablet (100 mg total) by mouth daily. 30 tablet 1   No current facility-administered medications on file prior to visit.       PHYSICAL EXAMINATION:   Vital signs are  Filed Vitals:   01/28/16 1032  BP: 120/80  Pulse: 88  Temp: 97.6 F (36.4 C)  TempSrc: Oral  Resp: 16  Height: 5\' 5"  (1.651 m)  Weight: 148 lb (67.132 kg)  SpO2: 97%   Body mass index is 24.63 kg/(m^2). General: The patient appears their stated age. HEENT:  No gross abnormalities Pulmonary:  Non labored breathing Skin: There are no ulcer or rashes noted. Psychiatric: The patient has normal affect. Cardiovascular: Difficult to palpate a radial pulse bilateral   Diagnostic Studies None  Assessment: End-stage renal disease Plan: I discussed several options with the patient.  However, since she has done well with the last 2 dialysis sessions, she would like to continue to monitor this situation have her scheduled to come back in a month for a fistula duplex on the right to see if she can proceed with a second stage basilic vein transposition.  Because I had difficulty palpating radial pulses bilaterally, if the patient continues to have symptoms, she would benefit from bilateral upper extremity angiography.  Have her follow-up scheduled for 1 month  V. Leia Alf, M.D. [facility] Office: [phone] Pager:  [phone]

## 2016-02-04 ENCOUNTER — Encounter (HOSPITAL_COMMUNITY): Payer: Managed Care, Other (non HMO)

## 2016-02-04 ENCOUNTER — Encounter: Payer: Managed Care, Other (non HMO) | Admitting: Surgery

## 2016-02-13 ENCOUNTER — Encounter: Payer: Self-pay | Admitting: Surgery

## 2016-02-14 ENCOUNTER — Ambulatory Visit (HOSPITAL_COMMUNITY): Payer: Managed Care, Other (non HMO) | Attending: Surgery

## 2016-02-19 ENCOUNTER — Ambulatory Visit (HOSPITAL_COMMUNITY)
Admission: RE | Admit: 2016-02-19 | Discharge: 2016-02-19 | Disposition: A | Discharge status: Home / Self Care | Payer: Managed Care, Other (non HMO) | Source: Ambulatory Visit | Attending: Surgery | Admitting: Surgery

## 2016-02-19 DIAGNOSIS — I120 Hypertensive chronic kidney disease with stage 5 chronic kidney disease or end stage renal disease: Secondary | ICD-10-CM | POA: Diagnosis not present

## 2016-02-19 DIAGNOSIS — E1122 Type 2 diabetes mellitus with diabetic chronic kidney disease: Secondary | ICD-10-CM | POA: Insufficient documentation

## 2016-02-19 DIAGNOSIS — N186 End stage renal disease: Secondary | ICD-10-CM | POA: Insufficient documentation

## 2016-02-19 DIAGNOSIS — I70208 Unspecified atherosclerosis of native arteries of extremities, other extremity: Secondary | ICD-10-CM | POA: Diagnosis not present

## 2016-02-19 DIAGNOSIS — E785 Hyperlipidemia, unspecified: Secondary | ICD-10-CM | POA: Diagnosis not present

## 2016-02-25 ENCOUNTER — Other Ambulatory Visit: Payer: Self-pay

## 2016-02-25 ENCOUNTER — Ambulatory Visit (INDEPENDENT_AMBULATORY_CARE_PROVIDER_SITE_OTHER): Payer: Managed Care, Other (non HMO) | Admitting: Surgery

## 2016-02-25 ENCOUNTER — Ambulatory Visit: Payer: Self-pay | Admitting: Surgery

## 2016-02-25 ENCOUNTER — Encounter: Payer: Self-pay | Admitting: Surgery

## 2016-02-25 VITALS — BP 148/83 | HR 81 | Ht 65.0 in | Wt 149.0 lb

## 2016-02-25 DIAGNOSIS — Z992 Dependence on renal dialysis: Secondary | ICD-10-CM

## 2016-02-25 DIAGNOSIS — N186 End stage renal disease: Secondary | ICD-10-CM

## 2016-02-25 NOTE — Progress Notes (Signed)
Patient name: [name] [name] MRN: [id number] DOB: [birth year] Sex: female  REASON FOR VISIT: follow up  HPI: [name] is a 55 y.o. female who originally underwent a staged left basilic vein transposition by Dr. Donnetta Hutching. She developed a steal syndrome and therefore Dr. [name] banded the proximal anastomosis. She continued to have symptoms and therefore I ligated this fistula. At that time she had a left-sided catheter placed and a right first stage basilic vein transposition was performed. She complained that after approximately 3 hours of treatment she gets pain and numbness in her hand. They're very cool. This causes her to have to terminate her procedures. She did state that once her dry weight was increased, she has been able to tolerate  dialysis sessions without significant difficulty.  While not on dialysis, the patient states that she does not have any significant hand problems. She does have a history of stroke which affected her left arm.  She has residual numbness in her left arm.  Current Outpatient Prescriptions  Medication Sig Dispense Refill  . acetaminophen (TYLENOL) 325 MG tablet Take 2 tablets (650 mg total) by mouth every 4 (four) hours as needed for mild pain.    [name] [name] ALPRAZolam (XANAX) 0.25 MG tablet Take 1 tablet (0.25 mg total) by mouth every 8 (eight) hours as needed for anxiety (0.25mg -0.5mg ). 60 tablet 0  . cinacalcet (SENSIPAR) 30 MG tablet Take 30 mg by mouth daily.    [name] [name] docusate sodium (COLACE) 50 MG capsule Take 50 mg by mouth 2 (two) times daily. Reported on 12/12/2015    . HYDROcodone-acetaminophen (NORCO/VICODIN) 5-325 MG tablet Take 1 after dialysis as needed for pain 30 tablet 0  . hydroxychloroquine (PLAQUENIL) 200 MG tablet Take 1 tablet (200 mg total) by mouth 2 (two) times daily. 60 tablet 1  . lidocaine-prilocaine (EMLA) cream Apply 1 application topically as needed.    . multivitamin (RENA-VIT) TABS tablet Take 1 tablet by mouth daily.    .  pantoprazole (PROTONIX) 40 MG tablet Take 1 tablet (40 mg total) by mouth daily. 30 tablet 11  . pravastatin (PRAVACHOL) 20 MG tablet Take 20 mg by mouth daily.    . pregabalin (LYRICA) 75 MG capsule Take 1 capsule (75 mg total) by mouth 2 (two) times daily. 60 capsule 5  . RENVELA 2.4 G PACK Take 2.4-4.8 g by mouth 3 (three) times daily with meals. 2 PACKS WITH MEALS 3 TIMES DAILY, 1 PACK WITH SNACKS    . temazepam (RESTORIL) 30 MG capsule Take 30 mg by mouth at bedtime as needed for sleep. Reported on 12/12/2015    . thiamine 100 MG tablet Take 1 tablet (100 mg total) by mouth daily. 30 tablet 1   No current facility-administered medications for this visit.    REVIEW OF SYSTEMS:  [X]  denotes positive finding, [ ]  denotes negative finding Cardiac  Comments:  Chest pain or chest pressure:    Shortness of breath upon exertion:    Short of breath when lying flat:    Irregular heart rhythm:    Constitutional    Fever or chills:      PHYSICAL EXAM: Filed Vitals:   02/25/16 0844 02/25/16 0847  BP: 144/81 148/83  Pulse: 81   Height: 5\' 5"  (1.651 m)   Weight: 149 lb (67.586 kg)   SpO2: 94%     GENERAL: The patient is a well-nourished female, in no acute distress. The vital signs are documented above. CARDIOVASCULAR: There is a regular rate  and rhythm. PULMONARY: There is good air exchange bilaterally without wheezing or rales. Palpable thrill within fistula on the right.  Radial pulse is not palpable bilateral  Data: I have ordered a fistula duplex.  There appears to be a stenosis within the brachial artery, proximal to the arterial venous anastomosis.  In addition, there appears to be a short segment narrowing within the basilic vein just beyond the anastomosis.  MEDICAL ISSUES: I discussed with the patient that I would recommend proceeding with angiography to better define her anatomy.  Specifically, I want to focus on the potential narrowing within the brachial artery on the right  just proximal to the arteriovenous anastomosis, and secondly to evaluate the proximal portion of the basilic vein transposition to see what will need to be done in the operating room to get her a good working fistula and to potentially alleviate symptoms of steal.  Also, because she has persistent symptoms of left arm, I will get a left upper extremity angiogram to see if there is any obvious source for her discomfort.  If something is amenable to percutaneous intervention I would consider it at that time.  This is been scheduled for Tuesday, May 16.  The patient is going to the beach, that is what is not done sooner.  [name] [facility] [employer]: [phone]

## 2016-03-18 ENCOUNTER — Ambulatory Visit (HOSPITAL_COMMUNITY)
Admission: RE | Admit: 2016-03-18 | Discharge: 2016-03-18 | Disposition: A | Discharge status: Home / Self Care | Payer: Managed Care, Other (non HMO) | Source: Ambulatory Visit | Attending: Surgery | Admitting: Surgery

## 2016-03-18 ENCOUNTER — Telehealth: Payer: Self-pay

## 2016-03-18 ENCOUNTER — Encounter (HOSPITAL_COMMUNITY)
Admission: RE | Disposition: A | Discharge status: Home / Self Care | Payer: Self-pay | Source: Ambulatory Visit | Attending: Surgery

## 2016-03-18 DIAGNOSIS — G458 Other transient cerebral ischemic attacks and related syndromes: Secondary | ICD-10-CM | POA: Insufficient documentation

## 2016-03-18 DIAGNOSIS — I721 Aneurysm of artery of upper extremity: Secondary | ICD-10-CM | POA: Diagnosis not present

## 2016-03-18 DIAGNOSIS — N186 End stage renal disease: Secondary | ICD-10-CM | POA: Insufficient documentation

## 2016-03-18 HISTORY — PX: PERIPHERAL VASCULAR CATHETERIZATION: SHX172C

## 2016-03-18 LAB — GLUCOSE, CAPILLARY: Glucose-Capillary: 72 mg/dL (ref 65–99)

## 2016-03-18 LAB — POCT I-STAT, CHEM 8
BUN: 38 mg/dL — AB (ref 6–20)
CREATININE: 7 mg/dL — AB (ref 0.44–1.00)
Calcium, Ion: 1.12 mmol/L (ref 1.12–1.23)
Chloride: 101 mmol/L (ref 101–111)
Glucose, Bld: 76 mg/dL (ref 65–99)
HEMATOCRIT: 45 % (ref 36.0–46.0)
HEMOGLOBIN: 15.3 g/dL — AB (ref 12.0–15.0)
POTASSIUM: 4.9 mmol/L (ref 3.5–5.1)
Sodium: 139 mmol/L (ref 135–145)
TCO2: 30 mmol/L (ref 0–100)

## 2016-03-18 LAB — POCT ACTIVATED CLOTTING TIME
Activated Clotting Time: 250 seconds
Activated Clotting Time: 224 seconds
Activated Clotting Time: 193 seconds

## 2016-03-18 SURGERY — AORTIC ARCH ANGIOGRAPHY

## 2016-03-18 MED ORDER — MIDAZOLAM HCL 2 MG/2ML IJ SOLN
INTRAMUSCULAR | Status: DC | PRN
  Administered 2016-03-18: 2 mg via INTRAVENOUS

## 2016-03-18 MED ORDER — OXYCODONE HCL 5 MG PO TABS: 5.0000 mg | ORAL_TABLET | ORAL | Status: DC | PRN

## 2016-03-18 MED ORDER — IOPAMIDOL (ISOVUE-370) INJECTION 76%
INTRAVENOUS | Status: AC
  Filled 2016-03-18: qty 50

## 2016-03-18 MED ORDER — HEPARIN SODIUM (PORCINE) 1000 UNIT/ML IJ SOLN
INTRAMUSCULAR | Status: DC | PRN
  Administered 2016-03-18: 6000 [IU] via INTRAVENOUS

## 2016-03-18 MED ORDER — HEPARIN SODIUM (PORCINE) 1000 UNIT/ML IJ SOLN
INTRAMUSCULAR | Status: AC
  Filled 2016-03-18: qty 1

## 2016-03-18 MED ORDER — LIDOCAINE HCL (PF) 1 % IJ SOLN
INTRAMUSCULAR | Status: AC
  Filled 2016-03-18: qty 30

## 2016-03-18 MED ORDER — ACETAMINOPHEN 325 MG RE SUPP: 325.0000 mg | RECTAL | Status: DC | PRN

## 2016-03-18 MED ORDER — ACETAMINOPHEN 325 MG PO TABS: 325.0000 mg | ORAL_TABLET | ORAL | Status: DC | PRN

## 2016-03-18 MED ORDER — MORPHINE SULFATE (PF) 10 MG/ML IV SOLN
2.0000 mg | INTRAVENOUS | Status: DC | PRN
  Administered 2016-03-18: 2 mg via INTRAVENOUS

## 2016-03-18 MED ORDER — IODIXANOL 320 MG/ML IV SOLN
INTRAVENOUS | Status: DC | PRN
  Administered 2016-03-18: 160 mL via INTRA_ARTERIAL

## 2016-03-18 MED ORDER — FENTANYL CITRATE (PF) 100 MCG/2ML IJ SOLN
INTRAMUSCULAR | Status: AC
  Filled 2016-03-18: qty 2

## 2016-03-18 MED ORDER — LABETALOL HCL 5 MG/ML IV SOLN: 10.0000 mg | INTRAVENOUS | Status: DC | PRN

## 2016-03-18 MED ORDER — MORPHINE SULFATE (PF) 2 MG/ML IV SOLN
INTRAVENOUS | Status: AC
  Filled 2016-03-18: qty 1

## 2016-03-18 MED ORDER — HEPARIN (PORCINE) IN NACL 2-0.9 UNIT/ML-% IJ SOLN
INTRAMUSCULAR | Status: AC
  Filled 2016-03-18: qty 1000

## 2016-03-18 MED ORDER — HEPARIN (PORCINE) IN NACL 2-0.9 UNIT/ML-% IJ SOLN
INTRAMUSCULAR | Status: DC | PRN
  Administered 2016-03-18: 1000 mL via INTRA_ARTERIAL

## 2016-03-18 MED ORDER — MIDAZOLAM HCL 2 MG/2ML IJ SOLN
INTRAMUSCULAR | Status: AC
  Filled 2016-03-18: qty 2

## 2016-03-18 MED ORDER — LIDOCAINE HCL (PF) 1 % IJ SOLN
INTRAMUSCULAR | Status: DC | PRN
  Administered 2016-03-18: 12 mL

## 2016-03-18 MED ORDER — SODIUM CHLORIDE 0.9% FLUSH: 3.0000 mL | INTRAVENOUS | Status: DC | PRN

## 2016-03-18 MED ORDER — ALUM & MAG HYDROXIDE-SIMETH 200-200-20 MG/5ML PO SUSP: 15.0000 mL | ORAL | Status: DC | PRN

## 2016-03-18 MED ORDER — HYDRALAZINE HCL 20 MG/ML IJ SOLN
5.0000 mg | INTRAMUSCULAR | Status: DC | PRN
Start: 2016-03-18 — End: 2016-03-18

## 2016-03-18 MED ORDER — GUAIFENESIN-DM 100-10 MG/5ML PO SYRP
15.0000 mL | ORAL_SOLUTION | ORAL | Status: DC | PRN
Start: 2016-03-18 — End: 2016-03-18

## 2016-03-18 MED ORDER — METOPROLOL TARTRATE 5 MG/5ML IV SOLN: 2.0000 mg | INTRAVENOUS | Status: DC | PRN

## 2016-03-18 MED ORDER — DOCUSATE SODIUM 100 MG PO CAPS: 100.0000 mg | ORAL_CAPSULE | Freq: Every day | ORAL | Status: DC

## 2016-03-18 MED ORDER — FENTANYL CITRATE (PF) 100 MCG/2ML IJ SOLN
INTRAMUSCULAR | Status: DC | PRN
  Administered 2016-03-18: 50 ug via INTRAVENOUS

## 2016-03-18 MED ORDER — ONDANSETRON HCL 4 MG/2ML IJ SOLN: 4.0000 mg | Freq: Four times a day (QID) | INTRAMUSCULAR | Status: DC | PRN

## 2016-03-18 MED ORDER — PHENOL 1.4 % MT LIQD
1.0000 | OROMUCOSAL | Status: DC | PRN
Start: 2016-03-18 — End: 2016-03-18

## 2016-03-18 SURGICAL SUPPLY — 24 items
BALLN MUSTANG 6X20X135 (BALLOONS) ×3
BALLOON MUSTANG 6X20X135 (BALLOONS) IMPLANT
CATH ANGIO 5F BER2 100CM (CATHETERS) ×1 IMPLANT
CATH ANGIO 5F PIGTAIL 100CM (CATHETERS) ×1 IMPLANT
CATH ANGIO 5F SIM1 100CM (CATHETERS) ×1 IMPLANT
CATH INFINITI VERT 5FR 125CM (CATHETERS) ×1 IMPLANT
COVER PRB 48X5XTLSCP FOLD TPE (BAG) IMPLANT
COVER PROBE 5X48 (BAG) ×3
DEVICE CONTINUOUS FLUSH (MISCELLANEOUS) ×1 IMPLANT
DEVICE TORQUE H2O (MISCELLANEOUS) ×1 IMPLANT
DRAPE ZERO GRAVITY STERILE (DRAPES) ×1 IMPLANT
GUIDEWIRE ANGLED .035X150CM (WIRE) ×1 IMPLANT
KIT ENCORE 26 ADVANTAGE (KITS) ×1 IMPLANT
KIT MICROINTRODUCER STIFF 5F (SHEATH) ×1 IMPLANT
KIT PV (KITS) ×3 IMPLANT
SHEATH PINNACLE 5F 10CM (SHEATH) ×1 IMPLANT
SHEATH PINNACLE 6F 10CM (SHEATH) ×1 IMPLANT
SHEATH SHUTTLE SELECT 6F (SHEATH) ×1 IMPLANT
SHIELD RADPAD SCOOP 12X17 (MISCELLANEOUS) ×1 IMPLANT
SYR MEDRAD MARK V 150ML (SYRINGE) ×3 IMPLANT
TRANSDUCER W/STOPCOCK (MISCELLANEOUS) ×3 IMPLANT
TRAY PV CATH (CUSTOM PROCEDURE TRAY) ×3 IMPLANT
WIRE BENTSON .035X145CM (WIRE) ×1 IMPLANT
WIRE HI TORQ VERSACORE J 260CM (WIRE) ×1 IMPLANT

## 2016-03-18 NOTE — H&P (View-Only) (Signed)
Patient name: [name] [name] MRN: [id number] DOB: [birth year] Sex: female  REASON FOR VISIT: follow up  HPI: [name] [name] is a 55 y.o. female who originally underwent a staged left basilic vein transposition by Dr. Donnetta Hutching. She developed a steal syndrome and therefore Dr. [name] banded the proximal anastomosis. She continued to have symptoms and therefore I ligated this fistula. At that time she had a left-sided catheter placed and a right first stage basilic vein transposition was performed. She complained that after approximately 3 hours of treatment she gets pain and numbness in her hand. They're very cool. This causes her to have to terminate her procedures. She did state that once her dry weight was increased, she has been able to tolerate  dialysis sessions without significant difficulty.  While not on dialysis, the patient states that she does not have any significant hand problems. She does have a history of stroke which affected her left arm.  She has residual numbness in her left arm.  Current Outpatient Prescriptions  Medication Sig Dispense Refill  . acetaminophen (TYLENOL) 325 MG tablet Take 2 tablets (650 mg total) by mouth every 4 (four) hours as needed for mild pain.    Marland Kitchen ALPRAZolam (XANAX) 0.25 MG tablet Take 1 tablet (0.25 mg total) by mouth every 8 (eight) hours as needed for anxiety (0.25mg -0.5mg ). 60 tablet 0  . cinacalcet (SENSIPAR) 30 MG tablet Take 30 mg by mouth daily.    Marland Kitchen docusate sodium (COLACE) 50 MG capsule Take 50 mg by mouth 2 (two) times daily. Reported on 12/12/2015    . HYDROcodone-acetaminophen (NORCO/VICODIN) 5-325 MG tablet Take 1 after dialysis as needed for pain 30 tablet 0  . hydroxychloroquine (PLAQUENIL) 200 MG tablet Take 1 tablet (200 mg total) by mouth 2 (two) times daily. 60 tablet 1  . lidocaine-prilocaine (EMLA) cream Apply 1 application topically as needed.    . multivitamin (RENA-VIT) TABS tablet Take 1 tablet by mouth daily.    .  pantoprazole (PROTONIX) 40 MG tablet Take 1 tablet (40 mg total) by mouth daily. 30 tablet 11  . pravastatin (PRAVACHOL) 20 MG tablet Take 20 mg by mouth daily.    . pregabalin (LYRICA) 75 MG capsule Take 1 capsule (75 mg total) by mouth 2 (two) times daily. 60 capsule 5  . RENVELA 2.4 G PACK Take 2.4-4.8 g by mouth 3 (three) times daily with meals. 2 PACKS WITH MEALS 3 TIMES DAILY, 1 PACK WITH SNACKS    . temazepam (RESTORIL) 30 MG capsule Take 30 mg by mouth at bedtime as needed for sleep. Reported on 12/12/2015    . thiamine 100 MG tablet Take 1 tablet (100 mg total) by mouth daily. 30 tablet 1   No current facility-administered medications for this visit.    REVIEW OF SYSTEMS:  [X]  denotes positive finding, [ ]  denotes negative finding Cardiac  Comments:  Chest pain or chest pressure:    Shortness of breath upon exertion:    Short of breath when lying flat:    Irregular heart rhythm:    Constitutional    Fever or chills:      PHYSICAL EXAM: Filed Vitals:   02/25/16 0844 02/25/16 0847  BP: 144/81 148/83  Pulse: 81   Height: 5\' 5"  (1.651 m)   Weight: 149 lb (67.586 kg)   SpO2: 94%     GENERAL: The patient is a well-nourished female, in no acute distress. The vital signs are documented above. CARDIOVASCULAR: There is a regular rate  and rhythm. PULMONARY: There is good air exchange bilaterally without wheezing or rales. Palpable thrill within fistula on the right.  Radial pulse is not palpable bilateral  Data: I have ordered a fistula duplex.  There appears to be a stenosis within the brachial artery, proximal to the arterial venous anastomosis.  In addition, there appears to be a short segment narrowing within the basilic vein just beyond the anastomosis.  MEDICAL ISSUES: I discussed with the patient that I would recommend proceeding with angiography to better define her anatomy.  Specifically, I want to focus on the potential narrowing within the brachial artery on the right  just proximal to the arteriovenous anastomosis, and secondly to evaluate the proximal portion of the basilic vein transposition to see what will need to be done in the operating room to get her a good working fistula and to potentially alleviate symptoms of steal.  Also, because she has persistent symptoms of left arm, I will get a left upper extremity angiogram to see if there is any obvious source for her discomfort.  If something is amenable to percutaneous intervention I would consider it at that time.  This is been scheduled for Tuesday, May 16.  The patient is going to the beach, that is what is not done sooner.  [name] [facility] [employer]: [phone]

## 2016-03-18 NOTE — Telephone Encounter (Signed)
Left message on machine to call back  

## 2016-03-18 NOTE — Telephone Encounter (Signed)
-----   Message from Barron Alvine, RN sent at 01/17/2016 10:46 AM EDT ----- MRI, MRCP in 2 months, noted that her LFTs

## 2016-03-18 NOTE — Progress Notes (Signed)
Site area: Right  Groin a 6 french arterial sheath was removed.  Site Prior to Removal:  Level 3  Pressure Applied For 40 MINUTES    Bedrest Beginning at 1210p  Manual:   Yes.    Patient Status During Pull:  stable  Post Pull Groin Site:  Level 1 Noted brusing and soft to touch.  Post Pull Instructions Given:  Yes.    Post Pull Pulses Present:  Yes.    Dressing Applied:  Yes.    Comments:  VS remain stable during sheath pull.  Pressure dressing applied. Dr Trula Slade in to look at right groin and talk to pt.

## 2016-03-18 NOTE — Interval H&P Note (Signed)
History and Physical Interval Note:  03/18/2016 7:46 AM  [name] [name]  has presented today for surgery, with the diagnosis of steal syndrome  The various methods of treatment have been discussed with the patient and family. After consideration of risks, benefits and other options for treatment, the patient has consented to  Procedure(s): Aortic Arch Angiography (N/A) as a surgical intervention .  The patient's history has been reviewed, patient examined, no change in status, stable for surgery.  I have reviewed the patient's chart and labs.  Questions were answered to the patient's satisfaction.     [name]

## 2016-03-18 NOTE — Discharge Instructions (Signed)

## 2016-03-18 NOTE — Op Note (Addendum)
Patient name: [name] [name] MRN: [id number] DOB: July 02, 1961 Sex: female  03/18/2016 Pre-operative Diagnosis: ESRD, Left hand pain Post-operative diagnosis:  Same Surgeon:  [name] Procedure Performed:  1.  Ultrasound-guided access, right femoral artery  2.  Aortic arch angiogram  3.  Third order catheterization (right brachial artery)  4.  Right upper extremity runoff  5.  Left upper extremity runoff  6.  Angioplasty, left subclavian artery  7.  Conscious sedation [phone])     Indications:  The patient has a history of a left arm fistula.  She developed a steal syndrome and had to have this ligated.  She continues to have left hand numbness.  She then had a right arm fistula placed.  Her duplex identified a possible stenosis just proximal to the anastomosis within the brachial artery as well as narrowing within the basilic vein just beyond the anastomosis.  She is here for further evaluation  Procedure:  The patient was identified in the holding area and taken to room 8.  The patient was then placed supine on the table and prepped and draped in the usual sterile fashion.  A time out was called.  Conscious sedation was performed with the use of IV fentanyl and Versed under continuous monitoring by myself and circulating nurse.  Heart rate, respirations, oxygen saturations and blood pressure were monitored.  I was present for the entire sedation.  Ultrasound was used to evaluate the right common femoral artery.  It was patent .  A digital ultrasound image was acquired.  A micropuncture needle was used to access the right common femoral artery under ultrasound guidance.  An 018 wire was advanced without resistance and a micropuncture sheath was placed.  The 018 wire was removed and a benson wire was placed.  The micropuncture sheath was exchanged for a 5 french sheath.  A pigtail catheter was advanced into the aortic arch and an aortic arch Joetta Manners was performed.  Next, a Berenstein  2 wire was used to select the right subclavian artery.  The catheter was advanced down into the right axillary artery and right upper extremity Angie Phillip Heal was performed.  I then used a vertebral catheter and placed it in the brachial artery at the level of the antecubital crease to further evaluate blood flow to the hand.  Next, a Berenstein 2 catheter was used to select the left subclavian artery and left upper extremity runoff was performed  Findings:   Aortic arch:  A type I aortic arch is identified.  No significant ostial great vessel stenosis is identified  Right upper Extremity:  The right innominate subclavian, axillary, brachial, radial and ulnar arteries were all widely patent.  Within the fistula, the proximal 2 cm of the fistula are somewhat narrowed down however the basilic vein remained widely patent and of excellent caliber.   Left upper Extremity:   There is a 80% stenosis identified within the subclavian artery just beyond the origin of the vertebral artery and inferior mesenteric artery.  The brachial radial and ulnar arteries are widely patent  Intervention:  After the above images were acquired the decision was made to proceed with intervention.  A 6 French sheath was advanced into the origin of the left subclavian artery.  The patient was fully heparinized.  A 035 Wholey wire was advanced across the stenosis.  The lesion was treated with primary balloon angioplasty using a 6 x 20 stenting balloon.  This was taken to low profile pressure of  4 atm and held for 2 minutes.  Follow-up angiogram revealed resolution of the stenosis with no evidence of dissection.  Catheters and wires were removed.  A short 6 French sheath was placed.  The patient be taken the holding area for sheath pull once her coagulation profile corrects  Impression:  #1  no stenosis is identified within the right upper extremity  #2  luminal narrowing within the proximal portion of the basilic vein just beyond the  arterial venous anastomosis.  This has not compromised the maturity of the basilic vein.  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  This was successfully treated with primary balloon angioplasty using a 6 x 20 balloon at low pressure.  Residual stenosis was less than 5%   V. [name], M.D. [facility] [place] Office: [phone] Pager:  [phone]

## 2016-03-19 ENCOUNTER — Encounter (HOSPITAL_COMMUNITY): Payer: Self-pay | Admitting: Surgery

## 2016-03-19 MED FILL — Morphine Sulfate IV Soln PF 2 MG/ML: INTRAVENOUS | Qty: 1 | Status: AC

## 2016-03-20 ENCOUNTER — Other Ambulatory Visit: Payer: Self-pay

## 2016-03-20 NOTE — Telephone Encounter (Signed)
Left message on machine to call back  

## 2016-03-21 NOTE — Telephone Encounter (Signed)
Unable to reach pt letter mailed 

## 2016-04-16 ENCOUNTER — Encounter (HOSPITAL_COMMUNITY): Payer: Self-pay | Admitting: *Deleted

## 2016-04-16 MED ORDER — SODIUM CHLORIDE 0.9 % IV SOLN
INTRAVENOUS | Status: DC
  Administered 2016-04-17: 07:00:00 via INTRAVENOUS

## 2016-04-16 MED ORDER — DEXTROSE 5 % IV SOLN
1.5000 g | INTRAVENOUS | Status: AC
  Administered 2016-04-17: 1.5 g via INTRAVENOUS
  Filled 2016-04-16: qty 1.5

## 2016-04-16 NOTE — Progress Notes (Signed)
Anesthesia Chart Review: SAME DAY WORK-UP.  Patient is a 55 year old female posted for second stage left basilic vein transposition on 04/17/16 by Dr. Trula Slade.  She is s/p ligation of LUE AVF (due to steal) with first stage left BVT and insertion of left IJ tunneled dialysis catheter on 12/21/15. She has had multiple other HD access procedures including insertion of right IJ catheter 10/05/15 and 02/05/15, LUE first stage BVT 02/14/15 with second stage 03/28/15 with revision 09/26/15. She is s/p angioplasty of left SCA on 03/18/16.  History includes ESRD (HD TTS), former smoker, lupus, HTN, anemia, CVA 11/2014 (left sided weakness, dysphagia), HLD, MI (peak troponin 0.74 during 01/2015 admission for acute respiratory and renal failure, SVT), DM2 (borderline). PCP is listed as Dr. Janett Billow Copland. She denied being followed by a cardiologist. She had a stress echo in 07/2015, which I think was part of her Renal Transplant evaluation ([facility]).  Meds include Xanax, Sensipar, Norco, Plaquenil, Protonix, pravastatin, Lyrica, Revnela, temazepam, thiamine.  12/28/15 EKG: ST 105 bpm, biatrial enlargement, LAD, anterior infarct (old). Rate has decreased and inferolateral T wave abnormality has improved when compared to 01/21/15 tracing.  08/01/15 Stress echo: Study Conclusions - Stress ECG conclusions: There were no stress arrhythmias or conduction abnormalities. The stress ECG was negative for ischemia. - Staged echo: There was no echocardiographic evidence for stress-induced ischemia. Impressions: - Normal dobutamine echo. No evidence of ischemia.  01/21/15 Echo (done during admission for acute respiratory failure, acute on chronic renal failure, SVT, acute diastolic CHF, acute encephalopathy): Study Conclusions - Left ventricle: Wall thickness was increased in a pattern of mild LVH. Systolic function was mildly reduced. The estimated ejection fraction was in the range of 45% to 50%. There is  severe hypokinesis of the mid-apicalanteroseptal myocardium. - Aortic valve: There was moderate regurgitation. - Mitral valve: There was moderate regurgitation. Impressions: - Patient very tachycardic 138bpm.  10/09/15 Carotid doppler: Evidence of 60-79% stenosis of the bilateral internal carotid arteries, right worse than left. Evidence of greater than 50% stenosis of the bilateral external carotid arteries. Bilateral vertebral arteries antegrade. Left subclavian artery appears stenotic.  01/07/16 CXR: IMPRESSION: Right basilar atelectasis.  She is for labs and further anesthesiologist evaluation on arrival. Definitive anesthesia plan at that time.  George Hugh [facility] Short Stay Center/Anesthesiology Phone [phone] 04/16/2016 12:09 PM

## 2016-04-16 NOTE — Progress Notes (Signed)
Pt denies SOB, chest pain, and being under the care of a cardiologist. Pt stated that she does not check her blood sugar. Pt verbalized understanding of all pre-op instructions.

## 2016-04-17 ENCOUNTER — Encounter (HOSPITAL_COMMUNITY): Payer: Self-pay | Admitting: Anesthesiology

## 2016-04-17 ENCOUNTER — Encounter (HOSPITAL_COMMUNITY)
Admission: RE | Disposition: A | Discharge status: Home / Self Care | Payer: Self-pay | Source: Ambulatory Visit | Attending: Surgery

## 2016-04-17 ENCOUNTER — Ambulatory Visit (HOSPITAL_COMMUNITY): Payer: Managed Care, Other (non HMO) | Admitting: Vascular Surgery

## 2016-04-17 ENCOUNTER — Ambulatory Visit (HOSPITAL_COMMUNITY)
Admission: RE | Admit: 2016-04-17 | Discharge: 2016-04-17 | Disposition: A | Discharge status: Home / Self Care | Payer: Managed Care, Other (non HMO) | Source: Ambulatory Visit | Attending: Surgery | Admitting: Surgery

## 2016-04-17 DIAGNOSIS — I252 Old myocardial infarction: Secondary | ICD-10-CM | POA: Insufficient documentation

## 2016-04-17 DIAGNOSIS — Z87891 Personal history of nicotine dependence: Secondary | ICD-10-CM | POA: Diagnosis not present

## 2016-04-17 DIAGNOSIS — M329 Systemic lupus erythematosus, unspecified: Secondary | ICD-10-CM | POA: Diagnosis not present

## 2016-04-17 DIAGNOSIS — I69354 Hemiplegia and hemiparesis following cerebral infarction affecting left non-dominant side: Secondary | ICD-10-CM | POA: Insufficient documentation

## 2016-04-17 DIAGNOSIS — Z992 Dependence on renal dialysis: Secondary | ICD-10-CM | POA: Insufficient documentation

## 2016-04-17 DIAGNOSIS — R7303 Prediabetes: Secondary | ICD-10-CM | POA: Diagnosis not present

## 2016-04-17 DIAGNOSIS — R1310 Dysphagia, unspecified: Secondary | ICD-10-CM | POA: Insufficient documentation

## 2016-04-17 DIAGNOSIS — E785 Hyperlipidemia, unspecified: Secondary | ICD-10-CM | POA: Diagnosis not present

## 2016-04-17 DIAGNOSIS — N185 Chronic kidney disease, stage 5: Secondary | ICD-10-CM | POA: Diagnosis not present

## 2016-04-17 DIAGNOSIS — I69391 Dysphagia following cerebral infarction: Secondary | ICD-10-CM | POA: Diagnosis not present

## 2016-04-17 DIAGNOSIS — Z79899 Other long term (current) drug therapy: Secondary | ICD-10-CM | POA: Diagnosis not present

## 2016-04-17 DIAGNOSIS — I120 Hypertensive chronic kidney disease with stage 5 chronic kidney disease or end stage renal disease: Secondary | ICD-10-CM | POA: Insufficient documentation

## 2016-04-17 DIAGNOSIS — N186 End stage renal disease: Secondary | ICD-10-CM | POA: Diagnosis present

## 2016-04-17 HISTORY — DX: Anxiety disorder, unspecified: F41.9

## 2016-04-17 HISTORY — PX: BASCILIC VEIN TRANSPOSITION: SHX5742

## 2016-04-17 HISTORY — DX: Pneumonia, unspecified organism: J18.9

## 2016-04-17 LAB — POCT I-STAT 4, (NA,K, GLUC, HGB,HCT)
Glucose, Bld: 93 mg/dL (ref 65–99)
HCT: 43 % (ref 36.0–46.0)
HEMOGLOBIN: 14.6 g/dL (ref 12.0–15.0)
Potassium: 4.9 mmol/L (ref 3.5–5.1)
Sodium: 138 mmol/L (ref 135–145)

## 2016-04-17 LAB — GLUCOSE, CAPILLARY
Glucose-Capillary: 99 mg/dL (ref 65–99)
Glucose-Capillary: 80 mg/dL (ref 65–99)

## 2016-04-17 SURGERY — TRANSPOSITION, VEIN, BASILIC
Anesthesia: General | Laterality: Right

## 2016-04-17 MED ORDER — LIDOCAINE HCL (CARDIAC) 20 MG/ML IV SOLN
INTRAVENOUS | Status: DC | PRN
  Administered 2016-04-17: 40 mg via INTRATRACHEAL

## 2016-04-17 MED ORDER — PROTAMINE SULFATE 10 MG/ML IV SOLN
INTRAVENOUS | Status: DC | PRN
  Administered 2016-04-17: 10 mg via INTRAVENOUS
  Administered 2016-04-17: 15 mg via INTRAVENOUS

## 2016-04-17 MED ORDER — FENTANYL CITRATE (PF) 250 MCG/5ML IJ SOLN
INTRAMUSCULAR | Status: AC
  Filled 2016-04-17: qty 5

## 2016-04-17 MED ORDER — ONDANSETRON HCL 4 MG/2ML IJ SOLN
INTRAMUSCULAR | Status: AC
  Filled 2016-04-17: qty 2

## 2016-04-17 MED ORDER — LIDOCAINE HCL (PF) 1 % IJ SOLN
INTRAMUSCULAR | Status: AC
  Filled 2016-04-17: qty 30

## 2016-04-17 MED ORDER — PHENYLEPHRINE HCL 10 MG/ML IJ SOLN
INTRAMUSCULAR | Status: DC | PRN
  Administered 2016-04-17 (×2): 80 ug via INTRAVENOUS

## 2016-04-17 MED ORDER — FENTANYL CITRATE (PF) 100 MCG/2ML IJ SOLN
INTRAMUSCULAR | Status: AC
  Filled 2016-04-17: qty 2

## 2016-04-17 MED ORDER — 0.9 % SODIUM CHLORIDE (POUR BTL) OPTIME
TOPICAL | Status: DC | PRN
  Administered 2016-04-17: 1000 mL

## 2016-04-17 MED ORDER — FENTANYL CITRATE (PF) 100 MCG/2ML IJ SOLN
25.0000 ug | INTRAMUSCULAR | Status: DC | PRN
  Administered 2016-04-17: 25 ug via INTRAVENOUS

## 2016-04-17 MED ORDER — HEPARIN SODIUM (PORCINE) 1000 UNIT/ML IJ SOLN
INTRAMUSCULAR | Status: DC | PRN
  Administered 2016-04-17: 3000 [IU] via INTRAVENOUS

## 2016-04-17 MED ORDER — MIDAZOLAM HCL 5 MG/5ML IJ SOLN
INTRAMUSCULAR | Status: DC | PRN
  Administered 2016-04-17: 2 mg via INTRAVENOUS

## 2016-04-17 MED ORDER — FENTANYL CITRATE (PF) 100 MCG/2ML IJ SOLN
INTRAMUSCULAR | Status: DC | PRN
  Administered 2016-04-17: 50 ug via INTRAVENOUS

## 2016-04-17 MED ORDER — LIDOCAINE 2% (20 MG/ML) 5 ML SYRINGE
INTRAMUSCULAR | Status: AC
  Filled 2016-04-17: qty 5

## 2016-04-17 MED ORDER — ONDANSETRON HCL 4 MG/2ML IJ SOLN
INTRAMUSCULAR | Status: DC | PRN
  Administered 2016-04-17: 4 mg via INTRAVENOUS

## 2016-04-17 MED ORDER — ONDANSETRON HCL 4 MG/2ML IJ SOLN: 4.0000 mg | Freq: Once | INTRAMUSCULAR | Status: DC | PRN

## 2016-04-17 MED ORDER — CHLORHEXIDINE GLUCONATE CLOTH 2 % EX PADS: 6.0000 | MEDICATED_PAD | Freq: Once | CUTANEOUS | Status: DC

## 2016-04-17 MED ORDER — SODIUM CHLORIDE 0.9 % IV SOLN
INTRAVENOUS | Status: DC | PRN
  Administered 2016-04-17: 500 mL

## 2016-04-17 MED ORDER — OXYCODONE-ACETAMINOPHEN 5-325 MG PO TABS: 1.0000 | ORAL_TABLET | Freq: Four times a day (QID) | ORAL | Status: AC | PRN

## 2016-04-17 MED ORDER — LIDOCAINE-EPINEPHRINE (PF) 1 %-1:200000 IJ SOLN
INTRAMUSCULAR | Status: AC
  Filled 2016-04-17: qty 30

## 2016-04-17 MED ORDER — PHENYLEPHRINE HCL 10 MG/ML IJ SOLN
10.0000 mg | INTRAVENOUS | Status: DC | PRN
  Administered 2016-04-17: 20 ug/min via INTRAVENOUS

## 2016-04-17 MED ORDER — PROPOFOL 10 MG/ML IV BOLUS
INTRAVENOUS | Status: AC
  Filled 2016-04-17: qty 40

## 2016-04-17 MED ORDER — MIDAZOLAM HCL 2 MG/2ML IJ SOLN
INTRAMUSCULAR | Status: AC
  Filled 2016-04-17: qty 2

## 2016-04-17 MED ORDER — OXYCODONE-ACETAMINOPHEN 5-325 MG PO TABS: 1.0000 | ORAL_TABLET | Freq: Four times a day (QID) | ORAL | Status: DC | PRN

## 2016-04-17 MED ORDER — PROPOFOL 10 MG/ML IV BOLUS
INTRAVENOUS | Status: DC | PRN
  Administered 2016-04-17: 120 mg via INTRAVENOUS

## 2016-04-17 SURGICAL SUPPLY — 35 items
CANISTER SUCTION 2500CC (MISCELLANEOUS) ×3 IMPLANT
CLIP TI MEDIUM 24 (CLIP) IMPLANT
CLIP TI MEDIUM 6 (CLIP) IMPLANT
CLIP TI WIDE RED SMALL 24 (CLIP) IMPLANT
CLIP TI WIDE RED SMALL 6 (CLIP) IMPLANT
COVER PROBE W GEL 5X96 (DRAPES) ×3 IMPLANT
ELECT REM PT RETURN 9FT ADLT (ELECTROSURGICAL) ×3
ELECTRODE REM PT RTRN 9FT ADLT (ELECTROSURGICAL) ×1 IMPLANT
GLOVE BIO SURGEON STRL SZ 6.5 (GLOVE) ×1 IMPLANT
GLOVE BIO SURGEONS STRL SZ 6.5 (GLOVE) ×1
GLOVE BIOGEL PI IND STRL 6.5 (GLOVE) IMPLANT
GLOVE BIOGEL PI IND STRL 7.5 (GLOVE) ×1 IMPLANT
GLOVE BIOGEL PI INDICATOR 6.5 (GLOVE) ×2
GLOVE BIOGEL PI INDICATOR 7.5 (GLOVE) ×2
GLOVE SURG SS PI 7.5 STRL IVOR (GLOVE) ×3 IMPLANT
GOWN STRL REUS W/ TWL LRG LVL3 (GOWN DISPOSABLE) ×2 IMPLANT
GOWN STRL REUS W/ TWL XL LVL3 (GOWN DISPOSABLE) ×1 IMPLANT
GOWN STRL REUS W/TWL LRG LVL3 (GOWN DISPOSABLE) ×6
GOWN STRL REUS W/TWL XL LVL3 (GOWN DISPOSABLE) ×3
HEMOSTAT SNOW SURGICEL 2X4 (HEMOSTASIS) IMPLANT
KIT BASIN OR (CUSTOM PROCEDURE TRAY) ×3 IMPLANT
KIT ROOM TURNOVER OR (KITS) ×3 IMPLANT
LIQUID BAND (GAUZE/BANDAGES/DRESSINGS) ×3 IMPLANT
NS IRRIG 1000ML POUR BTL (IV SOLUTION) ×3 IMPLANT
PACK CV ACCESS (CUSTOM PROCEDURE TRAY) ×3 IMPLANT
PAD ARMBOARD 7.5X6 YLW CONV (MISCELLANEOUS) ×6 IMPLANT
SPONGE LAP 18X18 X RAY DECT (DISPOSABLE) ×2 IMPLANT
SUT PROLENE 5 0 C 1 24 (SUTURE) ×6 IMPLANT
SUT PROLENE 6 0 CC (SUTURE) ×7 IMPLANT
SUT SILK 2 0 SH (SUTURE) ×3 IMPLANT
SUT VIC AB 3-0 SH 27 (SUTURE) ×6
SUT VIC AB 3-0 SH 27X BRD (SUTURE) ×1 IMPLANT
SUT VICRYL 4-0 PS2 18IN ABS (SUTURE) ×5 IMPLANT
UNDERPAD 30X30 INCONTINENT (UNDERPADS AND DIAPERS) ×3 IMPLANT
WATER STERILE IRR 1000ML POUR (IV SOLUTION) ×3 IMPLANT

## 2016-04-17 NOTE — Op Note (Signed)
    Patient name: [name] [name] MRN: [id number] DOB: 01-31-1961 Sex: female  04/17/2016 Pre-operative Diagnosis: ESRD Post-operative diagnosis:  Same Surgeon:  [name] Assistants:  Lennie Muckle Procedure:   2nd stage basilic vein transposition with branch ligation and primary anastamosis Anesthesia:  General Blood Loss:  See anesthesia record Specimens:  none  Findings:  Approximately 3 cm beyond the anastomosis, the vein was somewhat narrowed down.  I dilated this with sequential dilators up to a #4 dilator.  The nerve was wrapped around the vein and therefore had to transect the vein to free it from the nerve I then performed a primary end to end anastomosis  Indications:  The patient is here for second stage basilic vein procedure.  She did have a arteriogram leading up to this because of some possibility of narrowing within the vein and contralateral residual steal symptoms  Procedure:  The patient was identified in the holding area and taken to [place] [facility]  The patient was then placed supine on the table. general anesthesia was administered.  The patient was prepped and draped in the usual sterile fashion.  A time out was called and antibiotics were administered.  Ultrasound was used to mark the course of the basilic vein in the upper arm.  2 longitudinal incisions were made in the upper arm.  I dissected out the vein from these incisions.  The vein was circumferentially mobilized, protecting the surrounding nerves which were wrapped around the vein.  All side branches were ligated between 2-0 silk ties.  Once the vein was fully mobilized, it was marked for orientation.  I felt that I needed to transect the vein in order to free it from the nerve which was enveloped around it.  This was done.  The patient had narrowing of the proximal portion of the vein.  Because of her history of steal syndrome on the other side I did not want to redo the anastomosis, however once I transected the vein  I passed a sequential dilators up to a #4 dilator to dilate the vein.  I was able to get a #4 dilator across the arterial anastomosis.  There was some sclerotic appearance or feeling to the vein.  I then performed a primary end-to-end anastomosis with running 5-0 Prolene.  This was done after having given the patient 3000 units of heparin.  After was reversed with 25 mg of protamine.  The subcutaneous tissue was then reapproximated posterior to the fistula with interrupted 3-0 Vicryl.  The skin was closed directly anterior to the fistula for Vicryl followed by Dermabond.   Disposition:  To PACU in stable condition.   Theotis Burrow, M.D. [facility] [place] Office: [phone] Pager:  [phone]

## 2016-04-17 NOTE — Anesthesia Postprocedure Evaluation (Signed)
Anesthesia Post Note  Patient: [name] [name]  Procedure(s) Performed: Procedure(s) (LRB): RIGHT SECOND STAGE BASILIC VEIN TRANSPOSITION (Right)  Patient location during evaluation: PACU Anesthesia Type: General Level of consciousness: oriented, awake and awake and alert Pain management: pain level controlled Vital Signs Assessment: post-procedure vital signs reviewed and stable Respiratory status: spontaneous breathing, respiratory function stable and nonlabored ventilation Cardiovascular status: blood pressure returned to baseline Anesthetic complications: no    Last Vitals:  Filed Vitals:   04/17/16 1134 04/17/16 1147  BP:  120/49  Pulse:  80  Temp: 36.4 C 36.4 C  Resp:  14    Last Pain:  Filed Vitals:   04/17/16 1147  PainSc: 3                  Finnean Cerami COKER

## 2016-04-17 NOTE — Transfer of Care (Signed)
2Immediate Anesthesia Transfer of Care Note  Patient: [name] [name]  Procedure(s) Performed: Procedure(s): RIGHT SECOND STAGE BASILIC VEIN TRANSPOSITION (Right)  Patient Location: PACU  Anesthesia Type:General  Level of Consciousness: awake, alert , oriented and sedated  Airway & Oxygen Therapy: Patient Spontanous Breathing and Patient connected to nasal cannula oxygen  Post-op Assessment: Report given to RN, Post -op Vital signs reviewed and stable and Patient moving all extremities  Post vital signs: Reviewed and stable  Last Vitals:  Filed Vitals:   04/17/16 0622 04/17/16 0623  BP: 124/75   Pulse:  91  Temp: 36.9 C   Resp:  18    Last Pain: There were no vitals filed for this visit.    Patients Stated Pain Goal: 2 ([id number])  Complications: No apparent anesthesia complications

## 2016-04-17 NOTE — H&P (Signed)
Patient name: [name] Kinner LynchMRN: [id number] DOB: September 03, 1962Sex: female  REASON FOR VISIT: follow up  HPI: [name] [name] is a 55 y.o. female who originally underwent a staged left basilic vein transposition by Dr. Donnetta Hutching. She developed a steal syndrome and therefore Dr. [name] banded the proximal anastomosis. She continued to have symptoms and therefore I ligated this fistula. At that time she had a left-sided catheter placed and a right first stage basilic vein transposition was performed. She complained that after approximately 3 hours of treatment she gets pain and numbness in her hand. They're very cool. This causes her to have to terminate her procedures. She did state that once her dry weight was increased, she has been able to tolerate dialysis sessions without significant difficulty.  While not on dialysis, the patient states that she does not have any significant hand problems. She does have a history of stroke which affected her left arm.  She has residual numbness in her left arm.  Current Outpatient Prescriptions  Medication Sig Dispense Refill  . acetaminophen (TYLENOL) 325 MG tablet Take 2 tablets (650 mg total) by mouth every 4 (four) hours as needed for mild pain.    Marland Kitchen ALPRAZolam (XANAX) 0.25 MG tablet Take 1 tablet (0.25 mg total) by mouth every 8 (eight) hours as needed for anxiety (0.25mg -0.5mg ). 60 tablet 0  . cinacalcet (SENSIPAR) 30 MG tablet Take 30 mg by mouth daily.    Marland Kitchen docusate sodium (COLACE) 50 MG capsule Take 50 mg by mouth 2 (two) times daily. Reported on 12/12/2015    . HYDROcodone-acetaminophen (NORCO/VICODIN) 5-325 MG tablet Take 1 after dialysis as needed for pain 30 tablet 0  . hydroxychloroquine (PLAQUENIL) 200 MG tablet Take 1 tablet (200 mg total) by mouth 2 (two) times daily. 60 tablet 1  . lidocaine-prilocaine (EMLA) cream Apply 1 application topically as needed.    . multivitamin  (RENA-VIT) TABS tablet Take 1 tablet by mouth daily.    . pantoprazole (PROTONIX) 40 MG tablet Take 1 tablet (40 mg total) by mouth daily. 30 tablet 11  . pravastatin (PRAVACHOL) 20 MG tablet Take 20 mg by mouth daily.    . pregabalin (LYRICA) 75 MG capsule Take 1 capsule (75 mg total) by mouth 2 (two) times daily. 60 capsule 5  . RENVELA 2.4 G PACK Take 2.4-4.8 g by mouth 3 (three) times daily with meals. 2 PACKS WITH MEALS 3 TIMES DAILY, 1 PACK WITH SNACKS    . temazepam (RESTORIL) 30 MG capsule Take 30 mg by mouth at bedtime as needed for sleep. Reported on 12/12/2015    . thiamine 100 MG tablet Take 1 tablet (100 mg total) by mouth daily. 30 tablet 1   No current facility-administered medications for this visit.    REVIEW OF SYSTEMS:  [X]  denotes positive finding, [ ]  denotes negative finding Cardiac  Comments:  Chest pain or chest pressure:    Shortness of breath upon exertion:    Short of breath when lying flat:    Irregular heart rhythm:    Constitutional    Fever or chills:      PHYSICAL EXAM: Filed Vitals:   02/25/16 0844 02/25/16 0847  BP: 144/81 148/83  Pulse: 81   Height: 5\' 5"  (1.651 m)   Weight: 149 lb (67.586 kg)   SpO2: 94%     GENERAL: The patient is a well-nourished female, in no acute distress. The vital signs are documented above. CARDIOVASCULAR: There is a regular rate and rhythm. PULMONARY: There  is good air exchange bilaterally without wheezing or rales. Palpable thrill within fistula on the right. Radial pulse is not palpable bilateral  Data: I have ordered a fistula duplex. There appears to be a stenosis within the brachial artery, proximal to the arterial venous anastomosis. In addition, there appears to be a short segment narrowing within the basilic vein just beyond the anastomosis.  MEDICAL ISSUES: I discussed with the patient that I would recommend proceeding with  angiography to better define her anatomy. Specifically, I want to focus on the potential narrowing within the brachial artery on the right just proximal to the arteriovenous anastomosis, and secondly to evaluate the proximal portion of the basilic vein transposition to see what will need to be done in the operating room to get her a good working fistula and to potentially alleviate symptoms of steal. Also, because she has persistent symptoms of left arm, I will get a left upper extremity angiogram to see if there is any obvious source for her discomfort. If something is amenable to percutaneous intervention I would consider it at that time. This is been scheduled for Tuesday, May 16. The patient is going to the beach, that is what is not done sooner.  [name] [facility] [place] [employer]: [phone]       Angio showed L SCA stenosis which was stented.  There was narrowing in the proximal L BVT but decision not to address it due to history of steal and that vein has developed.  Plan 2nd stage BVT.  D/w patient.  All questions answered  CV:RRR Pulm: non labored breathing Palp L BVT   Wells BRabham

## 2016-04-17 NOTE — Anesthesia Preprocedure Evaluation (Signed)
Anesthesia Evaluation  Patient identified by MRN, date of birth, ID band Patient awake    Reviewed: Allergy & Precautions, NPO status , Patient's Chart, lab work & pertinent test results  Airway Mallampati: II  TM Distance: >3 FB Neck ROM: Full    Dental  (+) Teeth Intact, Dental Advisory Given   Pulmonary former smoker,    breath sounds clear to auscultation       Cardiovascular hypertension,  Rhythm:Regular Rate:Normal     Neuro/Psych    GI/Hepatic   Endo/Other  diabetes  Renal/GU      Musculoskeletal   Abdominal   Peds  Hematology   Anesthesia Other Findings   Reproductive/Obstetrics                             Anesthesia Physical Anesthesia Plan  ASA: III  Anesthesia Plan: General   Post-op Pain Management:    Induction: Intravenous  Airway Management Planned: Oral ETT  Additional Equipment:   Intra-op Plan:   Post-operative Plan: Extubation in OR  Informed Consent: I have reviewed the patients History and Physical, chart, labs and discussed the procedure including the risks, benefits and alternatives for the proposed anesthesia with the patient or authorized representative who has indicated his/her understanding and acceptance.   Dental advisory given  Plan Discussed with:   Anesthesia Plan Comments:         Anesthesia Quick Evaluation

## 2016-04-18 ENCOUNTER — Encounter (HOSPITAL_COMMUNITY): Payer: Self-pay | Admitting: Surgery

## 2016-04-19 ENCOUNTER — Telehealth: Payer: Self-pay | Admitting: Surgery

## 2016-04-19 NOTE — Telephone Encounter (Signed)
-----   Message from Mena Goes, RN sent at 04/17/2016 10:11 AM EDT ----- Regarding: schedule   ----- Message -----    From: Ulyses Amor, PA-C    Sent: 04/17/2016   9:52 AM      To: Vvs Charge Pool  F/U in 2-3 weeks with Dr. Trula Slade s/p superficialization of right av fistula

## 2016-04-19 NOTE — Telephone Encounter (Signed)
Sched appt 6/26 at 11. Lm on hm# to inform pt.

## 2016-04-21 ENCOUNTER — Encounter: Payer: Self-pay | Admitting: Surgery

## 2016-04-28 ENCOUNTER — Ambulatory Visit (INDEPENDENT_AMBULATORY_CARE_PROVIDER_SITE_OTHER): Payer: Managed Care, Other (non HMO) | Admitting: Surgery

## 2016-04-28 DIAGNOSIS — N186 End stage renal disease: Secondary | ICD-10-CM

## 2016-04-28 DIAGNOSIS — Z992 Dependence on renal dialysis: Secondary | ICD-10-CM

## 2016-04-28 NOTE — Progress Notes (Signed)
No show

## 2016-05-22 ENCOUNTER — Encounter: Payer: Self-pay | Admitting: Surgery

## 2016-06-02 ENCOUNTER — Encounter: Payer: Managed Care, Other (non HMO) | Admitting: Surgery

## 2016-06-09 ENCOUNTER — Other Ambulatory Visit: Payer: Self-pay

## 2016-06-09 DIAGNOSIS — T8249XA Other complication of vascular dialysis catheter, initial encounter: Secondary | ICD-10-CM

## 2016-06-10 ENCOUNTER — Telehealth (HOSPITAL_COMMUNITY): Payer: Self-pay | Admitting: Surgery

## 2016-06-10 NOTE — Telephone Encounter (Signed)
Called pt, left VM for her to call to schedule appt. JM 

## 2016-06-11 ENCOUNTER — Other Ambulatory Visit: Payer: Self-pay | Admitting: Radiology

## 2016-06-12 ENCOUNTER — Other Ambulatory Visit: Payer: Self-pay | Admitting: Surgery

## 2016-06-12 ENCOUNTER — Encounter (HOSPITAL_COMMUNITY): Payer: Self-pay

## 2016-06-12 ENCOUNTER — Ambulatory Visit (HOSPITAL_COMMUNITY)
Admission: RE | Admit: 2016-06-12 | Discharge: 2016-06-12 | Disposition: A | Discharge status: Home / Self Care | Payer: Managed Care, Other (non HMO) | Source: Ambulatory Visit | Attending: Surgery | Admitting: Surgery

## 2016-06-12 DIAGNOSIS — I69354 Hemiplegia and hemiparesis following cerebral infarction affecting left non-dominant side: Secondary | ICD-10-CM | POA: Insufficient documentation

## 2016-06-12 DIAGNOSIS — N186 End stage renal disease: Secondary | ICD-10-CM | POA: Insufficient documentation

## 2016-06-12 DIAGNOSIS — Z4901 Encounter for fitting and adjustment of extracorporeal dialysis catheter: Secondary | ICD-10-CM | POA: Insufficient documentation

## 2016-06-12 DIAGNOSIS — Z86010 Personal history of colonic polyps: Secondary | ICD-10-CM | POA: Diagnosis not present

## 2016-06-12 DIAGNOSIS — E1122 Type 2 diabetes mellitus with diabetic chronic kidney disease: Secondary | ICD-10-CM | POA: Diagnosis not present

## 2016-06-12 DIAGNOSIS — E785 Hyperlipidemia, unspecified: Secondary | ICD-10-CM | POA: Insufficient documentation

## 2016-06-12 DIAGNOSIS — Z8249 Family history of ischemic heart disease and other diseases of the circulatory system: Secondary | ICD-10-CM | POA: Insufficient documentation

## 2016-06-12 DIAGNOSIS — F419 Anxiety disorder, unspecified: Secondary | ICD-10-CM | POA: Insufficient documentation

## 2016-06-12 DIAGNOSIS — M3214 Glomerular disease in systemic lupus erythematosus: Secondary | ICD-10-CM | POA: Diagnosis not present

## 2016-06-12 DIAGNOSIS — I69391 Dysphagia following cerebral infarction: Secondary | ICD-10-CM | POA: Insufficient documentation

## 2016-06-12 DIAGNOSIS — T8249XA Other complication of vascular dialysis catheter, initial encounter: Secondary | ICD-10-CM

## 2016-06-12 DIAGNOSIS — K922 Gastrointestinal hemorrhage, unspecified: Secondary | ICD-10-CM | POA: Insufficient documentation

## 2016-06-12 DIAGNOSIS — G8929 Other chronic pain: Secondary | ICD-10-CM | POA: Diagnosis not present

## 2016-06-12 DIAGNOSIS — M4802 Spinal stenosis, cervical region: Secondary | ICD-10-CM | POA: Insufficient documentation

## 2016-06-12 DIAGNOSIS — R1310 Dysphagia, unspecified: Secondary | ICD-10-CM | POA: Insufficient documentation

## 2016-06-12 DIAGNOSIS — I252 Old myocardial infarction: Secondary | ICD-10-CM | POA: Diagnosis not present

## 2016-06-12 DIAGNOSIS — Z833 Family history of diabetes mellitus: Secondary | ICD-10-CM | POA: Insufficient documentation

## 2016-06-12 DIAGNOSIS — D649 Anemia, unspecified: Secondary | ICD-10-CM | POA: Insufficient documentation

## 2016-06-12 DIAGNOSIS — I120 Hypertensive chronic kidney disease with stage 5 chronic kidney disease or end stage renal disease: Secondary | ICD-10-CM | POA: Insufficient documentation

## 2016-06-12 DIAGNOSIS — Z87891 Personal history of nicotine dependence: Secondary | ICD-10-CM | POA: Insufficient documentation

## 2016-06-12 DIAGNOSIS — M25579 Pain in unspecified ankle and joints of unspecified foot: Secondary | ICD-10-CM | POA: Insufficient documentation

## 2016-06-12 HISTORY — PX: IR GENERIC HISTORICAL: IMG1180011

## 2016-06-12 MED ORDER — SODIUM CHLORIDE 0.9 % IV SOLN: INTRAVENOUS | Status: DC

## 2016-06-12 MED ORDER — IOPAMIDOL (ISOVUE-300) INJECTION 61%
INTRAVENOUS | Status: AC
  Administered 2016-06-12: 50 mL
  Filled 2016-06-12: qty 100

## 2016-06-12 NOTE — H&P (Signed)
Chief Complaint: Patient was seen in consultation today for right upper arm dialysis access shuntogram with possible thrombolysis  at the request of [name] [name]  Referring Physician(s): [name] [name]  Supervising Physician: [name] [name]  Patient Status: Inpatient  History of Present Illness: [name] [name] is a 55 y.o. female   Procedure:   2nd stage basilic vein transposition with branch ligation and primary anastamosis per Dr Trula Slade 04/17/16  Has been maturing since procedure Pt is using left IJ tunneled hemodialysis catheter for dialysis  No issues with catheter and dialyzing well Last dialysis 8/9 Pt states RN felt access was "clotting" Scheduled for evaluation and possible thrombolysis with angioplasty/stent placement  Past Medical History:  Diagnosis Date  . Anemia   . Anxiety   . Arthritis    RA  . Blood transfusion without reported diagnosis   . Chronic ankle pain    due to RA?  . CKD (chronic kidney disease)    due to lupus/Dr. Justin [name]  . Colitis    ? per hospital notes 11/2014  . Diabetes mellitus (HCC)    border line  . Hemorrhoids   . Hyperlipidemia   . Hyperplastic colon polyp   . Hypertension   . Hyponatremia   . Lower GI bleed   . Lupus ([place])   . 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 Metabolic acidosis   . Metatarsal bone fracture right   4th  . Myocardial infarction ([place])   . Pain in joints   . Paresthesias   . Pneumonia   . Stenosis of cervical spine region    with HNP at C5/6, C6/7  . Stroke [facility]) 11/2014   left sided weakness, dysphagia    Past Surgical History:  Procedure Laterality Date  . AV FISTULA PLACEMENT Right 12/21/2015   Procedure: ARTERIOVENOUS (AV) FISTULA CREATION RIGHT ARM;  Surgeon: [name] Mitchell, MD;  Location: [place];  Service: Vascular;  Laterality: Right;  . BASCILIC VEIN TRANSPOSITION Left 02/14/2015   Procedure: BASILIC VEIN Fistula Creation First Stage;  Surgeon: Rosetta Posner, MD;  Location: [place];   Service: Vascular;  Laterality: Left;  . BASCILIC VEIN TRANSPOSITION Left 03/28/2015   Procedure: LEFT ARM SECOND STAGE BASCILIC VEIN TRANSPOSITION;  Surgeon: Rosetta Posner, MD;  Location: [place];  Service: Vascular;  Laterality: Left;  . BASCILIC VEIN TRANSPOSITION Right 04/17/2016   Procedure: RIGHT SECOND STAGE BASILIC VEIN TRANSPOSITION;  Surgeon: [name] Mitchell, MD;  Location: [place];  Service: Vascular;  Laterality: Right;  . BREAST BIOPSY    . COLONOSCOPY W/ BIOPSIES AND POLYPECTOMY    . INSERTION OF DIALYSIS CATHETER Right 02/05/2015   Procedure: INSERTION OF DIALYSIS CATHETER - RIGHT INTERNAL JUGULAR;  Surgeon: Conrad Camanche [place], MD;  Location: [place];  Service: Vascular;  Laterality: Right;  . INSERTION OF DIALYSIS CATHETER N/A 10/05/2015   Procedure: INSERTION OF DIALYSIS CATHETER Right Internal Jugular.;  Surgeon: Mal Misty, MD;  Location: [place];  Service: Vascular;  Laterality: N/A;  . INSERTION OF DIALYSIS CATHETER Left 12/21/2015   Procedure: INSERTION OF DIALYSIS CATHETER LEFT INTERNAL JUGULAR VEIN;  Surgeon: [name] Mitchell, MD;  Location: [place];  Service: Vascular;  Laterality: Left;  . LIGATION OF ARTERIOVENOUS  FISTULA Left 12/21/2015   Procedure: LIGATION OF ARTERIOVENOUS  FISTULA LEFT ARM;  Surgeon: [name] Mitchell, MD;  Location: [place];  Service: Vascular;  Laterality: Left;  . PERIPHERAL VASCULAR CATHETERIZATION N/A 10/10/2015   Procedure: Fistulagram;  Surgeon: [name] Mitchell, MD;  Location: [place] CV [facility];  Service: Cardiovascular;  Laterality: N/A;  . PERIPHERAL VASCULAR CATHETERIZATION N/A 03/18/2016   Procedure: Aortic Arch Angiography;  Surgeon: [name] Mitchell, MD;  Location: [place] [facility];  Service: Cardiovascular;  Laterality: N/A;  . PERIPHERAL VASCULAR CATHETERIZATION Left 03/18/2016   Procedure: Peripheral Vascular Balloon Angioplasty;  Surgeon: [name] Mitchell, MD;  Location: [place] CV [facility];  Service: Cardiovascular;  Laterality: Left;  subclavian  . REVISON  OF ARTERIOVENOUS FISTULA Left 09/26/2015   Procedure: Revision Left Basilic Vein Transposition with Banding using Gortex graft;  Surgeon: Mal Misty, MD;  Location: [place];  Service: Vascular;  Laterality: Left;  . TUBAL LIGATION      Allergies: Sulfa antibiotics  Medications: Prior to Admission medications   Medication Sig Start Date End Date Taking? Authorizing Provider  ALPRAZolam (XANAX) 0.25 MG tablet Take 1 tablet (0.25 mg total) by mouth every 8 (eight) hours as needed for anxiety (0.25mg -0.5mg ). Patient taking differently: Take 0.25-0.5 mg by mouth every 8 (eight) hours as needed for anxiety.  02/22/15  Yes Daniel J Angiulli, PA-C  cinacalcet (SENSIPAR) 30 MG tablet Take 30 mg by mouth daily.   Yes Historical Provider, MD  docusate sodium (COLACE) 50 MG capsule Take 50 mg by mouth daily as needed for mild constipation. Reported on 12/12/2015   Yes Historical Provider, MD  HYDROcodone-acetaminophen (NORCO/VICODIN) 5-325 MG tablet Take 1 after dialysis as needed for pain Patient taking differently: Take 1 tablet by mouth every 6 (six) hours as needed for moderate pain.  01/07/16  Yes Gay Filler Copland, MD  hydroxychloroquine (PLAQUENIL) 200 MG tablet Take 1 tablet (200 mg total) by mouth 2 (two) times daily. Patient taking differently: Take 200-400 mg by mouth daily. Alternates taking 200mg  and 400mg  each day. 07/02/15  Yes [name], MD  lidocaine-prilocaine (EMLA) cream Apply 1 application topically as needed (For port-a-cath.).    Yes Historical Provider, MD  multivitamin (RENA-VIT) TABS tablet Take 1 tablet by mouth daily.   Yes Historical Provider, MD  pantoprazole (PROTONIX) 40 MG tablet Take 1 tablet (40 mg total) by mouth daily. 02/22/15  Yes Daniel J Angiulli, PA-C  pravastatin (PRAVACHOL) 20 MG tablet Take 20 mg by mouth daily.   Yes Historical Provider, MD  pregabalin (LYRICA) 75 MG capsule Take 1 capsule (75 mg total) by mouth 2 (two) times daily. Patient taking  differently: Take 150 mg by mouth 2 (two) times daily.  01/07/16  Yes Gay Filler Copland, MD  RENVELA 800 MG tablet Take 2,400-4,800 mg by mouth See admin instructions. Takes six tablets three times daily with each meal and three tablets with snacks. 03/27/16  Yes Historical Provider, MD  temazepam (RESTORIL) 30 MG capsule Take 30 mg by mouth at bedtime as needed for sleep.    Yes Historical Provider, MD  thiamine 100 MG tablet Take 1 tablet (100 mg total) by mouth daily. 02/22/15  Yes Daniel J Angiulli, PA-C  oxyCODONE-acetaminophen (PERCOCET/ROXICET) 5-325 MG tablet Take 1 tablet by mouth every 6 (six) hours as needed. Patient not taking: Reported on 06/11/2016 04/17/16   Ulyses Amor, PA-C     Family History  Problem Relation Age of Onset  . Hypertension    . Lupus    . Rheum arthritis    . Hypertension Mother   . Diabetes Mother   . Hypertension Sister   . Diabetes Father   . Hypertension Maternal Grandmother   .  Colon cancer Neg Hx     Social History   Social History  . Marital status: Single    Spouse name: N/A  . Number of children: N/A  . Years of education: N/A   Social History Main Topics  . Smoking status: Former Smoker    Packs/day: 0.50    Years: 30.00    Types: Cigarettes    Quit date: 09/22/2014  . Smokeless tobacco: Never Used  . Alcohol use No  . Drug use: No  . Sexual activity: Not Asked   Other Topics Concern  . None   Social History Narrative  . None     Review of Systems: A 12 point ROS discussed and pertinent positives are indicated in the HPI above.  All other systems are negative.  Review of Systems  Constitutional: Negative for activity change, appetite change, fatigue, fever and unexpected weight change.  Respiratory: Negative for shortness of breath.   Psychiatric/Behavioral: Negative for behavioral problems and confusion.    Vital Signs: BP 122/77 (BP Location: Left Arm)   Pulse 81   Resp 14   LMP 07/18/2012   SpO2 100%   Physical  Exam  Constitutional: She is oriented to person, place, and time.  Cardiovascular: Normal rate, regular rhythm and normal heart sounds.   Pulmonary/Chest: Effort normal and breath sounds normal.  Abdominal: Soft. Bowel sounds are normal. There is no tenderness.  Musculoskeletal: Normal range of motion.  Rt upper arm dialysis access 2+ pulses  Neurological: She is alert and oriented to person, place, and time.  Skin: Skin is warm and dry.  Psychiatric: She has a normal mood and affect. Her behavior is normal. Judgment and thought content normal.    Mallampati Score:  MD Evaluation Airway: WNL Heart: WNL Abdomen: WNL Chest/ Lungs: WNL ASA  Classification: 3 Mallampati/Airway Score: One  Imaging: No results found.  Labs:  CBC:  Recent Labs  12/30/15 0419 12/31/15 0315 01/01/16 0736 01/07/16 1227 03/18/16 0608 04/17/16 0713  WBC 9.2 9.5 6.7 7.1  --   --   HGB 9.9* 10.1* 9.8* 11.1* 15.3* 14.6  HCT 31.0* 31.1* 31.0* 34.8* 45.0 43.0  PLT 125* 145* 164 259.0  --   --     COAGS: No results for input(s): INR, APTT in the last 8760 hours.  BMP:  Recent Labs  12/30/15 0005 12/30/15 0419 12/31/15 0315 01/01/16 0735 01/07/16 1227 03/18/16 0608 04/17/16 0713  NA 136 138 134* 133* 138 139 138  K 4.4 4.7 4.2 4.8 4.6 4.9 4.9  CL 97* 100* 92* 94* 96 101  --   CO2 22 20* 25 24 27   --   --   GLUCOSE 94 72 81 90 85 76 93  BUN 23* 24* 36* 47* 45* 38*  --   CALCIUM 8.4* 8.6* 7.9* 8.1* 9.5  --   --   CREATININE 6.49* 6.85* 8.86* 11.14* 9.10* 7.00*  --   GFRNONAA 7* 6* 5* 3*  --   --   --   GFRAA 8* 7* 5* 4*  --   --   --     LIVER FUNCTION TESTS:  Recent Labs  12/30/15 0419 12/31/15 0315 01/01/16 0735 01/07/16 1227 01/16/16 1017  BILITOT 1.0 0.9  --  0.4 0.4  AST 25 21  --  23 30  ALT 12* 11*  --  14 8  ALKPHOS 82 84  --  91 98  PROT 6.6 6.5  --  7.9 8.3  ALBUMIN 2.2*  2.1* 2.1* 3.6 3.7    TUMOR MARKERS: No results for input(s): AFPTM, CEA, CA199,  CHROMGRNA in the last 8760 hours.  Assessment and Plan:  Right basilic vein transposition dialysis access 04/17/16 Not using for dialysis -- still maturing L IJ dialysis catheter intact Scheduled today for evaluation of Rt upper arm dialysis access evaluation  Shuntogram with possible thrombolysis with possible angioplasty/stent placement. Risks and Benefits discussed with the patient including, but not limited to bleeding, infection, vascular injury, pulmonary embolism, need for tunneled HD catheter placement or even death. All of the patient's questions were answered, patient is agreeable to proceed. Consent signed and in chart.   Thank you for this interesting consult.  I greatly enjoyed meeting URSALA KUBIT and look forward to participating in their care.  A copy of this report was sent to the requesting provider on this date.  Electronically Signed: Monia Sabal A 06/12/2016, 1:55 PM   I spent a total of  30 Minutes   in face to face in clinical consultation, greater than 50% of which was counseling/coordinating care for rt upper arm dialysis access evaluation with possible declot

## 2016-06-17 ENCOUNTER — Encounter: Payer: Self-pay | Admitting: Surgery

## 2016-06-19 ENCOUNTER — Encounter: Payer: Self-pay | Admitting: Neurology

## 2016-06-19 ENCOUNTER — Ambulatory Visit (INDEPENDENT_AMBULATORY_CARE_PROVIDER_SITE_OTHER): Payer: Managed Care, Other (non HMO) | Admitting: Neurology

## 2016-06-19 VITALS — BP 115/82 | HR 106 | Ht 65.0 in | Wt 143.8 lb

## 2016-06-19 DIAGNOSIS — Z8673 Personal history of transient ischemic attack (TIA), and cerebral infarction without residual deficits: Secondary | ICD-10-CM | POA: Diagnosis not present

## 2016-06-19 DIAGNOSIS — R202 Paresthesia of skin: Secondary | ICD-10-CM | POA: Diagnosis not present

## 2016-06-19 DIAGNOSIS — M329 Systemic lupus erythematosus, unspecified: Secondary | ICD-10-CM | POA: Diagnosis not present

## 2016-06-19 DIAGNOSIS — I69398 Other sequelae of cerebral infarction: Secondary | ICD-10-CM

## 2016-06-19 DIAGNOSIS — I69359 Hemiplegia and hemiparesis following cerebral infarction affecting unspecified side: Secondary | ICD-10-CM | POA: Diagnosis not present

## 2016-06-19 DIAGNOSIS — M79609 Pain in unspecified limb: Secondary | ICD-10-CM

## 2016-06-19 HISTORY — DX: Hemiplegia and hemiparesis following cerebral infarction affecting unspecified side: I69.398

## 2016-06-19 HISTORY — DX: Other sequelae of cerebral infarction: I69.359

## 2016-06-19 MED ORDER — NORTRIPTYLINE HCL 10 MG PO CAPS: ORAL_CAPSULE | ORAL | 3 refills | Status: AC

## 2016-06-19 NOTE — Progress Notes (Signed)
Reason for visit: Paresthesias  Referring physician: Dr. Georgiann Hahn is a 55 y.o. female  History of present illness:  [name] [name] is a 55 year old right-handed black female with a history of lupus with end-stage renal disease on hemodialysis. The patient suffered a brainstem bleed in the right paramedian pontine area around 10/03/2014. The patient indicates that since that time she has had sensory alteration on the left side of the body including the arm and leg, excluding the face. The patient has paresthesias that are uncomfortable. She has had good improvement in coordination of the left side and with her balance, but she still requires a cane for ambulation at times. The patient denies any speech or swallowing issues, no double vision or loss of vision. She denies any headache. She gets a cold sensation in the left foot, she wears a sock on the foot at night. She has difficulty sleeping because of the discomfort. She has been placed on Lyrica with some benefit, but at a dose of 75 mg twice daily, the pain is not fully controlled. The patient is sent to this office for further evaluation. The symptoms have not changed since the stroke event.  Past Medical History:  Diagnosis Date  . Anemia   . Anxiety   . Arthritis    RA  . Blood transfusion without reported diagnosis   . Chronic ankle pain    due to RA?  . CKD (chronic kidney disease)    due to lupus/Dr. [name]  . Colitis    ? per hospital notes 11/2014  . Diabetes mellitus (HCC)    border line  . Hemorrhoids   . Hyperlipidemia   . Hyperplastic colon polyp   . Hypertension   . Hyponatremia   . Lower GI bleed   . Lupus ([place])   . 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 Metabolic acidosis   . Metatarsal bone fracture right   4th  . Myocardial infarction ([place])   . Pain in joints   . Paresthesias   . Pneumonia   . Stenosis of cervical spine region    with HNP at C5/6, C6/7  . Stroke [facility]) 11/2014   left sided weakness, dysphagia    Past Surgical History:  Procedure Laterality Date  . AV FISTULA PLACEMENT Right 12/21/2015   Procedure: ARTERIOVENOUS (AV) FISTULA CREATION RIGHT ARM;  Surgeon: [name], MD;  Location: [place];  Service: Vascular;  Laterality: Right;  . BASCILIC VEIN TRANSPOSITION Left 02/14/2015   Procedure: BASILIC VEIN Fistula Creation First Stage;  Surgeon: Rosetta Posner, MD;  Location: [place];  Service: Vascular;  Laterality: Left;  . BASCILIC VEIN TRANSPOSITION Left 03/28/2015   Procedure: LEFT ARM SECOND STAGE BASCILIC VEIN TRANSPOSITION;  Surgeon: Rosetta Posner, MD;  Location: [place];  Service: Vascular;  Laterality: Left;  . BASCILIC VEIN TRANSPOSITION Right 04/17/2016   Procedure: RIGHT SECOND STAGE BASILIC VEIN TRANSPOSITION;  Surgeon: [name], MD;  Location: [place];  Service: Vascular;  Laterality: Right;  . BREAST BIOPSY    . COLONOSCOPY W/ BIOPSIES AND POLYPECTOMY    . INSERTION OF DIALYSIS CATHETER Right 02/05/2015   Procedure: INSERTION OF DIALYSIS CATHETER - RIGHT INTERNAL JUGULAR;  Surgeon: Conrad Celebration, MD;  Location: [place];  Service: Vascular;  Laterality: Right;  . INSERTION OF DIALYSIS CATHETER N/A 10/05/2015   Procedure: INSERTION OF DIALYSIS CATHETER Right Internal Jugular.;  Surgeon: Mal Misty, MD;  Location: [place];  Service: Vascular;  Laterality: N/A;  . INSERTION OF DIALYSIS CATHETER Left 12/21/2015   Procedure: INSERTION OF DIALYSIS CATHETER LEFT INTERNAL JUGULAR VEIN;  Surgeon: [name], MD;  Location: [place];  Service: Vascular;  Laterality: Left;  . IR GENERIC HISTORICAL Right 06/12/2016   IR DIALY SHUNT INTRO NEEDLE/INTRACATH INITIAL W/IMG RIGHT 06/12/2016 Markus Daft, MD [facility]-INTERV RAD  . LIGATION OF ARTERIOVENOUS  FISTULA Left 12/21/2015   Procedure: LIGATION OF ARTERIOVENOUS  FISTULA LEFT ARM;  Surgeon: [name], MD;  Location: [place];  Service: Vascular;  Laterality: Left;  . PERIPHERAL VASCULAR CATHETERIZATION N/A 10/10/2015     Procedure: Fistulagram;  Surgeon: [name], MD;  Location: [place] [facility];  Service: Cardiovascular;  Laterality: N/A;  . PERIPHERAL VASCULAR CATHETERIZATION N/A 03/18/2016   Procedure: Aortic Arch Angiography;  Surgeon: [name], MD;  Location: [place] [facility];  Service: Cardiovascular;  Laterality: N/A;  . PERIPHERAL VASCULAR CATHETERIZATION Left 03/18/2016   Procedure: Peripheral Vascular Balloon Angioplasty;  Surgeon: [name], MD;  Location: [place] [facility];  Service: Cardiovascular;  Laterality: Left;  subclavian  . REVISON OF ARTERIOVENOUS FISTULA Left 09/26/2015   Procedure: Revision Left Basilic Vein Transposition with Banding using Gortex graft;  Surgeon: Mal Misty, MD;  Location: [place];  Service: Vascular;  Laterality: Left;  . TUBAL LIGATION      Family History  Problem Relation Age of Onset  . Hypertension Mother   . Diabetes Mother   . Stroke Mother   . Hypertension Sister   . Diabetes Father   . Hypertension Maternal Grandmother   . Hypertension    . Lupus    . Rheum arthritis    . Colon cancer Neg Hx     Social history:  reports that she quit smoking about 20 months ago. Her smoking use included Cigarettes. She has a 15.00 pack-year smoking history. She has never used smokeless tobacco. She reports that she drinks alcohol. She reports that she does not use drugs.  Medications:  Prior to Admission medications   Medication Sig Start Date End Date Taking? Authorizing Provider  ALPRAZolam (XANAX) 0.25 MG tablet Take 1 tablet (0.25 mg total) by mouth every 8 (eight) hours as needed for anxiety (0.25mg -0.5mg ). Patient taking differently: Take 0.25-0.5 mg by mouth every 8 (eight) hours as needed for anxiety.  02/22/15  Yes Daniel J Angiulli, PA-C  cinacalcet (SENSIPAR) 30 MG tablet Take 30 mg by mouth daily.   Yes Historical Provider, MD  docusate sodium (COLACE) 50 MG capsule Take 50 mg by mouth daily as needed for mild constipation.  Reported on 12/12/2015   Yes Historical Provider, MD  HYDROcodone-acetaminophen (NORCO/VICODIN) 5-325 MG tablet Take 1 after dialysis as needed for pain Patient taking differently: Take 1 tablet by mouth every 6 (six) hours as needed for moderate pain.  01/07/16  Yes Gay Filler Copland, MD  hydroxychloroquine (PLAQUENIL) 200 MG tablet Take 1 tablet (200 mg total) by mouth 2 (two) times daily. Patient taking differently: Take 200-400 mg by mouth daily. Alternates taking 200mg  and 400mg  each day. 07/02/15  Yes [name], MD  lidocaine-prilocaine (EMLA) cream Apply 1 application topically as needed (For port-a-cath.).    Yes Historical Provider, MD  multivitamin (RENA-VIT) TABS tablet Take 1 tablet by mouth daily.   Yes Historical Provider, MD  oxyCODONE-acetaminophen (PERCOCET/ROXICET) 5-325 MG tablet Take 1 tablet by mouth every 6 (six) hours as needed. 04/17/16  Yes Ulyses Amor, PA-C  pantoprazole (PROTONIX) 40 MG tablet Take 1 tablet (40 mg total) by mouth daily. 02/22/15  Yes Daniel J Angiulli, PA-C  pravastatin (PRAVACHOL) 20 MG tablet Take 20 mg by mouth daily.   Yes Historical Provider, MD  pregabalin (LYRICA) 75 MG capsule Take 1 capsule (75 mg total) by mouth 2 (two) times daily. Patient taking differently: Take 150 mg by mouth 2 (two) times daily.  01/07/16  Yes Gay Filler Copland, MD  RENVELA 800 MG tablet Take 2,400-4,800 mg by mouth See admin instructions. Takes six tablets three times daily with each meal and three tablets with snacks. 03/27/16  Yes Historical Provider, MD  temazepam (RESTORIL) 30 MG capsule Take 30 mg by mouth at bedtime as needed for sleep.    Yes Historical Provider, MD  thiamine 100 MG tablet Take 1 tablet (100 mg total) by mouth daily. 02/22/15  Yes Daniel J Angiulli, PA-C      Allergies  Allergen Reactions  . Sulfa Antibiotics Itching    ROS:  Out of a complete 14 system review of symptoms, the patient complains only of the following symptoms, and all other  reviewed systems are negative.  Numbness, weakness Restless legs  Blood pressure 115/82, pulse (!) 106, height 5\' 5"  (1.651 m), weight 143 lb 12 oz (65.2 kg), last menstrual period 07/18/2012.  Physical Exam  General: The patient is alert and cooperative at the time of the examination.  Eyes: Pupils are equal, round, and reactive to light. Discs are flat bilaterally.  Neck: The neck is supple, no carotid bruits are noted.  Respiratory: The respiratory examination is clear.  Cardiovascular: The cardiovascular examination reveals a regular rate and rhythm, no obvious murmurs or rubs are noted.  Skin: Extremities are without significant edema.  Neurologic Exam  Mental status: The patient is alert and oriented x 3 at the time of the examination. The patient has apparent normal recent and remote memory, with an apparently normal attention span and concentration ability.  Cranial nerves: Facial symmetry is present. There is good sensation of the face to pinprick and soft touch bilaterally. The strength of the facial muscles and the muscles to head turning and shoulder shrug are normal bilaterally. Speech is well enunciated, no aphasia or dysarthria is noted. Extraocular movements are full. Visual fields are full. The tongue is midline, and the patient has symmetric elevation of the soft palate. No obvious hearing deficits are noted.  Motor: The motor testing reveals 5 over 5 strength of all 4 extremities. Good symmetric motor tone is noted throughout.  Sensory: Sensory testing is intact to pinprick, soft touch, vibration sensation, and position sense on all 4 extremities. No evidence of extinction is noted.  Coordination: Cerebellar testing reveals good finger-nose-finger and heel-to-shin bilaterally.  Gait and station: Gait is slightly wide-based. The patient can walk independently. Tandem gait is unsteady. Romberg is negative.  Reflexes: Deep tendon reflexes are symmetric and normal  bilaterally, with exception that the ankle jerk reflexes are depressed absent bilaterally. Toes are downgoing bilaterally.   MRI brain 10/03/14:  IMPRESSION: 1. Signal changes compatible with acute/subacute hemorrhage in the right paramedian pons. No underlying mass lesion is evident. 2. Age advanced atrophy and white matter disease likely reflects the sequela of chronic microvascular ischemia. 3. Minimal diffusion abnormality in the medial high frontal lobes bilaterally is likely related to the adjacent calcification and probable meningioma. Punctate cortical infarcts in addition to the brainstem hemorrhage are considered less likely. 4. Mild distal small vessel disease without significant  proximal stenosis, aneurysm, or branch vessel occlusion.  * MRI scan images were reviewed online. I agree with the written report.    Assessment/Plan:  1. History of right paramedian pontine intracranial hemorrhage  2. Left-sided paresthesias following stroke  3. History of lupus  4. End-stage renal disease on hemodialysis  The patient is having ongoing discomfort on the left arm and leg. The symptoms have been persistent since December 2015. The patient is on Lyrica with some benefit, I will add nortriptyline in low dose. She will follow-up in about 4 months, sooner if needed. We may adjust the dose prior to her revisit.  [name] Alexanders MD 06/19/2016 11:49 AM  [facility] [address] [place], [place] [address]  Phone [phone] Fax [phone]

## 2016-06-19 NOTE — Patient Instructions (Signed)
    Pamelor (nortriptyline) is an antidepressant medication that has many uses that may include headache, whiplash injuries, or for peripheral neuropathy pain. Side effects may include drowsiness, dry mouth, blurred vision, or constipation. As with any antidepressant medication, worsening depression may occur. If you had any significant side effects, please call our office. The full effects of this medication may take 7-10 days after starting the drug, or going up on the dose.

## 2016-06-23 ENCOUNTER — Encounter: Payer: Managed Care, Other (non HMO) | Admitting: Surgery

## 2016-07-09 ENCOUNTER — Other Ambulatory Visit: Payer: Self-pay | Admitting: Family Medicine

## 2016-07-09 DIAGNOSIS — G609 Hereditary and idiopathic neuropathy, unspecified: Secondary | ICD-10-CM

## 2016-07-09 NOTE — Telephone Encounter (Signed)
Received refill request for LYRICA 75 MG CAPSULE. Pt's last office visit and med refill was on 01/07/16. Is it ok to refill? Please advise.

## 2016-07-10 ENCOUNTER — Other Ambulatory Visit: Payer: Self-pay | Admitting: Emergency Medicine

## 2016-07-10 NOTE — Telephone Encounter (Signed)
Rx for Lyrica 75 MG was faxed to [facility].

## 2016-07-21 ENCOUNTER — Encounter: Payer: Managed Care, Other (non HMO) | Admitting: Surgery

## 2016-07-22 ENCOUNTER — Encounter: Payer: Self-pay | Admitting: Surgery

## 2016-07-28 ENCOUNTER — Ambulatory Visit (INDEPENDENT_AMBULATORY_CARE_PROVIDER_SITE_OTHER): Payer: Managed Care, Other (non HMO) | Admitting: Surgery

## 2016-07-28 ENCOUNTER — Encounter: Payer: Self-pay | Admitting: Surgery

## 2016-07-28 VITALS — BP 152/95 | HR 104 | Temp 97.6°F | Resp 16 | Ht 65.0 in | Wt 156.3 lb

## 2016-07-28 DIAGNOSIS — N186 End stage renal disease: Secondary | ICD-10-CM | POA: Diagnosis not present

## 2016-07-28 DIAGNOSIS — Z992 Dependence on renal dialysis: Secondary | ICD-10-CM | POA: Diagnosis not present

## 2016-07-28 NOTE — Progress Notes (Signed)
[facility] [place]  Patient name: [name] [name] MRN: [id number] DOB: [birth year] Sex: female  REASON FOR VISIT: follow up  HPI: [name] [name] is a 55 y.o. female who is status post right basilic vein transposition on 04/17/2016.  She is currently using a catheter for dialysis.  She has no complaints of steal in her right arm.  Past Medical History:  Diagnosis Date  . Anemia   . Anxiety   . Arthritis    RA  . Blood transfusion without reported diagnosis   . Chronic ankle pain    due to RA?  . CKD (chronic kidney disease)    due to lupus/Dr. [name]  . Colitis    ? per hospital notes 11/2014  . Diabetes mellitus (HCC)    border line  . Hemiparesis and alteration of sensations as late effects of stroke ([place]) 06/19/2016  . Hemorrhoids   . Hyperlipidemia   . Hyperplastic colon polyp   . Hypertension   . Hyponatremia   . Lower GI bleed   . Lupus ([place])   . 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 Metabolic acidosis   . Metatarsal bone fracture right   4th  . Myocardial infarction ([place])   . Pain in joints   . Paresthesias   . Pneumonia   . Stenosis of cervical spine region    with HNP at C5/6, C6/7  . Stroke [facility]) 11/2014   left sided weakness, dysphagia    Family History  Problem Relation Age of Onset  . Hypertension Mother   . Diabetes Mother   . Stroke Mother   . Hypertension Sister   . Diabetes Father   . Hypertension Maternal Grandmother   . Hypertension    . Lupus    . Rheum arthritis    . Colon cancer Neg Hx     SOCIAL HISTORY: Social History  Substance Use Topics  . Smoking status: Former Smoker    Packs/day: 0.50    Years: 30.00    Types: Cigarettes    Quit date: 09/22/2014  . Smokeless tobacco: Never Used  . Alcohol use 0.0 oz/week     Comment: Rare - red wine    Allergies  Allergen Reactions  . Sulfa Antibiotics Itching    Current Outpatient Prescriptions  Medication Sig  Dispense Refill  . ALPRAZolam (XANAX) 0.25 MG tablet Take 1 tablet (0.25 mg total) by mouth every 8 (eight) hours as needed for anxiety (0.25mg -0.5mg ). (Patient taking differently: Take 0.25-0.5 mg by mouth every 8 (eight) hours as needed for anxiety. ) 60 tablet 0  . cinacalcet (SENSIPAR) 30 MG tablet Take 30 mg by mouth daily.    Marland Kitchen docusate sodium (COLACE) 50 MG capsule Take 50 mg by mouth daily as needed for mild constipation. Reported on 12/12/2015    . hydroxychloroquine (PLAQUENIL) 200 MG tablet Take 1 tablet (200 mg total) by mouth 2 (two) times daily. (Patient taking differently: Take 200-400 mg by mouth daily. Alternates taking 200mg  and 400mg  each day.) 60 tablet 1  . lidocaine-prilocaine (EMLA) cream Apply 1 application topically as needed (For port-a-cath.).     Marland Kitchen LYRICA 75 MG capsule take 1 capsule by mouth twice a day 60 capsule 5  . multivitamin (RENA-VIT) TABS tablet Take 1 tablet by mouth daily.    . nortriptyline (PAMELOR) 10 MG capsule Take one capsule at night for one week, then take 2 capsules at night for one week, then take 3 capsules  at night 90 capsule 3  . pantoprazole (PROTONIX) 40 MG tablet Take 1 tablet (40 mg total) by mouth daily. 30 tablet 11  . pravastatin (PRAVACHOL) 20 MG tablet Take 20 mg by mouth daily.    Marland Kitchen RENVELA 800 MG tablet Take 2,400-4,800 mg by mouth See admin instructions. Takes six tablets three times daily with each meal and three tablets with snacks.  1  . temazepam (RESTORIL) 30 MG capsule Take 30 mg by mouth at bedtime as needed for sleep.     Marland Kitchen thiamine 100 MG tablet Take 1 tablet (100 mg total) by mouth daily. 30 tablet 1  . HYDROcodone-acetaminophen (NORCO/VICODIN) 5-325 MG tablet Take 1 after dialysis as needed for pain (Patient not taking: Reported on 07/28/2016) 30 tablet 0  . oxyCODONE-acetaminophen (PERCOCET/ROXICET) 5-325 MG tablet Take 1 tablet by mouth every 6 (six) hours as needed. (Patient not taking: Reported on 07/28/2016) 15 tablet 0    No current facility-administered medications for this visit.     REVIEW OF SYSTEMS:  [X]  denotes positive finding, [ ]  denotes negative finding Cardiac  Comments:  Chest pain or chest pressure:    Shortness of breath upon exertion:    Short of breath when lying flat:    Irregular heart rhythm:        Vascular    Pain in calf, thigh, or hip brought on by ambulation:    Pain in feet at night that wakes you up from your sleep:     Blood clot in your veins:    Leg swelling:         Pulmonary    Oxygen at home:    Productive cough:     Wheezing:         Neurologic    Sudden weakness in arms or legs:     Sudden numbness in arms or legs:     Sudden onset of difficulty speaking or slurred speech:    Temporary loss of vision in one eye:     Problems with dizziness:         Gastrointestinal    Blood in stool:     Vomited blood:         Genitourinary    Burning when urinating:     Blood in urine:        Psychiatric    Major depression:         Hematologic    Bleeding problems:    Problems with blood clotting too easily:        Skin    Rashes or ulcers:        Constitutional    Fever or chills:      PHYSICAL EXAM: Vitals:   07/28/16 0842 07/28/16 0844  BP: (!) 149/90 (!) 152/95  Pulse: (!) 104   Resp: 16   Temp: 97.6 F (36.4 C)   TempSrc: Oral   SpO2: 96%   Weight: 156 lb 4.8 oz (70.9 kg)   Height: 5\' 5"  (1.651 m)     GENERAL: The patient is a well-nourished female, in no acute distress. The vital signs are documented above. CARDIAC: There is a regular rate and rhythm.  VASCULAR: Excellent thrill within fistula.  All incisions are healed PULMONARY: There is good air exchange bilaterally without wheezing or rales. MUSCULOSKELETAL: There are no major deformities or cyanosis. NEUROLOGIC: No focal weakness or paresthesias are detected. SKIN: There are no ulcers or rashes noted. PSYCHIATRIC: The patient has a normal affect.  DATA:  None  MEDICAL  ISSUES: Status post right basilic vein fistula creation.  Her fistula is ready for use and can be cannulated today.    [name], MD [facility] [phone] Pager [phone]

## 2016-09-17 ENCOUNTER — Emergency Department (HOSPITAL_COMMUNITY): Payer: Managed Care, Other (non HMO)

## 2016-09-17 ENCOUNTER — Encounter (HOSPITAL_COMMUNITY): Payer: Self-pay | Admitting: Emergency Medicine

## 2016-09-17 ENCOUNTER — Inpatient Hospital Stay (HOSPITAL_COMMUNITY): Payer: Managed Care, Other (non HMO)

## 2016-09-17 ENCOUNTER — Inpatient Hospital Stay (HOSPITAL_COMMUNITY)
Admission: EM | Admit: 2016-09-17 | Discharge: 2016-10-03 | DRG: 870 | Disposition: E | Payer: Managed Care, Other (non HMO) | Attending: Pulmonary Disease | Admitting: Pulmonary Disease

## 2016-09-17 DIAGNOSIS — Z6826 Body mass index (BMI) 26.0-26.9, adult: Secondary | ICD-10-CM

## 2016-09-17 DIAGNOSIS — G9340 Encephalopathy, unspecified: Secondary | ICD-10-CM | POA: Diagnosis present

## 2016-09-17 DIAGNOSIS — D61818 Other pancytopenia: Secondary | ICD-10-CM | POA: Diagnosis present

## 2016-09-17 DIAGNOSIS — J9601 Acute respiratory failure with hypoxia: Secondary | ICD-10-CM | POA: Diagnosis not present

## 2016-09-17 DIAGNOSIS — Z9911 Dependence on respirator [ventilator] status: Secondary | ICD-10-CM

## 2016-09-17 DIAGNOSIS — Z66 Do not resuscitate: Secondary | ICD-10-CM | POA: Diagnosis not present

## 2016-09-17 DIAGNOSIS — B9562 Methicillin resistant Staphylococcus aureus infection as the cause of diseases classified elsewhere: Secondary | ICD-10-CM | POA: Diagnosis not present

## 2016-09-17 DIAGNOSIS — R578 Other shock: Secondary | ICD-10-CM | POA: Diagnosis present

## 2016-09-17 DIAGNOSIS — N186 End stage renal disease: Secondary | ICD-10-CM | POA: Diagnosis present

## 2016-09-17 DIAGNOSIS — J810 Acute pulmonary edema: Secondary | ICD-10-CM | POA: Diagnosis not present

## 2016-09-17 DIAGNOSIS — I5033 Acute on chronic diastolic (congestive) heart failure: Secondary | ICD-10-CM | POA: Diagnosis present

## 2016-09-17 DIAGNOSIS — R6521 Severe sepsis with septic shock: Secondary | ICD-10-CM | POA: Diagnosis present

## 2016-09-17 DIAGNOSIS — R748 Abnormal levels of other serum enzymes: Secondary | ICD-10-CM | POA: Diagnosis not present

## 2016-09-17 DIAGNOSIS — I248 Other forms of acute ischemic heart disease: Secondary | ICD-10-CM | POA: Diagnosis present

## 2016-09-17 DIAGNOSIS — E785 Hyperlipidemia, unspecified: Secondary | ICD-10-CM | POA: Diagnosis present

## 2016-09-17 DIAGNOSIS — I132 Hypertensive heart and chronic kidney disease with heart failure and with stage 5 chronic kidney disease, or end stage renal disease: Secondary | ICD-10-CM | POA: Diagnosis present

## 2016-09-17 DIAGNOSIS — D631 Anemia in chronic kidney disease: Secondary | ICD-10-CM | POA: Diagnosis present

## 2016-09-17 DIAGNOSIS — Z992 Dependence on renal dialysis: Secondary | ICD-10-CM | POA: Diagnosis not present

## 2016-09-17 DIAGNOSIS — I351 Nonrheumatic aortic (valve) insufficiency: Secondary | ICD-10-CM | POA: Diagnosis not present

## 2016-09-17 DIAGNOSIS — K7200 Acute and subacute hepatic failure without coma: Secondary | ICD-10-CM | POA: Diagnosis present

## 2016-09-17 DIAGNOSIS — R0602 Shortness of breath: Secondary | ICD-10-CM | POA: Diagnosis present

## 2016-09-17 DIAGNOSIS — I340 Nonrheumatic mitral (valve) insufficiency: Secondary | ICD-10-CM | POA: Diagnosis not present

## 2016-09-17 DIAGNOSIS — J9622 Acute and chronic respiratory failure with hypercapnia: Secondary | ICD-10-CM | POA: Diagnosis present

## 2016-09-17 DIAGNOSIS — J9600 Acute respiratory failure, unspecified whether with hypoxia or hypercapnia: Secondary | ICD-10-CM | POA: Diagnosis not present

## 2016-09-17 DIAGNOSIS — I330 Acute and subacute infective endocarditis: Secondary | ICD-10-CM | POA: Diagnosis present

## 2016-09-17 DIAGNOSIS — Z452 Encounter for adjustment and management of vascular access device: Secondary | ICD-10-CM

## 2016-09-17 DIAGNOSIS — I2510 Atherosclerotic heart disease of native coronary artery without angina pectoris: Secondary | ICD-10-CM | POA: Diagnosis present

## 2016-09-17 DIAGNOSIS — R9431 Abnormal electrocardiogram [ECG] [EKG]: Secondary | ICD-10-CM | POA: Diagnosis not present

## 2016-09-17 DIAGNOSIS — E1122 Type 2 diabetes mellitus with diabetic chronic kidney disease: Secondary | ICD-10-CM | POA: Diagnosis not present

## 2016-09-17 DIAGNOSIS — I69354 Hemiplegia and hemiparesis following cerebral infarction affecting left non-dominant side: Secondary | ICD-10-CM | POA: Diagnosis not present

## 2016-09-17 DIAGNOSIS — R778 Other specified abnormalities of plasma proteins: Secondary | ICD-10-CM | POA: Diagnosis present

## 2016-09-17 DIAGNOSIS — J9621 Acute and chronic respiratory failure with hypoxia: Secondary | ICD-10-CM | POA: Diagnosis present

## 2016-09-17 DIAGNOSIS — R000 Tachycardia, unspecified: Secondary | ICD-10-CM | POA: Diagnosis present

## 2016-09-17 DIAGNOSIS — B957 Other staphylococcus as the cause of diseases classified elsewhere: Secondary | ICD-10-CM | POA: Diagnosis present

## 2016-09-17 DIAGNOSIS — I339 Acute and subacute endocarditis, unspecified: Secondary | ICD-10-CM

## 2016-09-17 DIAGNOSIS — Z86010 Personal history of colonic polyps: Secondary | ICD-10-CM | POA: Diagnosis not present

## 2016-09-17 DIAGNOSIS — Z833 Family history of diabetes mellitus: Secondary | ICD-10-CM | POA: Diagnosis not present

## 2016-09-17 DIAGNOSIS — J189 Pneumonia, unspecified organism: Secondary | ICD-10-CM

## 2016-09-17 DIAGNOSIS — Z978 Presence of other specified devices: Secondary | ICD-10-CM

## 2016-09-17 DIAGNOSIS — I69391 Dysphagia following cerebral infarction: Secondary | ICD-10-CM

## 2016-09-17 DIAGNOSIS — I252 Old myocardial infarction: Secondary | ICD-10-CM | POA: Diagnosis not present

## 2016-09-17 DIAGNOSIS — B9689 Other specified bacterial agents as the cause of diseases classified elsewhere: Secondary | ICD-10-CM | POA: Diagnosis present

## 2016-09-17 DIAGNOSIS — N179 Acute kidney failure, unspecified: Secondary | ICD-10-CM | POA: Diagnosis not present

## 2016-09-17 DIAGNOSIS — A419 Sepsis, unspecified organism: Secondary | ICD-10-CM | POA: Diagnosis not present

## 2016-09-17 DIAGNOSIS — R10819 Abdominal tenderness, unspecified site: Secondary | ICD-10-CM

## 2016-09-17 DIAGNOSIS — E119 Type 2 diabetes mellitus without complications: Secondary | ICD-10-CM

## 2016-09-17 DIAGNOSIS — Z823 Family history of stroke: Secondary | ICD-10-CM

## 2016-09-17 DIAGNOSIS — Z87891 Personal history of nicotine dependence: Secondary | ICD-10-CM

## 2016-09-17 DIAGNOSIS — R140 Abdominal distension (gaseous): Secondary | ICD-10-CM

## 2016-09-17 DIAGNOSIS — E872 Acidosis: Secondary | ICD-10-CM | POA: Diagnosis present

## 2016-09-17 DIAGNOSIS — M3214 Glomerular disease in systemic lupus erythematosus: Secondary | ICD-10-CM | POA: Diagnosis present

## 2016-09-17 DIAGNOSIS — R7881 Bacteremia: Secondary | ICD-10-CM | POA: Diagnosis not present

## 2016-09-17 DIAGNOSIS — Z8249 Family history of ischemic heart disease and other diseases of the circulatory system: Secondary | ICD-10-CM

## 2016-09-17 DIAGNOSIS — R509 Fever, unspecified: Secondary | ICD-10-CM | POA: Diagnosis present

## 2016-09-17 DIAGNOSIS — I959 Hypotension, unspecified: Secondary | ICD-10-CM | POA: Diagnosis present

## 2016-09-17 DIAGNOSIS — R7989 Other specified abnormal findings of blood chemistry: Secondary | ICD-10-CM

## 2016-09-17 DIAGNOSIS — E669 Obesity, unspecified: Secondary | ICD-10-CM | POA: Diagnosis present

## 2016-09-17 DIAGNOSIS — J9690 Respiratory failure, unspecified, unspecified whether with hypoxia or hypercapnia: Secondary | ICD-10-CM

## 2016-09-17 DIAGNOSIS — I4581 Long QT syndrome: Secondary | ICD-10-CM | POA: Diagnosis present

## 2016-09-17 DIAGNOSIS — J9602 Acute respiratory failure with hypercapnia: Secondary | ICD-10-CM | POA: Diagnosis not present

## 2016-09-17 DIAGNOSIS — R109 Unspecified abdominal pain: Secondary | ICD-10-CM

## 2016-09-17 HISTORY — DX: Acute and subacute endocarditis, unspecified: I33.9

## 2016-09-17 HISTORY — DX: End stage renal disease: N18.6

## 2016-09-17 HISTORY — DX: Glomerular disease in systemic lupus erythematosus: M32.14

## 2016-09-17 HISTORY — DX: Nonrheumatic mitral (valve) insufficiency: I34.0

## 2016-09-17 LAB — CBC WITH DIFFERENTIAL/PLATELET
BASOS PCT: 0 %
Basophils Absolute: 0 10*3/uL (ref 0.0–0.1)
Eosinophils Absolute: 0 10*3/uL (ref 0.0–0.7)
Eosinophils Relative: 1 %
HEMATOCRIT: 37.6 % (ref 36.0–46.0)
HEMOGLOBIN: 11.9 g/dL — AB (ref 12.0–15.0)
Lymphocytes Relative: 29 %
Lymphs Abs: 0.7 10*3/uL (ref 0.7–4.0)
MCH: 27 pg (ref 26.0–34.0)
MCHC: 31.6 g/dL (ref 30.0–36.0)
MCV: 85.5 fL (ref 78.0–100.0)
MONOS PCT: 7 %
Monocytes Absolute: 0.2 10*3/uL (ref 0.1–1.0)
NEUTROS PCT: 63 %
Neutro Abs: 1.5 10*3/uL — ABNORMAL LOW (ref 1.7–7.7)
Platelets: 86 10*3/uL — ABNORMAL LOW (ref 150–400)
RBC: 4.4 MIL/uL (ref 3.87–5.11)
RDW: 17 % — ABNORMAL HIGH (ref 11.5–15.5)
WBC: 2.3 10*3/uL — ABNORMAL LOW (ref 4.0–10.5)

## 2016-09-17 LAB — COMPREHENSIVE METABOLIC PANEL
ALK PHOS: 55 U/L (ref 38–126)
ALT: 9 U/L — ABNORMAL LOW (ref 14–54)
ANION GAP: 13 (ref 5–15)
AST: 20 U/L (ref 15–41)
Albumin: 2.4 g/dL — ABNORMAL LOW (ref 3.5–5.0)
BUN: 28 mg/dL — ABNORMAL HIGH (ref 6–20)
CALCIUM: 8.6 mg/dL — AB (ref 8.9–10.3)
CHLORIDE: 96 mmol/L — AB (ref 101–111)
CO2: 27 mmol/L (ref 22–32)
Creatinine, Ser: 4.38 mg/dL — ABNORMAL HIGH (ref 0.44–1.00)
GFR calc non Af Amer: 11 mL/min — ABNORMAL LOW (ref >60)
GFR, EST AFRICAN AMERICAN: 12 mL/min — AB (ref >60)
Glucose, Bld: 110 mg/dL — ABNORMAL HIGH (ref 65–99)
POTASSIUM: 3.7 mmol/L (ref 3.5–5.1)
SODIUM: 136 mmol/L (ref 135–145)
Total Bilirubin: 0.5 mg/dL (ref 0.3–1.2)
Total Protein: 6 g/dL — ABNORMAL LOW (ref 6.5–8.1)

## 2016-09-17 LAB — I-STAT CG4 LACTIC ACID, ED: LACTIC ACID, VENOUS: 1.42 mmol/L (ref 0.5–1.9)

## 2016-09-17 MED ORDER — PIPERACILLIN-TAZOBACTAM 3.375 G IVPB 30 MIN
3.3750 g | Freq: Once | INTRAVENOUS | Status: AC
  Administered 2016-09-17: 3.375 g via INTRAVENOUS
  Filled 2016-09-17: qty 50

## 2016-09-17 MED ORDER — ACETAMINOPHEN 325 MG PO TABS
650.0000 mg | ORAL_TABLET | Freq: Once | ORAL | Status: AC
  Administered 2016-09-17: 650 mg via ORAL
  Filled 2016-09-17: qty 2

## 2016-09-17 MED ORDER — VANCOMYCIN HCL 10 G IV SOLR
1500.0000 mg | Freq: Once | INTRAVENOUS | Status: AC
  Administered 2016-09-17: 1500 mg via INTRAVENOUS
  Filled 2016-09-17: qty 1500

## 2016-09-17 MED ORDER — SODIUM CHLORIDE 0.9 % IV BOLUS (SEPSIS)
1000.0000 mL | Freq: Once | INTRAVENOUS | Status: AC
  Administered 2016-09-17: 1000 mL via INTRAVENOUS

## 2016-09-17 MED ORDER — PIPERACILLIN-TAZOBACTAM 3.375 G IVPB
3.3750 g | Freq: Two times a day (BID) | INTRAVENOUS | Status: DC
  Administered 2016-09-18: 3.375 g via INTRAVENOUS
  Filled 2016-09-17 (×2): qty 50

## 2016-09-17 NOTE — H&P (Signed)
History and Physical    [name] [name] [id number] DOB: 1961-10-19 DOA: 09/19/2016  PCP: Lamar Blinks, MD Consultants:  [place] - Nephrology Patient coming from: home - lives with 2 sons; NOK: daughter, [phone]  Chief Complaint: SOB  HPI: [name] [name] is a 55 y.o. 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   She was given oxygen and then the SOB resolved.  Her relatives thought her face was swelling after HD and so they called 911.  She felt well at that point.  Denies fever.  No cough.  SOB has resolved.  Left hand itching - this is her only complaint at this time.  She does make urine, no urinary symptoms.     ED Course: Per Dr. Zenia Resides: Patient states her blood pressures normally low. I discussed the need for possible IV fluids given the fact that she does any sepsis criteria and she would like to not receive the full bolus as it could cause her to become more fluid overloaded and car her to be placed mechanical ventilation. Patient given IV antibiotics for suspected pneumonia. Heart rate has improved with antipyretics. Discussed with hospitalist will omit to stepdown   Review of Systems: As per HPI; otherwise 10 point review of systems reviewed and negative.   Ambulatory Status: ambulates with cane  Past Medical History:  Diagnosis Date  . Anemia   . Anxiety   . Arthritis    RA  . Blood transfusion without reported diagnosis   . Chronic ankle pain    due to RA?  Marland Kitchen Colitis    ? per hospital notes 11/2014  . Diabetes mellitus (HCC)    border line  . ESRD (end-stage renal disease) due to SLE ([place])    on HD  . Hemiparesis and alteration of sensations as late effects of stroke ([place]) 06/19/2016  . Hemorrhoids   . Hyperlipidemia   . Hyperplastic colon polyp   . Hypertension   . Hyponatremia   . Lower GI bleed   . Lupus    . 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 Metabolic acidosis   . Metatarsal bone fracture right   4th  . Myocardial infarction   . Pain in joints   . Paresthesias   . Pneumonia   . Stenosis of cervical spine region    with HNP at C5/6, C6/7  . Stroke [facility]) 11/2014   left sided weakness, dysphagia    Past Surgical History:  Procedure Laterality Date  . AV FISTULA PLACEMENT Right 12/21/2015   Procedure: ARTERIOVENOUS (AV) FISTULA CREATION RIGHT ARM;  Surgeon: [name], MD;  Location: [place];  Service: Vascular;  Laterality: Right;  . BASCILIC VEIN TRANSPOSITION Left 02/14/2015   Procedure: BASILIC VEIN Fistula Creation First Stage;  Surgeon: Rosetta Posner, MD;  Location: [place];  Service: Vascular;  Laterality: Left;  . BASCILIC VEIN TRANSPOSITION Left 03/28/2015   Procedure: LEFT ARM SECOND STAGE BASCILIC VEIN TRANSPOSITION;  Surgeon: Rosetta Posner, MD;  Location: [place];  Service: Vascular;  Laterality: Left;  . BASCILIC VEIN TRANSPOSITION Right 04/17/2016   Procedure: RIGHT SECOND STAGE BASILIC VEIN TRANSPOSITION;  Surgeon: [name], MD;  Location: [place];  Service: Vascular;  Laterality: Right;  . BREAST BIOPSY    . COLONOSCOPY W/ BIOPSIES AND POLYPECTOMY    .  INSERTION OF DIALYSIS CATHETER Right 02/05/2015   Procedure: INSERTION OF DIALYSIS CATHETER - RIGHT INTERNAL JUGULAR;  Surgeon: Conrad Alleman, MD;  Location: [place];  Service: Vascular;  Laterality: Right;  . INSERTION OF DIALYSIS CATHETER N/A 10/05/2015   Procedure: INSERTION OF DIALYSIS CATHETER Right Internal Jugular.;  Surgeon: Mal Misty, MD;  Location: [place];  Service: Vascular;  Laterality: N/A;  . INSERTION OF DIALYSIS CATHETER Left 12/21/2015   Procedure: INSERTION OF DIALYSIS CATHETER LEFT INTERNAL JUGULAR VEIN;  Surgeon: [name], MD;  Location: [place];  Service: Vascular;  Laterality: Left;  . IR GENERIC HISTORICAL Right 06/12/2016   IR DIALY SHUNT INTRO NEEDLE/INTRACATH INITIAL W/IMG RIGHT  06/12/2016 Markus Daft, MD [facility]-INTERV RAD  . LIGATION OF ARTERIOVENOUS  FISTULA Left 12/21/2015   Procedure: LIGATION OF ARTERIOVENOUS  FISTULA LEFT ARM;  Surgeon: [name], MD;  Location: [place];  Service: Vascular;  Laterality: Left;  . PERIPHERAL VASCULAR CATHETERIZATION N/A 10/10/2015   Procedure: Fistulagram;  Surgeon: [name], MD;  Location: [place] [facility];  Service: Cardiovascular;  Laterality: N/A;  . PERIPHERAL VASCULAR CATHETERIZATION N/A 03/18/2016   Procedure: Aortic Arch Angiography;  Surgeon: [name], MD;  Location: [place] CV [facility];  Service: Cardiovascular;  Laterality: N/A;  . PERIPHERAL VASCULAR CATHETERIZATION Left 03/18/2016   Procedure: Peripheral Vascular Balloon Angioplasty;  Surgeon: [name], MD;  Location: [place] CV [facility];  Service: Cardiovascular;  Laterality: Left;  subclavian  . REVISON OF ARTERIOVENOUS FISTULA Left 09/26/2015   Procedure: Revision Left Basilic Vein Transposition with Banding using Gortex graft;  Surgeon: Mal Misty, MD;  Location: [place];  Service: Vascular;  Laterality: Left;  . TUBAL LIGATION      Social History   Social History  . Marital status: Single    Spouse name: N/A  . Number of children: 3  . Years of education: 2 yrs college   Occupational History  . disabled    Social History Main Topics  . Smoking status: Former Smoker    Packs/day: 0.50    Years: 30.00    Types: Cigarettes    Quit date: 09/22/2014  . Smokeless tobacco: Never Used  . Alcohol use No  . Drug use: No  . Sexual activity: Not on file   Other Topics Concern  . Not on file   Social History Narrative   Lives at home w/ her 2 sons   Right-handed   Caffeine: rare    Allergies  Allergen Reactions  . Sulfa Antibiotics Itching    Family History  Problem Relation Age of Onset  . Hypertension Mother   . Diabetes Mother   . Stroke Mother   . Hypertension Sister   . Diabetes Father   . Hypertension Maternal  Grandmother   . Hypertension    . Lupus    . Rheum arthritis    . Colon cancer Neg Hx     Prior to Admission medications   Medication Sig Start Date End Date Taking? Authorizing Provider  acetaminophen (TYLENOL) 325 MG tablet Take 650 mg by mouth every 6 (six) hours as needed for moderate pain.    Yes Historical Provider, MD  ALPRAZolam (XANAX) 0.25 MG tablet Take 1 tablet (0.25 mg total) by mouth every 8 (eight) hours as needed for anxiety (0.25mg -0.5mg ). Patient taking differently: Take 0.25-0.5 mg by mouth every 8 (eight) hours as needed for anxiety.  02/22/15  Yes Lavon Paganini Angiulli, PA-C  Aspirin-Salicylamide-Caffeine (BC  FAST PAIN RELIEF) 650-195-33.3 MG PACK Take 1 Package by mouth daily as needed (pain).   Yes Historical Provider, MD  cinacalcet (SENSIPAR) 30 MG tablet Take 30 mg by mouth daily.   Yes Historical Provider, MD  LYRICA 75 MG capsule take 1 capsule by mouth twice a day 07/09/16  Yes Gay Filler Copland, MD  multivitamin (RENA-VIT) TABS tablet Take 1 tablet by mouth daily.   Yes Historical Provider, MD  nortriptyline (PAMELOR) 10 MG capsule Take one capsule at night for one week, then take 2 capsules at night for one week, then take 3 capsules at night Patient taking differently: Take 10 mg by mouth at bedtime. Take one capsule at night for one week, then take 2 capsules at night for one week, then take 3 capsules at night 06/19/16  Yes Kathrynn Ducking, MD  oxyCODONE-acetaminophen (PERCOCET/ROXICET) 5-325 MG tablet Take 1 tablet by mouth every 6 (six) hours as needed. 04/17/16  Yes Ulyses Amor, PA-C  pantoprazole (PROTONIX) 40 MG tablet Take 1 tablet (40 mg total) by mouth daily. 02/22/15  Yes Daniel J Angiulli, PA-C  pravastatin (PRAVACHOL) 20 MG tablet Take 20 mg by mouth daily.   Yes Historical Provider, MD  RENVELA 800 MG tablet Take 2,400-4,800 mg by mouth See admin instructions. Takes six tablets three times daily with each meal and three tablets with snacks. 03/27/16  Yes  Historical Provider, MD  temazepam (RESTORIL) 30 MG capsule Take 30 mg by mouth at bedtime as needed for sleep.    Yes Historical Provider, MD  thiamine 100 MG tablet Take 1 tablet (100 mg total) by mouth daily. 02/22/15  Yes Daniel J Angiulli, PA-C  HYDROcodone-acetaminophen (NORCO/VICODIN) 5-325 MG tablet Take 1 after dialysis as needed for pain Patient not taking: Reported on 09/16/2016 01/07/16   Darreld Mclean, MD  hydroxychloroquine (PLAQUENIL) 200 MG tablet Take 1 tablet (200 mg total) by mouth 2 (two) times daily. Patient not taking: Reported on 09/13/2016 07/02/15   [name], MD    Physical Exam: Vitals:   09/13/2016 2130 10/02/2016 2250 09/30/2016 2300 09/23/2016 2300  BP: (!) 84/58 (!) 78/69 (!) 87/64   Pulse: (!) 128 (!) 121 119   Resp: 25 19 15    Temp:    98.8 F (37.1 C)  TempSrc:    Oral  SpO2: 99% 95% 99%   Weight:      Height:         General: Appears calm and comfortable and is NAD Eyes:  PERRL, EOMI, normal lids, iris ENT:  grossly normal hearing, lips & tongue, mmm Neck:  no LAD, masses or thyromegaly Cardiovascular:  tachycardia, no m/r/g. No LE edema.  Respiratory:  CTA bilaterally, no w/r/r. Normal respiratory effort. Abdomen:  soft, ntnd, NABS Skin:  no rash or induration seen on limited exam Musculoskeletal:  grossly normal tone BUE/BLE, good ROM, no bony abnormality Psychiatric:  grossly normal mood and affect, speech fluent and appropriate, AOx3 Neurologic:  CN 2-12 grossly intact, moves all extremities in coordinated fashion, sensation intact  Labs on Admission: I have personally reviewed following labs and imaging studies  CBC:  Recent Labs Lab 09/03/2016 1945  WBC 2.3*  NEUTROABS 1.5*  HGB 11.9*  HCT 37.6  MCV 85.5  PLT 86*   Basic Metabolic Panel:  Recent Labs Lab 09/15/2016 1945  NA 136  K 3.7  CL 96*  CO2 27  GLUCOSE 110*  BUN 28*  CREATININE 4.38*  CALCIUM 8.6*   GFR:  Estimated Creatinine Clearance: 15.2 mL/min (by C-G  formula based on SCr of 4.38 mg/dL (H)). Liver Function Tests:  Recent Labs Lab 09/16/2016 1945  AST 20  ALT 9*  ALKPHOS 55  BILITOT 0.5  PROT 6.0*  ALBUMIN 2.4*   No results for input(s): LIPASE, AMYLASE in the last 168 hours. No results for input(s): AMMONIA in the last 168 hours. Coagulation Profile: No results for input(s): INR, PROTIME in the last 168 hours. Cardiac Enzymes: No results for input(s): CKTOTAL, CKMB, CKMBINDEX, TROPONINI in the last 168 hours. BNP (last 3 results) No results for input(s): PROBNP in the last 8760 hours. HbA1C: No results for input(s): HGBA1C in the last 72 hours. CBG: No results for input(s): GLUCAP in the last 168 hours. Lipid Profile: No results for input(s): CHOL, HDL, LDLCALC, TRIG, CHOLHDL, LDLDIRECT in the last 72 hours. Thyroid Function Tests: No results for input(s): TSH, T4TOTAL, FREET4, T3FREE, THYROIDAB in the last 72 hours. Anemia Panel: No results for input(s): VITAMINB12, FOLATE, FERRITIN, TIBC, IRON, RETICCTPCT in the last 72 hours. Urine analysis:    Component Value Date/Time   COLORURINE YELLOW 01/21/2015 1624   APPEARANCEUR CLOUDY (A) 01/21/2015 1624   LABSPEC 1.019 01/21/2015 1624   PHURINE 5.5 01/21/2015 1624   GLUCOSEU NEGATIVE 01/21/2015 1624   HGBUR SMALL (A) 01/21/2015 1624   BILIRUBINUR NEGATIVE 01/21/2015 1624   BILIRUBINUR neg 03/01/2012 1138   KETONESUR 15 (A) 01/21/2015 1624   PROTEINUR >300 (A) 01/21/2015 1624   UROBILINOGEN 0.2 01/21/2015 1624   NITRITE NEGATIVE 01/21/2015 1624   LEUKOCYTESUR NEGATIVE 01/21/2015 1624    Creatinine Clearance: Estimated Creatinine Clearance: 15.2 mL/min (by C-G formula based on SCr of 4.38 mg/dL (H)).  Sepsis Labs: @LABRCNTIP (procalcitonin:4,lacticidven:4) )No results found for this or any previous visit (from the past 240 hour(s)).   Radiological Exams on Admission: Ct Head Wo Contrast  Result Date: 10/01/2016 CLINICAL DATA:  Multiple falls over the past few  days. Does not recall if she struck her head today. EXAM: CT HEAD WITHOUT CONTRAST TECHNIQUE: Contiguous axial images were obtained from the base of the skull through the vertex without intravenous contrast. COMPARISON:  01/28/2015 FINDINGS: Brain: No evidence of acute infarction, hemorrhage, hydrocephalus, extra-axial collection or mass lesion/mass effect. There is mild generalized atrophy. There is white matter hypodensity which is chronic and likely due to small vessel ischemic disease. Vascular: No hyperdense vessel or unexpected calcification. Skull: Normal. Negative for fracture or focal lesion. Sinuses/Orbits: No acute finding. Other: None. IMPRESSION: No acute intracranial findings. There is mild generalized atrophy and chronic appearing white matter hypodensities which likely represent small vessel ischemic disease. Electronically Signed   By: Andreas Newport M.D.   On: 09/15/2016 22:37   Dg Chest Port 1 View  Result Date: 09/04/2016 CLINICAL DATA:  Shortness of breath and confusion for 2 days. EXAM: PORTABLE CHEST 1 VIEW COMPARISON:  Radiographs 01/07/2016 FINDINGS: Bilateral lobe airspace opacity and pleural effusions, left greater than right. Heart appears mildly enlarged. There is vascular congestion and possible mild central edema. No pneumothorax. Rounded calcific density in the region of the coracoclavicular ligament is unchanged from prior exams and chronic. IMPRESSION: Findings suspicious for fluid overload/ CHF with vascular congestion, pleural effusions, and possible mild central pulmonary edema. Electronically Signed   By: Jeb Levering M.D.   On: 09/15/2016 20:09    EKG: Independently reviewed.  Sinus tachycardia with rate 139; prolonged QTc 577, nonspecific ST changes with no evidence of acute ischemia  Assessment/Plan Principal Problem:  Tachycardia Active Problems:   Diabetes mellitus (HCC)   Fever   Hypotension   End stage renal disease on dialysis (HCC)   Prolonged  Q-T interval on ECG   Pancytopenia (HCC)   Tachycardia, fever -Patient with episode of SOB at HD today that resolved and has not recurred -Reports feeling well, no cough, no fever -Temp was 100.8 on arrival -No pulmonary abnl findings on PE -Has pancytopenia - ?new -While sepsis is on the ddx, this is not a certain diagnosis: normal lactate, borderline low BPs, but appears well and is without complaints -ER physician concerned about HCAP but this is low probability at this time given lack of current pulm complaints and normal PE/CXR -Uncertain etiology for tachycardia -With pancytopenia, possibly viral syndrome -Will monitor in SDU and continue broad-spectrum abx for now -Would not want to give significant IVF given minimal UOP and ESRD, so this makes her situation somewhat more challenging  Pancytopenia -Uncertain etiology - ?ivral -Will hold Lovenox, use SCDs due to thrombocytopenia -Will follow - if worsening, she may need neutropenic precautions  Prolonged QT interval -Prior in [id number] was [id number] -Uncertain etiology -Repeat EKG in AM -Avoid QT-prolonging agents  Hypotension -Will bolus as needed for hypotension but with close monitoring in SDU -Nephrology consult may be indicated tomorrow  ESRD, MWF HD -Had HD today -Nephrology prn order set utilized -May need nephrology consultation in AM  DM -Borderline, not on meds -Cover with SSI  DVT prophylaxis: SCDs Code Status:  Full - confirmed with patient Family Communication: None present Disposition Plan:  Home once clinically improved Consults called: None, may need nephrology in AM  Admission status: Admit - It is my clinical opinion that admission to INPATIENT is reasonable and necessary because this patient will require at least 2 midnights in the hospital to treat this condition based on the medical complexity of the problems presented.  Given the aforementioned information, the predictability of an adverse outcome is felt  to be significant.    Karmen Bongo MD [facility]  If 7PM-7AM, please contact night-coverage [email or web address] Password [email or web address]  09/11/2016, 11:13 PM

## 2016-09-17 NOTE — ED Triage Notes (Signed)
Pt brought in by [facility]. Pt coming from home with c/o SOB. Pt went dialysis today and dialysis stated HR was elevated. Pt denies chest pain, cough, and nausea vomiting.

## 2016-09-17 NOTE — ED Notes (Signed)
Pt BP 78/69 HR 120. Made Dr. Zenia Resides aware. Bolus ordered. Pt alert and oriented and comfortable.

## 2016-09-17 NOTE — ED Triage Notes (Signed)
Per EMS: Pt presents to ED for assessment  Of SOB at home.  Pt went to dialysis today and was told her HR was high.  HR 150 with EMS, EMS sts ST.  Pt 93% on RA, placed on 2L [place] and at 98%.  EMS sts pt warm to touch.  Right arm restricted.  Pt axo.

## 2016-09-17 NOTE — ED Notes (Signed)
Pt transported to CT ?

## 2016-09-17 NOTE — ED Notes (Signed)
Pt's family requesting update.  Dr. Zenia Resides at bedside

## 2016-09-17 NOTE — Progress Notes (Signed)
Pharmacy Antibiotic Note  [name] [name] is a 55 y.o. female admitted on 09/10/2016 with sepsis.  Pharmacy has been consulted for Zosyn and vancomycin dosing.   Day #1 of abx for sepsis. Tmax of 100.8, WBC 2.3. Patient received last HD session earlier today.  Plan: Give Zosyn 3.375g IV (30 min infusion) x 1, then start Zosyn 3.375g IV Q12 (extended infusion) Give vancomycin 1.5g IV x 1, then follow up HD schedule Monitor clinical picture, HD sessions, pre-HD VR prn F/U C&S, abx deescalation / LOT   Height: 5\' 5"  (165.1 cm) Weight: 172 lb (78 kg) IBW/kg (Calculated) : 57  Temp (24hrs), Avg:100.8 F (38.2 C), Min:100.8 F (38.2 C), Max:100.8 F (38.2 C)   Recent Labs Lab 09/03/2016 1945 10/02/2016 1956  WBC 2.3*  --   LATICACIDVEN  --  1.42    CrCl cannot be calculated (Patient's most recent lab result is older than the maximum 21 days allowed.).    Allergies  Allergen Reactions  . Sulfa Antibiotics Itching    Antimicrobials this admission: Zosyn 11/15 >>  Vancomycin 11/15 >>   Dose adjustments this admission: n/a  Microbiology results: 11/15 BCx: sent 11/15 UCx: sent    Thank you for allowing pharmacy to be a part of this patient's care.  Reginia Naas 09/09/2016 8:21 PM

## 2016-09-17 NOTE — ED Provider Notes (Addendum)
[facility] DEPT Provider Note   CSN: [id number] Arrival date & time: 09/12/2016  1900     History   Chief Complaint No chief complaint on file.   HPI [name] [name] is a 55 y.o. female.  55 year old female with history of end stage renal disease, status post dialysis today, presents with increasing shortness of breath as well as high heart rate. Patient states she was tachycardic at dialysis but she is unsure how high it was. She is unsure if she's had a fever but has had some decreased mental status. Denies any vomiting or diarrhea. No recent blood loss. No chest pain. Unsure about a cough. Makes minimal urine at this time. No headache or photophobia. Did have a fall several days ago. States was mechanical in nature. Called EMS and heart rate was 150 patient's pulse oximetry was 93% she was placed on oxygen which improved to 90%. pt felt warm to the touch by EMS and she was transported here      Past Medical History:  Diagnosis Date  . Anemia   . Anxiety   . Arthritis    RA  . Blood transfusion without reported diagnosis   . Chronic ankle pain    due to RA?  . CKD (chronic kidney disease)    due to lupus/Dr. [name]  . Colitis    ? per hospital notes 11/2014  . Diabetes mellitus (HCC)    border line  . Hemiparesis and alteration of sensations as late effects of stroke ([place]) 06/19/2016  . Hemorrhoids   . Hyperlipidemia   . Hyperplastic colon polyp   . Hypertension   . Hyponatremia   . Lower GI bleed   . Lupus   . 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 Metabolic acidosis   . Metatarsal bone fracture right   4th  . Myocardial infarction   . Pain in joints   . Paresthesias   . Pneumonia   . Stenosis of cervical spine region    with HNP at C5/6, C6/7  . Stroke [facility]) 11/2014   left sided weakness, dysphagia    Patient Active Problem List   Diagnosis Date Noted  . Paresthesia and pain of left extremity 06/19/2016  . Hemiparesis and alteration of  sensations as late effects of stroke ([place]) 06/19/2016  . Dilated bile duct   . Abnormal CT of the abdomen   . Abdominal pain 12/28/2015  . Epigastric pain 12/28/2015  . End stage renal disease on dialysis ([place]) 12/28/2015  . Lupus 12/28/2015  . GERD (gastroesophageal reflux disease) 12/28/2015  . Hyperlipidemia 12/28/2015  . Prolonged Q-T interval on ECG 12/28/2015  . Steal syndrome as complication of dialysis access ([place]) 09/21/2015  . History of CVA (cerebrovascular accident) 02/20/2015  . Debilitated 02/15/2015  . Acute respiratory failure ([place])   . SVT (supraventricular tachycardia) ([place])   . Acute diastolic CHF (congestive heart failure) ([place])   . Encephalopathy acute   . Atelectasis, left   . Confusion   . ARDS (adult respiratory distress syndrome) ([place]) 01/22/2015  . Tachycardia   . Weakness 01/19/2015  . Elevated troponin level 01/19/2015  . Hypotension 01/19/2015  . Shortness of breath 01/18/2015  . Hemorrhoid 12/07/2014  . HCAP (healthcare-associated pneumonia) 11/23/2014  . Fever   . Bleeding gastrointestinal   . Tremor 11/21/2014  . Protein-calorie malnutrition, severe ([place]) 11/20/2014  . Noninfectious gastroenteritis and colitis 11/19/2014  . Acute blood loss anemia   . Hyponatremia   . 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 Secondary cardiomyopathy ([place])   . Noncompliance with medication regimen   . Tobacco abuse   . Sepsis ([place]) 10/10/2014  . Leucocytosis 10/10/2014  . Fluid overload 10/10/2014  . SIRS (systemic inflammatory response syndrome) ([place]) 10/10/2014  . Anemia 10/10/2014  . Hyperkalemia 10/10/2014  . Acute encephalopathy 10/10/2014  . Torticollis, acquired 10/06/2014  . Essential hypertension 10/05/2014  . Acute respiratory acidosis   . ICH (intracerebral hemorrhage) ([place])   . Chronic ankle pain   . Cerebral hemorrhage ([place])   . 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 Hypertensive emergency 09/28/2014  . Iron deficiency anemia  02/18/2008  . Mononeuritis of lower limb 02/18/2008  . History of adrenal insufficiency 02/18/2008  . DIABETES MELLITUS, TYPE II 10/25/2007  . HYPERLIPIDEMIA 10/25/2007  . Systemic lupus erythematosus ([place]) 10/25/2007  . Rheumatoid arthritis ([place]) 10/25/2007  . COLONIC POLYPS, HYPERPLASTIC 04/08/2006    Past Surgical History:  Procedure Laterality Date  . AV FISTULA PLACEMENT Right 12/21/2015   Procedure: ARTERIOVENOUS (AV) FISTULA CREATION RIGHT ARM;  Surgeon: [name], MD;  Location: [place];  Service: Vascular;  Laterality: Right;  . BASCILIC VEIN TRANSPOSITION Left 02/14/2015   Procedure: BASILIC VEIN Fistula Creation First Stage;  Surgeon: Rosetta Posner, MD;  Location: [facility];  Service: Vascular;  Laterality: Left;  . BASCILIC VEIN TRANSPOSITION Left 03/28/2015   Procedure: LEFT ARM SECOND STAGE BASCILIC VEIN TRANSPOSITION;  Surgeon: Rosetta Posner, MD;  Location: [place];  Service: Vascular;  Laterality: Left;  . BASCILIC VEIN TRANSPOSITION Right 04/17/2016   Procedure: RIGHT SECOND STAGE BASILIC VEIN TRANSPOSITION;  Surgeon: [name], MD;  Location: [place];  Service: Vascular;  Laterality: Right;  . BREAST BIOPSY    . COLONOSCOPY W/ BIOPSIES AND POLYPECTOMY    . INSERTION OF DIALYSIS CATHETER Right 02/05/2015   Procedure: INSERTION OF DIALYSIS CATHETER - RIGHT INTERNAL JUGULAR;  Surgeon: Conrad Plain, MD;  Location: [facility];  Service: Vascular;  Laterality: Right;  . INSERTION OF DIALYSIS CATHETER N/A 10/05/2015   Procedure: INSERTION OF DIALYSIS CATHETER Right Internal Jugular.;  Surgeon: Mal Misty, MD;  Location: [place];  Service: Vascular;  Laterality: N/A;  . INSERTION OF DIALYSIS CATHETER Left 12/21/2015   Procedure: INSERTION OF DIALYSIS CATHETER LEFT INTERNAL JUGULAR VEIN;  Surgeon: [name], MD;  Location: [facility];  Service: Vascular;  Laterality: Left;  . IR GENERIC HISTORICAL Right 06/12/2016   IR DIALY SHUNT INTRO NEEDLE/INTRACATH INITIAL W/IMG RIGHT 06/12/2016 Markus Daft, MD [facility]-INTERV RAD  . LIGATION OF ARTERIOVENOUS  FISTULA Left 12/21/2015   Procedure: LIGATION OF ARTERIOVENOUS  FISTULA LEFT ARM;  Surgeon: [name], MD;  Location: [facility];  Service: Vascular;  Laterality: Left;  . PERIPHERAL VASCULAR CATHETERIZATION N/A 10/10/2015   Procedure: Fistulagram;  Surgeon: [name], MD;  Location: [place] CV LAB;  Service: Cardiovascular;  Laterality: N/A;  . PERIPHERAL VASCULAR CATHETERIZATION N/A 03/18/2016   Procedure: Aortic Arch Angiography;  Surgeon: [name], MD;  Location: [place] CV LAB;  Service: Cardiovascular;  Laterality: N/A;  . PERIPHERAL VASCULAR CATHETERIZATION Left 03/18/2016   Procedure: Peripheral Vascular Balloon Angioplasty;  Surgeon: [name], MD;  Location: [place] CV LAB;  Service: Cardiovascular;  Laterality: Left;  subclavian  . REVISON OF ARTERIOVENOUS FISTULA Left 09/26/2015   Procedure: Revision Left Basilic Vein Transposition with Banding using Gortex graft;  Surgeon: Mal Misty, MD;  Location: [place];  Service:  Vascular;  Laterality: Left;  . TUBAL LIGATION      OB History    No data available       Home Medications    Prior to Admission medications   Medication Sig Start Date End Date Taking? Authorizing Provider  ALPRAZolam (XANAX) 0.25 MG tablet Take 1 tablet (0.25 mg total) by mouth every 8 (eight) hours as needed for anxiety (0.25mg -0.5mg ). Patient taking differently: Take 0.25-0.5 mg by mouth every 8 (eight) hours as needed for anxiety.  02/22/15   Lavon Paganini Angiulli, PA-C  cinacalcet (SENSIPAR) 30 MG tablet Take 30 mg by mouth daily.    Historical Provider, MD  docusate sodium (COLACE) 50 MG capsule Take 50 mg by mouth daily as needed for mild constipation. Reported on 12/12/2015    Historical Provider, MD  HYDROcodone-acetaminophen (NORCO/VICODIN) 5-325 MG tablet Take 1 after dialysis as needed for pain Patient not taking: Reported on 07/28/2016 01/07/16   Darreld Mclean, MD    hydroxychloroquine (PLAQUENIL) 200 MG tablet Take 1 tablet (200 mg total) by mouth 2 (two) times daily. Patient taking differently: Take 200-400 mg by mouth daily. Alternates taking 200mg  and 400mg  each day. 07/02/15   [name], MD  lidocaine-prilocaine (EMLA) cream Apply 1 application topically as needed (For port-a-cath.).     Historical Provider, MD  LYRICA 75 MG capsule take 1 capsule by mouth twice a day 07/09/16   Darreld Mclean, MD  multivitamin (RENA-VIT) TABS tablet Take 1 tablet by mouth daily.    Historical Provider, MD  nortriptyline (PAMELOR) 10 MG capsule Take one capsule at night for one week, then take 2 capsules at night for one week, then take 3 capsules at night 06/19/16   Kathrynn Ducking, MD  oxyCODONE-acetaminophen (PERCOCET/ROXICET) 5-325 MG tablet Take 1 tablet by mouth every 6 (six) hours as needed. Patient not taking: Reported on 07/28/2016 04/17/16   Ulyses Amor, PA-C  pantoprazole (PROTONIX) 40 MG tablet Take 1 tablet (40 mg total) by mouth daily. 02/22/15   Lavon Paganini Angiulli, PA-C  pravastatin (PRAVACHOL) 20 MG tablet Take 20 mg by mouth daily.    Historical Provider, MD  RENVELA 800 MG tablet Take 2,400-4,800 mg by mouth See admin instructions. Takes six tablets three times daily with each meal and three tablets with snacks. 03/27/16   Historical Provider, MD  temazepam (RESTORIL) 30 MG capsule Take 30 mg by mouth at bedtime as needed for sleep.     Historical Provider, MD  thiamine 100 MG tablet Take 1 tablet (100 mg total) by mouth daily. 02/22/15   Lavon Paganini Angiulli, PA-C    Family History Family History  Problem Relation Age of Onset  . Hypertension Mother   . Diabetes Mother   . Stroke Mother   . Hypertension Sister   . Diabetes Father   . Hypertension Maternal Grandmother   . Hypertension    . Lupus    . Rheum arthritis    . Colon cancer Neg Hx     Social History Social History  Substance Use Topics  . Smoking status: Former Smoker     Packs/day: 0.50    Years: 30.00    Types: Cigarettes    Quit date: 09/22/2014  . Smokeless tobacco: Never Used  . Alcohol use 0.0 oz/week     Comment: Rare - red wine     Allergies   Sulfa antibiotics   Review of Systems Review of Systems  All other systems reviewed and are  negative.    Physical Exam Updated Vital Signs LMP 07/18/2012   Physical Exam  Constitutional: She is oriented to person, place, and time. She appears well-developed and well-nourished.  Non-toxic appearance. No distress.  HENT:  Head: Normocephalic and atraumatic.  Eyes: Conjunctivae, EOM and lids are normal. Pupils are equal, round, and reactive to light.  Neck: Normal range of motion. Neck supple. No tracheal deviation present. No thyroid mass present.  Cardiovascular: Regular rhythm and normal heart sounds.  Tachycardia present.  Exam reveals no gallop.   No murmur heard. Pulmonary/Chest: Effort normal. No stridor. No respiratory distress. She has decreased breath sounds. She has no wheezes. She has no rhonchi. She has no rales.  Abdominal: Soft. Normal appearance and bowel sounds are normal. She exhibits no distension. There is no tenderness. There is no rebound and no CVA tenderness.  Musculoskeletal: Normal range of motion. She exhibits no edema or tenderness.  Neurological: She is alert and oriented to person, place, and time. She has normal strength. No cranial nerve deficit or sensory deficit. GCS eye subscore is 4. GCS verbal subscore is 5. GCS motor subscore is 6.  Skin: Skin is warm and dry. No abrasion and no rash noted.  Psychiatric: She has a normal mood and affect. Her speech is normal and behavior is normal.  Nursing note and vitals reviewed.    ED Treatments / Results  Labs (all labs ordered are listed, but only abnormal results are displayed) Labs Reviewed - No data to display  EKG  EKG Interpretation None       Radiology No results found.  Procedures Procedures  (including critical care time)  Medications Ordered in ED Medications - No data to display   Initial Impression / Assessment and Plan / ED Course  I have reviewed the triage vital signs and the nursing notes.  Pertinent labs & imaging results that were available during my care of the patient were reviewed by me and considered in my medical decision making (see chart for details).  Clinical Course     Patient states her blood pressures normally low. I discussed the need for possible IV fluids given the fact that she does any sepsis criteria and she would like to not receive the full bolus as it could cause her to become more fluid overloaded and car her to be placed mechanical ventilation. Patient given IV antibiotics for suspected pneumonia. Heart rate has improved with antipyretics. Discussed with hospitalist will omit to stepdown   CRITICAL CARE Performed by: Leota Jacobsen Total critical care time: 50 minutes Critical care time was exclusive of separately billable procedures and treating other patients. Critical care was necessary to treat or prevent imminent or life-threatening deterioration. Critical care was time spent personally by me on the following activities: development of treatment plan with patient and/or surrogate as well as nursing, discussions with consultants, evaluation of patient's response to treatment, examination of patient, obtaining history from patient or surrogate, ordering and performing treatments and interventions, ordering and review of laboratory studies, ordering and review of radiographic studies, pulse oximetry and re-evaluation of patient's condition.   Final Clinical Impressions(s) / ED Diagnoses   Final diagnoses:  None    New Prescriptions New Prescriptions   No medications on file     Lacretia Leigh, MD 09/12/2016 [id number]    Lacretia Leigh, MD 09/04/2016 2150

## 2016-09-18 ENCOUNTER — Inpatient Hospital Stay (HOSPITAL_COMMUNITY): Payer: Managed Care, Other (non HMO)

## 2016-09-18 ENCOUNTER — Encounter (HOSPITAL_COMMUNITY): Payer: Self-pay | Admitting: General Practice

## 2016-09-18 DIAGNOSIS — J9601 Acute respiratory failure with hypoxia: Secondary | ICD-10-CM

## 2016-09-18 DIAGNOSIS — D649 Anemia, unspecified: Secondary | ICD-10-CM

## 2016-09-18 DIAGNOSIS — J810 Acute pulmonary edema: Secondary | ICD-10-CM

## 2016-09-18 DIAGNOSIS — R000 Tachycardia, unspecified: Secondary | ICD-10-CM

## 2016-09-18 DIAGNOSIS — J9621 Acute and chronic respiratory failure with hypoxia: Secondary | ICD-10-CM

## 2016-09-18 DIAGNOSIS — E1122 Type 2 diabetes mellitus with diabetic chronic kidney disease: Secondary | ICD-10-CM

## 2016-09-18 DIAGNOSIS — R9431 Abnormal electrocardiogram [ECG] [EKG]: Secondary | ICD-10-CM

## 2016-09-18 LAB — BASIC METABOLIC PANEL
ANION GAP: 8 (ref 5–15)
BUN: 33 mg/dL — ABNORMAL HIGH (ref 6–20)
CHLORIDE: 101 mmol/L (ref 101–111)
CO2: 30 mmol/L (ref 22–32)
CREATININE: 5.12 mg/dL — AB (ref 0.44–1.00)
Calcium: 8.7 mg/dL — ABNORMAL LOW (ref 8.9–10.3)
GFR calc non Af Amer: 9 mL/min — ABNORMAL LOW (ref >60)
GFR, EST AFRICAN AMERICAN: 10 mL/min — AB (ref >60)
Glucose, Bld: 89 mg/dL (ref 65–99)
POTASSIUM: 4 mmol/L (ref 3.5–5.1)
SODIUM: 139 mmol/L (ref 135–145)

## 2016-09-18 LAB — GLUCOSE, CAPILLARY
GLUCOSE-CAPILLARY: 131 mg/dL — AB (ref 65–99)
Glucose-Capillary: 86 mg/dL (ref 65–99)
Glucose-Capillary: 116 mg/dL — ABNORMAL HIGH (ref 65–99)

## 2016-09-18 LAB — BLOOD CULTURE ID PANEL (REFLEXED)
Acinetobacter baumannii: NOT DETECTED
CANDIDA ALBICANS: NOT DETECTED
CANDIDA GLABRATA: NOT DETECTED
CANDIDA KRUSEI: NOT DETECTED
CANDIDA TROPICALIS: NOT DETECTED
Candida parapsilosis: NOT DETECTED
ENTEROBACTER CLOACAE COMPLEX: NOT DETECTED
ENTEROBACTERIACEAE SPECIES: NOT DETECTED
ESCHERICHIA COLI: NOT DETECTED
Enterococcus species: NOT DETECTED
Haemophilus influenzae: NOT DETECTED
KLEBSIELLA PNEUMONIAE: NOT DETECTED
Klebsiella oxytoca: NOT DETECTED
Listeria monocytogenes: NOT DETECTED
Methicillin resistance: DETECTED — AB
NEISSERIA MENINGITIDIS: NOT DETECTED
PROTEUS SPECIES: NOT DETECTED
Pseudomonas aeruginosa: NOT DETECTED
STAPHYLOCOCCUS SPECIES: DETECTED — AB
STREPTOCOCCUS AGALACTIAE: NOT DETECTED
STREPTOCOCCUS PYOGENES: NOT DETECTED
STREPTOCOCCUS SPECIES: NOT DETECTED
Serratia marcescens: NOT DETECTED
Staphylococcus aureus (BCID): NOT DETECTED
Streptococcus pneumoniae: NOT DETECTED

## 2016-09-18 LAB — BLOOD GAS, ARTERIAL
ACID-BASE EXCESS: 2.5 mmol/L — AB (ref 0.0–2.0)
BICARBONATE: 27.8 mmol/L (ref 20.0–28.0)
Drawn by: 331761
FIO2: 100
LHR: 20 {breaths}/min
MECHVT: 460 mL
O2 Saturation: 93.1 %
PEEP/CPAP: 10 cmH2O
Patient temperature: 98.6
pCO2 arterial: 54.2 mmHg — ABNORMAL HIGH (ref 32.0–48.0)
pH, Arterial: 7.331 — ABNORMAL LOW (ref 7.350–7.450)
pO2, Arterial: 75.4 mmHg — ABNORMAL LOW (ref 83.0–108.0)

## 2016-09-18 LAB — CBC
HEMATOCRIT: 16.4 % — AB (ref 36.0–46.0)
HEMOGLOBIN: 5.1 g/dL — AB (ref 12.0–15.0)
MCH: 26.6 pg (ref 26.0–34.0)
MCHC: 31.1 g/dL (ref 30.0–36.0)
MCV: 85.4 fL (ref 78.0–100.0)
PLATELETS: 124 10*3/uL — AB (ref 150–400)
RBC: 1.92 MIL/uL — AB (ref 3.87–5.11)
RDW: 17.4 % — ABNORMAL HIGH (ref 11.5–15.5)
WBC: 4.4 10*3/uL (ref 4.0–10.5)

## 2016-09-18 LAB — FERRITIN: FERRITIN: 480 ng/mL — AB (ref 11–307)

## 2016-09-18 LAB — HEMOGLOBIN AND HEMATOCRIT, BLOOD
HEMATOCRIT: 26.2 % — AB (ref 36.0–46.0)
HEMOGLOBIN: 8.6 g/dL — AB (ref 12.0–15.0)

## 2016-09-18 LAB — IRON AND TIBC
IRON: 68 ug/dL (ref 28–170)
SATURATION RATIOS: 31 % (ref 10.4–31.8)
TIBC: 218 ug/dL — AB (ref 250–450)
UIBC: 150 ug/dL

## 2016-09-18 LAB — PREPARE RBC (CROSSMATCH)

## 2016-09-18 LAB — PROCALCITONIN: Procalcitonin: 2.62 ng/mL

## 2016-09-18 LAB — VITAMIN B12: Vitamin B-12: 927 pg/mL — ABNORMAL HIGH (ref 180–914)

## 2016-09-18 LAB — MRSA PCR SCREENING: MRSA by PCR: NEGATIVE

## 2016-09-18 LAB — APTT: aPTT: 29 seconds (ref 24–36)

## 2016-09-18 LAB — PROTIME-INR
INR: 1.01
Prothrombin Time: 13.3 seconds (ref 11.4–15.2)

## 2016-09-18 LAB — RETICULOCYTES
RBC.: 1.76 MIL/uL — ABNORMAL LOW (ref 3.87–5.11)
RETIC CT PCT: 6.5 % — AB (ref 0.4–3.1)
Retic Count, Absolute: 114.4 10*3/uL (ref 19.0–186.0)

## 2016-09-18 LAB — FOLATE: Folate: 17.9 ng/mL (ref >5.9)

## 2016-09-18 LAB — LACTIC ACID, PLASMA
Lactic Acid, Venous: 0.9 mmol/L (ref 0.5–1.9)
Lactic Acid, Venous: 0.6 mmol/L (ref 0.5–1.9)

## 2016-09-18 LAB — I-STAT CG4 LACTIC ACID, ED: LACTIC ACID, VENOUS: 0.55 mmol/L (ref 0.5–1.9)

## 2016-09-18 MED ORDER — PRISMASOL BGK 4/2.5 32-4-2.5 MEQ/L IV SOLN
INTRAVENOUS | Status: DC
  Administered 2016-09-18 – 2016-09-21 (×3): via INTRAVENOUS_CENTRAL
  Filled 2016-09-18 (×3): qty 5000

## 2016-09-18 MED ORDER — RENA-VITE PO TABS
1.0000 | ORAL_TABLET | Freq: Every day | ORAL | Status: DC
  Administered 2016-09-18 – 2016-09-19 (×2): 1 via ORAL
  Filled 2016-09-18 (×2): qty 1

## 2016-09-18 MED ORDER — ORAL CARE MOUTH RINSE
15.0000 mL | Freq: Four times a day (QID) | OROMUCOSAL | Status: DC
  Administered 2016-09-18 – 2016-09-24 (×22): 15 mL via OROMUCOSAL

## 2016-09-18 MED ORDER — HEPARIN SODIUM (PORCINE) 1000 UNIT/ML DIALYSIS
1000.0000 [IU] | INTRAMUSCULAR | Status: DC | PRN
  Filled 2016-09-18: qty 6

## 2016-09-18 MED ORDER — CHLORHEXIDINE GLUCONATE 0.12% ORAL RINSE (MEDLINE KIT)
15.0000 mL | Freq: Two times a day (BID) | OROMUCOSAL | Status: DC
  Administered 2016-09-18 – 2016-09-24 (×12): 15 mL via OROMUCOSAL

## 2016-09-18 MED ORDER — NORTRIPTYLINE HCL 10 MG PO CAPS
10.0000 mg | ORAL_CAPSULE | Freq: Every day | ORAL | Status: DC
  Filled 2016-09-18: qty 1

## 2016-09-18 MED ORDER — ACETAMINOPHEN 325 MG PO TABS: 650.0000 mg | ORAL_TABLET | Freq: Four times a day (QID) | ORAL | Status: DC | PRN

## 2016-09-18 MED ORDER — INSULIN ASPART 100 UNIT/ML ~~LOC~~ SOLN
2.0000 [IU] | SUBCUTANEOUS | Status: DC
  Administered 2016-09-18 – 2016-09-20 (×2): 2 [IU] via SUBCUTANEOUS
  Administered 2016-09-20: 4 [IU] via SUBCUTANEOUS
  Administered 2016-09-20 – 2016-09-21 (×3): 2 [IU] via SUBCUTANEOUS
  Administered 2016-09-21 – 2016-09-22 (×4): 4 [IU] via SUBCUTANEOUS
  Administered 2016-09-22 (×2): 2 [IU] via SUBCUTANEOUS
  Administered 2016-09-22 (×2): 4 [IU] via SUBCUTANEOUS
  Administered 2016-09-23: 2 [IU] via SUBCUTANEOUS
  Administered 2016-09-23 (×2): 4 [IU] via SUBCUTANEOUS
  Administered 2016-09-24: 6 [IU] via SUBCUTANEOUS
  Administered 2016-09-24 (×2): 4 [IU] via SUBCUTANEOUS

## 2016-09-18 MED ORDER — MIDAZOLAM HCL 2 MG/2ML IJ SOLN
2.0000 mg | INTRAMUSCULAR | Status: DC | PRN
  Administered 2016-09-21 – 2016-09-22 (×5): 2 mg via INTRAVENOUS
  Filled 2016-09-18 (×6): qty 2

## 2016-09-18 MED ORDER — ALPRAZOLAM 0.25 MG PO TABS
0.2500 mg | ORAL_TABLET | Freq: Three times a day (TID) | ORAL | Status: DC | PRN
  Filled 2016-09-18: qty 2

## 2016-09-18 MED ORDER — BISACODYL 10 MG RE SUPP: 10.0000 mg | Freq: Every day | RECTAL | Status: DC | PRN

## 2016-09-18 MED ORDER — FENTANYL BOLUS VIA INFUSION
50.0000 ug | INTRAVENOUS | Status: DC | PRN
  Administered 2016-09-18 – 2016-09-20 (×3): 50 ug via INTRAVENOUS
  Filled 2016-09-18: qty 50

## 2016-09-18 MED ORDER — NEPRO/CARBSTEADY PO LIQD
237.0000 mL | Freq: Three times a day (TID) | ORAL | Status: DC | PRN
  Filled 2016-09-18: qty 237

## 2016-09-18 MED ORDER — ZOLPIDEM TARTRATE 5 MG PO TABS: 5.0000 mg | ORAL_TABLET | Freq: Every evening | ORAL | Status: DC | PRN

## 2016-09-18 MED ORDER — FENTANYL CITRATE (PF) 100 MCG/2ML IJ SOLN: 50.0000 ug | Freq: Once | INTRAMUSCULAR | Status: DC

## 2016-09-18 MED ORDER — CINACALCET HCL 30 MG PO TABS
30.0000 mg | ORAL_TABLET | Freq: Every day | ORAL | Status: DC
  Administered 2016-09-18 – 2016-09-21 (×2): 30 mg via ORAL
  Filled 2016-09-18 (×7): qty 1

## 2016-09-18 MED ORDER — PRISMASOL BGK 4/2.5 32-4-2.5 MEQ/L IV SOLN
INTRAVENOUS | Status: DC
  Administered 2016-09-18 – 2016-09-21 (×4): via INTRAVENOUS_CENTRAL
  Filled 2016-09-18 (×5): qty 5000

## 2016-09-18 MED ORDER — PREGABALIN 75 MG PO CAPS
75.0000 mg | ORAL_CAPSULE | Freq: Two times a day (BID) | ORAL | Status: DC
  Administered 2016-09-18: 75 mg via ORAL
  Filled 2016-09-18: qty 1

## 2016-09-18 MED ORDER — CALCIUM CARBONATE ANTACID 1250 MG/5ML PO SUSP
500.0000 mg | Freq: Four times a day (QID) | ORAL | Status: DC | PRN
  Filled 2016-09-18: qty 5

## 2016-09-18 MED ORDER — FENTANYL CITRATE (PF) 100 MCG/2ML IJ SOLN
INTRAMUSCULAR | Status: AC
  Administered 2016-09-18: 100 ug
  Filled 2016-09-18: qty 2

## 2016-09-18 MED ORDER — PRAVASTATIN SODIUM 20 MG PO TABS
20.0000 mg | ORAL_TABLET | Freq: Every day | ORAL | Status: DC
  Administered 2016-09-18 – 2016-09-22 (×5): 20 mg via ORAL
  Filled 2016-09-18 (×7): qty 1

## 2016-09-18 MED ORDER — DOCUSATE SODIUM 283 MG RE ENEM
1.0000 | ENEMA | RECTAL | Status: DC | PRN
  Filled 2016-09-18: qty 1

## 2016-09-18 MED ORDER — SEVELAMER CARBONATE 800 MG PO TABS
2400.0000 mg | ORAL_TABLET | ORAL | Status: DC | PRN
  Filled 2016-09-18: qty 3

## 2016-09-18 MED ORDER — SODIUM CHLORIDE 0.9 % FOR CRRT
INTRAVENOUS_CENTRAL | Status: DC | PRN
  Administered 2016-09-18: via INTRAVENOUS_CENTRAL
  Filled 2016-09-18 (×2): qty 1000

## 2016-09-18 MED ORDER — MIDAZOLAM HCL 2 MG/2ML IJ SOLN
INTRAMUSCULAR | Status: AC
  Administered 2016-09-18: 2 mg
  Filled 2016-09-18: qty 4

## 2016-09-18 MED ORDER — SORBITOL 70 % SOLN
30.0000 mL | Status: DC | PRN
  Filled 2016-09-18: qty 30

## 2016-09-18 MED ORDER — FENTANYL 2500MCG IN NS 250ML (10MCG/ML) PREMIX INFUSION
20.0000 ug/h | INTRAVENOUS | Status: DC
  Administered 2016-09-18: 50 ug/h via INTRAVENOUS
  Administered 2016-09-19 – 2016-09-21 (×3): 100 ug/h via INTRAVENOUS
  Administered 2016-09-22 – 2016-09-24 (×2): 50 ug/h via INTRAVENOUS
  Filled 2016-09-18 (×6): qty 250

## 2016-09-18 MED ORDER — VANCOMYCIN HCL IN DEXTROSE 750-5 MG/150ML-% IV SOLN: 750.0000 mg | INTRAVENOUS | Status: DC

## 2016-09-18 MED ORDER — SEVELAMER CARBONATE 800 MG PO TABS
4800.0000 mg | ORAL_TABLET | Freq: Three times a day (TID) | ORAL | Status: DC
  Administered 2016-09-18 – 2016-09-22 (×6): 4800 mg via ORAL
  Filled 2016-09-18 (×21): qty 6

## 2016-09-18 MED ORDER — CALCITRIOL 0.5 MCG PO CAPS
1.5000 ug | ORAL_CAPSULE | ORAL | Status: DC
  Administered 2016-09-22: 1.5 ug via ORAL
  Filled 2016-09-18 (×4): qty 3

## 2016-09-18 MED ORDER — FUROSEMIDE 10 MG/ML IJ SOLN
20.0000 mg | Freq: Once | INTRAMUSCULAR | Status: AC
  Administered 2016-09-18: 20 mg via INTRAVENOUS
  Filled 2016-09-18: qty 2

## 2016-09-18 MED ORDER — FUROSEMIDE 10 MG/ML IJ SOLN
80.0000 mg | Freq: Once | INTRAMUSCULAR | Status: AC
  Administered 2016-09-18: 80 mg via INTRAVENOUS
  Filled 2016-09-18: qty 8

## 2016-09-18 MED ORDER — PRISMASOL BGK 4/2.5 32-4-2.5 MEQ/L IV SOLN
INTRAVENOUS | Status: DC
  Administered 2016-09-18 – 2016-09-21 (×21): via INTRAVENOUS_CENTRAL
  Filled 2016-09-18 (×30): qty 5000

## 2016-09-18 MED ORDER — FENTANYL 2500MCG IN NS 250ML (10MCG/ML) PREMIX INFUSION: 25.0000 ug/h | INTRAVENOUS | Status: DC

## 2016-09-18 MED ORDER — VITAMIN B-1 100 MG PO TABS
100.0000 mg | ORAL_TABLET | Freq: Every day | ORAL | Status: DC
  Administered 2016-09-18 – 2016-09-22 (×5): 100 mg via ORAL
  Filled 2016-09-18 (×6): qty 1

## 2016-09-18 MED ORDER — TEMAZEPAM 15 MG PO CAPS: 30.0000 mg | ORAL_CAPSULE | Freq: Every evening | ORAL | Status: DC | PRN

## 2016-09-18 MED ORDER — SODIUM CHLORIDE 0.9% FLUSH
3.0000 mL | Freq: Two times a day (BID) | INTRAVENOUS | Status: DC
  Administered 2016-09-19 – 2016-09-23 (×8): 3 mL via INTRAVENOUS

## 2016-09-18 MED ORDER — PREGABALIN 75 MG PO CAPS
75.0000 mg | ORAL_CAPSULE | Freq: Every day | ORAL | Status: DC
  Administered 2016-09-19 – 2016-09-22 (×4): 75 mg via ORAL
  Filled 2016-09-18 (×4): qty 1

## 2016-09-18 MED ORDER — NOREPINEPHRINE BITARTRATE 1 MG/ML IV SOLN
2.0000 ug/min | INTRAVENOUS | Status: DC
  Administered 2016-09-22: 3 ug/min via INTRAVENOUS
  Filled 2016-09-18 (×4): qty 4

## 2016-09-18 MED ORDER — DOCUSATE SODIUM 50 MG/5ML PO LIQD: 100.0000 mg | Freq: Two times a day (BID) | ORAL | Status: DC | PRN

## 2016-09-18 MED ORDER — ACETAMINOPHEN 650 MG RE SUPP: 650.0000 mg | Freq: Four times a day (QID) | RECTAL | Status: DC | PRN

## 2016-09-18 MED ORDER — MIDAZOLAM HCL 2 MG/2ML IJ SOLN
2.0000 mg | INTRAMUSCULAR | Status: AC | PRN
  Administered 2016-09-19 – 2016-09-21 (×3): 2 mg via INTRAVENOUS
  Filled 2016-09-18 (×2): qty 2

## 2016-09-18 MED ORDER — SODIUM CHLORIDE 0.9 % IV SOLN: Freq: Once | INTRAVENOUS | Status: DC

## 2016-09-18 MED ORDER — OXYCODONE-ACETAMINOPHEN 5-325 MG PO TABS
1.0000 | ORAL_TABLET | Freq: Four times a day (QID) | ORAL | Status: DC | PRN
  Administered 2016-09-18: 1 via ORAL
  Filled 2016-09-18: qty 1

## 2016-09-18 NOTE — Consult Note (Signed)
PULMONARY / CRITICAL CARE MEDICINE   Name: [name] [name] MRN: [id number] DOB: [birth year]    ADMISSION DATE:  09/23/2016 CONSULTATION DATE:  11/16  REFERRING MD:  Dr. 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 She presented to [facility] ED 11/15 from HD with complaints of SOB and subjective feeling of face/neck swelling. She was evaluated in the ED with concern for PNA and was admitted to the hospitalists and treated with IV antibiotics.   She had some acute on chronic anemia 11/16 prompting the administration of 2 units PRBC on 11/16, after which, she developed worsening SOB ultimately resulting in respiratory arrest requiring emergent intubation. PCCM to assume care.  PAST MEDICAL HISTORY :  She  has a past medical history of Anemia; Anxiety; Arthritis; Blood transfusion without reported diagnosis; Chronic ankle pain; Colitis; Diabetes mellitus ([place]); ESRD (end-stage renal disease) due to SLE ([place]); Hemiparesis and alteration of sensations as late effects of stroke ([place]) (06/19/2016); Hemorrhoids; Hyperlipidemia; Hyperplastic colon polyp; Hypertension; Hyponatremia; Lower GI bleed; Lupus; Membranous glomerulonephritis; Metabolic acidosis; Metatarsal bone fracture (right); Myocardial infarction; Pain in joints; Paresthesias; Pneumonia; Stenosis of cervical spine region; and Stroke ([place]) (11/2014).  PAST SURGICAL HISTORY: She  has a past surgical history that includes Breast biopsy; Tubal ligation; Insertion of dialysis catheter (Right, 02/05/2015); Bascilic vein transposition (Left, 02/14/2015); Colonoscopy w/ biopsies and polypectomy; Bascilic vein transposition (Left, 03/28/2015); Revison of arteriovenous fistula (Left, 09/26/2015); Insertion of dialysis catheter (N/A, 10/05/2015); Cardiac catheterization (N/A, 10/10/2015); Ligation of arteriovenous  fistula (Left, 12/21/2015); AV  fistula placement (Right, 12/21/2015); Insertion of dialysis catheter (Left, 12/21/2015); Cardiac catheterization (N/A, 03/18/2016); Cardiac catheterization (Left, 03/18/2016); Bascilic vein transposition (Right, 04/17/2016); and ir generic historical (Right, 06/12/2016).  Allergies  Allergen Reactions  . Sulfa Antibiotics Itching    No current facility-administered medications on file prior to encounter.    Current Outpatient Prescriptions on File Prior to Encounter  Medication Sig  . ALPRAZolam (XANAX) 0.25 MG tablet Take 1 tablet (0.25 mg total) by mouth every 8 (eight) hours as needed for anxiety (0.25mg -0.5mg ). (Patient taking differently: Take 0.25-0.5 mg by mouth every 8 (eight) hours as needed for anxiety. )  . cinacalcet (SENSIPAR) 30 MG tablet Take 30 mg by mouth daily.  Marland Kitchen LYRICA 75 MG capsule take 1 capsule by mouth twice a day  . multivitamin (RENA-VIT) TABS tablet Take 1 tablet by mouth daily.  . nortriptyline (PAMELOR) 10 MG capsule Take one capsule at night for one week, then take 2 capsules at night for one week, then take 3 capsules at night (Patient taking differently: Take 10 mg by mouth at bedtime. Take one capsule at night for one week, then take 2 capsules at night for one week, then take 3 capsules at night)  . oxyCODONE-acetaminophen (PERCOCET/ROXICET) 5-325 MG tablet Take 1 tablet by mouth every 6 (six) hours as needed.  . pantoprazole (PROTONIX) 40 MG tablet Take 1 tablet (40 mg total) by mouth daily.  . pravastatin (PRAVACHOL) 20 MG tablet Take 20 mg by mouth daily.  Marland Kitchen RENVELA 800 MG tablet Take 2,400-4,800 mg by mouth See admin instructions. Takes six tablets three times daily with each meal and three tablets with snacks.  . temazepam (RESTORIL) 30 MG capsule Take 30 mg by mouth at bedtime as needed for sleep.   Marland Kitchen thiamine 100 MG tablet Take 1 tablet (100 mg total)  by mouth daily.    FAMILY HISTORY:    SOCIAL HISTORY: She  reports that she quit smoking about 1 years  ago. Her smoking use included Cigarettes. She has a 15.00 pack-year smoking history. She has never used smokeless tobacco. She reports that she does not drink alcohol or use drugs.  REVIEW OF SYSTEMS:   Unable  SUBJECTIVE:    VITAL SIGNS: BP 100/80   Pulse (!) 112   Temp 98 F (36.7 C) (Oral)   Resp 20   Ht 5\' 5"  (1.651 m)   Wt 78 kg (172 lb)   LMP 07/18/2012   SpO2 100%   BMI 28.62 kg/m   HEMODYNAMICS:    VENTILATOR SETTINGS:    INTAKE / OUTPUT: I/O last 3 completed shifts: In: 1562.5 [IV Piggyback:1562.5] Out: -   PHYSICAL EXAMINATION: General:  Obese female in respiratory distess Neuro:  Initially alert and answering questions and quickly became unresponsive and apneic HEENT:  [place]/AT, JVD, PERRL Cardiovascular:  Tachy, regular, improving with sedation Lungs:  Coarse crackles Abdomen:  Soft, non-distended Musculoskeletal:  No acute deformity. RUE AV fistula Skin: Grossly  LABS:  BMET  Recent Labs Lab 09/29/2016 1945 09/18/16 0336  NA 136 139  K 3.7 4.0  CL 96* 101  CO2 27 30  BUN 28* 33*  CREATININE 4.38* 5.12*  GLUCOSE 110* 89    Electrolytes  Recent Labs Lab 09/27/2016 1945 09/18/16 0336  CALCIUM 8.6* 8.7*    CBC  Recent Labs Lab 09/22/2016 1945 09/18/16 0515  WBC 2.3* 4.4  HGB 11.9* 5.1*  HCT 37.6 16.4*  PLT 86* 124*    Coag's  Recent Labs Lab 09/18/16 0336  APTT 29  INR 1.01    Sepsis Markers  Recent Labs Lab 09/18/16 0008 09/18/16 0336 09/18/16 0515  LATICACIDVEN 0.55 0.9 0.6  PROCALCITON  --  2.62  --     ABG No results for input(s): PHART, PCO2ART, PO2ART in the last 168 hours.  Liver Enzymes  Recent Labs Lab 09/06/2016 1945  AST 20  ALT 9*  ALKPHOS 55  BILITOT 0.5  ALBUMIN 2.4*    Cardiac Enzymes No results for input(s): TROPONINI, PROBNP in the last 168 hours.  Glucose  Recent Labs Lab 09/18/16 0619  GLUCAP 86    Imaging Ct Head Wo Contrast  Result Date: 09/23/2016 CLINICAL DATA:   Multiple falls over the past few days. Does not recall if she struck her head today. EXAM: CT HEAD WITHOUT CONTRAST TECHNIQUE: Contiguous axial images were obtained from the base of the skull through the vertex without intravenous contrast. COMPARISON:  01/28/2015 FINDINGS: Brain: No evidence of acute infarction, hemorrhage, hydrocephalus, extra-axial collection or mass lesion/mass effect. There is mild generalized atrophy. There is white matter hypodensity which is chronic and likely due to small vessel ischemic disease. Vascular: No hyperdense vessel or unexpected calcification. Skull: Normal. Negative for fracture or focal lesion. Sinuses/Orbits: No acute finding. Other: None. IMPRESSION: No acute intracranial findings. There is mild generalized atrophy and chronic appearing white matter hypodensities which likely represent small vessel ischemic disease. Electronically Signed   By: Andreas Newport M.D.   On: 09/16/2016 22:37   Dg Chest Port 1 View  Result Date: 09/28/2016 CLINICAL DATA:  Shortness of breath and confusion for 2 days. EXAM: PORTABLE CHEST 1 VIEW COMPARISON:  Radiographs 01/07/2016 FINDINGS: Bilateral lobe airspace opacity and pleural effusions, left greater than right. Heart appears mildly enlarged. There is vascular congestion and possible mild central edema. No pneumothorax. Rounded  calcific density in the region of the coracoclavicular ligament is unchanged from prior exams and chronic. IMPRESSION: Findings suspicious for fluid overload/ CHF with vascular congestion, pleural effusions, and possible mild central pulmonary edema. Electronically Signed   By: Jeb Levering M.D.   On: 09/06/2016 20:09     STUDIES:    CULTURES: Blood >>> Urine >>>  ANTIBIOTICS: Vanc 11/15 > 11/16 Zosyn 11/15 > 11/16  SIGNIFICANT EVENTS: 11/15 admit 11/16 intubated for respiratory distress  LINES/TUBES: ETT 11/16 >  DISCUSSION: 55 year old female with ESRD on HD admitted for PNA 11/15,  however, CXR at that time, given current changes, more likely represented volume overload. She likely had incomplete HD 11/15 and got volume in the way of IVF, ABX, and blood products since being admitted. I suspect she suffered flash pulmonary edema and will require urgent HD tonight.   ASSESSMENT / PLAN:  PULMONARY A: Acute hypoxemic respiratory failure secondary to pulmonary edema ?TRALI  P:   Full vent support CXR ABG VAP bundle HD for volume removal  CARDIOVASCULAR A:  Tachycardia post intubation H/o chronic hypotension, MI  P:  Telemetry monitoring If HR doesn't improve with sedation, HD will consider BB Trend troponin  RENAL A:   ESRD on HD  P:   Urgent HD per nephrology  GASTROINTESTINAL A:   ? GIB  P:   NPO for now Occult blood testing  HEMATOLOGIC A:   Anemia (baseline Hgb 9-10)  P:  S/p 2 units PRBC Repeat Hgb Transfuse for Hgb < 7  INFECTIOUS A:   ? PNA - doubt  P:   DC abx Follow WBC and fevers  ENDOCRINE A:   DM  P:   CBG monitoring and SSI  NEUROLOGIC A:   Acute encephalopathy  P:   RASS goal: -1 Fentanyl gtt   FAMILY  - Updates: family updated by [facility]  - Inter-disciplinary family meet or Palliative Care meeting due by:  11/24  APP CC time 30 mins  Georgann Housekeeper, [facility] [place] Pulmonology/Critical Care Pager [phone] or [phone]  09/18/2016 6:12 PM

## 2016-09-18 NOTE — Progress Notes (Signed)
   09/18/16 1700  Clinical Encounter Type  Visited With Family;Patient not available;Health care provider  Visit Type Code;Critical Care  Referral From Nurse  Consult/Referral To Chaplain  Recommendations (Crisis Care/follow up)  Spiritual Encounters  Spiritual Needs Emotional  Stress Factors  Patient Stress Factors None identified  Family Stress Factors Family relationships;Lack of knowledge    Chaplain responded to code/crisis on 27m. Pt had coded, potentially due to renal failure issues. Dialysis team called and patient stabilized. Family distraught, when chaplain arrived but once realized that chaplain brought for support emotional stability seemed to arise. Chaplain provided emotional support during crisis.

## 2016-09-18 NOTE — Consult Note (Signed)
[facility] Renal Consultation Note  Indication for Consultation:  Management of ESRD/hemodialysis; anemia, hypertension/volume and secondary hyperparathyroidism  HPI: [name] [name] is a 55 y.o. female with ESRD sec. Lupus Chronic HD MWF ([place]) , CVA 2016, CAD , HTN  Sent from [place] after complete tx yesterday co SOB and found to be tachycardic on HD .Admitted with PNA,  Hypotension  BP 78/69 in ER,  temp 100.8 ,Anemia  HGB  11.9 then reck = 5.1 (noted last OP [facility]  HGB 9.9 on 09/10/16, 10.2 on 11/01, 10.8 on 08/27/16) ,Prolonged QT interval,denies melania, bleed from AVF , no recent surgeries. She reports some sob usually when walking into HD unit  but usually resolves after HD treatment over but yesterday SOB and felt her heart racing . Denies chills , fevers, cough , chest pain .     Past Medical History:  Diagnosis Date  . Anemia   . Anxiety   . Arthritis    RA  . Blood transfusion without reported diagnosis   . Chronic ankle pain    due to RA?  Marland Kitchen Colitis    ? per hospital notes 11/2014  . Diabetes mellitus (HCC)    border line  . 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 Hemiparesis and alteration of sensations as late effects of stroke ([place]) 06/19/2016  . Hemorrhoids   . Hyperlipidemia   . Hyperplastic colon polyp   . Hypertension   . Hyponatremia   . Lower GI bleed   . Lupus   . 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 Metabolic acidosis   . Metatarsal bone fracture right   4th  . Myocardial infarction   . Pain in joints   . Paresthesias   . Pneumonia   . Stenosis of cervical spine region    with HNP at C5/6, C6/7  . Stroke [facility]) 11/2014   left sided weakness, dysphagia    Past Surgical History:  Procedure Laterality Date  . AV FISTULA PLACEMENT Right 12/21/2015   Procedure: ARTERIOVENOUS (AV) FISTULA CREATION RIGHT ARM;  Surgeon: [name], MD;  Location: [place];  Service: Vascular;  Laterality: Right;  .  BASCILIC VEIN TRANSPOSITION Left 02/14/2015   Procedure: BASILIC VEIN Fistula Creation First Stage;  Surgeon: Rosetta Posner, MD;  Location: [place];  Service: Vascular;  Laterality: Left;  . BASCILIC VEIN TRANSPOSITION Left 03/28/2015   Procedure: LEFT ARM SECOND STAGE BASCILIC VEIN TRANSPOSITION;  Surgeon: Rosetta Posner, MD;  Location: [place];  Service: Vascular;  Laterality: Left;  . BASCILIC VEIN TRANSPOSITION Right 04/17/2016   Procedure: RIGHT SECOND STAGE BASILIC VEIN TRANSPOSITION;  Surgeon: [name], MD;  Location: [place];  Service: Vascular;  Laterality: Right;  . BREAST BIOPSY    . COLONOSCOPY W/ BIOPSIES AND POLYPECTOMY    . INSERTION OF DIALYSIS CATHETER Right 02/05/2015   Procedure: INSERTION OF DIALYSIS CATHETER - RIGHT INTERNAL JUGULAR;  Surgeon: Conrad Nelson, MD;  Location: [place];  Service: Vascular;  Laterality: Right;  . INSERTION OF DIALYSIS CATHETER N/A 10/05/2015   Procedure: INSERTION OF DIALYSIS CATHETER Right Internal Jugular.;  Surgeon: Mal Misty, MD;  Location: [place];  Service: Vascular;  Laterality: N/A;  . INSERTION OF DIALYSIS CATHETER Left 12/21/2015   Procedure: INSERTION OF DIALYSIS CATHETER LEFT INTERNAL JUGULAR VEIN;  Surgeon: [name], MD;  Location: [place];  Service: Vascular;  Laterality: Left;  .  IR GENERIC HISTORICAL Right 06/12/2016   IR DIALY SHUNT INTRO NEEDLE/INTRACATH INITIAL W/IMG RIGHT 06/12/2016 Markus Daft, MD [facility]-INTERV RAD  . LIGATION OF ARTERIOVENOUS  FISTULA Left 12/21/2015   Procedure: LIGATION OF ARTERIOVENOUS  FISTULA LEFT ARM;  Surgeon: [name], MD;  Location: [place];  Service: Vascular;  Laterality: Left;  . PERIPHERAL VASCULAR CATHETERIZATION N/A 10/10/2015   Procedure: Fistulagram;  Surgeon: [name], MD;  Location: [place] CV LAB;  Service: Cardiovascular;  Laterality: N/A;  . PERIPHERAL VASCULAR CATHETERIZATION N/A 03/18/2016   Procedure: Aortic Arch Angiography;  Surgeon: [name], MD;  Location: [place] CV  LAB;  Service: Cardiovascular;  Laterality: N/A;  . PERIPHERAL VASCULAR CATHETERIZATION Left 03/18/2016   Procedure: Peripheral Vascular Balloon Angioplasty;  Surgeon: [name], MD;  Location: [place] CV LAB;  Service: Cardiovascular;  Laterality: Left;  subclavian  . REVISON OF ARTERIOVENOUS FISTULA Left 09/26/2015   Procedure: Revision Left Basilic Vein Transposition with Banding using Gortex graft;  Surgeon: Mal Misty, MD;  Location: [place];  Service: Vascular;  Laterality: Left;  . TUBAL LIGATION        Family History  Problem Relation Age of Onset  . Hypertension Mother   . Diabetes Mother   . Stroke Mother   . Hypertension Sister   . Diabetes Father   . Hypertension Maternal Grandmother   . Hypertension    . Lupus    . Rheum arthritis    . Colon cancer Neg Hx       reports that she quit smoking about 1 years ago. Her smoking use included Cigarettes. She has a 15.00 pack-year smoking history. She has never used smokeless tobacco. She reports that she does not drink alcohol or use drugs.   Allergies  Allergen Reactions  . Sulfa Antibiotics Itching    Prior to Admission medications   Medication Sig Start Date End Date Taking? Authorizing Provider  acetaminophen (TYLENOL) 325 MG tablet Take 650 mg by mouth every 6 (six) hours as needed for moderate pain.    Yes Historical Provider, MD  ALPRAZolam (XANAX) 0.25 MG tablet Take 1 tablet (0.25 mg total) by mouth every 8 (eight) hours as needed for anxiety (0.'25mg'$ -0.'5mg'$ ). Patient taking differently: Take 0.25-0.5 mg by mouth every 8 (eight) hours as needed for anxiety.  02/22/15  Yes Daniel J Angiulli, PA-C  Aspirin-Salicylamide-Caffeine (BC FAST PAIN RELIEF) 650-195-33.3 MG PACK Take 1 Package by mouth daily as needed (pain).   Yes Historical Provider, MD  cinacalcet (SENSIPAR) 30 MG tablet Take 30 mg by mouth daily.   Yes Historical Provider, MD  LYRICA 75 MG capsule take 1 capsule by mouth twice a day 07/09/16  Yes  Gay Filler Copland, MD  multivitamin (RENA-VIT) TABS tablet Take 1 tablet by mouth daily.   Yes Historical Provider, MD  nortriptyline (PAMELOR) 10 MG capsule Take one capsule at night for one week, then take 2 capsules at night for one week, then take 3 capsules at night Patient taking differently: Take 10 mg by mouth at bedtime. Take one capsule at night for one week, then take 2 capsules at night for one week, then take 3 capsules at night 06/19/16  Yes Kathrynn Ducking, MD  oxyCODONE-acetaminophen (PERCOCET/ROXICET) 5-325 MG tablet Take 1 tablet by mouth every 6 (six) hours as needed. 04/17/16  Yes Ulyses Amor, PA-C  pantoprazole (PROTONIX) 40 MG tablet Take 1 tablet (40 mg total) by mouth daily. 02/22/15  Yes Lavon Paganini  Angiulli, PA-C  pravastatin (PRAVACHOL) 20 MG tablet Take 20 mg by mouth daily.   Yes Historical Provider, MD  RENVELA 800 MG tablet Take 2,400-4,800 mg by mouth See admin instructions. Takes six tablets three times daily with each meal and three tablets with snacks. 03/27/16  Yes Historical Provider, MD  temazepam (RESTORIL) 30 MG capsule Take 30 mg by mouth at bedtime as needed for sleep.    Yes Historical Provider, MD  thiamine 100 MG tablet Take 1 tablet (100 mg total) by mouth daily. 02/22/15  Yes Lavon Paganini Angiulli, PA-C    [place]O:VZCHYIFOYDXAJ **OR** acetaminophen, ALPRAZolam, calcium carbonate (dosed in mg elemental calcium), docusate sodium, feeding supplement (NEPRO CARB STEADY), oxyCODONE-acetaminophen, sevelamer carbonate, sorbitol, temazepam, zolpidem  Results for orders placed or performed during the hospital encounter of 09/26/2016 (from the past 48 hour(s))  Comprehensive metabolic panel     Status: Abnormal   Collection Time: 09/04/2016  7:45 PM  Result Value Ref Range   Sodium 136 135 - 145 mmol/L   Potassium 3.7 3.5 - 5.1 mmol/L   Chloride 96 (L) 101 - 111 mmol/L   CO2 27 22 - 32 mmol/L   Glucose, Bld 110 (H) 65 - 99 mg/dL   BUN 28 (H) 6 - 20 mg/dL   Creatinine, Ser  4.38 (H) 0.44 - 1.00 mg/dL   Calcium 8.6 (L) 8.9 - 10.3 mg/dL   Total Protein 6.0 (L) 6.5 - 8.1 g/dL   Albumin 2.4 (L) 3.5 - 5.0 g/dL   AST 20 15 - 41 U/L   ALT 9 (L) 14 - 54 U/L   Alkaline Phosphatase 55 38 - 126 U/L   Total Bilirubin 0.5 0.3 - 1.2 mg/dL   GFR calc non Af Amer 11 (L) >60 mL/min   GFR calc Af Amer 12 (L) >60 mL/min    Comment: (NOTE) The eGFR has been calculated using the CKD EPI equation. This calculation has not been validated in all clinical situations. eGFR's persistently <60 mL/min signify possible Chronic Kidney Disease.    Anion gap 13 5 - 15  CBC WITH DIFFERENTIAL     Status: Abnormal   Collection Time: 09/29/2016  7:45 PM  Result Value Ref Range   WBC 2.3 (L) 4.0 - 10.5 K/uL   RBC 4.40 3.87 - 5.11 MIL/uL   Hemoglobin 11.9 (L) 12.0 - 15.0 g/dL   HCT 37.6 36.0 - 46.0 %   MCV 85.5 78.0 - 100.0 fL   MCH 27.0 26.0 - 34.0 pg   MCHC 31.6 30.0 - 36.0 g/dL   RDW 17.0 (H) 11.5 - 15.5 %   Platelets 86 (L) 150 - 400 K/uL    Comment: PLATELET COUNT CONFIRMED BY SMEAR   Neutrophils Relative % 63 %   Neutro Abs 1.5 (L) 1.7 - 7.7 K/uL   Lymphocytes Relative 29 %   Lymphs Abs 0.7 0.7 - 4.0 K/uL   Monocytes Relative 7 %   Monocytes Absolute 0.2 0.1 - 1.0 K/uL   Eosinophils Relative 1 %   Eosinophils Absolute 0.0 0.0 - 0.7 K/uL   Basophils Relative 0 %   Basophils Absolute 0.0 0.0 - 0.1 K/uL  I-Stat CG4 Lactic Acid, ED  (not at  [facility])     Status: None   Collection Time: 09/03/2016  7:56 PM  Result Value Ref Range   Lactic Acid, Venous 1.42 0.5 - 1.9 mmol/L  I-Stat CG4 Lactic Acid, ED  (not at  [facility])     Status: None  Collection Time: 09/18/16 12:08 AM  Result Value Ref Range   Lactic Acid, Venous 0.55 0.5 - 1.9 mmol/L  Lactic acid, plasma     Status: None   Collection Time: 09/18/16  3:36 AM  Result Value Ref Range   Lactic Acid, Venous 0.9 0.5 - 1.9 mmol/L  Procalcitonin     Status: None   Collection Time: 09/18/16  3:36 AM  Result Value Ref Range    Procalcitonin 2.62 ng/mL    Comment:        Interpretation: PCT > 2 ng/mL: Systemic infection (sepsis) is likely, unless other causes are known. (NOTE)         ICU PCT Algorithm               Non ICU PCT Algorithm    ----------------------------     ------------------------------         PCT < 0.25 ng/mL                 PCT < 0.1 ng/mL     Stopping of antibiotics            Stopping of antibiotics       strongly encouraged.               strongly encouraged.    ----------------------------     ------------------------------       PCT level decrease by               PCT < 0.25 ng/mL       >= 80% from peak PCT       OR PCT 0.25 - 0.5 ng/mL          Stopping of antibiotics                                             encouraged.     Stopping of antibiotics           encouraged.    ----------------------------     ------------------------------       PCT level decrease by              PCT >= 0.25 ng/mL       < 80% from peak PCT        AND PCT >= 0.5 ng/mL            Continuing antibiotics                                               encouraged.       Continuing antibiotics            encouraged.    ----------------------------     ------------------------------     PCT level increase compared          PCT > 0.5 ng/mL         with peak PCT AND          PCT >= 0.5 ng/mL             Escalation of antibiotics  strongly encouraged.      Escalation of antibiotics        strongly encouraged.   Protime-INR     Status: None   Collection Time: 09/18/16  3:36 AM  Result Value Ref Range   Prothrombin Time 13.3 11.4 - 15.2 seconds   INR 1.01   APTT     Status: None   Collection Time: 09/18/16  3:36 AM  Result Value Ref Range   aPTT 29 24 - 36 seconds  Basic metabolic panel     Status: Abnormal   Collection Time: 09/18/16  3:36 AM  Result Value Ref Range   Sodium 139 135 - 145 mmol/L   Potassium 4.0 3.5 - 5.1 mmol/L   Chloride 101 101 - 111  mmol/L   CO2 30 22 - 32 mmol/L   Glucose, Bld 89 65 - 99 mg/dL   BUN 33 (H) 6 - 20 mg/dL   Creatinine, Ser 5.12 (H) 0.44 - 1.00 mg/dL   Calcium 8.7 (L) 8.9 - 10.3 mg/dL   GFR calc non Af Amer 9 (L) >60 mL/min   GFR calc Af Amer 10 (L) >60 mL/min    Comment: (NOTE) The eGFR has been calculated using the CKD EPI equation. This calculation has not been validated in all clinical situations. eGFR's persistently <60 mL/min signify possible Chronic Kidney Disease.    Anion gap 8 5 - 15  Lactic acid, plasma     Status: None   Collection Time: 09/18/16  5:15 AM  Result Value Ref Range   Lactic Acid, Venous 0.6 0.5 - 1.9 mmol/L  CBC     Status: Abnormal   Collection Time: 09/18/16  5:15 AM  Result Value Ref Range   WBC 4.4 4.0 - 10.5 K/uL   RBC 1.92 (L) 3.87 - 5.11 MIL/uL   Hemoglobin 5.1 (LL) 12.0 - 15.0 g/dL    Comment: RESULTS VERIFIED VIA RECOLLECT REPEATED TO VERIFY CRITICAL RESULT CALLED TO, READ BACK BY AND VERIFIED WITH: JENNIFER GLAFTON,RN AT 0559 09/18/16 BY ZBEECH.    HCT 16.4 (L) 36.0 - 46.0 %   MCV 85.4 78.0 - 100.0 fL   MCH 26.6 26.0 - 34.0 pg   MCHC 31.1 30.0 - 36.0 g/dL   RDW 17.4 (H) 11.5 - 15.5 %   Platelets 124 (L) 150 - 400 K/uL  Type and screen [place]     Status: None (Preliminary result)   Collection Time: 09/18/16  5:15 AM  Result Value Ref Range   ABO/RH(D) O POS    Antibody Screen NEG    Sample Expiration 09/21/2016    Unit Number [id number]    Blood Component Type RED CELLS,LR    Unit division 00    Status of Unit ISSUED    Transfusion Status OK TO TRANSFUSE    Crossmatch Result Compatible    Unit Number [id number]    Blood Component Type RED CELLS,LR    Unit division 00    Status of Unit ALLOCATED    Transfusion Status OK TO TRANSFUSE    Crossmatch Result Compatible    Unit Number [id number]    Blood Component Type RED CELLS,LR    Unit division 00    Status of Unit ALLOCATED    Transfusion Status OK TO TRANSFUSE     Crossmatch Result Compatible    Unit Number [id number]    Blood Component Type RED CELLS,LR    Unit division 00    Status of Unit ALLOCATED  Transfusion Status OK TO TRANSFUSE    Crossmatch Result Compatible   Glucose, capillary     Status: None   Collection Time: 09/18/16  6:19 AM  Result Value Ref Range   Glucose-Capillary 86 65 - 99 mg/dL   Comment 1 Notify RN    Comment 2 Document in Chart   MRSA PCR Screening     Status: None   Collection Time: 09/18/16  6:30 AM  Result Value Ref Range   MRSA by PCR NEGATIVE NEGATIVE    Comment:        The GeneXpert MRSA Assay (FDA approved for NASAL specimens only), is one component of a comprehensive MRSA colonization surveillance program. It is not intended to diagnose MRSA infection nor to guide or monitor treatment for MRSA infections.   Vitamin B12     Status: Abnormal   Collection Time: 09/18/16  8:13 AM  Result Value Ref Range   Vitamin B-12 927 (H) 180 - 914 pg/mL    Comment: (NOTE) This assay is not validated for testing neonatal or myeloproliferative syndrome specimens for Vitamin B12 levels.   Folate     Status: None   Collection Time: 09/18/16  8:13 AM  Result Value Ref Range   Folate 17.9 >5.9 ng/mL  Iron and TIBC     Status: Abnormal   Collection Time: 09/18/16  8:13 AM  Result Value Ref Range   Iron 68 28 - 170 ug/dL   TIBC 218 (L) 250 - 450 ug/dL   Saturation Ratios 31 10.4 - 31.8 %   UIBC 150 ug/dL  Ferritin     Status: Abnormal   Collection Time: 09/18/16  8:13 AM  Result Value Ref Range   Ferritin 480 (H) 11 - 307 ng/mL  Reticulocytes     Status: Abnormal   Collection Time: 09/18/16  8:13 AM  Result Value Ref Range   Retic Ct Pct 6.5 (H) 0.4 - 3.1 %   RBC. 1.76 (L) 3.87 - 5.11 MIL/uL   Retic Count, Manual 114.4 19.0 - 186.0 K/uL  Prepare RBC     Status: None   Collection Time: 09/18/16  9:47 AM  Result Value Ref Range   Order Confirmation ORDER CONFIRMATION     ROS:  Some tingling,  sensation  of feet  And see hpi for other  positives   Physical Exam: Vitals:   09/18/16 1157 09/18/16 1330  BP:  112/75  Pulse:    Resp:    Temp: 98.4 F (36.9 C) 98.5 F (36.9 C)     General: alert AAF , NAD , eating lunch, OX3  HEENT: [place] , Right lower orbital area bruise , eomi , mmm  Neck: no jvd, supple Heart: Tachy, regular,, no mur, rub, or gallop Lungs: CTA  Abdomen: obese, soft, NT. ND Extremities: no pedal edema  Skin: Right orbital bruising , no overt rash , warm dry  Neuro: alert OX4 , no overt focal deficits Dialysis Access: POs bruit R  FA VAF  Dialysis Orders: Center: [facility]   on MWF . EDW 70.5 kg HD Bath 2k, 2.0ca  Time 4hr 77mn Heparin 5400. Access RFA AVF     Calcitriol 1.5  Mcg po /HD  Mircera 150 mcg q 2wks (last on 09/19/2016)      Other op labs hgb 9.9  (09/11/16)   Ca 10,3 phos 7.4  pth 268   Assessment/Plan 1. Pna - per admit, LLL infiltrate on CXR 2. Anemia /Pancytopenia (plt 124) - wu per  admit , 2 u prbcs infusing  Slowly now , (last esa yest ,thus Aranesp not due in 2 weeks ) fu hgb trend  3. ESRD -  HDF MWF no hd needs today  4. /Hypotension/ volume  -  bp up to 112/75 now  5. Metabolic bone disease -  renvela binder. Po calcitriol on HD / Sensipar '30mg'$  qday 6. Lupus -   Ernest Haber, PA-C [place] [phone] 09/18/2016, 1:52 PM   Pt seen, examined, agree w assess/plan as above with additions as indicated. ESRD pt with SLE, HTN, hx CVA presenting with SOB at home yesterday, high HR at HD earlier yest.   W/U shows LLL PNA, on IV abx.  Plan HD tomorrow.  Kelly Splinter MD [employer] pager [phone]    cell [phone] 09/18/2016, 4:31 PM

## 2016-09-18 NOTE — Progress Notes (Signed)
PHARMACY - PHYSICIAN COMMUNICATION CRITICAL VALUE ALERT - BLOOD CULTURE IDENTIFICATION (BCID)  Results for orders placed or performed during the hospital encounter of 09/03/2016  Blood Culture ID Panel (Reflexed) (Collected: 09/23/2016  8:20 PM)  Result Value Ref Range   Enterococcus species NOT DETECTED NOT DETECTED   Listeria monocytogenes NOT DETECTED NOT DETECTED   Staphylococcus species DETECTED (A) NOT DETECTED   Staphylococcus aureus NOT DETECTED NOT DETECTED   Methicillin resistance DETECTED (A) NOT DETECTED   Streptococcus species NOT DETECTED NOT DETECTED   Streptococcus agalactiae NOT DETECTED NOT DETECTED   Streptococcus pneumoniae NOT DETECTED NOT DETECTED   Streptococcus pyogenes NOT DETECTED NOT DETECTED   Acinetobacter baumannii NOT DETECTED NOT DETECTED   Enterobacteriaceae species NOT DETECTED NOT DETECTED   Enterobacter cloacae complex NOT DETECTED NOT DETECTED   Escherichia coli NOT DETECTED NOT DETECTED   Klebsiella oxytoca NOT DETECTED NOT DETECTED   Klebsiella pneumoniae NOT DETECTED NOT DETECTED   Proteus species NOT DETECTED NOT DETECTED   Serratia marcescens NOT DETECTED NOT DETECTED   Haemophilus influenzae NOT DETECTED NOT DETECTED   Neisseria meningitidis NOT DETECTED NOT DETECTED   Pseudomonas aeruginosa NOT DETECTED NOT DETECTED   Candida albicans NOT DETECTED NOT DETECTED   Candida glabrata NOT DETECTED NOT DETECTED   Candida krusei NOT DETECTED NOT DETECTED   Candida parapsilosis NOT DETECTED NOT DETECTED   Candida tropicalis NOT DETECTED NOT DETECTED    Name of physician (or Provider) Contacted: Dr. 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 Discussed with Dr. Quincy Simmonds and will restart Vancomycin at this time.   Lawson Radar 09/18/2016  6:32 PM

## 2016-09-18 NOTE — Care Management Note (Signed)
Case Management Note  Patient Details  Name: MELODEY HOULTON MRN: [id number] Date of Birth: [birth year]  Subjective/Objective:  Pt admitted with shortness of breath post HD                Action/Plan:  PTA from home with children.  Hx of Lupus and ESRD on outpt HD.  CM will continue to follow for discharge needs   Expected Discharge Date:                  Expected Discharge Plan:  Home/Self Care  In-House Referral:     Discharge planning Services  CM Consult  Post Acute Care Choice:    Choice offered to:     DME Arranged:    DME Agency:     HH Arranged:    HH Agency:     Status of Service:  In process, will continue to follow  If discussed at Long Length of Stay Meetings, dates discussed:    Additional Comments:  Maryclare Labrador, RN 09/18/2016, 9:17 AM

## 2016-09-18 NOTE — Progress Notes (Signed)
NP paged by ED RN about low Hgb of 5.1. Hgb just 10 hrs prior was 11, so asked RN to r/p CBC. Next result, Hgb 5.2. Spoke to [place], Therapist, sports on 80M. Pt was inspected on arrival-no obvious bleeding, no bruising or hematomas noted, no c/o abd pain. MCV and MCHC are normal. Pt is alert, oriented. VSS. NP ordered anemia panel and will report to oncoming attending before ordering transfusion. ? Renal/lupus related vs blood loss but no obvious site.  KJKG, NP [facility]

## 2016-09-18 NOTE — Progress Notes (Addendum)
Pharmacy Antibiotic Note  [name] [name] is a 55 y.o. female admitted on 09/27/2016 with SOB concerning for HCAP and was originally started on Vancomycin + Zosyn - then stopped earlier today. The patient is noted to have respiratory distress requiring intubation this afternoon. BCID results this evening are now showing 2/3 BCx positive for likely MRSE and pharmacy has been consulted to resume Vancomycin dosing.   The patient is noted to be ESRD-MWF. The patient was loaded with Vancomycin 1500 mg on 11/15 and has not received any HD sessions yet this admit. Will assume to stay on normal MWF schedule.   Addendum: The patient is now being transitioned to CRRT. If up and running by midnight - it is estimated that the patient will be ready to start CRRT dosing in the early AM. Will obtain a VR to confirm level between 15-20 mcg/ml for re-dosing and then will plan to start Vancomycin 750 mg/24h while on CRRT.   Plan: 1. Hold Vancomycin for now - will obtain a VR on 11/17 AM to guide further doses 2. Will continue to follow HD schedule/duration, culture results, LOT, and antibiotic de-escalation plans   Height: 5\' 5"  (165.1 cm) Weight: 172 lb (78 kg) IBW/kg (Calculated) : 57  Temp (24hrs), Avg:98.6 F (37 C), Min:98 F (36.7 C), Max:100.8 F (38.2 C)   Recent Labs Lab 09/15/2016 1945 09/13/2016 1956 09/18/16 0008 09/18/16 0336 09/18/16 0515  WBC 2.3*  --   --   --  4.4  CREATININE 4.38*  --   --  5.12*  --   LATICACIDVEN  --  1.42 0.55 0.9 0.6    Estimated Creatinine Clearance: 13 mL/min (by C-G formula based on SCr of 5.12 mg/dL (H)).    Allergies  Allergen Reactions  . Sulfa Antibiotics Itching    Antimicrobials this admission: Vanc 11/15 >> Zosyn 11/15 >> 11/16  Dose adjustments this admission: n/a  Microbiology results: 11/15 BCx >> GPC in clusters with BCID showing likely MRSE 11/15 UCx >> pending  Thank you for allowing pharmacy to be a part of this patient's  care.  Thank you for allowing pharmacy to be a part of this patient's care.  Alycia Rossetti, PharmD, BCPS Clinical Pharmacist Pager: [phone] 09/18/2016 6:25 PM

## 2016-09-18 NOTE — ED Notes (Signed)
Pt made aware of bed assignment 

## 2016-09-18 NOTE — Significant Event (Addendum)
[facility]   Called by nurse due to patient in respiratory distress. Patient just finished a transfusion of 2 units of PRBC, and about 30 minutes patient became with tachypnea and increase in WOB. Patient was keeping her oxygen saturation well.   Patient was seen and examined - patient complained of difficulty, feeling that she was going to drown. Patient on tripod position and breathing fast. Subsequently patient became unconscious and rapid response was called. Patient is ESRD on HD who produce minimal urine daily. Patient received Lasix between transfusions, without any significant output.   On lung exam patient was have diffuse crackles, CV Tachy with JVD.   Patient was intubated by PCCM, IV Lasix were given, Foley cath placed.   Acute respiratory with hypoxia 2/2 to flash pulm edema Transfer to ICU - PCCM help greatly appreciated  Will need emergent dialysis - Renal md notified  Continue Vent - keep sedated  Frequent aspiration as needed  Continue care per PCCM  Get ABG Check CXR    18:12 Received call by pharmacy, blood cx positive for gram positive cocci in clusters, resume Vanco renally dose per pharmacy.   Family updated, plan discussed in details  I spend additional 35 minutes on critical care for this patient.   Chipper Oman, MD

## 2016-09-18 NOTE — Progress Notes (Signed)
Patient c/o R toes tingling. MD aware.

## 2016-09-18 NOTE — ED Notes (Signed)
Received call from lab regarding critical lab of 5.2 hgb, while transporting pt upstairs to 51M.  Informed Juliette Mangle of lab and need to inform MD.

## 2016-09-18 NOTE — ED Notes (Signed)
Lab called w/ critical lab of trop 0.04.

## 2016-09-18 NOTE — Progress Notes (Addendum)
PROGRESS NOTE [facility]   [name] [name]   [id number] DOB: 01-28-1961  DOA: 09/15/2016 PCP: Lamar Blinks, MD   Brief Narrative:  55 year old female with medical history of end-stage renal disease on hemodialysis, lupus, CVA in 2016, CAD, hypertension presented to the ED with weakness and dizziness. Symptoms began with shortness of breath while on hemodialysis. Patient was given oxygen and shortness of breath resolved but then she was home and feel weak and dizzy. She was found to have tachycardia and subjective fever. She was place distended and units for questionable HCAP. On lab work in the morning was found to have hemoglobin of 5.1.   Subjective: Patient seen and examined this morning. Complaining of shortness of breath and weakness. Associated with left-sided foot tingling. This morning was found to have hemoglobin of 5.1. Repeat was 5.4. Patient's denies any gross bleeding noted hematemesis, no vaginal bleeding no bowel movement with blood or blood in the urine.   Assessment & Plan: Symptomatic anemia, patient normally Hb baseline between 9 and 10. Initial CBC in the ED shows hemoglobin of 11.9 CBC in the morning shows hemoglobin of 5.1 drop of almost 6. of hemoglobin with no gross bleeding seems to be the initial hemoglobin was lab error. With that amount of hemoglobin drop he will expect to see significant changes. Patient has no symptoms other than the initial presentation, this could explain why patient was admitted with tachycardia weakness and shortness of breath. -Transfuse 2 units units of PRBCs - give Lasix in between -Get FOB -Anemia panel - this is likely a component of CKD (anemia of chronic disease) -Repeat CBC post infusion -Hold anticoagulation -After confusion will give Venofer x 3 doses   Tachycardia - could be explained by severe anemia, sepsis continue to be unlikely physical exam normal all findings and chest x-ray, normal lactic acid and no  leukocytosis. Although pro-calcitonin elevated this could be reflective from end-stage renal disease. Patient has been afebrile since as admission -We will discontinue antibiotics - this could be secondary to viral syndrome -Follow-up blood and urine cultures -Continue to monitor on telemetry  End-stage renal disease on dialysis Nephrology consulted and appreciate recommendations  Prolonged QT Unknown etiology at this time Avoid QT prolonging agents Repeat EKG   Hypotension likely due to anemia Will treat anemia and monitor Hold on IV fluids for now given patient end-stage renal disease and will receive volume from blood   DVT prophylaxis: SCD's  Code Status: Full Family Communication: Family at bedside  Disposition Plan: Anticipate discharge home when medically stable    Consultants:   Nephrology   Procedures:   None   Antimicrobials:  Zosyn 11/15  Vanco 11/15    Objective: Vitals:   09/18/16 0515 09/18/16 0530 09/18/16 0700 09/18/16 0821  BP: 97/65 91/64 105/66   Pulse: 114 108 (!) 112   Resp: 22 14 12    Temp:    98.8 F (37.1 C)  TempSrc:    Oral  SpO2: 99% 99% 100%   Weight:      Height:        Intake/Output Summary (Last 24 hours) at 09/18/16 0834 Last data filed at 09/18/16 0600  Gross per 24 hour  Intake           1562.5 ml  Output                0 ml  Net           1562.5 ml   [employer]  09/11/2016 1940  Weight: 78 kg (172 lb)    Examination:  General exam: NAD, frail ill looking  Respiratory system: Clear to auscultation. No wheezes,crackle or rhonchi Cardiovascular system: Tachycardia @ 105, no murmurs or rubs  Gastrointestinal system: Abdomen is nondistended, soft and nontender Normal bowel sounds heard. Central nervous system: Alert and oriented. No focal neurological deficits. Extremities: No pedal edema.  Skin: No rashes or induration seen  Psychiatry: Judgement and insight appear normal. Mood & affect appropriate.    Data  Reviewed: I have personally reviewed following labs and imaging studies  CBC:  Recent Labs Lab 09/09/2016 1945 09/18/16 0515  WBC 2.3* 4.4  NEUTROABS 1.5*  --   HGB 11.9* 5.1*  HCT 37.6 16.4*  MCV 85.5 85.4  PLT 86* [id number]*   Basic Metabolic Panel:  Recent Labs Lab 09/22/2016 1945 09/18/16 0336  NA 136 139  K 3.7 4.0  CL 96* 101  CO2 27 30  GLUCOSE 110* 89  BUN 28* 33*  CREATININE 4.38* 5.12*  CALCIUM 8.6* 8.7*   GFR: Estimated Creatinine Clearance: 13 mL/min (by C-G formula based on SCr of 5.12 mg/dL (H)). Liver Function Tests:  Recent Labs Lab 09/04/2016 1945  AST 20  ALT 9*  ALKPHOS 55  BILITOT 0.5  PROT 6.0*  ALBUMIN 2.4*   No results for input(s): LIPASE, AMYLASE in the last 168 hours. No results for input(s): AMMONIA in the last 168 hours. Coagulation Profile:  Recent Labs Lab 09/18/16 0336  INR 1.01   Cardiac Enzymes: No results for input(s): CKTOTAL, CKMB, CKMBINDEX, TROPONINI in the last 168 hours. BNP (last 3 results) No results for input(s): PROBNP in the last 8760 hours. HbA1C: No results for input(s): HGBA1C in the last 72 hours. CBG:  Recent Labs Lab 09/18/16 0619  GLUCAP 86   Lipid Profile: No results for input(s): CHOL, HDL, LDLCALC, TRIG, CHOLHDL, LDLDIRECT in the last 72 hours. Thyroid Function Tests: No results for input(s): TSH, T4TOTAL, FREET4, T3FREE, THYROIDAB in the last 72 hours. Anemia Panel: No results for input(s): VITAMINB12, FOLATE, FERRITIN, TIBC, IRON, RETICCTPCT in the last 72 hours. Sepsis Labs:  Recent Labs Lab 09/07/2016 1956 09/18/16 0008 09/18/16 0336 09/18/16 0515  PROCALCITON  --   --  2.62  --   LATICACIDVEN 1.42 0.55 0.9 0.6    No results found for this or any previous visit (from the past 240 hour(s)).    Radiology Studies: Ct Head Wo Contrast  Result Date: 09/13/2016 CLINICAL DATA:  Multiple falls over the past few days. Does not recall if she struck her head today. EXAM: CT HEAD WITHOUT  CONTRAST TECHNIQUE: Contiguous axial images were obtained from the base of the skull through the vertex without intravenous contrast. COMPARISON:  01/28/2015 FINDINGS: Brain: No evidence of acute infarction, hemorrhage, hydrocephalus, extra-axial collection or mass lesion/mass effect. There is mild generalized atrophy. There is white matter hypodensity which is chronic and likely due to small vessel ischemic disease. Vascular: No hyperdense vessel or unexpected calcification. Skull: Normal. Negative for fracture or focal lesion. Sinuses/Orbits: No acute finding. Other: None. IMPRESSION: No acute intracranial findings. There is mild generalized atrophy and chronic appearing white matter hypodensities which likely represent small vessel ischemic disease. Electronically Signed   By: Andreas [facility] M.D.   On: 09/09/2016 22:37   Dg Chest Port 1 View  Result Date: 09/03/2016 CLINICAL DATA:  Shortness of breath and confusion for 2 days. EXAM: PORTABLE CHEST 1 VIEW COMPARISON:  Radiographs 01/07/2016 FINDINGS: Bilateral lobe airspace  opacity and pleural effusions, left greater than right. Heart appears mildly enlarged. There is vascular congestion and possible mild central edema. No pneumothorax. Rounded calcific density in the region of the coracoclavicular ligament is unchanged from prior exams and chronic. IMPRESSION: Findings suspicious for fluid overload/ CHF with vascular congestion, pleural effusions, and possible mild central pulmonary edema. Electronically Signed   By: Jeb Levering M.D.   On: 09/07/2016 20:09     Scheduled Meds: . cinacalcet  30 mg Oral Q breakfast  . multivitamin  1 tablet Oral Daily  . nortriptyline  10 mg Oral QHS  . piperacillin-tazobactam (ZOSYN)  IV  3.375 g Intravenous Q12H  . pravastatin  20 mg Oral Daily  . pregabalin  75 mg Oral BID  . sevelamer carbonate  4,800 mg Oral TID WC  . sodium chloride flush  3 mL Intravenous Q12H  . thiamine  100 mg Oral Daily    Continuous Infusions:   LOS: 1 day    Chipper Oman, MD [facility] Pager [phone]  If 7PM-7AM, please contact night-coverage [email or web address] Password [facility] 09/18/2016, 8:34 AM

## 2016-09-18 NOTE — ED Notes (Signed)
RN informed MD Baltazar Najjar regarding hgb, She will put order in

## 2016-09-18 NOTE — Procedures (Signed)
Intubation Procedure Note ADALINE SEGO [id number] 09/09/1961  Procedure: Intubation Indications: Respiratory insufficiency  Procedure Details Consent: Unable to obtain consent because of emergent medical necessity. Time Out: Verified patient identification, verified procedure, site/side was marked, verified correct patient position, special equipment/implants available, medications/allergies/relevent history reviewed, required imaging and test results available.  Performed  Maximum sterile technique was used including gloves, gown and hand hygiene.  MAC and 3    Evaluation Hemodynamic Status: Transient hypertension requiring treatment; O2 sats: stable throughout and currently acceptable Patient's Current Condition: stable Complications: No apparent complications Patient did tolerate procedure well. Chest X-ray ordered to verify placement.  CXR: pending.   Baltazar Apo, MD, PhD 09/18/2016, 5:41 PM [facility] [phone] or if no answer [phone]

## 2016-09-18 NOTE — Procedures (Signed)
Hemodialysis Catheter Insertion Procedure Note [name] [name] [id number] 1961/08/18  Procedure: Insertion of Hemodialysis Catheter Indications: CRRT  Procedure Details Consent: Risks of procedure as well as the alternatives and risks of each were explained to the (patient/caregiver).  Consent for procedure obtained. Time Out: Verified patient identification, verified procedure, site/side was marked, verified correct patient position, special equipment/implants available, medications/allergies/relevent history reviewed, required imaging and test results available.  Performed  Maximum sterile technique was used including antiseptics, cap, gloves, gown, hand hygiene, mask and sheet. Skin prep: Chlorhexidine; local anesthetic administered A antimicrobial bonded/coated triple lumen catheter was placed in the right internal jugular vein using the Seldinger technique.  Evaluation Blood flow good Complications: No apparent complications Patient did tolerate procedure well. Chest X-ray ordered to verify placement.  CXR: pending.  Procedure performed under direct ultrasound guidance for real time vessel cannulation.      Montey Hora, [facility] Pgr: [phone]  or [phone] 09/18/2016, 11:14 PM

## 2016-09-18 NOTE — Progress Notes (Signed)
Patient c/o increased SOB and new chest tightness. MD paged. MD to come look at patient shortly.

## 2016-09-19 ENCOUNTER — Telehealth: Payer: Self-pay | Admitting: Cardiovascular Disease

## 2016-09-19 ENCOUNTER — Inpatient Hospital Stay (HOSPITAL_COMMUNITY): Payer: Managed Care, Other (non HMO)

## 2016-09-19 ENCOUNTER — Encounter (HOSPITAL_COMMUNITY): Payer: Self-pay | Admitting: Internal Medicine

## 2016-09-19 LAB — TROPONIN I
TROPONIN I: 0.25 ng/mL — AB (ref <0.03)
TROPONIN I: 0.2 ng/mL — AB (ref <0.03)
Troponin I: 0.4 ng/mL (ref <0.03)
Troponin I: 0.32 ng/mL (ref <0.03)

## 2016-09-19 LAB — RETICULOCYTES
RBC.: 2.77 MIL/uL — AB (ref 3.87–5.11)
RETIC CT PCT: 3.9 % — AB (ref 0.4–3.1)
Retic Count, Absolute: 108 10*3/uL (ref 19.0–186.0)

## 2016-09-19 LAB — RENAL FUNCTION PANEL
ALBUMIN: 2.2 g/dL — AB (ref 3.5–5.0)
ANION GAP: 8 (ref 5–15)
ANION GAP: 6 (ref 5–15)
Albumin: 2.2 g/dL — ABNORMAL LOW (ref 3.5–5.0)
BUN: 37 mg/dL — ABNORMAL HIGH (ref 6–20)
BUN: 23 mg/dL — ABNORMAL HIGH (ref 6–20)
CALCIUM: 8.7 mg/dL — AB (ref 8.9–10.3)
CALCIUM: 8.4 mg/dL — AB (ref 8.9–10.3)
CHLORIDE: 101 mmol/L (ref 101–111)
CO2: 28 mmol/L (ref 22–32)
CO2: 27 mmol/L (ref 22–32)
Chloride: 104 mmol/L (ref 101–111)
Creatinine, Ser: 4.84 mg/dL — ABNORMAL HIGH (ref 0.44–1.00)
Creatinine, Ser: 3.41 mg/dL — ABNORMAL HIGH (ref 0.44–1.00)
GFR calc Af Amer: 16 mL/min — ABNORMAL LOW (ref >60)
GFR calc non Af Amer: 9 mL/min — ABNORMAL LOW (ref >60)
GFR calc non Af Amer: 14 mL/min — ABNORMAL LOW (ref >60)
GFR, EST AFRICAN AMERICAN: 11 mL/min — AB (ref >60)
GLUCOSE: 129 mg/dL — AB (ref 65–99)
Glucose, Bld: 107 mg/dL — ABNORMAL HIGH (ref 65–99)
PHOSPHORUS: 3 mg/dL (ref 2.5–4.6)
POTASSIUM: 4.2 mmol/L (ref 3.5–5.1)
Phosphorus: 3.8 mg/dL (ref 2.5–4.6)
Potassium: 4.4 mmol/L (ref 3.5–5.1)
SODIUM: 137 mmol/L (ref 135–145)
SODIUM: 137 mmol/L (ref 135–145)

## 2016-09-19 LAB — BILIRUBIN, FRACTIONATED(TOT/DIR/INDIR)
BILIRUBIN INDIRECT: 0.6 mg/dL (ref 0.3–0.9)
Bilirubin, Direct: 0.1 mg/dL (ref 0.1–0.5)
Total Bilirubin: 0.7 mg/dL (ref 0.3–1.2)

## 2016-09-19 LAB — GLUCOSE, CAPILLARY
GLUCOSE-CAPILLARY: 88 mg/dL (ref 65–99)
GLUCOSE-CAPILLARY: 31 mg/dL — AB (ref 65–99)
GLUCOSE-CAPILLARY: 80 mg/dL (ref 65–99)
GLUCOSE-CAPILLARY: 95 mg/dL (ref 65–99)
GLUCOSE-CAPILLARY: 117 mg/dL — AB (ref 65–99)
Glucose-Capillary: 130 mg/dL — ABNORMAL HIGH (ref 65–99)
Glucose-Capillary: 139 mg/dL — ABNORMAL HIGH (ref 65–99)

## 2016-09-19 LAB — BASIC METABOLIC PANEL
ANION GAP: 8 (ref 5–15)
BUN: 37 mg/dL — ABNORMAL HIGH (ref 6–20)
CO2: 28 mmol/L (ref 22–32)
Calcium: 8.6 mg/dL — ABNORMAL LOW (ref 8.9–10.3)
Chloride: 100 mmol/L — ABNORMAL LOW (ref 101–111)
Creatinine, Ser: 4.95 mg/dL — ABNORMAL HIGH (ref 0.44–1.00)
GFR calc Af Amer: 11 mL/min — ABNORMAL LOW (ref >60)
GFR calc non Af Amer: 9 mL/min — ABNORMAL LOW (ref >60)
GLUCOSE: 105 mg/dL — AB (ref 65–99)
POTASSIUM: 4.2 mmol/L (ref 3.5–5.1)
Sodium: 136 mmol/L (ref 135–145)

## 2016-09-19 LAB — MAGNESIUM
MAGNESIUM: 2.1 mg/dL (ref 1.7–2.4)
Magnesium: 2.1 mg/dL (ref 1.7–2.4)
Magnesium: 2.2 mg/dL (ref 1.7–2.4)

## 2016-09-19 LAB — CBC
HEMATOCRIT: 23.1 % — AB (ref 36.0–46.0)
HEMOGLOBIN: 7.5 g/dL — AB (ref 12.0–15.0)
MCH: 27.7 pg (ref 26.0–34.0)
MCHC: 32.5 g/dL (ref 30.0–36.0)
MCV: 85.2 fL (ref 78.0–100.0)
Platelets: 121 10*3/uL — ABNORMAL LOW (ref 150–400)
RBC: 2.71 MIL/uL — AB (ref 3.87–5.11)
RDW: 15.9 % — ABNORMAL HIGH (ref 11.5–15.5)
WBC: 7.2 10*3/uL (ref 4.0–10.5)

## 2016-09-19 LAB — LACTATE DEHYDROGENASE: LDH: 211 U/L — AB (ref 98–192)

## 2016-09-19 LAB — PHOSPHORUS
Phosphorus: 3 mg/dL (ref 2.5–4.6)
Phosphorus: 2.9 mg/dL (ref 2.5–4.6)

## 2016-09-19 LAB — VANCOMYCIN, RANDOM: Vancomycin Rm: 21

## 2016-09-19 MED ORDER — FAMOTIDINE IN NACL 20-0.9 MG/50ML-% IV SOLN
20.0000 mg | INTRAVENOUS | Status: DC
  Administered 2016-09-19 – 2016-09-23 (×5): 20 mg via INTRAVENOUS
  Filled 2016-09-19 (×6): qty 50

## 2016-09-19 MED ORDER — NORTRIPTYLINE HCL 10 MG PO CAPS
10.0000 mg | ORAL_CAPSULE | Freq: Every day | ORAL | Status: DC
  Administered 2016-09-19 – 2016-09-22 (×4): 10 mg
  Filled 2016-09-19 (×5): qty 1

## 2016-09-19 MED ORDER — METHYLPREDNISOLONE SODIUM SUCC 40 MG IJ SOLR
40.0000 mg | Freq: Two times a day (BID) | INTRAMUSCULAR | Status: DC
  Administered 2016-09-19 – 2016-09-20 (×4): 40 mg via INTRAVENOUS
  Filled 2016-09-19 (×5): qty 1

## 2016-09-19 MED ORDER — NORTRIPTYLINE HCL 10 MG/5ML PO SOLN
10.0000 mg | Freq: Every day | ORAL | Status: DC
  Filled 2016-09-19: qty 5

## 2016-09-19 MED ORDER — DEXTROSE 50 % IV SOLN
25.0000 mL | Freq: Once | INTRAVENOUS | Status: AC
  Administered 2016-09-19: 25 mL via INTRAVENOUS
  Filled 2016-09-19: qty 50

## 2016-09-19 MED ORDER — VITAL HIGH PROTEIN PO LIQD: 1000.0000 mL | ORAL | Status: DC

## 2016-09-19 MED ORDER — DIPHENHYDRAMINE HCL 50 MG/ML IJ SOLN
25.0000 mg | Freq: Four times a day (QID) | INTRAMUSCULAR | Status: DC | PRN
  Administered 2016-09-20 (×2): 25 mg via INTRAVENOUS
  Filled 2016-09-19 (×2): qty 1

## 2016-09-19 MED ORDER — DEXTROSE 50 % IV SOLN
INTRAVENOUS | Status: AC
  Filled 2016-09-19: qty 50

## 2016-09-19 MED ORDER — RENA-VITE PO TABS
1.0000 | ORAL_TABLET | Freq: Every day | ORAL | Status: DC
  Administered 2016-09-20 – 2016-09-22 (×3): 1 via ORAL
  Filled 2016-09-19 (×4): qty 1

## 2016-09-19 MED ORDER — DIPHENHYDRAMINE HCL 25 MG PO CAPS: 25.0000 mg | ORAL_CAPSULE | Freq: Three times a day (TID) | ORAL | Status: DC | PRN

## 2016-09-19 MED ORDER — DIPHENHYDRAMINE HCL 50 MG/ML IJ SOLN
INTRAMUSCULAR | Status: AC
  Administered 2016-09-19: 25 mg via INTRAVENOUS
  Filled 2016-09-19: qty 1

## 2016-09-19 MED ORDER — DIPHENHYDRAMINE HCL 50 MG/ML IJ SOLN
25.0000 mg | Freq: Three times a day (TID) | INTRAMUSCULAR | Status: DC | PRN
  Administered 2016-09-19: 25 mg via INTRAVENOUS

## 2016-09-19 MED ORDER — VANCOMYCIN HCL IN DEXTROSE 750-5 MG/150ML-% IV SOLN
750.0000 mg | INTRAVENOUS | Status: DC
  Administered 2016-09-19 – 2016-09-20 (×2): 750 mg via INTRAVENOUS
  Filled 2016-09-19 (×2): qty 150

## 2016-09-19 MED ORDER — VITAL AF 1.2 CAL PO LIQD
1000.0000 mL | ORAL | Status: DC
  Administered 2016-09-19 – 2016-09-22 (×4): 1000 mL

## 2016-09-19 MED FILL — Medication: Qty: 1 | Status: AC

## 2016-09-19 NOTE — Care Management Note (Signed)
Case Management Note  Patient Details  Name: [name] [name] MRN: [id number] Date of Birth: November 16, 1960  Subjective/Objective:  Pt admitted with shortness of breath post HD                Action/Plan:  PTA from home with children.  Hx of Lupus and ESRD on outpt HD.  CM will continue to follow for discharge needs   Expected Discharge Date:                  Expected Discharge Plan:  Home/Self Care  In-House Referral:     Discharge planning Services  CM Consult  Post Acute Care Choice:    Choice offered to:     DME Arranged:    DME Agency:     HH Arranged:    HH Agency:     Status of Service:  In process, will continue to follow  If discussed at Long Length of Stay Meetings, dates discussed:    Additional Comments: 09/19/2016 Pt remains on ventilator Maryclare Labrador, RN 09/19/2016, 2:43 PM

## 2016-09-19 NOTE — Progress Notes (Signed)
[facility] Progress Note   Subjective: went into resp distress with prbc's yest, now intubated on vent, soft BP, no pressors, CXR pulm edema yest  Vitals:   09/19/16 1130 09/19/16 1136 09/19/16 1200 09/19/16 1201  BP: (!) 104/45 (!) 104/45 (!) 93/46   Pulse: 94 (!) 111 97   Resp: 20 (!) 30 20   Temp:    97.7 F (36.5 C)  TempSrc:    Oral  SpO2: 100% 100% 100%   Weight:      Height:        Inpatient medications: . sodium chloride   Intravenous Once  . calcitRIOL  1.5 mcg Oral Q M,W,F-HD  . chlorhexidine gluconate (MEDLINE KIT)  15 mL Mouth Rinse BID  . cinacalcet  30 mg Oral Q breakfast  . famotidine (PEPCID) IV  20 mg Intravenous Q24H  . fentaNYL (SUBLIMAZE) injection  50 mcg Intravenous Once  . insulin aspart  2-6 Units Subcutaneous Q4H  . mouth rinse  15 mL Mouth Rinse QID  . methylPREDNISolone (SOLU-MEDROL) injection  40 mg Intravenous Q12H  . multivitamin  1 tablet Oral Daily  . nortriptyline  10 mg Oral QHS  . pravastatin  20 mg Oral Daily  . pregabalin  75 mg Oral QHS  . sevelamer carbonate  4,800 mg Oral TID WC  . sodium chloride flush  3 mL Intravenous Q12H  . thiamine  100 mg Oral Daily  . vancomycin  750 mg Intravenous Q24H   . feeding supplement (VITAL AF 1.2 CAL) 1,000 mL (09/19/16 1231)  . fentaNYL infusion INTRAVENOUS 50 mcg/hr (09/19/16 1100)  . norepinephrine (LEVOPHED) Adult infusion    . dialysis replacement fluid (prismasate) 200 mL/hr at 09/18/16 2357  . dialysis replacement fluid (prismasate) 300 mL/hr at 09/18/16 2358  . dialysate (PRISMASATE) 2,000 mL/hr at 09/19/16 1244   acetaminophen **OR** acetaminophen, calcium carbonate (dosed in mg elemental calcium), fentaNYL, heparin, midazolam, midazolam, sevelamer carbonate, sodium chloride, sorbitol  Exam: General: intubated, sedated, low BPs Neck: no jvd, supple Heart: Tachy, regular,, no mur, rub, or gallop Lungs: CTA  Abdomen: obese, soft, NT. ND Extremities: no pedal edema   Skin: Right orbital bruising , no overt rash , warm dry  Neuro: sedated on vent Dialysis Access: Pos bruit RFA VAF  Dialysis Orders: Center: [facility]   on MWF . EDW 70.5 kg HD Bath 2k, 2.0ca  Time 4hr 26mn Heparin 5400. Access RFA AVF     Calcitriol 1.5  Mc[facility] po /HD  Mircera 150 mcg q 2wks (last on 09/12/2016)      Other op labs hgb 9.9  (09/11/16)   Ca 10,3 phos 7.4  pth 268   Assessment: 1. PNA - LLL on abx 2. VDRF - pulm edema superimposed 3. ESRD - on CRRT D#2 today 4. Anemia / ckd - just got ESA on 11/15, trasnfused prbc's yest  5. Hypotension - normal BP's as OP, not on bp medication 6. Metabolic bone disease -  renvela binder. Po calcitriol on HD / Sensipar '30mg'$  qday 7. Hx lupus  Plan - cont CRRT, ^UF 771 100 cc/hr   RKelly SplinterMD [facility] pager [phone]  09/19/2016, 12:45 PM    Recent Labs Lab 09/29/2016 1945 09/18/16 0336 09/19/16 0500  NA 136 139 136  137  K 3.7 4.0 4.2  4.2  CL 96* 101 100*  101  CO2 '27 30 28  28  '$ GLUCOSE 110* 89 105*  107*  BUN 28* 33* 37*  37*  CREATININE 4.38* 5.12* 4.95*  4.84*  CALCIUM 8.6* 8.7* 8.6*  8.7*  PHOS  --   --  3.8    Recent Labs Lab 09/29/2016 1945 09/19/16 0500  AST 20  --   ALT 9*  --   ALKPHOS 55  --   BILITOT 0.5  --   PROT 6.0*  --   ALBUMIN 2.4* 2.2*    Recent Labs Lab 09/30/2016 1945 09/18/16 0515 09/18/16 1926 09/19/16 0500  WBC 2.3* 4.4  --  7.2  NEUTROABS 1.5*  --   --   --   HGB 11.9* 5.1* 8.6* 7.5*  HCT 37.6 16.4* 26.2* 23.1*  MCV 85.5 85.4  --  85.2  PLT 86* 124*  --  121*   Iron/TIBC/Ferritin/ %Sat    Component Value Date/Time   IRON 68 09/18/2016 0813   TIBC 218 (L) 09/18/2016 0813   FERRITIN 480 (H) 09/18/2016 0813   IRONPCTSAT 31 09/18/2016 0813

## 2016-09-19 NOTE — Progress Notes (Signed)
CRRT fluid removal goal -75-100. Pt SBP in 70s. Backed off patient fluid removal rate. Currently only able to pull about -50/hr. Will increase fluid removal rate as patient tolerates.

## 2016-09-19 NOTE — Telephone Encounter (Signed)
Records received from [facility] on 10/02/16 with Dr Oval Linsey. Records given to [employer] (medical records) CN

## 2016-09-19 NOTE — Progress Notes (Signed)
PULMONARY / CRITICAL CARE MEDICINE   Name: [name] [name] MRN: [id number] DOB: Aug 23, 1961    ADMISSION DATE:  09/04/2016 CONSULTATION DATE:  11/16  REFERRING MD:  Dr. 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 She presented to [facility] ED 11/15 from HD with complaints of SOB and subjective feeling of face/neck swelling. She was evaluated in the ED with concern for PNA and was admitted to the hospitalists and treated with IV antibiotics.   She had some acute on chronic anemia 11/16 prompting the administration of 2 units PRBC on 11/16, after which, she developed worsening SOB ultimately resulting in respiratory arrest requiring emergent intubation. PCCM to assume care.  SIGNIFICANT EVENTS: 11/15 admit 11/16 intubated for respiratory distress   SUBJECTIVE:  No acute events overnight. Awake and responsive this morning.     VITAL SIGNS: BP (!) 107/40 (BP Location: Left Arm)   Pulse 91   Temp 97.4 F (36.3 C) (Oral)   Resp 20   Ht 5\' 5"  (1.651 m)   Wt 159 lb 6.3 oz (72.3 kg)   LMP 07/18/2012   SpO2 100%   BMI 26.52 kg/m   HEMODYNAMICS:    VENTILATOR SETTINGS: Vent Mode: PRVC FiO2 (%):  [60 %-100 %] 60 % Set Rate:  [16 bmp] 16 bmp Vt Set:  [460 mL] 460 mL PEEP:  [5 cmH20-10 cmH20] 5 cmH20 Plateau Pressure:  [23 cmH20-25 cmH20] 24 cmH20  INTAKE / OUTPUT: I/O last 3 completed shifts: In: 2106.4 [I.V.:78.9; Blood:335; Other:120; IV Piggyback:1572.5] Out: 444 [Urine:15; Other:429]  PHYSICAL EXAMINATION: General:  Obese female lying in bed in NAD  Neuro:  Follows commands. Nods to questions.  HEENT:  ETT in place, PERRL Cardiovascular:  RRR  Lungs:  Coarse crackles Abdomen:  Soft, non-distended Musculoskeletal:  No acute deformity. RUE AV fistula   LABS:  BMET  Recent Labs Lab 09/13/2016 1945 09/18/16 0336 09/19/16 0500  NA 136 139 136  137  K 3.7 4.0  4.2  4.2  CL 96* 101 100*  101  CO2 27 30 28  28   BUN 28* 33* 37*  37*  CREATININE 4.38* 5.12* 4.95*  4.84*  GLUCOSE 110* 89 105*  107*    Electrolytes  Recent Labs Lab 09/16/2016 1945 09/18/16 0336 09/19/16 0500  CALCIUM 8.6* 8.7* 8.6*  8.7*  MG  --   --  2.1  PHOS  --   --  3.8    CBC  Recent Labs Lab 09/20/2016 1945 09/18/16 0515 09/18/16 1926 09/19/16 0500  WBC 2.3* 4.4  --  7.2  HGB 11.9* 5.1* 8.6* 7.5*  HCT 37.6 16.4* 26.2* 23.1*  PLT 86* 124*  --  121*    Coag's  Recent Labs Lab 09/18/16 0336  APTT 29  INR 1.01    Sepsis Markers  Recent Labs Lab 09/18/16 0008 09/18/16 0336 09/18/16 0515  LATICACIDVEN 0.55 0.9 0.6  PROCALCITON  --  2.62  --     ABG  Recent Labs Lab 09/18/16 1843  PHART 7.331*  PCO2ART 54.2*  PO2ART 75.4*    Liver Enzymes  Recent Labs Lab 09/09/2016 1945 09/19/16 0500  AST 20  --   ALT 9*  --   ALKPHOS 55  --   BILITOT 0.5  --   ALBUMIN 2.4* 2.2*    Cardiac Enzymes No results for input(s): TROPONINI, PROBNP in the last 168 hours.  Glucose  Recent Labs Lab 09/18/16 0619 09/18/16 2054 09/18/16 2330 09/19/16 0327 09/19/16 0747  GLUCAP 86 116* 131* 88 31*    Imaging Dg Chest Portable 1 View  Result Date: 09/18/2016 CLINICAL DATA:  Central line placement.  Initial encounter. EXAM: PORTABLE CHEST 1 VIEW COMPARISON:  Chest radiograph performed earlier today at 6:29 p.m. FINDINGS: The patient's endotracheal tube is seen ending 2-3 cm above the carina. A right IJ line is noted ending about the mid SVC. Diffuse bilateral airspace opacification is again noted, with mild biapical sparing. This is concerning for pneumonia or pulmonary edema. A small left pleural effusion is noted. No pneumothorax is seen. The cardiomediastinal silhouette is borderline enlarged. No acute osseous abnormalities are seen. An external pacing pad is noted. IMPRESSION: 1. Endotracheal tube seen ending 2-3 cm above the carina. 2. Right  IJ line noted ending about the mid SVC. 3. Diffuse bilateral airspace opacification, with mild biapical sparing. This is concerning for pneumonia or pulmonary edema. Small left pleural effusion noted. 4. Borderline cardiomegaly. Electronically Signed   By: Garald Balding M.D.   On: 09/18/2016 23:44   Portable Chest Xray  Result Date: 09/18/2016 CLINICAL DATA:  Acute respiratory failure. EXAM: PORTABLE CHEST 1 VIEW COMPARISON:  09/23/2016. FINDINGS: Interval endotracheal tube in satisfactory position. Interval extensive patchy opacity in both lungs. Normal sized heart. Probable small right pleural effusion. Unremarkable bones. IMPRESSION: 1. Interval extensive bilateral pneumonia or alveolar pulmonary edema. 2. Probable small right pleural effusion. Electronically Signed   By: Claudie Revering M.D.   On: 09/18/2016 18:47     STUDIES:    CULTURES: Blood 11/15 >>>GPC in clusters, MRSE species   ANTIBIOTICS: Vanc 11/15 >>> Zosyn 11/15 > 11/16  LINES/TUBES: ETT 11/16 >>> 11/16 rt IJ>>>  DISCUSSION: 55 year old female with ESRD on HD admitted for PNA 11/15, however, CXR at that time, given current changes, more likely represented volume overload. She likely had incomplete HD 11/15 and got volume in the way of IVF, ABX, and blood products since being admitted. I suspect she suffered flash pulmonary edema and will require urgent HD tonight.   ASSESSMENT / PLAN:  PULMONARY A: Acute hypoxemic respiratory failure secondary to pulmonary edema TRALI- concern HCAP r/o  P:   Full vent support CXR VAP bundle HD for volume removal Empiric roids Keep rate  SBT attempts  CARDIOVASCULAR A:  H/o chronic hypotension, MI  P:  Telemetry monitoring Troponin, repeat  RENAL A:   ESRD on HD  P:   HD per nephrology Neg balance goals  GASTROINTESTINAL A:   ? GIB See heme P:   NPO for now Occult blood testing Ensure ppi bid  HEMATOLOGIC A:   Anemia (baseline Hgb 9-10) Lupus, r/o  hemolysis P:  S/p 2 units PRBC Repeat Hgb Transfuse for Hgb < 7 Assess hemolysis  INFECTIOUS A:   MRSE Bacteremia NO hardware in place  P:   Vancomycin  Follow WBC and fevers Repeat Blood Cultures  echo  ENDOCRINE A:   DM  P:   CBG monitoring and SSI  NEUROLOGIC A:   Acute encephalopathy  P:   RASS goal: -1 Fentanyl gtt   FAMILY  - Updates: no family at bedside   - Inter-disciplinary family meet or Palliative Care meeting due by:  11/24  Phill Myron, D.O. 09/19/2016, 8:27 AM PGY-2, [facility]   STAFF NOTE: Linwood Dibbles, MD FACP have personally reviewed patient's available data, including medical history, events of note,  physical examination and test results as part of my evaluation. I have discussed with resident/NP and other care providers such as pharmacist, RN and RRT. In addition, I personally evaluated patient and elicited key findings of: awake, alert, FC, lungs coarse, no edema, I do have concerns for more then just fluid overload with 2 units prbc, have concerns TRALI reaction as pcxr improved this am even without any volume off, would add empiric steroids low dsoe, follow clinical course, for cvvhd neg balance today, BP improved, can SBT today cpap 5 ps5, goal 2 hours, assess abg, would benefit from neg balanced prior to extubation however, mrse noted, repeat bc, keep vanc going, feed today likely if not weaning incredible, WUA, family is in room, also get echo, I updated family, as far as anemia, getting fob, but with lups would assess hemolysis The patient is critically ill with multiple organ systems failure and requires high complexity decision making for assessment and support, frequent evaluation and titration of therapies, application of advanced monitoring technologies and extensive interpretation of multiple databases.   Critical Care Time devoted to patient care services described in this note is 35 Minutes. This time  reflects time of care of this signee: Merrie Roof, MD FACP. This critical care time does not reflect procedure time, or teaching time or supervisory time of PA/NP/Med student/Med Resident etc but could involve care discussion time. Rest per NP/medical resident whose note is outlined above and that I agree with   Lavon Paganini. Titus Mould, MD, [place] Pgr: [facility] 09/19/2016 10:28 AM

## 2016-09-19 NOTE — Progress Notes (Signed)
  Note hypoglycemia.  Consider holding SQ insulin until blood sugar consistently greater than 180 mg/dL.   Thanks Adah Perl, RN, BC-ADM Inpatient Diabetes Coordinator Pager [phone] (8a-5p)

## 2016-09-19 NOTE — Progress Notes (Signed)
Pharmacy Antibiotic Note  [name] [name] is a 55 y.o. female admitted on 09/28/2016 with SOB concerning for HCAP and was originally started on Vancomycin + Zosyn - then d/c'd. The patient now intubated. BCID results this showing 2/2 BCx positive for possible MRSE. Pharmacy has been consulted to resume Vancomycin dosing.   The patient is ESRD- on HD MWF. The patient was loaded with Vancomycin 1500 mg on 11/15 and last received HD 11/15 (PTA). Transitioned to CRRT at midnight, no interruptions overnight. Vanc random this AM 21 mcg/mL. Will resume vancomycin with goal vancomycin trough goal 15-20 mcg/mL.   Plan: 1. Start Vancomycin 750 mg q24hr 2. Goal trough 15-20 mcg/mL 3. Will continue to follow CRRT/HD schedule/duration, culture results, LOT, and antibiotic de-escalation plans   Height: 5\' 5"  (165.1 cm) Weight: 159 lb 6.3 oz (72.3 kg) IBW/kg (Calculated) : 57  Temp (24hrs), Avg:98.2 F (36.8 C), Min:97.4 F (36.3 C), Max:98.8 F (37.1 C)   Recent Labs Lab 09/12/2016 1945 09/18/2016 1956 09/18/16 0008 09/18/16 0336 09/18/16 0515 09/19/16 0500  WBC 2.3*  --   --   --  4.4 7.2  CREATININE 4.38*  --   --  5.12*  --  4.95*  4.84*  LATICACIDVEN  --  1.42 0.55 0.9 0.6  --   VANCORANDOM  --   --   --   --   --  21    Estimated Creatinine Clearance: 12.9 mL/min (by C-G formula based on SCr of 4.95 mg/dL (H)).    Allergies  Allergen Reactions  . Sulfa Antibiotics Itching    Antimicrobials this admission: Zosyn 11/15 >> 11/16 Vancomycin 11/15 >> 11/16, 11/17>> -VR 11/17 @ 0500 = 21 11/15 >> 11/16  Dose adjustments this admission: n/a  Microbiology results: 11/15 BCx: GPCs in clusters 2/2  11/15 BCID: staph, meth resistance  11/16 MRSA PCR: neg  Argie Ramming, PharmD Pharmacy Resident  Pager [phone] 09/19/16 9:05 AM

## 2016-09-19 NOTE — Progress Notes (Signed)
   09/19/16 1010  Clinical Encounter Type  Visited With Patient and family together  Visit Type Follow-up  Referral From Chaplain  Consult/Referral To Chaplain  Spiritual Encounters  Spiritual Needs Emotional  Stress Factors  Patient Stress Factors Not reviewed  Family Stress Factors Family relationships  Follow up to Pt and family. Make introduction. No needs at this time.

## 2016-09-19 NOTE — Progress Notes (Signed)
Initial Nutrition Assessment  INTERVENTION:  Initiate TF via OGT with Vital AF 1.2 at goal rate of 50 ml/h (1200 ml per day) to provide 1440 kcals, 90 gm protein, 972 ml free water daily.   NUTRITION DIAGNOSIS:   Inadequate oral intake related to inability to eat as evidenced by NPO status.   GOAL:   Patient will meet greater than or equal to 90% of their needs   MONITOR:   Vent status, TF tolerance, Labs, Weight trends, Skin, I & O's  REASON FOR ASSESSMENT:   Consult, Ventilator Enteral/tube feeding initiation and management  ASSESSMENT:   55 y.o. 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   She was given oxygen and then the SOB resolved.  Her relatives thought her face was swelling after HD and so they called 911.    Per chart review, pt has a history of severe malnutrition with weight dropping as low as 111 lbs > 1 year ago. Per weight history, pt has most consistently weighed 140 to 142 lbs this past year with weight increasing the past 3 months. Pt appears well nourished. CRRT running at time of visit. OGT in place.   Patient is currently intubated on ventilator support MV: 8 L/min Temp (24hrs), Avg:98.1 F (36.7 C), Min:97.4 F (36.3 C), Max:98.5 F (36.9 C)  Propofol: none  Diet Order:  Diet NPO time specified  Skin:  Reviewed, no issues  Last BM:  unknown  Height:   Ht Readings from Last 1 Encounters:  09/18/16 5\' 5"  (1.651 m)    Weight:   Wt Readings from Last 1 Encounters:  09/19/16 159 lb 6.3 oz (72.3 kg)    Ideal Body Weight:  56.8 kg  BMI:  Body mass index is 26.52 kg/m.  Estimated Nutritional Needs:   Kcal:  1410  Protein:  90-105 grams  Fluid:  1.6 L/day  EDUCATION NEEDS:   No education needs identified at this time  [place], CSP, LDN Inpatient Clinical Dietitian Pager:  [phone] After Hours Pager: [phone]

## 2016-09-20 ENCOUNTER — Inpatient Hospital Stay (HOSPITAL_COMMUNITY): Payer: Managed Care, Other (non HMO)

## 2016-09-20 DIAGNOSIS — J9600 Acute respiratory failure, unspecified whether with hypoxia or hypercapnia: Secondary | ICD-10-CM

## 2016-09-20 LAB — PHOSPHORUS
Phosphorus: 2.7 mg/dL (ref 2.5–4.6)
Phosphorus: 2.4 mg/dL — ABNORMAL LOW (ref 2.5–4.6)

## 2016-09-20 LAB — RENAL FUNCTION PANEL
ALBUMIN: 2.5 g/dL — AB (ref 3.5–5.0)
ALBUMIN: 2.5 g/dL — AB (ref 3.5–5.0)
ANION GAP: 7 (ref 5–15)
ANION GAP: 8 (ref 5–15)
BUN: 18 mg/dL (ref 6–20)
BUN: 19 mg/dL (ref 6–20)
CHLORIDE: 103 mmol/L (ref 101–111)
CHLORIDE: 102 mmol/L (ref 101–111)
CO2: 27 mmol/L (ref 22–32)
CO2: 25 mmol/L (ref 22–32)
Calcium: 9.2 mg/dL (ref 8.9–10.3)
Calcium: 9.2 mg/dL (ref 8.9–10.3)
Creatinine, Ser: 2.11 mg/dL — ABNORMAL HIGH (ref 0.44–1.00)
Creatinine, Ser: 1.78 mg/dL — ABNORMAL HIGH (ref 0.44–1.00)
GFR calc Af Amer: 29 mL/min — ABNORMAL LOW (ref >60)
GFR calc Af Amer: 36 mL/min — ABNORMAL LOW (ref >60)
GFR calc non Af Amer: 25 mL/min — ABNORMAL LOW (ref >60)
GFR, EST NON AFRICAN AMERICAN: 31 mL/min — AB (ref >60)
GLUCOSE: 150 mg/dL — AB (ref 65–99)
Glucose, Bld: 189 mg/dL — ABNORMAL HIGH (ref 65–99)
PHOSPHORUS: 2.7 mg/dL (ref 2.5–4.6)
PHOSPHORUS: 2.4 mg/dL — AB (ref 2.5–4.6)
POTASSIUM: 4.8 mmol/L (ref 3.5–5.1)
POTASSIUM: 5.4 mmol/L — AB (ref 3.5–5.1)
Sodium: 137 mmol/L (ref 135–145)
Sodium: 135 mmol/L (ref 135–145)

## 2016-09-20 LAB — CBC
HCT: 22.4 % — ABNORMAL LOW (ref 36.0–46.0)
HEMOGLOBIN: 7.2 g/dL — AB (ref 12.0–15.0)
MCH: 28.1 pg (ref 26.0–34.0)
MCHC: 32.1 g/dL (ref 30.0–36.0)
MCV: 87.5 fL (ref 78.0–100.0)
PLATELETS: 156 10*3/uL (ref 150–400)
RBC: 2.56 MIL/uL — AB (ref 3.87–5.11)
RDW: 16.2 % — ABNORMAL HIGH (ref 11.5–15.5)
WBC: 8.2 10*3/uL (ref 4.0–10.5)

## 2016-09-20 LAB — CULTURE, BLOOD (ROUTINE X 2)

## 2016-09-20 LAB — GLUCOSE, CAPILLARY
GLUCOSE-CAPILLARY: 139 mg/dL — AB (ref 65–99)
GLUCOSE-CAPILLARY: 159 mg/dL — AB (ref 65–99)
GLUCOSE-CAPILLARY: 83 mg/dL (ref 65–99)
Glucose-Capillary: 122 mg/dL — ABNORMAL HIGH (ref 65–99)
Glucose-Capillary: 110 mg/dL — ABNORMAL HIGH (ref 65–99)

## 2016-09-20 LAB — MAGNESIUM
MAGNESIUM: 2.6 mg/dL — AB (ref 1.7–2.4)
Magnesium: 2.5 mg/dL — ABNORMAL HIGH (ref 1.7–2.4)

## 2016-09-20 LAB — HAPTOGLOBIN: HAPTOGLOBIN: 100 mg/dL (ref 34–200)

## 2016-09-20 NOTE — Progress Notes (Signed)
PULMONARY / CRITICAL CARE MEDICINE   Name: [name] [name] MRN: [id number] DOB: 06-Jun-1961    ADMISSION DATE:  09/25/2016 CONSULTATION DATE:  11/16  REFERRING MD:  Dr. 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 She presented to [facility] ED 11/15 from HD with complaints of SOB and subjective feeling of face/neck swelling. She was evaluated in the ED with concern for PNA and was admitted to the hospitalists and treated with IV antibiotics.   She had some acute on chronic anemia 11/16 prompting the administration of 2 units PRBC on 11/16, after which, she developed worsening SOB ultimately resulting in respiratory arrest requiring emergent intubation. PCCM to assume care.  SIGNIFICANT EVENTS: 11/15 admit 11/16 intubated for respiratory distress   SUBJECTIVE:   Awake and responsive this morning.  F/c  Tried wean x 2 this am , increased wob back on full support  cxr is improved today .  On CRRT    VITAL SIGNS: BP 94/66   Pulse 72   Temp 97.5 F (36.4 C) (Oral)   Resp 20   Ht 5\' 5"  (1.651 m)   Wt 72.2 kg (159 lb 2.8 oz)   LMP 07/18/2012   SpO2 97%   BMI 26.49 kg/m   HEMODYNAMICS: CVP:  [2 mmHg-5 mmHg] 5 mmHg  VENTILATOR SETTINGS: Vent Mode: PRVC FiO2 (%):  [30 %-50 %] 30 % Set Rate:  [20 bmp] 20 bmp Vt Set:  [460 mL] 460 mL PEEP:  [5 cmH20] 5 cmH20 Plateau Pressure:  [19 cmH20-24 cmH20] 22 cmH20  INTAKE / OUTPUT: I/O last 3 completed shifts: In: 1745.8 [I.V.:485.8; Other:110; NG/GT:950; IV Piggyback:200] Out: [id number] [Urine:15; Other:3302]  PHYSICAL EXAMINATION: General:  Obese female lying in bed in NAD  Neuro:  Follows commands. Nods to questions.  HEENT:  ETT in place, PERRL Cardiovascular:  RRR  Lungs:  Coarse crackles Abdomen:  Soft, non-distended Musculoskeletal:  No acute deformity. RUE AV fistula   LABS:  BMET  Recent Labs Lab 09/19/16 0500  09/19/16 1627 09/20/16 0500  NA 136  137 137 137  K 4.2  4.2 4.4 4.8  CL 100*  101 104 103  CO2 28  28 27 27   BUN 37*  37* 23* 18  CREATININE 4.95*  4.84* 3.41* 2.11*  GLUCOSE 105*  107* 129* 150*    Electrolytes  Recent Labs Lab 09/19/16 0500 09/19/16 1230 09/19/16 1627 09/20/16 0500  CALCIUM 8.6*  8.7*  --  8.4* 9.2  MG 2.1 2.2 2.1 2.5*  PHOS 3.8 3.0 3.0  2.9 2.7  2.7    CBC  Recent Labs Lab 09/18/16 0515 09/18/16 1926 09/19/16 0500 09/20/16 0500  WBC 4.4  --  7.2 8.2  HGB 5.1* 8.6* 7.5* 7.2*  HCT 16.4* 26.2* 23.1* 22.4*  PLT 124*  --  121* 156    Coag's  Recent Labs Lab 09/18/16 0336  APTT 29  INR 1.01    Sepsis Markers  Recent Labs Lab 09/18/16 0008 09/18/16 0336 09/18/16 0515  LATICACIDVEN 0.55 0.9 0.6  PROCALCITON  --  2.62  --     ABG  Recent Labs Lab 09/18/16 1843  PHART 7.331*  PCO2ART 54.2*  PO2ART 75.4*    Liver Enzymes  Recent Labs Lab 09/05/2016 1945 09/19/16 0500 09/19/16 1027 09/19/16 1627 09/20/16 0500  AST 20  --   --   --   --  ALT 9*  --   --   --   --   ALKPHOS 55  --   --   --   --   BILITOT 0.5  --  0.7  --   --   ALBUMIN 2.4* 2.2*  --  2.2* 2.5*    Cardiac Enzymes  Recent Labs Lab 09/19/16 1027 09/19/16 1627 09/19/16 2137  TROPONINI 0.32* 0.25* 0.20*    Glucose  Recent Labs Lab 09/19/16 1156 09/19/16 1548 09/19/16 1949 09/19/16 2347 09/20/16 0331 09/20/16 0825  GLUCAP 80 95 117* 130* 139* 122*    Imaging Dg Chest Port 1 View  Result Date: 09/20/2016 CLINICAL DATA:  Acute respiratory failure EXAM: PORTABLE CHEST 1 VIEW COMPARISON:  09/19/2016 FINDINGS: Support devices remain in place, unchanged. Heart is mildly enlarged. Improving perihilar and lower lobe opacities, likely improving edema. Bibasilar atelectasis with small effusions remain. IMPRESSION: Improving pulmonary edema pattern. Continued bibasilar atelectasis with small effusions. Electronically Signed   By: Rolm Baptise M.D.   On: 09/20/2016 07:27     STUDIES:  CT head 11/15 neg for acute   CULTURES: Blood 11/15 >>>GPC in clusters, >Staph species (co ag neg )   ANTIBIOTICS: Vanc 11/15 >>> Zosyn 11/15 > 11/16  LINES/TUBES: ETT 11/16 >>> 11/16 rt IJ>>>  DISCUSSION: 55 year old female with ESRD on HD admitted for PNA 11/15, however, CXR at that time, given current changes, more likely represented volume overload. She likely had incomplete HD 11/15 and got volume in the way of IVF, ABX, and blood products since being admitted. I suspect she suffered flash pulmonary edema , cxr is improved after CRRT   ASSESSMENT / PLAN:  PULMONARY A: Acute hypoxemic respiratory failure secondary to pulmonary edema TRALI- concern HCAP r/o  P:   Full vent support Assess for daily wean/SBT  CXR VAP bundle HD for volume removal Empiric steroids   CARDIOVASCULAR A:  H/o chronic hypotension, MI  P:  Telemetry monitoring Troponin, repeat Wean pressors to keep MAP >65   RENAL A:   ESRD on HD  P:   HD per nephrology Neg balance goals  GASTROINTESTINAL A:   ? GIB See heme P:   NPO for now Occult blood testing pepcid IV   HEMATOLOGIC A:   Anemia (baseline Hgb 9-10) Lupus, r/o hemolysis P:  S/p 2 units PRBC Repeat Hgb Transfuse for Hgb < 7 Assess hemolysis  INFECTIOUS A:   MRSE Bacteremia NO hardware in place  P:   Vancomycin  Follow WBC and fevers Repeat Blood Cultures  echo  ENDOCRINE A:   DM  P:   CBG monitoring and SSI  NEUROLOGIC A:   Acute encephalopathy  P:   RASS goal: -1 to 0  Fentanyl gtt   FAMILY  - Updates: no family at bedside   - Inter-disciplinary family meet or Palliative Care meeting due by:  11/24  Tammy Parrett NP-C  [facility]  09/20/2016   STAFF NOTE: Linwood Dibbles, MD FACP have personally reviewed patient's available data, including medical history, events of note, physical examination and test  results as part of my evaluation. I have discussed with resident/NP and other care providers such as pharmacist, RN and RRT. In addition, I personally evaluated patient and elicited key findings of: awake, fc, less coarse BS, , pcxr much improved after neg 1.2 liters, likely has more to give, weaning cpap 5 ps higher needed, cvvhd tolerated, would keep steroids but reduce in am, did  have association with prbc, would want furtehr neg balance prior to extubation, she also has not been weaning well, neurostatus supports extubation when weaning successful The patient is critically ill with multiple organ systems failure and requires high complexity decision making for assessment and support, frequent evaluation and titration of therapies, application of advanced monitoring technologies and extensive interpretation of multiple databases.   Critical Care Time devoted to patient care services described in this note is 30 Minutes. This time reflects time of care of this signee: Merrie Roof, MD FACP. This critical care time does not reflect procedure time, or teaching time or supervisory time of PA/NP/Med student/Med Resident etc but could involve care discussion time. Rest per NP/medical resident whose note is outlined above and that I agree with   Lavon Paganini. Titus Mould, MD, [place] Pgr: [facility] 09/20/2016 2:36 PM

## 2016-09-20 NOTE — Progress Notes (Signed)
[facility] Progress Note   Subjective: I/O neg uF ~ 3L,  CXR entirely clear today Vitals:   09/20/16 1030 09/20/16 1100 09/20/16 1119 09/20/16 1130  BP: (!) 117/92  90/71 94/66  Pulse: 98 87  72  Resp: 20 (!) 22 (!) 22 20  Temp:      TempSrc:      SpO2: 100% 100%  97%  Weight:      Height:        Inpatient medications: . sodium chloride   Intravenous Once  . calcitRIOL  1.5 mcg Oral Q M,W,F-HD  . chlorhexidine gluconate (MEDLINE KIT)  15 mL Mouth Rinse BID  . cinacalcet  30 mg Oral Q breakfast  . famotidine (PEPCID) IV  20 mg Intravenous Q24H  . fentaNYL (SUBLIMAZE) injection  50 mcg Intravenous Once  . insulin aspart  2-6 Units Subcutaneous Q4H  . mouth rinse  15 mL Mouth Rinse QID  . methylPREDNISolone (SOLU-MEDROL) injection  40 mg Intravenous Q12H  . multivitamin  1 tablet Oral QHS  . nortriptyline  10 mg Per Tube QHS  . pravastatin  20 mg Oral Daily  . pregabalin  75 mg Oral QHS  . sevelamer carbonate  4,800 mg Oral TID WC  . sodium chloride flush  3 mL Intravenous Q12H  . thiamine  100 mg Oral Daily  . vancomycin  750 mg Intravenous Q24H   . feeding supplement (VITAL AF 1.2 CAL) 1,000 mL (09/20/16 1000)  . fentaNYL infusion INTRAVENOUS 50 mcg/hr (09/20/16 0918)  . norepinephrine (LEVOPHED) Adult infusion    . dialysis replacement fluid (prismasate) 200 mL/hr at 09/20/16 0056  . dialysis replacement fluid (prismasate) 300 mL/hr at 09/20/16 1011  . dialysate (PRISMASATE) 2,000 mL/hr at 09/20/16 1150   acetaminophen **OR** acetaminophen, calcium carbonate (dosed in mg elemental calcium), diphenhydrAMINE, fentaNYL, heparin, midazolam, midazolam, sevelamer carbonate, sodium chloride, sorbitol  Exam: General: intubated, sedated Neck: no jvd, supple Heart: no MRG RRR Lungs: CTA  Abdomen: obese, soft, NT. ND Extremities: no pedal edema  Skin: Right orbital bruising , no overt rash , warm dry  Neuro: sedated on vent Dialysis Access: Pos bruit RFA  VAF  Dialysis: [facility] MWF   4h 38mn  70.5kg   2/2 bath  Hep 5400   RFA AVF   Calcitriol 1.5  Mcg po /HD  Mircera 150 mcg q 2wks (last on 09/12/2016)      Other op labs hgb 9.9  (09/11/16)   Ca 10,3 phos 7.4  pth 268   Assessment: 1. PNA - LLL on abx 2. Sepsis/ MRSE bacteremia - on IV abx 3. VDRF - pulm edema gone 4. ESRD - on CRRT, usually on MWF hd 5. Anemia / ckd - just got ESA on 11/15, trasnfused prbc's 2u on 11/16 6. Hypotension - persists, normal BP's as OP, not on bp med at home 7. Metabolic bone disease -  renvela binder, Po calcitriol/ Sensipar 36mqday when eating 8. Hx lupus  Plan - dc CRRT later today. Next HD Monday.    RoKelly SplinterD [facility] pager [phone] 09/20/2016, 12:03 PM    Recent Labs Lab 09/19/16 0500 09/19/16 1230 09/19/16 1627 09/20/16 0500  NA 136  137  --  137 137  K 4.2  4.2  --  4.4 4.8  CL 100*  101  --  104 103  CO2 28  28  --  27 27  GLUCOSE 105*  107*  --  129* 150*  BUN 37*  37*  --  23* 18  CREATININE 4.95*  4.84*  --  3.41* 2.11*  CALCIUM 8.6*  8.7*  --  8.4* 9.2  PHOS 3.8 3.0 3.0  2.9 2.7  2.7    Recent Labs Lab 09/18/2016 1945 09/19/16 0500 09/19/16 1027 09/19/16 1627 09/20/16 0500  AST 20  --   --   --   --   ALT 9*  --   --   --   --   ALKPHOS 55  --   --   --   --   BILITOT 0.5  --  0.7  --   --   PROT 6.0*  --   --   --   --   ALBUMIN 2.4* 2.2*  --  2.2* 2.5*    Recent Labs Lab 09/20/2016 1945 09/18/16 0515 09/18/16 1926 09/19/16 0500 09/20/16 0500  WBC 2.3* 4.4  --  7.2 8.2  NEUTROABS 1.5*  --   --   --   --   HGB 11.9* 5.1* 8.6* 7.5* 7.2*  HCT 37.6 16.4* 26.2* 23.1* 22.4*  MCV 85.5 85.4  --  85.2 87.5  PLT 86* 124*  --  121* 156   Iron/TIBC/Ferritin/ %Sat    Component Value Date/Time   IRON 68 09/18/2016 0813   TIBC 218 (L) 09/18/2016 0813   FERRITIN 480 (H) 09/18/2016 0813   IRONPCTSAT 31 09/18/2016 0813

## 2016-09-20 NOTE — Progress Notes (Signed)
MD Titus Mould wants patient to continue CRRT over night. Notified MD [place]her[place] in regards to this request. MD Jonnie Finner is okay with and ordered for patient to continue CRRT but only remove 127ml/hr for patient fluid removal.

## 2016-09-21 ENCOUNTER — Inpatient Hospital Stay (HOSPITAL_COMMUNITY): Payer: Managed Care, Other (non HMO)

## 2016-09-21 ENCOUNTER — Encounter (HOSPITAL_COMMUNITY): Payer: Self-pay | Admitting: Cardiology

## 2016-09-21 DIAGNOSIS — I340 Nonrheumatic mitral (valve) insufficiency: Secondary | ICD-10-CM

## 2016-09-21 DIAGNOSIS — R748 Abnormal levels of other serum enzymes: Secondary | ICD-10-CM

## 2016-09-21 DIAGNOSIS — I5033 Acute on chronic diastolic (congestive) heart failure: Secondary | ICD-10-CM

## 2016-09-21 DIAGNOSIS — R7881 Bacteremia: Secondary | ICD-10-CM

## 2016-09-21 DIAGNOSIS — I339 Acute and subacute endocarditis, unspecified: Secondary | ICD-10-CM

## 2016-09-21 HISTORY — DX: Acute and subacute endocarditis, unspecified: I33.9

## 2016-09-21 HISTORY — DX: Nonrheumatic mitral (valve) insufficiency: I34.0

## 2016-09-21 LAB — RENAL FUNCTION PANEL
ALBUMIN: 2.6 g/dL — AB (ref 3.5–5.0)
ALBUMIN: 2.9 g/dL — AB (ref 3.5–5.0)
ANION GAP: 7 (ref 5–15)
Anion gap: 10 (ref 5–15)
BUN: 21 mg/dL — ABNORMAL HIGH (ref 6–20)
BUN: 22 mg/dL — AB (ref 6–20)
CALCIUM: 9.5 mg/dL (ref 8.9–10.3)
CHLORIDE: 103 mmol/L (ref 101–111)
CO2: 28 mmol/L (ref 22–32)
CO2: 26 mmol/L (ref 22–32)
CREATININE: 1.47 mg/dL — AB (ref 0.44–1.00)
Calcium: 9.6 mg/dL (ref 8.9–10.3)
Chloride: 101 mmol/L (ref 101–111)
Creatinine, Ser: 1.71 mg/dL — ABNORMAL HIGH (ref 0.44–1.00)
GFR calc Af Amer: 38 mL/min — ABNORMAL LOW (ref >60)
GFR, EST AFRICAN AMERICAN: 46 mL/min — AB (ref >60)
GFR, EST NON AFRICAN AMERICAN: 33 mL/min — AB (ref >60)
GFR, EST NON AFRICAN AMERICAN: 39 mL/min — AB (ref >60)
Glucose, Bld: 180 mg/dL — ABNORMAL HIGH (ref 65–99)
Glucose, Bld: 229 mg/dL — ABNORMAL HIGH (ref 65–99)
PHOSPHORUS: 3.1 mg/dL (ref 2.5–4.6)
PHOSPHORUS: 2.6 mg/dL (ref 2.5–4.6)
POTASSIUM: 5.1 mmol/L (ref 3.5–5.1)
Potassium: 4.7 mmol/L (ref 3.5–5.1)
SODIUM: 137 mmol/L (ref 135–145)
Sodium: 138 mmol/L (ref 135–145)

## 2016-09-21 LAB — ECHOCARDIOGRAM COMPLETE
HEIGHTINCHES: 65 in
Weight: 2433.88 oz

## 2016-09-21 LAB — GLUCOSE, CAPILLARY
GLUCOSE-CAPILLARY: 111 mg/dL — AB (ref 65–99)
GLUCOSE-CAPILLARY: 128 mg/dL — AB (ref 65–99)
GLUCOSE-CAPILLARY: 174 mg/dL — AB (ref 65–99)
GLUCOSE-CAPILLARY: 189 mg/dL — AB (ref 65–99)
GLUCOSE-CAPILLARY: 89 mg/dL (ref 65–99)
Glucose-Capillary: 158 mg/dL — ABNORMAL HIGH (ref 65–99)
Glucose-Capillary: 105 mg/dL — ABNORMAL HIGH (ref 65–99)

## 2016-09-21 LAB — CBC
HEMATOCRIT: 23 % — AB (ref 36.0–46.0)
HEMOGLOBIN: 7.1 g/dL — AB (ref 12.0–15.0)
MCH: 28 pg (ref 26.0–34.0)
MCHC: 30.9 g/dL (ref 30.0–36.0)
MCV: 90.6 fL (ref 78.0–100.0)
Platelets: 186 10*3/uL (ref 150–400)
RBC: 2.54 MIL/uL — ABNORMAL LOW (ref 3.87–5.11)
RDW: 17.1 % — ABNORMAL HIGH (ref 11.5–15.5)
WBC: 12.9 10*3/uL — ABNORMAL HIGH (ref 4.0–10.5)

## 2016-09-21 LAB — MAGNESIUM: MAGNESIUM: 2.9 mg/dL — AB (ref 1.7–2.4)

## 2016-09-21 MED ORDER — VANCOMYCIN HCL 500 MG IV SOLR
500.0000 mg | Freq: Once | INTRAVENOUS | Status: AC
  Administered 2016-09-21: 500 mg via INTRAVENOUS
  Filled 2016-09-21: qty 500

## 2016-09-21 MED ORDER — VANCOMYCIN HCL IN DEXTROSE 750-5 MG/150ML-% IV SOLN
750.0000 mg | Freq: Once | INTRAVENOUS | Status: AC
  Administered 2016-09-22: 750 mg via INTRAVENOUS
  Filled 2016-09-21: qty 150

## 2016-09-21 MED ORDER — METHYLPREDNISOLONE SODIUM SUCC 40 MG IJ SOLR
40.0000 mg | Freq: Every day | INTRAMUSCULAR | Status: DC
  Administered 2016-09-21: 40 mg via INTRAVENOUS
  Filled 2016-09-21 (×2): qty 1

## 2016-09-21 MED ORDER — PRISMASOL BGK 4/2.5 32-4-2.5 MEQ/L IV SOLN
INTRAVENOUS | Status: DC
  Administered 2016-09-22 – 2016-09-23 (×2): via INTRAVENOUS_CENTRAL
  Filled 2016-09-21 (×3): qty 5000

## 2016-09-21 MED ORDER — PRISMASOL BGK 4/2.5 32-4-2.5 MEQ/L IV SOLN
INTRAVENOUS | Status: DC
  Administered 2016-09-21 – 2016-09-24 (×25): via INTRAVENOUS_CENTRAL
  Filled 2016-09-21 (×35): qty 5000

## 2016-09-21 MED ORDER — PRISMASOL BGK 4/2.5 32-4-2.5 MEQ/L IV SOLN
INTRAVENOUS | Status: DC
  Administered 2016-09-21 – 2016-09-23 (×3): via INTRAVENOUS_CENTRAL
  Filled 2016-09-21 (×8): qty 5000

## 2016-09-21 MED ORDER — POLYETHYLENE GLYCOL 3350 17 G PO PACK
17.0000 g | PACK | ORAL | Status: DC
  Administered 2016-09-21 – 2016-09-22 (×2): 17 g via ORAL
  Filled 2016-09-21 (×4): qty 1

## 2016-09-21 MED ORDER — HEPARIN SODIUM (PORCINE) 1000 UNIT/ML DIALYSIS: 1000.0000 [IU] | INTRAMUSCULAR | Status: DC | PRN

## 2016-09-21 MED ORDER — VANCOMYCIN HCL IN DEXTROSE 750-5 MG/150ML-% IV SOLN: 750.0000 mg | INTRAVENOUS | Status: DC

## 2016-09-21 NOTE — Progress Notes (Addendum)
[name] Progress Note   Subjective: I/O net neg 2.3L yest,,  CXR improved LLL, no edema Vitals:   09/21/16 0700 09/21/16 0702 09/21/16 0744 09/21/16 0822  BP: (!) 77/47 99/72 (!) 83/35   Pulse: 72 78 74   Resp: (!) 21 (!) 24 20   Temp:    97.3 F (36.3 C)  TempSrc:    Oral  SpO2: 100% 100% 100%   Weight:      Height:        Inpatient medications: . sodium chloride   Intravenous Once  . calcitRIOL  1.5 mcg Oral Q M,W,F-HD  . chlorhexidine gluconate (MEDLINE KIT)  15 mL Mouth Rinse BID  . cinacalcet  30 mg Oral Q breakfast  . famotidine (PEPCID) IV  20 mg Intravenous Q24H  . fentaNYL (SUBLIMAZE) injection  50 mcg Intravenous Once  . insulin aspart  2-6 Units Subcutaneous Q4H  . mouth rinse  15 mL Mouth Rinse QID  . methylPREDNISolone (SOLU-MEDROL) injection  40 mg Intravenous Q12H  . multivitamin  1 tablet Oral QHS  . nortriptyline  10 mg Per Tube QHS  . pravastatin  20 mg Oral Daily  . pregabalin  75 mg Oral QHS  . sevelamer carbonate  4,800 mg Oral TID WC  . sodium chloride flush  3 mL Intravenous Q12H  . thiamine  100 mg Oral Daily   . feeding supplement (VITAL AF 1.2 CAL) 1,000 mL (09/21/16 0500)  . fentaNYL infusion INTRAVENOUS 100 mcg/hr (09/21/16 0700)  . norepinephrine (LEVOPHED) Adult infusion    . dialysis replacement fluid (prismasate) 200 mL/hr at 09/21/16 0214  . dialysis replacement fluid (prismasate) 300 mL/hr at 09/21/16 0254  . dialysate (PRISMASATE) 2,000 mL/hr at 09/21/16 0532   acetaminophen **OR** acetaminophen, calcium carbonate (dosed in mg elemental calcium), diphenhydrAMINE, fentaNYL, heparin, midazolam, midazolam, sevelamer carbonate, sodium chloride, sorbitol  Exam: General: intubated, sedated Neck: no jvd, supple Heart: no MRG RRR Lungs: CTA  Abdomen: obese, soft, NT. ND Extremities: no pedal edema  Skin: Right orbital bruising , no overt rash , warm dry  Neuro: sedated on vent Dialysis Access: Pos bruit RFA  VAF  Dialysis: [facility] MWF   4h 20mn  70.5kg   2/2 bath  Hep 5400   RFA AVF   Calcitriol 1.5  Mcg po /HD  Mircera 150 mcg q 2wks (last on 09/27/2016)      Other op labs hgb 9.9  (09/11/16)   Ca 10,3 phos 7.4  pth 268   Assessment: 1. Sepsis/ MRSE bacteremia - on IV abx 2. VDRF - pulm edema better 3. Volume - 1kg under dry wt 4. ESRD - on CRRT, usual HD mwf 5. Anemia / ckd - severe, Hb 5.1, usual 9-10 range; got ESA 11/15;  2u prbc here11/16 6. Hypotension - persists, normal BP's as OP, not on bp med at home 7. Metabolic bone disease -  renvela binder, Po calcitriol/ Sensipar '30mg'$  qday when eating 8. Hx lupus - plaquenil  Plan - trouble weaning, cont CRRT    RKelly SplinterMD [facility] pager [phone]  09/21/2016, 9:00 AM    Recent Labs Lab 09/20/16 0500 09/20/16 1636 09/21/16 0405  NA 137 135 138  K 4.8 5.4* 5.1  CL 103 102 103  CO2 '27 25 28  '$ GLUCOSE 150* 189* 180*  BUN 18 19 21*  CREATININE 2.11* 1.78* 1.71*  CALCIUM 9.2 9.2 9.6  PHOS 2.7  2.7 2.4*  2.4* 3.1    Recent  Labs Lab 10/01/2016 1945  09/19/16 1027  09/20/16 0500 09/20/16 1636 09/21/16 0405  AST 20  --   --   --   --   --   --   ALT 9*  --   --   --   --   --   --   ALKPHOS 55  --   --   --   --   --   --   BILITOT 0.5  --  0.7  --   --   --   --   PROT 6.0*  --   --   --   --   --   --   ALBUMIN 2.4*  < >  --   < > 2.5* 2.5* 2.6*  < > = values in this interval not displayed.  Recent Labs Lab 09/07/2016 1945  09/19/16 0500 09/20/16 0500 09/21/16 0405  WBC 2.3*  < > 7.2 8.2 12.9*  NEUTROABS 1.5*  --   --   --   --   HGB 11.9*  < > 7.5* 7.2* 7.1*  HCT 37.6  < > 23.1* 22.4* 23.0*  MCV 85.5  < > 85.2 87.5 90.6  PLT 86*  < > 121* 156 186  < > = values in this interval not displayed. Iron/TIBC/Ferritin/ %Sat    Component Value Date/Time   IRON 68 09/18/2016 0813   TIBC 218 (L) 09/18/2016 0813   FERRITIN 480 (H) 09/18/2016 0813   IRONPCTSAT 31 09/18/2016 0813

## 2016-09-21 NOTE — Consult Note (Signed)
Admit date: 09/07/2016 Referring Physician  Dr. 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 Lona Kettle and not seen since then and at that time trop mildly elevated and felt due to diastolic CHF and has not followed up with cards since then) and CVA who presnted to the ER on 11/15 from HD with SOB and feeling that her neck and face were swollen.  She was admitted to the [facility] service and treated with IV antbx for possible PNA.  She was also anemic and received 2 units PRBCs and then became acutely SOB with subsequent respiratory arrest requiring emergent intubation. Cxray showed interstitial edema felt secondary to volume overload since she had had incomplete HD on 11/15 and also hydrated in ER due to hypotension/blood products and IV antbx with subsequent flash pulmonary edema. She was placed on pressors due to hypotension which are being weaned.  2D echo was done today which showed normal LVF with G1DD, moderate AR and  2x1cm large oscillating density on the anterior MV c/w probable veg with severe MR. Cardiology is now asked to consult. She has been afebrile except for temp of 100.8 initially on admission. Blood cx positive on 11/15 for coag neg staph and was started on IV Vanc but blood cx since then have been neg.    PMH:   Past Medical History:  Diagnosis Date  . Anemia   . Anxiety   . Arthritis    RA  . Blood transfusion without reported diagnosis   . Chronic ankle pain    due to RA?  Marland Kitchen Colitis    ? per hospital notes 11/2014  . Diabetes mellitus (HCC)    border line  . ESRD (end-stage renal disease) due to SLE ([place])    on HD  . Hemiparesis and alteration of sensations as late effects of stroke ([place]) 06/19/2016  . Hemorrhoids   . Hyperlipidemia   . Hyperplastic colon polyp   .  Hypertension   . Hyponatremia   . Lower GI bleed   . Lupus   . 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 Metabolic acidosis   . Metatarsal bone fracture right   4th  . Myocardial infarction   . Pain in joints   . Paresthesias   . Pneumonia   . Stenosis of cervical spine region    with HNP at C5/6, C6/7  . Stroke [facility]) 11/2014   left sided weakness, dysphagia     PSH:   Past Surgical History:  Procedure Laterality Date  . AV FISTULA PLACEMENT Right 12/21/2015   Procedure: ARTERIOVENOUS (AV) FISTULA CREATION RIGHT ARM;  Surgeon: [name], MD;  Location: [place];  Service: Vascular;  Laterality: Right;  . BASCILIC VEIN TRANSPOSITION Left 02/14/2015   Procedure: BASILIC VEIN Fistula Creation First Stage;  Surgeon: Rosetta Posner, MD;  Location: [place];  Service: Vascular;  Laterality: Left;  . BASCILIC VEIN TRANSPOSITION Left 03/28/2015   Procedure: LEFT ARM SECOND STAGE BASCILIC VEIN TRANSPOSITION;  Surgeon: Rosetta Posner, MD;  Location: [place];  Service: Vascular;  Laterality: Left;  . BASCILIC VEIN TRANSPOSITION Right 04/17/2016   Procedure: RIGHT SECOND STAGE BASILIC VEIN TRANSPOSITION;  Surgeon: [name], MD;  Location: [place];  Service: Vascular;  Laterality: Right;  . BREAST BIOPSY    .  COLONOSCOPY W/ BIOPSIES AND POLYPECTOMY    . INSERTION OF DIALYSIS CATHETER Right 02/05/2015   Procedure: INSERTION OF DIALYSIS CATHETER - RIGHT INTERNAL JUGULAR;  Surgeon: Conrad Connerville, MD;  Location: [place];  Service: Vascular;  Laterality: Right;  . INSERTION OF DIALYSIS CATHETER N/A 10/05/2015   Procedure: INSERTION OF DIALYSIS CATHETER Right Internal Jugular.;  Surgeon: Mal Misty, MD;  Location: [place];  Service: Vascular;  Laterality: N/A;  . INSERTION OF DIALYSIS CATHETER Left 12/21/2015   Procedure: INSERTION OF DIALYSIS CATHETER LEFT INTERNAL JUGULAR VEIN;  Surgeon: [name], MD;  Location: [place];  Service: Vascular;  Laterality: Left;  . IR GENERIC HISTORICAL Right  06/12/2016   IR DIALY SHUNT INTRO NEEDLE/INTRACATH INITIAL W/IMG RIGHT 06/12/2016 Markus Daft, MD [facility]-INTERV RAD  . LIGATION OF ARTERIOVENOUS  FISTULA Left 12/21/2015   Procedure: LIGATION OF ARTERIOVENOUS  FISTULA LEFT ARM;  Surgeon: [name], MD;  Location: [place];  Service: Vascular;  Laterality: Left;  . PERIPHERAL VASCULAR CATHETERIZATION N/A 10/10/2015   Procedure: Fistulagram;  Surgeon: [name], MD;  Location: [place] CV LAB;  Service: Cardiovascular;  Laterality: N/A;  . PERIPHERAL VASCULAR CATHETERIZATION N/A 03/18/2016   Procedure: Aortic Arch Angiography;  Surgeon: [name], MD;  Location: [place] CV LAB;  Service: Cardiovascular;  Laterality: N/A;  . PERIPHERAL VASCULAR CATHETERIZATION Left 03/18/2016   Procedure: Peripheral Vascular Balloon Angioplasty;  Surgeon: [name], MD;  Location: [place] CV LAB;  Service: Cardiovascular;  Laterality: Left;  subclavian  . REVISON OF ARTERIOVENOUS FISTULA Left 09/26/2015   Procedure: Revision Left Basilic Vein Transposition with Banding using Gortex graft;  Surgeon: Mal Misty, MD;  Location: [place];  Service: Vascular;  Laterality: Left;  . TUBAL LIGATION      Allergies:  Sulfa antibiotics Prior to Admit Meds:   Prescriptions Prior to Admission  Medication Sig Dispense Refill Last Dose  . acetaminophen (TYLENOL) 325 MG tablet Take 650 mg by mouth every 6 (six) hours as needed for moderate pain.    Past Month at Unknown time  . ALPRAZolam (XANAX) 0.25 MG tablet Take 1 tablet (0.25 mg total) by mouth every 8 (eight) hours as needed for anxiety (0.25mg -0.5mg ). (Patient taking differently: Take 0.25-0.5 mg by mouth every 8 (eight) hours as needed for anxiety. ) 60 tablet 0 Past Month at Unknown time  . Aspirin-Salicylamide-Caffeine (BC FAST PAIN RELIEF) 650-195-33.3 MG PACK Take 1 Package by mouth daily as needed (pain).   09/16/2016 at Unknown time  . cinacalcet (SENSIPAR) 30 MG tablet Take 30 mg by mouth daily.    09/16/2016 at Unknown time  . LYRICA 75 MG capsule take 1 capsule by mouth twice a day 60 capsule 5 09/10/2016 at 0600  . multivitamin (RENA-VIT) TABS tablet Take 1 tablet by mouth daily.   09/16/2016 at Unknown time  . nortriptyline (PAMELOR) 10 MG capsule Take one capsule at night for one week, then take 2 capsules at night for one week, then take 3 capsules at night (Patient taking differently: Take 10 mg by mouth at bedtime. Take one capsule at night for one week, then take 2 capsules at night for one week, then take 3 capsules at night) 90 capsule 3 09/16/2016 at Unknown time  . oxyCODONE-acetaminophen (PERCOCET/ROXICET) 5-325 MG tablet Take 1 tablet by mouth every 6 (six) hours as needed. 15 tablet 0 prn  . pantoprazole (PROTONIX) 40 MG tablet Take 1 tablet (40 mg total) by mouth daily.  30 tablet 11 09/16/2016 at Unknown time  . pravastatin (PRAVACHOL) 20 MG tablet Take 20 mg by mouth daily.   09/16/2016 at Unknown time  . RENVELA 800 MG tablet Take 2,400-4,800 mg by mouth See admin instructions. Takes six tablets three times daily with each meal and three tablets with snacks.  1 09/16/2016 at Unknown time  . temazepam (RESTORIL) 30 MG capsule Take 30 mg by mouth at bedtime as needed for sleep.    Past Month at Unknown time  . thiamine 100 MG tablet Take 1 tablet (100 mg total) by mouth daily. 30 tablet 1 09/16/2016 at Unknown time   Fam HX:    Family History  Problem Relation Age of Onset  . Hypertension Mother   . Diabetes Mother   . Stroke Mother   . Hypertension Sister   . Diabetes Father   . Hypertension Maternal Grandmother   . Hypertension    . Lupus    . Rheum arthritis    . Colon cancer Neg Hx    Social HX:    Social History   Social History  . Marital status: Single    Spouse name: N/A  . Number of children: 3  . Years of education: 2 yrs college   Occupational History  . disabled    Social History Main Topics  . Smoking status: Former Smoker    Packs/day:  0.50    Years: 30.00    Types: Cigarettes    Quit date: 09/22/2014  . Smokeless tobacco: Never Used  . Alcohol use No  . Drug use: No  . Sexual activity: Not on file   Other Topics Concern  . Not on file   Social History Narrative   Lives at home w/ her 2 sons   Right-handed   Caffeine: rare     ROS:  All 11 ROS were addressed and are negative except what is stated in the HPI  Physical Exam: Blood pressure (!) 99/37, pulse 76, temperature 97.8 F (36.6 C), temperature source Oral, resp. rate 20, height 5\' 5"  (1.651 m), weight 152 lb 1.9 oz (69 kg), last menstrual period 07/18/2012, SpO2 100 %.    General: Intubated and sedated Head: Eyes PERRLA, No xanthomas.   Normal cephalic and atramatic  Lungs:   Clear bilaterally to auscultation and percussion. Heart:   HRRR S1 S2 Pulses are 2+ & equal.            No carotid bruit. No JVD.  No abdominal bruits. No femoral bruits. Abdomen: Bowel sounds are positive, abdomen soft and non-tender without masses  Msk:  Back normal, normal gait. Normal strength and tone for age. Extremities:   No clubbing, cyanosis or edema.  DP +1 Neuro: Alert and oriented X 3. Psych:  Good affect, responds appropriately    Labs:   Lab Results  Component Value Date   WBC 12.9 (H) 09/21/2016   HGB 7.1 (L) 09/21/2016   HCT 23.0 (L) 09/21/2016   MCV 90.6 09/21/2016   PLT 186 09/21/2016    Recent Labs Lab 09/27/2016 1945  09/19/16 1027  09/21/16 0405  NA 136  < >  --   < > 138  K 3.7  < >  --   < > 5.1  CL 96*  < >  --   < > 103  CO2 27  < >  --   < > 28  BUN 28*  < >  --   < > 21*  CREATININE 4.38*  < >  --   < > 1.71*  CALCIUM 8.6*  < >  --   < > 9.6  PROT 6.0*  --   --   --   --   BILITOT 0.5  --  0.7  --   --   ALKPHOS 55  --   --   --   --   ALT 9*  --   --   --   --   AST 20  --   --   --   --   GLUCOSE 110*  < >  --   < > 180*  < > = values in this interval not displayed. No results found for: PTT Lab Results  Component Value Date     INR 1.01 09/18/2016   INR 1.29 02/02/2015   INR 1.18 11/19/2014   Lab Results  Component Value Date   CKTOTAL 36 11/19/2014   CKMB (HH) 01/05/2011    7.8 CRITICAL RESULT CALLED TO, READ BACK BY AND VERIFIED WITH: BYERLY,D RN 01/05/11 0620 ACAINJ   TROPONINI 0.20 (HH) 09/19/2016     Lab Results  Component Value Date   CHOL 154 06/29/2015   CHOL 78 10/12/2014   CHOL 163 09/30/2014   Lab Results  Component Value Date   HDL 39 (L) 06/29/2015   HDL 12 (L) 10/12/2014   HDL 31 (L) 09/30/2014   Lab Results  Component Value Date   LDLCALC 81 06/29/2015   LDLCALC 48 10/12/2014   LDLCALC 109 (H) 09/30/2014   Lab Results  Component Value Date   TRIG 169 (H) 06/29/2015   TRIG 234 (H) 01/27/2015   TRIG 258 (H) 01/24/2015   Lab Results  Component Value Date   CHOLHDL 3.9 06/29/2015   CHOLHDL 6.5 10/12/2014   CHOLHDL 5.3 09/30/2014   No results found for: LDLDIRECT    Radiology:  Dg Chest Port 1 View  Result Date: 09/21/2016 CLINICAL DATA:  Respiratory failure. Collagen vascular disease. Diabetes. Hypertension. EXAM: PORTABLE CHEST 1 VIEW COMPARISON:  One day prior FINDINGS: Endotracheal tube terminates 3.5 cm above carina.Nasogastric extends beyond the inferior aspect of the film. Right-sided internal jugular line tip at low SVC. Midline trachea. Cardiomegaly accentuated by AP portable technique. Atherosclerosis in the transverse aorta. Probable trace bilateral pleural effusions. No pneumothorax. Further improvement in aeration, with resolved interstitial edema. Decreased bibasilar atelectasis. IMPRESSION: Further improvement in aeration with resolved interstitial edema and decreased bibasilar atelectasis. Probable trace bilateral pleural effusions. Electronically Signed   By: Abigail Miyamoto M.D.   On: 09/21/2016 07:38   Dg Chest Port 1 View  Result Date: 09/20/2016 CLINICAL DATA:  Acute respiratory failure EXAM: PORTABLE CHEST 1 VIEW COMPARISON:  09/19/2016 FINDINGS: Support  devices remain in place, unchanged. Heart is mildly enlarged. Improving perihilar and lower lobe opacities, likely improving edema. Bibasilar atelectasis with small effusions remain. IMPRESSION: Improving pulmonary edema pattern. Continued bibasilar atelectasis with small effusions. Electronically Signed   By: Rolm Baptise M.D.   On: 09/20/2016 07:27    EKG:  Sinus tachycardia with inferior infarct and no ST changes  ASSESSMENT/PLAN:   1.  Acute on chronic diastolic HF with acute pulmonary edema secondary to incomplete HD with subsequent IVF resuscitation for hypotension, IV antbx and blood products.  I suspect that her severe MR on echo is also playing a role in acute decompensation as well.  EF normal on echo but does have evidence of diastolic dysfunction.   2.  Mitral valve vegetation with severe MR by TTE today. Echo in 2012 showed normal MV leaflets with no prolapse but moderate MR was present at that time.   She was febrile to 100.8 on admission but has been afebrile since then. Initial blood cx positive for coag neg staph and started on IV Vanc and subsequent blood cx neg to date.  Recommend ID consult.  Will plan for TEE in am to assess MV further tomorrow.    3.  Minimally elevated troponin with flat trend c/w demand ischemia in the setting of acute pulmonary edema, sepsis and ESRD.  ? Remote MI on her records - she had a troponin elevation in 2012 during acute CHF exacerbation.  She was supposed to have stress test but never got this done but had a dobutamine echo 07/2015 which showed no ischemia and normal LVF. She has never had a cath that documented MI or CAD.   Fransico Him, MD  09/21/2016  2:01 PM

## 2016-09-21 NOTE — Progress Notes (Signed)
RT placed pt back on full vent support due to increased RR and WOB.

## 2016-09-21 NOTE — Progress Notes (Signed)
PULMONARY / CRITICAL CARE MEDICINE   Name: [name] [name] MRN: [id number] DOB: 1961/02/02    ADMISSION DATE:  09/27/2016 CONSULTATION DATE:  11/16  REFERRING MD:  Dr. 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 She presented to [facility] ED 11/15 from HD with complaints of SOB and subjective feeling of face/neck swelling. She was evaluated in the ED with concern for PNA and was admitted to the hospitalists and treated with IV antibiotics.   She had some acute on chronic anemia 11/16 prompting the administration of 2 units PRBC on 11/16, after which, she developed worsening SOB ultimately resulting in respiratory arrest requiring emergent intubation. PCCM to assume care.  SIGNIFICANT EVENTS: 11/15 admit 11/16 intubated for respiratory distress   SUBJECTIVE:  Echo being done cvvhd remained  VITAL SIGNS: BP (!) 99/37   Pulse 77   Temp 97.3 F (36.3 C) (Oral)   Resp (!) 32   Ht _0  (1.651 m)   Wt 69 kg (152 lb 1.9 oz)   LMP 07/18/2012   SpO2 100%   BMI 25.31 kg/m   HEMODYNAMICS:    VENTILATOR SETTINGS: Vent Mode: PRVC FiO2 (%):  [20 %-30 %] 30 % Set Rate:  [20 bmp] 20 bmp Vt Set:  [460 mL] 460 mL PEEP:  [5 cmH20] 5 cmH20 Pressure Support:  [5 cmH20-16 cmH20] 5 cmH20 Plateau Pressure:  [17 cmH20-25 cmH20] 17 cmH20  INTAKE / OUTPUT: I/O last 3 completed shifts: In: 2793.5 [I.V.:688.5; NG/GT:1905; IV Piggyback:200] Out: 6010 [Other:6010]  PHYSICAL EXAMINATION: General:  Obese female lying in bed in NAD  Neuro:  Follows commands moves all ext HEENT:  ETT in place, PERRL Cardiovascular:  RRR s1 s2  Lungs:  Coarse Abdomen:  Soft, non-distended, no r/g Musculoskeletal:  No acute deformity. RUE AV fistula   LABS:  BMET  Recent Labs Lab 09/20/16 0500 09/20/16 1636 09/21/16 0405  NA 137 135 138  K 4.8 5.4* 5.1  CL 103 102 103  CO2 _1 BUN 18 19  21*  CREATININE 2.11* 1.78* 1.71*  GLUCOSE 150* 189* 180*    Electrolytes  Recent Labs Lab 09/20/16 0500 09/20/16 1636 09/21/16 0405  CALCIUM 9.2 9.2 9.6  MG 2.5* 2.6* 2.9*  PHOS 2.7  2.7 2.4*  2.4* 3.1    CBC  Recent Labs Lab 09/19/16 0500 09/20/16 0500 09/21/16 0405  WBC 7.2 8.2 12.9*  HGB 7.5* 7.2* 7.1*  HCT 23.1* 22.4* 23.0*  PLT 121* 156 186    Coag's  Recent Labs Lab 09/18/16 0336  APTT 29  INR 1.01    Sepsis Markers  Recent Labs Lab 09/18/16 0008 09/18/16 0336 09/18/16 0515  LATICACIDVEN 0.55 0.9 0.6  PROCALCITON  --  2.62  --     ABG  Recent Labs Lab 09/18/16 1843  PHART 7.331*  PCO2ART 54.2*  PO2ART 75.4*    Liver Enzymes  Recent Labs Lab 09/30/2016 1945  09/19/16 1027  09/20/16 0500 09/20/16 1636 09/21/16 0405  AST 20  --   --   --   --   --   --   ALT 9*  --   --   --   --   --   --   ALKPHOS 55  --   --   --   --   --   --   BILITOT 0.5  --  0.7  --   --   --   --   ALBUMIN 2.4*  < >  --   < > 2.5* 2.5* 2.6*  < > = values in this interval not displayed.  Cardiac Enzymes  Recent Labs Lab 09/19/16 1027 09/19/16 1627 09/19/16 2137  TROPONINI 0.32* 0.25* 0.20*    Glucose  Recent Labs Lab 09/20/16 1236 09/20/16 1622 09/20/16 2006 09/21/16 0006 09/21/16 0412 09/21/16 0824  GLUCAP 110* 159* 83 89 174* 111*    Imaging Dg Chest Port 1 View  Result Date: 09/21/2016 CLINICAL DATA:  Respiratory failure. Collagen vascular disease. Diabetes. Hypertension. EXAM: PORTABLE CHEST 1 VIEW COMPARISON:  One day prior FINDINGS: Endotracheal tube terminates 3.5 cm above carina.Nasogastric extends beyond the inferior aspect of the film. Right-sided internal jugular line tip at low SVC. Midline trachea. Cardiomegaly accentuated by AP portable technique. Atherosclerosis in the transverse aorta. Probable trace bilateral pleural effusions. No pneumothorax. Further improvement in aeration, with resolved interstitial edema. Decreased  bibasilar atelectasis. IMPRESSION: Further improvement in aeration with resolved interstitial edema and decreased bibasilar atelectasis. Probable trace bilateral pleural effusions. Electronically Signed   By: Abigail Miyamoto M.D.   On: 09/21/2016 07:38     STUDIES:  CT head 11/15 neg for acute  Echo 11/19>>>  CULTURES: Blood 11/15 >>>GPC in clusters, >Staph species (co ag neg )   ANTIBIOTICS: Vanc 11/15 >>> Zosyn 11/15 > 11/16  LINES/TUBES: ETT 11/16 >>> 11/16 rt IJ>>>  DISCUSSION: 55 year old female with ESRD on HD admitted for PNA 11/15, however, CXR at that time, given current changes, more likely represented volume overload. She likely had incomplete HD 11/15 and got volume in the way of IVF, ABX, and blood products since being admitted. I suspect she suffered flash pulmonary edema , cxr is improved after CRRT   ASSESSMENT / PLAN:  PULMONARY A: Acute hypoxemic respiratory failure secondary to pulmonary edema TRALI- concern, lower with unofficial echo result HCAP r/o  P:   Full vent support Assess for daily wean/SBT , PS needed still, wonder if valvular dysfunction cause of acute edema, NOT blood CXR VAP bundle HD for volume removal, successful Empiric steroids, wean to off  Maintain neg balance  CARDIOVASCULAR A:  H/o chronic hypotension, MI R/o endocarditis  P:  Telemetry monitoring Troponin, repeat Wean pressors to keep MAP >6  Echo, pre read concerns veg , also assume MR  RENAL A:   ESRD on HD  P:   HD per nephrology Neg balance goals met 2.3 liters, can we maintain further cvvhd  GASTROINTESTINAL A:   ? GIB P:   NPO for now Occult blood testing p  pepcid IV  feeding  HEMATOLOGIC A:   Anemia (baseline Hgb 9-10) Lupus, r/o hemolysis, hapt neg, ldh neg P:  Repeat Hgb in am  Transfuse for Hgb < 7 Assess hemolysis - neg thus far  INFECTIOUS A:   MRSE Bacteremia NO hardware in place chronic R/o endocarditis P:   Vancomycin keep Repeat  Blood Cultures - neg thus far Echo read needed After echo, will need ID consult  ENDOCRINE A:   DM  P:   CBG monitoring and SSI  NEUROLOGIC A:   Acute encephalopathy resolved P:   RASS goal: -1 to 0  Fentanyl gtt, WUA   FAMILY  - Updates: I updated son at bedside  - Inter-disciplinary family meet or Palliative Care meeting due by:  11/24  Ccm time 30 min   Lavon Paganini. Titus Mould, MD, Rosalita Chessman  Pgr: [phone] [place] [facility] 09/21/2016 11:07 AM

## 2016-09-21 NOTE — Progress Notes (Signed)
Echocardiogram 2D Echocardiogram has been performed.  [name] [name] 09/21/2016, 10:42 AM

## 2016-09-22 ENCOUNTER — Inpatient Hospital Stay (HOSPITAL_COMMUNITY): Payer: Managed Care, Other (non HMO)

## 2016-09-22 DIAGNOSIS — Z87891 Personal history of nicotine dependence: Secondary | ICD-10-CM

## 2016-09-22 DIAGNOSIS — B9562 Methicillin resistant Staphylococcus aureus infection as the cause of diseases classified elsewhere: Secondary | ICD-10-CM

## 2016-09-22 DIAGNOSIS — N186 End stage renal disease: Secondary | ICD-10-CM

## 2016-09-22 DIAGNOSIS — Z8261 Family history of arthritis: Secondary | ICD-10-CM

## 2016-09-22 DIAGNOSIS — Z8249 Family history of ischemic heart disease and other diseases of the circulatory system: Secondary | ICD-10-CM

## 2016-09-22 DIAGNOSIS — Z9911 Dependence on respirator [ventilator] status: Secondary | ICD-10-CM

## 2016-09-22 DIAGNOSIS — J9602 Acute respiratory failure with hypercapnia: Secondary | ICD-10-CM

## 2016-09-22 DIAGNOSIS — Z992 Dependence on renal dialysis: Secondary | ICD-10-CM

## 2016-09-22 DIAGNOSIS — Z833 Family history of diabetes mellitus: Secondary | ICD-10-CM

## 2016-09-22 DIAGNOSIS — I351 Nonrheumatic aortic (valve) insufficiency: Secondary | ICD-10-CM

## 2016-09-22 DIAGNOSIS — Z882 Allergy status to sulfonamides status: Secondary | ICD-10-CM

## 2016-09-22 DIAGNOSIS — Z800 Family history of malignant neoplasm of digestive organs: Secondary | ICD-10-CM

## 2016-09-22 DIAGNOSIS — Z823 Family history of stroke: Secondary | ICD-10-CM

## 2016-09-22 DIAGNOSIS — M3214 Glomerular disease in systemic lupus erythematosus: Secondary | ICD-10-CM

## 2016-09-22 LAB — GLUCOSE, CAPILLARY
GLUCOSE-CAPILLARY: 189 mg/dL — AB (ref 65–99)
Glucose-Capillary: 135 mg/dL — ABNORMAL HIGH (ref 65–99)
Glucose-Capillary: 148 mg/dL — ABNORMAL HIGH (ref 65–99)
Glucose-Capillary: 166 mg/dL — ABNORMAL HIGH (ref 65–99)
Glucose-Capillary: 173 mg/dL — ABNORMAL HIGH (ref 65–99)
Glucose-Capillary: 223 mg/dL — ABNORMAL HIGH (ref 65–99)

## 2016-09-22 LAB — TYPE AND SCREEN
ABO/RH(D): O POS
Antibody Screen: NEGATIVE
UNIT DIVISION: 0
Unit division: 0
Unit division: 0
Unit division: 0

## 2016-09-22 LAB — RENAL FUNCTION PANEL
ALBUMIN: 3.2 g/dL — AB (ref 3.5–5.0)
ALBUMIN: 3.1 g/dL — AB (ref 3.5–5.0)
ANION GAP: 12 (ref 5–15)
Anion gap: 11 (ref 5–15)
BUN: 19 mg/dL (ref 6–20)
BUN: 17 mg/dL (ref 6–20)
CALCIUM: 9.8 mg/dL (ref 8.9–10.3)
CALCIUM: 9.6 mg/dL (ref 8.9–10.3)
CO2: 26 mmol/L (ref 22–32)
CO2: 25 mmol/L (ref 22–32)
Chloride: 99 mmol/L — ABNORMAL LOW (ref 101–111)
Chloride: 104 mmol/L (ref 101–111)
Creatinine, Ser: 1.38 mg/dL — ABNORMAL HIGH (ref 0.44–1.00)
Creatinine, Ser: 1.22 mg/dL — ABNORMAL HIGH (ref 0.44–1.00)
GFR calc Af Amer: 57 mL/min — ABNORMAL LOW (ref >60)
GFR calc non Af Amer: 42 mL/min — ABNORMAL LOW (ref >60)
GFR, EST AFRICAN AMERICAN: 49 mL/min — AB (ref >60)
GFR, EST NON AFRICAN AMERICAN: 49 mL/min — AB (ref >60)
Glucose, Bld: 204 mg/dL — ABNORMAL HIGH (ref 65–99)
Glucose, Bld: 195 mg/dL — ABNORMAL HIGH (ref 65–99)
PHOSPHORUS: 2.8 mg/dL (ref 2.5–4.6)
PHOSPHORUS: 2.9 mg/dL (ref 2.5–4.6)
Potassium: 4.4 mmol/L (ref 3.5–5.1)
Potassium: 4.8 mmol/L (ref 3.5–5.1)
SODIUM: 137 mmol/L (ref 135–145)
SODIUM: 140 mmol/L (ref 135–145)

## 2016-09-22 LAB — CBC
HCT: 26.9 % — ABNORMAL LOW (ref 36.0–46.0)
Hemoglobin: 8.2 g/dL — ABNORMAL LOW (ref 12.0–15.0)
MCH: 28.8 pg (ref 26.0–34.0)
MCHC: 30.5 g/dL (ref 30.0–36.0)
MCV: 94.4 fL (ref 78.0–100.0)
PLATELETS: 261 10*3/uL (ref 150–400)
RBC: 2.85 MIL/uL — ABNORMAL LOW (ref 3.87–5.11)
RDW: 19.3 % — AB (ref 11.5–15.5)
WBC: 24.8 10*3/uL — ABNORMAL HIGH (ref 4.0–10.5)

## 2016-09-22 LAB — MAGNESIUM: Magnesium: 2.8 mg/dL — ABNORMAL HIGH (ref 1.7–2.4)

## 2016-09-22 MED ORDER — PREDNISONE 20 MG PO TABS
40.0000 mg | ORAL_TABLET | Freq: Every day | ORAL | Status: DC
  Administered 2016-09-22: 40 mg
  Filled 2016-09-22 (×2): qty 2

## 2016-09-22 MED ORDER — DEXTROSE 5 % IV SOLN
2.0000 ug/min | INTRAVENOUS | Status: DC
  Administered 2016-09-22: 12 ug/min via INTRAVENOUS
  Administered 2016-09-23: 55 ug/min via INTRAVENOUS
  Administered 2016-09-23 (×2): 75 ug/min via INTRAVENOUS
  Administered 2016-09-23: 30 ug/min via INTRAVENOUS
  Administered 2016-09-24: 50 ug/min via INTRAVENOUS
  Administered 2016-09-24: 60 ug/min via INTRAVENOUS
  Filled 2016-09-22 (×8): qty 16

## 2016-09-22 MED ORDER — SODIUM CHLORIDE 0.9 % IV BOLUS (SEPSIS)
500.0000 mL | Freq: Once | INTRAVENOUS | Status: AC
  Administered 2016-09-22: 500 mL via INTRAVENOUS

## 2016-09-22 MED ORDER — VANCOMYCIN HCL IN DEXTROSE 750-5 MG/150ML-% IV SOLN
750.0000 mg | INTRAVENOUS | Status: DC
  Administered 2016-09-23: 750 mg via INTRAVENOUS
  Filled 2016-09-22 (×2): qty 150

## 2016-09-22 MED ORDER — POLYVINYL ALCOHOL 1.4 % OP SOLN
1.0000 [drp] | OPHTHALMIC | Status: DC | PRN
  Filled 2016-09-22: qty 15

## 2016-09-22 NOTE — Progress Notes (Signed)
[facility] Progress Note Patient Name: [name] [name] DOB: 01/19/1961 MRN: [id number]   Date of Service  09/22/2016  HPI/Events of Note  Hyptension - BP = 77/53 with MAP = 60. CVP = 4. Now on Norepinephrine IV infusion being titrated up.  eICU Interventions  Will order: 1. 0.9 NaCl 500 mL IV over 30 minutes now.  May repeat X 1 for CVP < 10.     Intervention Category Major Interventions: Hypotension - evaluation and management  Sommer,Steven Eugene 09/22/2016, 5:02 PM

## 2016-09-22 NOTE — Progress Notes (Signed)
CVVHD management.  Off pressors but according to nurse, BP erratic.  CVP 4-5, will keep even today.  Metabolically stable.  Vegetation seen on echo and to get TEE.  Not sure she will tolerate IHD from hemodynamic standpoint so will cont CVVHD.

## 2016-09-22 NOTE — Procedures (Signed)
Arterial Catheter Insertion Procedure Note [name] [name] [id number] January 07, 1961 2= Procedure: Insertion of Arterial Catheter  Indications: Blood pressure monitoring  Procedure Details Consent: Risks of procedure as well as the alternatives and risks of each were explained to the (patient/caregiver).  Consent for procedure obtained. Time Out: Verified patient identification, verified procedure, site/side was marked, verified correct patient position, special equipment/implants available, medications/allergies/relevent history reviewed, required imaging and test results available.  Performed  Maximum sterile technique was used including antiseptics, cap, gloves, gown, hand hygiene, mask and sheet. Skin prep: Chlorhexidine; local anesthetic administered 24 gauge catheter was inserted into left radial artery using the Seldinger technique.  Evaluation Blood flow good; BP tracing good. Complications: No apparent complications.   [name] [name] 09/22/2016

## 2016-09-22 NOTE — Progress Notes (Signed)
 Patient Name: [name] [name] Date of Encounter: 09/22/2016  Primary Cardiologist: [facility], Dr. Turner  Hospital Problem List     Principal Problem:   Tachycardia Active Problems:   Diabetes mellitus (HCC)   Fever   Elevated troponin level   Hypotension   End stage renal disease on dialysis (HCC)   Prolonged Q-T interval on ECG   Pancytopenia (HCC)   Acute on chronic respiratory failure with hypoxia (HCC)   Acute pulmonary edema (HCC)   Acute on chronic diastolic ACC/AHA stage C congestive heart failure (HCC)   Acute endocarditis   Mitral regurgitation     Subjective   Intubated, sedated.   Inpatient Medications    Scheduled Meds: . sodium chloride   Intravenous Once  . calcitRIOL  1.5 mcg Oral Q M,W,F-HD  . chlorhexidine gluconate (MEDLINE KIT)  15 mL Mouth Rinse BID  . cinacalcet  30 mg Oral Q breakfast  . famotidine (PEPCID) IV  20 mg Intravenous Q24H  . fentaNYL (SUBLIMAZE) injection  50 mcg Intravenous Once  . insulin aspart  2-6 Units Subcutaneous Q4H  . mouth rinse  15 mL Mouth Rinse QID  . multivitamin  1 tablet Oral QHS  . nortriptyline  10 mg Per Tube QHS  . polyethylene glycol  17 g Oral Q24H  . pravastatin  20 mg Oral Daily  . predniSONE  40 mg Per Tube Q breakfast  . pregabalin  75 mg Oral QHS  . sevelamer carbonate  4,800 mg Oral TID WC  . sodium chloride flush  3 mL Intravenous Q12H  . thiamine  100 mg Oral Daily   Continuous Infusions: . feeding supplement (VITAL AF 1.2 CAL) Stopped (09/22/16 0000)  . fentaNYL infusion INTRAVENOUS 50 mcg/hr (09/22/16 1000)  . norepinephrine (LEVOPHED) Adult infusion    . dialysis replacement fluid (prismasate) 300 mL/hr at 09/21/16 2027  . dialysis replacement fluid (prismasate) 200 mL/hr at 09/22/16 0349  . dialysate (PRISMASATE) 2,000 mL/hr at 09/22/16 0950   PRN Meds: calcium carbonate (dosed in mg elemental calcium), fentaNYL, heparin, midazolam, sevelamer carbonate, sorbitol   Vital Signs    Vitals:     09/22/16 0830 09/22/16 0900 09/22/16 0930 09/22/16 1000  BP: (!) 97/21 104/76 (!) 108/59 (!) 101/52  Pulse: (!) 115 (!) 126 (!) 120 (!) 125  Resp: 20 (!) 27 20 20  Temp:      TempSrc:      SpO2: 100% 100% 100% 100%  Weight:      Height:        Intake/Output Summary (Last 24 hours) at 09/22/16 1039 Last data filed at 09/22/16 1005  Gross per 24 hour  Intake          1590.07 ml  Output             3034 ml  Net         -1443.93 ml   Filed Weights   09/20/16 0500 09/21/16 0430 09/22/16 0400  Weight: 159 lb 2.8 oz (72.2 kg) 152 lb 1.9 oz (69 kg) 146 lb 6.2 oz (66.4 kg)    Physical Exam   GEN: Well nourished, well developed, in no acute distress.  HEENT: Grossly normal. ETT present Neck: Supple, no JVD, carotid bruits, or masses. Cardiac: RRR, no murmurs, rubs, or gallops. No clubbing, cyanosis, edema.  Radials/DP/PT 2+ and equal bilaterally.  Respiratory:  Respirations regular and unlabored, coarse rhonchi in all lobes.  GI: Soft, nontender, nondistended, BS + x 4. MS: no deformity or   atrophy. RUE AV fistula  Skin: warm and dry, no rash. Neuro:  Strength and sensation are intact. Psych: AAOx3.  Normal affect.  Labs    CBC  Recent Labs  09/21/16 0405 09/22/16 0930  WBC 12.9* PENDING  HGB 7.1* 8.2*  HCT 23.0* 26.9*  MCV 90.6 94.4  PLT 186 261   Basic Metabolic Panel  Recent Labs  09/21/16 0405 09/21/16 1930 09/22/16 0400  NA 138 137 137  K 5.1 4.7 4.4  CL 103 101 99*  CO2 28 26 26  GLUCOSE 180* 229* 204*  BUN 21* 22* 19  CREATININE 1.71* 1.47* 1.38*  CALCIUM 9.6 9.5 9.8  MG 2.9*  --  2.8*  PHOS 3.1 2.6 2.8   Liver Function Tests  Recent Labs  09/21/16 1930 09/22/16 0400  ALBUMIN 2.9* 3.2*   Cardiac Enzymes  Recent Labs  09/19/16 1627 09/19/16 2137  TROPONINI 0.25* 0.20*     Telemetry    STach- Personally Reviewed  ECG    NSR, non specific ST changes in inferolateral leads- Personally Reviewed  Radiology    Dg Chest Port 1  View  Result Date: 09/22/2016 CLINICAL DATA:  Endotracheal tube placement EXAM: PORTABLE CHEST 1 VIEW COMPARISON:  Yesterday FINDINGS: Endotracheal tube tip is just below the clavicular heads. Dialysis catheter on the right with tip over the SVC. An orogastric tube reaches stomach. EKG leads create artifact over the chest. Normal heart size and mediastinal contours. Clear lungs. No pneumothorax. IMPRESSION: 1. Resolved atelectasis.  No evidence of active disease. 2. Unremarkable positioning of tubes and central line. Electronically Signed   By: Jonathon  Watts M.D.   On: 09/22/2016 09:22   Dg Chest Port 1 View  Result Date: 09/21/2016 CLINICAL DATA:  Respiratory failure. Collagen vascular disease. Diabetes. Hypertension. EXAM: PORTABLE CHEST 1 VIEW COMPARISON:  One day prior FINDINGS: Endotracheal tube terminates 3.5 cm above carina.Nasogastric extends beyond the inferior aspect of the film. Right-sided internal jugular line tip at low SVC. Midline trachea. Cardiomegaly accentuated by AP portable technique. Atherosclerosis in the transverse aorta. Probable trace bilateral pleural effusions. No pneumothorax. Further improvement in aeration, with resolved interstitial edema. Decreased bibasilar atelectasis. IMPRESSION: Further improvement in aeration with resolved interstitial edema and decreased bibasilar atelectasis. Probable trace bilateral pleural effusions. Electronically Signed   By: Kyle  Talbot M.D.   On: 09/21/2016 07:38    Cardiac Studies  Transthoracic Echocardiography 09/21/16 Study Conclusions  - Left ventricle: The cavity size was normal. Wall thickness was   increased in a pattern of moderate LVH. Systolic function was   normal. The estimated ejection fraction was in the range of 55%   to 60%. Wall motion was normal; there were no regional wall   motion abnormalities. Doppler parameters are consistent with   abnormal left ventricular relaxation (grade 1 diastolic   dysfunction).  Doppler parameters are consistent with high   ventricular filling pressure. - Aortic valve: There was moderate regurgitation. - Mitral valve: There was severe regurgitation. - Left atrium: The atrium was moderately dilated.  Impressions:  - Normal LV systolic function; grade 1 diastoic dysfunction;   moderate LVH; calcificed aortic valve with moderate AI; large   oscillating density on anterior MV leaflet concerning for   vegetation (2 x 1 cm); severe MR; moderate LAE. Results discussed   with Dr Feinstein.   Patient Profile   55 year old female with ESRD (on HD MWF) secondary to lupus nephritis, DM, chronic hypotension (80s-90s), and MI. She presented to [facility]   ED 11/15 from HD with complaints of SOB and subjective feeling of face/neck swelling. She was evaluated in the ED with concern for PNA and was admitted to the hospitalists and treated with IV antibiotics.  She had some acute on chronic anemia 11/16 prompting the administration of 2 units PRBC on 11/16, after which, she developed worsening SOB ultimately resulting in respiratory arrest requiring emergent intubation.   Echo with moderate AR and  2x1cm large oscillating density on the anterior MV c/w probable veg with severe MR. For TEE today.    Assessment & Plan    1.  Acute on chronic diastolic HF with acute pulmonary edema secondary to incomplete HD with subsequent IVF resuscitation for hypotension, IV antbx and blood products.  Suspect that her severe MR on echo is also playing a role in acute decompensation as well.  EF normal on echo but does have evidence of diastolic dysfunction.   2.  Mitral valve vegetation with severe MR by TTE today. Echo in 2012 showed normal MV leaflets with no prolapse but moderate MR was present at that time.   She was febrile to 100.8 on admission but has been afebrile since then. Initial blood cx positive for coag neg staph and started on IV Vanc and subsequent blood cx neg to date.  Will plan for TEE  in am to assess MV further today  3.  Minimally elevated troponin with flat trend c/w demand ischemia in the setting of acute pulmonary edema, sepsis and ESRD.  ? Remote MI on her records - she had a troponin elevation in 2012 during acute CHF exacerbation.  She was supposed to have stress test but never got this done but had a dobutamine echo 07/2015 which showed no ischemia and normal LVF. She has never had a cath that documented MI or CAD.  Signed, Arbutus Leas, NP  09/22/2016, 10:39 AM   I have seen and examined the patient along with Arbutus Leas, NP.  I have reviewed the chart, notes and new data.  I agree with PA/NP's note.  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 Moderate LVH, normal LV EF  (65%) and wall motion. No perivalvular abscess is seen.  PLAN: Consider surgical referral for AV replacement and MV repair. Prognosis is poor due to extent and severity of endocarditis (high risk of embolism, high risk of further hemodynamic deterioration) , as well as comorbid conditions.  Sanda Klein, MD, [place] [phone] 09/22/2016, 3:40 PM

## 2016-09-22 NOTE — Progress Notes (Signed)
Pharmacy Antibiotic Note  [name] [name] is a 55 y.o. female admitted on 09/08/2016 with SOB concerning for HCAP and was originally started on Vancomycin + Zosyn - then d/c'd. The patient now intubated. BCID results this showing 2/2 BCx positive for possible MRSE. Pharmacy has been consulted to resume Vancomycin dosing.   The patient is ESRD- on HD MWF and was transitioned to CRRT on 11/17. Plan is to continue CRRT secondary to hemodynamic instability.   Plan: Continue vancomycin 750 mg q24hr VT at SS prn; goal trough 15-20 mcg/mL Will continue to follow CRRT/HD schedule/duration, culture results, LOT, and antibiotic de-escalation plans   Height: 5\' 5"  (165.1 cm) Weight: 146 lb 6.2 oz (66.4 kg) IBW/kg (Calculated) : 57  Temp (24hrs), Avg:96.2 F (35.7 C), Min:94.5 F (34.7 C), Max:97.8 F (36.6 C)   Recent Labs Lab 10/02/2016 1956 09/18/16 0008 09/18/16 0336 09/18/16 0515 09/19/16 0500  09/20/16 0500 09/20/16 1636 09/21/16 0405 09/21/16 1930 09/22/16 0400 09/22/16 0930  WBC  --   --   --  4.4 7.2  --  8.2  --  12.9*  --   --  PENDING  CREATININE  --   --  5.12*  --  4.95*  4.84*  < > 2.11* 1.78* 1.71* 1.47* 1.38*  --   LATICACIDVEN 1.42 0.55 0.9 0.6  --   --   --   --   --   --   --   --   VANCORANDOM  --   --   --   --  21  --   --   --   --   --   --   --   < > = values in this interval not displayed.  Estimated Creatinine Clearance: 41.9 mL/min (by C-G formula based on SCr of 1.38 mg/dL (H)).    Allergies  Allergen Reactions  . Sulfa Antibiotics Itching    Antimicrobials this admission: Zosyn 11/15 >> 11/16 Vancomycin 11/15 >> 11/16, 11/17>> -VR 11/17 @ 0500 = 21 11/15 >> 11/16  Dose adjustments this admission: n/a  Microbiology results: 11/15 BCx: MRSE 11/15 BCID: MRSE 11/16 MRSA PCR: neg 11/17 BCx2: ngtd  Andrey Cota. Diona Foley, PharmD, BCPS Clinical Pharmacist Pager [phone] 09/22/16 10:45 AM

## 2016-09-22 NOTE — Progress Notes (Signed)
Pts BP continues to be soft despite change in position of BP cuff and new BP cuff. Levophed started and titrated up. [facility] MD called and notified of change.

## 2016-09-22 NOTE — Progress Notes (Signed)
Patient hypotensive to the 60s. CVP checked and was 0. Juleen China, MD notified. 500cc bolus ordered. Patient fluid removal on CRRT backed down to 0 until pressures can come up. Levophed also changed to quad strength so the patient will be getting less fluid.

## 2016-09-22 NOTE — Consult Note (Signed)
[facility]  Total days of antibiotics 6        Day 6 vancomycin               Reason for Consult: endocarditis    Referring Physician: nestor  Principal Problem:   Tachycardia Active Problems:   Diabetes mellitus (HCC)   Fever   Elevated troponin level   Hypotension   End stage renal disease on dialysis (HCC)   Prolonged Q-T interval on ECG   Pancytopenia (HCC)   Acute on chronic respiratory failure with hypoxia (HCC)   Acute pulmonary edema (HCC)   Acute on chronic diastolic ACC/AHA stage C congestive heart failure (HCC)   Acute endocarditis   Mitral regurgitation    HPI: [name] [name] is a 55 y.o. 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 She was admitted on 11/15 from HD with complaints of SOB and possible increasing sweling of face. At this time she denies having fevers, SOB resolved with supplemental oxygen. She was noted to have fevers of 100.8,(interestingly her WBC was low at 2.8) in setting of hypotension and tachycardia, thus received IVF and IV abtx due to concern for PNA. she was started empirically on vancomycin and piptazo.  She also was found to have acute anemia, with Hgb 5.1, thus given 2 units RBC transfusion, unclear where source of bleeding was initiated. On 11/16 evening, she had developed worsening SOB that was unclear if related to volume overload from RBC transfusion but led to her requiring emergent intubation. Her admit blood cx grew MRSA on 11/17, thus she was continued on vancomycin. She underwent 2D echo on 11/19 which showed normal LV function though she has a  2 x 1 cm large anterior  Mitral valve vegetation with severe MR by TTE. She is planned for TEE tomorrow. She remains afebrile. Past Medical History:  Diagnosis Date  . Acute endocarditis 09/21/2016  . Anemia   . Anxiety   . Arthritis    RA  . Blood transfusion without reported diagnosis   .  Chronic ankle pain    due to RA?  Marland Kitchen Colitis    ? per hospital notes 11/2014  . Diabetes mellitus (HCC)    border line  . 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 Hemiparesis and alteration of sensations as late effects of stroke ([place]) 06/19/2016  . Hemorrhoids   . Hyperlipidemia   . Hyperplastic colon polyp   . Hypertension   . Hyponatremia   . Lower GI bleed   . Lupus   . 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 Metabolic acidosis   . Metatarsal bone fracture right   4th  . Mitral regurgitation 09/21/2016  . Myocardial infarction   . Pain in joints   . Paresthesias   . Pneumonia   . Stenosis of cervical spine region    with HNP at C5/6, C6/7  . Stroke ([place]) 11/2014   left sided weakness, dysphagia    Allergies:  Allergies  Allergen Reactions  . Sulfa Antibiotics Itching    MEDICATIONS: . sodium chloride   Intravenous Once  . calcitRIOL  1.5 mcg Oral Q M,W,F-HD  . chlorhexidine gluconate (MEDLINE KIT)  15 mL Mouth Rinse BID  . cinacalcet  30 mg Oral Q breakfast  . famotidine (PEPCID) IV  20 mg Intravenous Q24H  .  fentaNYL (SUBLIMAZE) injection  50 mcg Intravenous Once  . insulin aspart  2-6 Units Subcutaneous Q4H  . mouth rinse  15 mL Mouth Rinse QID  . multivitamin  1 tablet Oral QHS  . nortriptyline  10 mg Per Tube QHS  . polyethylene glycol  17 g Oral Q24H  . pravastatin  20 mg Oral Daily  . predniSONE  40 mg Per Tube Q breakfast  . pregabalin  75 mg Oral QHS  . sevelamer carbonate  4,800 mg Oral TID WC  . sodium chloride flush  3 mL Intravenous Q12H  . thiamine  100 mg Oral Daily  . [START ON 09/23/2016] vancomycin  750 mg Intravenous Q24H    Social History  Substance Use Topics  . Smoking status: Former Smoker    Packs/day: 0.50    Years: 30.00    Types: Cigarettes    Quit date: 09/22/2014  . Smokeless tobacco: Never Used  . Alcohol use No    Family History  Problem Relation Age of Onset  . Hypertension Mother   .  Diabetes Mother   . Stroke Mother   . Hypertension Sister   . Diabetes Father   . Hypertension Maternal Grandmother   . Hypertension    . Lupus    . Rheum arthritis    . Colon cancer Neg Hx     Review of Systems -  Unable to obtain since patient is sedated/intubated  OBJECTIVE: Temp:  [94.5 F (34.7 C)-97.4 F (36.3 C)] 97.4 F (36.3 C) (11/20 1147) Pulse Rate:  [65-141] 123 (11/20 1235) Resp:  [5-34] 20 (11/20 1400) BP: (74-146)/(21-100) 92/76 (11/20 1400) SpO2:  [100 %] 100 % (11/20 1235) FiO2 (%):  [30 %] 30 % (11/20 1235) Weight:  [146 lb 6.2 oz (66.4 kg)] 146 lb 6.2 oz (66.4 kg) (11/20 0400) Physical Exam  Constitutional:  intubated appears chronically ill. No distress.  HENT: [place]/AT, PERRLA, no scleral icterus Mouth/Throat: OETT in place Cardiovascular: Normal rate, regular rhythm and normal heart sounds. Exam reveals no gallop and no friction rub.  No murmur heard.  Pulmonary/Chest: Effort normal and breath sounds normal. No respiratory distress.  has no wheezes.  Abdominal: Soft. Bowel sounds are normal.  exhibits no distension. There is no tenderness.  Ext: warm, no edema Skin: Skin is warm and dry. No rash noted. No erythema.    LABS: Results for orders placed or performed during the hospital encounter of 09/10/2016 (from the past 48 hour(s))  Glucose, capillary     Status: Abnormal   Collection Time: 09/20/16  4:22 PM  Result Value Ref Range   Glucose-Capillary 159 (H) 65 - 99 mg/dL   Comment 1 Notify RN    Comment 2 Document in Chart   Renal function panel (daily at 1600)     Status: Abnormal   Collection Time: 09/20/16  4:36 PM  Result Value Ref Range   Sodium 135 135 - 145 mmol/L   Potassium 5.4 (H) 3.5 - 5.1 mmol/L   Chloride 102 101 - 111 mmol/L   CO2 25 22 - 32 mmol/L   Glucose, Bld 189 (H) 65 - 99 mg/dL   BUN 19 6 - 20 mg/dL   Creatinine, Ser 1.78 (H) 0.44 - 1.00 mg/dL   Calcium 9.2 8.9 - 10.3 mg/dL   Phosphorus 2.4 (L) 2.5 - 4.6 mg/dL   Albumin  2.5 (L) 3.5 - 5.0 g/dL   GFR calc non Af Amer 31 (L) >60 mL/min   GFR calc Af  Amer 36 (L) >60 mL/min    Comment: (NOTE) The eGFR has been calculated using the CKD EPI equation. This calculation has not been validated in all clinical situations. eGFR's persistently <60 mL/min signify possible Chronic Kidney Disease.    Anion gap 8 5 - 15  Magnesium     Status: Abnormal   Collection Time: 09/20/16  4:36 PM  Result Value Ref Range   Magnesium 2.6 (H) 1.7 - 2.4 mg/dL  Phosphorus     Status: Abnormal   Collection Time: 09/20/16  4:36 PM  Result Value Ref Range   Phosphorus 2.4 (L) 2.5 - 4.6 mg/dL  Glucose, capillary     Status: None   Collection Time: 09/20/16  8:06 PM  Result Value Ref Range   Glucose-Capillary 83 65 - 99 mg/dL   Comment 1 Notify RN   Glucose, capillary     Status: None   Collection Time: 09/21/16 12:06 AM  Result Value Ref Range   Glucose-Capillary 89 65 - 99 mg/dL   Comment 1 Notify RN   Magnesium     Status: Abnormal   Collection Time: 09/21/16  4:05 AM  Result Value Ref Range   Magnesium 2.9 (H) 1.7 - 2.4 mg/dL  CBC     Status: Abnormal   Collection Time: 09/21/16  4:05 AM  Result Value Ref Range   WBC 12.9 (H) 4.0 - 10.5 K/uL   RBC 2.54 (L) 3.87 - 5.11 MIL/uL   Hemoglobin 7.1 (L) 12.0 - 15.0 g/dL   HCT 23.0 (L) 36.0 - 46.0 %   MCV 90.6 78.0 - 100.0 fL   MCH 28.0 26.0 - 34.0 pg   MCHC 30.9 30.0 - 36.0 g/dL   RDW 17.1 (H) 11.5 - 15.5 %   Platelets 186 150 - 400 K/uL  Renal function panel     Status: Abnormal   Collection Time: 09/21/16  4:05 AM  Result Value Ref Range   Sodium 138 135 - 145 mmol/L   Potassium 5.1 3.5 - 5.1 mmol/L   Chloride 103 101 - 111 mmol/L   CO2 28 22 - 32 mmol/L   Glucose, Bld 180 (H) 65 - 99 mg/dL   BUN 21 (H) 6 - 20 mg/dL   Creatinine, Ser 1.71 (H) 0.44 - 1.00 mg/dL   Calcium 9.6 8.9 - 10.3 mg/dL   Phosphorus 3.1 2.5 - 4.6 mg/dL   Albumin 2.6 (L) 3.5 - 5.0 g/dL   GFR calc non Af Amer 33 (L) >60 mL/min   GFR calc Af  Amer 38 (L) >60 mL/min    Comment: (NOTE) The eGFR has been calculated using the CKD EPI equation. This calculation has not been validated in all clinical situations. eGFR's persistently <60 mL/min signify possible Chronic Kidney Disease.    Anion gap 7 5 - 15  Glucose, capillary     Status: Abnormal   Collection Time: 09/21/16  4:12 AM  Result Value Ref Range   Glucose-Capillary 174 (H) 65 - 99 mg/dL   Comment 1 Notify RN   Glucose, capillary     Status: Abnormal   Collection Time: 09/21/16  8:24 AM  Result Value Ref Range   Glucose-Capillary 111 (H) 65 - 99 mg/dL   Comment 1 Notify RN    Comment 2 Document in Chart   Glucose, capillary     Status: Abnormal   Collection Time: 09/21/16 12:22 PM  Result Value Ref Range   Glucose-Capillary 158 (H) 65 - 99 mg/dL   Comment  1 Notify RN    Comment 2 Document in Chart   Glucose, capillary     Status: Abnormal   Collection Time: 09/21/16  4:48 PM  Result Value Ref Range   Glucose-Capillary 105 (H) 65 - 99 mg/dL   Comment 1 Notify RN    Comment 2 Document in Chart   Renal function panel     Status: Abnormal   Collection Time: 09/21/16  7:30 PM  Result Value Ref Range   Sodium 137 135 - 145 mmol/L   Potassium 4.7 3.5 - 5.1 mmol/L   Chloride 101 101 - 111 mmol/L   CO2 26 22 - 32 mmol/L   Glucose, Bld 229 (H) 65 - 99 mg/dL   BUN 22 (H) 6 - 20 mg/dL   Creatinine, Ser 1.47 (H) 0.44 - 1.00 mg/dL   Calcium 9.5 8.9 - 10.3 mg/dL   Phosphorus 2.6 2.5 - 4.6 mg/dL   Albumin 2.9 (L) 3.5 - 5.0 g/dL   GFR calc non Af Amer 39 (L) >60 mL/min   GFR calc Af Amer 46 (L) >60 mL/min    Comment: (NOTE) The eGFR has been calculated using the CKD EPI equation. This calculation has not been validated in all clinical situations. eGFR's persistently <60 mL/min signify possible Chronic Kidney Disease.    Anion gap 10 5 - 15  Glucose, capillary     Status: Abnormal   Collection Time: 09/21/16  8:00 PM  Result Value Ref Range   Glucose-Capillary 128  (H) 65 - 99 mg/dL   Comment 1 Notify RN   Glucose, capillary     Status: Abnormal   Collection Time: 09/21/16 11:51 PM  Result Value Ref Range   Glucose-Capillary 189 (H) 65 - 99 mg/dL   Comment 1 Document in Chart   Renal function panel     Status: Abnormal   Collection Time: 09/22/16  4:00 AM  Result Value Ref Range   Sodium 137 135 - 145 mmol/L   Potassium 4.4 3.5 - 5.1 mmol/L   Chloride 99 (L) 101 - 111 mmol/L   CO2 26 22 - 32 mmol/L   Glucose, Bld 204 (H) 65 - 99 mg/dL   BUN 19 6 - 20 mg/dL   Creatinine, Ser 1.38 (H) 0.44 - 1.00 mg/dL   Calcium 9.8 8.9 - 10.3 mg/dL   Phosphorus 2.8 2.5 - 4.6 mg/dL   Albumin 3.2 (L) 3.5 - 5.0 g/dL   GFR calc non Af Amer 42 (L) >60 mL/min   GFR calc Af Amer 49 (L) >60 mL/min    Comment: (NOTE) The eGFR has been calculated using the CKD EPI equation. This calculation has not been validated in all clinical situations. eGFR's persistently <60 mL/min signify possible Chronic Kidney Disease.    Anion gap 12 5 - 15  Magnesium     Status: Abnormal   Collection Time: 09/22/16  4:00 AM  Result Value Ref Range   Magnesium 2.8 (H) 1.7 - 2.4 mg/dL  Glucose, capillary     Status: Abnormal   Collection Time: 09/22/16  4:12 AM  Result Value Ref Range   Glucose-Capillary 189 (H) 65 - 99 mg/dL   Comment 1 Notify RN   Glucose, capillary     Status: Abnormal   Collection Time: 09/22/16  7:43 AM  Result Value Ref Range   Glucose-Capillary 135 (H) 65 - 99 mg/dL   Comment 1 Notify RN    Comment 2 Document in Chart   CBC  Status: Abnormal   Collection Time: 09/22/16  9:30 AM  Result Value Ref Range   WBC 24.8 (H) 4.0 - 10.5 K/uL    Comment: WHITE COUNT CONFIRMED ON SMEAR RARE NRBCs    RBC 2.85 (L) 3.87 - 5.11 MIL/uL   Hemoglobin 8.2 (L) 12.0 - 15.0 g/dL   HCT 26.9 (L) 36.0 - 46.0 %   MCV 94.4 78.0 - 100.0 fL   MCH 28.8 26.0 - 34.0 pg   MCHC 30.5 30.0 - 36.0 g/dL   RDW 19.3 (H) 11.5 - 15.5 %   Platelets 261 150 - 400 K/uL  Glucose, capillary      Status: Abnormal   Collection Time: 09/22/16 11:46 AM  Result Value Ref Range   Glucose-Capillary 223 (H) 65 - 99 mg/dL   Comment 1 Notify RN    Comment 2 Document in Chart     MICRO: 11/15 blood cx CoNS 11/17 blood cx NGTD IMAGING: Dg Chest Port 1 View  Result Date: 09/22/2016 CLINICAL DATA:  Endotracheal tube placement EXAM: PORTABLE CHEST 1 VIEW COMPARISON:  Yesterday FINDINGS: Endotracheal tube tip is just below the clavicular heads. Dialysis catheter on the right with tip over the SVC. An orogastric tube reaches stomach. EKG leads create artifact over the chest. Normal heart size and mediastinal contours. Clear lungs. No pneumothorax. IMPRESSION: 1. Resolved atelectasis.  No evidence of active disease. 2. Unremarkable positioning of tubes and central line. Electronically Signed   By: Monte Fantasia M.D.   On: 09/22/2016 09:22   Dg Chest Port 1 View  Result Date: 09/21/2016 CLINICAL DATA:  Respiratory failure. Collagen vascular disease. Diabetes. Hypertension. EXAM: PORTABLE CHEST 1 VIEW COMPARISON:  One day prior FINDINGS: Endotracheal tube terminates 3.5 cm above carina.Nasogastric extends beyond the inferior aspect of the film. Right-sided internal jugular line tip at low SVC. Midline trachea. Cardiomegaly accentuated by AP portable technique. Atherosclerosis in the transverse aorta. Probable trace bilateral pleural effusions. No pneumothorax. Further improvement in aeration, with resolved interstitial edema. Decreased bibasilar atelectasis. IMPRESSION: Further improvement in aeration with resolved interstitial edema and decreased bibasilar atelectasis. Probable trace bilateral pleural effusions. Electronically Signed   By: Abigail Miyamoto M.D.   On: 09/21/2016 07:38    HISTORICAL MICRO/IMAGING  Assessment/Plan:  55yo F with lupus nephritis with ESRD on HD admitted for sepsis found to have CoNS bacteremia and new MV vegetation concern for SBE vs marantic lesion possibly associated  with lupus   - continue on vancomycin for MRSE infection. - repeat blood cx are pending from 11/17 - await results from TEE for more description of TTE findings to whetehr she would be a surgical candidate. Previous Echo in March 2016 showed moderate MR  - recommend 6 wk of vancomycin with HD for now  Respiratory failure on vent = continue with vent support, on low settings, anticipate can do PS, trial to extubate tomorrow after TEE  ESRD = receiving HD today, treatment guided by nephro team

## 2016-09-22 NOTE — Progress Notes (Signed)
PULMONARY / CRITICAL CARE MEDICINE   Name: [name] [name] MRN: [id number] DOB: 04-13-1961    ADMISSION DATE:  09/07/2016 CONSULTATION DATE:  11/16  REFERRING MD:  Dr. 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 She presented to [facility] ED 11/15 from HD with complaints of SOB and subjective feeling of face/neck swelling. She was evaluated in the ED with concern for PNA and was admitted to the hospitalists and treated with IV antibiotics.   She had some acute on chronic anemia 11/16 prompting the administration of 2 units PRBC on 11/16, after which, she developed worsening SOB ultimately resulting in respiratory arrest requiring emergent intubation. PCCM to assume care.  SIGNIFICANT EVENTS: 11/15 admit 11/16 intubated for respiratory distress 11/17: Initial blood cx with MRSE in 2/2.  11/19: TTE with density on MV leaflet concerning for vegetation.    SUBJECTIVE:  Mildly tachycardic. Per RN, with SBT yesterday had increased RR and HR.    VITAL SIGNS: BP (!) 99/43   Pulse (!) 117   Temp 97.2 F (36.2 C) (Oral)   Resp 20   Ht 5\' 5"  (1.651 m)   Wt 146 lb 6.2 oz (66.4 kg)   LMP 07/18/2012   SpO2 100%   BMI 24.36 kg/m   HEMODYNAMICS: CVP:  [4 mmHg] 4 mmHg  VENTILATOR SETTINGS: Vent Mode: PRVC FiO2 (%):  [30 %] 30 % Set Rate:  [20 bmp] 20 bmp Vt Set:  [460 mL] 460 mL PEEP:  [5 cmH20] 5 cmH20 Pressure Support:  [5 cmH20-16 cmH20] 5 cmH20 Plateau Pressure:  [15 cmH20-21 cmH20] 18 cmH20  INTAKE / OUTPUT: I/O last 3 completed shifts: In: 2524 [I.V.:714; NG/GT:1660; IV Piggyback:150] Out: 5602 [Other:5602]  PHYSICAL EXAMINATION: General:  Obese female lying in bed in NAD  Neuro:  Follows commands moves all ext HEENT:  ETT in place, PERRL Cardiovascular:  RRR s1 s2  Lungs:  Coarse Abdomen:  Soft, non-distended, no r/g Musculoskeletal:  No acute deformity. RUE AV  fistula   LABS:  BMET  Recent Labs Lab 09/21/16 0405 09/21/16 1930 09/22/16 0400  NA 138 137 137  K 5.1 4.7 4.4  CL 103 101 99*  CO2 28 26 26   BUN 21* 22* 19  CREATININE 1.71* 1.47* 1.38*  GLUCOSE 180* 229* 204*    Electrolytes  Recent Labs Lab 09/20/16 1636 09/21/16 0405 09/21/16 1930 09/22/16 0400  CALCIUM 9.2 9.6 9.5 9.8  MG 2.6* 2.9*  --  2.8*  PHOS 2.4*  2.4* 3.1 2.6 2.8    CBC  Recent Labs Lab 09/19/16 0500 09/20/16 0500 09/21/16 0405  WBC 7.2 8.2 12.9*  HGB 7.5* 7.2* 7.1*  HCT 23.1* 22.4* 23.0*  PLT 121* 156 186    Coag's  Recent Labs Lab 09/18/16 0336  APTT 29  INR 1.01    Sepsis Markers  Recent Labs Lab 09/18/16 0008 09/18/16 0336 09/18/16 0515  LATICACIDVEN 0.55 0.9 0.6  PROCALCITON  --  2.62  --     ABG  Recent Labs Lab 09/18/16 1843  PHART 7.331*  PCO2ART 54.2*  PO2ART 75.4*    Liver Enzymes  Recent Labs Lab 09/21/2016 1945  09/19/16 1027  09/21/16 0405 09/21/16 1930 09/22/16 0400  AST 20  --   --   --   --   --   --   ALT 9*  --   --   --   --   --   --  ALKPHOS 55  --   --   --   --   --   --   BILITOT 0.5  --  0.7  --   --   --   --   ALBUMIN 2.4*  < >  --   < > 2.6* 2.9* 3.2*  < > = values in this interval not displayed.  Cardiac Enzymes  Recent Labs Lab 09/19/16 1027 09/19/16 1627 09/19/16 2137  TROPONINI 0.32* 0.25* 0.20*    Glucose  Recent Labs Lab 09/21/16 0824 09/21/16 1222 09/21/16 1648 09/21/16 2000 09/21/16 2351 09/22/16 0412  GLUCAP 111* 158* 105* 128* 189* 189*    Imaging No results found.   STUDIES:  CT head 11/15 neg for acute  Echo 11/19>>>LVEF 55-60%. G1DD. MV with severe regurgitation. Large oscillating density on anterior MV leaflet concerning for vegetation.   CULTURES: Blood 11/15 >>>GPC in clusters, >Staph species (co ag neg )  Blood 11/17>>>NGTD    ANTIBIOTICS: Vanc 11/15 >>> Zosyn 11/15 > 11/16  LINES/TUBES: ETT 11/16 >>> 11/16 rt  IJ>>>  DISCUSSION: 55 year old female with ESRD on HD admitted for PNA 11/15, however, CXR at that time, given current changes, more likely represented volume overload. She likely had incomplete HD 11/15 and got volume in the way of IVF, ABX, and blood products since being admitted. I suspect she suffered flash pulmonary edema , cxr is improved after CRRT   ASSESSMENT / PLAN:  PULMONARY A: Acute hypoxemic respiratory failure secondary to pulmonary edema TRALI- concern, lower with echo result HCAP r/o  P:   Full vent support Assess for daily wean/SBT, PS needed still, wonder if valvular dysfunction cause of acute edema, NOT blood CXR VAP bundle HD for volume removal, successful Empiric steroids, wean to off    CARDIOVASCULAR A:  H/o chronic hypotension, MI TTE with MV regurg and vegitation concerning for endocarditis  P:  Telemetry monitoring Troponin, repeat TEE  Off pressors   RENAL A:   ESRD on HD P:   HD per nephrology Keep even balance    GASTROINTESTINAL A:   Nil acute  P:   NPO for now pepcid IV  feeding  HEMATOLOGIC A:   Anemia (baseline Hgb 9-10) H/o Lupus, r/o hemolysis, hapt neg, ldh neg P:  Monitor HgB  Transfuse for Hgb < 7 Assess hemolysis - neg thus far  INFECTIOUS A:   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 09/22/2016, 7:36 AM PGY-2, [facility]    ATTENDING NOTE / ATTESTATION NOTE :   I have discussed the case with the resident/APP Phill Myron DO  I agree with the resident/APP's   history, physical examination, assessment, and plans.    I have edited the above note and modified it according to our agreed history, physical examination, assessment and plan.   Briefly, pt known to have lupus nephritis, with CKD on HD, admitted with resp failure 2/2 PNA.  Got volume resuscitated.  ECHO with severe MR and  possible vegetation. MRSA on blood culture. On CVVH. (-) 3.6 L.  PE as above. Sedated, intubated. Comfortable. CTA. m at apex. Trace edema.   Acute hypoxemic hypercapneic resp failure 2/2 PNA/volume overload/ CHF pEF exacerbation with severe MR. Cont vent support. Cont Vanc. Cont CVVH. Wean off pred. Plan fr TEE today. Rpt blood cultures (-). May need surgery for severe MR, depending on TEE.  CKD on HD. Cont CVVH.   I have spent 30  minutes of critical care time with this patient today.  Family :  No family at bedside.    Monica Becton, MD 09/22/2016, 10:41 AM [facility] Pager [phone] After 3 pm or if no answer, call [phone]

## 2016-09-23 ENCOUNTER — Inpatient Hospital Stay (HOSPITAL_COMMUNITY): Payer: Managed Care, Other (non HMO)

## 2016-09-23 DIAGNOSIS — N179 Acute kidney failure, unspecified: Secondary | ICD-10-CM

## 2016-09-23 DIAGNOSIS — I330 Acute and subacute infective endocarditis: Secondary | ICD-10-CM

## 2016-09-23 LAB — POCT I-STAT 3, ART BLOOD GAS (G3+)
ACID-BASE DEFICIT: 8 mmol/L — AB (ref 0.0–2.0)
Acid-base deficit: 5 mmol/L — ABNORMAL HIGH (ref 0.0–2.0)
Acid-base deficit: 11 mmol/L — ABNORMAL HIGH (ref 0.0–2.0)
BICARBONATE: 16.3 mmol/L — AB (ref 20.0–28.0)
BICARBONATE: 14.2 mmol/L — AB (ref 20.0–28.0)
Bicarbonate: 19.1 mmol/L — ABNORMAL LOW (ref 20.0–28.0)
O2 Saturation: 98 %
O2 Saturation: 98 %
O2 Saturation: 98 %
PCO2 ART: 31 mmHg — AB (ref 32.0–48.0)
PCO2 ART: 27.1 mmHg — AB (ref 32.0–48.0)
PH ART: 7.372 (ref 7.350–7.450)
PH ART: 7.398 (ref 7.350–7.450)
PH ART: 7.316 — AB (ref 7.350–7.450)
PO2 ART: 101 mmHg (ref 83.0–108.0)
PO2 ART: 113 mmHg — AB (ref 83.0–108.0)
PO2 ART: 108 mmHg (ref 83.0–108.0)
TCO2: 17 mmol/L (ref 0–100)
TCO2: 20 mmol/L (ref 0–100)
TCO2: 15 mmol/L (ref 0–100)
pCO2 arterial: 27.9 mmHg — ABNORMAL LOW (ref 32.0–48.0)

## 2016-09-23 LAB — POCT I-STAT 3, VENOUS BLOOD GAS (G3P V)
ACID-BASE DEFICIT: 12 mmol/L — AB (ref 0.0–2.0)
BICARBONATE: 14.9 mmol/L — AB (ref 20.0–28.0)
O2 SAT: 98 %
PO2 VEN: 111 mmHg — AB (ref 32.0–45.0)
Patient temperature: 97.5
TCO2: 16 mmol/L (ref 0–100)
pCO2, Ven: 34 mmHg — ABNORMAL LOW (ref 44.0–60.0)
pH, Ven: 7.247 — ABNORMAL LOW (ref 7.250–7.430)

## 2016-09-23 LAB — APTT: aPTT: 65 seconds — ABNORMAL HIGH (ref 24–36)

## 2016-09-23 LAB — CBC
HCT: 23.1 % — ABNORMAL LOW (ref 36.0–46.0)
HCT: 22.8 % — ABNORMAL LOW (ref 36.0–46.0)
HEMATOCRIT: 29.3 % — AB (ref 36.0–46.0)
HEMATOCRIT: 19.6 % — AB (ref 36.0–46.0)
HEMOGLOBIN: 8.7 g/dL — AB (ref 12.0–15.0)
HEMOGLOBIN: 6.1 g/dL — AB (ref 12.0–15.0)
HEMOGLOBIN: 7.1 g/dL — AB (ref 12.0–15.0)
Hemoglobin: 6.9 g/dL — CL (ref 12.0–15.0)
MCH: 29.2 pg (ref 26.0–34.0)
MCH: 29.9 pg (ref 26.0–34.0)
MCH: 29 pg (ref 26.0–34.0)
MCH: 30 pg (ref 26.0–34.0)
MCHC: 29.7 g/dL — AB (ref 30.0–36.0)
MCHC: 31.1 g/dL (ref 30.0–36.0)
MCHC: 29.9 g/dL — AB (ref 30.0–36.0)
MCHC: 31.1 g/dL (ref 30.0–36.0)
MCV: 98.3 fL (ref 78.0–100.0)
MCV: 96.1 fL (ref 78.0–100.0)
MCV: 96.2 fL (ref 78.0–100.0)
MCV: 97.1 fL (ref 78.0–100.0)
PLATELETS: 60 10*3/uL — AB (ref 150–400)
PLATELETS: 54 10*3/uL — AB (ref 150–400)
Platelets: 193 10*3/uL (ref 150–400)
Platelets: 72 10*3/uL — ABNORMAL LOW (ref 150–400)
RBC: 2.98 MIL/uL — AB (ref 3.87–5.11)
RBC: 2.04 MIL/uL — AB (ref 3.87–5.11)
RBC: 2.37 MIL/uL — AB (ref 3.87–5.11)
RBC: 2.38 MIL/uL — ABNORMAL LOW (ref 3.87–5.11)
RDW: 20.9 % — ABNORMAL HIGH (ref 11.5–15.5)
RDW: 20.5 % — ABNORMAL HIGH (ref 11.5–15.5)
RDW: 18.1 % — AB (ref 11.5–15.5)
RDW: 18.3 % — ABNORMAL HIGH (ref 11.5–15.5)
WBC: 38.2 10*3/uL — ABNORMAL HIGH (ref 4.0–10.5)
WBC: 31.8 10*3/uL — ABNORMAL HIGH (ref 4.0–10.5)
WBC: 25 10*3/uL — ABNORMAL HIGH (ref 4.0–10.5)
WBC: 25.6 10*3/uL — ABNORMAL HIGH (ref 4.0–10.5)

## 2016-09-23 LAB — HEMOGLOBIN AND HEMATOCRIT, BLOOD
HEMATOCRIT: 27.2 % — AB (ref 36.0–46.0)
HEMOGLOBIN: 8.6 g/dL — AB (ref 12.0–15.0)

## 2016-09-23 LAB — GLUCOSE, CAPILLARY
GLUCOSE-CAPILLARY: 132 mg/dL — AB (ref 65–99)
GLUCOSE-CAPILLARY: 91 mg/dL (ref 65–99)
GLUCOSE-CAPILLARY: 81 mg/dL (ref 65–99)
Glucose-Capillary: <10 mg/dL — CL (ref 65–99)
Glucose-Capillary: 114 mg/dL — ABNORMAL HIGH (ref 65–99)
Glucose-Capillary: 199 mg/dL — ABNORMAL HIGH (ref 65–99)

## 2016-09-23 LAB — PREPARE RBC (CROSSMATCH)

## 2016-09-23 LAB — RENAL FUNCTION PANEL
ALBUMIN: 2.9 g/dL — AB (ref 3.5–5.0)
ANION GAP: 16 — AB (ref 5–15)
BUN: 15 mg/dL (ref 6–20)
CHLORIDE: 104 mmol/L (ref 101–111)
CO2: 18 mmol/L — ABNORMAL LOW (ref 22–32)
Calcium: 9.3 mg/dL (ref 8.9–10.3)
Creatinine, Ser: 1.32 mg/dL — ABNORMAL HIGH (ref 0.44–1.00)
GFR calc Af Amer: 52 mL/min — ABNORMAL LOW (ref >60)
GFR calc non Af Amer: 45 mL/min — ABNORMAL LOW (ref >60)
GLUCOSE: 182 mg/dL — AB (ref 65–99)
PHOSPHORUS: 4.8 mg/dL — AB (ref 2.5–4.6)
POTASSIUM: 5.7 mmol/L — AB (ref 3.5–5.1)
Sodium: 138 mmol/L (ref 135–145)

## 2016-09-23 LAB — HEPATIC FUNCTION PANEL
ALBUMIN: 2 g/dL — AB (ref 3.5–5.0)
ALT: 2584 U/L — ABNORMAL HIGH (ref 14–54)
AST: 4910 U/L — AB (ref 15–41)
Alkaline Phosphatase: 141 U/L — ABNORMAL HIGH (ref 38–126)
BILIRUBIN DIRECT: 0.5 mg/dL (ref 0.1–0.5)
Indirect Bilirubin: 0.8 mg/dL (ref 0.3–0.9)
Total Bilirubin: 1.3 mg/dL — ABNORMAL HIGH (ref 0.3–1.2)
Total Protein: 5.1 g/dL — ABNORMAL LOW (ref 6.5–8.1)

## 2016-09-23 LAB — BASIC METABOLIC PANEL
ANION GAP: 18 — AB (ref 5–15)
BUN: 8 mg/dL (ref 6–20)
CALCIUM: 7.8 mg/dL — AB (ref 8.9–10.3)
CO2: 18 mmol/L — ABNORMAL LOW (ref 22–32)
Chloride: 105 mmol/L (ref 101–111)
Creatinine, Ser: 1.15 mg/dL — ABNORMAL HIGH (ref 0.44–1.00)
GFR, EST NON AFRICAN AMERICAN: 53 mL/min — AB (ref >60)
GLUCOSE: 135 mg/dL — AB (ref 65–99)
Potassium: 4.2 mmol/L (ref 3.5–5.1)
Sodium: 141 mmol/L (ref 135–145)

## 2016-09-23 LAB — MAGNESIUM: Magnesium: 3 mg/dL — ABNORMAL HIGH (ref 1.7–2.4)

## 2016-09-23 LAB — PROTIME-INR
INR: 3.54
INR: 2.81
PROTHROMBIN TIME: 30.2 s — AB (ref 11.4–15.2)
Prothrombin Time: 36.3 seconds — ABNORMAL HIGH (ref 11.4–15.2)

## 2016-09-23 LAB — LACTIC ACID, PLASMA
LACTIC ACID, VENOUS: 11 mmol/L — AB (ref 0.5–1.9)
Lactic Acid, Venous: 11.9 mmol/L (ref 0.5–1.9)

## 2016-09-23 MED ORDER — SODIUM CHLORIDE 0.9 % IV SOLN
100.0000 mg | INTRAVENOUS | Status: DC
  Administered 2016-09-24: 100 mg via INTRAVENOUS
  Filled 2016-09-23: qty 100

## 2016-09-23 MED ORDER — METHYLPREDNISOLONE SODIUM SUCC 125 MG IJ SOLR
60.0000 mg | INTRAMUSCULAR | Status: DC
  Administered 2016-09-23: 60 mg via INTRAVENOUS
  Filled 2016-09-23: qty 2

## 2016-09-23 MED ORDER — DEXTROSE 50 % IV SOLN
INTRAVENOUS | Status: AC
  Filled 2016-09-23: qty 50

## 2016-09-23 MED ORDER — SODIUM CHLORIDE 0.9 % IV BOLUS (SEPSIS)
500.0000 mL | Freq: Once | INTRAVENOUS | Status: AC
  Administered 2016-09-23: 500 mL via INTRAVENOUS

## 2016-09-23 MED ORDER — SODIUM CHLORIDE 0.9 % IV SOLN: Freq: Once | INTRAVENOUS | Status: AC

## 2016-09-23 MED ORDER — PRISMASOL BGK 0/2.5 32-2.5 MEQ/L IV SOLN
INTRAVENOUS | Status: DC
  Administered 2016-09-23 – 2016-09-24 (×2): via INTRAVENOUS_CENTRAL
  Filled 2016-09-23 (×4): qty 5000

## 2016-09-23 MED ORDER — PIPERACILLIN-TAZOBACTAM 3.375 G IVPB 30 MIN
3.3750 g | Freq: Four times a day (QID) | INTRAVENOUS | Status: DC
  Administered 2016-09-23 – 2016-09-24 (×5): 3.375 g via INTRAVENOUS
  Filled 2016-09-23 (×9): qty 50

## 2016-09-23 MED ORDER — SODIUM CHLORIDE 0.9 % IV SOLN: Freq: Once | INTRAVENOUS | Status: DC

## 2016-09-23 MED ORDER — PRISMASOL BGK 0/2.5 32-2.5 MEQ/L IV SOLN
INTRAVENOUS | Status: DC
  Administered 2016-09-23: 09:00:00 via INTRAVENOUS_CENTRAL
  Filled 2016-09-23 (×3): qty 5000

## 2016-09-23 MED ORDER — SODIUM BICARBONATE 8.4 % IV SOLN
100.0000 meq | Freq: Once | INTRAVENOUS | Status: AC
  Administered 2016-09-23: 100 meq via INTRAVENOUS
  Filled 2016-09-23: qty 100

## 2016-09-23 MED ORDER — SODIUM BICARBONATE 8.4 % IV SOLN
50.0000 meq | Freq: Once | INTRAVENOUS | Status: AC
  Administered 2016-09-23: 50 meq via INTRAVENOUS
  Filled 2016-09-23: qty 50

## 2016-09-23 MED ORDER — SODIUM BICARBONATE 8.4 % IV SOLN
50.0000 meq | Freq: Once | INTRAVENOUS | Status: AC
  Administered 2016-09-23: 50 meq via INTRAVENOUS

## 2016-09-23 MED ORDER — METHYLPREDNISOLONE SODIUM SUCC 40 MG IJ SOLR: 30.0000 mg | Freq: Two times a day (BID) | INTRAMUSCULAR | Status: DC

## 2016-09-23 MED ORDER — EPINEPHRINE PF 1 MG/ML IJ SOLN
0.5000 ug/min | INTRAVENOUS | Status: DC
  Administered 2016-09-23: 2 ug/min via INTRAVENOUS
  Administered 2016-09-24 (×3): 20 ug/min via INTRAVENOUS
  Filled 2016-09-23 (×4): qty 4

## 2016-09-23 MED ORDER — SODIUM BICARBONATE 8.4 % IV SOLN
INTRAVENOUS | Status: AC
  Administered 2016-09-23: 50 meq via INTRAVENOUS
  Filled 2016-09-23: qty 50

## 2016-09-23 MED ORDER — THIAMINE HCL 100 MG/ML IJ SOLN
100.0000 mg | Freq: Every day | INTRAMUSCULAR | Status: DC
  Administered 2016-09-24: 100 mg via INTRAVENOUS
  Filled 2016-09-23: qty 2
  Filled 2016-09-23 (×2): qty 1

## 2016-09-23 MED ORDER — SODIUM POLYSTYRENE SULFONATE 15 GM/60ML PO SUSP
30.0000 g | Freq: Once | ORAL | Status: DC
  Filled 2016-09-23: qty 120

## 2016-09-23 MED ORDER — SODIUM BICARBONATE 8.4 % IV SOLN
INTRAVENOUS | Status: AC
  Filled 2016-09-23: qty 100

## 2016-09-23 MED ORDER — HYDROCORTISONE NA SUCCINATE PF 100 MG IJ SOLR
50.0000 mg | Freq: Four times a day (QID) | INTRAMUSCULAR | Status: DC
  Administered 2016-09-23 – 2016-09-24 (×3): 50 mg via INTRAVENOUS
  Filled 2016-09-23: qty 2
  Filled 2016-09-23 (×5): qty 1

## 2016-09-23 MED ORDER — STERILE WATER FOR INJECTION IV SOLN
INTRAVENOUS | Status: DC
  Administered 2016-09-23 – 2016-09-24 (×2): via INTRAVENOUS
  Filled 2016-09-23 (×4): qty 850

## 2016-09-23 MED ORDER — ALBUMIN HUMAN 25 % IV SOLN
25.0000 g | Freq: Four times a day (QID) | INTRAVENOUS | Status: AC
  Administered 2016-09-23 – 2016-09-24 (×4): 25 g via INTRAVENOUS
  Filled 2016-09-23: qty 50
  Filled 2016-09-23 (×7): qty 100

## 2016-09-23 MED ORDER — DEXTROSE 5 % IV SOLN
0.0000 ug/min | INTRAVENOUS | Status: DC
  Administered 2016-09-23: 50 ug/min via INTRAVENOUS
  Filled 2016-09-23: qty 1

## 2016-09-23 MED ORDER — SODIUM CHLORIDE 0.9 % IV SOLN
0.0300 [IU]/min | INTRAVENOUS | Status: DC
  Administered 2016-09-23 – 2016-09-24 (×2): 0.03 [IU]/min via INTRAVENOUS
  Filled 2016-09-23 (×2): qty 2

## 2016-09-23 MED ORDER — PHENYLEPHRINE HCL 10 MG/ML IJ SOLN
0.0000 ug/min | INTRAVENOUS | Status: DC
  Administered 2016-09-23 (×2): 400 ug/min via INTRAVENOUS
  Administered 2016-09-23: 350 ug/min via INTRAVENOUS
  Administered 2016-09-23: 300 ug/min via INTRAVENOUS
  Administered 2016-09-23: 400 ug/min via INTRAVENOUS
  Administered 2016-09-23: 50 ug/min via INTRAVENOUS
  Administered 2016-09-23: 350 ug/min via INTRAVENOUS
  Administered 2016-09-23 – 2016-09-24 (×7): 400 ug/min via INTRAVENOUS
  Filled 2016-09-23 (×14): qty 4

## 2016-09-23 MED ORDER — DEXTROSE 5 % IV SOLN
10.0000 mg | Freq: Once | INTRAVENOUS | Status: AC
  Administered 2016-09-23: 10 mg via INTRAVENOUS
  Filled 2016-09-23: qty 1

## 2016-09-23 MED ORDER — SODIUM CHLORIDE 0.9 % IV BOLUS (SEPSIS)
1000.0000 mL | Freq: Once | INTRAVENOUS | Status: AC
  Administered 2016-09-23: 1000 mL via INTRAVENOUS

## 2016-09-23 MED ORDER — SODIUM CHLORIDE 0.9 % IV SOLN
200.0000 mg | Freq: Once | INTRAVENOUS | Status: AC
  Administered 2016-09-23: 200 mg via INTRAVENOUS
  Filled 2016-09-23: qty 200

## 2016-09-23 MED ORDER — VITAMIN B-1 100 MG PO TABS
100.0000 mg | ORAL_TABLET | Freq: Every day | ORAL | Status: DC
  Administered 2016-09-23: 100 mg via ORAL
  Filled 2016-09-23 (×2): qty 1

## 2016-09-23 MED ORDER — SODIUM CHLORIDE 0.9 % IV SOLN
20.0000 ug | Freq: Once | INTRAVENOUS | Status: AC
  Administered 2016-09-23: 20 ug via INTRAVENOUS
  Filled 2016-09-23: qty 5

## 2016-09-23 NOTE — Progress Notes (Addendum)
NT informed RN of low CBG of 11. Pt on multiple pressors and pt had finger stick done for CBG. Repeat CBG done with blood draw from central line and CBG was 81.

## 2016-09-23 NOTE — Progress Notes (Signed)
Responded to page to provide support to patient and family at bedside  Family requested prayer. Prayed with patient and family. Provided ministry of presence, empathetic listening, emotional and spiritual support.  Chaplain available as needed.   09/23/16 1400  Clinical Encounter Type  Visited With Patient and family together;Health care provider  Visit Type Initial;Spiritual support  Referral From Nurse  Spiritual Encounters  Spiritual Needs Prayer;Emotional  Stress Factors  Patient Stress Factors Health changes  Family Stress Factors Exhausted;Health changes  Jaclynn Major, [place]

## 2016-09-23 NOTE — Progress Notes (Signed)
   [facility] PCCM   Hb was 6.1 from 8 this early am. Plt 78. INR 3.5. HCO3 17.  Pt has not received heparin this admission.  Pt's shock is likely hemorrhagic shock 2/2 abd bleeding on top of CKD (dysfxnal plt).  On top of septic shock. Less likely cardiogenic shock.    Plan : 1. Getting pRBC. Have 2 on hold.  2. Will order Vit K 10 mg IV and desmopressin. 3. Will give 4 u FFP now.  4. Start NaHCO3 drip at 50 mls/hr.  5. No role for protamine.  6. Too unstable to have ct scan of abd.  7. Dr. Gilford Raid aware of pt's severe AI and MR.  Need for surgical correction once she is better for abd process.  8. Family updated at bedside.   Monica Becton, MD 09/23/2016, 3:47 PM [facility] Pager [phone] After 3 pm or if no answer, call [phone]

## 2016-09-23 NOTE — Progress Notes (Addendum)
Pharmacy Antibiotic Note  [name] [name] is a 55 y.o. female admitted on 09/05/2016 with SOB concerning for HCAP and originally started on Vancomycin + Zosyn - then d/c'd.  BCID results later showed 2/2 BCx positive for possible MRSE. Pharmacy consulted to start resume Zosyn in addition to Vancomycin (already continued). Afebrile, WBC elevated at 38.2, and LA 11.9.    The patient is ESRD- on HD MWF and was transitioned to CRRT on 11/17. Plan is to continue CRRT secondary to hemodynamic instability.   Addendum: Also starting Eraxis for possible fungal source.   Plan: Start Zosyn 3.375g IV q6hr (30 min infusion) Start Eraxis 200mg  x1 then 100mg  q24h Continue vancomycin 750 mg q24hr Vancomycin trough at SS prn; goal trough 15-20 mcg/mL Will continue to follow CRRT/HD schedule/duration, culture results, LOT, and antibiotic de-escalation plans   Height: 5\' 5"  (165.1 cm) Weight: 149 lb 14.6 oz (68 kg) IBW/kg (Calculated) : 57  Temp (24hrs), Avg:96.2 F (35.7 C), Min:94.7 F (34.8 C), Max:97.7 F (36.5 C)   Recent Labs Lab 09/16/2016 1956 09/18/16 0008 09/18/16 0336 09/18/16 0515 09/19/16 0500  09/20/16 0500  09/21/16 0405 09/21/16 1930 09/22/16 0400 09/22/16 0930 09/22/16 1524 09/23/16 0155 09/23/16 0200 09/23/16 0518  WBC  --   --   --  4.4 7.2  --  8.2  --  12.9*  --   --  24.8*  --  38.2*  --   --   CREATININE  --   --  5.12*  --  4.95*  4.84*  < > 2.11*  < > 1.71* 1.47* 1.38*  --  1.22*  --  1.32*  --   LATICACIDVEN 1.42 0.55 0.9 0.6  --   --   --   --   --   --   --   --   --   --   --  11.9*  VANCORANDOM  --   --   --   --  21  --   --   --   --   --   --   --   --   --   --   --   < > = values in this interval not displayed.  Estimated Creatinine Clearance: 43.8 mL/min (by C-G formula based on SCr of 1.32 mg/dL (H)).    Allergies  Allergen Reactions  . Sulfa Antibiotics Itching    Antimicrobials this admission: Zosyn 11/15 >> 11/16, 11/21>>  Vancomycin 11/15 >>  11/16, 11/17>> -VR 11/17 @ 0500 = 21 11/15 >> 11/16 Eraxis 11/21 >>   Dose adjustments this admission: n/a  Microbiology results: 11/15 BCx: MRSE 11/15 BCID: MRSE 11/16 MRSA PCR: neg 11/17 BCx2: ngtd  Argie Ramming, PharmD Pharmacy Resident  Pager [phone] 09/23/16 7:48 AM

## 2016-09-23 NOTE — Progress Notes (Signed)
Patient hypotensive again to the 60s. Levo maxed out, fentanyl gtt paused and CRRT patient fluid removal changed to 0. Juleen China, MD made aware. ABG drawn. Neo ordered and to be administered when it arrives from pharmacy.

## 2016-09-23 NOTE — Procedures (Signed)
Central Venous Catheter Insertion Procedure Note KARIMA LEHNHOFF [id number] [birth year]  Procedure: Insertion of Central Venous Catheter Indications: Drug and/or fluid administration  Procedure Details Consent: Unable to obtain consent because of emergent medical necessity. Time Out: Verified patient identification, verified procedure, site/side was marked, verified correct patient position, special equipment/implants available, medications/allergies/relevent history reviewed, required imaging and test results available.  Performed  Maximum sterile technique was used including antiseptics, cap, gloves, gown, hand hygiene, mask and sheet. Skin prep: Chlorhexidine; local anesthetic administered A antimicrobial bonded/coated triple lumen catheter was placed in the right femoral vein due to emergent situation using the Seldinger technique. Ultrasound guidance used.Yes.   Catheter placed to 20 cm. Blood aspirated via all 3 ports and then flushed x 3. Line sutured x 2 and dressing applied.  Evaluation Blood flow good Complications: No apparent complications Patient did tolerate procedure well.   Richardson Landry Emoni Yang ACNP Maryanna Shape PCCM Pager [phone] till 3 pm If no answer page [phone] 09/23/2016, 8:09 AM

## 2016-09-23 NOTE — Progress Notes (Signed)
eLink Physician-Brief Progress Note Patient Name: [name] [name] DOB: 1961/09/11 MRN: [id number]   Date of Service  09/23/2016  HPI/Events of Note  persistent acidosis 7.23/34  eICU Interventions  Increase bicarb to 75/hr     Intervention Category Major Interventions: Acid-Base disturbance - evaluation and management  Flora Lipps 09/23/2016, 6:13 PM

## 2016-09-23 NOTE — Progress Notes (Signed)
Patient Name: [name] [name] Date of Encounter: 09/23/2016  Primary Cardiologist: New, Dr. Sundra Aland Problem List     Principal Problem:   Tachycardia Active Problems:   Diabetes mellitus ([place])   Fever   Elevated troponin level   Hypotension   End stage renal disease on dialysis (HCC)   Prolonged Q-T interval on ECG   Pancytopenia (HCC)   Acute on chronic respiratory failure with hypoxia (HCC)   Acute pulmonary edema (HCC)   Acute on chronic diastolic ACC/AHA stage C congestive heart failure (HCC)   Acute endocarditis   Mitral regurgitation   Severe aortic insufficiency   Acute bacterial endocarditis   AKI (acute kidney injury) ([place])     Subjective   Intubated, sedated.  Acutely decompensated last night - no change in respiratory status , but now on 4 pressors. Hgb level dropped - ? Due GIB with CVVH related Heparin.   Inpatient Medications    Scheduled Meds: . sodium chloride   Intravenous Once  . sodium chloride   Intravenous Once  . sodium chloride   Intravenous Once  . albumin human  25 g Intravenous Q6H  . [START ON 26-Sep-2016] anidulafungin  100 mg Intravenous Q24H  . calcitRIOL  1.5 mcg Oral Q M,W,F-HD  . chlorhexidine gluconate (MEDLINE KIT)  15 mL Mouth Rinse BID  . cinacalcet  30 mg Oral Q breakfast  . desmopressin (DDAVP) IV  20 mcg Intravenous Once  . dextrose      . famotidine (PEPCID) IV  20 mg Intravenous Q24H  . fentaNYL (SUBLIMAZE) injection  50 mcg Intravenous Once  . insulin aspart  2-6 Units Subcutaneous Q4H  . mouth rinse  15 mL Mouth Rinse QID  . [START ON 09-26-16] methylPREDNISolone (SOLU-MEDROL) injection  30 mg Intravenous Q12H  . multivitamin  1 tablet Oral QHS  . nortriptyline  10 mg Per Tube QHS  . phytonadione (VITAMIN K) IV  10 mg Intravenous Once  . piperacillin-tazobactam  3.375 g Intravenous Q6H  . polyethylene glycol  17 g Oral Q24H  . pravastatin  20 mg Oral Daily  . pregabalin  75 mg Oral QHS  . sevelamer  carbonate  4,800 mg Oral TID WC  . sodium chloride flush  3 mL Intravenous Q12H  . thiamine  100 mg Oral Daily   Or  . thiamine injection  100 mg Intravenous Daily  . vancomycin  750 mg Intravenous Q24H   Continuous Infusions: . epinephrine 20 mcg/min (09/23/16 1528)  . feeding supplement (VITAL AF 1.2 CAL) Stopped (09/23/16 0759)  . fentaNYL infusion INTRAVENOUS 50 mcg/hr (09/23/16 0815)  . norepinephrine (LEVOPHED) Adult infusion 75 mcg/min (09/23/16 1507)  . phenylephrine (NEO-SYNEPHRINE) Adult infusion 400 mcg/min (09/23/16 1400)  . dialysis replacement fluid (prismasate) 300 mL/hr at 09/23/16 0913  . dialysis replacement fluid (prismasate) 200 mL/hr at 09/23/16 0913  . dialysate (PRISMASATE) 2,000 mL/hr at 09/23/16 1345  . vasopressin (PITRESSIN) infusion - *FOR SHOCK* 0.03 Units/min (09/23/16 0800)   PRN Meds: calcium carbonate (dosed in mg elemental calcium), fentaNYL, heparin, midazolam, polyvinyl alcohol, sevelamer carbonate, sorbitol   Vital Signs    Vitals:   09/23/16 1445 09/23/16 1500 09/23/16 1515 09/23/16 1530  BP:      Pulse:      Resp: (!) 28 (!) 24 (!) 24 (!) 22  Temp:      TempSrc:      SpO2:      Weight:      Height:  Intake/Output Summary (Last 24 hours) at 09/23/16 1539 Last data filed at 09/23/16 1500  Gross per 24 hour  Intake          6406.13 ml  Output             2995 ml  Net          3411.13 ml   Filed Weights   09/21/16 0430 09/22/16 0400 09/23/16 0351  Weight: 69 kg (152 lb 1.9 oz) 66.4 kg (146 lb 6.2 oz) 68 kg (149 lb 14.6 oz)    Physical Exam   GEN: Well nourished, well developed, in no acute distress.  HEENT: Grossly normal. ETT present Neck: Supple, no JVD, carotid bruits, or masses. Cardiac: RRR, no murmurs, rubs, or gallops. No clubbing, cyanosis, edema.  Radials/DP/PT 2+ and equal bilaterally.  Respiratory:  Respirations regular and unlabored, coarse rhonchi in all lobes.  GI: Soft, nontender, nondistended, BS + x  4. MS: no deformity or atrophy. RUE AV fistula  Skin: warm and dry, no rash. Neuro:  Strength and sensation are intact. Psych: AAOx3.  Normal affect.  Labs    CBC  Recent Labs  09/23/16 0155 09/23/16 1345  WBC 38.2* 31.8*  HGB 8.7* 6.1*  HCT 29.3* 19.6*  MCV 98.3 96.1  PLT 193 72*   Basic Metabolic Panel  Recent Labs  09/22/16 0400 09/22/16 1524 09/23/16 0200  NA 137 140 138  K 4.4 4.8 5.7*  CL 99* 104 104  CO2 26 25 18*  GLUCOSE 204* 195* 182*  BUN '19 17 15  '$ CREATININE 1.38* 1.22* 1.32*  CALCIUM 9.8 9.6 9.3  MG 2.8*  --  3.0*  PHOS 2.8 2.9 4.8*   Liver Function Tests  Recent Labs  09/22/16 1524 09/23/16 0200  ALBUMIN 3.1* 2.9*   Cardiac Enzymes No results for input(s): CKTOTAL, CKMB, CKMBINDEX, TROPONINI in the last 72 hours.   Telemetry    STach- Personally Reviewed  ECG    NSR, non specific ST changes in inferolateral leads- Personally Reviewed  Radiology    Dg Chest Port 1 View  Result Date: 09/23/2016 CLINICAL DATA:  Hypoxia EXAM: PORTABLE CHEST 1 VIEW COMPARISON:  September 22, 2016 FINDINGS: Endotracheal tube tip is 2.0 cm above the carina. Nasogastric tube tip and side port are below the diaphragm. Central catheter tip is in the superior vena cava. No pneumothorax. There is mild atelectasis in the left mid lower lung zones. Lungs elsewhere are clear. Heart size and pulmonary vascularity are normal. No adenopathy. No bone lesions. There is atherosclerotic calcification in the aorta. IMPRESSION: Tube and catheter positions as described without pneumothorax. Mild atelectasis left mid lower lung zones. No new opacity. Stable cardiac silhouette. Aortic atherosclerosis. Electronically Signed   By: Lowella Grip III M.D.   On: 09/23/2016 08:07   Dg Chest Port 1 View  Result Date: 09/22/2016 CLINICAL DATA:  Endotracheal tube placement EXAM: PORTABLE CHEST 1 VIEW COMPARISON:  Yesterday FINDINGS: Endotracheal tube tip is just below the clavicular  heads. Dialysis catheter on the right with tip over the SVC. An orogastric tube reaches stomach. EKG leads create artifact over the chest. Normal heart size and mediastinal contours. Clear lungs. No pneumothorax. IMPRESSION: 1. Resolved atelectasis.  No evidence of active disease. 2. Unremarkable positioning of tubes and central line. Electronically Signed   By: Monte Fantasia M.D.   On: 09/22/2016 09:22   Dg Abd Portable 1v  Result Date: 09/23/2016 CLINICAL DATA:  Abdominal pain EXAM: PORTABLE ABDOMEN - 1  VIEW COMPARISON:  None. FINDINGS: Scattered large and small bowel gas is noted. Fecal material is noted within the colon although no obstructive changes are seen. Nasogastric catheter is noted within the stomach. Right femoral line is noted with the tip at the junction of the iliac veins. No definitive free air is seen. Diffuse aortoiliac calcifications are noted. No acute bony abnormality is noted. IMPRESSION: Tubes and lines as described. No acute abnormality seen. Electronically Signed   By: Inez Catalina M.D.   On: 09/23/2016 08:18    Cardiac Studies  Transthoracic Echocardiography 09/21/16 Study Conclusions  - Left ventricle: The cavity size was normal. Wall thickness was   increased in a pattern of moderate LVH. Systolic function was   normal. The estimated ejection fraction was in the range of 55%   to 60%. Wall motion was normal; there were no regional wall   motion abnormalities. Doppler parameters are consistent with   abnormal left ventricular relaxation (grade 1 diastolic   dysfunction). Doppler parameters are consistent with high   ventricular filling pressure. - Aortic valve: There was moderate regurgitation. - Mitral valve: There was severe regurgitation. - Left atrium: The atrium was moderately dilated.  Impressions:  - Normal LV systolic function; grade 1 diastoic dysfunction;   moderate LVH; calcificed aortic valve with moderate AI; large   oscillating density on  anterior MV leaflet concerning for   vegetation (2 x 1 cm); severe MR; moderate LAE. Results discussed   with Dr Titus Mould.   TEE 11/20:  EF 65-70%, hyperdynamic no RWMA.   - Aortic valve: Mild diffuse thickening, consistent with rheumatic   disease. There was a medium-sized, 1.0 cm (L) x 0.4 cm (W),   mobile vegetation on the left ventricular aspect of the   noncoronary cusp. There was severe regurgitation directed   centrally in the LVOT. - Mitral valve: Moderate thickening, limited to the leaflet margin,   consistent with rheumatic disease. There was a large, 1.5 cm (L)   x 0.8 cm (W), irregular, hyperechoic, highly mobile vegetation on   the atrial aspect of the anterior leaflet, in the vicinity of the   anterolateral commisure (A1-P1). There was mild to moderate   regurgitation directed centrally. - Left atrium: The atrium was dilated. No evidence of thrombus in   the atrial cavity or appendage. No evidence of thrombus in the   appendage.  Patient Profile   55 year old female with ESRD (on HD MWF) secondary to lupus nephritis, DM, chronic hypotension (80s-90s), and MI. She [facility] ED 11/15 from HD with complaints of SOB and subjective feeling of face/neck swelling. She was evaluated in the ED with concern for PNA and was admitted to the hospitalists and treated with IV antibiotics.  She had some acute on chronic anemia 11/16 prompting the administration of 2 units PRBC on 11/16, after which, she developed worsening SOB ultimately resulting in respiratory arrest requiring emergent intubation.   Echo & TEE reviewed.   Assessment & Plan    1.  Acute on chronic diastolic HF with acute pulmonary edema secondary to incomplete HD with subsequent IVF resuscitation for hypotension, IV antbx and blood products. MR & AI are making BP control & volume management difficult.   2.  Mitral valve & Aortic Valvevegetation with Mod MR & Severe AI by TEE yesterday. Echo in 2012 showed  normal MV leaflets with no prolapse but moderate MR was present at that time.    - blood Cx + &  Vegetations - for IE.    3.  Minimally elevated troponin with flat trend c/w demand ischemia in the setting of acute pulmonary edema, sepsis and ESRD.   Anemia = probably the cause of acute decompensation - hopefully with transfusion, her BP with recover.    At best, she is not a good surgical candidate with ESRD, but if she survives her bleeding & IE /Sepsis, will need AVR/MVR at some point.  - I have notified CT SGx, but @ present she is clearly not a surgical candidate.  Need to clear infxn & shock.   We are following along, but not much to add from a Cardiac Perspective - volume control per CVVHD, on Pressors & needs transfusion.  Critically ill.  I did spend time discussing TEE results with the family.  Signed, Glenetta Hew, MD  09/23/2016, 3:39 PM

## 2016-09-23 NOTE — Progress Notes (Signed)
CVVHD management.  Hypotension has worsened and back on pressors, vasopressin and norepi.  Abd quite tender and concerned for intrabdominal perf or ischemia.  K sl high, will change replacement fluid to 0K.   Recommend CT abd and discussed with CC NP.

## 2016-09-23 NOTE — Progress Notes (Signed)
MD paged and made aware of ABG results. New orders for 2 amps Bicarb now and stat labs. Will continue to monitor.

## 2016-09-23 NOTE — Progress Notes (Signed)
CRITICAL VALUE ALERT  Critical value received:  Lactic acid 11.0  Date of notification:  09/23/16  Time of notification:  0830  Critical value read back:Yes.    Nurse who received alert:  Anneita Minor  MD notified (1st page):  Corrie Dandy  Time of first page:  0915  MD notified (2nd page):  Time of second page:  Responding MD:  Tennis Must dios  Time MD responded:  [phone]

## 2016-09-23 NOTE — Progress Notes (Signed)
CRITICAL VALUE ALERT  Critical value received:  Lactic acid 11.9  Date of notification:  09/23/16  Time of notification:  0720  Critical value read back:Yes.    Nurse who received alert:  Allegra Grana, RN  MD notified (1st page):  Dr Corrie Dandy  Time of first page:  0720; on floor  MD notified (2nd page):  Time of second page:  Responding MD:  Dr Corrie Dandy  Time MD responded: [phone]; on floor

## 2016-09-23 NOTE — Progress Notes (Addendum)
Nutrition Follow-up  DOCUMENTATION CODES:   Not applicable  INTERVENTION:    Consider bowel regimen, no BM documented since admission.  Resume TF as able: Vital AF 1.2 at 50 ml/h (1200 ml per day) to provide 1440 kcals, 90 gm protein, 972 ml free water daily.  NUTRITION DIAGNOSIS:   Inadequate oral intake related to inability to eat as evidenced by NPO status.  Ongoing  GOAL:   Patient will meet greater than or equal to 90% of their needs  Met with TF  MONITOR:   Vent status, TF tolerance, Labs, Weight trends, Skin, I & O's  ASSESSMENT:   55 y.o. 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   She was given oxygen and then the SOB resolved.  Her relatives thought her face was swelling after HD and so they called 911.    Discussed patient in ICU rounds and with RN today. Concern for acute abdominal process, Abdominal CT with contrast ordered. No BM documented since admission. Now requiring CRRT and on pressors. TF currently on hold. Previously received Vital AF 1.2 at 50 ml/h. Patient is currently intubated on ventilator support MV: 8.9 L/min Temp (24hrs), Avg:95.8 F (35.4 C), Min:94.3 F (34.6 C), Max:97.7 F (36.5 C)  Labs reviewed: potassium, magnesium, & phosphorus are elevated. Medications reviewed and include Prednisone, Renal MVI, thiamine.  Diet Order:  Diet NPO time specified  Skin:  Reviewed, no issues  Last BM:  unknown  Height:   Ht Readings from Last 1 Encounters:  09/18/16 '5\' 5"'$  (1.651 m)    Weight:   Wt Readings from Last 1 Encounters:  09/23/16 149 lb 14.6 oz (68 kg)    Ideal Body Weight:  56.8 kg  BMI:  Body mass index is 24.95 kg/m.  Estimated Nutritional Needs:   Kcal:  1410  Protein:  90-105 grams  Fluid:  1.6 L/day  EDUCATION NEEDS:   No education needs identified at this  time  Molli Barrows, [place], [place], [place] Pager [phone] After Hours Pager [phone]

## 2016-09-23 NOTE — Progress Notes (Signed)
Patient ID: [name] [name], female   DOB: 08-31-1961, 55 y.o.   MRN: [id number]  Cardiothoracic Surgery  Consulted by cardiology to evaluate this patient for surgical treatment of mitral endocarditis with severe MR and moderate AI. She is obviously not an operative candidate in shock on high dose levophed and vasopressin, intubated, with possible acute abdominal process. Please call us back if she recovers.

## 2016-09-23 NOTE — Progress Notes (Signed)
CRITICAL VALUE ALERT  Critical value received:  Hemoglobin 6.1  Date of notification:  09/23/16  Time of notification: [id number]  Critical value read back:Yes.    Nurse who received alert:  Allegra Grana, RN  MD notified (1st page):  Dr Corrie Dandy  Time of first page:  1425  MD notified (2nd page):  Time of second page:  Responding MD:  Dr Corrie Dandy  Time MD responded:  [phone]

## 2016-09-23 NOTE — Progress Notes (Signed)
CRITICAL VALUE ALERT  Critical value received:  AST 4910, ALT 2584  Date of notification:  09/23/16  Time of notification:  1700  Critical value read back:Yes.    Nurse who received alert:  Allegra Grana, RN  MD notified (1st page):  Warren Lacy MD  Time of first page:  1700  MD notified (2nd page):  Time of second page:  Responding MD:  Warren Lacy MD  Time MD responded:  [id number]

## 2016-09-23 NOTE — Care Management Note (Signed)
Case Management Note  Patient Details  Name: [name] [name] MRN: [id number] Date of Birth: 14-Jul-1961  Subjective/Objective:  Pt admitted with shortness of breath post HD                Action/Plan:  PTA from home with children.  Hx of Lupus and ESRD on outpt HD.  CM will continue to follow for discharge needs   Expected Discharge Date:                  Expected Discharge Plan:  Home/Self Care  In-House Referral:     Discharge planning Services  CM Consult  Post Acute Care Choice:    Choice offered to:     DME Arranged:    DME Agency:     HH Arranged:    HH Agency:     Status of Service:  In process, will continue to follow  If discussed at Long Length of Stay Meetings, dates discussed:    Additional Comments: 09/23/2016  Discussed in LOS 09/23/16: Pt remains appropriate for continued stay.  Pt remains on ventilator, now positive for endocarditis - cardiothoracic surgery had deemed pt not a candidate for surgery at this time- pt is on pressors and CRRT.  CM will continue to follow for discharge needs  09/19/16 Pt remains on ventilator Maryclare Labrador, RN 09/23/2016, 10:15 AM

## 2016-09-23 NOTE — Progress Notes (Signed)
CRITICAL VALUE ALERT  Critical value received:  Hemoglobin 6.9  Date of notification:  09/23/16  Time of notification:  [id number]  Critical value read back:Yes.    Nurse who received alert:  Allegra Grana, RN  MD notified (1st page):  Warren Lacy MD  Time of first page:  1845  MD notified (2nd page):  Time of second page:  Responding MD:  Warren Lacy MD  Time MD responded:  [phone]

## 2016-09-23 NOTE — Progress Notes (Signed)
[facility] Progress Note Patient Name: [name] [name] DOB: Dec 11, 1960 MRN: [id number]   Date of Service  09/23/2016  HPI/Events of Note  hgb =6.9  eICU Interventions  Will transfuse 1 unit PRBC     Intervention Category Major Interventions: Hemorrhage - evaluation and management  Altagracia Rone 09/23/2016, 7:16 PM

## 2016-09-23 NOTE — Progress Notes (Signed)
PULMONARY / CRITICAL CARE MEDICINE   Name: [name] [name] MRN: [id number] DOB: Apr 03, 1961    ADMISSION DATE:  09/23/2016 CONSULTATION DATE:  11/16  REFERRING MD:  Dr. 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 She presented to [facility] ED 11/15 from HD with complaints of SOB and subjective feeling of face/neck swelling. She was evaluated in the ED with concern for PNA and was admitted to the hospitalists and treated with IV antibiotics.   She had some acute on chronic anemia 11/16 prompting the administration of 2 units PRBC on 11/16, after which, she developed worsening SOB ultimately resulting in respiratory arrest requiring emergent intubation. PCCM to assume care.  SIGNIFICANT EVENTS: 11/15 admit 11/16 intubated for respiratory distress 11/17: Initial blood cx with MRSE in 2/2.  11/19: TTE with density on MV leaflet concerning for vegetation.   SUBJECTIVE:  Patient hypotensive overnight and tachycardic. Started on pressors without significant improvement in stability of vital signs.  Looks  Moribund. Abdomen distended with dec BS.   VITAL SIGNS: BP (!) 69/53   Pulse (!) 131   Temp 97.7 F (36.5 C) (Oral)   Resp (!) 23   Ht 5\' 5"  (1.651 m)   Wt 68 kg (149 lb 14.6 oz)   LMP 07/18/2012   SpO2 100%   BMI 24.95 kg/m   HEMODYNAMICS: CVP:  [0 mmHg-2 mmHg] 2 mmHg  VENTILATOR SETTINGS: Vent Mode: PRVC FiO2 (%):  [30 %] 30 % Set Rate:  [20 bmp] 20 bmp Vt Set:  [460 mL] 460 mL PEEP:  [5 cmH20] 5 cmH20 Plateau Pressure:  [15 cmH20-16 cmH20] 15 cmH20  INTAKE / OUTPUT: I/O last 3 completed shifts: In: 4466.1 [I.V.:1527.7; Other:100; NG/GT:1138.3; IV Piggyback:1700] Out: 2874 [Other:2874]  PHYSICAL EXAMINATION: General:  Obese female lying in bed. Moribund.  Neuro:  Eyes open and moving. Not following commands. Not tracking.  HEENT:  ETT in place,  PERRL Cardiovascular:  RRR s1 s2 . (-) audible m. (-) rub/gallop.  Lungs:  Good ae. Some bibasilar  crackles. (-) rhonchi/wheeze.  Abdomen:  Soft, diffusely tender to palpation, distended . Very diminished BS.  Musculoskeletal:  No acute deformity. RUE AV fistula. Warm ext (with bear hugger). (-) mottling   LABS:  BMET  Recent Labs Lab 09/22/16 0400 09/22/16 1524 09/23/16 0200  NA 137 140 138  K 4.4 4.8 5.7*  CL 99* 104 104  CO2 26 25 18*  BUN 19 17 15   CREATININE 1.38* 1.22* 1.32*  GLUCOSE 204* 195* 182*    Electrolytes  Recent Labs Lab 09/21/16 0405  09/22/16 0400 09/22/16 1524 09/23/16 0200  CALCIUM 9.6  < > 9.8 9.6 9.3  MG 2.9*  --  2.8*  --  3.0*  PHOS 3.1  < > 2.8 2.9 4.8*  < > = values in this interval not displayed.  CBC  Recent Labs Lab 09/21/16 0405 09/22/16 0930 09/23/16 0155  WBC 12.9* 24.8* 38.2*  HGB 7.1* 8.2* 8.7*  HCT 23.0* 26.9* 29.3*  PLT 186 261 193    Coag's  Recent Labs Lab 09/18/16 0336  APTT 29  INR 1.01    Sepsis Markers  Recent Labs Lab 09/18/16 0336 09/18/16 0515 09/23/16 0518  LATICACIDVEN 0.9 0.6 11.9*  PROCALCITON 2.62  --   --     ABG  Recent Labs Lab 09/18/16 1843 09/23/16 0430 09/23/16 0744  PHART 7.331* 7.372  7.398  PCO2ART 54.2* 27.9* 31.0*  PO2ART 75.4* 101.0 113.0*    Liver Enzymes  Recent Labs Lab 09/03/2016 1945  09/19/16 1027  09/22/16 0400 09/22/16 1524 09/23/16 0200  AST 20  --   --   --   --   --   --   ALT 9*  --   --   --   --   --   --   ALKPHOS 55  --   --   --   --   --   --   BILITOT 0.5  --  0.7  --   --   --   --   ALBUMIN 2.4*  < >  --   < > 3.2* 3.1* 2.9*  < > = values in this interval not displayed.  Cardiac Enzymes  Recent Labs Lab 09/19/16 1027 09/19/16 1627 09/19/16 2137  TROPONINI 0.32* 0.25* 0.20*    Glucose  Recent Labs Lab 09/22/16 1146 09/22/16 1606 09/22/16 2001 09/22/16 2358 09/23/16 0329 09/23/16 0753  GLUCAP 223* 173* 148* 166* 132* 91     Imaging Dg Chest Port 1 View  Result Date: 09/23/2016 CLINICAL DATA:  Hypoxia EXAM: PORTABLE CHEST 1 VIEW COMPARISON:  September 22, 2016 FINDINGS: Endotracheal tube tip is 2.0 cm above the carina. Nasogastric tube tip and side port are below the diaphragm. Central catheter tip is in the superior vena cava. No pneumothorax. There is mild atelectasis in the left mid lower lung zones. Lungs elsewhere are clear. Heart size and pulmonary vascularity are normal. No adenopathy. No bone lesions. There is atherosclerotic calcification in the aorta. IMPRESSION: Tube and catheter positions as described without pneumothorax. Mild atelectasis left mid lower lung zones. No new opacity. Stable cardiac silhouette. Aortic atherosclerosis. Electronically Signed   By: Lowella Grip III M.D.   On: 09/23/2016 08:07   Dg Chest Port 1 View  Result Date: 09/22/2016 CLINICAL DATA:  Endotracheal tube placement EXAM: PORTABLE CHEST 1 VIEW COMPARISON:  Yesterday FINDINGS: Endotracheal tube tip is just below the clavicular heads. Dialysis catheter on the right with tip over the SVC. An orogastric tube reaches stomach. EKG leads create artifact over the chest. Normal heart size and mediastinal contours. Clear lungs. No pneumothorax. IMPRESSION: 1. Resolved atelectasis.  No evidence of active disease. 2. Unremarkable positioning of tubes and central line. Electronically Signed   By: Monte Fantasia M.D.   On: 09/22/2016 09:22   Dg Abd Portable 1v  Result Date: 09/23/2016 CLINICAL DATA:  Abdominal pain EXAM: PORTABLE ABDOMEN - 1 VIEW COMPARISON:  None. FINDINGS: Scattered large and small bowel gas is noted. Fecal material is noted within the colon although no obstructive changes are seen. Nasogastric catheter is noted within the stomach. Right femoral line is noted with the tip at the junction of the iliac veins. No definitive free air is seen. Diffuse aortoiliac calcifications are noted. No acute bony abnormality is noted.  IMPRESSION: Tubes and lines as described. No acute abnormality seen. Electronically Signed   By: Inez Catalina M.D.   On: 09/23/2016 08:18     STUDIES:  CT head 11/15 neg for acute  Echo 11/19>>>LVEF 55-60%. G1DD. MV with severe regurgitation. Large oscillating density on anterior MV leaflet concerning for vegetation.   CULTURES: Blood 11/15 >>>GPC in clusters, >Staph species (co ag neg )  Blood 11/17>>>NGTD    ANTIBIOTICS: Vanc 11/15 >>> Zosyn 11/15 > 11/16, 11/21 >>   LINES/TUBES: ETT 11/16 >>> 11/16 rt IJ>>>  DISCUSSION: 55 year  old female with ESRD on HD admitted for PNA 11/15, however, CXR at that time, given current changes, more likely represented volume overload. She likely had incomplete HD 11/15 and got volume in the way of IVF, ABX, and blood products since being admitted. I suspect she suffered flash pulmonary edema , cxr is improved after CRRT   ASSESSMENT / PLAN:  PULMONARY A: Acute hypoxemic respiratory failure secondary to pulmonary edema, severe sepsis, infective endocarditis,  lactic acidosis Less likely HCAP given CXR findings.   P:   Full vent support Assess for daily wean/SBT > no wean today CXR VAP bundle Empiric steroids > switch to IV > plan to taper off.  CRRT    CARDIOVASCULAR A:  Infective Endocarditis. 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 EF65-70% H/o chronic hypotension, MI Shock  P:  Telemetry monitoring Levophed and Vasopressin and Neosynephrine for shock. Cont abx.  May need surgical intervention but too critical at the moment.     RENAL A:   ESRD on HD Lactic Acidosis  P:   HD per nephrology Trend lactic acid  Will bolus 1L > try to keep euvolemic, (+) mildly positive.    GASTROINTESTINAL A:   New abdominal pain concerning for process such as septic emboli. Possible Acute Abdomen.  P:   NPO for now pepcid IV  Feeding > hiold. NGT to suction KUB was U/R. Will need abd/pelvis  ct scan once more stable. Too sick to go down.   HEMATOLOGIC A:   Anemia (baseline Hgb 9-10) H/o Lupus, r/o hemolysis, hapt neg, ldh neg P:  Monitor HgB  Transfuse for Hgb < 7 Assess hemolysis - neg thus far  INFECTIOUS A:   MRSE Bacteremia NO hardware in place chronic New abdominal pain concerning for process such as septic emboli or acute abd Leukocytosis, rising significantly  P:   Vancomycin  Add Zosyn 11/21. Will add Eraxis 11/21. Not sure about fungal infection. Will get fungal blood culture. Will also d/w ID.  KUB  Trend lactic acid  Pan-culture  ID following, appreciate recs   ENDOCRINE A:   DM P:   CBG monitoring and SSI  NEUROLOGIC A:   Acute encephalopathy, resolved  P:   RASS goal: -1 to 0  Fentanyl drip   FAMILY  - Updates: No family at bedside.   - Inter-disciplinary family meet or Palliative Care meeting due by:  11/24  Phill Myron, D.O. 09/23/2016, 8:50 AM PGY-2, [place]      ATTENDING NOTE / ATTESTATION NOTE :   I have discussed the case with the resident/APP  Phill Myron.   I agree with the resident/APP's  history, physical examination, assessment, and plans.    I have edited the above note and modified it according to our agreed history, physical examination, assessment and plan.   I have spent 35 minutes of critical care time with this patient today.   Monica Becton, MD 09/23/2016, 8:50 AM [facility] Pager [phone] After 3 pm or if no answer, call [phone]

## 2016-09-23 NOTE — Progress Notes (Signed)
Patient with 20 beat run of Vtach. Juleen China, MD made aware. Will draw AM labs now per MD.

## 2016-09-23 NOTE — Progress Notes (Signed)
   [facility] PCCM  Pt remains critically ill.  Responded initially to IVF bolus (got 1.5L) but BP continues to drop.  On maximum levophed, neo, vasopressin.  Will give albumin as part of fluid resuscitation.  Labs sent this am > no results yet. On CVVH. Sedated.   I extensively discussed with daughter (spokesperson), ex-husband, sons, and rest of family re: over all poor prognosis.  We are treating pt as shock with meds and fluid resuucitation.  Possible septic emboli to abdomen. Possible acute abdomen. I do not think pt is in florid pulm edema 2/2 severe MR, AI related to I.E.  CXR only with minimal edema.   Plan : 1. Family wants to keep pt full code. reasses GOC in next 24 hrs.  2. Cont stabilizing pt.  She is too unstable to leave ICU for ct scan of abd.  When BP is better, we can plan on doing ct scan of abdomen.  3. Abx have been broadened.  4. Send labs now.  5. Cont pressors. Will give albumin as well. Will add epi drip.   Monica Becton, MD 09/23/2016, 1:48 PM [facility] Pager [phone] After 3 pm or if no answer, call [phone]

## 2016-09-23 NOTE — Progress Notes (Signed)
CRITICAL VALUE ALERT  Critical value received:  PH= 7.247, pCO2= 34, HCO3= 14.9  Date of notification:  09/23/16  Time of notification:  [id number]  Critical value read back:Yes.    Nurse who received alert:  Allegra Grana, RN  MD notified (1st page):  Warren Lacy MD  Time of first page:  1814  MD notified (2nd page):  Time of second page:  Responding MD:  Warren Lacy MD  Time MD responded:  [phone]

## 2016-09-24 ENCOUNTER — Inpatient Hospital Stay (HOSPITAL_COMMUNITY): Payer: Managed Care, Other (non HMO)

## 2016-09-24 DIAGNOSIS — J9601 Acute respiratory failure with hypoxia: Secondary | ICD-10-CM

## 2016-09-24 DIAGNOSIS — A419 Sepsis, unspecified organism: Secondary | ICD-10-CM

## 2016-09-24 LAB — CBC
HCT: 24.2 % — ABNORMAL LOW (ref 36.0–46.0)
HEMATOCRIT: 27.4 % — AB (ref 36.0–46.0)
Hemoglobin: 8.6 g/dL — ABNORMAL LOW (ref 12.0–15.0)
Hemoglobin: 7.7 g/dL — ABNORMAL LOW (ref 12.0–15.0)
MCH: 28.9 pg (ref 26.0–34.0)
MCH: 29.7 pg (ref 26.0–34.0)
MCHC: 31.4 g/dL (ref 30.0–36.0)
MCHC: 31.8 g/dL (ref 30.0–36.0)
MCV: 93.4 fL (ref 78.0–100.0)
MCV: 91.9 fL (ref 78.0–100.0)
PLATELETS: 51 10*3/uL — AB (ref 150–400)
Platelets: 49 10*3/uL — ABNORMAL LOW (ref 150–400)
RBC: 2.98 MIL/uL — ABNORMAL LOW (ref 3.87–5.11)
RBC: 2.59 MIL/uL — ABNORMAL LOW (ref 3.87–5.11)
RDW: 16.9 % — AB (ref 11.5–15.5)
RDW: 18.4 % — AB (ref 11.5–15.5)
WBC: 20.2 10*3/uL — ABNORMAL HIGH (ref 4.0–10.5)
WBC: 19.7 10*3/uL — ABNORMAL HIGH (ref 4.0–10.5)

## 2016-09-24 LAB — PREPARE FRESH FROZEN PLASMA
UNIT DIVISION: 0
UNIT DIVISION: 0
UNIT DIVISION: 0
Unit division: 0
Unit division: 0

## 2016-09-24 LAB — POCT I-STAT 3, ART BLOOD GAS (G3+)
ACID-BASE DEFICIT: 16 mmol/L — AB (ref 0.0–2.0)
Acid-base deficit: 9 mmol/L — ABNORMAL HIGH (ref 0.0–2.0)
Bicarbonate: 10.4 mmol/L — ABNORMAL LOW (ref 20.0–28.0)
Bicarbonate: 15.7 mmol/L — ABNORMAL LOW (ref 20.0–28.0)
O2 SAT: 100 %
O2 SAT: 92 %
PCO2 ART: 28.7 mmHg — AB (ref 32.0–48.0)
PH ART: 7.339 — AB (ref 7.350–7.450)
PO2 ART: 251 mmHg — AB (ref 83.0–108.0)
Patient temperature: 95.8
Patient temperature: 96
TCO2: 11 mmol/L (ref 0–100)
TCO2: 17 mmol/L (ref 0–100)
pCO2 arterial: 22.7 mmHg — ABNORMAL LOW (ref 32.0–48.0)
pH, Arterial: 7.261 — ABNORMAL LOW (ref 7.350–7.450)
pO2, Arterial: 61 mmHg — ABNORMAL LOW (ref 83.0–108.0)

## 2016-09-24 LAB — RENAL FUNCTION PANEL
Albumin: 4.5 g/dL (ref 3.5–5.0)
Anion gap: 21 — ABNORMAL HIGH (ref 5–15)
CHLORIDE: 94 mmol/L — AB (ref 101–111)
CO2: 17 mmol/L — ABNORMAL LOW (ref 22–32)
CREATININE: 1.08 mg/dL — AB (ref 0.44–1.00)
Calcium: 8.5 mg/dL — ABNORMAL LOW (ref 8.9–10.3)
GFR calc Af Amer: >60 mL/min (ref >60)
GFR, EST NON AFRICAN AMERICAN: 57 mL/min — AB (ref >60)
Glucose, Bld: 195 mg/dL — ABNORMAL HIGH (ref 65–99)
Phosphorus: 5 mg/dL — ABNORMAL HIGH (ref 2.5–4.6)
Potassium: 4.8 mmol/L (ref 3.5–5.1)
SODIUM: 132 mmol/L — AB (ref 135–145)

## 2016-09-24 LAB — GLUCOSE, CAPILLARY
GLUCOSE-CAPILLARY: 179 mg/dL — AB (ref 65–99)
GLUCOSE-CAPILLARY: 195 mg/dL — AB (ref 65–99)
GLUCOSE-CAPILLARY: 213 mg/dL — AB (ref 65–99)

## 2016-09-24 LAB — CULTURE, BLOOD (ROUTINE X 2)
CULTURE: NO GROWTH
CULTURE: NO GROWTH

## 2016-09-24 LAB — PHOSPHORUS: PHOSPHORUS: 5 mg/dL — AB (ref 2.5–4.6)

## 2016-09-24 LAB — HEMOGLOBIN AND HEMATOCRIT, BLOOD
HCT: 24.2 % — ABNORMAL LOW (ref 36.0–46.0)
HEMATOCRIT: 24 % — AB (ref 36.0–46.0)
HEMOGLOBIN: 7.7 g/dL — AB (ref 12.0–15.0)
Hemoglobin: 7.5 g/dL — ABNORMAL LOW (ref 12.0–15.0)

## 2016-09-24 LAB — CREATININE, SERUM: Creatinine, Ser: 1.03 mg/dL — ABNORMAL HIGH (ref 0.44–1.00)

## 2016-09-24 LAB — MAGNESIUM: Magnesium: 2.7 mg/dL — ABNORMAL HIGH (ref 1.7–2.4)

## 2016-09-24 LAB — VANCOMYCIN, TROUGH: Vancomycin Tr: 10 ug/mL — ABNORMAL LOW (ref 15–20)

## 2016-09-24 MED ORDER — VANCOMYCIN HCL IN DEXTROSE 1-5 GM/200ML-% IV SOLN
1000.0000 mg | INTRAVENOUS | Status: DC
  Administered 2016-09-24: 1000 mg via INTRAVENOUS
  Filled 2016-09-24: qty 200

## 2016-09-25 LAB — TYPE AND SCREEN
ABO/RH(D): O POS
ANTIBODY SCREEN: NEGATIVE
UNIT DIVISION: 0
UNIT DIVISION: 0
UNIT DIVISION: 0
Unit division: 0

## 2016-09-25 LAB — CULTURE, RESPIRATORY: CULTURE: NORMAL

## 2016-09-25 LAB — CULTURE, RESPIRATORY W GRAM STAIN

## 2016-09-28 LAB — CULTURE, BLOOD (ROUTINE X 2)
CULTURE: NO GROWTH
Culture: NO GROWTH

## 2016-09-29 ENCOUNTER — Telehealth: Payer: Self-pay

## 2016-09-29 LAB — GLUCOSE, CAPILLARY: GLUCOSE-CAPILLARY: 11 mg/dL — AB (ref 65–99)

## 2016-09-29 NOTE — Telephone Encounter (Signed)
On 09/30/2016 I received a death certificate from [facility]. The death certificate is for burial. The patient is a patient of [facility].  The death certificate will be taken to Pulmonary Unit this am for signature.  On Oct 17, 2016 I received the death certificate back from [place]. I got the death certificate ready and called the funeral home to let them know the death certificate is ready for pickup.

## 2016-09-30 LAB — FUNGUS CULTURE, BLOOD: CULTURE: NO GROWTH

## 2016-10-02 ENCOUNTER — Ambulatory Visit: Payer: Managed Care, Other (non HMO) | Admitting: Cardiovascular Disease

## 2016-10-03 NOTE — Progress Notes (Signed)
Notified by Sharyn Lull, RN at 10:40 that patient had passed. Pronounced dead by two RNs at 10:30. Family aware and at bedside. Will continue to offer support.   Phill Myron, D.O. 10-22-16, 10:43 AM PGY-2, [place]

## 2016-10-03 NOTE — Progress Notes (Signed)
Time of death 1038. On monitor pt had no heart beat. No heart sounds auscultated. No pulse noted. Pupils fixed and non reactive. This was verified with a second Therapist, sports.  Pts family at bedside and aware of pt passing. Support given. Will continue to offer support. Comfort cart at bedside.  [facility] called. Referral number [id number]. Pt is NOT suitable for organ, tissues or eye donation.

## 2016-10-03 NOTE — Progress Notes (Signed)
200cc of Fentanyl wasted in sink with Balinda Quails, RN.

## 2016-10-03 NOTE — Discharge Summary (Signed)
PULMONARY / CRITICAL CARE MEDICINE   Name: MONTANNA HELLWIG MRN: [id number] DOB: [birth year]    ADMISSION DATE:  09/28/2016 CONSULTATION DATE:  11/16  REFERRING MD:  Dr. 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 She presented to [facility] ED 11/15 from HD with complaints of SOB and subjective feeling of face/neck swelling. She was evaluated in the ED with concern for PNA and was admitted to the hospitalists and treated with IV antibiotics.   She had some acute on chronic anemia 11/16 prompting the administration of 2 units PRBC on 11/16, after which, she developed worsening SOB ultimately resulting in respiratory arrest requiring emergent intubation. PCCM to assume care.  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  PAST SURGICAL HISTORY: She  has a past surgical history that includes Breast biopsy; Tubal ligation; Insertion of dialysis catheter (Right, 02/05/2015); Bascilic vein transposition (Left, 02/14/2015); Colonoscopy w/ biopsies and polypectomy; Bascilic vein transposition (Left, 03/28/2015); Revison of arteriovenous fistula (Left, 09/26/2015); Insertion of dialysis catheter (N/A, 10/05/2015); Cardiac catheterization (N/A,  10/10/2015); Ligation of arteriovenous  fistula (Left, 12/21/2015); AV fistula placement (Right, 12/21/2015); Insertion of dialysis catheter (Left, 12/21/2015); Cardiac catheterization (N/A, 03/18/2016); Cardiac catheterization (Left, 03/18/2016); Bascilic vein transposition (Right, 04/17/2016); and ir generic historical (Right, 06/12/2016).  Allergies  Allergen Reactions  . Sulfa Antibiotics Itching    No current facility-administered medications on file prior to encounter.    Current Outpatient Prescriptions on File Prior to Encounter  Medication Sig  . ALPRAZolam (XANAX) 0.25 MG tablet Take 1 tablet (0.25 mg total) by mouth every 8 (eight) hours as needed for anxiety (0.25mg -0.5mg ). (Patient taking differently: Take 0.25-0.5 mg by mouth every 8 (eight) hours as needed for anxiety. )  . cinacalcet (SENSIPAR) 30 MG tablet Take 30 mg by mouth daily.  Marland Kitchen LYRICA 75 MG capsule take 1 capsule by mouth twice a day  . multivitamin (RENA-VIT) TABS tablet Take 1 tablet by mouth daily.  . nortriptyline (PAMELOR) 10 MG capsule Take one capsule at night for one week, then take 2 capsules at night for one week, then take 3 capsules at night (Patient taking differently: Take 10 mg by mouth at bedtime. Take one capsule at night for one week, then take 2 capsules at night for one week, then take 3 capsules at night)  . oxyCODONE-acetaminophen (PERCOCET/ROXICET) 5-325 MG tablet Take 1 tablet by mouth every 6 (six) hours as needed.  . pantoprazole (PROTONIX) 40 MG tablet Take 1 tablet (40 mg total) by mouth daily.  . pravastatin (PRAVACHOL) 20 MG tablet Take 20 mg by mouth daily.  Marland Kitchen RENVELA 800 MG tablet Take 2,400-4,800 mg by mouth See admin instructions. Takes six tablets three times daily with each meal and three tablets with snacks.  . temazepam (RESTORIL) 30 MG capsule Take 30 mg by mouth at bedtime as needed for sleep.   Marland Kitchen thiamine 100 MG tablet  Take 1 tablet (100 mg total) by mouth daily.    FAMILY HISTORY:     SOCIAL HISTORY: She  reports that she quit smoking about 2 years ago. Her smoking use included Cigarettes. She has a 15.00 pack-year smoking history. She has never used smokeless tobacco. She reports that she does not drink alcohol or use drugs.  REVIEW OF SYSTEMS:   Unable  SUBJECTIVE:    VITAL SIGNS: BP (!) 59/52   Pulse (!) 103   Temp (!) 95.8 F (35.4 C) (Axillary)   Resp 20   Ht 5\' 5"  (1.651 m)   Wt 72.8 kg (160 lb 7.9 oz)   LMP 07/18/2012   SpO2 100%   BMI 26.71 kg/m   HEMODYNAMICS:    VENTILATOR SETTINGS:    INTAKE / OUTPUT: No intake/output data recorded.  PHYSICAL EXAMINATION: General:  Obese female in respiratory distess Neuro:  Initially alert and answering questions and quickly became unresponsive and apneic HEENT:  [place]/AT, JVD, PERRL Cardiovascular:  Tachy, regular, improving with sedation Lungs:  Coarse crackles Abdomen:  Soft, non-distended Musculoskeletal:  No acute deformity. RUE AV fistula Skin: Grossly  LABS:  BMET  Recent Labs Lab 09/23/16 0200 09/23/16 1453 2016/10/21 0350  NA 138 141 132*  K 5.7* 4.2 4.8  CL 104 105 94*  CO2 18* 18* 17*  BUN 15 8 <5*  CREATININE 1.32* 1.15* 1.08*  1.03*  GLUCOSE 182* 135* 195*    Electrolytes  Recent Labs Lab 09/23/16 0200 09/23/16 1453 10-21-2016 0350  CALCIUM 9.3 7.8* 8.5*  MG 3.0*  --  2.7*  PHOS 4.8*  --  5.0*  5.0*    CBC  Recent Labs Lab 09/23/16 1823  10/21/16 0000 Oct 21, 2016 0350 10-21-2016 0558 2016/10/21 0900  WBC 25.0*  --  20.2*  --  19.7*  --   HGB 7.1*  < > 8.6* 7.7* 7.7* 7.5*  HCT 22.8*  < > 27.4* 24.2* 24.2* 24.0*  PLT 54*  --  49*  --  51*  --   < > = values in this interval not displayed.  Coag's  Recent Labs Lab 09/23/16 1431 09/23/16 2235  APTT 65*  --   INR 3.54 2.81    Sepsis Markers  Recent Labs Lab 09/23/16 0518 09/23/16 0818  LATICACIDVEN 11.9* 11.0*    ABG  Recent Labs Lab 09/23/16 1349 10-21-16 0047 10-21-16 0917  PHART 7.316*  7.339* 7.261*  PCO2ART 27.1* 28.7* 22.7*  PO2ART 108.0 61.0* 251.0*    Liver Enzymes  Recent Labs Lab 09/23/16 0200 09/23/16 1453 10-21-16 0350  AST  --  4,910*  --   ALT  --  2,584*  --   ALKPHOS  --  141*  --   BILITOT  --  1.3*  --   ALBUMIN 2.9* 2.0* 4.5    Cardiac Enzymes No results for input(s): TROPONINI, PROBNP in the last 168 hours.  Glucose  Recent Labs Lab 09/23/16 1157 09/23/16 1346 09/23/16 2002 2016/10/21 0003 Oct 21, 2016 0415 Oct 21, 2016 0722  GLUCAP 81 114* 199* 179* 195* 213*    Imaging No results found.   STUDIES:    CULTURES: Blood >>> Urine >>>  ANTIBIOTICS: Vanc 11/15 > 11/16 Zosyn 11/15 > 11/16  SIGNIFICANT EVENTS: 11/15 admit 11/16 intubated for respiratory distress  LINES/TUBES: ETT 11/16 >  DISCUSSION: 55 year old female with ESRD on HD admitted for PNA 11/15, however, CXR at that time, given current changes, more likely represented volume overload. She likely had incomplete HD 11/15 and  got volume in the way of IVF, ABX, and blood products since being admitted. I suspect she suffered flash pulmonary edema and will require urgent HD tonight.   ASSESSMENT / PLAN:  PULMONARY A: Acute hypoxemic respiratory failure secondary to pulmonary edema ?TRALI  P:   Full vent support CXR ABG VAP bundle HD for volume removal  CARDIOVASCULAR A:  Tachycardia post intubation H/o chronic hypotension, MI  P:  Telemetry monitoring If HR doesn't improve with sedation, HD will consider BB Trend troponin  RENAL A:   ESRD on HD  P:   Urgent HD per nephrology  GASTROINTESTINAL A:   ? GIB  P:   NPO for now Occult blood testing  HEMATOLOGIC A:   Anemia (baseline Hgb 9-10)  P:  S/p 2 units PRBC Repeat Hgb Transfuse for Hgb < 7  INFECTIOUS A:   ? PNA - doubt  P:   DC abx Follow WBC and fevers  ENDOCRINE A:   DM  P:   CBG monitoring and SSI  NEUROLOGIC A:   Acute encephalopathy  P:   RASS goal:  -1 Fentanyl gtt   FAMILY  - Updates: family updated by [facility]  - Inter-disciplinary family meet or Palliative Care meeting due by:  11/24  APP CC time 30 mins  Georgann Housekeeper, AGACNP-BC [place] [facility] Pager [phone] or [phone]  09/29/2016 4:17 PM  Addendum 11/27 :  Pt remained critically ill. Pls see last ICU progress note :   PULMONARY / CRITICAL CARE MEDICINE   Name: ALIDA BLECHA MRN: [id number] DOB: [birth year]    ADMISSION DATE:  09/21/2016 CONSULTATION DATE:  11/16  REFERRING MD:  Dr. 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 She presented to [facility] ED 11/15 from HD with complaints of SOB and subjective feeling of face/neck swelling. She was evaluated in the ED with concern for PNA and was admitted to the hospitalists and treated with IV antibiotics.   She had some acute on chronic anemia 11/16 prompting the administration of 2 units PRBC on 11/16, after which, she developed worsening SOB ultimately resulting in respiratory arrest requiring emergent intubation. PCCM to assume care.  SIGNIFICANT EVENTS: 11/15 admit 11/16 intubated for respiratory distress 11/17: Initial blood cx with MRSE in 2/2.  11/19: TTE with density on MV leaflet concerning for vegetation.  11/20: TEE with severe AI with vegetation, mild to moderate MR with large vegetation. Requiring support of 4 pressors.    SUBJECTIVE:  Pt remained critically ill overnight.  Remains on 4 pressors. Max dose pressors. On epi at 20 mcg/min. Received 4 u pRBC and 4 u FFP. Hb has been stable.  Looks  Moribund. Abdomen less distended. Mottled.  MAP in the 50s since last night. On cvvh.   VITAL SIGNS: BP (!) 59/52   Pulse (!) 103   Temp (!) 95.8 F (35.4 C) (Axillary)   Resp 20   Ht 5\' 5"  (1.651 m)   Wt 72.8 kg (160 lb 7.9 oz)   LMP 07/18/2012   SpO2 100%   BMI 26.71  kg/m   HEMODYNAMICS:    VENTILATOR SETTINGS:    INTAKE / OUTPUT: No intake/output data recorded.  PHYSICAL EXAMINATION: General:  Obese female lying in bed. Moribund.  Sedated. No purposeful movements Neuro:  CN grossly intact.  Not following commands.  HEENT:  ETT in place, PERRL Cardiovascular:  RRR s1 s2 . (-) audible m. (-) rub/gallop.  Lungs:  Good ae. Some bibasilar  crackles. (-) rhonchi/wheeze.  Abdomen:  Distended abdomen, soft. (-) tenderness. Very diminished BS.  Musculoskeletal:  No acute deformity. RUE AV fistula. Warm ext (with bear hugger). (+) mottling   LABS:  BMET  Recent Labs Lab 09/23/16 0200 09/23/16 1453 09-29-2016 0350  NA 138 141 132*  K 5.7* 4.2 4.8  CL 104 105 94*  CO2 18* 18* 17*  BUN 15 8 <5*  CREATININE 1.32* 1.15* 1.08*  1.03*  GLUCOSE 182* 135* 195*    Electrolytes  Recent Labs Lab 09/23/16 0200 09/23/16 1453 2016-09-29 0350  CALCIUM 9.3 7.8* 8.5*  MG 3.0*  --  2.7*  PHOS 4.8*  --  5.0*  5.0*    CBC  Recent Labs Lab 09/23/16 1823  09-29-16 0000 09-29-2016 0350 September 29, 2016 0558 2016-09-29 0900  WBC 25.0*  --  20.2*  --  19.7*  --   HGB 7.1*  < > 8.6* 7.7* 7.7* 7.5*  HCT 22.8*  < > 27.4* 24.2* 24.2* 24.0*  PLT 54*  --  49*  --  51*  --   < > = values in this interval not displayed.  Coag's  Recent Labs Lab 09/23/16 1431 09/23/16 2235  APTT 65*  --   INR 3.54 2.81    Sepsis Markers  Recent Labs Lab 09/23/16 0518 09/23/16 0818  LATICACIDVEN 11.9* 11.0*    ABG  Recent Labs Lab 09/23/16 1349 09/29/16 0047 09/29/16 0917  PHART 7.316* 7.339* 7.261*  PCO2ART 27.1* 28.7* 22.7*  PO2ART 108.0 61.0* 251.0*    Liver Enzymes  Recent Labs Lab 09/23/16 0200 09/23/16 1453 09-29-2016 0350  AST  --  4,910*  --   ALT  --  2,584*  --   ALKPHOS  --  141*  --   BILITOT  --  1.3*  --   ALBUMIN 2.9* 2.0* 4.5    Cardiac Enzymes No results for input(s): TROPONINI, PROBNP in the last 168  hours.  Glucose  Recent Labs Lab 09/23/16 1157 09/23/16 1346 09/23/16 2002 Sep 29, 2016 0003 29-Sep-2016 0415 09-29-16 0722  GLUCAP 81 114* 199* 179* 195* 213*    Imaging No results found.   STUDIES:  CT head 11/15 neg for acute  Echo 11/19>>>LVEF 55-60%. G1DD. MV with severe regurgitation. Large oscillating density on anterior MV leaflet concerning for vegetation.  TEE 11/20 >> Severe AI with vegetation, mild to moderate MR with large vegetation. EF 65-70%.   CULTURES: Blood 11/15 >>>GPC in clusters, >Staph species (co ag neg )  Blood 11/17>>>NGTD  Blood 11/21 >>>pending Fungal Blood 11/21 >> pending  Trach Culture 11/21 >>> pending    ANTIBIOTICS: Vanc 11/15 >>> Zosyn 11/15 > 11/16, 11/21 >>  Eraxis 11/21 >>   LINES/TUBES: ETT 11/16 >>> 11/16 rt IJ>>>  DISCUSSION: 55 year old female with ESRD on HD admitted for PNA 11/15, however, CXR at that time, given current changes, more likely represented volume overload. She likely had incomplete HD 11/15 and got volume in the way of IVF, ABX, and blood products since being admitted. I suspect she suffered flash pulmonary edema , cxr is improved after CRRT   ASSESSMENT / PLAN:  PULMONARY A: Acute hypoxemic respiratory failure secondary to pulmonary edema, severe sepsis, infective endocarditis,  lactic acidosis Less likely HCAP given CXR findings.   P:   Full vent support No weaning. Pt is in shock.  CXR VAP bundle Solu-Cortef 50 mg q6h  CRRT  Cont Abx   CARDIOVASCULAR A:  Shock, likely, septic and hemorrhagic, on max pressors, with shock liver.  Infective Endocarditis. 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 EF65-70% H/o chronic hypotension, MI P:  Telemetry monitoring Levophed and Vasopressin and Neosynephrine and Epinephrine for shock. Cont abx.  [facility] (Dr. Cyndia Bent) aware of pt's valvular issues.  Too high risk for cx at this point given severe sepsis/septic shock.      RENAL A:   ESRD on HD Lactic Acidosis  Anion Gap Metabolic Acidosis  P:   Cont CVVH > keep even Sodium Bicarb at 75 cc/hr    GASTROINTESTINAL A:   Consider ischemic bowel, possible GI bleed.  Shock liver.  P:   NPO for now pepcid IV  Holding TFs  NGT to suction Will need abd/pelvis ct scan once more stable. Too sick to go down.    HEMATOLOGIC A:   Concern for GI bleed (abdomen) given shock/anemia  Anemia (baseline Hgb 9-10), pt s/p 4uPRBC on 11/21  H/o Lupus, r/o hemolysis, hapt neg, ldh neg Hemorrhagic Shock  Thrombocytopenia Elevated INR, improved from 3.54 >> 2.81   P:  Monitor HgB  Transfuse for Hgb < 7 Assess hemolysis - neg thus far. DIC panel U/R S/p 4uFFP, 4u PRBC and Vitamin K   INFECTIOUS A:   MRSE Bacteremia NO hardware in place chronic New abdominal pain concerning for process such as septic emboli or acute abd Leukocytosis P:   Vancomycin (11/15 >>), Zosyn (11/21 >>), Eraxis (11/21 >>)  Cultures pending  ID following, appreciate recs   ENDOCRINE A:   DM P:   CBG monitoring and SSI  NEUROLOGIC A:   Acute encephalopathy, resolved  P:   RASS goal: -1 to 0  Fentanyl drip   FAMILY  - Updates: No family at bedside.   - Inter-disciplinary family meet or Palliative Care meeting due by:  11/24  Phill Myron, D.O. 09/29/2016, 4:18 PM PGY-2, [facility]    ATTENDING NOTE / ATTESTATION NOTE :   I have discussed the case with the resident/APP  Phill Myron DO  I agree with the resident/APP's  history, physical examination, assessment, and plans.    I have edited the above note and modified it according to our agreed history, physical examination, assessment and plan. Note as above.    I have spent 35  minutes of critical care time with this patient today.  Family :Family updated at length today. Family extensively updated at bedside. Discussed over all poor prognosis. Pt likely will not make it. She  remains full code.   Addendum : I extensively d/w family (children, ex-husband, siblings). They have made pt DNR.   Monica Becton, MD 09/29/2016, 4:18 PM [place] [facility] Pager [phone] After 3 pm or if no answer, call [phone]    Addendum 11/27 : Family made DNR. She was pronounced at 10:40am on 2016/10/17. Family at bedside.   Monica Becton, MD 09/29/2016, 4:19 PM [place] [facility] Pager [phone] After 3 pm or if no answer, call [phone]

## 2016-10-03 NOTE — Progress Notes (Addendum)
PULMONARY / CRITICAL CARE MEDICINE   Name: [name] [name] MRN: [id number] DOB: July 10, 1961    ADMISSION DATE:  09/20/2016 CONSULTATION DATE:  11/16  REFERRING MD:  Dr. 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 She presented to [facility] ED 11/15 from HD with complaints of SOB and subjective feeling of face/neck swelling. She was evaluated in the ED with concern for PNA and was admitted to the hospitalists and treated with IV antibiotics.   She had some acute on chronic anemia 11/16 prompting the administration of 2 units PRBC on 11/16, after which, she developed worsening SOB ultimately resulting in respiratory arrest requiring emergent intubation. PCCM to assume care.  SIGNIFICANT EVENTS: 11/15 admit 11/16 intubated for respiratory distress 11/17: Initial blood cx with MRSE in 2/2.  11/19: TTE with density on MV leaflet concerning for vegetation.  11/20: TEE with severe AI with vegetation, mild to moderate MR with large vegetation. Requiring support of 4 pressors.    SUBJECTIVE:  Pt remained critically ill overnight.  Remains on 4 pressors. Max dose pressors. On epi at 20 mcg/min. Received 4 u pRBC and 4 u FFP. Hb has been stable.  Looks  Moribund. Abdomen less distended. Mottled.  MAP in the 50s since last night. On cvvh.   VITAL SIGNS: BP (!) 35/16   Pulse (!) 115   Temp (!) 95.8 F (35.4 C) (Axillary)   Resp 20   Ht 5\' 5"  (1.651 m)   Wt 72.8 kg (160 lb 7.9 oz)   LMP 07/18/2012   SpO2 100%   BMI 26.71 kg/m   HEMODYNAMICS:    VENTILATOR SETTINGS: Vent Mode: PRVC FiO2 (%):  [30 %] 30 % Set Rate:  [20 bmp] 20 bmp Vt Set:  [460 mL] 460 mL PEEP:  [5 cmH20] 5 cmH20 Plateau Pressure:  [17 cmH20-20 cmH20] 19 cmH20  INTAKE / OUTPUT: I/O last 3 completed shifts: In: [id number] [I.V.:7280; Blood:1605; Other:200; NG/GT:630; IV Piggyback:3502] Out: [id number] [Emesis/NG  output:1200; Other:6570]  PHYSICAL EXAMINATION: General:  Obese female lying in bed. Moribund.  Sedated. No purposeful movements Neuro:  CN grossly intact.  Not following commands.  HEENT:  ETT in place, PERRL Cardiovascular:  RRR s1 s2 . (-) audible m. (-) rub/gallop.  Lungs:  Good ae. Some bibasilar  crackles. (-) rhonchi/wheeze.  Abdomen:  Distended abdomen, soft. (-) tenderness. Very diminished BS.  Musculoskeletal:  No acute deformity. RUE AV fistula. Warm ext (with bear hugger). (+) mottling   LABS:  BMET  Recent Labs Lab 09/23/16 0200 09/23/16 1453 09/30/2016 0350  NA 138 141 132*  K 5.7* 4.2 4.8  CL 104 105 94*  CO2 18* 18* 17*  BUN 15 8 <5*  CREATININE 1.32* 1.15* 1.08*  1.03*  GLUCOSE 182* 135* 195*    Electrolytes  Recent Labs Lab 09/22/16 0400 09/22/16 1524 09/23/16 0200 09/23/16 1453 2016/09/30 0350  CALCIUM 9.8 9.6 9.3 7.8* 8.5*  MG 2.8*  --  3.0*  --  2.7*  PHOS 2.8 2.9 4.8*  --  5.0*  5.0*    CBC  Recent Labs Lab 09/23/16 1823  09/30/16 0000 September 30, 2016 0350 2016-09-30 0558  WBC 25.0*  --  20.2*  --  19.7*  HGB 7.1*  < > 8.6* 7.7* 7.7*  HCT 22.8*  < > 27.4* 24.2* 24.2*  PLT 54*  --  49*  --  PENDING  < > =  values in this interval not displayed.  Coag's  Recent Labs Lab 09/18/16 0336 09/23/16 1431 09/23/16 2235  APTT 29 65*  --   INR 1.01 3.54 2.81    Sepsis Markers  Recent Labs Lab 09/18/16 0336 09/18/16 0515 09/23/16 0518 09/23/16 0818  LATICACIDVEN 0.9 0.6 11.9* 11.0*  PROCALCITON 2.62  --   --   --     ABG  Recent Labs Lab 09/23/16 0744 09/23/16 1349 09/29/16 0047  PHART 7.398 7.316* 7.339*  PCO2ART 31.0* 27.1* 28.7*  PO2ART 113.0* 108.0 61.0*    Liver Enzymes  Recent Labs Lab 09/15/2016 1945  09/19/16 1027  09/23/16 0200 09/23/16 1453 29-Sep-2016 0350  AST 20  --   --   --   --  4,910*  --   ALT 9*  --   --   --   --  2,584*  --   ALKPHOS 55  --   --   --   --  141*  --   BILITOT 0.5  --  0.7  --   --   1.3*  --   ALBUMIN 2.4*  < >  --   < > 2.9* 2.0* 4.5  < > = values in this interval not displayed.  Cardiac Enzymes  Recent Labs Lab 09/19/16 1027 09/19/16 1627 09/19/16 2137  TROPONINI 0.32* 0.25* 0.20*    Glucose  Recent Labs Lab 09/23/16 1157 09/23/16 1346 09/23/16 2002 September 29, 2016 0003 09/29/16 0415 September 29, 2016 0722  GLUCAP 81 114* 199* 179* 195* 213*    Imaging Dg Abd Portable 1v  Result Date: 09/23/2016 CLINICAL DATA:  Abdominal pain EXAM: PORTABLE ABDOMEN - 1 VIEW COMPARISON:  None. FINDINGS: Scattered large and small bowel gas is noted. Fecal material is noted within the colon although no obstructive changes are seen. Nasogastric catheter is noted within the stomach. Right femoral line is noted with the tip at the junction of the iliac veins. No definitive free air is seen. Diffuse aortoiliac calcifications are noted. No acute bony abnormality is noted. IMPRESSION: Tubes and lines as described. No acute abnormality seen. Electronically Signed   By: Inez Catalina M.D.   On: 09/23/2016 08:18     STUDIES:  CT head 11/15 neg for acute  Echo 11/19>>>LVEF 55-60%. G1DD. MV with severe regurgitation. Large oscillating density on anterior MV leaflet concerning for vegetation.  TEE 11/20 >> Severe AI with vegetation, mild to moderate MR with large vegetation. EF 65-70%.   CULTURES: Blood 11/15 >>>GPC in clusters, >Staph species (co ag neg )  Blood 11/17>>>NGTD  Blood 11/21 >>>pending Fungal Blood 11/21 >> pending  Trach Culture 11/21 >>> pending    ANTIBIOTICS: Vanc 11/15 >>> Zosyn 11/15 > 11/16, 11/21 >>  Eraxis 11/21 >>   LINES/TUBES: ETT 11/16 >>> 11/16 rt IJ>>>  DISCUSSION: 55 year old female with ESRD on HD admitted for PNA 11/15, however, CXR at that time, given current changes, more likely represented volume overload. She likely had incomplete HD 11/15 and got volume in the way of IVF, ABX, and blood products since being admitted. I suspect she suffered flash  pulmonary edema , cxr is improved after CRRT   ASSESSMENT / PLAN:  PULMONARY A: Acute hypoxemic respiratory failure secondary to pulmonary edema, severe sepsis, infective endocarditis,  lactic acidosis Less likely HCAP given CXR findings.   P:   Full vent support No weaning. Pt is in shock.  CXR VAP bundle Solu-Cortef 50 mg q6h  CRRT  Cont Abx   CARDIOVASCULAR A:  Shock, likely, septic and hemorrhagic, on max pressors, with shock liver.  Infective Endocarditis. 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 EF65-70% H/o chronic hypotension, MI P:  Telemetry monitoring Levophed and Vasopressin and Neosynephrine and Epinephrine for shock. Cont abx.  [facility] (Dr. Cyndia Bent) aware of pt's valvular issues.  Too high risk for cx at this point given severe sepsis/septic shock.     RENAL A:   ESRD on HD Lactic Acidosis  Anion Gap Metabolic Acidosis  P:   Cont CVVH > keep even Sodium Bicarb at 75 cc/hr    GASTROINTESTINAL A:   Consider ischemic bowel, possible GI bleed.  Shock liver.  P:   NPO for now pepcid IV  Holding TFs  NGT to suction Will need abd/pelvis ct scan once more stable. Too sick to go down.    HEMATOLOGIC A:   Concern for GI bleed (abdomen) given shock/anemia  Anemia (baseline Hgb 9-10), pt s/p 4uPRBC on 11/21  H/o Lupus, r/o hemolysis, hapt neg, ldh neg Hemorrhagic Shock  Thrombocytopenia Elevated INR, improved from 3.54 >> 2.81   P:  Monitor HgB  Transfuse for Hgb < 7 Assess hemolysis - neg thus far. DIC panel U/R S/p 4uFFP, 4u PRBC and Vitamin K   INFECTIOUS A:   MRSE Bacteremia NO hardware in place chronic New abdominal pain concerning for process such as septic emboli or acute abd Leukocytosis P:   Vancomycin (11/15 >>), Zosyn (11/21 >>), Eraxis (11/21 >>)  Cultures pending  ID following, appreciate recs   ENDOCRINE A:   DM P:   CBG monitoring and SSI  NEUROLOGIC A:   Acute encephalopathy,  resolved  P:   RASS goal: -1 to 0  Fentanyl drip   FAMILY  - Updates: No family at bedside.   - Inter-disciplinary family meet or Palliative Care meeting due by:  11/24  Phill Myron, D.O. 2016-10-23, 8:05 AM PGY-2, [place]    ATTENDING NOTE / ATTESTATION NOTE :   I have discussed the case with the resident/APP  Phill Myron DO  I agree with the resident/APP's  history, physical examination, assessment, and plans.    I have edited the above note and modified it according to our agreed history, physical examination, assessment and plan. Note as above.    I have spent 35  minutes of critical care time with this patient today.  Family :Family updated at length today. Family extensively updated at bedside. Discussed over all poor prognosis. Pt likely will not make it. She remains full code.   Addendum : I extensively d/w family (children, ex-husband, siblings). They have made pt DNR.   Monica Becton, MD 10/23/2016, 8:05 AM [facility] Pager [phone] After 3 pm or if no answer, call [phone]

## 2016-10-03 NOTE — Progress Notes (Signed)
   2016-10-14 1015  Clinical Encounter Type  Visited With Patient and family together  Visit Type Patient actively dying  Referral From Nurse  Consult/Referral To Chaplain  Spiritual Encounters  Spiritual Needs Prayer;Grief support  Stress Factors  Patient Stress Factors Major life changes  Family Stress Factors Family relationships;Major life changes  Paged to floor. Introduction to family. Offered prayer of comfort for Pt and family.

## 2016-10-03 NOTE — Progress Notes (Signed)
eLink Physician-Brief Progress Note Patient Name: [name] [name] DOB: 1961-10-26 MRN: [id number]   Date of Service  10/17/2016  HPI/Events of Note  On maximum pressor support Blood Pressure worsening ABG and CBC not suggestive of bleeding or worsening acidosis as cause of shock  eICU Interventions  Informed sister that there was nothing more we could do and she would likely die.  I also let her know that CPR would not be medically beneficial.  She is going to update family     Intervention Category Major Interventions: Shock - evaluation and management  Simonne Maffucci Oct 17, 2016, 1:36 AM

## 2016-10-03 NOTE — Progress Notes (Signed)
Patient currently maxed on 4 pressors. McQuaid MD made are. McQuaid spoke with patient's sister and stated that there was nothing more we could do and he did not think compressions would be appropriate for this patient as it would be futile. Sister went to update family.

## 2016-10-03 NOTE — Progress Notes (Signed)
CVVHD management.  Metabolically stable.  BP remains low on 4 pressors.  Discussed with family and boyfriend the gravity of the situation.  Keeping even for now.  BC remain negative for now.

## 2016-10-03 NOTE — Progress Notes (Signed)
Pharmacy Antibiotic Note  [name] [name] is a 55 y.o. female admitted on 09/27/2016 with SOB concerning for HCAP and originally started on Vancomycin + Zosyn - then d/c'd.  BCID results later showed 2/2 BCx positive for possible MRSE. Pharmacy re-consulted for Zosyn and Vancomycin. Afebrile, WBC elevated at 19.7, and LA 11. Vancomycin trough subtherapeutic at 10 mcg/mL. Level was drawn two hours early, but will empirically increase dose to reach goal trough 15-20 mcg/mL.   The patient is ESRD- on HD MWF and was transitioned to CRRT on 11/17. Plan is to continue CRRT secondary to hemodynamic instability.   Plan: Start Zosyn 3.375g IV q6hr (30 min infusion) Start Eraxis 200mg  x1 then 100mg  q24h Increase vancomycin to 1g q24hr Vancomycin trough at SS and prn; goal trough 15-20 mcg/mL Will continue to follow CRRT/HD schedule/duration, culture results, LOT, and antibiotic de-escalation plans   Height: 5\' 5"  (165.1 cm) Weight: 160 lb 7.9 oz (72.8 kg) IBW/kg (Calculated) : 57  Temp (24hrs), Avg:95.8 F (35.4 C), Min:94.2 F (34.6 C), Max:97.5 F (36.4 C)   Recent Labs Lab 09/18/16 0008 09/18/16 0336 09/18/16 0515 09/19/16 0500  09/22/16 0400  09/22/16 1524  09/23/16 0200 09/23/16 0518 09/23/16 0818 09/23/16 1345 09/23/16 1453 09/23/16 1800 09/23/16 1823 September 26, 2016 0000 26-Sep-2016 0350 2016/09/26 0558 09/26/16 0723  WBC  --   --  4.4 7.2  < >  --   < >  --   < >  --   --   --  31.8*  --  25.6* 25.0* 20.2*  --  19.7*  --   CREATININE  --  5.12*  --  4.95*  4.84*  < > 1.38*  --  1.22*  --  1.32*  --   --   --  1.15*  --   --   --  1.08*  1.03*  --   --   LATICACIDVEN 0.55 0.9 0.6  --   --   --   --   --   --   --  11.9* 11.0*  --   --   --   --   --   --   --   --   VANCOTROUGH  --   --   --   --   --   --   --   --   --   --   --   --   --   --   --   --   --   --   --  10*  VANCORANDOM  --   --   --  21  --   --   --   --   --   --   --   --   --   --   --   --   --   --   --   --    < > = values in this interval not displayed.  Estimated Creatinine Clearance: 62.4 mL/min (by C-G formula based on SCr of 1.03 mg/dL (H)).    Allergies  Allergen Reactions  . Sulfa Antibiotics Itching    Antimicrobials this admission: Zosyn 11/15 >> 11/16, 11/21>>  Vancomycin 11/15 >> 11/16, 11/17>> -VR 11/17 @ 0500 = 21  -VT 11/22 @ 0723 = 10  Eraxis 11/21 >>   Dose adjustments this admission: 11/22 Vanc 750 mg q24h >> Vanc 1g q24h  Microbiology results: 11/15 BCx: MRSE 11/15 BCID: MRSE 11/16 MRSA PCR: neg 11/17 BCx2: ngtd  Argie Ramming, PharmD Pharmacy Resident  Pager [phone] October 04, 2016 8:06 AM

## 2016-10-03 NOTE — Progress Notes (Signed)
[facility]    Date of Admission:  10/01/2016   Total days of antibiotics 7        Day 2 anidula        Day 2 piptazo        Day 7 vanco   ID: [name] [name] is a 55 y.o. female with lupus nephritis, ESRD, found to have MV vegetation in setting of CoNS bacteremia with worsening sepsis/ metabolic acidosis on maximum pressors Principal Problem:   Tachycardia Active Problems:   Diabetes mellitus (HCC)   Fever   Elevated troponin level   Hypotension   End stage renal disease on dialysis (HCC)   Prolonged Q-T interval on ECG   Pancytopenia (HCC)   Acute on chronic respiratory failure with hypoxia (HCC)   Acute pulmonary edema (HCC)   Acute on chronic diastolic ACC/AHA stage C congestive heart failure (HCC)   Acute endocarditis   Mitral regurgitation   Severe aortic insufficiency   Acute bacterial endocarditis   AKI (acute kidney injury) (HCC)    Subjective: Remains intubated, hypotensive on 4 pressors, hypothermic, abtx broadened last night in worsening sepsis, continues on CRRT  abg reveals 7.22/22.7/10  Medications:  . sodium chloride   Intravenous Once  . sodium chloride   Intravenous Once  . sodium chloride   Intravenous Once  . albumin human  25 g Intravenous Q6H  . anidulafungin  100 mg Intravenous Q24H  . calcitRIOL  1.5 mcg Oral Q M,W,F-HD  . chlorhexidine gluconate (MEDLINE KIT)  15 mL Mouth Rinse BID  . cinacalcet  30 mg Oral Q breakfast  . famotidine (PEPCID) IV  20 mg Intravenous Q24H  . fentaNYL (SUBLIMAZE) injection  50 mcg Intravenous Once  . hydrocortisone sod succinate (SOLU-CORTEF) inj  50 mg Intravenous Q6H  . insulin aspart  2-6 Units Subcutaneous Q4H  . mouth rinse  15 mL Mouth Rinse QID  . multivitamin  1 tablet Oral QHS  . nortriptyline  10 mg Per Tube QHS  . piperacillin-tazobactam  3.375 g Intravenous Q6H  . polyethylene glycol  17 g Oral Q24H  . pregabalin  75 mg Oral QHS  . sevelamer carbonate  4,800 mg  Oral TID WC  . sodium chloride flush  3 mL Intravenous Q12H  . thiamine  100 mg Oral Daily   Or  . thiamine injection  100 mg Intravenous Daily  . vancomycin  1,000 mg Intravenous Q24H    Objective: Vital signs in last 24 hours: Temp:  [94.2 F (34.6 C)-97.5 F (36.4 C)] 95.8 F (35.4 C) (11/22 0400) Pulse Rate:  [25-115] 103 (11/22 0815) Resp:  [19-43] 21 (11/22 0830) BP: (35-72)/(16-52) 59/52 (11/22 0815) SpO2:  [54 %-100 %] 100 % (11/21 2000) Arterial Line BP: (55-137)/(42-74) 63/57 (11/22 0830) FiO2 (%):  [30 %-100 %] 100 % (11/22 0815) Weight:  [160 lb 7.9 oz (72.8 kg)] 160 lb 7.9 oz (72.8 kg) (11/22 0500) Did not examine. Family at bedside  Lab Results  Recent Labs  09/23/16 1453  2016-10-08 0000 October 08, 2016 0350 Oct 08, 2016 0558 October 08, 2016 0900  WBC  --   < > 20.2*  --  19.7*  --   HGB  --   < > 8.6* 7.7* 7.7* 7.5*  HCT  --   < > 27.4* 24.2* 24.2* 24.0*  NA 141  --   --  132*  --   --   K 4.2  --   --  4.8  --   --  CL 105  --   --  94*  --   --   CO2 18*  --   --  17*  --   --   BUN 8  --   --  <5*  --   --   CREATININE 1.15*  --   --  1.08*  1.03*  --   --   < > = values in this interval not displayed. Liver Panel  Recent Labs  09/23/16 1453 10/17/2016 0350  PROT 5.1*  --   ALBUMIN 2.0* 4.5  AST 4,910*  --   ALT 2,584*  --   ALKPHOS 141*  --   BILITOT 1.3*  --   BILIDIR 0.5  --   IBILI 0.8  --    Studies/Results: Dg Chest Port 1 View  Result Date: 10/17/16 CLINICAL DATA:  On ventilator, hypertension, endocarditis, renal failure EXAM: PORTABLE CHEST 1 VIEW COMPARISON:  Portable chest x-ray of 09/23/2016 FINDINGS: The tip of the endotracheal tube is approximately 3.6 cm above the carina. Bibasilar linear opacities are most consistent with atelectasis right greater than left but followup is recommended. No effusion is seen. Mild cardiomegaly is present. A central venous line is noted overlying the mid upper SVC. IMPRESSION: 1. Bibasilar atelectasis right  greater than left. Recommend followup. 2. Tip of endotracheal tube 3.6 cm above the carina. 3. Central venous line tip overlies the mid upper SVC. Electronically Signed   By: Ivar Drape M.D.   On: 10-17-2016 08:22   Dg Chest Port 1 View  Result Date: 09/23/2016 CLINICAL DATA:  Hypoxia EXAM: PORTABLE CHEST 1 VIEW COMPARISON:  September 22, 2016 FINDINGS: Endotracheal tube tip is 2.0 cm above the carina. Nasogastric tube tip and side port are below the diaphragm. Central catheter tip is in the superior vena cava. No pneumothorax. There is mild atelectasis in the left mid lower lung zones. Lungs elsewhere are clear. Heart size and pulmonary vascularity are normal. No adenopathy. No bone lesions. There is atherosclerotic calcification in the aorta. IMPRESSION: Tube and catheter positions as described without pneumothorax. Mild atelectasis left mid lower lung zones. No new opacity. Stable cardiac silhouette. Aortic atherosclerosis. Electronically Signed   By: Lowella Grip III M.D.   On: 09/23/2016 08:07   Dg Abd Portable 1v  Result Date: 09/23/2016 CLINICAL DATA:  Abdominal pain EXAM: PORTABLE ABDOMEN - 1 VIEW COMPARISON:  None. FINDINGS: Scattered large and small bowel gas is noted. Fecal material is noted within the colon although no obstructive changes are seen. Nasogastric catheter is noted within the stomach. Right femoral line is noted with the tip at the junction of the iliac veins. No definitive free air is seen. Diffuse aortoiliac calcifications are noted. No acute bony abnormality is noted. IMPRESSION: Tubes and lines as described. No acute abnormality seen. Electronically Signed   By: Inez Catalina M.D.   On: 09/23/2016 08:18     Assessment/Plan: Poor prognosis, family notified last night that unable to provide much more and likely to pass away today. She is DNR but not comfort care at this point. She continues on 3 abtx, 4 pressors, vent support and CVVH.  Will sign off  Baxter Flattery  [facility] Cell: [phone] Pager: [phone]  2016-10-17, 9:37 AM

## 2016-10-03 NOTE — Progress Notes (Signed)
Pts sister at bedside. Pts heart rate is slowly decreasing and BP continues to slowly decrease.   Pt actively dying. Pts sister made aware and informed her that if any family wants to be at the bedside then she could call them. She expressed understanding and called family.  Chaplin also called and made aware. Comfort cart ordered for bedside. Support given to family. Will continue to monitor and offer support.

## 2016-10-03 DEATH — deceased

## 2016-10-21 ENCOUNTER — Ambulatory Visit: Payer: Managed Care, Other (non HMO) | Admitting: Neurology

## 2017-01-16 IMAGING — CT CT ABD-PELV W/O CM
2 of 4 series · 15 of 46 positions shown, 17 images · non-contrast
Comparison: CT, 09/12/2009.

CLINICAL DATA: 53 y.o. female with past medical history of
hypertension, lupus (on plaquenil and CellCept), chronic kidney
disease-IV, recent history of intracranial hemorrhage, memberanous
glomerulonephritis, GERD, history of adrenal insufficiency, who
presents with abdominal pain, diarrhea and black stool. Initial
encounter.

EXAM:
CT ABDOMEN AND PELVIS WITHOUT CONTRAST
TECHNIQUE: Multidetector CT imaging of the abdomen and pelvis was performed
following the standard protocol without IV contrast.

[Series 2: abd/ pelvis 5.0 i30f 1 · axial · 0.78mm/px · z∈[-336,+54]mm · 12 of 90 slices shown, 14 images]
[im 8/90  soft-tissue]
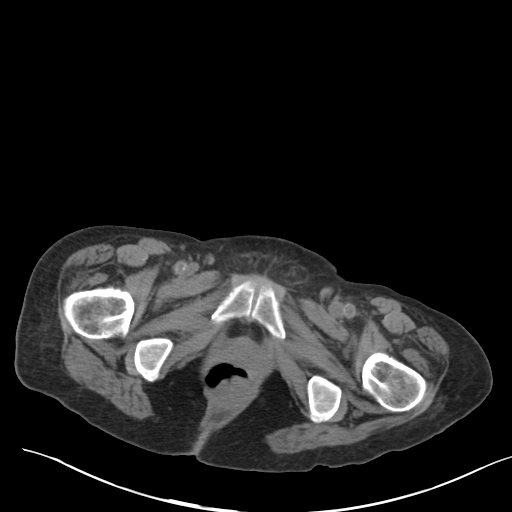
[im 8/90  bone]
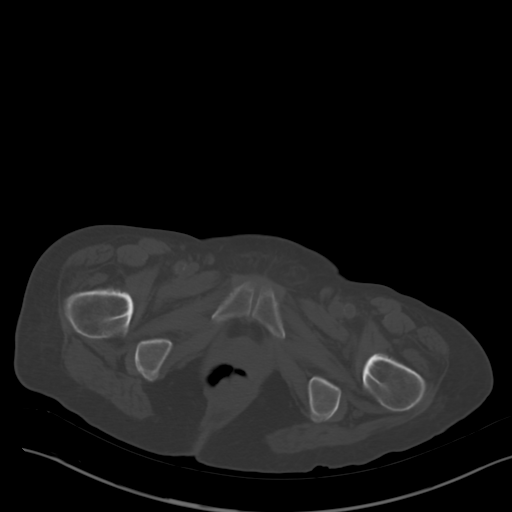
[im 15/90  soft-tissue]
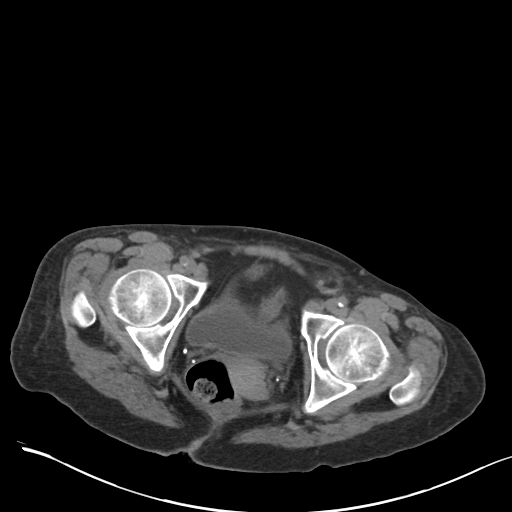
[im 22/90  soft-tissue]
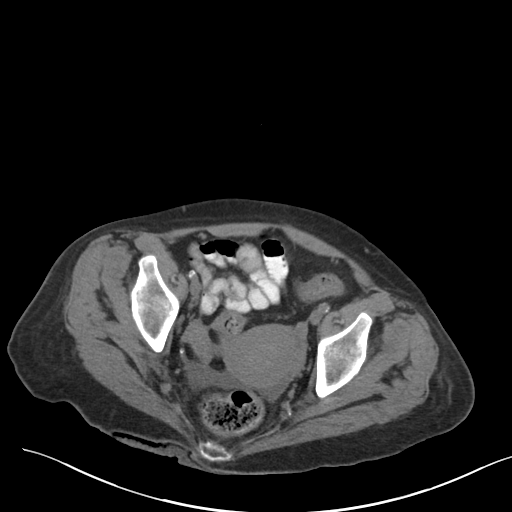
[im 29/90  soft-tissue]
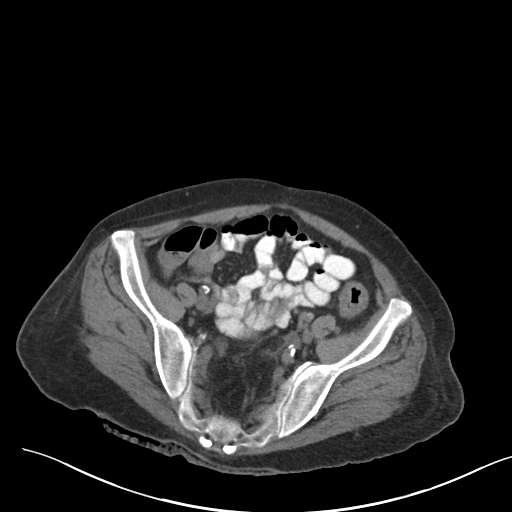
[im 36/90  soft-tissue]
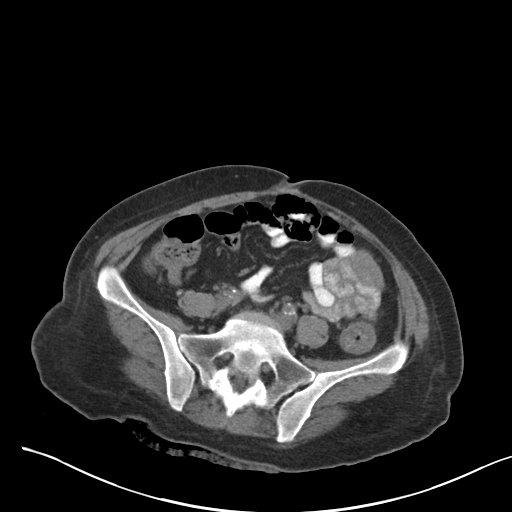
[im 43/90  soft-tissue]
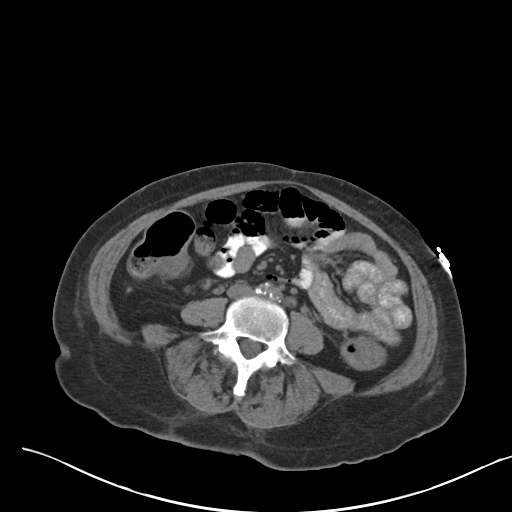
[im 50/90  soft-tissue]
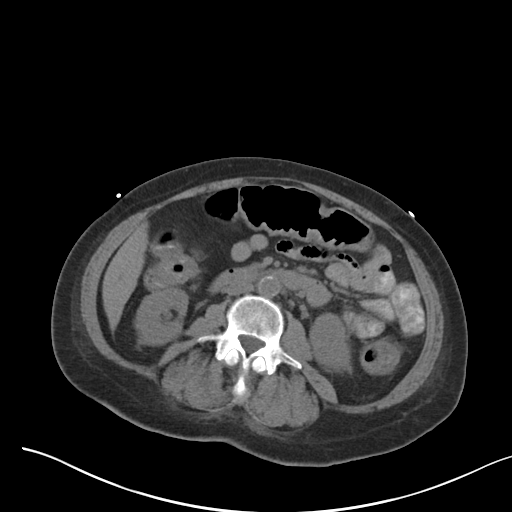
[im 57/90  soft-tissue]
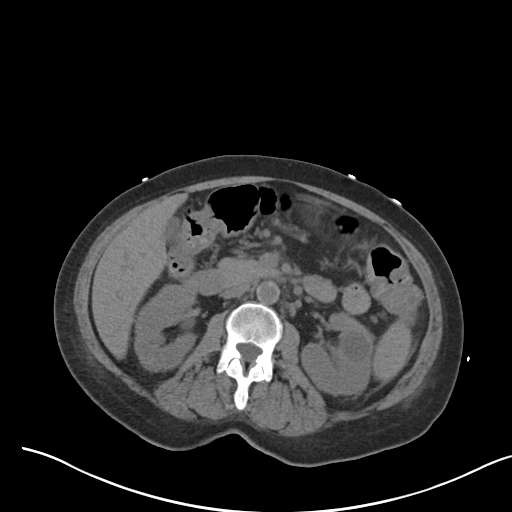
[im 65/90  soft-tissue]
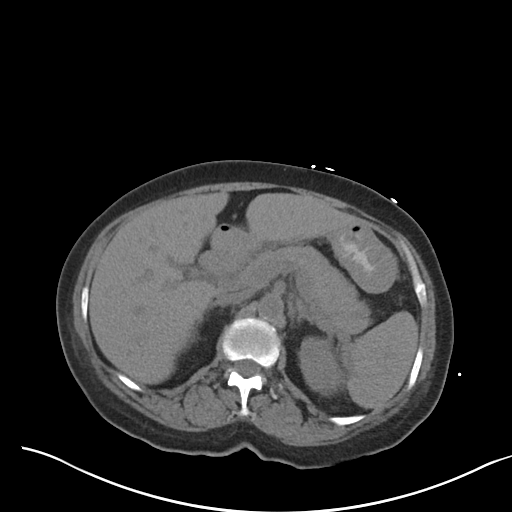
[im 65/90  bone]
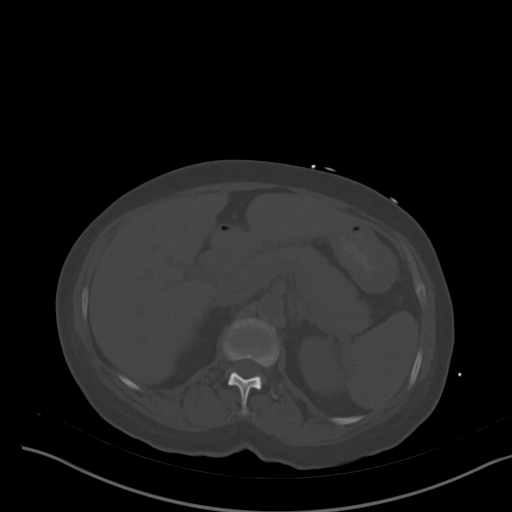
[im 72/90  soft-tissue]
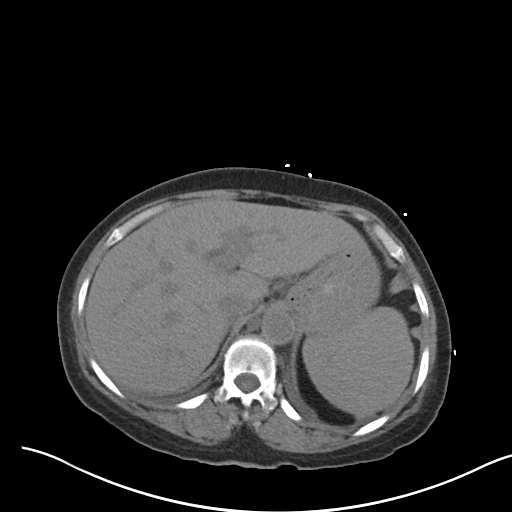
[im 79/90  soft-tissue]
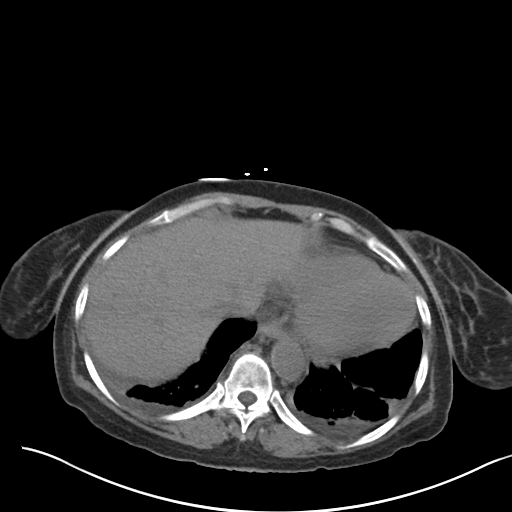
[im 86/90  soft-tissue]
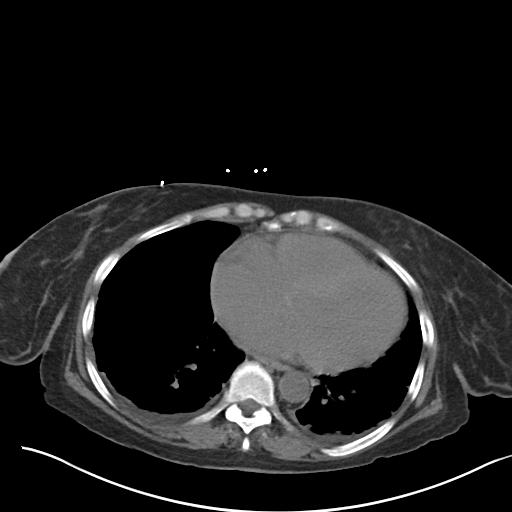

[Series 5: coronals · coronal · 0.61mm/px · 3 of 122 slices shown]
[im 41/122  soft-tissue]
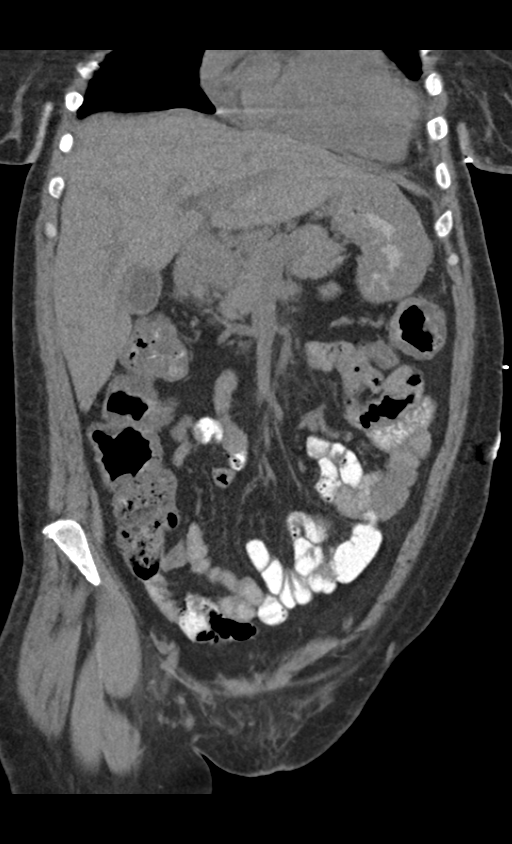
[im 54/122  soft-tissue]
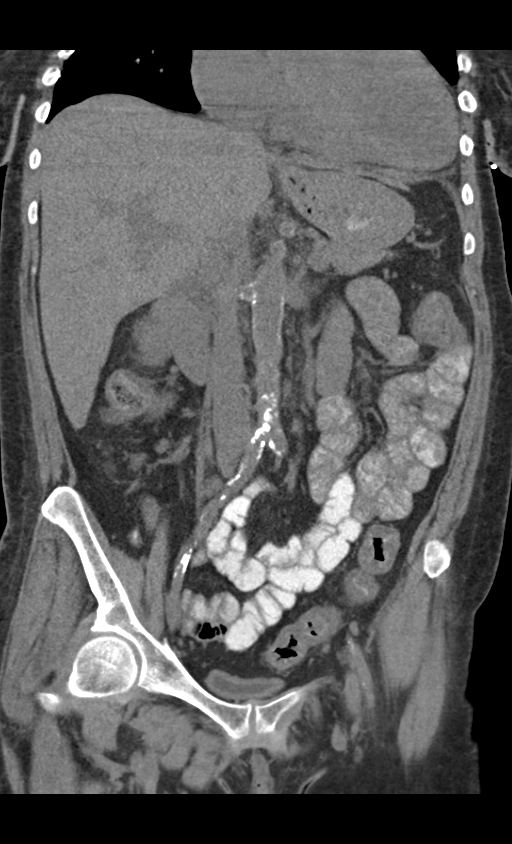
[im 68/122  soft-tissue]
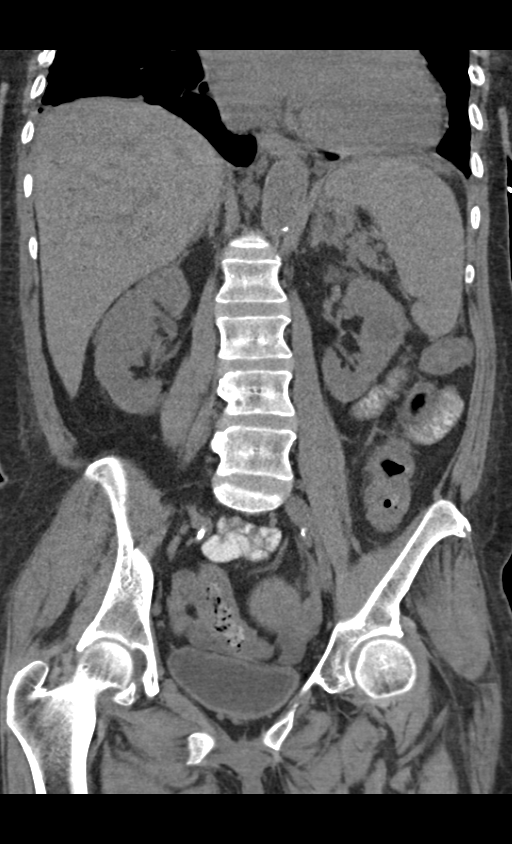

[15 of 46 positions shown; findings below may reference images not displayed]

FINDINGS: Small effusions. The heart mildly enlarged. The linear/reticular
lung base opacity is noted dependently consistent with subsegmental
atelectasis. No convincing pulmonary edema.

Liver, spleen, gallbladder, pancreas: Unremarkable. No bile duct
dilation. No adrenal masses.

2.3 cm low-density left midpole renal mass consistent with a cyst,
enlarged from the prior exam. No other renal masses, no stones and
no hydronephrosis. Ureters normal course and in caliber. Bladder is
unremarkable.

No pathologically enlarged lymph nodes.

Small amount of reach fluid in the posterior pelvic recess.

No colonic wall thickening or mesenteric inflammation. No evidence
of obstruction. No bowel dilation. Normal small bowel. Normal
appendix.

No osteoblastic or osteolytic lesions.
IMPRESSION: 1. No acute findings in the abdomen pelvis.
2. No bowel inflammatory change. No findings to explain patient's
abdominal pain or diarrhea or black stools.
3. Small amount of free fluid in the pelvis, nonspecific in this
patient with a history chronic kidney disease. Small pleural
effusions. Convincing pulmonary edema.

## 2017-01-19 IMAGING — CR DG CHEST 2V
2 series · 2 of 2 positions shown · non-contrast
Comparison: 11/06/2014

CLINICAL DATA: Initial evaluation for fever

EXAM:
CHEST  2 VIEW

[chest pa]
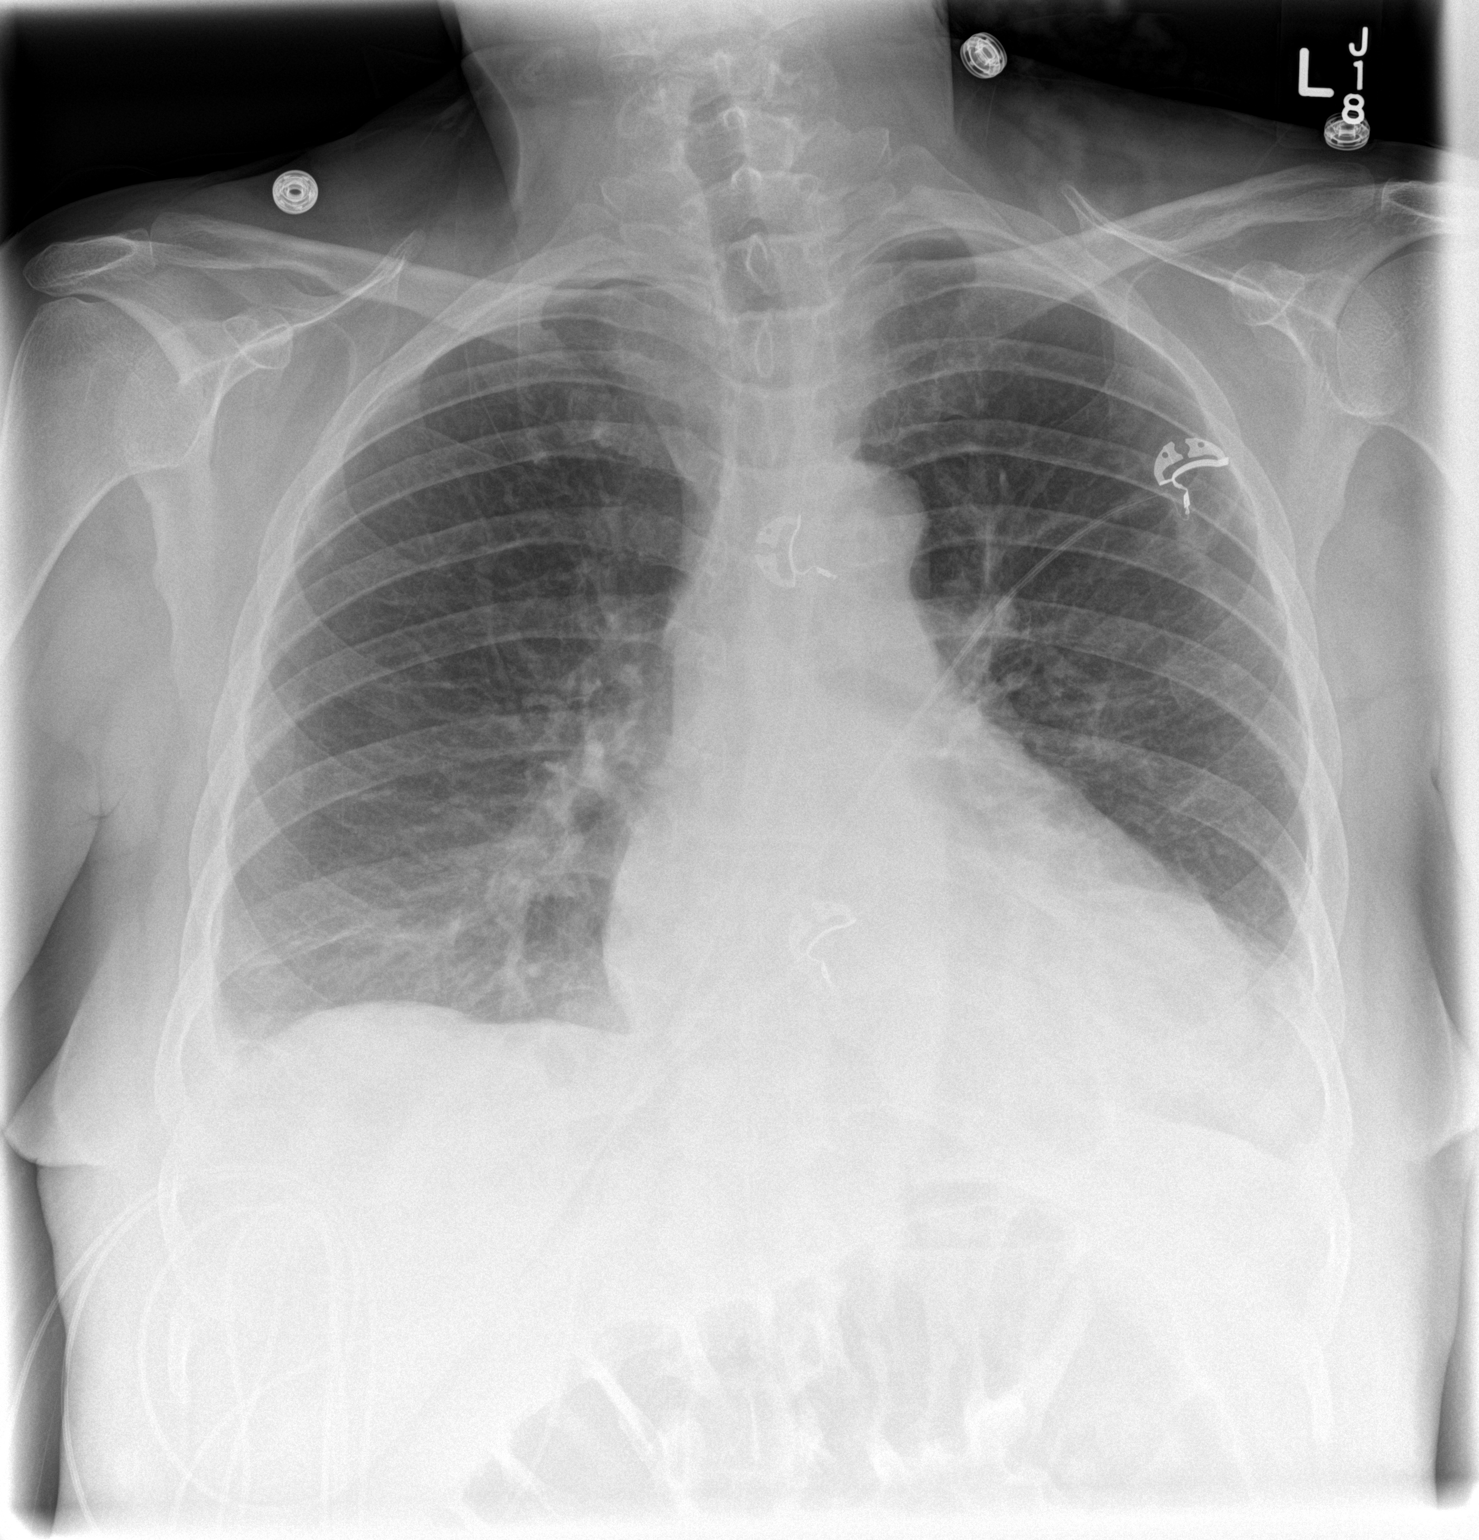

[chest lat]
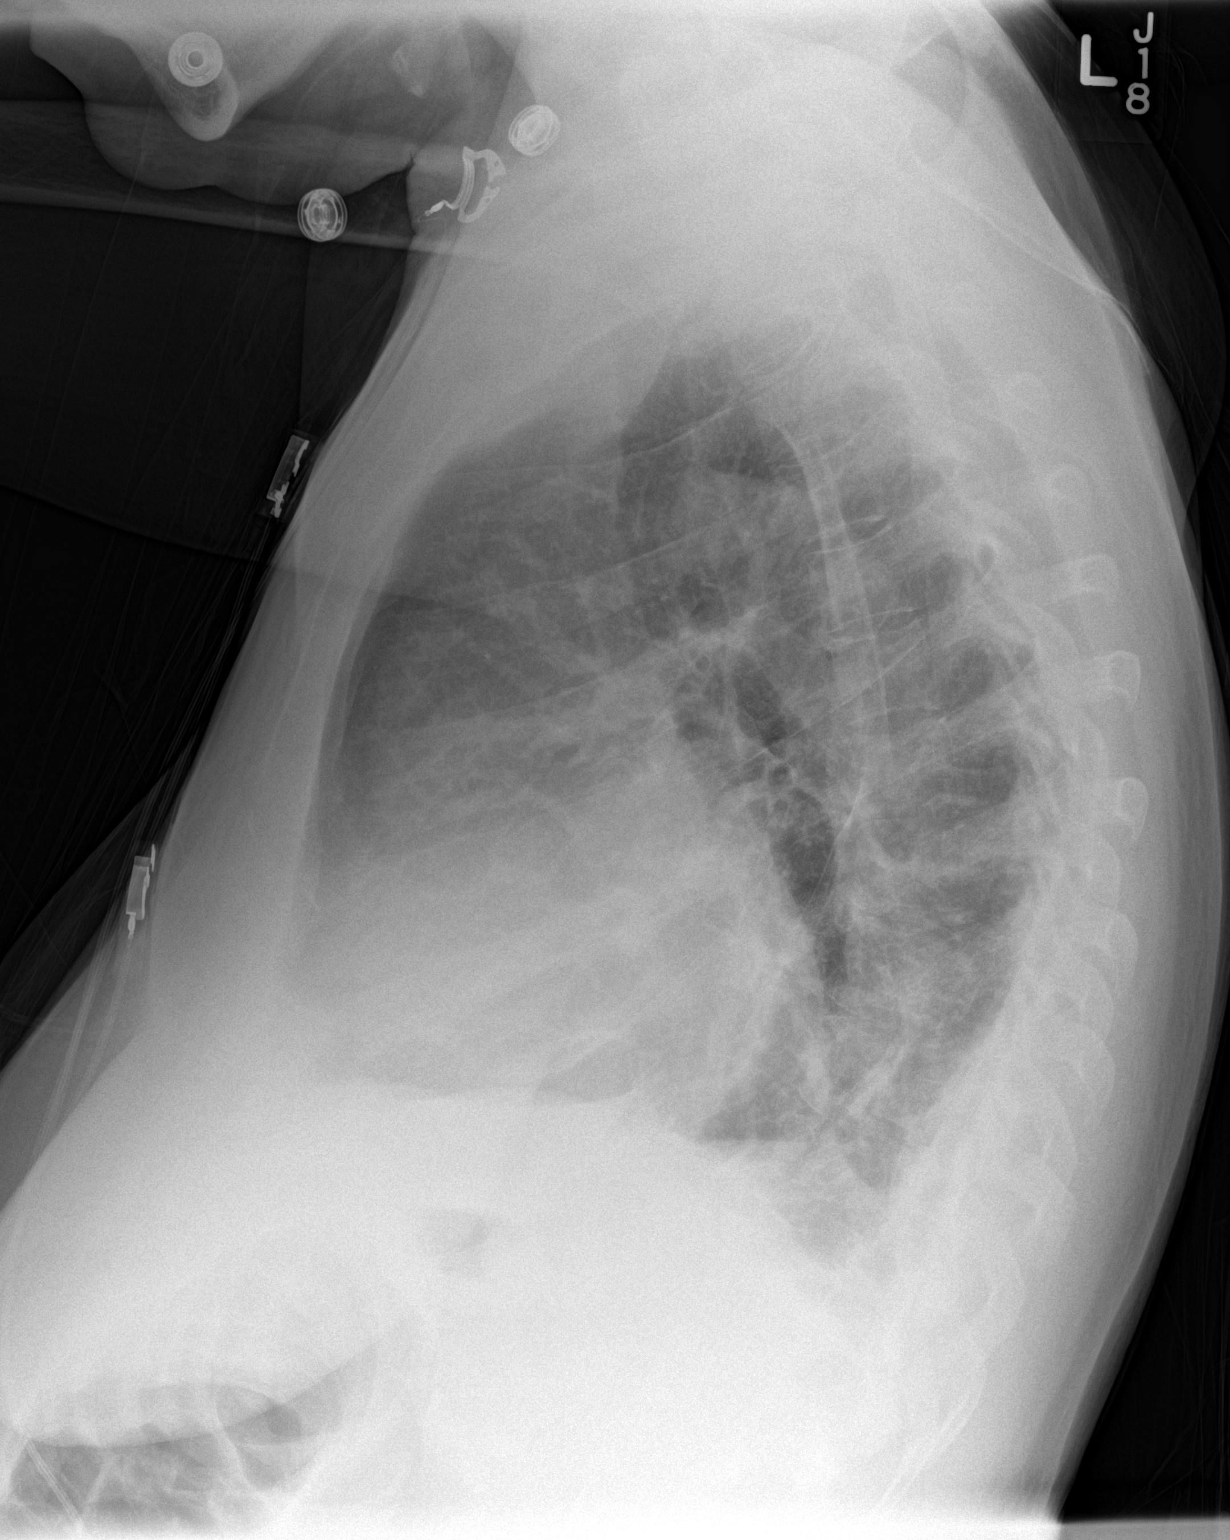

[2 of 2 positions shown; findings below may reference images not displayed]

FINDINGS: Stable mild cardiac enlargement. Vascular pattern is normal. Mild
patchy bibasilar infiltrates superior this is new from the prior
study. Minimal blunting right costophrenic angle could indicate a
tiny effusion.
IMPRESSION: New bibasilar patchy infiltrates concerning for possible pneumonia.
Tiny right pleural effusion possible.
